# Patient Record
Sex: Female | Born: 1955
Health system: Southern US, Community
[De-identification: ages and names within clinical notes are randomized; demographics above are authoritative.]

## PROBLEM LIST (undated history)

## (undated) ENCOUNTER — Emergency Department (HOSPITAL_COMMUNITY): Admission: EM | Payer: Medicare Other

## (undated) DIAGNOSIS — E119 Type 2 diabetes mellitus without complications: Secondary | ICD-10-CM

## (undated) DIAGNOSIS — K219 Gastro-esophageal reflux disease without esophagitis: Secondary | ICD-10-CM

## (undated) DIAGNOSIS — G629 Polyneuropathy, unspecified: Secondary | ICD-10-CM

## (undated) DIAGNOSIS — I890 Lymphedema, not elsewhere classified: Secondary | ICD-10-CM

## (undated) DIAGNOSIS — R109 Unspecified abdominal pain: Secondary | ICD-10-CM

## (undated) DIAGNOSIS — E785 Hyperlipidemia, unspecified: Secondary | ICD-10-CM

## (undated) DIAGNOSIS — E041 Nontoxic single thyroid nodule: Secondary | ICD-10-CM

## (undated) DIAGNOSIS — Z9884 Bariatric surgery status: Secondary | ICD-10-CM

## (undated) DIAGNOSIS — R160 Hepatomegaly, not elsewhere classified: Secondary | ICD-10-CM

## (undated) DIAGNOSIS — Z780 Asymptomatic menopausal state: Secondary | ICD-10-CM

## (undated) DIAGNOSIS — R609 Edema, unspecified: Secondary | ICD-10-CM

## (undated) DIAGNOSIS — J019 Acute sinusitis, unspecified: Secondary | ICD-10-CM

## (undated) DIAGNOSIS — D72819 Decreased white blood cell count, unspecified: Secondary | ICD-10-CM

## (undated) DIAGNOSIS — K200 Eosinophilic esophagitis: Secondary | ICD-10-CM

## (undated) DIAGNOSIS — R51 Headache: Secondary | ICD-10-CM

## (undated) DIAGNOSIS — T7840XA Allergy, unspecified, initial encounter: Secondary | ICD-10-CM

## (undated) DIAGNOSIS — C801 Malignant (primary) neoplasm, unspecified: Secondary | ICD-10-CM

## (undated) DIAGNOSIS — Z9189 Other specified personal risk factors, not elsewhere classified: Secondary | ICD-10-CM

## (undated) DIAGNOSIS — E890 Postprocedural hypothyroidism: Secondary | ICD-10-CM

## (undated) DIAGNOSIS — G609 Hereditary and idiopathic neuropathy, unspecified: Secondary | ICD-10-CM

## (undated) DIAGNOSIS — M1990 Unspecified osteoarthritis, unspecified site: Secondary | ICD-10-CM

## (undated) DIAGNOSIS — H269 Unspecified cataract: Secondary | ICD-10-CM

## (undated) DIAGNOSIS — I10 Essential (primary) hypertension: Secondary | ICD-10-CM

## (undated) DIAGNOSIS — E669 Obesity, unspecified: Secondary | ICD-10-CM

## (undated) DIAGNOSIS — I2510 Atherosclerotic heart disease of native coronary artery without angina pectoris: Secondary | ICD-10-CM

## (undated) DIAGNOSIS — F329 Major depressive disorder, single episode, unspecified: Secondary | ICD-10-CM

## (undated) DIAGNOSIS — R0602 Shortness of breath: Secondary | ICD-10-CM

## (undated) DIAGNOSIS — N3000 Acute cystitis without hematuria: Secondary | ICD-10-CM

## (undated) DIAGNOSIS — N289 Disorder of kidney and ureter, unspecified: Secondary | ICD-10-CM

## (undated) DIAGNOSIS — R509 Fever, unspecified: Secondary | ICD-10-CM

## (undated) DIAGNOSIS — J309 Allergic rhinitis, unspecified: Secondary | ICD-10-CM

## (undated) DIAGNOSIS — K7689 Other specified diseases of liver: Secondary | ICD-10-CM

## (undated) HISTORY — DX: Other specified diseases of liver: K76.89

## (undated) HISTORY — DX: Hepatomegaly, not elsewhere classified: R16.0

## (undated) HISTORY — PX: LYMPH NODE DISSECTION: SHX5087

## (undated) HISTORY — PX: KNEE ARTHROSCOPY: SHX127

## (undated) HISTORY — DX: Unspecified cataract: H26.9

## (undated) HISTORY — DX: Postprocedural hypothyroidism: E89.0

## (undated) HISTORY — DX: Asymptomatic menopausal state: Z78.0

## (undated) HISTORY — DX: Eosinophilic esophagitis: K20.0

## (undated) HISTORY — DX: Gastro-esophageal reflux disease without esophagitis: K21.9

## (undated) HISTORY — DX: Obesity, unspecified: E66.9

## (undated) HISTORY — DX: Hereditary and idiopathic neuropathy, unspecified: G60.9

## (undated) HISTORY — DX: Unspecified abdominal pain: R10.9

## (undated) HISTORY — DX: Essential (primary) hypertension: I10

## (undated) HISTORY — PX: UPPER GASTROINTESTINAL ENDOSCOPY: SHX188

## (undated) HISTORY — DX: Acute cystitis without hematuria: N30.00

## (undated) HISTORY — DX: Headache: R51

## (undated) HISTORY — PX: BASAL CELL CARCINOMA EXCISION: SHX1214

## (undated) HISTORY — PX: OOPHORECTOMY: SHX86

## (undated) HISTORY — DX: Lymphedema, not elsewhere classified: I89.0

## (undated) HISTORY — DX: Shortness of breath: R06.02

## (undated) HISTORY — DX: Bariatric surgery status: Z98.84

## (undated) HISTORY — PX: OTHER SURGICAL HISTORY: SHX169

## (undated) HISTORY — PX: EYE SURGERY: SHX253

## (undated) HISTORY — DX: Type 2 diabetes mellitus without complications: E11.9

## (undated) HISTORY — DX: Major depressive disorder, single episode, unspecified: F32.9

## (undated) HISTORY — DX: Allergy, unspecified, initial encounter: T78.40XA

## (undated) HISTORY — DX: Unspecified osteoarthritis, unspecified site: M19.90

## (undated) HISTORY — DX: Polyneuropathy, unspecified: G62.9

## (undated) HISTORY — DX: Acute sinusitis, unspecified: J01.90

## (undated) HISTORY — DX: Nontoxic single thyroid nodule: E04.1

## (undated) HISTORY — PX: ABDOMINAL HYSTERECTOMY: SHX81

## (undated) HISTORY — PX: COLONOSCOPY: SHX174

## (undated) HISTORY — DX: Malignant (primary) neoplasm, unspecified: C80.1

## (undated) HISTORY — DX: Edema, unspecified: R60.9

## (undated) HISTORY — DX: Disorder of kidney and ureter, unspecified: N28.9

## (undated) HISTORY — DX: Hyperlipidemia, unspecified: E78.5

## (undated) HISTORY — DX: Other specified personal risk factors, not elsewhere classified: Z91.89

## (undated) HISTORY — PX: TONSILLECTOMY AND ADENOIDECTOMY: SUR1326

## (undated) HISTORY — PX: FOOT SURGERY: SHX648

## (undated) HISTORY — DX: Allergic rhinitis, unspecified: J30.9

## (undated) HISTORY — PX: THYROIDECTOMY: SHX17

## (undated) HISTORY — DX: Decreased white blood cell count, unspecified: D72.819

## (undated) HISTORY — PX: SKIN TAG REMOVAL: SHX780

## (undated) HISTORY — DX: Fever, unspecified: R50.9

## (undated) HISTORY — PX: CARDIAC CATHETERIZATION: SHX172

---

## 2001-12-11 ENCOUNTER — Encounter: Payer: Self-pay | Admitting: Gastroenterology

## 2007-03-16 ENCOUNTER — Emergency Department (HOSPITAL_COMMUNITY): Admission: EM | Admit: 2007-03-16 | Discharge: 2007-03-16 | Payer: Self-pay | Admitting: Emergency Medicine

## 2007-07-04 ENCOUNTER — Ambulatory Visit: Payer: Self-pay | Admitting: Endocrinology

## 2007-07-04 LAB — CONVERTED CEMR LAB
ALT: 112 units/L — ABNORMAL HIGH (ref 0–35)
AST: 67 units/L — ABNORMAL HIGH (ref 0–37)
Albumin: 3.6 g/dL (ref 3.5–5.2)
Alkaline Phosphatase: 124 units/L — ABNORMAL HIGH (ref 39–117)
BUN: 16 mg/dL (ref 6–23)
Basophils Absolute: 0 10*3/uL (ref 0.0–0.1)
Basophils Relative: 0.9 % (ref 0.0–1.0)
Bilirubin, Direct: 0.1 mg/dL (ref 0.0–0.3)
CO2: 28 meq/L (ref 19–32)
Calcium: 9.2 mg/dL (ref 8.4–10.5)
Chloride: 101 meq/L (ref 96–112)
Cholesterol: 265 mg/dL (ref 0–200)
Creatinine, Ser: 0.7 mg/dL (ref 0.4–1.2)
Direct LDL: 181.4 mg/dL
Eosinophils Absolute: 0.3 10*3/uL (ref 0.0–0.6)
Eosinophils Relative: 9.8 % — ABNORMAL HIGH (ref 0.0–5.0)
GFR calc Af Amer: 113 mL/min
GFR calc non Af Amer: 93 mL/min
Glucose, Bld: 264 mg/dL — ABNORMAL HIGH (ref 70–99)
HCT: 32.3 % — ABNORMAL LOW (ref 36.0–46.0)
HDL: 27.1 mg/dL — ABNORMAL LOW (ref >39.0)
Hemoglobin: 11 g/dL — ABNORMAL LOW (ref 12.0–15.0)
Hgb A1c MFr Bld: 11.8 % — ABNORMAL HIGH (ref 4.6–6.0)
Lymphocytes Relative: 24.6 % (ref 12.0–46.0)
MCHC: 34 g/dL (ref 30.0–36.0)
MCV: 82.8 fL (ref 78.0–100.0)
Monocytes Absolute: 0.3 10*3/uL (ref 0.2–0.7)
Monocytes Relative: 8.6 % (ref 3.0–11.0)
Neutro Abs: 1.7 10*3/uL (ref 1.4–7.7)
Neutrophils Relative %: 56.1 % (ref 43.0–77.0)
Platelets: 193 10*3/uL (ref 150–400)
Potassium: 3.8 meq/L (ref 3.5–5.1)
RBC: 3.91 M/uL (ref 3.87–5.11)
RDW: 14 % (ref 11.5–14.6)
Sodium: 137 meq/L (ref 135–145)
TSH: 1.2 microintl units/mL (ref 0.35–5.50)
Total Bilirubin: 0.7 mg/dL (ref 0.3–1.2)
Total CHOL/HDL Ratio: 9.8
Total Protein: 6.3 g/dL (ref 6.0–8.3)
Triglycerides: 239 mg/dL (ref 0–149)
VLDL: 48 mg/dL — ABNORMAL HIGH (ref 0–40)
WBC: 3.1 10*3/uL — ABNORMAL LOW (ref 4.5–10.5)

## 2007-07-08 ENCOUNTER — Encounter: Payer: Self-pay | Admitting: Endocrinology

## 2007-07-08 DIAGNOSIS — I10 Essential (primary) hypertension: Secondary | ICD-10-CM

## 2007-07-08 DIAGNOSIS — E785 Hyperlipidemia, unspecified: Secondary | ICD-10-CM

## 2007-07-08 DIAGNOSIS — E1140 Type 2 diabetes mellitus with diabetic neuropathy, unspecified: Secondary | ICD-10-CM | POA: Insufficient documentation

## 2007-07-08 DIAGNOSIS — E1169 Type 2 diabetes mellitus with other specified complication: Secondary | ICD-10-CM | POA: Insufficient documentation

## 2007-07-08 DIAGNOSIS — E119 Type 2 diabetes mellitus without complications: Secondary | ICD-10-CM

## 2007-07-08 HISTORY — DX: Hyperlipidemia, unspecified: E78.5

## 2007-07-08 HISTORY — DX: Essential (primary) hypertension: I10

## 2007-07-08 HISTORY — DX: Type 2 diabetes mellitus without complications: E11.9

## 2007-07-11 ENCOUNTER — Encounter: Admission: RE | Admit: 2007-07-11 | Discharge: 2007-07-11 | Payer: Self-pay | Admitting: Endocrinology

## 2007-07-25 ENCOUNTER — Encounter: Admission: RE | Admit: 2007-07-25 | Discharge: 2007-07-25 | Payer: Self-pay | Admitting: Endocrinology

## 2007-07-31 ENCOUNTER — Encounter: Payer: Self-pay | Admitting: *Deleted

## 2007-08-01 ENCOUNTER — Ambulatory Visit: Payer: Self-pay | Admitting: Endocrinology

## 2007-08-15 ENCOUNTER — Ambulatory Visit: Payer: Self-pay | Admitting: Endocrinology

## 2007-08-29 ENCOUNTER — Ambulatory Visit: Payer: Self-pay | Admitting: Endocrinology

## 2007-08-29 ENCOUNTER — Telehealth (INDEPENDENT_AMBULATORY_CARE_PROVIDER_SITE_OTHER): Payer: Self-pay | Admitting: *Deleted

## 2007-09-12 ENCOUNTER — Ambulatory Visit: Payer: Self-pay | Admitting: Gastroenterology

## 2007-10-03 ENCOUNTER — Encounter: Admission: RE | Admit: 2007-10-03 | Discharge: 2007-10-03 | Payer: Self-pay | Admitting: Orthopedic Surgery

## 2007-10-03 ENCOUNTER — Ambulatory Visit: Payer: Self-pay | Admitting: Endocrinology

## 2007-11-21 ENCOUNTER — Ambulatory Visit: Payer: Self-pay | Admitting: Internal Medicine

## 2007-11-21 DIAGNOSIS — J309 Allergic rhinitis, unspecified: Secondary | ICD-10-CM

## 2007-11-21 DIAGNOSIS — F329 Major depressive disorder, single episode, unspecified: Secondary | ICD-10-CM

## 2007-11-21 DIAGNOSIS — J019 Acute sinusitis, unspecified: Secondary | ICD-10-CM | POA: Insufficient documentation

## 2007-11-21 DIAGNOSIS — E1140 Type 2 diabetes mellitus with diabetic neuropathy, unspecified: Secondary | ICD-10-CM | POA: Insufficient documentation

## 2007-11-21 DIAGNOSIS — F3289 Other specified depressive episodes: Secondary | ICD-10-CM

## 2007-11-21 DIAGNOSIS — F325 Major depressive disorder, single episode, in full remission: Secondary | ICD-10-CM | POA: Insufficient documentation

## 2007-11-21 DIAGNOSIS — G609 Hereditary and idiopathic neuropathy, unspecified: Secondary | ICD-10-CM

## 2007-11-21 HISTORY — DX: Acute sinusitis, unspecified: J01.90

## 2007-11-21 HISTORY — DX: Other specified depressive episodes: F32.89

## 2007-11-21 HISTORY — DX: Hereditary and idiopathic neuropathy, unspecified: G60.9

## 2007-11-21 HISTORY — DX: Allergic rhinitis, unspecified: J30.9

## 2007-11-21 HISTORY — DX: Major depressive disorder, single episode, unspecified: F32.9

## 2007-11-24 ENCOUNTER — Encounter: Payer: Self-pay | Admitting: Endocrinology

## 2007-12-26 ENCOUNTER — Ambulatory Visit: Payer: Self-pay | Admitting: Endocrinology

## 2008-01-02 ENCOUNTER — Encounter: Payer: Self-pay | Admitting: Endocrinology

## 2008-01-06 DIAGNOSIS — M19041 Primary osteoarthritis, right hand: Secondary | ICD-10-CM | POA: Insufficient documentation

## 2008-01-06 DIAGNOSIS — M19042 Primary osteoarthritis, left hand: Secondary | ICD-10-CM | POA: Insufficient documentation

## 2008-01-06 DIAGNOSIS — E669 Obesity, unspecified: Secondary | ICD-10-CM | POA: Insufficient documentation

## 2008-01-06 DIAGNOSIS — R160 Hepatomegaly, not elsewhere classified: Secondary | ICD-10-CM | POA: Insufficient documentation

## 2008-01-06 DIAGNOSIS — K7689 Other specified diseases of liver: Secondary | ICD-10-CM

## 2008-01-06 DIAGNOSIS — M1990 Unspecified osteoarthritis, unspecified site: Secondary | ICD-10-CM

## 2008-01-06 DIAGNOSIS — K7581 Nonalcoholic steatohepatitis (NASH): Secondary | ICD-10-CM | POA: Insufficient documentation

## 2008-01-06 HISTORY — DX: Obesity, unspecified: E66.9

## 2008-01-06 HISTORY — DX: Other specified diseases of liver: K76.89

## 2008-01-06 HISTORY — DX: Unspecified osteoarthritis, unspecified site: M19.90

## 2008-01-06 HISTORY — DX: Hepatomegaly, not elsewhere classified: R16.0

## 2008-01-09 ENCOUNTER — Ambulatory Visit: Payer: Self-pay | Admitting: Endocrinology

## 2008-01-20 LAB — CONVERTED CEMR LAB
BUN: 20 mg/dL (ref 6–23)
CO2: 27 meq/L (ref 19–32)
Calcium: 9.4 mg/dL (ref 8.4–10.5)
Chloride: 98 meq/L (ref 96–112)
Creatinine, Ser: 0.8 mg/dL (ref 0.4–1.2)
GFR calc Af Amer: 96 mL/min
GFR calc non Af Amer: 80 mL/min
Glucose, Bld: 404 mg/dL — ABNORMAL HIGH (ref 70–99)
Potassium: 4.5 meq/L (ref 3.5–5.1)
Sodium: 134 meq/L — ABNORMAL LOW (ref 135–145)

## 2008-02-13 ENCOUNTER — Ambulatory Visit: Payer: Self-pay | Admitting: Endocrinology

## 2008-02-13 DIAGNOSIS — K200 Eosinophilic esophagitis: Secondary | ICD-10-CM

## 2008-02-13 HISTORY — DX: Eosinophilic esophagitis: K20.0

## 2008-03-15 ENCOUNTER — Encounter: Payer: Self-pay | Admitting: Endocrinology

## 2008-03-26 ENCOUNTER — Telehealth: Payer: Self-pay | Admitting: Gastroenterology

## 2008-03-26 ENCOUNTER — Ambulatory Visit: Payer: Self-pay | Admitting: Gastroenterology

## 2008-04-08 ENCOUNTER — Encounter: Payer: Self-pay | Admitting: Gastroenterology

## 2008-04-08 LAB — HM COLONOSCOPY: HM Colonoscopy: NORMAL

## 2008-04-09 ENCOUNTER — Encounter (INDEPENDENT_AMBULATORY_CARE_PROVIDER_SITE_OTHER): Payer: Self-pay | Admitting: Obstetrics and Gynecology

## 2008-04-09 ENCOUNTER — Ambulatory Visit (HOSPITAL_COMMUNITY): Admission: RE | Admit: 2008-04-09 | Discharge: 2008-04-09 | Payer: Self-pay | Admitting: Obstetrics and Gynecology

## 2008-05-19 ENCOUNTER — Emergency Department (HOSPITAL_BASED_OUTPATIENT_CLINIC_OR_DEPARTMENT_OTHER): Admission: EM | Admit: 2008-05-19 | Discharge: 2008-05-19 | Payer: Self-pay | Admitting: Emergency Medicine

## 2008-05-19 ENCOUNTER — Telehealth (INDEPENDENT_AMBULATORY_CARE_PROVIDER_SITE_OTHER): Payer: Self-pay | Admitting: *Deleted

## 2008-05-20 ENCOUNTER — Ambulatory Visit: Payer: Self-pay | Admitting: Endocrinology

## 2008-05-21 ENCOUNTER — Telehealth (INDEPENDENT_AMBULATORY_CARE_PROVIDER_SITE_OTHER): Payer: Self-pay | Admitting: *Deleted

## 2008-07-12 ENCOUNTER — Telehealth (INDEPENDENT_AMBULATORY_CARE_PROVIDER_SITE_OTHER): Payer: Self-pay | Admitting: *Deleted

## 2008-07-14 ENCOUNTER — Telehealth (INDEPENDENT_AMBULATORY_CARE_PROVIDER_SITE_OTHER): Payer: Self-pay | Admitting: *Deleted

## 2008-07-16 ENCOUNTER — Ambulatory Visit: Payer: Self-pay | Admitting: Endocrinology

## 2008-07-30 ENCOUNTER — Ambulatory Visit: Payer: Self-pay | Admitting: Endocrinology

## 2008-08-13 ENCOUNTER — Ambulatory Visit (HOSPITAL_COMMUNITY): Admission: RE | Admit: 2008-08-13 | Discharge: 2008-08-13 | Payer: Self-pay | Admitting: Obstetrics and Gynecology

## 2008-08-30 ENCOUNTER — Telehealth (INDEPENDENT_AMBULATORY_CARE_PROVIDER_SITE_OTHER): Payer: Self-pay | Admitting: *Deleted

## 2008-09-14 ENCOUNTER — Telehealth (INDEPENDENT_AMBULATORY_CARE_PROVIDER_SITE_OTHER): Payer: Self-pay | Admitting: *Deleted

## 2008-09-27 ENCOUNTER — Ambulatory Visit: Payer: Self-pay | Admitting: Endocrinology

## 2008-09-29 ENCOUNTER — Ambulatory Visit: Payer: Self-pay | Admitting: Endocrinology

## 2008-09-29 DIAGNOSIS — D72819 Decreased white blood cell count, unspecified: Secondary | ICD-10-CM

## 2008-09-29 DIAGNOSIS — Z780 Asymptomatic menopausal state: Secondary | ICD-10-CM

## 2008-09-29 HISTORY — DX: Decreased white blood cell count, unspecified: D72.819

## 2008-09-29 HISTORY — DX: Asymptomatic menopausal state: Z78.0

## 2008-09-29 LAB — CONVERTED CEMR LAB
ALT: 145 units/L — ABNORMAL HIGH (ref 0–35)
AST: 105 units/L — ABNORMAL HIGH (ref 0–37)
Albumin: 3.5 g/dL (ref 3.5–5.2)
Alkaline Phosphatase: 97 units/L (ref 39–117)
BUN: 12 mg/dL (ref 6–23)
Basophils Absolute: 0 10*3/uL (ref 0.0–0.1)
Basophils Relative: 0.4 % (ref 0.0–3.0)
Bilirubin Urine: NEGATIVE
Bilirubin, Direct: 0.1 mg/dL (ref 0.0–0.3)
CO2: 30 meq/L (ref 19–32)
Calcium: 9.2 mg/dL (ref 8.4–10.5)
Chloride: 102 meq/L (ref 96–112)
Cholesterol: 264 mg/dL (ref 0–200)
Creatinine, Ser: 0.8 mg/dL (ref 0.4–1.2)
Creatinine,U: 57.6 mg/dL
Direct LDL: 185.3 mg/dL
Eosinophils Absolute: 0.1 10*3/uL (ref 0.0–0.7)
Eosinophils Relative: 4 % (ref 0.0–5.0)
GFR calc Af Amer: 96 mL/min
GFR calc non Af Amer: 79 mL/min
Glucose, Bld: 143 mg/dL — ABNORMAL HIGH (ref 70–99)
HCT: 36.1 % (ref 36.0–46.0)
HDL: 27.2 mg/dL — ABNORMAL LOW (ref >39.0)
Hemoglobin, Urine: NEGATIVE
Hemoglobin: 12.5 g/dL (ref 12.0–15.0)
Hgb A1c MFr Bld: 8.4 % — ABNORMAL HIGH (ref 4.6–6.0)
Iron: 65 ug/dL (ref 42–145)
Ketones, ur: NEGATIVE mg/dL
Leukocytes, UA: NEGATIVE
Lymphocytes Relative: 27 % (ref 12.0–46.0)
MCHC: 34.5 g/dL (ref 30.0–36.0)
MCV: 91 fL (ref 78.0–100.0)
Microalb Creat Ratio: 6.9 mg/g (ref 0.0–30.0)
Microalb, Ur: 0.4 mg/dL (ref 0.0–1.9)
Monocytes Absolute: 0.4 10*3/uL (ref 0.1–1.0)
Monocytes Relative: 10 % (ref 3.0–12.0)
Neutro Abs: 2.1 10*3/uL (ref 1.4–7.7)
Neutrophils Relative %: 58.6 % (ref 43.0–77.0)
Nitrite: NEGATIVE
Platelets: 137 10*3/uL — ABNORMAL LOW (ref 150–400)
Potassium: 4.3 meq/L (ref 3.5–5.1)
RBC: 3.97 M/uL (ref 3.87–5.11)
RDW: 13.1 % (ref 11.5–14.6)
Saturation Ratios: 15.7 % — ABNORMAL LOW (ref 20.0–50.0)
Sodium: 139 meq/L (ref 135–145)
Specific Gravity, Urine: 1.01 (ref 1.000–1.03)
TSH: 1.51 microintl units/mL (ref 0.35–5.50)
Total Bilirubin: 0.7 mg/dL (ref 0.3–1.2)
Total CHOL/HDL Ratio: 9.7
Total Protein, Urine: NEGATIVE mg/dL
Total Protein: 6.2 g/dL (ref 6.0–8.3)
Transferrin: 296.5 mg/dL (ref >=212.0)
Triglycerides: 157 mg/dL — ABNORMAL HIGH (ref 0–149)
Urine Glucose: NEGATIVE mg/dL
Urobilinogen, UA: 0.2 (ref 0.0–1.0)
VLDL: 31 mg/dL (ref 0–40)
Vitamin B-12: >1500 pg/mL — ABNORMAL HIGH (ref 211–911)
WBC: 3.6 10*3/uL — ABNORMAL LOW (ref 4.5–10.5)
pH: 6 (ref 5.0–8.0)

## 2008-09-30 ENCOUNTER — Encounter: Payer: Self-pay | Admitting: Endocrinology

## 2008-10-18 ENCOUNTER — Inpatient Hospital Stay (HOSPITAL_COMMUNITY): Admission: RE | Admit: 2008-10-18 | Discharge: 2008-10-22 | Payer: Self-pay | Admitting: Orthopedic Surgery

## 2008-11-15 ENCOUNTER — Telehealth (INDEPENDENT_AMBULATORY_CARE_PROVIDER_SITE_OTHER): Payer: Self-pay | Admitting: *Deleted

## 2008-12-09 ENCOUNTER — Ambulatory Visit: Payer: Self-pay | Admitting: Endocrinology

## 2008-12-31 ENCOUNTER — Encounter: Payer: Self-pay | Admitting: Endocrinology

## 2009-01-07 ENCOUNTER — Ambulatory Visit: Payer: Self-pay | Admitting: Internal Medicine

## 2009-01-07 ENCOUNTER — Encounter: Payer: Self-pay | Admitting: Endocrinology

## 2009-01-14 ENCOUNTER — Telehealth: Payer: Self-pay | Admitting: Endocrinology

## 2009-02-10 ENCOUNTER — Telehealth (INDEPENDENT_AMBULATORY_CARE_PROVIDER_SITE_OTHER): Payer: Self-pay | Admitting: *Deleted

## 2009-02-18 ENCOUNTER — Ambulatory Visit: Payer: Self-pay | Admitting: Endocrinology

## 2009-05-20 ENCOUNTER — Ambulatory Visit: Payer: Self-pay | Admitting: Endocrinology

## 2009-05-20 DIAGNOSIS — R0602 Shortness of breath: Secondary | ICD-10-CM | POA: Insufficient documentation

## 2009-05-20 DIAGNOSIS — R609 Edema, unspecified: Secondary | ICD-10-CM

## 2009-05-20 DIAGNOSIS — R635 Abnormal weight gain: Secondary | ICD-10-CM | POA: Insufficient documentation

## 2009-05-20 HISTORY — DX: Edema, unspecified: R60.9

## 2009-05-20 HISTORY — DX: Shortness of breath: R06.02

## 2009-05-20 LAB — CONVERTED CEMR LAB
ALT: 66 units/L — ABNORMAL HIGH (ref 0–35)
AST: 56 units/L — ABNORMAL HIGH (ref 0–37)
Albumin: 3.9 g/dL (ref 3.5–5.2)
Alkaline Phosphatase: 103 units/L (ref 39–117)
BUN: 23 mg/dL (ref 6–23)
Bilirubin, Direct: 0.1 mg/dL (ref 0.0–0.3)
CO2: 29 meq/L (ref 19–32)
Calcium: 9.3 mg/dL (ref 8.4–10.5)
Chloride: 102 meq/L (ref 96–112)
Cholesterol: 303 mg/dL — ABNORMAL HIGH (ref 0–200)
Creatinine, Ser: 1 mg/dL (ref 0.4–1.2)
Direct LDL: 224.9 mg/dL
GFR calc non Af Amer: 61.17 mL/min (ref >60)
Glucose, Bld: 179 mg/dL — ABNORMAL HIGH (ref 70–99)
HDL: 33.1 mg/dL — ABNORMAL LOW (ref >39.00)
Hgb A1c MFr Bld: 8.5 % — ABNORMAL HIGH (ref 4.6–6.5)
Potassium: 4 meq/L (ref 3.5–5.1)
Pro B Natriuretic peptide (BNP): 12 pg/mL (ref 0.0–100.0)
Sodium: 138 meq/L (ref 135–145)
TSH: 1.55 microintl units/mL (ref 0.35–5.50)
Total Bilirubin: 0.9 mg/dL (ref 0.3–1.2)
Total CHOL/HDL Ratio: 9
Total Protein: 6.8 g/dL (ref 6.0–8.3)
Triglycerides: 215 mg/dL — ABNORMAL HIGH (ref 0.0–149.0)
VLDL: 43 mg/dL — ABNORMAL HIGH (ref 0.0–40.0)

## 2009-05-21 ENCOUNTER — Ambulatory Visit (HOSPITAL_COMMUNITY): Admission: RE | Admit: 2009-05-21 | Discharge: 2009-05-21 | Payer: Self-pay | Admitting: Endocrinology

## 2009-05-21 ENCOUNTER — Telehealth: Payer: Self-pay | Admitting: Endocrinology

## 2009-07-15 ENCOUNTER — Ambulatory Visit: Payer: Self-pay | Admitting: Endocrinology

## 2009-07-15 LAB — CONVERTED CEMR LAB: Hgb A1c MFr Bld: 9.1 % — ABNORMAL HIGH (ref 4.6–6.5)

## 2009-07-29 ENCOUNTER — Telehealth: Payer: Self-pay | Admitting: Endocrinology

## 2009-08-16 ENCOUNTER — Telehealth: Payer: Self-pay | Admitting: Endocrinology

## 2009-08-17 ENCOUNTER — Ambulatory Visit: Payer: Self-pay | Admitting: Endocrinology

## 2009-08-17 DIAGNOSIS — N3000 Acute cystitis without hematuria: Secondary | ICD-10-CM | POA: Insufficient documentation

## 2009-08-17 HISTORY — DX: Acute cystitis without hematuria: N30.00

## 2009-08-17 LAB — CONVERTED CEMR LAB
Blood in Urine, dipstick: NEGATIVE
Glucose, Urine, Semiquant: NEGATIVE
Nitrite: NEGATIVE
Specific Gravity, Urine: 1.02
Urobilinogen, UA: 0.2
pH: 5

## 2009-08-30 ENCOUNTER — Ambulatory Visit (HOSPITAL_COMMUNITY): Admission: RE | Admit: 2009-08-30 | Discharge: 2009-08-30 | Payer: Self-pay | Admitting: Obstetrics and Gynecology

## 2009-10-18 ENCOUNTER — Telehealth: Payer: Self-pay | Admitting: Endocrinology

## 2009-10-20 ENCOUNTER — Ambulatory Visit: Payer: Self-pay | Admitting: Endocrinology

## 2009-10-20 DIAGNOSIS — R509 Fever, unspecified: Secondary | ICD-10-CM | POA: Insufficient documentation

## 2009-10-20 DIAGNOSIS — R109 Unspecified abdominal pain: Secondary | ICD-10-CM

## 2009-10-20 DIAGNOSIS — R1084 Generalized abdominal pain: Secondary | ICD-10-CM | POA: Insufficient documentation

## 2009-10-20 DIAGNOSIS — R82998 Other abnormal findings in urine: Secondary | ICD-10-CM | POA: Insufficient documentation

## 2009-10-20 HISTORY — DX: Fever, unspecified: R50.9

## 2009-10-20 HISTORY — DX: Unspecified abdominal pain: R10.9

## 2009-10-21 ENCOUNTER — Encounter: Payer: Self-pay | Admitting: Endocrinology

## 2009-10-26 ENCOUNTER — Telehealth: Payer: Self-pay | Admitting: Endocrinology

## 2009-11-12 HISTORY — PX: ROUX-EN-Y GASTRIC BYPASS: SHX1104

## 2009-11-14 LAB — CONVERTED CEMR LAB
ALT: 47 units/L — ABNORMAL HIGH (ref 0–35)
AST: 40 units/L — ABNORMAL HIGH (ref 0–37)
Albumin: 4.2 g/dL (ref 3.5–5.2)
Alkaline Phosphatase: 103 units/L (ref 39–117)
Amylase: 35 units/L (ref 27–131)
BUN: 20 mg/dL (ref 6–23)
Basophils Absolute: 0 10*3/uL (ref 0.0–0.1)
Basophils Relative: 1 % (ref 0.0–3.0)
Bilirubin Urine: NEGATIVE
Bilirubin, Direct: 0.1 mg/dL (ref 0.0–0.3)
CO2: 27 meq/L (ref 19–32)
Calcium: 9.5 mg/dL (ref 8.4–10.5)
Chloride: 103 meq/L (ref 96–112)
Cholesterol: 306 mg/dL — ABNORMAL HIGH (ref 0–200)
Creatinine, Ser: 0.8 mg/dL (ref 0.4–1.2)
Creatinine,U: 106.4 mg/dL
Direct LDL: 214.3 mg/dL
Eosinophils Absolute: 0.2 10*3/uL (ref 0.0–0.7)
Eosinophils Relative: 4 % (ref 0.0–5.0)
GFR calc non Af Amer: 79.02 mL/min (ref >60)
Glucose, Bld: 147 mg/dL — ABNORMAL HIGH (ref 70–99)
HCT: 37.6 % (ref 36.0–46.0)
HDL: 37.4 mg/dL — ABNORMAL LOW (ref >39.00)
Hemoglobin, Urine: NEGATIVE
Hemoglobin: 12.8 g/dL (ref 12.0–15.0)
Hgb A1c MFr Bld: 7 % — ABNORMAL HIGH (ref 4.6–6.5)
Ketones, ur: NEGATIVE mg/dL
Lipase: 24 units/L (ref 11.0–59.0)
Lymphocytes Relative: 25.5 % (ref 12.0–46.0)
Lymphs Abs: 1 10*3/uL (ref 0.7–4.0)
MCHC: 34.1 g/dL (ref 30.0–36.0)
MCV: 94.9 fL (ref 78.0–100.0)
Microalb Creat Ratio: 4.7 mg/g (ref 0.0–30.0)
Microalb, Ur: 0.5 mg/dL (ref 0.0–1.9)
Monocytes Absolute: 0.3 10*3/uL (ref 0.1–1.0)
Monocytes Relative: 6.7 % (ref 3.0–12.0)
Neutro Abs: 2.3 10*3/uL (ref 1.4–7.7)
Neutrophils Relative %: 62.8 % (ref 43.0–77.0)
Nitrite: POSITIVE
Platelets: 200 10*3/uL (ref 150.0–400.0)
Potassium: 3.5 meq/L (ref 3.5–5.1)
RBC: 3.96 M/uL (ref 3.87–5.11)
RDW: 14.2 % (ref 11.5–14.6)
Sed Rate: 38 mm/hr — ABNORMAL HIGH (ref 0–22)
Sodium: 139 meq/L (ref 135–145)
Specific Gravity, Urine: 1.02 (ref 1.000–1.030)
TSH: 1.76 microintl units/mL (ref 0.35–5.50)
Total Bilirubin: 0.8 mg/dL (ref 0.3–1.2)
Total CHOL/HDL Ratio: 8
Total Protein, Urine: NEGATIVE mg/dL
Total Protein: 7.2 g/dL (ref 6.0–8.3)
Triglycerides: 293 mg/dL — ABNORMAL HIGH (ref 0.0–149.0)
Urine Glucose: NEGATIVE mg/dL
Urobilinogen, UA: 0.2 (ref 0.0–1.0)
VLDL: 58.6 mg/dL — ABNORMAL HIGH (ref 0.0–40.0)
WBC: 3.8 10*3/uL — ABNORMAL LOW (ref 4.5–10.5)
pH: 5.5 (ref 5.0–8.0)

## 2009-12-01 ENCOUNTER — Telehealth: Payer: Self-pay | Admitting: Endocrinology

## 2009-12-02 ENCOUNTER — Ambulatory Visit: Payer: Self-pay | Admitting: Internal Medicine

## 2009-12-02 ENCOUNTER — Encounter: Payer: Self-pay | Admitting: Endocrinology

## 2009-12-02 LAB — CONVERTED CEMR LAB
Bilirubin Urine: NEGATIVE
Blood in Urine, dipstick: NEGATIVE
Glucose, Urine, Semiquant: NEGATIVE
Ketones, urine, test strip: NEGATIVE
Nitrite: NEGATIVE
Protein, U semiquant: NEGATIVE
Specific Gravity, Urine: 1.015
Urobilinogen, UA: 0.2
WBC Urine, dipstick: NEGATIVE
pH: 5

## 2010-01-04 ENCOUNTER — Encounter: Payer: Self-pay | Admitting: Endocrinology

## 2010-01-06 ENCOUNTER — Encounter: Payer: Self-pay | Admitting: Endocrinology

## 2010-01-10 ENCOUNTER — Telehealth (INDEPENDENT_AMBULATORY_CARE_PROVIDER_SITE_OTHER): Payer: Self-pay | Admitting: *Deleted

## 2010-01-20 ENCOUNTER — Ambulatory Visit: Payer: Self-pay | Admitting: Endocrinology

## 2010-01-20 LAB — CONVERTED CEMR LAB
ALT: 53 units/L — ABNORMAL HIGH (ref 0–35)
AST: 29 units/L (ref 0–37)
Albumin: 3.7 g/dL (ref 3.5–5.2)
Alkaline Phosphatase: 92 units/L (ref 39–117)
Bilirubin, Direct: 0.2 mg/dL (ref 0.0–0.3)
Creatinine,U: 115.7 mg/dL
Hgb A1c MFr Bld: 8.1 % — ABNORMAL HIGH (ref 4.6–6.5)
Microalb Creat Ratio: 5.2 mg/g (ref 0.0–30.0)
Microalb, Ur: 0.6 mg/dL (ref 0.0–1.9)
Sed Rate: 59 mm/hr — ABNORMAL HIGH (ref 0–22)
Total Bilirubin: 0.9 mg/dL (ref 0.3–1.2)
Total Protein: 6.7 g/dL (ref 6.0–8.3)

## 2010-01-22 ENCOUNTER — Encounter: Payer: Self-pay | Admitting: Endocrinology

## 2010-02-01 ENCOUNTER — Encounter: Payer: Self-pay | Admitting: Endocrinology

## 2010-02-01 ENCOUNTER — Telehealth (INDEPENDENT_AMBULATORY_CARE_PROVIDER_SITE_OTHER): Payer: Self-pay | Admitting: *Deleted

## 2010-02-02 ENCOUNTER — Encounter: Payer: Self-pay | Admitting: Endocrinology

## 2010-02-06 ENCOUNTER — Ambulatory Visit: Payer: Self-pay | Admitting: Infectious Disease

## 2010-02-06 DIAGNOSIS — R51 Headache: Secondary | ICD-10-CM

## 2010-02-06 DIAGNOSIS — R7611 Nonspecific reaction to tuberculin skin test without active tuberculosis: Secondary | ICD-10-CM | POA: Insufficient documentation

## 2010-02-06 DIAGNOSIS — R519 Headache, unspecified: Secondary | ICD-10-CM | POA: Insufficient documentation

## 2010-02-06 HISTORY — DX: Headache: R51

## 2010-02-06 LAB — CONVERTED CEMR LAB
Crypto Ag: NEGATIVE
HIV 1 RNA Quant: <48 copies/mL (ref <48)
HIV-1 RNA Quant, Log: <1.68 (ref <1.68)

## 2010-02-07 ENCOUNTER — Encounter: Payer: Self-pay | Admitting: Infectious Disease

## 2010-02-07 LAB — CONVERTED CEMR LAB
ALT: 41 units/L — ABNORMAL HIGH (ref 0–35)
AST: 34 units/L (ref 0–37)
Albumin ELP: 59.6 % (ref 55.8–66.1)
Albumin: 4.2 g/dL (ref 3.5–5.2)
Alkaline Phosphatase: 96 units/L (ref 39–117)
Alpha-1-Globulin: 6.7 % — ABNORMAL HIGH (ref 2.9–4.9)
Alpha-2-Globulin: 12.8 % — ABNORMAL HIGH (ref 7.1–11.8)
Angiotensin 1 Converting Enzyme: 25 units/L (ref 9–67)
Anti Nuclear Antibody(ANA): NEGATIVE
BUN: 20 mg/dL (ref 6–23)
Basophils Absolute: 0 10*3/uL (ref 0.0–0.1)
Basophils Relative: 1 % (ref 0–1)
Beta Globulin: 7.1 % (ref 4.7–7.2)
CMV IgM: <8 (ref <30.0)
CO2: 22 meq/L (ref 19–32)
CRP: 0.3 mg/dL (ref <0.6)
Calcium: 9.4 mg/dL (ref 8.4–10.5)
Chloride: 103 meq/L (ref 96–112)
Creatinine, Ser: 0.92 mg/dL (ref 0.40–1.20)
Cytomegalovirus Ab-IgG: POSITIVE — AB
EBV NA IgG: 4.1 — ABNORMAL HIGH
EBV VCA IgG: 3.85 — ABNORMAL HIGH
EBV VCA IgM: 0
ENA SM Ab Ser-aCnc: <0.2 (ref <1.0)
Eosinophils Absolute: 0.1 10*3/uL (ref 0.0–0.7)
Eosinophils Relative: 5 % (ref 0–5)
Ferritin: 23 ng/mL (ref 10–291)
Gamma Globulin: 9.8 % — ABNORMAL LOW (ref 11.1–18.8)
Glucose, Bld: 171 mg/dL — ABNORMAL HIGH (ref 70–99)
HCT: 36 % (ref 36.0–46.0)
HCV Ab: NEGATIVE
Hemoglobin: 11.3 g/dL — ABNORMAL LOW (ref 12.0–15.0)
Hep A Total Ab: NEGATIVE
Hep B Core Total Ab: NEGATIVE
Hep B S Ab: POSITIVE — AB
Hepatitis B Surface Ag: NEGATIVE
IgA: 176 mg/dL (ref 68–378)
IgG (Immunoglobin G), Serum: 581 mg/dL — ABNORMAL LOW (ref 694–1618)
IgM, Serum: 75 mg/dL (ref 60–263)
LDH: 194 units/L (ref 94–250)
Lymphocytes Relative: 30 % (ref 12–46)
Lymphs Abs: 0.9 10*3/uL (ref 0.7–4.0)
MCHC: 31.4 g/dL (ref 30.0–36.0)
MCV: 95 fL (ref >=78.0)
Monocytes Absolute: 0.3 10*3/uL (ref 0.1–1.0)
Monocytes Relative: 9 % (ref 3–12)
Neutro Abs: 1.6 10*3/uL — ABNORMAL LOW (ref 1.7–7.7)
Neutrophils Relative %: 57 % (ref 43–77)
Platelets: 153 10*3/uL (ref 150–400)
Potassium: 4.4 meq/L (ref 3.5–5.3)
RBC: 3.79 M/uL — ABNORMAL LOW (ref 3.87–5.11)
RDW: 15.7 % — ABNORMAL HIGH (ref 11.5–15.5)
Rhuematoid fact SerPl-aCnc: <20 intl units/mL (ref 0–20)
Scleroderma (Scl-70) (ENA) Antibody, IgG: <0.2 (ref <1.0)
Sed Rate: 17 mm/hr (ref 0–22)
Sodium: 139 meq/L (ref 135–145)
Total Bilirubin: 0.5 mg/dL (ref 0.3–1.2)
Total CK: 55 units/L (ref 7–177)
Total Protein, Serum Electrophoresis: 6.1 g/dL (ref 6.0–8.3)
Total Protein: 6.1 g/dL (ref 6.0–8.3)
WBC: 2.9 10*3/uL — ABNORMAL LOW (ref 4.0–10.5)
ds DNA Ab: <1 (ref <5)

## 2010-02-16 ENCOUNTER — Encounter: Payer: Self-pay | Admitting: Infectious Disease

## 2010-02-17 ENCOUNTER — Ambulatory Visit: Payer: Self-pay | Admitting: Interventional Radiology

## 2010-02-17 ENCOUNTER — Ambulatory Visit (HOSPITAL_BASED_OUTPATIENT_CLINIC_OR_DEPARTMENT_OTHER): Admission: RE | Admit: 2010-02-17 | Discharge: 2010-02-17 | Payer: Self-pay | Admitting: Infectious Disease

## 2010-02-21 ENCOUNTER — Encounter: Payer: Self-pay | Admitting: Endocrinology

## 2010-02-27 ENCOUNTER — Encounter: Payer: Self-pay | Admitting: Endocrinology

## 2010-03-07 ENCOUNTER — Ambulatory Visit: Payer: Self-pay | Admitting: Endocrinology

## 2010-03-09 ENCOUNTER — Telehealth: Payer: Self-pay | Admitting: Endocrinology

## 2010-03-09 ENCOUNTER — Ambulatory Visit: Payer: Self-pay | Admitting: Infectious Disease

## 2010-03-09 DIAGNOSIS — E041 Nontoxic single thyroid nodule: Secondary | ICD-10-CM

## 2010-03-09 DIAGNOSIS — Z9189 Other specified personal risk factors, not elsewhere classified: Secondary | ICD-10-CM | POA: Insufficient documentation

## 2010-03-09 HISTORY — DX: Other specified personal risk factors, not elsewhere classified: Z91.89

## 2010-03-09 HISTORY — DX: Nontoxic single thyroid nodule: E04.1

## 2010-04-03 ENCOUNTER — Encounter: Payer: Self-pay | Admitting: Endocrinology

## 2010-04-07 ENCOUNTER — Ambulatory Visit: Payer: Self-pay | Admitting: Endocrinology

## 2010-04-07 DIAGNOSIS — L600 Ingrowing nail: Secondary | ICD-10-CM | POA: Insufficient documentation

## 2010-04-07 LAB — CONVERTED CEMR LAB
ALT: 36 units/L — ABNORMAL HIGH (ref 0–35)
AST: 34 units/L (ref 0–37)
Albumin: 4.3 g/dL (ref 3.5–5.2)
Alkaline Phosphatase: 131 units/L — ABNORMAL HIGH (ref 39–117)
Bilirubin, Direct: 0.2 mg/dL (ref 0.0–0.3)
Cholesterol: 98 mg/dL (ref 0–200)
HDL: 23.4 mg/dL — ABNORMAL LOW (ref >39.00)
Hgb A1c MFr Bld: 6.8 % — ABNORMAL HIGH (ref 4.6–6.5)
LDL Cholesterol: 48 mg/dL (ref 0–99)
Total Bilirubin: 0.8 mg/dL (ref 0.3–1.2)
Total CHOL/HDL Ratio: 4
Total Protein: 6.7 g/dL (ref 6.0–8.3)
Triglycerides: 133 mg/dL (ref 0.0–149.0)
VLDL: 26.6 mg/dL (ref 0.0–40.0)

## 2010-04-17 ENCOUNTER — Telehealth: Payer: Self-pay | Admitting: Endocrinology

## 2010-04-24 ENCOUNTER — Ambulatory Visit (HOSPITAL_COMMUNITY): Admission: RE | Admit: 2010-04-24 | Discharge: 2010-04-24 | Payer: Self-pay | Admitting: Infectious Disease

## 2010-04-24 ENCOUNTER — Encounter: Admission: RE | Admit: 2010-04-24 | Discharge: 2010-04-24 | Payer: Self-pay | Admitting: Endocrinology

## 2010-04-25 ENCOUNTER — Telehealth: Payer: Self-pay | Admitting: Infectious Disease

## 2010-05-31 ENCOUNTER — Ambulatory Visit: Payer: Self-pay | Admitting: Infectious Disease

## 2010-06-15 ENCOUNTER — Encounter: Payer: Self-pay | Admitting: Infectious Disease

## 2010-07-07 ENCOUNTER — Ambulatory Visit: Payer: Self-pay | Admitting: Endocrinology

## 2010-07-07 DIAGNOSIS — Z9884 Bariatric surgery status: Secondary | ICD-10-CM | POA: Insufficient documentation

## 2010-07-07 HISTORY — DX: Bariatric surgery status: Z98.84

## 2010-07-07 LAB — CONVERTED CEMR LAB
ALT: 23 units/L (ref 0–35)
AST: 22 units/L (ref 0–37)
Albumin: 4.1 g/dL (ref 3.5–5.2)
Alkaline Phosphatase: 121 units/L — ABNORMAL HIGH (ref 39–117)
Bilirubin, Direct: 0.2 mg/dL (ref 0.0–0.3)
Cholesterol: 161 mg/dL (ref 0–200)
HDL: 29.3 mg/dL — ABNORMAL LOW (ref >39.00)
Hgb A1c MFr Bld: 7 % — ABNORMAL HIGH (ref 4.6–6.5)
LDL Cholesterol: 96 mg/dL (ref 0–99)
Total Bilirubin: 0.6 mg/dL (ref 0.3–1.2)
Total CHOL/HDL Ratio: 5
Total Protein: 6.3 g/dL (ref 6.0–8.3)
Triglycerides: 177 mg/dL — ABNORMAL HIGH (ref 0.0–149.0)
VLDL: 35.4 mg/dL (ref 0.0–40.0)

## 2010-07-10 LAB — CONVERTED CEMR LAB
Calcium, Total (PTH): 9.3 mg/dL (ref 8.4–10.5)
PTH: 32.3 pg/mL (ref 14.0–72.0)

## 2010-07-18 ENCOUNTER — Encounter: Admission: RE | Admit: 2010-07-18 | Discharge: 2010-07-18 | Payer: Self-pay | Admitting: Endocrinology

## 2010-07-18 ENCOUNTER — Other Ambulatory Visit: Admission: RE | Admit: 2010-07-18 | Discharge: 2010-07-18 | Payer: Self-pay | Admitting: Interventional Radiology

## 2010-07-18 ENCOUNTER — Encounter: Payer: Self-pay | Admitting: Endocrinology

## 2010-07-24 ENCOUNTER — Encounter: Payer: Self-pay | Admitting: Endocrinology

## 2010-07-28 ENCOUNTER — Telehealth: Payer: Self-pay | Admitting: Endocrinology

## 2010-07-31 ENCOUNTER — Encounter: Payer: Self-pay | Admitting: Endocrinology

## 2010-08-07 ENCOUNTER — Encounter: Payer: Self-pay | Admitting: Endocrinology

## 2010-08-09 ENCOUNTER — Encounter: Payer: Self-pay | Admitting: Endocrinology

## 2010-08-21 ENCOUNTER — Encounter: Payer: Self-pay | Admitting: Endocrinology

## 2010-09-08 ENCOUNTER — Ambulatory Visit (HOSPITAL_COMMUNITY): Admission: RE | Admit: 2010-09-08 | Discharge: 2010-09-08 | Payer: Self-pay | Admitting: Obstetrics and Gynecology

## 2010-09-08 LAB — HM MAMMOGRAPHY: HM Mammogram: NEGATIVE

## 2010-09-19 ENCOUNTER — Ambulatory Visit: Payer: Self-pay | Admitting: Endocrinology

## 2010-09-19 DIAGNOSIS — E890 Postprocedural hypothyroidism: Secondary | ICD-10-CM | POA: Insufficient documentation

## 2010-09-19 HISTORY — DX: Postprocedural hypothyroidism: E89.0

## 2010-09-19 LAB — CONVERTED CEMR LAB
ALT: 16 units/L (ref 0–35)
AST: 20 units/L (ref 0–37)
Albumin: 3.7 g/dL (ref 3.5–5.2)
Alkaline Phosphatase: 110 units/L (ref 39–117)
BUN: 13 mg/dL (ref 6–23)
Bilirubin, Direct: 0.1 mg/dL (ref 0.0–0.3)
CO2: 32 meq/L (ref 19–32)
Calcium: 9 mg/dL (ref 8.4–10.5)
Chloride: 105 meq/L (ref 96–112)
Creatinine, Ser: 0.7 mg/dL (ref 0.4–1.2)
GFR calc non Af Amer: 98.33 mL/min (ref >60)
Glucose, Bld: 130 mg/dL — ABNORMAL HIGH (ref 70–99)
Hgb A1c MFr Bld: 6.7 % — ABNORMAL HIGH (ref 4.6–6.5)
Potassium: 4.2 meq/L (ref 3.5–5.1)
Sodium: 143 meq/L (ref 135–145)
TSH: 2.04 microintl units/mL (ref 0.35–5.50)
Total Bilirubin: 0.6 mg/dL (ref 0.3–1.2)
Total Protein: 5.7 g/dL — ABNORMAL LOW (ref 6.0–8.3)

## 2010-09-26 ENCOUNTER — Telehealth: Payer: Self-pay | Admitting: Endocrinology

## 2010-12-03 ENCOUNTER — Encounter: Payer: Self-pay | Admitting: Infectious Disease

## 2010-12-12 NOTE — Letter (Signed)
Summary: [facility]  [facility]   Imported By: Bubba Hales 01/19/2009 10:46:04  _____________________________________________________________________  External Attachment:    Type:   Image     Comment:   External Document

## 2010-12-12 NOTE — Progress Notes (Signed)
Summary: fax-surgical clearance  Phone Note From Other Clinic   Caller: Cordelia Pen [phone] Summary of Call: Cordelia Pen called stating that they have been trying to fax Dr. Everardo All in regards to the patient. They will fax request for sugical clearance, and need MD to write on it that it is ok for the pt to have surgery and fax back to [phone]. Initial call taken by: Lucious Groves,  February 01, 2010 12:26 PM  Follow-up for Phone Call        pt called requesting revised letter be sent back to [facility] as soon as possible, she is trying to schedule surgery for April and would need letter to start approval process with Insurance Co. Follow-up by: Margaret Pyle, CMA,  February 01, 2010 4:32 PM  Additional Follow-up for Phone Call Additional follow up Details #1::        the ecg request was forwarded to medical records per hippa rules. Additional Follow-up by: Minus Breeding MD,  February 02, 2010 8:30 AM    Additional Follow-up for Phone Call Additional follow up Details #2::    Paperwork is requesting that it be hand written above the letter that was wrote for clearance on Pulmonary and Cardiology. They just need the letter revised. Follow-up by: Josph Macho RMA,  February 02, 2010 9:16 AM  Additional Follow-up for Phone Call Additional follow up Details #3:: Details for Additional Follow-up Action Taken: i can't give clearance on behalf of other services--pt will need to see these specialists for this. Additional Follow-up by: Minus Breeding MD,  February 02, 2010 10:44 AM  I informed Cordelia Pen and tried to call [employer] I got VM, left a message asking pt to please return my call. Josph Macho RMA, February 02, 2010 10:56 AM  Pt states the letter for clearance on Pulmonary and Cardiology is to be done by a specialist if she already sees them? Pt states MD did an EKG and said everything was ok and the letter just needs to state that? Pt states that she wants to have the surgery in April and if she has to see  a specialist she will need a referral and that could take months to get into? Please advise  i called pt 02/02/10.  i wrote another letter at pt's request.   sean ellison, md  Faxed new letter. Josph Macho, RMA

## 2010-12-12 NOTE — Assessment & Plan Note (Signed)
Summary: 5 mth fu-$50-stc   Vital Signs:  Patient Profile:   55 Years Old Female Weight:      261.8 pounds Temp:     98.2 degrees F oral Pulse rate:   91 / minute BP sitting:   124 / 62  (left arm) Cuff size:   large  Vitals Entered By: Tomma Lightning (July 16, 2008 1:22 PM)                 Chief Complaint:  5 month f/u.[name]  History of Present Illness: pt states cbg's have been elevated recently (200-400).  her diet is poor.  no pattern throughout the day. she cites frequent travel and job stress as underlying causes.    Current Allergies: ! CODEINE ! * ANTIHISTAMINES ! ACE INHIBITORS  Past Medical History:    Reviewed history from 11/21/2007 and no changes required:       esophageal stricture       Diabetes mellitus, type II       Hyperlipidemia       Hypertension       asthma as child       DJD       Depression       Peripheral neuropathy       Allergic rhinitis     Review of Systems  The patient denies hypoglycemia.     Physical Exam  General:     obese.   Extremities:     1+ left pedal edema and 1+ right pedal edema.      Impression & Recommendations:  Problem # 1:  DIABETES MELLITUS, TYPE II (ICD-250.00)  Medications Added to Medication List This Visit: 1)  Lantus 100 Unit/ml Soln (Insulin glargine) .... Take 250 units subcutaneously qd 2)  Humalog Kwikpen 100 Unit/ml Soln (Insulin lispro (human)) .... 60 units once daily (before supper) 3)  Insupen Ultrafin 29g X 18m Misc (Insulin pen needle) .... Qd   Patient Instructions: 1)  increase lantus to 225/day 2)  if necessary, then increase to 250/day 3)  same humalog 4)  ret few weeks   Prescriptions: INSUPEN ULTRAFIN 29G X 12MM MISC (INSULIN PEN NEEDLE) qd  #50 x 11   Entered and Authorized by:   SDonavan FoilMD   Signed by:   SDonavan FoilMD on 07/16/2008   Method used:   Electronically to        [facility] #[phone] (retail)       [address] [place]  [address]      Ph: [phone]      Fax: [phone]  RxID:   [id number] KWIKPEN 100 UNIT/ML  SOLN (INSULIN LISPRO (HUMAN)) 60 units once daily (before supper)  #2 x 11   Entered and Authorized by:   SDonavan FoilMD   Signed by:   SDonavan FoilMD on 07/16/2008   Method used:   Electronically to        [facility] #[phone] (retail)       [address] [place]  [address]      Ph: [phone]      Fax: [phone]  RxID:   [id number] 100 UNIT/ML  SOLN (INSULIN GLARGINE) take 250 units subcutaneously qd  #9 x 11  Entered and Authorized by:   Donavan Foil MD   Signed by:   Donavan Foil MD on 07/16/2008   Method used:   Electronically to        [facility]. [phone]* (retail)       [address]       [place], [place]  [address]       Ph: [phone]       Fax: [phone]   RxID:   [id number]  ]

## 2010-12-12 NOTE — Progress Notes (Signed)
  Phone Note Call from Patient Call back at Work Phone [phone]   Caller: Patient Summary of Call: Pt called stating she has beentaking 144mg of Levothyroxine and is [facility]ill to [facility] call taken by: DCrissie Sickles CDupree  September 26, 2010 1:40 PM  Follow-up for Phone Call        i added to med list.  please send to whichever [facility] pt requests Follow-up by: SDonavan FoilMD,  September 26, 2010 1:44 PM  Additional Follow-up for Phone Call Additional follow up Details #1::        LMOVM for pt//will [facility] which she will have transferred to one closer to her..Marland KitchenMarland KitchenEllison HughsArchie CMA  September 26, 2010 2:14 PM     New/Updated Medications: LEVOTHYROXINE SODIUM 125 MCG TABS (LEVOTHYROXINE SODIUM) 1 tab once daily Prescriptions: LEVOTHYROXINE SODIUM 125 MCG TABS (LEVOTHYROXINE SODIUM) 1 tab once daily  #30 x 11   Entered by:   LEstell HarpinCMA   Authorized by:   SDonavan FoilMD   Signed by:   LEstell HarpinCMA on 09/26/2010   Method used:   [phone] [facility]       [address]       [place]  [address]      Ph: [phone]      Fax: [phone]  RxID::   [id number] SODIUM 125 MCG TABS (LEVOTHYROXINE SODIUM) 1 tab once daily  #30 x 11   Entered and Authorized by:   SDonavan FoilMD   Signed by:   SDonavan FoilMD on 09/26/2010   Method used:   Electronically to        [facility] #[phone] (retail)       [address]      [place]  [address]      Ph: [phone]      Fax: [phone]  RxID:   [id number]

## 2010-12-12 NOTE — Assessment & Plan Note (Signed)
Summary: 3 MTH FU  STC   Vital Signs:  Patient profile:   55 year old female Height:      72 inches (182.88 cm) Weight:      214 pounds (97.27 kg) BMI:     29.13 O2 Sat:      97 % on Room air Temp:     98.2 degrees F (36.78 degrees C) oral Pulse rate:   59 / minute BP sitting:   120 / 78  (left arm) Cuff size:   large  Vitals Entered By: Rebeca Alert MA (July 07, 2010 8:25 AM)  O2 Flow:  Room air CC: 3 month F/U/pt is no longer using BD Pen Needles/aj Is Patient Diabetic? Yes   Referring Sharaine Delange:  Renato Shin Primary Quianna Avery:  Donavan Foil MD  CC:  3 month F/U/pt is no longer using BD Pen Needles/aj.  History of Present Illness: dm:  no cbg record, but states cbg's are 125-140 in am, and 109 in the afternoon.  she takes zocor as rx'ed.   pt states of few mos of oily-quality discharge from the rectum, but assoc bleeding.   Current Medications (verified): 1)  Protonix 40 Mg  Tbec (Pantoprazole Sodium) .... Take 1 By Mouth Qd 2)  Lyrica 150 Mg  Caps (Pregabalin) .[name].. 1 Tab Two Times A Day 3)  Triamcinolone Acetonide 0.025 % Crea (Triamcinolone Acetonide) .... Three Times A Day As Needed Rash 4)  Docusate Sodium 250 Mg Caps (Docusate Sodium) .... Take 1 Two Times A Day 5)  Cvs Loratadine 10 Mg Tabs (Loratadine) .[name].. 1 Qd 6)  Flonase 50 Mcg/act Susp (Fluticasone Propionate) .... 2 Sprays Each Side As Needed 7)  Bd Pen Needle Short U/f 31g X 8 Mm Misc (Insulin Pen Needle) .... Qid 8)  Kombiglyze Xr 2.03-999 Mg Xr24h-Tab (Saxagliptin-Metformin) .... 2 Tabs Each Am 9)  Actigall 300 Mg Caps (Ursodiol) .[name].. 1 Tablet Two Times A Day 10)  Tums .... 2 Tabs 3x Day 11)  Multivitamin .[name].. 3 Daily 12)  Pepto Bismol .[name].. 1 T Qid 13)  Simvastatin 40 Mg Tabs (Simvastatin) .[name].. 1 Once Daily  Allergies (verified): 1)  ! * Antihistamines 2)  ! Ace Inhibitors 3)  ! Caffeine-Sodium Benzoate (Caffeine-Sodium Benzoate)  Past History:  Past Medical History: Last updated:  11/21/2007 esophageal stricture Diabetes mellitus, type II Hyperlipidemia Hypertension asthma as child DJD Depression Peripheral neuropathy Allergic rhinitis  Review of Systems       denies n/v.  weight-loss persists  Physical Exam  General:  normal appearance.   Extremities:  no edema   Impression & Recommendations:  Problem # 1:  DIABETES MELLITUS, TYPE II (ICD-250.00) Assessment Improved  Problem # 2:  HYPERLIPIDEMIA (YFV-494.4) Assessment: Improved  Problem # 3:  BARIATRIC SURGERY STATUS (ICD-V45.86) with steatorrhea  Medications Added to Medication List This Visit: 1)  Flonase 50 Mcg/act Susp (Fluticasone propionate) .... 2 sprays each side as needed 2)  Kombiglyze Xr 2.03-999 Mg Xr24h-tab (Saxagliptin-metformin) .[name].. 1 tab each am 3)  Simvastatin 40 Mg Tabs (Simvastatin) .... 1/2 tab once daily  Other Orders: T-Parathyroid Hormone, Intact w/ Calcium (49675-91638) Radiology Referral (Radiology) TLB-Lipid Panel (80061-LIPID) TLB-A1C / Hgb A1C (Glycohemoglobin) (83036-A1C) TLB-Hepatic/Liver Function Pnl (80076-HEPATIC) Est. Patient Level IV ([address]659)  Patient Instructions: 1)  blood tests and thyroid ultrasound are being ordered for you today. please call [phone] to hear your test results. 2)  Please schedule a follow-up appointment in 3 months. 3)  check ultrasound-guided biopsy of the latgest  thyroid nodule.  you will be called with a day and time for an appointment 4)  most of the time, a "lumpy thyroid" will eventually become overactive.  this is usually a slow process, happening over the span of many years. 5)  (update: i left message on phone-tree:  reduce zocor to 1/2 of 40 mg once daily. reduce kombiglyze to 1 tab each am).

## 2010-12-12 NOTE — Assessment & Plan Note (Signed)
Summary: 3 MTH FU--D/T---STC   Vital Signs:  Patient profile:   55 year old female Height:      72 inches (182.88 cm) Weight:      197.38 pounds (89.72 kg) BMI:     26.87 O2 Sat:      97 % on Room air Temp:     98.5 degrees F (36.94 degrees C) oral Pulse rate:   64 / minute BP sitting:   108 / 74  (left arm) Cuff size:   large  Vitals Entered By: Rebeca Alert CMA Deborra Medina) (September 19, 2010 8:14 AM)  O2 Flow:  Room air CC: Follow-up visit/pt is no longer taking Pepto Bismol/aj Is Patient Diabetic? Yes Comments pt is taking Levothyroxine but is unsure of dosage   Referring Provider:  Renato Shin Primary Provider:  Donavan Foil MD  CC:  Follow-up visit/pt is no longer taking Pepto Bismol/aj.  History of Present Illness: pt recently had thyroidectomy. pathology showed benign right adenoma.  pt states she feels well in general. she takes kombiglyze 1 each am.  no cbg record, but states cbg's are low-100's. she takes synthroid as rx'ed, but does not know the dosage. she has 1 month of moderate burning of the legs and feet, and assoc numbness.  Current Medications (verified): 1)  Protonix 40 Mg  Tbec (Pantoprazole Sodium) .... Take 1 By Mouth Qd 2)  Lyrica 150 Mg  Caps (Pregabalin) .[name].. 1 Tab Two Times A Day 3)  Triamcinolone Acetonide 0.025 % Crea (Triamcinolone Acetonide) .... Three Times A Day As Needed Rash 4)  Docusate Sodium 250 Mg Caps (Docusate Sodium) .... Take 1 Two Times A Day 5)  Cvs Loratadine 10 Mg Tabs (Loratadine) .[name].. 1 Qd 6)  Flonase 50 Mcg/act Susp (Fluticasone Propionate) .... 2 Sprays Each Side As Needed 7)  Bd Pen Needle Short U/f 31g X 8 Mm Misc (Insulin Pen Needle) .... Qid 8)  Kombiglyze Xr 2.03-999 Mg Xr24h-Tab (Saxagliptin-Metformin) .[name].. 1 Tab Each Am 9)  Actigall 300 Mg Caps (Ursodiol) .[name].. 1 Tablet Two Times A Day 10)  Tums .[name].. 2-3 Tablets Four Times A Day 11)  Multivitamin .[name].. 3 Daily 12)  Pepto Bismol .[name].. 1 T Qid 13)  Simvastatin 40 Mg  Tabs (Simvastatin) .... 1/2 Tab Once Daily 14)  Onetouch Ultra Blue  Strp (Glucose Blood) .... Use As Directed 250.01 15)  Flax Seed Oil 1000 Mg Caps (Flaxseed (Linseed)) .[name].. 1 By Mouth Once Daily 16)  Fish Oil 1000 Mg Caps (Omega-3 Fatty Acids) .[name].. 1 By Mouth Once Daily 17)  Vitamin D3 400 Unit Tabs (Cholecalciferol) .[name].. 1 By Mouth Once Daily 18)  Vitamin E 600 Unit Caps (Vitamin E) .[name].. 1 By Mouth Once Daily 19)  Biotin 5 Mg Caps (Biotin) .[name].. 1 By Mouth Once Daily 20)  Selenium 100 Mcg Tabs (Selenium) .[name].. 1 By Mouth Once Daily  Allergies (verified): 1)  ! * Antihistamines 2)  ! Ace Inhibitors 3)  ! Caffeine-Sodium Benzoate (Caffeine-Sodium Benzoate)  Past History:  Past Medical History: Last updated: 11/21/2007 esophageal stricture Diabetes mellitus, type II Hyperlipidemia Hypertension asthma as child DJD Depression Peripheral neuropathy Allergic rhinitis  Review of Systems       weight-loss persists.  she has hoarseness  Physical Exam  General:  normal appearance.   Pulses:  dorsalis pedis intact bilat.   Extremities:  no deformity.  no ulcer on the feet.  feet are of normal color and temp.   trace right pedal edema  and trace left pedal edema.   Neurologic:  voice is slightly hoarse sensation is intact to touch on the feet, but slightly decreased from normal Additional Exam:  outside test results are reviewed: i reviewed thyroid pathol report  FastTSH                   2.04 uIU/mL    Hemoglobin A1C       [H]  6.7 %     Impression & Recommendations:  Problem # 1:  THYROID NODULE (ICD-241.0) benign on pathol.  Problem # 2:  POSTSURGICAL HYPOTHYROIDISM (ICD-244.0) well-replaced  Problem # 3:  PERIPHERAL NEUROPATHY (ICD-356.9) with painful component needs increased rx  Problem # 4:  DIABETES MELLITUS, TYPE II (ICD-250.00) well-controlled  Medications Added to Medication List This Visit: 1)  Lyrica 150 Mg Caps (Pregabalin) .[name].. 1 tab each am, and 2 at  bedtime 2)  Tums  .[name].. 2-3 tablets four times a day 3)  Flax Seed Oil 1000 Mg Caps (Flaxseed (linseed)) .[name].. 1 by mouth once daily 4)  Fish Oil 1000 Mg Caps (Omega-3 fatty acids) .[name].. 1 by mouth once daily 5)  Vitamin D3 400 Unit Tabs (Cholecalciferol) .[name].. 1 by mouth once daily 6)  Vitamin E 600 Unit Caps (Vitamin e) .[name].. 1 by mouth once daily 7)  Biotin 5 Mg Caps (Biotin) .[name].. 1 by mouth once daily 8)  Selenium 100 Mcg Tabs (Selenium) .[name].. 1 by mouth once daily  Other Orders: Admin 1st Vaccine (70017) Flu Vaccine 27yr + ((49449 TLB-TSH (Thyroid Stimulating Hormone) (84443-TSH) TLB-A1C / Hgb A1C (Glycohemoglobin) (83036-A1C) TLB-BMP (Basic Metabolic Panel-BMET) ([id number] TLB-Hepatic/Liver Function Pnl (80076-HEPATIC) Est. Patient Level IV ((99357  Patient Instructions: 1)  please sign release of information for pathology report from [facility].   2)  blood tests are being ordered for you today.  please call [phone] hear your test results. 3)  Please schedule a follow-up appointment in 3 months. 4)  please call [place] synth[place] dosage.   5)  increase lyrica to 1 each am and 2 at bedtime 6)  (update: i left message on phone-tree:  rx as we discussed) Prescriptions: LYRICA 150 MG  CAPS (PREGABALIN) 1 tab each am, and 2 at bedtime  #90 x 5   Entered and Authorized by:   SDonavan FoilMD   Signed by:   SDonavan FoilMD on 09/19/2010   Method used:   Print then Give to Patient   RxID:   [id number]   Orders Added: 1)  Admin 1st Vaccine [90471] 2)  Flu Vaccine 347yr+ [9[33545])  TLB-TSH (Thyroid Stimulating Hormone) [84443-TSH] 4)  TLB-A1C / Hgb A1C (Glycohemoglobin) [83036-A1C] 5)  TLB-BMP (Basic Metabolic Panel-BMET) [8[62563-SLHTDSK])  TLB-Hepatic/Liver Function Pnl [80076-HEPATIC] 7)  Est. Patient Level IV [9[87681]    Flu Vaccine Consent Questions     Do you have a history of severe allergic reactions to this vaccine? no    Any prior history of  allergic reactions to egg and/or gelatin? no    Do you have a sensitivity to the preservative Thimersol? no    Do you have a past history of Guillan-Barre Syndrome? no    Do you currently have an acute febrile illness? no    Have you ever had a severe reaction to latex? no    Vaccine information given and explained to patient? yes    Are you currently pregnant? no    Lot Number:[id number]  Exp Date:05/12/2011   Site Given  Left Deltoid [email or web address]

## 2010-12-12 NOTE — Miscellaneous (Signed)
Summary: [facility]   Imported By: Bonner Puna 06/19/2010 16:51:58  _____________________________________________________________________  External Attachment:    Type:   Image     Comment:   External Document

## 2010-12-12 NOTE — Letter (Signed)
Summary: Generic Letter  [facility]  [address] [place], [place] [address]   Phone: [phone]  Fax: [phone]    02/02/2010  [facility] [address] [place], [place]  [address]  Dear Ms. Schrade,   This letter is to say that you do not have any known heart or lung disease.  Your cardiogram was normal on 10/20/2009.   Sincerely,   Romero Belling MD

## 2010-12-12 NOTE — Assessment & Plan Note (Signed)
   Vital Signs:  Patient Profile:   55 Years Old Female Weight:      248 pounds Temp:     98 degrees F Pulse rate:   82 / minute BP sitting:   125 / 81               Chief Complaint:  new visit for dm--self referred.  History of Present Illness: dm x 8 yrs.  on lantus 90/day, and metformin.  not well-controlled.  seldom checks cbg's.  no known complications.  8 yrs bilat foot pain/numbness.  htn--takes 2 meds as rx'ed.    few mos intermittent ruq pain, not with meals.      Past Medical History:    esophageal stricture    Diabetes mellitus, type II    Hyperlipidemia    Hypertension   Family History:    Family History Diabetes 1st degree relative  Social History:    works  Education officer, environmental    Single    Former Smoker   Risk Factors:  Tobacco use:  quit   Review of Systems       has chronic bilat knee pain denies n/v   Physical Exam  General:     alert, cooperative to examination, and overweight-appearing.   Mouth:     Oral mucosa and oropharynx without lesions or exudates.  Neck:     no thyroid nodules or tenderness.   Lungs:     normal respiratory effort and normal breath sounds.   Heart:     normal rate, regular rhythm, and no murmur.   Pulses:     R carotid normal and L carotid normal.   Extremities:     normal color/temp on feet 1+ left pedal edema and 1+ right pedal edema.   no foot ulcer  Neurologic:     alert & oriented X3.   sensation slightly decreased on feet     Impression & Recommendations:  Problem # 1:  DIABETES MELLITUS, TYPE II (ICD-250.00) rx limited by lack of cbg's  Problem # 2:  HYPERTENSION (ICD-401.9) well-controlled chronic bilat knee pain ruq pain   Patient Instructions: 1)  pt advised no generic for micardis 2)  abd [place] 3)  ref ortho 4)  ret <30d 5)  Check your blood sugars regularly. If your readings are usually above : or below 70 you should contact our office.

## 2010-12-12 NOTE — Progress Notes (Signed)
Summary: lants rx  Phone Note From Pharmacy Call back at [facility] Phone [phone]   Caller: [facility]  [place]. [phone] Call For: lucy  Summary of Call:  per pt call need to verfiy if her lantus has been increased  to 200 units need a rx sent to pharmacy  Lantus 100 Unit/ml Soln (Insulin glargine) .... Take 200 units subcutaneously qd  Initial call taken by: Nonah Mattes,  May 21, 2008 10:30 AM  Follow-up for Phone Call        Rx was sent yesterday electronically by Dr. Loanne Drilling to [facility]. Follow-up by: Tomma Lightning,  May 21, 2008 10:37 AM  Additional Follow-up for Phone Call Additional follow up Details #1::        Phone call completed, Pharmacist called spoke with the pharmacist made aware Additional Follow-up by: Nonah Mattes,  May 21, 2008 1:22 PM

## 2010-12-12 NOTE — Letter (Signed)
Summary: [facility]   Imported By: Sherian Rein 08/03/2010 10:30:35  _____________________________________________________________________  External Attachment:    Type:   Image     Comment:   External Document

## 2010-12-12 NOTE — Assessment & Plan Note (Signed)
Summary: PER PT 3-4 MTH FU   STC   Vital Signs:  Patient profile:   55 year old female Height:      72 inches (182.88 cm) Weight:      297.25 pounds (135.11 kg) O2 Sat:      97 % on Room air Temp:     97.4 degrees F (36.33 degrees C) oral Pulse rate:   84 / minute BP sitting:   118 / 72  (left arm) Cuff size:   large  Vitals Entered By: Sydell Axon (January 20, 2010 8:40 AM)  O2 Flow:  Room air CC: 3 month F/U pt states she is no longer taking Crestor, Ciprofloxacin, or Augmentin   Primary Provider:  Minus Breeding MD  CC:  3 month F/U pt states she is no longer taking Crestor, Ciprofloxacin, and or Augmentin.  History of Present Illness: the status of 3 chronic medical problems is addressed today: fever:  pt states she continues to have intermittent fever--was 102 few days ago.  dm:  no cbg record, but states cbg's are sometimnes low before lunch and at hs.  it is highest in am. dyslipidemia:  pt stopped crestor due tue to fear of side-effects.  she prefers vytorin.  Current Medications (verified): 1)  Metformin Hcl 500 Mg  Tabs (Metformin Hcl) .... Take 2 By Mouth Bid 2)  Protonix 40 Mg  Tbec (Pantoprazole Sodium) .... Take 1 By Mouth Qd 3)  Micardis 40 Mg  Tabs (Telmisartan) .... Take 1 By Mouth Qd 4)  Ibuprofen 800 Mg  Tabs (Ibuprofen) .... Take 1 By Mouth Two Times A Day Once Daily Prn 5)  Adult Aspirin Low Strength 81 Mg  Tbdp (Aspirin) .... Take 1 By Mouth Qd 6)  Lantus 100 Unit/ml  Soln (Insulin Glargine) .... Take 110 Units Subcutaneously Qd 7)  Humalog Kwikpen 100 Unit/ml  Soln (Insulin Lispro (Human)) .... Three Times A Day (Qac) 40-40-90 Units 8)  Lyrica 150 Mg  Caps (Pregabalin) .[name].. 1 Tab Two Times A Day 9)  Triamcinolone Acetonide 0.025 % Crea (Triamcinolone Acetonide) .... Three Times A Day As Needed Rash 10)  Onetouch Ultra Test  Strp (Glucose Blood) .... Three Times A Day, and Lancets 250.01 11)  Methocarbamol 500 Mg Tabs (Methocarbamol) .... Take 1 Q  6 Hours Prn 12)  Docusate Sodium 250 Mg Caps (Docusate Sodium) .... Take 1 Two Times A Day 13)  Actos 45 Mg Tabs (Pioglitazone Hcl) .... Qd 14)  Cvs Loratadine 10 Mg Tabs (Loratadine) .[name].. 1 Qd 15)  Flonase 50 Mcg/act Susp (Fluticasone Propionate) .... 2 Sprays Each Side Qd 16)  Hydrochlorothiazide 25 Mg Tabs (Hydrochlorothiazide) .[name].. 1 Tab Daily 17)  Bd Pen Needle Short U/f 31g X 8 Mm Misc (Insulin Pen Needle) .... Qid 18)  Crestor 40 Mg Tabs (Rosuvastatin Calcium) .[name].. 1 Qhs 19)  Ciprofloxacin Hcl 500 Mg Tabs (Ciprofloxacin Hcl) .[name].. 1 Bid 20)  Augmentin 875-125 Mg Tabs (Amoxicillin-Pot Clavulanate) .[name].. 1 By Mouth Two Times A Day X 7days  Allergies (verified): 1)  ! Codeine 2)  ! * Antihistamines 3)  ! Ace Inhibitors  Past History:  Past Medical History: Last updated: 11/21/2007 esophageal stricture Diabetes mellitus, type II Hyperlipidemia Hypertension asthma as child DJD Depression Peripheral neuropathy Allergic rhinitis  Review of Systems       she has intermitent dysuria, but feels that is due to local sxs at the vagina.  denies abdominal pain.  Physical Exam  General:  obese.  no  distress  Skin:  no rash Psych:  Alert and cooperative; normal mood and affect; normal attention span and concentration.   Additional Exam:  Hemoglobin A1C       [H]  8.1 %                       4.6-6.5 Sed Rate             [H]  59 mm/hr     Impression & Recommendations:  Problem # 1:  DIABETES MELLITUS, TYPE II (ICD-250.00) needs increased rx  Problem # 2:  FEVER UNSPECIFIED (ICD-780.60) uncertain etiology  Problem # 3:  HYPERLIPIDEMIA (ICD-272.4) needs increased rx  Medications Added to Medication List This Visit: 1)  Lantus 100 Unit/ml Soln (Insulin glargine) .... Take 120 units subcutaneously qd 2)  Humalog Kwikpen 100 Unit/ml Soln (Insulin lispro (human)) .... Three times a day (qac) 35-40-85 units 3)  Vytorin 10-80 Mg Tabs (Ezetimibe-simvastatin) .[name].. 1 at  bedtime  Other Orders: Infectious Disease Referral (ID) TLB-Microalbumin/Creat Ratio, Urine (82043-MALB) TLB-A1C / Hgb A1C (Glycohemoglobin) (83036-A1C) TLB-Sedimentation Rate (ESR) (85652-ESR) TLB-Hepatic/Liver Function Pnl (80076-HEPATIC) Est. Patient Level IV (36644)  Patient Instructions: 1)  refer infectious diseases dr.  you will be called with a day and time for an appointment.   2)  check your blood sugar 2 times a day.  vary the time of day when you check, between before the 3 meals, and at bedtime.  also check if you have symptoms of your blood sugar being too high or too low.  please keep a record of the readings and bring it to your next appointment here.  please call us sooner if you are having low blood sugar episodes. 3)  pending the test results, please decrease humalog to (just before each meal).  35-40-85 units.  increase lantus to 120 units once daily 4)  change crestor to vytorin 10/80, 1 at bedtime. 5)  bariatric surgery letter is written. Prescriptions: VYTORIN 10-80 MG TABS (EZETIMIBE-SIMVASTATIN) 1 at bedtime  #30 x 11   Entered and Authorized by:   Minus Breeding MD   Signed by:   Minus Breeding MD on 01/20/2010   Method used:   Electronically to        [facility]. [phone]* (retail)       [address]       [place], [place]  [address]       Ph: [phone]       Fax: [phone]   RxID:   [id number]

## 2010-12-12 NOTE — Letter (Signed)
Summary: [facility]  [facility]   Imported By: Lester Ault 03/08/2010 09:24:52  _____________________________________________________________________  External Attachment:    Type:   Image     Comment:   External Document

## 2010-12-12 NOTE — Progress Notes (Signed)
Summary: Medical Release completed   Medical Release completed by patient for records to be forwarded to [facility]. Request forwarded to [facility]. Dena Chavis  January 10, 2010 8:16 AM

## 2010-12-12 NOTE — Progress Notes (Signed)
Summary: bone density  Phone Note Outgoing Call   Call placed by: Tomma Lightning,  January 14, 2009 9:08 AM Call placed to: [facility] Details for Reason: BONE DENSITY Summary of Call: MD recieved results back from bone density that was done on 01/07/09. Results was normal. Left msg on phone-tree. Initial call taken by: Tomma Lightning,  January 14, 2009 9:09 AM

## 2010-12-12 NOTE — Assessment & Plan Note (Signed)
Summary: 4-6 WK FU  $50  STC   Vital Signs:  Patient profile:   55 year old female Height:      72 inches Weight:      295 pounds BMI:     40.15 O2 Sat:      96 % on Room air Temp:     97.4 degrees F oral Pulse rate:   93 / minute BP sitting:   134 / 60  (left arm) Cuff size:   large  Vitals Entered By: Charlynne Cousins CMA (July 15, 2009 9:54 AM)  O2 Flow:  Room air CC: pt here for a follow up office visit and states that she take 30-30-70 of her humalog instead of 30-30-60, pt no longer takes tramadol,metanx or metoprolol/ chart is being ordered to check dates of immunizations and preventive screenings/ ab   CC:  pt here for a follow up office visit and states that she take 30-30-70 of her humalog instead of 30-30-60, pt no longer takes tramadol, and metanx or metoprolol/ chart is being ordered to check dates of immunizations and preventive screenings/ ab.  History of Present Illness: no cbg record, but states cbg's vary from 149 to 231.  she says there is no trend throughout the day.  she says "i don't check it like i should."  Current Medications (verified): 1)  Metformin Hcl 500 Mg  Tabs (Metformin Hcl) .... Take 2 By Mouth Bid 2)  Protonix 40 Mg  Tbec (Pantoprazole Sodium) .... Take 1 By Mouth Qd 3)  Micardis 40 Mg  Tabs (Telmisartan) .... Take 1 By Mouth Qd 4)  Ibuprofen 800 Mg  Tabs (Ibuprofen) .... Take 1 By Mouth Two Times A Day Once Daily Prn 5)  Adult Aspirin Low Strength 81 Mg  Tbdp (Aspirin) .... Take 1 By Mouth Qd 6)  Lantus 100 Unit/ml  Soln (Insulin Glargine) .... Take 110 Units Subcutaneously Qd 7)  Humalog Kwikpen 100 Unit/ml  Soln (Insulin Lispro (Human)) .... Three Times A Day (Qac) 30-30-60 8)  Lyrica 150 Mg  Caps (Pregabalin) .... Two Times A Day--Dr Blenda Mounts 9)  Insupen Ultrafin 29g X 45m Misc (Insulin Pen Needle) .... Use As Directed 10)  Triamcinolone Acetonide 0.025 % Crea (Triamcinolone Acetonide) .... Three Times A Day As Needed Rash 11)  Onetouch  Ultra Test  Strp (Glucose Blood) .... Three Times A Day, and Lancets 250.01 12)  Tramadol Hcl 50 Mg Tabs (Tramadol Hcl) .... Take 1-2 Q 4-6 Hours Prn 13)  Methocarbamol 500 Mg Tabs (Methocarbamol) .... Take 1 Q 6 Hours Prn 14)  Metanx 2.8-25-2 Mg Tabs (L-Methylfolate-B6-B12) .... Take 1 Two Times A Day 15)  Docusate Sodium 250 Mg Caps (Docusate Sodium) .... Take 1 Two Times A Day 16)  Actos 45 Mg Tabs (Pioglitazone Hcl) .... Qd 17)  Metoprolol-Hydrochlorothiazide 50-25 Mg Tabs (Metoprolol-Hydrochlorothiazide) ..[name]. 1 Daily 18)  Cvs Loratadine 10 Mg Tabs (Loratadine) ..[name]. 1 Qd 19)  Flonase 50 Mcg/act Susp (Fluticasone Propionate) .... 2 Sprays Each Side Qd 20)  Ct Chest With Contrast "r/o Pe" .... Sob, Elev D-Dimer  Bun and Creat Are Normal, and Are On E-Chart 21)  Hydrochlorothiazide 25 Mg Tabs (Hydrochlorothiazide) ..[name]. 1 Tab Daily  Allergies (verified): 1)  ! Codeine 2)  ! * Antihistamines 3)  ! Ace Inhibitors  Past History:  Past Medical History: Last updated: 11/21/2007 esophageal stricture Diabetes mellitus, type II Hyperlipidemia Hypertension asthma as child DJD Depression Peripheral neuropathy Allergic rhinitis  Review of Systems  The patient complains of weight gain.         denies hypoglycemia  Physical Exam  General:  obese.  no distress  Skin:  insulin injection sites at anterior abdomen are normal  Additional Exam:  Hemoglobin A1C       [H]  9.1 %   Impression & Recommendations:  Problem # 1:  DIABETES MELLITUS, TYPE II (ICD-250.00) needs increased rx  Medications Added to Medication List This Visit: 1)  Humalog Kwikpen 100 Unit/ml Soln (Insulin lispro (human)) .... Three times a day (qac) 40-40-90 2)  Hydrochlorothiazide 25 Mg Tabs (Hydrochlorothiazide) .[name].. 1 tab daily  Other Orders: TLB-A1C / Hgb A1C (Glycohemoglobin) (83036-A1C) Est. Patient Level III (01007)  Patient Instructions: 1)  continue lantus 110 units at night. 2)  increase  humalog to (just before each meal) 40-40-90 units 3)  physical in 2-3 months. 4)  i advised pt to continue to pursue bariatric surgery. 5)  tests are being ordered for you today.  a few days after the test(s), please call [phone] to hear your test results.  this is very important to do because the results may change the instructions you see here 6)  (update: i left message on phone-tree:  rx as we discussed) Prescriptions: HUMALOG KWIKPEN 100 UNIT/ML  SOLN (INSULIN LISPRO (HUMAN)) three times a day (qac) 40-40-90  #4 boxes x 11   Entered and Authorized by:   Donavan Foil MD   Signed by:   Donavan Foil MD on 07/15/2009   Method used:   Electronically to        [facility]. [phone]* (retail)       [address]       [place], [place]  [address]       Ph: [phone]       Fax: [phone]   RxID:   [id number]

## 2010-12-12 NOTE — Letter (Signed)
Summary: Generic Letter  [facility]  [address] [place], [place] [address]   Phone: [phone]  Fax: [phone]    01/22/2010  [facility] [address] [place], [place]  [address]  Dear Ms. Rahl,  You suffer from diabetes, hypertension, nash, depression, edema, osteoarthritis, and dyslipidemia, all of which would be expected to improve after bariatric surgery.   In my opinion, you would benefit greatly from bariatric surgery (either roux-en-y or "Lap-band").  I would be happy to follow you postoperatively, draw necessary labs, and forward the results to your surgeon.   Sincerely,   Romero Belling MD   Appended Document: Generic Letter Faxed to the Bariatric Surgery [phone]

## 2010-12-12 NOTE — Assessment & Plan Note (Signed)
Summary: FU-R/S FROM BUMP ON 11/21/07-STC   Vital Signs:  Patient Profile:   55 Years Old Female Weight:      260.2 pounds Temp:     98.5 degrees F oral Pulse rate:   87 / minute BP sitting:   137 / 73  (right arm) Cuff size:   large  Vitals Entered By: Tomma Lightning (December 26, 2007 10:15 AM)                 Visit Type:  Follow-up Visit  Chief Complaint:  dm.  History of Present Illness: 1 hr late today now on lantus 150 units qam, and humalog 50 once daily, with evening meal.  cbg's elevated, often over 200, at all times of day.   takes meds as rx'ed for htn pt c/o edema recently    Current Allergies: ! CODEINE ! * ANTIHISTAMINES ! ACE INHIBITORS  Past Medical History:    Reviewed history from 11/21/2007 and no changes required:       esophageal stricture       Diabetes mellitus, type II       Hyperlipidemia       Hypertension       asthma as child       DJD       Depression       Peripheral neuropathy       Allergic rhinitis     Review of Systems       denies hypoglycemia, but pt c/o weight gain   Physical Exam  General:     obese.   Extremities:     1+ left pedal edema and 1+ right pedal edema.      Impression & Recommendations:  Problem # 1:  DIABETES MELLITUS, TYPE II (ICD-250.00) Assessment: Improved  Her updated medication list for this problem includes:    Metformin Hcl 500 Mg Tabs (Metformin hcl) .[name]... Take 1 by mouth bid    Lantus 100 Unit/ml Soln (Insulin glargine) .[name]... Take 140 units subcutaneously qd    Humalog Kwikpen 100 Unit/ml Soln (Insulin lispro (human)) .Marland KitchenMarland KitchenMarland KitchenMarland Kitchen 50 units once daily (before supper)  Orders: Est. Patient Level IV (49675)   Problem # 2:  HYPERTENSION (ICD-401.9)  The following medications were removed from the medication list:    Hydrochlorothiazide 25 Mg Tabs (Hydrochlorothiazide) .[name]... Take 1 by mouth qd  Her updated medication list for this problem includes:    Micardis 40 Mg Tabs (Telmisartan) .[name]...  Take 1 by mouth qd    Dyazide 37.5-25 Mg Caps (Triamterene-hctz) ..... Qd    Lasix 20 Mg Tabs (Furosemide) ..... Qd   Problem # 3:  edema  Medications Added to Medication List This Visit: 1)  Humalog Kwikpen 100 Unit/ml Soln (Insulin lispro (human)) .... 50 units once daily (before supper) 2)  Dyazide 37.5-25 Mg Caps (Triamterene-hctz) .... Qd 3)  Lasix 20 Mg Tabs (Furosemide) .... Qd 4)  Lyrica 150 Mg Caps (Pregabalin) .... Two times a day--dr sikora   Patient Instructions: 1)  same humalog 2)  increase lantus until cbg's at some time of day are low-100's 3)  same bp meds for now 4)   ret 14d 5)  add lasix 20/d 6)  change hctz to dyazide 1/d    Prescriptions: LASIX 20 MG  TABS (FUROSEMIDE) qd  #30 x 11   Entered and Authorized by:   Donavan Foil MD   Signed by:   Donavan Foil MD on 12/26/2007   Method used:  Electronically sent to ...       [facility]  [place]. [place], [place]  [address]       Ph: [phone]       Fax: [phone]   RxID:   [id number] HUMALOG KWIKPEN 100 UNIT/ML  SOLN (INSULIN LISPRO (HUMAN)) 50 units once daily (before supper)  #2 x 11   Entered and Authorized by:   Donavan Foil MD   Signed by:   Donavan Foil MD on 12/26/2007   Method used:   Electronically sent to ...       [facility]  [address] [place], [place]  [address]       Ph: [phone]       Fax: [phone]   RxID:   [id number] DYAZIDE 37.5-25 MG  CAPS (TRIAMTERENE-HCTZ) qd  #30 x 11   Entered and Authorized by:   Donavan Foil MD   Signed by:   Donavan Foil MD on 12/26/2007   Method used:   Electronically sent to ...       [facility]  [place]. [place], [place]  [address]       Ph: [phone]       Fax: [phone]   RxID:   [id number]  ]

## 2010-12-12 NOTE — Letter (Signed)
Summary: Surgical clearance/[facility]  Surgical clearance/[facility]   Imported By: Bubba Hales 10/04/2008 12:44:02  _____________________________________________________________________  External Attachment:    Type:   Image     Comment:   External Document

## 2010-12-12 NOTE — Assessment & Plan Note (Signed)
Summary: 3 MTH FU PER  PT $50   STC   Vital Signs:  Patient profile:   55 year old female Height:      72 inches Weight:      285 pounds BMI:     38.79 Pulse rate:   82 / minute BP sitting:   138 / 74  (left arm)  Vitals Entered By: Charlynne Cousins CMA (May 20, 2009 10:44 AM) CC: pt here for fu pt states she has changed the dose on her humalog to 30-30-60 , I am ordering her chart to check immunizations and preventive screening dates/ ab   CC:  pt here for fu pt states she has changed the dose on her humalog to 30-30-60  and I am ordering her chart to check immunizations and preventive screening dates/ ab.  History of Present Illness: see above. no cbg record, but states cbg's are not well-controlled.  she says her diet is poor.   pt c/o persistent edema and doe pt c/o chronic weight gain.  she says she first noticed herself being overweight when she was 55 years old.  Current Medications (verified): 1)  Metformin Hcl 500 Mg  Tabs (Metformin Hcl) .... Take 2 By Mouth Bid 2)  Protonix 40 Mg  Tbec (Pantoprazole Sodium) .... Take 1 By Mouth Qd 3)  Micardis 40 Mg  Tabs (Telmisartan) .... Take 1 By Mouth Qd 4)  Ibuprofen 800 Mg  Tabs (Ibuprofen) .... Take 1 By Mouth Two Times A Day Once Daily Prn 5)  Adult Aspirin Low Strength 81 Mg  Tbdp (Aspirin) .... Take 1 By Mouth Qd 6)  Lantus 100 Unit/ml  Soln (Insulin Glargine) .... Take 90 Units Subcutaneously Qd 7)  Humalog Kwikpen 100 Unit/ml  Soln (Insulin Lispro (Human)) .... Three Times A Day (Qac) 15-15-40 8)  Lyrica 150 Mg  Caps (Pregabalin) .... Two Times A Day--Dr Blenda Mounts 9)  Insupen Ultrafin 29g X 93m Misc (Insulin Pen Needle) .... Use As Directed 10)  Triamcinolone Acetonide 0.025 % Crea (Triamcinolone Acetonide) .... Three Times A Day As Needed Rash 11)  Onetouch Ultra Test  Strp (Glucose Blood) .... Three Times A Day, and Lancets 250.01 12)  Tramadol Hcl 50 Mg Tabs (Tramadol Hcl) .... Take 1-2 Q 4-6 Hours Prn 13)  Methocarbamol 500 Mg  Tabs (Methocarbamol) .... Take 1 Q 6 Hours Prn 14)  Metanx 2.8-25-2 Mg Tabs (L-Methylfolate-B6-B12) .... Take 1 Two Times A Day 15)  Docusate Sodium 250 Mg Caps (Docusate Sodium) .... Take 1 Two Times A Day 16)  Actos 45 Mg Tabs (Pioglitazone Hcl) .... Qd 17)  Metoprolol-Hydrochlorothiazide 50-25 Mg Tabs (Metoprolol-Hydrochlorothiazide) ..[name]. 1 Daily 18)  Cvs Loratadine 10 Mg Tabs (Loratadine) ..[name]. 1 Qd 19)  Flonase 50 Mcg/act Susp (Fluticasone Propionate) .... 2 Sprays Each Side Qd  Allergies (verified): 1)  ! Codeine 2)  ! * Antihistamines 3)  ! Ace Inhibitors  Past History:  Past Medical History: Last updated: 11/21/2007 esophageal stricture Diabetes mellitus, type II Hyperlipidemia Hypertension asthma as child DJD Depression Peripheral neuropathy Allergic rhinitis  Review of Systems  The patient denies hypoglycemia and chest pain.    Physical Exam  General:  obese.  no distress  Lungs:  Clear to auscultation bilaterally. Normal respiratory effort.  Extremities:  1+ right pedal edema and 1+ left pedal edema.   Additional Exam:  D-Dimer  0.56 ug/mL-FEU  B-Type Natriuetic Peptide    12.0 pg/mL  AST                  [  H]  56 U/L                      0-37  ALT                  [H]  66 U/L                      0-35   FastTSH                   1.55 uIU/mL                 0.35-5.50   Hemoglobin A1C       [H]  8.5 %            Cholesterol LDL      224.9 mg/dL   Impression & Recommendations:  Problem # 1:  DIABETES MELLITUS, TYPE II (ICD-250.00) needs increased rx  Problem # 2:  EDEMA (ICD-782.3) prob due to actos + insulin  Problem # 3:  WEIGHT GAIN (ICD-783.1)  Problem # 4:  HYPERTENSION (ICD-401.9) well-controlled  Problem # 5:  FATTY LIVER DISEASE (ICD-571.8) despite the edema, pt wants to continue the actos to help this  Medications Added to Medication List This Visit: 1)  Lantus 100 Unit/ml Soln (Insulin glargine) .... Take 110 units subcutaneously  qd 2)  Humalog Kwikpen 100 Unit/ml Soln (Insulin lispro (human)) .... Three times a day (qac) 30-30-60 3)  Docusate Sodium 250 Mg Caps (Docusate sodium) .... Take 1 two times a day 4)  Ct Chest With Contrast "r/o Pe"  .... Sob, elev d-dimer  bun and creat are normal, and are on e-chart  Other Orders: T-D-Dimer Fibrin Derivatives Quantitive [phone]) Surgical Referral (Surgery) TLB-BMP (Basic Metabolic Panel-BMET) (09326-ZTIWPYK) TLB-BNP (B-Natriuretic Peptide) (83880-BNPR) TLB-Lipid Panel (80061-LIPID) TLB-Hepatic/Liver Function Pnl (80076-HEPATIC) TLB-TSH (Thyroid Stimulating Hormone) (84443-TSH) TLB-A1C / Hgb A1C (Glycohemoglobin) (83036-A1C) Est. Patient Level IV (99833)  Patient Instructions: 1)  i again encouraged pt to pursue bariatric surgery.  refer to surgeon at [facility] at pt's request. 2)  please continue the increased dosage of insulin  3)  check your blood sugar 2 times a day.  vary the time of day when you check, between before the 3 meals, and at bedtime.  also check if you have symptoms of your blood sugar being too high or too low.  please keep a record of the readings and bring it to your next appointment here.  please call us sooner if you are having low blood sugar episodes. 4)  return 4-6 weeks 5)  (update: i left message on phone-tree: ct chest is ordered.  please consider resumption of zocor) Prescriptions: LANTUS 100 UNIT/ML  SOLN (INSULIN GLARGINE) take 110 units subcutaneously qd  #4 vials x 11   Entered and Authorized by:   Donavan Foil MD   Signed by:   Donavan Foil MD on 05/20/2009   Method used:   Electronically to        [facility]. [phone]* (retail)       [place], [place]  [address]       Ph: [phone]       Fax: [phone]   RxID:   [id number] HUMALOG KWIKPEN 100 UNIT/ML  SOLN (INSULIN LISPRO (HUMAN)) three times a day (qac) 30-30-60  #3 boxes x 11   Entered and  Authorized by:   Donavan Foil MD   Signed by:   Donavan Foil MD on 05/20/2009   Method used:   Electronically to        [facility]. [phone]* (retail)       [address]       [place], [place]  [address]       Ph: [phone]       Fax: [phone]   RxID:   [id number]

## 2010-12-12 NOTE — Assessment & Plan Note (Signed)
Summary: discharged from rehab facility needs f/u $50/cd   Vital Signs:  Patient Profile:   55 Years Old Female Weight:      250.0 pounds O2 Sat:      95 % O2 treatment:    Room Air Temp:     98.2 degrees F oral Pulse rate:   88 / minute BP sitting:   112 / 60  (left arm) Cuff size:   large  Pt. in pain?   no  Vitals Entered By: Tomma Lightning (December 09, 2008 1:13 PM)                  Chief Complaint:  follow-up.  History of Present Illness: pt says her recovery from right tkr has been slow.  she was recnetly d/c'ed from rehab facility, and is no longer on nsaid. since home, she has resumed lantus 100/d.  she had to decrease to 90 due to hypoglycemia.  she also takes qac humalog 15-15-40.  cbg's have been well-controlled (low-100's).  there is no pattern throughout the day. she is back on micardis since she has been at home.      Prior Medications Reviewed Using: Patient Recall  Updated Prior Medication List: METFORMIN HCL 500 MG  TABS (METFORMIN HCL) take 2 by mouth bid PROTONIX 40 MG  TBEC (PANTOPRAZOLE SODIUM) take 1 by mouth qd MICARDIS 40 MG  TABS (TELMISARTAN) take 1 by mouth qd IBUPROFEN 800 MG  TABS (IBUPROFEN) take 1 by mouth two times a day once daily prn ADULT ASPIRIN LOW STRENGTH 81 MG  TBDP (ASPIRIN) take 1 by mouth qd LANTUS 100 UNIT/ML  SOLN (INSULIN GLARGINE) take 190 units subcutaneously qd HUMALOG KWIKPEN 100 UNIT/ML  SOLN (INSULIN LISPRO (HUMAN)) three times a day (qac) 20-20-40 DYAZIDE 37.5-25 MG  CAPS (TRIAMTERENE-HCTZ) qd LASIX 20 MG  TABS (FUROSEMIDE) qd LYRICA 150 MG  CAPS (PREGABALIN) two times a day--dr sikora HYDROCHLOROTHIAZIDE 25 MG TABS (HYDROCHLOROTHIAZIDE) take 1 by mouth qd INSUPEN ULTRAFIN 29G X 12MM MISC (INSULIN PEN NEEDLE) use as directed TRIAMCINOLONE ACETONIDE 0.025 % CREA (TRIAMCINOLONE ACETONIDE) three times a day as needed rash ONETOUCH ULTRA TEST  STRP (GLUCOSE BLOOD) check bs three times a day TRAMADOL HCL 50 MG TABS  (TRAMADOL HCL) TAKE 1-2 Q 4-6 HOURS PRN METHOCARBAMOL 500 MG TABS (METHOCARBAMOL) TAKE 1 Q 6 HOURS PRN OXYCODONE-ACETAMINOPHEN 5-325 MG TABS (OXYCODONE-ACETAMINOPHEN) TAKE 1-2 Q 4-6 HOURS PRN METANX 2.8-25-2 MG TABS (L-METHYLFOLATE-B6-B12) TAKE 1 two times a day DOCUSATE SODIUM 250 MG CAPS (DOCUSATE SODIUM) TAKE 1 at bedtime ACTOS 45 MG TABS (PIOGLITAZONE HCL) qd  Current Allergies (reviewed today): ! CODEINE ! * ANTIHISTAMINES ! ACE INHIBITORS  Past Medical History:    Reviewed history from 11/21/2007 and no changes required:       esophageal stricture       Diabetes mellitus, type II       Hyperlipidemia       Hypertension       asthma as child       DJD       Depression       Peripheral neuropathy       Allergic rhinitis  Past Surgical History:    Hysterectomy    Oophorectomy    right tkr 2010     Review of Systems  The patient denies dyspnea on exertion.         she has lost a few lbs.   Physical Exam  General:     obese.  Lungs:     clear to auscultation.  no respiratory distress     Impression & Recommendations:  Problem # 1:  DIABETES MELLITUS, TYPE II (ICD-250.00) Assessment: Improved  Problem # 2:  HYPERTENSION (ICD-401.9) well-controlled   Problem # 3:  OSTEOARTHRITIS (ICD-715.90) Assessment: Improved  Medications Added to Medication List This Visit: 1)  Adult Aspirin Low Strength 81 Mg Tbdp (Aspirin) .... Take 1 by mouth qd 2)  Lantus 100 Unit/ml Soln (Insulin glargine) .... Take 90 units subcutaneously qd 3)  Humalog Kwikpen 100 Unit/ml Soln (Insulin lispro (human)) .... Three times a day (qac) 15-15-40 4)  Onetouch Ultra Test Strp (Glucose blood) .... Three times a day, and lancets 250.01   Patient Instructions: 1)  same insulin. 2)  resume aspirin 3)  ret 2 mos 4)  same micardis and triamterene/hctz   Prescriptions: ONETOUCH ULTRA TEST  STRP (GLUCOSE BLOOD) three times a day, and lancets 250.01  #270 x 3   Entered and  Authorized by:   Donavan Foil MD   Signed by:   Donavan Foil MD on 12/09/2008   Method used:   Print then Give to Patient   RxID:   [id number] ACTOS 45 MG TABS (PIOGLITAZONE HCL) qd  #90 x 3   Entered and Authorized by:   Donavan Foil MD   Signed by:   Donavan Foil MD on 12/09/2008   Method used:   Print then Give to Patient   RxID:   [id number] PXTGGYI ULTRAFIN 29G X 12MM MISC (INSULIN PEN NEEDLE) use as directed  #270 x 3   Entered and Authorized by:   Donavan Foil MD   Signed by:   Donavan Foil MD on 12/09/2008   Method used:   Print then Give to Patient   RxID:   [id number] LASIX 20 MG  TABS (FUROSEMIDE) qd  #90 x 3   Entered and Authorized by:   Donavan Foil MD   Signed by:   Donavan Foil MD on 12/09/2008   Method used:   Print then Give to Patient   RxID:   [id number] BOFBPZW 37.5-25 MG  CAPS (TRIAMTERENE-HCTZ) qd  #90 x 3   Entered and Authorized by:   Donavan Foil MD   Signed by:   Donavan Foil MD on 12/09/2008   Method used:   Print then Give to Patient   RxID:   [id number] HUMALOG KWIKPEN 100 UNIT/ML  SOLN (INSULIN LISPRO (HUMAN)) three times a day (qac) 15-15-40  #5 boxes x 3   Entered and Authorized by:   Donavan Foil MD   Signed by:   Donavan Foil MD on 12/09/2008   Method used:   Print then Give to Patient   RxID:   [id number] LANTUS 100 UNIT/ML  SOLN (INSULIN GLARGINE) take 90 units subcutaneously qd  #9 vials x 3   Entered and Authorized by:   Donavan Foil MD   Signed by:   Donavan Foil MD on 12/09/2008   Method used:   Print then Give to Patient   RxID:   [id number] MICARDIS 40 MG  TABS (TELMISARTAN) take 1 by mouth qd  #90 x 3   Entered and Authorized by:   Donavan Foil MD   Signed by:   Donavan Foil MD on 12/09/2008   Method used:   Print then Give to Patient   RxID:   [id number] PROTONIX 40 MG  TBEC (PANTOPRAZOLE  SODIUM) take 1 by mouth qd  #90 x 3   Entered and Authorized by:    Donavan Foil MD   Signed by:   Donavan Foil MD on 12/09/2008   Method used:   Print then Give to Patient   RxID:   [id number] METFORMIN HCL 500 MG  TABS (METFORMIN HCL) take 2 by mouth bid  #360 x 3   Entered and Authorized by:   Donavan Foil MD   Signed by:   Donavan Foil MD on 12/09/2008   Method used:   Print then Give to Patient   RxID:   [id number]

## 2010-12-12 NOTE — Letter (Signed)
Summary: Medical Clearance/[facility]  Medical Clearance/[facility]   Imported By: Sherian Rein 02/03/2010 09:37:59  _____________________________________________________________________  External Attachment:    Type:   Image     Comment:   External Document

## 2010-12-12 NOTE — Assessment & Plan Note (Signed)
Summary: 2 WK FU-STC   Vital Signs:  Patient Profile:   55 Years Old Female Weight:      253.2 pounds Temp:     96.8 degrees F oral Pulse rate:   91 / minute BP sitting:   130 / 68  (right arm) Cuff size:   large  Vitals Entered By: Tomma Lightning (January 09, 2008 8:56 AM)                 Visit Type:  Follow-up Visit  Chief Complaint:  dm.  History of Present Illness: patient states her leg swelling is much better. She has some cramps in the lower extremities and wonders if her potassium could be low because of the Lasix. He currently takes Lantus 150 units a day, and Humalog 50 units with supper.    Current Allergies: ! CODEINE ! * ANTIHISTAMINES ! ACE INHIBITORS  Past Medical History:    Reviewed history from 11/21/2007 and no changes required:       esophageal stricture       Diabetes mellitus, type II       Hyperlipidemia       Hypertension       asthma as child       DJD       Depression       Peripheral neuropathy       Allergic rhinitis      Physical Exam  General:     obese.   Extremities:     trace left pedal edema and trace right pedal edema.      Impression & Recommendations:  Problem # 1:  DIABETES MELLITUS, TYPE II (ICD-250.00)  Her updated medication list for this problem includes:    Metformin Hcl 500 Mg Tabs (Metformin hcl) .[name]... Take 1 by mouth bid    Lantus 100 Unit/ml Soln (Insulin glargine) .[name]... Take 200 units subcutaneously qd    Humalog Kwikpen 100 Unit/ml Soln (Insulin lispro (human)) .Marland KitchenMarland KitchenMarland KitchenMarland Kitchen 50 units once daily (before supper)  Orders: TLB-BMP (Basic Metabolic Panel-BMET) (35465-KCLEXNT) Est. Patient Level III (70017)   Problem # 2:  edema  Problem # 3:  cramps  Medications Added to Medication List This Visit: 1)  Lantus 100 Unit/ml Soln (Insulin glargine) .... Take 200 units subcutaneously qd   Patient Instructions: 1)  same lasix 2)  check bmet 3)  same humalog 4)  increase lantus until cbg at some time of  day is 100's 5)  ret 30d    Prescriptions: LANTUS 100 UNIT/ML  SOLN (INSULIN GLARGINE) take 200 units subcutaneously qd  #7 x 0   Entered and Authorized by:   Donavan Foil MD   Signed by:   Donavan Foil MD on 01/09/2008   Method used:   Electronically sent to ...       [facility]  [place]. [place],   [address]       Ph: [phone]       Fax: [phone]   RxID:   [id number] HUMALOG KWIKPEN 100 UNIT/ML  SOLN (INSULIN LISPRO (HUMAN)) 50 units once daily (before supper)  #2 x 11   Entered and Authorized by:   Donavan Foil MD   Signed by:   Donavan Foil MD on 01/09/2008   Method used:   Print then Give to Patient   RxID:  [id number]  ] 

## 2010-12-12 NOTE — Miscellaneous (Signed)
Summary: BONE DENSITY  Clinical Lists Changes  Orders: Added new Test order of T-Lumbar Vertebral Assessment (77082) - Signed 

## 2010-12-12 NOTE — Assessment & Plan Note (Signed)
Summary: 1 MO ROV/ $50 /NWS   Vital Signs:  Patient Profile:   55 Years Old Female Weight:      255.8 pounds Temp:     98.5 degrees F oral Pulse rate:   871 / minute BP sitting:   101 / 57  (right arm) Cuff size:   large  Vitals Entered By: Tomma Lightning (February 13, 2008 3:13 PM)                 Visit Type:  Follow-up Visit  Chief Complaint:  dm.  History of Present Illness: now on lantus 190 at bedtime, and humalog 55 with supper.  cbg's are improved--now low-100's in am.  had 1 episode mild hypoglycemia (64) in aftenoon, with exercise. at that thime, was on lantus 200/day, so she reduced to 190. neuropathy limits walking.    Current Allergies: ! CODEINE ! * ANTIHISTAMINES ! ACE INHIBITORS  Past Medical History:    Reviewed history from 11/21/2007 and no changes required:       esophageal stricture       Diabetes mellitus, type II       Hyperlipidemia       Hypertension       asthma as child       DJD       Depression       Peripheral neuropathy       Allergic rhinitis     Review of Systems  The patient denies syncope.     Physical Exam  General:     obese.   Skin:     insulin injection sites at anterior abdomen are normal except for a few ecchymoses    Impression & Recommendations:  Problem # 1:  DIABETES MELLITUS, TYPE II (ICD-250.00) Assessment: Improved  Her updated medication list for this problem includes:    Metformin Hcl 500 Mg Tabs (Metformin hcl) .[name]... Take 1 by mouth bid    Lantus 100 Unit/ml Soln (Insulin glargine) .[name]... Take 200 units subcutaneously qd    Humalog Kwikpen 100 Unit/ml Soln (Insulin lispro (human)) .Marland KitchenMarland KitchenMarland KitchenMarland Kitchen 50 units once daily (before supper)  Orders: Est. Patient Level III (02334)    Patient Instructions: 1)  continue lantus 190/d 2)  continue humalog 55 with supper 3)  a1c 2 mos 250.02 4)  ret 5 mos 5)  i did handicapped parking form    ]

## 2010-12-12 NOTE — Progress Notes (Signed)
Summary: marcidis  Phone Note Call from Patient Call back at Home Phone [phone]   Caller: [phone] Call For: dr ellsion Summary of Call: per Savannah [name] call need a refill  for MICARDIS 40 MG  TABS (TELMISARTAN) take 1 by mouth once daily.[name]  [phone] [address] [place], [place]  [address] Ph: [phone] Fax: [phone] Initial call taken by: Nonah Mattes,  August 30, 2008 10:38 AM  Follow-up for Phone Call        sent renewal into pharm Follow-up by: Tomma Lightning,  August 30, 2008 10:55 AM  Additional Follow-up for Phone Call Additional follow up Details #1::        Phone Call Completed, Rx faxed  In pt informed Additional Follow-up by: Nonah Mattes,  August 30, 2008 11:01 AM      Prescriptions: MICARDIS 40 MG  TABS (TELMISARTAN) take 1 by mouth qd  #30 x 5   Entered by:   Tomma Lightning   Authorized by:   Donavan Foil MD   Signed by:   Tomma Lightning on 08/30/2008   Method used:   Electronically to        [facility]. [phone]* (retail)       [address]       [place], [place]  [address]       Ph: [phone]       Fax: [phone]   RxID:   [id number]

## 2010-12-12 NOTE — Assessment & Plan Note (Signed)
Summary: 2 WEEK FU/$50/JSS   Vital Signs:  Patient Profile:   55 Years Old Female Weight:      256.8 pounds Temp:     96.7 degrees F oral Pulse rate:   94 / minute BP sitting:   118 / 66  (left arm) Cuff size:   large  Pt. in pain?   no  Vitals Entered By: Tomma Lightning (July 30, 2008 10:40 AM)                     Chief Complaint:  2 week follow-up/ pt want flu shot.  History of Present Illness: cbg this am was 80, despite an hs-snack last night for a cbg of 110 then.  she takes lantus 210 units at bedtime.  she says her diet is improved, and this si the reason for the lower cbg's.  she seldom checks cbg at other times of day. pt says she has occasional palpitations at hs x 1 minute.  no associated sob pt states recent itchy rash on forearms--she is unable to cite precip factor.    Current Allergies: ! CODEINE ! * ANTIHISTAMINES ! ACE INHIBITORS  Past Medical History:    Reviewed history from 11/21/2007 and no changes required:       esophageal stricture       Diabetes mellitus, type II       Hyperlipidemia       Hypertension       asthma as child       DJD       Depression       Peripheral neuropathy       Allergic rhinitis     Review of Systems  The patient denies hypoglycemia, fever, and chest pain.     Physical Exam  General:     well developed, well nourished, in no acute distress Skin:     forearms: dry skin and mild red rash Additional Exam:     ecg normal today    Impression & Recommendations:  Problem # 1:  DIABETES MELLITUS, TYPE II (ICD-250.00)  Problem # 2:  palpitations  Problem # 3:  rash  Medications Added to Medication List This Visit: 1)  Lantus 100 Unit/ml Soln (Insulin glargine) .... Take 200 units subcutaneously qd 2)  Triamcinolone Acetonide 0.025 % Crea (Triamcinolone acetonide) .... Three times a day as needed rash  Other Orders: Flu Vaccine 53yr + ((95284 Admin of Therapeutic Inj (IM or Cass)  ((13244   Patient Instructions: 1)  reduce lantus to 200 units at night 2)  continue humalog 60 units before the evening meal. 3)  continue your renewed diet efforts. 4)  tac cream three times a day as needed 5)  risk of the surgery is low and acceptable, and she is therefore cleared 6)  ret 2 mos for cpx, with a1c and microalbumin 250.00 7)  and fe panel and b-12 285.9   Prescriptions: TRIAMCINOLONE ACETONIDE 0.025 % CREA (TRIAMCINOLONE ACETONIDE) three times a day as needed rash  #1 x 1   Entered and Authorized by:   SDonavan FoilMD   Signed by:   SDonavan FoilMD on 07/30/2008   Method used:   Electronically to        [facility] #[phone] (retail)       [address]      [place]  [address]  Ph: [phone]       Fax: [phone]   RxID:   [id number]  ]    Flu Vaccine Consent Questions     Do you have a history of severe allergic reactions to this vaccine? no    Any prior history of allergic reactions to egg and/or gelatin? no    Do you have a sensitivity to the preservative Thimersol? no    Do you have a past history of Guillan-Barre Syndrome? no    Do you currently have an acute febrile illness? no    Have you ever had a severe reaction to latex? no    Vaccine information given and explained to patient? yes    Are you currently pregnant? no    Lot Number:[id number]   Site Given  Left Deltoid IM

## 2010-12-12 NOTE — Assessment & Plan Note (Signed)
Summary: ?BLADDER INF/#/CD   Vital Signs:  Patient profile:   55 year old female Height:      72 inches (182.88 cm) Weight:      280.25 pounds (127.39 kg) O2 Sat:      98 % on Room air Temp:     97.4 degrees F (36.33 degrees C) oral Pulse rate:   82 / minute BP sitting:   110 / 52  (left arm) Cuff size:   large  Vitals Entered By: Gardenia Phlegm CMA (August 17, 2009 9:21 AM)  O2 Flow:  Room air CC: ? Bladder Infection X5days/ pt wants flu shot/needs prescription for BD pen needs/ Pt states she is not taking Metanx or Metoprolol/CF Is Patient Diabetic? Yes   CC:  ? Bladder Infection X5days/ pt wants flu shot/needs prescription for BD pen needs/ Pt states she is not taking Metanx or Metoprolol/CF.  History of Present Illness: 6 days of dysuria.  last uti was > 1 year ago. she has lost 15 lbs, due to her efforts. since #2, her insulin requirement has significantly decreased.  Current Medications (verified): 1)  Metformin Hcl 500 Mg  Tabs (Metformin Hcl) .... Take 2 By Mouth Bid 2)  Protonix 40 Mg  Tbec (Pantoprazole Sodium) .... Take 1 By Mouth Qd 3)  Micardis 40 Mg  Tabs (Telmisartan) .... Take 1 By Mouth Qd 4)  Ibuprofen 800 Mg  Tabs (Ibuprofen) .... Take 1 By Mouth Two Times A Day Once Daily Prn 5)  Adult Aspirin Low Strength 81 Mg  Tbdp (Aspirin) .... Take 1 By Mouth Qd 6)  Lantus 100 Unit/ml  Soln (Insulin Glargine) .... Take 50 Units Subcutaneously Qd 7)  Humalog Kwikpen 100 Unit/ml  Soln (Insulin Lispro (Human)) .... Three Times A Day (Qac) 40-40-90 8)  Lyrica 150 Mg  Caps (Pregabalin) .... Two Times A Day--Dr Blenda Mounts 9)  Insupen Ultrafin 29g X 24m Misc (Insulin Pen Needle) .... Use As Directed 10)  Triamcinolone Acetonide 0.025 % Crea (Triamcinolone Acetonide) .... Three Times A Day As Needed Rash 11)  Onetouch Ultra Test  Strp (Glucose Blood) .... Three Times A Day, and Lancets 250.01 12)  Tramadol Hcl 50 Mg Tabs (Tramadol Hcl) .... Take 1-2 Q 4-6 Hours Prn 13)   Methocarbamol 500 Mg Tabs (Methocarbamol) .... Take 1 Q 6 Hours Prn 14)  Metanx 2.8-25-2 Mg Tabs (L-Methylfolate-B6-B12) .... Take 1 Two Times A Day 15)  Docusate Sodium 250 Mg Caps (Docusate Sodium) .... Take 1 Two Times A Day 16)  Actos 45 Mg Tabs (Pioglitazone Hcl) .... Qd 17)  Metoprolol-Hydrochlorothiazide 50-25 Mg Tabs (Metoprolol-Hydrochlorothiazide) ..[name]. 1 Daily 18)  [facility] Loratadine 10 Mg Tabs (Loratadine) ..[name]. 1 Qd 19)  Flonase 50 Mcg/act Susp (Fluticasone Propionate) .... 2 Sprays Each Side Qd 20)  Ct Chest With Contrast "r/o Pe" .... Sob, Elev D-Dimer  Bun and Creat Are Normal, and Are On E-Chart 21)  Hydrochlorothiazide 25 Mg Tabs (Hydrochlorothiazide) ..[name]. 1 Tab Daily  Allergies (verified): 1)  ! Codeine 2)  ! * Antihistamines 3)  ! Ace Inhibitors  Past History:  Past Medical History: Last updated: 11/21/2007 esophageal stricture Diabetes mellitus, type II Hyperlipidemia Hypertension asthma as child DJD Depression Peripheral neuropathy Allergic rhinitis  Review of Systems  The patient denies fever and syncope.    Physical Exam  General:  obese.  no distress  Abdomen:  no suprapubic tenderness   Impression & Recommendations:  Problem # 1:  weight loss due to her efforts  Problem #  2:  DIABETES MELLITUS, TYPE II (ICD-250.00) overcontrolled due to #1  Problem # 3:  ACUTE CYSTITIS (ICD-595.0) Assessment: New  Medications Added to Medication List This Visit: 1)  Humalog Kwikpen 100 Unit/ml Soln (Insulin lispro (human)) .... Three times a day (qac) 15-15-30 units 2)  Ciprofloxacin Hcl 500 Mg Tabs (Ciprofloxacin hcl) .[name].. 1 qd 3)  Bd Pen Needle Short U/f 31g X 8 Mm Misc (Insulin pen needle) .... Qid  Other Orders: Admin 1st Vaccine (91638) Flu Vaccine 62yr + [phone] T-Urine Culture (Spectrum Order) ([phone] Est. Patient Level IV ((90300 Flu Vaccine Consent Questions     Do you have a history of severe allergic reactions to this vaccine? no     Any prior history of allergic reactions to egg and/or gelatin? no    Do you have a sensitivity to the preservative Thimersol? no    Do you have a past history of Guillan-Barre Syndrome? no    Do you currently have an acute febrile illness? no    Have you ever had a severe reaction to latex? no    Vaccine information given and explained to patient? yes    Are you currently pregnant? no    Lot Number:[id number]   Exp Date:05/11/2010   Site Given  Left Deltoid IM Flu Vaccine 364yr+ ([phone] Patient Instructions: 1)  continue lantus 50 units once daily 2)  reduce humalog to (just before each meal) 15-15-30. 3)  cipro 500 mg two times a day 4)  reduction of bp meds is declined. 5)  urine culture 6)  Please schedule a follow-up appointment in 1 month. Prescriptions: BD PEN NEEDLE SHORT U/F 31G X 8 MM MISC (INSULIN PEN NEEDLE) qid  #120 x 11   Entered and Authorized by:   SeDonavan FoilD   Signed by:   SeDonavan FoilD on 08/17/2009   Method used:   Electronically to        [facility][phone](retail)       [place]  [id number]     Ph: [phone]     Fax: [phone] RxID:   [id number] HCL 500 MG TABS (CIPROFLOXACIN HCL) 1 qd  #14 x 0   Entered and Authorized by:   SeDonavan FoilD   Signed by:   SeDonavan FoilD on 08/17/2009   Method used:   Electronically to        [facility][id number](retail)       [place]  [address]     Ph: [phone]     Fax: [phone] RxID:   [id number]  Laboratory Results   Urine Tests  Date/Time Received: 08/17/2009 9:30A.M. Date/Time Reported: 08/17/2009 9:39 A.M.  Routine Urinalysis   Color: yellow Appearance: Clear Glucose: negative   (Normal Range: Negative) Bilirubin: small   (Normal Range: Negative) Ketone: small (15)   (Normal Range: Negative) Spec. Gravity:  1.020   (Normal Range: 1.003-1.035) Blood: negative   (Normal Range: Negative) pH: 5.0   (Normal Range: 5.0-8.0) Protein: trace   (Normal Range: Negative) Urobilinogen: 0.2   (Normal Range: 0-1) Nitrite: negative   (Normal Range: Negative) Leukocyte Esterace: trace   (Normal  Range: Negative)

## 2010-12-12 NOTE — Assessment & Plan Note (Signed)
Summary: 6WK F/U/VS   Visit Type:  Follow-up Referring Provider:  Romero Belling Primary Provider:  Minus Breeding MD  CC:  6 week f/u.  History of Present Illness: 55 year old with history diabetes, uterine cancer, osteoarthritis, latent TB (never treated) with history of of having body aches, chills, headaches, and pain in neck subjective fevers once or twice a year for several years (maybe greater than 5). She had thought this was the flu. She states that she will go in hot bath and afterwards may have a temperature of 100 but at times has been as high 102.8. She has been taking chronic ibuprofen 600mg  tid. She was seen in 2009 in ER for protracted symptoms and given rx of antibiotics.  She was dehydrated and given fluids, but no underlying cause was found. Was rx with antibiotics. She did have a CTAngiogram in July 2010 which showed bilateral ground glass opacities and findings c/w granulomata, but normal lymph nodes.  Workup so far has not revealed an obvious cause for her symptoms. She has in the inteirm not had much in way of symptoms for many months. She underwent successful bariatric surgery at [facility].  Since I saw her last she has had one episode of heaches posteriorly with neck pain and fever to 102 that then resolved within a few hours. She called in to try and arrange LP but then cancelled it when she found out how much time would be involved. I spent greater than 30 minutes of face to face time counselling the patient re her fevers, possible causes and futher routes of workup. She has had a slightly low wbc in the 2-3k rane with neutrophils as low as 1.6k which raises possibility of her having infections related to neutropenia esp if it were cyclic and worsened beynd numbers we have seen.   Current Allergies (reviewed today): ! * ANTIHISTAMINES ! ACE INHIBITORS ! CAFFEINE-SODIUM BENZOATE (CAFFEINE-SODIUM BENZOATE) Past History:  Past Medical History: Last updated:  11/21/2007 esophageal stricture Diabetes mellitus, type II Hyperlipidemia Hypertension asthma as child DJD Depression Peripheral neuropathy Allergic rhinitis  Past Surgical History: Last updated: 12/09/2008 Hysterectomy Oophorectomy right tkr 2010  Family History: Last updated: 04/07/2010 Family History Diabetes 1st degree relative - both parents 2 aunts with 'liver cancer" No autoimmune disease sister has a thyroid "lump"  Social History: Last updated: 02/06/2010 works  Heritage manager Single, not sexually active, no hx of stds Former Smoker Alcohol use-yes No IVDU  Risk Factors: Alcohol Use: rarely (02/06/2010) Caffeine Use: 0 (02/06/2010) Exercise: no (02/06/2010)  Risk Factors: Smoking Status: quit (02/06/2010)  Family History: Reviewed history from 04/07/2010 and no changes required. Family History Diabetes 1st degree relative - both parents 2 aunts with 'liver cancer" No autoimmune disease sister has a thyroid "lump"  Social History: Reviewed history from 02/06/2010 and no changes required. 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, no hx of stds Former Smoker Alcohol use-yes No IVDU  Problems Prior to Update: 1)  Ingrown Toenail  (ICD-703.0) 2)  Thyroid Nodule  (ICD-241.0) 3)  Fever, Hx of  (ICD-V15.9) 4)  Headache  (ICD-784.0) 5)  Tb Skin Test, Positive  (ICD-795.5) 6)  Pyuria  (ICD-791.9) 7)  Abdominal Pain, Chronic  (ICD-789.00) 8)  Encounter For Long-term Use of Other Medications  (ICD-V58.69) 9)  Fever Unspecified  (ICD-780.60) 10)  Acute Cystitis  (ICD-595.0) 11)  Fatty Liver Disease  (ICD-571.8) 12)  Edema  (ICD-782.3) 13)  Dyspnea  (ICD-786.05) 14)  Weight Gain  (ICD-783.1) 15)  Routine General Medical Exam@health  Care Facl  (ICD-V70.0) 16)  Asymptomatic Postmenopausal Status  (ICD-V49.81) 17)  Leukopenia, Mild  (ICD-288.50) 18)  Eosinophilic Esophagitis  (ICD-530.13) 19)  Other Chronic  Nonalcoholic Liver Disease  (ICD-571.8) 20)  Osteoarthritis  (ICD-715.90) 21)  Hepatomegaly  (ICD-789.1) 22)  Obesity  (ICD-278.00) 23)  Allergic Rhinitis  (ICD-477.9) 24)  Peripheral Neuropathy  (ICD-356.9) 25)  Depression  (ICD-311) 26)  Sinusitis- Acute-nos  (ICD-461.9) 27)  Family History Diabetes 1st Degree Relative  (ICD-V18.0) 28)  Hypertension  (ICD-401.9) 29)  Hyperlipidemia  (ICD-272.4) 30)  Diabetes Mellitus, Type II  (ICD-250.00)  Medications Prior to Update: 1)  Protonix 40 Mg  Tbec (Pantoprazole Sodium) .... Take 1 By Mouth Qd 2)  Lyrica 150 Mg  Caps (Pregabalin) .[name].. 1 Tab Two Times A Day 3)  Triamcinolone Acetonide 0.025 % Crea (Triamcinolone Acetonide) .... Three Times A Day As Needed Rash 4)  Docusate Sodium 250 Mg Caps (Docusate Sodium) .... Take 1 Two Times A Day 5)  Cvs Loratadine 10 Mg Tabs (Loratadine) .[name].. 1 Qd 6)  Flonase 50 Mcg/act Susp (Fluticasone Propionate) .... 2 Sprays Each Side Qd 7)  Bd Pen Needle Short U/f 31g X 8 Mm Misc (Insulin Pen Needle) .... Qid 8)  Kombiglyze Xr 2.03-999 Mg Xr24h-Tab (Saxagliptin-Metformin) .... 2 Tabs Each Am 9)  Actigall 300 Mg Caps (Ursodiol) .[name].. 1 Tablet Two Times A Day 10)  Tums .... 2 Tabs 3x Day 11)  Multivitamin .[name].. 3 Daily 12)  Pepto Bismol .[name].. 1 T Qid 13)  Simvastatin 40 Mg Tabs (Simvastatin) .[name].. 1 Once Daily  Current Medications (verified): 1)  Protonix 40 Mg  Tbec (Pantoprazole Sodium) .... Take 1 By Mouth Qd 2)  Lyrica 150 Mg  Caps (Pregabalin) .[name].. 1 Tab Two Times A Day 3)  Triamcinolone Acetonide 0.025 % Crea (Triamcinolone Acetonide) .... Three Times A Day As Needed Rash 4)  Docusate Sodium 250 Mg Caps (Docusate Sodium) .... Take 1 Two Times A Day 5)  Cvs Loratadine 10 Mg Tabs (Loratadine) .[name].. 1 Qd 6)  Flonase 50 Mcg/act Susp (Fluticasone Propionate) .... 2 Sprays Each Side Qd 7)  Bd Pen Needle Short U/f 31g X 8 Mm Misc (Insulin Pen Needle) .... Qid 8)  Kombiglyze Xr 2.03-999 Mg Xr24h-Tab  (Saxagliptin-Metformin) .... 2 Tabs Each Am 9)  Actigall 300 Mg Caps (Ursodiol) .[name].. 1 Tablet Two Times A Day 10)  Tums .... 2 Tabs 3x Day 11)  Multivitamin .[name].. 3 Daily 12)  Pepto Bismol .[name].. 1 T Qid 13)  Simvastatin 40 Mg Tabs (Simvastatin) .[name].. 1 Once Daily  Allergies: 1)  ! * Antihistamines 2)  ! Ace Inhibitors 3)  ! Caffeine-Sodium Benzoate (Caffeine-Sodium Benzoate)   Review of Systems       The patient complains of fever.  The patient denies anorexia, weight loss, weight gain, vision loss, decreased hearing, hoarseness, chest pain, syncope, dyspnea on exertion, peripheral edema, prolonged cough, headaches, hemoptysis, abdominal pain, melena, hematochezia, severe indigestion/heartburn, hematuria, incontinence, genital sores, muscle weakness, suspicious skin lesions, transient blindness, difficulty walking, depression, unusual weight change, abnormal bleeding, enlarged lymph nodes, and angioedema.    Vital Signs:  Patient profile:   55 year old female Height:      72 inches (182.88 cm) Weight:      226.8 pounds (103.09 kg) BMI:     30.87 Temp:     97.8 degrees F (36.56 degrees C) oral Pulse rate:   66 / minute BP  sitting:   120 / 78  (left arm)  Vitals Entered By: Starleen Arms CMA (May 31, 2010 10:12 AM) CC: 6 week f/u Is Patient Diabetic? Yes Did you bring your meter with you today? No Pain Assessment Patient in pain? no      Nutritional Status BMI of > 30 = obese Nutritional Status Detail nl had gastric bypass  Does patient need assistance? Functional Status Self care Ambulation Normal   Physical Exam  General:  alert, well-nourished, well-hydrated, and overweight-appearing.   Head:  normocephalic and atraumatic.  thick neck Eyes:  vision grossly intact, pupils equal, and pupils round.   Ears:  no external deformities and ear piercing(s) noted.   Nose:  no external deformity and no nasal discharge.   Mouth:  pharynx pink and moist, no erythema, and no  exudates.  Lungs:  Clear to auscultation bilaterally. Normal respiratory effort.  Heart:  Regular rate and rhythm without murmurs or gallops noted. Normal S1,S2.  regular rhythm, no murmur, and no gallop.   Abdomen:  soft, non-tender, normal bowel sounds, and no distention.   Msk:  normal ROM.   Extremities:  2+ left pedal edema and 2+ right pedal edema.   Neurologic:  alert & oriented X3, strength normal in all extremities, and gait normal.     Impression & Recommendations:  Problem # 1:  FEVER, HX OF (ICD-V15.9)  I do not have clear cause for these but they have abated recently and are occuring less frequently. I do wonder about possible cyclic worsening of her leukopenia and neutropenia (which is mild at baseline) that might be giving her cyclic neutropenic fevers. My next step in workup--if we wer to pursue it at this time wuld be to get a bone marrow biopsy with pathology results and cultures for bacteria, afb and fungi and depedning on results referral to oncology and then consideration for GCSF use. However at this pt I am going to hold off on this. In the interim I am going to have her seen in health dept for treatment of LTB as my suspicion for active TB infection is quite low. I dont think LP is going to be high yield outside of acute setting of her symptoms and I dont htink she has chronic meningitis, though she could have recurrent mollarets (HSV2) meningitis though I doubt this as well.  Orders: Est. Patient Level V (16109)  Problem # 2:  THYROID NODULE (ICD-241.0)  Needs to have this biopsied per Dr. Floria Raveling recs  Orders: Est. Patient Level V (60454)  Problem # 3:  TB SKIN TEST, POSITIVE (ICD-795.5) Assessment: Comment Only  referring to health dept for treatment for this  Orders: Est. Patient Level V (09811)  Patient Instructions: 1)  I can refer you to the health department for treatment for latent TB 2)  We will bring you back in 5 months with Dr. Daiva Eves

## 2010-12-12 NOTE — Assessment & Plan Note (Signed)
Summary: sinus infection dr Loanne Drilling pt /dd   Vital Signs:  Patient Profile:   55 Years Old Female Weight:      253 pounds Temp:     97 degrees F oral Pulse rate:   80 / minute BP sitting:   128 / 78  (right arm) Cuff size:   large  Pt. in pain?   no  Vitals Entered By: Elveria Royals (November 21, 2007 9:12 AM)                  Chief Complaint:  congestion.  History of Present Illness: here with acute onset sinus pain, pressure, fever and greenish d/c for several days  Current Allergies (reviewed today): ! CODEINE ! * ANTIHISTAMINES ! ACE INHIBITORS Updated/Current Medications (including changes made in today's visit):  METFORMIN HCL 500 MG  TABS (METFORMIN HCL) take 1 by mouth bid PROTONIX 40 MG  TBEC (PANTOPRAZOLE SODIUM) take 1 by mouth qd HYDROCHLOROTHIAZIDE 25 MG  TABS (HYDROCHLOROTHIAZIDE) take 1 by mouth qd MICARDIS 40 MG  TABS (TELMISARTAN) take 1 by mouth qd IBUPROFEN 800 MG  TABS (IBUPROFEN) take 1 by mouth two times a day once daily prn ADULT ASPIRIN LOW STRENGTH 81 MG  TBDP (ASPIRIN) take 1 by mouth qd LANTUS 100 UNIT/ML  SOLN (INSULIN GLARGINE) take 140 units subcutaneously qd HUMALOG KWIKPEN 100 UNIT/ML  SOLN (INSULIN LISPRO (HUMAN)) 40 units once daily (before supper) AZITHROMYCIN 250 MG  TABS (AZITHROMYCIN) 2po qd for 1 day, then 1po qd for 4days, then stop   Past Medical History:    Reviewed history from 07/04/2007 and no changes required:       esophageal stricture       Diabetes mellitus, type II       Hyperlipidemia       Hypertension       asthma as child       DJD       Depression       Peripheral neuropathy       Allergic rhinitis  Past Surgical History:    Reviewed history and no changes required:       Hysterectomy       Oophorectomy   Family History:    Reviewed history from 07/04/2007 and no changes required:       Family History Diabetes 1st degree relative - both parents       2 aunts with 'liver cancer"  Social  History:    Reviewed history from 07/04/2007 and no changes required:       works  Education officer, environmental       Single       Former Smoker       Alcohol use-yes   Risk Factors:  Alcohol use:  yes    Physical Exam  General:     mild ill Head:     Normocephalic and atraumatic without obvious abnormalities. No apparent alopecia or balding. Eyes:     No corneal or conjunctival inflammation noted. EOMI. Perrla.  Ears:     External ear exam shows no significant lesions or deformities.  Otoscopic examination reveals clear canals, tympanic membranes are intact bilaterally without bulging, retraction, inflammation or discharge. Hearing is grossly normal bilaterally. Nose:     External nasal examination shows no deformity or inflammation. Nasal mucosa are pink and moist without lesions or exudates. Mouth:     Oral mucosa and oropharynx without lesions or exudates.  Teeth in good repair. Neck:  cervical lymphadenopathy.   Lungs:     Normal respiratory effort, chest expands symmetrically. Lungs are clear to auscultation, no crackles or wheezes. Heart:     Normal rate and regular rhythm. S1 and S2 normal without gallop, murmur, click, rub or other extra sounds. Extremities:     no edema    Impression & Recommendations:  Problem # 1:  SINUSITIS- ACUTE-NOS (ICD-461.9) tx as below, as well as mucinex otc Her updated medication list for this problem includes:    Azithromycin 250 Mg Tabs (Azithromycin) .[name]... 2po qd for 1 day, then 1po qd for 4days, then stop   Problem # 2:  HYPERTENSION (ICD-401.9)  Her updated medication list for this problem includes:    Hydrochlorothiazide 25 Mg Tabs (Hydrochlorothiazide) .[name]... Take 1 by mouth qd    Micardis 40 Mg Tabs (Telmisartan) .[name]... Take 1 by mouth qd o/w stable, cont meds as is  Problem # 3:  DIABETES MELLITUS, TYPE II (ICD-250.00)  Her updated medication list for this problem includes:    Metformin Hcl 500 Mg Tabs (Metformin  hcl) .[name]... Take 1 by mouth bid    Micardis 40 Mg Tabs (Telmisartan) .[name]... Take 1 by mouth qd    Adult Aspirin Low Strength 81 Mg Tbdp (Aspirin) .[name]... Take 1 by mouth qd    Lantus 100 Unit/ml Soln (Insulin glargine) .[name]... Take 140 units subcutaneously qd    Humalog Kwikpen 100 Unit/ml Soln (Insulin lispro (human)) .Marland KitchenMarland KitchenMarland KitchenMarland Kitchen 40 units once daily (before supper) to cont meds; she admits to not checking any sugars for the last 2 wks - urged compliance and f/u with dr Loanne Drilling for best control  Complete Medication List: 1)  Metformin Hcl 500 Mg Tabs (Metformin hcl) .... Take 1 by mouth bid 2)  Protonix 40 Mg Tbec (Pantoprazole sodium) .... Take 1 by mouth qd 3)  Hydrochlorothiazide 25 Mg Tabs (Hydrochlorothiazide) .... Take 1 by mouth qd 4)  Micardis 40 Mg Tabs (Telmisartan) .... Take 1 by mouth qd 5)  Ibuprofen 800 Mg Tabs (Ibuprofen) .... Take 1 by mouth two times a day once daily prn 6)  Adult Aspirin Low Strength 81 Mg Tbdp (Aspirin) .... Take 1 by mouth qd 7)  Lantus 100 Unit/ml Soln (Insulin glargine) .... Take 140 units subcutaneously qd 8)  Humalog Kwikpen 100 Unit/ml Soln (Insulin lispro (human)) .... 40 units once daily (before supper) 9)  Azithromycin 250 Mg Tabs (Azithromycin) .... 2po qd for 1 day, then 1po qd for 4days, then stop   Patient Instructions: 1)  Take antibiotic as prescribed 2)  you can use the plain mucinex as needed for congestion 3)  continue to monitor your blood sugars as before 4)  Please schedule a follow-up appointment as needed wit Dr Loanne Drilling    Prescriptions: AZITHROMYCIN 250 MG  TABS (AZITHROMYCIN) 2po qd for 1 day, then 1po qd for 4days, then stop  #6 x 1   Entered and Authorized by:   Biagio Borg MD   Signed by:   Biagio Borg MD on 11/21/2007   Method used:   Print then Give to Patient   RxID:   [id number]  ]

## 2010-12-12 NOTE — Progress Notes (Signed)
Summary: appt  Phone Note Call from Patient Call back at Home Phone [phone]   Caller: Patient Summary of Call: Patient called earlier c/o fever, chills,bodyache and was advised to go to urgent care.She was seen by urgent care and was told UTI/ [place] spotted fever and to follow up with pcp. Patient can not return to work untill she is seen. She is requesting to be seen this week but no openings. Are there any appt this week, Savannah [name] off friday Initial call taken by: Estell Harpin CMA,  May 19, 2008 4:48 PM  Follow-up for Phone Call        ov today Follow-up by: Donavan Foil MD,  May 20, 2008 7:54 AM  Additional Follow-up for Phone Call Additional follow up Details #1::        May 20, 2008 8:21 AM called pt to inform that dr would like to see her for appt today left msg on machine  to call   Additional Follow-up by: Nonah Mattes,  May 20, 2008 8:22 AM         Appended Document: appt pt called back scheduled appt to day at 3:45 pm dd

## 2010-12-12 NOTE — Procedures (Signed)
[facility]   Imported By: Christian Mate 01/06/2008 16:05:14  _____________________________________________________________________  External Attachment:    Type:   Image     Comment:   External Document

## 2010-12-12 NOTE — Consult Note (Signed)
[facility]   Imported By: Sherian Rein 01/06/2010 07:54:51  _____________________________________________________________________  External Attachment:    Type:   Image     Comment:   External Document

## 2010-12-12 NOTE — Progress Notes (Signed)
  Phone Note Call from Patient Call back at Home Phone [phone]   Caller: --Patient/ Call For: Donavan Foil MD Summary of Call: per [facility] call want to go back on the MICARDIS 40 MG  TABS (TELMISARTAN) take 1 by mouth once daily,pt on coumadin will go off of that tomorrow , having low bp ,was on benicar Initial call taken by: Nonah Mattes,  November 15, 2008 3:22 PM  Follow-up for Phone Call        i can't speculate about bp by phone.  please advise ov next few days. Follow-up by: Donavan Foil MD,  November 15, 2008 3:32 PM  Additional Follow-up for Phone Call Additional follow up Details #1::        called pt to inform pt have appt on -jan 28 Additional Follow-up by: Nonah Mattes,  November 15, 2008 3:41 PM

## 2010-12-12 NOTE — Progress Notes (Signed)
Summary: one touch  Phone Note Call from Patient   Caller: Patient Call For: Minus Breeding MD Summary of Call: Patient called her  pharmacy and her prescription for her One Touch Ultra Touch Strips was denied. She stated she is almost out. Her call back number is [phone] or [phone]. She called and left msg. on triage at 12:01 pm. Initial call taken by: Robin Ewing CMA Duncan Dull),  July 28, 2010 12:14 PM  Follow-up for Phone Call        please refill prn Follow-up by: Minus Breeding MD,  July 28, 2010 12:53 PM    New/Updated Medications: ONETOUCH ULTRA BLUE  STRP (GLUCOSE BLOOD) use as directed 250.01 Prescriptions: ONETOUCH ULTRA BLUE  STRP (GLUCOSE BLOOD) use as directed 250.01  #1 month x 2   Entered by:   Brenton Grills MA   Authorized by:   Minus Breeding MD   Signed by:   Brenton Grills MA on 07/28/2010   Method used:   Electronically to        [facility]. [phone]* (retail)       [address]       [place], [place]  [address]       Ph: [phone]       Fax: [phone]   RxID:   [id number]

## 2010-12-12 NOTE — Miscellaneous (Signed)
Summary: Orders Update   Clinical Lists Changes  Orders: Added new Referral order of Surgical Referral (Surgery) - Signed

## 2010-12-12 NOTE — Op Note (Signed)
[facility]   Imported By: Bubba Hales 09/22/2010 09:35:57  _____________________________________________________________________  External Attachment:    Type:   Image     Comment:   External Document

## 2010-12-12 NOTE — Procedures (Signed)
Summary: colon & note/[facility]  colon & note/[facility]   Imported By: Bubba Hales 04/21/2008 11:06:04  _____________________________________________________________________  External Attachment:    Type:   Image     Comment:   External Document

## 2010-12-12 NOTE — Progress Notes (Signed)
Summary: UTI?  Phone Note Call from Patient Call back at Home Phone [phone]   Caller: Patient Summary of Call: pt called stating that she has been having chills, body pain, and low-grade temp x 2 days. pt also says she has been having burning and frequent urination. pt advised of office policy and told she would need an OV to get Rx for ABX Initial call taken by: Margaret Pyle, CMA,  December 01, 2009 8:57 AM

## 2010-12-12 NOTE — Consult Note (Signed)
Summary: [facility]  [facility]   Imported By: Maryln Gottron 03/22/2008 15:36:41  _____________________________________________________________________  External Attachment:    Type:   Image     Comment:   External Document

## 2010-12-12 NOTE — Assessment & Plan Note (Signed)
Summary: 3 mth fu per pt d/t-- stc   Vital Signs:  Patient profile:   55 year old female Height:      72 inches (182.88 cm) Weight:      253.50 pounds (115.23 kg) O2 Sat:      96 % on Room air Temp:     98.5 degrees F (36.94 degrees C) oral Pulse rate:   80 / minute BP sitting:   108 / 72  (left arm) Cuff size:   large  Vitals Entered By: Josph Macho RMA (Apr 07, 2010 8:45 AM)  O2 Flow:  Room air CC: 1 month follow up / pt no longer uses the BD pen needle/ CF Is Patient Diabetic? Yes   Referring Provider:  Romero Belling Primary Provider:  Minus Breeding MD  CC:  1 month follow up / pt no longer uses the BD pen needle/ CF.  History of Present Illness: the status of at least 3 ongoing medical problems is addressed today: she had bariatric surgery a few mos ago.  weight loss continues.   dm:  she feels much better recently.  no cbg record, but states cbg's are improved to the low-100's.  she is on the kombiglyze. ingrown toenails:  have recurred. thyromegaly:  was noted on ct dyslipidemia:  she takes vytorin as rx'ed.  Current Medications (verified): 1)  Protonix 40 Mg  Tbec (Pantoprazole Sodium) .... Take 1 By Mouth Qd 2)  Lyrica 150 Mg  Caps (Pregabalin) .[name].. 1 Tab Two Times A Day 3)  Triamcinolone Acetonide 0.025 % Crea (Triamcinolone Acetonide) .... Three Times A Day As Needed Rash 4)  Docusate Sodium 250 Mg Caps (Docusate Sodium) .... Take 1 Two Times A Day 5)  Cvs Loratadine 10 Mg Tabs (Loratadine) .[name].. 1 Qd 6)  Flonase 50 Mcg/act Susp (Fluticasone Propionate) .... 2 Sprays Each Side Qd 7)  Bd Pen Needle Short U/f 31g X 8 Mm Misc (Insulin Pen Needle) .... Qid 8)  Vytorin 10-80 Mg Tabs (Ezetimibe-Simvastatin) .[name].. 1 At Bedtime 9)  Kombiglyze Xr 2.03-999 Mg Xr24h-Tab (Saxagliptin-Metformin) .... 2 Tabs Each Am 10)  Actigall 300 Mg Caps (Ursodiol) .[name].. 1 Tablet Two Times A Day 11)  Tums .... 2 Tabs 3x Day 12)  Multivitamin .[name].. 3 Daily 13)  Pepto Bismol .[name].. 1 T  Qid  Allergies (verified): 1)  ! * Antihistamines 2)  ! Ace Inhibitors 3)  ! Caffeine-Sodium Benzoate (Caffeine-Sodium Benzoate)  Past History:  Past Medical History: Last updated: 11/21/2007 esophageal stricture Diabetes mellitus, type II Hyperlipidemia Hypertension asthma as child DJD Depression Peripheral neuropathy Allergic rhinitis  Family History: Family History Diabetes 1st degree relative - both parents 2 aunts with 'liver cancer" No autoimmune disease sister has a thyroid "lump"  Review of Systems  The patient denies hypoglycemia.         no infection a the ingrown toenails  Physical Exam  General:  obese.  no distress  Neck:  i can't tell for sure if i can feel the goiter. Extremities:  ingrown toenails of both great toes Additional Exam:  ALT                  [H]  36 U/L  Hemoglobin A1C       [H]  6.8 %  LDL Cholesterol           48 mg/dL   Impression & Recommendations:  Problem # 1:  OBESITY (ICD-278.00) Assessment Improved  Problem # 2:  DIABETES MELLITUS,  TYPE II (ICD-250.00) much better.  Problem # 3:  INGROWN TOENAIL (ICD-703.0) recurrent  Problem # 4:  HYPERLIPIDEMIA (ICD-272.4) Assessment: Improved  Problem # 5:  THYROID NODULE (ICD-241.0) uncertain etiology  Problem # 6:  FATTY LIVER DISEASE (ICD-571.8) Assessment: Improved  Medications Added to Medication List This Visit: 1)  Tums  .... 2 tabs 3x day 2)  Multivitamin  .[name].. 3 daily 3)  Pepto Bismol  .[name].. 1 t qid 4)  Simvastatin 40 Mg Tabs (Simvastatin) .[name].. 1 once daily  Other Orders: Radiology Referral (Radiology) Podiatry Referral (Podiatry) TLB-Hepatic/Liver Function Pnl (80076-HEPATIC) TLB-A1C / Hgb A1C (Glycohemoglobin) (83036-A1C) TLB-Lipid Panel (80061-LIPID) Est. Patient Level IV (62130)  Patient Instructions: 1)  blood tests and thyroid ultrasound are being ordered for you today.  please call [phone] to hear your test results. 2)  most of the time, a "lumpy  thyroid" will eventually become overactive.  this is usually a slow process, happening over the span of many years. 3)  Please schedule a follow-up appointment in 3 months. 4)  refer podiatry.  you will be called with a day and time for an appointment 5)  (update: i left message on phone-tree:  change vytorin to zocor 40/d) Prescriptions: SIMVASTATIN 40 MG TABS (SIMVASTATIN) 1 once daily  #30 x 11   Entered and Authorized by:   Minus Breeding MD   Signed by:   Minus Breeding MD on 04/07/2010   Method used:   Electronically to        [facility]. [phone]* (retail)       [address]       [place], [place]  [address]       Ph: [phone]       Fax: [phone]   RxID:   [id number]

## 2010-12-12 NOTE — Progress Notes (Signed)
  Phone Note Call from Patient Call back at Home Phone [phone]   Caller: Patient Reason for Call: Refill Medication Summary of Call: pt at the pharmacy want to know if she can have the insuline pens instead of what dr Loanne Drilling prescribe  for her today. she stated  that she works well with the pens. can dr ellision send an rx in Initial call taken by: Nonah Mattes,  August 29, 2007 1:07 PM  Follow-up for Phone Call        [place] 20UNITS [email or web address] . WANT TO KNOW  WHAT IS SHE SUPPOSE TO GET IS IT [place] PENS OR VALVES [phone] /PT RECEIVED A FREE TRAIL VOUCHER FOR IT    Additional Follow-up for Phone Call Additional follow up Details #2::    PER DR ELLISON EITHER IS OK/TOLD THE PHARMASIT  SPOKE WITH THEM  Follow-up by: Nonah Mattes,  August 29, 2007 2:55 PM

## 2010-12-12 NOTE — Progress Notes (Signed)
Summary: lyrica  Phone Note From Pharmacy   Caller: [facility]  [place]. #[phone]* Summary of Call: Recieved fax stating Lyrica which is a CIII drug. They are only ok for 6 months so the other refill more than 6 month will expire. Req another new script for the remaining refills. Check to see when was last refill. MD gave rx on 10/20/09 for # 60 with 11 refill. Pharmacy sent electronic request on 04/12/10 for refills. His CMA (christy) sent back denying rx stating was given 11 refills on 10/20/09. Will send remaining refills updated EMR Initial call taken by: Orlan Leavens,  April 17, 2010 11:46 AM  Follow-up for Phone Call        Faxed back paper req with new script for Lyrica. Updated EMR Follow-up by: Orlan Leavens,  April 17, 2010 11:52 AM    Prescriptions: LYRICA 150 MG  CAPS (PREGABALIN) 1 tab two times a day  #60 x 5   Entered by:   Orlan Leavens   Authorized by:   Minus Breeding MD   Signed by:   Orlan Leavens on 04/17/2010   Method used:   Telephoned to ...       [facility]  [place]. [phone]* (retail)       [address]       [place], [place]  [address]       Ph: [phone]       Fax: [phone]   RxID:   [id number]

## 2010-12-12 NOTE — Progress Notes (Signed)
Summary: Labs?  Phone Note Call from Patient Call back at Home Phone [phone]   Caller: Patient Summary of Call: pt called stating that she has CPX scheduled 12/08 and would like to know if prior labs a needed. if so, can she get labs done at another location because pt lives far away or should she wait to do labs at [facility]? Initial call taken by: Crissie Sickles, [place],  October 18, 2009 9:32 AM  Follow-up for Phone Call        better to get here, at appt.  don't worry about being fasting. Follow-up by: Donavan Foil MD,  October 18, 2009 9:37 AM  Additional Follow-up for Phone Call Additional follow up Details #1::        pt informed Additional Follow-up by: Crissie Sickles, CMA,  October 18, 2009 9:38 AM

## 2010-12-12 NOTE — Progress Notes (Signed)
Summary: Colonoscopy [facility]   Phone Note Call from Patient Call back at [facility] Phone [phone]   Caller: Patient Call For: PATTERSON Reason for Call: Talk to Nurse Summary of Call: PT NEED TO COME SEE DR Sharlett Iles FOR CONSULT B4 COL B/C SHE HAS RECTAL BLEEDING, WAS [place] FOR A COL BUT HAD TO CX B/C SHE IS A INSULIN DEP DIAB .Marland KitchenMarland KitchenBUT SHE WANTS TO BE SEEN SOONER THAN HIS FIRST AVAIL FRIDAY ON 6-26 .Marland KitchenPLZ CALL AND ADVISE CHART REQUESTED Initial call taken by: Ronalee Red,  Mar 26, 2008 4:15 PM  Follow-up for Phone Call        advised pt I would discuss with Dr. Sharlett Iles on 03-29-08. please advise. Follow-up by: Bernita Buffy CMA,  Mar 26, 2008 4:56 PM  Additional Follow-up for Phone Call Additional follow up Details #1::        after looking over the chart , Dr. Sharlett Iles has decided that the pt should go back to [facility] and have colonoscpoy done. Per verbal order I have advised the pt and she will call [facility] and have that scheduled with one of the Dr. there. Additional Follow-up by: Bernita Buffy CMA,  Mar 29, 2008 1:33 PM

## 2010-12-12 NOTE — Progress Notes (Signed)
  Phone Note Refill Request Message from:  Fax from Pharmacy on March 09, 2010 10:17 AM  Refills Requested: Medication #1:  PROTONIX 40 MG  TBEC take 1 by mouth qd   Dosage confirmed as above?Dosage Confirmed Initial call taken by: Josph Macho RMA,  March 09, 2010 10:17 AM    Prescriptions: PROTONIX 40 MG  TBEC (PANTOPRAZOLE SODIUM) take 1 by mouth qd  #30 Tablet x 4   Entered by:   Josph Macho RMA   Authorized by:   Minus Breeding MD   Signed by:   Josph Macho RMA on 03/09/2010   Method used:   Electronically to        [facility]. [phone]* (retail)       [address]       [place], [place]  [address]       Ph: [phone]       Fax: [phone]   RxID:   [id number]

## 2010-12-12 NOTE — Progress Notes (Signed)
Summary: orders needed for LP/TY  Phone Note Call from Patient   Caller: Patient Call For: [facility] Summary of Call: Radiology called and they need labs desired for the LP that will be perfomed this week. Initial call taken by: Jarrett Ables CMA,  April 25, 2010 11:50 AM  Follow-up for Phone Call        [name] I assume this pt whom I saw in April 28th is stil having fevers and we still have a need to rule out infection of her CNS? She is going to need a LARGE VOLUME LP if we are going down that road. Perhaps I should hand write the orders when Im in clinic this afternoon? I will order some through EMR if that will help Follow-up by: Alcide Evener MD,  April 25, 2010 11:57 AM

## 2010-12-12 NOTE — Assessment & Plan Note (Signed)
Summary: 2-3 MTH PHYSICAL $50--STC-RS BUMP/PHONE-STC--rs'd/cd   Vital Signs:  Patient profile:   55 year old female Height:      72 inches (182.88 cm) Weight:      283.38 pounds (128.81 kg) O2 Sat:      96 % on Room air Temp:     97.6 degrees F (36.44 degrees C) oral Pulse rate:   83 / minute BP sitting:   150 / 74  (left arm) Cuff size:   large  Vitals Entered By: Gardenia Phlegm CMA (October 20, 2009 9:19 AM)  O2 Flow:  Room air CC: 2-3 month folow up/ pt needs a refill on Lyrica/ CF Is Patient Diabetic? Yes   CC:  2-3 month folow up/ pt needs a refill on Lyrica/ CF.  History of Present Illness: here for regular wellness examination.  she's feeling pretty well in general, and does not smoke.  alcohol is rare.   Current Medications (verified): 1)  Metformin Hcl 500 Mg  Tabs (Metformin Hcl) .... Take 2 By Mouth Bid 2)  Protonix 40 Mg  Tbec (Pantoprazole Sodium) .... Take 1 By Mouth Qd 3)  Micardis 40 Mg  Tabs (Telmisartan) .... Take 1 By Mouth Qd 4)  Ibuprofen 800 Mg  Tabs (Ibuprofen) .... Take 1 By Mouth Two Times A Day Once Daily Prn 5)  Adult Aspirin Low Strength 81 Mg  Tbdp (Aspirin) .... Take 1 By Mouth Qd 6)  Lantus 100 Unit/ml  Soln (Insulin Glargine) .... Take 50 Units Subcutaneously Qd 7)  Humalog Kwikpen 100 Unit/ml  Soln (Insulin Lispro (Human)) .... Three Times A Day (Qac) 15-15-30 Units 8)  Lyrica 150 Mg  Caps (Pregabalin) .... Two Times A Day--Dr Blenda Mounts 9)  Triamcinolone Acetonide 0.025 % Crea (Triamcinolone Acetonide) .... Three Times A Day As Needed Rash 10)  Onetouch Ultra Test  Strp (Glucose Blood) .... Three Times A Day, and Lancets 250.01 11)  Methocarbamol 500 Mg Tabs (Methocarbamol) .... Take 1 Q 6 Hours Prn 12)  Docusate Sodium 250 Mg Caps (Docusate Sodium) .... Take 1 Two Times A Day 13)  Actos 45 Mg Tabs (Pioglitazone Hcl) .... Qd 14)  Cvs Loratadine 10 Mg Tabs (Loratadine) .[name].. 1 Qd 15)  Flonase 50 Mcg/act Susp (Fluticasone Propionate) .... 2  Sprays Each Side Qd 16)  Hydrochlorothiazide 25 Mg Tabs (Hydrochlorothiazide) .[name].. 1 Tab Daily 17)  Ciprofloxacin Hcl 500 Mg Tabs (Ciprofloxacin Hcl) .[name].. 1 Qd 18)  Bd Pen Needle Short U/f 31g X 8 Mm Misc (Insulin Pen Needle) .... Qid  Allergies (verified): 1)  ! Codeine 2)  ! * Antihistamines 3)  ! Ace Inhibitors  Family History: Reviewed history from 11/21/2007 and no changes required. Family History Diabetes 1st degree relative - both parents 2 aunts with 'liver cancer"  Social History: Reviewed history from 11/21/2007 and no changes required. works  Education officer, environmental Single Former Smoker Alcohol use-yes  Review of Systems       The patient complains of depression.  The patient denies fever, weight loss, weight gain, vision loss, decreased hearing, syncope, dyspnea on exertion, prolonged cough, headaches, melena, hematochezia, severe indigestion/heartburn, and hematuria.    Physical Exam  General:  obese.  no distress  Head:  head: no deformity eyes: no periorbital swelling, no proptosis external nose and ears are normal mouth: no lesion seen Neck:  Supple without thyroid enlargement or tenderness. No cervical lymphadenopathy. Breasts:  sees gyn  Lungs:  Clear to auscultation bilaterally. Normal respiratory effort.  Heart:  Regular rate and rhythm without murmurs or gallops noted. Normal S1,S2.   Rectal:  sees gyn  Genitalia:  sees gyn  Msk:  muscle bulk and strength are grossly normal.  no obvious joint swelling.  gait is normal and steady  Neurologic:  cn 2-12 grossly intact.   readily moves all 4's.   sensation is intact to touch on the feet  Skin:  normal texture and temp.  no rash.  not diaphoretic  Cervical Nodes:  No significant adenopathy.  Psych:  Alert and cooperative; normal mood and affect; normal attention span and concentration.   Additional Exam:  SEPARATE EVALUATION FOLLOWS--EACH PROBLEM HERE IS NEW, NOT RESPONDING TO TREATMENT, OR  POSES SIGNIFICANT RISK TO THE PATIENT'S HEALTH: HISTORY OF THE PRESENT ILLNESS: pt says she has had 3 episodes of fever to 102, over the past few weeks.  each episode lasts 12 hrs.   uti is noted today no improvement in chronic ruq pain PAST MEDICAL HISTORY reviewed and up to date today REVIEW OF SYSTEMS: denies dysuria and cough.  she seldom has hypoglycemia. PHYSICAL EXAMINATION: abdomen is soft, nontender.  no hepatosplenomegaly.   not distended.  no hernia no deformity.  no ulcer on the feet.  feet are of normal color and temp.  no edema dorsalis pedis intact bilat.  no carotid bruit LAB/XRAY RESULTS: a1c=7.0 ua is pos for uti IMPRESSION: dyslipidemia, needs increased rx fever, uncertain etiology uti, ? related to fever abdominal pain, ? related to uti PLAN: crestor 40/d cipro 500 mg bid   Impression & Recommendations:  Problem # 1:  ROUTINE GENERAL MEDICAL EXAM@HEALTH  CARE FACL (ICD-V70.0)  Medications Added to Medication List This Visit: 1)  Lantus 100 Unit/ml Soln (Insulin glargine) .... Take 110 units subcutaneously qd 2)  Humalog Kwikpen 100 Unit/ml Soln (Insulin lispro (human)) .... Three times a day (qac) 40-40-90 units 3)  Lyrica 150 Mg Caps (Pregabalin) .[name].. 1 tab two times a day 4)  Crestor 40 Mg Tabs (Rosuvastatin calcium) .[name].. 1 qhs  Other Orders: T-Culture, Blood Routine ([id number]) T-Urine Culture (Spectrum Order) [id number]) TLB-Lipid Panel (80061-LIPID) TLB-BMP (Basic Metabolic Panel-BMET) (78938-BOFBPZW) TLB-CBC Platelet - w/Differential (85025-CBCD) TLB-Hepatic/Liver Function Pnl (80076-HEPATIC) TLB-TSH (Thyroid Stimulating Hormone) (84443-TSH) TLB-A1C / Hgb A1C (Glycohemoglobin) (83036-A1C) TLB-Microalbumin/Creat Ratio, Urine (82043-MALB) TLB-Udip w/ Micro (81001-URINE) TLB-Sedimentation Rate (ESR) (85652-ESR) TLB-Amylase (82150-AMYL) TLB-Lipase (83690-LIPASE) Est. Patient Level IV (25852) Est. Patient 40-64 years (77824)  Patient  Instructions: 1)  we discussed the recommendations of the preventive services task force 2)  skin tags are removed from left axilla Prescriptions: CRESTOR 40 MG TABS (ROSUVASTATIN CALCIUM) 1 qhs  #30 x 11   Entered and Authorized by:   Donavan Foil MD   Signed by:   Donavan Foil MD on 10/20/2009   Method used:   Electronically to        [facility]. [phone]* (retail)       [place], [place]  [address]       Ph: [phone]       Fax: [phone]   RxID:   [id number] HUMALOG KWIKPEN 100 UNIT/ML  SOLN (INSULIN LISPRO (HUMAN)) three times a day (qac) 40-40-90 units  #8 vials x 11   Entered and Authorized by:   Donavan Foil MD   Signed by:   Donavan Foil MD on 10/20/2009   Method used:   Electronically to  [facility]  [place]. [phone]* (retail)       [place], [place]  [address]       Ph: [phone]       Fax: [phone]   RxID:   [id number] LANTUS 100 UNIT/ML  SOLN (INSULIN GLARGINE) take 110 units subcutaneously qd  #4 vials x 11   Entered and Authorized by:   Donavan Foil MD   Signed by:   Donavan Foil MD on 10/20/2009   Method used:   Electronically to        [facility]. [phone]* (retail)       [place], [place]  [address]       Ph: [phone]       Fax: [phone]   RxID:   [id number] LYRICA 150 MG  CAPS (PREGABALIN) 1 tab two times a day  #60 x 11   Entered and Authorized by:   Donavan Foil MD   Signed by:   Donavan Foil MD on 10/20/2009   Method used:   Print then Give to Patient   RxID:   [id number]    Preventive Care Screening     gyn is dr Melba Coon

## 2010-12-12 NOTE — Consult Note (Signed)
Summary: [place] By: Jerrye Noble D'jimraou 12/02/2007 12:59:47  _____________________________________________________________________  External Attachment:    Type:   Image     Comment:   External Document

## 2010-12-12 NOTE — Assessment & Plan Note (Signed)
Summary: 1 MO ROV/NWS   Vital Signs:  Patient Profile:   55 Years Old Female Weight:      250.25 pounds Temp:     97.2 degrees F oral Pulse rate:   80 / minute Pulse rhythm:   regular BP sitting:   146 / 90  (left arm)  Vitals Entered By: Tomma Lightning (October 03, 2007 9:43 AM)                 Visit Type:  f/u  Chief Complaint:  dm.  History of Present Illness: pt somewhat elusive about how her glucose is running.  no cbg record.  she has occ tremor before lunch or before supper.  she does note frequent fasting cbg's >200--she feels this is due to probable elev glucose at hs.  Current Allergies: ! CODEINE ! * ANTIHISTAMINES ! ACE INHIBITORS  Past Medical History:    Reviewed history from 07/04/2007 and no changes required:       esophageal stricture       Diabetes mellitus, type II       Hyperlipidemia       Hypertension     Review of Systems       he suspects she may have hypoglycemia during the day, but she hasn't had a recorded glucose below 70   Physical Exam  General:     obese.   Psych:     alert and cooperative; normal mood and affect; normal attention span and concentration    Impression & Recommendations:  Problem # 1:  DIABETES MELLITUS, TYPE II (ICD-250.00)  Her updated medication list for this problem includes:    Metformin Hcl 500 Mg Tabs (Metformin hcl) .[name]... Take 2 by mouth two times a day qd    Lantus 100 Unit/ml Soln (Insulin glargine) ..... See office notes    Humalog Kwikpen 100 Unit/ml Soln (Insulin lispro (human)) .Marland KitchenMarland KitchenMarland KitchenMarland Kitchen 40 units once daily (before supper)  Orders: Est. Patient Level III (52841)   Medications Added to Medication List This Visit: 1)  Humalog Kwikpen 100 Unit/ml Soln (Insulin lispro (human)) .... 40 units once daily (before supper)   Patient Instructions: 1)  check cbg two times a day/vary time of day. 2)  increase Humalog to 40 units with supper 3)  decrease Lantus to 140 units with supper 4)  Please  schedule a follow-up appointment in 1 month. 5)  pt advised to take bp meds as rx'ed    ]

## 2010-12-12 NOTE — Assessment & Plan Note (Signed)
Summary: UTI / NWS   Vital Signs:  Patient profile:   55 year old female Height:      72 inches (182.88 cm) Weight:      290.2 pounds (131.91 kg) O2 Sat:      94 % on Room air Temp:     98.6 degrees F (37.00 degrees C) oral Pulse rate:   90 / minute BP sitting:   110 / 62  (left arm) Cuff size:   large  Vitals Entered By: Orlan Leavens (December 02, 2009 8:57 AM)  O2 Flow:  Room air CC: ? UTI/ also having nasal drainage Is Patient Diabetic? Yes Did you bring your meter with you today? No Pain Assessment Patient in pain? no        Primary Care Provider:  Minus Breeding MD  CC:  ? UTI/ also having nasal drainage.  History of Present Illness: c/o feeling feverish x 2 days symptoms similar to UTI last month (+e coli Ucx) with dysuria onset symptoms 2 days ago, progressivly worse each day since start of symptoms  symptoms a/w nasal congestion and discharge not a/w HA or CP or ST +new sinus drainage in past 2 weeks but no pressure or pain in head/face only mild dysuria - no flank pain or hematuria -    Current Medications (verified): 1)  Metformin Hcl 500 Mg  Tabs (Metformin Hcl) .... Take 2 By Mouth Bid 2)  Protonix 40 Mg  Tbec (Pantoprazole Sodium) .... Take 1 By Mouth Qd 3)  Micardis 40 Mg  Tabs (Telmisartan) .... Take 1 By Mouth Qd 4)  Ibuprofen 800 Mg  Tabs (Ibuprofen) .... Take 1 By Mouth Two Times A Day Once Daily Prn 5)  Adult Aspirin Low Strength 81 Mg  Tbdp (Aspirin) .... Take 1 By Mouth Qd 6)  Lantus 100 Unit/ml  Soln (Insulin Glargine) .... Take 110 Units Subcutaneously Qd 7)  Humalog Kwikpen 100 Unit/ml  Soln (Insulin Lispro (Human)) .... Three Times A Day (Qac) 40-40-90 Units 8)  Lyrica 150 Mg  Caps (Pregabalin) .[name].. 1 Tab Two Times A Day 9)  Triamcinolone Acetonide 0.025 % Crea (Triamcinolone Acetonide) .... Three Times A Day As Needed Rash 10)  Onetouch Ultra Test  Strp (Glucose Blood) .... Three Times A Day, and Lancets 250.01 11)  Methocarbamol 500 Mg Tabs  (Methocarbamol) .... Take 1 Q 6 Hours Prn 12)  Docusate Sodium 250 Mg Caps (Docusate Sodium) .... Take 1 Two Times A Day 13)  Actos 45 Mg Tabs (Pioglitazone Hcl) .... Qd 14)  Cvs Loratadine 10 Mg Tabs (Loratadine) .[name].. 1 Qd 15)  Flonase 50 Mcg/act Susp (Fluticasone Propionate) .... 2 Sprays Each Side Qd 16)  Hydrochlorothiazide 25 Mg Tabs (Hydrochlorothiazide) .[name].. 1 Tab Daily 17)  Bd Pen Needle Short U/f 31g X 8 Mm Misc (Insulin Pen Needle) .... Qid 18)  Crestor 40 Mg Tabs (Rosuvastatin Calcium) .[name].. 1 Qhs 19)  Ciprofloxacin Hcl 500 Mg Tabs (Ciprofloxacin Hcl) .[name].. 1 Bid  Allergies (verified): 1)  ! Codeine 2)  ! * Antihistamines 3)  ! Ace Inhibitors  Past History:  Past Medical History: Last updated: 11/21/2007 esophageal stricture Diabetes mellitus, type II Hyperlipidemia Hypertension asthma as child DJD Depression Peripheral neuropathy Allergic rhinitis  Review of Systems  The patient denies anorexia, hoarseness, chest pain, dyspnea on exertion, prolonged cough, headaches, and abdominal pain.    Physical Exam  General:  obese.  no distress  Eyes:  vision grossly intact; pupils equal, round and reactive to  light.  conjunctiva and lids normal.    Ears:  normal pinnae bilaterally, without erythema, swelling, or tenderness to palpation. TMs clear, without effusion, or cerumen impaction. Hearing grossly normal bilaterally  Mouth:  teeth and gums in good repair; mucous membranes moist, without lesions or ulcers. oropharynx clear without exudate, min erythema. +PND, yellow Lungs:  Clear to auscultation bilaterally. Normal respiratory effort.  Heart:  Regular rate and rhythm without murmurs or gallops noted. Normal S1,S2.   Abdomen:  soft, non-tender, normal bowel sounds, no distention; no masses and no appreciable hepatomegaly or splenomegaly.     Impression & Recommendations:  Problem # 1:  FEVER UNSPECIFIED (ICD-780.60)  ?recurrent UTI though Udip not overwhelmingly  impressive -  will send for Ucx again given recent UTI and recurrent symptoms  also ?sinus symptoms c/w infx -  tx with augmentin to cover both -  d/w pt - recent labs and Bld cx last mo by PCP to eval same largely unremarkable -  to f/u with PCP for same if recurrent - ?uro eval or ENT eval  Orders: Prescription Created Electronically [id number]) T-Culture, Urine ([id number])  Complete Medication List: 1)  Metformin Hcl 500 Mg Tabs (Metformin hcl) .... Take 2 by mouth bid 2)  Protonix 40 Mg Tbec (Pantoprazole sodium) .... Take 1 by mouth qd 3)  Micardis 40 Mg Tabs (Telmisartan) .... Take 1 by mouth qd 4)  Ibuprofen 800 Mg Tabs (Ibuprofen) .... Take 1 by mouth two times a day once daily prn 5)  Adult Aspirin Low Strength 81 Mg Tbdp (Aspirin) .... Take 1 by mouth qd 6)  Lantus 100 Unit/ml Soln (Insulin glargine) .... Take 110 units subcutaneously qd 7)  Humalog Kwikpen 100 Unit/ml Soln (Insulin lispro (human)) .... Three times a day (qac) 40-40-90 units 8)  Lyrica 150 Mg Caps (Pregabalin) .[name].. 1 tab two times a day 9)  Triamcinolone Acetonide 0.025 % Crea (Triamcinolone acetonide) .... Three times a day as needed rash 10)  Onetouch Ultra Test Strp (Glucose blood) .... Three times a day, and lancets 250.01 11)  Methocarbamol 500 Mg Tabs (Methocarbamol) .... Take 1 q 6 hours prn 12)  Docusate Sodium 250 Mg Caps (Docusate sodium) .... Take 1 two times a day 13)  Actos 45 Mg Tabs (Pioglitazone hcl) .... Qd 14)  Cvs Loratadine 10 Mg Tabs (Loratadine) .[name].. 1 qd 15)  Flonase 50 Mcg/act Susp (Fluticasone propionate) .... 2 sprays each side qd 16)  Hydrochlorothiazide 25 Mg Tabs (Hydrochlorothiazide) .[name].. 1 tab daily 17)  Bd Pen Needle Short U/f 31g X 8 Mm Misc (Insulin pen needle) .... Qid 18)  Crestor 40 Mg Tabs (Rosuvastatin calcium) .[name].. 1 qhs 19)  Ciprofloxacin Hcl 500 Mg Tabs (Ciprofloxacin hcl) .[name].. 1 bid 20)  Augmentin 875-125 Mg Tabs (Amoxicillin-pot clavulanate) .[name].. 1 by mouth two  times a day x 7days  Other Orders: UA Dipstick w/o Micro (manual) ([id number])  Patient Instructions: 1)  augmentin for your infection - will send urine for culture again - 2)  Please schedule a follow-up appointment with dr. Everardo All as discussed , sooner if problems.  Prescriptions: AUGMENTIN 875-125 MG TABS (AMOXICILLIN-POT CLAVULANATE) 1 by mouth two times a day x 7days  #14 x 0   Entered and Authorized by:   Newt Lukes MD   Signed by:   Newt Lukes MD on 12/02/2009   Method used:   Electronically to        [facility]. [phone]* (retail)  [address]       [place], [place]  [address]       Ph: [phone]       Fax: [phone]   RxID:   [id number]   Laboratory Results   Urine Tests    Routine Urinalysis   Color: yellow Appearance: Clear Glucose: negative   (Normal Range: Negative) Bilirubin: negative   (Normal Range: Negative) Ketone: negative   (Normal Range: Negative) Spec. Gravity: 1.015   (Normal Range: 1.003-1.035) Blood: negative   (Normal Range: Negative) pH: 5.0   (Normal Range: 5.0-8.0) Protein: negative   (Normal Range: Negative) Urobilinogen: 0.2   (Normal Range: 0-1) Nitrite: negative   (Normal Range: Negative) Leukocyte Esterace: negative   (Normal Range: Negative)

## 2010-12-12 NOTE — Letter (Signed)
[facility]   Imported By: Sherian Rein 03/09/2010 10:22:39  _____________________________________________________________________  External Attachment:    Type:   Image     Comment:   External Document

## 2010-12-12 NOTE — Assessment & Plan Note (Signed)
Summary: 1 month f/u   Visit Type:  Follow-up Referring Provider:  Renato Shin Primary Provider:  Donavan Foil MD  CC:  f/u.  History of Present Illness: 55 year old with history diabetes, uterine cancer, osteoarthritis, latent TB (never tre with history of of having body aches, chills, headaches, and pain in neck subjective fevers once or twice a year for several years (maybe greater than 5). She had thought this was the flu. She states that she will go in hot bath and afterwards may have a temperature of 100 but at times has been as high 102.8. She has been taking chronic ibuprofen 648m tid. She was seen in 2009 in ER for protracted symptoms and given rx of antibiotics.  She was dehydrated and given fluids, but no underlying cause was found. Was rx with antibiotics. Since then  she has been having this nearly every monvShe did have a CTAngiogram in July 2010 which showed bilateral ground glass opacities and findings c/w granulomata, but normal lymph nodes. She states that she has a chronic cough despite not being a smoker.bPt has been gaining weight ratherthan losing it .  [name] as a cOptometristwith health care IT[place]  She has had troubles with explosive diarrhea at times, generally non bloody, once every few months. She also suffers from constipation . My workup so far including a repeat CT chest along with CT abdomen and pelvis revealed  once again calcified granuloma in the lungs and hilar lymph nodes as well as within the spleen, along with thyroid nodules. Serological workup so far only disclosed prior CMV infection. [facility]and was given levaquin preoperatively. Since then she has actually had no further fevers. Upon reviewing her case she tells me that she DOES have headaches and pain in her neck with her fevers. She  also relays that she has had a history of tooth pain and has in the past had dental abscesses.   Problems Prior to Update: 1)  Headache  (ICD-784.0) 2)  Tb Skin Test, Positive  (ICD-795.5) 3)  Pyuria  (ICD-791.9) 4)  Abdominal Pain, Chronic  (ICD-789.00) 5)  Encounter For Long-term Use of Other Medications  (ICD-V58.69) 6)  Fever Unspecified  (ICD-780.60) 7)  Acute Cystitis  (ICD-595.0) 8)  Fatty Liver Disease  (ICD-571.8) 9)  Edema  (ICD-782.3) 10)  Dyspnea  (ICD-786.05) 11)  Weight Gain  (ICD-783.1) 12)  Routine General Medical Exam@health  Care Facl  (ICD-V70.0) 13)  Asymptomatic Postmenopausal Status  (ICD-V49.81) 14)  Leukopenia, Mild  (IRDE-081.44 15)  Eosinophilic Esophagitis  (IYJE-563.14 16)  Other Chronic Nonalcoholic Liver Disease  (IHFW-263.7 17)  Osteoarthritis  (ICD-715.90) 18)  Hepatomegaly  (ICD-789.1) 19)  Obesity  (ICD-278.00) 20)  Allergic Rhinitis  (ICD-477.9) 21)  Peripheral Neuropathy  (ICD-356.9) 22)  Depression  (ICD-311) 23)  Sinusitis- Acute-nos  (ICD-461.9) 24)  Family History Diabetes 1st Degree Relative  (ICD-V18.0) 25)  Hypertension  (ICD-401.9) 26)  Hyperlipidemia  (ICD-272.4) 27)  Diabetes Mellitus, Type II  (ICD-250.00)  Medications Prior to Update: 1)  Protonix 40 Mg  Tbec (Pantoprazole Sodium) .... Take 1 By Mouth Qd 2)  Lyrica 150 Mg  Caps (Pregabalin) ..[name]. 1 Tab Two Times A Day 3)  Triamcinolone Acetonide 0.025 % Crea (Triamcinolone Acetonide) .... Three Times A Day As Needed Rash 4)  Onetouch Ultra Test  Strp (Glucose  Blood) .... Three Times A Day, and Lancets 250.01 5)  Docusate Sodium 250 Mg Caps (Docusate Sodium) .... Take 1 Two Times A Day 6)  Cvs Loratadine 10 Mg Tabs (Loratadine) .[name].. 1 Qd 7)  Flonase 50 Mcg/act Susp (Fluticasone Propionate) .... 2 Sprays Each Side Qd 8)  Bd Pen Needle Short U/f 31g X 8 Mm Misc (Insulin Pen Needle) .... Qid 9)  Vytorin 10-80 Mg Tabs (Ezetimibe-Simvastatin) .[name].. 1 At Bedtime 10)  Kombiglyze Xr  2.03-999 Mg Xr24h-Tab (Saxagliptin-Metformin) .... 2 Tabs Each Am  Current Medications (verified): 1)  Protonix 40 Mg  Tbec (Pantoprazole Sodium) .... Take 1 By Mouth Qd 2)  Lyrica 150 Mg  Caps (Pregabalin) .[name].. 1 Tab Two Times A Day 3)  Triamcinolone Acetonide 0.025 % Crea (Triamcinolone Acetonide) .... Three Times A Day As Needed Rash 4)  Docusate Sodium 250 Mg Caps (Docusate Sodium) .... Take 1 Two Times A Day 5)  Cvs Loratadine 10 Mg Tabs (Loratadine) .[name].. 1 Qd 6)  Flonase 50 Mcg/act Susp (Fluticasone Propionate) .... 2 Sprays Each Side Qd 7)  Bd Pen Needle Short U/f 31g X 8 Mm Misc (Insulin Pen Needle) .... Qid 8)  Vytorin 10-80 Mg Tabs (Ezetimibe-Simvastatin) .[name].. 1 At Bedtime 9)  Kombiglyze Xr 2.03-999 Mg Xr24h-Tab (Saxagliptin-Metformin) .... 2 Tabs Each Am 10)  Actigall 300 Mg Caps (Ursodiol) .[name].. 1 Tablet Two Times A Day  Allergies: 1)  ! * Antihistamines 2)  ! Ace Inhibitors 3)  ! Caffeine-Sodium Benzoate (Caffeine-Sodium Benzoate)    Current Allergies (reviewed today): ! * ANTIHISTAMINES ! ACE INHIBITORS ! CAFFEINE-SODIUM BENZOATE (CAFFEINE-SODIUM BENZOATE) Past History:  Past Medical History: Last updated: 11/21/2007 esophageal stricture Diabetes mellitus, type II Hyperlipidemia Hypertension asthma as child DJD Depression Peripheral neuropathy Allergic rhinitis  Past Surgical History: Last updated: 12/09/2008 Hysterectomy Oophorectomy right tkr 2010  Family History: Last updated: 02/06/2010 Family History Diabetes 1st degree relative - both parents 2 aunts with 'liver cancer" No autoimmune disease  Social History: Last updated: 02/06/2010 works  Education officer, environmental Single, not sexually active, no hx of stds Former Smoker Alcohol use-yes No IVDU  Risk Factors: Alcohol Use: rarely (02/06/2010) Caffeine Use: 0 (02/06/2010) Exercise: no (02/06/2010)  Risk Factors: Smoking Status: quit (02/06/2010)  Review of Systems  The patient  denies anorexia, fever, weight loss, weight gain, vision loss, decreased hearing, hoarseness, chest pain, syncope, dyspnea on exertion, peripheral edema, prolonged cough, headaches, hemoptysis, abdominal pain, melena, hematochezia, severe indigestion/heartburn, hematuria, incontinence, genital sores, muscle weakness, suspicious skin lesions, transient blindness, difficulty walking, depression, unusual weight change, and abnormal bleeding.    Vital Signs:  Patient profile:   55 year old female Height:      72 inches (182.88 cm) Weight:      269 pounds (122.27 kg) BMI:     36.61 Temp:     98.1 degrees F (36.72 degrees C) oral Pulse rate:   79 / minute BP sitting:   129 / 82  (left arm)  Vitals Entered By: Jarrett Ables CMA (March 09, 2010 9:03 AM) CC: f/u Is Patient Diabetic? Yes Nutritional Status BMI of > 30 = obese Nutritional Status Detail nl  Have you ever been in a relationship where you felt threatened, hurt or afraid?No   Does patient need assistance? Functional Status Self care Ambulation Normal   Physical Exam  General:  alert, well-nourished, well-hydrated, and overweight-appearing.   Head:  normocephalic and atraumatic.  thick neck Eyes:  vision grossly intact, pupils equal, and pupils round.  Ears:  no external deformities and ear piercing(s) noted.   Nose:  no external deformity and no nasal discharge.   Mouth:  pharynx pink and moist, no erythema, and no exudates.  Lungs:  Clear to auscultation bilaterally. Normal respiratory effort.  Heart:  Regular rate and rhythm without murmurs or gallops noted. Normal S1,S2.   Abdomen:  soft, non-tender, normal bowel sounds Msk:  muscle bulk and strength are grossly normal.  no obvious joint swelling.  Neurologic:  alert & oriented X3, strength normal in all extremities, and gait normal.     Impression & Recommendations:  Problem # 1:  FEVER, HX OF (ICD-V15.9) I can find no clear cute  cause for her fevers so fa.  Certainly her hx of PPD that was positive is intrigin. her calcified granulomata in the lungs and spleen however ARE NOT indicative of active TB or fungal infection but rather suggest past infection. I think we should get CT of her MFacial to evalute for dental infection. I also think that she deserves a large volume LP with cell count diff, protein glucose, culture and dedicated 8-10cc spun down for AFB, fungal cultures, as well as serologies, pCRs for chronic meningitis IF her CSF is abnormal. COuld also pursue bone marrow biopsy with culture for AFB, fungal, bacteria and pathology. I am also willing to put all or parts of such a workup on hold for now to see if her fevers recur.  Orders: CT with Contrast (CT w/ contrast) Diagnostic X-Ray/Fluoroscopy (Diagnostic X-Ray/Flu)  Problem # 2:  HEADACHE (ICD-784.0)  see above  Orders: Est. Patient Level IV (30092)  Problem # 3:  TB SKIN TEST, POSITIVE (ICD-795.5)  see above discusssion  Orders: Est. Patient Level IV (33007)  Problem # 4:  THYROID NODULE (ICD-241.0) Assessment: Unchanged  I willl alert Dr. Loanne Drilling to their being noticed on CT scan  Orders: Est. Patient Level IV (62263)  Problem # 5:  FATTY LIVER DISEASE (ICD-571.8) get bx results . Would ahve been good if they had also sent for afb and fugnal cultures Orders: Est. Patient Level IV (33545)  Medications Added to Medication List This Visit: 1)  Actigall 300 Mg Caps (Ursodiol) .[name].. 1 tablet two times a day  Patient Instructions: 1)  I would like to get the records from your liver biopsy and stay at [facility] 2)  We will arrange for CT of your head and sinuses as well as a lumbar puncture by IR 3)  Make in RTC in 4-6 wks

## 2010-12-12 NOTE — Letter (Signed)
[facility]   Imported By: Lester Kennett 04/11/2010 09:21:40  _____________________________________________________________________  External Attachment:    Type:   Image     Comment:   External Document

## 2010-12-12 NOTE — Progress Notes (Signed)
  Phone Note Call from Patient Call back at Home Phone [phone]   Caller: Patient Call For: Donavan Foil MD Summary of Call: per [facility] call need  her bone density  report and want a verbal on her lab report that was done in 09-27-08 need numbers , filling out a form  Initial call taken by: Nonah Mattes,  February 10, 2009 3:27 PM  Follow-up for Phone Call        See previous phone note, pt was told bone density was normal. Please print labs for pt and mail or have her pick them up Follow-up by: Charlsie Quest,  February 10, 2009 3:33 PM  Additional Follow-up for Phone Call Additional follow up Details #1::        i informed pt of the labs can be mailed or pick up , she said no  she want a call from a nurse to give her the numbers havent given her the bone density report results yet...she want a nurse to give those results to her over the phone Additional Follow-up by: Nonah Mattes,  February 10, 2009 3:55 PM    Additional Follow-up for Phone Call Additional follow up Details #2::    please refer the request to med records Follow-up by: Donavan Foil MD,  February 15, 2009 12:54 PM  Additional Follow-up for Phone Call Additional follow up Details #3:: Details for Additional Follow-up Action Taken: called pt to inform pt was informed and made aware Additional Follow-up by: Nonah Mattes,  February 15, 2009 3:13 PM

## 2010-12-12 NOTE — Letter (Signed)
Summary: [facility]  [facility]   Imported By: Sherian Rein 01/10/2010 07:51:27  _____________________________________________________________________  External Attachment:    Type:   Image     Comment:   External Document

## 2010-12-12 NOTE — Op Note (Signed)
Summary: [facility]  [facility]   Imported By: Lester [place] 03/08/2010 09:28:01  _____________________________________________________________________  External Attachment:    Type:   Image     Comment:   External Document

## 2010-12-12 NOTE — Assessment & Plan Note (Signed)
Summary: c/o fever, chills,bodyache /dd   Vital Signs:  Patient Profile:   55 Years Old Female Weight:      254.8 pounds Temp:     101.0 degrees F oral Pulse rate:   88 / minute Pulse rhythm:   regular BP sitting:   98 / 64  (left arm) Cuff size:   large  Vitals Entered By: Estell Harpin CMA (May 20, 2008 4:40 PM)                 Chief Complaint:  fever, chills, and bodyache.  History of Present Illness: 3d myalgias, fever, fatigue, chills, headache.  a ua was pos for uti at urgent care yesterday, and na+ was low.  also, cxr was clear yesterday.  was given cipro iv, and by mouth doxycycline.  feels much better today bp was low there cbg's have been 60-300.  has increased lantus to 220/d. doesn't know any trend throughout the day.     Current Allergies: ! CODEINE ! * ANTIHISTAMINES ! ACE INHIBITORS  Past Medical History:    Reviewed history from 11/21/2007 and no changes required:       esophageal stricture       Diabetes mellitus, type II       Hyperlipidemia       Hypertension       asthma as child       DJD       Depression       Peripheral neuropathy       Allergic rhinitis     Review of Systems       denies n/v/earache/diarrhea.  denies sore throat.   Physical Exam  General:     no distress Head:     head is normocephalic eyes: no scleral icterus no periorbital swelling perrl external ears are normal nose normal externally mouth has no lesion, including normal tongue  Neck:     no masses, thyromegaly, or abnormal cervical nodes Lungs:     clear to auscultation.  no respiratory distress  Heart:     regular rate and rhythm, S1, S2 without murmurs, rubs, gallops, or clicks Msk:     no deformity or scoliosis noted with normal posture and gait Skin:     normal texture and temp.  no rash.  not diaphoretic  Psych:     alert and cooperative; normal mood and affect; normal attention span and concentration    Impression &  Recommendations:  Problem # 1:  uti  Problem # 2:  HYPERTENSION (ICD-401.9) now overcontrolled due to # 1, or to viral illness  Problem # 3:  DIABETES MELLITUS, TYPE II (ICD-250.00) also slightly overcontrolled due to same reasons as # 2   Patient Instructions: 1)  stay-off micardis, dyazide, lasix for now 2)  reduce lantus back to 200/d 3)  i offered hospitalization--declined--go to er if you change your mind 4)  tylenol/fluids   Prescriptions: LANTUS 100 UNIT/ML  SOLN (INSULIN GLARGINE) take 200 units subcutaneously qd  #70 Millilite x 6   Entered and Authorized by:   Donavan Foil MD   Signed by:   Donavan Foil MD on 05/20/2008   Method used:   Electronically sent to ...       [facility]  [address]       [place], [place]  [address]  Ph: [phone]       Fax: [phone]   RxID:   [email or web address]  ]

## 2010-12-12 NOTE — Progress Notes (Signed)
Summary: Pt ?  Phone Note Call from Patient Call back at [facility] Phone [phone]   Caller: Patient Summary of Call: Pt called stating that her blood sugars have been low. She has decreasaed her insulin herself but this has not helped. pt also states that she thinks she has a bladder infection. I tried to return pt's called to get levels but no answer. pt also stated that she thinks she has a bladder infection. please advise Initial call taken by: Margaret Pyle, CMA,  August 16, 2009 2:16 PM  Follow-up for Phone Call        reduce lantus to 80 units once daily please offer ov for bladder infection Follow-up by: Minus Breeding MD,  August 16, 2009 2:38 PM  Additional Follow-up for Phone Call Additional follow up Details #1::        pt has been taking 80u of lantus once daily and her blood sugars have at the lowest een in the 50's. pt will make an appt for bladder infection and she will further discuss insulin with MD at OV Additional Follow-up by: Margaret Pyle, CMA,  August 16, 2009 2:57 PM    Additional Follow-up for Phone Call Additional follow up Details #2::    reduce lantus to 50 units qd Follow-up by: Minus Breeding MD,  August 16, 2009 3:45 PM  Additional Follow-up for Phone Call Additional follow up Details #3:: Details for Additional Follow-up Action Taken: pt informed, OV tomorrow @9  Additional Follow-up by: Margaret Pyle, CMA,  August 16, 2009 3:59 PM  New/Updated Medications: LANTUS 100 UNIT/ML  SOLN (INSULIN GLARGINE) take 50 units subcutaneously qd

## 2010-12-12 NOTE — Progress Notes (Signed)
Summary: tb screening  Phone Note Call from Patient   Caller: Patient Reason for Call: Talk to Doctor Summary of Call: Need proof of TB screening. Known for to be positive. Pt states she can't take the TB shot but she has had chest- ct done in the past.  Could she use that report. Last chest cxr was done back in 08 dx chest pain. does she have to have cxr repeated. Initial call taken by: Tomma Lightning,  July 29, 2009 2:12 PM  Follow-up for Phone Call        it depends on what type of form is needed. i would be happy to look at it. Follow-up by: Donavan Foil MD,  July 29, 2009 2:14 PM  Additional Follow-up for Phone Call Additional follow up Details #1::        Pt states she don't have a form will get cxr done threw her job Additional Follow-up by: Tomma Lightning,  July 29, 2009 3:02 PM

## 2010-12-12 NOTE — Procedures (Signed)
Summary: Upper [facility] & note/[facility]  Upper GI & note/[facility]   Imported By: Lester Blum 04/21/2008 11:03:32  _____________________________________________________________________  External Attachment:    Type:   Image     Comment:   External Document

## 2010-12-12 NOTE — Assessment & Plan Note (Signed)
Summary: cpx / $50 / cd   Vital Signs:  Patient Profile:   55 Years Old Female Weight:      256.0 pounds Pulse rate:   88 / minute BP sitting:   118 / 68  (right arm) Cuff size:   large  Pt. in pain?   no  Vitals Entered By: Tomma Lightning (September 29, 2008 8:57 AM)                  Chief Complaint:  CPX/ REQ SAMPLES OF MICARDIS.    Prior Medications Reviewed Using: Medication Bottles  Updated Prior Medication List: METFORMIN HCL 500 MG  TABS (METFORMIN HCL) take 2 by mouth bid PROTONIX 40 MG  TBEC (PANTOPRAZOLE SODIUM) take 1 by mouth qd MICARDIS 40 MG  TABS (TELMISARTAN) take 1 by mouth qd IBUPROFEN 800 MG  TABS (IBUPROFEN) take 1 by mouth two times a day once daily prn ADULT ASPIRIN LOW STRENGTH 81 MG  TBDP (ASPIRIN) take 1 by mouth qd LANTUS 100 UNIT/ML  SOLN (INSULIN GLARGINE) take 200 units subcutaneously qd HUMALOG KWIKPEN 100 UNIT/ML  SOLN (INSULIN LISPRO (HUMAN)) 60 units once daily (before supper) DYAZIDE 37.5-25 MG  CAPS (TRIAMTERENE-HCTZ) qd LASIX 20 MG  TABS (FUROSEMIDE) qd LYRICA 150 MG  CAPS (PREGABALIN) two times a day--dr sikora HYDROCHLOROTHIAZIDE 25 MG TABS (HYDROCHLOROTHIAZIDE) take 1 by mouth qd INSUPEN ULTRAFIN 29G X 12MM MISC (INSULIN PEN NEEDLE) use as directed TRIAMCINOLONE ACETONIDE 0.025 % CREA (TRIAMCINOLONE ACETONIDE) three times a day as needed rash ONETOUCH ULTRA TEST  STRP (GLUCOSE BLOOD) check bs three times a day TRAMADOL HCL 50 MG TABS (TRAMADOL HCL) TAKE 1-2 Q 4-6 HOURS PRN METHOCARBAMOL 500 MG TABS (METHOCARBAMOL) TAKE 1 Q 6 HOURS PRN OXYCODONE-ACETAMINOPHEN 5-325 MG TABS (OXYCODONE-ACETAMINOPHEN) TAKE 1-2 Q 4-6 HOURS PRN METANX 2.8-25-2 MG TABS (L-METHYLFOLATE-B6-B12) TAKE 1 two times a day DOCUSATE SODIUM 250 MG CAPS (DOCUSATE SODIUM) TAKE 1 at bedtime  Current Allergies (reviewed today): ! CODEINE ! * ANTIHISTAMINES ! ACE INHIBITORS   Social History:    Reviewed history from 11/21/2007 and no changes required:       works   Education officer, environmental       Single       Former Smoker       Alcohol use-yes   Risk Factors:  Colonoscopy History:     Date of Last Colonoscopy:  04/08/2008    Results:  normal    Review of Systems  The patient denies fever, weight gain, decreased hearing, chest pain, syncope, dyspnea on exertion, prolonged cough, headaches, melena, hematochezia, severe indigestion/heartburn, hematuria, and suspicious skin lesions.         has blurry vision   Physical Exam  General:     obese.   Head:     head is normocephalic eyes: no scleral icterus no periorbital swelling perrl external ears are normal nose normal externally mouth has no lesion, including normal tongue  Neck:     no masses, thyromegaly, or abnormal cervical nodes Breasts:     sees gyn  Lungs:     clear to auscultation.  no respiratory distress  Heart:     regular rate and rhythm, S1, S2 without murmurs, rubs, gallops, or clicks Abdomen:     abdomen is soft, nontender.  no hepatosplenomegaly.   not distended.  no hernia  Rectal:     sees gyn  Genitalia:     sees gyn  Msk:     no deformity or scoliosis  noted with normal posture and gait Neurologic:     cn 2-12 grossly intact.   readily moves all 4's.   sensation is intact to touch on the feet  Skin:     normal texture and temp.  no rash.  not diaphoretic  Cervical Nodes:     no significant adenopathy Psych:     alert and cooperative; normal mood and affect; normal attention span and concentration Additional Exam:     SEPARATE EVALUATION FOLLOWS--EACH PROBLEM HERE IS NEW, NOT RESPONDING TO TREATMENT, OR POSES SIGNIFICANT RISK TO THE PATIENT'S HEALTH: HISTORY OF THE PRESENT ILLNESS: pt says she has hyperglycemia at most times of the  elev lft again noted edema persists dyslipidemia is again noted PAST MEDICAL HISTORY reviewed and up to date today REVIEW OF SYSTEMS: has occasional hypoglycemia in the early hours of the am denies weight  loss PHYSICAL EXAMINATION: dorsalis pedis intact bilat.  no carotid bruit no deformity.  no ulcer on the feet.  feet are of normal color and temp.  there is 1+ bilat edema LAB/XRAY RESULTS: copy given to pt IMPRESSION: dyslipidemia pt needs matching of her insulin to her requirements edema nash--we need to weigh benefit against risk of actos PLAN: reduce humalog to 40/d, with evening meal reduce lantus to 190/d add humalog 20 units with breakfast ret 3 mos with cbc prior weight loss advised actos 45/d we'll have to address lipids when liver is better trial off lyrica      Impression & Recommendations:  Problem # 1:  ROUTINE GENERAL MEDICAL EXAM@HEALTH  CARE FACL (ICD-V70.0)  Medications Added to Medication List This Visit: 1)  Lantus 100 Unit/ml Soln (Insulin glargine) .... Take 190 units subcutaneously qd 2)  Humalog Kwikpen 100 Unit/ml Soln (Insulin lispro (human)) .... Three times a day (qac) 20-20-40 3)  Tramadol Hcl 50 Mg Tabs (Tramadol hcl) .... Take 1-2 q 4-6 hours prn 4)  Methocarbamol 500 Mg Tabs (Methocarbamol) .... Take 1 q 6 hours prn 5)  Oxycodone-acetaminophen 5-325 Mg Tabs (Oxycodone-acetaminophen) .... Take 1-2 q 4-6 hours prn 6)  Metanx 2.8-25-2 Mg Tabs (L-methylfolate-b6-b12) .... Take 1 two times a day 7)  Docusate Sodium 250 Mg Caps (Docusate sodium) .... Take 1 at bedtime 8)  Actos 45 Mg Tabs (Pioglitazone hcl) .... Qd  Other Orders: T-Bone Densitometry (47654) Est. Patient Level IV (65035)   Patient Instructions: 1)  we discussed the recommendations of the preventive services task force   Prescriptions: HUMALOG KWIKPEN 100 UNIT/ML  SOLN (INSULIN LISPRO (HUMAN)) three times a day (qac) 20-20-40  #2 boxes x 11   Entered and Authorized by:   Donavan Foil MD   Signed by:   Donavan Foil MD on 09/29/2008   Method used:   Print then Give to Patient   RxID:   [id number] LANTUS 100 UNIT/ML  SOLN (INSULIN GLARGINE) take 190 units  subcutaneously qd  #7 vials x 11   Entered and Authorized by:   Donavan Foil MD   Signed by:   Donavan Foil MD on 09/29/2008   Method used:   Print then Give to Patient   RxID:   [id number] HUMALOG KWIKPEN 100 UNIT/ML  SOLN (INSULIN LISPRO (HUMAN)) three times a day (qac) 20-20-40  #2 boxes x 11   Entered and Authorized by:   Donavan Foil MD   Signed by:   Donavan Foil MD on 09/29/2008   Method used:   Electronically to  [facility]  [place]. [phone]* (retail)       [place], [place]  [address]       Ph: [phone]       Fax: [phone]   RxID:   [id number] ACTOS 45 MG TABS (PIOGLITAZONE HCL) qd  #30 x 11   Entered and Authorized by:   Donavan Foil MD   Signed by:   Donavan Foil MD on 09/29/2008   Method used:   Electronically to        [facility]. [phone]* (retail)       [address]       [place], [place]  [address]       Ph: [phone]       Fax: [phone]   RxID:   [id number]  ]  Preventive Care Screening  Colonoscopy:    Date:  04/08/2008    Results:  normal      gyn is dr Melba Coon

## 2010-12-12 NOTE — Assessment & Plan Note (Signed)
Summary: PER AMY/DR Ruthann Cancer [facility] FU--[facility]C   Vital Signs:  Patient profile:   55 year old female Height:      72 inches (182.88 cm) Weight:      272.13 pounds (123.70 kg) O2 Sat:      96 % on Room air Temp:     98.4 degrees F (36.89 degrees C) oral Pulse rate:   79 / minute BP sitting:   118 / 60  (left arm) Cuff size:   large  Vitals Entered By: Josph Macho RMA (March 07, 2010 11:13 AM)  O2 Flow:  Room air CC: 2 week follow up/ BS this morning after fasting was 225/ pt states she is no longer taking Micardis, Ibuprofen, Metformin, Methocarbamol, Actos, Aspirin, [facility] Loratadine, hydrochlorothiazide, Lyrica, Humalog, Lantus, or using the test strips or needles/ CF Is Patient Diabetic? Yes   Referring Provider:  Romero Belling Primary Provider:  Minus Breeding MD  CC:  2 week follow up/ BS this morning after fasting was 225/ pt states she is no longer taking Micardis, Ibuprofen, Metformin, Methocarbamol, Actos, Aspirin, [facility] Loratadine, hydrochlorothiazide, Lyrica, Humalog, Lantus, and or using the test strips or needles/ CF.  History of Present Illness: the status of at least 3 ongoing medical problems is addressed today: she has lost 27 lbs since her recent bariatric surgery. htn:  she is off meds. dm:  she is off insulin.  no cbg record, but states cbg's are approx 200.  she has polyuria edema: persists.  Current Medications (verified): 1)  Metformin Hcl 500 Mg  Tabs (Metformin Hcl) .... Take 2 By Mouth Bid 2)  Protonix 40 Mg  Tbec (Pantoprazole Sodium) .... Take 1 By Mouth Qd 3)  Micardis 40 Mg  Tabs (Telmisartan) .... Take 1 By Mouth Qd 4)  Ibuprofen 800 Mg  Tabs (Ibuprofen) .... Take 1 By Mouth Two Times A Day Once Daily Prn 5)  Adult Aspirin Low Strength 81 Mg  Tbdp (Aspirin) .... Take 1 By Mouth Qd 6)  Lantus 100 Unit/ml  Soln (Insulin Glargine) .... Take 120 Units Subcutaneously Qd 7)  Humalog Kwikpen 100 Unit/ml  Soln (Insulin Lispro (Human)) .... Three Times A Day  (Qac) 35-40-85 Units 8)  Lyrica 150 Mg  Caps (Pregabalin) .[name].. 1 Tab Two Times A Day 9)  Triamcinolone Acetonide 0.025 % Crea (Triamcinolone Acetonide) .... Three Times A Day As Needed Rash 10)  Onetouch Ultra Test  Strp (Glucose Blood) .... Three Times A Day, and Lancets 250.01 11)  Methocarbamol 500 Mg Tabs (Methocarbamol) .... Take 1 Q 6 Hours Prn 12)  Docusate Sodium 250 Mg Caps (Docusate Sodium) .... Take 1 Two Times A Day 13)  Actos 45 Mg Tabs (Pioglitazone Hcl) .... Qd 14)  [facility] Loratadine 10 Mg Tabs (Loratadine) .[name].. 1 Qd 15)  Flonase 50 Mcg/act Susp (Fluticasone Propionate) .... 2 Sprays Each Side Qd 16)  Hydrochlorothiazide 25 Mg Tabs (Hydrochlorothiazide) .[name].. 1 Tab Daily 17)  Bd Pen Needle Short U/f 31g X 8 Mm Misc (Insulin Pen Needle) .... Qid 18)  Vytorin 10-80 Mg Tabs (Ezetimibe-Simvastatin) .[name].. 1 At Bedtime  Allergies (verified): 1)  ! * Antihistamines 2)  ! Ace Inhibitors 3)  ! Caffeine-Sodium Benzoate (Caffeine-Sodium Benzoate)  Past History:  Past Medical History: Last updated: 11/21/2007 esophageal stricture Diabetes mellitus, type II Hyperlipidemia Hypertension asthma as child DJD Depression Peripheral neuropathy Allergic rhinitis  Review of Systems  The patient denies dyspnea on exertion.         denies n/v  Physical Exam  General:  obese.   Extremities:  1+ right pedal edema and 1+ left pedal edema.     Impression & Recommendations:  Problem # 1:  HYPERTENSION (ICD-401.9) well-controlled on no meds  Problem # 2:  DIABETES MELLITUS, TYPE II (ICD-250.00) needs increased rx, but she may be manageable on oral agents  Problem # 3:  EDEMA (ICD-782.3) prob due to actos  Medications Added to Medication List This Visit: 1)  Kombiglyze Xr 2.03-999 Mg Xr24h-tab (Saxagliptin-metformin) .... 2 tabs each am  Other Orders: Est. Patient Level IV (51761)  Patient Instructions: 1)  stay-off blood pressure meds (hctz and micardis). 2)  stop  actos. 3)  change metformin to kombiglyze 2.03/999, 2 tabs each am. 4)  Please schedule a follow-up appointment in 1 month. Prescriptions: KOMBIGLYZE XR 2.03-999 MG XR24H-TAB (SAXAGLIPTIN-METFORMIN) 2 tabs each am  #60 x 11   Entered and Authorized by:   Minus Breeding MD   Signed by:   Minus Breeding MD on 03/07/2010   Method used:   Electronically to        [facility]. [phone]* (retail)       [address]       [place], [place]  [address]       Ph: [phone]       Fax: [phone]   RxID:   [id number]

## 2010-12-12 NOTE — Letter (Signed)
[facility]: [facility]   Imported By: Sherian Rein 08/28/2010 11:08:41  _____________________________________________________________________  External Attachment:    Type:   Image     Comment:   External Document

## 2010-12-12 NOTE — Progress Notes (Signed)
Summary: labs  Phone Note Call from Patient Call back at Home Phone [phone]   Caller: [phone] Call For: dr ellsion Summary of Call: per [facility] call  can  she have liver enzymes included with her cxp labs . if not included want it to be added per pt .. appt on Nov 18 labs are on nov 6 Initial call taken by: Nonah Mattes,  September 14, 2008 10:37 AM  Follow-up for Phone Call        yes, liver enzymes are included with physical labs Follow-up by: Donavan Foil MD,  September 14, 2008 10:40 AM  Additional Follow-up for Phone Call Additional follow up Details #1::        called pt to inform pt was notified Additional Follow-up by: Nonah Mattes,  September 14, 2008 10:50 AM

## 2010-12-12 NOTE — Assessment & Plan Note (Signed)
Summary: new pt fuo   Visit Type:  Consult Referring Provider:  Romero Belling Primary Provider:  Minus Breeding MD  CC:  new patient, unexplained chills, body aches, and and fever.  History of Present Illness: 55 year old with history diabetes, uterine cancer, osteoarthritis, latent TB (never treated) of  of having body aches, chills, headaches, and pain in neck subjective fevers once or twice a year for several years (maybe greater than 5). She had thought this was the flu. She states that she will go in hot bath and afterwards may have a temperature of 100 but at times has been as high 102.8. She has been taking chronic ibuprofen 600mg  tid. She was seen in 2009 in ER for protracted symptoms and given rx of antibiotics.  She was dehydrated and given fluids, but no underlying cause was found. Was rx with antibiotics. Since then  she has been having this nearly every month. Dr. Everardo All she says had checked urine and blood cultures and she has grown >100 e. coli in December and then 35K prior to that. She typically DOES NOT have pain with urination or lower abdominal pain with symptoms so not clear that she has been having uti, although on a few occasions she has had dysuria. She did have a CTAngiogram in July 2010 which showed bilateral ground glass opacities and findings c/w granulomata, but normal lymph nodes. She states that she has a chronic cough despite not being a smoker.  Pt has been gaining weight rather than losing it and is scheduled for bariatric surgery.  [name] born in [place], lived in [place] and then  lived in [place], [place]. 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 [place], [place], [place], [place],  She has had troubles with explosive diarrhea at times, generally non bloody, once every few months. She also suffers from constipation. She inquired about "chronic Lyme disease" and I spent considerable time explaining the nature of LYme its epidemiology areas of endemicity and  the fact that there is no chronic infection with this organism (there is post Lyme arthritis whose course is not effected by antibiotics). She says she has an area under her tonge that bothers her and becomes slighlty swollen from time to timeslighfrom time to time and which she has had since childhood. I spent greater than 60 minutes of face to face time with this pt reviewing her hx explaining the nature of [name], differential and reason for lab tests ordered.  Preventive Screening-Counseling & Management  Alcohol-Tobacco     Alcohol drinks/day: rarely     Smoking Status: quit     Year Started: 1990s  Caffeine-Diet-Exercise     Caffeine use/day: 0     Does Patient Exercise: no  Comments: no missed doses of meds per patient   Current Allergies (reviewed today): ! * ANTIHISTAMINES ! ACE INHIBITORS ! CAFFEINE-SODIUM BENZOATE (CAFFEINE-SODIUM BENZOATE) Problems Prior to Update: 1)  Pyuria  (ICD-791.9) 2)  Abdominal Pain, Chronic  (ICD-789.00) 3)  Encounter For Long-term Use of Other Medications  (ICD-V58.69) 4)  Fever Unspecified  (ICD-780.60) 5)  Acute Cystitis  (ICD-595.0) 6)  Fatty Liver Disease  (ICD-571.8) 7)  Edema  (ICD-782.3) 8)  Dyspnea  (ICD-786.05) 9)  Weight Gain  (ICD-783.1) 10)  Routine General Medical Exam@health  Care Fac[facility]  (ICD-V70.0) 11)  Asymptomatic Postmenopausal Status  (ICD-V49.81) 12)  Leukopenia, Mild  (ICD-288.50) 13)  Eosinophilic Esophagitis  (ICD-530.13) 14)  Other Chronic Nonalcoholic Liver Disease  (ICD-571.8) 15)  Osteoarthritis  (  ICD-715.90) 16)  Hepatomegaly  (ICD-789.1) 17)  Obesity  (ICD-278.00) 18)  Allergic Rhinitis  (ICD-477.9) 19)  Peripheral Neuropathy  (ICD-356.9) 20)  Depression  (ICD-311) 21)  Sinusitis- Acute-nos  (ICD-461.9) 22)  Family History Diabetes 1st Degree Relative  (ICD-V18.0) 23)  Hypertension  (ICD-401.9) 24)  Hyperlipidemia  (ICD-272.4) 25)  Diabetes Mellitus, Type II  (ICD-250.00)  Medications Prior to Update: 1)   Metformin Hcl 500 Mg  Tabs (Metformin Hcl) .... Take 2 By Mouth Bid 2)  Protonix 40 Mg  Tbec (Pantoprazole Sodium) .... Take 1 By Mouth Qd 3)  Micardis 40 Mg  Tabs (Telmisartan) .... Take 1 By Mouth Qd 4)  Ibuprofen 800 Mg  Tabs (Ibuprofen) .... Take 1 By Mouth Two Times A Day Once Daily Prn 5)  Adult Aspirin Low Strength 81 Mg  Tbdp (Aspirin) .... Take 1 By Mouth Qd 6)  Lantus 100 Unit/ml  Soln (Insulin Glargine) .... Take 120 Units Subcutaneously Qd 7)  Humalog Kwikpen 100 Unit/ml  Soln (Insulin Lispro (Human)) .... Three Times A Day (Qac) 35-40-85 Units 8)  Lyrica 150 Mg  Caps (Pregabalin) .[name].. 1 Tab Two Times A Day 9)  Triamcinolone Acetonide 0.025 % Crea (Triamcinolone Acetonide) .... Three Times A Day As Needed Rash 10)  Onetouch Ultra Test  Strp (Glucose Blood) .... Three Times A Day, and Lancets 250.01 11)  Methocarbamol 500 Mg Tabs (Methocarbamol) .... Take 1 Q 6 Hours Prn 12)  Docusate Sodium 250 Mg Caps (Docusate Sodium) .... Take 1 Two Times A Day 13)  Actos 45 Mg Tabs (Pioglitazone Hcl) .... Qd 14)  Cvs Loratadine 10 Mg Tabs (Loratadine) .[name].. 1 Qd 15)  Flonase 50 Mcg/act Susp (Fluticasone Propionate) .... 2 Sprays Each Side Qd 16)  Hydrochlorothiazide 25 Mg Tabs (Hydrochlorothiazide) .[name].. 1 Tab Daily 17)  Bd Pen Needle Short U/f 31g X 8 Mm Misc (Insulin Pen Needle) .... Qid 18)  Vytorin 10-80 Mg Tabs (Ezetimibe-Simvastatin) .[name].. 1 At Bedtime  Current Medications (verified): 1)  Metformin Hcl 500 Mg  Tabs (Metformin Hcl) .... Take 2 By Mouth Bid 2)  Protonix 40 Mg  Tbec (Pantoprazole Sodium) .... Take 1 By Mouth Qd 3)  Micardis 40 Mg  Tabs (Telmisartan) .... Take 1 By Mouth Qd 4)  Ibuprofen 800 Mg  Tabs (Ibuprofen) .... Take 1 By Mouth Two Times A Day Once Daily Prn 5)  Adult Aspirin Low Strength 81 Mg  Tbdp (Aspirin) .... Take 1 By Mouth Qd 6)  Lantus 100 Unit/ml  Soln (Insulin Glargine) .... Take 120 Units Subcutaneously Qd 7)  Humalog Kwikpen 100 Unit/ml  Soln (Insulin Lispro  (Human)) .... Three Times A Day (Qac) 35-40-85 Units 8)  Lyrica 150 Mg  Caps (Pregabalin) .[name].. 1 Tab Two Times A Day 9)  Triamcinolone Acetonide 0.025 % Crea (Triamcinolone Acetonide) .... Three Times A Day As Needed Rash 10)  Onetouch Ultra Test  Strp (Glucose Blood) .... Three Times A Day, and Lancets 250.01 11)  Methocarbamol 500 Mg Tabs (Methocarbamol) .... Take 1 Q 6 Hours Prn 12)  Docusate Sodium 250 Mg Caps (Docusate Sodium) .... Take 1 Two Times A Day 13)  Actos 45 Mg Tabs (Pioglitazone Hcl) .... Qd 14)  Cvs Loratadine 10 Mg Tabs (Loratadine) .[name].. 1 Qd 15)  Flonase 50 Mcg/act Susp (Fluticasone Propionate) .... 2 Sprays Each Side Qd 16)  Hydrochlorothiazide 25 Mg Tabs (Hydrochlorothiazide) .[name].. 1 Tab Daily 17)  Bd Pen Needle Short U/f 31g X 8 Mm Misc (Insulin Pen Needle) .... Qid  18)  Vytorin 10-80 Mg Tabs (Ezetimibe-Simvastatin) .[name].. 1 At Bedtime  Allergies: 1)  ! * Antihistamines 2)  ! Ace Inhibitors 3)  ! Caffeine-Sodium Benzoate (Caffeine-Sodium Benzoate)   Family History: Reviewed history from 11/21/2007 and no changes required. Family History Diabetes 1st degree relative - both parents 2 aunts with 'liver cancer" No autoimmune disease  Social History: Reviewed history from 11/21/2007 and no changes required. 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 Yes Did you bring your meter with you today? No Pain Assessment Patient in pain? no      Nutritional Status BMI of > 30 =  obese Nutritional Status Detail normal  Have you ever been in a relationship where you felt threatened, hurt or afraid?No   Does patient need assistance? Functional Status Self care Ambulation Normal   Physical Exam  General:  alert, well-nourished, well-hydrated, and overweight-appearing.   Head:  normocephalic and atraumatic.  thick neck Eyes:  vision grossly intact, pupils equal, and pupils round.   Ears:  no external deformities and ear piercing(s) noted.   Nose:  no external deformity and no nasal discharge.   Mouth:  pharynx pink and moist, no erythema, and no exudates.  i could not appreciate the sublingual area that bothers her from time to time Lungs:  Clear to auscultation bilaterally. Normal respiratory effort.  Heart:  Regular rate and rhythm without murmurs or gallops noted. Normal S1,S2.   Abdomen:  soft, non-tender, normal bowel sounds, no distention; no masses  Msk:  muscle bulk and strength are grossly normal.  no obvious joint swelling.  Extremities:  2+ left pedal edema and 2+ right pedal edema.   Neurologic:  alert & oriented X3, strength normal in all extremities, and gait normal.   Skin:  few small scars onher legs otherwise no rashes, surgical scar on lower abdomen wel healed Cervical Nodes:  no anterior cervical adenopathy and no posterior cervical adenopathy.   Axillary Nodes:  no R axillary adenopathy and no L axillary adenopathy.   Psych:  Oriented X3, memory intact for recent and remote, normally interactive, and good eye contact.     Impression & Recommendations:  Problem # 1:  FEVER UNSPECIFIED (ICD-780.60) Assessment New [name] differential is broad. Her hx of granulmota on CT chest raises possibility of a dimorphic fungus such as histo, blasto,cocci, crypto. She never was rx for LTB though I dont think she has pulmonary TB this is a clue to a potential infectious disease. Will recheck ct chest and check ct abd, pelvis, esr, crp serologies for fungi, hiv  ab, rna, hepatitis serologies, cryoglobulins, ck, ldh, ferritin, ana, rf, anca, asca (given her gi ssx), ACE level. IF unrevealing may consider imaging head and neck. PET scans are used in [place] to more quickly narrow further diagnostic tests. I have asked her to stopthe ibuprofen which could mask fevers and which can cause aseptic  meningitis and to keep a fever diary Orders: T-Comprehensive Metabolic Panel [phone]) T-CBC w/Diff [phone]) T-C-Reactive Protein [id number]) T-Antinuclear Antib (ANA) [phone]) T-CK Total 252 496 9594) T-CMV IgG Antibody ([id number]) T-CMV IgM  Antibody ([id number]) T-dsDNA Assay [phone]) T-ENA Panel ([id number]) T-Epstein-Barr virus, early antigen ([id number]) T-Epstein-Barr virus, nuclear antigen [phone]) T-Epstein-Barr virus, viral capsid ([id number]) T-Ferritin ([id number]) T-Hepatitis A Antibody (06269-48546) T-Hepatitis B Core Antibody ([id number]) T-Hepatitis B Surface Antibody (18299-37169) T-Hepatitis B Surface Antigen (67893-81017) T-Hepatitis C Antibody (51025-85277) T-HIV Antibody  (Reflex) ([id number]) T-HIV Viral Load ([id number]) T-Lactate Dehydrogenase (LDH) (61950-93267) T-Prot. Elect. (Serum) [phone]) T-Rheumatoid Factor 734 310 7017) T-Sed Rate (Automated) [phone]) T- * Misc. Laboratory test [phone]) T- * Misc. Laboratory test [phone]) T- * Misc. Laboratory test 707 164 8009) T- * Misc. Laboratory test [id number]) T- * Misc. Laboratory test [phone]) T-Pan-ANCA [phone]) T- * Misc. Laboratory test [phone]) CT with & without Contrast (CT w&w/o Contrast) T- * Misc. Laboratory test [phone]) Consultation Level V [phone])  Problem # 2:  TB SKIN TEST, POSITIVE (ICD-795.5)  she is goinog to need treatment at some pt for LTB, but we need to workup her fuo first. Rx is free at [facility] though wouldnt rx at this pt  Orders: Consultation Level V (99245)  Problem # 3:  ABDOMINAL PAIN, CHRONIC  (ICD-789.00)  this along with her GI ssx, prompting to look for markers for IBD with ASCA and ANCA  Orders: Consultation Level V (56314)  Problem # 4:  PYURIA (ICD-791.9)  she may have had some true UTIs but they dont explain her constellation of ssx very well. That being said recurrent utis could certainly be a cause for recurrent fevers wihtout systemic weight loss eetc one would expect with something like tb, malignancy. AGain she does not related ssx of dysuria though with most of her fevers.   Orders: Consultation Level V (97026)  Problem # 5:  DYSPNEA (ICD-786.05)  may be clue with grnd glass on ct  Orders: Consultation Level V (37858)  Problem # 6:  HYPERTENSION (ICD-401.9) bp up today, likely sec to anxiety Her updated medication list for this problem includes:    Micardis 40 Mg Tabs (Telmisartan) .[name]... Take 1 by mouth qd    Hydrochlorothiazide 25 Mg Tabs (Hydrochlorothiazide) .[name]... 1 tab daily  BP today: 149/81 Prior BP: 118/72 (01/20/2010)  Labs Reviewed: K+: 3.5 (10/20/2009) Creat: : 0.8 (10/20/2009)   Chol: 306 (10/20/2009)   HDL: 37.40 (10/20/2009)   LDL: DEL (09/27/2008)   TG: 293.0 (10/20/2009)  Problem # 7:  LEUKOPENIA, MILD (IFO-277.41) mild certainly cyclic neutropenia can cause recurrent fevers. May consider checking her cbc twice monthly Orders: Consultation Level V [phone])  Patient Instructions: 1)  we will check multiple blood tests and 2)  ct chest abdomen and pelvis 3)  keep fever diary off of your ibuprofen 4)  good luck with surgery 5)  Rtc to see Dr. Daiva Eves in one month Process Orders Check Orders Results:     [facility]: ABN not required for this insurance Tests Sent for requisitioning (February 06, 2010 12:21 PM):     02/06/2010: [facility] -- T-Comprehensive Metabolic Panel [[id number]] (signed)     02/06/2010: [facility] -- T-CBC w/Diff [[id number]] (signed)     02/06/2010: [facility] -- T-C-Reactive Protein [phone] (signed)     02/06/2010: [facility] -- T-Antinuclear Antib (ANA) [[id number]] (signed)     02/06/2010: [facility] -- T-CK Total [82550-23250] (signed)     02/06/2010: [facility] -- T-CMV  IgG Antibody [[id number]] (signed)     02/06/2010: [facility] -- T-CMV IgM  Antibody [[id number]] (signed)     02/06/2010: [facility] -- T-dsDNA Assay [[id number]] (signed)     02/06/2010: [facility] -- T-ENA Panel [[id number]] (signed)     02/06/2010: [facility] -- T-Epstein-Barr virus, early antigen [phone] (signed)     02/06/2010: [facility] -- T-Epstein-Barr virus, nuclear antigen [phone] (signed)     02/06/2010: [facility] -- T-Epstein-Barr virus, viral capsid [phone] (signed)     02/06/2010: [facility] -- T-Ferritin [[id number]] (signed)     02/06/2010: [facility] -- T-Hepatitis A Antibody [56387-56433] (signed)     02/06/2010: [facility] -- T-Hepatitis B Core Antibody [[id number]] (signed)     02/06/2010: [facility] -- T-Hepatitis B Surface Antibody [[id number]] (signed)     02/06/2010: [facility] -- T-Hepatitis B Surface Antigen [[id number]] (signed)     02/06/2010: [facility] -- T-Hepatitis C Antibody [[id number]] (signed)     02/06/2010: [facility] -- T-HIV Antibody  (Reflex) [[id number]] (signed)     02/06/2010: [facility] -- T-HIV Viral Load [phone] (signed)     02/06/2010: [facility] -- T-Lactate Dehydrogenase (LDH) [94854-62703] (signed)     02/06/2010: [facility] -- T-Prot. Elect. (Serum) [phone] (signed)     02/06/2010: [facility] -- T-Rheumatoid Factor  [phone] (signed)     02/06/2010: [facility] -- T-Sed Rate (Automated) [phone] (signed)     02/06/2010: [facility] -- T- * Misc. Laboratory test [phone] (signed)     02/06/2010: [facility] -- T- * Misc. Laboratory test [phone] (signed)     02/06/2010: [facility] -- T- * Misc. Laboratory test [phone] (signed)     02/06/2010: [facility] -- T- * Misc. Laboratory test [phone] (signed)     02/06/2010: [facility] -- T- * Misc. Laboratory test [99999] (signed)     02/06/2010: [facility] -- T-Pan-ANCA [3167] (signed)     02/06/2010: [facility] -- T- * Misc. Laboratory test [phone] (signed)     02/06/2010: [facility] -- T- * Misc. Laboratory test [phone] (signed)

## 2010-12-12 NOTE — Consult Note (Signed)
Summary: Diabetic Eye Exam/[facility]  Diabetic Eye Exam/[facility]   Imported By: Jerrye Noble D'jimraou 01/09/2008 14:17:18  _____________________________________________________________________  External Attachment:    Type:   Image     Comment:   External Document

## 2010-12-12 NOTE — Assessment & Plan Note (Signed)
Summary: 2 MO FU/$50/PN   Vital Signs:  Patient profile:   55 year old female Height:      72 inches Weight:      276 pounds BMI:     37.57 Temp:     97.0 degrees F Pulse rate:   88 / minute BP sitting:   134 / 80  (left arm) Cuff size:   large  Vitals Entered By: Charlynne Cousins CMA (February 18, 2009 11:01 AM) CC: follow-up visit   CC:  follow-up visit.  History of Present Illness: pt states she takes claritin otc, but still has congestion. pt says cbg's are still low-100's.  she takes lantus 100/day, and humalog (just before each meal) 25-25-50.  she had cbg of 49 in the middle of the night, on a day when she may have accidentally taken an extra dose of insulin  Current Medications (verified): 1)  Metformin Hcl 500 Mg  Tabs (Metformin Hcl) .... Take 2 By Mouth Bid 2)  Protonix 40 Mg  Tbec (Pantoprazole Sodium) .... Take 1 By Mouth Qd 3)  Micardis 40 Mg  Tabs (Telmisartan) .... Take 1 By Mouth Qd 4)  Ibuprofen 800 Mg  Tabs (Ibuprofen) .... Take 1 By Mouth Two Times A Day Once Daily Prn 5)  Adult Aspirin Low Strength 81 Mg  Tbdp (Aspirin) .... Take 1 By Mouth Qd 6)  Lantus 100 Unit/ml  Soln (Insulin Glargine) .... Take 90 Units Subcutaneously Qd 7)  Humalog Kwikpen 100 Unit/ml  Soln (Insulin Lispro (Human)) .... Three Times A Day (Qac) 15-15-40 8)  Lyrica 150 Mg  Caps (Pregabalin) .... Two Times A Day--Dr Blenda Mounts 9)  Insupen Ultrafin 29g X 30m Misc (Insulin Pen Needle) .... Use As Directed 10)  Triamcinolone Acetonide 0.025 % Crea (Triamcinolone Acetonide) .... Three Times A Day As Needed Rash 11)  Onetouch Ultra Test  Strp (Glucose Blood) .... Three Times A Day, and Lancets 250.01 12)  Tramadol Hcl 50 Mg Tabs (Tramadol Hcl) .... Take 1-2 Q 4-6 Hours Prn 13)  Methocarbamol 500 Mg Tabs (Methocarbamol) .... Take 1 Q 6 Hours Prn 14)  Metanx 2.8-25-2 Mg Tabs (L-Methylfolate-B6-B12) .... Take 1 Two Times A Day 15)  Docusate Sodium 250 Mg Caps (Docusate Sodium) .... Take 1 At Bedtime 16)   Actos 45 Mg Tabs (Pioglitazone Hcl) .... Qd 17)  Metoprolol-Hydrochlorothiazide 50-25 Mg Tabs (Metoprolol-Hydrochlorothiazide) ..[name]. 1 Daily  Allergies (verified): 1)  ! Codeine 2)  ! * Antihistamines 3)  ! Ace Inhibitors  Past History:  Past Medical History:    esophageal stricture    Diabetes mellitus, type II    Hyperlipidemia    Hypertension    asthma as child    DJD    Depression    Peripheral neuropathy    Allergic rhinitis     (11/21/2007)  Review of Systems       The patient complains of weight gain.  The patient denies fever.    Physical Exam  Head:  head: no deformity eyes: no periorbital swelling, no proptosis external nose and ears are normal mouth: no lesion seen  Ears:  right tm very red, left is slightly red Psych:  flat affect.     Impression & Recommendations:  Problem # 1:  DIABETES MELLITUS, TYPE II (ICD-250.00) apparently well-controlled  Problem # 2:  ALLERGIC RHINITIS (ICD-477.9)  Problem # 3:  aom  Medications Added to Medication List This Visit: 1)  Metoprolol-hydrochlorothiazide 50-25 Mg Tabs (Metoprolol-hydrochlorothiazide) ..[name]. 1 daily 2)  Cvs Loratadine 10 Mg Tabs (Loratadine) .[name].. 1 qd 3)  Flonase 50 Mcg/act Susp (Fluticasone propionate) .... 2 sprays each side qd 4)  Cefuroxime Axetil 250 Mg Tabs (Cefuroxime axetil) .... Bid  Other Orders: Est. Patient Level IV (37628)  Patient Instructions: 1)  nasonex 2 sprays each side once daily 2)  same loratadine 3)  cefuroxime 250 mg two times a day 4)  call if hypoglycemia recurs. 5)  redouble diet efforts 6)  i advised alli medication--otc 7)  i advised bariatric surgery Prescriptions: CEFUROXIME AXETIL 250 MG TABS (CEFUROXIME AXETIL) bid  #14 x 0   Entered and Authorized by:   Donavan Foil MD   Signed by:   Donavan Foil MD on 02/18/2009   Method used:   Electronically to        [facility]. [phone]* (retail)       [place], [place]  [address]       Ph: [phone]       Fax: [phone]   RxID:   [id number] FLONASE 50 MCG/ACT SUSP (FLUTICASONE PROPIONATE) 2 sprays each side qd  #30 days x 11   Entered and Authorized by:   Donavan Foil MD   Signed by:   Donavan Foil MD on 02/18/2009   Method used:   Electronically to        [facility]. [phone]* (retail)       [address]       [place], [place]  [address]       Ph: [phone]       Fax: [phone]   RxID:   [id number]

## 2010-12-12 NOTE — Progress Notes (Signed)
Summary: metformin  Phone Note Call from Patient Call back at Home Phone [phone]   Caller: [phone] Call For: dr ellsion Summary of Call: Per pt call need a refill on  per pt call need her medication filled for METFORMIN HCL 500 MG  TABS (METFORMIN HCL) take 2 by mouth two times a day once daily  .[name]..want to know why she cant get medication filled she has an appt on friday with dr ellsion  Initial call taken by: Nonah Mattes,  July 14, 2008 8:24 AM  Follow-up for Phone Call        [place] ON 07/12/08 WITH 5  REFILLS WILL RE-SEND Follow-up by: Tomma Lightning,  July 14, 2008 8:36 AM  Additional Follow-up for Phone Call Additional follow up Details #1::        July 14, 2008 8:42 AM called pt to inform pt made aware Additional Follow-up by: Nonah Mattes,  July 14, 2008 8:42 AM      Prescriptions: METFORMIN HCL 500 MG  TABS (METFORMIN HCL) take 2 by mouth bid  #120 x 5   Entered by:   Tomma Lightning   Authorized by:   Donavan Foil MD   Signed by:   Tomma Lightning on 07/14/2008   Method used:   Electronically to        [facility]. [phone]* (retail)       [address]       [place], [place]  [address]       Ph: [phone]       Fax: [phone]   RxID:   [id number]

## 2010-12-14 NOTE — Letter (Signed)
Summary: [facility]  [facility]   Imported By: Lennie Odor 11/20/2010 14:52:47  _____________________________________________________________________  External Attachment:    Type:   Image     Comment:   External Document

## 2010-12-22 ENCOUNTER — Ambulatory Visit (INDEPENDENT_AMBULATORY_CARE_PROVIDER_SITE_OTHER): Payer: Managed Care, Other (non HMO) | Admitting: Endocrinology

## 2010-12-22 ENCOUNTER — Encounter: Payer: Self-pay | Admitting: Endocrinology

## 2010-12-22 ENCOUNTER — Other Ambulatory Visit: Payer: Self-pay | Admitting: Endocrinology

## 2010-12-22 DIAGNOSIS — E119 Type 2 diabetes mellitus without complications: Secondary | ICD-10-CM

## 2010-12-22 DIAGNOSIS — E785 Hyperlipidemia, unspecified: Secondary | ICD-10-CM

## 2010-12-22 DIAGNOSIS — Z79899 Other long term (current) drug therapy: Secondary | ICD-10-CM

## 2010-12-22 DIAGNOSIS — E890 Postprocedural hypothyroidism: Secondary | ICD-10-CM

## 2010-12-22 LAB — HEMOGLOBIN A1C: Hgb A1c MFr Bld: 6.5 % (ref 4.6–6.5)

## 2010-12-22 LAB — HEPATIC FUNCTION PANEL
ALT: 23 U/L (ref 0–35)
AST: 21 U/L (ref 0–37)
Albumin: 3.7 g/dL (ref 3.5–5.2)
Alkaline Phosphatase: 102 U/L (ref 39–117)
Bilirubin, Direct: 0.1 mg/dL (ref 0.0–0.3)
Total Bilirubin: 0.4 mg/dL (ref 0.3–1.2)
Total Protein: 5.9 g/dL — ABNORMAL LOW (ref 6.0–8.3)

## 2010-12-22 LAB — LIPID PANEL
Cholesterol: 138 mg/dL (ref 0–200)
HDL: 35.5 mg/dL — ABNORMAL LOW (ref >39.00)
LDL Cholesterol: 84 mg/dL (ref 0–99)
Total CHOL/HDL Ratio: 4
Triglycerides: 93 mg/dL (ref 0.0–149.0)
VLDL: 18.6 mg/dL (ref 0.0–40.0)

## 2010-12-22 LAB — TSH: TSH: 2.11 u[IU]/mL (ref 0.35–5.50)

## 2010-12-27 ENCOUNTER — Telehealth (INDEPENDENT_AMBULATORY_CARE_PROVIDER_SITE_OTHER): Payer: Self-pay | Admitting: *Deleted

## 2010-12-28 NOTE — Assessment & Plan Note (Signed)
Summary: 3 MTH FU --STC   Vital Signs:  Patient profile:   55 year old female Height:      72 inches (182.88 cm) Weight:      196 pounds (89.09 kg) BMI:     26.68 O2 Sat:      97 % on Room air Temp:     97.9 degrees F (36.61 degrees C) oral Pulse rate:   74 / minute Pulse rhythm:   regular BP sitting:   104 / 66  (left arm) Cuff size:   large  Vitals Entered By: Brenton Grills CMA Duncan Dull) (December 22, 2010 8:18 AM)  O2 Flow:  Room air CC: 3 month F/U/pt is no longer using Triamcinolone cream, Flonase, or Pen Needles Is Patient Diabetic? Yes   Referring Provider:  Romero Belling Primary Provider:  Minus Breeding MD  CC:  3 month F/U/pt is no longer using Triamcinolone cream, Flonase, and or Pen Needles.  History of Present Illness: the status of at least 3 ongoing medical problems is addressed today: dm: pt says her cbg varies from 85-120. postsurgical hypothyroidism:  she feels tired. edema: has recurred. painful neuropathy: little or no help with the increase of lyrica.  it is worse at night.  she feels as though the lyrica does not help.  she took cymbalta in the past--no help.    Current Medications (verified): 1)  Protonix 40 Mg  Tbec (Pantoprazole Sodium) .... Take 1 By Mouth Qd 2)  Lyrica 150 Mg  Caps (Pregabalin) .[name].. 1 Tab Each Am, and 2 At Bedtime 3)  Triamcinolone Acetonide 0.025 % Crea (Triamcinolone Acetonide) .... Three Times A Day As Needed Rash 4)  Docusate Sodium 250 Mg Caps (Docusate Sodium) .... Take 2 Two Times A Day 5)  Cvs Loratadine 10 Mg Tabs (Loratadine) .[name].. 1 Qd 6)  Flonase 50 Mcg/act Susp (Fluticasone Propionate) .... 2 Sprays Each Side As Needed 7)  Bd Pen Needle Short U/f 31g X 8 Mm Misc (Insulin Pen Needle) .... Qid 8)  Kombiglyze Xr 2.03-999 Mg Xr24h-Tab (Saxagliptin-Metformin) .[name].. 1 Tab Each Am 9)  Actigall 300 Mg Caps (Ursodiol) .[name].. 1 Tablet Two Times A Day 10)  Tums .[name].. 2-3 Tablets Four Times A Day 11)  Multivitamin .[name].. 3 Daily 12)   Simvastatin 40 Mg Tabs (Simvastatin) .... 1/2 Tab Once Daily 13)  Onetouch Ultra Blue  Strp (Glucose Blood) .... Use As Directed 250.01 14)  Flax Seed Oil 1000 Mg Caps (Flaxseed (Linseed)) .[name].. 1 By Mouth Once Daily 15)  Fish Oil 1000 Mg Caps (Omega-3 Fatty Acids) .[name].. 1 By Mouth Once Daily 16)  Vitamin D3 400 Unit Tabs (Cholecalciferol) .[name].. 1 By Mouth Once Daily 17)  Vitamin E 600 Unit Caps (Vitamin E) .[name].. 1 By Mouth Once Daily 18)  Biotin 5 Mg Caps (Biotin) .[name].. 1 By Mouth Once Daily 19)  Selenium 100 Mcg Tabs (Selenium) .[name].. 1 By Mouth Once Daily 20)  Levothyroxine Sodium 125 Mcg Tabs (Levothyroxine Sodium) .[name].. 1 Tab Once Daily 21)  Nasonex 50 Mcg/act Susp (Mometasone Furoate) .... 2 Sprays in Each Nostril Two Times A Day As Needed 22)  Lysine 500 Mg Caps (Lysine) .[name].. 1 By Mouth Once Daily 23)  Ipratropium Bromide 0.06 % Soln (Ipratropium Bromide) .... 2 Puffs in Each Nostril Two Times A Day  Allergies (verified): 1)  ! * Antihistamines 2)  ! Ace Inhibitors 3)  ! Caffeine-Sodium Benzoate (Caffeine-Sodium Benzoate)  Past History:  Past Medical History: Last updated: 11/21/2007 esophageal  stricture Diabetes mellitus, type II Hyperlipidemia Hypertension asthma as child DJD Depression Peripheral neuropathy Allergic rhinitis  Review of Systems  The patient denies weight loss and weight gain.    Physical Exam  General:  normal appearance.   Pulses:  dorsalis pedis intact bilat.   Extremities:  no deformity.  no ulcer on the feet.  feet are of normal color and temp.    1+ right pedal edema and 1+ left pedal edema.   Neurologic:  voice is slightly hoarse sensation is intact to touch on the feet, but slightly decreased from normal Additional Exam:  FastTSH                   2.11 uIU/mL                 0.35-5.50 Hemoglobin A1C            6.5 %   LDL Cholesterol           84 mg/dL    Impression & Recommendations:  Problem # 1:  DIABETES MELLITUS, TYPE II  (ICD-250.00) well-controlled  Problem # 2:  POSTSURGICAL HYPOTHYROIDISM (ICD-244.0) well-replaced  Problem # 3:  HYPERLIPIDEMIA (ICD-272.4) well-controlled  Problem # 4:  PERIPHERAL NEUROPATHY (ICD-356.9) Assessment: Unchanged with painful component  Problem # 5:  EDEMA (ICD-782.3) Assessment: Unchanged  Medications Added to Medication List This Visit: 1)  Docusate Sodium 250 Mg Caps (Docusate sodium) .... Take 2 two times a day 2)  Nasonex 50 Mcg/act Susp (Mometasone furoate) .... 2 sprays in each nostril two times a day as needed 3)  Lysine 500 Mg Caps (Lysine) .[name].. 1 by mouth once daily 4)  Ipratropium Bromide 0.06 % Soln (Ipratropium bromide) .... 2 puffs in each nostril two times a day 5)  Fluticasone Propionate 50 Mcg/act Susp (Fluticasone propionate) .... 2 sprays each nostril once daily  Other Orders: TLB-TSH (Thyroid Stimulating Hormone) (84443-TSH) TLB-A1C / Hgb A1C (Glycohemoglobin) (83036-A1C) TLB-Lipid Panel (80061-LIPID) TLB-Hepatic/Liver Function Pnl (80076-HEPATIC) Est. Patient Level IV (09811)  Patient Instructions: 1)  blood tests are being ordered for you today.  please call [phone] to hear your test results.   2)  Please schedule a follow-up appointment in 3 months. 3)  you can stop lyrica on a trial basis.  try a cream such as capsacin or "mineral ice." 4)  (update: i left message on phone-tree:  rx as we discussed) Prescriptions: FLUTICASONE PROPIONATE 50 MCG/ACT SUSP (FLUTICASONE PROPIONATE) 2 sprays each nostril once daily  #1 bottle x 11   Entered and Authorized by:   Minus Breeding MD   Signed by:   Minus Breeding MD on 12/22/2010   Method used:   Electronically to        [facility]. [phone]* (retail)       [address]       [place], [place]  [address]       Ph: [phone]       Fax: [phone]   RxID:   [id number]    Orders Added: 1)  TLB-TSH (Thyroid Stimulating Hormone) [84443-TSH] 2)  TLB-A1C /  Hgb A1C (Glycohemoglobin) [83036-A1C] 3)  TLB-Lipid Panel [80061-LIPID] 4)  TLB-Hepatic/Liver Function Pnl [80076-HEPATIC] 5)  Est. Patient Level IV [56387]

## 2011-01-03 NOTE — Progress Notes (Signed)
  Phone Note Other Incoming   Request: Send information Summary of Call: Per patient's medical release request, 29 pages of her records were faxed to [phone] for Dr. Loman Chroman.

## 2011-01-12 ENCOUNTER — Encounter: Payer: Self-pay | Admitting: Endocrinology

## 2011-01-23 NOTE — Letter (Signed)
Summary: [name]  [facility]   Imported By: Sherian Rein 01/16/2011 12:50:40  _____________________________________________________________________  External Attachment:    Type:   Image     Comment:   External Document

## 2011-01-29 LAB — BUN: BUN: 10 mg/dL (ref 6–23)

## 2011-01-29 LAB — CREATININE, SERUM
Creatinine, Ser: 0.77 mg/dL (ref 0.4–1.2)
GFR calc Af Amer: >60 mL/min (ref >60)
GFR calc non Af Amer: >60 mL/min (ref >60)

## 2011-02-15 ENCOUNTER — Encounter: Payer: Self-pay | Admitting: *Deleted

## 2011-03-22 ENCOUNTER — Encounter: Payer: Self-pay | Admitting: Endocrinology

## 2011-03-22 DIAGNOSIS — R635 Abnormal weight gain: Secondary | ICD-10-CM

## 2011-03-23 ENCOUNTER — Ambulatory Visit (INDEPENDENT_AMBULATORY_CARE_PROVIDER_SITE_OTHER): Payer: Managed Care, Other (non HMO) | Admitting: Endocrinology

## 2011-03-23 ENCOUNTER — Other Ambulatory Visit (INDEPENDENT_AMBULATORY_CARE_PROVIDER_SITE_OTHER): Payer: Managed Care, Other (non HMO)

## 2011-03-23 ENCOUNTER — Encounter: Payer: Self-pay | Admitting: Endocrinology

## 2011-03-23 DIAGNOSIS — E119 Type 2 diabetes mellitus without complications: Secondary | ICD-10-CM

## 2011-03-23 DIAGNOSIS — E890 Postprocedural hypothyroidism: Secondary | ICD-10-CM

## 2011-03-23 LAB — HEMOGLOBIN A1C: Hgb A1c MFr Bld: 6.1 % (ref 4.6–6.5)

## 2011-03-23 LAB — TSH: TSH: 0.45 u[IU]/mL (ref 0.35–5.50)

## 2011-03-23 MED ORDER — CEFUROXIME AXETIL 250 MG PO TABS: 250.0000 mg | ORAL_TABLET | Freq: Two times a day (BID) | ORAL | Status: AC

## 2011-03-23 MED ORDER — KETOCONAZOLE 2 % EX CREA: TOPICAL_CREAM | Freq: Two times a day (BID) | CUTANEOUS | Status: DC

## 2011-03-23 NOTE — Patient Instructions (Addendum)
i have sent a prescription to your pharmacy for an antibiotic, and the skin cream for the yeast infection of the abdomen blood tests are being ordered for you today.  please call [phone] to hear your test results.  You will be prompted to enter the 9-digit "MRN" number that appears at the top left of this page, followed by #.  Then you will hear the message. pending the test results, please continue the same medications for now Please make a follow-up appointment in 3 months Treatment for the blood pressure and swelling for now, will be further weight loss.

## 2011-03-23 NOTE — Progress Notes (Signed)
Subjective:    Patient ID: Savannah [name], female    DOB: 22-Mar-1956, 55 y.o.   MRN: [id number]  HPI The state of at least three ongoing medical problems is addressed today: Edema:  Has recurred since last ov. Htn:  She has been off meds since surgery.  Dm:  no cbg record, but states cbg's are well-controlled. Hypothyroidism:  Since on the increased synthroid, she feels no different. She feels her ears are "full."  She has slight vertigo.  She has been rx'ed with septra for acute sinusitis.  She has slight nasal congstion. She reports "yeast infection" between the skin folds of the redundant skin of the abdomen. Past Medical History  Diagnosis Date  . THYROID NODULE 03/09/2010  . Postsurgical hypothyroidism 09/19/2010  . DIABETES MELLITUS, TYPE II 07/08/2007  . HYPERLIPIDEMIA 07/08/2007  . OBESITY 01/06/2008  . LEUKOPENIA, MILD 09/29/2008  . DEPRESSION 11/21/2007  . PERIPHERAL NEUROPATHY 11/21/2007  . HYPERTENSION 07/08/2007  . SINUSITIS- ACUTE-NOS 11/21/2007  . ALLERGIC RHINITIS 11/21/2007  . Eosinophilic esophagitis 02/13/2008  . Other chronic nonalcoholic liver disease 01/06/2008  . Acute cystitis 08/17/2009  . OSTEOARTHRITIS 01/06/2008  . FEVER UNSPECIFIED 10/20/2009  . Edema 05/20/2009  . Headache 02/06/2010  . DYSPNEA 05/20/2009  . ABDOMINAL PAIN, CHRONIC 10/20/2009  . Hepatomegaly 01/06/2008  . TB SKIN TEST, POSITIVE 02/06/2010  . FEVER, HX OF 03/09/2010  . Bariatric surgery status 07/07/2010  . ASYMPTOMATIC POSTMENOPAUSAL STATUS 09/29/2008  . Peripheral neuropathy     Past Surgical History  Procedure Date  . Abdominal hysterectomy   . Right total knee replacement   . Oophorectomy     History   Social History  . Marital Status: Single    Spouse Name: N/A    Number of Children: N/A  . Years of Education: N/A   Occupational History  . Not on file.   Social History Main Topics  . Smoking status: Former Games developer  . Smokeless tobacco: Not on file  . Alcohol Use: Yes  . Drug Use: No    . Sexually Active:    Other Topics Concern  . Not on file   Social History Narrative  . No narrative on file    Current Outpatient Prescriptions on File Prior to Visit  Medication Sig Dispense Refill  . Biotin 5 MG CAPS Take 1 capsule by mouth daily.        . Cholecalciferol (VITAMIN D3) 400 UNITS tablet Take 400 Units by mouth daily.        [name] docusate sodium (COLACE) 250 MG capsule Take 500 mg by mouth 2 (two) times daily.        . fish oil-omega-3 fatty acids 1000 MG capsule Take 1 g by mouth daily.        . fluticasone (FLONASE) 50 MCG/ACT nasal spray 2 sprays by Nasal route daily.        [name] ipratropium (ATROVENT) 0.06 % nasal spray 2 sprays by Nasal route 2 (two) times daily.        [name] loratadine (CLARITIN) 10 MG tablet Take 10 mg by mouth daily.        . Multiple Vitamin (MULTIVITAMIN) tablet Take 1 tablet by mouth 3 (three) times daily.        . pantoprazole (PROTONIX) 40 MG tablet Take 40 mg by mouth daily.        . Saxagliptin-Metformin (KOMBIGLYZE XR) 2.03-999 MG TB24 Take 1 tablet by mouth every morning.        . simvastatin (  ZOCOR) 40 MG tablet Take 20 mg by mouth daily.          Allergies  Allergen Reactions  . Ace Inhibitors     REACTION: Cough  . Caffeine-Sodium Benzoate     REACTION: PVCs    Family History  Problem Relation Age of Onset  . Diabetes Mother   . Diabetes Father   . Liver cancer Maternal Aunt     2 aunt    BP 142/82  Pulse 60  Temp(Src) 97.5 F (36.4 C) (Oral)  Ht 5\' 11"  (1.803 m)  Wt 195 lb 9.6 oz (88.724 kg)  BMI 27.28 kg/m2  SpO2 99%    Review of Systems Denies sob.  No signif weight change.    Objective:   Physical Exam GENERAL: no distress Ext:  Trace bilat leg edema Both eac's are normal.  Both tm's are slightly red.     Lab Results  Component Value Date   HGBA1C 6.1 03/23/2011   Lab Results  Component Value Date   TSH 0.45 03/23/2011    Assessment & Plan:  Dm, well-controlled Edema, recurrent bilat  aom Cutaneous mycosis, prob due to redundant skin. Hypothyroidism.  Well-replaced Htn, needs increased rx

## 2011-03-27 ENCOUNTER — Other Ambulatory Visit: Payer: Self-pay | Admitting: Endocrinology

## 2011-03-27 NOTE — Discharge Summary (Signed)
Savannah [name], Savannah [name]                  ACCOUNT NO.:  [id number]   MEDICAL RECORD NO.:  [id number]            PATIENT TYPE:   LOCATION:                                 FACILITY:   PHYSICIAN:  Doran Heater. Veverly Fells, M.D.      DATE OF BIRTH:   DATE OF ADMISSION:  10/18/2008  DATE OF DISCHARGE:  10/22/2008                               DISCHARGE SUMMARY   ADMISSION DIAGNOSIS:  End-stage osteoarthritis of the right knee.   DISCHARGE DIAGNOSIS:  End-stage osteoarthritis of the right knee.   BRIEF HISTORY:  The patient is a 55 year old female with worsening  osteoarthritis of the right knee.  The patient has elected to have a  total knee arthroplasty.   PROCEDURE:  The patient underwent right total knee arthroplasty by Dr.  Esmond Plants on October 18, 2008.  Assistant was [employer].  Estimated blood loss was minimal.  No complications.   HOSPITAL COURSE:  The patient admitted on October 18, 2008, for the  above-stated procedure, which she tolerated well.  After adequate time  in post anesthesia care unit, she was transferred up to 5000.  Postop  day #1, the patient complained of moderate pain in the right knee, was  able to work with the CPM, but could not work physical therapy very  well.  Postop day #2, she complained about some continued pain, was  fairly slow to go in physical therapy and mobilization.  The patient  does not have any social support at home.  Thus skilled nursing facility  versus rehab was recommended by therapy and also by the patient, which  we were in agreement with this plan.  Over the next 2 days,  the patient  was able to continue to work with physical therapy, albeit  slow.  The  patient will be discharged to  either rehab or skilled nursing facility  on October 22, 2008.  Her condition is stable.   FOLLOWUP:  The patient will follow back up with Dr. Esmond Plants in 2  weeks.  Her diet is low sodium.  The patient has allergies to caffeine  and Benadryl.   DISCHARGE MEDICATIONS:  1. Coumadin per pharmacy protocol.  2. Hydrochlorothiazide 25 mg p.o. daily.  3. Lovenox 30 mg subcu b.i.d. until INR is between 2.5 and 3.0.  4. Insulin NovoLog 1-11 units subcu t.i.d. with food.  5. Claritin 10 mg p.o. daily.  6. Glucophage 1000 mg p.o. b.i.d.  7. Multivitamin.  8. Benicar 20 mg p.o. daily.  9. Protonix 40 mg p.o. daily.  10.Actos 45 mg p.o. daily.  11.Lyrica 150 mg p.o. b.i.d.  12.Percocet 10/325 one to two tablets q.4-6 h. p.r.n. pain.  13.Robaxin 500 mg p.o. q.6 h.  14.Ultram 50 mg p.o. q.4-6 h. p.r.n.[name]      Thomas B. Doren Custard, P.A.      Doran Heater. Veverly Fells, M.D.  Electronically Signed    TBD/MEDQ  D:  10/21/2008  T:  10/22/2008  Job:  [id number]

## 2011-03-27 NOTE — H&P (Signed)
NAME:  Savannah [name], Savannah [name]                  ACCOUNT NO.:  [id number]   MEDICAL RECORD NO.:  [id number]          PATIENT TYPE:  OUT   LOCATION:  [facility]                          FACILITY:  [facility]   PHYSICIAN:  Doran Heater. Veverly Fells, M.D. DATE OF BIRTH:  [birth year]   DATE OF ADMISSION:  08/13/2008  DATE OF DISCHARGE:  08/13/2008                              HISTORY & PHYSICAL   CHIEF COMPLAINT:  My right knee hurts.   HISTORY OF PRESENT ILLNESS:  The patient is a 55 year old female who  presents with a history of worsening right knee pain secondary to  radiographically-documented knee arthritis.  The patient has bone-on-  bone arthritis in the medial compartment of her right knee.  She has had  a prior left knee arthroscopy, which has given her some relief in her  left knee.  The patient has progressive functional loss as well as rest  pain and night pain in the right knee and has primary osteoarthritis.  She has failed multiple conservative treatment options including  injection therapy, activity modifications, presents now for total knee  arthroplasty.   PAST MEDICAL HISTORY:  Diabetes requiring insulin.  She also has  hypertension, fibromyalgia, as well as anemia.  She has had history of  uterine cancer.   PAST SURGICAL HISTORY:  Hysterectomy and bilateral ovary removal.  The  other surgical history is for her knee arthroscopy in her left knee,  this past 2009.   FAMILY MEDICAL HISTORY:  Positive for diabetes.   CURRENT MEDICATIONS:  1. Lantus 200 units q.a.m.  2. Humalog 50 to 60 units before dinner.  3. Metformin 500 mg p.o. b.i.d.  4. Protonix 40 mg p.o. daily.  5. Clarinex 1 p.o. daily.  6. Meraux 1 p.o. b.i.d.  7. Micardis 40 mg p.o. q.a.m.  8. Tramadol 50 mg p.o. q.6 h. p.r.n.  9. Hydrochlorothiazide 20 mg p.o. daily.  10.Aspirin 81 mg p.o. q.a.m.  11.Multivitamin 1 p.o. daily.  12.Lyrica 150 mg p.o. b.i.d.   DRUG ALLERGIES:  None.   SOCIAL HISTORY:  The patient is a former  smoker, having quit 15 years  ago, and she drinks very rarely.  She is single and lives in a single  level townhome with one small step to get into the townhouse.   PHYSICAL EXAMINATION:  VITAL SIGNS:  Temperature was not recorded, pulse  78, respiratory rate 16, blood pressure 140/90.  GENERAL:  Healthy appearing female who is in minimal to no distress.  EXTREMITIES:  She moves fairly well about the examination room but with  an obvious antalgic gait favoring the right knee.  She is tender to  palpation over the joint line of her knee both medially and laterally.  She has pain with varus stress, no pain with valgus stress.  Stable knee  as mentioned.  She has no popliteal masses.  Her hip and ankle range of  motion are normal.  Distal pulses are intact.  She does have some  obvious diffuse swelling in both legs consistent with peripheral edema.   Her radiograph demonstrated end-stage arthritis of right knee  with bone-  on-bone.   IMPRESSION:  Right knee end-stage osteoarthritis.   PROCEDURE PERFORMED:  Right total knee replacement by Dr. Veverly Fells.  Surgery date is October 18, 2008.      Doran Heater. Veverly Fells, M.D.  Electronically Signed     SRN/MEDQ  D:  09/28/2008  T:  09/29/2008  Job:  [id number]

## 2011-03-27 NOTE — H&P (Signed)
NAME:  Savannah [name], Savannah [name]                  ACCOUNT NO.:  [id number]   MEDICAL RECORD NO.:  [id number]          PATIENT TYPE:  AMB   LOCATION:  [place]                           FACILITY:  [facility]   PHYSICIAN:  Thornell Sartorius, MD        DATE OF BIRTH:  Apr 09, 1956   DATE OF ADMISSION:  DATE OF DISCHARGE:                              HISTORY & PHYSICAL   DATE OF PROCEDURE:  Apr 09, 2008.   REASON FOR ADMISSION:  She is being admitted for an excision of vulvar  mass--likely sebaceous cyst.   HISTORY OF PRESENT ILLNESS:  A 55 year old Caucasian P0 who presents for  an annual exam as well as a complaint of a lump in her labia.  It is on  the left-hand side and it is approximately 1 x 3 cm.  It is soft and  likely lymph node versus sebaceous cyst.  The patient requests removal,  she states its been there for multiple months and is uncomfortable.   PAST MEDICAL HISTORY:  1. Significant for uterine cancer which was found in the early stage.      She has previously been seen by an oncologist for lymph node      dissection and bilateral salpingo-oophorectomy.  2. She also has diabetes.  3. Hypertension.  4. She has a history of depression.  5. Questionable diagnosis of fibromyalgia.   PAST SURGICAL HISTORY:  1. Significant for tonsillectomy and adenoidectomy.  2. Fracture reduction in 1966 and 1967.  3. Hysterectomy in 1997 as well as BSO and lymph node dissection in      1997 shortly after her hysterectomy secondary to endometrial      cancer.   ALLERGIES:  CAFFEINE and ANTIHISTAMINES which cause PVCs.   MEDICATIONS:  1. Lantus.  2. Humalog.  3. Metformin.  4. Protonix.  5. Lyrica.  6. Lasix.  7. Diazide.   SOCIAL HISTORY:  The patient states she has rare alcohol use.  Denies  tobacco use.  She quit 5 years ago.  No other drug use.  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   PAST OBSTETRICAL/GYNECOLOGICAL HISTORY:  She has never had an abnormal  Pap smear.  Her last  was approximately 2 years ago and was performed in  the office at presentation.  No history of any sexually transmitted  diseases.  Menarche was age 61.  Her cycles are described as irregular  and heavy.  She states her hysterectomy was for menometrorrhagia.  She  has never been pregnant.  She had a recent mammogram.   FAMILY HISTORY:  Significant for diabetes in mother, father, maternal  grandmother, paternal grandmother as well as aunts and uncles.  Congestive heart failure in her father.  Hypertension in her father.  Myelodysplastic syndrome in her father as well.  Breast cancer in a  maternal aunt.  Colon cancer in an uncle.  No history of ovarian cancer  in the family.   PHYSICAL EXAMINATION:  VITAL SIGNS:  She is 6 feet tall, weighs 260  pounds,  her pulse is 80, blood pressure 126/64.  GENERAL:  She is in no apparent distress.  HEENT:  Mucous membranes are moist.  NECK:  No lymphadenopathy.  Thyroid is within normal limits.  HEART:  Regular rate and rhythm.  LUNGS:  Clear to auscultation bilaterally.  ABDOMEN:  Soft, nontender and nondistended with good bowel sounds.  BACK:  Without costovertebral angle tenderness.  EXTREMITIES:  Symmetric and nontender.  PELVIC:  Revealed normal external female genitalia.  Normal Bartholin's,  urethral and Skene's glands.  Good support and a well-healed cuff.  Bimanual exam is difficult secondary to her habitus.  RECTAL/VAGINAL:  Exam confirms and is hemoccult positive secondary to  hemorrhoids.   DISCUSSION:  The lump in her labia was further discussed.  On discussion  with the patient, it was decided to proceed with excision in the  operating room which will take place on Apr 09, 2008.  We discussed  risks, options and alternatives and she wishes to proceed.      Thornell Sartorius, MD  Electronically Signed     JB/MEDQ  D:  04/08/2008  T:  04/08/2008  Job:  [id number]

## 2011-03-27 NOTE — Op Note (Signed)
NAME:  [name], Savannah                  ACCOUNT NO.:  [id number]   MEDICAL RECORD NO.:  [id number]          PATIENT TYPE:  AMB   LOCATION:  [place]                           FACILITY:  [facility]   PHYSICIAN:  Thornell Sartorius, MD        DATE OF BIRTH:  May 04, 1956   DATE OF PROCEDURE:  04/09/2008  DATE OF DISCHARGE:                               OPERATIVE REPORT   PREOPERATIVE DIAGNOSIS:  Vulvar mass.   POSTOPERATIVE DIAGNOSIS:  Vulvar mass, likely lipoma.   PROCEDURE:  Excision of vulvar/labial mass.   SURGEON:  Thornell Sartorius, MD   ANESTHESIA:  MAC with 10 mL of 0.25% lidocaine as well as 5 mL of 0.5%  Marcaine.   FINDINGS:  1 cm x 3 cm labial mass, likely lipoma.   SPECIMENS:  To pathology.   COMPLICATIONS:  None.   ESTIMATED BLOOD LOSS:  Minimal.   IV FLUIDS:  600 mL.   URINE OUTPUT:  The patient voided directly before OR.   PROCEDURE IN DETAIL:  After informed consent was reviewed with the  patient including risks, benefits, and alternatives of the surgical  procedure, she was transported to the operating room, placed on the  table in supine position, and sedation was initiated and her feet were  placed in [place].  Her bottom was prepped and draped in the  normal fashion.  Using a 15 blade knife, an approximately 2-3 cm  incision was made in the superior aspect of the mass which had been  outlined with a marking pen.  The skin was incised and the incision was  extended through the subcuticular tissue.  The mass was palpated during  this and expressed through the incision.  There was minimal bleeding  from this site.  Several deep sutures of 3-0 Vicryl were placed and the  skin was closed in a subcuticular fashion with 3-0 vicryl.  The mass  will be sent to pathology.  The patient tolerated the procedure well.  Sponge, lap, and needle counts were correct x2 at the end of the  procedure.      Thornell Sartorius, MD  Electronically Signed    JB/MEDQ  D:  04/09/2008  T:   04/10/2008  Job:  [id number]

## 2011-03-27 NOTE — Consult Note (Signed)
[facility]                          ENDOCRINOLOGY CONSULTATION   NAME:Savannah [name], Savannah [name]                           MRN:          [id number]  DATE:07/04/2007                            DOB:          1956/01/10    Self referred.   REASON FOR REFERRAL:  Diabetes.   HISTORY OF PRESENT ILLNESS:  A 55 year old woman who reports an 8-year  history of diabetes.  She has been on insulin for several years and  currently takes Lantus 90 units a day.  She states her glucose is not  well controlled and seldom checks her glucose at night.  Symptomatically, she has 8 years of foot pain and numbness.   Regarding her hypertension, she takes her Micardis and HCTZ and prefers  to take a generic.   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  This was  last dilated about 1-2 years ago.  And, she states Protonix helps but  she wants to take the brand name product on this.   Several months of intermittent right upper quadrant pain not related to  eating.   PAST MEDICAL HISTORY:  Otherwise, healthy.   MEDICATIONS:  Lantus, Motrin, Micardis, HCTZ, Protonix, and Metformin.   SOCIAL HISTORY:  She is single.  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   FAMILY HISTORY:  Positive for diabetes in both parents.   REVIEW OF SYSTEMS:  She has chronic bilateral knee pain.  She denies  nausea and vomiting.   PHYSICAL EXAMINATION:  VITAL SIGNS:  Blood pressure 125/81, heart rate  is 82, temperature is 98, the weight is 248.  GENERAL:  Obese.  SKIN:  No rash.  No diaphoretic.  HEENT:  No proptosis.  No periorbital swelling.  Pharynx normal.  NECK:  No goiter.  CHEST:  Clear to auscultation.  No respiratory distress.  CARDIOVASCULAR:  There is 1 plus bilateral pretibial edema.  Regular  rate and rhythm.  No murmur.  Pedal pulses are intact and there is no  bruit at the  carotid arteries.  EXTREMITIES:  Feet, normal color and temperature.  There is no ulcer  present on the feet.  NEUROLOGIC:  Alert and oriented, does not appear anxious nor depressed.  Sensation is slightly decreased to touch in the feet.   IMPRESSION:  1. Insulin-requiring type 2 diabetes, probably poorly controlled.  2. Therapy limited by lack of home glucoses.  3. Well controlled hypertension.  4. Right upper quadrant pain.  5. History of esophageal stricture.   PLAN:  1. Check glucose at home.  2. Increase insulin p.r.n. elevated glucose.  3. Check laboratory studies.  4. I have told the patient there is no generic equivalent to Micardis.      I offered to change to an ACE inhibitor but she states she had a      cough when she took this.  5. Return in a few weeks.     Sean A. Loanne Drilling, MD  Electronically Signed  SAE/MedQ  DD: 07/06/2007  DT: 07/06/2007  Job #: [id number]

## 2011-03-27 NOTE — Op Note (Signed)
NAME:  Savannah [name], Savannah [name]                  ACCOUNT NO.:  [id number]   MEDICAL RECORD NO.:  [id number]          PATIENT TYPE:  INP   LOCATION:  70                         FACILITY:  [facility]   PHYSICIAN:  Doran Heater. Veverly Fells, M.D. DATE OF BIRTH:  26-Jan-1956   DATE OF PROCEDURE:  DATE OF DISCHARGE:                               OPERATIVE REPORT   PREOPERATIVE DIAGNOSIS:  End-stage arthritis, right knee.   POSTOPERATIVE DIAGNOSIS:  End-stage arthritis, right knee.   PROCEDURE PERFORMED:  Right total knee replacement using DePuy Sigma  rotating platform prosthesis.   ATTENDING SURGEON:  Doran Heater. Veverly Fells, MD   ASSISTANT:  Abbott Pao. Dixon, PA   ANESTHESIA:  General anesthesia was used plus thermal block.   ESTIMATED BLOOD LOSS:  Minimal.   FLUID REPLACEMENT:  1800 mL crystalloid.   URINE OUTPUT:  300 mL.   INDICATIONS:  The patient is a 55 year old female with a history of  worsening right knee pain secondary to arthritis.  The patient has  failed conservative management to this point, presents for operative  treatment to restore function and eliminate pain.  Informed consent was  obtained.   DESCRIPTION OF PROCEDURE:  After an adequate level of anesthesia was  achieved, the patient was positioned in the supine on the operating  table.  Right leg was correctly identified and nonsterile tourniquet was  placed proximally on the thigh.  After sterilely prepped and draped the  right knee, we elevated the knee and exsanguinated the knee using an  Esmarch bandage and elevated the tourniquet to 350 mmHg.  Longitudinal  midline incision was created with knee flexion.  Medial parapatellar  arthrotomy was created.  The patella was everted.  Distal femur was  opened using a step-cut drill.  We then placed the distal femoral  resection guide, which was intramedullary, set on 11 mm resection and 5  degrees right.  After performing a distal cut removing marginal  osteophyte, performed a sizing of the  femur which was the size 4 with  posterior, anterior down referencing.  We then placed a 4-in-1 cutting  block removing anteroposterior bone as well as the chamfer cuts.  The  placement of the block was done with reference to the epicondylar axis  as well as the posterior condyles.  Once we had resected the bone on the  femur, we removed ACL, PCL, meniscal tissue, subluxed tibia anteriorly,  and performed a 90-degree cut on the tibia.  We went ahead at this point  and incised the tibia to a size 4.  We did the marginal keel punching  drill.  Once we done that, we went ahead and placed the box cut guide on  the femur to remove the notch bone for placement of the posterior  cruciate-substituting prosthesis.  We then placed a trial prosthesis,  released it first to size 10, then to 12.5 polyethylene insert, noting  excellent balance and soft tissue tension with the 12.5 insert.  We then  resurfaced the patella, this had deep wear, and was only 22 mm thick.  We resected down to a  15-mm thickness with the patellar cutting guide.  At this point, we went ahead and placed our 38 patellar button in place,  took the knee through full range of motion, excellent patellar stability  with no hand technique was identified.  We removed all trial components,  used available bone to plug the end of the distal femur and then  cemented the components in place with DePuy high-viscosity cement.  Once  the cement was allowed to harden with the knee in extension using a 12.5  insert, we took the knee to a full range of motion.  Excellent stability  and balance was noted with normal patellar tracking using the 12.5  insert.  We selected the real 12.5 insert, removed the trial component,  removed all access cement using a quarter inch curved osteotome.  It  should be mentioned that we did prior to placing the components, removed  the excess posterior bone off the posterior condyle using a 3/4-inch  curved osteotome  and a laminal spur.  A final inspection was made to  make sure that there are no loose pieces of bone or cement that was  performed.  We thoroughly irrigated the knee and then closed the knee  using a #1 Vicryl for the medial parapatellar arthrotomy with figure-of-  eight sutures interrupted followed by 2-0 Vicryl subcutaneous closure  and 4-0 running Monocryl for skin.  Sterile compressive bandage was  applied followed by a knee immobilizer.  The patient tolerated the  surgery well.      Doran Heater. Veverly Fells, M.D.  Electronically Signed     SRN/MEDQ  D:  10/18/2008  T:  10/19/2008  Job:  [id number]

## 2011-03-27 NOTE — Assessment & Plan Note (Signed)
[place] OFFICE NOTE   NAME:[name], Savannah                           MRN:          [id number]  DATE:09/12/2007                            DOB:          09/07/1956    REFERRING PHYSICIAN:  Sean A. Loanne Drilling, MD   Savannah [name] is a 55 year old white female who recently moved to [place]  from [place] where she has lived for 10 years.  She is referred  through the courtesy of Dr. Loanne Drilling for evaluation of dysphagia.   HISTORY OF PRESENT ILLNESS:  Going back to childhood, Savannah [name] has had  intermittent solid food dysphagia.  She has had multiple dilatations.  I  have rather extensive records from [place] that show that she has had  multiple balloon dilatations for strictures in her esophagus in the  proximal, middle, and distal esophagus.  It is of note that one of the  biopsies obtained in January of 2003 that showed mild eosinophilic  infiltrate.  With her last dilation she had a tear and had chest pain  and a postoperative barium swallow that did not show any abnormalities  and did not show hiatal hernia.  She has empirically been maintained on  chronic Protonix 40 mg a day.  She does have a lot of atopic allergy  type of complaints and takes daily Claritin.  She is not on allergy  shots.   She continues to complain of intermittent solid food dysphagia but is  not having any other symptoms such as chest pain, regurgitation, she  denies hepatobiliary complaints.  Her appetite is good and her weight is  stable.  Her only other gastrointestinal problem is one of chronic  recurrent constipation with intermittent rectal bleeding.  She did have  a colonoscopy several years ago in [place] which was unremarkable and  she has known internal hemorrhoids.  She denies significant abdominal  gas, bloating, melena or significant hematochezia.  The patient did  break some ribs back in May of this year and has been on  some narcotic  therapy that has worsened her constipation.  She denies any specific  food intolerances.  She has had no anorexia or weight loss.   PAST MEDICAL HISTORY:  Remarkable for multiple medical problems  including:  1. Hypertensive cardiovascular disease.  2. History of asthma as a child.  3. Hypercholesterolemia.  4. Degenerative arthritis.  5. Chronic depression.  6. Previous hysterectomy for uterine carcinoma.  7. She has also had oophorectomy.  8. Suffers from adult onset diabetes.  9. Peripheral neuropathy.  10.Essential hypertension.   MEDICATIONS:  1. Protonix 40 mg a day.  2. HCTZ 25 mg a day.  3. Micardis 40 mg a day.  4. Ibuprofen 800 mg twice a day.  5. Aspirin 81 mg a day.  6. Humalog 23 units at night and Lantus insulin 150 units in the      morning.  7. Tramadol 50 mg as needed.  8. Multivitamins with Iron daily.  9. Uses p.r.n. Claritin.   She gives a history of problems in the past with  ACE inhibitors, and  Cafergot.   FAMILY HISTORY:  1. She allegedly has 2 aunts that had liver cancer.  2. She also has 2 aunts with breast cancer.  3. Diabetes runs in several family members including both parents.   SOCIAL HISTORY:  1. She is single and lives alone.  2. 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  3. She does not smoke.  4. Uses ethanol socially.   REVIEW OF SYSTEMS:  She has multiple complaints including diffuse  arthralgias and myalgias, nonspecific edema of her lower extremities,  chronic insomnia, excessive thirst, night sweats, chronic back pain,  excessive urination, severe fatigue, and various myalgias.  She denies  any specific cardiovascular, pulmonary, or other neurological  complaints.  She is followed by Dr. Loanne Drilling in our office and recently  had blood work which was normal except for a white count of 3100 and an  elevated blood glucose of 264.  Liver function tests have been abnormal  with alkaline  phosphatase of 124, SGOT 67, and SGPT 112 with an albumin  of 3.6 grams percent.  Hemoglobin A1c on July 04, 2007, was 11.8 and  thyroid function tests were normal.  Patient had ultrasound of the upper  abdomen on July 11, 2007, which showed hepatomegaly, rather marked  fatty infiltration of her liver.   EXAMINATION:  She is a healthy appearing white female in no distress,  appearing her stated age.  She is 6 feet tall, weight is 247 pounds.  Blood pressure 120/80, pulse was 88 and regular.  I could not appreciate  stigmata of chronic liver disease or definite thyromegaly.  CHEST:  Clear without wheezing or rhonchi and she was in a regular  rhythm without murmurs, gallops or rubs.  ABDOMEN:  Did show enlarged liver in the right upper quadrant measuring  approximately 13 cm in size.  The edge was slightly firm and slightly  tender.  I could not appreciate splenomegaly, other abdominal masses or  tenderness.  Bowel sounds were normal.  PERIPHERAL EXTREMITIES:  Shows no edema, phlebitis, or swollen joints.   ASSESSMENT:  1. Probable eosinophilic esophagitis with multiple esophageal      strictures.  As for my above note, she has had increased      eosinophils on previous biopsies but I do not think these are      quantitated.  2. Adult onset diabetes with severe obesity and associated metabolic      syndrome with hepatomegaly and abnormal liver function test and      NASH syndrome  3. Chronic osteoarthritis.  She does take aspirin and nonsteroidal      anti-inflammatories.  4. Status post total abdominal hysterectomy and bilateral oophorectomy      for uterine cancer.  5. History of chronic depression.  6  History of essential hypertension.  1. History of hemorrhoidal rectal bleeding with previous negative      colonoscopy.  She does have associated constipation at this time      related to narcotic use.   RECOMMENDATIONS:  1. The patient relates that a high fiber diet makes her  constipation      worse, so we will try Amitiza 24 mcg twice a day with meals.      Patient says she does not want to use MiraLax because it gives her      diarrhea.  2. Continue reflux regimen with empiric Protonix.  I have scheduled  her to see Dr. Abran Richard, at [facility], for examination,      endoscopy, and dilations.  If she does indeed have eosinophilic      esophagitis this would explain most of her problems with multiple      congenital strictures.  As I mentioned above, the patient does      have atopic features and takes chronic Claritin.  3. Continue all other medications as per Dr. Loanne Drilling.  4. We have recommended to Dr. Loanne Drilling that she have other laboratory      parameters to exclude a metabolic cause of her liver disease.  I      have recommended Hepatitis B Antigen, Hepatitis C antibody, ANA,      AMA, alpha-1 antitrypsin level, ceruloplasmin, iron studies, and      prothrombin time.  She may benefit from p.o. Actos since this has      recently, in Gastroenterology in October of this year, shown this      has an beneficial effect on hepatic inflammation, fibrosis with      NASH syndrome.     Loralee Pacas. Sharlett Iles, MD, [facility], [place]  Electronically Signed    DRP/MedQ  DD: 09/12/2007  DT: 09/12/2007  Job #: [id number]   cc:   Hilliard Clark A. Loanne Drilling, MD  Abran Richard, Dr.

## 2011-04-05 ENCOUNTER — Other Ambulatory Visit: Payer: Self-pay | Admitting: Endocrinology

## 2011-04-05 NOTE — Telephone Encounter (Signed)
OK to refill? Med removed from med list 02/0/2012 (see in EMR)

## 2011-04-05 NOTE — Telephone Encounter (Signed)
Rx faxed to pharmacy  

## 2011-04-13 LAB — HM PAP SMEAR

## 2011-05-14 ENCOUNTER — Other Ambulatory Visit: Payer: Self-pay | Admitting: Endocrinology

## 2011-05-27 ENCOUNTER — Other Ambulatory Visit: Payer: Self-pay | Admitting: Endocrinology

## 2011-06-22 ENCOUNTER — Encounter: Payer: Self-pay | Admitting: Endocrinology

## 2011-06-22 ENCOUNTER — Ambulatory Visit (INDEPENDENT_AMBULATORY_CARE_PROVIDER_SITE_OTHER): Payer: Managed Care, Other (non HMO) | Admitting: Endocrinology

## 2011-06-22 VITALS — BP 102/68 | HR 73 | Temp 98.1°F | Ht 71.0 in | Wt 193.8 lb

## 2011-06-22 DIAGNOSIS — E119 Type 2 diabetes mellitus without complications: Secondary | ICD-10-CM

## 2011-06-22 DIAGNOSIS — Z9884 Bariatric surgery status: Secondary | ICD-10-CM

## 2011-06-22 NOTE — Progress Notes (Signed)
Subjective:    Patient ID: Savannah [name], female    DOB: Aug 27, 1956, 55 y.o.   MRN: [id number]  HPI The state of at least three ongoing medical problems is addressed today: Pt is 16 mos s/p gastric bypass surgery.  Weight-loss has leveled off.   She reduced lyrica, with no change in leg pain.  She saw a neurologist, who rx'ed elavil.  She has not decided if she should take or not.   no cbg record, but states cbg's have increased to the mid-100's in am.   She does not take her crestor. Past Medical History  Diagnosis Date  . THYROID NODULE 03/09/2010  . Postsurgical hypothyroidism 09/19/2010  . DIABETES MELLITUS, TYPE II 07/08/2007  . HYPERLIPIDEMIA 07/08/2007  . OBESITY 01/06/2008  . LEUKOPENIA, MILD 09/29/2008  . DEPRESSION 11/21/2007  . PERIPHERAL NEUROPATHY 11/21/2007  . HYPERTENSION 07/08/2007  . SINUSITIS- ACUTE-NOS 11/21/2007  . ALLERGIC RHINITIS 11/21/2007  . Eosinophilic esophagitis 02/13/2008  . Other chronic nonalcoholic liver disease 01/06/2008  . Acute cystitis 08/17/2009  . OSTEOARTHRITIS 01/06/2008  . FEVER UNSPECIFIED 10/20/2009  . Edema 05/20/2009  . Headache 02/06/2010  . DYSPNEA 05/20/2009  . ABDOMINAL PAIN, CHRONIC 10/20/2009  . Hepatomegaly 01/06/2008  . TB SKIN TEST, POSITIVE 02/06/2010  . FEVER, HX OF 03/09/2010  . Bariatric surgery status 07/07/2010  . ASYMPTOMATIC POSTMENOPAUSAL STATUS 09/29/2008  . Peripheral neuropathy     Past Surgical History  Procedure Date  . Abdominal hysterectomy   . Right total knee replacement   . Oophorectomy     History   Social History  . Marital Status: Single    Spouse Name: N/A    Number of Children: N/A  . Years of Education: N/A   Occupational History  . Not on file.   Social History Main Topics  . Smoking status: Former Games developer  . Smokeless tobacco: Not on file  . Alcohol Use: Yes  . Drug Use: No  . Sexually Active:    Other Topics Concern  . Not on file   Social History Narrative  . No narrative on file    Current  Outpatient Prescriptions on File Prior to Visit  Medication Sig Dispense Refill  . Biotin 5 MG CAPS Take 1 capsule by mouth daily.        [name] CALCIUM CITRATE PO Take 1 tablet by mouth 3 (three) times daily. 600mg        . Cholecalciferol (VITAMIN D3) 400 UNITS tablet Take 400 Units by mouth daily.        [name] docusate sodium (COLACE) 250 MG capsule Take 500 mg by mouth 2 (two) times daily.        . fluticasone (FLONASE) 50 MCG/ACT nasal spray Place 2 sprays into the nose as needed.       [name] ipratropium (ATROVENT) 0.06 % nasal spray Place 2 sprays into the nose 2 (two) times daily as needed.       [name] levothyroxine (SYNTHROID, LEVOTHROID) 150 MCG tablet Take 150 mcg by mouth daily.        [name] loratadine (CLARITIN) 10 MG tablet Take 10 mg by mouth daily.        . pantoprazole (PROTONIX) 40 MG tablet TAKE 1 TABLET BY MOUTH ONCE DAILY  30 tablet  5  . Saxagliptin-Metformin (KOMBIGLYZE XR) 2.03-999 MG TB24 1 tablet every morning  30 tablet  5  . simvastatin (ZOCOR) 40 MG tablet Take 1/2 tablet by mouth once daily  30 tablet  3  Allergies  Allergen Reactions  . Ace Inhibitors     REACTION: Cough  . Caffeine-Sodium Benzoate     REACTION: PVCs    Family History  Problem Relation Age of Onset  . Diabetes Mother   . Diabetes Father   . Liver cancer Maternal Aunt     2 aunt    BP 102/68  Pulse 73  Temp(Src) 98.1 F (36.7 C) (Oral)  Ht 5\' 11"  (1.803 m)  Wt 193 lb 12.8 oz (87.907 kg)  BMI 27.03 kg/m2  SpO2 98%   Review of Systems Denies n/v.  She has intermittent diarrhea.    Objective:   Physical Exam GENERAL: no distress Pulses: dorsalis pedis intact bilat.   Feet: no deformity.  no ulcer on the feet.  feet are of normal color and temp.  Trace left leg edema Neuro: sensation is intact to touch on the feet   Lab Results  Component Value Date   WBC 2.9* 02/07/2010   HGB 11.3* 02/07/2010   HCT 36.0 02/07/2010   PLT 153 02/07/2010   CHOL 138 12/22/2010   TRIG 93.0 12/22/2010   HDL  35.50* 12/22/2010   LDLDIRECT 214.3 10/20/2009   ALT 23 12/22/2010   AST 21 12/22/2010   NA 143 09/19/2010   K 4.2 09/19/2010   CL 105 09/19/2010   CREATININE 0.7 09/19/2010   BUN 13 09/19/2010   CO2 32 09/19/2010   TSH 0.45 03/23/2011   HGBA1C 6.1 03/23/2011   MICROALBUR 0.6 01/20/2010      Assessment & Plan:  bariatric surg status, with good weight loss, but it has leveled off. Dm, well-controlled Dyslipidemia, needs increased rx Htn.  well-controlled off meds. Neuropathic pain, needs increased rx Leukopenia, chronic. No change.

## 2011-06-22 NOTE — Patient Instructions (Addendum)
blood tests are being ordered for you today.  please call [phone] to hear your test results.  You will be prompted to enter the 9-digit "MRN" number that appears at the top left of this page, followed by #.  Then you will hear the message. Based on the results, i may advise increasing the kombiglyze to 2x a day. Please make a regular physical appointment in 3 months. Some specialists believe taking at lot of folic acid (5000 mg/day) helps neuropathy.   You don't need blood pressure meds for now. (update: i left message on phone-tree:  You should resume crestor).

## 2011-06-29 ENCOUNTER — Other Ambulatory Visit (INDEPENDENT_AMBULATORY_CARE_PROVIDER_SITE_OTHER): Payer: Managed Care, Other (non HMO)

## 2011-06-29 DIAGNOSIS — Z9884 Bariatric surgery status: Secondary | ICD-10-CM

## 2011-06-29 DIAGNOSIS — E119 Type 2 diabetes mellitus without complications: Secondary | ICD-10-CM

## 2011-06-29 LAB — HEMOGLOBIN A1C: Hgb A1c MFr Bld: 7.5 % — ABNORMAL HIGH (ref 4.6–6.5)

## 2011-07-02 LAB — PTH, INTACT AND CALCIUM
Calcium, Total (PTH): 9.2 mg/dL (ref 8.4–10.5)
PTH: 18.3 pg/mL (ref 14.0–72.0)

## 2011-08-08 LAB — BASIC METABOLIC PANEL
BUN: 10
CO2: 26
Calcium: 10
Chloride: 102
Creatinine, Ser: 0.82
GFR calc Af Amer: >60
GFR calc non Af Amer: >60
Glucose, Bld: 277 — ABNORMAL HIGH
Potassium: 4.4
Sodium: 136

## 2011-08-08 LAB — CBC
HCT: 38.2
Hemoglobin: 13.4
MCHC: 35.1
MCV: 88.8
Platelets: 172
RBC: 4.3
RDW: 14.6
WBC: 2.7 — ABNORMAL LOW

## 2011-08-09 LAB — DIFFERENTIAL
Basophils Absolute: 0
Basophils Relative: 1
Eosinophils Absolute: 0
Eosinophils Relative: 0
Lymphocytes Relative: 13
Lymphs Abs: 0.8
Monocytes Absolute: 0.7
Monocytes Relative: 10
Neutro Abs: 5
Neutrophils Relative %: 76

## 2011-08-09 LAB — CBC
HCT: 34.8 — ABNORMAL LOW
Hemoglobin: 12.1
MCHC: 34.8
MCV: 89.7
Platelets: 115 — ABNORMAL LOW
RBC: 3.88
RDW: 13.5
WBC: 6.5

## 2011-08-09 LAB — URINALYSIS, ROUTINE W REFLEX MICROSCOPIC
Bilirubin Urine: NEGATIVE
Glucose, UA: 250 — AB
Hgb urine dipstick: NEGATIVE
Ketones, ur: NEGATIVE
Nitrite: NEGATIVE
Protein, ur: NEGATIVE
Specific Gravity, Urine: 1.017
Urobilinogen, UA: 1
pH: 6

## 2011-08-09 LAB — ROCKY MTN SPOTTED FVR AB, IGM-BLOOD: RMSF IgM: 0.08 IV

## 2011-08-09 LAB — URINE CULTURE: Colony Count: >=100000

## 2011-08-09 LAB — BASIC METABOLIC PANEL
BUN: 26 — ABNORMAL HIGH
CO2: 25
Calcium: 9.5
Chloride: 99
Creatinine, Ser: 1.1
GFR calc Af Amer: >60
GFR calc non Af Amer: 52 — ABNORMAL LOW
Glucose, Bld: 292 — ABNORMAL HIGH
Potassium: 4.4
Sodium: 134 — ABNORMAL LOW

## 2011-08-09 LAB — URINE MICROSCOPIC-ADD ON

## 2011-08-09 LAB — ROCKY MTN SPOTTED FVR AB, IGG-BLOOD: RMSF IgG: 0.15 IV

## 2011-08-17 LAB — BASIC METABOLIC PANEL
BUN: 11 mg/dL (ref 6–23)
BUN: 13 mg/dL (ref 6–23)
BUN: 15 mg/dL (ref 6–23)
BUN: 16 mg/dL (ref 6–23)
CO2: 26 mEq/L (ref 19–32)
CO2: 26 mEq/L (ref 19–32)
CO2: 27 mEq/L (ref 19–32)
CO2: 31 mEq/L (ref 19–32)
Calcium: 8.6 mg/dL (ref 8.4–10.5)
Calcium: 8.8 mg/dL (ref 8.4–10.5)
Calcium: 9.1 mg/dL (ref 8.4–10.5)
Calcium: 9.6 mg/dL (ref 8.4–10.5)
Chloride: 100 mEq/L (ref 96–112)
Chloride: 101 mEq/L (ref 96–112)
Chloride: 102 mEq/L (ref 96–112)
Chloride: 104 mEq/L (ref 96–112)
Creatinine, Ser: 0.72 mg/dL (ref 0.4–1.2)
Creatinine, Ser: 0.78 mg/dL (ref 0.4–1.2)
Creatinine, Ser: 0.82 mg/dL (ref 0.4–1.2)
Creatinine, Ser: 0.95 mg/dL (ref 0.4–1.2)
GFR calc Af Amer: >60 mL/min (ref >60)
GFR calc Af Amer: >60 mL/min (ref >60)
GFR calc Af Amer: >60 mL/min (ref >60)
GFR calc Af Amer: >60 mL/min (ref >60)
GFR calc non Af Amer: >60 mL/min (ref >60)
GFR calc non Af Amer: >60 mL/min (ref >60)
GFR calc non Af Amer: >60 mL/min (ref >60)
GFR calc non Af Amer: >60 mL/min (ref >60)
Glucose, Bld: 146 mg/dL — ABNORMAL HIGH (ref 70–99)
Glucose, Bld: 181 mg/dL — ABNORMAL HIGH (ref 70–99)
Glucose, Bld: 203 mg/dL — ABNORMAL HIGH (ref 70–99)
Glucose, Bld: 228 mg/dL — ABNORMAL HIGH (ref 70–99)
Potassium: 4 mEq/L (ref 3.5–5.1)
Potassium: 4.3 mEq/L (ref 3.5–5.1)
Potassium: 4.3 mEq/L (ref 3.5–5.1)
Potassium: 4.4 mEq/L (ref 3.5–5.1)
Sodium: 133 mEq/L — ABNORMAL LOW (ref 135–145)
Sodium: 136 mEq/L (ref 135–145)
Sodium: 136 mEq/L (ref 135–145)
Sodium: 138 mEq/L (ref 135–145)

## 2011-08-17 LAB — PROTIME-INR
INR: 1 (ref 0.00–1.49)
INR: 1 (ref 0.00–1.49)
INR: 1.3 (ref 0.00–1.49)
INR: 1.6 — ABNORMAL HIGH (ref 0.00–1.49)
INR: 1.6 — ABNORMAL HIGH (ref 0.00–1.49)
Prothrombin Time: 12.9 seconds (ref 11.6–15.2)
Prothrombin Time: 13.4 seconds (ref 11.6–15.2)
Prothrombin Time: 16.5 seconds — ABNORMAL HIGH (ref 11.6–15.2)
Prothrombin Time: 19.5 seconds — ABNORMAL HIGH (ref 11.6–15.2)
Prothrombin Time: 19.8 seconds — ABNORMAL HIGH (ref 11.6–15.2)

## 2011-08-17 LAB — URINALYSIS, ROUTINE W REFLEX MICROSCOPIC
Bilirubin Urine: NEGATIVE
Glucose, UA: NEGATIVE mg/dL
Hgb urine dipstick: NEGATIVE
Ketones, ur: NEGATIVE mg/dL
Nitrite: NEGATIVE
Protein, ur: NEGATIVE mg/dL
Specific Gravity, Urine: 1.016 (ref 1.005–1.030)
Urobilinogen, UA: 0.2 mg/dL (ref 0.0–1.0)
pH: 5.5 (ref 5.0–8.0)

## 2011-08-17 LAB — GLUCOSE, CAPILLARY
Glucose-Capillary: 128 mg/dL — ABNORMAL HIGH (ref 70–99)
Glucose-Capillary: 149 mg/dL — ABNORMAL HIGH (ref 70–99)
Glucose-Capillary: 150 mg/dL — ABNORMAL HIGH (ref 70–99)
Glucose-Capillary: 163 mg/dL — ABNORMAL HIGH (ref 70–99)
Glucose-Capillary: 165 mg/dL — ABNORMAL HIGH (ref 70–99)
Glucose-Capillary: 178 mg/dL — ABNORMAL HIGH (ref 70–99)
Glucose-Capillary: 188 mg/dL — ABNORMAL HIGH (ref 70–99)
Glucose-Capillary: 205 mg/dL — ABNORMAL HIGH (ref 70–99)
Glucose-Capillary: 212 mg/dL — ABNORMAL HIGH (ref 70–99)
Glucose-Capillary: 214 mg/dL — ABNORMAL HIGH (ref 70–99)
Glucose-Capillary: 216 mg/dL — ABNORMAL HIGH (ref 70–99)
Glucose-Capillary: 221 mg/dL — ABNORMAL HIGH (ref 70–99)
Glucose-Capillary: 226 mg/dL — ABNORMAL HIGH (ref 70–99)
Glucose-Capillary: 227 mg/dL — ABNORMAL HIGH (ref 70–99)
Glucose-Capillary: 238 mg/dL — ABNORMAL HIGH (ref 70–99)
Glucose-Capillary: 238 mg/dL — ABNORMAL HIGH (ref 70–99)
Glucose-Capillary: 246 mg/dL — ABNORMAL HIGH (ref 70–99)
Glucose-Capillary: 252 mg/dL — ABNORMAL HIGH (ref 70–99)

## 2011-08-17 LAB — DIFFERENTIAL
Basophils Absolute: 0 10*3/uL (ref 0.0–0.1)
Basophils Relative: 0 % (ref 0–1)
Eosinophils Absolute: 0.1 10*3/uL (ref 0.0–0.7)
Eosinophils Relative: 4 % (ref 0–5)
Lymphocytes Relative: 28 % (ref 12–46)
Lymphs Abs: 1 10*3/uL (ref 0.7–4.0)
Monocytes Absolute: 0.3 10*3/uL (ref 0.1–1.0)
Monocytes Relative: 8 % (ref 3–12)
Neutro Abs: 2.1 10*3/uL (ref 1.7–7.7)
Neutrophils Relative %: 60 % (ref 43–77)

## 2011-08-17 LAB — CBC
HCT: 26.9 % — ABNORMAL LOW (ref 36.0–46.0)
HCT: 30 % — ABNORMAL LOW (ref 36.0–46.0)
HCT: 32.2 % — ABNORMAL LOW (ref 36.0–46.0)
HCT: 36.7 % (ref 36.0–46.0)
Hemoglobin: 10.3 g/dL — ABNORMAL LOW (ref 12.0–15.0)
Hemoglobin: 11.1 g/dL — ABNORMAL LOW (ref 12.0–15.0)
Hemoglobin: 12.4 g/dL (ref 12.0–15.0)
Hemoglobin: 9.1 g/dL — ABNORMAL LOW (ref 12.0–15.0)
MCHC: 33.8 g/dL (ref 30.0–36.0)
MCHC: 33.9 g/dL (ref 30.0–36.0)
MCHC: 34.3 g/dL (ref 30.0–36.0)
MCHC: 34.3 g/dL (ref 30.0–36.0)
MCV: 89.9 fL (ref 78.0–100.0)
MCV: 91 fL (ref 78.0–100.0)
MCV: 91.3 fL (ref 78.0–100.0)
MCV: 91.5 fL (ref 78.0–100.0)
Platelets: 135 10*3/uL — ABNORMAL LOW (ref 150–400)
Platelets: 141 10*3/uL — ABNORMAL LOW (ref 150–400)
Platelets: 155 10*3/uL (ref 150–400)
Platelets: 161 10*3/uL (ref 150–400)
RBC: 2.96 MIL/uL — ABNORMAL LOW (ref 3.87–5.11)
RBC: 3.29 MIL/uL — ABNORMAL LOW (ref 3.87–5.11)
RBC: 3.52 MIL/uL — ABNORMAL LOW (ref 3.87–5.11)
RBC: 4.08 MIL/uL (ref 3.87–5.11)
RDW: 14.6 % (ref 11.5–15.5)
RDW: 14.8 % (ref 11.5–15.5)
RDW: 14.9 % (ref 11.5–15.5)
RDW: 15 % (ref 11.5–15.5)
WBC: 3.5 10*3/uL — ABNORMAL LOW (ref 4.0–10.5)
WBC: 4.9 10*3/uL (ref 4.0–10.5)
WBC: 5.3 10*3/uL (ref 4.0–10.5)
WBC: 7.8 10*3/uL (ref 4.0–10.5)

## 2011-08-17 LAB — TYPE AND SCREEN
ABO/RH(D): A POS
Antibody Screen: NEGATIVE

## 2011-08-17 LAB — ABO/RH: ABO/RH(D): A POS

## 2011-08-17 LAB — APTT: aPTT: 34 seconds (ref 24–37)

## 2011-09-21 ENCOUNTER — Ambulatory Visit: Payer: Managed Care, Other (non HMO) | Admitting: Endocrinology

## 2011-09-25 ENCOUNTER — Other Ambulatory Visit: Payer: Self-pay | Admitting: Endocrinology

## 2011-10-12 ENCOUNTER — Ambulatory Visit (INDEPENDENT_AMBULATORY_CARE_PROVIDER_SITE_OTHER): Payer: Managed Care, Other (non HMO) | Admitting: Endocrinology

## 2011-10-12 ENCOUNTER — Other Ambulatory Visit (INDEPENDENT_AMBULATORY_CARE_PROVIDER_SITE_OTHER): Payer: Managed Care, Other (non HMO)

## 2011-10-12 ENCOUNTER — Other Ambulatory Visit: Payer: Self-pay | Admitting: Endocrinology

## 2011-10-12 ENCOUNTER — Encounter: Payer: Self-pay | Admitting: Endocrinology

## 2011-10-12 VITALS — BP 128/60 | HR 84 | Temp 98.1°F | Ht 71.0 in | Wt 204.0 lb

## 2011-10-12 DIAGNOSIS — E890 Postprocedural hypothyroidism: Secondary | ICD-10-CM

## 2011-10-12 DIAGNOSIS — N3000 Acute cystitis without hematuria: Secondary | ICD-10-CM

## 2011-10-12 DIAGNOSIS — E785 Hyperlipidemia, unspecified: Secondary | ICD-10-CM

## 2011-10-12 DIAGNOSIS — E119 Type 2 diabetes mellitus without complications: Secondary | ICD-10-CM

## 2011-10-12 LAB — URINALYSIS, ROUTINE W REFLEX MICROSCOPIC
Bilirubin Urine: NEGATIVE
Hgb urine dipstick: NEGATIVE
Ketones, ur: NEGATIVE
Leukocytes, UA: NEGATIVE
Nitrite: NEGATIVE
Specific Gravity, Urine: <=1.005 (ref 1.000–1.030)
Total Protein, Urine: NEGATIVE
Urine Glucose: 500
Urobilinogen, UA: 0.2 (ref 0.0–1.0)
pH: 6 (ref 5.0–8.0)

## 2011-10-12 LAB — TSH: TSH: 0.64 u[IU]/mL (ref 0.35–5.50)

## 2011-10-12 LAB — LIPID PANEL
Cholesterol: 158 mg/dL (ref 0–200)
HDL: 36.2 mg/dL — ABNORMAL LOW (ref >39.00)
Total CHOL/HDL Ratio: 4
Triglycerides: 218 mg/dL — ABNORMAL HIGH (ref 0.0–149.0)
VLDL: 43.6 mg/dL — ABNORMAL HIGH (ref 0.0–40.0)

## 2011-10-12 LAB — HEMOGLOBIN A1C: Hgb A1c MFr Bld: 9.6 % — ABNORMAL HIGH (ref 4.6–6.5)

## 2011-10-12 LAB — LDL CHOLESTEROL, DIRECT: Direct LDL: 99 mg/dL

## 2011-10-12 MED ORDER — GLIMEPIRIDE 4 MG PO TABS: 4.0000 mg | ORAL_TABLET | Freq: Every day | ORAL | Status: DC

## 2011-10-12 MED ORDER — SAXAGLIPTIN-METFORMIN ER 2.5-1000 MG PO TB24: 2.0000 | ORAL_TABLET | ORAL | Status: DC

## 2011-10-12 NOTE — Patient Instructions (Addendum)
blood and urine tests are being ordered for you today.  please call [phone] to hear your test results.  You will be prompted to enter the 9-digit "MRN" number that appears at the top left of this page, followed by #.  Then you will hear the message. Based on the results, i may advise change the simvastatin back to crestor.  Here is a discount card.  Please make a regular physical appointment in 3 months.  (update: i left message on phone-tree:  Add amaryl 4 mg bid)

## 2011-10-12 NOTE — Progress Notes (Signed)
Subjective:    Patient ID: Savannah [name], female    DOB: 1956-09-25, 55 y.o.   MRN: [id number]  HPI Pt states a few weeks of pain at the urethra, in the context of urination, or even just sitting.  She has doubled the kombliglyze. Past Medical History  Diagnosis Date  . THYROID NODULE 03/09/2010  . Postsurgical hypothyroidism 09/19/2010  . DIABETES MELLITUS, TYPE II 07/08/2007  . HYPERLIPIDEMIA 07/08/2007  . OBESITY 01/06/2008  . LEUKOPENIA, MILD 09/29/2008  . DEPRESSION 11/21/2007  . PERIPHERAL NEUROPATHY 11/21/2007  . HYPERTENSION 07/08/2007  . SINUSITIS- ACUTE-NOS 11/21/2007  . ALLERGIC RHINITIS 11/21/2007  . Eosinophilic esophagitis 02/13/2008  . Other chronic nonalcoholic liver disease 01/06/2008  . Acute cystitis 08/17/2009  . OSTEOARTHRITIS 01/06/2008  . FEVER UNSPECIFIED 10/20/2009  . Edema 05/20/2009  . Headache 02/06/2010  . DYSPNEA 05/20/2009  . ABDOMINAL PAIN, CHRONIC 10/20/2009  . Hepatomegaly 01/06/2008  . TB SKIN TEST, POSITIVE 02/06/2010  . FEVER, HX OF 03/09/2010  . Bariatric surgery status 07/07/2010  . ASYMPTOMATIC POSTMENOPAUSAL STATUS 09/29/2008  . Peripheral neuropathy     Past Surgical History  Procedure Date  . Abdominal hysterectomy   . Right total knee replacement   . Oophorectomy     History   Social History  . Marital Status: Single    Spouse Name: N/A    Number of Children: N/A  . Years of Education: N/A   Occupational History  . Not on file.   Social History Main Topics  . Smoking status: Former Games developer  . Smokeless tobacco: Not on file  . Alcohol Use: Yes  . Drug Use: No  . Sexually Active:    Other Topics Concern  . Not on file   Social History Narrative  . No narrative on file    Current Outpatient Prescriptions on File Prior to Visit  Medication Sig Dispense Refill  . Cholecalciferol (VITAMIN D3) 400 UNITS tablet Take 400 Units by mouth daily.        [name] docusate sodium (COLACE) 250 MG capsule Take 500 mg by mouth 2 (two) times daily.        .  Flaxseed, Linseed, (FLAX SEED OIL PO) Take 1 capsule by mouth daily.        [name] levothyroxine (SYNTHROID, LEVOTHROID) 150 MCG tablet Take 150 mcg by mouth daily.        [name] loratadine (CLARITIN) 10 MG tablet Take 10 mg by mouth daily.        [name] LYRICA 150 MG capsule TAKE 1 CAPSULE BY MOUTH EVERY MORNING AND 2 CAPSULES AT BEDTIME  90 capsule  5  . pantoprazole (PROTONIX) 40 MG tablet TAKE 1 TABLET BY MOUTH ONCE DAILY  30 tablet  5  . Saxagliptin-Metformin 2.03-999 MG TB24 Take 2 tablets by mouth every morning.  60 tablet  11  . simvastatin (ZOCOR) 40 MG tablet Take 1/2 tablet by mouth once daily  30 tablet  3    Allergies  Allergen Reactions  . Ace Inhibitors     REACTION: Cough  . Caffeine-Sodium Benzoate     REACTION: PVCs    Family History  Problem Relation Age of Onset  . Diabetes Mother   . Diabetes Father   . Liver cancer Maternal Aunt     2 aunt    BP 128/60  Pulse 84  Temp(Src) 98.1 F (36.7 C) (Oral)  Ht 5\' 11"  (1.803 m)  Wt 204 lb (92.534 kg)  BMI 28.45 kg/m2  SpO2 98%  Review of Systems She has regained a few lbs.  Denies fever    Objective:   Physical Exam VITAL SIGNS:  See vs page. GENERAL: no distress. Pulses: dorsalis pedis intact bilat.   Feet: no deformity.  no ulcer on the feet.  feet are of normal color and temp.  no edema.  There are several heavy calluses.  Neuro: sensation is hypersensitive to touch on the feet.   Lab Results  Component Value Date   HGBA1C 9.6* 10/12/2011   Lab Results  Component Value Date   CHOL 158 10/12/2011   HDL 36.20* 10/12/2011   LDLCALC 84 12/22/2010   LDLDIRECT 99.0 10/12/2011   TRIG 218.0* 10/12/2011   CHOLHDL 4 10/12/2011   (i reviewed ua results)    Assessment & Plan:  Urinary sxs, uncertain etiology DM, needs increased rx Dyslipidemia, needs increased rx.  However, redoubled dietary efforts and continuation of zocor will help

## 2011-10-19 ENCOUNTER — Telehealth: Payer: Self-pay | Admitting: *Deleted

## 2011-10-19 MED ORDER — LEVOTHYROXINE SODIUM 150 MCG PO TABS: 150.0000 ug | ORAL_TABLET | Freq: Every day | ORAL | Status: DC

## 2011-10-19 NOTE — Telephone Encounter (Signed)
Based on the result, it is a borderline call between continuing the zocor, and taking crestor.  i'll go with your choice

## 2011-10-19 NOTE — Telephone Encounter (Signed)
Left message for pt to callback office.  

## 2011-10-19 NOTE — Telephone Encounter (Signed)
Pt is requesting results of last lab test and wants to know if she needs a refill of Crestor or not based on lab results. Pt is also requesting refill of Levothyroxine (rx sent to Pharmacy.)

## 2011-10-22 NOTE — Telephone Encounter (Signed)
Left message for pt to callback office.  

## 2011-10-23 NOTE — Telephone Encounter (Signed)
12% is fine.

## 2011-10-23 NOTE — Telephone Encounter (Signed)
Pt states that she will continue with the Zocor, she wants to know if she should increase her dosage to a whole tablet instead of a 1/2 tablet.

## 2011-10-24 NOTE — Telephone Encounter (Signed)
Pt informed of MD's advisement. 

## 2011-11-07 ENCOUNTER — Other Ambulatory Visit: Payer: Self-pay | Admitting: Endocrinology

## 2011-11-14 ENCOUNTER — Other Ambulatory Visit: Payer: Self-pay | Admitting: Endocrinology

## 2011-11-28 ENCOUNTER — Other Ambulatory Visit: Payer: Self-pay | Admitting: *Deleted

## 2011-11-28 MED ORDER — PREGABALIN 150 MG PO CAPS: ORAL_CAPSULE | ORAL | Status: DC

## 2011-11-28 NOTE — Telephone Encounter (Signed)
R'cd fax from [facility] for a new rx for Lyrica. Pt transferred medication to another pharmacy during a business trip, and it cannot be transferred back to [facility] since it is a controlled substance. Please advise.

## 2011-11-28 NOTE — Telephone Encounter (Signed)
i printed 

## 2011-11-29 NOTE — Telephone Encounter (Signed)
Rx faxed to [facility]

## 2012-01-04 ENCOUNTER — Other Ambulatory Visit (INDEPENDENT_AMBULATORY_CARE_PROVIDER_SITE_OTHER): Payer: Managed Care, Other (non HMO)

## 2012-01-04 ENCOUNTER — Encounter: Payer: Self-pay | Admitting: Endocrinology

## 2012-01-04 ENCOUNTER — Ambulatory Visit (INDEPENDENT_AMBULATORY_CARE_PROVIDER_SITE_OTHER): Payer: Managed Care, Other (non HMO) | Admitting: Endocrinology

## 2012-01-04 VITALS — BP 124/72 | HR 71 | Temp 97.8°F | Ht 71.0 in | Wt 194.0 lb

## 2012-01-04 DIAGNOSIS — Z79899 Other long term (current) drug therapy: Secondary | ICD-10-CM | POA: Insufficient documentation

## 2012-01-04 DIAGNOSIS — Z9884 Bariatric surgery status: Secondary | ICD-10-CM

## 2012-01-04 DIAGNOSIS — E890 Postprocedural hypothyroidism: Secondary | ICD-10-CM

## 2012-01-04 DIAGNOSIS — E119 Type 2 diabetes mellitus without complications: Secondary | ICD-10-CM

## 2012-01-04 DIAGNOSIS — C55 Malignant neoplasm of uterus, part unspecified: Secondary | ICD-10-CM

## 2012-01-04 LAB — CBC WITH DIFFERENTIAL/PLATELET
Basophils Absolute: 0 10*3/uL (ref 0.0–0.1)
Basophils Relative: 0.5 % (ref 0.0–3.0)
Eosinophils Absolute: 0.1 10*3/uL (ref 0.0–0.7)
Eosinophils Relative: 2.3 % (ref 0.0–5.0)
HCT: 36.9 % (ref 36.0–46.0)
Hemoglobin: 12.5 g/dL (ref 12.0–15.0)
Lymphocytes Relative: 33.2 % (ref 12.0–46.0)
Lymphs Abs: 1.2 10*3/uL (ref 0.7–4.0)
MCHC: 33.8 g/dL (ref 30.0–36.0)
MCV: 91.3 fl (ref 78.0–100.0)
Monocytes Absolute: 0.3 10*3/uL (ref 0.1–1.0)
Monocytes Relative: 7.2 % (ref 3.0–12.0)
Neutro Abs: 2.1 10*3/uL (ref 1.4–7.7)
Neutrophils Relative %: 56.8 % (ref 43.0–77.0)
Platelets: 111 10*3/uL — ABNORMAL LOW (ref 150.0–400.0)
RBC: 4.04 Mil/uL (ref 3.87–5.11)
RDW: 14.4 % (ref 11.5–14.6)
WBC: 3.7 10*3/uL — ABNORMAL LOW (ref 4.5–10.5)

## 2012-01-04 LAB — BASIC METABOLIC PANEL
BUN: 15 mg/dL (ref 6–23)
CO2: 26 mEq/L (ref 19–32)
Calcium: 9 mg/dL (ref 8.4–10.5)
Chloride: 109 mEq/L (ref 96–112)
Creatinine, Ser: 0.8 mg/dL (ref 0.4–1.2)
GFR: 83.18 mL/min (ref >60.00)
Glucose, Bld: 158 mg/dL — ABNORMAL HIGH (ref 70–99)
Potassium: 3.9 mEq/L (ref 3.5–5.1)
Sodium: 140 mEq/L (ref 135–145)

## 2012-01-04 LAB — URINALYSIS, ROUTINE W REFLEX MICROSCOPIC
Bilirubin Urine: NEGATIVE
Hgb urine dipstick: NEGATIVE
Ketones, ur: NEGATIVE
Leukocytes, UA: NEGATIVE
Nitrite: NEGATIVE
Specific Gravity, Urine: 1.025 (ref 1.000–1.030)
Total Protein, Urine: NEGATIVE
Urine Glucose: NEGATIVE
Urobilinogen, UA: 0.2 (ref 0.0–1.0)
pH: 5.5 (ref 5.0–8.0)

## 2012-01-04 LAB — HEPATIC FUNCTION PANEL
ALT: 32 U/L (ref 0–35)
AST: 21 U/L (ref 0–37)
Albumin: 3.9 g/dL (ref 3.5–5.2)
Alkaline Phosphatase: 104 U/L (ref 39–117)
Bilirubin, Direct: 0.1 mg/dL (ref 0.0–0.3)
Total Bilirubin: 0.4 mg/dL (ref 0.3–1.2)
Total Protein: 6.3 g/dL (ref 6.0–8.3)

## 2012-01-04 LAB — TSH: TSH: 0.06 u[IU]/mL — ABNORMAL LOW (ref 0.35–5.50)

## 2012-01-04 LAB — MICROALBUMIN / CREATININE URINE RATIO
Creatinine,U: 135.3 mg/dL
Microalb Creat Ratio: 1.7 mg/g (ref 0.0–30.0)
Microalb, Ur: 2.3 mg/dL — ABNORMAL HIGH (ref 0.0–1.9)

## 2012-01-04 LAB — HEMOGLOBIN A1C: Hgb A1c MFr Bld: 7.4 % — ABNORMAL HIGH (ref 4.6–6.5)

## 2012-01-04 MED ORDER — LEVOTHYROXINE SODIUM 100 MCG PO TABS: 100.0000 ug | ORAL_TABLET | Freq: Every day | ORAL | Status: DC

## 2012-01-04 NOTE — Patient Instructions (Addendum)
blood tests are being requested for you today.  please call [phone] to hear your test results.  You will be prompted to enter the 9-digit "MRN" number that appears at the top left of this page, followed by #.  Then you will hear the message. please consider these measures for your health:  minimize alcohol.  do not use tobacco products.  have a colonoscopy at least every 10 years from age 58.  Women should have an annual mammogram from age 36.  keep firearms safely stored.  always use seat belts.  have working smoke alarms in your home.  see an eye doctor and dentist regularly.  never drive under the influence of alcohol or drugs (including prescription drugs).  those with fair skin should take precautions against the sun. Please come back for a follow-up appointment in 3 months. (update: i left message on phone-tree:  Reduce synthroid to 100/d)

## 2012-01-04 NOTE — Progress Notes (Signed)
Subjective:    Patient ID: Savannah [name], female    DOB: 1956-06-16, 56 y.o.   MRN: [id number]  HPI here for regular wellness examination.  she's feeling pretty well in general (except for fatigu fatigue, and says chronic med probs are stable, except as noted below.   She sees neurologist.  She is considering sleep study.  Gyn is dr Melba Coon. Past Medical History  Diagnosis Date  . THYROID NODULE 03/09/2010  . Postsurgical hypothyroidism 09/19/2010  . DIABETES MELLITUS, TYPE II 07/08/2007  . HYPERLIPIDEMIA 07/08/2007  . OBESITY 01/06/2008  . LEUKOPENIA, MILD 09/29/2008  . DEPRESSION 11/21/2007  . PERIPHERAL NEUROPATHY 11/21/2007  . HYPERTENSION 07/08/2007  . SINUSITIS- ACUTE-NOS 11/21/2007  . ALLERGIC RHINITIS 11/21/2007  . Eosinophilic esophagitis 12/21/7679  . Other chronic nonalcoholic liver disease 1/57/2620  . Acute cystitis 08/17/2009  . OSTEOARTHRITIS 01/06/2008  . FEVER UNSPECIFIED 10/20/2009  . Edema 05/20/2009  . Headache 02/06/2010  . DYSPNEA 05/20/2009  . ABDOMINAL PAIN, CHRONIC 10/20/2009  . Hepatomegaly 01/06/2008  . TB SKIN TEST, POSITIVE 02/06/2010  . FEVER, HX OF 03/09/2010  . Bariatric surgery status 07/07/2010  . ASYMPTOMATIC POSTMENOPAUSAL STATUS 09/29/2008  . Peripheral neuropathy     Past Surgical History  Procedure Date  . Abdominal hysterectomy   . Right total knee replacement   . Oophorectomy     History   Social History  . Marital Status: Single    Spouse Name: N/A    Number of Children: N/A  . Years of Education: N/A   Occupational History  . Not on file.   Social History Main Topics  . Smoking status: Former Research scientist (life sciences)  . Smokeless tobacco: Not on file  . Alcohol Use: Yes  . Drug Use: No  . Sexually Active:    Other Topics Concern  . Not on file   Social History Narrative  . No narrative on file    Current Outpatient Prescriptions on File Prior to Visit  Medication Sig Dispense Refill  . Cholecalciferol (VITAMIN D3) 400 UNITS tablet Take 400 Units by  mouth daily.        [name] docusate sodium (COLACE) 250 MG capsule Take 500 mg by mouth 2 (two) times daily.        . Flaxseed, Linseed, (FLAX SEED OIL PO) Take 1 capsule by mouth daily.        [name] glimepiride (AMARYL) 4 MG tablet Take 1 tablet (4 mg total) by mouth daily before breakfast.  60 tablet  11  . levothyroxine (SYNTHROID, LEVOTHROID) 150 MCG tablet Take 1 tablet (150 mcg total) by mouth daily.  30 tablet  11  . loratadine (CLARITIN) 10 MG tablet Take 10 mg by mouth daily.        . Multiple Vitamin (MULTIVITAMIN) tablet Take 1 tablet by mouth daily.        . ONE TOUCH ULTRA TEST test strip USE AS DIRECTED  100 each  2  . pantoprazole (PROTONIX) 40 MG tablet TAKE 1 TABLET BY MOUTH ONCE DAILY  30 tablet  5  . pregabalin (LYRICA) 150 MG capsule 1 tab each morning, and 2 at bedtime  90 capsule  5  . PROBIOTIC CAPS Take 1 capsule by mouth daily.        . Saxagliptin-Metformin 2.03-999 MG TB24 Take 2 tablets by mouth every morning.  60 tablet  11  . simvastatin (ZOCOR) 40 MG tablet Take 1/2 tablet by mouth once daily  30 tablet  3  . topiramate (TOPAMAX) 25  MG tablet Take 50 mg by mouth 2 (two) times daily.          Allergies  Allergen Reactions  . Ace Inhibitors     REACTION: Cough  . Caffeine-Sodium Benzoate     REACTION: PVCs    Family History  Problem Relation Age of Onset  . Diabetes Mother   . Diabetes Father   . Liver cancer Maternal Aunt     2 aunt    BP 124/72  Pulse 71  Temp(Src) 97.8 F (36.6 C) (Oral)  Ht 5' 11"  (1.803 m)  Wt 194 lb (87.998 kg)  BMI 27.06 kg/m2  SpO2 97%     Review of Systems  Constitutional: Negative for fever and unexpected weight change.  HENT: Negative for hearing loss.   Eyes: Negative for visual disturbance.  Respiratory: Negative for shortness of breath.   Cardiovascular: Negative for chest pain.  Gastrointestinal: Negative for anal bleeding.  Genitourinary: Negative for hematuria.  Musculoskeletal: Negative for gait problem.    Skin: Negative for rash.  Neurological: Negative for syncope and headaches.  Hematological: Does not bruise/bleed easily.  Psychiatric/Behavioral:       Intermittent depression  she has daytime somnolence, but she describes poor sleep habits.       Objective:   Physical Exam VS: see vs page GEN: no distress HEAD: head: no deformity eyes: no periorbital swelling, no proptosis external nose and ears are normal mouth: no lesion seen Neck: a healed scar is present.  i do not appreciate a nodule in the thyroid or elsewhere in the neck CHEST WALL: no deformity LUNGS:  Clear to auscultation BREASTS:  No mass.  No d/c CV: reg rate and rhythm, no murmur ABD: abdomen is soft, nontender.  no hepatosplenomegaly.  not distended.  no hernia.  Healed surgical scar GENITALIA:  Normal external female.  Normal bimanual exam RECTAL: normal external and internal exam.  heme neg MUSCULOSKELETAL: muscle bulk and strength are grossly normal.  no obvious joint swelling.  gait is normal and steady EXTEMITIES: no deformity.  no ulcer on the feet.  feet are of normal color and temp.  1+ bilat leg edema PULSES: dorsalis pedis intact bilat.  no carotid bruit NEURO:  cn 2-12 grossly intact.   readily moves all 4's.  sensation is intact to touch on the feet, but decreased from normal SKIN:  Normal texture and temperature.  No rash or suspicious lesion is visible.   NODES:  None palpable at the neck PSYCH: alert, oriented x3.  Does not appear anxious nor depressed.      Lab Results  Component Value Date   TSH 0.06* 01/04/2012      Assessment & Plan:  Wellness visit today, with problems stable, except as noted. Hypothyroidism, slightly overreplaced

## 2012-02-06 ENCOUNTER — Other Ambulatory Visit: Payer: Self-pay | Admitting: Endocrinology

## 2012-04-04 ENCOUNTER — Encounter: Payer: Self-pay | Admitting: Endocrinology

## 2012-04-04 ENCOUNTER — Ambulatory Visit (INDEPENDENT_AMBULATORY_CARE_PROVIDER_SITE_OTHER): Payer: Managed Care, Other (non HMO) | Admitting: Endocrinology

## 2012-04-04 ENCOUNTER — Other Ambulatory Visit (INDEPENDENT_AMBULATORY_CARE_PROVIDER_SITE_OTHER): Payer: Managed Care, Other (non HMO)

## 2012-04-04 VITALS — BP 130/90 | HR 72 | Temp 97.4°F | Ht 71.0 in | Wt 196.0 lb

## 2012-04-04 DIAGNOSIS — Z9884 Bariatric surgery status: Secondary | ICD-10-CM

## 2012-04-04 DIAGNOSIS — E119 Type 2 diabetes mellitus without complications: Secondary | ICD-10-CM

## 2012-04-04 DIAGNOSIS — E039 Hypothyroidism, unspecified: Secondary | ICD-10-CM

## 2012-04-04 LAB — HEMOGLOBIN A1C: Hgb A1c MFr Bld: 7.8 % — ABNORMAL HIGH (ref 4.6–6.5)

## 2012-04-04 LAB — VITAMIN B12: Vitamin B-12: 574 pg/mL (ref 211–911)

## 2012-04-04 LAB — TSH: TSH: 7.19 u[IU]/mL — ABNORMAL HIGH (ref 0.35–5.50)

## 2012-04-04 MED ORDER — LEVOTHYROXINE SODIUM 112 MCG PO TABS: 112.0000 ug | ORAL_TABLET | Freq: Every day | ORAL | Status: DC

## 2012-04-04 MED ORDER — BROMOCRIPTINE MESYLATE 2.5 MG PO TABS: 2.5000 mg | ORAL_TABLET | Freq: Every day | ORAL | Status: DC

## 2012-04-04 MED ORDER — TOPIRAMATE 25 MG PO TABS: 50.0000 mg | ORAL_TABLET | Freq: Two times a day (BID) | ORAL | Status: DC

## 2012-04-04 NOTE — Patient Instructions (Addendum)
blood tests are being requested for you today.  You will receive a letter with results.  Please come back for a follow-up appointment in 3 months.

## 2012-04-04 NOTE — Progress Notes (Signed)
**Note De-Identified Savannah Obfuscation** Subjective:    Patient ID: Savannah [name], female    DOB: 1955-11-28, 56 y.o.   MRN: [id number]  HPI The state of at least three ongoing medical problems is addressed today: Pt returns for f/u of type 2 DM (dx'ed 2001).  She stopped insulin after bariatric surgery.  no cbg record, but states cbg's are approx 200.  pt states she feels well in general. She had bariatric surgery 2 years ago.  She takes a multivitamin, but is still concerned about deficiencies. Hypothyroidism: synthroid was reduce at last ov.  pt states she feels well in general. Past Medical History  Diagnosis Date  . THYROID NODULE 03/09/2010  . Postsurgical hypothyroidism 09/19/2010  . DIABETES MELLITUS, TYPE II 07/08/2007  . HYPERLIPIDEMIA 07/08/2007  . OBESITY 01/06/2008  . LEUKOPENIA, MILD 09/29/2008  . DEPRESSION 11/21/2007  . PERIPHERAL NEUROPATHY 11/21/2007  . HYPERTENSION 07/08/2007  . SINUSITIS- ACUTE-NOS 11/21/2007  . ALLERGIC RHINITIS 11/21/2007  . Eosinophilic esophagitis 04/20/6788  . Other chronic nonalcoholic liver disease 3/81/0175  . Acute cystitis 08/17/2009  . OSTEOARTHRITIS 01/06/2008  . FEVER UNSPECIFIED 10/20/2009  . Edema 05/20/2009  . Headache 02/06/2010  . DYSPNEA 05/20/2009  . ABDOMINAL PAIN, CHRONIC 10/20/2009  . Hepatomegaly 01/06/2008  . TB SKIN TEST, POSITIVE 02/06/2010  . FEVER, HX OF 03/09/2010  . Bariatric surgery status 07/07/2010  . ASYMPTOMATIC POSTMENOPAUSAL STATUS 09/29/2008  . Peripheral neuropathy     Past Surgical History  Procedure Date  . Abdominal hysterectomy   . Right total knee replacement   . Oophorectomy     History   Social History  . Marital Status: Single    Spouse Name: N/A    Number of Children: N/A  . Years of Education: N/A   Occupational History  . Not on file.   Social History Main Topics  . Smoking status: Former Research scientist (life sciences)  . Smokeless tobacco: Not on file  . Alcohol Use: Yes  . Drug Use: No  . Sexually Active:    Other Topics Concern  . Not on file   Social  History Narrative  . No narrative on file    Current Outpatient Prescriptions on File Prior to Visit  Medication Sig Dispense Refill  . Cholecalciferol (VITAMIN D3) 400 UNITS tablet Take 400 Units by mouth daily.        [name] docusate sodium (COLACE) 250 MG capsule Take 500 mg by mouth 2 (two) times daily.        . Flaxseed, Linseed, (FLAX SEED OIL PO) Take 1 capsule by mouth daily.        [name] loratadine (CLARITIN) 10 MG tablet Take 10 mg by mouth daily.        . Multiple Vitamin (MULTIVITAMIN) tablet Take 1 tablet by mouth 2 (two) times daily.       . ONE TOUCH ULTRA TEST test strip USE AS DIRECTED  100 each  2  . pantoprazole (PROTONIX) 40 MG tablet TAKE 1 TABLET BY MOUTH ONCE DAILY  30 tablet  5  . pregabalin (LYRICA) 150 MG capsule 1 tab each morning, and 2 at bedtime  90 capsule  5  . Saxagliptin-Metformin 2.03-999 MG TB24 Take 2 tablets by mouth every morning.  60 tablet  11  . simvastatin (ZOCOR) 40 MG tablet take 1/2 tablet by mouth once daily  30 tablet  5  . topiramate (TOPAMAX) 25 MG tablet Take 2 tablets (50 mg total) by mouth 2 (two) times daily.  60 tablet  11  . bromocriptine (  PARLODEL) 2.5 MG tablet Take 1 tablet (2.5 mg total) by mouth at bedtime.  90 tablet  3    Allergies  Allergen Reactions  . Ace Inhibitors     REACTION: Cough  . Caffeine-Sodium Benzoate     REACTION: PVCs    Family History  Problem Relation Age of Onset  . Diabetes Mother   . Diabetes Father   . Liver cancer Maternal Aunt     2 aunt    BP 130/90  Pulse 72  Temp(Src) 97.4 F (36.3 C) (Oral)  Ht 5' 11"  (1.803 m)  Wt 196 lb (88.905 kg)  BMI 27.34 kg/m2  SpO2 97%  Review of Systems denies hypoglycemia and recent weight change    Objective:   Physical Exam VITAL SIGNS:  See vs page GENERAL: no distress Ext: no edema  Lab Results  Component Value Date   HGBA1C 7.8* 04/04/2012   Lab Results  Component Value Date   TSH 7.19* 04/04/2012      Assessment & Plan:  DM.  Needs increased  rx, if it can be done with a regimen that avoids or minimizes hypoglycemia. Bariatric surgery status.  She is at risk for deficiency of vit b-12 or -D Hypothyroidism.  she needs increased rx.

## 2012-04-05 LAB — VITAMIN D 25 HYDROXY (VIT D DEFICIENCY, FRACTURES): Vit D, 25-Hydroxy: 41 ng/mL (ref 30–89)

## 2012-04-08 LAB — PTH, INTACT AND CALCIUM
Calcium, Total (PTH): 9.1 mg/dL (ref 8.4–10.5)
PTH: 49.4 pg/mL (ref 14.0–72.0)

## 2012-05-05 LAB — HM DIABETES EYE EXAM

## 2012-05-14 ENCOUNTER — Other Ambulatory Visit: Payer: Self-pay | Admitting: Endocrinology

## 2012-05-14 NOTE — Telephone Encounter (Signed)
Rx faxed to [facility]

## 2012-05-14 NOTE — Telephone Encounter (Signed)
Please advise-last written 11/28/2011 #90 with 5 refills.

## 2012-05-28 ENCOUNTER — Other Ambulatory Visit (HOSPITAL_COMMUNITY): Payer: Self-pay | Admitting: Obstetrics and Gynecology

## 2012-05-28 DIAGNOSIS — D179 Benign lipomatous neoplasm, unspecified: Secondary | ICD-10-CM

## 2012-05-30 ENCOUNTER — Ambulatory Visit (HOSPITAL_COMMUNITY)
Admission: RE | Admit: 2012-05-30 | Discharge: 2012-05-30 | Disposition: A | Discharge status: Home / Self Care | Payer: Managed Care, Other (non HMO) | Source: Ambulatory Visit | Attending: Obstetrics and Gynecology | Admitting: Obstetrics and Gynecology

## 2012-05-30 ENCOUNTER — Other Ambulatory Visit: Payer: Self-pay | Admitting: Endocrinology

## 2012-05-30 DIAGNOSIS — Z8542 Personal history of malignant neoplasm of other parts of uterus: Secondary | ICD-10-CM | POA: Insufficient documentation

## 2012-05-30 DIAGNOSIS — D179 Benign lipomatous neoplasm, unspecified: Secondary | ICD-10-CM

## 2012-05-30 LAB — CREATININE, SERUM
Creatinine, Ser: 0.83 mg/dL (ref 0.50–1.10)
GFR calc Af Amer: 88 mL/min — ABNORMAL LOW (ref >90)
GFR calc non Af Amer: 76 mL/min — ABNORMAL LOW (ref >90)

## 2012-05-30 MED ORDER — GADOBENATE DIMEGLUMINE 529 MG/ML IV SOLN
20.0000 mL | Freq: Once | INTRAVENOUS | Status: AC | PRN
  Administered 2012-05-30: 20 mL via INTRAVENOUS

## 2012-07-04 ENCOUNTER — Ambulatory Visit (INDEPENDENT_AMBULATORY_CARE_PROVIDER_SITE_OTHER): Payer: Managed Care, Other (non HMO) | Admitting: Endocrinology

## 2012-07-04 ENCOUNTER — Other Ambulatory Visit (INDEPENDENT_AMBULATORY_CARE_PROVIDER_SITE_OTHER): Payer: Managed Care, Other (non HMO)

## 2012-07-04 ENCOUNTER — Encounter: Payer: Self-pay | Admitting: Endocrinology

## 2012-07-04 VITALS — BP 138/82 | HR 62 | Temp 97.7°F | Ht 68.0 in | Wt 205.0 lb

## 2012-07-04 DIAGNOSIS — E039 Hypothyroidism, unspecified: Secondary | ICD-10-CM

## 2012-07-04 DIAGNOSIS — R609 Edema, unspecified: Secondary | ICD-10-CM

## 2012-07-04 DIAGNOSIS — E119 Type 2 diabetes mellitus without complications: Secondary | ICD-10-CM

## 2012-07-04 DIAGNOSIS — E890 Postprocedural hypothyroidism: Secondary | ICD-10-CM

## 2012-07-04 LAB — BRAIN NATRIURETIC PEPTIDE: Pro B Natriuretic peptide (BNP): 51 pg/mL (ref 0.0–100.0)

## 2012-07-04 LAB — TSH: TSH: 5.77 u[IU]/mL — ABNORMAL HIGH (ref 0.35–5.50)

## 2012-07-04 LAB — HEMOGLOBIN A1C: Hgb A1c MFr Bld: 7.2 % — ABNORMAL HIGH (ref 4.6–6.5)

## 2012-07-04 NOTE — Patient Instructions (Addendum)
blood tests are being requested for you today.  You will receive a letter with results.  Please come back for a follow-up appointment in 3 months. 

## 2012-07-04 NOTE — Progress Notes (Signed)
Subjective:    Patient ID: Savannah [name], female    DOB: September 06, 1956, 56 y.o.   MRN: [id number]  HPI The state of at least three ongoing medical problems is addressed today: Pt returns for f/u of type 2 DM (dx'ed 2001; complicated by peripheral sensory neuropathy; she has been off insulin since bariatric surgery in 2011).  She did not start the parlodel. She seldom checks cbg's.  She says the care of her dm is compromised by frequent business travel).   Postsurgical hypothyroidism: fatigue is slightly better on increased synthyroid Edema: has recurred. Past Medical History  Diagnosis Date  . THYROID NODULE 03/09/2010  . Postsurgical hypothyroidism 09/19/2010  . DIABETES MELLITUS, TYPE II 07/08/2007  . HYPERLIPIDEMIA 07/08/2007  . OBESITY 01/06/2008  . LEUKOPENIA, MILD 09/29/2008  . DEPRESSION 11/21/2007  . PERIPHERAL NEUROPATHY 11/21/2007  . HYPERTENSION 07/08/2007  . SINUSITIS- ACUTE-NOS 11/21/2007  . ALLERGIC RHINITIS 11/21/2007  . Eosinophilic esophagitis 02/13/2008  . Other chronic nonalcoholic liver disease 01/06/2008  . Acute cystitis 08/17/2009  . OSTEOARTHRITIS 01/06/2008  . FEVER UNSPECIFIED 10/20/2009  . Edema 05/20/2009  . Headache 02/06/2010  . DYSPNEA 05/20/2009  . ABDOMINAL PAIN, CHRONIC 10/20/2009  . Hepatomegaly 01/06/2008  . TB SKIN TEST, POSITIVE 02/06/2010  . FEVER, HX OF 03/09/2010  . Bariatric surgery status 07/07/2010  . ASYMPTOMATIC POSTMENOPAUSAL STATUS 09/29/2008  . Peripheral neuropathy     Past Surgical History  Procedure Date  . Abdominal hysterectomy   . Right total knee replacement   . Oophorectomy     History   Social History  . Marital Status: Single    Spouse Name: N/A    Number of Children: N/A  . Years of Education: N/A   Occupational History  . Not on file.   Social History Main Topics  . Smoking status: Former Games developer  . Smokeless tobacco: Not on file  . Alcohol Use: Yes  . Drug Use: No  . Sexually Active:    Other Topics Concern  . Not on file    Social History Narrative  . No narrative on file    Current Outpatient Prescriptions on File Prior to Visit  Medication Sig Dispense Refill  . Cholecalciferol (VITAMIN D3) 400 UNITS tablet Take 400 Units by mouth daily.        [name] docusate sodium (COLACE) 250 MG capsule Take 500 mg by mouth 2 (two) times daily.        . Flaxseed, Linseed, (FLAX SEED OIL PO) Take 1 capsule by mouth daily.        [name] levothyroxine (SYNTHROID, LEVOTHROID) 112 MCG tablet Take 1 tablet (112 mcg total) by mouth daily.  90 tablet  3  . loratadine (CLARITIN) 10 MG tablet Take 10 mg by mouth daily.        [name] LYRICA 150 MG capsule take 1 capsule by mouth every morning and 2 capsules by mouth at bedtime  90 capsule  5  . Multiple Vitamin (MULTIVITAMIN) tablet Take 1 tablet by mouth 2 (two) times daily.       . ONE TOUCH ULTRA TEST test strip USE AS DIRECTED  100 each  2  . pantoprazole (PROTONIX) 40 MG tablet TAKE 1 TABLET BY MOUTH ONCE DAILY  30 tablet  5  . Saxagliptin-Metformin 2.03-999 MG TB24 Take 2 tablets by mouth every morning.  60 tablet  11  . simvastatin (ZOCOR) 40 MG tablet take 1/2 tablet by mouth once daily  30 tablet  5  . topiramate (TOPAMAX)  25 MG tablet Take 2 tablets (50 mg total) by mouth 2 (two) times daily.  60 tablet  11  . bromocriptine (PARLODEL) 2.5 MG tablet Take 1 tablet (2.5 mg total) by mouth at bedtime.  90 tablet  3    Allergies  Allergen Reactions  . Ace Inhibitors     REACTION: Cough  . Caffeine-Sodium Benzoate     REACTION: PVCs    Family History  Problem Relation Age of Onset  . Diabetes Mother   . Diabetes Father   . Liver cancer Maternal Aunt     2 aunt    BP 138/82  Pulse 62  Temp 97.7 F (36.5 C) (Oral)  Ht 5\' 8"  (1.727 m)  Wt 205 lb (92.987 kg)  BMI 31.17 kg/m2  SpO2 98%  Review of Systems Denies weight change and sob    Objective:   Physical Exam VITAL SIGNS:  See vs page GENERAL: no distress Pulses: dorsalis pedis intact bilat.   Feet: no deformity.   no ulcer on the feet.  feet are of normal color and temp.  1+ bilat leg edema Neuro: sensation is intact to touch on the feet  BNP=normal Lab Results  Component Value Date   HGBA1C 7.2* 07/04/2012      Assessment & Plan:  Edema, persistent: he declines lasix DM: Needs increased rx, if it can be done with a regimen that avoids or minimizes hypoglycemia. postsurgical hypothyroidism, with improved replacement

## 2012-07-07 ENCOUNTER — Telehealth: Payer: Self-pay | Admitting: *Deleted

## 2012-07-07 NOTE — Telephone Encounter (Signed)
Called pt to inform of lab results, pt informed via VM and to callback office with any questions/concerns (letter also mailed to pt).  

## 2012-08-31 ENCOUNTER — Other Ambulatory Visit: Payer: Self-pay | Admitting: Endocrinology

## 2012-09-26 ENCOUNTER — Other Ambulatory Visit: Payer: Self-pay | Admitting: Endocrinology

## 2012-09-29 ENCOUNTER — Other Ambulatory Visit: Payer: Self-pay | Admitting: Endocrinology

## 2012-09-29 MED ORDER — LEVOTHYROXINE SODIUM 112 MCG PO TABS: 112.0000 ug | ORAL_TABLET | Freq: Every day | ORAL | Status: DC

## 2012-10-03 ENCOUNTER — Ambulatory Visit: Payer: Managed Care, Other (non HMO) | Admitting: Endocrinology

## 2012-10-24 ENCOUNTER — Ambulatory Visit: Payer: Managed Care, Other (non HMO) | Admitting: Endocrinology

## 2012-10-29 ENCOUNTER — Other Ambulatory Visit: Payer: Self-pay | Admitting: Endocrinology

## 2012-12-08 ENCOUNTER — Other Ambulatory Visit: Payer: Self-pay | Admitting: Endocrinology

## 2013-01-29 ENCOUNTER — Other Ambulatory Visit: Payer: Self-pay

## 2013-01-29 MED ORDER — TOPIRAMATE 25 MG PO TABS: 50.0000 mg | ORAL_TABLET | Freq: Two times a day (BID) | ORAL | Status: DC

## 2013-01-30 ENCOUNTER — Encounter: Payer: Self-pay | Admitting: Endocrinology

## 2013-01-30 ENCOUNTER — Ambulatory Visit (INDEPENDENT_AMBULATORY_CARE_PROVIDER_SITE_OTHER): Payer: Managed Care, Other (non HMO) | Admitting: Endocrinology

## 2013-01-30 VITALS — BP 126/78 | HR 76 | Wt 207.0 lb

## 2013-01-30 DIAGNOSIS — Q309 Congenital malformation of nose, unspecified: Secondary | ICD-10-CM

## 2013-01-30 DIAGNOSIS — K7689 Other specified diseases of liver: Secondary | ICD-10-CM

## 2013-01-30 DIAGNOSIS — IMO0001 Reserved for inherently not codable concepts without codable children: Secondary | ICD-10-CM

## 2013-01-30 DIAGNOSIS — E785 Hyperlipidemia, unspecified: Secondary | ICD-10-CM

## 2013-01-30 DIAGNOSIS — E890 Postprocedural hypothyroidism: Secondary | ICD-10-CM

## 2013-01-30 DIAGNOSIS — Z79899 Other long term (current) drug therapy: Secondary | ICD-10-CM

## 2013-01-30 DIAGNOSIS — E119 Type 2 diabetes mellitus without complications: Secondary | ICD-10-CM

## 2013-01-30 DIAGNOSIS — Q308 Other congenital malformations of nose: Secondary | ICD-10-CM

## 2013-01-30 DIAGNOSIS — I10 Essential (primary) hypertension: Secondary | ICD-10-CM

## 2013-01-30 DIAGNOSIS — Z9884 Bariatric surgery status: Secondary | ICD-10-CM

## 2013-01-30 LAB — MICROALBUMIN / CREATININE URINE RATIO
Creatinine,U: 99.8 mg/dL
Microalb Creat Ratio: 1.4 mg/g (ref 0.0–30.0)
Microalb, Ur: 1.4 mg/dL (ref 0.0–1.9)

## 2013-01-30 LAB — HEPATIC FUNCTION PANEL
ALT: 39 U/L — ABNORMAL HIGH (ref 0–35)
AST: 34 U/L (ref 0–37)
Albumin: 3.9 g/dL (ref 3.5–5.2)
Alkaline Phosphatase: 125 U/L — ABNORMAL HIGH (ref 39–117)
Bilirubin, Direct: 0 mg/dL (ref 0.0–0.3)
Total Bilirubin: 0.7 mg/dL (ref 0.3–1.2)
Total Protein: 6.8 g/dL (ref 6.0–8.3)

## 2013-01-30 LAB — CBC WITH DIFFERENTIAL/PLATELET
Basophils Absolute: 0 10*3/uL (ref 0.0–0.1)
Basophils Relative: 0.6 % (ref 0.0–3.0)
Eosinophils Absolute: 0.1 10*3/uL (ref 0.0–0.7)
Eosinophils Relative: 2.2 % (ref 0.0–5.0)
HCT: 35.8 % — ABNORMAL LOW (ref 36.0–46.0)
Hemoglobin: 12.3 g/dL (ref 12.0–15.0)
Lymphocytes Relative: 29.8 % (ref 12.0–46.0)
Lymphs Abs: 0.9 10*3/uL (ref 0.7–4.0)
MCHC: 34.3 g/dL (ref 30.0–36.0)
MCV: 90.2 fl (ref 78.0–100.0)
Monocytes Absolute: 0.3 10*3/uL (ref 0.1–1.0)
Monocytes Relative: 9 % (ref 3.0–12.0)
Neutro Abs: 1.7 10*3/uL (ref 1.4–7.7)
Neutrophils Relative %: 58.4 % (ref 43.0–77.0)
Platelets: 118 10*3/uL — ABNORMAL LOW (ref 150.0–400.0)
RBC: 3.97 Mil/uL (ref 3.87–5.11)
RDW: 14.3 % (ref 11.5–14.6)
WBC: 2.9 10*3/uL — ABNORMAL LOW (ref 4.5–10.5)

## 2013-01-30 LAB — URINALYSIS, ROUTINE W REFLEX MICROSCOPIC
Bilirubin Urine: NEGATIVE
Hgb urine dipstick: NEGATIVE
Ketones, ur: NEGATIVE
Leukocytes, UA: NEGATIVE
Nitrite: NEGATIVE
Specific Gravity, Urine: 1.02 (ref 1.000–1.030)
Total Protein, Urine: NEGATIVE
Urine Glucose: NEGATIVE
Urobilinogen, UA: 0.2 (ref 0.0–1.0)
pH: 6 (ref 5.0–8.0)

## 2013-01-30 LAB — LIPID PANEL
Cholesterol: 157 mg/dL (ref 0–200)
HDL: 34 mg/dL — ABNORMAL LOW (ref >39.00)
LDL Cholesterol: 99 mg/dL (ref 0–99)
Total CHOL/HDL Ratio: 5
Triglycerides: 122 mg/dL (ref 0.0–149.0)
VLDL: 24.4 mg/dL (ref 0.0–40.0)

## 2013-01-30 LAB — HEMOGLOBIN A1C: Hgb A1c MFr Bld: 8.4 % — ABNORMAL HIGH (ref 4.6–6.5)

## 2013-01-30 LAB — IBC PANEL
Iron: 56 ug/dL (ref 42–145)
Saturation Ratios: 15.9 % — ABNORMAL LOW (ref 20.0–50.0)
Transferrin: 251.5 mg/dL (ref 212.0–360.0)

## 2013-01-30 LAB — BASIC METABOLIC PANEL
BUN: 18 mg/dL (ref 6–23)
CO2: 25 mEq/L (ref 19–32)
Calcium: 8.8 mg/dL (ref 8.4–10.5)
Chloride: 107 mEq/L (ref 96–112)
Creatinine, Ser: 0.8 mg/dL (ref 0.4–1.2)
GFR: 75.91 mL/min (ref >60.00)
Glucose, Bld: 198 mg/dL — ABNORMAL HIGH (ref 70–99)
Potassium: 3.7 mEq/L (ref 3.5–5.1)
Sodium: 140 mEq/L (ref 135–145)

## 2013-01-30 LAB — TSH: TSH: 2.91 u[IU]/mL (ref 0.35–5.50)

## 2013-01-30 LAB — SEDIMENTATION RATE: Sed Rate: 40 mm/hr — ABNORMAL HIGH (ref 0–22)

## 2013-01-30 NOTE — Patient Instructions (Addendum)
Refer to an ENT specialist.  you will receive a phone call, about a day and time for an appointment.   blood tests are being requested for you today.  We'll contact you with results.  If your blood sugar is high, we'll add "nateglinide." Please come back for a regular physical appointment in 3 months.

## 2013-01-30 NOTE — Progress Notes (Signed)
Subjective:    Patient ID: Savannah [name], female    DOB: 1956-09-08, 57 y.o.   MRN: [id number]  HPI Pt states few mos of slight recurrent scab formation in both nostrils, and assoc pain.   Pt returns for f/u of type 2 DM (dx'ed 2001; complicated by peripheral sensory neuropathy; she has been off insulin since bariatric surgery in 2011; she seldom checks cbg's; she says the care of her dm is compromised by frequent business travel; she declines actos).  She says cbg's have increased recently. Past Medical History  Diagnosis Date  . THYROID NODULE 03/09/2010  . Postsurgical hypothyroidism 09/19/2010  . DIABETES MELLITUS, TYPE II 07/08/2007  . HYPERLIPIDEMIA 07/08/2007  . OBESITY 01/06/2008  . LEUKOPENIA, MILD 09/29/2008  . DEPRESSION 11/21/2007  . PERIPHERAL NEUROPATHY 11/21/2007  . HYPERTENSION 07/08/2007  . SINUSITIS- ACUTE-NOS 11/21/2007  . ALLERGIC RHINITIS 11/21/2007  . Eosinophilic esophagitis 02/13/2008  . Other chronic nonalcoholic liver disease 01/06/2008  . Acute cystitis 08/17/2009  . OSTEOARTHRITIS 01/06/2008  . FEVER UNSPECIFIED 10/20/2009  . Edema 05/20/2009  . Headache 02/06/2010  . DYSPNEA 05/20/2009  . ABDOMINAL PAIN, CHRONIC 10/20/2009  . Hepatomegaly 01/06/2008  . TB SKIN TEST, POSITIVE 02/06/2010  . FEVER, HX OF 03/09/2010  . Bariatric surgery status 07/07/2010  . ASYMPTOMATIC POSTMENOPAUSAL STATUS 09/29/2008  . Peripheral neuropathy     Past Surgical History  Procedure Laterality Date  . Abdominal hysterectomy    . Right total knee replacement    . Oophorectomy      History   Social History  . Marital Status: Single    Spouse Name: N/A    Number of Children: N/A  . Years of Education: N/A   Occupational History  . Not on file.   Social History Main Topics  . Smoking status: Former Games developer  . Smokeless tobacco: Not on file  . Alcohol Use: Yes  . Drug Use: No  . Sexually Active:    Other Topics Concern  . Not on file   Social History Narrative  . No narrative on  file    Current Outpatient Prescriptions on File Prior to Visit  Medication Sig Dispense Refill  . bromocriptine (PARLODEL) 2.5 MG tablet TAKE 1 TABLET BY MOUTH ONCE AT BEDTIME  30 tablet  1  . Cholecalciferol (VITAMIN D3) 400 UNITS tablet Take 400 Units by mouth daily.        [name] docusate sodium (COLACE) 250 MG capsule Take 500 mg by mouth 2 (two) times daily.        . Flaxseed, Linseed, (FLAX SEED OIL PO) Take 1 capsule by mouth daily.        [name] KOMBIGLYZE XR 2.03-999 MG TB24 TAKE 2 TABLETS BY MOUTH EVERY MORNING  60 tablet  2  . levothyroxine (SYNTHROID, LEVOTHROID) 112 MCG tablet Take 1 tablet (112 mcg total) by mouth daily.  90 tablet  3  . loratadine (CLARITIN) 10 MG tablet Take 10 mg by mouth daily.        [name] LYRICA 150 MG capsule take 1 capsule by mouth every morning and 2 capsules by mouth at bedtime  90 each  5  . Multiple Vitamin (MULTIVITAMIN) tablet Take 1 tablet by mouth 2 (two) times daily.       . ONE TOUCH ULTRA TEST test strip USE AS DIRECTED  100 each  2  . pantoprazole (PROTONIX) 40 MG tablet take 1 tablet by mouth once daily  30 tablet  2  . simvastatin (ZOCOR) 40  MG tablet take 1/2 tablet by mouth once daily  30 tablet  5  . topiramate (TOPAMAX) 25 MG tablet Take 2 tablets (50 mg total) by mouth 2 (two) times daily.  120 tablet  11   No current facility-administered medications on file prior to visit.    Allergies  Allergen Reactions  . Ace Inhibitors     REACTION: Cough  . Caffeine-Sodium Benzoate     REACTION: PVCs    Family History  Problem Relation Age of Onset  . Diabetes Mother   . Diabetes Father   . Liver cancer Maternal Aunt     2 aunt    BP 126/78  Pulse 76  Wt 207 lb (93.895 kg)  BMI 31.48 kg/m2  SpO2 98%   Review of Systems Denies fever.  She has fatigue and myalgias.    Objective:   Physical Exam VITAL SIGNS:  See vs page GENERAL: no distress Nares: slight pustules bilaterally  Lab Results  Component Value Date   WBC 2.9*  01/30/2013   HGB 12.3 01/30/2013   HCT 35.8* 01/30/2013   PLT 118.0* 01/30/2013   GLUCOSE 198* 01/30/2013   CHOL 157 01/30/2013   TRIG 122.0 01/30/2013   HDL 34.00* 01/30/2013   LDLDIRECT 99.0 10/12/2011   LDLCALC 99 01/30/2013   ALT 39* 01/30/2013   AST 34 01/30/2013   NA 140 01/30/2013   K 3.7 01/30/2013   CL 107 01/30/2013   CREATININE 0.8 01/30/2013   BUN 18 01/30/2013   CO2 25 01/30/2013   TSH 2.91 01/30/2013   INR 1.6* 10/22/2008   HGBA1C 8.4* 01/30/2013   MICROALBUR 1.4 01/30/2013      Assessment & Plan:  DM: needs increased rx Nasal sxs, new, uncertain etiology [place], recurrent Leukopenia, persistent Thrombocytopenia, chronic

## 2013-01-31 MED ORDER — NATEGLINIDE 120 MG PO TABS: 120.0000 mg | ORAL_TABLET | Freq: Three times a day (TID) | ORAL | Status: DC

## 2013-02-02 LAB — PTH, INTACT AND CALCIUM
Calcium, Total (PTH): 8.9 mg/dL (ref 8.4–10.5)
PTH: 33.1 pg/mL (ref 14.0–72.0)

## 2013-02-10 ENCOUNTER — Other Ambulatory Visit: Payer: Self-pay | Admitting: Endocrinology

## 2013-02-11 ENCOUNTER — Other Ambulatory Visit: Payer: Self-pay | Admitting: *Deleted

## 2013-02-11 ENCOUNTER — Other Ambulatory Visit: Payer: Self-pay | Admitting: Endocrinology

## 2013-02-11 MED ORDER — PANTOPRAZOLE SODIUM 40 MG PO TBEC
40.0000 mg | DELAYED_RELEASE_TABLET | Freq: Every day | ORAL | Status: DC
Start: 2013-02-11 — End: 2013-04-30

## 2013-02-11 MED ORDER — BROMOCRIPTINE MESYLATE 2.5 MG PO TABS: ORAL_TABLET | ORAL | Status: DC

## 2013-02-12 ENCOUNTER — Other Ambulatory Visit: Payer: Self-pay | Admitting: *Deleted

## 2013-02-12 MED ORDER — SAXAGLIPTIN-METFORMIN ER 2.5-1000 MG PO TB24: ORAL_TABLET | ORAL | Status: DC

## 2013-02-13 ENCOUNTER — Other Ambulatory Visit: Payer: Self-pay | Admitting: Endocrinology

## 2013-02-16 ENCOUNTER — Other Ambulatory Visit: Payer: Self-pay | Admitting: *Deleted

## 2013-02-16 MED ORDER — SIMVASTATIN 40 MG PO TABS: 40.0000 mg | ORAL_TABLET | Freq: Every day | ORAL | Status: DC

## 2013-03-08 ENCOUNTER — Other Ambulatory Visit: Payer: Self-pay | Admitting: Endocrinology

## 2013-03-10 ENCOUNTER — Other Ambulatory Visit: Payer: Self-pay

## 2013-03-27 ENCOUNTER — Telehealth: Payer: Self-pay

## 2013-03-27 MED ORDER — GABAPENTIN 600 MG PO TABS: 600.0000 mg | ORAL_TABLET | Freq: Three times a day (TID) | ORAL | Status: DC

## 2013-03-27 NOTE — Telephone Encounter (Signed)
i sent rx Referral was done 01/30/13.  Please send inquiry to [facility]

## 2013-03-27 NOTE — Telephone Encounter (Signed)
Pt advised will forward message to [facility] at [facility]

## 2013-03-27 NOTE — Telephone Encounter (Signed)
Pt would like to know status of referral for fibromyalgia, pt states ins. Co would like her to change lyrica to generic Neurontin

## 2013-04-30 ENCOUNTER — Other Ambulatory Visit: Payer: Self-pay | Admitting: *Deleted

## 2013-04-30 MED ORDER — SAXAGLIPTIN-METFORMIN ER 2.5-1000 MG PO TB24: ORAL_TABLET | ORAL | Status: DC

## 2013-04-30 MED ORDER — PANTOPRAZOLE SODIUM 40 MG PO TBEC: 40.0000 mg | DELAYED_RELEASE_TABLET | Freq: Every day | ORAL | Status: DC

## 2013-05-04 ENCOUNTER — Other Ambulatory Visit: Payer: Self-pay | Admitting: *Deleted

## 2013-05-04 MED ORDER — SAXAGLIPTIN-METFORMIN ER 2.5-1000 MG PO TB24: ORAL_TABLET | ORAL | Status: DC

## 2013-05-04 MED ORDER — BROMOCRIPTINE MESYLATE 2.5 MG PO TABS: ORAL_TABLET | ORAL | Status: DC

## 2013-05-04 NOTE — Telephone Encounter (Signed)
Rx did not go through on 04/30/13. Resending via escript.

## 2013-06-05 ENCOUNTER — Telehealth: Payer: Self-pay

## 2013-06-05 ENCOUNTER — Other Ambulatory Visit: Payer: Self-pay | Admitting: Endocrinology

## 2013-06-05 MED ORDER — METFORMIN HCL ER 500 MG PO TB24: 1000.0000 mg | ORAL_TABLET | Freq: Two times a day (BID) | ORAL | Status: DC

## 2013-06-05 NOTE — Telephone Encounter (Signed)
Pt states she can not afford rx's would like to get rx for Kombiglyze changed to Metformin, also pt states she has not been taking Bromicriptine, and would like to know if there is a susbtitiute for Lyrica, if not pt states she will pay for the Lyrica.

## 2013-06-05 NOTE — Telephone Encounter (Signed)
Ok, i changed. The alternative to lyrica is gabapentin. cpx is due

## 2013-06-05 NOTE — Telephone Encounter (Signed)
Pt called back would like you to change rx from Kombiglyze to Metformin

## 2013-06-05 NOTE — Telephone Encounter (Signed)
ok 

## 2013-06-05 NOTE — Telephone Encounter (Signed)
Pt states she does not have ins until September, pt states gabapentin upsets her stomach, she prefers lyrica

## 2013-06-05 NOTE — Telephone Encounter (Signed)
i already sent rx

## 2013-06-26 ENCOUNTER — Telehealth: Payer: Self-pay | Admitting: Endocrinology

## 2013-06-26 MED ORDER — METFORMIN HCL ER 500 MG PO TB24: 1000.0000 mg | ORAL_TABLET | Freq: Two times a day (BID) | ORAL | Status: DC

## 2013-06-26 NOTE — Telephone Encounter (Signed)
Please refill the metformin x 3 months

## 2013-08-10 ENCOUNTER — Other Ambulatory Visit: Payer: Self-pay

## 2013-08-10 MED ORDER — PANTOPRAZOLE SODIUM 40 MG PO TBEC: 40.0000 mg | DELAYED_RELEASE_TABLET | Freq: Every day | ORAL | Status: DC

## 2013-09-17 ENCOUNTER — Other Ambulatory Visit: Payer: Self-pay

## 2013-10-07 ENCOUNTER — Other Ambulatory Visit: Payer: Self-pay | Admitting: *Deleted

## 2013-10-07 ENCOUNTER — Other Ambulatory Visit: Payer: Self-pay | Admitting: Endocrinology

## 2013-10-07 MED ORDER — LEVOTHYROXINE SODIUM 112 MCG PO TABS: 112.0000 ug | ORAL_TABLET | Freq: Every day | ORAL | Status: DC

## 2013-10-07 NOTE — Telephone Encounter (Signed)
Please refill x 1 Ov is due  

## 2013-10-16 ENCOUNTER — Ambulatory Visit (INDEPENDENT_AMBULATORY_CARE_PROVIDER_SITE_OTHER): Payer: BC Managed Care – PPO | Admitting: Endocrinology

## 2013-10-16 ENCOUNTER — Encounter: Payer: Self-pay | Admitting: Endocrinology

## 2013-10-16 ENCOUNTER — Ambulatory Visit: Payer: 59 | Admitting: Endocrinology

## 2013-10-16 VITALS — BP 112/62 | HR 71 | Temp 98.0°F | Ht 68.0 in | Wt 200.0 lb

## 2013-10-16 DIAGNOSIS — E890 Postprocedural hypothyroidism: Secondary | ICD-10-CM

## 2013-10-16 DIAGNOSIS — E119 Type 2 diabetes mellitus without complications: Secondary | ICD-10-CM

## 2013-10-16 LAB — HEMOGLOBIN A1C: Hgb A1c MFr Bld: 10.3 % — ABNORMAL HIGH (ref 4.6–6.5)

## 2013-10-16 MED ORDER — PREGABALIN 150 MG PO CAPS: ORAL_CAPSULE | ORAL | Status: DC

## 2013-10-16 MED ORDER — OXYCODONE-ACETAMINOPHEN 10-325 MG PO TABS
1.0000 | ORAL_TABLET | ORAL | Status: DC | PRN
Start: 2013-10-16 — End: 2014-03-16

## 2013-10-16 MED ORDER — TRAMADOL HCL 50 MG PO TABS: 50.0000 mg | ORAL_TABLET | Freq: Four times a day (QID) | ORAL | Status: DC | PRN

## 2013-10-16 NOTE — Progress Notes (Signed)
Subjective:    Patient ID: Savannah [name], female    DOB: [birth year], 57 y.o.   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 ).  no cbg record, but states cbg's are in the 200's.    Past Medical History  Diagnosis Date  . THYROID NODULE 03/09/2010  . Postsurgical hypothyroidism 09/19/2010  . DIABETES MELLITUS, TYPE II 07/08/2007  . HYPERLIPIDEMIA 07/08/2007  . OBESITY 01/06/2008  . LEUKOPENIA, MILD 09/29/2008  . DEPRESSION 11/21/2007  . PERIPHERAL NEUROPATHY 11/21/2007  . HYPERTENSION 07/08/2007  . SINUSITIS- ACUTE-NOS 11/21/2007  . ALLERGIC RHINITIS 11/21/2007  . Eosinophilic esophagitis 02/13/2008  . Other chronic nonalcoholic liver disease 01/06/2008  . Acute cystitis 08/17/2009  . OSTEOARTHRITIS 01/06/2008  . FEVER UNSPECIFIED 10/20/2009  . Edema 05/20/2009  . Headache(784.0) 02/06/2010  . DYSPNEA 05/20/2009  . ABDOMINAL PAIN, CHRONIC 10/20/2009  . Hepatomegaly 01/06/2008  . TB SKIN TEST, POSITIVE 02/06/2010  . FEVER, HX OF 03/09/2010  . Bariatric surgery status 07/07/2010  . ASYMPTOMATIC POSTMENOPAUSAL STATUS 09/29/2008  . Peripheral neuropathy     Past Surgical History  Procedure Laterality Date  . Abdominal hysterectomy    . Right total knee replacement    . Oophorectomy      History   Social History  . Marital Status: Single    Spouse Name: N/A    Number of Children: N/A  . Years of Education: N/A   Occupational History  . Not on file.   Social History Main Topics  . Smoking status: Former Games developer  . Smokeless tobacco: Not on file  . Alcohol Use: Yes  . Drug Use: No  . Sexual Activity:    Other Topics Concern  . Not on file   Social History Narrative    . No narrative on file    Current Outpatient Prescriptions on File Prior to Visit  Medication Sig Dispense Refill  . Cholecalciferol (VITAMIN D3) 400 UNITS tablet Take 400 Units by mouth daily.        [name] docusate sodium (COLACE) 250 MG capsule Take 500 mg by mouth 2 (two) times daily.        . Flaxseed, Linseed, (FLAX SEED OIL PO) Take 1 capsule by mouth daily.        [name] levothyroxine (SYNTHROID, LEVOTHROID) 112 MCG tablet Take 1 tablet (112 mcg total) by mouth daily.  90 tablet  3  . loratadine (CLARITIN) 10 MG tablet Take 10 mg by mouth daily.        . metFORMIN (GLUCOPHAGE-XR) 500 MG 24 hr tablet Take 2 tablets (1,000 mg total) by mouth 2 (two) times daily.  120 tablet  11  . Multiple Vitamin (MULTIVITAMIN) tablet Take 1 tablet by mouth 2 (two) times daily.       . nateglinide (STARLIX) 120 MG tablet Take 1 tablet (120 mg total) by mouth 3 (three) times daily before meals.  90 tablet  11  . ONE TOUCH ULTRA TEST test strip USE AS DIRECTED  100 each  2  . pantoprazole (PROTONIX) 40 MG tablet Take 1 tablet (40 mg total) by mouth daily.  30 tablet  2  . simvastatin (ZOCOR) 40 MG  tablet Take 1 tablet (40 mg total) by mouth at bedtime.  30 tablet  5  . topiramate (TOPAMAX) 25 MG tablet Take 2 tablets (50 mg total) by mouth 2 (two) times daily.  120 tablet  11   No current facility-administered medications on file prior to visit.    Allergies  Allergen Reactions  . Ace Inhibitors     REACTION: Cough  . Caffeine-Sodium Benzoate     REACTION: PVCs    Family History  Problem Relation Age of Onset  . Diabetes Mother   . Diabetes Father   . Liver cancer Maternal Aunt     2 aunt    BP 112/62  Pulse 71  Temp(Src) 98 F (36.7 C) (Oral)  Ht 5\' 8"  (1.727 m)  Wt 200 lb (90.719 kg)  BMI 30.42 kg/m2  SpO2 97%  Review of Systems She has lost a few lbs.  Painful foot neuropathy is worse recently.      Objective:   Physical Exam VITAL SIGNS:  See vs page GENERAL: no distress  Lab  Results  Component Value Date   HGBA1C 10.3* 10/16/2013      Assessment & Plan:  DM: she needs increased rx Neuropathic pain: not well-controlled Bariatric surgery state: she has again lost some weight. Occupational status: this complicates the rx of DM

## 2013-10-16 NOTE — Patient Instructions (Addendum)
blood tests are being requested for you today.  We'll contact you with results.  If your blood sugar is high, we'll add kombiglyze and invokana, on a 58-month prescription.   Please come back for a regular physical appointment in 3 months.  Here are 2 prescriptions: ultram (mild pain), and percocet (severe pain).

## 2013-10-26 ENCOUNTER — Other Ambulatory Visit: Payer: Self-pay | Admitting: *Deleted

## 2013-10-26 MED ORDER — PANTOPRAZOLE SODIUM 40 MG PO TBEC: 40.0000 mg | DELAYED_RELEASE_TABLET | Freq: Every day | ORAL | Status: DC

## 2013-10-29 ENCOUNTER — Telehealth: Payer: Self-pay | Admitting: Endocrinology

## 2013-10-29 MED ORDER — METFORMIN HCL ER 500 MG PO TB24: ORAL_TABLET | ORAL | Status: DC

## 2013-10-29 MED ORDER — SAXAGLIPTIN-METFORMIN ER 2.5-1000 MG PO TB24: 2.0000 | ORAL_TABLET | ORAL | Status: DC

## 2013-10-29 MED ORDER — SAXAGLIPTIN HCL 5 MG PO TABS: 5.0000 mg | ORAL_TABLET | Freq: Every day | ORAL | Status: DC

## 2013-10-29 NOTE — Telephone Encounter (Signed)
Ok, i'll do the PA for onglyza.

## 2013-10-29 NOTE — Telephone Encounter (Signed)
please call patient: Per insurance, i have changed to komblglyze-XR.  i have sent a prescription to your pharmacy.

## 2013-10-29 NOTE — Telephone Encounter (Signed)
Form placed on desk.

## 2013-10-29 NOTE — Telephone Encounter (Signed)
Received PA for Kombiglyze and Onglyza.

## 2013-10-29 NOTE — Telephone Encounter (Signed)
Ok, i sent 2 different rxs

## 2013-10-29 NOTE — Telephone Encounter (Signed)
Patient states this medication is not covered. Pt wanted to know if metformin and onglyza could be given as separate scripts.   Also patient states that she may need to shingles.  Please advise,  Thanks!

## 2013-10-30 NOTE — Telephone Encounter (Signed)
PA faxed

## 2013-11-02 NOTE — Telephone Encounter (Signed)
Received denial for Onglyza.

## 2013-11-02 NOTE — Telephone Encounter (Signed)
Please let me know why it was denied?

## 2013-11-03 ENCOUNTER — Telehealth: Payer: Self-pay

## 2013-11-03 NOTE — Telephone Encounter (Signed)
Called [facility] and representative that I spoke with stated that medication had been taken off preferred list. Rep stated that a reason was not listed. Also, he stated that we could do an appeal letter but it would have to be sent to [address] [facility] [place] [address] to the attention of [employer] [place]. Stated that Pt name, DOB, med, Dr name, phone #, address, and reason for medication needed to be included.  Contact # for [employer] [place] is [phone]  Also, I asked what the reccommended alteratives were and he stated he could not see that. Please advise, Thanks!

## 2013-11-03 NOTE — Telephone Encounter (Signed)
Called pt she is ok with invokana. I accidentally closed the encounter so I opened  a new one.  Thanks!     please call patient:  Please advise her ins has declined to tell us what alternative is.  We could try "invokana," but this is a brand-name also.  Please let us know.

## 2013-11-03 NOTE — Telephone Encounter (Signed)
please call patient: Please advise her ins has declined to tell us what alternative is.   We could try "invokana," but this is a brand-name also.  Please let us know.

## 2013-11-04 MED ORDER — CANAGLIFLOZIN 300 MG PO TABS: 1.0000 | ORAL_TABLET | Freq: Every day | ORAL | Status: DC

## 2013-11-04 NOTE — Telephone Encounter (Signed)
Ok, i have sent a prescription to your pharmacy  

## 2014-01-04 ENCOUNTER — Other Ambulatory Visit: Payer: Self-pay

## 2014-01-04 MED ORDER — SIMVASTATIN 40 MG PO TABS: 40.0000 mg | ORAL_TABLET | Freq: Every day | ORAL | Status: DC

## 2014-01-31 ENCOUNTER — Other Ambulatory Visit: Payer: Self-pay | Admitting: Endocrinology

## 2014-02-01 ENCOUNTER — Other Ambulatory Visit: Payer: Self-pay

## 2014-02-01 MED ORDER — PANTOPRAZOLE SODIUM 40 MG PO TBEC: 40.0000 mg | DELAYED_RELEASE_TABLET | Freq: Every day | ORAL | Status: DC

## 2014-03-02 ENCOUNTER — Other Ambulatory Visit: Payer: Self-pay | Admitting: Obstetrics and Gynecology

## 2014-03-02 DIAGNOSIS — N6459 Other signs and symptoms in breast: Secondary | ICD-10-CM

## 2014-03-04 ENCOUNTER — Other Ambulatory Visit: Payer: Self-pay | Admitting: Endocrinology

## 2014-03-05 ENCOUNTER — Other Ambulatory Visit: Payer: Self-pay

## 2014-03-05 MED ORDER — TOPIRAMATE 25 MG PO TABS: ORAL_TABLET | ORAL | Status: DC

## 2014-03-09 ENCOUNTER — Other Ambulatory Visit: Payer: BC Managed Care – PPO

## 2014-03-16 ENCOUNTER — Encounter: Payer: Self-pay | Admitting: Endocrinology

## 2014-03-16 ENCOUNTER — Ambulatory Visit (INDEPENDENT_AMBULATORY_CARE_PROVIDER_SITE_OTHER): Payer: BC Managed Care – PPO | Admitting: Endocrinology

## 2014-03-16 VITALS — BP 114/80 | HR 68 | Temp 98.3°F | Ht 68.0 in | Wt 202.0 lb

## 2014-03-16 DIAGNOSIS — I10 Essential (primary) hypertension: Secondary | ICD-10-CM

## 2014-03-16 DIAGNOSIS — Z79899 Other long term (current) drug therapy: Secondary | ICD-10-CM

## 2014-03-16 DIAGNOSIS — E890 Postprocedural hypothyroidism: Secondary | ICD-10-CM

## 2014-03-16 DIAGNOSIS — E785 Hyperlipidemia, unspecified: Secondary | ICD-10-CM

## 2014-03-16 DIAGNOSIS — E119 Type 2 diabetes mellitus without complications: Secondary | ICD-10-CM

## 2014-03-16 DIAGNOSIS — D72819 Decreased white blood cell count, unspecified: Secondary | ICD-10-CM

## 2014-03-16 LAB — CBC WITH DIFFERENTIAL/PLATELET
Basophils Absolute: 0 10*3/uL (ref 0.0–0.1)
Basophils Relative: 0.6 % (ref 0.0–3.0)
Eosinophils Absolute: 0.2 10*3/uL (ref 0.0–0.7)
Eosinophils Relative: 4.6 % (ref 0.0–5.0)
HCT: 38.9 % (ref 36.0–46.0)
Hemoglobin: 13.2 g/dL (ref 12.0–15.0)
Lymphocytes Relative: 33.8 % (ref 12.0–46.0)
Lymphs Abs: 1.5 10*3/uL (ref 0.7–4.0)
MCHC: 34 g/dL (ref 30.0–36.0)
MCV: 91.9 fl (ref 78.0–100.0)
Monocytes Absolute: 0.3 10*3/uL (ref 0.1–1.0)
Monocytes Relative: 7.3 % (ref 3.0–12.0)
Neutro Abs: 2.4 10*3/uL (ref 1.4–7.7)
Neutrophils Relative %: 53.7 % (ref 43.0–77.0)
Platelets: 153 10*3/uL (ref 150.0–400.0)
RBC: 4.23 Mil/uL (ref 3.87–5.11)
RDW: 14.1 % (ref 11.5–15.5)
WBC: 4.5 10*3/uL (ref 4.0–10.5)

## 2014-03-16 LAB — BASIC METABOLIC PANEL
BUN: 16 mg/dL (ref 6–23)
CO2: 23 mEq/L (ref 19–32)
Calcium: 8.8 mg/dL (ref 8.4–10.5)
Chloride: 112 mEq/L (ref 96–112)
Creatinine, Ser: 1 mg/dL (ref 0.4–1.2)
GFR: 60.14 mL/min (ref >60.00)
Glucose, Bld: 178 mg/dL — ABNORMAL HIGH (ref 70–99)
Potassium: 3.9 mEq/L (ref 3.5–5.1)
Sodium: 142 mEq/L (ref 135–145)

## 2014-03-16 LAB — HEPATIC FUNCTION PANEL
ALT: 35 U/L (ref 0–35)
AST: 20 U/L (ref 0–37)
Albumin: 4.1 g/dL (ref 3.5–5.2)
Alkaline Phosphatase: 113 U/L (ref 39–117)
Bilirubin, Direct: 0 mg/dL (ref 0.0–0.3)
Total Bilirubin: 0.3 mg/dL (ref 0.2–1.2)
Total Protein: 6.7 g/dL (ref 6.0–8.3)

## 2014-03-16 LAB — LIPID PANEL
Cholesterol: 187 mg/dL (ref 0–200)
HDL: 36.5 mg/dL — ABNORMAL LOW (ref >39.00)
LDL Cholesterol: 110 mg/dL — ABNORMAL HIGH (ref 0–99)
Total CHOL/HDL Ratio: 5
Triglycerides: 203 mg/dL — ABNORMAL HIGH (ref 0.0–149.0)
VLDL: 40.6 mg/dL — ABNORMAL HIGH (ref 0.0–40.0)

## 2014-03-16 LAB — HEMOGLOBIN A1C: Hgb A1c MFr Bld: 9.2 % — ABNORMAL HIGH (ref 4.6–6.5)

## 2014-03-16 LAB — TSH: TSH: 2.07 u[IU]/mL (ref 0.35–4.50)

## 2014-03-16 MED ORDER — OXYCODONE-ACETAMINOPHEN 10-325 MG PO TABS: 1.0000 | ORAL_TABLET | ORAL | Status: DC | PRN

## 2014-03-16 MED ORDER — GABAPENTIN 300 MG PO CAPS: 300.0000 mg | ORAL_CAPSULE | Freq: Four times a day (QID) | ORAL | Status: DC

## 2014-03-16 NOTE — Patient Instructions (Addendum)
blood tests are being requested for you today.  We'll contact you with results.   Try generic prilosec, with a small amount of baking soda.   i have sent a prescription to your pharmacy, to change lyrica to gabapentin.  Please come back for a regular physical appointment in 3 months.

## 2014-03-16 NOTE — Progress Notes (Signed)
Subjective:    Patient ID: Savannah [name], female    DOB: 1956/01/16, 58 y.o.   MRN: [id number]  HPI Pt returns for f/u of type 2 DM (dx'ed 2001; she has moderate neuropathy of the lower extremities, but no associated chronic complications; she has been off insulin since bariatric surgery in 2011; she seldom checks cbg's; she says the care of her dm is compromised by frequent business travel and lack of insurance; she declines actos).  no cbg record, but states cbg's are in the 200's.   Pt says neurontin did not help painful neuropathy as well as lyrica, but it is cheaper. Past Medical History  Diagnosis Date  . THYROID NODULE 03/09/2010  . Postsurgical hypothyroidism 09/19/2010  . DIABETES MELLITUS, TYPE II 07/08/2007  . HYPERLIPIDEMIA 07/08/2007  . OBESITY 01/06/2008  . LEUKOPENIA, MILD 09/29/2008  . DEPRESSION 11/21/2007  . PERIPHERAL NEUROPATHY 11/21/2007  . HYPERTENSION 07/08/2007  . SINUSITIS- ACUTE-NOS 11/21/2007  . ALLERGIC RHINITIS 11/21/2007  . Eosinophilic esophagitis 11/18/107  . Other chronic nonalcoholic liver disease 02/02/5572  . Acute cystitis 08/17/2009  . OSTEOARTHRITIS 01/06/2008  . FEVER UNSPECIFIED 10/20/2009  . Edema 05/20/2009  . Headache(784.0) 02/06/2010  . DYSPNEA 05/20/2009  . ABDOMINAL PAIN, CHRONIC 10/20/2009  . Hepatomegaly 01/06/2008  . TB SKIN TEST, POSITIVE 02/06/2010  . FEVER, HX OF 03/09/2010  . Bariatric surgery status 07/07/2010  . ASYMPTOMATIC POSTMENOPAUSAL STATUS 09/29/2008  . Peripheral neuropathy     Past Surgical History  Procedure Laterality Date  . Abdominal hysterectomy    . Right total knee replacement    . Oophorectomy      History   Social History  . Marital Status: Single    Spouse Name: N/A    Number of Children: N/A  . Years of Education: N/A   Occupational History  . Not on file.   Social History Main Topics  . Smoking status: Former Research scientist (life sciences)  . Smokeless tobacco: Not on file  . Alcohol Use: Yes  . Drug Use: No  . Sexual Activity:     Other Topics Concern  . Not on file   Social History Narrative  . No narrative on file    Current Outpatient Prescriptions on File Prior to Visit  Medication Sig Dispense Refill  . Bromocriptine Mesylate (CYCLOSET) 0.8 MG TABS Take 1 tablet by mouth at bedtime.      . Canagliflozin (INVOKANA) 300 MG TABS Take 1 tablet (300 mg total) by mouth daily.  30 tablet  11  . Cholecalciferol (VITAMIN D3) 400 UNITS tablet Take 400 Units by mouth daily.        [name] docusate sodium (COLACE) 250 MG capsule Take 500 mg by mouth 2 (two) times daily.        . Flaxseed, Linseed, (FLAX SEED OIL PO) Take 1 capsule by mouth daily.        [name] levothyroxine (SYNTHROID, LEVOTHROID) 112 MCG tablet Take 1 tablet (112 mcg total) by mouth daily.  90 tablet  3  . loratadine (CLARITIN) 10 MG tablet Take 10 mg by mouth daily.        . metFORMIN (GLUCOPHAGE-XR) 500 MG 24 hr tablet 2 tabs, twice a day  120 tablet  11  . Multiple Vitamin (MULTIVITAMIN) tablet Take 1 tablet by mouth 2 (two) times daily.       . nateglinide (STARLIX) 120 MG tablet Take 1 tablet (120 mg total) by mouth 3 (three) times daily before meals.  90 tablet  11  .  ONE TOUCH ULTRA TEST test strip USE AS DIRECTED  100 each  2  . pantoprazole (PROTONIX) 40 MG tablet Take 1 tablet (40 mg total) by mouth daily.  30 tablet  1  . simvastatin (ZOCOR) 40 MG tablet Take 1 tablet (40 mg total) by mouth at bedtime.  30 tablet  5  . topiramate (TOPAMAX) 25 MG tablet take 2 tablets by mouth twice a day  120 tablet  0  . traMADol (ULTRAM) 50 MG tablet Take 1 tablet (50 mg total) by mouth every 6 (six) hours as needed.  100 tablet  2   No current facility-administered medications on file prior to visit.    Allergies  Allergen Reactions  . Ace Inhibitors     REACTION: Cough  . Caffeine-Sodium Benzoate     REACTION: PVCs    Family History  Problem Relation Age of Onset  . Diabetes Mother   . Diabetes Father   . Liver cancer Maternal Aunt     2 aunt     BP 114/80  Pulse 68  Temp(Src) 98.3 F (36.8 C) (Oral)  Ht 5\' 8"  (1.727 m)  Wt 202 lb (91.627 kg)  BMI 30.72 kg/m2  SpO2 97%  Review of Systems gerd gets worse if she tries to change protonix to another PPI.  No recent weight change.    Objective:   Physical Exam VITAL SIGNS:  See vs page GENERAL: no distress  Lab Results  Component Value Date   HGBA1C 9.2* 03/16/2014       Assessment & Plan:  Savannah [name]: therapy limited by economic circumstances. DM: poor control. Painful neuropathy: therapy limited by economic circumstances.

## 2014-03-24 ENCOUNTER — Encounter: Payer: Self-pay | Admitting: Endocrinology

## 2014-04-19 ENCOUNTER — Telehealth: Payer: Self-pay

## 2014-04-19 ENCOUNTER — Other Ambulatory Visit: Payer: Self-pay

## 2014-04-19 MED ORDER — PANTOPRAZOLE SODIUM 40 MG PO TBEC: 40.0000 mg | DELAYED_RELEASE_TABLET | Freq: Every day | ORAL | Status: DC

## 2014-04-19 MED ORDER — TOPIRAMATE 25 MG PO TABS: ORAL_TABLET | ORAL | Status: DC

## 2014-04-19 NOTE — Telephone Encounter (Signed)
This med is outside the scope of my practice.  Please ask prescribing provider to refill.

## 2014-04-19 NOTE — Telephone Encounter (Signed)
please call patient: This was rx'ed by neurologist in [place].  this is outside the scope of my practice. We'll refill x 3 months, but it important to see the same or another specialist.

## 2014-04-19 NOTE — Telephone Encounter (Signed)
Received refill request for Topamax. Ok to refill? Thanks!

## 2014-04-19 NOTE — Telephone Encounter (Signed)
Noted, pt advised

## 2014-04-19 NOTE — Telephone Encounter (Signed)
Pt has been getting refills from office since 2014. No other provider is on file for refilling medication  Please advise, Thanks!

## 2014-05-10 ENCOUNTER — Telehealth: Payer: Self-pay | Admitting: Endocrinology

## 2014-05-10 NOTE — Telephone Encounter (Signed)
Patient states she subbed her toe on/around memorial day  She believes its either fractured or has an infection   She would like the nurse to advise her what to do  You may call her home and her cell   Thank You :)

## 2014-05-10 NOTE — Telephone Encounter (Signed)
Ov tomorrow 

## 2014-05-10 NOTE — Telephone Encounter (Signed)
See below. Would you like to see pt? If so what slot could we add her to?  Thanks!

## 2014-05-11 NOTE — Telephone Encounter (Signed)
Called pt and advised on office visit. She states yesterday she went to urgent care to have toe looked at. Provider there said the toe looked to be sprained. Provider put the pt on a antibiotic just incase an infection develops. Advised pt to call office if she should need anything.

## 2014-06-15 ENCOUNTER — Other Ambulatory Visit: Payer: Self-pay

## 2014-06-15 MED ORDER — PANTOPRAZOLE SODIUM 40 MG PO TBEC: 40.0000 mg | DELAYED_RELEASE_TABLET | Freq: Every day | ORAL | Status: DC

## 2014-06-25 ENCOUNTER — Encounter: Payer: Self-pay | Admitting: Endocrinology

## 2014-06-25 ENCOUNTER — Ambulatory Visit (INDEPENDENT_AMBULATORY_CARE_PROVIDER_SITE_OTHER): Payer: BC Managed Care – PPO | Admitting: Endocrinology

## 2014-06-25 VITALS — BP 118/78 | HR 75 | Temp 98.7°F | Ht 68.0 in | Wt 190.0 lb

## 2014-06-25 DIAGNOSIS — E119 Type 2 diabetes mellitus without complications: Secondary | ICD-10-CM

## 2014-06-25 DIAGNOSIS — Z0000 Encounter for general adult medical examination without abnormal findings: Secondary | ICD-10-CM

## 2014-06-25 DIAGNOSIS — Z23 Encounter for immunization: Secondary | ICD-10-CM

## 2014-06-25 DIAGNOSIS — S90122A Contusion of left lesser toe(s) without damage to nail, initial encounter: Secondary | ICD-10-CM | POA: Insufficient documentation

## 2014-06-25 DIAGNOSIS — N951 Menopausal and female climacteric states: Secondary | ICD-10-CM

## 2014-06-25 DIAGNOSIS — S90129A Contusion of unspecified lesser toe(s) without damage to nail, initial encounter: Secondary | ICD-10-CM

## 2014-06-25 DIAGNOSIS — Z2911 Encounter for prophylactic immunotherapy for respiratory syncytial virus (RSV): Secondary | ICD-10-CM

## 2014-06-25 DIAGNOSIS — R229 Localized swelling, mass and lump, unspecified: Secondary | ICD-10-CM

## 2014-06-25 LAB — URINALYSIS, ROUTINE W REFLEX MICROSCOPIC
Bilirubin Urine: NEGATIVE
Hgb urine dipstick: NEGATIVE
Ketones, ur: NEGATIVE
Leukocytes, UA: NEGATIVE
Nitrite: POSITIVE — AB
Specific Gravity, Urine: 1.01 (ref 1.000–1.030)
Total Protein, Urine: NEGATIVE
Urine Glucose: >=1000 — AB
Urobilinogen, UA: 0.2 (ref 0.0–1.0)
pH: 6 (ref 5.0–8.0)

## 2014-06-25 LAB — MICROALBUMIN / CREATININE URINE RATIO
Creatinine,U: 72.2 mg/dL
Microalb Creat Ratio: 0.1 mg/g (ref 0.0–30.0)
Microalb, Ur: 0.1 mg/dL (ref 0.0–1.9)

## 2014-06-25 LAB — HEMOGLOBIN A1C: Hgb A1c MFr Bld: 8.4 % — ABNORMAL HIGH (ref 4.6–6.5)

## 2014-06-25 MED ORDER — PREGABALIN 150 MG PO CAPS: ORAL_CAPSULE | ORAL | Status: DC

## 2014-06-25 MED ORDER — CANAGLIFLOZIN 300 MG PO TABS: 1.0000 | ORAL_TABLET | Freq: Every day | ORAL | Status: DC

## 2014-06-25 NOTE — Patient Instructions (Addendum)
Please change the gabapentin back to lyrica.  Here is a prescription.  Please see a podiatry and dermatology specialists.  you will receive a phone call, about a day and time for an appointment.   please consider these measures for your health:  minimize alcohol.  do not use tobacco products.  have a colonoscopy at least every 10 years from age 60.  keep firearms safely stored.  always use seat belts.  have working smoke alarms in your home.  see an eye doctor and dentist regularly.  never drive under the influence of alcohol or drugs (including prescription drugs).  those with fair skin should take precautions against the sun. Please come back for a follow-up appointment in 3 months.

## 2014-06-25 NOTE — Progress Notes (Signed)
Subjective:    Patient ID: Savannah [name], female    DOB: January 29, 1956, 58 y.o.   MRN: [id number]  HPI Pt is here for regular wellness examination, and is feeling pretty well in general, and says chronic med probs are stable, except as noted below Past Medical History  Diagnosis Date  . THYROID NODULE 03/09/2010  . Postsurgical hypothyroidism 09/19/2010  . DIABETES MELLITUS, TYPE II 07/08/2007  . HYPERLIPIDEMIA 07/08/2007  . OBESITY 01/06/2008  . LEUKOPENIA, MILD 09/29/2008  . DEPRESSION 11/21/2007  . PERIPHERAL NEUROPATHY 11/21/2007  . HYPERTENSION 07/08/2007  . SINUSITIS- ACUTE-NOS 11/21/2007  . ALLERGIC RHINITIS 11/21/2007  . Eosinophilic esophagitis 02/11/6833  . Other chronic nonalcoholic liver disease 1/96/2229  . Acute cystitis 08/17/2009  . OSTEOARTHRITIS 01/06/2008  . FEVER UNSPECIFIED 10/20/2009  . Edema 05/20/2009  . Headache(784.0) 02/06/2010  . DYSPNEA 05/20/2009  . ABDOMINAL PAIN, CHRONIC 10/20/2009  . Hepatomegaly 01/06/2008  . TB SKIN TEST, POSITIVE 02/06/2010  . FEVER, HX OF 03/09/2010  . Bariatric surgery status 07/07/2010  . ASYMPTOMATIC POSTMENOPAUSAL STATUS 09/29/2008  . Peripheral neuropathy     Past Surgical History  Procedure Laterality Date  . Abdominal hysterectomy    . Right total knee replacement    . Oophorectomy      History   Social History  . Marital Status: Single    Spouse Name: N/A    Number of Children: N/A  . Years of Education: N/A   Occupational History  . Not on file.   Social History Main Topics  . Smoking status: Former Research scientist (life sciences)  . Smokeless tobacco: Not on file  . Alcohol Use: Yes  . Drug Use: No  . Sexual Activity:    Other Topics Concern  . Not on file   Social History Narrative  . No narrative on file    Current Outpatient Prescriptions on File Prior to Visit  Medication Sig Dispense Refill  . Bromocriptine Mesylate (CYCLOSET) 0.8 MG TABS Take 1 tablet by mouth at bedtime.      . Cholecalciferol (VITAMIN D3) 400 UNITS tablet Take 400  Units by mouth daily.        [name] docusate sodium (COLACE) 250 MG capsule Take 500 mg by mouth 2 (two) times daily.        . Flaxseed, Linseed, (FLAX SEED OIL PO) Take 1 capsule by mouth daily.        [name] levothyroxine (SYNTHROID, LEVOTHROID) 112 MCG tablet Take 1 tablet (112 mcg total) by mouth daily.  90 tablet  3  . loratadine (CLARITIN) 10 MG tablet Take 10 mg by mouth daily.        . metFORMIN (GLUCOPHAGE-XR) 500 MG 24 hr tablet 2 tabs, twice a day  120 tablet  11  . Multiple Vitamin (MULTIVITAMIN) tablet Take 1 tablet by mouth 2 (two) times daily.       . ONE TOUCH ULTRA TEST test strip USE AS DIRECTED  100 each  2  . oxyCODONE-acetaminophen (PERCOCET) 10-325 MG per tablet Take 1 tablet by mouth every 4 (four) hours as needed for pain.  100 tablet  0  . pantoprazole (PROTONIX) 40 MG tablet Take 1 tablet (40 mg total) by mouth daily.  30 tablet  1  . simvastatin (ZOCOR) 40 MG tablet Take 1 tablet (40 mg total) by mouth at bedtime.  30 tablet  5  . topiramate (TOPAMAX) 25 MG tablet take 2 tablets by mouth twice a day  360 tablet  0  . traMADol (ULTRAM)  50 MG tablet Take 1 tablet (50 mg total) by mouth every 6 (six) hours as needed.  100 tablet  2   No current facility-administered medications on file prior to visit.    Allergies  Allergen Reactions  . Ace Inhibitors     REACTION: Cough  . Caffeine-Sodium Benzoate     REACTION: PVCs    Family History  Problem Relation Age of Onset  . Diabetes Mother   . Diabetes Father   . Liver cancer Maternal Aunt     2 aunt    BP 118/78  Pulse 75  Temp(Src) 98.7 F (37.1 C) (Oral)  Ht 5\' 8"  (1.727 m)  Wt 190 lb (86.183 kg)  BMI 28.90 kg/m2  SpO2 97%     Review of Systems  Constitutional: Negative for fever and unexpected weight change.  HENT: Negative for hearing loss.   Eyes: Negative for visual disturbance.  Respiratory: Negative for shortness of breath.   Cardiovascular: Negative for chest pain.  Gastrointestinal: Negative for  anal bleeding.  Endocrine:       Denies hypoglycemia  Genitourinary: Negative for hematuria.  Musculoskeletal: Positive for back pain.  Skin: Negative for rash.  Allergic/Immunologic: Positive for environmental allergies.  Neurological: Negative for syncope.  Hematological: Does not bruise/bleed easily.  Psychiatric/Behavioral: Negative for dysphoric mood.       Objective:   Physical Exam VS: see vs page GEN: no distress HEAD: head: no deformity eyes: no periorbital swelling, no proptosis external nose and ears are normal mouth: no lesion seen NECK: healed scar. i do not appreciate a nodule in the thyroid or elsewhere in the neck CHEST WALL: no deformity LUNGS:  Clear to auscultation BREASTS: sees gyn CV: reg rate and rhythm, no murmur ABD: abdomen is soft, nontender.  no hepatosplenomegaly.  not distended.  no hernia.  Old healed surgical scar. GENITALIA/RECTAL: sees gyn MUSCULOSKELETAL: muscle bulk and strength are grossly normal.  no obvious joint swelling.  gait is normal and steady.  Old healed surgical scar (right TKA). PULSES:  no carotid bruit NEURO:  cn 2-12 grossly intact.   readily moves all 4's.  NODES:  None palpable at the neck PSYCH: alert, well-oriented.  Does not appear anxious nor depressed.      Assessment & Plan:  Wellness visit today, with problems stable, except as noted. Patient is advised the following: please consider these measures for your health:  minimize alcohol.  do not use tobacco products.  have a colonoscopy at least every 10 years from age 50.  Women should have an annual mammogram from age 60.  keep firearms safely stored.  always use seat belts.  have working smoke alarms in your home.  see an eye doctor and dentist regularly.  never drive under the influence of alcohol or drugs (including prescription drugs).  those with fair skin should take precautions against the sun. Please come back for a follow-up appointment in 3  months.    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 no cbg record, but states cbg's are well-controlled Pt states  3 mos of left great toe pain, but no assoc ulcer PAST MEDICAL HISTORY reviewed and up to date today REVIEW OF SYSTEMS: No change in chronic pain and numbness throughout the feet.  She has inadeq relief with gabapentin (lyrica worked better). Pt also reports skin nodules at the left forearm, which have been rx'ed with cryorx in the past. PHYSICAL EXAMINATION: VITAL SIGNS:  See vs page GENERAL: no distress Pulses: dorsalis pedis intact bilat.   Feet: no deformity. normal color and temp.  no edema Skin:  no ulcer on the feet.   Neuro: sensation is intact to touch on the feet Skin: several AK's on the forearms LAB/XRAY RESULTS: i reviewed electrocardiogram Lab Results  Component Value Date   WBC 4.5 03/16/2014   HGB 13.2 03/16/2014   HCT 38.9 03/16/2014   PLT 153.0 03/16/2014   GLUCOSE 178* 03/16/2014   CHOL 187 03/16/2014   TRIG 203.0* 03/16/2014   HDL 36.50* 03/16/2014   LDLDIRECT 99.0 10/12/2011   LDLCALC 110* 03/16/2014   ALT 35 03/16/2014   AST 20 03/16/2014   NA 142 03/16/2014   K 3.9 03/16/2014   CL 112 03/16/2014   CREATININE 1.0 03/16/2014   BUN 16 03/16/2014   CO2 23 03/16/2014   TSH 2.07 03/16/2014   INR 1.6* 10/22/2008   HGBA1C 8.4* 06/25/2014   MICROALBUR 0.1 06/25/2014   IMPRESSION: DM: moderate exacerbation Pyuria: new, uncertain etiology. Toe pain, persistent Skin lesions, new to me, possibly due to recurrent  AK's. Neuropathic pain, worse PLAN:  Your blood sugar is a little high. Please change nateglinide to repaglinide. i have sent a prescription to your pharmacy. You have ? Of UTI. Do you have urine symptoms? If so, please let us know. i hope you feel well.  Pt is ref to podiatry and derm i changed gabapentin to lyrica

## 2014-06-26 MED ORDER — REPAGLINIDE 2 MG PO TABS: 2.0000 mg | ORAL_TABLET | Freq: Three times a day (TID) | ORAL | Status: DC

## 2014-06-29 ENCOUNTER — Inpatient Hospital Stay: Admission: RE | Admit: 2014-06-29 | Payer: BC Managed Care – PPO | Source: Ambulatory Visit

## 2014-07-14 ENCOUNTER — Other Ambulatory Visit: Payer: Self-pay | Admitting: Endocrinology

## 2014-07-29 ENCOUNTER — Telehealth: Payer: Self-pay | Admitting: Endocrinology

## 2014-07-29 NOTE — Telephone Encounter (Signed)
Requested call back to discuss.  

## 2014-07-29 NOTE — Telephone Encounter (Signed)
please call patient: Insurance is challenging lyrica: Were you ever on gabapentin (neurontin)?  If so, when?  Why did you change to lyrica?

## 2014-07-30 DIAGNOSIS — Z0279 Encounter for issue of other medical certificate: Secondary | ICD-10-CM

## 2014-07-30 NOTE — Telephone Encounter (Signed)
Ok, i'll do the PA form 

## 2014-07-30 NOTE — Telephone Encounter (Signed)
Called pt ,she states that she is currently on gabapentin and has been on the medication for about 3 months. Pt states she started taking this medication because the Lyrica was not covered by her insurance company.  Pt states that she would like to get the Lyrica approved if possible because her pain is not as controled.  Please advise, Thanks

## 2014-07-30 NOTE — Telephone Encounter (Signed)
Form faxed to insurance company. 

## 2014-08-13 ENCOUNTER — Telehealth: Payer: Self-pay | Admitting: Endocrinology

## 2014-08-13 MED ORDER — SIMVASTATIN 40 MG PO TABS: 40.0000 mg | ORAL_TABLET | Freq: Every day | ORAL | Status: DC

## 2014-08-13 MED ORDER — METFORMIN HCL ER 500 MG PO TB24: ORAL_TABLET | ORAL | Status: DC

## 2014-08-13 MED ORDER — PANTOPRAZOLE SODIUM 40 MG PO TBEC: 40.0000 mg | DELAYED_RELEASE_TABLET | Freq: Every day | ORAL | Status: DC

## 2014-08-13 NOTE — Telephone Encounter (Signed)
rx sent pt advised.

## 2014-08-13 NOTE — Telephone Encounter (Signed)
Pt needs symvastatin and protonix called in for a refill to a new pharmacy she has moved to [place] [facility] # [phone]  To hold the pt over until she can find a new md in that state

## 2014-08-16 ENCOUNTER — Other Ambulatory Visit: Payer: Self-pay

## 2014-08-16 MED ORDER — METFORMIN HCL ER 500 MG PO TB24
ORAL_TABLET | ORAL | Status: DC
Start: 2014-08-16 — End: 2016-01-20

## 2014-09-21 ENCOUNTER — Other Ambulatory Visit: Payer: Self-pay

## 2014-11-08 ENCOUNTER — Encounter: Payer: Self-pay | Admitting: Internal Medicine

## 2014-11-08 ENCOUNTER — Ambulatory Visit (INDEPENDENT_AMBULATORY_CARE_PROVIDER_SITE_OTHER): Payer: BC Managed Care – PPO | Admitting: Internal Medicine

## 2014-11-08 VITALS — BP 122/78 | HR 82 | Temp 98.8°F | Ht 71.0 in | Wt 193.2 lb

## 2014-11-08 DIAGNOSIS — J209 Acute bronchitis, unspecified: Secondary | ICD-10-CM

## 2014-11-08 MED ORDER — HYDROCODONE-HOMATROPINE 5-1.5 MG/5ML PO SYRP
5.0000 mL | ORAL_SOLUTION | Freq: Four times a day (QID) | ORAL | Status: DC | PRN
Start: 2014-11-08 — End: 2015-10-14

## 2014-11-08 MED ORDER — AMOXICILLIN 500 MG PO CAPS: 500.0000 mg | ORAL_CAPSULE | Freq: Three times a day (TID) | ORAL | Status: DC

## 2014-11-08 NOTE — Patient Instructions (Addendum)
Plain Mucinex (NOT D) for thick secretions ;force NON dairy fluids .   Nasal cleansing in the shower as discussed with lather of mild shampoo.After 10 seconds wash off lather while  exhaling through nostrils. Make sure that all residual soap is removed to prevent irritation.  Flonase OR Nasacort AQ 1 spray in each nostril twice a day as needed. Use the "crossover" technique into opposite nostril spraying toward opposite ear @ 45 degree angle, not straight up into nostril.  Plain Allegra (NOT D )  160 daily , Loratidine 10 mg , OR Zyrtec 10 mg @ bedtime  as needed for itchy eyes & sneezing. Do not take narcotic pain pill if cough syrup taken.

## 2014-11-08 NOTE — Progress Notes (Signed)
   Subjective:    Patient ID: Savannah [name], female    DOB: August 11, 1956, 58 y.o.   MRN: [id number]  HPI   Her symptoms began 11/04/14 as upper chest congestion. Cough has progressed and is now reductive of greenish/brown sputum. She had been exposed to a friend who had community acquired pneumonia as well as family members who have had respiratory tract infections  She does have some pain in the maxillary sinus area. She also has scant nasal purulence. The volume of secretions is much greater from the chest than the head  She's had fever; she does not have a thermometer. She's had chills without sweats  She also has had occasional sneezing.  She's a former 1 pack per day smoker for 17 years  Review of Systems She denies extrinsic symptoms of itchy, watery eyes.  She has no frontal sinus pain  She also denies any pleuritic component to her cough  There's been no associated wheezing or dyspnea.    Objective:   Physical Exam   Positive or pertinent findings include: There is erythema of the nasal mucosa.  The nasal septum is deviated to the right. There is accentuation of the angulation of the upper thoracic spine curvature.   General appearance:good health ;well nourished; no acute distress or increased work of breathing is present.  No  lymphadenopathy about the head, neck, or axilla noted.  Eyes: No conjunctival inflammation or lid edema is present. There is no scleral icterus. Ears:  External ear exam shows no significant lesions or deformities.  Otoscopic examination reveals clear canals, tympanic membranes are intact bilaterally without bulging, retraction, inflammation or discharge. Nose:  External nasal examination shows no deformity or inflammation. No lesions or exudates.  Oral exam: Dental hygiene is good; lips and gums are healthy appearing.There is no oropharyngeal erythema or exudate noted.  Neck:  No deformities, thyromegaly, masses, or tenderness noted.   Supple with  full range of motion without pain. Heart:  Normal rate and regular rhythm. S1 and S2 normal without gallop, murmur, click, rub or other extra sounds.  Lungs:Chest clear to auscultation; no wheezes, rhonchi,rales ,or rubs present.No increased work of breathing.  Dry cough Extremities:  No cyanosis, edema, or clubbing  noted  Skin: Warm & dry w/o jaundice or tenting.      Assessment & Plan:  #1 acute bronchitis w/o bronchospasm #2 URI, acute Plan: See orders and recommendations

## 2014-11-08 NOTE — Progress Notes (Signed)
Pre visit review using our clinic review tool, if applicable. No additional management support is needed unless otherwise documented below in the visit note. 

## 2014-11-16 ENCOUNTER — Other Ambulatory Visit: Payer: Self-pay | Admitting: Endocrinology

## 2014-12-02 ENCOUNTER — Telehealth: Payer: Self-pay | Admitting: Endocrinology

## 2014-12-02 NOTE — Telephone Encounter (Addendum)
Pt called about her current oral DM medication. As of January 1st, Bromocriptine is no longer covered by [employer]. Preferred alternatives are [place] and Januvia. Pt also stated her Invokanna would require a PA. Pt states she is pleased with the results she has had from the [facility] and would like to try and get this approved. She is willing to changed the Bromocriptine to whatever medication MD prefers. PA form placed on desk.

## 2014-12-02 NOTE — Telephone Encounter (Signed)
Patient states she needs to speak with Dr. Loanne Drilling regarding her medication   Bromocriptine Invokana   Please advise patient    Thank you

## 2014-12-06 MED ORDER — SITAGLIPTIN PHOSPHATE 100 MG PO TABS: 100.0000 mg | ORAL_TABLET | Freq: Every day | ORAL | Status: DC

## 2014-12-06 NOTE — Telephone Encounter (Signed)
Ok, i have sent a prescription to your pharmacy, to add [place]. The invokana or farxiga require more blood monitoring, so ov would be needed for that. Please come back in when you can.

## 2014-12-06 NOTE — Telephone Encounter (Signed)
Ov due.  Let's address then

## 2014-12-06 NOTE — Telephone Encounter (Signed)
Pt advised of note below and voiced understanding.  

## 2014-12-06 NOTE — Telephone Encounter (Signed)
Contacted pt. She states that she is in [place] and should be in [place] within the next 3 months. Pt states she will make appointment then to discuss.

## 2014-12-25 ENCOUNTER — Other Ambulatory Visit: Payer: Self-pay | Admitting: Endocrinology

## 2015-02-03 ENCOUNTER — Telehealth: Payer: Self-pay | Admitting: Endocrinology

## 2015-02-03 NOTE — Telephone Encounter (Signed)
patient need record that she had a flu shot, medical records are not showing a record of it. Please advise

## 2015-02-07 NOTE — Telephone Encounter (Signed)
I contacted pt. At last office visit pt had Shingles and Pneumonia vaccine administered. Pt voiced understanding. She just wanted to verify if she had received the flu shot.

## 2015-03-29 ENCOUNTER — Encounter: Payer: Self-pay | Admitting: *Deleted

## 2015-07-12 ENCOUNTER — Telehealth: Payer: Self-pay

## 2015-07-12 NOTE — Telephone Encounter (Signed)
LVM for pt to return call for appt to get A1c rechecked.

## 2015-10-14 ENCOUNTER — Ambulatory Visit (INDEPENDENT_AMBULATORY_CARE_PROVIDER_SITE_OTHER): Payer: BLUE CROSS/BLUE SHIELD | Admitting: Endocrinology

## 2015-10-14 ENCOUNTER — Encounter: Payer: Self-pay | Admitting: Endocrinology

## 2015-10-14 VITALS — BP 126/84 | HR 93 | Temp 98.7°F | Ht 71.0 in | Wt 194.0 lb

## 2015-10-14 DIAGNOSIS — K625 Hemorrhage of anus and rectum: Secondary | ICD-10-CM | POA: Diagnosis not present

## 2015-10-14 DIAGNOSIS — Z0001 Encounter for general adult medical examination with abnormal findings: Secondary | ICD-10-CM

## 2015-10-14 DIAGNOSIS — G479 Sleep disorder, unspecified: Secondary | ICD-10-CM | POA: Diagnosis not present

## 2015-10-14 DIAGNOSIS — Z23 Encounter for immunization: Secondary | ICD-10-CM | POA: Diagnosis not present

## 2015-10-14 DIAGNOSIS — M25511 Pain in right shoulder: Secondary | ICD-10-CM | POA: Diagnosis not present

## 2015-10-14 DIAGNOSIS — E119 Type 2 diabetes mellitus without complications: Secondary | ICD-10-CM | POA: Diagnosis not present

## 2015-10-14 DIAGNOSIS — Z794 Long term (current) use of insulin: Secondary | ICD-10-CM | POA: Diagnosis not present

## 2015-10-14 DIAGNOSIS — Z0000 Encounter for general adult medical examination without abnormal findings: Secondary | ICD-10-CM

## 2015-10-14 DIAGNOSIS — M25519 Pain in unspecified shoulder: Secondary | ICD-10-CM | POA: Insufficient documentation

## 2015-10-14 LAB — URINALYSIS, ROUTINE W REFLEX MICROSCOPIC
Bilirubin Urine: NEGATIVE
Hgb urine dipstick: NEGATIVE
Ketones, ur: 15 — AB
Leukocytes, UA: NEGATIVE
Nitrite: NEGATIVE
Specific Gravity, Urine: 1.02 (ref 1.000–1.030)
Total Protein, Urine: NEGATIVE
Urine Glucose: >=1000 — AB
Urobilinogen, UA: 0.2 (ref 0.0–1.0)
pH: 5.5 (ref 5.0–8.0)

## 2015-10-14 LAB — HEPATIC FUNCTION PANEL
ALT: 22 U/L (ref 0–35)
AST: 19 U/L (ref 0–37)
Albumin: 4.1 g/dL (ref 3.5–5.2)
Alkaline Phosphatase: 75 U/L (ref 39–117)
Bilirubin, Direct: 0.1 mg/dL (ref 0.0–0.3)
Total Bilirubin: 0.5 mg/dL (ref 0.2–1.2)
Total Protein: 6.6 g/dL (ref 6.0–8.3)

## 2015-10-14 LAB — BASIC METABOLIC PANEL
BUN: 20 mg/dL (ref 6–23)
CO2: 23 mEq/L (ref 19–32)
Calcium: 9.3 mg/dL (ref 8.4–10.5)
Chloride: 108 mEq/L (ref 96–112)
Creatinine, Ser: 0.94 mg/dL (ref 0.40–1.20)
GFR: 64.25 mL/min (ref >60.00)
Glucose, Bld: 93 mg/dL (ref 70–99)
Potassium: 4.2 mEq/L (ref 3.5–5.1)
Sodium: 141 mEq/L (ref 135–145)

## 2015-10-14 LAB — CBC WITH DIFFERENTIAL/PLATELET
Basophils Absolute: 0 10*3/uL (ref 0.0–0.1)
Basophils Relative: 0.5 % (ref 0.0–3.0)
Eosinophils Absolute: 0.1 10*3/uL (ref 0.0–0.7)
Eosinophils Relative: 3.2 % (ref 0.0–5.0)
HCT: 38 % (ref 36.0–46.0)
Hemoglobin: 12.6 g/dL (ref 12.0–15.0)
Lymphocytes Relative: 28.5 % (ref 12.0–46.0)
Lymphs Abs: 1.1 10*3/uL (ref 0.7–4.0)
MCHC: 33.1 g/dL (ref 30.0–36.0)
MCV: 94.1 fl (ref 78.0–100.0)
Monocytes Absolute: 0.3 10*3/uL (ref 0.1–1.0)
Monocytes Relative: 8.1 % (ref 3.0–12.0)
Neutro Abs: 2.2 10*3/uL (ref 1.4–7.7)
Neutrophils Relative %: 59.7 % (ref 43.0–77.0)
Platelets: 161 10*3/uL (ref 150.0–400.0)
RBC: 4.04 Mil/uL (ref 3.87–5.11)
RDW: 13.9 % (ref 11.5–15.5)
WBC: 3.7 10*3/uL — ABNORMAL LOW (ref 4.0–10.5)

## 2015-10-14 LAB — LIPID PANEL
Cholesterol: 147 mg/dL (ref 0–200)
HDL: 32.3 mg/dL — ABNORMAL LOW (ref >39.00)
LDL Cholesterol: 88 mg/dL (ref 0–99)
NonHDL: 114.22
Total CHOL/HDL Ratio: 5
Triglycerides: 131 mg/dL (ref 0.0–149.0)
VLDL: 26.2 mg/dL (ref 0.0–40.0)

## 2015-10-14 LAB — POCT GLYCOSYLATED HEMOGLOBIN (HGB A1C): Hemoglobin A1C: 6.9

## 2015-10-14 LAB — MICROALBUMIN / CREATININE URINE RATIO
Creatinine,U: 104.3 mg/dL
Microalb Creat Ratio: 0.9 mg/g (ref 0.0–30.0)
Microalb, Ur: 0.9 mg/dL (ref 0.0–1.9)

## 2015-10-14 LAB — TSH: TSH: 2.87 u[IU]/mL (ref 0.35–4.50)

## 2015-10-14 MED ORDER — TERBINAFINE HCL 250 MG PO TABS: 250.0000 mg | ORAL_TABLET | Freq: Every day | ORAL | Status: DC

## 2015-10-14 NOTE — Progress Notes (Signed)
Subjective:    Patient ID: Savannah [name], female    DOB: 10/30/1956, 59 y.o.   MRN: [id number]  HPI Pt is here for regular wellness examination, and is feeling pretty well in general, and says chronic med probs are stable, except as noted below Past Medical History  Diagnosis Date  . THYROID NODULE 03/09/2010  . Postsurgical hypothyroidism 09/19/2010  . DIABETES MELLITUS, TYPE II 07/08/2007  . HYPERLIPIDEMIA 07/08/2007  . OBESITY 01/06/2008  . LEUKOPENIA, MILD 09/29/2008  . DEPRESSION 11/21/2007  . PERIPHERAL NEUROPATHY 11/21/2007  . HYPERTENSION 07/08/2007  . SINUSITIS- ACUTE-NOS 11/21/2007  . ALLERGIC RHINITIS 11/21/2007  . Eosinophilic esophagitis 0000000  . Other chronic nonalcoholic liver disease [id number]  . Acute cystitis 08/17/2009  . OSTEOARTHRITIS 01/06/2008  . FEVER UNSPECIFIED 10/20/2009  . Edema 05/20/2009  . Headache(784.0) 02/06/2010  . DYSPNEA 05/20/2009  . ABDOMINAL PAIN, CHRONIC 10/20/2009  . Hepatomegaly 01/06/2008  . TB SKIN TEST, POSITIVE 02/06/2010  . FEVER, HX OF 03/09/2010  . Bariatric surgery status 07/07/2010  . ASYMPTOMATIC POSTMENOPAUSAL STATUS 09/29/2008  . Peripheral neuropathy [facility])     Past Surgical History  Procedure Laterality Date  . Abdominal hysterectomy    . Right total knee replacement    . Oophorectomy    . Skin tag removal      Social History   Social History  . Marital Status: Single    Spouse Name: N/A  . Number of Children: N/A  . Years of Education: N/A   Occupational History  . Not on file.   Social History Main Topics  . Smoking status: Former Research scientist (life sciences)  . Smokeless tobacco: Not on file  . Alcohol Use: Yes  . Drug Use: No  . Sexual Activity: Not on file   Other Topics Concern  . Not on file   Social History Narrative    Current Outpatient Prescriptions on File Prior to Visit  Medication Sig Dispense Refill  . Canagliflozin (INVOKANA) 300 MG TABS Take 1 tablet (300 mg total) by mouth daily. 30 tablet 11  . docusate sodium  (COLACE) 250 MG capsule Take 500 mg by mouth 2 (two) times daily.      . Flaxseed, Linseed, (FLAX SEED OIL PO) Take 1 capsule by mouth daily.      [name] levothyroxine (SYNTHROID, LEVOTHROID) 112 MCG tablet TAKE 1 TABLET BY MOUTH EVERY DAY 90 tablet 0  . loratadine (CLARITIN) 10 MG tablet Take 10 mg by mouth daily.      . metFORMIN (GLUCOPHAGE-XR) 500 MG 24 hr tablet 2 tabs, twice a day 360 tablet 1  . Multiple Vitamin (MULTIVITAMIN) tablet Take 1 tablet by mouth 2 (two) times daily.     . ONE TOUCH ULTRA TEST test strip USE AS DIRECTED 100 each 2  . oxyCODONE-acetaminophen (PERCOCET) 10-325 MG per tablet Take 1 tablet by mouth every 4 (four) hours as needed for pain. 100 tablet 0  . pantoprazole (PROTONIX) 40 MG tablet Take 1 tablet (40 mg total) by mouth daily. 30 tablet 2  . repaglinide (PRANDIN) 2 MG tablet Take 1 tablet (2 mg total) by mouth 3 (three) times daily before meals. 270 tablet 3  . simvastatin (ZOCOR) 40 MG tablet TAKE 1 TABLET BY MOUTH AT BEDTIME 30 tablet 0  . topiramate (TOPAMAX) 25 MG tablet take 2 tablets by mouth twice a day 360 tablet 0  . traMADol (ULTRAM) 50 MG tablet Take 1 tablet (50 mg total) by mouth every 6 (six) hours as needed. 100 tablet  2  . Bromocriptine Mesylate (CYCLOSET) 0.8 MG TABS Take 1 tablet by mouth at bedtime.    . Cholecalciferol (VITAMIN D3) 400 UNITS tablet Take 400 Units by mouth daily.       No current facility-administered medications on file prior to visit.    Allergies  Allergen Reactions  . Ace Inhibitors     REACTION: Cough  . Caffeine-Sodium Benzoate     REACTION: PVCs    Family History  Problem Relation Age of Onset  . Diabetes Mother   . Diabetes Father   . Liver cancer Maternal Aunt     2 aunt    BP 126/84 mmHg  Pulse 93  Temp(Src) 98.7 F (37.1 C) (Oral)  Ht 5\' 11"  (1.803 m)  Wt 194 lb (87.998 kg)  BMI 27.07 kg/m2  SpO2 97%  Review of Systems  Constitutional: Negative for fever.  HENT: Negative for hearing loss.     Eyes: Negative for visual disturbance.  Respiratory: Negative for shortness of breath.   Cardiovascular: Negative for chest pain.  Gastrointestinal: Negative for diarrhea.  Endocrine: Positive for cold intolerance.  Genitourinary: Negative for hematuria.  Musculoskeletal: Positive for back pain.  Skin: Negative for rash.  Allergic/Immunologic: Positive for environmental allergies.  Neurological: Negative for syncope and headaches.  Hematological: Bruises/bleeds easily.  Psychiatric/Behavioral: Negative for dysphoric mood.       Objective:   Physical Exam VS: see vs page GEN: no distress HEAD: head: no deformity eyes: no periorbital swelling, no proptosis external nose and ears are normal mouth: no lesion seen NECK: a healed scar is present.  i do not appreciate a nodule in the thyroid or elsewhere in the neck CHEST WALL: no deformity LUNGS:  Clear to auscultation BREASTS: sees gyn CV: reg rate and rhythm, no murmur ABD: abdomen is soft, nontender.  no hepatosplenomegaly.  not distended.  no hernia.  Several old healed surgical scars. GENITALIA/RECTAL: sees gyn MUSCULOSKELETAL: muscle bulk and strength are grossly normal.  no obvious joint swelling.  gait is normal and steady PULSES: no carotid bruit NEURO:  cn 2-12 grossly intact.   readily moves all 4's.  SKIN:  Normal texture and temperature.  No rash or suspicious lesion is visible.   NODES:  None palpable at the neck PSYCH: alert, well-oriented.  Does not appear anxious nor depressed.     Assessment & Plan:  Wellness visit today, with problems stable, except as noted.   SEPARATE EVALUATION FOLLOWS--EACH PROBLEM HERE IS NEW, NOT RESPONDING TO TREATMENT, OR POSES SIGNIFICANT RISK TO THE PATIENT'S HEALTH: HISTORY OF THE PRESENT ILLNESS: Pt returns for f/u of diabetes mellitus: DM type: 2 Dx'ed: [id number] Complications: polyneuropathy Therapy: 4 oral meds GDM: never DKA: never Severe hypoglycemia: never Pancreatitis:  never Other: she took insulin from 2004-2010, when she had gastric bypass surgery.   Interval history:    She has 6 mos of pain at the right shoulder, but no assoc numbness.   PAST MEDICAL HISTORY reviewed and up to date today REVIEW OF SYSTEMS: She has intermittent BPBPR, and slight rectal pain. PHYSICAL EXAMINATION: VITAL SIGNS:  See vs page GENERAL: no distress Pulses: dorsalis pedis intact bilat.   MSK: no deformity of the feet CV: 1+ left leg edema (trace on the right) Skin:  no ulcer on the feet.  normal color and temp on the feet. Neuro: sensation is intact to touch on the feet Ext: There is bilateral onychomycosis of the fingernails LAB/XRAY RESULTS: A1c=6.9% i personally reviewed electrocardiogram tracing (today):  Indication: DM Impression: normal  Pt brings records of perirectal bx. IMPRESSION: DM: well-controlled Onychomycosis, new Shoulder pain, new Dyslipidemia: there is a drug interaction between zocor and lamisil Sleep disturbance, new BRBPR, new PLAN:  Please see sleep, gastroenterology, and orthopedic specialists.  you will receive a phone call, about days and times for appointments.  i have sent a prescription to your pharmacy, for the fingernail fungus.  While on this, take just 1/2 of the simvastatin pill per day.

## 2015-10-14 NOTE — Patient Instructions (Addendum)
Please see sleep, gastroenterology, and orthopedic specialists.  you will receive a phone call, about days and times for appointments.  i have sent a prescription to your pharmacy, for the fingernail fungus.  While on this, take just 1/2 of the simvastatin pill per day.   please consider these measures for your health:  minimize alcohol.  do not use tobacco products.  have a colonoscopy at least every 10 years from age 61.  Women should have an annual mammogram from age 34.  keep firearms safely stored.  always use seat belts.  have working smoke alarms in your home.  see an eye doctor and dentist regularly.  never drive under the influence of alcohol or drugs (including prescription drugs).  those with fair skin should take precautions against the sun. Please come back for a follow-up appointment in 6 months.

## 2015-10-26 ENCOUNTER — Telehealth: Payer: Self-pay | Admitting: Endocrinology

## 2015-10-26 ENCOUNTER — Other Ambulatory Visit: Payer: Self-pay

## 2015-10-26 MED ORDER — PANTOPRAZOLE SODIUM 40 MG PO TBEC: 40.0000 mg | DELAYED_RELEASE_TABLET | Freq: Every day | ORAL | Status: DC

## 2015-10-26 MED ORDER — CANAGLIFLOZIN 300 MG PO TABS: 300.0000 mg | ORAL_TABLET | Freq: Every day | ORAL | Status: DC

## 2015-10-26 MED ORDER — REPAGLINIDE 2 MG PO TABS: 2.0000 mg | ORAL_TABLET | Freq: Three times a day (TID) | ORAL | Status: DC

## 2015-10-26 NOTE — Telephone Encounter (Signed)
Rx submitted per pt's request.

## 2015-10-26 NOTE — Telephone Encounter (Signed)
Patient called stating she would like medications sent to her pharmacy  These medications were prescribed in [place]   Rx: Invokana 300 mg 1x daily  Repaglinide 46m 3x daily  Pantoprazole 40 mg 1x daily   Please fill for 90 day supply   Pharmacy: [facility]   Please advise   Thank you

## 2015-10-28 ENCOUNTER — Encounter: Payer: Self-pay | Admitting: Endocrinology

## 2015-11-19 ENCOUNTER — Other Ambulatory Visit: Payer: Self-pay | Admitting: Endocrinology

## 2016-01-11 ENCOUNTER — Other Ambulatory Visit: Payer: Self-pay | Admitting: Endocrinology

## 2016-01-20 ENCOUNTER — Telehealth: Payer: Self-pay | Admitting: Endocrinology

## 2016-01-20 ENCOUNTER — Other Ambulatory Visit: Payer: Self-pay | Admitting: Endocrinology

## 2016-01-20 MED ORDER — SIMVASTATIN 40 MG PO TABS: 40.0000 mg | ORAL_TABLET | Freq: Every day | ORAL | Status: DC

## 2016-01-20 MED ORDER — GLUCOSE BLOOD VI STRP
ORAL_STRIP | Status: DC
Start: 2016-01-20 — End: 2016-10-19

## 2016-01-20 MED ORDER — REPAGLINIDE 2 MG PO TABS: 2.0000 mg | ORAL_TABLET | Freq: Three times a day (TID) | ORAL | Status: DC

## 2016-01-20 MED ORDER — CANAGLIFLOZIN 300 MG PO TABS: 300.0000 mg | ORAL_TABLET | Freq: Every day | ORAL | Status: DC

## 2016-01-20 MED ORDER — PANTOPRAZOLE SODIUM 40 MG PO TBEC: 40.0000 mg | DELAYED_RELEASE_TABLET | Freq: Every day | ORAL | Status: DC

## 2016-01-20 MED ORDER — TOPIRAMATE 25 MG PO TABS: 50.0000 mg | ORAL_TABLET | Freq: Two times a day (BID) | ORAL | Status: DC

## 2016-01-20 MED ORDER — METFORMIN HCL ER 500 MG PO TB24: ORAL_TABLET | ORAL | Status: DC

## 2016-01-20 MED ORDER — GABAPENTIN 300 MG PO CAPS: ORAL_CAPSULE | ORAL | Status: DC

## 2016-01-20 MED ORDER — LEVOTHYROXINE SODIUM 112 MCG PO TABS: 112.0000 ug | ORAL_TABLET | Freq: Every day | ORAL | Status: DC

## 2016-01-20 NOTE — Telephone Encounter (Signed)
Pt takes Topamax for spasms in her lower back

## 2016-01-20 NOTE — Telephone Encounter (Signed)
Pt stated she needs all of her medications called in with Dr. Cordelia Pen name instead of her old doctor's name Levothyroxine Pantoprozil Metformin Simvastatin Topiramate Invokana Gabapentin Repaglinide One Touch Ultra Strips  [facility]

## 2016-01-20 NOTE — Telephone Encounter (Signed)
Please refill all except the topirimate What is the topiramate for?

## 2016-01-20 NOTE — Telephone Encounter (Signed)
I contacted the pt and requested a call back to verify the use of the topamax.

## 2016-01-20 NOTE — Telephone Encounter (Signed)
See note below. The gabapentin is listed under a historical provider. Can we refill this med? I sent the rest of the medication refills in for the pt.

## 2016-02-03 ENCOUNTER — Ambulatory Visit (INDEPENDENT_AMBULATORY_CARE_PROVIDER_SITE_OTHER): Payer: 59 | Admitting: Internal Medicine

## 2016-02-03 ENCOUNTER — Encounter: Payer: Self-pay | Admitting: Internal Medicine

## 2016-02-03 VITALS — BP 122/74 | HR 92 | Ht 71.0 in | Wt 185.6 lb

## 2016-02-03 DIAGNOSIS — R7611 Nonspecific reaction to tuberculin skin test without active tuberculosis: Secondary | ICD-10-CM

## 2016-02-03 DIAGNOSIS — G479 Sleep disorder, unspecified: Secondary | ICD-10-CM

## 2016-02-03 DIAGNOSIS — G4733 Obstructive sleep apnea (adult) (pediatric): Secondary | ICD-10-CM | POA: Diagnosis not present

## 2016-02-03 NOTE — Patient Instructions (Signed)
Order- schedule split protocol NPSG    Dx OSA, hypersomnia

## 2016-02-03 NOTE — Progress Notes (Signed)
02/03/2016-60 year old female former smoker seen for complaint of hypersomnia, complicated by depression, DM2, history positive PPD untreated, hypothyroid FOLLOW FOR:  Referred by Dr. Loanne Drilling; falls asleep frequently during the day, always tired.  Epworth Score: 21 TSH wnl 10/14/15. Lost 125 pounds after bariatric surgery. 46 or 7 years reports aware of excessive daytime sleepiness. Always tired. Her FitBit tells her sleep is restless and short. She sleeps alone usually but has been told she snores. Wakes alert but one hour later "exhausted". Falling asleep in meetings and driving. Concerned about impact on work. Physically uncomfortable and sleep because of bilateral shoulder pain. Sleeps with TV on and bedroom. Admits depression because not working regularly. Not aware of dreams. History of PVCs-avoids caffeine and reluctant to take meds which might aggravate this. Works in Emergency planning/management officer. When working she frequently is traveling out of town. ENT surgery-tonsils and adenoids. Denies sleep paralysis and cataplexy as described. Additional problem-recognized positive PPD since age 61, never treated. Unknown exposure. Previous chest CT showed bilateral calcified granulomas and calcification and mediastinal nodes consistent with old granulomatous infection. She was evaluated by Infectious Disease a few years ago for fever and chills which resolved with antibiotic. Workup included chest CT. Prophylactic TB therapy was recommended at that time but she did not except. Now she is pondering whether to go ahead.  Prior to Admission medications   Medication Sig Start Date End Date Taking? Authorizing Provider  canagliflozin (INVOKANA) 300 MG TABS tablet Take 1 tablet (300 mg total) by mouth daily. 01/20/16  Yes Renato Shin, MD  Cholecalciferol (VITAMIN D3) 400 UNITS tablet Take 400 Units by mouth daily.     Yes Historical Provider, MD  docusate sodium (COLACE) 250 MG capsule Take 500 mg by mouth 2 (two) times  daily.     Yes Historical Provider, MD  Flaxseed, Linseed, (FLAX SEED OIL PO) Take 1 capsule by mouth daily.     Yes Historical Provider, MD  fluticasone (FLONASE) 50 MCG/ACT nasal spray Place 2 sprays into both nostrils daily.   Yes Historical Provider, MD  gabapentin (NEURONTIN) 300 MG capsule 2 tabs 2 times per day 01/20/16  Yes Renato Shin, MD  glucose blood (ONE TOUCH ULTRA TEST) test strip Use to check blood sugar 1 time per day. 01/20/16  Yes Renato Shin, MD  levothyroxine (SYNTHROID, LEVOTHROID) 112 MCG tablet Take 1 tablet (112 mcg total) by mouth daily. 09/29/12 01/19/17 Yes Renato Shin, MD  loratadine (CLARITIN) 10 MG tablet Take 10 mg by mouth daily.     Yes Historical Provider, MD  metFORMIN (GLUCOPHAGE-XR) 500 MG 24 hr tablet 2 tabs, twice a day 01/20/16  Yes Renato Shin, MD  Multiple Vitamin (MULTIVITAMIN) tablet Take 1 tablet by mouth 2 (two) times daily.    Yes Historical Provider, MD  oxyCODONE-acetaminophen (PERCOCET) 10-325 MG per tablet Take 1 tablet by mouth every 4 (four) hours as needed for pain. 03/16/14  Yes Renato Shin, MD  pantoprazole (PROTONIX) 40 MG tablet Take 1 tablet (40 mg total) by mouth daily. 01/20/16  Yes Renato Shin, MD  repaglinide (PRANDIN) 2 MG tablet Take 1 tablet (2 mg total) by mouth 3 (three) times daily before meals. 01/20/16  Yes Renato Shin, MD  simvastatin (ZOCOR) 40 MG tablet Take 1 tablet (40 mg total) by mouth at bedtime. 01/20/16  Yes Renato Shin, MD  terbinafine (LAMISIL) 250 MG tablet TAKE 1 TABLET EVERY DAY 01/11/16  Yes Renato Shin, MD  topiramate (TOPAMAX) 25 MG tablet Take 2 tablets (50 mg  total) by mouth 2 (two) times daily. 01/20/16  Yes Renato Shin, MD  traMADol (ULTRAM) 50 MG tablet Take 1 tablet (50 mg total) by mouth every 6 (six) hours as needed. 10/16/13  Yes Renato Shin, MD   Past Medical History  Diagnosis Date  . THYROID NODULE 03/09/2010  . Postsurgical hypothyroidism 09/19/2010  . DIABETES MELLITUS, TYPE II 07/08/2007  .  HYPERLIPIDEMIA 07/08/2007  . OBESITY 01/06/2008  . LEUKOPENIA, MILD 09/29/2008  . DEPRESSION 11/21/2007  . PERIPHERAL NEUROPATHY 11/21/2007  . HYPERTENSION 07/08/2007  . SINUSITIS- ACUTE-NOS 11/21/2007  . ALLERGIC RHINITIS 11/21/2007  . Eosinophilic esophagitis 11/17/1094  . Other chronic nonalcoholic liver disease 0/45/4098  . Acute cystitis 08/17/2009  . OSTEOARTHRITIS 01/06/2008  . FEVER UNSPECIFIED 10/20/2009  . Edema 05/20/2009  . Headache(784.0) 02/06/2010  . DYSPNEA 05/20/2009  . ABDOMINAL PAIN, CHRONIC 10/20/2009  . Hepatomegaly 01/06/2008  . TB SKIN TEST, POSITIVE 02/06/2010  . FEVER, HX OF 03/09/2010  . Bariatric surgery status 07/07/2010  . ASYMPTOMATIC POSTMENOPAUSAL STATUS 09/29/2008  . Peripheral neuropathy [facility])    Past Surgical History  Procedure Laterality Date  . Abdominal hysterectomy    . Right total knee replacement    . Oophorectomy    . Skin tag removal     Family History  Problem Relation Age of Onset  . Diabetes Mother   . Diabetes Father   . Liver cancer Maternal Aunt     2 aunt   Social History   Social History  . Marital Status: Single    Spouse Name: N/A  . Number of Children: N/A  . Years of Education: N/A   Occupational History  . Not on file.   Social History Main Topics  . Smoking status: Former Smoker -- 1.50 packs/day for 15 years    Types: Cigarettes  . Smokeless tobacco: Former Systems developer    Quit date: 02/03/1991  . Alcohol Use: 0.0 oz/week    0 Standard drinks or equivalent per week     Comment: rarely  . Drug Use: No  . Sexual Activity: Not on file   Other Topics Concern  . Not on file   Social History Narrative   ROS-see HPI   Negative unless "+" Constitutional:    weight loss, night sweats, fevers, chills, fatigue, lassitude. HEENT:    headaches, difficulty swallowing, tooth/dental problems, sore throat,       sneezing, itching, ear ache, nasal congestion, post nasal drip, snoring CV:    chest pain, orthopnea, PND, swelling in lower  extremities, anasarca,                                                    dizziness, palpitations Resp:   shortness of breath with exertion or at rest.                productive cough,   non-productive cough, coughing up of blood.              change in color of mucus.  wheezing.   Skin:    rash or lesions. GI:  No-   heartburn, indigestion, abdominal pain, nausea, vomiting, diarrhea,                 change in bowel habits, loss of appetite GU: dysuria, change in color of urine, no urgency or frequency.  flank pain. MS:   joint pain, stiffness, decreased range of motion, back pain. Neuro-     nothing unusual Psych:  change in mood or affect.  depression or anxiety.   memory loss.  OBJ- Physical Exam General- Alert, Oriented, Affect-appropriate, Distress- none acute Skin- rash-none, lesions- none, excoriation- none Lymphadenopathy- none Head- atraumatic            Eyes- Gross vision intact, PERRLA, conjunctivae and secretions clear            Ears- Hearing, canals-normal            Nose- Clear, no-Septal dev, mucus, polyps, erosion, perforation             Throat- Mallampati II , mucosa clear , drainage- none, tonsils- atrophic Neck- flexible , trachea midline, no stridor , thyroid nl, carotid no bruit Chest - symmetrical excursion , unlabored           Heart/CV- RRR , no murmur , no gallop  , no rub, nl s1 s2                           - JVD- none , edema- none, stasis changes- none, varices- none           Lung- clear to P&A, wheeze- none, cough- none , dullness-none, rub- none           Chest wall-  Abd-  Br/ Gen/ Rectal- Not done, not indicated Extrem- cyanosis- none, clubbing, none, atrophy- none, strength- nl Neuro- grossly intact to observation

## 2016-02-03 NOTE — Assessment & Plan Note (Signed)
Hypersomnia which sounds like true sleepiness but without sleep paralysis or cataplexy. Limited information suggests she may snore significantly and have more restless sleep and she recognizes. Complicating maybe her history of depression and hypothyroidism, both being treated. We will schedule sleep study for now but she may need a multiple sleep latency test in the future. Plan-schedule sleep study

## 2016-03-12 ENCOUNTER — Other Ambulatory Visit: Payer: Self-pay | Admitting: Internal Medicine

## 2016-03-12 DIAGNOSIS — G4733 Obstructive sleep apnea (adult) (pediatric): Secondary | ICD-10-CM

## 2016-04-06 ENCOUNTER — Encounter (HOSPITAL_BASED_OUTPATIENT_CLINIC_OR_DEPARTMENT_OTHER): Payer: 59

## 2016-04-13 ENCOUNTER — Ambulatory Visit (INDEPENDENT_AMBULATORY_CARE_PROVIDER_SITE_OTHER): Payer: 59 | Admitting: Endocrinology

## 2016-04-13 ENCOUNTER — Telehealth: Payer: Self-pay | Admitting: Internal Medicine

## 2016-04-13 ENCOUNTER — Encounter: Payer: Self-pay | Admitting: Endocrinology

## 2016-04-13 VITALS — BP 118/70 | HR 74 | Temp 97.5°F | Ht 71.0 in | Wt 187.0 lb

## 2016-04-13 DIAGNOSIS — Z794 Long term (current) use of insulin: Secondary | ICD-10-CM | POA: Diagnosis not present

## 2016-04-13 DIAGNOSIS — R600 Localized edema: Secondary | ICD-10-CM

## 2016-04-13 DIAGNOSIS — I10 Essential (primary) hypertension: Secondary | ICD-10-CM

## 2016-04-13 DIAGNOSIS — K7581 Nonalcoholic steatohepatitis (NASH): Secondary | ICD-10-CM | POA: Diagnosis not present

## 2016-04-13 DIAGNOSIS — E119 Type 2 diabetes mellitus without complications: Secondary | ICD-10-CM | POA: Diagnosis not present

## 2016-04-13 LAB — BASIC METABOLIC PANEL
BUN: 19 mg/dL (ref 6–23)
CO2: 25 mEq/L (ref 19–32)
Calcium: 9.2 mg/dL (ref 8.4–10.5)
Chloride: 109 mEq/L (ref 96–112)
Creatinine, Ser: 0.97 mg/dL (ref 0.40–1.20)
GFR: 61.86 mL/min (ref >60.00)
Glucose, Bld: 117 mg/dL — ABNORMAL HIGH (ref 70–99)
Potassium: 4 mEq/L (ref 3.5–5.1)
Sodium: 142 mEq/L (ref 135–145)

## 2016-04-13 LAB — HEPATIC FUNCTION PANEL
ALT: 28 U/L (ref 0–35)
AST: 22 U/L (ref 0–37)
Albumin: 4.1 g/dL (ref 3.5–5.2)
Alkaline Phosphatase: 91 U/L (ref 39–117)
Bilirubin, Direct: 0.1 mg/dL (ref 0.0–0.3)
Total Bilirubin: 0.4 mg/dL (ref 0.2–1.2)
Total Protein: 6.4 g/dL (ref 6.0–8.3)

## 2016-04-13 LAB — POCT GLYCOSYLATED HEMOGLOBIN (HGB A1C): Hemoglobin A1C: 7.2

## 2016-04-13 MED ORDER — TERBINAFINE HCL 250 MG PO TABS: 250.0000 mg | ORAL_TABLET | Freq: Every day | ORAL | Status: DC

## 2016-04-13 NOTE — Telephone Encounter (Signed)
Ok

## 2016-04-13 NOTE — Patient Instructions (Addendum)
Please continue the same medications.  i have sent a prescription to your pharmacy, to refill the lamisil.  blood tests are requested for you today.  We'll let you know about the results.  Please see a specialist for the leg swelling.  you will receive a phone call, about a day and time for an appointment.  Please come back for a regular physical appointment in 6 months (must be after 10/13/16).

## 2016-04-13 NOTE — Telephone Encounter (Signed)
An order was put in for HST for this patient on 03/12/2016 and the patient wanted to check with the insurance before setting up the appt to do the HST. She called me back today and stated that she had not met her deductible yet and didn't want to do the HST at this time. She also wanted me to cancel her appt with Dr. Annamaria Boots on 04/30/2016. I told here I would put a message in to Dr. Annamaria Boots and Joellen Jersey making them aware of this and put the order on hold for now or until the patient called back to schedule the HST

## 2016-04-13 NOTE — Progress Notes (Signed)
Subjective:    Patient ID: Savannah [name], female    DOB: 25-Mar-1956, 60 y.o.   MRN: [id number]  HPI Pt returns for f/u of diabetes mellitus:  DM type: 2 Dx'ed: [id number] Complications: polyneuropathy Therapy: 3 oral meds GDM: never DKA: never Severe hypoglycemia: never Pancreatitis: never Other: she took insulin from 2004-2011, when she had gastric bypass surgery.   Interval history:  She sometimes misses the prandin.   Fingernail fungus is improved, but she wants to continue lamisil for now.  Edema persists.  Past Medical History  Diagnosis Date  . THYROID NODULE 03/09/2010  . Postsurgical hypothyroidism 09/19/2010  . DIABETES MELLITUS, TYPE II 07/08/2007  . HYPERLIPIDEMIA 07/08/2007  . OBESITY 01/06/2008  . LEUKOPENIA, MILD 09/29/2008  . DEPRESSION 11/21/2007  . PERIPHERAL NEUROPATHY 11/21/2007  . HYPERTENSION 07/08/2007  . SINUSITIS- ACUTE-NOS 11/21/2007  . ALLERGIC RHINITIS 11/21/2007  . Eosinophilic esophagitis 0000000  . Other chronic nonalcoholic liver disease [id number]  . Acute cystitis 08/17/2009  . OSTEOARTHRITIS 01/06/2008  . FEVER UNSPECIFIED 10/20/2009  . Edema 05/20/2009  . Headache(784.0) 02/06/2010  . DYSPNEA 05/20/2009  . ABDOMINAL PAIN, CHRONIC 10/20/2009  . Hepatomegaly 01/06/2008  . TB SKIN TEST, POSITIVE 02/06/2010  . FEVER, HX OF 03/09/2010  . Bariatric surgery status 07/07/2010  . ASYMPTOMATIC POSTMENOPAUSAL STATUS 09/29/2008  . Peripheral neuropathy [facility])     Past Surgical History  Procedure Laterality Date  . Abdominal hysterectomy    . Right total knee replacement    . Oophorectomy    . Skin tag removal      Social History   Social History  . Marital Status: Single    Spouse Name: N/A  . Number of Children: N/A  . Years of Education: N/A   Occupational History  . Not on file.   Social History Main Topics  . Smoking status: Former Smoker -- 1.50 packs/day for 15 years    Types: Cigarettes  . Smokeless tobacco: Former Systems developer    Quit date: 02/03/1991  .  Alcohol Use: 0.0 oz/week    0 Standard drinks or equivalent per week     Comment: rarely  . Drug Use: No  . Sexual Activity: Not on file   Other Topics Concern  . Not on file   Social History Narrative    Current Outpatient Prescriptions on File Prior to Visit  Medication Sig Dispense Refill  . canagliflozin (INVOKANA) 300 MG TABS tablet Take 1 tablet (300 mg total) by mouth daily. 90 tablet 1  . Cholecalciferol (VITAMIN D3) 400 UNITS tablet Take 400 Units by mouth daily.      [name] docusate sodium (COLACE) 250 MG capsule Take 500 mg by mouth 2 (two) times daily.      . Flaxseed, Linseed, (FLAX SEED OIL PO) Take 1 capsule by mouth daily.      . fluticasone (FLONASE) 50 MCG/ACT nasal spray Place 2 sprays into both nostrils daily.    [name] gabapentin (NEURONTIN) 300 MG capsule 2 tabs 2 times per day 360 capsule 2  . glucose blood (ONE TOUCH ULTRA TEST) test strip Use to check blood sugar 1 time per day. 100 each 2  . levothyroxine (SYNTHROID, LEVOTHROID) 112 MCG tablet Take 1 tablet (112 mcg total) by mouth daily. 90 tablet 3  . loratadine (CLARITIN) 10 MG tablet Take 10 mg by mouth daily.      . metFORMIN (GLUCOPHAGE-XR) 500 MG 24 hr tablet 2 tabs, twice a day 360 tablet 1  . Multiple Vitamin (MULTIVITAMIN)  tablet Take 1 tablet by mouth 2 (two) times daily.     . pantoprazole (PROTONIX) 40 MG tablet Take 1 tablet (40 mg total) by mouth daily. 90 tablet 2  . repaglinide (PRANDIN) 2 MG tablet Take 1 tablet (2 mg total) by mouth 3 (three) times daily before meals. 180 tablet 2  . simvastatin (ZOCOR) 40 MG tablet Take 1 tablet (40 mg total) by mouth at bedtime. 90 tablet 2  . topiramate (TOPAMAX) 25 MG tablet Take 2 tablets (50 mg total) by mouth 2 (two) times daily. 360 tablet 2  . traMADol (ULTRAM) 50 MG tablet Take 1 tablet (50 mg total) by mouth every 6 (six) hours as needed. 100 tablet 2   No current facility-administered medications on file prior to visit.    Allergies  Allergen Reactions   . Ace Inhibitors     REACTION: Cough  . Caffeine-Sodium Benzoate     REACTION: PVCs  . Nsaids     Gastric Bypass Surgery - unable to take    Family History  Problem Relation Age of Onset  . Diabetes Mother   . Diabetes Father   . Liver cancer Maternal Aunt     2 aunt    BP 118/70 mmHg  Pulse 74  Temp(Src) 97.5 F (36.4 C) (Oral)  Ht 5\' 11"  (1.803 m)  Wt 187 lb (84.823 kg)  BMI 26.09 kg/m2  SpO2 97%  Review of Systems She has lost a few lbs.  Denies sob.      Objective:   Physical Exam VITAL SIGNS:  See vs page GENERAL: no distress Pulses: dorsalis pedis intact bilat.   MSK: no deformity of the feet CV: trace leg edema Skin:  no ulcer on the feet.  normal color and temp on the feet. Neuro: sensation is intact to touch on the feet Ext: There is bilateral onychomycosis of the fingernails    A1c=7.2%    Assessment & Plan:  Onychomycosis, persistent.  Pt wants to continue rx Type 2 DM she needs to take the prandin TID:   i'll do the best i can.  Edema, persistent.   Patient is advised the following: Patient Instructions  Please continue the same medications.  i have sent a prescription to your pharmacy, to refill the lamisil.  blood tests are requested for you today.  We'll let you know about the results.  Please see a specialist for the leg swelling.  you will receive a phone call, about a day and time for an appointment.  Please come back for a regular physical appointment in 6 months (must be after 10/13/16).     Renato Shin, MD

## 2016-04-18 ENCOUNTER — Other Ambulatory Visit: Payer: Self-pay | Admitting: *Deleted

## 2016-04-18 DIAGNOSIS — R609 Edema, unspecified: Secondary | ICD-10-CM

## 2016-04-19 ENCOUNTER — Ambulatory Visit (HOSPITAL_COMMUNITY)
Admission: RE | Admit: 2016-04-19 | Discharge: 2016-04-19 | Disposition: A | Discharge status: Home / Self Care | Payer: 59 | Source: Ambulatory Visit | Attending: Vascular Surgery | Admitting: Vascular Surgery

## 2016-04-19 ENCOUNTER — Encounter: Payer: Self-pay | Admitting: Vascular Surgery

## 2016-04-19 ENCOUNTER — Ambulatory Visit (INDEPENDENT_AMBULATORY_CARE_PROVIDER_SITE_OTHER): Payer: 59 | Admitting: Vascular Surgery

## 2016-04-19 VITALS — BP 120/78 | HR 68 | Temp 97.4°F | Resp 14 | Ht 71.0 in | Wt 187.0 lb

## 2016-04-19 DIAGNOSIS — R609 Edema, unspecified: Secondary | ICD-10-CM | POA: Insufficient documentation

## 2016-04-19 DIAGNOSIS — F329 Major depressive disorder, single episode, unspecified: Secondary | ICD-10-CM | POA: Diagnosis not present

## 2016-04-19 DIAGNOSIS — M7989 Other specified soft tissue disorders: Secondary | ICD-10-CM | POA: Diagnosis not present

## 2016-04-19 DIAGNOSIS — I10 Essential (primary) hypertension: Secondary | ICD-10-CM | POA: Insufficient documentation

## 2016-04-19 DIAGNOSIS — E1142 Type 2 diabetes mellitus with diabetic polyneuropathy: Secondary | ICD-10-CM | POA: Insufficient documentation

## 2016-04-19 DIAGNOSIS — I8392 Asymptomatic varicose veins of left lower extremity: Secondary | ICD-10-CM | POA: Diagnosis not present

## 2016-04-19 DIAGNOSIS — E785 Hyperlipidemia, unspecified: Secondary | ICD-10-CM | POA: Diagnosis not present

## 2016-04-19 NOTE — Progress Notes (Signed)
Referring Physician: Renato Shin, MD  Patient name: Savannah [name] MRN: [id number] DOB: 26-Apr-1956 Sex: female  REASON FOR CONSULT: Leg swelling  HPI: Savannah [name] is a 60 y.o. female, with chronic leg swelling left greater than right. The patient has previously had hysterectomy with lymphadenectomy for uterine cancer in the past. She denies any prior history of DVT. She has worn some compression stockings in the past but not in several years. She also complains of neuropathy symptoms in both feet with burning stinging and pain. This is reasonably controlled but not perfectly controlled with Neurontin. 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  She denies any ulcerations or lengthy time to heal wounds on her feet. However, she states it does take longer to heal than it used  Other medical problems include diabetes hyperlipidemia depression peripheral neuropathy hypertension all of which are stable except as mentioned above.  Past Medical History  Diagnosis Date  . THYROID NODULE 03/09/2010  . Postsurgical hypothyroidism 09/19/2010  . DIABETES MELLITUS, TYPE II 07/08/2007  . HYPERLIPIDEMIA 07/08/2007  . OBESITY 01/06/2008  . LEUKOPENIA, MILD 09/29/2008  . DEPRESSION 11/21/2007  . PERIPHERAL NEUROPATHY 11/21/2007  . HYPERTENSION 07/08/2007  . SINUSITIS- ACUTE-NOS 11/21/2007  . ALLERGIC RHINITIS 11/21/2007  . Eosinophilic esophagitis 03/15/85  . Other chronic nonalcoholic liver disease 7/61/9509  . Acute cystitis 08/17/2009  . OSTEOARTHRITIS 01/06/2008  . FEVER UNSPECIFIED 10/20/2009  . Edema 05/20/2009  . Headache(784.0) 02/06/2010  . DYSPNEA 05/20/2009  . ABDOMINAL PAIN, CHRONIC 10/20/2009  . Hepatomegaly 01/06/2008  . TB SKIN TEST, POSITIVE 02/06/2010  . FEVER, HX OF 03/09/2010  . Bariatric surgery status 07/07/2010  . ASYMPTOMATIC POSTMENOPAUSAL STATUS 09/29/2008  . Peripheral neuropathy [facility])    Past Surgical History    Procedure Laterality Date  . Abdominal hysterectomy    . Right total knee replacement    . Oophorectomy    . Skin tag removal      Family History  Problem Relation Age of Onset  . Diabetes Mother   . Diabetes Father   . Liver cancer Maternal Aunt     2 aunt    SOCIAL HISTORY: Social History   Social History  . Marital Status: Single    Spouse Name: N/A  . Number of Children: N/A  . Years of Education: N/A   Occupational History  . Not on file.   Social History Main Topics  . Smoking status: Former Smoker -- 1.50 packs/day for 15 years    Types: Cigarettes  . Smokeless tobacco: Former Systems developer    Quit date: 02/03/1991  . Alcohol Use: 0.0 oz/week    0 Standard drinks or equivalent per week     Comment: rarely  . Drug Use: No  . Sexual Activity: Not on file   Other Topics Concern  . Not on file   Social History Narrative    Allergies  Allergen Reactions  . Ace Inhibitors     REACTION: Cough  . Caffeine-Sodium Benzoate     REACTION: PVCs  . Nsaids     Gastric Bypass Surgery - unable to take    Current Outpatient Prescriptions  Medication Sig Dispense Refill  . canagliflozin (INVOKANA) 300 MG TABS tablet Take 1 tablet (300 mg total) by mouth daily. 90 tablet 1  . Cholecalciferol (VITAMIN D3) 400 UNITS tablet Take 400 Units by mouth daily.      [name] docusate  sodium (COLACE) 250 MG capsule Take 500 mg by mouth 2 (two) times daily.      . Flaxseed, Linseed, (FLAX SEED OIL PO) Take 1 capsule by mouth daily.      . fluticasone (FLONASE) 50 MCG/ACT nasal spray Place 2 sprays into both nostrils daily.    [name] gabapentin (NEURONTIN) 300 MG capsule 2 tabs 2 times per day 360 capsule 2  . glucose blood (ONE TOUCH ULTRA TEST) test strip Use to check blood sugar 1 time per day. 100 each 2  . HYDROcodone-acetaminophen (NORCO/VICODIN) 5-325 MG tablet Take 1 tablet by mouth every 6 (six) hours as needed for moderate pain.    [name] levothyroxine (SYNTHROID, LEVOTHROID) 112 MCG tablet  Take 1 tablet (112 mcg total) by mouth daily. 90 tablet 3  . loratadine (CLARITIN) 10 MG tablet Take 10 mg by mouth daily.      . metFORMIN (GLUCOPHAGE-XR) 500 MG 24 hr tablet 2 tabs, twice a day 360 tablet 1  . Multiple Vitamin (MULTIVITAMIN) tablet Take 1 tablet by mouth 2 (two) times daily.     . pantoprazole (PROTONIX) 40 MG tablet Take 1 tablet (40 mg total) by mouth daily. 90 tablet 2  . repaglinide (PRANDIN) 2 MG tablet Take 1 tablet (2 mg total) by mouth 3 (three) times daily before meals. 180 tablet 2  . simvastatin (ZOCOR) 40 MG tablet Take 1 tablet (40 mg total) by mouth at bedtime. 90 tablet 2  . terbinafine (LAMISIL) 250 MG tablet Take 1 tablet (250 mg total) by mouth daily. 90 tablet 0  . topiramate (TOPAMAX) 25 MG tablet Take 2 tablets (50 mg total) by mouth 2 (two) times daily. 360 tablet 2  . traMADol (ULTRAM) 50 MG tablet Take 1 tablet (50 mg total) by mouth every 6 (six) hours as needed. 100 tablet 2   No current facility-administered medications for this visit.    ROS:   General:  No weight loss, Fever, chills  HEENT: No recent headaches, no nasal bleeding, no visual changes, no sore throat  Neurologic: No dizziness, blackouts, seizures. No recent symptoms of stroke or mini- stroke. No recent episodes of slurred speech, or temporary blindness.  Cardiac: No recent episodes of chest pain/pressure, no shortness of breath at rest.  No shortness of breath with exertion.  Denies history of atrial fibrillation or irregular heartbeat  Vascular: No history of rest pain in feet.  No history of claudication.  No history of non-healing ulcer, No history of DVT   Pulmonary: No home oxygen, no productive cough, no hemoptysis,  No asthma or wheezing  Musculoskeletal:  [ ]  Arthritis, [ ]  Low back pain,  [x ] Joint pain  Hematologic:No history of hypercoagulable state.  No history of easy bleeding.  No history of anemia  Gastrointestinal: No hematochezia or melena,  No  gastroesophageal reflux, no trouble swallowing  Urinary: [ ]  chronic Kidney disease, [ ]  on HD - [ ]  MWF or [ ]  TTHS, [ ]  Burning with urination, [ ]  Frequent urination, [ ]  Difficulty urinating;   Skin: No rashes  Psychological: No history of anxiety,  + history of depression   Physical Examination  Filed Vitals:   04/19/16 1454  BP: 120/78  Pulse: 68  Temp: 97.4 F (36.3 C)  TempSrc: Oral  Resp: 14  Height: 5' 11"  (1.803 m)  Weight: 187 lb (84.823 kg)  SpO2: 100%    Body mass index is 26.09 kg/(m^2).  General:  Alert and oriented, no acute  distress HEENT: Normal Neck: No bruit or JVD Pulmonary: Clear to auscultation bilaterally Cardiac: Regular Rate and Rhythm without murmur Abdomen: Soft, non-tender, non-distended, no mass Skin: No rash, no ulcer Extremity Pulses:  2+ radial, brachial, femoral, Absent dorsalis pedis, posterior tibial pulses bilaterally Musculoskeletal: No deformity trace edema left greater than right nonpitting some squaring of the toes to the left foot and slight buffalo hump  Neurologic: Upper and lower extremity motor 5/5 and symmetric  DATA:  Patient had a venous reflux ultrasound today. This showed reflux in the deep venous system  ASSESSMENT: Chronic leg swelling multifactorial some component of lymphedema from her previous lymphadenectomy in the pelvis as well as reflux in her deep venous system. No vascular surgical intervention will benefit this sort of leg swelling.   PLAN: Bilateral thigh-high 20-30 mm compression stockings to wear during the day as much as possible. Discussed with the patient wearing his compression stockings to prevent skin breakdown over time. Also discussed with her protecting her feet especially since she has diabetes. She will follow-up with [place] on as-needed basis.   Ruta Hinds, MD [facility] Office: [phone] Pager: [phone]

## 2016-04-20 ENCOUNTER — Ambulatory Visit: Payer: 59 | Admitting: Internal Medicine

## 2016-04-26 ENCOUNTER — Ambulatory Visit (INDEPENDENT_AMBULATORY_CARE_PROVIDER_SITE_OTHER): Payer: 59 | Admitting: Endocrinology

## 2016-04-26 ENCOUNTER — Encounter: Payer: Self-pay | Admitting: Endocrinology

## 2016-04-26 VITALS — BP 98/60 | HR 84 | Temp 98.1°F | Ht 71.0 in | Wt 189.4 lb

## 2016-04-26 DIAGNOSIS — R21 Rash and other nonspecific skin eruption: Secondary | ICD-10-CM | POA: Insufficient documentation

## 2016-04-26 LAB — CBC WITH DIFFERENTIAL/PLATELET
Basophils Absolute: 0 10*3/uL (ref 0.0–0.1)
Basophils Relative: 0.7 % (ref 0.0–3.0)
Eosinophils Absolute: 0.1 10*3/uL (ref 0.0–0.7)
Eosinophils Relative: 3.7 % (ref 0.0–5.0)
HCT: 35.7 % — ABNORMAL LOW (ref 36.0–46.0)
Hemoglobin: 12.1 g/dL (ref 12.0–15.0)
Lymphocytes Relative: 40.4 % (ref 12.0–46.0)
Lymphs Abs: 1.2 10*3/uL (ref 0.7–4.0)
MCHC: 34 g/dL (ref 30.0–36.0)
MCV: 93.4 fl (ref 78.0–100.0)
Monocytes Absolute: 0.3 10*3/uL (ref 0.1–1.0)
Monocytes Relative: 9.3 % (ref 3.0–12.0)
Neutro Abs: 1.4 10*3/uL (ref 1.4–7.7)
Neutrophils Relative %: 45.9 % (ref 43.0–77.0)
Platelets: 142 10*3/uL — ABNORMAL LOW (ref 150.0–400.0)
RBC: 3.82 Mil/uL — ABNORMAL LOW (ref 3.87–5.11)
RDW: 14 % (ref 11.5–15.5)
WBC: 3 10*3/uL — ABNORMAL LOW (ref 4.0–10.5)

## 2016-04-26 LAB — SEDIMENTATION RATE: Sed Rate: 14 mm/hr (ref 0–30)

## 2016-04-26 NOTE — Progress Notes (Signed)
Subjective:    Patient ID: Savannah [name], female    DOB: [birth year], 60 y.o.   MRN: [id number]  HPI Pt states 1 day of moderate rash on the feet and lower legs, but no assoc itching.  She is unable to cite precip factor.  She says she has been wearing shorts, so the pattern does not necessarily follow the pattern of recent sun exposure.    Past Medical History  Diagnosis Date  . THYROID NODULE 03/09/2010  . Postsurgical hypothyroidism 09/19/2010  . DIABETES MELLITUS, TYPE II 07/08/2007  . HYPERLIPIDEMIA 07/08/2007  . OBESITY 01/06/2008  . LEUKOPENIA, MILD 09/29/2008  . DEPRESSION 11/21/2007  . PERIPHERAL NEUROPATHY 11/21/2007  . HYPERTENSION 07/08/2007  . SINUSITIS- ACUTE-NOS 11/21/2007  . ALLERGIC RHINITIS 11/21/2007  . Eosinophilic esophagitis 0000000  . Other chronic nonalcoholic liver disease [id number]  . Acute cystitis 08/17/2009  . OSTEOARTHRITIS 01/06/2008  . FEVER UNSPECIFIED 10/20/2009  . Edema 05/20/2009  . Headache(784.0) 02/06/2010  . DYSPNEA 05/20/2009  . ABDOMINAL PAIN, CHRONIC 10/20/2009  . Hepatomegaly 01/06/2008  . TB SKIN TEST, POSITIVE 02/06/2010  . FEVER, HX OF 03/09/2010  . Bariatric surgery status 07/07/2010  . ASYMPTOMATIC POSTMENOPAUSAL STATUS 09/29/2008  . Peripheral neuropathy [facility])     Past Surgical History  Procedure Laterality Date  . Abdominal hysterectomy    . Right total knee replacement    . Oophorectomy    . Skin tag removal      Social History   Social History  . Marital Status: Single    Spouse Name: N/A  . Number of Children: N/A  . Years of Education: N/A   Occupational History  . Not on file.   Social History Main Topics  . Smoking status: Former Smoker -- 1.50 packs/day for 15 years    Types: Cigarettes  . Smokeless tobacco: Former Systems developer    Quit date: 02/03/1991  . Alcohol Use: 0.0 oz/week    0 Standard drinks or equivalent per week     Comment: rarely  . Drug Use: No  . Sexual Activity: Not on file   Other Topics Concern  . Not on file    Social History Narrative    Current Outpatient Prescriptions on File Prior to Visit  Medication Sig Dispense Refill  . canagliflozin (INVOKANA) 300 MG TABS tablet Take 1 tablet (300 mg total) by mouth daily. 90 tablet 1  . Cholecalciferol (VITAMIN D3) 400 UNITS tablet Take 400 Units by mouth daily.      [name] docusate sodium (COLACE) 250 MG capsule Take 500 mg by mouth 2 (two) times daily.      . Flaxseed, Linseed, (FLAX SEED OIL PO) Take 1 capsule by mouth daily.      . fluticasone (FLONASE) 50 MCG/ACT nasal spray Place 2 sprays into both nostrils daily.    [name] gabapentin (NEURONTIN) 300 MG capsule 2 tabs 2 times per day 360 capsule 2  . glucose blood (ONE TOUCH ULTRA TEST) test strip Use to check blood sugar 1 time per day. 100 each 2  . HYDROcodone-acetaminophen (NORCO/VICODIN) 5-325 MG tablet Take 1 tablet by mouth every 6 (six) hours as needed for moderate pain.    [name] levothyroxine (SYNTHROID, LEVOTHROID) 112 MCG tablet Take 1 tablet (112 mcg total) by mouth daily. 90 tablet 3  . loratadine (CLARITIN) 10 MG tablet Take 10 mg by mouth daily.      . metFORMIN (GLUCOPHAGE-XR) 500 MG 24 hr tablet 2 tabs, twice a day 360 tablet 1  .  Multiple Vitamin (MULTIVITAMIN) tablet Take 1 tablet by mouth 2 (two) times daily.     . pantoprazole (PROTONIX) 40 MG tablet Take 1 tablet (40 mg total) by mouth daily. 90 tablet 2  . repaglinide (PRANDIN) 2 MG tablet Take 1 tablet (2 mg total) by mouth 3 (three) times daily before meals. 180 tablet 2  . simvastatin (ZOCOR) 40 MG tablet Take 1 tablet (40 mg total) by mouth at bedtime. 90 tablet 2  . terbinafine (LAMISIL) 250 MG tablet Take 1 tablet (250 mg total) by mouth daily. 90 tablet 0  . topiramate (TOPAMAX) 25 MG tablet Take 2 tablets (50 mg total) by mouth 2 (two) times daily. 360 tablet 2  . traMADol (ULTRAM) 50 MG tablet Take 1 tablet (50 mg total) by mouth every 6 (six) hours as needed. 100 tablet 2   No current facility-administered medications on file  prior to visit.    Allergies  Allergen Reactions  . Ace Inhibitors     REACTION: Cough  . Caffeine-Sodium Benzoate     REACTION: PVCs  . Nsaids     Gastric Bypass Surgery - unable to take    Family History  Problem Relation Age of Onset  . Diabetes Mother   . Diabetes Father   . Liver cancer Maternal Aunt     2 aunt    BP 98/60 mmHg  Pulse 84  Temp(Src) 98.1 F (36.7 C)  Ht 5\' 11"  (1.803 m)  Wt 189 lb 6.4 oz (85.911 kg)  BMI 26.43 kg/m2  SpO2 95%  Review of Systems No fever or change in leg swelling    Objective:   Physical Exam VITAL SIGNS:  See vs page GENERAL: no distress.  Skin: feet and distal legs: many rust colored macules, each only approx 1 mm diameter.  Does not involve the soles of the feet, or the area under straps of sandals.        Assessment & Plan:  Rash, new, uncertain etiology  Patient is advised the following: Patient Instructions  blood tests are requested for you today.  We'll let you know about the results.   Please see a dermatology specialist.  you will receive a phone call, about a day and time for an appointment.   Renato Shin, MD

## 2016-04-26 NOTE — Patient Instructions (Addendum)
blood tests are requested for you today.  We'll let you know about the results.   Please see a dermatology specialist.  you will receive a phone call, about a day and time for an appointment.

## 2016-04-30 ENCOUNTER — Ambulatory Visit: Payer: 59 | Admitting: Internal Medicine

## 2016-05-10 LAB — HM PAP SMEAR: HM Pap smear: NORMAL

## 2016-05-13 ENCOUNTER — Other Ambulatory Visit: Payer: Self-pay | Admitting: Endocrinology

## 2016-05-18 ENCOUNTER — Encounter: Payer: Self-pay | Admitting: Obstetrics and Gynecology

## 2016-06-06 ENCOUNTER — Other Ambulatory Visit: Payer: Self-pay | Admitting: Endocrinology

## 2016-06-30 ENCOUNTER — Other Ambulatory Visit: Payer: Self-pay | Admitting: Endocrinology

## 2016-08-24 ENCOUNTER — Other Ambulatory Visit: Payer: Self-pay | Admitting: Endocrinology

## 2016-08-30 ENCOUNTER — Telehealth: Payer: Self-pay | Admitting: Endocrinology

## 2016-08-30 NOTE — Telephone Encounter (Signed)
I don't have form.  I need 1, please

## 2016-08-30 NOTE — Telephone Encounter (Signed)
Patient need prior approval for surgery on her right shoulder, Dr Esmond Plants is waiting for ellison to send over fax. Please advise

## 2016-08-31 ENCOUNTER — Telehealth: Payer: Self-pay

## 2016-08-31 NOTE — Telephone Encounter (Signed)
[place] to get the form to clear patient for surgery, to place on Dr.Ellison's desk for him to sign.

## 2016-10-12 ENCOUNTER — Ambulatory Visit (INDEPENDENT_AMBULATORY_CARE_PROVIDER_SITE_OTHER): Payer: 59 | Admitting: Endocrinology

## 2016-10-12 VITALS — BP 124/74 | HR 74 | Wt 184.0 lb

## 2016-10-12 DIAGNOSIS — E118 Type 2 diabetes mellitus with unspecified complications: Secondary | ICD-10-CM | POA: Diagnosis not present

## 2016-10-12 DIAGNOSIS — R319 Hematuria, unspecified: Secondary | ICD-10-CM | POA: Diagnosis not present

## 2016-10-12 LAB — URINALYSIS, ROUTINE W REFLEX MICROSCOPIC
Bilirubin Urine: NEGATIVE
Hgb urine dipstick: NEGATIVE
Ketones, ur: NEGATIVE
Leukocytes, UA: NEGATIVE
Nitrite: POSITIVE — AB
Specific Gravity, Urine: 1.01 (ref 1.000–1.030)
Total Protein, Urine: NEGATIVE
Urine Glucose: >=1000 — AB
Urobilinogen, UA: 0.2 (ref 0.0–1.0)
WBC, UA: NONE SEEN (ref 0–?)
pH: 6 (ref 5.0–8.0)

## 2016-10-12 LAB — POCT GLYCOSYLATED HEMOGLOBIN (HGB A1C): Hemoglobin A1C: 7.1

## 2016-10-12 MED ORDER — HYDROCODONE-ACETAMINOPHEN 5-325 MG PO TABS: 1.0000 | ORAL_TABLET | Freq: Four times a day (QID) | ORAL | 0 refills | Status: DC | PRN

## 2016-10-12 MED ORDER — CEFUROXIME AXETIL 500 MG PO TABS: 500.0000 mg | ORAL_TABLET | Freq: Two times a day (BID) | ORAL | 0 refills | Status: DC

## 2016-10-12 MED ORDER — LINAGLIPTIN 5 MG PO TABS: 5.0000 mg | ORAL_TABLET | Freq: Every day | ORAL | 11 refills | Status: DC

## 2016-10-12 MED ORDER — TRAMADOL HCL 50 MG PO TABS: 50.0000 mg | ORAL_TABLET | Freq: Four times a day (QID) | ORAL | 2 refills | Status: DC | PRN

## 2016-10-12 NOTE — Progress Notes (Signed)
Subjective:    Patient ID: Savannah [name], female    DOB: [birth year], 60 y.o.   MRN: [id number]  HPI Pt returns for f/u of diabetes mellitus:  DM type: 2 Dx'ed: [id number] Complications: painful polyneuropathy Therapy: 3 oral meds GDM: never DKA: never Severe hypoglycemia: never Pancreatitis: never.  Other: she took insulin from 2004-2011, when she had gastric bypass surgery.   Interval history: she has few mos of intermittent pain at the left lumbar area.  No injury.   Past Medical History:  Diagnosis Date  . ABDOMINAL PAIN, CHRONIC 10/20/2009  . Acute cystitis 08/17/2009  . ALLERGIC RHINITIS 11/21/2007  . ASYMPTOMATIC POSTMENOPAUSAL STATUS 09/29/2008  . Bariatric surgery status 07/07/2010  . DEPRESSION 11/21/2007  . DIABETES MELLITUS, TYPE II 07/08/2007  . DYSPNEA 05/20/2009  . Edema 05/20/2009  . Eosinophilic esophagitis 0000000  . FEVER UNSPECIFIED 10/20/2009  . FEVER, HX OF 03/09/2010  . Headache(784.0) 02/06/2010  . Hepatomegaly 01/06/2008  . HYPERLIPIDEMIA 07/08/2007  . HYPERTENSION 07/08/2007  . LEUKOPENIA, MILD 09/29/2008  . OBESITY 01/06/2008  . OSTEOARTHRITIS 01/06/2008  . Other chronic nonalcoholic liver disease [id number]  . PERIPHERAL NEUROPATHY 11/21/2007  . Peripheral neuropathy ([place])   . Postsurgical hypothyroidism 09/19/2010  . SINUSITIS- ACUTE-NOS 11/21/2007  . TB SKIN TEST, POSITIVE 02/06/2010  . THYROID NODULE 03/09/2010    Past Surgical History:  Procedure Laterality Date  . ABDOMINAL HYSTERECTOMY    . OOPHORECTOMY    . Right total knee replacement    . SKIN TAG REMOVAL      Social History   Social History  . Marital status: Single    Spouse name: N/A  . Number of children: N/A  . Years of education: N/A   Occupational History  . Not on file.   Social History Main Topics  . Smoking status: Former Smoker    Packs/day: 1.50    Years: 15.00    Types: Cigarettes  . Smokeless tobacco: Former Systems developer    Quit date: 02/03/1991  . Alcohol use 0.0 oz/week     Comment:  rarely  . Drug use: No  . Sexual activity: Not on file   Other Topics Concern  . Not on file   Social History Narrative  . No narrative on file    Current Outpatient Prescriptions on File Prior to Visit  Medication Sig Dispense Refill  . Cholecalciferol (VITAMIN D3) 400 UNITS tablet Take 400 Units by mouth daily.      [name] docusate sodium (COLACE) 250 MG capsule Take 500 mg by mouth 2 (two) times daily.      . Flaxseed, Linseed, (FLAX SEED OIL PO) Take 1 capsule by mouth daily.      . fluticasone (FLONASE) 50 MCG/ACT nasal spray Place 2 sprays into both nostrils daily.    [name] gabapentin (NEURONTIN) 300 MG capsule 2 tabs 2 times per day 360 capsule 2  . glucose blood (ONE TOUCH ULTRA TEST) test strip Use to check blood sugar 1 time per day. 100 each 2  . INVOKANA 300 MG TABS tablet TAKE 1 TABLET DAILY BEFORE BREAKFAST 30 tablet 0  . levothyroxine (SYNTHROID, LEVOTHROID) 112 MCG tablet TAKE 1 TABLET BY MOUTH EVERY DAY 90 tablet 0  . loratadine (CLARITIN) 10 MG tablet Take 10 mg by mouth daily.      . metFORMIN (GLUCOPHAGE-XR) 500 MG 24 hr tablet TAKE 2 TABLETS TWICE A DAY 360 tablet 0  . Multiple Vitamin (MULTIVITAMIN) tablet Take 1 tablet by mouth 2 (two) times daily.     [name]  pantoprazole (PROTONIX) 40 MG tablet Take 1 tablet (40 mg total) by mouth daily. 90 tablet 2  . repaglinide (PRANDIN) 2 MG tablet Take 1 tablet (2 mg total) by mouth 3 (three) times daily before meals. 180 tablet 2  . simvastatin (ZOCOR) 40 MG tablet Take 1 tablet (40 mg total) by mouth at bedtime. 90 tablet 2  . terbinafine (LAMISIL) 250 MG tablet Take 1 tablet (250 mg total) by mouth daily. 90 tablet 0  . topiramate (TOPAMAX) 25 MG tablet Take 2 tablets (50 mg total) by mouth 2 (two) times daily. 360 tablet 2   No current facility-administered medications on file prior to visit.     Allergies  Allergen Reactions  . Ace Inhibitors     REACTION: Cough  . Caffeine-Sodium Benzoate     REACTION: PVCs  . Nsaids      Gastric Bypass Surgery - unable to take    Family History  Problem Relation Age of Onset  . Diabetes Mother   . Diabetes Father   . Liver cancer Maternal Aunt     2 aunt    BP 124/74   Pulse 74   Wt 184 lb (83.5 kg)   SpO2 97%   BMI 25.66 kg/m    Review of Systems Denies dysuria, but she has malodorous urine.  Chronic neuropathy pain is well-controlled.    Objective:   Physical Exam VITAL SIGNS:  See vs page GENERAL: no distress Pulses: dorsalis pedis intact bilat.   MSK: no deformity of the feet CV: trace right leg edema (1+ on the right). Skin:  no ulcer on the feet.  normal color and temp on the feet. Neuro: sensation is intact to touch on the feet   Lab Results  Component Value Date   HGBA1C 7.1 10/12/2016      Assessment & Plan:  Flank pain/bacteuria, new. I have sent a prescription to your pharmacy, for an antibiotic pill.  Type 2 DM, with polyneuropathy: she needs increased rx.   Chronic pain: well-controlled.  Patient is advised the following: Patient Instructions  A urine test is requested for you today.  We'll let you know about the results. I have sent a prescription to your pharmacy, to add "tradjenta." Please continue the same pain medications.   Please come back for a regular physical appointment in 3 months.

## 2016-10-12 NOTE — Patient Instructions (Addendum)
A urine test is requested for you today.  We'll let you know about the results. I have sent a prescription to your pharmacy, to add "tradjenta." Please continue the same pain medications.   Please come back for a regular physical appointment in 3 months.

## 2016-10-18 ENCOUNTER — Other Ambulatory Visit: Payer: Self-pay | Admitting: Endocrinology

## 2016-10-19 ENCOUNTER — Other Ambulatory Visit: Payer: Self-pay | Admitting: Endocrinology

## 2016-11-12 LAB — HM COLONOSCOPY

## 2016-11-16 ENCOUNTER — Other Ambulatory Visit: Payer: Self-pay | Admitting: Endocrinology

## 2016-11-16 ENCOUNTER — Ambulatory Visit: Payer: 59 | Admitting: Endocrinology

## 2016-11-16 ENCOUNTER — Ambulatory Visit (INDEPENDENT_AMBULATORY_CARE_PROVIDER_SITE_OTHER): Payer: 59 | Admitting: Endocrinology

## 2016-11-16 VITALS — BP 110/70 | HR 88 | Ht 71.0 in | Wt 177.0 lb

## 2016-11-16 DIAGNOSIS — E119 Type 2 diabetes mellitus without complications: Secondary | ICD-10-CM | POA: Diagnosis not present

## 2016-11-16 DIAGNOSIS — F32A Depression, unspecified: Secondary | ICD-10-CM

## 2016-11-16 DIAGNOSIS — K219 Gastro-esophageal reflux disease without esophagitis: Secondary | ICD-10-CM | POA: Insufficient documentation

## 2016-11-16 DIAGNOSIS — Z9884 Bariatric surgery status: Secondary | ICD-10-CM | POA: Insufficient documentation

## 2016-11-16 DIAGNOSIS — R634 Abnormal weight loss: Secondary | ICD-10-CM

## 2016-11-16 DIAGNOSIS — F329 Major depressive disorder, single episode, unspecified: Secondary | ICD-10-CM | POA: Diagnosis not present

## 2016-11-16 DIAGNOSIS — E1142 Type 2 diabetes mellitus with diabetic polyneuropathy: Secondary | ICD-10-CM | POA: Diagnosis not present

## 2016-11-16 DIAGNOSIS — L900 Lichen sclerosus et atrophicus: Secondary | ICD-10-CM | POA: Insufficient documentation

## 2016-11-16 DIAGNOSIS — E785 Hyperlipidemia, unspecified: Secondary | ICD-10-CM | POA: Insufficient documentation

## 2016-11-16 DIAGNOSIS — F339 Major depressive disorder, recurrent, unspecified: Secondary | ICD-10-CM | POA: Insufficient documentation

## 2016-11-16 DIAGNOSIS — R1032 Left lower quadrant pain: Secondary | ICD-10-CM | POA: Insufficient documentation

## 2016-11-16 MED ORDER — AMOXICILLIN-POT CLAVULANATE 875-125 MG PO TABS: 1.0000 | ORAL_TABLET | Freq: Two times a day (BID) | ORAL | 0 refills | Status: AC

## 2016-11-16 NOTE — Progress Notes (Signed)
Subjective:    Patient ID: Savannah [name], female    DOB: [birth year], 61 y.o.   MRN: [id number]  HPI Pt returns for f/u of diabetes mellitus:  DM type: 2 Dx'ed: [id number] Complications: painful polyneuropathy Therapy: 3 oral meds GDM: never DKA: never Severe hypoglycemia: never.   Pancreatitis: never.  Other: she took insulin from 2004-2011, when she had gastric bypass surgery.   Interval history: she has 2 weeks of moderate cough in the chest, and assoc nasal congestion.  She finished zithromax 3 days ago.  She feels slightly better today.   Past Medical History:  Diagnosis Date  . ABDOMINAL PAIN, CHRONIC 10/20/2009  . Acute cystitis 08/17/2009  . ALLERGIC RHINITIS 11/21/2007  . ASYMPTOMATIC POSTMENOPAUSAL STATUS 09/29/2008  . Bariatric surgery status 07/07/2010  . DEPRESSION 11/21/2007  . DIABETES MELLITUS, TYPE II 07/08/2007  . DYSPNEA 05/20/2009  . Edema 05/20/2009  . Eosinophilic esophagitis 0000000  . FEVER UNSPECIFIED 10/20/2009  . FEVER, HX OF 03/09/2010  . Headache(784.0) 02/06/2010  . Hepatomegaly 01/06/2008  . HYPERLIPIDEMIA 07/08/2007  . HYPERTENSION 07/08/2007  . LEUKOPENIA, MILD 09/29/2008  . OBESITY 01/06/2008  . OSTEOARTHRITIS 01/06/2008  . Other chronic nonalcoholic liver disease [id number]  . PERIPHERAL NEUROPATHY 11/21/2007  . Peripheral neuropathy ([place])   . Postsurgical hypothyroidism 09/19/2010  . SINUSITIS- ACUTE-NOS 11/21/2007  . TB SKIN TEST, POSITIVE 02/06/2010  . THYROID NODULE 03/09/2010    Past Surgical History:  Procedure Laterality Date  . ABDOMINAL HYSTERECTOMY    . OOPHORECTOMY    . Right total knee replacement    . SKIN TAG REMOVAL      Social History   Social History  . Marital status: Single    Spouse name: N/A  . Number of children: N/A  . Years of education: N/A   Occupational History  . Not on file.   Social History Main Topics  . Smoking status: Former Smoker    Packs/day: 1.50    Years: 15.00    Types: Cigarettes  . Smokeless tobacco:  Former Systems developer    Quit date: 02/03/1991  . Alcohol use 0.0 oz/week     Comment: rarely  . Drug use: No  . Sexual activity: Not on file   Other Topics Concern  . Not on file   Social History Narrative  . No narrative on file    Current Outpatient Prescriptions on File Prior to Visit  Medication Sig Dispense Refill  . Cholecalciferol (VITAMIN D3) 400 UNITS tablet Take 400 Units by mouth daily.      [name] docusate sodium (COLACE) 250 MG capsule Take 500 mg by mouth 2 (two) times daily.      . Flaxseed, Linseed, (FLAX SEED OIL PO) Take 1 capsule by mouth daily.      . fluticasone (FLONASE) 50 MCG/ACT nasal spray Place 2 sprays into both nostrils daily.    [name] gabapentin (NEURONTIN) 300 MG capsule TAKE 2 CAPSULES BY MOUTH TWICE A DAY 360 capsule 2  . HYDROcodone-acetaminophen (NORCO/VICODIN) 5-325 MG tablet Take 1 tablet by mouth every 6 (six) hours as needed for moderate pain. 50 tablet 0  . INVOKANA 300 MG TABS tablet TAKE 1 TABLET DAILY BEFORE BREAKFAST 30 tablet 0  . levothyroxine (SYNTHROID, LEVOTHROID) 112 MCG tablet TAKE 1 TABLET BY MOUTH EVERY DAY 90 tablet 0  . linagliptin (TRADJENTA) 5 MG TABS tablet Take 1 tablet (5 mg total) by mouth daily. 30 tablet 11  . metFORMIN (GLUCOPHAGE-XR) 500 MG 24 hr tablet TAKE 2 TABLETS  TWICE A DAY 360 tablet 0  . Multiple Vitamin (MULTIVITAMIN) tablet Take 1 tablet by mouth 2 (two) times daily.     . ONE TOUCH ULTRA TEST test strip USE TO CHECK BLOOD SUGAR 1 TIME PER DAY. 100 each 3  . pantoprazole (PROTONIX) 40 MG tablet Take 1 tablet (40 mg total) by mouth daily. 90 tablet 2  . repaglinide (PRANDIN) 2 MG tablet Take 1 tablet (2 mg total) by mouth 3 (three) times daily before meals. 180 tablet 2  . simvastatin (ZOCOR) 40 MG tablet Take 1 tablet (40 mg total) by mouth at bedtime. 90 tablet 2  . terbinafine (LAMISIL) 250 MG tablet Take 1 tablet (250 mg total) by mouth daily. 90 tablet 0  . topiramate (TOPAMAX) 25 MG tablet Take 2 tablets (50 mg total) by  mouth 2 (two) times daily. 360 tablet 2  . traMADol (ULTRAM) 50 MG tablet Take 1 tablet (50 mg total) by mouth every 6 (six) hours as needed. 100 tablet 2   No current facility-administered medications on file prior to visit.     Allergies  Allergen Reactions  . Ace Inhibitors     REACTION: Cough  . Caffeine-Sodium Benzoate     REACTION: PVCs  . Ciprofloxacin Itching  . Nsaids     Gastric Bypass Surgery - unable to take    Family History  Problem Relation Age of Onset  . Diabetes Mother   . Diabetes Father   . Liver cancer Maternal Aunt     2 aunt    BP 110/70   Pulse 88   Ht 5\' 11"  (1.803 m)   Wt 177 lb (80.3 kg)   SpO2 97%   BMI 24.69 kg/m    Review of Systems She has lost 7 lbs since last ov, despite not trying to lose.  She has persistent depression, but no SI (lexapro, cymbalta, and wellbutrin did not help in the past).  topamax helps chronic low back pain.      Objective:   Physical Exam VITAL SIGNS:  See vs page GENERAL: no distress head: no deformity  eyes: no periorbital swelling, no proptosis.  external nose and ears are normal  mouth: no lesion seen Both both tm's are red.   LUNGS:  Clear to auscultation.    (pt says cxr 1 week ago at [facility] was normal)     Assessment & Plan:  Acute bronchitis, persistent AOM, bilat, new. Weight loss: does not seem completely explained by gastric bypass. Depression, slightly worse.  Patient is advised the following: Patient Instructions  I have sent a prescription to your pharmacy, for a different antibiotic pill.   Loratadine-d (non-prescription) will help your congestion.   Please see GI and psychology specialists.  you will receive a phone call, about days and times for appointments.

## 2016-11-16 NOTE — Patient Instructions (Addendum)
I have sent a prescription to your pharmacy, for a different antibiotic pill.   Loratadine-d (non-prescription) will help your congestion.   Please see GI and psychology specialists.  you will receive a phone call, about days and times for appointments.

## 2016-11-30 DIAGNOSIS — E1142 Type 2 diabetes mellitus with diabetic polyneuropathy: Secondary | ICD-10-CM | POA: Diagnosis not present

## 2016-11-30 DIAGNOSIS — Z713 Dietary counseling and surveillance: Secondary | ICD-10-CM | POA: Diagnosis not present

## 2016-11-30 DIAGNOSIS — E119 Type 2 diabetes mellitus without complications: Secondary | ICD-10-CM | POA: Diagnosis not present

## 2016-12-04 ENCOUNTER — Encounter: Payer: Self-pay | Admitting: Physician Assistant

## 2016-12-07 DIAGNOSIS — B372 Candidiasis of skin and nail: Secondary | ICD-10-CM | POA: Diagnosis not present

## 2016-12-07 DIAGNOSIS — K912 Postsurgical malabsorption, not elsewhere classified: Secondary | ICD-10-CM | POA: Diagnosis not present

## 2016-12-07 DIAGNOSIS — E119 Type 2 diabetes mellitus without complications: Secondary | ICD-10-CM | POA: Diagnosis not present

## 2016-12-07 DIAGNOSIS — R1032 Left lower quadrant pain: Secondary | ICD-10-CM | POA: Diagnosis not present

## 2016-12-07 DIAGNOSIS — L900 Lichen sclerosus et atrophicus: Secondary | ICD-10-CM | POA: Diagnosis not present

## 2016-12-14 ENCOUNTER — Ambulatory Visit (INDEPENDENT_AMBULATORY_CARE_PROVIDER_SITE_OTHER): Payer: 59 | Admitting: Physician Assistant

## 2016-12-14 ENCOUNTER — Encounter: Payer: Self-pay | Admitting: Physician Assistant

## 2016-12-14 VITALS — BP 120/60 | HR 82 | Ht 71.0 in | Wt 175.0 lb

## 2016-12-14 DIAGNOSIS — R634 Abnormal weight loss: Secondary | ICD-10-CM | POA: Diagnosis not present

## 2016-12-14 DIAGNOSIS — R109 Unspecified abdominal pain: Secondary | ICD-10-CM | POA: Diagnosis not present

## 2016-12-14 DIAGNOSIS — F5000 Anorexia nervosa, unspecified: Secondary | ICD-10-CM | POA: Diagnosis not present

## 2016-12-14 DIAGNOSIS — R194 Change in bowel habit: Secondary | ICD-10-CM | POA: Diagnosis not present

## 2016-12-14 MED ORDER — NA SULFATE-K SULFATE-MG SULF 17.5-3.13-1.6 GM/177ML PO SOLN: 1.0000 | Freq: Once | ORAL | 0 refills | Status: AC

## 2016-12-14 NOTE — Progress Notes (Signed)
Subjective:    Patient ID: Savannah [name], female    DOB: 01/07/1956, 61 y.o.   MRN: [id number]  HPI Savannah [name] is a 61 year old white female, referred today by Dr. Loanne Drilling. She had seen Dr. Sharlett Iles remotely. She is referred today for weight loss and left side/flank discomfort. Patient has history of adult-onset diabetes mellitus, Karlene [name], sleep apnea, uterine cancer for which she is status post hysterectomy. She had undergone EGD and colonoscopy in 2009 at [facility] per a Dr. Aleene Davidson evaluation of iron deficiency anemia. Colonoscopy was normal and EGD showed findings consistent with eosinophilic esophagitis. She had a ringed esophagus with longitudinal furrows in the upper and middle esophagus and multiple small sessile polyps in the gastric fundus. She says she was treated with fluticasone with good relief of symptoms. She underwent gastric bypass surgery per a Dr. Norris Cross in [place] in 2011. This was a Roux-en-Y. She says she lost from 303 pounds 285 pounds within the first year. She gained a little bit of weight after that reading in the low 200s. Over the past year or so she had lost down to 184 pounds. She weighs 175 pounds today. She has had a significant decrease in her appetite over the past couple of months and says that food just doesn't taste good. She feels she may be eating less than usual but is not intentionally trying to lose any weight. She is focusing on increasing protein etc. at this point. She has not had any nausea or vomiting, no dysphagia she has noticed some left flank discomfort off and on over the past few months as well. No melena or hematochezia, stools tend to be constipated and she uses stool softeners with good success. She also has noticed some recent intermittent lower chest discomfort which reminds her of her previous symptoms with esophagitis. He is on multiple medications which potentially may affect her appetite but says none of these are new. She has been  on Topamax for a couple of years, has been on Invokana now over the past year or so and test recently started to tragenta. She was seen by her bypass surgeon Dr. Volanda Napoleon is now with no vomiting current or cell within the past couple of weeks for the same issues and is set up for CT scan of the abdomen and pelvis on 12/21/2016. She was  told to follow up with GI.  Review of Systems Pertinent positive and negative review of systems were noted in the above HPI section.  All other review of systems was otherwise negative.  Outpatient Encounter Prescriptions as of 12/14/2016  Medication Sig  . Cholecalciferol (VITAMIN D3) 400 UNITS tablet Take 400 Units by mouth daily.    [name] docusate sodium (COLACE) 250 MG capsule Take 500 mg by mouth 2 (two) times daily.    . Flaxseed, Linseed, (FLAX SEED OIL PO) Take 1 capsule by mouth daily.    . fluticasone (FLONASE) 50 MCG/ACT nasal spray Place 2 sprays into both nostrils daily.  [name] gabapentin (NEURONTIN) 300 MG capsule TAKE 2 CAPSULES BY MOUTH TWICE A DAY  . HYDROcodone-acetaminophen (NORCO/VICODIN) 5-325 MG tablet Take 1 tablet by mouth every 6 (six) hours as needed for moderate pain.  . hydrOXYzine (ATARAX/VISTARIL) 25 MG tablet Take 25 mg by mouth 3 (three) times daily as needed.  . INVOKANA 300 MG TABS tablet TAKE 1 TABLET DAILY BEFORE BREAKFAST  . levocetirizine (XYZAL) 5 MG tablet Take 5 mg by mouth every evening.  [name] levothyroxine (SYNTHROID,  LEVOTHROID) 112 MCG tablet TAKE 1 TABLET BY MOUTH EVERY DAY  . linagliptin (TRADJENTA) 5 MG TABS tablet Take 1 tablet (5 mg total) by mouth daily.  . metFORMIN (GLUCOPHAGE-XR) 500 MG 24 hr tablet TAKE 2 TABLETS TWICE A DAY  . Multiple Vitamin (MULTIVITAMIN) tablet Take 1 tablet by mouth 2 (two) times daily.   . ONE TOUCH ULTRA TEST test strip USE TO CHECK BLOOD SUGAR 1 TIME PER DAY.  . pantoprazole (PROTONIX) 40 MG tablet Take 1 tablet (40 mg total) by mouth daily.  . repaglinide (PRANDIN) 2 MG tablet Take 1 tablet (2 mg  total) by mouth 3 (three) times daily before meals.  . simvastatin (ZOCOR) 40 MG tablet Take 1 tablet (40 mg total) by mouth at bedtime.  . topiramate (TOPAMAX) 25 MG tablet Take 2 tablets (50 mg total) by mouth 2 (two) times daily.  . traMADol (ULTRAM) 50 MG tablet Take 1 tablet (50 mg total) by mouth every 6 (six) hours as needed.  . Na Sulfate-K Sulfate-Mg Sulf 17.5-3.13-1.6 GM/180ML SOLN Take 1 kit by mouth once.  . [DISCONTINUED] terbinafine (LAMISIL) 250 MG tablet Take 1 tablet (250 mg total) by mouth daily.   No facility-administered encounter medications on file as of 12/14/2016.    Allergies  Allergen Reactions  . Ace Inhibitors     REACTION: Cough  . Caffeine-Sodium Benzoate     REACTION: PVCs  . Ciprofloxacin Itching  . Nsaids     Gastric Bypass Surgery - unable to take   Patient Active Problem List   Diagnosis Date Noted  . Loss of weight 11/16/2016  . Hematuria 10/12/2016  . Rash and nonspecific skin eruption 04/26/2016  . Wellness examination 10/14/2015  . Rectal bleeding 10/14/2015  . Pain in joint, shoulder region 10/14/2015  . Sleep disorder 10/14/2015  . Contusion of toe of left foot 06/25/2014  . Multiple skin nodules 06/25/2014  . Menopausal state 06/25/2014  . Nose abnormality 01/30/2013  . Myalgia and myositis 01/30/2013  . Encounter for long-term (current) use of other medications 01/04/2012  . Uterine cancer ([place]) 01/04/2012  . POSTSURGICAL HYPOTHYROIDISM 09/19/2010  . BARIATRIC SURGERY STATUS 07/07/2010  . INGROWN TOENAIL 04/07/2010  . THYROID NODULE 03/09/2010  . FEVER, HX OF 03/09/2010  . HEADACHE 02/06/2010  . PPD positive 02/06/2010  . FEVER UNSPECIFIED 10/20/2009  . ABDOMINAL PAIN, CHRONIC 10/20/2009  . PYURIA 10/20/2009  . ACUTE CYSTITIS 08/17/2009  . EDEMA 05/20/2009  . WEIGHT GAIN 05/20/2009  . DYSPNEA 05/20/2009  . LEUKOPENIA, MILD 09/29/2008  . ASYMPTOMATIC POSTMENOPAUSAL STATUS 09/29/2008  . EOSINOPHILIC ESOPHAGITIS 62/70/3500  .  OBESITY 01/06/2008  . NASH (nonalcoholic steatohepatitis) 01/06/2008  . OSTEOARTHRITIS 01/06/2008  . HEPATOMEGALY 01/06/2008  . Depression 11/21/2007  . PERIPHERAL NEUROPATHY 11/21/2007  . SINUSITIS- ACUTE-NOS 11/21/2007  . ALLERGIC RHINITIS 11/21/2007  . Diabetes ([place]) 07/08/2007  . HYPERLIPIDEMIA 07/08/2007  . Essential hypertension 07/08/2007   Social History   Social History  . Marital status: Single    Spouse name: N/A  . Number of children: 0  . Years of education: N/A   Occupational History  . consultant    Social History Main Topics  . Smoking status: Former Smoker    Packs/day: 1.50    Years: 15.00    Types: Cigarettes  . Smokeless tobacco: Never Used  . Alcohol use 0.0 oz/week     Comment: rarely  . Drug use: No  . Sexual activity: Not on file   Other Topics Concern  . Not  on file   Social History Narrative  . No narrative on file    Ms. Falletta's family history includes Colon cancer in her maternal uncle; Diabetes in her father and mother; Liver cancer in her maternal aunt.      Objective:    Vitals:   12/14/16 1404  BP: 120/60  Pulse: 82    Physical Exam  well-developed older white female in no acute distress, blood pressure 120/60 pulse 82 height 511, weight 175 BMI 24.4. HEENT; nontraumatic normocephalic EOMI PERRLA sclera anicteric, Cardiovascular ;regular rate and rhythm with S1-S2 no murmur or gallop, Pulmonary; clear bilaterally, Abdomen; soft, nondistended bowel sounds are present no palpable mass or hepatosplenomegaly, Rectal ;exam not done, Ext; no clubbing cyanosis or edema skin warm dry, Neuropsych ;mood and affect appropriate       Assessment & Plan:   #69 61 year old white female status post Roux-en-Y gastric bypass 2011 with persistent weight loss over the past year to year and a half, unintentional. She is down about 25 pounds. Etiology of weight loss is unclear, she is also had a significant decrease in appetite. Rule out  underlying malignancy, Patient is also diabetic and on several medications, weight loss may be in part secondary to diabetic meds. She is also on Topamax which is associated with weight loss but says she has been on this for the past couple of years Weight loss may be multifactorial as outlined above #2 history of eosinophilic esophagitis-patient with recent mild recurrent reflux type symptoms #3 adult-onset diabetes mellitus #4 Karlene [name] #5 sleep apnea #6 history of uterine CA status post hysterectomy  Plan; Patient is already scheduled for CT of the abdomen and pelvis on 12/21/2016 was ordered by Dr. Norris Cross , [facility] Will schedule for colonoscopy and EGD with Dr. Havery Moros. Both procedures discussed in detail with the patient including risks and benefits and she is agreeable to proceed.     Amy S Esterwood PA-C 12/14/2016   Cc: Renato Shin, MD

## 2016-12-14 NOTE — Patient Instructions (Addendum)
You have been scheduled for an endoscopy and colonoscopy. Please follow the written instructions given to you at your visit today. Please pick up your prep supplies at the pharmacy within the next 1-3 days. [facility] [address] If you use inhalers (even only as needed), please bring them with you on the day of your procedure. Your physician has requested that you go to www.[email or web address].com and enter the access code given to you at your visit today. This web site gives a general overview about your procedure. However, you should still follow specific instructions given to you by our office regarding your preparation for the procedure.

## 2016-12-17 NOTE — Progress Notes (Signed)
Agree with assessment and plan as outlined.  

## 2016-12-21 DIAGNOSIS — R911 Solitary pulmonary nodule: Secondary | ICD-10-CM | POA: Diagnosis not present

## 2016-12-21 DIAGNOSIS — N281 Cyst of kidney, acquired: Secondary | ICD-10-CM | POA: Diagnosis not present

## 2016-12-21 DIAGNOSIS — K5900 Constipation, unspecified: Secondary | ICD-10-CM | POA: Diagnosis not present

## 2016-12-21 DIAGNOSIS — R932 Abnormal findings on diagnostic imaging of liver and biliary tract: Secondary | ICD-10-CM | POA: Diagnosis not present

## 2017-01-09 ENCOUNTER — Other Ambulatory Visit: Payer: Self-pay | Admitting: Endocrinology

## 2017-01-11 ENCOUNTER — Encounter: Payer: Self-pay | Admitting: Endocrinology

## 2017-01-11 ENCOUNTER — Ambulatory Visit (INDEPENDENT_AMBULATORY_CARE_PROVIDER_SITE_OTHER): Payer: 59 | Admitting: Endocrinology

## 2017-01-11 VITALS — BP 112/70 | HR 89 | Ht 71.0 in | Wt 175.0 lb

## 2017-01-11 DIAGNOSIS — E119 Type 2 diabetes mellitus without complications: Secondary | ICD-10-CM

## 2017-01-11 DIAGNOSIS — Z794 Long term (current) use of insulin: Secondary | ICD-10-CM

## 2017-01-11 DIAGNOSIS — E1142 Type 2 diabetes mellitus with diabetic polyneuropathy: Secondary | ICD-10-CM | POA: Diagnosis not present

## 2017-01-11 DIAGNOSIS — K912 Postsurgical malabsorption, not elsewhere classified: Secondary | ICD-10-CM | POA: Diagnosis not present

## 2017-01-11 DIAGNOSIS — R1032 Left lower quadrant pain: Secondary | ICD-10-CM | POA: Diagnosis not present

## 2017-01-11 DIAGNOSIS — Z0000 Encounter for general adult medical examination without abnormal findings: Secondary | ICD-10-CM

## 2017-01-11 LAB — URINALYSIS, ROUTINE W REFLEX MICROSCOPIC
Bilirubin Urine: NEGATIVE
Hgb urine dipstick: NEGATIVE
Ketones, ur: NEGATIVE
Leukocytes, UA: NEGATIVE
Nitrite: NEGATIVE
RBC / HPF: NONE SEEN (ref 0–?)
Specific Gravity, Urine: 1.01 (ref 1.000–1.030)
Total Protein, Urine: NEGATIVE
Urine Glucose: >=1000 — AB
Urobilinogen, UA: 0.2 (ref 0.0–1.0)
WBC, UA: NONE SEEN (ref 0–?)
pH: 6 (ref 5.0–8.0)

## 2017-01-11 LAB — LIPID PANEL
Cholesterol: 169 mg/dL (ref 0–200)
HDL: 46.8 mg/dL (ref >39.00)
LDL Cholesterol: 99 mg/dL (ref 0–99)
NonHDL: 122.17
Total CHOL/HDL Ratio: 4
Triglycerides: 114 mg/dL (ref 0.0–149.0)
VLDL: 22.8 mg/dL (ref 0.0–40.0)

## 2017-01-11 LAB — MICROALBUMIN / CREATININE URINE RATIO
Creatinine,U: 71.2 mg/dL
Microalb Creat Ratio: 1 mg/g (ref 0.0–30.0)
Microalb, Ur: <0.7 mg/dL (ref 0.0–1.9)

## 2017-01-11 LAB — HEPATIC FUNCTION PANEL
ALT: 25 U/L (ref 0–35)
AST: 20 U/L (ref 0–37)
Albumin: 3.9 g/dL (ref 3.5–5.2)
Alkaline Phosphatase: 79 U/L (ref 39–117)
Bilirubin, Direct: 0.1 mg/dL (ref 0.0–0.3)
Total Bilirubin: 0.5 mg/dL (ref 0.2–1.2)
Total Protein: 6.4 g/dL (ref 6.0–8.3)

## 2017-01-11 LAB — TSH: TSH: 5.38 u[IU]/mL — ABNORMAL HIGH (ref 0.35–4.50)

## 2017-01-11 LAB — POCT GLYCOSYLATED HEMOGLOBIN (HGB A1C): Hemoglobin A1C: 6.6

## 2017-01-11 MED ORDER — LEVOTHYROXINE SODIUM 125 MCG PO TABS: 125.0000 ug | ORAL_TABLET | Freq: Every day | ORAL | 3 refills | Status: DC

## 2017-01-11 NOTE — Progress Notes (Signed)
Subjective:    Patient ID: Savannah [name], female    DOB: 11-26-1955, 61 y.o.   MRN: [id number]  HPI Pt is here for regular wellness examination, and is feeling pretty well in general, and says chronic med probs are stable, except as noted below Past Medical History:  Diagnosis Date  . ABDOMINAL PAIN, CHRONIC 10/20/2009  . Acute cystitis 08/17/2009  . ALLERGIC RHINITIS 11/21/2007  . ASYMPTOMATIC POSTMENOPAUSAL STATUS 09/29/2008  . Bariatric surgery status 07/07/2010  . DEPRESSION 11/21/2007  . DIABETES MELLITUS, TYPE II 07/08/2007  . DYSPNEA 05/20/2009  . Edema 05/20/2009  . Eosinophilic esophagitis 0000000  . FEVER UNSPECIFIED 10/20/2009  . FEVER, HX OF 03/09/2010  . Headache(784.0) 02/06/2010  . Hepatomegaly 01/06/2008  . HYPERLIPIDEMIA 07/08/2007  . HYPERTENSION 07/08/2007  . LEUKOPENIA, MILD 09/29/2008  . OBESITY 01/06/2008  . OSTEOARTHRITIS 01/06/2008  . Other chronic nonalcoholic liver disease [id number]  . PERIPHERAL NEUROPATHY 11/21/2007  . Peripheral neuropathy ([place])   . Postsurgical hypothyroidism 09/19/2010  . SINUSITIS- ACUTE-NOS 11/21/2007  . TB SKIN TEST, POSITIVE 02/06/2010  . THYROID NODULE 03/09/2010    Past Surgical History:  Procedure Laterality Date  . ABDOMINAL HYSTERECTOMY    . KNEE ARTHROSCOPY Left   . OOPHORECTOMY    . Right total knee replacement    . ROUX-EN-Y GASTRIC BYPASS  2011  . SKIN TAG REMOVAL    . THYROIDECTOMY      Social History   Social History  . Marital status: Single    Spouse name: N/A  . Number of children: 0  . Years of education: N/A   Occupational History  . consultant    Social History Main Topics  . Smoking status: Former Smoker    Packs/day: 1.50    Years: 15.00    Types: Cigarettes  . Smokeless tobacco: Never Used  . Alcohol use 0.0 oz/week     Comment: rarely  . Drug use: No  . Sexual activity: Not on file   Other Topics Concern  . Not on file   Social History Narrative  . No narrative on file    Current Outpatient  Prescriptions on File Prior to Visit  Medication Sig Dispense Refill  . Cholecalciferol (VITAMIN D3) 400 UNITS tablet Take 400 Units by mouth daily.      [name] docusate sodium (COLACE) 250 MG capsule Take 500 mg by mouth 2 (two) times daily.      . Flaxseed, Linseed, (FLAX SEED OIL PO) Take 1 capsule by mouth daily.      . fluticasone (FLONASE) 50 MCG/ACT nasal spray Place 2 sprays into both nostrils daily.    [name] gabapentin (NEURONTIN) 300 MG capsule TAKE 2 CAPSULES BY MOUTH TWICE A DAY 360 capsule 2  . HYDROcodone-acetaminophen (NORCO/VICODIN) 5-325 MG tablet Take 1 tablet by mouth every 6 (six) hours as needed for moderate pain. 50 tablet 0  . hydrOXYzine (ATARAX/VISTARIL) 25 MG tablet Take 25 mg by mouth 3 (three) times daily as needed.    . INVOKANA 300 MG TABS tablet TAKE 1 TABLET DAILY BEFORE BREAKFAST 30 tablet 1  . levocetirizine (XYZAL) 5 MG tablet Take 5 mg by mouth every evening.    . linagliptin (TRADJENTA) 5 MG TABS tablet Take 1 tablet (5 mg total) by mouth daily. 30 tablet 11  . metFORMIN (GLUCOPHAGE-XR) 500 MG 24 hr tablet TAKE 2 TABLETS TWICE A DAY 360 tablet 0  . Multiple Vitamin (MULTIVITAMIN) tablet Take 1 tablet by mouth 2 (two) times daily.     [name]  ONE TOUCH ULTRA TEST test strip USE TO CHECK BLOOD SUGAR 1 TIME PER DAY. 100 each 3  . pantoprazole (PROTONIX) 40 MG tablet Take 1 tablet (40 mg total) by mouth daily. 90 tablet 2  . repaglinide (PRANDIN) 2 MG tablet Take 1 tablet (2 mg total) by mouth 3 (three) times daily before meals. 180 tablet 2  . simvastatin (ZOCOR) 40 MG tablet Take 1 tablet (40 mg total) by mouth at bedtime. 90 tablet 2  . topiramate (TOPAMAX) 25 MG tablet Take 2 tablets (50 mg total) by mouth 2 (two) times daily. 360 tablet 2  . traMADol (ULTRAM) 50 MG tablet Take 1 tablet (50 mg total) by mouth every 6 (six) hours as needed. 100 tablet 2   No current facility-administered medications on file prior to visit.     Allergies  Allergen Reactions  . Ace  Inhibitors     REACTION: Cough  . Caffeine-Sodium Benzoate     REACTION: PVCs  . Ciprofloxacin Itching  . Nsaids     Gastric Bypass Surgery - unable to take    Family History  Problem Relation Age of Onset  . Diabetes Mother   . Diabetes Father   . Liver cancer Maternal Aunt     2 aunt  . Colon cancer Maternal Uncle     BP 112/70   Pulse 89   Ht 5\' 11"  (1.803 m)   Wt 175 lb (79.4 kg)   SpO2 97%   BMI 24.41 kg/m    Review of Systems Constitutional: Negative for fever.  HENT: Negative for hearing loss.   Eyes: Negative for visual disturbance.  Respiratory: Negative for shortness of breath.   Cardiovascular: Negative for chest pain.  Gastrointestinal: no change in chronic intermittent BRBPR.  Endocrine: Positive for cold intolerance.  Genitourinary: Negative for hematuria.  Musculoskeletal: Positive for chronic back pain.  Skin: Negative for rash.  Allergic/Immunologic: Positive for environmental allergies.  Neurological: Negative for syncope and headaches.  Hematological: Bruises/bleeds easily.  Psychiatric/Behavioral: Negative for dysphoric mood.     Objective:   Physical Exam VS: see vs page GEN: no distress HEAD: head: no deformity eyes: no periorbital swelling, no proptosis external nose and ears are normal mouth: no lesion seen NECK: a healed scar is present.  i do not appreciate a nodule in the thyroid or elsewhere in the neck CHEST WALL: no deformity LUNGS:  Clear to auscultation BREASTS: sees gyn CV: reg rate and rhythm, no murmur.  ABD: abdomen is soft, nontender.  no hepatosplenomegaly.  not distended.  no hernia.  Several old healed surgical scars. GENITALIA/RECTAL: sees gyn.  MUSCULOSKELETAL: muscle bulk and strength are grossly normal.  no obvious joint swelling.  gait is normal and steady PULSES: no carotid bruit NEURO:  cn 2-12 grossly intact.   readily moves all 4's.  SKIN:  Normal texture and temperature.  No rash or suspicious lesion is  visible.   NODES:  None palpable at the neck PSYCH: alert, well-oriented.  Does not appear anxious nor depressed.  I personally reviewed electrocardiogram tracing (today): Indication: DM Impression: NSR.  No MI.  No hypertrophy. Compared to 2016: no change      Assessment & Plan:  Wellness visit today, with problems stable, except as noted. Patient is advised the following: Patient Instructions  Please consider these measures for your health:  minimize alcohol.  Do not use tobacco products.  Have a colonoscopy at least every 10 years from age 31.  Women should have an  annual mammogram from age 6.  Keep firearms safely stored.  Always use seat belts.  have working smoke alarms in your home.  See an eye doctor and dentist regularly.  Never drive under the influence of alcohol or drugs (including prescription drugs).  Those with fair skin should take precautions against the sun, and should carefully examine their skin once per month, for any new or changed moles. blood tests are requested for you today.  We'll let you know about the results.  Please come back for a follow-up appointment in 4-6 months.

## 2017-01-11 NOTE — Patient Instructions (Signed)
Please consider these measures for your health:  minimize alcohol.  Do not use tobacco products.  Have a colonoscopy at least every 10 years from age 63.  Women should have an annual mammogram from age 75.  Keep firearms safely stored.  Always use seat belts.  have working smoke alarms in your home.  See an eye doctor and dentist regularly.  Never drive under the influence of alcohol or drugs (including prescription drugs).  Those with fair skin should take precautions against the sun, and should carefully examine their skin once per month, for any new or changed moles. blood tests are requested for you today.  We'll let you know about the results.  Please come back for a follow-up appointment in 4-6 months.

## 2017-01-18 ENCOUNTER — Encounter: Payer: Self-pay | Admitting: Gastroenterology

## 2017-01-31 ENCOUNTER — Ambulatory Visit (AMBULATORY_SURGERY_CENTER): Payer: 59 | Admitting: Gastroenterology

## 2017-01-31 ENCOUNTER — Encounter: Payer: Self-pay | Admitting: Gastroenterology

## 2017-01-31 VITALS — BP 130/66 | HR 66 | Temp 98.0°F | Resp 10 | Ht 71.0 in | Wt 175.0 lb

## 2017-01-31 DIAGNOSIS — K317 Polyp of stomach and duodenum: Secondary | ICD-10-CM | POA: Diagnosis not present

## 2017-01-31 DIAGNOSIS — R634 Abnormal weight loss: Secondary | ICD-10-CM | POA: Diagnosis not present

## 2017-01-31 DIAGNOSIS — D125 Benign neoplasm of sigmoid colon: Secondary | ICD-10-CM | POA: Diagnosis not present

## 2017-01-31 DIAGNOSIS — K921 Melena: Secondary | ICD-10-CM

## 2017-01-31 DIAGNOSIS — K2950 Unspecified chronic gastritis without bleeding: Secondary | ICD-10-CM | POA: Diagnosis not present

## 2017-01-31 LAB — HM COLONOSCOPY

## 2017-01-31 MED ORDER — SUCRALFATE 1 GM/10ML PO SUSP: 1.0000 g | Freq: Four times a day (QID) | ORAL | 1 refills | Status: DC

## 2017-01-31 MED ORDER — SODIUM CHLORIDE 0.9 % IV SOLN: 500.0000 mL | INTRAVENOUS | Status: DC

## 2017-01-31 NOTE — Progress Notes (Signed)
Called to room to assist during endoscopic procedure.  Patient ID and intended procedure confirmed with present staff. Received instructions for my participation in the procedure from the performing physician.  

## 2017-01-31 NOTE — Progress Notes (Signed)
Report given to PACU, vss 

## 2017-01-31 NOTE — Op Note (Signed)
[place] Patient Name: Savannah [name] Procedure Date: 01/31/2017 2:50 PM MRN: [id number] Endoscopist: Remo Lipps P. Armbruster MD, MD Age: 61 Referring MD:  Date of Birth: 1956-11-01 Gender: Female Account #: [id number] Procedure:                Upper GI endoscopy Indications:              Weight loss, reported history of eosinophilic                            esophagitis on protonix, s/p gastric bypass Medicines:                Monitored Anesthesia Care Procedure:                Pre-Anesthesia Assessment:                           - Prior to the procedure, a History and Physical                            was performed, and patient medications and                            allergies were reviewed. The patient's tolerance of                            previous anesthesia was also reviewed. The risks                            and benefits of the procedure and the sedation                            options and risks were discussed with the patient.                            All questions were answered, and informed consent                            was obtained. Prior Anticoagulants: The patient has                            taken no previous anticoagulant or antiplatelet                            agents. ASA Grade Assessment: II - A patient with                            mild systemic disease. After reviewing the risks                            and benefits, the patient was deemed in                            satisfactory condition to undergo the procedure.  After obtaining informed consent, the endoscope was                            passed under direct vision. Throughout the                            procedure, the patient's blood pressure, pulse, and                            oxygen saturations were monitored continuously. The                            Endoscope was introduced through the mouth, and                            advanced to  the second part of duodenum. The upper                            GI endoscopy was accomplished without difficulty.                            The patient tolerated the procedure well. Scope In: Scope Out: Findings:                 Esophagogastric landmarks were identified: the                            Z-line was found at 40 cm, the gastroesophageal                            junction was found at 40 cm and the upper extent of                            the gastric folds was found at 40 cm from the                            incisors.                           The exam of the esophagus was otherwise normal. No                            inflammatory changes or stenosis appreciated.                           Biopsies were taken with a cold forceps in the                            proximal esophagus, in the mid esophagus and in the                            distal esophagus for histology to rule out EoE.  Evidence of a Roux-en-Y gastrojejunostomy was                            found. The gastrojejunal anastomosis was                            characterized by healthy appearing mucosa.                           A few small benign appearing sessile polyps were                            found in the gastric body. These polyps were                            removed with a cold biopsy forceps. Resection and                            retrieval were complete.                           The gastric pouch was larger than expected given                            history of gastric bypass. The exam of the stomach                            was otherwise normal.                           The examined small bowel limb and blind limb was                            normal. Complications:            No immediate complications. Estimated blood loss:                            Minimal. Estimated Blood Loss:     Estimated blood loss was minimal. Impression:               -  Esophagogastric landmarks identified.                           - Normal esophagus, no evidence of EoE - biopsies                            taken to rule out EoE                           - Roux-en-Y gastrojejunostomy with gastrojejunal                            anastomosis characterized by healthy appearing  mucosa.                           - A few benign appearing gastric polyps. Resected                            and retrieved.                           - Larger than expected gastric pouch                           - Normal examined small bowel limb.                           No pathology for symptoms of weight loss. Recommendation:           - Patient has a contact number available for                            emergencies. The signs and symptoms of potential                            delayed complications were discussed with the                            patient. Return to normal activities tomorrow.                            Written discharge instructions were provided to the                            patient.                           - Resume previous diet.                           - Continue present medications.                           - Await pathology results. Remo Lipps P. Armbruster MD, MD 01/31/2017 3:58:28 PM This report has been signed electronically.

## 2017-01-31 NOTE — Progress Notes (Signed)
Patient stating she has lasting effects following endoscopies. Requests a prescription for Carafate post procedure.

## 2017-01-31 NOTE — Patient Instructions (Addendum)
YOU HAD AN ENDOSCOPIC PROCEDURE TODAY AT [facility]:   Refer to the procedure report that was given to you for any specific questions about what was found during the examination.  If the procedure report does not answer your questions, please call your gastroenterologist to clarify.  If you requested that your care partner not be given the details of your procedure findings, then the procedure report has been included in a sealed envelope for you to review at your convenience later.  YOU SHOULD EXPECT: Some feelings of bloating in the abdomen. Passage of more gas than usual.  Walking can help get rid of the air that was put into your GI tract during the procedure and reduce the bloating. If you had a lower endoscopy (such as a colonoscopy or flexible sigmoidoscopy) you may notice spotting of blood in your stool or on the toilet paper. If you underwent a bowel prep for your procedure, you may not have a normal bowel movement for a few days.  Please Note:  You might notice some irritation and congestion in your nose or some drainage.  This is from the oxygen used during your procedure.  There is no need for concern and it should clear up in a day or so.  SYMPTOMS TO REPORT IMMEDIATELY:   Following lower endoscopy (colonoscopy or flexible sigmoidoscopy):  Excessive amounts of blood in the stool  Significant tenderness or worsening of abdominal pains  Swelling of the abdomen that is new, acute  Fever of 100F or higher   Following upper endoscopy (EGD)  Vomiting of blood or coffee ground material  New chest pain or pain under the shoulder blades  Painful or persistently difficult swallowing  New shortness of breath  Fever of 100F or higher  Black, tarry-looking stools  For urgent or emergent issues, a gastroenterologist can be reached at any hour by calling [phone].   DIET:  We do recommend a small meal at first, but then you may proceed to your regular diet.  Drink  plenty of fluids but you should avoid alcoholic beverages for 24 hours.  ACTIVITY:  You should plan to take it easy for the rest of today and you should NOT DRIVE or use heavy machinery until tomorrow (because of the sedation medicines used during the test).    FOLLOW UP: Our staff will call the number listed on your records the next business day following your procedure to check on you and address any questions or concerns that you may have regarding the information given to you following your procedure. If we do not reach you, we will leave a message.  However, if you are feeling well and you are not experiencing any problems, there is no need to return our call.  We will assume that you have returned to your regular daily activities without incident.  If any biopsies were taken you will be contacted by phone or by letter within the next 1-3 weeks.  Please call us at [phone] if you have not heard about the biopsies in 3 weeks.    SIGNATURES/CONFIDENTIALITY: You and/or your care partner have signed paperwork which will be entered into your electronic medical record.  These signatures attest to the fact that that the information above on your After Visit Summary has been reviewed and is understood.  Full responsibility of the confidentiality of this discharge information lies with you and/or your care-partner.  The doctor did biopsy your esophagus to rule eosinophilic esophagitis. We  will write you a letter with the results of all of the biopsies within two .  Your carafate is at your pharmacy as we speak per Dr. Havery Moros.

## 2017-01-31 NOTE — Op Note (Signed)
[facility] Patient Name: Savannah [name] Procedure Date: 01/31/2017 2:50 PM MRN: [id number] Endoscopist: Remo Lipps P. Lan Mcneill MD, MD Age: 61 Referring MD:  Date of Birth: 04-14-1956 Gender: Female Account #: [id number] Procedure:                Colonoscopy Indications:              Hematochezia, Weight loss Medicines:                Monitored Anesthesia Care Procedure:                Pre-Anesthesia Assessment:                           - Prior to the procedure, a History and Physical                            was performed, and patient medications and                            allergies were reviewed. The patient's tolerance of                            previous anesthesia was also reviewed. The risks                            and benefits of the procedure and the sedation                            options and risks were discussed with the patient.                            All questions were answered, and informed consent                            was obtained. Prior Anticoagulants: The patient has                            taken no previous anticoagulant or antiplatelet                            agents. ASA Grade Assessment: II - A patient with                            mild systemic disease. After reviewing the risks                            and benefits, the patient was deemed in                            satisfactory condition to undergo the procedure.                           After obtaining informed consent, the colonoscope  was passed under direct vision. Throughout the                            procedure, the patient's blood pressure, pulse, and                            oxygen saturations were monitored continuously. The                            Colonoscope was introduced through the anus and                            advanced to the the terminal ileum, with                            identification of the appendiceal  orifice and IC                            valve. The colonoscopy was technically difficult                            and complex due to significant looping. The patient                            tolerated the procedure well. The quality of the                            bowel preparation was adequate. The terminal ileum,                            ileocecal valve, appendiceal orifice, and rectum                            were photographed. Scope In: 3:10:55 PM Scope Out: 3:36:40 PM Scope Withdrawal Time: 0 hours 19 minutes 13 seconds  Total Procedure Duration: 0 hours 25 minutes 45 seconds  Findings:                 The perianal and digital rectal examinations were                            normal.                           A 5 mm polyp was found in the sigmoid colon. The                            polyp was sessile. The polyp was removed with a                            cold snare. Resection and retrieval were complete.                           The terminal ileum appeared normal.  Internal hemorrhoids were found during retroflexion.                           The colon revealed excessive looping in the left                            colon.                           The exam was otherwise without abnormality. Complications:            No immediate complications. Estimated blood loss:                            Minimal. Estimated Blood Loss:     Estimated blood loss was minimal. Impression:               - One 5 mm polyp in the sigmoid colon, removed with                            a cold snare. Resected and retrieved.                           - The examined portion of the ileum was normal.                           - Internal hemorrhoids.                           - There was significant looping of the colon.                           - The examination was otherwise normal.                           Suspect hemorrhoids are the cause of the patient's                             hematochezia, no cause for weight loss noted on                            this exam. Recommendation:           - Patient has a contact number available for                            emergencies. The signs and symptoms of potential                            delayed complications were discussed with the                            patient. Return to normal activities tomorrow.                            Written discharge instructions were provided to the  patient.                           - Resume previous diet.                           - Continue present medications.                           - No ibuprofen, naproxen, or other non-steroidal                            anti-inflammatory drugs for 2 weeks after polyp                            removal.                           - Await pathology results.                           - Repeat colonoscopy is recommended for                            surveillance. The colonoscopy date will be                            determined after pathology results from today's                            exam become available for review. Remo Lipps P. Clifford Benninger MD, MD 01/31/2017 3:49:36 PM This report has been signed electronically.

## 2017-02-01 ENCOUNTER — Telehealth: Payer: Self-pay | Admitting: *Deleted

## 2017-02-01 NOTE — Telephone Encounter (Signed)
  Follow up Call-  Call back number 01/31/2017  Post procedure Call Back phone  # [phone]  Permission to leave phone message Yes  Some recent data might be hidden     Patient questions:  Do you have a fever, pain , or abdominal swelling? No. Pain Score  0 *  Have you tolerated food without any problems? Yes.    Have you been able to return to your normal activities? Yes.    Do you have any questions about your discharge instructions: Diet   No. Medications  No. Follow up visit  No.  Do you have questions or concerns about your Care? No.  Actions: * If pain score is 4 or above: No action needed, pain <4.

## 2017-02-06 ENCOUNTER — Encounter: Payer: Self-pay | Admitting: Physician Assistant

## 2017-02-07 ENCOUNTER — Encounter: Payer: Self-pay | Admitting: Gastroenterology

## 2017-02-08 ENCOUNTER — Other Ambulatory Visit: Payer: Self-pay | Admitting: Endocrinology

## 2017-02-09 ENCOUNTER — Other Ambulatory Visit: Payer: Self-pay | Admitting: Endocrinology

## 2017-02-11 DIAGNOSIS — J018 Other acute sinusitis: Secondary | ICD-10-CM | POA: Diagnosis not present

## 2017-02-12 ENCOUNTER — Other Ambulatory Visit: Payer: Self-pay | Admitting: Endocrinology

## 2017-02-12 MED ORDER — METFORMIN HCL ER 500 MG PO TB24: 1000.0000 mg | ORAL_TABLET | Freq: Two times a day (BID) | ORAL | 0 refills | Status: DC

## 2017-02-28 ENCOUNTER — Other Ambulatory Visit: Payer: Self-pay | Admitting: Endocrinology

## 2017-03-08 ENCOUNTER — Other Ambulatory Visit: Payer: Self-pay | Admitting: Endocrinology

## 2017-03-08 DIAGNOSIS — L900 Lichen sclerosus et atrophicus: Secondary | ICD-10-CM | POA: Diagnosis not present

## 2017-04-08 DIAGNOSIS — R0602 Shortness of breath: Secondary | ICD-10-CM | POA: Diagnosis not present

## 2017-04-08 DIAGNOSIS — R938 Abnormal findings on diagnostic imaging of other specified body structures: Secondary | ICD-10-CM | POA: Diagnosis not present

## 2017-04-08 DIAGNOSIS — J22 Unspecified acute lower respiratory infection: Secondary | ICD-10-CM | POA: Diagnosis not present

## 2017-04-09 ENCOUNTER — Telehealth: Payer: Self-pay | Admitting: Endocrinology

## 2017-04-09 DIAGNOSIS — R938 Abnormal findings on diagnostic imaging of other specified body structures: Secondary | ICD-10-CM | POA: Diagnosis not present

## 2017-04-09 NOTE — Telephone Encounter (Signed)
Yes, I see appt is sched for tomorrow.  Please fax [place] the radiol report.

## 2017-04-09 NOTE — Telephone Encounter (Signed)
Patient called to advise that [facility] medical found a mass in her chest that was unresolved. Patient is scheduled to have a scan done today at [facility] and made an appointment to see Loanne Drilling tomorrow morning at 11am. Please call patient today and advise.

## 2017-04-09 NOTE — Telephone Encounter (Signed)
Patient notified of message and she voiced understanding. She will request the records to be transferred.

## 2017-04-10 ENCOUNTER — Ambulatory Visit (INDEPENDENT_AMBULATORY_CARE_PROVIDER_SITE_OTHER): Payer: 59 | Admitting: Endocrinology

## 2017-04-10 ENCOUNTER — Encounter: Payer: Self-pay | Admitting: Endocrinology

## 2017-04-10 VITALS — BP 126/80 | HR 71 | Ht 71.0 in | Wt 167.0 lb

## 2017-04-10 DIAGNOSIS — R9389 Abnormal findings on diagnostic imaging of other specified body structures: Secondary | ICD-10-CM | POA: Insufficient documentation

## 2017-04-10 DIAGNOSIS — R938 Abnormal findings on diagnostic imaging of other specified body structures: Secondary | ICD-10-CM

## 2017-04-10 DIAGNOSIS — E119 Type 2 diabetes mellitus without complications: Secondary | ICD-10-CM | POA: Diagnosis not present

## 2017-04-10 DIAGNOSIS — Z794 Long term (current) use of insulin: Secondary | ICD-10-CM | POA: Diagnosis not present

## 2017-04-10 LAB — POCT GLYCOSYLATED HEMOGLOBIN (HGB A1C): Hemoglobin A1C: 6.6

## 2017-04-10 MED ORDER — FLUCONAZOLE 150 MG PO TABS: 150.0000 mg | ORAL_TABLET | Freq: Every day | ORAL | 1 refills | Status: DC

## 2017-04-10 MED ORDER — FLUTICASONE-SALMETEROL 100-50 MCG/DOSE IN AEPB: 1.0000 | INHALATION_SPRAY | Freq: Two times a day (BID) | RESPIRATORY_TRACT | 2 refills | Status: DC

## 2017-04-10 NOTE — Patient Instructions (Addendum)
Please finish the azithromycin, and:  I have sent 2 prescriptions to your pharmacy, for an inhaler and fluconazole Due to an interaction, skip the simvastatin on days you take the fluconazole.  Please see a lung specialist.  you will receive a phone call, about a day and time for an appointment.  I hope you feel better soon.  If you don't feel better by next week, please call back.  Please call sooner if you get worse. Please continue the same medications for diabetes.

## 2017-04-10 NOTE — Progress Notes (Signed)
Subjective:    Patient ID: Savannah [name], female    DOB: 24-Jun-1956, 61 y.o.   MRN: [id number]  HPI Pt states few weeks of moderate prod-quality cough in the chest, and assoc nasal congestion.  She has been ill 3 times over the past 6 months.  She was seen at [facility] 2 days ago, and was rx'ed zithromax.  Due to abnormal CXR, she had CT.  She says she has had poss PPD since age 36.   Past Medical History:  Diagnosis Date  . ABDOMINAL PAIN, CHRONIC 10/20/2009  . Acute cystitis 08/17/2009  . ALLERGIC RHINITIS 11/21/2007  . Allergy   . ASYMPTOMATIC POSTMENOPAUSAL STATUS 09/29/2008  . Bariatric surgery status 07/07/2010  . Cancer ([place])    uterine  . Cataract   . DEPRESSION 11/21/2007  . DIABETES MELLITUS, TYPE II 07/08/2007  . DYSPNEA 05/20/2009  . Edema 05/20/2009  . Eosinophilic esophagitis 02/14/8184  . FEVER UNSPECIFIED 10/20/2009  . FEVER, HX OF 03/09/2010  . GERD (gastroesophageal reflux disease)   . Headache(784.0) 02/06/2010  . Hepatomegaly 01/06/2008  . HYPERLIPIDEMIA 07/08/2007  . HYPERTENSION 07/08/2007  . LEUKOPENIA, MILD 09/29/2008  . OBESITY 01/06/2008  . OSTEOARTHRITIS 01/06/2008  . Other chronic nonalcoholic liver disease 6/31/4970  . PERIPHERAL NEUROPATHY 11/21/2007  . Peripheral neuropathy   . Postsurgical hypothyroidism 09/19/2010  . SINUSITIS- ACUTE-NOS 11/21/2007  . TB SKIN TEST, POSITIVE 02/06/2010  . THYROID NODULE 03/09/2010    Past Surgical History:  Procedure Laterality Date  . ABDOMINAL HYSTERECTOMY    . COLONOSCOPY    . KNEE ARTHROSCOPY Left   . OOPHORECTOMY    . Right total knee replacement    . ROUX-EN-Y GASTRIC BYPASS  2011  . SKIN TAG REMOVAL    . THYROIDECTOMY    . TONSILLECTOMY AND ADENOIDECTOMY    . UPPER GASTROINTESTINAL ENDOSCOPY      Social History   Social History  . Marital status: Single    Spouse name: N/A  . Number of children: 0  . Years of education: N/A   Occupational History  . consultant    Social History Main Topics  . Smoking status:  Former Smoker    Packs/day: 1.50    Years: 15.00    Types: Cigarettes  . Smokeless tobacco: Never Used  . Alcohol use 0.0 oz/week     Comment: rarely  . Drug use: No  . Sexual activity: Not on file   Other Topics Concern  . Not on file   Social History Narrative  . No narrative on file    Current Outpatient Prescriptions on File Prior to Visit  Medication Sig Dispense Refill  . Cholecalciferol (VITAMIN D3) 400 UNITS tablet Take 400 Units by mouth daily.      [name] docusate sodium (COLACE) 250 MG capsule Take 500 mg by mouth 2 (two) times daily.      . Flaxseed, Linseed, (FLAX SEED OIL PO) Take 1 capsule by mouth daily.      . fluticasone (FLONASE) 50 MCG/ACT nasal spray Place 2 sprays into both nostrils daily.    [name] gabapentin (NEURONTIN) 300 MG capsule TAKE 2 CAPSULES BY MOUTH TWICE A DAY 360 capsule 2  . halobetasol (ULTRAVATE) 0.05 % cream APPLY TO AFFECTED AREA TWICE A DAY AS NEEDED  2  . HYDROcodone-acetaminophen (NORCO/VICODIN) 5-325 MG tablet Take 1 tablet by mouth every 6 (six) hours as needed for moderate pain. 50 tablet 0  . HYDROcodone-homatropine (HYCODAN) 5-1.5 MG/5ML syrup TAKE 1 TO 2 TEASPOONSFUL  BY MOUTH 4 TIMES A DAY AS NEEDED  0  . hydrOXYzine (ATARAX/VISTARIL) 25 MG tablet Take 25 mg by mouth 3 (three) times daily as needed.    . INVOKANA 300 MG TABS tablet TAKE 1 TABLET DAILY BEFORE BREAKFAST 30 tablet 1  . levocetirizine (XYZAL) 5 MG tablet Take 5 mg by mouth every evening.    [name] levothyroxine (SYNTHROID, LEVOTHROID) 125 MCG tablet Take 1 tablet (125 mcg total) by mouth daily before breakfast. 90 tablet 3  . linagliptin (TRADJENTA) 5 MG TABS tablet Take 1 tablet (5 mg total) by mouth daily. 30 tablet 11  . metFORMIN (GLUCOPHAGE-XR) 500 MG 24 hr tablet Take 2 tablets (1,000 mg total) by mouth 2 (two) times daily. 360 tablet 0  . Multiple Vitamin (MULTIVITAMIN) tablet Take 1 tablet by mouth 2 (two) times daily.     . ONE TOUCH ULTRA TEST test strip USE TO CHECK BLOOD  SUGAR 1 TIME PER DAY. 100 each 3  . pantoprazole (PROTONIX) 40 MG tablet TAKE 1 TABLET (40 MG TOTAL) BY MOUTH DAILY. 90 tablet 2  . PENNSAID 2 % SOLN apply two pumps TO affected area(s) three TIMES dailly  2  . repaglinide (PRANDIN) 2 MG tablet TAKE 1 TABLET BY MOUTH 3 TIMES A DAY BEFORE MEALS 180 tablet 2  . simvastatin (ZOCOR) 40 MG tablet TAKE 1 TABLET BY MOUTH EVERY DAY AT BEDTIME 90 tablet 1  . sucralfate (CARAFATE) 1 GM/10ML suspension Take 10 mLs (1 g total) by mouth 4 (four) times daily. 420 mL 1  . topiramate (TOPAMAX) 25 MG tablet Take 2 tablets (50 mg total) by mouth 2 (two) times daily. 360 tablet 2  . traMADol (ULTRAM) 50 MG tablet Take 1 tablet (50 mg total) by mouth every 6 (six) hours as needed. 100 tablet 2   Current Facility-Administered Medications on File Prior to Visit  Medication Dose Route Frequency Provider Last Rate Last Dose  . 0.9 %  sodium chloride infusion  500 mL Intravenous Continuous Armbruster, Renelda Loma, MD        Allergies  Allergen Reactions  . Ace Inhibitors     REACTION: Cough  . Caffeine-Sodium Benzoate     REACTION: PVCs  . Ciprofloxacin Itching  . Nsaids     Gastric Bypass Surgery - unable to take    Family History  Problem Relation Age of Onset  . Diabetes Mother   . Diabetes Father   . Liver cancer Maternal Aunt        2 aunt  . Colon cancer Maternal Uncle     BP 126/80   Pulse 71   Ht 5\' 11"  (1.803 m)   Wt 167 lb (75.8 kg)   SpO2 95%   BMI 23.29 kg/m   Review of Systems Fever and night sweats are improved.     Objective:   Physical Exam VITAL SIGNS:  See vs page GENERAL: no distress LUNGS:  Clear to auscultation, except fpr a few rales at the bases.    A1c=6.5%  outside test results are reviewed: Chest CT: multiple infiltrates    Assessment & Plan:  Pneumonia, new to me.  Abnormal chest CT, which has findings not fully explained by pneumonia.  Vaginitis, recurrent. Dyslipidemia: there is an interaction between  zocor and diflucan.   Patient Instructions  Please finish the azithromycin, and:  I have sent 2 prescriptions to your pharmacy, for an inhaler and fluconazole Due to an interaction, skip the simvastatin on days you take the  fluconazole.  Please see a lung specialist.  you will receive a phone call, about a day and time for an appointment.  I hope you feel better soon.  If you don't feel better by next week, please call back.  Please call sooner if you get worse. Please continue the same medications for diabetes.

## 2017-04-11 DIAGNOSIS — S92504A Nondisplaced unspecified fracture of right lesser toe(s), initial encounter for closed fracture: Secondary | ICD-10-CM | POA: Diagnosis not present

## 2017-04-19 ENCOUNTER — Ambulatory Visit (INDEPENDENT_AMBULATORY_CARE_PROVIDER_SITE_OTHER)
Admission: RE | Admit: 2017-04-19 | Discharge: 2017-04-19 | Disposition: A | Discharge status: Home / Self Care | Payer: 59 | Source: Ambulatory Visit | Attending: Pulmonary Disease | Admitting: Pulmonary Disease

## 2017-04-19 ENCOUNTER — Ambulatory Visit (INDEPENDENT_AMBULATORY_CARE_PROVIDER_SITE_OTHER): Payer: 59 | Admitting: Pulmonary Disease

## 2017-04-19 ENCOUNTER — Encounter: Payer: Self-pay | Admitting: Pulmonary Disease

## 2017-04-19 DIAGNOSIS — J189 Pneumonia, unspecified organism: Secondary | ICD-10-CM | POA: Diagnosis not present

## 2017-04-19 DIAGNOSIS — R0689 Other abnormalities of breathing: Secondary | ICD-10-CM

## 2017-04-19 DIAGNOSIS — R0600 Dyspnea, unspecified: Secondary | ICD-10-CM | POA: Diagnosis not present

## 2017-04-19 DIAGNOSIS — R918 Other nonspecific abnormal finding of lung field: Secondary | ICD-10-CM

## 2017-04-19 NOTE — Patient Instructions (Addendum)
We'll get a chest x-ray for evaluation of your lung infiltrates If still persistent we may need to consider a bronchoscope Please try to get the images of your CT done at [facility] for our review  Return to clinic in 1-2 months

## 2017-04-19 NOTE — Progress Notes (Signed)
Savannah [name]    [id number]    1955-12-20  Primary Care Physician:Ellison, Savannah Clark, MD  Referring Physician: Renato Shin, MD [address] [place], [place] [address]  Chief complaint:  Consult for evaluation of abnormal CT scan  HPI: Mrs. Savannah [name] is a 61 year old with past history history of hyperlipidemia, hypertension, positive PPD. She 3 episodes of bronchitis over the past 6 months. She was last seen at [facility] and treated with Z-Pak. She had a chest x-ray and CT scan which showed infiltrates. She has been referred to pulmonary clinic for further evaluation. I do not have the images of these studies to review.  She still has productive cough, denies any fevers, chills. She is weak substernal chest pain, irregular heartbeat. She had old CT scans that she is currently metastatic disease and mediastinal calcifications. There was a CT of the abdomen done at [facility] in February that showed a new 0.8 cm groundglass opacity in the right lower lobe. She has a history of positive PPD from the age of 36 with no known exposures to TB.   Pets: Cat Occupation: Optometrist for healthcare IT Exposures: No known exposures. Lived in the [place], [place], [place], [place]. Smoking history: 17-pack-year smoking history. Quit in 1998  Outpatient Encounter Prescriptions as of 04/19/2017  Medication Sig  . Cholecalciferol (VITAMIN D3) 400 UNITS tablet Take 400 Units by mouth daily.    [name] docusate sodium (COLACE) 250 MG capsule Take 500 mg by mouth 2 (two) times daily.    . Flaxseed, Linseed, (FLAX SEED OIL PO) Take 1 capsule by mouth daily.    . fluconazole (DIFLUCAN) 150 MG tablet Take 1 tablet (150 mg total) by mouth daily.  . fluticasone (FLONASE) 50 MCG/ACT nasal spray Place 2 sprays into both nostrils daily.  [name] gabapentin (NEURONTIN) 300 MG capsule TAKE 2 CAPSULES BY MOUTH TWICE A DAY  . halobetasol (ULTRAVATE) 0.05 % cream APPLY TO AFFECTED AREA TWICE A DAY AS NEEDED  .  HYDROcodone-acetaminophen (NORCO/VICODIN) 5-325 MG tablet Take 1 tablet by mouth every 6 (six) hours as needed for moderate pain.  [name] HYDROcodone-homatropine (HYCODAN) 5-1.5 MG/5ML syrup TAKE 1 TO 2 TEASPOONSFUL BY MOUTH 4 TIMES A DAY AS NEEDED  . hydrOXYzine (ATARAX/VISTARIL) 25 MG tablet Take 25 mg by mouth 3 (three) times daily as needed.  . INVOKANA 300 MG TABS tablet TAKE 1 TABLET DAILY BEFORE BREAKFAST  . levocetirizine (XYZAL) 5 MG tablet Take 5 mg by mouth every evening.  [name] levothyroxine (SYNTHROID, LEVOTHROID) 125 MCG tablet Take 1 tablet (125 mcg total) by mouth daily before breakfast.  . linagliptin (TRADJENTA) 5 MG TABS tablet Take 1 tablet (5 mg total) by mouth daily.  . metFORMIN (GLUCOPHAGE-XR) 500 MG 24 hr tablet Take 2 tablets (1,000 mg total) by mouth 2 (two) times daily.  . Multiple Vitamin (MULTIVITAMIN) tablet Take 1 tablet by mouth 2 (two) times daily.   . ONE TOUCH ULTRA TEST test strip USE TO CHECK BLOOD SUGAR 1 TIME PER DAY.  . pantoprazole (PROTONIX) 40 MG tablet TAKE 1 TABLET (40 MG TOTAL) BY MOUTH DAILY.  [name] PENNSAID 2 % SOLN apply two pumps TO affected area(s) three TIMES dailly  . repaglinide (PRANDIN) 2 MG tablet TAKE 1 TABLET BY MOUTH 3 TIMES A DAY BEFORE MEALS  . simvastatin (ZOCOR) 40 MG tablet TAKE 1 TABLET BY MOUTH EVERY DAY AT BEDTIME  . sucralfate (CARAFATE) 1 GM/10ML suspension Take 10 mLs (1 g total)  by mouth 4 (four) times daily.  [name] topiramate (TOPAMAX) 25 MG tablet Take 2 tablets (50 mg total) by mouth 2 (two) times daily.  . traMADol (ULTRAM) 50 MG tablet Take 1 tablet (50 mg total) by mouth every 6 (six) hours as needed.  . Fluticasone-Salmeterol (ADVAIR DISKUS) 100-50 MCG/DOSE AEPB Inhale 1 puff into the lungs 2 (two) times daily. (Patient not taking: Reported on 04/19/2017)   Facility-Administered Encounter Medications as of 04/19/2017  Medication  . 0.9 %  sodium chloride infusion    Allergies as of 04/19/2017 - Review Complete 04/19/2017  Allergen  Reaction Noted  . Ace inhibitors    . Caffeine-sodium benzoate  02/06/2010  . Ciprofloxacin Itching 11/16/2016  . Nsaids  02/03/2016    Past Medical History:  Diagnosis Date  . ABDOMINAL PAIN, CHRONIC 10/20/2009  . Acute cystitis 08/17/2009  . ALLERGIC RHINITIS 11/21/2007  . Allergy   . ASYMPTOMATIC POSTMENOPAUSAL STATUS 09/29/2008  . Bariatric surgery status 07/07/2010  . Cancer ([place])    uterine  . Cataract   . DEPRESSION 11/21/2007  . DIABETES MELLITUS, TYPE II 07/08/2007  . DYSPNEA 05/20/2009  . Edema 05/20/2009  . Eosinophilic esophagitis 04/14/1496  . FEVER UNSPECIFIED 10/20/2009  . FEVER, HX OF 03/09/2010  . GERD (gastroesophageal reflux disease)   . Headache(784.0) 02/06/2010  . Hepatomegaly 01/06/2008  . HYPERLIPIDEMIA 07/08/2007  . HYPERTENSION 07/08/2007  . LEUKOPENIA, MILD 09/29/2008  . OBESITY 01/06/2008  . OSTEOARTHRITIS 01/06/2008  . Other chronic nonalcoholic liver disease 0/26/3785  . PERIPHERAL NEUROPATHY 11/21/2007  . Peripheral neuropathy   . Postsurgical hypothyroidism 09/19/2010  . SINUSITIS- ACUTE-NOS 11/21/2007  . TB SKIN TEST, POSITIVE 02/06/2010  . THYROID NODULE 03/09/2010    Past Surgical History:  Procedure Laterality Date  . ABDOMINAL HYSTERECTOMY    . COLONOSCOPY    . KNEE ARTHROSCOPY Left   . OOPHORECTOMY    . Right total knee replacement    . ROUX-EN-Y GASTRIC BYPASS  2011  . SKIN TAG REMOVAL    . THYROIDECTOMY    . TONSILLECTOMY AND ADENOIDECTOMY    . UPPER GASTROINTESTINAL ENDOSCOPY      Family History  Problem Relation Age of Onset  . Diabetes Mother   . Diabetes Father   . Liver cancer Maternal Aunt        2 aunt  . Colon cancer Maternal Uncle     Social History   Social History  . Marital status: Single    Spouse name: N/A  . Number of children: 0  . Years of education: N/A   Occupational History  . consultant    Social History Main Topics  . Smoking status: Former Smoker    Packs/day: 1.50    Years: 15.00    Types: Cigarettes    . Smokeless tobacco: Never Used  . Alcohol use 0.0 oz/week     Comment: rarely  . Drug use: No  . Sexual activity: Not on file   Other Topics Concern  . Not on file   Social History Narrative  . No narrative on file    Review of systems: Review of Systems  Constitutional: Negative for fever and chills.  HENT: Negative.   Eyes: Negative for blurred vision.  Respiratory: as per HPI  Cardiovascular: Negative for chest pain and palpitations.  Gastrointestinal: Negative for vomiting, diarrhea, blood per rectum. Genitourinary: Negative for dysuria, urgency, frequency and hematuria.  Musculoskeletal: Negative for myalgias, back pain and joint pain.  Skin: Negative for itching and rash.  Neurological: Negative for dizziness, tremors, focal weakness, seizures and loss of consciousness.  Endo/Heme/Allergies: Negative for environmental allergies.  Psychiatric/Behavioral: Negative for depression, suicidal ideas and hallucinations.  All other systems reviewed and are negative.  Physical Exam: There were no vitals taken for this visit. Gen:      No acute distress HEENT:  EOMI, sclera anicteric Neck:     No masses; no thyromegaly Lungs:    Clear to auscultation bilaterally; normal respiratory effort CV:         Regular rate and rhythm; no murmurs Abd:      + bowel sounds; soft, non-tender; no palpable masses, no distension Ext:    No edema; adequate peripheral perfusion Skin:      Warm and dry; no rash Neuro: alert and oriented x 3 Psych: normal mood and affect  Data Reviewed: CT abdomen pelvis 12/21/16- 0.6 mm groundglass nodule in the right lower lobe. Evidence of prior granulomatous disease CT chest 02/17/10-bilateral calcified granulomas, calcified hilar lymph nodes CT scan 05/21/09-upper lobe groundglass opacities, bilateral calcified granuloma, calcified hilar lymph nodes. I have reviewed the images personally.  Report [facility] 04/09/17 No axillary,  mediastinal or hilar mass or adenopathy. no evidence of thoracic aortic aneurysm or dissection No filling defects in pulmonary arteries Extensive bilateral alveolar infiltrates. No mass, consolidation, effusion Bilateral granulomas, calcified hilar and mediastinal nodes Report scanned into the computer.  Assessment:  Evaluation for abnormal CT scan She has imaging dating back to 2010 with calcified granulomas, calcified hilar lymph nodes indicative of old infection Her recent is read as extensive bilateral alveolar infiltrates. I'll try to get the images of actual CT to review We will get a chest x-ray today for further evaluation. If there is persistence of infiltrates then she may need a bronchoscope for further evaluation  Plan/Recommendations: - Chest x-ray - Obtain images of recent CT scan chest  Marshell Garfinkel MD [facility] Pager [phone] 04/19/2017, 4:17 PM  CC: Savannah Shin, MD

## 2017-04-29 ENCOUNTER — Other Ambulatory Visit: Payer: Self-pay

## 2017-04-29 MED ORDER — CANAGLIFLOZIN 300 MG PO TABS: 300.0000 mg | ORAL_TABLET | Freq: Every day | ORAL | 1 refills | Status: DC

## 2017-05-24 ENCOUNTER — Institutional Professional Consult (permissible substitution): Payer: 59 | Admitting: Internal Medicine

## 2017-05-24 DIAGNOSIS — Z01419 Encounter for gynecological examination (general) (routine) without abnormal findings: Secondary | ICD-10-CM | POA: Diagnosis not present

## 2017-06-07 ENCOUNTER — Ambulatory Visit (INDEPENDENT_AMBULATORY_CARE_PROVIDER_SITE_OTHER): Payer: 59 | Admitting: Endocrinology

## 2017-06-07 ENCOUNTER — Encounter: Payer: Self-pay | Admitting: Endocrinology

## 2017-06-07 VITALS — BP 118/70 | HR 79 | Ht 71.0 in | Wt 185.0 lb

## 2017-06-07 DIAGNOSIS — R600 Localized edema: Secondary | ICD-10-CM | POA: Diagnosis not present

## 2017-06-07 DIAGNOSIS — Z9884 Bariatric surgery status: Secondary | ICD-10-CM | POA: Diagnosis not present

## 2017-06-07 DIAGNOSIS — R413 Other amnesia: Secondary | ICD-10-CM | POA: Diagnosis not present

## 2017-06-07 DIAGNOSIS — Z794 Long term (current) use of insulin: Secondary | ICD-10-CM

## 2017-06-07 DIAGNOSIS — E119 Type 2 diabetes mellitus without complications: Secondary | ICD-10-CM | POA: Diagnosis not present

## 2017-06-07 LAB — VITAMIN D 25 HYDROXY (VIT D DEFICIENCY, FRACTURES): VITD: 44 ng/mL (ref 30.00–100.00)

## 2017-06-07 LAB — CBC WITH DIFFERENTIAL/PLATELET
Basophils Absolute: 0 10*3/uL (ref 0.0–0.1)
Basophils Relative: 0.7 % (ref 0.0–3.0)
Eosinophils Absolute: 0.1 10*3/uL (ref 0.0–0.7)
Eosinophils Relative: 4.3 % (ref 0.0–5.0)
HCT: 39.1 % (ref 36.0–46.0)
Hemoglobin: 12.6 g/dL (ref 12.0–15.0)
Lymphocytes Relative: 30 % (ref 12.0–46.0)
Lymphs Abs: 1 10*3/uL (ref 0.7–4.0)
MCHC: 32.2 g/dL (ref 30.0–36.0)
MCV: 96.8 fl (ref 78.0–100.0)
Monocytes Absolute: 0.3 10*3/uL (ref 0.1–1.0)
Monocytes Relative: 9.8 % (ref 3.0–12.0)
Neutro Abs: 1.9 10*3/uL (ref 1.4–7.7)
Neutrophils Relative %: 55.2 % (ref 43.0–77.0)
Platelets: 144 10*3/uL — ABNORMAL LOW (ref 150.0–400.0)
RBC: 4.04 Mil/uL (ref 3.87–5.11)
RDW: 14.6 % (ref 11.5–15.5)
WBC: 3.5 10*3/uL — ABNORMAL LOW (ref 4.0–10.5)

## 2017-06-07 LAB — BASIC METABOLIC PANEL
BUN: 18 mg/dL (ref 6–23)
CO2: 26 mEq/L (ref 19–32)
Calcium: 8.8 mg/dL (ref 8.4–10.5)
Chloride: 109 mEq/L (ref 96–112)
Creatinine, Ser: 0.89 mg/dL (ref 0.40–1.20)
GFR: 68.07 mL/min (ref >60.00)
Glucose, Bld: 165 mg/dL — ABNORMAL HIGH (ref 70–99)
Potassium: 3.8 mEq/L (ref 3.5–5.1)
Sodium: 141 mEq/L (ref 135–145)

## 2017-06-07 LAB — IBC PANEL
Iron: 75 ug/dL (ref 42–145)
Saturation Ratios: 20.9 % (ref 20.0–50.0)
Transferrin: 256 mg/dL (ref 212.0–360.0)

## 2017-06-07 LAB — VITAMIN B12: Vitamin B-12: 487 pg/mL (ref 211–911)

## 2017-06-07 LAB — BRAIN NATRIURETIC PEPTIDE: Pro B Natriuretic peptide (BNP): 86 pg/mL (ref 0.0–100.0)

## 2017-06-07 LAB — MAGNESIUM: Magnesium: 2.1 mg/dL (ref 1.5–2.5)

## 2017-06-07 LAB — POCT GLYCOSYLATED HEMOGLOBIN (HGB A1C): Hemoglobin A1C: 6.3

## 2017-06-07 MED ORDER — TRAMADOL HCL 50 MG PO TABS: 50.0000 mg | ORAL_TABLET | Freq: Four times a day (QID) | ORAL | 2 refills | Status: DC | PRN

## 2017-06-07 MED ORDER — HYDROCODONE-ACETAMINOPHEN 5-325 MG PO TABS: 1.0000 | ORAL_TABLET | Freq: Four times a day (QID) | ORAL | 0 refills | Status: DC | PRN

## 2017-06-07 NOTE — Progress Notes (Signed)
Subjective:    Patient ID: Savannah [name], female    DOB: 1956/03/22, 61 y.o.   MRN: [id number]  HPI Pt returns for f/u of diabetes mellitus:  DM type: 2 Dx'ed: 8469 Complications: painful polyneuropathy Therapy: 4 oral meds GDM: never DKA: never Severe hypoglycemia: never.   Pancreatitis: never.  Other: she took insulin from 2004-2011, when she had gastric bypass surgery.   Interval history: she takes meds as rx'ed.  Edema is worse Chronic low-bck pain persists.  She seldom needs to take vicodin Anxiety: pt says this is well-controlled on xanax.  Past Medical History:  Diagnosis Date  . ABDOMINAL PAIN, CHRONIC 10/20/2009  . Acute cystitis 08/17/2009  . ALLERGIC RHINITIS 11/21/2007  . Allergy   . ASYMPTOMATIC POSTMENOPAUSAL STATUS 09/29/2008  . Bariatric surgery status 07/07/2010  . Cancer ([place])    uterine  . Cataract   . DEPRESSION 11/21/2007  . DIABETES MELLITUS, TYPE II 07/08/2007  . DYSPNEA 05/20/2009  . Edema 05/20/2009  . Eosinophilic esophagitis 04/14/9527  . FEVER UNSPECIFIED 10/20/2009  . FEVER, HX OF 03/09/2010  . GERD (gastroesophageal reflux disease)   . Headache(784.0) 02/06/2010  . Hepatomegaly 01/06/2008  . HYPERLIPIDEMIA 07/08/2007  . HYPERTENSION 07/08/2007  . LEUKOPENIA, MILD 09/29/2008  . OBESITY 01/06/2008  . OSTEOARTHRITIS 01/06/2008  . Other chronic nonalcoholic liver disease 02/23/2439  . PERIPHERAL NEUROPATHY 11/21/2007  . Peripheral neuropathy   . Postsurgical hypothyroidism 09/19/2010  . SINUSITIS- ACUTE-NOS 11/21/2007  . TB SKIN TEST, POSITIVE 02/06/2010  . THYROID NODULE 03/09/2010    Past Surgical History:  Procedure Laterality Date  . ABDOMINAL HYSTERECTOMY    . COLONOSCOPY    . KNEE ARTHROSCOPY Left   . OOPHORECTOMY    . Right total knee replacement    . ROUX-EN-Y GASTRIC BYPASS  2011  . SKIN TAG REMOVAL    . THYROIDECTOMY    . TONSILLECTOMY AND ADENOIDECTOMY    . UPPER GASTROINTESTINAL ENDOSCOPY      Social History   Social History  . Marital  status: Single    Spouse name: N/A  . Number of children: 0  . Years of education: N/A   Occupational History  . consultant    Social History Main Topics  . Smoking status: Former Smoker    Packs/day: 1.50    Years: 15.00    Types: Cigarettes  . Smokeless tobacco: Never Used  . Alcohol use 0.0 oz/week     Comment: rarely  . Drug use: No  . Sexual activity: Not on file   Other Topics Concern  . Not on file   Social History Narrative  . No narrative on file    Current Outpatient Prescriptions on File Prior to Visit  Medication Sig Dispense Refill  . canagliflozin (INVOKANA) 300 MG TABS tablet Take 1 tablet (300 mg total) by mouth daily before breakfast. 30 tablet 1  . Cholecalciferol (VITAMIN D3) 400 UNITS tablet Take 400 Units by mouth daily.      [name] docusate sodium (COLACE) 250 MG capsule Take 500 mg by mouth 2 (two) times daily.      . Flaxseed, Linseed, (FLAX SEED OIL PO) Take 1 capsule by mouth daily.      . fluticasone (FLONASE) 50 MCG/ACT nasal spray Place 2 sprays into both nostrils daily.    . Fluticasone-Salmeterol (ADVAIR DISKUS) 100-50 MCG/DOSE AEPB Inhale 1 puff into the lungs 2 (two) times daily. 1 each 2  . gabapentin (NEURONTIN) 300 MG capsule TAKE 2 CAPSULES BY MOUTH TWICE  A DAY 360 capsule 2  . halobetasol (ULTRAVATE) 0.05 % cream APPLY TO AFFECTED AREA TWICE A DAY AS NEEDED  2  . hydrOXYzine (ATARAX/VISTARIL) 25 MG tablet Take 25 mg by mouth 3 (three) times daily as needed.    [name] levocetirizine (XYZAL) 5 MG tablet Take 5 mg by mouth every evening.    [name] levothyroxine (SYNTHROID, LEVOTHROID) 125 MCG tablet Take 1 tablet (125 mcg total) by mouth daily before breakfast. 90 tablet 3  . linagliptin (TRADJENTA) 5 MG TABS tablet Take 1 tablet (5 mg total) by mouth daily. 30 tablet 11  . metFORMIN (GLUCOPHAGE-XR) 500 MG 24 hr tablet Take 2 tablets (1,000 mg total) by mouth 2 (two) times daily. 360 tablet 0  . Multiple Vitamin (MULTIVITAMIN) tablet Take 1 tablet by mouth  2 (two) times daily.     . ONE TOUCH ULTRA TEST test strip USE TO CHECK BLOOD SUGAR 1 TIME PER DAY. 100 each 3  . pantoprazole (PROTONIX) 40 MG tablet TAKE 1 TABLET (40 MG TOTAL) BY MOUTH DAILY. 90 tablet 2  . PENNSAID 2 % SOLN apply two pumps TO affected area(s) three TIMES dailly  2  . repaglinide (PRANDIN) 2 MG tablet TAKE 1 TABLET BY MOUTH 3 TIMES A DAY BEFORE MEALS 180 tablet 2  . simvastatin (ZOCOR) 40 MG tablet TAKE 1 TABLET BY MOUTH EVERY DAY AT BEDTIME 90 tablet 1  . sucralfate (CARAFATE) 1 GM/10ML suspension Take 10 mLs (1 g total) by mouth 4 (four) times daily. 420 mL 1  . topiramate (TOPAMAX) 25 MG tablet Take 2 tablets (50 mg total) by mouth 2 (two) times daily. 360 tablet 2   Current Facility-Administered Medications on File Prior to Visit  Medication Dose Route Frequency Provider Last Rate Last Dose  . 0.9 %  sodium chloride infusion  500 mL Intravenous Continuous Armbruster, Renelda Loma, MD        Allergies  Allergen Reactions  . Ace Inhibitors     REACTION: Cough  . Caffeine-Sodium Benzoate     REACTION: PVCs  . Ciprofloxacin Itching  . Nsaids     Gastric Bypass Surgery - unable to take    Family History  Problem Relation Age of Onset  . Diabetes Mother   . Diabetes Father   . Liver cancer Maternal Aunt        2 aunt  . Colon cancer Maternal Uncle     BP 118/70   Pulse 79   Ht 5\' 11"  (1.803 m)   Wt 185 lb (83.9 kg)   SpO2 98%   BMI 25.80 kg/m    Review of Systems Denies sob.  She has cramps of the hands.  Takes D-3, 5000 units/day.  She reports mild memory loss.  She does not mix tramadol and vicodin    Objective:   Physical Exam  VITAL SIGNS:  See vs page GENERAL: no distress Pulses: foot pulses are intact bilaterally.   MSK: no deformity of the feet or ankles.  CV: 1+ bilat edema of the legs Skin:  no ulcer on the feet or ankles.  normal color and temp on the feet and ankles Neuro: sensation is intact to touch on the feet and ankles.     A1c=6.3%  Lab Results  Component Value Date   CREATININE 0.97 04/13/2016   BUN 19 04/13/2016   NA 142 04/13/2016   K 4.0 04/13/2016   CL 109 04/13/2016   CO2 25 04/13/2016       Assessment &  Plan:  Type 2 DM: well-controlled Edema: BP is too low for lasix. Chronic low-back pain: well-controlled on 2 alternating meds.  Alternating meds are to minimize addiction risk. Please continue the same medication Memory loss, prob due to depression and meds  Patient Instructions  Please continue the same medications for diabetes.   blood tests are requested for you today.  We'll let you know about the results. Please see a psychologist for the memory. you will receive a phone call, about a day and time for an appointment Please come back for a follow-up appointment in 4 months.

## 2017-06-07 NOTE — Patient Instructions (Addendum)
Please continue the same medications for diabetes.   blood tests are requested for you today.  We'll let you know about the results. Please see a psychologist for the memory. you will receive a phone call, about a day and time for an appointment Please come back for a follow-up appointment in 4 months.

## 2017-06-14 DIAGNOSIS — H04123 Dry eye syndrome of bilateral lacrimal glands: Secondary | ICD-10-CM | POA: Diagnosis not present

## 2017-06-14 DIAGNOSIS — H25813 Combined forms of age-related cataract, bilateral: Secondary | ICD-10-CM | POA: Diagnosis not present

## 2017-06-14 DIAGNOSIS — H353131 Nonexudative age-related macular degeneration, bilateral, early dry stage: Secondary | ICD-10-CM | POA: Diagnosis not present

## 2017-06-28 ENCOUNTER — Other Ambulatory Visit: Payer: Self-pay | Admitting: Endocrinology

## 2017-06-29 ENCOUNTER — Other Ambulatory Visit: Payer: Self-pay | Admitting: Endocrinology

## 2017-07-05 DIAGNOSIS — Z1231 Encounter for screening mammogram for malignant neoplasm of breast: Secondary | ICD-10-CM | POA: Diagnosis not present

## 2017-07-12 ENCOUNTER — Ambulatory Visit (INDEPENDENT_AMBULATORY_CARE_PROVIDER_SITE_OTHER): Payer: 59 | Admitting: Pulmonary Disease

## 2017-07-12 ENCOUNTER — Encounter: Payer: Self-pay | Admitting: Pulmonary Disease

## 2017-07-12 VITALS — BP 130/80 | HR 84 | Ht 71.0 in | Wt 187.2 lb

## 2017-07-12 DIAGNOSIS — R918 Other nonspecific abnormal finding of lung field: Secondary | ICD-10-CM | POA: Diagnosis not present

## 2017-07-12 DIAGNOSIS — J849 Interstitial pulmonary disease, unspecified: Secondary | ICD-10-CM | POA: Diagnosis not present

## 2017-07-12 NOTE — Progress Notes (Addendum)
Savannah [name]    [id number]    [birth year]  Primary Care Physician:Ellison, Hilliard Clark, MD  Referring Physician: Renato Shin, MD [address] [place], [place] [address]  Chief complaint:  Follow up for abnormal CT scan  HPI: Savannah [name] is a 61 year old with past history history of hyperlipidemia, hypertension, positive PPD. She 3 episodes of bronchitis over the past 6 months. She was last seen at [facility] and treated with Z-Pak. She had a chest x-ray and CT scan which showed infiltrates. She has been referred to pulmonary clinic for further evaluation. I do not have the images of these studies to review.  She still has productive cough, denies any fevers, chills. She is weak substernal chest pain, irregular heartbeat. She had old CT scans that she is currently metastatic disease and mediastinal calcifications. There was a CT of the abdomen done at [facility] in February that showed a new 0.8 cm groundglass opacity in the right lower lobe. She has a history of positive PPD from the age of 3 with no known exposures to TB.   Pets: Cat Occupation: Optometrist for healthcare IT Exposures: No known exposures. Lived in the [place], [place], [place], [place]. Smoking history: 17-pack-year smoking history. Quit in 1998  Interim History: She has a chest x-ray at last visit which shows clearance of opacities Feels well with no new resp concerns today. No cough, sputum production, fevers, chills. She has been prescribed Advair at last visit but is not using this often. She is dealing with symptoms of his eosinophilic esophagitis with strictures with complains of intermittent spasmodic chest pain.   Outpatient Encounter Prescriptions as of 07/12/2017  Medication Sig  . Cholecalciferol (VITAMIN D3) 400 UNITS tablet Take 400 Units by mouth daily.    [name] docusate sodium (COLACE) 250 MG capsule Take 500 mg by mouth 2 (two) times daily.    . Flaxseed, Linseed, (FLAX SEED OIL PO) Take 1  capsule by mouth daily.    . fluticasone (FLONASE) 50 MCG/ACT nasal spray Place 2 sprays into both nostrils daily.  [name] gabapentin (NEURONTIN) 300 MG capsule TAKE 2 CAPSULES BY MOUTH TWICE A DAY  . halobetasol (ULTRAVATE) 0.05 % cream APPLY TO AFFECTED AREA TWICE A DAY AS NEEDED  . HYDROcodone-acetaminophen (NORCO/VICODIN) 5-325 MG tablet Take 1 tablet by mouth every 6 (six) hours as needed for moderate pain.  . hydrOXYzine (ATARAX/VISTARIL) 25 MG tablet Take 25 mg by mouth 3 (three) times daily as needed.  . INVOKANA 300 MG TABS tablet TAKE 1 TABLET (300 MG TOTAL) BY MOUTH DAILY BEFORE BREAKFAST.  [name] levocetirizine (XYZAL) 5 MG tablet Take 5 mg by mouth every evening.  [name] levothyroxine (SYNTHROID, LEVOTHROID) 125 MCG tablet Take 1 tablet (125 mcg total) by mouth daily before breakfast.  . linagliptin (TRADJENTA) 5 MG TABS tablet Take 1 tablet (5 mg total) by mouth daily.  . metFORMIN (GLUCOPHAGE-XR) 500 MG 24 hr tablet TAKE 2 TABLETS TWICE A DAY  . Multiple Vitamin (MULTIVITAMIN) tablet Take 1 tablet by mouth 2 (two) times daily.   . ONE TOUCH ULTRA TEST test strip USE TO CHECK BLOOD SUGAR 1 TIME PER DAY.  . pantoprazole (PROTONIX) 40 MG tablet TAKE 1 TABLET (40 MG TOTAL) BY MOUTH DAILY.  [name] PENNSAID 2 % SOLN apply two pumps TO affected area(s) three TIMES dailly  . repaglinide (PRANDIN) 2 MG tablet TAKE 1 TABLET BY MOUTH 3 TIMES A DAY BEFORE MEALS  . simvastatin (ZOCOR)  40 MG tablet TAKE 1 TABLET BY MOUTH EVERY DAY AT BEDTIME  . sucralfate (CARAFATE) 1 GM/10ML suspension Take 10 mLs (1 g total) by mouth 4 (four) times daily.  [name] topiramate (TOPAMAX) 50 MG tablet TAKE 1 TABLET BY MOUTH TWICE A DAY  . traMADol (ULTRAM) 50 MG tablet Take 1 tablet (50 mg total) by mouth every 6 (six) hours as needed.  . [DISCONTINUED] Fluticasone-Salmeterol (ADVAIR DISKUS) 100-50 MCG/DOSE AEPB Inhale 1 puff into the lungs 2 (two) times daily.  . [DISCONTINUED] topiramate (TOPAMAX) 25 MG tablet Take 2 tablets (50 mg  total) by mouth 2 (two) times daily.  [name] amoxicillin (AMOXIL) 500 MG capsule TAKE 4 CAPSULES 1 HOUR BEFORE DEWNTAL APPOINTMENT  . traMADol (ULTRAM) 50 MG tablet tramadol 50 mg tablet  TAKE 1 TABLET BY MOUTH EVERY 6 HOURS AS NEEDED   Facility-Administered Encounter Medications as of 07/12/2017  Medication  . 0.9 %  sodium chloride infusion    Allergies as of 07/12/2017 - Review Complete 07/12/2017  Allergen Reaction Noted  . Ace inhibitors    . Caffeine-sodium benzoate  02/06/2010  . Ciprofloxacin Itching 11/16/2016  . Nsaids  02/03/2016    Past Medical History:  Diagnosis Date  . ABDOMINAL PAIN, CHRONIC 10/20/2009  . Acute cystitis 08/17/2009  . ALLERGIC RHINITIS 11/21/2007  . Allergy   . ASYMPTOMATIC POSTMENOPAUSAL STATUS 09/29/2008  . Bariatric surgery status 07/07/2010  . Cancer ([place])    uterine  . Cataract   . DEPRESSION 11/21/2007  . DIABETES MELLITUS, TYPE II 07/08/2007  . DYSPNEA 05/20/2009  . Edema 05/20/2009  . Eosinophilic esophagitis 06/16/2777  . FEVER UNSPECIFIED 10/20/2009  . FEVER, HX OF 03/09/2010  . GERD (gastroesophageal reflux disease)   . Headache(784.0) 02/06/2010  . Hepatomegaly 01/06/2008  . HYPERLIPIDEMIA 07/08/2007  . HYPERTENSION 07/08/2007  . LEUKOPENIA, MILD 09/29/2008  . OBESITY 01/06/2008  . OSTEOARTHRITIS 01/06/2008  . Other chronic nonalcoholic liver disease 2/42/3536  . PERIPHERAL NEUROPATHY 11/21/2007  . Peripheral neuropathy   . Postsurgical hypothyroidism 09/19/2010  . SINUSITIS- ACUTE-NOS 11/21/2007  . TB SKIN TEST, POSITIVE 02/06/2010  . THYROID NODULE 03/09/2010    Past Surgical History:  Procedure Laterality Date  . ABDOMINAL HYSTERECTOMY    . COLONOSCOPY    . KNEE ARTHROSCOPY Left   . OOPHORECTOMY    . Right total knee replacement    . ROUX-EN-Y GASTRIC BYPASS  2011  . SKIN TAG REMOVAL    . THYROIDECTOMY    . TONSILLECTOMY AND ADENOIDECTOMY    . UPPER GASTROINTESTINAL ENDOSCOPY      Family History  Problem Relation Age of Onset  .  Diabetes Mother   . Diabetes Father   . Liver cancer Maternal Aunt        2 aunt  . Colon cancer Maternal Uncle     Social History   Social History  . Marital status: Single    Spouse name: N/A  . Number of children: 0  . Years of education: N/A   Occupational History  . consultant    Social History Main Topics  . Smoking status: Former Smoker    Packs/day: 1.50    Years: 15.00    Types: Cigarettes  . Smokeless tobacco: Never Used  . Alcohol use 0.0 oz/week     Comment: rarely  . Drug use: No  . Sexual activity: Not on file   Other Topics Concern  . Not on file   Social History Narrative  . No narrative on file  Review of systems: Review of Systems  Constitutional: Negative for fever and chills.  HENT: Negative.   Eyes: Negative for blurred vision.  Respiratory: as per HPI  Cardiovascular: Negative for chest pain and palpitations.  Gastrointestinal: Negative for vomiting, diarrhea, blood per rectum. Genitourinary: Negative for dysuria, urgency, frequency and hematuria.  Musculoskeletal: Negative for myalgias, back pain and joint pain.  Skin: Negative for itching and rash.  Neurological: Negative for dizziness, tremors, focal weakness, seizures and loss of consciousness.  Endo/Heme/Allergies: Negative for environmental allergies.  Psychiatric/Behavioral: Negative for depression, suicidal ideas and hallucinations.  All other systems reviewed and are negative.  Physical Exam: Blood pressure 130/80, pulse 84, height 5' 11"  (1.803 m), weight 187 lb 3.2 oz (84.9 kg), SpO2 98 %. Gen:      No acute distress HEENT:  EOMI, sclera anicteric Neck:     No masses; no thyromegaly Lungs:    Clear to auscultation bilaterally; normal respiratory effort CV:         Regular rate and rhythm; no murmurs Abd:      + bowel sounds; soft, non-tender; no palpable masses, no distension Ext:    No edema; adequate peripheral perfusion Skin:      Warm and dry; no rash Neuro: alert  and oriented x 3 Psych: normal mood and affect  Data Reviewed: Chest x-ray 04/19/17-no active cardiopulmonary disease CT abdomen pelvis 12/21/16- 0.6 mm groundglass nodule in the right lower lobe. Evidence of prior granulomatous disease CT chest 02/17/10-bilateral calcified granulomas, calcified hilar lymph nodes CT scan 05/21/09-upper lobe groundglass opacities, bilateral calcified granuloma, calcified hilar lymph nodes. I have reviewed the images personally.  Report from [facility] CT chest 04/09/17 No axillary, mediastinal or hilar mass or adenopathy. no evidence of thoracic aortic aneurysm or dissection No filling defects in pulmonary arteries Extensive bilateral alveolar infiltrates. No mass, consolidation, effusion Bilateral granulomas, calcified hilar and mediastinal nodes Report scanned into the computer.  Assessment:  Evaluation for abnormal CT scan Prior granulomatous disease She has imaging dating back to 2010 with calcified granulomas, calcified hilar lymph nodes indicative of old infection. This may be from past infection with fungal disease such as coccidio, blato or histo as she has stayed in the [place].  She had a episode of respiratory failure with bilateral infiltrates earlier this year but this has cleared up on subsequent chest x-ray. She is still trying to get the images of her CT from [facility] for my review. I'll also order a high-resolution CT for further evaluation. If there is any evidence of active ongoing inflammation, infiltrate then we will consider bronchoscopy.  Plan/Recommendations: - High res CT of chest - Obtain images of recent CT scan chest  Marshell Garfinkel MD [facility] Pager [phone] 07/12/2017, 3:37 PM  CC: Renato Shin, MD

## 2017-07-12 NOTE — Patient Instructions (Signed)
We will schedule you for a high-resolution CT of chest Please see if you can get the images of your old CT  Return to clinic in 1-2 months.

## 2017-07-22 ENCOUNTER — Other Ambulatory Visit: Payer: Self-pay | Admitting: Endocrinology

## 2017-07-22 ENCOUNTER — Ambulatory Visit (INDEPENDENT_AMBULATORY_CARE_PROVIDER_SITE_OTHER)
Admission: RE | Admit: 2017-07-22 | Discharge: 2017-07-22 | Disposition: A | Discharge status: Home / Self Care | Payer: 59 | Source: Ambulatory Visit | Attending: Pulmonary Disease | Admitting: Pulmonary Disease

## 2017-07-22 DIAGNOSIS — D1779 Benign lipomatous neoplasm of other sites: Secondary | ICD-10-CM | POA: Diagnosis not present

## 2017-07-22 DIAGNOSIS — E119 Type 2 diabetes mellitus without complications: Secondary | ICD-10-CM | POA: Diagnosis not present

## 2017-07-22 DIAGNOSIS — D179 Benign lipomatous neoplasm, unspecified: Secondary | ICD-10-CM | POA: Insufficient documentation

## 2017-07-22 DIAGNOSIS — J40 Bronchitis, not specified as acute or chronic: Secondary | ICD-10-CM | POA: Diagnosis not present

## 2017-07-22 DIAGNOSIS — J849 Interstitial pulmonary disease, unspecified: Secondary | ICD-10-CM | POA: Diagnosis not present

## 2017-07-22 DIAGNOSIS — D1730 Benign lipomatous neoplasm of skin and subcutaneous tissue of unspecified sites: Secondary | ICD-10-CM | POA: Diagnosis not present

## 2017-07-23 ENCOUNTER — Inpatient Hospital Stay: Admission: RE | Admit: 2017-07-23 | Payer: 59 | Source: Ambulatory Visit

## 2017-07-25 DIAGNOSIS — M109 Gout, unspecified: Secondary | ICD-10-CM | POA: Diagnosis not present

## 2017-07-25 DIAGNOSIS — S2341XA Sprain of ribs, initial encounter: Secondary | ICD-10-CM | POA: Diagnosis not present

## 2017-07-25 DIAGNOSIS — M2550 Pain in unspecified joint: Secondary | ICD-10-CM | POA: Diagnosis not present

## 2017-07-25 DIAGNOSIS — M223X2 Other derangements of patella, left knee: Secondary | ICD-10-CM | POA: Diagnosis not present

## 2017-08-01 ENCOUNTER — Other Ambulatory Visit: Payer: Self-pay | Admitting: Endocrinology

## 2017-08-03 DIAGNOSIS — A499 Bacterial infection, unspecified: Secondary | ICD-10-CM | POA: Diagnosis not present

## 2017-08-03 DIAGNOSIS — N390 Urinary tract infection, site not specified: Secondary | ICD-10-CM | POA: Diagnosis not present

## 2017-08-03 DIAGNOSIS — R300 Dysuria: Secondary | ICD-10-CM | POA: Diagnosis not present

## 2017-08-07 ENCOUNTER — Ambulatory Visit: Payer: 59 | Admitting: Endocrinology

## 2017-08-09 ENCOUNTER — Ambulatory Visit: Payer: 59 | Admitting: Endocrinology

## 2017-08-16 ENCOUNTER — Other Ambulatory Visit (INDEPENDENT_AMBULATORY_CARE_PROVIDER_SITE_OTHER): Payer: 59

## 2017-08-16 ENCOUNTER — Encounter: Payer: Self-pay | Admitting: Nurse Practitioner

## 2017-08-16 ENCOUNTER — Ambulatory Visit (INDEPENDENT_AMBULATORY_CARE_PROVIDER_SITE_OTHER): Payer: 59 | Admitting: Nurse Practitioner

## 2017-08-16 ENCOUNTER — Ambulatory Visit: Payer: 59 | Admitting: Pulmonary Disease

## 2017-08-16 VITALS — BP 118/70 | HR 69 | Temp 97.9°F | Ht 71.0 in | Wt 182.0 lb

## 2017-08-16 DIAGNOSIS — M2550 Pain in unspecified joint: Secondary | ICD-10-CM

## 2017-08-16 LAB — URIC ACID: Uric Acid, Serum: 4.7 mg/dL (ref 2.4–7.0)

## 2017-08-16 NOTE — Patient Instructions (Addendum)
Normal uric acid level, negative RA factor and ANA.

## 2017-08-16 NOTE — Progress Notes (Signed)
Subjective:  Patient ID: Savannah [name], female    DOB: 09/27/1956  Age: 61 y.o. MRN: [id number]  CC: Leg Pain (right ankle pain down to big toes,swelling. sore to touch---going on for 5 days. gout consult?)   Ankle Pain   The incident occurred more than 1 week ago. There was no injury mechanism. The pain is present in the left ankle. The quality of the pain is described as aching and shooting. The pain has been intermittent since onset. Associated symptoms include an inability to bear weight. Pertinent negatives include no numbness or tingling. The symptoms are aggravated by palpation and weight bearing. She has tried nothing for the symptoms.  pain is not resolved per patient.  Also complains of multiple joint pain. Will like to be tested for Rheumatoid arthritis. No FH of RA or any autoimmune disease. FH of OA.  Outpatient Medications Prior to Visit  Medication Sig Dispense Refill  . amoxicillin (AMOXIL) 500 MG capsule TAKE 4 CAPSULES 1 HOUR BEFORE DEWNTAL APPOINTMENT  1  . Cholecalciferol (VITAMIN D3) 400 UNITS tablet Take 400 Units by mouth daily.      [name] docusate sodium (COLACE) 250 MG capsule Take 500 mg by mouth 2 (two) times daily.      . Flaxseed, Linseed, (FLAX SEED OIL PO) Take 1 capsule by mouth daily.      . fluticasone (FLONASE) 50 MCG/ACT nasal spray Place 2 sprays into both nostrils daily.    [name] gabapentin (NEURONTIN) 300 MG capsule TAKE 2 CAPSULES BY MOUTH TWICE A DAY 360 capsule 2  . halobetasol (ULTRAVATE) 0.05 % cream APPLY TO AFFECTED AREA TWICE A DAY AS NEEDED  2  . HYDROcodone-acetaminophen (NORCO/VICODIN) 5-325 MG tablet Take 1 tablet by mouth every 6 (six) hours as needed for moderate pain. 50 tablet 0  . hydrOXYzine (ATARAX/VISTARIL) 25 MG tablet Take 25 mg by mouth 3 (three) times daily as needed.    . INVOKANA 300 MG TABS tablet TAKE 1 TABLET (300 MG TOTAL) BY MOUTH DAILY BEFORE BREAKFAST. 30 tablet 1  . levocetirizine (XYZAL) 5 MG tablet Take 5 mg by mouth every  evening.    [name] levothyroxine (SYNTHROID, LEVOTHROID) 125 MCG tablet Take 1 tablet (125 mcg total) by mouth daily before breakfast. 90 tablet 3  . linagliptin (TRADJENTA) 5 MG TABS tablet Take 1 tablet (5 mg total) by mouth daily. 30 tablet 11  . metFORMIN (GLUCOPHAGE-XR) 500 MG 24 hr tablet TAKE 2 TABLETS TWICE A DAY 360 tablet 0  . Multiple Vitamin (MULTIVITAMIN) tablet Take 1 tablet by mouth 2 (two) times daily.     . ONE TOUCH ULTRA TEST test strip USE TO CHECK BLOOD SUGAR 1 TIME PER DAY. 100 each 3  . pantoprazole (PROTONIX) 40 MG tablet TAKE 1 TABLET (40 MG TOTAL) BY MOUTH DAILY. 90 tablet 2  . PENNSAID 2 % SOLN apply two pumps TO affected area(s) three TIMES dailly  2  . repaglinide (PRANDIN) 2 MG tablet TAKE 1 TABLET BY MOUTH 3 TIMES A DAY BEFORE MEALS 180 tablet 2  . simvastatin (ZOCOR) 40 MG tablet TAKE 1 TABLET BY MOUTH EVERY DAY AT BEDTIME 90 tablet 1  . sucralfate (CARAFATE) 1 GM/10ML suspension Take 10 mLs (1 g total) by mouth 4 (four) times daily. 420 mL 1  . topiramate (TOPAMAX) 50 MG tablet TAKE 1 TABLET BY MOUTH TWICE A DAY 60 tablet 0  . traMADol (ULTRAM) 50 MG tablet Take 1 tablet (50 mg total) by mouth every 6 (  six) hours as needed. 100 tablet 2  . traMADol (ULTRAM) 50 MG tablet tramadol 50 mg tablet  TAKE 1 TABLET BY MOUTH EVERY 6 HOURS AS NEEDED     Facility-Administered Medications Prior to Visit  Medication Dose Route Frequency Provider Last Rate Last Dose  . 0.9 %  sodium chloride infusion  500 mL Intravenous Continuous Armbruster, Carlota Raspberry, MD        ROS See HPI  Objective:  BP 118/70   Pulse 69   Temp 97.9 F (36.6 C)   Ht 5\' 11"  (1.803 m)   Wt 182 lb (82.6 kg)   SpO2 98%   BMI 25.38 kg/m   BP Readings from Last 3 Encounters:  08/16/17 118/70  07/12/17 130/80  06/07/17 118/70    Wt Readings from Last 3 Encounters:  08/16/17 182 lb (82.6 kg)  07/12/17 187 lb 3.2 oz (84.9 kg)  06/07/17 185 lb (83.9 kg)    Physical Exam  Constitutional: She is  oriented to person, place, and time.  Cardiovascular: Normal rate.   Pulmonary/Chest: Effort normal.  Musculoskeletal: She exhibits edema. She exhibits no tenderness or deformity.       Left ankle: Normal.       Left foot: Normal.  Bilateral ankle edema (chronic per patient)  Neurological: She is alert and oriented to person, place, and time.  Skin: Skin is warm and dry. No erythema.  Vitals reviewed.   Lab Results  Component Value Date   WBC 3.5 (L) 06/07/2017   HGB 12.6 06/07/2017   HCT 39.1 06/07/2017   PLT 144.0 (L) 06/07/2017   GLUCOSE 165 (H) 06/07/2017   CHOL 169 01/11/2017   TRIG 114.0 01/11/2017   HDL 46.80 01/11/2017   LDLDIRECT 99.0 10/12/2011   LDLCALC 99 01/11/2017   ALT 25 01/11/2017   AST 20 01/11/2017   NA 141 06/07/2017   K 3.8 06/07/2017   CL 109 06/07/2017   CREATININE 0.89 06/07/2017   BUN 18 06/07/2017   CO2 26 06/07/2017   TSH 5.38 (H) 01/11/2017   INR 1.6 (H) 10/22/2008   HGBA1C 6.3 06/07/2017   MICROALBUR <0.7 01/11/2017    Ct Chest High Resolution  Result Date: 07/22/2017 CLINICAL DATA:  Recurrent bronchitis, abnormal chest radiograph. EXAM: CT CHEST WITHOUT CONTRAST TECHNIQUE: Multidetector CT imaging of the chest was performed following the standard protocol without intravenous contrast. High resolution imaging of the lungs, as well as inspiratory and expiratory imaging, was performed. COMPARISON:  02/17/2010. FINDINGS: Cardiovascular: Atherosclerotic calcification of the arterial vasculature, including three-vessel involvement of the coronary arteries. Heart size normal. No pericardial effusion. Mediastinum/Nodes: Thyroidectomy. Calcified mediastinal and hilar lymph nodes. No pathologically enlarged mediastinal or axillary lymph nodes. Hilar regions are difficult to further evaluate without IV contrast. Esophagus is grossly unremarkable. Lungs/Pleura: A few noncalcified pulmonary nodules measure 4 mm or less in size, stable and considered benign.  Scattered calcified granulomas. No pleural fluid. Airway is unremarkable. Upper Abdomen: Visualized portions of the liver, adrenal glands, left kidney, spleen and pancreas are grossly unremarkable. Gastric bypass. Musculoskeletal: Degenerative changes in the spine. IMPRESSION: 1. No acute pulmonary parenchymal findings. 2. Aortic atherosclerosis (ICD10-170.0). Three-vessel coronary artery calcification. Electronically Signed   By: Lorin Picket M.D.   On: 07/22/2017 14:48    Assessment & Plan:   Savannah [name] was seen today for leg pain.  Diagnoses and all orders for this visit:  Generalized joint pain -     Uric acid; Future -     Rheumatoid Factor;  Future -     Antinuclear Antib (ANA); Future   I am having Ms. Amiri maintain her Vitamin D3, docusate sodium, (Flaxseed, Linseed, (FLAX SEED OIL PO)), multivitamin, fluticasone, linagliptin, ONE TOUCH ULTRA TEST, levocetirizine, hydrOXYzine, levothyroxine, PENNSAID, halobetasol, sucralfate, pantoprazole, repaglinide, traMADol, HYDROcodone-acetaminophen, gabapentin, INVOKANA, metFORMIN, traMADol, amoxicillin, simvastatin, and topiramate. We will continue to administer sodium chloride.  No orders of the defined types were placed in this encounter.   Follow-up: No Follow-up on file.  Wilfred Lacy, NP

## 2017-08-19 LAB — RHEUMATOID FACTOR: Rhuematoid fact SerPl-aCnc: <14 IU/mL (ref <14)

## 2017-08-19 LAB — ANA: Anti Nuclear Antibody(ANA): NEGATIVE

## 2017-08-30 ENCOUNTER — Other Ambulatory Visit: Payer: Self-pay | Admitting: Endocrinology

## 2017-08-30 ENCOUNTER — Ambulatory Visit: Payer: 59 | Admitting: Pulmonary Disease

## 2017-08-31 ENCOUNTER — Other Ambulatory Visit: Payer: Self-pay | Admitting: Endocrinology

## 2017-09-02 ENCOUNTER — Other Ambulatory Visit: Payer: Self-pay | Admitting: Endocrinology

## 2017-09-06 ENCOUNTER — Ambulatory Visit (INDEPENDENT_AMBULATORY_CARE_PROVIDER_SITE_OTHER): Payer: 59 | Admitting: Endocrinology

## 2017-09-06 ENCOUNTER — Encounter: Payer: Self-pay | Admitting: Endocrinology

## 2017-09-06 VITALS — BP 120/68 | HR 73 | Wt 188.2 lb

## 2017-09-06 DIAGNOSIS — Z794 Long term (current) use of insulin: Secondary | ICD-10-CM

## 2017-09-06 DIAGNOSIS — E119 Type 2 diabetes mellitus without complications: Secondary | ICD-10-CM

## 2017-09-06 LAB — POCT GLYCOSYLATED HEMOGLOBIN (HGB A1C): Hemoglobin A1C: 7

## 2017-09-06 MED ORDER — DICLOFENAC SODIUM 1 % TD GEL: 4.0000 g | Freq: Four times a day (QID) | TRANSDERMAL | 11 refills | Status: AC

## 2017-09-06 MED ORDER — DULAGLUTIDE 1.5 MG/0.5ML ~~LOC~~ SOAJ: 1.5000 mg | SUBCUTANEOUS | 11 refills | Status: DC

## 2017-09-06 NOTE — Patient Instructions (Addendum)
Please continue the same medications for diabetes.   I have sent a prescription to your pharmacy, for the voltaren gel, and to change tradjecta to "trulicity."  Please come back for a follow-up appointment in 4 months.

## 2017-09-06 NOTE — Progress Notes (Signed)
Subjective:    Patient ID: Savannah [name], female    DOB: 07-04-1956, 61 y.o.   MRN: [id number]  HPI Pt returns for f/u of diabetes mellitus:  DM type: 2 Dx'ed: 8182 Complications: painful polyneuropathy Therapy: 4 oral meds GDM: never DKA: never Severe hypoglycemia: never.   Pancreatitis: never.  Other: she took insulin from 2004-2011, when she had gastric bypass surgery.   Interval history: she takes meds as rx'ed.  Edema persists  Chronic low-back pain and neuropathic leg persists (she takes topirimate; she can't take PO NSAID, due to gastric bypass).  She seldom takes vicodin.  She tried voltaren gel from a friend, and it worked well.   Past Medical History:  Diagnosis Date  . ABDOMINAL PAIN, CHRONIC 10/20/2009  . Acute cystitis 08/17/2009  . ALLERGIC RHINITIS 11/21/2007  . Allergy   . ASYMPTOMATIC POSTMENOPAUSAL STATUS 09/29/2008  . Bariatric surgery status 07/07/2010  . Cancer ([place])    uterine  . Cataract   . DEPRESSION 11/21/2007  . DIABETES MELLITUS, TYPE II 07/08/2007  . DYSPNEA 05/20/2009  . Edema 05/20/2009  . Eosinophilic esophagitis 07/22/3715  . FEVER UNSPECIFIED 10/20/2009  . FEVER, HX OF 03/09/2010  . GERD (gastroesophageal reflux disease)   . Headache(784.0) 02/06/2010  . Hepatomegaly 01/06/2008  . HYPERLIPIDEMIA 07/08/2007  . HYPERTENSION 07/08/2007  . LEUKOPENIA, MILD 09/29/2008  . OBESITY 01/06/2008  . OSTEOARTHRITIS 01/06/2008  . Other chronic nonalcoholic liver disease 9/67/8938  . PERIPHERAL NEUROPATHY 11/21/2007  . Peripheral neuropathy   . Postsurgical hypothyroidism 09/19/2010  . SINUSITIS- ACUTE-NOS 11/21/2007  . TB SKIN TEST, POSITIVE 02/06/2010  . THYROID NODULE 03/09/2010    Past Surgical History:  Procedure Laterality Date  . ABDOMINAL HYSTERECTOMY    . COLONOSCOPY    . KNEE ARTHROSCOPY Left   . OOPHORECTOMY    . Right total knee replacement    . ROUX-EN-Y GASTRIC BYPASS  2011  . SKIN TAG REMOVAL    . THYROIDECTOMY    . TONSILLECTOMY AND ADENOIDECTOMY     . UPPER GASTROINTESTINAL ENDOSCOPY      Social History   Social History  . Marital status: Single    Spouse name: N/A  . Number of children: 0  . Years of education: N/A   Occupational History  . consultant    Social History Main Topics  . Smoking status: Former Smoker    Packs/day: 1.50    Years: 15.00    Types: Cigarettes  . Smokeless tobacco: Never Used  . Alcohol use 0.0 oz/week     Comment: rarely  . Drug use: No  . Sexual activity: Not on file   Other Topics Concern  . Not on file   Social History Narrative  . No narrative on file    Current Outpatient Prescriptions on File Prior to Visit  Medication Sig Dispense Refill  . Cholecalciferol (VITAMIN D3) 400 UNITS tablet Take 400 Units by mouth daily.      [name] docusate sodium (COLACE) 250 MG capsule Take 500 mg by mouth 2 (two) times daily.      . Flaxseed, Linseed, (FLAX SEED OIL PO) Take 1 capsule by mouth daily.      . fluticasone (FLONASE) 50 MCG/ACT nasal spray Place 2 sprays into both nostrils daily.    [name] gabapentin (NEURONTIN) 300 MG capsule TAKE 2 CAPSULES BY MOUTH TWICE A DAY 360 capsule 2  . halobetasol (ULTRAVATE) 0.05 % cream APPLY TO AFFECTED AREA TWICE A DAY AS NEEDED  2  .  HYDROcodone-acetaminophen (NORCO/VICODIN) 5-325 MG tablet Take 1 tablet by mouth every 6 (six) hours as needed for moderate pain. 50 tablet 0  . hydrOXYzine (ATARAX/VISTARIL) 25 MG tablet Take 25 mg by mouth 3 (three) times daily as needed.    . INVOKANA 300 MG TABS tablet TAKE 1 TABLET (300 MG TOTAL) BY MOUTH DAILY BEFORE BREAKFAST. 30 tablet 1  . levocetirizine (XYZAL) 5 MG tablet Take 5 mg by mouth every evening.    [name] levothyroxine (SYNTHROID, LEVOTHROID) 125 MCG tablet Take 1 tablet (125 mcg total) by mouth daily before breakfast. 90 tablet 3  . metFORMIN (GLUCOPHAGE-XR) 500 MG 24 hr tablet TAKE 2 TABLETS TWICE A DAY 360 tablet 0  . Multiple Vitamin (MULTIVITAMIN) tablet Take 1 tablet by mouth 2 (two) times daily.     . ONE TOUCH  ULTRA TEST test strip USE TO CHECK BLOOD SUGAR 1 TIME PER DAY. 100 each 3  . pantoprazole (PROTONIX) 40 MG tablet TAKE 1 TABLET (40 MG TOTAL) BY MOUTH DAILY. 90 tablet 2  . repaglinide (PRANDIN) 2 MG tablet TAKE 1 TABLET BY MOUTH 3 TIMES A DAY BEFORE MEALS 180 tablet 2  . simvastatin (ZOCOR) 40 MG tablet TAKE 1 TABLET BY MOUTH EVERY DAY AT BEDTIME 90 tablet 1  . topiramate (TOPAMAX) 50 MG tablet TAKE 1 TABLET BY MOUTH TWICE A DAY 60 tablet 0  . traMADol (ULTRAM) 50 MG tablet Take 1 tablet (50 mg total) by mouth every 6 (six) hours as needed. 100 tablet 2   Current Facility-Administered Medications on File Prior to Visit  Medication Dose Route Frequency Provider Last Rate Last Dose  . 0.9 %  sodium chloride infusion  500 mL Intravenous Continuous Armbruster, Carlota Raspberry, MD        Allergies  Allergen Reactions  . Ace Inhibitors     REACTION: Cough  . Caffeine-Sodium Benzoate     REACTION: PVCs  . Ciprofloxacin Itching  . Nsaids     Gastric Bypass Surgery - unable to take    Family History  Problem Relation Age of Onset  . Diabetes Mother   . Diabetes Father   . Liver cancer Maternal Aunt        2 aunt  . Colon cancer Maternal Uncle     BP 120/68   Pulse 73   Wt 188 lb 3.2 oz (85.4 kg)   SpO2 98%   BMI 26.25 kg/m   Review of Systems She seldom has hypoglycemia, and these episodes are mild.     Objective:   Physical Exam VITAL SIGNS:  See vs page GENERAL: no distress Pulses: foot pulses are intact bilaterally.   MSK: no deformity of the feet or ankles.  CV: 1+ left leg edema (trace on the right) Skin:  no ulcer on the feet or ankles.  normal color and temp on the feet and ankles Neuro: sensation is intact to touch on the feet and ankles.   A1c=7.0%     Assessment & Plan:  Type 2 DM: worse Chronic pain (back and neuropathic), persistent  Patient Instructions  Please continue the same medications for diabetes.   I have sent a prescription to your pharmacy, for the  voltaren gel, and to change tradjecta to "trulicity."  Please come back for a follow-up appointment in 4 months.

## 2017-09-15 ENCOUNTER — Other Ambulatory Visit: Payer: Self-pay | Admitting: Endocrinology

## 2017-09-21 ENCOUNTER — Other Ambulatory Visit: Payer: Self-pay | Admitting: Endocrinology

## 2017-10-19 DIAGNOSIS — Z23 Encounter for immunization: Secondary | ICD-10-CM | POA: Diagnosis not present

## 2017-10-29 ENCOUNTER — Other Ambulatory Visit: Payer: Self-pay | Admitting: Endocrinology

## 2017-10-30 ENCOUNTER — Other Ambulatory Visit: Payer: Self-pay | Admitting: Endocrinology

## 2017-10-31 ENCOUNTER — Other Ambulatory Visit: Payer: Self-pay | Admitting: Endocrinology

## 2017-11-01 ENCOUNTER — Other Ambulatory Visit: Payer: Self-pay | Admitting: Endocrinology

## 2017-11-07 ENCOUNTER — Ambulatory Visit (INDEPENDENT_AMBULATORY_CARE_PROVIDER_SITE_OTHER)
Admission: RE | Admit: 2017-11-07 | Discharge: 2017-11-07 | Disposition: A | Discharge status: Home / Self Care | Payer: 59 | Source: Ambulatory Visit | Attending: Family Medicine | Admitting: Family Medicine

## 2017-11-07 ENCOUNTER — Ambulatory Visit: Payer: 59 | Admitting: Family Medicine

## 2017-11-07 ENCOUNTER — Encounter: Payer: Self-pay | Admitting: Family Medicine

## 2017-11-07 ENCOUNTER — Other Ambulatory Visit (INDEPENDENT_AMBULATORY_CARE_PROVIDER_SITE_OTHER): Payer: 59

## 2017-11-07 VITALS — BP 128/76 | HR 85 | Temp 98.3°F | Ht 71.0 in | Wt 176.0 lb

## 2017-11-07 DIAGNOSIS — M79642 Pain in left hand: Secondary | ICD-10-CM

## 2017-11-07 DIAGNOSIS — M12842 Other specific arthropathies, not elsewhere classified, left hand: Secondary | ICD-10-CM | POA: Diagnosis not present

## 2017-11-07 LAB — C-REACTIVE PROTEIN: CRP: 0.3 mg/dL — ABNORMAL LOW (ref 0.5–20.0)

## 2017-11-07 LAB — SEDIMENTATION RATE: Sed Rate: 32 mm/hr — ABNORMAL HIGH (ref 0–30)

## 2017-11-07 MED ORDER — METHYLPREDNISOLONE ACETATE 40 MG/ML IJ SUSP
40.0000 mg | Freq: Once | INTRAMUSCULAR | Status: AC
  Administered 2017-11-07: 40 mg via INTRAMUSCULAR

## 2017-11-07 NOTE — Progress Notes (Signed)
Savannah [name] - 61 y.o. female MRN [id number]  Date of birth: 11/21/1955  SUBJECTIVE:  Including CC & ROS.  Chief Complaint  Patient presents with  . Left hand pain    Savannah [name] is a 61 y.o. female that is  Presenting with left dorsal wrist/hand pain. Pain has been present for five days. Pain is located over the base of the 5th metacarpal and ulnar sided dorsal carpal bones of the  left hand. She did go to the Urgent Care in [place] in September with similar pain, she received a steroid injection which improved the pain. Admits to swelling. She has been wearing a brace to help keep her hand stable. She is having intense pain. The pain is localized. She was told she had a gout flare. No significant history of prior flares. Pain is worst when it is touched and when she moves her wrist/hand. No injury. Having some redness of her wrist/hand but no fever or chills.   Review of her Hgb A1c from 10/26 was 7.0.   Review of her blood work from 10/5 shows normal uric acid of 4.7, normal ANA and RA.   Review of her TSh from 3/2 shows an elevated TSH of 5.38.   Review of Systems  Constitutional: Negative for fever.  Respiratory: Negative for cough.   Cardiovascular: Negative for chest pain.  Gastrointestinal: Negative for abdominal pain.  Musculoskeletal: Positive for joint swelling.  Skin: Positive for color change.  Neurological: Negative for weakness.  Hematological: Negative for adenopathy.  Psychiatric/Behavioral: Negative for agitation.    HISTORY: Past Medical, Surgical, Social, and Family History Reviewed & Updated per EMR.   Pertinent Historical Findings include:  Past Medical History:  Diagnosis Date  . ABDOMINAL PAIN, CHRONIC 10/20/2009  . Acute cystitis 08/17/2009  . ALLERGIC RHINITIS 11/21/2007  . Allergy   . ASYMPTOMATIC POSTMENOPAUSAL STATUS 09/29/2008  . Bariatric surgery status 07/07/2010  . Cancer ([place])    uterine  . Cataract   . DEPRESSION 11/21/2007  . DIABETES MELLITUS,  TYPE II 07/08/2007  . DYSPNEA 05/20/2009  . Edema 05/20/2009  . Eosinophilic esophagitis 0/0/3491  . FEVER UNSPECIFIED 10/20/2009  . FEVER, HX OF 03/09/2010  . GERD (gastroesophageal reflux disease)   . Headache(784.0) 02/06/2010  . Hepatomegaly 01/06/2008  . HYPERLIPIDEMIA 07/08/2007  . HYPERTENSION 07/08/2007  . LEUKOPENIA, MILD 09/29/2008  . OBESITY 01/06/2008  . OSTEOARTHRITIS 01/06/2008  . Other chronic nonalcoholic liver disease 7/91/5056  . PERIPHERAL NEUROPATHY 11/21/2007  . Peripheral neuropathy   . Postsurgical hypothyroidism 09/19/2010  . SINUSITIS- ACUTE-NOS 11/21/2007  . TB SKIN TEST, POSITIVE 02/06/2010  . THYROID NODULE 03/09/2010    Past Surgical History:  Procedure Laterality Date  . ABDOMINAL HYSTERECTOMY    . COLONOSCOPY    . KNEE ARTHROSCOPY Left   . OOPHORECTOMY    . Right total knee replacement    . ROUX-EN-Y GASTRIC BYPASS  2011  . SKIN TAG REMOVAL    . THYROIDECTOMY    . TONSILLECTOMY AND ADENOIDECTOMY    . UPPER GASTROINTESTINAL ENDOSCOPY      Allergies  Allergen Reactions  . Ace Inhibitors     REACTION: Cough  . Caffeine-Sodium Benzoate     REACTION: PVCs  . Ciprofloxacin Itching  . Nsaids     Gastric Bypass Surgery - unable to take    Family History  Problem Relation Age of Onset  . Diabetes Mother   . Diabetes Father   . Liver cancer Maternal Aunt  2 aunt  . Colon cancer Maternal Uncle      Social History   Socioeconomic History  . Marital status: Single    Spouse name: Not on file  . Number of children: 0  . Years of education: Not on file  . Highest education level: Not on file  Social Needs  . Financial resource strain: Not on file  . Food insecurity - worry: Not on file  . Food insecurity - inability: Not on file  . Transportation needs - medical: Not on file  . Transportation needs - non-medical: Not on file  Occupational History  . Occupation: Optometrist  Tobacco Use  . Smoking status: Former Smoker    Packs/day: 1.50     Years: 15.00    Pack years: 22.50    Types: Cigarettes  . Smokeless tobacco: Never Used  Substance and Sexual Activity  . Alcohol use: Yes    Alcohol/week: 0.0 oz    Comment: rarely  . Drug use: No  . Sexual activity: Not on file  Other Topics Concern  . Not on file  Social History Narrative  . Not on file     PHYSICAL EXAM:  VS: BP 128/76 (BP Location: Left Arm, Patient Position: Sitting, Cuff Size: Normal)   Pulse 85   Temp 98.3 F (36.8 C) (Oral)   Ht _0  (1.803 m)   Wt 176 lb (79.8 kg)   SpO2 99%   BMI 24.55 kg/m  Physical Exam Gen: NAD, alert, cooperative with exam, well-appearing ENT: normal lips, normal nasal mucosa,  Eye: normal EOM, normal conjunctiva and lids CV:  no edema, +2 pedal pulses   Resp: no accessory muscle use, non-labored,  GI: no masses or tenderness, no hernia  Skin: no rashes, no areas of induration  Neuro: normal tone, normal sensation to touch Psych:  normal insight, alert and oriented MSK:  Left hand/wrist:  Dorsal, ulnar sided redness and swelling near the base of the 5th Metacarpal  Pain with wrist ROM  Normal finger adduction and abduction  TTP of the area with redness and swelling.  No streaking  Normal pincer grasp Normal elbow ROM  Neurovascularly intact.   Limited ultrasound: left wrist :  There is soft tissue swelling near the base of the 5th metacarpal and there appears to be suggestions of crystals in this area.  Negative for fracture  Normal appearing dorsal compartment tendons   Summary: suggestions of gout   Ultrasound and interpretation by Clearance Coots, MD         ASSESSMENT & PLAN:   Left hand pain Her response to the steroid that she had a gout flare. There are findings on [place] to suggest gout as well. Uric acid was normal.  - IM depo. Doesn't tolerate NSAIDS with a history of Roux-en-Y  - xray  - crp, esr, anti-CCP  - called and informed of results.

## 2017-11-07 NOTE — Patient Instructions (Signed)
We will call you with the results from today

## 2017-11-08 DIAGNOSIS — Z1389 Encounter for screening for other disorder: Secondary | ICD-10-CM | POA: Diagnosis not present

## 2017-11-08 DIAGNOSIS — L900 Lichen sclerosus et atrophicus: Secondary | ICD-10-CM | POA: Diagnosis not present

## 2017-11-08 DIAGNOSIS — M79642 Pain in left hand: Secondary | ICD-10-CM | POA: Insufficient documentation

## 2017-11-08 LAB — CYCLIC CITRUL PEPTIDE ANTIBODY, IGG: Cyclic Citrullin Peptide Ab: <16 UNITS

## 2017-11-08 NOTE — Assessment & Plan Note (Signed)
Her response to the steroid that she had a gout flare. There are findings on [place] to suggest gout as well. Uric acid was normal.  - IM depo. Doesn't tolerate NSAIDS with a history of Roux-en-Y  - xray  - crp, esr, anti-CCP  - called and informed of results.

## 2017-11-19 ENCOUNTER — Other Ambulatory Visit: Payer: Self-pay

## 2017-11-19 MED ORDER — TOPIRAMATE 50 MG PO TABS: 50.0000 mg | ORAL_TABLET | Freq: Two times a day (BID) | ORAL | 0 refills | Status: DC

## 2017-11-20 ENCOUNTER — Other Ambulatory Visit: Payer: Self-pay

## 2017-11-20 MED ORDER — SIMVASTATIN 40 MG PO TABS: 40.0000 mg | ORAL_TABLET | Freq: Every day | ORAL | 1 refills | Status: DC

## 2017-11-25 ENCOUNTER — Other Ambulatory Visit: Payer: Self-pay

## 2017-11-25 MED ORDER — CANAGLIFLOZIN 300 MG PO TABS: 300.0000 mg | ORAL_TABLET | Freq: Every day | ORAL | 1 refills | Status: DC

## 2017-11-28 ENCOUNTER — Other Ambulatory Visit: Payer: Self-pay

## 2017-11-28 MED ORDER — CANAGLIFLOZIN 300 MG PO TABS: 300.0000 mg | ORAL_TABLET | Freq: Every day | ORAL | 0 refills | Status: DC

## 2017-12-13 ENCOUNTER — Other Ambulatory Visit: Payer: Self-pay

## 2017-12-13 MED ORDER — METFORMIN HCL ER 500 MG PO TB24: 1000.0000 mg | ORAL_TABLET | Freq: Two times a day (BID) | ORAL | 0 refills | Status: DC

## 2017-12-27 ENCOUNTER — Encounter: Payer: Self-pay | Admitting: Endocrinology

## 2017-12-27 ENCOUNTER — Ambulatory Visit: Payer: 59 | Admitting: Endocrinology

## 2017-12-27 VITALS — BP 122/80 | HR 81 | Wt 182.4 lb

## 2017-12-27 DIAGNOSIS — M79642 Pain in left hand: Secondary | ICD-10-CM | POA: Diagnosis not present

## 2017-12-27 DIAGNOSIS — Z794 Long term (current) use of insulin: Secondary | ICD-10-CM

## 2017-12-27 DIAGNOSIS — E119 Type 2 diabetes mellitus without complications: Secondary | ICD-10-CM

## 2017-12-27 LAB — POCT GLYCOSYLATED HEMOGLOBIN (HGB A1C): Hemoglobin A1C: 6.5

## 2017-12-27 NOTE — Progress Notes (Signed)
Subjective:    Patient ID: Savannah [name], female    DOB: 04-Apr-1956, 62 y.o.   MRN: [id number]  HPI Pt returns for f/u of diabetes mellitus:  DM type: 2 Dx'ed: 6761 Complications: painful polyneuropathy Therapy: 4 oral meds GDM: never DKA: never Severe hypoglycemia: never.   Pancreatitis: never.  Other: she took insulin from 2004-2011, when she had gastric bypass surgery.   Interval history: she takes meds as rx'ed. Pt states few months of episodic pain at the left wrist, and assoc pain at the right foot (worst at the right great toe).   Past Medical History:  Diagnosis Date  . ABDOMINAL PAIN, CHRONIC 10/20/2009  . Acute cystitis 08/17/2009  . ALLERGIC RHINITIS 11/21/2007  . Allergy   . ASYMPTOMATIC POSTMENOPAUSAL STATUS 09/29/2008  . Bariatric surgery status 07/07/2010  . Cancer ([place])    uterine  . Cataract   . DEPRESSION 11/21/2007  . DIABETES MELLITUS, TYPE II 07/08/2007  . DYSPNEA 05/20/2009  . Edema 05/20/2009  . Eosinophilic esophagitis 07/18/931  . FEVER UNSPECIFIED 10/20/2009  . FEVER, HX OF 03/09/2010  . GERD (gastroesophageal reflux disease)   . Headache(784.0) 02/06/2010  . Hepatomegaly 01/06/2008  . HYPERLIPIDEMIA 07/08/2007  . HYPERTENSION 07/08/2007  . LEUKOPENIA, MILD 09/29/2008  . OBESITY 01/06/2008  . OSTEOARTHRITIS 01/06/2008  . Other chronic nonalcoholic liver disease 6/71/2458  . PERIPHERAL NEUROPATHY 11/21/2007  . Peripheral neuropathy   . Postsurgical hypothyroidism 09/19/2010  . SINUSITIS- ACUTE-NOS 11/21/2007  . TB SKIN TEST, POSITIVE 02/06/2010  . THYROID NODULE 03/09/2010    Past Surgical History:  Procedure Laterality Date  . ABDOMINAL HYSTERECTOMY    . COLONOSCOPY    . KNEE ARTHROSCOPY Left   . OOPHORECTOMY    . Right total knee replacement    . ROUX-EN-Y GASTRIC BYPASS  2011  . SKIN TAG REMOVAL    . THYROIDECTOMY    . TONSILLECTOMY AND ADENOIDECTOMY    . UPPER GASTROINTESTINAL ENDOSCOPY      Social History   Socioeconomic History  . Marital  status: Single    Spouse name: Not on file  . Number of children: 0  . Years of education: Not on file  . Highest education level: Not on file  Social Needs  . Financial resource strain: Not on file  . Food insecurity - worry: Not on file  . Food insecurity - inability: Not on file  . Transportation needs - medical: Not on file  . Transportation needs - non-medical: Not on file  Occupational History  . Occupation: Optometrist  Tobacco Use  . Smoking status: Former Smoker    Packs/day: 1.50    Years: 15.00    Pack years: 22.50    Types: Cigarettes  . Smokeless tobacco: Never Used  Substance and Sexual Activity  . Alcohol use: Yes    Alcohol/week: 0.0 oz    Comment: rarely  . Drug use: No  . Sexual activity: Not on file  Other Topics Concern  . Not on file  Social History Narrative  . Not on file    Current Outpatient Medications on File Prior to Visit  Medication Sig Dispense Refill  . canagliflozin (INVOKANA) 300 MG TABS tablet Take 1 tablet (300 mg total) by mouth daily before breakfast. 90 tablet 0  . Cholecalciferol (VITAMIN D3) 400 UNITS tablet Take 400 Units by mouth daily.      . diclofenac sodium (VOLTAREN) 1 % GEL Apply 4 g topically 4 (four) times daily. 100 g 11  .  docusate sodium (COLACE) 250 MG capsule Take 500 mg by mouth 2 (two) times daily.      . Dulaglutide (TRULICITY) 1.5 GL/8.7FI SOPN Inject 1.5 mg into the skin once a week. 12 pen 11  . Flaxseed, Linseed, (FLAX SEED OIL PO) Take 1 capsule by mouth daily.      . fluticasone (FLONASE) 50 MCG/ACT nasal spray Place 2 sprays into both nostrils daily.    [name] gabapentin (NEURONTIN) 300 MG capsule TAKE 2 CAPSULES BY MOUTH TWICE A DAY 360 capsule 2  . halobetasol (ULTRAVATE) 0.05 % cream APPLY TO AFFECTED AREA TWICE A DAY AS NEEDED  2  . HYDROcodone-acetaminophen (NORCO/VICODIN) 5-325 MG tablet Take 1 tablet by mouth every 6 (six) hours as needed for moderate pain. 50 tablet 0  . hydrOXYzine (ATARAX/VISTARIL) 25 MG  tablet Take 25 mg by mouth 3 (three) times daily as needed.    [name] levocetirizine (XYZAL) 5 MG tablet Take 5 mg by mouth every evening.    [name] levothyroxine (SYNTHROID, LEVOTHROID) 125 MCG tablet TAKE 1 TABLET (125 MCG TOTAL) BY MOUTH DAILY BEFORE BREAKFAST. 90 tablet 1  . metFORMIN (GLUCOPHAGE-XR) 500 MG 24 hr tablet Take 2 tablets (1,000 mg total) by mouth 2 (two) times daily. 360 tablet 0  . Multiple Vitamin (MULTIVITAMIN) tablet Take 1 tablet by mouth 2 (two) times daily.     . ONE TOUCH ULTRA TEST test strip USE TO CHECK BLOOD SUGAR 1 TIME PER DAY. 100 each 5  . pantoprazole (PROTONIX) 40 MG tablet TAKE 1 TABLET (40 MG TOTAL) BY MOUTH DAILY. 90 tablet 0  . repaglinide (PRANDIN) 2 MG tablet TAKE 1 TABLET BY MOUTH 3 TIMES A DAY BEFORE MEALS 180 tablet 2  . simvastatin (ZOCOR) 40 MG tablet Take 1 tablet (40 mg total) by mouth at bedtime. 90 tablet 1  . topiramate (TOPAMAX) 50 MG tablet Take 1 tablet (50 mg total) by mouth 2 (two) times daily. 180 tablet 0  . traMADol (ULTRAM) 50 MG tablet Take 1 tablet (50 mg total) by mouth every 6 (six) hours as needed. 100 tablet 2   Current Facility-Administered Medications on File Prior to Visit  Medication Dose Route Frequency Provider Last Rate Last Dose  . 0.9 %  sodium chloride infusion  500 mL Intravenous Continuous Armbruster, Carlota Raspberry, MD        Allergies  Allergen Reactions  . Ace Inhibitors     REACTION: Cough  . Caffeine-Sodium Benzoate     REACTION: PVCs  . Ciprofloxacin Itching  . Nsaids     Gastric Bypass Surgery - unable to take    Family History  Problem Relation Age of Onset  . Diabetes Mother   . Diabetes Father   . Liver cancer Maternal Aunt        2 aunt  . Colon cancer Maternal Uncle     BP 122/80 (BP Location: Left Arm, Patient Position: Sitting, Cuff Size: Normal)   Pulse 81   Wt 182 lb 6.4 oz (82.7 kg)   SpO2 98%   BMI 25.44 kg/m    Review of Systems Denies fever    Objective:   Physical Exam VITAL SIGNS:   See vs page GENERAL: no distress Left wrist is normal.  Pulses: dorsalis pedis intact bilat.   MSK: no deformity of the feet.  CV: 1+ left leg edema, and trace on the right.  Skin:  no ulcer on the feet.  normal color and temp on the feet. Neuro: sensation is  intact to touch on the feet.  Ext: no tend/erythema/warmth at the LUE and RLE.    Lab Results  Component Value Date   HGBA1C 6.5 12/27/2017       Assessment & Plan:  Wrist pain, new to me, uncertain etiology Type 2 DM: well-controlled   Patient Instructions  If you need a refill of the oxycodone, you would need a visit just for that purpose.   Please go back to see the specialist.  you will receive a phone call, about a day and time for an appointment.   Please continue the same medications for diabetes.   Please come back for a regular physical appointment in 3 months.

## 2017-12-27 NOTE — Patient Instructions (Addendum)
If you need a refill of the oxycodone, you would need a visit just for that purpose.   Please go back to see the specialist.  you will receive a phone call, about a day and time for an appointment.   Please continue the same medications for diabetes.   Please come back for a regular physical appointment in 3 months.

## 2018-01-03 ENCOUNTER — Ambulatory Visit: Payer: 59 | Admitting: Endocrinology

## 2018-01-10 ENCOUNTER — Ambulatory Visit (INDEPENDENT_AMBULATORY_CARE_PROVIDER_SITE_OTHER): Payer: 59

## 2018-01-10 ENCOUNTER — Ambulatory Visit: Payer: 59 | Admitting: Sports Medicine

## 2018-01-10 ENCOUNTER — Encounter: Payer: Self-pay | Admitting: Sports Medicine

## 2018-01-10 VITALS — BP 122/72 | HR 85 | Ht 71.0 in | Wt 182.4 lb

## 2018-01-10 DIAGNOSIS — R601 Generalized edema: Secondary | ICD-10-CM | POA: Diagnosis not present

## 2018-01-10 DIAGNOSIS — Z794 Long term (current) use of insulin: Secondary | ICD-10-CM | POA: Diagnosis not present

## 2018-01-10 DIAGNOSIS — M2550 Pain in unspecified joint: Secondary | ICD-10-CM

## 2018-01-10 DIAGNOSIS — K200 Eosinophilic esophagitis: Secondary | ICD-10-CM

## 2018-01-10 DIAGNOSIS — M25571 Pain in right ankle and joints of right foot: Secondary | ICD-10-CM

## 2018-01-10 DIAGNOSIS — M25572 Pain in left ankle and joints of left foot: Secondary | ICD-10-CM

## 2018-01-10 DIAGNOSIS — E1151 Type 2 diabetes mellitus with diabetic peripheral angiopathy without gangrene: Secondary | ICD-10-CM

## 2018-01-10 NOTE — Procedures (Signed)
X-Rays obtained at [facility] at [facility] Interpreted by myself Gerda Diss, DO) during office visit.  Results were reviewed with the patient at the time of the visit.   2 view right ankle and 2 view right foot  FINDINGS:   Diffuse demineralization of the foot and ankle    She has significant osteophytic erosions of the IP joint of the great toe   additionally prior transverse fracture of the proximal phalanx of the second toe looks to be well-healed   there are erosive changes of all of the IP joints.  Midfoot arthrosis is mild.    Significant heel spur   mild midfoot degenerative spurring.    She does have erosions of the ankle joint itself and mild subchondral cysts.    There is no acute fracture or dislocation.   Medial ankle joint with osteophytic spurring.   Significant loss of joint space of the tibiotalar joint. IMPRESSION:  1. Moderate degenerative arthritis of the ankle joint as well as midfoot with significant IP joint erosions.  Concerns for potential inflammatory versus crystal arthropathy.

## 2018-01-10 NOTE — Progress Notes (Signed)
Savannah [name]. Savannah [name], [place] at [facility] [phone]  Savannah [name] - 62 y.o. female MRN [id number]  Date of birth: [birth year]  Visit Date: 01/10/2018  PCP: Renato Shin, MD   Referred by: Renato Shin, MD   Scribe for today's visit: Josepha Pigg, CMA     SUBJECTIVE:  Savannah [name] is here for L hand pain, R foot pain  11/07/17: (Dr. Raeford Razor) Savannah [name] is a 62 y.o. female that is  Presenting with left dorsal wrist/hand pain. Pain has been present for five days. Pain is located over the base of the 5th metacarpal and ulnar sided dorsal carpal bones of the  left hand. She did go to the Urgent Care in [place] in September with similar pain, she received a steroid injection which improved the pain. Admits to swelling. She has been wearing a brace to help keep her hand stable. She is having intense pain. The pain is localized. She was told she had a gout flare. No significant history of prior flares. Pain is worst when it is touched and when she moves her wrist/hand. No injury. Having some redness of her wrist/hand but no fever or chills.   01/10/18: Compared to the last office visit, her previously described symptoms are worsening, pain comes and goes but has been very sore over the past week.  Current symptoms are mild & are described as stabbing at times She has received injection, Solu-Medrol, in the pain. She does have a wrist brace that she wears during flare-ups. 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 She is R hand dominant. She does notice occasional swelling in the wrist. Tramadol does not provide any relief. She also has Hydrocodone to take prn. She has noticed that at times her R hand will go very cold and last for about 1 hour.   Xray L hand 11/07/17.   R ankle pain is medial and radiates into the great toe. She does noticed swelling and erythema in the R ankle. She has hx of cancer and gets lymphedema. She has tried wearing a  brace for the ankle with minimal relief. She has tried topical Voltaren and Pennsaid. She cannot take NSAID d/t hx of bariatric surgery. Pt also diabetic and has neuropathy in her feet.   She has been tested for gout and RA in the past and per pt both were negative. Dr. Raeford Razor told her that he did notice some crystals on u/s when checking her hand/wrist.   No recent xray of ankle.      ROS Reports night time disturbances. Denies fevers, chills, or night sweats. Reports unexplained weight loss, 25 lbs weight loss over 9 months, thought to be related to [place]. Reports personal history of cancer, uterine. Denies changes in bowel or bladder habits, frequently changing d/t hx of bariatric surgery. . Denies recent unreported falls. Denies new or worsening dyspnea or wheezing. Reports dizziness.  Reports numbness, tingling or weakness in the extremities, mostly feet (neuropathy).  Reports lower extremity edema    HISTORY & PERTINENT PRIOR DATA:  Prior History reviewed and updated per electronic medical record.  Significant history, findings, studies and interim changes include:  reports that she has quit smoking. Her smoking use included cigarettes. She has a 22.50 pack-year smoking history. she has never used smokeless tobacco. Recent Labs    06/07/17 1115 08/16/17 1641 09/06/17 1401 12/27/17 1106  HGBA1C 6.3  --  7.0 6.5  LABURIC  --  4.7  --   --    Lab Results  Component Value Date   ESRSEDRATE 32 (H) 11/07/2017     No specialty comments available. No problems updated.  OBJECTIVE:  VS:  HT:5\' 11"  (180.3 cm)   WT:182 lb 6.4 oz (82.7 kg)  BMI:25.45    BP:122/72  HR:85bpm  TEMP: ( )  RESP:97 %   PHYSICAL EXAM: Constitutional: WDWN, Non-toxic appearing. Psychiatric: Alert & appropriately interactive.  Not depressed or anxious appearing. Respiratory: No increased work of breathing.  Trachea Midline Eyes: Pupils are equal.  EOM intact without nystagmus.  No scleral  icterus  NEUROVASCULAR exam: No clubbing or cyanosis appreciated No significant venous stasis changes Capillary Refill: normal, less than 2 seconds   Patient has had multiple joints throughout her hands as well as bilateral feet that do show significant degenerative bossing and slight angulations.  Her ankle has a moderate degree of swelling and has pain over the anterior medial and anterior lateral joint line with positive ballottement.  She has pain with ankle drawer testing but ankle is stable.  Inversion, eversion plantarflexion and dorsiflexion strength is 5 out of 5 but painful in all planes.  She does have a small amount of pain with palpation of the plantar fascia and Achilles but this is minimal and not the area of her greatest concern.  Small amount of pain across the dorsum of the midfoot but this is mild.  Good flexion and extension of the toes.  DP and PT pulses are palpable. 1+ pitting edema on the right, 2+ on the left.  ASSESSMENT & PLAN:   1. Type 2 diabetes mellitus with diabetic peripheral angiopathy without gangrene, with long-term current use of insulin ([place])   2. Arthralgia of both ankles   3. Eosinophilic esophagitis   4. Generalized edema   5. Polyarthralgia    ++++++++++++++++++++++++++++++++++++++++++++ Orders & Meds:  Orders Placed This Encounter  Procedures  . XR Ankle 2 Views Right  . XR Foot 2 Views Right  . Sedimentation rate  . C-reactive protein  . Uric acid  . Ambulatory referral to Rheumatology   No orders of the defined types were placed in this encounter.   ++++++++++++++++++++++++++++++++++++++++++++ PLAN:   Findings:  Quite a complicated case.  I am concerned for potential underlying inflammatory arthropathy versus crystal arthropathy.  Dr. Raeford Razor reportedly noted a starry night appearance of the fluid which may be suggestive of crystal arthropathy.  Given her uric acid level was checked when she is in acute flare and she is overall feeling  fairly good today I would like to recheck her uric acid levels as well as inflammatory markers.  Her sed rate has previously been elevated at 32 (elevated but likely age appropriate) but CRP, ANA, anti-CCP, rheumatoid factor have all been normal and reassuring.  Given how debilitating this has become for her even in the setting of these labs being normal I would like for her to be evaluated by rheumatology for her polyarthralgia complaints and a referral has been placed today.  Did discuss on happy to inject her ankle or any joint that acutely flares up in the interim and she will call us if she has any significant worsening.   No problem-specific Assessment & Plan notes found for this encounter.   Follow-up: Return if symptoms worsen or fail to improve.   Pertinent documentation may be included in additional procedure notes, imaging studies, problem based documentation and patient instructions. Please see these sections of the  encounter for additional information regarding this visit. CMA/ATC served as Education administrator during this visit. History, Physical, and Plan performed by medical provider. Documentation and orders reviewed and attested to.      Gerda Diss, [place] [facility] Physician

## 2018-01-11 LAB — SEDIMENTATION RATE: Sed Rate: 22 mm/h (ref 0–30)

## 2018-01-11 LAB — C-REACTIVE PROTEIN: CRP: 0.6 mg/L (ref <8.0)

## 2018-01-11 LAB — URIC ACID: Uric Acid, Serum: 3.8 mg/dL (ref 2.5–7.0)

## 2018-01-17 ENCOUNTER — Encounter: Payer: Self-pay | Admitting: Sports Medicine

## 2018-02-19 ENCOUNTER — Other Ambulatory Visit: Payer: Self-pay | Admitting: Endocrinology

## 2018-02-28 DIAGNOSIS — N390 Urinary tract infection, site not specified: Secondary | ICD-10-CM | POA: Diagnosis not present

## 2018-03-07 NOTE — Progress Notes (Signed)
Office Visit Note  Patient: Savannah [name]             Date of Birth: 09/24/1956           MRN: [id number]             PCP: Renato Shin, MD Referring: Gerda Diss, DO Visit Date: 03/21/2018 Occupation: Consultant for [employer]    Subjective:  Pain in multiple joints   History of Present Illness: Savannah [name] is a 62 y.o. female seen in consultation per request of Dr. Paulla Fore.  According to patient she has had history of osteoarthritis in her hands for many years.  She also has lot of discomfort in her CMC joints PIP and DIP joints.  She has had arthritis in her feet for several years.  She states recently she started having episodes of increased swelling.  She recalls in September 2018 she developed swelling over her left wrist which was extremely painful and lasted for 3 to 4 days after that she went to urgent care where she was given intramuscular and oral steroids.  The swelling responded to steroids.  She had another episode in December 2018 in the same joint requiring similar treatment.  At the time her uric acid and rheumatoid factor was negative.  She has had 2 more episodes since then.  She has had x-rays of her joints.  She was recently seen by Dr. Paulla Fore who evaluated her and did some lab work and referred here for further evaluation for possible crystal induced or inflammatory arthritis.  She states she has had off-and-on swelling in her right ankle for many years.  She also describes discomfort in her right first PIP joint.  She has had knee joint discomfort for many years and had right total knee replacement.  She has been told that she has osteoarthritis in her left knee joint where she had meniscal tear repair in the past.  She has been seeing Dr. Alma Friendly for her shoulder joints.  She states that her right shoulder joint require surgery.  Her right total knee replacement is still continues to hurt despite having surgery.  Activities of Daily Living:  Patient reports  morning stiffness for 5  minutes.   Patient Reports nocturnal pain.  Difficulty dressing/grooming: Denies Difficulty climbing stairs: Reports Difficulty getting out of chair: Reports Difficulty using hands for taps, buttons, cutlery, and/or writing: Reports   Review of Systems  Constitutional: Positive for fatigue. Negative for night sweats, weight gain and weight loss.  HENT: Positive for mouth dryness. Negative for mouth sores, trouble swallowing (Hx of esophageal dilation), trouble swallowing (Hx of esophageal dilation) and nose dryness.   Eyes: Positive for dryness. Negative for pain, redness and visual disturbance.  Respiratory: Negative for cough, hemoptysis, shortness of breath and difficulty breathing.   Cardiovascular: Positive for swelling in legs/feet. Negative for chest pain, palpitations, hypertension and irregular heartbeat.  Gastrointestinal: Positive for diarrhea. Negative for blood in stool and constipation.  Endocrine: Negative for increased urination.  Genitourinary: Negative for painful urination and vaginal dryness.  Musculoskeletal: Positive for arthralgias, joint pain and morning stiffness. Negative for joint swelling, myalgias, muscle weakness, muscle tenderness and myalgias.  Skin: Positive for sensitivity to sunlight. Negative for color change, pallor, rash, hair loss, nodules/bumps, skin tightness and ulcers.  Allergic/Immunologic: Negative for susceptible to infections.  Neurological: Negative for dizziness, numbness, headaches, memory loss, night sweats and weakness.  Hematological: Negative for swollen glands.  Psychiatric/Behavioral: Negative for depressed mood  and sleep disturbance. The patient is not nervous/anxious.     PMFS History:  Patient Active Problem List   Diagnosis Date Noted  . Left hand pain 11/08/2017  . Memory change 06/07/2017  . Abnormal chest CT 04/10/2017  . Loss of weight 11/16/2016  . Hematuria 10/12/2016  . Rash and nonspecific  skin eruption 04/26/2016  . Wellness examination 10/14/2015  . Rectal bleeding 10/14/2015  . Pain in joint, shoulder region 10/14/2015  . Sleep disorder 10/14/2015  . Contusion of toe of left foot 06/25/2014  . Multiple skin nodules 06/25/2014  . Menopausal state 06/25/2014  . Nose abnormality 01/30/2013  . Myalgia and myositis 01/30/2013  . Encounter for long-term (current) use of other medications 01/04/2012  . Uterine cancer ([place]) 01/04/2012  . POSTSURGICAL HYPOTHYROIDISM 09/19/2010  . BARIATRIC SURGERY STATUS 07/07/2010  . INGROWN TOENAIL 04/07/2010  . THYROID NODULE 03/09/2010  . FEVER, HX OF 03/09/2010  . HEADACHE 02/06/2010  . PPD positive 02/06/2010  . FEVER UNSPECIFIED 10/20/2009  . ABDOMINAL PAIN, CHRONIC 10/20/2009  . PYURIA 10/20/2009  . ACUTE CYSTITIS 08/17/2009  . EDEMA 05/20/2009  . WEIGHT GAIN 05/20/2009  . DYSPNEA 05/20/2009  . LEUKOPENIA, MILD 09/29/2008  . ASYMPTOMATIC POSTMENOPAUSAL STATUS 09/29/2008  . EOSINOPHILIC ESOPHAGITIS 38/08/1750  . OBESITY 01/06/2008  . NASH (nonalcoholic steatohepatitis) 01/06/2008  . OSTEOARTHRITIS 01/06/2008  . HEPATOMEGALY 01/06/2008  . Depression 11/21/2007  . PERIPHERAL NEUROPATHY 11/21/2007  . SINUSITIS- ACUTE-NOS 11/21/2007  . ALLERGIC RHINITIS 11/21/2007  . Diabetes ([place]) 07/08/2007  . HYPERLIPIDEMIA 07/08/2007  . Essential hypertension 07/08/2007    Past Medical History:  Diagnosis Date  . ABDOMINAL PAIN, CHRONIC 10/20/2009  . Acute cystitis 08/17/2009  . ALLERGIC RHINITIS 11/21/2007  . Allergy   . ASYMPTOMATIC POSTMENOPAUSAL STATUS 09/29/2008  . Bariatric surgery status 07/07/2010  . Cancer ([place])    uterine  . Cataract   . DEPRESSION 11/21/2007  . DIABETES MELLITUS, TYPE II 07/08/2007  . DYSPNEA 05/20/2009  . Edema 05/20/2009  . Eosinophilic esophagitis 0/12/5850  . FEVER UNSPECIFIED 10/20/2009  . FEVER, HX OF 03/09/2010  . GERD (gastroesophageal reflux disease)   . Headache(784.0) 02/06/2010  . Hepatomegaly  01/06/2008  . HYPERLIPIDEMIA 07/08/2007  . HYPERTENSION 07/08/2007  . LEUKOPENIA, MILD 09/29/2008  . OBESITY 01/06/2008  . OSTEOARTHRITIS 01/06/2008  . Other chronic nonalcoholic liver disease 7/78/2423  . PERIPHERAL NEUROPATHY 11/21/2007  . Peripheral neuropathy   . Postsurgical hypothyroidism 09/19/2010  . SINUSITIS- ACUTE-NOS 11/21/2007  . TB SKIN TEST, POSITIVE 02/06/2010  . THYROID NODULE 03/09/2010    Family History  Problem Relation Age of Onset  . Multiple sclerosis Sister   . Cancer Sister        breast   . Diabetes Mother   . Diabetes Father   . Liver cancer Maternal Aunt        2 aunt  . Colon cancer Maternal Uncle    Past Surgical History:  Procedure Laterality Date  . ABDOMINAL HYSTERECTOMY    . BASAL CELL CARCINOMA EXCISION    . COLONOSCOPY    . KNEE ARTHROSCOPY Left   . OOPHORECTOMY    . Right total knee replacement    . ROUX-EN-Y GASTRIC BYPASS  2011  . SKIN TAG REMOVAL    . THYROIDECTOMY    . TONSILLECTOMY AND ADENOIDECTOMY    . UPPER GASTROINTESTINAL ENDOSCOPY     Social History   Social History Narrative  . Not on file     Objective: Vital Signs: BP 109/69 (BP Location: Left  Arm, Patient Position: Sitting, Cuff Size: Normal)   Pulse 80   Resp 16   Ht 5' 11" (1.803 m)   Wt 180 lb (81.6 kg)   BMI 25.10 kg/m    Physical Exam  Constitutional: She is oriented to person, place, and time. She appears well-developed and well-nourished.  HENT:  Head: Normocephalic and atraumatic.  Eyes: Conjunctivae and EOM are normal.  Neck: Normal range of motion.  Cardiovascular: Normal rate, regular rhythm, normal heart sounds and intact distal pulses.  Pulmonary/Chest: Effort normal and breath sounds normal.  Abdominal: Soft. Bowel sounds are normal.  Lymphadenopathy:    She has no cervical adenopathy.  Neurological: She is alert and oriented to person, place, and time.  Skin: Skin is warm and dry. Capillary refill takes less than 2 seconds.  Psychiatric: She has  a normal mood and affect. Her behavior is normal.  Nursing note and vitals reviewed.   Musculoskeletal Exam: C-spine good range of motion.  She has thoracic kyphosis.  She has some discomfort range of motion of her lumbar spine.  Shoulder joints had some discomfort range of motion elbows and wrist joints were in good range of motion.  She has CMC PIP and DIP thickening bilaterally consistent with osteoarthritis no synovitis was noted.  Hip joints were in good range of motion.  Her right knee joint is replaced.  Left knee joint has some crepitus without any warmth swelling or effusion.  She had bilateral pedal edema.  No  tenderness or ankle joints was noted.  She has some DIP PIP thickening in her feet consistent with osteoarthritis.  CDAI Exam: No CDAI exam completed.    Investigation: No additional findings. Component     Latest Ref Rng & Units 01/10/2018  Sed Rate     0 - 30 mm/h 22  CRP     <8.0 mg/L 0.6  Uric Acid, Serum     2.5 - 7.0 mg/dL 3.8   Component     Latest Ref Rng & Units 08/16/2017  RA Latex Turbid.     <14 IU/mL <14  Anit Nuclear Antibody(ANA)     NEGATIVE NEGATIVE   Component     Latest Ref Rng & Units 37/08/6268  Cyclic Citrullin Peptide Ab     UNITS <16   CBC Latest Ref Rng & Units 06/07/2017 04/26/2016 10/14/2015  WBC 4.0 - 10.5 K/uL 3.5(L) 3.0(L) 3.7(L)  Hemoglobin 12.0 - 15.0 g/dL 12.6 12.1 12.6  Hematocrit 36.0 - 46.0 % 39.1 35.7(L) 38.0  Platelets 150.0 - 400.0 K/uL 144.0(L) 142.0(L) 161.0   CMP Latest Ref Rng & Units 06/07/2017 01/11/2017 04/13/2016  Glucose 70 - 99 mg/dL 165(H) - 117(H)  BUN 6 - 23 mg/dL 18 - 19  Creatinine 0.40 - 1.20 mg/dL 0.89 - 0.97  Sodium 135 - 145 mEq/L 141 - 142  Potassium 3.5 - 5.1 mEq/L 3.8 - 4.0  Chloride 96 - 112 mEq/L 109 - 109  CO2 19 - 32 mEq/L 26 - 25  Calcium 8.4 - 10.5 mg/dL 8.8 - 9.2  Total Protein 6.0 - 8.3 g/dL - 6.4 6.4  Total Bilirubin 0.2 - 1.2 mg/dL - 0.5 0.4  Alkaline Phos 39 - 117 U/L - 79 91  AST 0 - 37  U/L - 20 22  ALT 0 - 35 U/L - 25 28    Imaging: Xr Foot 2 Views Left  Result Date: 03/21/2018 Large calcaneal spur was noted.  No intertarsal or tibiotalar joint space narrowing was noted.  First MTP, all PIP and DIP narrowing was noted.  No erosive changes were noted. Impression: These findings are consistent with osteoarthritis of the foot.  Xr Hand 2 View Right  Result Date: 03/21/2018 PIP and DIP narrowing was noted.  With severe narrowing in the first DIP joint and cystic changes in the second and fifth DIP joints.  CMC narrowing was noted.  No radiocarpal or intercarpal joint space narrowing was noted. Impression: These findings are consistent with osteoarthritis of the hand.  Xr Knee 3 View Left  Result Date: 03/21/2018 Mild to moderate medial compartment narrowing was noted.  Lateral osteophytes were noted.  Intercondylar osteophytes were noted.  Moderate patellofemoral narrowing was noted.  No chondrocalcinosis was noted. Impression: These findings are consistent with moderate osteoarthritis and moderate chondromalacia patella.   Speciality Comments: No specialty comments available.    Procedures:  No procedures performed Allergies: Ace inhibitors; Caffeine-sodium benzoate; Ciprofloxacin; and Nsaids   Assessment / Plan:     Visit Diagnoses: Polyarthralgia - RF-, CCP-, uric acid 3.8, ANA -, sed rate WNL -she gives history of polyarthralgias for several years.  She states recently she has been having episodic swelling in her left wrist.  She has been also having swelling in her right ankle joint.  She had evaluation by Dr. Paulla Fore and there was possibility of crystal induced arthropathy.  I will obtain following labs today.  Plan: Magnesium, 14-3-3 eta Protein, Angiotensin converting enzyme, HLA-B27 antigen, Iron, TIBC and Ferritin Panel.  We discussed possible use of colchicine for right now.  I will place her on colchicine 0.6 mg p.o. daily and increase it to as needed twice daily  in case she has a flare.  I reviewed her all previous x-rays done by Dr. Paulla Fore.  Primary osteoarthritis of both hands -the clinical findings are consistent with osteoarthritis of her hand.  Plan: XR Hand 2 View Right.  The x-ray was consistent with osteoarthritis of the hand.  Primary osteoarthritis of left knee - Plan: XR KNEE 3 VIEW LEFT.  The x-ray showed moderate osteoarthritis and moderate chondromalacia patella.  Status post total knee replacement, right she continues to have chronic discomfort in her right knee.-  Primary osteoarthritis of both feet - Plan: XR Foot 2 Views Left.  The x-ray was consistent with osteoarthritis of the foot.  History of gastroesophageal reflux (GERD)  Eosinophilic esophagitis  Positive PPD - +ppd known since age 86-untreated. 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 considering INH prophylaxis  NASH (nonalcoholic steatohepatitis)  Status post gastric bypass for obesity  History of hyperlipidemia  Postsurgical hypothyroidism  History of uterine cancer  History of diabetes mellitus, type II  History of peripheral neuropathy  Other fatigue - Plan: CBC with Differential/Platelet, COMPLETE METABOLIC PANEL WITH GFR, TSH, Serum protein electrophoresis with reflex    Orders: Orders Placed This Encounter  Procedures  . XR Hand 2 View Right  . XR KNEE 3 VIEW LEFT  . XR Foot 2 Views Left  . CBC with Differential/Platelet  . COMPLETE METABOLIC PANEL WITH GFR  . TSH  . Magnesium  . 14-3-3 eta Protein  . Angiotensin converting enzyme  . HLA-B27 antigen  . Iron, TIBC and Ferritin Panel  . Serum protein electrophoresis with reflex   Meds ordered this encounter  Medications  . colchicine 0.6 MG tablet    Sig: Take 1 tablet (0.6 mg total) by mouth 2 (two) times daily.    Dispense:  30 tablet  Refill:  2    Face-to-face time spent with patient was 60 minutes. >50% of time was spent in  counseling and coordination of care.  Follow-Up Instructions: Return for Polyarthralgia, osteoarthritis.   Bo Merino, MD  Note - This record has been created using Editor, commissioning.  Chart creation errors have been sought, but may not always  have been located. Such creation errors do not reflect on  the standard of medical care.

## 2018-03-12 ENCOUNTER — Other Ambulatory Visit: Payer: Self-pay | Admitting: Endocrinology

## 2018-03-21 ENCOUNTER — Ambulatory Visit: Payer: 59 | Admitting: Rheumatology

## 2018-03-21 ENCOUNTER — Ambulatory Visit (INDEPENDENT_AMBULATORY_CARE_PROVIDER_SITE_OTHER): Payer: Self-pay

## 2018-03-21 ENCOUNTER — Encounter: Payer: Self-pay | Admitting: Rheumatology

## 2018-03-21 ENCOUNTER — Telehealth: Payer: Self-pay

## 2018-03-21 ENCOUNTER — Telehealth: Payer: Self-pay | Admitting: Rheumatology

## 2018-03-21 VITALS — BP 109/69 | HR 80 | Resp 16 | Ht 71.0 in | Wt 180.0 lb

## 2018-03-21 DIAGNOSIS — M2550 Pain in unspecified joint: Secondary | ICD-10-CM | POA: Diagnosis not present

## 2018-03-21 DIAGNOSIS — R7611 Nonspecific reaction to tuberculin skin test without active tuberculosis: Secondary | ICD-10-CM | POA: Diagnosis not present

## 2018-03-21 DIAGNOSIS — M19071 Primary osteoarthritis, right ankle and foot: Secondary | ICD-10-CM | POA: Diagnosis not present

## 2018-03-21 DIAGNOSIS — R5383 Other fatigue: Secondary | ICD-10-CM | POA: Diagnosis not present

## 2018-03-21 DIAGNOSIS — Z8719 Personal history of other diseases of the digestive system: Secondary | ICD-10-CM

## 2018-03-21 DIAGNOSIS — Z9884 Bariatric surgery status: Secondary | ICD-10-CM

## 2018-03-21 DIAGNOSIS — E890 Postprocedural hypothyroidism: Secondary | ICD-10-CM

## 2018-03-21 DIAGNOSIS — K200 Eosinophilic esophagitis: Secondary | ICD-10-CM

## 2018-03-21 DIAGNOSIS — M1712 Unilateral primary osteoarthritis, left knee: Secondary | ICD-10-CM | POA: Diagnosis not present

## 2018-03-21 DIAGNOSIS — K7581 Nonalcoholic steatohepatitis (NASH): Secondary | ICD-10-CM | POA: Diagnosis not present

## 2018-03-21 DIAGNOSIS — Z8542 Personal history of malignant neoplasm of other parts of uterus: Secondary | ICD-10-CM

## 2018-03-21 DIAGNOSIS — Z8669 Personal history of other diseases of the nervous system and sense organs: Secondary | ICD-10-CM

## 2018-03-21 DIAGNOSIS — M19072 Primary osteoarthritis, left ankle and foot: Secondary | ICD-10-CM

## 2018-03-21 DIAGNOSIS — Z8639 Personal history of other endocrine, nutritional and metabolic disease: Secondary | ICD-10-CM

## 2018-03-21 DIAGNOSIS — Z96651 Presence of right artificial knee joint: Secondary | ICD-10-CM

## 2018-03-21 DIAGNOSIS — M19041 Primary osteoarthritis, right hand: Secondary | ICD-10-CM | POA: Diagnosis not present

## 2018-03-21 DIAGNOSIS — M19042 Primary osteoarthritis, left hand: Secondary | ICD-10-CM

## 2018-03-21 MED ORDER — COLCHICINE 0.6 MG PO TABS: 0.6000 mg | ORAL_TABLET | Freq: Two times a day (BID) | ORAL | 2 refills | Status: DC

## 2018-03-21 NOTE — Telephone Encounter (Signed)
Spoke with pt at office visit today. She states that her Rx for colchicine will require a prior authorization. After running a test claim I discovered that her plan prefers Mitigare capsules. She can use a co-pay card to cover the cost of the co-pay.   Called pt to update. Had to leave a message.   Can we change Rx to [place]? Please send in new Rx or call pharmacy to change. Thanks!   Gerrad Welker, [place], CPhT 10:20 AM

## 2018-03-21 NOTE — Telephone Encounter (Signed)
Called pharmacy to update and left message for patient to get coupon online.   Brinkley Peet, [place], CPhT 1:58 PM

## 2018-03-21 NOTE — Telephone Encounter (Signed)
Patient called stating she was returning your call.   °

## 2018-03-21 NOTE — Telephone Encounter (Signed)
Please call pharmacy to instruct them to dispense  Mitigare 0.6 mg capsules.

## 2018-03-25 LAB — COMPLETE METABOLIC PANEL WITH GFR
AG Ratio: 2.2 (calc) (ref 1.0–2.5)
ALT: 23 U/L (ref 6–29)
AST: 19 U/L (ref 10–35)
Albumin: 4.3 g/dL (ref 3.6–5.1)
Alkaline phosphatase (APISO): 98 U/L (ref 33–130)
BUN: 19 mg/dL (ref 7–25)
CO2: 25 mmol/L (ref 20–32)
Calcium: 9.3 mg/dL (ref 8.6–10.4)
Chloride: 108 mmol/L (ref 98–110)
Creat: 0.96 mg/dL (ref 0.50–0.99)
GFR, Est African American: 73 mL/min/{1.73_m2} (ref >=60)
GFR, Est Non African American: 63 mL/min/{1.73_m2} (ref >=60)
Globulin: 2 g/dL (calc) (ref 1.9–3.7)
Glucose, Bld: 103 mg/dL — ABNORMAL HIGH (ref 65–99)
Potassium: 3.8 mmol/L (ref 3.5–5.3)
Sodium: 141 mmol/L (ref 135–146)
Total Bilirubin: 0.4 mg/dL (ref 0.2–1.2)
Total Protein: 6.3 g/dL (ref 6.1–8.1)

## 2018-03-25 LAB — CBC WITH DIFFERENTIAL/PLATELET
Basophils Absolute: 32 cells/uL (ref 0–200)
Basophils Relative: 0.8 %
Eosinophils Absolute: 152 cells/uL (ref 15–500)
Eosinophils Relative: 3.8 %
HCT: 39.2 % (ref 35.0–45.0)
Hemoglobin: 13.4 g/dL (ref 11.7–15.5)
Lymphs Abs: 1380 cells/uL (ref 850–3900)
MCH: 32.1 pg (ref 27.0–33.0)
MCHC: 34.2 g/dL (ref 32.0–36.0)
MCV: 93.8 fL (ref 80.0–100.0)
MPV: 11.5 fL (ref 7.5–12.5)
Monocytes Relative: 10.3 %
Neutro Abs: 2024 cells/uL (ref 1500–7800)
Neutrophils Relative %: 50.6 %
Platelets: 176 10*3/uL (ref 140–400)
RBC: 4.18 10*6/uL (ref 3.80–5.10)
RDW: 13.3 % (ref 11.0–15.0)
Total Lymphocyte: 34.5 %
WBC mixed population: 412 cells/uL (ref 200–950)
WBC: 4 10*3/uL (ref 3.8–10.8)

## 2018-03-25 LAB — HLA-B27 ANTIGEN: HLA-B27 Antigen: NEGATIVE

## 2018-03-25 LAB — IRON,TIBC AND FERRITIN PANEL
%SAT: 31 % (calc) (ref 11–50)
Ferritin: 15 ng/mL — ABNORMAL LOW (ref 20–288)
Iron: 118 ug/dL (ref 45–160)
TIBC: 377 mcg/dL (calc) (ref 250–450)

## 2018-03-25 LAB — ANGIOTENSIN CONVERTING ENZYME: Angiotensin-Converting Enzyme: 15 U/L (ref 9–67)

## 2018-03-25 LAB — PROTEIN ELECTROPHORESIS, SERUM, WITH REFLEX
Albumin ELP: 4.3 g/dL (ref 3.8–4.8)
Alpha 1: 0.2 g/dL (ref 0.2–0.3)
Alpha 2: 0.9 g/dL (ref 0.5–0.9)
Beta 2: 0.3 g/dL (ref 0.2–0.5)
Beta Globulin: 0.4 g/dL (ref 0.4–0.6)
Gamma Globulin: 0.5 g/dL — ABNORMAL LOW (ref 0.8–1.7)
Total Protein: 6.7 g/dL (ref 6.1–8.1)

## 2018-03-25 LAB — 14-3-3 ETA PROTEIN: 14-3-3 eta Protein: <0.2 ng/mL (ref <0.2)

## 2018-03-25 LAB — MAGNESIUM: Magnesium: 2.2 mg/dL (ref 1.5–2.5)

## 2018-03-25 LAB — TSH: TSH: 0.45 mIU/L (ref 0.40–4.50)

## 2018-03-25 LAB — IFE INTERPRETATION: Immunofix Electr Int: NOT DETECTED

## 2018-03-26 ENCOUNTER — Encounter: Payer: 59 | Admitting: Endocrinology

## 2018-04-02 ENCOUNTER — Other Ambulatory Visit: Payer: Self-pay | Admitting: Endocrinology

## 2018-04-11 ENCOUNTER — Other Ambulatory Visit: Payer: Self-pay | Admitting: Endocrinology

## 2018-04-11 DIAGNOSIS — M1712 Unilateral primary osteoarthritis, left knee: Secondary | ICD-10-CM | POA: Insufficient documentation

## 2018-04-11 DIAGNOSIS — M19071 Primary osteoarthritis, right ankle and foot: Secondary | ICD-10-CM | POA: Insufficient documentation

## 2018-04-11 DIAGNOSIS — M19072 Primary osteoarthritis, left ankle and foot: Secondary | ICD-10-CM | POA: Insufficient documentation

## 2018-04-11 NOTE — Progress Notes (Signed)
Office Visit Note  Patient: Savannah [name]             Date of Birth: [birth year]           MRN: [id number]             PCP: Renato Shin, MD Referring: Renato Shin, MD Visit Date: 04/18/2018 Occupation: _0 @    Subjective:  Pain in multiple joints.   History of Present Illness: Savannah [name] is a 62 y.o. female with history of osteoarthritis and intermittent inflammatory arthritis.  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  Patient states she has had one episode of flare in her left wrist joint which lasted only for 1 day.  She started colchicine a day before.  She has not had any major flares since then.  She continues to have discomfort in her right first PIP joint.  She has difficulty walking due to that.  She also has peripheral neuropathy and she is unable to use shoes with close toes.  Continues to have discomfort in her hands especially in her thumbs.  And discomfort in her knee joints.  The right total knee replacement is doing okay.  Activities of Daily Living:  Patient reports morning stiffness for 5 minutes.   Patient Reports nocturnal pain.  Difficulty dressing/grooming: Denies Difficulty climbing stairs: Reports Difficulty getting out of chair: Reports Difficulty using hands for taps, buttons, cutlery, and/or writing: Reports   Review of Systems  Constitutional: Positive for fatigue. Negative for night sweats, weight gain and weight loss.  HENT: Positive for mouth dryness. Negative for mouth sores, trouble swallowing, trouble swallowing and nose dryness.   Eyes: Negative for pain, redness, visual disturbance and dryness.  Respiratory: Negative for cough, shortness of breath and difficulty breathing.   Cardiovascular: Positive for palpitations. Negative for chest pain, hypertension, irregular heartbeat and swelling in legs/feet.       H/o PVCs  Gastrointestinal: Negative for blood in stool, constipation and diarrhea.    Endocrine: Negative for increased urination.  Genitourinary: Negative for vaginal dryness.  Musculoskeletal: Positive for arthralgias, joint pain and morning stiffness. Negative for joint swelling, myalgias, muscle weakness, muscle tenderness and myalgias.  Skin: Negative for color change, rash, hair loss, skin tightness, ulcers and sensitivity to sunlight.  Allergic/Immunologic: Negative for susceptible to infections.  Neurological: Negative for dizziness, memory loss, night sweats and weakness.  Hematological: Negative for swollen glands.  Psychiatric/Behavioral: Negative for depressed mood and sleep disturbance. The patient is not nervous/anxious.     PMFS History:  Patient Active Problem List   Diagnosis Date Noted  . Primary osteoarthritis of both feet 04/11/2018  . Primary osteoarthritis of left knee 04/11/2018  . Left hand pain 11/08/2017  . Memory change 06/07/2017  . Abnormal chest CT 04/10/2017  . Loss of weight 11/16/2016  . Hematuria 10/12/2016  . Rash and nonspecific skin eruption 04/26/2016  . Wellness examination 10/14/2015  . Rectal bleeding 10/14/2015  . Pain in joint, shoulder region 10/14/2015  . Sleep disorder 10/14/2015  . Contusion of toe of left foot 06/25/2014  . Multiple skin nodules 06/25/2014  . Menopausal state 06/25/2014  . Nose abnormality 01/30/2013  . Myalgia and myositis 01/30/2013  . Encounter for long-term (current) use of other medications 01/04/2012  . Uterine cancer ([place]) 01/04/2012  . POSTSURGICAL HYPOTHYROIDISM 09/19/2010  . BARIATRIC SURGERY STATUS 07/07/2010  . INGROWN TOENAIL 04/07/2010  . THYROID NODULE 03/09/2010  . FEVER, HX OF 03/09/2010  .  HEADACHE 02/06/2010  . PPD positive 02/06/2010  . FEVER UNSPECIFIED 10/20/2009  . ABDOMINAL PAIN, CHRONIC 10/20/2009  . PYURIA 10/20/2009  . ACUTE CYSTITIS 08/17/2009  . EDEMA 05/20/2009  . WEIGHT GAIN 05/20/2009  . DYSPNEA 05/20/2009  . LEUKOPENIA, MILD 09/29/2008  . ASYMPTOMATIC  POSTMENOPAUSAL STATUS 09/29/2008  . EOSINOPHILIC ESOPHAGITIS 54/07/8118  . OBESITY 01/06/2008  . NASH (nonalcoholic steatohepatitis) 01/06/2008  . Primary osteoarthritis of both hands 01/06/2008  . HEPATOMEGALY 01/06/2008  . Depression 11/21/2007  . PERIPHERAL NEUROPATHY 11/21/2007  . SINUSITIS- ACUTE-NOS 11/21/2007  . ALLERGIC RHINITIS 11/21/2007  . Diabetes ([place]) 07/08/2007  . HYPERLIPIDEMIA 07/08/2007  . Essential hypertension 07/08/2007    Past Medical History:  Diagnosis Date  . ABDOMINAL PAIN, CHRONIC 10/20/2009  . Acute cystitis 08/17/2009  . ALLERGIC RHINITIS 11/21/2007  . Allergy   . ASYMPTOMATIC POSTMENOPAUSAL STATUS 09/29/2008  . Bariatric surgery status 07/07/2010  . Cancer ([place])    uterine  . Cataract   . DEPRESSION 11/21/2007  . DIABETES MELLITUS, TYPE II 07/08/2007  . DYSPNEA 05/20/2009  . Edema 05/20/2009  . Eosinophilic esophagitis 11/16/7827  . FEVER UNSPECIFIED 10/20/2009  . FEVER, HX OF 03/09/2010  . GERD (gastroesophageal reflux disease)   . Headache(784.0) 02/06/2010  . Hepatomegaly 01/06/2008  . HYPERLIPIDEMIA 07/08/2007  . HYPERTENSION 07/08/2007  . LEUKOPENIA, MILD 09/29/2008  . OBESITY 01/06/2008  . OSTEOARTHRITIS 01/06/2008  . Other chronic nonalcoholic liver disease 5/62/1308  . PERIPHERAL NEUROPATHY 11/21/2007  . Peripheral neuropathy   . Postsurgical hypothyroidism 09/19/2010  . SINUSITIS- ACUTE-NOS 11/21/2007  . TB SKIN TEST, POSITIVE 02/06/2010  . THYROID NODULE 03/09/2010    Family History  Problem Relation Age of Onset  . Multiple sclerosis Sister   . Cancer Sister        breast   . Diabetes Mother   . Diabetes Father   . Liver cancer Maternal Aunt        2 aunt  . Colon cancer Maternal Uncle    Past Surgical History:  Procedure Laterality Date  . ABDOMINAL HYSTERECTOMY    . BASAL CELL CARCINOMA EXCISION    . COLONOSCOPY    . KNEE ARTHROSCOPY Left   . OOPHORECTOMY    . Right total knee replacement    . ROUX-EN-Y GASTRIC BYPASS  2011  . SKIN TAG  REMOVAL    . THYROIDECTOMY    . TONSILLECTOMY AND ADENOIDECTOMY    . UPPER GASTROINTESTINAL ENDOSCOPY     Social History   Social History Narrative  . Not on file     Objective: Vital Signs: BP 109/72 (BP Location: Left Arm, Patient Position: Sitting, Cuff Size: Normal)   Pulse 91   Resp 15   Ht _0  (1.803 m)   Wt 172 lb (78 kg)   BMI 23.99 kg/m    Physical Exam  Constitutional: She is oriented to person, place, and time. She appears well-developed and well-nourished.  HENT:  Head: Normocephalic and atraumatic.  Eyes: Conjunctivae and EOM are normal.  Neck: Normal range of motion.  Cardiovascular: Normal rate, regular rhythm, normal heart sounds and intact distal pulses.  Pulmonary/Chest: Effort normal and breath sounds normal.  Abdominal: Soft. Bowel sounds are normal.  Lymphadenopathy:    She has no cervical adenopathy.  Neurological: She is alert and oriented to person, place, and time.  Skin: Skin is warm and dry. Capillary refill takes less than 2 seconds.  Psychiatric: She has a normal mood and affect. Her behavior is normal.  Nursing note and  vitals reviewed.    Musculoskeletal Exam: C-spine limited range of motion.  Thoracic and lumbar spine good range of motion.  Shoulder joints elbow joints wrist joints with good range of motion.  She has DIP PIP and CMC thickening in her hands consistent with osteoarthritis.  Her right total knee replacement continues to hurt.  She has some crepitus in her left knee joint.  She has osteoarthritic changes in her feet with DIP and PIP thickening.  No synovitis was noted.  CDAI Exam: No CDAI exam completed.    Investigation: No additional findings. Mar 21, 2018 CBC normal, CMP normal, IFE negative, iron studies normal, ferritin low at 15, TSH normal, magnesium normal, _0 at a negative, ACE negative, HLA-B27 negative  Imaging: Xr Foot 2 Views Left  Result Date: 03/21/2018 Large calcaneal spur was noted.  No intertarsal  or tibiotalar joint space narrowing was noted.  First MTP, all PIP and DIP narrowing was noted.  No erosive changes were noted. Impression: These findings are consistent with osteoarthritis of the foot.  Xr Hand 2 View Right  Result Date: 03/21/2018 PIP and DIP narrowing was noted.  With severe narrowing in the first DIP joint and cystic changes in the second and fifth DIP joints.  CMC narrowing was noted.  No radiocarpal or intercarpal joint space narrowing was noted. Impression: These findings are consistent with osteoarthritis of the hand.  Xr Knee 3 View Left  Result Date: 03/21/2018 Mild to moderate medial compartment narrowing was noted.  Lateral osteophytes were noted.  Intercondylar osteophytes were noted.  Moderate patellofemoral narrowing was noted.  No chondrocalcinosis was noted. Impression: These findings are consistent with moderate osteoarthritis and moderate chondromalacia patella.   Speciality Comments: No specialty comments available.    Procedures:  No procedures performed Allergies: Ace inhibitors; Caffeine-sodium benzoate; Ciprofloxacin; and Nsaids   Assessment / Plan:     Visit Diagnoses: Primary osteoarthritis of both hands-she has osteoarthritic changes in her hands which causes discomfort.  Joint protection muscle strengthening was discussed.  A handout on hand exercises was given.  A list of natural anti-inflammatories was given.  And side effects were reviewed.  Primary osteoarthritis of left knee - Moderate with moderate chondromalacia patella.  She will benefit from muscle strengthening exercises.  Handout on knee exercises was given.  She has some generalized deconditioning.  I have encouraged her to exercise on a regular basis.  Water aerobics will be helpful.  Primary osteoarthritis of both feet-she is osteoarthritis in her feet which causes chronic discomfort.  Have advised her to use shoes with the good arch support and also has strap behind her heel.  She  may consider seeing a podiatrist.  Status post total right knee replacement-she still has chronic pain.  Inflammatory arthritis - all autoimmune work up negative. Intermittent swelling. Uric acid was normal.  She possibly has CPPD as the inflammatory arthritis last only for a few days and then resolves.  She had good response to colchicine when she had a recent flare which lasted only for 1 day.  I have given her a new prescription for colchicine with a coupon hopefully she will be able to get it now.  Side effects of colchicine were discussed at length.  I have advised her not to take colchicine with fluconazole.  We also discussed the increased risk of colchicine and statin combination and risk of myopathy.  Other medical problems are listed as follows:  History of gastroesophageal reflux (GERD)  Eosinophilic esophagitis  Positive PPD - +ppd known since age 47-untreated. 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 considering INH prophylaxis  NASH (nonalcoholic steatohepatitis)  Status post gastric bypass for obesity  History of hyperlipidemia  Postsurgical hypothyroidism  History of uterine cancer  History of diabetes mellitus, type II  History of peripheral neuropathy    Orders: No orders of the defined types were placed in this encounter.  Meds ordered this encounter  Medications  . Colchicine (MITIGARE) 0.6 MG CAPS    Sig: Take 1 capsule by mouth two times daily.    Dispense:  60 capsule    Refill:  2    Face-to-face time spent with patient was 30 minutes. >50% of time was spent in counseling and coordination of care.  Follow-Up Instructions: Return in about 6 months (around 10/18/2018) for Osteoarthritis, inflammatory arthritis, CPPD.   Bo Merino, MD  Note - This record has been created using Editor, commissioning.  Chart creation errors have been sought, but may not always  have been located. Such  creation errors do not reflect on  the standard of medical care.

## 2018-04-18 ENCOUNTER — Encounter: Payer: Self-pay | Admitting: Rheumatology

## 2018-04-18 ENCOUNTER — Ambulatory Visit: Payer: 59 | Admitting: Rheumatology

## 2018-04-18 VITALS — BP 109/72 | HR 91 | Resp 15 | Ht 71.0 in | Wt 172.0 lb

## 2018-04-18 DIAGNOSIS — M19071 Primary osteoarthritis, right ankle and foot: Secondary | ICD-10-CM | POA: Diagnosis not present

## 2018-04-18 DIAGNOSIS — M19042 Primary osteoarthritis, left hand: Secondary | ICD-10-CM

## 2018-04-18 DIAGNOSIS — M19072 Primary osteoarthritis, left ankle and foot: Secondary | ICD-10-CM | POA: Diagnosis not present

## 2018-04-18 DIAGNOSIS — Z96651 Presence of right artificial knee joint: Secondary | ICD-10-CM

## 2018-04-18 DIAGNOSIS — M1990 Unspecified osteoarthritis, unspecified site: Secondary | ICD-10-CM | POA: Diagnosis not present

## 2018-04-18 DIAGNOSIS — M1712 Unilateral primary osteoarthritis, left knee: Secondary | ICD-10-CM | POA: Diagnosis not present

## 2018-04-18 DIAGNOSIS — M19041 Primary osteoarthritis, right hand: Secondary | ICD-10-CM

## 2018-04-18 MED ORDER — COLCHICINE 0.6 MG PO CAPS: ORAL_CAPSULE | ORAL | 2 refills | Status: DC

## 2018-04-18 NOTE — Patient Instructions (Signed)
Natural anti-inflammatories  You can purchase these at [employer], [employer] or online.  . Turmeric (capsules)  . Ginger (ginger root or capsules)  . Omega 3 (Fish, flax seeds, chia seeds, walnuts, almonds)  . Tart cherry (dried or extract)   Patient should be under the care of a physician while taking these supplements. This may not be reproduced without the permission of Dr. Bo Merino.      Hand Exercises Hand exercises can be helpful to almost anyone. These exercises can strengthen the hands, improve flexibility and movement, and increase blood flow to the hands. These results can make work and daily tasks easier. Hand exercises can be especially helpful for people who have joint pain from arthritis or have nerve damage from overuse (carpal tunnel syndrome). These exercises can also help people who have injured a hand. Most of these hand exercises are fairly gentle stretching routines. You can do them often throughout the day. Still, it is a good idea to ask your health care provider which exercises would be best for you. Warming your hands before exercise may help to reduce stiffness. You can do this with gentle massage or by placing your hands in warm water for 15 minutes. Also, make sure you pay attention to your level of hand pain as you begin an exercise routine. Exercises Knuckle Bend Repeat this exercise 5-10 times with each hand. 1. Stand or sit with your arm, hand, and all five fingers pointed straight up. Make sure your wrist is straight. 2. Gently and slowly bend your fingers down and inward until the tips of your fingers are touching the tops of your palm. 3. Hold this position for a few seconds. 4. Extend your fingers out to their original position, all pointing straight up again.  Finger Fan Repeat this exercise 5-10 times with each hand. 1. Hold your arm and hand out in front of you. Keep your wrist straight. 2. Squeeze your hand into a fist. 3. Hold  this position for a few seconds. 4. Edison Simon out, or spread apart, your hand and fingers as much as possible, stretching every joint fully.  Tabletop Repeat this exercise 5-10 times with each hand. 1. Stand or sit with your arm, hand, and all five fingers pointed straight up. Make sure your wrist is straight. 2. Gently and slowly bend your fingers at the knuckles where they meet the hand until your hand is making an upside-down L shape. Your fingers should form a tabletop. 3. Hold this position for a few seconds. 4. Extend your fingers out to their original position, all pointing straight up again.  Making Os Repeat this exercise 5-10 times with each hand. 1. Stand or sit with your arm, hand, and all five fingers pointed straight up. Make sure your wrist is straight. 2. Make an O shape by touching your pointer finger to your thumb. Hold for a few seconds. Then open your hand wide. 3. Repeat this motion with each finger on your hand.  Table Spread Repeat this exercise 5-10 times with each hand. 1. Place your hand on a table with your palm facing down. Make sure your wrist is straight. 2. Spread your fingers out as much as possible. Hold this position for a few seconds. 3. Slide your fingers back together again. Hold for a few seconds.  Ball Grip  Repeat this exercise 10-15 times with each hand. 1. Hold a tennis ball or another soft ball in your hand. 2. While slowly increasing pressure, squeeze the  ball as hard as possible. 3. Squeeze as hard as you can for 3-5 seconds. 4. Relax and repeat.  Wrist Curls Repeat this exercise 10-15 times with each hand. 1. Sit in a chair that has armrests. 2. Hold a light weight in your hand, such as a dumbbell that weighs 1-3 pounds (0.5-1.4 kg). Ask your health care provider what weight would be best for you. 3. Rest your hand just over the end of the chair arm with your palm facing up. 4. Gently pivot your wrist up and down while holding the weight. Do  not twist your wrist from side to side.  Contact a health care provider if:  Your hand pain or discomfort gets much worse when you do an exercise.  Your hand pain or discomfort does not improve within 2 hours after you exercise. If you have any of these problems, stop doing these exercises right away. Do not do them again unless your health care provider says that you can. Get help right away if:  You develop sudden, severe hand pain. If this happens, stop doing these exercises right away. Do not do them again unless your health care provider says that you can. This information is not intended to replace advice given to you by your health care provider. Make sure you discuss any questions you have with your health care provider. Document Released: 10/10/2015 Document Revised: 04/05/2016 Document Reviewed: 05/09/2015 Elsevier Interactive Patient Education  2018 [employer]. Knee Exercises Ask your health care provider which exercises are safe for you. Do exercises exactly as told by your health care provider and adjust them as directed. It is normal to feel mild stretching, pulling, tightness, or discomfort as you do these exercises, but you should stop right away if you feel sudden pain or your pain gets worse.Do not begin these exercises until told by your health care provider. STRETCHING AND RANGE OF MOTION EXERCISES These exercises warm up your muscles and joints and improve the movement and flexibility of your knee. These exercises also help to relieve pain, numbness, and tingling. Exercise A: Knee Extension, Prone 1. Lie on your abdomen on a bed. 2. Place your left / right knee just beyond the edge of the surface so your knee is not on the bed. You can put a towel under your left / right thigh just above your knee for comfort. 3. Relax your leg muscles and allow gravity to straighten your knee. You should feel a stretch behind your left / right knee. 4. Hold this position for __________  seconds. 5. Scoot up so your knee is supported between repetitions. Repeat __________ times. Complete this stretch __________ times a day. Exercise B: Knee Flexion, Active  1. Lie on your back with both knees straight. If this causes back discomfort, bend your left / right knee so your foot is flat on the floor. 2. Slowly slide your left / right heel back toward your buttocks until you feel a gentle stretch in the front of your knee or thigh. 3. Hold this position for __________ seconds. 4. Slowly slide your left / right heel back to the starting position. Repeat __________ times. Complete this exercise __________ times a day. Exercise C: Quadriceps, Prone  1. Lie on your abdomen on a firm surface, such as a bed or padded floor. 2. Bend your left / right knee and hold your ankle. If you cannot reach your ankle or pant leg, loop a belt around your foot and grab the belt instead.  3. Gently pull your heel toward your buttocks. Your knee should not slide out to the side. You should feel a stretch in the front of your thigh and knee. 4. Hold this position for __________ seconds. Repeat __________ times. Complete this stretch __________ times a day. Exercise D: Hamstring, Supine 1. Lie on your back. 2. Loop a belt or towel over the ball of your left / right foot. The ball of your foot is on the walking surface, right under your toes. 3. Straighten your left / right knee and slowly pull on the belt to raise your leg until you feel a gentle stretch behind your knee. ? Do not let your left / right knee bend while you do this. ? Keep your other leg flat on the floor. 4. Hold this position for __________ seconds. Repeat __________ times. Complete this stretch __________ times a day. STRENGTHENING EXERCISES These exercises build strength and endurance in your knee. Endurance is the ability to use your muscles for a long time, even after they get tired. Exercise E: Quadriceps, Isometric  1. Lie on  your back with your left / right leg extended and your other knee bent. Put a rolled towel or small pillow under your knee if told by your health care provider. 2. Slowly tense the muscles in the front of your left / right thigh. You should see your kneecap slide up toward your hip or see increased dimpling just above the knee. This motion will push the back of the knee toward the floor. 3. For __________ seconds, keep the muscle as tight as you can without increasing your pain. 4. Relax the muscles slowly and completely. Repeat __________ times. Complete this exercise __________ times a day. Exercise F: Straight Leg Raises - Quadriceps 1. Lie on your back with your left / right leg extended and your other knee bent. 2. Tense the muscles in the front of your left / right thigh. You should see your kneecap slide up or see increased dimpling just above the knee. Your thigh may even shake a bit. 3. Keep these muscles tight as you raise your leg 4-6 inches (10-15 cm) off the floor. Do not let your knee bend. 4. Hold this position for __________ seconds. 5. Keep these muscles tense as you lower your leg. 6. Relax your muscles slowly and completely after each repetition. Repeat __________ times. Complete this exercise __________ times a day. Exercise G: Hamstring, Isometric 1. Lie on your back on a firm surface. 2. Bend your left / right knee approximately __________ degrees. 3. Dig your left / right heel into the surface as if you are trying to pull it toward your buttocks. Tighten the muscles in the back of your thighs to dig as hard as you can without increasing any pain. 4. Hold this position for __________ seconds. 5. Release the tension gradually and allow your muscles to relax completely for __________ seconds after each repetition. Repeat __________ times. Complete this exercise __________ times a day. Exercise H: Hamstring Curls  If told by your health care provider, do this exercise while  wearing ankle weights. Begin with __________ weights. Then increase the weight by 1 lb (0.5 kg) increments. Do not wear ankle weights that are more than __________. 1. Lie on your abdomen with your legs straight. 2. Bend your left / right knee as far as you can without feeling pain. Keep your hips flat against the floor. 3. Hold this position for __________ seconds. 4. Slowly lower your  leg to the starting position.  Repeat __________ times. Complete this exercise __________ times a day. Exercise I: Squats (Quadriceps) 1. Stand in front of a table, with your feet and knees pointing straight ahead. You may rest your hands on the table for balance but not for support. 2. Slowly bend your knees and lower your hips like you are going to sit in a chair. ? Keep your weight over your heels, not over your toes. ? Keep your lower legs upright so they are parallel with the table legs. ? Do not let your hips go lower than your knees. ? Do not bend lower than told by your health care provider. ? If your knee pain increases, do not bend as low. 3. Hold the squat position for __________ seconds. 4. Slowly push with your legs to return to standing. Do not use your hands to pull yourself to standing. Repeat __________ times. Complete this exercise __________ times a day. Exercise J: Wall Slides (Quadriceps)  1. Lean your back against a smooth wall or door while you walk your feet out 18-24 inches (46-61 cm) from it. 2. Place your feet hip-width apart. 3. Slowly slide down the wall or door until your knees bend __________ degrees. Keep your knees over your heels, not over your toes. Keep your knees in line with your hips. 4. Hold for __________ seconds. Repeat __________ times. Complete this exercise __________ times a day. Exercise K: Straight Leg Raises - Hip Abductors 1. Lie on your side with your left / right leg in the top position. Lie so your head, shoulder, knee, and hip line up. You may bend your  bottom knee to help you keep your balance. 2. Roll your hips slightly forward so your hips are stacked directly over each other and your left / right knee is facing forward. 3. Leading with your heel, lift your top leg 4-6 inches (10-15 cm). You should feel the muscles in your outer hip lifting. ? Do not let your foot drift forward. ? Do not let your knee roll toward the ceiling. 4. Hold this position for __________ seconds. 5. Slowly return your leg to the starting position. 6. Let your muscles relax completely after each repetition. Repeat __________ times. Complete this exercise __________ times a day. Exercise L: Straight Leg Raises - Hip Extensors 1. Lie on your abdomen on a firm surface. You can put a pillow under your hips if that is more comfortable. 2. Tense the muscles in your buttocks and lift your left / right leg about 4-6 inches (10-15 cm). Keep your knee straight as you lift your leg. 3. Hold this position for __________ seconds. 4. Slowly lower your leg to the starting position. 5. Let your leg relax completely after each repetition. Repeat __________ times. Complete this exercise __________ times a day. This information is not intended to replace advice given to you by your health care provider. Make sure you discuss any questions you have with your health care provider. Document Released: 09/12/2005 Document Revised: 07/23/2016 Document Reviewed: 09/04/2015 Elsevier Interactive Patient Education  2018 [employer].

## 2018-04-25 ENCOUNTER — Other Ambulatory Visit: Payer: Self-pay | Admitting: Endocrinology

## 2018-05-10 ENCOUNTER — Other Ambulatory Visit: Payer: Self-pay | Admitting: Endocrinology

## 2018-05-14 ENCOUNTER — Other Ambulatory Visit: Payer: Self-pay | Admitting: Internal Medicine

## 2018-05-16 ENCOUNTER — Encounter: Payer: 59 | Admitting: Endocrinology

## 2018-05-27 ENCOUNTER — Other Ambulatory Visit: Payer: Self-pay | Admitting: Endocrinology

## 2018-06-12 ENCOUNTER — Other Ambulatory Visit: Payer: Self-pay | Admitting: Endocrinology

## 2018-06-23 ENCOUNTER — Other Ambulatory Visit: Payer: Self-pay | Admitting: Endocrinology

## 2018-07-04 ENCOUNTER — Encounter: Payer: Self-pay | Admitting: Endocrinology

## 2018-07-04 ENCOUNTER — Ambulatory Visit (INDEPENDENT_AMBULATORY_CARE_PROVIDER_SITE_OTHER): Payer: 59 | Admitting: Endocrinology

## 2018-07-04 VITALS — BP 122/80 | HR 72 | Ht 71.0 in | Wt 170.2 lb

## 2018-07-04 DIAGNOSIS — Z0000 Encounter for general adult medical examination without abnormal findings: Secondary | ICD-10-CM | POA: Diagnosis not present

## 2018-07-04 DIAGNOSIS — R079 Chest pain, unspecified: Secondary | ICD-10-CM

## 2018-07-04 DIAGNOSIS — R252 Cramp and spasm: Secondary | ICD-10-CM | POA: Diagnosis not present

## 2018-07-04 DIAGNOSIS — E119 Type 2 diabetes mellitus without complications: Secondary | ICD-10-CM | POA: Diagnosis not present

## 2018-07-04 DIAGNOSIS — Z01419 Encounter for gynecological examination (general) (routine) without abnormal findings: Secondary | ICD-10-CM | POA: Diagnosis not present

## 2018-07-04 DIAGNOSIS — Z794 Long term (current) use of insulin: Secondary | ICD-10-CM | POA: Diagnosis not present

## 2018-07-04 DIAGNOSIS — G629 Polyneuropathy, unspecified: Secondary | ICD-10-CM | POA: Diagnosis not present

## 2018-07-04 DIAGNOSIS — Z6823 Body mass index (BMI) 23.0-23.9, adult: Secondary | ICD-10-CM | POA: Diagnosis not present

## 2018-07-04 DIAGNOSIS — Z130 Encounter for screening for diseases of the blood and blood-forming organs and certain disorders involving the immune mechanism: Secondary | ICD-10-CM | POA: Diagnosis not present

## 2018-07-04 DIAGNOSIS — R319 Hematuria, unspecified: Secondary | ICD-10-CM | POA: Diagnosis not present

## 2018-07-04 LAB — CBC WITH DIFFERENTIAL/PLATELET
Basophils Absolute: 0 10*3/uL (ref 0.0–0.1)
Basophils Relative: 0.6 % (ref 0.0–3.0)
Eosinophils Absolute: 0.1 10*3/uL (ref 0.0–0.7)
Eosinophils Relative: 2.2 % (ref 0.0–5.0)
HCT: 42.1 % (ref 36.0–46.0)
Hemoglobin: 14.2 g/dL (ref 12.0–15.0)
Lymphocytes Relative: 31.6 % (ref 12.0–46.0)
Lymphs Abs: 1.3 10*3/uL (ref 0.7–4.0)
MCHC: 33.7 g/dL (ref 30.0–36.0)
MCV: 94.5 fl (ref 78.0–100.0)
Monocytes Absolute: 0.3 10*3/uL (ref 0.1–1.0)
Monocytes Relative: 8.2 % (ref 3.0–12.0)
Neutro Abs: 2.4 10*3/uL (ref 1.4–7.7)
Neutrophils Relative %: 57.4 % (ref 43.0–77.0)
Platelets: 178 10*3/uL (ref 150.0–400.0)
RBC: 4.46 Mil/uL (ref 3.87–5.11)
RDW: 13.8 % (ref 11.5–15.5)
WBC: 4.2 10*3/uL (ref 4.0–10.5)

## 2018-07-04 LAB — LIPID PANEL
Cholesterol: 166 mg/dL (ref 0–200)
HDL: 45.8 mg/dL (ref >39.00)
LDL Cholesterol: 95 mg/dL (ref 0–99)
NonHDL: 119.97
Total CHOL/HDL Ratio: 4
Triglycerides: 123 mg/dL (ref 0.0–149.0)
VLDL: 24.6 mg/dL (ref 0.0–40.0)

## 2018-07-04 LAB — HM PAP SMEAR: HM Pap smear: NEGATIVE

## 2018-07-04 LAB — POCT GLYCOSYLATED HEMOGLOBIN (HGB A1C): Hemoglobin A1C: 6.3 % — AB (ref 4.0–5.6)

## 2018-07-04 LAB — BASIC METABOLIC PANEL
BUN: 19 mg/dL (ref 6–23)
CO2: 28 mEq/L (ref 19–32)
Calcium: 9.6 mg/dL (ref 8.4–10.5)
Chloride: 102 mEq/L (ref 96–112)
Creatinine, Ser: 0.97 mg/dL (ref 0.40–1.20)
GFR: 61.42 mL/min (ref >60.00)
Glucose, Bld: 107 mg/dL — ABNORMAL HIGH (ref 70–99)
Potassium: 4.1 mEq/L (ref 3.5–5.1)
Sodium: 137 mEq/L (ref 135–145)

## 2018-07-04 LAB — MICROALBUMIN / CREATININE URINE RATIO
Creatinine,U: 109 mg/dL
Microalb Creat Ratio: 4.2 mg/g (ref 0.0–30.0)
Microalb, Ur: 4.6 mg/dL — ABNORMAL HIGH (ref 0.0–1.9)

## 2018-07-04 LAB — VITAMIN B12: Vitamin B-12: 766 pg/mL (ref 211–911)

## 2018-07-04 LAB — VITAMIN D 25 HYDROXY (VIT D DEFICIENCY, FRACTURES): VITD: 62.93 ng/mL (ref 30.00–100.00)

## 2018-07-04 NOTE — Patient Instructions (Addendum)
Let's check the treadmill test.  you will receive a phone call, about a day and time for an appointment. blood tests are requested for you today.  We'll let you know about the results.   Please consider these measures for your health:  minimize alcohol.  Do not use tobacco products.  Have a colonoscopy at least every 10 years from age 64.  Women should have an annual mammogram from age 62.  Keep firearms safely stored.  Always use seat belts.  have working smoke alarms in your home.  See an eye doctor and dentist regularly.  Never drive under the influence of alcohol or drugs (including prescription drugs).  Those with fair skin should take precautions against the sun, and should carefully examine their skin once per month, for any new or changed moles. Please come back for a follow-up appointment in 6 months, for the diabetes.

## 2018-07-04 NOTE — Progress Notes (Signed)
Subjective:    Patient ID: Savannah [name], female    DOB: [birth year], 62 y.o.   MRN: [id number]  HPI Pt is here for regular wellness examination, and is feeling pretty well in general, and says chronic med probs are stable, except as noted below Past Medical History:  Diagnosis Date  . ABDOMINAL PAIN, CHRONIC 10/20/2009  . Acute cystitis 08/17/2009  . ALLERGIC RHINITIS 11/21/2007  . Allergy   . ASYMPTOMATIC POSTMENOPAUSAL STATUS 09/29/2008  . Bariatric surgery status 07/07/2010  . Cancer ([place])    uterine  . Cataract   . DEPRESSION 11/21/2007  . DIABETES MELLITUS, TYPE II 07/08/2007  . DYSPNEA 05/20/2009  . Edema 05/20/2009  . Eosinophilic esophagitis 7/0/9628  . FEVER UNSPECIFIED 10/20/2009  . FEVER, HX OF 03/09/2010  . GERD (gastroesophageal reflux disease)   . Headache(784.0) 02/06/2010  . Hepatomegaly 01/06/2008  . HYPERLIPIDEMIA 07/08/2007  . HYPERTENSION 07/08/2007  . LEUKOPENIA, MILD 09/29/2008  . OBESITY 01/06/2008  . OSTEOARTHRITIS 01/06/2008  . Other chronic nonalcoholic liver disease 3/66/2947  . PERIPHERAL NEUROPATHY 11/21/2007  . Peripheral neuropathy   . Postsurgical hypothyroidism 09/19/2010  . SINUSITIS- ACUTE-NOS 11/21/2007  . TB SKIN TEST, POSITIVE 02/06/2010  . THYROID NODULE 03/09/2010    Past Surgical History:  Procedure Laterality Date  . ABDOMINAL HYSTERECTOMY    . BASAL CELL CARCINOMA EXCISION    . COLONOSCOPY    . KNEE ARTHROSCOPY Left   . OOPHORECTOMY    . Right total knee replacement    . ROUX-EN-Y GASTRIC BYPASS  2011  . SKIN TAG REMOVAL    . THYROIDECTOMY    . TONSILLECTOMY AND ADENOIDECTOMY    . UPPER GASTROINTESTINAL ENDOSCOPY      Social History   Socioeconomic History  . Marital status: Single    Spouse name: Not on file  . Number of children: 0  . Years of education: Not on file  . Highest education level: Not on file  Occupational History  . Occupation: Optometrist  Social Needs  . Financial resource strain: Not on file  . Food insecurity:   Worry: Not on file    Inability: Not on file  . Transportation needs:    Medical: Not on file    Non-medical: Not on file  Tobacco Use  . Smoking status: Former Smoker    Packs/day: 1.50    Years: 15.00    Pack years: 22.50    Types: Cigarettes  . Smokeless tobacco: Never Used  Substance and Sexual Activity  . Alcohol use: Yes    Alcohol/week: 0.0 standard drinks    Comment: rarely  . Drug use: Never  . Sexual activity: Not on file  Lifestyle  . Physical activity:    Days per week: Not on file    Minutes per session: Not on file  . Stress: Not on file  Relationships  . Social connections:    Talks on phone: Not on file    Gets together: Not on file    Attends religious service: Not on file    Active member of club or organization: Not on file    Attends meetings of clubs or organizations: Not on file    Relationship status: Not on file  . Intimate partner violence:    Fear of current or ex partner: Not on file    Emotionally abused: Not on file    Physically abused: Not on file    Forced sexual activity: Not on file  Other Topics Concern  .  Not on file  Social History Narrative  . Not on file    Current Outpatient Medications on File Prior to Visit  Medication Sig Dispense Refill  . Cholecalciferol (VITAMIN D3) 400 UNITS tablet Take 400 Units by mouth daily.      . clobetasol cream (TEMOVATE) 0.05 % clobetasol 0.05 % topical cream  APPLY THIN COAT TO AFFECTED AREA TWICE A DAY    . Colchicine (MITIGARE) 0.6 MG CAPS Take 1 capsule by mouth two times daily. 60 capsule 2  . diclofenac sodium (VOLTAREN) 1 % GEL Apply 4 g topically 4 (four) times daily. 100 g 11  . docusate sodium (COLACE) 250 MG capsule Take 500 mg by mouth 2 (two) times daily.      . Dulaglutide (TRULICITY) 1.5 VO/5.3GU SOPN Inject 1.5 mg into the skin once a week. 12 pen 11  . Flaxseed, Linseed, (FLAX SEED OIL PO) Take 1 capsule by mouth daily.      . fluconazole (DIFLUCAN) 150 MG tablet Take 150 mg  by mouth as needed.    . fluticasone (FLONASE) 50 MCG/ACT nasal spray Place 2 sprays into both nostrils as needed.     . gabapentin (NEURONTIN) 300 MG capsule TAKE 2 CAPSULES BY MOUTH TWICE A DAY 360 capsule 2  . halobetasol (ULTRAVATE) 0.05 % cream APPLY TO AFFECTED AREA TWICE A DAY AS NEEDED  2  . HYDROcodone-acetaminophen (NORCO/VICODIN) 5-325 MG tablet Take 1 tablet by mouth every 6 (six) hours as needed for moderate pain. 50 tablet 0  . hydrOXYzine (ATARAX/VISTARIL) 25 MG tablet Take 25 mg by mouth 3 (three) times daily as needed.    . INVOKANA 300 MG TABS tablet TAKE 1 TABLET EVERY DAY BEFORE BREAKFAST 90 tablet 0  . levocetirizine (XYZAL) 5 MG tablet Take 5 mg by mouth every evening.    [name] levothyroxine (SYNTHROID, LEVOTHROID) 125 MCG tablet TAKE 1 TABLET (125 MCG TOTAL) BY MOUTH DAILY BEFORE BREAKFAST. 90 tablet 1  . metFORMIN (GLUCOPHAGE-XR) 500 MG 24 hr tablet TAKE 2 TABLETS BY MOUTH TWICE A DAY 360 tablet 0  . Multiple Vitamin (MULTIVITAMIN) tablet Take 1 tablet by mouth 2 (two) times daily.     . Multiple Vitamins-Minerals (OCUVITE PO) Take by mouth daily.    . ONE TOUCH ULTRA TEST test strip USE TO CHECK BLOOD SUGAR 1 TIME PER DAY. 100 each 5  . pantoprazole (PROTONIX) 40 MG tablet TAKE 1 TABLET (40 MG TOTAL) BY MOUTH DAILY. 90 tablet 0  . PREBIOTIC PRODUCT PO Take by mouth daily.    . Probiotic Product (PROBIOTIC PO) Take by mouth daily.    . repaglinide (PRANDIN) 2 MG tablet TAKE 1 TABLET BY MOUTH 3 TIMES A DAY BEFORE MEALS 180 tablet 1  . simvastatin (ZOCOR) 40 MG tablet TAKE 1 TABLET BY MOUTH EVERYDAY AT BEDTIME 90 tablet 1  . traMADol (ULTRAM) 50 MG tablet Take 1 tablet (50 mg total) by mouth every 6 (six) hours as needed. 100 tablet 2   Current Facility-Administered Medications on File Prior to Visit  Medication Dose Route Frequency Provider Last Rate Last Dose  . 0.9 %  sodium chloride infusion  500 mL Intravenous Continuous Armbruster, Carlota Raspberry, MD        Allergies    Allergen Reactions  . Ace Inhibitors     REACTION: Cough  . Caffeine-Sodium Benzoate     REACTION: PVCs  . Ciprofloxacin Itching  . Nsaids     Gastric Bypass Surgery - unable to take  Family History  Problem Relation Age of Onset  . Multiple sclerosis Sister   . Cancer Sister        breast   . Diabetes Mother   . Diabetes Father   . Liver cancer Maternal Aunt        2 aunt  . Colon cancer Maternal Uncle     BP 122/80 (BP Location: Right Arm, Patient Position: Sitting, Cuff Size: Normal)   Pulse 72   Ht 5\' 11"  (1.803 m)   Wt 170 lb 3.2 oz (77.2 kg)   SpO2 99%   BMI 23.74 kg/m   Review of Systems Denies fever, fatigue, visual loss, hearing loss, palpitations, sob, back pain, depression, BRBPR, hematuria, syncope, allergy sxs, and easy bruising.  She has fatigue, cold intolerance, and chronic bilat foot numbness.     Objective:   Physical Exam VS: see vs page GEN: no distress HEAD: head: no deformity eyes: no periorbital swelling, no proptosis external nose and ears are normal mouth: no lesion seen NECK: a healed scar is present.  I do not appreciate a nodule in the thyroid or elsewhere in the neck CHEST WALL: no deformity LUNGS: clear to auscultation CV: reg rate and rhythm, no murmur ABD: abdomen is soft, nontender.  no hepatosplenomegaly.  not distended.  no hernia MUSCULOSKELETAL: muscle bulk and strength are grossly normal.  no obvious joint swelling.  gait is normal and steady.   PULSES: no carotid bruit NEURO:  cn 2-12 grossly intact.   readily moves all 4's.  SKIN:  Normal texture and temperature.  No rash or suspicious lesion is visible.    NODES:  None palpable at the neck PSYCH: alert, well-oriented.  Does not appear anxious nor depressed.    I personally reviewed electrocardiogram tracing (today): Indication: wellness Impression: NSR.  No MI.  No hypertrophy. Compared to 2018: no significant change      Assessment & Plan:  Wellness visit  today, with problems stable, except as noted.   SEPARATE EVALUATION FOLLOWS--EACH PROBLEM HERE IS NEW, NOT RESPONDING TO TREATMENT, OR POSES SIGNIFICANT RISK TO THE PATIENT'S HEALTH: HISTORY OF THE PRESENT ILLNESS:  Pt returns for f/u of diabetes mellitus:  DM type: 2 Dx'ed: 2633 Complications: painful polyneuropathy, and CAD (seen on CT) Therapy: 4 oral meds GDM: never DKA: never Severe hypoglycemia: never.   Pancreatitis: never.  Other: she took insulin from 2004-2011, when she had gastric bypass surgery.   Interval history: she takes meds as rx'ed. Few mos of intemitt chest pain, lasting few seconds at a time, non-exertional.  She is unable to cite precip or relieving factor.    PAST MEDICAL HISTORY Past Medical History:  Diagnosis Date  . ABDOMINAL PAIN, CHRONIC 10/20/2009  . Acute cystitis 08/17/2009  . ALLERGIC RHINITIS 11/21/2007  . Allergy   . ASYMPTOMATIC POSTMENOPAUSAL STATUS 09/29/2008  . Bariatric surgery status 07/07/2010  . Cancer ([place])    uterine  . Cataract   . DEPRESSION 11/21/2007  . DIABETES MELLITUS, TYPE II 07/08/2007  . DYSPNEA 05/20/2009  . Edema 05/20/2009  . Eosinophilic esophagitis 01/14/4561  . FEVER UNSPECIFIED 10/20/2009  . FEVER, HX OF 03/09/2010  . GERD (gastroesophageal reflux disease)   . Headache(784.0) 02/06/2010  . Hepatomegaly 01/06/2008  . HYPERLIPIDEMIA 07/08/2007  . HYPERTENSION 07/08/2007  . LEUKOPENIA, MILD 09/29/2008  . OBESITY 01/06/2008  . OSTEOARTHRITIS 01/06/2008  . Other chronic nonalcoholic liver disease 5/63/8937  . PERIPHERAL NEUROPATHY 11/21/2007  . Peripheral neuropathy   . Postsurgical hypothyroidism 09/19/2010  .  SINUSITIS- ACUTE-NOS 11/21/2007  . TB SKIN TEST, POSITIVE 02/06/2010  . THYROID NODULE 03/09/2010    Past Surgical History:  Procedure Laterality Date  . ABDOMINAL HYSTERECTOMY    . BASAL CELL CARCINOMA EXCISION    . COLONOSCOPY    . KNEE ARTHROSCOPY Left   . OOPHORECTOMY    . Right total knee replacement    . ROUX-EN-Y  GASTRIC BYPASS  2011  . SKIN TAG REMOVAL    . THYROIDECTOMY    . TONSILLECTOMY AND ADENOIDECTOMY    . UPPER GASTROINTESTINAL ENDOSCOPY      Social History   Socioeconomic History  . Marital status: Single    Spouse name: Not on file  . Number of children: 0  . Years of education: Not on file  . Highest education level: Not on file  Occupational History  . Occupation: Optometrist  Social Needs  . Financial resource strain: Not on file  . Food insecurity:    Worry: Not on file    Inability: Not on file  . Transportation needs:    Medical: Not on file    Non-medical: Not on file  Tobacco Use  . Smoking status: Former Smoker    Packs/day: 1.50    Years: 15.00    Pack years: 22.50    Types: Cigarettes  . Smokeless tobacco: Never Used  Substance and Sexual Activity  . Alcohol use: Yes    Alcohol/week: 0.0 standard drinks    Comment: rarely  . Drug use: Never  . Sexual activity: Not on file  Lifestyle  . Physical activity:    Days per week: Not on file    Minutes per session: Not on file  . Stress: Not on file  Relationships  . Social connections:    Talks on phone: Not on file    Gets together: Not on file    Attends religious service: Not on file    Active member of club or organization: Not on file    Attends meetings of clubs or organizations: Not on file    Relationship status: Not on file  . Intimate partner violence:    Fear of current or ex partner: Not on file    Emotionally abused: Not on file    Physically abused: Not on file    Forced sexual activity: Not on file  Other Topics Concern  . Not on file  Social History Narrative  . Not on file    Current Outpatient Medications on File Prior to Visit  Medication Sig Dispense Refill  . Cholecalciferol (VITAMIN D3) 400 UNITS tablet Take 400 Units by mouth daily.      . clobetasol cream (TEMOVATE) 0.05 % clobetasol 0.05 % topical cream  APPLY THIN COAT TO AFFECTED AREA TWICE A DAY    . Colchicine  (MITIGARE) 0.6 MG CAPS Take 1 capsule by mouth two times daily. 60 capsule 2  . diclofenac sodium (VOLTAREN) 1 % GEL Apply 4 g topically 4 (four) times daily. 100 g 11  . docusate sodium (COLACE) 250 MG capsule Take 500 mg by mouth 2 (two) times daily.      . Dulaglutide (TRULICITY) 1.5 TK/3.5WS SOPN Inject 1.5 mg into the skin once a week. 12 pen 11  . Flaxseed, Linseed, (FLAX SEED OIL PO) Take 1 capsule by mouth daily.      . fluconazole (DIFLUCAN) 150 MG tablet Take 150 mg by mouth as needed.    . fluticasone (FLONASE) 50 MCG/ACT nasal spray Place 2 sprays  into both nostrils as needed.     . gabapentin (NEURONTIN) 300 MG capsule TAKE 2 CAPSULES BY MOUTH TWICE A DAY 360 capsule 2  . halobetasol (ULTRAVATE) 0.05 % cream APPLY TO AFFECTED AREA TWICE A DAY AS NEEDED  2  . HYDROcodone-acetaminophen (NORCO/VICODIN) 5-325 MG tablet Take 1 tablet by mouth every 6 (six) hours as needed for moderate pain. 50 tablet 0  . hydrOXYzine (ATARAX/VISTARIL) 25 MG tablet Take 25 mg by mouth 3 (three) times daily as needed.    . INVOKANA 300 MG TABS tablet TAKE 1 TABLET EVERY DAY BEFORE BREAKFAST 90 tablet 0  . levocetirizine (XYZAL) 5 MG tablet Take 5 mg by mouth every evening.    [name] levothyroxine (SYNTHROID, LEVOTHROID) 125 MCG tablet TAKE 1 TABLET (125 MCG TOTAL) BY MOUTH DAILY BEFORE BREAKFAST. 90 tablet 1  . metFORMIN (GLUCOPHAGE-XR) 500 MG 24 hr tablet TAKE 2 TABLETS BY MOUTH TWICE A DAY 360 tablet 0  . Multiple Vitamin (MULTIVITAMIN) tablet Take 1 tablet by mouth 2 (two) times daily.     . Multiple Vitamins-Minerals (OCUVITE PO) Take by mouth daily.    . ONE TOUCH ULTRA TEST test strip USE TO CHECK BLOOD SUGAR 1 TIME PER DAY. 100 each 5  . pantoprazole (PROTONIX) 40 MG tablet TAKE 1 TABLET (40 MG TOTAL) BY MOUTH DAILY. 90 tablet 0  . PREBIOTIC PRODUCT PO Take by mouth daily.    . Probiotic Product (PROBIOTIC PO) Take by mouth daily.    . repaglinide (PRANDIN) 2 MG tablet TAKE 1 TABLET BY MOUTH 3 TIMES A  DAY BEFORE MEALS 180 tablet 1  . simvastatin (ZOCOR) 40 MG tablet TAKE 1 TABLET BY MOUTH EVERYDAY AT BEDTIME 90 tablet 1  . traMADol (ULTRAM) 50 MG tablet Take 1 tablet (50 mg total) by mouth every 6 (six) hours as needed. 100 tablet 2   Current Facility-Administered Medications on File Prior to Visit  Medication Dose Route Frequency Provider Last Rate Last Dose  . 0.9 %  sodium chloride infusion  500 mL Intravenous Continuous Armbruster, Carlota Raspberry, MD        Allergies  Allergen Reactions  . Ace Inhibitors     REACTION: Cough  . Caffeine-Sodium Benzoate     REACTION: PVCs  . Ciprofloxacin Itching  . Nsaids     Gastric Bypass Surgery - unable to take    Family History  Problem Relation Age of Onset  . Multiple sclerosis Sister   . Cancer Sister        breast   . Diabetes Mother   . Diabetes Father   . Liver cancer Maternal Aunt        2 aunt  . Colon cancer Maternal Uncle     BP 122/80 (BP Location: Right Arm, Patient Position: Sitting, Cuff Size: Normal)   Pulse 72   Ht 5\' 11"  (1.803 m)   Wt 170 lb 3.2 oz (77.2 kg)   SpO2 99%   BMI 23.74 kg/m   REVIEW OF SYSTEMS: She has multiple bruises of the forearms PHYSICAL EXAMINATION: VITAL SIGNS:  See vs page GENERAL: no distress Multiple abrasions and ecchymoses, on the forearms. Pulses: foot pulses are intact bilaterally.   MSK: no deformity of the feet or ankles.  CV: trace bilat edema of the legs or ankles Skin:  no ulcer on the feet or ankles.  normal color and temp on the feet and ankles Neuro: sensation is intact to touch on the feet and ankles, but decreased from  normal.   LAB/XRAY RESULTS:  A1c=6.3% IMPRESSION: Chest pain, new Type 2 DM: well-controlled. ecchymoses PLAN:  Check treadmill Please continue the same medications Check cbc

## 2018-07-07 DIAGNOSIS — H25813 Combined forms of age-related cataract, bilateral: Secondary | ICD-10-CM | POA: Diagnosis not present

## 2018-07-07 DIAGNOSIS — H04123 Dry eye syndrome of bilateral lacrimal glands: Secondary | ICD-10-CM | POA: Diagnosis not present

## 2018-07-07 DIAGNOSIS — H353131 Nonexudative age-related macular degeneration, bilateral, early dry stage: Secondary | ICD-10-CM | POA: Diagnosis not present

## 2018-07-08 ENCOUNTER — Other Ambulatory Visit: Payer: Self-pay | Admitting: Endocrinology

## 2018-07-08 DIAGNOSIS — Z803 Family history of malignant neoplasm of breast: Secondary | ICD-10-CM | POA: Diagnosis not present

## 2018-07-08 DIAGNOSIS — R928 Other abnormal and inconclusive findings on diagnostic imaging of breast: Secondary | ICD-10-CM | POA: Diagnosis not present

## 2018-07-08 DIAGNOSIS — N6459 Other signs and symptoms in breast: Secondary | ICD-10-CM | POA: Diagnosis not present

## 2018-07-08 LAB — HM MAMMOGRAPHY

## 2018-07-11 DIAGNOSIS — D2272 Melanocytic nevi of left lower limb, including hip: Secondary | ICD-10-CM | POA: Diagnosis not present

## 2018-07-11 DIAGNOSIS — D225 Melanocytic nevi of trunk: Secondary | ICD-10-CM | POA: Diagnosis not present

## 2018-07-11 DIAGNOSIS — D485 Neoplasm of uncertain behavior of skin: Secondary | ICD-10-CM | POA: Diagnosis not present

## 2018-07-11 DIAGNOSIS — L900 Lichen sclerosus et atrophicus: Secondary | ICD-10-CM | POA: Diagnosis not present

## 2018-07-11 DIAGNOSIS — Z1283 Encounter for screening for malignant neoplasm of skin: Secondary | ICD-10-CM | POA: Diagnosis not present

## 2018-07-16 ENCOUNTER — Other Ambulatory Visit: Payer: Self-pay | Admitting: Rheumatology

## 2018-07-16 NOTE — Telephone Encounter (Signed)
Last visit: 04/18/2018 Next visit: 10/17/2018 Labs: 07/04/2018 elevated glucose Uric acid: 01/10/2018 3.8  Okay to refill per Dr. Estanislado Pandy.

## 2018-07-19 ENCOUNTER — Other Ambulatory Visit: Payer: Self-pay | Admitting: Endocrinology

## 2018-07-23 DIAGNOSIS — Z23 Encounter for immunization: Secondary | ICD-10-CM | POA: Diagnosis not present

## 2018-07-28 ENCOUNTER — Telehealth: Payer: Self-pay | Admitting: Endocrinology

## 2018-07-28 DIAGNOSIS — R079 Chest pain, unspecified: Secondary | ICD-10-CM

## 2018-07-28 DIAGNOSIS — H2511 Age-related nuclear cataract, right eye: Secondary | ICD-10-CM | POA: Diagnosis not present

## 2018-07-28 NOTE — Addendum Note (Signed)
Addended by: Renato Shin on: 07/28/2018 04:55 PM   Modules accepted: Orders

## 2018-07-28 NOTE — Telephone Encounter (Signed)
Pt stated she has not heard anything about the treadmill test and would like a referral to an cardiologist and a permanent handicap  plate

## 2018-07-28 NOTE — Telephone Encounter (Signed)
I ordered treadmill test.  Has this been scheduled?  Is pt requesting to see cardiol also or instead of?

## 2018-07-28 NOTE — Telephone Encounter (Signed)
Pt dropped off handicap placard/license plate forms today-placed in ellisons box at the front  Pt is requesting a referral to cardiac please

## 2018-07-28 NOTE — Telephone Encounter (Signed)
Ok, I did referral.  Please give me handicapped form--i'll sign it.

## 2018-07-29 NOTE — Telephone Encounter (Signed)
Placed on your desk. 

## 2018-07-29 NOTE — Telephone Encounter (Signed)
Left detailed message on voicemail that parking / plate applications are complete and ready to pick up at the front desk. Copy sent for scanning.

## 2018-07-29 NOTE — Telephone Encounter (Signed)
done

## 2018-08-01 ENCOUNTER — Telehealth: Payer: Self-pay | Admitting: Endocrinology

## 2018-08-01 NOTE — Telephone Encounter (Signed)
Patient is waiting on referrals for stress test/cardiology. Please call patient and advise.

## 2018-08-04 NOTE — Telephone Encounter (Signed)
In regards to referral

## 2018-08-07 DIAGNOSIS — M7732 Calcaneal spur, left foot: Secondary | ICD-10-CM | POA: Diagnosis not present

## 2018-08-07 DIAGNOSIS — M722 Plantar fascial fibromatosis: Secondary | ICD-10-CM | POA: Diagnosis not present

## 2018-08-12 DIAGNOSIS — H25811 Combined forms of age-related cataract, right eye: Secondary | ICD-10-CM | POA: Diagnosis not present

## 2018-08-12 DIAGNOSIS — H268 Other specified cataract: Secondary | ICD-10-CM | POA: Diagnosis not present

## 2018-08-12 DIAGNOSIS — H2511 Age-related nuclear cataract, right eye: Secondary | ICD-10-CM | POA: Diagnosis not present

## 2018-08-15 DIAGNOSIS — M722 Plantar fascial fibromatosis: Secondary | ICD-10-CM | POA: Diagnosis not present

## 2018-08-15 NOTE — Telephone Encounter (Signed)
Just wanted to keep you in the loop Tashia, reached out to cardiology and got pt scheduled.  Patient is aware and was informed it was just a mistake on the location of the referral it was placed in the CV research que accidentally when Dr. Loanne Drilling placed the referral.

## 2018-08-17 ENCOUNTER — Other Ambulatory Visit: Payer: Self-pay | Admitting: Endocrinology

## 2018-08-19 ENCOUNTER — Ambulatory Visit: Payer: 59 | Admitting: Cardiovascular Disease

## 2018-08-21 ENCOUNTER — Other Ambulatory Visit: Payer: Self-pay | Admitting: Endocrinology

## 2018-08-21 DIAGNOSIS — M722 Plantar fascial fibromatosis: Secondary | ICD-10-CM | POA: Diagnosis not present

## 2018-08-22 DIAGNOSIS — H2512 Age-related nuclear cataract, left eye: Secondary | ICD-10-CM | POA: Diagnosis not present

## 2018-09-02 DIAGNOSIS — H25812 Combined forms of age-related cataract, left eye: Secondary | ICD-10-CM | POA: Diagnosis not present

## 2018-09-02 DIAGNOSIS — H268 Other specified cataract: Secondary | ICD-10-CM | POA: Diagnosis not present

## 2018-09-02 DIAGNOSIS — H2512 Age-related nuclear cataract, left eye: Secondary | ICD-10-CM | POA: Diagnosis not present

## 2018-09-04 DIAGNOSIS — M722 Plantar fascial fibromatosis: Secondary | ICD-10-CM | POA: Diagnosis not present

## 2018-09-04 DIAGNOSIS — M71572 Other bursitis, not elsewhere classified, left ankle and foot: Secondary | ICD-10-CM | POA: Diagnosis not present

## 2018-09-05 ENCOUNTER — Encounter: Payer: Self-pay | Admitting: Internal Medicine

## 2018-09-05 ENCOUNTER — Ambulatory Visit: Payer: 59 | Admitting: Internal Medicine

## 2018-09-05 VITALS — BP 118/78 | HR 68

## 2018-09-05 DIAGNOSIS — I25119 Atherosclerotic heart disease of native coronary artery with unspecified angina pectoris: Secondary | ICD-10-CM

## 2018-09-05 DIAGNOSIS — R079 Chest pain, unspecified: Secondary | ICD-10-CM | POA: Diagnosis not present

## 2018-09-05 MED ORDER — ATORVASTATIN CALCIUM 40 MG PO TABS: 40.0000 mg | ORAL_TABLET | Freq: Every day | ORAL | 3 refills | Status: DC

## 2018-09-05 NOTE — Patient Instructions (Signed)
Medication Instructions:  Your physician has recommended you make the following change in your medication:  1) STOP Simvastatin 2) START Atorvastatin (Lipitor) 40mg  daily. An Rx has been sent to your pharmacy  If you need a refill on your cardiac medications before your next appointment, please call your pharmacy.   Lab work: None ordered  Testing/Procedures: Your physician has requested that you have a stress echocardiogram. For further information please visit [email or web address]. Please follow instruction sheet as given.    Follow-Up: At [facility], you and your health needs are our priority.  As part of our continuing mission to provide you with exceptional heart care, we have created designated Provider Care Teams.  These Care Teams include your primary Cardiologist (physician) and Advanced Practice Providers (APPs -  Physician Assistants and Nurse Practitioners) who all work together to provide you with the care you need, when you need it. Your physician recommends that you schedule a follow-up appointment in: 2-3 weeks after your stress echo   Any Other Special Instructions Will Be Listed Below (If Applicable).

## 2018-09-05 NOTE — Progress Notes (Signed)
Cor c

## 2018-09-08 ENCOUNTER — Telehealth (HOSPITAL_COMMUNITY): Payer: Self-pay

## 2018-09-08 NOTE — Telephone Encounter (Signed)
Pt contacted for her Stress ECHO test on Thursday 09/11/2018. Pt stated that she understood and would be here.S.Jimmye Norman EMTP

## 2018-09-10 NOTE — Consult Note (Signed)
Cardiology Office Note:    Date:  09/10/2018   ID:  Savannah [name], DOB [birth year], MRN [id number]  PCP:  Renato Shin, MD  Cardiologist:  No primary care provider on file.  Electrophysiologist:  None   Referring MD: Renato Shin, MD   Chest pain  History of Present Illness:    Savannah [name] is a 62 y.o. female with a complex past medical history presents today for pain along the left axillary line and sharp chest pain for several weeks to months.  It is largely nonexertional, however she will have discomfort in her chest and neck when she is walking very quickly through the airport.  She travels extensively for her job.  If she rests the discomfort is relieved.  She also experiences sharp chest pains in her chest as well as along her left axillary line.  Her past medical history is significant for PVCs, esophageal rings with multiple previous dilations, bariatric surgery in 2011 with significant weight loss.  She endorses chest pain, shortness of breath, palpitations.  She endorses edema, but no significant PND or orthopnea.  She denies frequent syncope or presyncope.  Past Medical History:  Diagnosis Date  . ABDOMINAL PAIN, CHRONIC 10/20/2009  . Acute cystitis 08/17/2009  . ALLERGIC RHINITIS 11/21/2007  . Allergy   . ASYMPTOMATIC POSTMENOPAUSAL STATUS 09/29/2008  . Bariatric surgery status 07/07/2010  . Cancer ([place])    uterine  . Cataract   . DEPRESSION 11/21/2007  . DIABETES MELLITUS, TYPE II 07/08/2007  . DYSPNEA 05/20/2009  . Edema 05/20/2009  . Eosinophilic esophagitis 06/13/5002  . FEVER UNSPECIFIED 10/20/2009  . FEVER, HX OF 03/09/2010  . GERD (gastroesophageal reflux disease)   . Headache(784.0) 02/06/2010  . Hepatomegaly 01/06/2008  . HYPERLIPIDEMIA 07/08/2007  . HYPERTENSION 07/08/2007  . LEUKOPENIA, MILD 09/29/2008  . OBESITY 01/06/2008  . OSTEOARTHRITIS 01/06/2008  . Other chronic nonalcoholic liver disease 05/15/8888  . PERIPHERAL NEUROPATHY 11/21/2007  . Peripheral neuropathy     . Postsurgical hypothyroidism 09/19/2010  . SINUSITIS- ACUTE-NOS 11/21/2007  . TB SKIN TEST, POSITIVE 02/06/2010  . THYROID NODULE 03/09/2010    Past Surgical History:  Procedure Laterality Date  . ABDOMINAL HYSTERECTOMY    . BASAL CELL CARCINOMA EXCISION    . COLONOSCOPY    . KNEE ARTHROSCOPY Left   . OOPHORECTOMY    . Right total knee replacement    . ROUX-EN-Y GASTRIC BYPASS  2011  . SKIN TAG REMOVAL    . THYROIDECTOMY    . TONSILLECTOMY AND ADENOIDECTOMY    . UPPER GASTROINTESTINAL ENDOSCOPY      Current Medications: Current Meds  Medication Sig  . Cholecalciferol (VITAMIN D3) 400 UNITS tablet Take 400 Units by mouth daily.    . clobetasol cream (TEMOVATE) 0.05 % clobetasol 0.05 % topical cream  APPLY THIN COAT TO AFFECTED AREA TWICE A DAY  . diclofenac sodium (VOLTAREN) 1 % GEL Apply 4 g topically 4 (four) times daily.  [name] docusate sodium (COLACE) 250 MG capsule Take 500 mg by mouth 2 (two) times daily.    . Dulaglutide (TRULICITY) 1.5 VQ/9.4HW SOPN Inject 1.5 mg into the skin once a week.  . Flaxseed, Linseed, (FLAX SEED OIL PO) Take 1 capsule by mouth daily.    . fluconazole (DIFLUCAN) 150 MG tablet Take 150 mg by mouth as needed.  . fluticasone (FLONASE) 50 MCG/ACT nasal spray Place 2 sprays into both nostrils as needed.   . gabapentin (NEURONTIN) 300 MG capsule TAKE 2 CAPSULES BY MOUTH TWICE  A DAY  . halobetasol (ULTRAVATE) 0.05 % cream APPLY TO AFFECTED AREA TWICE A DAY AS NEEDED  . HYDROcodone-acetaminophen (NORCO/VICODIN) 5-325 MG tablet Take 1 tablet by mouth every 6 (six) hours as needed for moderate pain.  . hydrOXYzine (ATARAX/VISTARIL) 25 MG tablet Take 25 mg by mouth 3 (three) times daily as needed.  . INVOKANA 300 MG TABS tablet TAKE 1 TABLET EVERY DAY BEFORE BREAKFAST  . levocetirizine (XYZAL) 5 MG tablet Take 5 mg by mouth every evening.  [name] levothyroxine (SYNTHROID, LEVOTHROID) 125 MCG tablet TAKE 1 TABLET (125 MCG TOTAL) BY MOUTH DAILY BEFORE BREAKFAST.  .  metFORMIN (GLUCOPHAGE-XR) 500 MG 24 hr tablet TAKE 2 TABLETS BY MOUTH TWICE A DAY  . MITIGARE 0.6 MG CAPS TAKE 1 CAPSULE BY MOUTH TWICE A DAY  . Multiple Vitamin (MULTIVITAMIN) tablet Take 1 tablet by mouth 2 (two) times daily.   . Multiple Vitamins-Minerals (OCUVITE PO) Take by mouth daily.  . ONE TOUCH ULTRA TEST test strip USE TO CHECK BLOOD SUGAR 1 TIME PER DAY.  . pantoprazole (PROTONIX) 40 MG tablet TAKE 1 TABLET (40 MG TOTAL) BY MOUTH DAILY.  [name] PREBIOTIC PRODUCT PO Take by mouth daily.  . Probiotic Product (PROBIOTIC PO) Take by mouth daily.  . repaglinide (PRANDIN) 2 MG tablet TAKE 1 TABLET BY MOUTH 3 TIMES A DAY BEFORE MEALS  . traMADol (ULTRAM) 50 MG tablet Take 1 tablet (50 mg total) by mouth every 6 (six) hours as needed.  . [DISCONTINUED] simvastatin (ZOCOR) 40 MG tablet TAKE 1 TABLET BY MOUTH EVERYDAY AT BEDTIME   Current Facility-Administered Medications for the 09/05/18 encounter (Office Visit) with Elouise Munroe, MD  Medication  . 0.9 %  sodium chloride infusion     Allergies:   Ace inhibitors; Caffeine-sodium benzoate; Ciprofloxacin; and Nsaids   Social History   Socioeconomic History  . Marital status: Single    Spouse name: Not on file  . Number of children: 0  . Years of education: Not on file  . Highest education level: Not on file  Occupational History  . Occupation: Optometrist  Social Needs  . Financial resource strain: Not on file  . Food insecurity:    Worry: Not on file    Inability: Not on file  . Transportation needs:    Medical: Not on file    Non-medical: Not on file  Tobacco Use  . Smoking status: Former Smoker    Packs/day: 1.50    Years: 15.00    Pack years: 22.50    Types: Cigarettes  . Smokeless tobacco: Never Used  Substance and Sexual Activity  . Alcohol use: Yes    Alcohol/week: 0.0 standard drinks    Comment: rarely  . Drug use: Never  . Sexual activity: Not on file  Lifestyle  . Physical activity:    Days per week: Not  on file    Minutes per session: Not on file  . Stress: Not on file  Relationships  . Social connections:    Talks on phone: Not on file    Gets together: Not on file    Attends religious service: Not on file    Active member of club or organization: Not on file    Attends meetings of clubs or organizations: Not on file    Relationship status: Not on file  Other Topics Concern  . Not on file  Social History Narrative  . Not on file     Family History: The patient's family history includes  Cancer in her sister; Colon cancer in her maternal uncle; Diabetes in her father and mother; Liver cancer in her maternal aunt; Multiple sclerosis in her sister.  ROS:   Please see the history of present illness.    All other systems reviewed and are negative.  EKGs/Labs/Other Studies Reviewed:    The following studies were reviewed today:   EKG:  EKG is ordered today.  The ekg ordered today demonstrates NSR, ventricular rate 68.  Recent Labs: 03/21/2018: ALT 23; Magnesium 2.2; TSH 0.45 07/04/2018: BUN 19; Creatinine, Ser 0.97; Hemoglobin 14.2; Platelets 178.0; Potassium 4.1; Sodium 137  Recent Lipid Panel    Component Value Date/Time   CHOL 166 07/04/2018 1403   TRIG 123.0 07/04/2018 1403   HDL 45.80 07/04/2018 1403   CHOLHDL 4 07/04/2018 1403   VLDL 24.6 07/04/2018 1403   LDLCALC 95 07/04/2018 1403   LDLDIRECT 99.0 10/12/2011 1219    Physical Exam:    VS:  BP 118/78 (BP Location: Right Arm, Patient Position: Sitting, Cuff Size: Normal)   Pulse 68     Wt Readings from Last 3 Encounters:  07/04/18 170 lb 3.2 oz (77.2 kg)  04/18/18 172 lb (78 kg)  03/21/18 180 lb (81.6 kg)     GEN: Well nourished, well developed in no acute distress HEENT: Normal NECK: No JVD; No carotid bruits LYMPHATICS: No lymphadenopathy CARDIAC: RRR, no murmurs, rubs, gallops RESPIRATORY:  Clear to auscultation without rales, wheezing or rhonchi  ABDOMEN: Soft, non-tender,  non-distended MUSCULOSKELETAL:  No edema; No deformity  SKIN: Warm and dry NEUROLOGIC:  Alert and oriented x 3 PSYCHIATRIC:  Normal affect   ASSESSMENT:    1. Chest pain, unspecified type   2. Coronary artery disease involving native heart with angina pectoris, unspecified vessel or lesion type ([place])    PLAN:    In order of problems listed above:  She has several episodes of chest pain that seem atypical and noncardiac.  However when she walks briskly in the airport and gets chest and throat tightness, this is more concerning for angina.  She does have coronary calcifications on CT chest performed in September 2018 for bronchitis.  With this in mind we will perform a treadmill stress echocardiogram, given that we do not have a baseline echocardiogram.  On resting echo I am particularly interested in diastolic function, and with stress regional wall motion abnormalities.  If her testing is benign, I imagine her chest pain is noncardiac.  Her Epworth Sleepiness Scale is 21, we will need to follow-up regarding sleep apnea.  I will see her back in 2 to 3 weeks after stress echo is complete for further recommendations.   Medication Adjustments/Labs and Tests Ordered: Current medicines are reviewed at length with the patient today.  Concerns regarding medicines are outlined above.  Orders Placed This Encounter  Procedures  . EKG 12-Lead  . ECHOCARDIOGRAM STRESS TEST   Meds ordered this encounter  Medications  . atorvastatin (LIPITOR) 40 MG tablet    Sig: Take 1 tablet (40 mg total) by mouth daily.    Dispense:  90 tablet    Refill:  3    Patient Instructions  Medication Instructions:  Your physician has recommended you make the following change in your medication:  1) STOP Simvastatin 2) START Atorvastatin (Lipitor) 79m daily. An Rx has been sent to your pharmacy  If you need a refill on your cardiac medications before your next appointment, please call your pharmacy.   Lab  work: None  ordered  Testing/Procedures: Your physician has requested that you have a stress echocardiogram. For further information please visit [email or web address]. Please follow instruction sheet as given.    Follow-Up: At [facility], you and your health needs are our priority.  As part of our continuing mission to provide you with exceptional heart care, we have created designated Provider Care Teams.  These Care Teams include your primary Cardiologist (physician) and Advanced Practice Providers (APPs -  Physician Assistants and Nurse Practitioners) who all work together to provide you with the care you need, when you need it. Your physician recommends that you schedule a follow-up appointment in: 2-3 weeks after your stress echo   Any Other Special Instructions Will Be Listed Below (If Applicable).       Signed, Elouise Munroe, MD  09/10/2018 6:13 PM    [facility]

## 2018-09-11 ENCOUNTER — Other Ambulatory Visit: Payer: Self-pay

## 2018-09-11 ENCOUNTER — Ambulatory Visit (HOSPITAL_COMMUNITY): Payer: 59 | Attending: Internal Medicine

## 2018-09-11 ENCOUNTER — Ambulatory Visit (HOSPITAL_BASED_OUTPATIENT_CLINIC_OR_DEPARTMENT_OTHER): Payer: 59

## 2018-09-11 DIAGNOSIS — R079 Chest pain, unspecified: Secondary | ICD-10-CM | POA: Diagnosis not present

## 2018-09-11 DIAGNOSIS — I25119 Atherosclerotic heart disease of native coronary artery with unspecified angina pectoris: Secondary | ICD-10-CM | POA: Insufficient documentation

## 2018-09-11 MED ORDER — PERFLUTREN LIPID MICROSPHERE
1.0000 mL | INTRAVENOUS | Status: AC | PRN
  Administered 2018-09-11 (×2): 2 mL via INTRAVENOUS

## 2018-09-12 ENCOUNTER — Other Ambulatory Visit: Payer: Self-pay | Admitting: Rheumatology

## 2018-09-12 ENCOUNTER — Encounter: Payer: Self-pay | Admitting: Endocrinology

## 2018-09-15 ENCOUNTER — Other Ambulatory Visit: Payer: Self-pay

## 2018-09-15 MED ORDER — METFORMIN HCL ER 500 MG PO TB24: 1000.0000 mg | ORAL_TABLET | Freq: Two times a day (BID) | ORAL | 0 refills | Status: DC

## 2018-09-16 ENCOUNTER — Telehealth: Payer: Self-pay

## 2018-09-16 DIAGNOSIS — R079 Chest pain, unspecified: Secondary | ICD-10-CM

## 2018-09-16 MED ORDER — METOPROLOL TARTRATE 100 MG PO TABS: ORAL_TABLET | ORAL | 0 refills | Status: DC

## 2018-09-16 NOTE — Telephone Encounter (Signed)
Pt aware stress echo results and Dr.Acharya's recommendation. Pt is agreeable with proceeding with the cor CTA.  Adv pt that a scheduler will call her directly to schedule, adv her it could take 1-2 weeks before she is called.  Pt asked the pre-testing instruction be my charted to her. Adv her that I will cancel her apt sch for 11/22 with Dr.A and f/u can be determined after testing is complete. Adv pt to contact the office sooner of symptoms develop or assistance is needed.

## 2018-09-18 ENCOUNTER — Other Ambulatory Visit: Payer: Self-pay | Admitting: Endocrinology

## 2018-10-03 ENCOUNTER — Ambulatory Visit: Payer: 59 | Admitting: Internal Medicine

## 2018-10-03 NOTE — Progress Notes (Signed)
Office Visit Note  Patient: Savannah [name]             Date of Birth: [birth year]           MRN: [id number]             PCP: Renato Shin, MD Referring: Renato Shin, MD Visit Date: 10/17/2018 Occupation: @GUAROCC @  Subjective:  Pain in both hands.   History of Present Illness: Savannah [name] is a 62 y.o. female with history of osteoarthritis.  She states she has been traveling and working which is causing increased joint pain and discomfort.  She has been having pain and discomfort in her right wrist joint, bilateral hands, bilateral knees and her feet.  Her right knee has been replaced.  Denies any joint swelling.  Activities of Daily Living:  Patient reports morning stiffness for 0 minute.   Patient Reports nocturnal pain.  Difficulty dressing/grooming: Denies Difficulty climbing stairs: Reports Difficulty getting out of chair: Reports Difficulty using hands for taps, buttons, cutlery, and/or writing: Reports  Review of Systems  Constitutional: Positive for fatigue. Negative for night sweats, weight gain and weight loss.  HENT: Positive for mouth dryness. Negative for mouth sores, trouble swallowing, trouble swallowing and nose dryness.   Eyes: Negative for pain, redness, visual disturbance and dryness.  Respiratory: Negative for cough, shortness of breath and difficulty breathing.   Cardiovascular: Positive for palpitations. Negative for chest pain, hypertension, irregular heartbeat and swelling in legs/feet.       Seeing cardiologist  Gastrointestinal: Negative for blood in stool, constipation and diarrhea.  Endocrine: Negative for increased urination.  Genitourinary: Negative for vaginal dryness.  Musculoskeletal: Positive for arthralgias and joint pain. Negative for joint swelling, myalgias, muscle weakness, morning stiffness, muscle tenderness and myalgias.  Skin: Negative for color change, rash, hair loss, skin tightness, ulcers and sensitivity to sunlight.    Allergic/Immunologic: Negative for susceptible to infections.  Neurological: Negative for dizziness, memory loss, night sweats and weakness.  Hematological: Negative for swollen glands.  Psychiatric/Behavioral: Negative for depressed mood and sleep disturbance. The patient is not nervous/anxious.     PMFS History:  Patient Active Problem List   Diagnosis Date Noted  . Chest pain 07/04/2018  . Cramp in limb 07/04/2018  . Primary osteoarthritis of both feet 04/11/2018  . Primary osteoarthritis of left knee 04/11/2018  . Left hand pain 11/08/2017  . Memory change 06/07/2017  . Abnormal chest CT 04/10/2017  . Loss of weight 11/16/2016  . Hematuria 10/12/2016  . Rash and nonspecific skin eruption 04/26/2016  . Wellness examination 10/14/2015  . Rectal bleeding 10/14/2015  . Pain in joint, shoulder region 10/14/2015  . Sleep disorder 10/14/2015  . Contusion of toe of left foot 06/25/2014  . Multiple skin nodules 06/25/2014  . Menopausal state 06/25/2014  . Nose abnormality 01/30/2013  . Myalgia and myositis 01/30/2013  . Encounter for long-term (current) use of other medications 01/04/2012  . Uterine cancer ([place]) 01/04/2012  . POSTSURGICAL HYPOTHYROIDISM 09/19/2010  . BARIATRIC SURGERY STATUS 07/07/2010  . INGROWN TOENAIL 04/07/2010  . THYROID NODULE 03/09/2010  . FEVER, HX OF 03/09/2010  . HEADACHE 02/06/2010  . PPD positive 02/06/2010  . FEVER UNSPECIFIED 10/20/2009  . ABDOMINAL PAIN, CHRONIC 10/20/2009  . PYURIA 10/20/2009  . ACUTE CYSTITIS 08/17/2009  . EDEMA 05/20/2009  . WEIGHT GAIN 05/20/2009  . DYSPNEA 05/20/2009  . LEUKOPENIA, MILD 09/29/2008  . ASYMPTOMATIC POSTMENOPAUSAL STATUS 09/29/2008  . EOSINOPHILIC ESOPHAGITIS 05/11/1600  . OBESITY 01/06/2008  .  NASH (nonalcoholic steatohepatitis) 01/06/2008  . Primary osteoarthritis of both hands 01/06/2008  . HEPATOMEGALY 01/06/2008  . Depression 11/21/2007  . Neuropathy 11/21/2007  . SINUSITIS- ACUTE-NOS 11/21/2007   . ALLERGIC RHINITIS 11/21/2007  . Diabetes ([place]) 07/08/2007  . HYPERLIPIDEMIA 07/08/2007  . Essential hypertension 07/08/2007    Past Medical History:  Diagnosis Date  . ABDOMINAL PAIN, CHRONIC 10/20/2009  . Acute cystitis 08/17/2009  . ALLERGIC RHINITIS 11/21/2007  . Allergy   . ASYMPTOMATIC POSTMENOPAUSAL STATUS 09/29/2008  . Bariatric surgery status 07/07/2010  . Cancer ([place])    uterine  . Cataract   . DEPRESSION 11/21/2007  . DIABETES MELLITUS, TYPE II 07/08/2007  . DYSPNEA 05/20/2009  . Edema 05/20/2009  . Eosinophilic esophagitis 04/20/4853  . FEVER UNSPECIFIED 10/20/2009  . FEVER, HX OF 03/09/2010  . GERD (gastroesophageal reflux disease)   . Headache(784.0) 02/06/2010  . Hepatomegaly 01/06/2008  . HYPERLIPIDEMIA 07/08/2007  . HYPERTENSION 07/08/2007  . LEUKOPENIA, MILD 09/29/2008  . OBESITY 01/06/2008  . OSTEOARTHRITIS 01/06/2008  . Other chronic nonalcoholic liver disease 05/08/349  . PERIPHERAL NEUROPATHY 11/21/2007  . Peripheral neuropathy   . Postsurgical hypothyroidism 09/19/2010  . SINUSITIS- ACUTE-NOS 11/21/2007  . TB SKIN TEST, POSITIVE 02/06/2010  . THYROID NODULE 03/09/2010    Family History  Problem Relation Age of Onset  . Multiple sclerosis Sister   . Cancer Sister        breast   . Diabetes Mother   . Diabetes Father   . Liver cancer Maternal Aunt        2 aunt  . Colon cancer Maternal Uncle    Past Surgical History:  Procedure Laterality Date  . ABDOMINAL HYSTERECTOMY    . BASAL CELL CARCINOMA EXCISION    . COLONOSCOPY    . EYE SURGERY Bilateral 08/2018, 09/2018  . KNEE ARTHROSCOPY Left   . OOPHORECTOMY    . Right total knee replacement    . ROUX-EN-Y GASTRIC BYPASS  2011  . SKIN TAG REMOVAL    . THYROIDECTOMY    . TONSILLECTOMY AND ADENOIDECTOMY    . UPPER GASTROINTESTINAL ENDOSCOPY     Social History   Social History Narrative  . Not on file    Objective: Vital Signs: BP 112/66 (BP Location: Left Arm, Patient Position: Sitting, Cuff Size: Normal)    Pulse 87   Resp 14   Ht 5\' 11"  (1.803 m)   Wt 176 lb 12.8 oz (80.2 kg)   BMI 24.66 kg/m    Physical Exam  Constitutional: She is oriented to person, place, and time. She appears well-developed and well-nourished.  HENT:  Head: Normocephalic and atraumatic.  Eyes: Conjunctivae and EOM are normal.  Neck: Normal range of motion.  Cardiovascular: Normal rate, regular rhythm, normal heart sounds and intact distal pulses.  Pulmonary/Chest: Effort normal and breath sounds normal.  Abdominal: Soft. Bowel sounds are normal.  Lymphadenopathy:    She has no cervical adenopathy.  Neurological: She is alert and oriented to person, place, and time.  Skin: Skin is warm and dry. Capillary refill takes less than 2 seconds.  Psychiatric: She has a normal mood and affect. Her behavior is normal.  Nursing note and vitals reviewed.    Musculoskeletal Exam: C-spine thoracic lumbar spine good range of motion.  Shoulder joints elbow joints wrist joints with good range of motion.  She has DIP and PIP thickening in her hands but no synovitis.  Hip joints are in good range of motion.  She has warmth and swelling  in her right knee joint which is replaced.  Left knee joint was in good range of motion.  She has some tenderness across MTPs and her feet consistent with osteoarthritis.  CDAI Exam: CDAI Score: Not documented Patient Global Assessment: Not documented; Provider Global Assessment: Not documented Swollen: Not documented; Tender: Not documented Joint Exam   Not documented   There is currently no information documented on the homunculus. Go to the Rheumatology activity and complete the homunculus joint exam.  Investigation: No additional findings.  Imaging: No results found.  Recent Labs: Lab Results  Component Value Date   WBC 4.2 07/04/2018   HGB 14.2 07/04/2018   PLT 178.0 07/04/2018   NA 137 07/04/2018   K 4.1 07/04/2018   CL 102 07/04/2018   CO2 28 07/04/2018   GLUCOSE 107 (H)  07/04/2018   BUN 19 07/04/2018   CREATININE 0.97 07/04/2018   BILITOT 0.4 03/21/2018   ALKPHOS 79 01/11/2017   AST 19 03/21/2018   ALT 23 03/21/2018   PROT 6.3 03/21/2018   PROT 6.7 03/21/2018   ALBUMIN 3.9 01/11/2017   CALCIUM 9.6 07/04/2018   GFRAA 73 03/21/2018    Speciality Comments: No specialty comments available.  Procedures:  No procedures performed Allergies: Ace inhibitors; Caffeine-sodium benzoate; Ciprofloxacin; and Nsaids   Assessment / Plan:     Visit Diagnoses: Inflammatory arthritis - possibly CPPD-patient takes colchicine which helps with inflammation and discomfort.  Patient states when she does not take colchicine she starts having increased pain.  Primary osteoarthritis of both hands-she has DIP and PIP thickening but no synovitis on examination today.  Joint protection muscle strengthening was discussed.  She has been also using diclofenac gel and taking natural anti-inflammatories.  Primary osteoarthritis of left knee- she has some discomfort in her knee joint.  Primary osteoarthritis of both feet-patient states that she was recently seen by podiatrist for plantar fasciitis which is improved now.  She continues to have some discomfort in her feet due to underlying osteoarthritis.  Proper fitting shoes were discussed.  Status post total right knee replacement-she has some warmth and swelling in her knee joint.  She continues to have discomfort in her replaced knee.  History of gastroesophageal reflux (GERD)-avoidance of oral NSAIDs was discussed.  Other medical problems are listed as follows:-  Eosinophilic esophagitis  NASH (nonalcoholic steatohepatitis)  Positive PPD  Status post gastric bypass for obesity  History of hyperlipidemia  Postsurgical hypothyroidism  History of uterine cancer  History of diabetes mellitus, type II  History of peripheral neuropathy   Orders: No orders of the defined types were placed in this encounter.  No  orders of the defined types were placed in this encounter.     Follow-Up Instructions: Return in about 6 months (around 04/18/2019) for Osteoarthritis.   Bo Merino, MD  Note - This record has been created using Editor, commissioning.  Chart creation errors have been sought, but may not always  have been located. Such creation errors do not reflect on  the standard of medical care.

## 2018-10-06 ENCOUNTER — Other Ambulatory Visit: Payer: Self-pay | Admitting: Endocrinology

## 2018-10-17 ENCOUNTER — Ambulatory Visit (INDEPENDENT_AMBULATORY_CARE_PROVIDER_SITE_OTHER): Payer: 59 | Admitting: Rheumatology

## 2018-10-17 ENCOUNTER — Encounter: Payer: Self-pay | Admitting: Rheumatology

## 2018-10-17 VITALS — BP 112/66 | HR 87 | Resp 14 | Ht 71.0 in | Wt 176.8 lb

## 2018-10-17 DIAGNOSIS — K200 Eosinophilic esophagitis: Secondary | ICD-10-CM

## 2018-10-17 DIAGNOSIS — Z96651 Presence of right artificial knee joint: Secondary | ICD-10-CM

## 2018-10-17 DIAGNOSIS — M19042 Primary osteoarthritis, left hand: Secondary | ICD-10-CM

## 2018-10-17 DIAGNOSIS — M19041 Primary osteoarthritis, right hand: Secondary | ICD-10-CM

## 2018-10-17 DIAGNOSIS — Z8669 Personal history of other diseases of the nervous system and sense organs: Secondary | ICD-10-CM

## 2018-10-17 DIAGNOSIS — M1712 Unilateral primary osteoarthritis, left knee: Secondary | ICD-10-CM | POA: Diagnosis not present

## 2018-10-17 DIAGNOSIS — Z8719 Personal history of other diseases of the digestive system: Secondary | ICD-10-CM

## 2018-10-17 DIAGNOSIS — M19071 Primary osteoarthritis, right ankle and foot: Secondary | ICD-10-CM

## 2018-10-17 DIAGNOSIS — K7581 Nonalcoholic steatohepatitis (NASH): Secondary | ICD-10-CM

## 2018-10-17 DIAGNOSIS — Z8542 Personal history of malignant neoplasm of other parts of uterus: Secondary | ICD-10-CM

## 2018-10-17 DIAGNOSIS — Z9884 Bariatric surgery status: Secondary | ICD-10-CM

## 2018-10-17 DIAGNOSIS — M19072 Primary osteoarthritis, left ankle and foot: Secondary | ICD-10-CM

## 2018-10-17 DIAGNOSIS — R7611 Nonspecific reaction to tuberculin skin test without active tuberculosis: Secondary | ICD-10-CM

## 2018-10-17 DIAGNOSIS — E890 Postprocedural hypothyroidism: Secondary | ICD-10-CM

## 2018-10-17 DIAGNOSIS — M1990 Unspecified osteoarthritis, unspecified site: Secondary | ICD-10-CM

## 2018-10-17 DIAGNOSIS — Z8639 Personal history of other endocrine, nutritional and metabolic disease: Secondary | ICD-10-CM

## 2018-10-28 ENCOUNTER — Other Ambulatory Visit: Payer: Self-pay | Admitting: Endocrinology

## 2018-10-28 ENCOUNTER — Telehealth (HOSPITAL_COMMUNITY): Payer: Self-pay | Admitting: Emergency Medicine

## 2018-10-28 ENCOUNTER — Other Ambulatory Visit: Payer: Self-pay

## 2018-10-28 DIAGNOSIS — R079 Chest pain, unspecified: Secondary | ICD-10-CM

## 2018-10-28 NOTE — Telephone Encounter (Signed)
error 

## 2018-10-28 NOTE — Telephone Encounter (Signed)
Reaching out to patient to offer assistance, answer question regarding upcoming cardiac imaging study; pt verbalized understanding of appt time, when to arrive, where to arrive, parking situations, metoprolol adminstration, NPO status before scan, and use of metformin post-scan; name and call back number offered for further questions Marchia Bond RN Navigator Cardiac Imaging [phone]

## 2018-10-29 LAB — BASIC METABOLIC PANEL
BUN/Creatinine Ratio: 14 (ref 12–28)
BUN: 12 mg/dL (ref 8–27)
CO2: 25 mmol/L (ref 20–29)
Calcium: 9 mg/dL (ref 8.7–10.3)
Chloride: 105 mmol/L (ref 96–106)
Creatinine, Ser: 0.85 mg/dL (ref 0.57–1.00)
GFR calc Af Amer: 84 mL/min/{1.73_m2} (ref >59)
GFR calc non Af Amer: 73 mL/min/{1.73_m2} (ref >59)
Glucose: 102 mg/dL — ABNORMAL HIGH (ref 65–99)
Potassium: 4.1 mmol/L (ref 3.5–5.2)
Sodium: 145 mmol/L — ABNORMAL HIGH (ref 134–144)

## 2018-10-30 ENCOUNTER — Ambulatory Visit (HOSPITAL_COMMUNITY)
Admission: RE | Admit: 2018-10-30 | Discharge: 2018-10-30 | Disposition: A | Discharge status: Home / Self Care | Payer: 59 | Source: Ambulatory Visit | Attending: Internal Medicine | Admitting: Internal Medicine

## 2018-10-30 ENCOUNTER — Encounter: Payer: 59 | Admitting: *Deleted

## 2018-10-30 DIAGNOSIS — R079 Chest pain, unspecified: Secondary | ICD-10-CM

## 2018-10-30 DIAGNOSIS — Z006 Encounter for examination for normal comparison and control in clinical research program: Secondary | ICD-10-CM

## 2018-10-30 MED ORDER — NITROGLYCERIN 0.4 MG SL SUBL
SUBLINGUAL_TABLET | SUBLINGUAL | Status: AC
  Administered 2018-10-30: 0.8 mg via SUBLINGUAL
  Filled 2018-10-30: qty 2

## 2018-10-30 MED ORDER — NITROGLYCERIN 0.4 MG SL SUBL
0.8000 mg | SUBLINGUAL_TABLET | Freq: Once | SUBLINGUAL | Status: AC
  Administered 2018-10-30: 0.8 mg via SUBLINGUAL
  Filled 2018-10-30: qty 25

## 2018-10-30 MED ORDER — IOPAMIDOL (ISOVUE-370) INJECTION 76%
80.0000 mL | Freq: Once | INTRAVENOUS | Status: AC | PRN
  Administered 2018-10-30: 80 mL via INTRAVENOUS

## 2018-10-31 ENCOUNTER — Telehealth: Payer: Self-pay

## 2018-10-31 NOTE — Telephone Encounter (Signed)
Pt aware of Cor CTA results. Pt sts that she is flying to [place] to spend time with family over the holiday and will return after 11/10/18. F/u appt sch with Dr.Acharya first available appt. 11/28/18 @ 8am. Pt sts that she is currently asymptomatic. Adv pt to contact the office if symptoms develop.  Adv pt that Dr.Acharya would like her to start Asa 81mg  daily. Pt sts that she has a hx of gastric bypass and was told she should not take NSAIDs. Adv pt that I will update Dr.A  Pt verbalized understanding.

## 2018-10-31 NOTE — Telephone Encounter (Signed)
-----   Message from Elouise Munroe, MD sent at 10/30/2018  5:07 PM EST ----- Please have the patient come in for my next available appt to discuss coronary angiogram.  Severe CAD seen on CT.

## 2018-11-16 ENCOUNTER — Other Ambulatory Visit: Payer: Self-pay | Admitting: Endocrinology

## 2018-11-17 NOTE — Telephone Encounter (Signed)
Please refill x 3 months Further refills would have to be considered by new PCP   

## 2018-11-18 DIAGNOSIS — H1131 Conjunctival hemorrhage, right eye: Secondary | ICD-10-CM | POA: Diagnosis not present

## 2018-11-18 DIAGNOSIS — H04123 Dry eye syndrome of bilateral lacrimal glands: Secondary | ICD-10-CM | POA: Diagnosis not present

## 2018-11-21 ENCOUNTER — Encounter: Payer: Self-pay | Admitting: Family Medicine

## 2018-11-21 ENCOUNTER — Ambulatory Visit: Payer: 59 | Admitting: Family Medicine

## 2018-11-21 VITALS — BP 110/68 | HR 71 | Temp 98.0°F | Ht 71.0 in | Wt 173.2 lb

## 2018-11-21 DIAGNOSIS — C55 Malignant neoplasm of uterus, part unspecified: Secondary | ICD-10-CM | POA: Diagnosis not present

## 2018-11-21 DIAGNOSIS — E785 Hyperlipidemia, unspecified: Secondary | ICD-10-CM

## 2018-11-21 DIAGNOSIS — J309 Allergic rhinitis, unspecified: Secondary | ICD-10-CM

## 2018-11-21 DIAGNOSIS — D692 Other nonthrombocytopenic purpura: Secondary | ICD-10-CM | POA: Diagnosis not present

## 2018-11-21 DIAGNOSIS — E1149 Type 2 diabetes mellitus with other diabetic neurological complication: Secondary | ICD-10-CM

## 2018-11-21 DIAGNOSIS — K2100 Gastro-esophageal reflux disease with esophagitis, without bleeding: Secondary | ICD-10-CM

## 2018-11-21 DIAGNOSIS — E890 Postprocedural hypothyroidism: Secondary | ICD-10-CM

## 2018-11-21 DIAGNOSIS — M1189 Other specified crystal arthropathies, multiple sites: Secondary | ICD-10-CM

## 2018-11-21 DIAGNOSIS — E1169 Type 2 diabetes mellitus with other specified complication: Secondary | ICD-10-CM

## 2018-11-21 DIAGNOSIS — E1140 Type 2 diabetes mellitus with diabetic neuropathy, unspecified: Secondary | ICD-10-CM

## 2018-11-21 DIAGNOSIS — Z794 Long term (current) use of insulin: Secondary | ICD-10-CM

## 2018-11-21 LAB — CBC
HCT: 41.7 % (ref 36.0–46.0)
Hemoglobin: 13.6 g/dL (ref 12.0–15.0)
MCHC: 32.7 g/dL (ref 30.0–36.0)
MCV: 96.1 fl (ref 78.0–100.0)
Platelets: 182 10*3/uL (ref 150.0–400.0)
RBC: 4.33 Mil/uL (ref 3.87–5.11)
RDW: 14.1 % (ref 11.5–15.5)
WBC: 3.5 10*3/uL — ABNORMAL LOW (ref 4.0–10.5)

## 2018-11-21 LAB — PROTIME-INR
INR: 0.9 ratio (ref 0.8–1.0)
Prothrombin Time: 11 s (ref 9.6–13.1)

## 2018-11-21 MED ORDER — HYDROCODONE-ACETAMINOPHEN 5-325 MG PO TABS: 1.0000 | ORAL_TABLET | Freq: Four times a day (QID) | ORAL | 0 refills | Status: DC | PRN

## 2018-11-21 MED ORDER — HYDROCODONE-ACETAMINOPHEN 5-325 MG PO TABS: 1.0000 | ORAL_TABLET | Freq: Four times a day (QID) | ORAL | 0 refills | Status: AC | PRN

## 2018-11-21 NOTE — Assessment & Plan Note (Signed)
Reassured patient.  Check INR, CBC, and platelet function test.  Advised her to follow-up with dermatology to see if they have any further recommendations to reduce appearance.

## 2018-11-21 NOTE — Progress Notes (Signed)
   Subjective:  Savannah [name] is a 63 y.o. female who presents today with a chief complaint of skin discoloration.   HPI:  Skin Discoloration, new problem Started several years ago.  Has noticed more predominantly lately.  She has several areas on her skin that become easily bruised.  She has not tried anything for this.  She is concerned about possible platelet disorder or clotting disorder.  No history of easy bleeding.  She has several other chronic, stable medical conditions outlined below  #Type 2 diabetes with diabetic neuropathy -Managed by endocrinology. -On metformin 1000 mg twice daily, Prandin 2 mg 3 times daily, Invokana 300 mg daily, and Trulicity 1.5 mg weekly -On gabapentin 600 mg twice daily.  #Dyslipidemia -On Lipitor 40 mg daily.  Tolerating well.  #GERD/esophageal strictures -Follows with GI -On Protonix 40 mg daily and tolerating well.  #Pseudogout/osteoarthritis - Follows with rheumatology -On colchicine and doing well.  Occasionally takes tramadol and very rarely takes hydrocodone for severe pain.  #Lichen sclerosis -Follows with dermatology -Has several topical steroid creams that helped modestly  #Hypothyroidism -On Synthroid 125 mcg daily and tolerating well  #Allergic rhinitis -On Claritin/Xyzal and Flonase.  #History of uterine cancer status post radical hysterectomy -Follows with gynecology  ROS: Per HPI  PMH: She reports that she has quit smoking. Her smoking use included cigarettes. She has a 22.50 pack-year smoking history. She has never used smokeless tobacco. She reports current alcohol use. She reports that she does not use drugs.  Objective:  Physical Exam: BP 110/68 (BP Location: Left Arm, Patient Position: Sitting, Cuff Size: Normal)   Pulse 71   Temp 98 F (36.7 C) (Oral)   Ht 5\' 11"  (1.803 m)   Wt 173 lb 4 oz (78.6 kg)   SpO2 99%   BMI 24.16 kg/m   Gen: NAD, resting comfortably CV: RRR with no murmurs appreciated Pulm:  NWOB, CTAB with no crackles, wheezes, or rhonchi GI: Normal bowel sounds present. Soft, Nontender, Nondistended. MSK: No edema, cyanosis, or clubbing noted Skin: Warm, dry.  Several scattered ecchymoses on upper extremities. Neuro: Grossly normal, moves all extremities Psych: Normal affect and thought content  Assessment/Plan:  Senile purpura ([place]) Reassured patient.  Check INR, CBC, and platelet function test.  Advised her to follow-up with dermatology to see if they have any further recommendations to reduce appearance.  GERD with esophagitis Stable.  Continue Protonix 40 mg daily.  Pseudogout involving multiple joints Continue colchicine per rheumatology.  Database reviewed without red flags.  Will refill hydrocodone today.  Controlled substance contract reviewed, signed, and scanned in chart.  Type 2 diabetes mellitus with diabetic neuropathy, unspecified (HCC) Continue metformin, Prandin, Invokana, and Trulicity per endocrine.  Diabetic neuropathy (HCC) Continue gabapentin 600 mg twice daily.  Uterine cancer [facility]) s/p hysterectomy 1997 Management per gynecology.  Will obtain records regarding most recent Pap and mammogram.  Dyslipidemia associated with type 2 diabetes mellitus ([place]) Stable.  Continue statin.  Allergic rhinitis Stable.  Continue over-the-counter antihistamines and intranasal steroids.  Postsurgical hypothyroidism Stable.  Continue Synthroid 125 mcg daily.  Time Spent: I spent >40 minutes face-to-face with the patient, with more than half spent on coordinating care and counseling for management plan for senile purpura, GERD, pseudogout, type 2 diabetes, diabetic neuropathy, dyslipidemia, allergic rhinitis, and hypothyroidism.   Algis Greenhouse. Jerline Pain, MD 11/21/2018 12:12 PM

## 2018-11-21 NOTE — Assessment & Plan Note (Signed)
Continue colchicine per rheumatology.  Database reviewed without red flags.  Will refill hydrocodone today.  Controlled substance contract reviewed, signed, and scanned in chart.

## 2018-11-21 NOTE — Assessment & Plan Note (Signed)
Continue gabapentin 600 mg twice daily.

## 2018-11-21 NOTE — Assessment & Plan Note (Signed)
Stable.  Continue Synthroid 125 mcg daily.

## 2018-11-21 NOTE — Assessment & Plan Note (Signed)
Stable.  Continue statin.

## 2018-11-21 NOTE — Assessment & Plan Note (Signed)
Management per gynecology.  Will obtain records regarding most recent Pap and mammogram.

## 2018-11-21 NOTE — Assessment & Plan Note (Signed)
Stable.  Continue Protonix 40 mg daily

## 2018-11-21 NOTE — Assessment & Plan Note (Signed)
Continue metformin, Prandin, Invokana, and Trulicity per endocrine.

## 2018-11-21 NOTE — Assessment & Plan Note (Signed)
Stable.  Continue over-the-counter antihistamines and intranasal steroids.

## 2018-11-21 NOTE — Patient Instructions (Signed)
It was very nice to see you today!  We will check blood work.  No other changes today.  Please come back in a year or so for your next visit, or sooner as needed.   Take care, Dr Jerline Pain

## 2018-11-22 ENCOUNTER — Other Ambulatory Visit: Payer: Self-pay | Admitting: Endocrinology

## 2018-11-22 LAB — TIQ- AMBIGUOUS ORDER

## 2018-11-22 NOTE — Telephone Encounter (Signed)
Please refer request to new PCP  

## 2018-11-24 ENCOUNTER — Other Ambulatory Visit: Payer: Self-pay | Admitting: Endocrinology

## 2018-11-24 ENCOUNTER — Other Ambulatory Visit: Payer: Self-pay | Admitting: Family Medicine

## 2018-11-24 MED ORDER — PANTOPRAZOLE SODIUM 40 MG PO TBEC: 40.0000 mg | DELAYED_RELEASE_TABLET | Freq: Every day | ORAL | 0 refills | Status: DC

## 2018-11-24 NOTE — Telephone Encounter (Signed)
See note

## 2018-11-24 NOTE — Telephone Encounter (Signed)
Copied from [place] [phone]. Topic: Quick Communication - Rx Refill/Question >> Nov 24, 2018  1:50 PM Waylan Rocher, Lumin L wrote: Medication: pantoprazole (PROTONIX) 40 MG tablet (needs to be picked up by Jerline Pain since Dr. Loanne Drilling is no longer her PCP)  Has the patient contacted their pharmacy? Yes.   (Agent: If no, request that the patient contact the pharmacy for the refill.) (Agent: If yes, when and what did the pharmacy advise?)  Preferred Pharmacy (with phone number or street name): [id number] [address] [id number]: [phone]: [phone] Agent: Please be advised that RX refills may take up to 3 business days. We ask that you follow-up with your pharmacy.

## 2018-11-25 ENCOUNTER — Other Ambulatory Visit: Payer: Self-pay

## 2018-11-25 MED ORDER — PANTOPRAZOLE SODIUM 40 MG PO TBEC: 40.0000 mg | DELAYED_RELEASE_TABLET | Freq: Every day | ORAL | 3 refills | Status: DC

## 2018-11-25 NOTE — Progress Notes (Signed)
Please inform patient of the following:  Please let patient know that her INR and platelet counts were NORMAL. The lab we sent her blood to was unable to perform her platelet function test. Would recommend hematology referral if she wishes to have this test done.  Algis Greenhouse. Jerline Pain, MD 11/25/2018 11:14 AM

## 2018-11-25 NOTE — Addendum Note (Signed)
Addended by: Kayren Eaves T on: 11/25/2018 08:53 AM   Modules accepted: Orders

## 2018-11-28 ENCOUNTER — Encounter: Payer: Self-pay | Admitting: Physical Therapy

## 2018-11-28 ENCOUNTER — Ambulatory Visit: Payer: 59 | Admitting: Internal Medicine

## 2018-11-28 ENCOUNTER — Encounter: Payer: Self-pay | Admitting: Internal Medicine

## 2018-11-28 VITALS — BP 108/62 | HR 82 | Ht 71.0 in | Wt 170.0 lb

## 2018-11-28 DIAGNOSIS — R0789 Other chest pain: Secondary | ICD-10-CM | POA: Diagnosis not present

## 2018-11-28 DIAGNOSIS — E1140 Type 2 diabetes mellitus with diabetic neuropathy, unspecified: Secondary | ICD-10-CM

## 2018-11-28 DIAGNOSIS — I2510 Atherosclerotic heart disease of native coronary artery without angina pectoris: Secondary | ICD-10-CM | POA: Diagnosis not present

## 2018-11-28 DIAGNOSIS — E1169 Type 2 diabetes mellitus with other specified complication: Secondary | ICD-10-CM

## 2018-11-28 DIAGNOSIS — E785 Hyperlipidemia, unspecified: Secondary | ICD-10-CM

## 2018-11-28 MED ORDER — NITROGLYCERIN 0.4 MG SL SUBL: 0.4000 mg | SUBLINGUAL_TABLET | SUBLINGUAL | 3 refills | Status: DC | PRN

## 2018-11-28 MED ORDER — METOPROLOL TARTRATE 25 MG PO TABS: 12.5000 mg | ORAL_TABLET | Freq: Two times a day (BID) | ORAL | 3 refills | Status: DC

## 2018-11-28 NOTE — Progress Notes (Signed)
Cardiology Office Note:    Date:  11/28/2018   ID:  Savannah [name], DOB Jun 18, 1956, MRN [id number]  PCP:  Vivi Barrack, MD  Cardiologist:  No primary care provider on file.  Electrophysiologist:  None   Referring MD: No ref. provider found   Review coronary CTA  History of Present Illness:    Savannah [name] is a 63 y.o. female with a hx of atypical chest pain with concern for an exertional component.  Her past medical history is significant for PVCs, esophageal rings with multiple previous dilations, bariatric surgery in 2011 with appropriate postoperative weight loss.  We performed an echocardiogram stress test which had suboptimal stress images.  With that we performed a CT coronary angiogram.  CT coronary angiogram showed severe coronary artery disease with hemodynamic significance by CT FFR in the RCA and OM1, as well as the distal LAD.  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  She has previously endorsed some exertional discomfort in her chest, however those symptoms have been infrequent lately, and she has not had any significant episodes of chest pain with exertion like she has had in the past.  Her chest pain is not associated with deep breathing.  We participated in shared decision making about next steps for coronary artery disease.  She is also concerned about bilateral hand stiffness for which she takes magnesium, occasional dizziness, and profuse sweating episode not related to an episode of chest discomfort or shortness of breath.  The patient denies current dyspnea at rest or with exertion, palpitations, PND, orthopnea, or leg swelling. Denies syncope or presyncope.  Denies fevers, chills, cough.  Denies nausea, vomiting. Past Medical History:  Diagnosis Date  . ABDOMINAL PAIN, CHRONIC 10/20/2009  .  Acute cystitis 08/17/2009  . ALLERGIC RHINITIS 11/21/2007  . Allergy   . ASYMPTOMATIC POSTMENOPAUSAL STATUS 09/29/2008  . Bariatric surgery status 07/07/2010  . Bariatric surgery status 07/07/2010   Qualifier: Diagnosis of  By: Loanne Drilling MD, Jacelyn Pi   . Cancer ([place])    uterine  . Cataract   . DEPRESSION 11/21/2007  . DIABETES MELLITUS, TYPE II 07/08/2007  . DYSPNEA 05/20/2009  . Edema 05/20/2009  . Eosinophilic esophagitis 03/13/7615  . FEVER UNSPECIFIED 10/20/2009  . FEVER, HX OF 03/09/2010  . GERD (gastroesophageal reflux disease)   . Headache(784.0) 02/06/2010  . Hepatomegaly 01/06/2008  . HYPERLIPIDEMIA 07/08/2007  . HYPERTENSION 07/08/2007  . LEUKOPENIA, MILD 09/29/2008  . OBESITY 01/06/2008  . OSTEOARTHRITIS 01/06/2008  . Other chronic nonalcoholic liver disease 0/73/7106  . PERIPHERAL NEUROPATHY 11/21/2007  . Peripheral neuropathy   . Postsurgical hypothyroidism 09/19/2010  . SINUSITIS- ACUTE-NOS 11/21/2007  . TB SKIN TEST, POSITIVE 02/06/2010  . THYROID NODULE 03/09/2010    Past Surgical History:  Procedure Laterality Date  . ABDOMINAL HYSTERECTOMY    . BASAL CELL CARCINOMA EXCISION    . COLONOSCOPY    . EYE SURGERY Bilateral 08/2018, 09/2018  . KNEE ARTHROSCOPY Left   . OOPHORECTOMY    . Right total knee replacement    . ROUX-EN-Y GASTRIC BYPASS  2011  . SKIN TAG REMOVAL    . THYROIDECTOMY    . TONSILLECTOMY AND ADENOIDECTOMY    . UPPER GASTROINTESTINAL ENDOSCOPY      Current Medications: Current Meds  Medication Sig  .  atorvastatin (LIPITOR) 40 MG tablet Take 1 tablet (40 mg total) by mouth daily.  . Cholecalciferol (VITAMIN D3) 400 UNITS tablet Take 400 Units by mouth daily.    . clobetasol cream (TEMOVATE) 0.05 % clobetasol 0.05 % topical cream  APPLY THIN COAT TO AFFECTED AREA TWICE A DAY  . Coenzyme Q10 (CO Q 10 PO) Take by mouth daily.  . diclofenac sodium (VOLTAREN) 1 % GEL Apply 4 g topically 4 (four) times daily.  [name] docusate sodium (COLACE) 250 MG capsule Take 500 mg by  mouth 2 (two) times daily.    . Flaxseed, Linseed, (FLAX SEED OIL PO) Take 1 capsule by mouth daily.    . fluconazole (DIFLUCAN) 150 MG tablet Take 150 mg by mouth as needed.  . gabapentin (NEURONTIN) 300 MG capsule TAKE 2 CAPSULES BY MOUTH TWICE A DAY  . halobetasol (ULTRAVATE) 0.05 % cream APPLY TO AFFECTED AREA TWICE A DAY AS NEEDED  . hydrOXYzine (ATARAX/VISTARIL) 25 MG tablet Take 25 mg by mouth at bedtime.   . INVOKANA 300 MG TABS tablet TAKE 1 TABLET EVERY DAY BEFORE BREAKFAST  . levocetirizine (XYZAL) 5 MG tablet Take 5 mg by mouth every evening.  [name] levothyroxine (SYNTHROID, LEVOTHROID) 125 MCG tablet TAKE 1 TABLET (125 MCG TOTAL) BY MOUTH DAILY BEFORE BREAKFAST.  [name] loratadine (CLARITIN) 10 MG tablet Take 10 mg by mouth daily.  [name] MAGNESIUM PO Take by mouth as needed.  . metFORMIN (GLUCOPHAGE-XR) 500 MG 24 hr tablet Take 2 tablets (1,000 mg total) by mouth 2 (two) times daily.  [name] MITIGARE 0.6 MG CAPS TAKE 1 CAPSULE BY MOUTH TWICE A DAY  . Multiple Vitamin (MULTIVITAMIN) tablet Take 1 tablet by mouth 2 (two) times daily.   . Multiple Vitamins-Minerals (OCUVITE PO) Take by mouth daily.  . ONE TOUCH ULTRA TEST test strip USE TO CHECK BLOOD SUGAR 1 TIME PER DAY.  . pantoprazole (PROTONIX) 40 MG tablet Take 1 tablet (40 mg total) by mouth daily.  [name] PREBIOTIC PRODUCT PO Take by mouth daily.  . Probiotic Product (PROBIOTIC PO) Take by mouth daily.  . repaglinide (PRANDIN) 2 MG tablet TAKE 1 TABLET BY MOUTH 3 TIMES A DAY BEFORE MEALS  . traMADol (ULTRAM) 50 MG tablet Take 1 tablet (50 mg total) by mouth every 6 (six) hours as needed.  . TRULICITY 1.5 WV/1.4CJ SOPN INJECT 1.5 MG INTO THE SKIN ONCE A WEEK.  . TURMERIC PO Take by mouth daily.     Allergies:   Ace inhibitors; Caffeine-sodium benzoate; Ciprofloxacin; and Nsaids   Social History   Socioeconomic History  . Marital status: Single    Spouse name: Not on file  . Number of children: 0  . Years of education: Not on file  .  Highest education level: Not on file  Occupational History  . Occupation: Optometrist  Social Needs  . Financial resource strain: Not on file  . Food insecurity:    Worry: Not on file    Inability: Not on file  . Transportation needs:    Medical: Not on file    Non-medical: Not on file  Tobacco Use  . Smoking status: Former Smoker    Packs/day: 1.50    Years: 15.00    Pack years: 22.50    Types: Cigarettes  . Smokeless tobacco: Never Used  Substance and Sexual Activity  . Alcohol use: Yes    Alcohol/week: 0.0 standard drinks    Comment: rarely  . Drug use: Never  . Sexual activity: Not  on file  Lifestyle  . Physical activity:    Days per week: Not on file    Minutes per session: Not on file  . Stress: Not on file  Relationships  . Social connections:    Talks on phone: Not on file    Gets together: Not on file    Attends religious service: Not on file    Active member of club or organization: Not on file    Attends meetings of clubs or organizations: Not on file    Relationship status: Not on file  Other Topics Concern  . Not on file  Social History Narrative  . Not on file     Family History: The patient's family history includes Cancer in her sister; Colon cancer in her maternal uncle; Diabetes in her father and mother; Liver cancer in her maternal aunt; Multiple sclerosis in her sister.  ROS:   Please see the history of present illness.    All other systems reviewed and are negative.  EKGs/Labs/Other Studies Reviewed:    The following studies were reviewed today:  EKG:  NSR, borderline LAE. Rate 82 bpm.  Recent Labs: 03/21/2018: ALT 23; Magnesium 2.2; TSH 0.45 10/28/2018: BUN 12; Creatinine, Ser 0.85; Potassium 4.1; Sodium 145 11/21/2018: Hemoglobin 13.6; Platelets 182.0  Recent Lipid Panel    Component Value Date/Time   CHOL 166 07/04/2018 1403   TRIG 123.0 07/04/2018 1403   HDL 45.80 07/04/2018 1403   CHOLHDL 4 07/04/2018 1403   VLDL 24.6  07/04/2018 1403   LDLCALC 95 07/04/2018 1403   LDLDIRECT 99.0 10/12/2011 1219    Physical Exam:    VS:  BP 108/62   Pulse 82   Ht 5' 11"  (1.803 m)   Wt 170 lb (77.1 kg)   BMI 23.71 kg/m     Wt Readings from Last 3 Encounters:  11/28/18 170 lb (77.1 kg)  11/21/18 173 lb 4 oz (78.6 kg)  10/17/18 176 lb 12.8 oz (80.2 kg)     Constitutional: No acute distress Eyes: pupils equally round and reactive to light, sclera non-icteric, normal conjunctiva and lids ENMT: normal dentition, moist mucous membranes Cardiovascular: regular rhythm, normal rate, no murmurs. S1 and S2 normal. Radial pulses normal bilaterally. No jugular venous distention.  Respiratory: clear to auscultation bilaterally GI : normal bowel sounds, soft and nontender. No distention.   MSK: extremities warm, well perfused. No edema.  NEURO: grossly nonfocal exam, moves all extremities. PSYCH: alert and oriented x 3, normal mood and affect.   ASSESSMENT:    1. Coronary artery disease involving native coronary artery of native heart without angina pectoris   2. Type 2 diabetes mellitus with diabetic neuropathy, unspecified whether long term insulin use ([place])   3. Dyslipidemia associated with type 2 diabetes mellitus ([place])   4. Atypical chest pain    PLAN:    We have participated in shared decision making today.  I have inquired as to whether she feels her symptoms are concerning still, and she says yes.  We will plan for coronary angiography.  However she has travel planned in the near future, and would like to wait until after she returns to arrange a coronary angiogram.  Her symptoms are not concerning for progressive or accelerating angina.  I would like to begin medical management for coronary artery disease prior to her coronary angiogram which will include metoprolol 12.5 mg twice daily.  I will also give her a prescription for nitroglycerin sublingual to take when she has  chest discomfort.  She has an extended trip  to [place] planned and feels comfortable traveling since she had a long trip to [place] recently with no difficulties.  I have discouraged her from extreme exertion or anything that provokes her chest pain, and she demonstrated understanding.  She has an intolerance to aspirin and that she has been discouraged from taking NSAIDs for a history of gastric bypass.  She is hesitant to take aspirin, and we will need to discuss this with our colleagues in interventional cardiology prior to stenting for coronary artery disease.  Her current cardiovascular medication regimen after this visit includes atorvastatin 40 mg daily and metoprolol tartrate 12.5 mg twice daily.  Sublingual nitro as needed.  She has been consented for coronary angiogram today as noted below, however we will see her in 1 month to ensure no changes in her health have occurred.  Informed consent: I have reviewed the risks, indications, and alternatives to cardiac catheterization, possible angioplasty, and stenting with the patient. Risks include but are not limited to bleeding, infection, vascular injury, stroke, myocardial infection, arrhythmia, kidney injury, radiation-related injury in the case of prolonged fluoroscopy use, emergency cardiac surgery, and death. The patient understands the risks of serious complication is 1-2 in 5852 with diagnostic cardiac cath and 1-2% or less with angioplasty/stenting.    Medication Adjustments/Labs and Tests Ordered: Current medicines are reviewed at length with the patient today.  Concerns regarding medicines are outlined above.  Orders Placed This Encounter  Procedures  . EKG 12-Lead   Meds ordered this encounter  Medications  . metoprolol tartrate (LOPRESSOR) 25 MG tablet    Sig: Take 0.5 tablets (12.5 mg total) by mouth 2 (two) times daily.    Dispense:  90 tablet    Refill:  3  . nitroGLYCERIN (NITROSTAT) 0.4 MG SL tablet    Sig: Place 1 tablet (0.4 mg total) under the tongue every 5  (five) minutes as needed.    Dispense:  25 tablet    Refill:  3    Patient Instructions  Medication Instructions:  Your physician has recommended you make the following change in your medication:   1) START Metoprolol 12.29m twice daily. An Rx has been sent to your pharmacy 2) START Nitro-Glycerin use as directed. An Rx has been sent to your pharmacy  If you need a refill on your cardiac medications before your next appointment, please call your pharmacy.   Lab work: None ordered If you have labs (blood work) drawn today and your tests are completely normal, you will receive your results only by: .Marland KitchenMyChart Message (if you have MyChart) OR . A paper copy in the mail If you have any lab test that is abnormal or we need to change your treatment, we will call you to review the results.  Testing/Procedures: None ordered  Follow-Up: [facility] and your health needs are our priority.  As part of our continuing mission to provide you with exceptional heart care, we have created designated Provider Care Teams.  These Care Teams include your primary Cardiologist (physician) and Advanced Practice Providers (APPs -  Physician Assistants and Nurse Practitioners) who all work together to provide you with the care you need, when you need it.  Your appointment with Dr.Rachana Malesky is scheduled for 12/31/18 @ 9:40am  Any Other Special Instructions Will Be Listed Below (If Applicable).  Nitroglycerin sublingual tablets What is this medicine? NITROGLYCERIN (nye troe GLI ser in) is a type of vasodilator. It relaxes  blood vessels, increasing the blood and oxygen supply to your heart. This medicine is used to relieve chest pain caused by angina. It is also used to prevent chest pain before activities like climbing stairs, going outdoors in cold weather, or sexual activity. This medicine may be used for other purposes; ask your health care provider or pharmacist if you have questions. COMMON BRAND  NAME(S): Nitroquick, Nitrostat, Nitrotab What should I tell my health care provider before I take this medicine? They need to know if you have any of these conditions: -anemia -head injury, recent stroke, or bleeding in the brain -liver disease -previous heart attack -an unusual or allergic reaction to nitroglycerin, other medicines, foods, dyes, or preservatives -pregnant or trying to get pregnant -breast-feeding How should I use this medicine? Take this medicine by mouth as needed. At the first sign of an angina attack (chest pain or tightness) place one tablet under your tongue. You can also take this medicine 5 to 10 minutes before an event likely to produce chest pain. Follow the directions on the prescription label. Let the tablet dissolve under the tongue. Do not swallow whole. Replace the dose if you accidentally swallow it. It will help if your mouth is not dry. Saliva around the tablet will help it to dissolve more quickly. Do not eat or drink, smoke or chew tobacco while a tablet is dissolving. If you are not better within 5 minutes after taking ONE dose of nitroglycerin, call 9-1-1 immediately to seek emergency medical care. Do not take more than 3 nitroglycerin tablets over 15 minutes. If you take this medicine often to relieve symptoms of angina, your doctor or health care professional may provide you with different instructions to manage your symptoms. If symptoms do not go away after following these instructions, it is important to call 9-1-1 immediately. Do not take more than 3 nitroglycerin tablets over 15 minutes. Talk to your pediatrician regarding the use of this medicine in children. Special care may be needed. Overdosage: If you think you have taken too much of this medicine contact a poison control center or emergency room at once. NOTE: This medicine is only for you. Do not share this medicine with others. What if I miss a dose? This does not apply. This medicine is only  used as needed. What may interact with this medicine? Do not take this medicine with any of the following medications: -certain migraine medicines like ergotamine and dihydroergotamine (DHE) -medicines used to treat erectile dysfunction like sildenafil, tadalafil, and vardenafil -riociguat This medicine may also interact with the following medications: -alteplase -aspirin -heparin -medicines for high blood pressure -medicines for mental depression -other medicines used to treat angina -phenothiazines like chlorpromazine, mesoridazine, prochlorperazine, thioridazine This list may not describe all possible interactions. Give your health care provider a list of all the medicines, herbs, non-prescription drugs, or dietary supplements you use. Also tell them if you smoke, drink alcohol, or use illegal drugs. Some items may interact with your medicine. What should I watch for while using this medicine? Tell your doctor or health care professional if you feel your medicine is no longer working. Keep this medicine with you at all times. Sit or lie down when you take your medicine to prevent falling if you feel dizzy or faint after using it. Try to remain calm. This will help you to feel better faster. If you feel dizzy, take several deep breaths and lie down with your feet propped up, or bend forward with your head  resting between your knees. You may get drowsy or dizzy. Do not drive, use machinery, or do anything that needs mental alertness until you know how this drug affects you. Do not stand or sit up quickly, especially if you are an older patient. This reduces the risk of dizzy or fainting spells. Alcohol can make you more drowsy and dizzy. Avoid alcoholic drinks. Do not treat yourself for coughs, colds, or pain while you are taking this medicine without asking your doctor or health care professional for advice. Some ingredients may increase your blood pressure. What side effects may I notice from  receiving this medicine? Side effects that you should report to your doctor or health care professional as soon as possible: -blurred vision -dry mouth -skin rash -sweating -the feeling of extreme pressure in the head -unusually weak or tired Side effects that usually do not require medical attention (report to your doctor or health care professional if they continue or are bothersome): -flushing of the face or neck -headache -irregular heartbeat, palpitations -nausea, vomiting This list may not describe all possible side effects. Call your doctor for medical advice about side effects. You may report side effects to FDA at 1-800-FDA-1088. Where should I keep my medicine? Keep out of the reach of children. Store at room temperature between 20 and 25 degrees C (68 and 77 degrees F). Store in [employer]. Protect from light and moisture. Keep tightly closed. Throw away any unused medicine after the expiration date. NOTE: This sheet is a summary. It may not cover all possible information. If you have questions about this medicine, talk to your doctor, pharmacist, or health care provider.  2019 Elsevier/Gold Standard (2013-08-27 17:57:36)       Signed, Elouise Munroe, MD  11/28/2018 9:15 AM    [place]

## 2018-11-28 NOTE — Patient Instructions (Signed)
Medication Instructions:  Your physician has recommended you make the following change in your medication:   1) START Metoprolol 12.74m twice daily. An Rx has been sent to your pharmacy 2) START Nitro-Glycerin use as directed. An Rx has been sent to your pharmacy  If you need a refill on your cardiac medications before your next appointment, please call your pharmacy.   Lab work: None ordered If you have labs (blood work) drawn today and your tests are completely normal, you will receive your results only by: .Marland KitchenMyChart Message (if you have MyChart) OR . A paper copy in the mail If you have any lab test that is abnormal or we need to change your treatment, we will call you to review the results.  Testing/Procedures: None ordered  Follow-Up: [facility] and your health needs are our priority.  As part of our continuing mission to provide you with exceptional heart care, we have created designated Provider Care Teams.  These Care Teams include your primary Cardiologist (physician) and Advanced Practice Providers (APPs -  Physician Assistants and Nurse Practitioners) who all work together to provide you with the care you need, when you need it.  Your appointment with Dr.Acharya is scheduled for 12/31/18 @ 9:40am  Any Other Special Instructions Will Be Listed Below (If Applicable).  Nitroglycerin sublingual tablets What is this medicine? NITROGLYCERIN (nye troe GLI ser in) is a type of vasodilator. It relaxes blood vessels, increasing the blood and oxygen supply to your heart. This medicine is used to relieve chest pain caused by angina. It is also used to prevent chest pain before activities like climbing stairs, going outdoors in cold weather, or sexual activity. This medicine may be used for other purposes; ask your health care provider or pharmacist if you have questions. COMMON BRAND NAME(S): Nitroquick, Nitrostat, Nitrotab What should I tell my health care provider before I  take this medicine? They need to know if you have any of these conditions: -anemia -head injury, recent stroke, or bleeding in the brain -liver disease -previous heart attack -an unusual or allergic reaction to nitroglycerin, other medicines, foods, dyes, or preservatives -pregnant or trying to get pregnant -breast-feeding How should I use this medicine? Take this medicine by mouth as needed. At the first sign of an angina attack (chest pain or tightness) place one tablet under your tongue. You can also take this medicine 5 to 10 minutes before an event likely to produce chest pain. Follow the directions on the prescription label. Let the tablet dissolve under the tongue. Do not swallow whole. Replace the dose if you accidentally swallow it. It will help if your mouth is not dry. Saliva around the tablet will help it to dissolve more quickly. Do not eat or drink, smoke or chew tobacco while a tablet is dissolving. If you are not better within 5 minutes after taking ONE dose of nitroglycerin, call 9-1-1 immediately to seek emergency medical care. Do not take more than 3 nitroglycerin tablets over 15 minutes. If you take this medicine often to relieve symptoms of angina, your doctor or health care professional may provide you with different instructions to manage your symptoms. If symptoms do not go away after following these instructions, it is important to call 9-1-1 immediately. Do not take more than 3 nitroglycerin tablets over 15 minutes. Talk to your pediatrician regarding the use of this medicine in children. Special care may be needed. Overdosage: If you think you have taken too much of this  medicine contact a poison control center or emergency room at once. NOTE: This medicine is only for you. Do not share this medicine with others. What if I miss a dose? This does not apply. This medicine is only used as needed. What may interact with this medicine? Do not take this medicine with any of the  following medications: -certain migraine medicines like ergotamine and dihydroergotamine (DHE) -medicines used to treat erectile dysfunction like sildenafil, tadalafil, and vardenafil -riociguat This medicine may also interact with the following medications: -alteplase -aspirin -heparin -medicines for high blood pressure -medicines for mental depression -other medicines used to treat angina -phenothiazines like chlorpromazine, mesoridazine, prochlorperazine, thioridazine This list may not describe all possible interactions. Give your health care provider a list of all the medicines, herbs, non-prescription drugs, or dietary supplements you use. Also tell them if you smoke, drink alcohol, or use illegal drugs. Some items may interact with your medicine. What should I watch for while using this medicine? Tell your doctor or health care professional if you feel your medicine is no longer working. Keep this medicine with you at all times. Sit or lie down when you take your medicine to prevent falling if you feel dizzy or faint after using it. Try to remain calm. This will help you to feel better faster. If you feel dizzy, take several deep breaths and lie down with your feet propped up, or bend forward with your head resting between your knees. You may get drowsy or dizzy. Do not drive, use machinery, or do anything that needs mental alertness until you know how this drug affects you. Do not stand or sit up quickly, especially if you are an older patient. This reduces the risk of dizzy or fainting spells. Alcohol can make you more drowsy and dizzy. Avoid alcoholic drinks. Do not treat yourself for coughs, colds, or pain while you are taking this medicine without asking your doctor or health care professional for advice. Some ingredients may increase your blood pressure. What side effects may I notice from receiving this medicine? Side effects that you should report to your doctor or health care  professional as soon as possible: -blurred vision -dry mouth -skin rash -sweating -the feeling of extreme pressure in the head -unusually weak or tired Side effects that usually do not require medical attention (report to your doctor or health care professional if they continue or are bothersome): -flushing of the face or neck -headache -irregular heartbeat, palpitations -nausea, vomiting This list may not describe all possible side effects. Call your doctor for medical advice about side effects. You may report side effects to FDA at 1-800-FDA-1088. Where should I keep my medicine? Keep out of the reach of children. Store at room temperature between 20 and 25 degrees C (68 and 77 degrees F). Store in [facility] [employer]. Protect from light and moisture. Keep tightly closed. Throw away any unused medicine after the expiration date. NOTE: This sheet is a summary. It may not cover all possible information. If you have questions about this medicine, talk to your doctor, pharmacist, or health care provider.  2019 Elsevier/Gold Standard (2013-08-27 17:57:36)

## 2018-12-02 ENCOUNTER — Other Ambulatory Visit: Payer: Self-pay | Admitting: Endocrinology

## 2018-12-02 NOTE — Telephone Encounter (Signed)
Invokana: Please refill prn  Topiramate: Please forward refill request to pt's new primary care provider.

## 2018-12-03 ENCOUNTER — Other Ambulatory Visit: Payer: Self-pay | Admitting: Family Medicine

## 2018-12-03 NOTE — Telephone Encounter (Signed)
Please advise if refill is appropriate 

## 2018-12-05 DIAGNOSIS — S42309A Unspecified fracture of shaft of humerus, unspecified arm, initial encounter for closed fracture: Secondary | ICD-10-CM | POA: Insufficient documentation

## 2018-12-05 DIAGNOSIS — M26609 Unspecified temporomandibular joint disorder, unspecified side: Secondary | ICD-10-CM | POA: Insufficient documentation

## 2018-12-05 DIAGNOSIS — N6459 Other signs and symptoms in breast: Secondary | ICD-10-CM | POA: Insufficient documentation

## 2018-12-05 DIAGNOSIS — R5383 Other fatigue: Secondary | ICD-10-CM | POA: Insufficient documentation

## 2018-12-31 ENCOUNTER — Encounter: Payer: Self-pay | Admitting: Internal Medicine

## 2018-12-31 ENCOUNTER — Ambulatory Visit (INDEPENDENT_AMBULATORY_CARE_PROVIDER_SITE_OTHER): Payer: 59 | Admitting: Internal Medicine

## 2018-12-31 VITALS — BP 97/67 | HR 75 | Ht 71.0 in | Wt 168.8 lb

## 2018-12-31 DIAGNOSIS — E1169 Type 2 diabetes mellitus with other specified complication: Secondary | ICD-10-CM

## 2018-12-31 DIAGNOSIS — E1140 Type 2 diabetes mellitus with diabetic neuropathy, unspecified: Secondary | ICD-10-CM | POA: Diagnosis not present

## 2018-12-31 DIAGNOSIS — R079 Chest pain, unspecified: Secondary | ICD-10-CM | POA: Diagnosis not present

## 2018-12-31 DIAGNOSIS — I2510 Atherosclerotic heart disease of native coronary artery without angina pectoris: Secondary | ICD-10-CM

## 2018-12-31 DIAGNOSIS — E785 Hyperlipidemia, unspecified: Secondary | ICD-10-CM

## 2018-12-31 NOTE — Patient Instructions (Signed)
Medication Instructions:  Dr.Acharya recommends that you continue on your current medications as directed. Please refer to the Current Medication list given to you today.  If you need a refill on your cardiac medications before your next appointment, please call your pharmacy.   Lab work: None ordered  Testing/Procedures: None ordered  Follow-Up: At [facility], you and your health needs are our priority.  As part of our continuing mission to provide you with exceptional heart care, we have created designated Provider Care Teams.  These Care Teams include your primary Cardiologist (physician) and Advanced Practice Providers (APPs -  Physician Assistants and Nurse Practitioners) who all work together to provide you with the care you need, when you need it.  You will need a follow up appointment in 6 weeks.    Any Other Special Instructions Will Be Listed Below (If Applicable). Please give [place] a callback to update Dr.Acharya after talking with your Gastrologist and Ophthalmologist.

## 2018-12-31 NOTE — H&P (View-Only) (Signed)
Cardiology Office Note:    Date:  12/31/2018   ID:  ELBERT POLYAKOV, DOB December 14, 1955, MRN [id number]  PCP:  Vivi Barrack, MD  Cardiologist:  No primary care provider on file.  Electrophysiologist:  None   Referring MD: Vivi Barrack, MD   Visit prior to coronary angiogram for abnormal CTA  History of Present Illness:    Savannah [name] is a 63 y.o. female with a hx of chest pain on exertion as well as atypical chest pain. Her past medical history is significant for PVCs, esophageal rings with multiple previous dilations, bariatric surgery in 2011 with appropriate postoperative weight loss.  We performed an echocardiogram stress test which had suboptimal stress images.  With that we performed a CT coronary angiogram.  CT coronary angiogram showed severe coronary artery disease with hemodynamic significance by CT FFR in the RCA and OM1, as well as the distal LAD.  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  She has previously endorsed some exertional discomfort in her chest when [place].  We participated in shared decision making about next steps for coronary artery disease. We last visited in January, and she chose to defer coronary angiography until after her trip to [place] with her brother. She is now amenable to coronary angiography for stable angina.  Past Medical History:  Diagnosis Date  . ABDOMINAL PAIN, CHRONIC 10/20/2009  . Acute cystitis 08/17/2009  . ALLERGIC RHINITIS 11/21/2007  . Allergy   . ASYMPTOMATIC POSTMENOPAUSAL STATUS 09/29/2008  . Bariatric surgery status 07/07/2010  . Bariatric surgery status 07/07/2010   Qualifier: Diagnosis of  By: Loanne Drilling MD, Jacelyn Pi   . Cancer ([place])    uterine  . Cataract   . DEPRESSION 11/21/2007  . DIABETES MELLITUS, TYPE II  07/08/2007  . DYSPNEA 05/20/2009  . Edema 05/20/2009  . Eosinophilic esophagitis 01/13/3544  . FEVER UNSPECIFIED 10/20/2009  . FEVER, HX OF 03/09/2010  . GERD (gastroesophageal reflux disease)   . Headache(784.0) 02/06/2010  . Hepatomegaly 01/06/2008  . HYPERLIPIDEMIA 07/08/2007  . HYPERTENSION 07/08/2007  . LEUKOPENIA, MILD 09/29/2008  . OBESITY 01/06/2008  . OSTEOARTHRITIS 01/06/2008  . Other chronic nonalcoholic liver disease 05/06/6388  . PERIPHERAL NEUROPATHY 11/21/2007  . Peripheral neuropathy   . Postsurgical hypothyroidism 09/19/2010  . SINUSITIS- ACUTE-NOS 11/21/2007  . TB SKIN TEST, POSITIVE 02/06/2010  . THYROID NODULE 03/09/2010    Past Surgical History:  Procedure Laterality Date  . ABDOMINAL HYSTERECTOMY    . BASAL CELL CARCINOMA EXCISION    . COLONOSCOPY    . EYE SURGERY Bilateral 08/2018, 09/2018  . KNEE ARTHROSCOPY Left   . LYMPH NODE DISSECTION    . OOPHORECTOMY    . Right total knee replacement    . ROUX-EN-Y GASTRIC BYPASS  2011  . SKIN TAG REMOVAL    . THYROIDECTOMY    . TONSILLECTOMY AND ADENOIDECTOMY    . UPPER GASTROINTESTINAL ENDOSCOPY      Current Medications: Current Meds  Medication Sig  . clobetasol cream (TEMOVATE) 3.73 % Apply 1 application topically 2 (two) times daily as needed (lichen sclerosus).   . diclofenac sodium (VOLTAREN) 1 % GEL Apply 4 g topically 4 (four) times daily. (Patient taking differently: Apply 4 g topically 4 (  four) times daily as needed (foot pain). )  . fluconazole (DIFLUCAN) 150 MG tablet Take 150 mg by mouth See admin instructions. Take 150 mg every other day for 8 days after antibiotics  . gabapentin (NEURONTIN) 300 MG capsule TAKE 2 CAPSULES BY MOUTH TWICE A DAY (Patient taking differently: Take 600 mg by mouth 2 (two) times daily. )  . halobetasol (ULTRAVATE) 0.05 % cream Apply 1 application topically 2 (two) times daily as needed (lichen sclerosus).   [name] loratadine (CLARITIN) 10 MG tablet Take 10 mg by mouth daily.  [name] MAGNESIUM PO  Take 1 tablet by mouth daily as needed (cramps).   . metFORMIN (GLUCOPHAGE-XR) 500 MG 24 hr tablet Take 2 tablets (1,000 mg total) by mouth 2 (two) times daily.  [name] MITIGARE 0.6 MG CAPS TAKE 1 CAPSULE BY MOUTH TWICE A DAY (Patient taking differently: Take 0.6 mg by mouth 2 (two) times daily. )  . Multiple Vitamin (MULTIVITAMIN) tablet Take 1 tablet by mouth 2 (two) times daily.   . Multiple Vitamins-Minerals (OCUVITE PO) Take 1 tablet by mouth daily.   . ONE TOUCH ULTRA TEST test strip USE TO CHECK BLOOD SUGAR 1 TIME PER DAY.  . pantoprazole (PROTONIX) 40 MG tablet Take 1 tablet (40 mg total) by mouth daily.  . repaglinide (PRANDIN) 2 MG tablet TAKE 1 TABLET BY MOUTH 3 TIMES A DAY BEFORE MEALS (Patient taking differently: Take 2 mg by mouth 2 (two) times daily. )  . TRULICITY 1.5 NI/6.2VO SOPN INJECT 1.5 MG INTO THE SKIN ONCE A WEEK. (Patient taking differently: Inject 1.5 mg as directed every Sunday. )  . [DISCONTINUED] Cholecalciferol (VITAMIN D3) 400 UNITS tablet Take 400 Units by mouth daily.    . [DISCONTINUED] Coenzyme Q10 (CO Q 10 PO) Take by mouth daily.  . [DISCONTINUED] docusate sodium (COLACE) 250 MG capsule Take 500 mg by mouth 2 (two) times daily.    . [DISCONTINUED] Flaxseed, Linseed, (FLAX SEED OIL PO) Take 1 capsule by mouth daily.    . [DISCONTINUED] hydrOXYzine (ATARAX/VISTARIL) 25 MG tablet Take 25 mg by mouth at bedtime. Take 2 tablets daily at bedtime.  . [DISCONTINUED] INVOKANA 300 MG TABS tablet TAKE 1 TABLET EVERY DAY BEFORE BREAKFAST  . [DISCONTINUED] levocetirizine (XYZAL) 5 MG tablet Take 5 mg by mouth every evening.  . [DISCONTINUED] levothyroxine (SYNTHROID, LEVOTHROID) 125 MCG tablet TAKE 1 TABLET (125 MCG TOTAL) BY MOUTH DAILY BEFORE BREAKFAST.  . [DISCONTINUED] metoprolol tartrate (LOPRESSOR) 25 MG tablet Take 0.5 tablets (12.5 mg total) by mouth 2 (two) times daily.  . [DISCONTINUED] PREBIOTIC PRODUCT PO Take by mouth daily.  . [DISCONTINUED] Probiotic Product  (PROBIOTIC PO) Take by mouth daily.  . [DISCONTINUED] traMADol (ULTRAM) 50 MG tablet Take 1 tablet (50 mg total) by mouth every 6 (six) hours as needed.     Allergies:   Ace inhibitors; Caffeine; Ciprofloxacin; Mucinex [guaifenesin er]; Nsaids; and Other   Social History   Socioeconomic History  . Marital status: Single    Spouse name: Not on file  . Number of children: 0  . Years of education: Not on file  . Highest education level: Not on file  Occupational History  . Occupation: Optometrist  Social Needs  . Financial resource strain: Not on file  . Food insecurity:    Worry: Not on file    Inability: Not on file  . Transportation needs:    Medical: Not on file    Non-medical: Not on file  Tobacco Use  . Smoking  status: Former Smoker    Packs/day: 1.50    Years: 15.00    Pack years: 22.50    Types: Cigarettes  . Smokeless tobacco: Never Used  Substance and Sexual Activity  . Alcohol use: Yes    Alcohol/week: 0.0 standard drinks    Comment: rarely  . Drug use: Never  . Sexual activity: Not on file  Lifestyle  . Physical activity:    Days per week: Not on file    Minutes per session: Not on file  . Stress: Not on file  Relationships  . Social connections:    Talks on phone: Not on file    Gets together: Not on file    Attends religious service: Not on file    Active member of club or organization: Not on file    Attends meetings of clubs or organizations: Not on file    Relationship status: Not on file  Other Topics Concern  . Not on file  Social History Narrative  . Not on file     Family History: The patient's family history includes Breast cancer in her maternal aunt and sister; Colon cancer in her maternal uncle; Congestive Heart Failure in her father; Diabetes in her father, maternal aunt, maternal grandmother, maternal uncle, mother, and paternal grandmother; Hypertension in her father; Liver cancer in her maternal aunt; Multiple sclerosis in her sister;  Myelodysplastic syndrome in her father; Osteoporosis in her sister.  ROS:   Please see the history of present illness.    All other systems reviewed and are negative.  EKGs/Labs/Other Studies Reviewed:    The following studies were reviewed today:  EKG:  Not performed today.  Recent Labs: 03/21/2018: ALT 23; Magnesium 2.2 01/09/2019: BUN 17; Creatinine, Ser 1.29; Hemoglobin 13.8; Platelets 180; Potassium 4.0; Sodium 142; TSH 0.241  Recent Lipid Panel    Component Value Date/Time   CHOL 166 07/04/2018 1403   TRIG 123.0 07/04/2018 1403   HDL 45.80 07/04/2018 1403   CHOLHDL 4 07/04/2018 1403   VLDL 24.6 07/04/2018 1403   LDLCALC 95 07/04/2018 1403   LDLDIRECT 99.0 10/12/2011 1219    Physical Exam:    VS:  BP 97/67   Pulse 75   Ht 5' 11"  (1.803 m)   Wt 168 lb 12.8 oz (76.6 kg)   BMI 23.54 kg/m     Wt Readings from Last 3 Encounters:  01/09/19 165 lb 6.4 oz (75 kg)  12/31/18 168 lb 12.8 oz (76.6 kg)  11/28/18 170 lb (77.1 kg)     Constitutional: No acute distress Cardiovascular: regular rhythm, normal rate, no murmurs. S1 and S2 normal. Radial pulses normal bilaterally. No jugular venous distention.  Respiratory: clear to auscultation bilaterally GI : normal bowel sounds, soft and nontender. No distention.   MSK: extremities warm, well perfused. No edema.  NEURO: grossly nonfocal exam, moves all extremities. PSYCH: alert and oriented x 3, normal mood and affect.   ASSESSMENT:    1. Coronary artery disease involving native coronary artery of native heart without angina pectoris   2. Type 2 diabetes mellitus with diabetic neuropathy, unspecified whether long term insulin use ([place])   3. Dyslipidemia associated with type 2 diabetes mellitus ([facility])   4. Chest pain, unspecified type    PLAN:    We will plan for coronary angiography for possible treatment of coronary lesions seen on CT contributing to chest pain with both exertional an atypical features.  INFORMED  CONSENT: I have reviewed the risks, indications, and alternatives  to cardiac catheterization, possible angioplasty, and stenting with the patient. Risks include but are not limited to bleeding, infection, vascular injury, stroke, myocardial infection, arrhythmia, kidney injury, radiation-related injury in the case of prolonged fluoroscopy use, emergency cardiac surgery, and death. The patient understands the risks of serious complication is 1-2 in 6440 with diagnostic cardiac cath and 1-2% or less with angioplasty/stenting.   She has clarified that she is cleared by GI to take daily baby aspirin if necessary.   Medication Adjustments/Labs and Tests Ordered: Current medicines are reviewed at length with the patient today.  Concerns regarding medicines are outlined above.  No orders of the defined types were placed in this encounter.  No orders of the defined types were placed in this encounter.   Patient Instructions  Medication Instructions:  Dr.Shailey Butterbaugh recommends that you continue on your current medications as directed. Please refer to the Current Medication list given to you today.  If you need a refill on your cardiac medications before your next appointment, please call your pharmacy.   Lab work: None ordered  Testing/Procedures: None ordered  Follow-Up: At [facility], you and your health needs are our priority.  As part of our continuing mission to provide you with exceptional heart care, we have created designated Provider Care Teams.  These Care Teams include your primary Cardiologist (physician) and Advanced Practice Providers (APPs -  Physician Assistants and Nurse Practitioners) who all work together to provide you with the care you need, when you need it.  You will need a follow up appointment in 6 weeks.    Any Other Special Instructions Will Be Listed Below (If Applicable). Please give [place] a callback to update Dr.Megahn Killings after talking with your Gastrologist and  Ophthalmologist.        Signed, Elouise Munroe, MD  12/31/2018 9:13 AM    [facility]

## 2018-12-31 NOTE — Progress Notes (Signed)
Cardiology Office Note:    Date:  12/31/2018   ID:  SHASTA CHINN, DOB 07-18-1956, MRN [id number]  PCP:  Vivi Barrack, MD  Cardiologist:  No primary care provider on file.  Electrophysiologist:  None   Referring MD: Vivi Barrack, MD   Visit prior to coronary angiogram for abnormal CTA  History of Present Illness:    OZELLE BRUBACHER is a 63 y.o. female with a hx of chest pain on exertion as well as atypical chest pain. Her past medical history is significant for PVCs, esophageal rings with multiple previous dilations, bariatric surgery in 2011 with appropriate postoperative weight loss.  We performed an echocardiogram stress test which had suboptimal stress images.  With that we performed a CT coronary angiogram.  CT coronary angiogram showed severe coronary artery disease with hemodynamic significance by CT FFR in the RCA and OM1, as well as the distal LAD.  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  She has previously endorsed some exertional discomfort in her chest when [place] work.  We participated in shared decision making about next steps for coronary artery disease. We last visited in January, and she chose to defer coronary angiography until after her trip to [place] with her brother. She is now amenable to coronary angiography for stable angina.  Past Medical History:  Diagnosis Date  . ABDOMINAL PAIN, CHRONIC 10/20/2009  . Acute cystitis 08/17/2009  . ALLERGIC RHINITIS 11/21/2007  . Allergy   . ASYMPTOMATIC POSTMENOPAUSAL STATUS 09/29/2008  . Bariatric surgery status 07/07/2010  . Bariatric surgery status 07/07/2010   Qualifier: Diagnosis of  By: Loanne Drilling MD, Jacelyn Pi   . Cancer ([place])    uterine  . Cataract   . DEPRESSION 11/21/2007  . DIABETES MELLITUS, TYPE II  07/08/2007  . DYSPNEA 05/20/2009  . Edema 05/20/2009  . Eosinophilic esophagitis 12/18/6387  . FEVER UNSPECIFIED 10/20/2009  . FEVER, HX OF 03/09/2010  . GERD (gastroesophageal reflux disease)   . Headache(784.0) 02/06/2010  . Hepatomegaly 01/06/2008  . HYPERLIPIDEMIA 07/08/2007  . HYPERTENSION 07/08/2007  . LEUKOPENIA, MILD 09/29/2008  . OBESITY 01/06/2008  . OSTEOARTHRITIS 01/06/2008  . Other chronic nonalcoholic liver disease 3/73/4287  . PERIPHERAL NEUROPATHY 11/21/2007  . Peripheral neuropathy   . Postsurgical hypothyroidism 09/19/2010  . SINUSITIS- ACUTE-NOS 11/21/2007  . TB SKIN TEST, POSITIVE 02/06/2010  . THYROID NODULE 03/09/2010    Past Surgical History:  Procedure Laterality Date  . ABDOMINAL HYSTERECTOMY    . BASAL CELL CARCINOMA EXCISION    . COLONOSCOPY    . EYE SURGERY Bilateral 08/2018, 09/2018  . KNEE ARTHROSCOPY Left   . LYMPH NODE DISSECTION    . OOPHORECTOMY    . Right total knee replacement    . ROUX-EN-Y GASTRIC BYPASS  2011  . SKIN TAG REMOVAL    . THYROIDECTOMY    . TONSILLECTOMY AND ADENOIDECTOMY    . UPPER GASTROINTESTINAL ENDOSCOPY      Current Medications: Current Meds  Medication Sig  . clobetasol cream (TEMOVATE) 6.81 % Apply 1 application topically 2 (two) times daily as needed (lichen sclerosus).   . diclofenac sodium (VOLTAREN) 1 % GEL Apply 4 g topically 4 (four) times daily. (Patient taking differently: Apply 4 g topically 4 (  four) times daily as needed (foot pain). )  . fluconazole (DIFLUCAN) 150 MG tablet Take 150 mg by mouth See admin instructions. Take 150 mg every other day for 8 days after antibiotics  . gabapentin (NEURONTIN) 300 MG capsule TAKE 2 CAPSULES BY MOUTH TWICE A DAY (Patient taking differently: Take 600 mg by mouth 2 (two) times daily. )  . halobetasol (ULTRAVATE) 0.05 % cream Apply 1 application topically 2 (two) times daily as needed (lichen sclerosus).   [name] loratadine (CLARITIN) 10 MG tablet Take 10 mg by mouth daily.  [name] MAGNESIUM PO  Take 1 tablet by mouth daily as needed (cramps).   . metFORMIN (GLUCOPHAGE-XR) 500 MG 24 hr tablet Take 2 tablets (1,000 mg total) by mouth 2 (two) times daily.  [name] MITIGARE 0.6 MG CAPS TAKE 1 CAPSULE BY MOUTH TWICE A DAY (Patient taking differently: Take 0.6 mg by mouth 2 (two) times daily. )  . Multiple Vitamin (MULTIVITAMIN) tablet Take 1 tablet by mouth 2 (two) times daily.   . Multiple Vitamins-Minerals (OCUVITE PO) Take 1 tablet by mouth daily.   . ONE TOUCH ULTRA TEST test strip USE TO CHECK BLOOD SUGAR 1 TIME PER DAY.  . pantoprazole (PROTONIX) 40 MG tablet Take 1 tablet (40 mg total) by mouth daily.  . repaglinide (PRANDIN) 2 MG tablet TAKE 1 TABLET BY MOUTH 3 TIMES A DAY BEFORE MEALS (Patient taking differently: Take 2 mg by mouth 2 (two) times daily. )  . TRULICITY 1.5 KX/3.8HW SOPN INJECT 1.5 MG INTO THE SKIN ONCE A WEEK. (Patient taking differently: Inject 1.5 mg as directed every Sunday. )  . [DISCONTINUED] Cholecalciferol (VITAMIN D3) 400 UNITS tablet Take 400 Units by mouth daily.    . [DISCONTINUED] Coenzyme Q10 (CO Q 10 PO) Take by mouth daily.  . [DISCONTINUED] docusate sodium (COLACE) 250 MG capsule Take 500 mg by mouth 2 (two) times daily.    . [DISCONTINUED] Flaxseed, Linseed, (FLAX SEED OIL PO) Take 1 capsule by mouth daily.    . [DISCONTINUED] hydrOXYzine (ATARAX/VISTARIL) 25 MG tablet Take 25 mg by mouth at bedtime. Take 2 tablets daily at bedtime.  . [DISCONTINUED] INVOKANA 300 MG TABS tablet TAKE 1 TABLET EVERY DAY BEFORE BREAKFAST  . [DISCONTINUED] levocetirizine (XYZAL) 5 MG tablet Take 5 mg by mouth every evening.  . [DISCONTINUED] levothyroxine (SYNTHROID, LEVOTHROID) 125 MCG tablet TAKE 1 TABLET (125 MCG TOTAL) BY MOUTH DAILY BEFORE BREAKFAST.  . [DISCONTINUED] metoprolol tartrate (LOPRESSOR) 25 MG tablet Take 0.5 tablets (12.5 mg total) by mouth 2 (two) times daily.  . [DISCONTINUED] PREBIOTIC PRODUCT PO Take by mouth daily.  . [DISCONTINUED] Probiotic Product  (PROBIOTIC PO) Take by mouth daily.  . [DISCONTINUED] traMADol (ULTRAM) 50 MG tablet Take 1 tablet (50 mg total) by mouth every 6 (six) hours as needed.     Allergies:   Ace inhibitors; Caffeine; Ciprofloxacin; Mucinex [guaifenesin er]; Nsaids; and Other   Social History   Socioeconomic History  . Marital status: Single    Spouse name: Not on file  . Number of children: 0  . Years of education: Not on file  . Highest education level: Not on file  Occupational History  . Occupation: Optometrist  Social Needs  . Financial resource strain: Not on file  . Food insecurity:    Worry: Not on file    Inability: Not on file  . Transportation needs:    Medical: Not on file    Non-medical: Not on file  Tobacco Use  . Smoking  status: Former Smoker    Packs/day: 1.50    Years: 15.00    Pack years: 22.50    Types: Cigarettes  . Smokeless tobacco: Never Used  Substance and Sexual Activity  . Alcohol use: Yes    Alcohol/week: 0.0 standard drinks    Comment: rarely  . Drug use: Never  . Sexual activity: Not on file  Lifestyle  . Physical activity:    Days per week: Not on file    Minutes per session: Not on file  . Stress: Not on file  Relationships  . Social connections:    Talks on phone: Not on file    Gets together: Not on file    Attends religious service: Not on file    Active member of club or organization: Not on file    Attends meetings of clubs or organizations: Not on file    Relationship status: Not on file  Other Topics Concern  . Not on file  Social History Narrative  . Not on file     Family History: The patient's family history includes Breast cancer in her maternal aunt and sister; Colon cancer in her maternal uncle; Congestive Heart Failure in her father; Diabetes in her father, maternal aunt, maternal grandmother, maternal uncle, mother, and paternal grandmother; Hypertension in her father; Liver cancer in her maternal aunt; Multiple sclerosis in her sister;  Myelodysplastic syndrome in her father; Osteoporosis in her sister.  ROS:   Please see the history of present illness.    All other systems reviewed and are negative.  EKGs/Labs/Other Studies Reviewed:    The following studies were reviewed today:  EKG:  Not performed today.  Recent Labs: 03/21/2018: ALT 23; Magnesium 2.2 01/09/2019: BUN 17; Creatinine, Ser 1.29; Hemoglobin 13.8; Platelets 180; Potassium 4.0; Sodium 142; TSH 0.241  Recent Lipid Panel    Component Value Date/Time   CHOL 166 07/04/2018 1403   TRIG 123.0 07/04/2018 1403   HDL 45.80 07/04/2018 1403   CHOLHDL 4 07/04/2018 1403   VLDL 24.6 07/04/2018 1403   LDLCALC 95 07/04/2018 1403   LDLDIRECT 99.0 10/12/2011 1219    Physical Exam:    VS:  BP 97/67   Pulse 75   Ht 5' 11"  (1.803 m)   Wt 168 lb 12.8 oz (76.6 kg)   BMI 23.54 kg/m     Wt Readings from Last 3 Encounters:  01/09/19 165 lb 6.4 oz (75 kg)  12/31/18 168 lb 12.8 oz (76.6 kg)  11/28/18 170 lb (77.1 kg)     Constitutional: No acute distress Cardiovascular: regular rhythm, normal rate, no murmurs. S1 and S2 normal. Radial pulses normal bilaterally. No jugular venous distention.  Respiratory: clear to auscultation bilaterally GI : normal bowel sounds, soft and nontender. No distention.   MSK: extremities warm, well perfused. No edema.  NEURO: grossly nonfocal exam, moves all extremities. PSYCH: alert and oriented x 3, normal mood and affect.   ASSESSMENT:    1. Coronary artery disease involving native coronary artery of native heart without angina pectoris   2. Type 2 diabetes mellitus with diabetic neuropathy, unspecified whether long term insulin use ([place])   3. Dyslipidemia associated with type 2 diabetes mellitus ([facility])   4. Chest pain, unspecified type    PLAN:    We will plan for coronary angiography for possible treatment of coronary lesions seen on CT contributing to chest pain with both exertional an atypical features.  INFORMED  CONSENT: I have reviewed the risks, indications, and alternatives  to cardiac catheterization, possible angioplasty, and stenting with the patient. Risks include but are not limited to bleeding, infection, vascular injury, stroke, myocardial infection, arrhythmia, kidney injury, radiation-related injury in the case of prolonged fluoroscopy use, emergency cardiac surgery, and death. The patient understands the risks of serious complication is 1-2 in 7680 with diagnostic cardiac cath and 1-2% or less with angioplasty/stenting.   She has clarified that she is cleared by GI to take daily baby aspirin if necessary.   Medication Adjustments/Labs and Tests Ordered: Current medicines are reviewed at length with the patient today.  Concerns regarding medicines are outlined above.  No orders of the defined types were placed in this encounter.  No orders of the defined types were placed in this encounter.   Patient Instructions  Medication Instructions:  Dr.Jakson Delpilar recommends that you continue on your current medications as directed. Please refer to the Current Medication list given to you today.  If you need a refill on your cardiac medications before your next appointment, please call your pharmacy.   Lab work: None ordered  Testing/Procedures: None ordered  Follow-Up: At [facility], you and your health needs are our priority.  As part of our continuing mission to provide you with exceptional heart care, we have created designated Provider Care Teams.  These Care Teams include your primary Cardiologist (physician) and Advanced Practice Providers (APPs -  Physician Assistants and Nurse Practitioners) who all work together to provide you with the care you need, when you need it.  You will need a follow up appointment in 6 weeks.    Any Other Special Instructions Will Be Listed Below (If Applicable). Please give [place] a callback to update Dr.Soriyah Osberg after talking with your Gastrologist and  Ophthalmologist.        Signed, Elouise Munroe, MD  12/31/2018 9:13 AM    [place]

## 2019-01-07 ENCOUNTER — Other Ambulatory Visit: Payer: Self-pay

## 2019-01-07 ENCOUNTER — Telehealth: Payer: Self-pay | Admitting: Internal Medicine

## 2019-01-07 DIAGNOSIS — Z01812 Encounter for preprocedural laboratory examination: Secondary | ICD-10-CM

## 2019-01-07 MED ORDER — ASPIRIN EC 81 MG PO TBEC: 81.0000 mg | DELAYED_RELEASE_TABLET | Freq: Every day | ORAL | Status: DC

## 2019-01-07 NOTE — Telephone Encounter (Signed)
Spoke to patient . She states she spoke her other doctors. She states she is ready to schedule Cardiac cath. Patient states shew will let nurse know if there is a day that is not feasible for her.

## 2019-01-07 NOTE — Telephone Encounter (Signed)
Update fwd to Dr.Acharya,  will await further instruction from MD.

## 2019-01-07 NOTE — Telephone Encounter (Signed)
Pt aware that her cardiac cath is scheduled on 01/19/19 @ 7:30am with Dr.Smith. She is to arrive at [facility] at 5:30am. Pt given verbal  pre-procedure instructions, copy of the instructions have been mailed. Adv pt per Dr.Acharya she is to start Aspirin 75m coated tab the day before the procedure, and she is to also take it the morning of the procedure. Patient verbalizes understanding of the instructions given, adv pt to contact the office if any question, concerns or if symptoms develop.  Message sent to pr-cert.

## 2019-01-07 NOTE — Telephone Encounter (Addendum)
Called patient to confirm whether she was able to talk with her Opthalmologist and Bariatric surgeon on whether or not it would be safe for her to start a low dose Aspirin 81mg  prior to her cardiac cath and continue DAPT after the cath if PCI is indicated. Patient sts that both physicians are ok with her being on DAPT and suggested that she proceed with her Cardiologist recommendation. Advised the pt that I will update Dr.Acharya, and work on getting the cath scheduled. Pt request that week of 01/19/19 when her sister will be available to accompany her to the procedure. Adv pt that she will need labwork prior to the procedure, she request that the lab orders be mailed to her and she will bring them with her to her appt with her Endocrinologist @ [place] next week. Lab orders placed, printed, mailed. Adv pt that I will call back to discus pre-procdure instructions, date , time, and location. Pt verbalizes understanding.

## 2019-01-07 NOTE — Telephone Encounter (Signed)
New Message:   Patient calling to report  Some results from the doctors she had to follow up appt.

## 2019-01-08 ENCOUNTER — Telehealth: Payer: Self-pay | Admitting: Internal Medicine

## 2019-01-08 NOTE — Telephone Encounter (Signed)
New Message    Patient needs to reschedule heart cath appointment

## 2019-01-09 ENCOUNTER — Telehealth: Payer: Self-pay | Admitting: Internal Medicine

## 2019-01-09 ENCOUNTER — Ambulatory Visit: Payer: 59 | Admitting: Endocrinology

## 2019-01-09 ENCOUNTER — Ambulatory Visit (INDEPENDENT_AMBULATORY_CARE_PROVIDER_SITE_OTHER): Payer: Self-pay | Admitting: Endocrinology

## 2019-01-09 ENCOUNTER — Other Ambulatory Visit: Payer: Self-pay

## 2019-01-09 ENCOUNTER — Encounter: Payer: Self-pay | Admitting: Endocrinology

## 2019-01-09 VITALS — Ht 71.0 in | Wt 165.4 lb

## 2019-01-09 DIAGNOSIS — E119 Type 2 diabetes mellitus without complications: Secondary | ICD-10-CM

## 2019-01-09 DIAGNOSIS — Z794 Long term (current) use of insulin: Secondary | ICD-10-CM

## 2019-01-09 DIAGNOSIS — Z9884 Bariatric surgery status: Secondary | ICD-10-CM | POA: Diagnosis not present

## 2019-01-09 DIAGNOSIS — Z01812 Encounter for preprocedural laboratory examination: Secondary | ICD-10-CM | POA: Diagnosis not present

## 2019-01-09 LAB — GLUCOSE, POCT (MANUAL RESULT ENTRY): POC Glucose: 173 mg/dl — AB (ref 70–99)

## 2019-01-09 LAB — POCT GLYCOSYLATED HEMOGLOBIN (HGB A1C): Hemoglobin A1C: 6.9 % — AB (ref 4.0–5.6)

## 2019-01-09 NOTE — Telephone Encounter (Signed)
Spoke with the patient. The patient's cardiac cath is scheduled for 01/19/19. The patient sts that she will need to reschedule the procedure, she is scheduled to have jury duty that day. She would like to reschedule for the next day 01/20/19 or any other day that week.  Adv pt that I will contact [facility] cath lab and call her back with the rescheduled date and time.  Pt cardiac cath rescheduled for 01/20/19 @ 7:30am with Dr.Smith. Spoke with the patient and she is aware of the rescheduled date and time of the procedure. Adv pt that the previous instruction still apply. Pt verbalizes understanding and voiced appreciation for the assistance.

## 2019-01-09 NOTE — Progress Notes (Signed)
Subjective:    Patient ID: Savannah [name], female    DOB: 02/08/1956, 63 y.o.   MRN: [id number]  HPI Pt returns for f/u of diabetes mellitus:  DM type: 2 Dx'ed: 4782 Complications: painful polyneuropathy, and CAD (seen on CT) Therapy: 4 oral meds GDM: never DKA: never Severe hypoglycemia: never.   Pancreatitis: never.  Other: she took insulin from 2004-2011, when she had gastric bypass surgery.   Interval history: she takes meds as rx'ed.  pt states she feels well in general, except for lightheadedness.  Pt calls cardiol from the exam room today.   Past Medical History:  Diagnosis Date  . ABDOMINAL PAIN, CHRONIC 10/20/2009  . Acute cystitis 08/17/2009  . ALLERGIC RHINITIS 11/21/2007  . Allergy   . ASYMPTOMATIC POSTMENOPAUSAL STATUS 09/29/2008  . Bariatric surgery status 07/07/2010  . Bariatric surgery status 07/07/2010   Qualifier: Diagnosis of  By: Loanne Drilling MD, Jacelyn Pi   . Cancer ([place])    uterine  . Cataract   . DEPRESSION 11/21/2007  . DIABETES MELLITUS, TYPE II 07/08/2007  . DYSPNEA 05/20/2009  . Edema 05/20/2009  . Eosinophilic esophagitis 07/17/6212  . FEVER UNSPECIFIED 10/20/2009  . FEVER, HX OF 03/09/2010  . GERD (gastroesophageal reflux disease)   . Headache(784.0) 02/06/2010  . Hepatomegaly 01/06/2008  . HYPERLIPIDEMIA 07/08/2007  . HYPERTENSION 07/08/2007  . LEUKOPENIA, MILD 09/29/2008  . OBESITY 01/06/2008  . OSTEOARTHRITIS 01/06/2008  . Other chronic nonalcoholic liver disease 0/86/5784  . PERIPHERAL NEUROPATHY 11/21/2007  . Peripheral neuropathy   . Postsurgical hypothyroidism 09/19/2010  . SINUSITIS- ACUTE-NOS 11/21/2007  . TB SKIN TEST, POSITIVE 02/06/2010  . THYROID NODULE 03/09/2010    Past Surgical History:  Procedure Laterality Date  . ABDOMINAL HYSTERECTOMY    . BASAL CELL CARCINOMA EXCISION    . COLONOSCOPY    . EYE SURGERY Bilateral 08/2018, 09/2018  . KNEE ARTHROSCOPY Left   . LYMPH NODE DISSECTION    . OOPHORECTOMY    . Right total knee replacement    .  ROUX-EN-Y GASTRIC BYPASS  2011  . SKIN TAG REMOVAL    . THYROIDECTOMY    . TONSILLECTOMY AND ADENOIDECTOMY    . UPPER GASTROINTESTINAL ENDOSCOPY      Social History   Socioeconomic History  . Marital status: Single    Spouse name: Not on file  . Number of children: 0  . Years of education: Not on file  . Highest education level: Not on file  Occupational History  . Occupation: Optometrist  Social Needs  . Financial resource strain: Not on file  . Food insecurity:    Worry: Not on file    Inability: Not on file  . Transportation needs:    Medical: Not on file    Non-medical: Not on file  Tobacco Use  . Smoking status: Former Smoker    Packs/day: 1.50    Years: 15.00    Pack years: 22.50    Types: Cigarettes  . Smokeless tobacco: Never Used  Substance and Sexual Activity  . Alcohol use: Yes    Alcohol/week: 0.0 standard drinks    Comment: rarely  . Drug use: Never  . Sexual activity: Not on file  Lifestyle  . Physical activity:    Days per week: Not on file    Minutes per session: Not on file  . Stress: Not on file  Relationships  . Social connections:    Talks on phone: Not on file    Gets together: Not on file  Attends religious service: Not on file    Active member of club or organization: Not on file    Attends meetings of clubs or organizations: Not on file    Relationship status: Not on file  . Intimate partner violence:    Fear of current or ex partner: Not on file    Emotionally abused: Not on file    Physically abused: Not on file    Forced sexual activity: Not on file  Other Topics Concern  . Not on file  Social History Narrative  . Not on file    Current Outpatient Medications on File Prior to Visit  Medication Sig Dispense Refill  . [START ON 01/18/2019] aspirin EC 81 MG tablet Take 1 tablet (81 mg total) by mouth daily. Starting 01/18/19 prior to cardiac cath    . Cholecalciferol (VITAMIN D3) 400 UNITS tablet Take 400 Units by mouth daily.        . clobetasol cream (TEMOVATE) 0.05 % clobetasol 0.05 % topical cream  APPLY THIN COAT TO AFFECTED AREA TWICE A DAY    . Coenzyme Q10 (CO Q 10 PO) Take by mouth daily.    . diclofenac sodium (VOLTAREN) 1 % GEL Apply 4 g topically 4 (four) times daily. 100 g 11  . docusate sodium (COLACE) 250 MG capsule Take 500 mg by mouth 2 (two) times daily.      . fluconazole (DIFLUCAN) 150 MG tablet Take 150 mg by mouth as needed.    . gabapentin (NEURONTIN) 300 MG capsule TAKE 2 CAPSULES BY MOUTH TWICE A DAY 360 capsule 2  . halobetasol (ULTRAVATE) 0.05 % cream APPLY TO AFFECTED AREA TWICE A DAY AS NEEDED  2  . hydrOXYzine (ATARAX/VISTARIL) 25 MG tablet Take 25 mg by mouth at bedtime. Take 2 tablets daily at bedtime.    [name] loratadine (CLARITIN) 10 MG tablet Take 10 mg by mouth daily.    [name] MAGNESIUM PO Take by mouth as needed.    . metFORMIN (GLUCOPHAGE-XR) 500 MG 24 hr tablet Take 2 tablets (1,000 mg total) by mouth 2 (two) times daily. 360 tablet 0  . MITIGARE 0.6 MG CAPS TAKE 1 CAPSULE BY MOUTH TWICE A DAY (Patient taking differently: 2 tablets. ) 60 capsule 2  . Multiple Vitamin (MULTIVITAMIN) tablet Take 1 tablet by mouth 2 (two) times daily.     . Multiple Vitamins-Minerals (OCUVITE PO) Take by mouth daily.    . nitroGLYCERIN (NITROSTAT) 0.4 MG SL tablet Place 1 tablet (0.4 mg total) under the tongue every 5 (five) minutes as needed. 25 tablet 3  . ONE TOUCH ULTRA TEST test strip USE TO CHECK BLOOD SUGAR 1 TIME PER DAY. 100 each 5  . pantoprazole (PROTONIX) 40 MG tablet Take 1 tablet (40 mg total) by mouth daily. 30 tablet 3  . repaglinide (PRANDIN) 2 MG tablet TAKE 1 TABLET BY MOUTH 3 TIMES A DAY BEFORE MEALS 270 tablet 1  . TRULICITY 1.5 NI/7.7OE SOPN INJECT 1.5 MG INTO THE SKIN ONCE A WEEK. 6 pen 11  . atorvastatin (LIPITOR) 40 MG tablet Take 1 tablet (40 mg total) by mouth daily. 90 tablet 3   No current facility-administered medications on file prior to visit.     Allergies  Allergen  Reactions  . Ace Inhibitors     REACTION: Cough  . Caffeine-Sodium Benzoate     REACTION: PVCs  . Ciprofloxacin Itching  . Nsaids     Gastric Bypass Surgery - unable to take  . Other  Antihistamine-alkylamine - PVCs    Family History  Problem Relation Age of Onset  . Multiple sclerosis Sister   . Breast cancer Sister        breast onset age 73  . Osteoporosis Sister   . Diabetes Mother   . Diabetes Father   . Congestive Heart Failure Father   . Hypertension Father   . Myelodysplastic syndrome Father   . Liver cancer Maternal Aunt   . Breast cancer Maternal Aunt   . Diabetes Maternal Aunt   . Colon cancer Maternal Uncle   . Diabetes Maternal Uncle   . Diabetes Maternal Grandmother   . Diabetes Paternal Grandmother     Ht 5\' 11"  (1.803 m)   Wt 165 lb 6.4 oz (75 kg)   SpO2 95%   BMI 23.07 kg/m    Review of Systems Denies chest pain and LOC.       Objective:   Physical Exam VITAL SIGNS:  See vs page GENERAL: no distress Multiple abrasions and ecchymoses, on the forearms. Pulses: foot pulses are intact bilaterally.   MSK: no deformity of the feet or ankles.  CV: trace bilat edema of the legs.   Skin:  no ulcer on the feet or ankles.  normal color and temp on the feet and ankles Neuro: sensation is intact to touch on the feet and ankles, but decreased from normal.   Lab Results  Component Value Date   HGBA1C 6.9 (A) 01/09/2019      Assessment & Plan:  Type 2 DM: well-controlled Hypotension: new, prob due to b-blocker.  cardiol tells pt to go to ER, but pt says she wants to drink fluids at home CAD: as she will have cath soon, we'll d/c invokana for now.    Patient Instructions  Please stop taking the Invokana and the metoprolol. You should drink plenty of fluids.   If you develop chest pain, you should go to the emergency room.  Blood tests are requested for you today.  We'll let you know about the results.  Please come back for a follow-up appointment  in 1 month, when we'll hope to resume the Invokana.

## 2019-01-09 NOTE — Telephone Encounter (Signed)
Spoke with DOD Dr.Crenshaw who advised that patient should go to the ER to have IV fluids pushed and to hold the Metoprolol.  Patient verbalized understanding, will go have fluids to bring up BP.

## 2019-01-09 NOTE — Telephone Encounter (Signed)
Pt is at Endocrinologist and just had Orthostatic BP Test done 3x: Seated, Standing and laying down. Test showed an issue, and was advised to speak with her Cardiologist   Lying: 112/60 Sitting: 90/60 Standing: 68/58  Appt was supposed to be this morning because she too dizzy and could not get out of bed. Rescheduled for this afternoon.

## 2019-01-09 NOTE — Patient Instructions (Addendum)
Please stop taking the Invokana and the metoprolol. You should drink plenty of fluids.   If you develop chest pain, you should go to the emergency room.  Blood tests are requested for you today.  We'll let you know about the results.  Please come back for a follow-up appointment in 1 month, when we'll hope to resume the Invokana.

## 2019-01-10 LAB — BASIC METABOLIC PANEL
BUN/Creatinine Ratio: 13 (ref 12–28)
BUN: 17 mg/dL (ref 8–27)
CO2: 20 mmol/L (ref 20–29)
Calcium: 9.4 mg/dL (ref 8.7–10.3)
Chloride: 108 mmol/L — ABNORMAL HIGH (ref 96–106)
Creatinine, Ser: 1.29 mg/dL — ABNORMAL HIGH (ref 0.57–1.00)
GFR calc Af Amer: 51 mL/min/{1.73_m2} — ABNORMAL LOW (ref >59)
GFR calc non Af Amer: 44 mL/min/{1.73_m2} — ABNORMAL LOW (ref >59)
Glucose: 148 mg/dL — ABNORMAL HIGH (ref 65–99)
Potassium: 4 mmol/L (ref 3.5–5.2)
Sodium: 142 mmol/L (ref 134–144)

## 2019-01-10 LAB — CBC WITH DIFFERENTIAL/PLATELET
Basophils Absolute: 0 10*3/uL (ref 0.0–0.2)
Basos: 1 %
EOS (ABSOLUTE): 0.1 10*3/uL (ref 0.0–0.4)
Eos: 3 %
Hematocrit: 41.4 % (ref 34.0–46.6)
Hemoglobin: 13.8 g/dL (ref 11.1–15.9)
Immature Grans (Abs): 0 10*3/uL (ref 0.0–0.1)
Immature Granulocytes: 0 %
Lymphocytes Absolute: 1.2 10*3/uL (ref 0.7–3.1)
Lymphs: 29 %
MCH: 31.3 pg (ref 26.6–33.0)
MCHC: 33.3 g/dL (ref 31.5–35.7)
MCV: 94 fL (ref 79–97)
Monocytes Absolute: 0.4 10*3/uL (ref 0.1–0.9)
Monocytes: 9 %
Neutrophils Absolute: 2.5 10*3/uL (ref 1.4–7.0)
Neutrophils: 58 %
Platelets: 180 10*3/uL (ref 150–450)
RBC: 4.41 x10E6/uL (ref 3.77–5.28)
RDW: 13.4 % (ref 11.7–15.4)
WBC: 4.3 10*3/uL (ref 3.4–10.8)

## 2019-01-10 MED ORDER — LEVOTHYROXINE SODIUM 112 MCG PO TABS: 112.0000 ug | ORAL_TABLET | Freq: Every day | ORAL | 3 refills | Status: DC

## 2019-01-14 ENCOUNTER — Telehealth: Payer: Self-pay | Admitting: Internal Medicine

## 2019-01-14 ENCOUNTER — Other Ambulatory Visit: Payer: Self-pay | Admitting: Endocrinology

## 2019-01-14 LAB — VITAMIN B6: Vitamin B6: 19.6 ug/L (ref 2.0–32.8)

## 2019-01-14 LAB — TSH: TSH: 0.241 u[IU]/mL — ABNORMAL LOW (ref 0.450–4.500)

## 2019-01-14 LAB — VITAMIN B12: Vitamin B-12: 770 pg/mL (ref 232–1245)

## 2019-01-14 LAB — T4, FREE: Free T4: 1.57 ng/dL (ref 0.82–1.77)

## 2019-01-14 MED ORDER — LEVOTHYROXINE SODIUM 100 MCG PO TABS: 100.0000 ug | ORAL_TABLET | Freq: Every day | ORAL | 3 refills | Status: DC

## 2019-01-14 NOTE — Telephone Encounter (Signed)
Returned call to patient no answer.LMTC. 

## 2019-01-14 NOTE — Telephone Encounter (Signed)
New Message   Patient states she was told to stop taking her Metoprolol 01/09/19 and wants to know if she continue not using the medication or should she start taking it again.

## 2019-01-16 NOTE — Telephone Encounter (Signed)
Returned call to pt she states that she got the answers tha she needed via [employer]. She will continue to hold metoprolol and will arrive "a little early" for her cath that is scheduled.

## 2019-01-19 ENCOUNTER — Telehealth: Payer: Self-pay | Admitting: *Deleted

## 2019-01-19 NOTE — Telephone Encounter (Signed)
Pt contacted pre-catheterization scheduled at [facility] for: Tuesday January 20, 2019 12 noon Verified arrival time and place: [place] Entrance A at: 7 AM-pre procedure hydration  No solid food after midnight prior to cath, clear liquids until 5 AM day of procedure. Contrast allergy: no  Hold: Metformin-day of procedure and 48 hours post procedure. Prandin-AM of procedure. Pt reports not currently taking Invokana and Trulicity.   Except hold medications AM meds can be  taken pre-cath with sip of water including: ASA 81 mg  Please increase your intake of fluids (water is best) to hydrate before the catheterization.  Confirmed patient has responsible person to drive home post procedure and observe 24 hours after arriving home: yes

## 2019-01-20 ENCOUNTER — Encounter (HOSPITAL_COMMUNITY): Payer: Self-pay | Admitting: Interventional Cardiology

## 2019-01-20 ENCOUNTER — Other Ambulatory Visit: Payer: Self-pay

## 2019-01-20 ENCOUNTER — Encounter (HOSPITAL_COMMUNITY)
Admission: RE | Disposition: A | Discharge status: Home / Self Care | Payer: Self-pay | Source: Home / Self Care | Attending: Interventional Cardiology

## 2019-01-20 ENCOUNTER — Ambulatory Visit (HOSPITAL_COMMUNITY)
Admission: RE | Admit: 2019-01-20 | Discharge: 2019-01-20 | Disposition: A | Discharge status: Home / Self Care | Payer: 59 | Attending: Interventional Cardiology | Admitting: Interventional Cardiology

## 2019-01-20 DIAGNOSIS — Z7984 Long term (current) use of oral hypoglycemic drugs: Secondary | ICD-10-CM | POA: Insufficient documentation

## 2019-01-20 DIAGNOSIS — Z886 Allergy status to analgesic agent status: Secondary | ICD-10-CM | POA: Diagnosis not present

## 2019-01-20 DIAGNOSIS — E785 Hyperlipidemia, unspecified: Secondary | ICD-10-CM | POA: Insufficient documentation

## 2019-01-20 DIAGNOSIS — I10 Essential (primary) hypertension: Secondary | ICD-10-CM | POA: Diagnosis not present

## 2019-01-20 DIAGNOSIS — Z888 Allergy status to other drugs, medicaments and biological substances status: Secondary | ICD-10-CM | POA: Insufficient documentation

## 2019-01-20 DIAGNOSIS — E1169 Type 2 diabetes mellitus with other specified complication: Secondary | ICD-10-CM | POA: Diagnosis present

## 2019-01-20 DIAGNOSIS — I2584 Coronary atherosclerosis due to calcified coronary lesion: Secondary | ICD-10-CM | POA: Insufficient documentation

## 2019-01-20 DIAGNOSIS — Z90710 Acquired absence of both cervix and uterus: Secondary | ICD-10-CM | POA: Insufficient documentation

## 2019-01-20 DIAGNOSIS — Z79899 Other long term (current) drug therapy: Secondary | ICD-10-CM | POA: Diagnosis not present

## 2019-01-20 DIAGNOSIS — Z8249 Family history of ischemic heart disease and other diseases of the circulatory system: Secondary | ICD-10-CM | POA: Diagnosis not present

## 2019-01-20 DIAGNOSIS — Z8262 Family history of osteoporosis: Secondary | ICD-10-CM | POA: Insufficient documentation

## 2019-01-20 DIAGNOSIS — Z881 Allergy status to other antibiotic agents status: Secondary | ICD-10-CM | POA: Diagnosis not present

## 2019-01-20 DIAGNOSIS — Z87891 Personal history of nicotine dependence: Secondary | ICD-10-CM | POA: Diagnosis not present

## 2019-01-20 DIAGNOSIS — E1140 Type 2 diabetes mellitus with diabetic neuropathy, unspecified: Secondary | ICD-10-CM | POA: Diagnosis present

## 2019-01-20 DIAGNOSIS — Z6823 Body mass index (BMI) 23.0-23.9, adult: Secondary | ICD-10-CM | POA: Diagnosis not present

## 2019-01-20 DIAGNOSIS — Z833 Family history of diabetes mellitus: Secondary | ICD-10-CM | POA: Insufficient documentation

## 2019-01-20 DIAGNOSIS — I25118 Atherosclerotic heart disease of native coronary artery with other forms of angina pectoris: Secondary | ICD-10-CM | POA: Insufficient documentation

## 2019-01-20 DIAGNOSIS — I2510 Atherosclerotic heart disease of native coronary artery without angina pectoris: Secondary | ICD-10-CM

## 2019-01-20 DIAGNOSIS — H269 Unspecified cataract: Secondary | ICD-10-CM | POA: Diagnosis not present

## 2019-01-20 DIAGNOSIS — K219 Gastro-esophageal reflux disease without esophagitis: Secondary | ICD-10-CM | POA: Insufficient documentation

## 2019-01-20 DIAGNOSIS — E669 Obesity, unspecified: Secondary | ICD-10-CM | POA: Insufficient documentation

## 2019-01-20 DIAGNOSIS — Z9884 Bariatric surgery status: Secondary | ICD-10-CM | POA: Diagnosis not present

## 2019-01-20 DIAGNOSIS — M1990 Unspecified osteoarthritis, unspecified site: Secondary | ICD-10-CM | POA: Insufficient documentation

## 2019-01-20 HISTORY — PX: LEFT HEART CATH AND CORONARY ANGIOGRAPHY: CATH118249

## 2019-01-20 LAB — GLUCOSE, CAPILLARY: Glucose-Capillary: 101 mg/dL — ABNORMAL HIGH (ref 70–99)

## 2019-01-20 SURGERY — LEFT HEART CATH AND CORONARY ANGIOGRAPHY
Anesthesia: LOCAL

## 2019-01-20 MED ORDER — LIDOCAINE HCL (PF) 1 % IJ SOLN
INTRAMUSCULAR | Status: DC | PRN
  Administered 2019-01-20: 2 mL

## 2019-01-20 MED ORDER — HEPARIN (PORCINE) IN NACL 1000-0.9 UT/500ML-% IV SOLN
INTRAVENOUS | Status: AC
  Filled 2019-01-20: qty 1000

## 2019-01-20 MED ORDER — MIDAZOLAM HCL 2 MG/2ML IJ SOLN
INTRAMUSCULAR | Status: AC
  Filled 2019-01-20: qty 2

## 2019-01-20 MED ORDER — FENTANYL CITRATE (PF) 100 MCG/2ML IJ SOLN
INTRAMUSCULAR | Status: DC | PRN
  Administered 2019-01-20: 25 ug via INTRAVENOUS

## 2019-01-20 MED ORDER — MIDAZOLAM HCL 2 MG/2ML IJ SOLN
INTRAMUSCULAR | Status: DC | PRN
  Administered 2019-01-20: 1 mg via INTRAVENOUS

## 2019-01-20 MED ORDER — SODIUM CHLORIDE 0.9% FLUSH: 3.0000 mL | INTRAVENOUS | Status: DC | PRN

## 2019-01-20 MED ORDER — SODIUM CHLORIDE 0.9% FLUSH: 3.0000 mL | Freq: Two times a day (BID) | INTRAVENOUS | Status: DC

## 2019-01-20 MED ORDER — IOHEXOL 350 MG/ML SOLN
INTRAVENOUS | Status: DC | PRN
  Administered 2019-01-20: 100 mL via INTRACARDIAC

## 2019-01-20 MED ORDER — NITROGLYCERIN 1 MG/10 ML FOR IR/CATH LAB
INTRA_ARTERIAL | Status: DC | PRN
  Administered 2019-01-20: 200 ug via INTRACORONARY

## 2019-01-20 MED ORDER — SODIUM CHLORIDE 0.9 % WEIGHT BASED INFUSION: 1.0000 mL/kg/h | INTRAVENOUS | Status: DC

## 2019-01-20 MED ORDER — HEPARIN SODIUM (PORCINE) 1000 UNIT/ML IJ SOLN
INTRAMUSCULAR | Status: AC
  Filled 2019-01-20: qty 1

## 2019-01-20 MED ORDER — OXYCODONE HCL 5 MG PO TABS: 5.0000 mg | ORAL_TABLET | ORAL | Status: DC | PRN

## 2019-01-20 MED ORDER — SODIUM CHLORIDE 0.9 % WEIGHT BASED INFUSION
3.0000 mL/kg/h | INTRAVENOUS | Status: AC
  Administered 2019-01-20: 3 mL/kg/h via INTRAVENOUS

## 2019-01-20 MED ORDER — ONDANSETRON HCL 4 MG/2ML IJ SOLN: 4.0000 mg | Freq: Four times a day (QID) | INTRAMUSCULAR | Status: DC | PRN

## 2019-01-20 MED ORDER — ACETAMINOPHEN 325 MG PO TABS: 650.0000 mg | ORAL_TABLET | ORAL | Status: DC | PRN

## 2019-01-20 MED ORDER — SODIUM CHLORIDE 0.9 % IV SOLN: 250.0000 mL | INTRAVENOUS | Status: DC | PRN

## 2019-01-20 MED ORDER — SODIUM CHLORIDE 0.9 % IV SOLN: INTRAVENOUS | Status: DC

## 2019-01-20 MED ORDER — HEPARIN SODIUM (PORCINE) 1000 UNIT/ML IJ SOLN
INTRAMUSCULAR | Status: DC | PRN
  Administered 2019-01-20: 4000 [IU] via INTRAVENOUS

## 2019-01-20 MED ORDER — VERAPAMIL HCL 2.5 MG/ML IV SOLN
INTRAVENOUS | Status: DC | PRN
  Administered 2019-01-20: 10 mL via INTRA_ARTERIAL

## 2019-01-20 MED ORDER — ASPIRIN 81 MG PO CHEW: 81.0000 mg | CHEWABLE_TABLET | ORAL | Status: DC

## 2019-01-20 MED ORDER — HEPARIN (PORCINE) IN NACL 1000-0.9 UT/500ML-% IV SOLN
INTRAVENOUS | Status: DC | PRN
  Administered 2019-01-20 (×2): 500 mL

## 2019-01-20 MED ORDER — LIDOCAINE HCL (PF) 1 % IJ SOLN
INTRAMUSCULAR | Status: AC
  Filled 2019-01-20: qty 30

## 2019-01-20 MED ORDER — VERAPAMIL HCL 2.5 MG/ML IV SOLN
INTRAVENOUS | Status: AC
  Filled 2019-01-20: qty 2

## 2019-01-20 MED ORDER — NITROGLYCERIN 1 MG/10 ML FOR IR/CATH LAB
INTRA_ARTERIAL | Status: AC
  Filled 2019-01-20: qty 10

## 2019-01-20 MED ORDER — SODIUM CHLORIDE 0.9 % IV SOLN
INTRAVENOUS | Status: AC | PRN
  Administered 2019-01-20: 500 mL via INTRAVENOUS

## 2019-01-20 MED ORDER — FENTANYL CITRATE (PF) 100 MCG/2ML IJ SOLN
INTRAMUSCULAR | Status: AC
  Filled 2019-01-20: qty 2

## 2019-01-20 SURGICAL SUPPLY — 10 items
CATH 5FR JL3.5 JR4 ANG PIG MP (CATHETERS) ×2 IMPLANT
DEVICE RAD TR BAND REGULAR (VASCULAR PRODUCTS) ×2 IMPLANT
GLIDESHEATH SLEND A-KIT 6F 22G (SHEATH) ×2 IMPLANT
GUIDEWIRE INQWIRE 1.5J.035X260 (WIRE) ×1 IMPLANT
INQWIRE 1.5J .035X260CM (WIRE) ×2
KIT HEART LEFT (KITS) ×2 IMPLANT
PACK CARDIAC CATHETERIZATION (CUSTOM PROCEDURE TRAY) ×2 IMPLANT
SHEATH PROBE COVER 6X72 (BAG) ×2 IMPLANT
TRANSDUCER W/STOPCOCK (MISCELLANEOUS) ×2 IMPLANT
TUBING CIL FLEX 10 FLL-RA (TUBING) ×2 IMPLANT

## 2019-01-20 NOTE — Addendum Note (Signed)
Addended by: Cherlynn Kaiser A on: 01/20/2019 02:04 AM   Modules accepted: Orders, SmartSet

## 2019-01-20 NOTE — Interval H&P Note (Signed)
Cath Lab Visit (complete for each Cath Lab visit)  Clinical Evaluation Leading to the Procedure:   ACS: No.  Non-ACS:    Anginal Classification: CCS [name]  Anti-ischemic medical therapy: Minimal Therapy (1 class of medications)  Non-Invasive Test Results: Intermediate-risk stress test findings: cardiac mortality 1-3%/year  Prior CABG: No previous CABG      History and Physical Interval Note:  01/20/2019 9:02 AM  Savannah [name]  has presented today for surgery, with the diagnosis of Coronary Artery Disease.  The various methods of treatment have been discussed with the patient and family. After consideration of risks, benefits and other options for treatment, the patient has consented to  Procedure(s): LEFT HEART CATH AND CORONARY ANGIOGRAPHY (N/A) as a surgical intervention.  The patient's history has been reviewed, patient examined, no change in status, stable for surgery.  I have reviewed the patient's chart and labs.  Questions were answered to the patient's satisfaction.     Savannah [name]

## 2019-01-20 NOTE — Discharge Instructions (Signed)
Radial Site Care ° °This sheet gives you information about how to care for yourself after your procedure. Your health care provider may also give you more specific instructions. If you have problems or questions, contact your health care provider. °What can I expect after the procedure? °After the procedure, it is common to have: °· Bruising and tenderness at the catheter insertion area. °Follow these instructions at home: °Medicines °· Take over-the-counter and prescription medicines only as told by your health care provider. °Insertion site care °· Follow instructions from your health care provider about how to take care of your insertion site. Make sure you: °? Wash your hands with soap and water before you change your bandage (dressing). If soap and water are not available, use hand sanitizer. °? Change your dressing as told by your health care provider. °? Leave stitches (sutures), skin glue, or adhesive strips in place. These skin closures may need to stay in place for 2 weeks or longer. If adhesive strip edges start to loosen and curl up, you may trim the loose edges. Do not remove adhesive strips completely unless your health care provider tells you to do that. °· Check your insertion site every day for signs of infection. Check for: °? Redness, swelling, or pain. °? Fluid or blood. °? Pus or a bad smell. °? Warmth. °· Do not take baths, swim, or use a hot tub until your health care provider approves. °· You may shower 24-48 hours after the procedure, or as directed by your health care provider. °? Remove the dressing and gently wash the site with plain soap and water. °? Pat the area dry with a clean towel. °? Do not rub the site. That could cause bleeding. °· Do not apply powder or lotion to the site. °Activity ° °· For 24 hours after the procedure, or as directed by your health care provider: °? Do not flex or bend the affected arm. °? Do not push or pull heavy objects with the affected arm. °? Do not  drive yourself home from the hospital or clinic. You may drive 24 hours after the procedure unless your health care provider tells you not to. °? Do not operate machinery or power tools. °· Do not lift anything that is heavier than 10 lb (4.5 kg), or the limit that you are told, until your health care provider says that it is safe. °· Ask your health care provider when it is okay to: °? Return to work or school. °? Resume usual physical activities or sports. °? Resume sexual activity. °General instructions °· If the catheter site starts to bleed, raise your arm and put firm pressure on the site. If the bleeding does not stop, get help right away. This is a medical emergency. °· If you went home on the same day as your procedure, a responsible adult should be with you for the first 24 hours after you arrive home. °· Keep all follow-up visits as told by your health care provider. This is important. °Contact a health care provider if: °· You have a fever. °· You have redness, swelling, or yellow drainage around your insertion site. °Get help right away if: °· You have unusual pain at the radial site. °· The catheter insertion area swells very fast. °· The insertion area is bleeding, and the bleeding does not stop when you hold steady pressure on the area. °· Your arm or hand becomes pale, cool, tingly, or numb. °These symptoms may represent a serious problem   that is an emergency. Do not wait to see if the symptoms will go away. Get medical help right away. Call your local emergency services (911 in the [place] Do not drive yourself to the hospital. °Summary °· After the procedure, it is common to have bruising and tenderness at the site. °· Follow instructions from your health care provider about how to take care of your radial site wound. Check the wound every day for signs of infection. °· Do not lift anything that is heavier than 10 lb (4.5 kg), or the limit that you are told, until your health care provider says  that it is safe. °This information is not intended to replace advice given to you by your health care provider. Make sure you discuss any questions you have with your health care provider. °Document Released: 12/01/2010 Document Revised: 12/04/2017 Document Reviewed: 12/04/2017 °Elsevier Interactive Patient Education © 2019 [employer] ° °

## 2019-01-23 ENCOUNTER — Telehealth: Payer: Self-pay | Admitting: Internal Medicine

## 2019-01-23 NOTE — Telephone Encounter (Signed)
New Message   1. What dental office are you calling from? [facility]   2. What is your office phone number?  [phone]  3. What is your fax number? [phone]   4. What type of procedure is the patient having performed? Dental Cleaning and Xray   5. What date is procedure scheduled or is the patient there now? 01/23/2019  (if the patient is at the dentist's office question goes to their cardiologist if he/she is in the office.  If not, question should go to the DOD).   6. What is your question (ex. Antibiotics prior to procedure, holding medication-we need to know how long dentist wants pt to hold med)?  Can the patient have the services after having a heart cath

## 2019-01-23 NOTE — Telephone Encounter (Signed)
Spoke with nurse at dentist office, as per Dr Percival Spanish pt is ok to have dental cleaning and xray

## 2019-01-30 NOTE — Research (Signed)
CADFEM Informed Consent                  Subject Name:   Savannah [name]   Subject met inclusion and exclusion criteria.  The informed consent form, study requirements and expectations were reviewed with the subject and questions and concerns were addressed prior to the signing of the consent form.  The subject verbalized understanding of the trial requirements.  The subject agreed to participate in the CADFEM trial and signed the informed consent.  The informed consent was obtained prior to performance of any protocol-specific procedures for the subject.  A copy of the signed informed consent was given to the subject and a copy was placed in the subject's medical record. This patient was consented by Star Age on 10-30-18 at 7:03 a.m.   [place] Lindsie Simar, Research Assistant 10-30-18 7:03 a.m.

## 2019-02-06 ENCOUNTER — Other Ambulatory Visit: Payer: Self-pay

## 2019-02-09 ENCOUNTER — Other Ambulatory Visit: Payer: Self-pay

## 2019-02-09 ENCOUNTER — Ambulatory Visit (INDEPENDENT_AMBULATORY_CARE_PROVIDER_SITE_OTHER): Payer: 59 | Admitting: Endocrinology

## 2019-02-09 DIAGNOSIS — E1140 Type 2 diabetes mellitus with diabetic neuropathy, unspecified: Secondary | ICD-10-CM

## 2019-02-09 MED ORDER — LEVOTHYROXINE SODIUM 100 MCG PO TABS: 112.0000 ug | ORAL_TABLET | Freq: Every day | ORAL | 3 refills | Status: DC

## 2019-02-09 NOTE — Progress Notes (Addendum)
Subjective:    Patient ID: Savannah [name], female    DOB: [birth year], 63 y.o.   MRN: [id number]  HPI  telehealth visit today via doxy video visit.  Alternatives to telehealth are presented to this patient, and the patient agrees to the telehealth visit. Pt is advised of the cost of the visit, and agrees to this, also.   Patient is at home, and I am at the office.   Pt returns for f/u of diabetes mellitus:  DM type: 2 Dx'ed: 3382 Complications: painful polyneuropathy, and CAD (seen on CT).   Therapy: 4 oral meds GDM: never DKA: never Severe hypoglycemia: never.   Pancreatitis: never.  Other: she took insulin from 2004-2011, when she had gastric bypass surgery.   Interval history: she takes meds as rx'ed, except for the invokana.  She says cbg have increased to the high-100's.  She has slight swelling of the ankles, and slight assoc sob.  She says exercise is good.  At last ov, she reduced to 112 mcg/d Past Medical History:  Diagnosis Date  . ABDOMINAL PAIN, CHRONIC 10/20/2009  . Acute cystitis 08/17/2009  . ALLERGIC RHINITIS 11/21/2007  . Allergy   . ASYMPTOMATIC POSTMENOPAUSAL STATUS 09/29/2008  . Bariatric surgery status 07/07/2010  . Bariatric surgery status 07/07/2010   Qualifier: Diagnosis of  By: Loanne Drilling MD, Jacelyn Pi   . Cancer ([place])    uterine  . Cataract   . DEPRESSION 11/21/2007  . DIABETES MELLITUS, TYPE II 07/08/2007  . DYSPNEA 05/20/2009  . Edema 05/20/2009  . Eosinophilic esophagitis 5/0/5397  . FEVER UNSPECIFIED 10/20/2009  . FEVER, HX OF 03/09/2010  . GERD (gastroesophageal reflux disease)   . Headache(784.0) 02/06/2010  . Hepatomegaly 01/06/2008  . HYPERLIPIDEMIA 07/08/2007  . HYPERTENSION 07/08/2007  . LEUKOPENIA, MILD 09/29/2008  . OBESITY 01/06/2008  . OSTEOARTHRITIS 01/06/2008  . Other chronic nonalcoholic liver disease 6/73/4193  . PERIPHERAL NEUROPATHY 11/21/2007  . Peripheral neuropathy   . Postsurgical hypothyroidism 09/19/2010  . SINUSITIS- ACUTE-NOS 11/21/2007  . TB  SKIN TEST, POSITIVE 02/06/2010  . THYROID NODULE 03/09/2010    Past Surgical History:  Procedure Laterality Date  . ABDOMINAL HYSTERECTOMY    . BASAL CELL CARCINOMA EXCISION    . COLONOSCOPY    . EYE SURGERY Bilateral 08/2018, 09/2018  . KNEE ARTHROSCOPY Left   . LEFT HEART CATH AND CORONARY ANGIOGRAPHY N/A 01/20/2019   Procedure: LEFT HEART CATH AND CORONARY ANGIOGRAPHY;  Surgeon: Belva Crome, MD;  Location: [facility];  Service: Cardiovascular;  Laterality: N/A;  . LYMPH NODE DISSECTION    . OOPHORECTOMY    . Right total knee replacement    . ROUX-EN-Y GASTRIC BYPASS  2011  . SKIN TAG REMOVAL    . THYROIDECTOMY    . TONSILLECTOMY AND ADENOIDECTOMY    . UPPER GASTROINTESTINAL ENDOSCOPY      Social History   Socioeconomic History  . Marital status: Single    Spouse name: Not on file  . Number of children: 0  . Years of education: Not on file  . Highest education level: Not on file  Occupational History  . Occupation: Optometrist  Social Needs  . Financial resource strain: Not on file  . Food insecurity:    Worry: Not on file    Inability: Not on file  . Transportation needs:    Medical: Not on file    Non-medical: Not on file  Tobacco Use  . Smoking status: Former Smoker    Packs/day:  1.50    Years: 15.00    Pack years: 22.50    Types: Cigarettes  . Smokeless tobacco: Never Used  Substance and Sexual Activity  . Alcohol use: Yes    Alcohol/week: 0.0 standard drinks    Comment: rarely  . Drug use: Never  . Sexual activity: Not on file  Lifestyle  . Physical activity:    Days per week: Not on file    Minutes per session: Not on file  . Stress: Not on file  Relationships  . Social connections:    Talks on phone: Not on file    Gets together: Not on file    Attends religious service: Not on file    Active member of club or organization: Not on file    Attends meetings of clubs or organizations: Not on file    Relationship status: Not on file  .  Intimate partner violence:    Fear of current or ex partner: Not on file    Emotionally abused: Not on file    Physically abused: Not on file    Forced sexual activity: Not on file  Other Topics Concern  . Not on file  Social History Narrative  . Not on file    Current Outpatient Medications on File Prior to Visit  Medication Sig Dispense Refill  . aspirin EC 81 MG tablet Take 1 tablet (81 mg total) by mouth daily. Starting 01/18/19 prior to cardiac cath    . atorvastatin (LIPITOR) 40 MG tablet Take 1 tablet (40 mg total) by mouth daily. (Patient taking differently: Take 40 mg by mouth at bedtime. ) 90 tablet 3  . calcium carbonate (TUMS - DOSED IN MG ELEMENTAL CALCIUM) 500 MG chewable tablet Chew 2 tablets by mouth daily as needed (cramps).    . canagliflozin (INVOKANA) 300 MG TABS tablet Take 300 mg by mouth daily.    . Cholecalciferol (VITAMIN D3) 125 MCG (5000 UT) CAPS Take 5,000 Units by mouth daily.    . clobetasol cream (TEMOVATE) 2.35 % Apply 1 application topically 2 (two) times daily as needed (lichen sclerosus).     . Coenzyme Q10 (COQ-10) 400 MG CAPS Take 400 mg by mouth daily.    . diclofenac sodium (VOLTAREN) 1 % GEL Apply 4 g topically 4 (four) times daily. (Patient taking differently: Apply 4 g topically 4 (four) times daily as needed (foot pain). ) 100 g 11  . docusate sodium (COLACE) 100 MG capsule Take 200 mg by mouth 2 (two) times daily.    . fluconazole (DIFLUCAN) 150 MG tablet Take 150 mg by mouth See admin instructions. Take 150 mg every other day for 8 days after antibiotics    . gabapentin (NEURONTIN) 300 MG capsule TAKE 2 CAPSULES BY MOUTH TWICE A DAY (Patient taking differently: Take 600 mg by mouth 2 (two) times daily. ) 360 capsule 2  . halobetasol (ULTRAVATE) 0.05 % cream Apply 1 application topically 2 (two) times daily as needed (lichen sclerosus).   2  . HYDROcodone-acetaminophen (NORCO/VICODIN) 5-325 MG tablet Take 1 tablet by mouth daily as needed for  moderate pain or severe pain.    . hydrOXYzine (VISTARIL) 25 MG capsule Take 50 mg by mouth at bedtime.    [name] loratadine (CLARITIN) 10 MG tablet Take 10 mg by mouth daily.    [name] MAGNESIUM PO Take 1 tablet by mouth daily as needed (cramps).     . metFORMIN (GLUCOPHAGE-XR) 500 MG 24 hr tablet Take 2 tablets (1,000 mg  total) by mouth 2 (two) times daily. 360 tablet 0  . metoprolol tartrate (LOPRESSOR) 25 MG tablet Take 12.5 mg by mouth 2 (two) times daily.    [name] MITIGARE 0.6 MG CAPS TAKE 1 CAPSULE BY MOUTH TWICE A DAY (Patient taking differently: Take 0.6 mg by mouth 2 (two) times daily. ) 60 capsule 2  . Multiple Vitamin (MULTIVITAMIN) tablet Take 1 tablet by mouth 2 (two) times daily.     . Multiple Vitamins-Minerals (OCUVITE PO) Take 1 tablet by mouth daily.     . nitroGLYCERIN (NITROSTAT) 0.4 MG SL tablet Place 1 tablet (0.4 mg total) under the tongue every 5 (five) minutes as needed. (Patient taking differently: Place 0.4 mg under the tongue every 5 (five) minutes as needed for chest pain. ) 25 tablet 3  . ONE TOUCH ULTRA TEST test strip USE TO CHECK BLOOD SUGAR 1 TIME PER DAY. 100 each 5  . pantoprazole (PROTONIX) 40 MG tablet Take 1 tablet (40 mg total) by mouth daily. 30 tablet 3  . phenazopyridine (PYRIDIUM) 100 MG tablet Take 100 mg by mouth 3 (three) times daily as needed for pain.    . Probiotic CAPS Take 1 capsule by mouth daily.    . repaglinide (PRANDIN) 2 MG tablet TAKE 1 TABLET BY MOUTH 3 TIMES A DAY BEFORE MEALS (Patient taking differently: Take 2 mg by mouth 2 (two) times daily. ) 270 tablet 1  . Sulfamethoxazole-Trimethoprim (BACTRIM PO) Take 1 tablet by mouth 2 (two) times daily.    [name] topiramate (TOPAMAX) 50 MG tablet Take 100 mg by mouth 2 (two) times daily.    . traMADol (ULTRAM) 50 MG tablet Take 50 mg by mouth 2 (two) times daily as needed for moderate pain.    . TRULICITY 1.5 ZJ/6.9CV SOPN INJECT 1.5 MG INTO THE SKIN ONCE A WEEK. (Patient taking differently: Inject 1.5 mg as  directed every Sunday. ) 6 pen 11  . Turmeric 500 MG CAPS Take 500 mg by mouth daily.     No current facility-administered medications on file prior to visit.     Allergies  Allergen Reactions  . Ace Inhibitors Cough  . Caffeine     PVCs  . Ciprofloxacin Itching  . Mucinex [Guaifenesin Er]     PVCs  . Nsaids     Gastric Bypass Surgery - unable to take, tolerates ec 81 aspirin   . Other     Antihistamine-alkylamine - PVCs tolerates benadryl     Family History  Problem Relation Age of Onset  . Multiple sclerosis Sister   . Breast cancer Sister        breast onset age 104  . Osteoporosis Sister   . Diabetes Mother   . Diabetes Father   . Congestive Heart Failure Father   . Hypertension Father   . Myelodysplastic syndrome Father   . Liver cancer Maternal Aunt   . Breast cancer Maternal Aunt   . Diabetes Maternal Aunt   . Colon cancer Maternal Uncle   . Diabetes Maternal Uncle   . Diabetes Maternal Grandmother   . Diabetes Paternal Grandmother       Review of Systems She denies hypoglycemia.  No weight change.     Objective:   Physical Exam  Lab Results  Component Value Date   TSH 0.241 (L) 01/09/2019    Lab Results  Component Value Date   HGBA1C 6.9 (A) 01/09/2019       Assessment & Plan:  Hypothyroidism: we discussed.  As she decreased synthroid to 112/d just 1 month ago, we'll continue for now Type 2 DM: worse off invokana.  Patient Instructions  Please resume the invokana:   Please continue the same other diabetes medications.   check your blood sugar once a day.  vary the time of day when you check, between before the 3 meals, and at bedtime.  also check if you have symptoms of your blood sugar being too high or too low.  please keep a record of the readings and bring it to your next appointment here (or you can bring the meter itself).  You can write it on any piece of paper.  please call us sooner if your blood sugar goes below 70, or if you have a  lot of readings over 200. Please come back for a follow-up appointment in 3 months.

## 2019-02-09 NOTE — Patient Instructions (Addendum)
Please resume the invokana:   Please continue the same other diabetes medications.   check your blood sugar once a day.  vary the time of day when you check, between before the 3 meals, and at bedtime.  also check if you have symptoms of your blood sugar being too high or too low.  please keep a record of the readings and bring it to your next appointment here (or you can bring the meter itself).  You can write it on any piece of paper.  please call us sooner if your blood sugar goes below 70, or if you have a lot of readings over 200. Please come back for a follow-up appointment in 3 months.

## 2019-02-11 ENCOUNTER — Other Ambulatory Visit: Payer: Self-pay | Admitting: Rheumatology

## 2019-02-11 NOTE — Telephone Encounter (Signed)
Last Visit: 10/17/2018 Next Visit: message sent to the front desk to schedule.  Labs: 01/09/2019 creat 1.29 prev 0.85, GFR 44 prev 73  Okay to refill mitigare?

## 2019-02-11 NOTE — Telephone Encounter (Signed)
I am uncertain about the etiology of low GFR.  I would recommend repeating BMP with GFR.  Based on her current GFR she can only take Mitigare1 capsule a day.  If patient wishes to have lab prior to the refill and the GFR is back to normal then we can give her twice daily dose.

## 2019-02-11 NOTE — Telephone Encounter (Signed)
Patient would like to discuss meds. She is scheduled for a telemedicine visit 02/12/2019 3:15pm

## 2019-02-12 ENCOUNTER — Telehealth (INDEPENDENT_AMBULATORY_CARE_PROVIDER_SITE_OTHER): Payer: 59 | Admitting: Rheumatology

## 2019-02-12 ENCOUNTER — Telehealth: Payer: Self-pay | Admitting: *Deleted

## 2019-02-12 ENCOUNTER — Encounter: Payer: Self-pay | Admitting: Rheumatology

## 2019-02-12 DIAGNOSIS — M19042 Primary osteoarthritis, left hand: Secondary | ICD-10-CM

## 2019-02-12 DIAGNOSIS — M1990 Unspecified osteoarthritis, unspecified site: Secondary | ICD-10-CM

## 2019-02-12 DIAGNOSIS — M19071 Primary osteoarthritis, right ankle and foot: Secondary | ICD-10-CM

## 2019-02-12 DIAGNOSIS — R7611 Nonspecific reaction to tuberculin skin test without active tuberculosis: Secondary | ICD-10-CM

## 2019-02-12 DIAGNOSIS — R5383 Other fatigue: Secondary | ICD-10-CM

## 2019-02-12 DIAGNOSIS — Z8542 Personal history of malignant neoplasm of other parts of uterus: Secondary | ICD-10-CM

## 2019-02-12 DIAGNOSIS — Z96651 Presence of right artificial knee joint: Secondary | ICD-10-CM

## 2019-02-12 DIAGNOSIS — K7581 Nonalcoholic steatohepatitis (NASH): Secondary | ICD-10-CM

## 2019-02-12 DIAGNOSIS — Z8669 Personal history of other diseases of the nervous system and sense organs: Secondary | ICD-10-CM

## 2019-02-12 DIAGNOSIS — Z9884 Bariatric surgery status: Secondary | ICD-10-CM

## 2019-02-12 DIAGNOSIS — M19041 Primary osteoarthritis, right hand: Secondary | ICD-10-CM

## 2019-02-12 DIAGNOSIS — Z8719 Personal history of other diseases of the digestive system: Secondary | ICD-10-CM

## 2019-02-12 DIAGNOSIS — K200 Eosinophilic esophagitis: Secondary | ICD-10-CM

## 2019-02-12 DIAGNOSIS — M1712 Unilateral primary osteoarthritis, left knee: Secondary | ICD-10-CM

## 2019-02-12 DIAGNOSIS — M19072 Primary osteoarthritis, left ankle and foot: Secondary | ICD-10-CM

## 2019-02-12 DIAGNOSIS — R7989 Other specified abnormal findings of blood chemistry: Secondary | ICD-10-CM

## 2019-02-12 DIAGNOSIS — E890 Postprocedural hypothyroidism: Secondary | ICD-10-CM

## 2019-02-12 DIAGNOSIS — Z8639 Personal history of other endocrine, nutritional and metabolic disease: Secondary | ICD-10-CM

## 2019-02-12 MED ORDER — COLCHICINE 0.6 MG PO CAPS: 1.0000 | ORAL_CAPSULE | Freq: Every day | ORAL | 1 refills | Status: DC

## 2019-02-12 NOTE — Progress Notes (Signed)
Virtual Visit via Video Note  I connected with Savannah [name] on 02/12/19 at  3:15 PM EDT by a video enabled telemedicine application and verified that I am speaking with the correct person using two identifiers.   I discussed the limitations of evaluation and management by telemedicine and the availability of in person appointments. The patient expressed understanding and agreed to proceed.  CC: Pain and swelling in both hands  History of Present Illness: Patient is a 63 year old female with a past medical history of inflammatory arthritis and osteoarthritis.   She has a history of lichen sclerosis, and she reports she gets a yeast infection every 3-4 months.  She states she is currently on an oral fungal that interacts with colchicine.  She states she holds her dose of colchicine while taking the diflucan.  She typically holds her dose of colchicine for 2 weeks when she is taking diflucan, and she devleops increased pain and swelling in both hands. She states when the pain is severe she has to take colchicine 0.6 mg 2 tablets po daily. She is curious if there is another medication she can take instead of colchicine.  Her kidney function has been worsening.  She is unable to take other NSAIDs.  She is planning on making an appointment with dermatologist to discuss other treatment options for lichen sclerosis/yeast infections.  She has started taking daily probiotics.  She has been walking more for exercise, which has been causing increased pain in the right knee joint that is replaced.    Review of Systems  Constitutional: Negative for fever and malaise/fatigue.  Eyes: Negative for photophobia, pain, discharge and redness.       +eye dryness  Respiratory: Negative for cough, shortness of breath and wheezing.   Cardiovascular: Positive for palpitations (Followed by cardiology). Negative for chest pain.  Gastrointestinal: Negative for blood in stool, constipation and diarrhea.  Genitourinary:  Negative for dysuria.  Musculoskeletal: Positive for joint pain. Negative for back pain, myalgias and neck pain.       +joint swelling  Skin: Negative for rash.       +mouth dryness  Neurological: Negative for dizziness and headaches.  Psychiatric/Behavioral: Negative for depression. The patient is not nervous/anxious and does not have insomnia.     Observations/Objective: Physical Exam  Constitutional: She is oriented to person, place, and time and well-developed, well-nourished, and in no distress.  HENT:  Head: Normocephalic and atraumatic.  Eyes: Conjunctivae are normal.  Pulmonary/Chest: Effort normal.  Neurological: She is alert and oriented to person, place, and time.  Psychiatric: Mood, memory, affect and judgment normal.   Patient reports morning stiffness for 30  minutes.   Patient reports nocturnal pain.  Difficulty dressing/grooming: Denies Difficulty climbing stairs: Reports Difficulty getting out of chair: Reports Difficulty using hands for taps, buttons, cutlery, and/or writing: Denies   Assessment and Plan: Inflammatory arthritis: She is having increased pain and intermittent hand swelling.  She was previously taking colchicine 0.6 mg 2 tablets po daily to prevent pseudogout flares but reduced to 1 tablet daily due to elevated creatinine-1.29 and low GFR-44 on 01/09/19.  She reports she recently had imaging that required contrast dye and she states before her lab work she was having diarrhea and was very dehydrated. She She was advised to repeat BMP in 3-4 weeks.  Future order will be placed today. She will continue on colchicine 0.6 mg 1 tablet po daily.  A refill will be sent to the pharmacy.  If her creatinine and GFR improve, we will consider increasing her dose back to colchicine 0.6 mg 2 tablets daily.  She will follow up in 6 months.  Primary osteoarthritis of both hands: She is having increased pain and intermittent joint swelling.  She takes colchicine 0.6 mg 2  tablets po daily.  She was advised to reduce colchicine to 1 tablet daily due to low GFR.  Primary osteoarthritis of left knee:  She has occasional discomfort in the left knee joint.  No joint swelling.  Status post total right knee replacement: She is having increased right knee joint pain. No joint swelling. She has been walking for exercise.  She has difficulty going up and down steps.  She is working on lower extremity muscle strengthening.   Primary osteoarthritis of both feet: She has no feet pain at this time.  Follow Up Instructions: She will follow up in 6 months. Future order for BMP with GFR was placed today. A refill of colchicine was sent to the pharmacy.    I discussed the assessment and treatment plan with the patient. The patient was provided an opportunity to ask questions and all were answered. The patient agreed with the plan and demonstrated an understanding of the instructions.   The patient was advised to call back or seek an in-person evaluation if the symptoms worsen or if the condition fails to improve as anticipated.  I provided 57minutes of non-face-to-face time during this encounter. Bo Merino, MD   Scribed by-  Ofilia Neas, PA-C

## 2019-02-12 NOTE — Telephone Encounter (Signed)
   TELEPHONE CALL NOTE  This patient has been deemed a candidate for follow-up tele-health visit to limit community exposure during the Covid-19 pandemic. I spoke with the patient via phone to discuss instructions.The patient states she is aware how webex works.  The patient will receive a phone call 2-3 days prior to their E-Visit at which time consent will be verbally confirmed. A Virtual Office Visit appointment type has been scheduled for 9:40 am with Dr Margaretann Loveless, with "VIDEO"  in the appointment notes.  Raiford Simmonds, RN 02/12/2019 9:07 AM

## 2019-02-16 ENCOUNTER — Telehealth (INDEPENDENT_AMBULATORY_CARE_PROVIDER_SITE_OTHER): Payer: 59 | Admitting: Internal Medicine

## 2019-02-16 ENCOUNTER — Encounter: Payer: Self-pay | Admitting: Internal Medicine

## 2019-02-16 VITALS — BP 103/72 | HR 78 | Ht 71.0 in | Wt 165.0 lb

## 2019-02-16 DIAGNOSIS — E1140 Type 2 diabetes mellitus with diabetic neuropathy, unspecified: Secondary | ICD-10-CM

## 2019-02-16 DIAGNOSIS — E1169 Type 2 diabetes mellitus with other specified complication: Secondary | ICD-10-CM

## 2019-02-16 DIAGNOSIS — E785 Hyperlipidemia, unspecified: Secondary | ICD-10-CM

## 2019-02-16 DIAGNOSIS — Z9884 Bariatric surgery status: Secondary | ICD-10-CM

## 2019-02-16 DIAGNOSIS — I2510 Atherosclerotic heart disease of native coronary artery without angina pectoris: Secondary | ICD-10-CM | POA: Diagnosis not present

## 2019-02-16 DIAGNOSIS — K219 Gastro-esophageal reflux disease without esophagitis: Secondary | ICD-10-CM

## 2019-02-16 DIAGNOSIS — R079 Chest pain, unspecified: Secondary | ICD-10-CM

## 2019-02-16 NOTE — Patient Instructions (Addendum)
Medication Instructions:  Continue current medications  If you need a refill on your cardiac medications before your next appointment, please call your pharmacy.  Labwork: None Ordered   Testing/Procedures: None Ordered   Follow-Up: You will need a follow up appointment in 3 months.  Please call our office 2 months in advance to schedule this appointment.  You may see Dr Margaretann Loveless or one of the following Advanced Practice Providers on your designated Care Team:   Rosaria Ferries, PA-C . Jory Sims, DNP, ANP     At [facility], you and your health needs are our priority.  As part of our continuing mission to provide you with exceptional heart care, we have created designated Provider Care Teams.  These Care Teams include your primary Cardiologist (physician) and Advanced Practice Providers (APPs -  Physician Assistants and Nurse Practitioners) who all work together to provide you with the care you need, when you need it.  Thank you for choosing [facility] at [facility].

## 2019-02-16 NOTE — Progress Notes (Signed)
Virtual Visit via Video Note   This visit type was conducted due to national recommendations for restrictions regarding the COVID-19 Pandemic (e.g. social distancing) in an effort to limit this patient's exposure and mitigate transmission in our community.  Due to her co-morbid illnesses, this patient is at least at moderate risk for complications without adequate follow up.  This format is felt to be most appropriate for this patient at this time.  All issues noted in this document were discussed and addressed.  A limited physical exam was performed with this format.  Please refer to the patient's chart for her consent to telehealth for [facility].   Evaluation Performed:  Follow-up visit  Date:  02/16/2019   ID:  Savannah [name], DOB [birth year], MRN [id number]  Patient Location: Home  Provider Location: Home  PCP:  Vivi Barrack, MD  Cardiologist:  No primary care provider on file.  Electrophysiologist:  None   Chief Complaint:  F/u chest pain and CAD  History of Present Illness:    Savannah [name] is a 63 y.o. female who presents via audio/video conferencing for a telehealth visit today.    We spoke today about medication therapy.  She experienced an episode of dizziness in the past several weeks and when she presented to her endocrinologist office per her report she was found to be significantly orthostatic.  She discontinued metoprolol and held her Invokana.  She is now back on Invokana, but does not feel that she can tolerate metoprolol.  We discussed the indications for metoprolol for coronary artery disease.  If she is unable to tolerate this we will certainly seek alternate medication therapy.  She continues to have episodes of atypical chest pain, with less episodes of her typical sounding chest pain.  She will experience paroxysms of sharp fleeting chest pain that will last for 5 minutes to 30 minutes while sitting on the couch, and also when she walks.  She is trying to  increase her exercise capacity with walking, and has done a relatively good job in her opinion.  However she will occasionally experience sharp chest discomfort.  On 1 of her walks more recently she felt extremely fatigued and had atypical discomfort in her chest.  We reviewed her coronary angiogram results that have previously been reviewed with her by Dr. Pernell Dupre.  She has some proximal RCA disease and otherwise distal and smaller target CAD, which the latter is likely the culprit in her chest pain.  It will be ideally medically managed, but we can consider intervention if no other medical therapy is controlling symptoms.  She understands this and we will try for medical therapy first.  We have discussed PCSK9 inhibitor therapy for optimizing lipids and the potential for plaque regression.  She feels she has not had her cholesterol checked since she started atorvastatin, and would like this rechecked to see what impact it has had on her LDL.  I think this is reasonable, and can be coordinated by her primary care physician on a nonemergent basis.  We have agreed that she may be a good candidate for cardiac rehab and will benefit from supervised exercise in the setting of continued stable angina.  I will make this referral.  The patient does not have symptoms concerning for COVID-19 infection (fever, chills, cough, or new shortness of breath).    Past Medical History:  Diagnosis Date  . ABDOMINAL PAIN, CHRONIC 10/20/2009  . Acute cystitis 08/17/2009  . ALLERGIC RHINITIS 11/21/2007  .  Allergy   . ASYMPTOMATIC POSTMENOPAUSAL STATUS 09/29/2008  . Bariatric surgery status 07/07/2010  . Bariatric surgery status 07/07/2010   Qualifier: Diagnosis of  By: Loanne Drilling MD, Jacelyn Pi   . Cancer ([place])    uterine  . Cataract   . DEPRESSION 11/21/2007  . DIABETES MELLITUS, TYPE II 07/08/2007  . DYSPNEA 05/20/2009  . Edema 05/20/2009  . Eosinophilic esophagitis 07/18/2228  . FEVER UNSPECIFIED 10/20/2009  . FEVER, HX OF  03/09/2010  . GERD (gastroesophageal reflux disease)   . Headache(784.0) 02/06/2010  . Hepatomegaly 01/06/2008  . HYPERLIPIDEMIA 07/08/2007  . HYPERTENSION 07/08/2007  . LEUKOPENIA, MILD 09/29/2008  . OBESITY 01/06/2008  . OSTEOARTHRITIS 01/06/2008  . Other chronic nonalcoholic liver disease 7/98/9211  . PERIPHERAL NEUROPATHY 11/21/2007  . Peripheral neuropathy   . Postsurgical hypothyroidism 09/19/2010  . SINUSITIS- ACUTE-NOS 11/21/2007  . TB SKIN TEST, POSITIVE 02/06/2010  . THYROID NODULE 03/09/2010   Past Surgical History:  Procedure Laterality Date  . ABDOMINAL HYSTERECTOMY    . BASAL CELL CARCINOMA EXCISION    . COLONOSCOPY    . EYE SURGERY Bilateral 08/2018, 09/2018  . KNEE ARTHROSCOPY Left   . LEFT HEART CATH AND CORONARY ANGIOGRAPHY N/A 01/20/2019   Procedure: LEFT HEART CATH AND CORONARY ANGIOGRAPHY;  Surgeon: Belva Crome, MD;  Location: [place] [facility];  Service: Cardiovascular;  Laterality: N/A;  . LYMPH NODE DISSECTION    . OOPHORECTOMY    . Right total knee replacement    . ROUX-EN-Y GASTRIC BYPASS  2011  . SKIN TAG REMOVAL    . THYROIDECTOMY    . TONSILLECTOMY AND ADENOIDECTOMY    . UPPER GASTROINTESTINAL ENDOSCOPY       Current Meds  Medication Sig  . aspirin EC 81 MG tablet Take 1 tablet (81 mg total) by mouth daily. Starting 01/18/19 prior to cardiac cath  . atorvastatin (LIPITOR) 40 MG tablet Take 1 tablet (40 mg total) by mouth daily. (Patient taking differently: Take 40 mg by mouth at bedtime. )  . calcium carbonate (TUMS - DOSED IN MG ELEMENTAL CALCIUM) 500 MG chewable tablet Chew 2 tablets by mouth daily as needed (cramps).  . canagliflozin (INVOKANA) 300 MG TABS tablet Take 300 mg by mouth daily.  . Cholecalciferol (VITAMIN D3) 125 MCG (5000 UT) CAPS Take 5,000 Units by mouth daily.  . Coenzyme Q10 (COQ-10) 400 MG CAPS Take 400 mg by mouth daily.  . Colchicine (MITIGARE) 0.6 MG CAPS Take 1 capsule by mouth daily.  . diclofenac sodium (VOLTAREN) 1 % GEL Apply  4 g topically 4 (four) times daily. (Patient taking differently: Apply 4 g topically 4 (four) times daily as needed (foot pain). )  . docusate sodium (COLACE) 100 MG capsule Take 200 mg by mouth 2 (two) times daily.  [name] gabapentin (NEURONTIN) 300 MG capsule TAKE 2 CAPSULES BY MOUTH TWICE A DAY (Patient taking differently: Take 600 mg by mouth 2 (two) times daily. )  . halobetasol (ULTRAVATE) 0.05 % cream Apply 1 application topically 2 (two) times daily as needed (lichen sclerosus).   [name] HYDROcodone-acetaminophen (NORCO/VICODIN) 5-325 MG tablet Take 1 tablet by mouth daily as needed for moderate pain or severe pain.  . hydrOXYzine (VISTARIL) 25 MG capsule Take 50 mg by mouth at bedtime.  [name] levocetirizine (XYZAL ALLERGY 24HR) 5 MG tablet Take 1 tablet by mouth daily.  [name] levothyroxine (SYNTHROID, LEVOTHROID) 112 MCG tablet Take 112 mcg by mouth daily before breakfast.  . MAGNESIUM PO Take 1 tablet by mouth daily  as needed (cramps).   . metFORMIN (GLUCOPHAGE-XR) 500 MG 24 hr tablet Take 2 tablets (1,000 mg total) by mouth 2 (two) times daily.  . Multiple Vitamin (MULTIVITAMIN) tablet Take 1 tablet by mouth 2 (two) times daily.   . nitroGLYCERIN (NITROSTAT) 0.4 MG SL tablet Place 1 tablet (0.4 mg total) under the tongue every 5 (five) minutes as needed. (Patient taking differently: Place 0.4 mg under the tongue every 5 (five) minutes as needed for chest pain. )  . ONE TOUCH ULTRA TEST test strip USE TO CHECK BLOOD SUGAR 1 TIME PER DAY.  . pantoprazole (PROTONIX) 40 MG tablet Take 1 tablet (40 mg total) by mouth daily.  . phenazopyridine (PYRIDIUM) 100 MG tablet Take 100 mg by mouth 3 (three) times daily as needed for pain.  . Probiotic CAPS Take 1 capsule by mouth daily.  . repaglinide (PRANDIN) 2 MG tablet TAKE 1 TABLET BY MOUTH 3 TIMES A DAY BEFORE MEALS (Patient taking differently: Take 2 mg by mouth 2 (two) times daily. )  . topiramate (TOPAMAX) 50 MG tablet Take 100 mg by mouth 2 (two) times daily.   . traMADol (ULTRAM) 50 MG tablet Take 50 mg by mouth 2 (two) times daily as needed for moderate pain.  . TRULICITY 1.5 PF/7.9KW SOPN INJECT 1.5 MG INTO THE SKIN ONCE A WEEK. (Patient taking differently: Inject 1.5 mg as directed every Sunday. )  . Turmeric 500 MG CAPS Take 500 mg by mouth daily.  . [DISCONTINUED] clobetasol cream (TEMOVATE) 4.09 % Apply 1 application topically 2 (two) times daily as needed (lichen sclerosus).   . [DISCONTINUED] levothyroxine (SYNTHROID, LEVOTHROID) 100 MCG tablet Take 1 tablet (100 mcg total) by mouth daily before breakfast.  . [DISCONTINUED] Multiple Vitamins-Minerals (OCUVITE PO) Take 1 tablet by mouth daily.      Allergies:   Ace inhibitors; Caffeine; Ciprofloxacin; Mucinex [guaifenesin er]; Nsaids; and Other   Social History   Tobacco Use  . Smoking status: Former Smoker    Packs/day: 1.50    Years: 15.00    Pack years: 22.50    Types: Cigarettes  . Smokeless tobacco: Never Used  Substance Use Topics  . Alcohol use: Yes    Alcohol/week: 0.0 standard drinks    Comment: rarely  . Drug use: Never     Family Hx: The patient's family history includes Breast cancer in her maternal aunt and sister; Colon cancer in her maternal uncle; Congestive Heart Failure in her father; Diabetes in her father, maternal aunt, maternal grandmother, maternal uncle, mother, and paternal grandmother; Hypertension in her father; Liver cancer in her maternal aunt; Multiple sclerosis in her sister; Myelodysplastic syndrome in her father; Osteoporosis in her sister.  ROS:   Please see the history of present illness.     All other systems reviewed and are negative.   Prior CV studies:   The following studies were reviewed today:  Coronary angiogram 01/20/2019   Diffuse LAD and right coronary calcification.  Widely patent left main  Eccentric 70% proximal LAD within a calcified segment.  Distal to apical LAD is calcified and contains greater than 90% diffuse  obstruction.  Circumflex coronary artery is dominant.  The second obtuse marginal which is moderate in size contains 85% stenosis followed by 80% stenosis within a tortuous mid segment.  The circumflex is otherwise widely patent.  Right coronary is nondominant and contains proximal to mid heavy calcification with 70 to 80% stenosis.  Left ventricular function is normal.  LVEDP is normal.  RECOMMENDATIONS:   The patient has significant three-vessel coronary disease with most severe lesions involving relatively small vascular territories.  The right coronary is essentially nondominant and contains an 80% proximal stenosis that could be treated with orbital atherectomy (downstream territory is small).  The second obtuse marginal contains mid vessel significant stenosis with and tortuosity with only a small territory of potential ischemia beyond the lesions and is best treated with medical therapy.  The distal LAD is heavily calcified and diffusely involved, also best treated with medical therapy.  The proximal LAD is eccentric and is borderline significant angiographically and could be treated with orbital atherectomy and stenting if symptoms become uncontrollable.  Aggressive secondary risk prevention including LDL of 55 or less, consider adding PCSK9 therapy.  Aggressive anti-ischemic therapy  Labs/Other Tests and Data Reviewed:    EKG:  No ECG reviewed.  Recent Labs: 03/21/2018: ALT 23; Magnesium 2.2 01/09/2019: BUN 17; Creatinine, Ser 1.29; Hemoglobin 13.8; Platelets 180; Potassium 4.0; Sodium 142; TSH 0.241   Recent Lipid Panel Lab Results  Component Value Date/Time   CHOL 166 07/04/2018 02:03 PM   TRIG 123.0 07/04/2018 02:03 PM   HDL 45.80 07/04/2018 02:03 PM   CHOLHDL 4 07/04/2018 02:03 PM   LDLCALC 95 07/04/2018 02:03 PM   LDLDIRECT 99.0 10/12/2011 12:19 PM    Wt Readings from Last 3 Encounters:  02/16/19 165 lb (74.8 kg)  01/20/19 162 lb (73.5 kg)  01/09/19 165 lb 6.4 oz  (75 kg)     Objective:    Vital Signs:  BP 103/72   Pulse 78   Ht 5' 11"  (1.803 m)   Wt 165 lb (74.8 kg)   BMI 23.01 kg/m    Well nourished, well developed female in no acute distress. Cardiopulmonary: No increased work of breathing, speaks in full sentences without dyspnea. Neuropsych: Alert and oriented x3, normal mood and affect, moves all extremities.  ASSESSMENT & PLAN:    1. Coronary artery disease involving native coronary artery of native heart without angina pectoris   2. Type 2 diabetes mellitus with diabetic neuropathy, unspecified whether long term insulin use ([place])   3. Dyslipidemia associated with type 2 diabetes mellitus (HCC)   4. Chest pain, unspecified type   5. H/O gastric bypass   6. Gastroesophageal reflux disease, esophagitis presence not specified    Atypical chest pain- she is going to try taking sublingual nitroglycerin while seated when she gets these paroxysms of fleeting sharp chest pain.  She will report to [place] in 7 to 10 days how this is helped.  She also has described a couple of episodes of severe indigestion, for which she has to take "handfuls of Tums", and these episodes have been concerning to her.  She will try nitroglycerin once or twice during these episodes as well to see if we are dealing with an atypical presentation of angina.  I have counseled her that this could also represent esophageal spasm and nitroglycerin may impact that positively as well, and we may have difficulty determining a diagnosis.  If she tolerates sublingual nitroglycerin well, we should consider low-dose Imdur.  She struggles with orthostasis and hypotension, and up titration of antianginal therapy may be very challenging.  She is excited about increasing her activity level, and I think she would be a good candidate for cardiac rehab.  I will make the referral and I am hopeful that she will be able to be enrolled.  CAD and hyperlipidemia- her LDL is not at goal, she  would like  to check her lipids first to see how close she is to LDL less than 70 after starting atorvastatin 40 mg daily.  This is reasonable and can be done on a nonemergent basis during the COVID-19 pandemic.  We will plan for this in 8 weeks to be coordinated by her primary care physician's office.  COVID-19 Education: The signs and symptoms of COVID-19 were discussed with the patient and how to seek care for testing (follow up with PCP or arrange E-visit).  The importance of social distancing was discussed today.  Time:   Today, I have spent 30 minutes with the patient with telehealth technology discussing the above problems.     Medication Adjustments/Labs and Tests Ordered: Current medicines are reviewed at length with the patient today.  Concerns regarding medicines are outlined above.   Tests Ordered: Orders Placed This Encounter  Procedures  . AMB referral to cardiac rehabilitation   Medication Changes: No orders of the defined types were placed in this encounter.   Disposition:  Follow up in 3 month(s)  Signed, Elouise Munroe, MD  02/16/2019 10:59 AM    [facility] Medication Instructions:  Continue current medications  If you need a refill on your cardiac medications before your next appointment, please call your pharmacy.  Labwork: None Ordered   Testing/Procedures: None Ordered   Follow-Up: You will need a follow up appointment in 3 months.  Please call our office 2 months in advance to schedule this appointment.  You may see Dr Margaretann Loveless or one of the following Advanced Practice Providers on your designated Care Team:   Rosaria Ferries, PA-C . Jory Sims, DNP, ANP     At [facility], you and your health needs are our priority.  As part of our continuing mission to provide you with exceptional heart care, we have created designated Provider Care Teams.  These Care Teams include your primary Cardiologist (physician) and Advanced Practice  Providers (APPs -  Physician Assistants and Nurse Practitioners) who all work together to provide you with the care you need, when you need it.  Thank you for choosing [facility] at [facility].

## 2019-02-19 ENCOUNTER — Telehealth (HOSPITAL_COMMUNITY): Payer: Self-pay | Admitting: *Deleted

## 2019-02-19 NOTE — Telephone Encounter (Signed)
Called and left message for pt in regards to the office referral received from Dr. Margaretann Loveless with the diagnosis of stable angina. Pt with recent cath in March which showed 3 vessel disease medically treat.  Clinical review of pt tele  Health  follow up appt on  cardiologist office note.  Pt appropriate for scheduling for cardiac rehab.  Advised pt that presently we our department is closed to patients due to the national pandemic Covid -19. Will forward to support staff for scheduling when permitted and insurance verification with pt  consent. Cherre Huger, BSN Cardiac and Training and development officer

## 2019-02-23 ENCOUNTER — Other Ambulatory Visit: Payer: Self-pay | Admitting: Endocrinology

## 2019-02-23 ENCOUNTER — Telehealth: Payer: Self-pay | Admitting: Family Medicine

## 2019-02-23 NOTE — Telephone Encounter (Signed)
See request °

## 2019-02-23 NOTE — Telephone Encounter (Signed)
Copied from [place] [id number]. Topic: Quick Communication - Rx Refill/Question >> Feb 23, 2019  4:30 PM Reyne Dumas L wrote: Medication: topiramate (TOPAMAX) 50 MG tablet  Has the patient contacted their pharmacy? Yes - this will be first time PCP refills  Pt called and left message on [place] asking for refill of this medication.

## 2019-02-24 ENCOUNTER — Other Ambulatory Visit: Payer: Self-pay

## 2019-02-24 ENCOUNTER — Other Ambulatory Visit: Payer: Self-pay | Admitting: Endocrinology

## 2019-02-24 MED ORDER — TOPIRAMATE 50 MG PO TABS: 100.0000 mg | ORAL_TABLET | Freq: Two times a day (BID) | ORAL | 3 refills | Status: DC

## 2019-02-24 NOTE — Telephone Encounter (Signed)
Copied from [place] [id number]. Topic: Appointment Scheduling - Scheduling Inquiry for Clinic >> Feb 23, 2019  4:31 PM Reyne Dumas L wrote: Reason for CRM:   Pt called and left message on [place].  Pt states that she needs OV to have lab work done.

## 2019-03-03 ENCOUNTER — Other Ambulatory Visit: Payer: Self-pay

## 2019-03-03 MED ORDER — PANTOPRAZOLE SODIUM 40 MG PO TBEC: 40.0000 mg | DELAYED_RELEASE_TABLET | Freq: Every day | ORAL | 3 refills | Status: DC

## 2019-03-10 ENCOUNTER — Encounter: Payer: Self-pay | Admitting: Endocrinology

## 2019-03-11 ENCOUNTER — Encounter: Payer: Self-pay | Admitting: Family Medicine

## 2019-03-11 NOTE — Telephone Encounter (Signed)
FYI. I told this pt to call the office to schedule a virtual visit, I hope this is what you prefer.

## 2019-03-11 NOTE — Telephone Encounter (Signed)
Please advise 

## 2019-03-27 ENCOUNTER — Telehealth (HOSPITAL_COMMUNITY): Payer: Self-pay | Admitting: *Deleted

## 2019-03-27 NOTE — Telephone Encounter (Signed)
Contacted patient in follow-up to referral received for Phase 2 Cardiac Rehab. Advised pt that the department is closed to patient's at this time due to COVID-19. Pt states she's walking 60 minutes daily. Patient was busy and requested a call back at a later time. Will follow-up.  Sol Passer, [place], ACSM CEP  [phone]

## 2019-03-30 ENCOUNTER — Telehealth (HOSPITAL_COMMUNITY): Payer: Self-pay | Admitting: *Deleted

## 2019-03-30 NOTE — Telephone Encounter (Signed)
Contacted patient regarding Cardiac Rehab referral and advised patient that department is temporarily closed due to the COVID-19 pandemic. Pt is walking 45-60 minutes and has experienced some mild SOB and chest pain when going up incline during her walk. Pt states symptoms resolve within 5 seconds of rest. Pt advised to contact Dr. Delphina Cahill office regarding symptoms that resolve with rest and calling 911 with unresolved symptoms, pt verbalizes understanding.  Pt has a history of right TKR, and left knee surgery and would like to strengthen leg muscles. Will mail packet with warm-up/cool-down stretches including resistance training exercises and an exercise band. Will continue to follow.  Savannah [name], Savannah [name], ACSM CEP [phone]

## 2019-04-11 ENCOUNTER — Other Ambulatory Visit: Payer: Self-pay | Admitting: Family Medicine

## 2019-04-13 ENCOUNTER — Telehealth: Payer: Self-pay | Admitting: Internal Medicine

## 2019-04-13 NOTE — Telephone Encounter (Signed)
New message:    Patient calling this weekend she was having chest pain and a bad headache. Patient would like to see the doctor.

## 2019-04-15 ENCOUNTER — Telehealth: Payer: Self-pay | Admitting: Internal Medicine

## 2019-04-15 NOTE — Telephone Encounter (Signed)
Encounter not needed

## 2019-04-15 NOTE — Telephone Encounter (Signed)
Spoke with pt, Friday pm while taking a bath she developed chest pain that traveled down into her abdomen and up into her chest. After about 5 hours of pain she took a NTG and the pain immediately went away. She then developed a bad headache, chills, fever, nausea and diarrhea. She admits she had eaten a lot of sugar cookies and had this same type of pain earlier in the week after eating cookies. Since then she has pretty much stayed in bed. She has had occ chest pain since then that is sharpe and does not last long. She can not tell if she has SOB or lightheadedness. Her bp has been running in the 90's since taking the NTG. Follow up scheduled with dr Margaretann Loveless Tuesday nest week, she will call back if symptoms change or worsen.

## 2019-04-15 NOTE — Telephone Encounter (Signed)
New message:   Patient is calling she is having some chest pains. Patient would like for a nurse to call her.

## 2019-04-21 ENCOUNTER — Other Ambulatory Visit: Payer: Self-pay

## 2019-04-21 ENCOUNTER — Telehealth: Payer: Self-pay | Admitting: *Deleted

## 2019-04-21 ENCOUNTER — Encounter: Payer: Self-pay | Admitting: Internal Medicine

## 2019-04-21 ENCOUNTER — Ambulatory Visit (INDEPENDENT_AMBULATORY_CARE_PROVIDER_SITE_OTHER): Payer: Medicare Other | Admitting: Internal Medicine

## 2019-04-21 ENCOUNTER — Other Ambulatory Visit: Payer: Medicare Other

## 2019-04-21 VITALS — BP 93/58 | HR 75 | Ht 71.0 in | Wt 153.2 lb

## 2019-04-21 DIAGNOSIS — R079 Chest pain, unspecified: Secondary | ICD-10-CM

## 2019-04-21 DIAGNOSIS — Z20822 Contact with and (suspected) exposure to covid-19: Secondary | ICD-10-CM

## 2019-04-21 DIAGNOSIS — E1140 Type 2 diabetes mellitus with diabetic neuropathy, unspecified: Secondary | ICD-10-CM | POA: Diagnosis not present

## 2019-04-21 DIAGNOSIS — E785 Hyperlipidemia, unspecified: Secondary | ICD-10-CM | POA: Diagnosis not present

## 2019-04-21 DIAGNOSIS — Z9884 Bariatric surgery status: Secondary | ICD-10-CM | POA: Diagnosis not present

## 2019-04-21 DIAGNOSIS — I25119 Atherosclerotic heart disease of native coronary artery with unspecified angina pectoris: Secondary | ICD-10-CM | POA: Diagnosis not present

## 2019-04-21 DIAGNOSIS — K219 Gastro-esophageal reflux disease without esophagitis: Secondary | ICD-10-CM | POA: Diagnosis not present

## 2019-04-21 DIAGNOSIS — R6889 Other general symptoms and signs: Secondary | ICD-10-CM | POA: Diagnosis not present

## 2019-04-21 DIAGNOSIS — E1169 Type 2 diabetes mellitus with other specified complication: Secondary | ICD-10-CM

## 2019-04-21 NOTE — Progress Notes (Signed)
Cardiology Office Note:    Date:  04/21/2019   ID:  Savannah [name], DOB [birth year], MRN [id number]  PCP:  Vivi Barrack, MD  Cardiologist:  No primary care provider on file.  Electrophysiologist:  None   Referring MD: Vivi Barrack, MD   Chief Complaint: chest pain.   History of Present Illness:    Savannah [name] is a 63 y.o. female well known to my clinic. Please see previous office notes and telehealth encounters for details. Hx of chest pain on exertion as well as atypical chest pain. Her past medical history is significant for PVCs, esophageal rings with multiple previous dilations, bariatric surgery in 2011 with appropriate postoperative weight loss.  For chest pain we referred for cath, performed by my colleague Dr. Tamala Julian on 01/20/2019.   Diffuse LAD and right coronary calcification.  Widely patent left main  Eccentric 70% proximal LAD within a calcified segment.  Distal to apical LAD is calcified and contains greater than 90% diffuse obstruction.  Circumflex coronary artery is dominant.  The second obtuse marginal which is moderate in size contains 85% stenosis followed by 80% stenosis within a tortuous mid segment.  The circumflex is otherwise widely patent.  Right coronary is nondominant and contains proximal to mid heavy calcification with 70 to 80% stenosis.  Left ventricular function is normal.  LVEDP is normal.  RECOMMENDATIONS:   The patient has significant three-vessel coronary disease with most severe lesions involving relatively small vascular territories.  The right coronary is essentially nondominant and contains an 80% proximal stenosis that could be treated with orbital atherectomy (downstream territory is small).  The second obtuse marginal contains mid vessel significant stenosis with and tortuosity with only a small territory of potential ischemia beyond the lesions and is best treated with medical therapy.  The distal LAD is heavily calcified and diffusely  involved, also best treated with medical therapy.  The proximal LAD is eccentric and is borderline significant angiographically and could be treated with orbital atherectomy and stenting if symptoms become uncontrollable.  Aggressive secondary risk prevention including LDL of 55 or less, consider adding PCSK9 therapy.  Aggressive anti-ischemic therapy  After her cath we had discussed medical management of coronary artery disease.  She did not tolerate metoprolol, and consistently has low blood pressures making medication titration for treatment of coronary disease extremely challenging.    She called in with chest pain on Friday, relieved by nitro on one occasion.  She describes this episode as sitting in the bath and beginning to feel chest pain after eating significant amounts of high-fat cookies.  Please recall the patient has a history of gastric bypass surgery and diabetes.  She was in the bath for several hours and when she continued to have chest pain. She got out of the bath and took 1 nitroglycerin which completely relieved her chest pain but caused a severe headache.  I have counseled her that she can use caffeine to counter this headache, however she tells me she cannot use caffeine at all due to PVCs that sometimes go into bigeminy.  She is not sure if her symptoms were more right upper quadrant pain, migrating to epigastric pain, or true chest pain.  She has had difficulty controling diet, but doesn't think it's dumping syndrome. She is concerned about gall bladder and had pale-colored stools after eating the rich cookies.   She has no symptoms of chest pain with walking but does occasionally get fatigue and dyspnea on exertion which  is mild and improving.  Denies significant palpitations, PND, orthopnea, or significant leg swelling.  Denies dizziness, lightheadedness, presyncope, syncope.  She endorses a fever of 99.9 (she recognizes this does not truly represent fever). She feels that this  was a common occurrence for her in the past and does not feel she has contacts who may have exposed her to COVID-19.  The patient denies dyspnea at rest or with exertion, palpitations, PND, orthopnea, or leg swelling. Denies syncope or presyncope. Denies dizziness or lightheadedness.  Past Medical History:  Diagnosis Date   ABDOMINAL PAIN, CHRONIC 10/20/2009   Acute cystitis 08/17/2009   ALLERGIC RHINITIS 11/21/2007   Allergy    ASYMPTOMATIC POSTMENOPAUSAL STATUS 09/29/2008   Bariatric surgery status 07/07/2010   Bariatric surgery status 07/07/2010   Qualifier: Diagnosis of  By: Loanne Drilling MD, Sean A    [facility])    uterine   Cataract    DEPRESSION 11/21/2007   DIABETES MELLITUS, TYPE II 07/08/2007   DYSPNEA 05/20/2009   Edema 06/14/1516   Eosinophilic esophagitis 04/12/6072   FEVER UNSPECIFIED 10/20/2009   FEVER, HX OF 03/09/2010   GERD (gastroesophageal reflux disease)    Headache(784.0) 02/06/2010   Hepatomegaly 01/06/2008   HYPERLIPIDEMIA 07/08/2007   HYPERTENSION 07/08/2007   LEUKOPENIA, MILD 09/29/2008   OBESITY 01/06/2008   OSTEOARTHRITIS 01/06/2008   Other chronic nonalcoholic liver disease 05/21/6268   PERIPHERAL NEUROPATHY 11/21/2007   Peripheral neuropathy    Postsurgical hypothyroidism 09/19/2010   SINUSITIS- ACUTE-NOS 11/21/2007   TB SKIN TEST, POSITIVE 02/06/2010   THYROID NODULE 03/09/2010    Past Surgical History:  Procedure Laterality Date   ABDOMINAL HYSTERECTOMY     BASAL CELL CARCINOMA EXCISION     COLONOSCOPY     EYE SURGERY Bilateral 08/2018, 09/2018   KNEE ARTHROSCOPY Left    LEFT HEART CATH AND CORONARY ANGIOGRAPHY N/A 01/20/2019   Procedure: LEFT HEART CATH AND CORONARY ANGIOGRAPHY;  Surgeon: Belva Crome, MD;  Location: [facility];  Service: Cardiovascular;  Laterality: N/A;   LYMPH NODE DISSECTION     OOPHORECTOMY     Right total knee replacement     ROUX-EN-Y GASTRIC BYPASS  2011   SKIN TAG REMOVAL      THYROIDECTOMY     TONSILLECTOMY AND ADENOIDECTOMY     UPPER GASTROINTESTINAL ENDOSCOPY      Current Medications: Current Meds  Medication Sig   aspirin EC 81 MG tablet Take 1 tablet (81 mg total) by mouth daily. Starting 01/18/19 prior to cardiac cath   atorvastatin (LIPITOR) 40 MG tablet Take 1 tablet (40 mg total) by mouth daily. (Patient taking differently: Take 40 mg by mouth at bedtime. )   Cholecalciferol (VITAMIN D3) 125 MCG (5000 UT) CAPS Take 5,000 Units by mouth daily.   Coenzyme Q10 (COQ-10) 400 MG CAPS Take 400 mg by mouth daily.   Colchicine (MITIGARE) 0.6 MG CAPS Take 1 capsule by mouth daily.   diclofenac sodium (VOLTAREN) 1 % GEL Apply 4 g topically 4 (four) times daily. (Patient taking differently: Apply 4 g topically 4 (four) times daily as needed (foot pain). )   docusate sodium (COLACE) 100 MG capsule Take 200 mg by mouth 2 (two) times daily.   gabapentin (NEURONTIN) 300 MG capsule TAKE 2 CAPSULES BY MOUTH TWICE A DAY (Patient taking differently: Take 600 mg by mouth 2 (two) times daily. )   halobetasol (ULTRAVATE) 0.05 % cream Apply 1 application topically 2 (two) times daily as needed (lichen sclerosus).  HYDROcodone-acetaminophen (NORCO/VICODIN) 5-325 MG tablet Take 1 tablet by mouth daily as needed for moderate pain or severe pain.   hydrOXYzine (VISTARIL) 25 MG capsule Take 25 mg by mouth at bedtime.    INVOKANA 300 MG TABS tablet TAKE 1 TABLET BY MOUTH EVERY DAY BEFORE BREAKFAST   levocetirizine (XYZAL ALLERGY 24HR) 5 MG tablet Take 1 tablet by mouth daily.     Allergies:   Ace inhibitors, Caffeine, Ciprofloxacin, Mucinex [guaifenesin er], Nsaids, and Other   Social History   Socioeconomic History   Marital status: Single    Spouse name: Not on file   Number of children: 0   Years of education: Not on file   Highest education level: Not on file  Occupational History   Occupation: Optometrist  Social Needs   Financial resource strain:  Not on file   Food insecurity    Worry: Not on file    Inability: Not on file   Transportation needs    Medical: Not on file    Non-medical: Not on file  Tobacco Use   Smoking status: Former Smoker    Packs/day: 1.50    Years: 15.00    Pack years: 22.50    Types: Cigarettes   Smokeless tobacco: Never Used  Substance and Sexual Activity   Alcohol use: Yes    Alcohol/week: 0.0 standard drinks    Comment: rarely   Drug use: Never   Sexual activity: Not on file  Lifestyle   Physical activity    Days per week: Not on file    Minutes per session: Not on file   Stress: Not on file  Relationships   Social connections    Talks on phone: Not on file    Gets together: Not on file    Attends religious service: Not on file    Active member of club or organization: Not on file    Attends meetings of clubs or organizations: Not on file    Relationship status: Not on file  Other Topics Concern   Not on file  Social History Narrative   Not on file     Family History: The patient's family history includes Breast cancer in her maternal aunt and sister; Colon cancer in her maternal uncle; Congestive Heart Failure in her father; Diabetes in her father, maternal aunt, maternal grandmother, maternal uncle, mother, and paternal grandmother; Hypertension in her father; Liver cancer in her maternal aunt; Multiple sclerosis in her sister; Myelodysplastic syndrome in her father; Osteoporosis in her sister.  ROS:   Please see the history of present illness.    All other systems reviewed and are negative.  EKGs/Labs/Other Studies Reviewed:    The following studies were reviewed today:  EKG: NSR, low voltage QRS.  Recent Labs: 01/09/2019: BUN 17; Creatinine, Ser 1.29; Hemoglobin 13.8; Platelets 180; Potassium 4.0; Sodium 142; TSH 0.241  Recent Lipid Panel    Component Value Date/Time   CHOL 166 07/04/2018 1403   TRIG 123.0 07/04/2018 1403   HDL 45.80 07/04/2018 1403   CHOLHDL  4 07/04/2018 1403   VLDL 24.6 07/04/2018 1403   LDLCALC 95 07/04/2018 1403   LDLDIRECT 99.0 10/12/2011 1219    Physical Exam:    VS:  BP (!) 93/58    Pulse 75    Ht 5' 11"  (1.803 m)    Wt 153 lb 3.2 oz (69.5 kg)    BMI 21.37 kg/m     Wt Readings from Last 5 Encounters:  04/21/19 153 lb 3.2  oz (69.5 kg)  02/16/19 165 lb (74.8 kg)  01/20/19 162 lb (73.5 kg)  01/09/19 165 lb 6.4 oz (75 kg)  12/31/18 168 lb 12.8 oz (76.6 kg)     Constitutional: No acute distress Eyes: sclera non-icteric, normal conjunctiva and lids ENMT: normal dentition, moist mucous membranes Cardiovascular: regular rhythm, normal rate, s1 s2 normal. No jugular venous distention.  Respiratory: clear to auscultation bilaterally GI : +bs, soft and nontender. No distention.   MSK: extremities warm, well perfused. No edema.  NEURO: grossly nonfocal exam, moves all extremities. PSYCH: alert and oriented x 3, normal mood and affect.   ASSESSMENT:    1. Chest pain, unspecified type   2. Type 2 diabetes mellitus with diabetic neuropathy, unspecified whether long term insulin use ([place])   3. Dyslipidemia associated with type 2 diabetes mellitus ([place])   4. Coronary artery disease involving native heart with angina pectoris, unspecified vessel or lesion type ([place])   5. H/O gastric bypass   6. Gastroesophageal reflux disease, esophagitis presence not specified    PLAN:    First and foremost, I am concerned about her description of fever, chills, nausea and infectious symptoms. We will test her for COVID-19  Chest pain and known CAD - ideally we would titrate antianginal therapy and optimize GDMT, however, she cannot tolerate any antianginals due to hypotension and dizziness. We have tried metoprolol. I would consider imdur low dose next, however she had a severe headache with 1 sublingual nitro. At the current time, she can continue to take sublingual nitro as needed. Fortuantely her chest pain is atypical in nature, and  was provoked by a high fat, high carbohydrate meal. She should pursue a GI workup with her PCP or GI doctor. Certainly we should consider complex coronary intervention if needed. We participated in shared decision making and will continue to observe at this time as she is not keen to pursue intervention at this time.   To continue to optimize, we will arrange an appt with lipid clinic to discuss pcsk9 inhibitor.  DM2 - management per PCP. Her high carbohydrate meal may have caused glucose aberrations, leading to her symptoms. I have asked her to review this with her endocrinologist.   Aggressive diet control to avoid an exacerbation of symptoms and pcp follow up recommended.   TIME SPENT WITH PATIENT: 25 minutes of direct patient care. More than 50% of that time was spent on coordination of care and counseling regarding diet and lifestyle modification, management of chest pain, and medication management.  Cherlynn Kaiser, MD [place]   [facility]   Medication Adjustments/Labs and Tests Ordered: Current medicines are reviewed at length with the patient today.  Concerns regarding medicines are outlined above.  Orders Placed This Encounter  Procedures   Lipid panel   EKG 12-Lead   No orders of the defined types were placed in this encounter.   Patient Instructions  Medication Instructions:  No change   If you need a refill on your cardiac medications before your next appointment, please call your pharmacy.   Lab work: covid test -- please go to [address]    LIPID FASTING - CAN COME TO OFFICE  TO HAVE LAB DONE.  If you have labs (blood work) drawn today and your tests are completely normal, you will receive your results only by:  [place] (if you have MyChart) OR  A paper copy in the mail If you have any lab test that is abnormal  or we need to change your treatment, we will call you to review the results.  Testing/Procedures: Not  needed  Follow-Up: At [facility], you and your health needs are our priority.  As part of our continuing mission to provide you with exceptional heart care, we have created designated Provider Care Teams.  These Care Teams include your primary Cardiologist (physician) and Advanced Practice Providers (APPs -  Physician Assistants and Nurse Practitioners) who all work together to provide you with the care you need, when you need it.  You will need a follow up appointment in 3 months.  Please call our office 2 months in advance to schedule this appointment.  You may see one of the following Advanced Practice Providers on your designated Care Team:    Rosaria Ferries, PA-C  Jory Sims, DNP, ANP  Any Other Special Instructions Will Be Listed Below (If Applicable).   You have been referred to  [facility] [address] -( PHARMACIST)

## 2019-04-21 NOTE — Patient Instructions (Addendum)
Medication Instructions:  No change   If you need a refill on your cardiac medications before your next appointment, please call your pharmacy.   Lab work: covid test -- please go to [address]    LIPID FASTING - CAN COME TO OFFICE  TO HAVE LAB DONE.  If you have labs (blood work) drawn today and your tests are completely normal, you will receive your results only by: [name] MyChart Message (if you have MyChart) OR . A paper copy in the mail If you have any lab test that is abnormal or we need to change your treatment, we will call you to review the results.  Testing/Procedures: Not needed  Follow-Up: At [facility], you and your health needs are our priority.  As part of our continuing mission to provide you with exceptional heart care, we have created designated Provider Care Teams.  These Care Teams include your primary Cardiologist (physician) and Advanced Practice Providers (APPs -  Physician Assistants and Nurse Practitioners) who all work together to provide you with the care you need, when you need it. . You will need a follow up appointment in 3 months.  Please call our office 2 months in advance to schedule this appointment.  You may see one of the following Advanced Practice Providers on your designated Care Team:   . Rosaria Ferries, PA-C . Jory Sims, DNP, ANP  Any Other Special Instructions Will Be Listed Below (If Applicable).   You have been referred to  [place] [address] -( PHARMACIST)

## 2019-04-21 NOTE — Telephone Encounter (Addendum)
Contacted pt to schedule COVID testing ; pt offered and accepted appointment at [facility] site 04/21/2019 at 1545; pt given address, location, and instructions that she and all occupants of her vehicle should wear masks; she verbalized understanding; orders placed per protocol ----- Message from Raiford Simmonds, RN sent at 04/21/2019  2:16 PM EDT ----- Regarding: needs covid test Needs covid test -

## 2019-04-21 NOTE — Telephone Encounter (Signed)
Spoke with patient in regards - information-  Referral to cvrr - lipid  mailed labslip as requested . Patient voiced concerned about how medication will be paid for. rn informed patient to wait until appointment to discuss her concerned . She verbalized understanding.

## 2019-04-22 ENCOUNTER — Ambulatory Visit (INDEPENDENT_AMBULATORY_CARE_PROVIDER_SITE_OTHER): Payer: Medicare Other | Admitting: Family Medicine

## 2019-04-22 DIAGNOSIS — R1013 Epigastric pain: Secondary | ICD-10-CM

## 2019-04-22 DIAGNOSIS — K219 Gastro-esophageal reflux disease without esophagitis: Secondary | ICD-10-CM

## 2019-04-22 LAB — NOVEL CORONAVIRUS, NAA: SARS-CoV-2, NAA: NOT DETECTED

## 2019-04-22 NOTE — Progress Notes (Signed)
    Chief Complaint:  Savannah [name] is a 63 y.o. female who presents today for a virtual office visit with a chief complaint of abdominal [name].   Assessment/Plan:  Abdominal [name] Differential includes biliary pathology versus esophageal spasm.  Suspect that her symptoms were likely secondary to esophageal spasm given her prior history of eosinophilic esophagitis and the fact that her symptoms resolved almost immediately with nitroglycerin.  Will check right upper quadrant ultrasound to further evaluate for gallstones or other abnormalities.  If negative will need referral back to GI for evaluation.  She will continue taking her Protonix 40 mg daily in the meantime.    Subjective:  HPI:  Abdominal [name] Had an episode about a week ago when she started having chest [name] after eating cookies.  [name] was mild at first and located in her central chest.  [name] then progressed into radiating into her epigastric area.   Symptoms persisted and worsened over a few hours. She eventually took nitroglycerin which completely resolved her symptoms. After this, she started having intermittent fevers and chills, nausea, and loose stools.  Symptoms of been relatively stable over the past week however she has noticed increased frequency of clay colored stools.  She saw her cardiologist yesterday who was concerned about possible coinfection and sent her to get testing.  Those results are still pending.  She has a history of gastric bypass and eosinophilic esophagitis.  She last had an EGD 2 years ago which was reportedly normal.  She has been taking Protonix 40 mg daily and tolerating well.  She has had some weight loss over the past few years.  Also decreased appetite.  No other obvious alleviating or aggravating factors.   ROS: Per HPI  PMH: She reports that she has quit smoking. Her smoking use included cigarettes. She has a 22.50 pack-year smoking history. She has never used smokeless tobacco. She reports current  alcohol use. She reports that she does not use drugs.      Objective/Observations  Physical Exam: Gen: NAD, resting comfortably Pulm: Normal work of breathing Neuro: Grossly normal, moves all extremities Psych: Normal affect and thought content  No results found for this or any previous visit (from the past 24 hour(s)).   Virtual Visit via Video   I connected with Savannah [name] on 04/22/19 at 11:00 AM EDT by a video enabled telemedicine application and verified that I am speaking with the correct person using two identifiers. I discussed the limitations of evaluation and management by telemedicine and the availability of in person appointments. The patient expressed understanding and agreed to proceed.   Patient location: Home Provider location: [place] participating in the virtual visit: Myself and Patient     Savannah [name]. Savannah Pain, MD 04/22/2019 11:04 AM

## 2019-04-23 ENCOUNTER — Other Ambulatory Visit: Payer: Self-pay | Admitting: Endocrinology

## 2019-04-23 ENCOUNTER — Other Ambulatory Visit: Payer: Self-pay | Admitting: Family Medicine

## 2019-04-27 ENCOUNTER — Encounter: Payer: Self-pay | Admitting: Family Medicine

## 2019-04-27 NOTE — Telephone Encounter (Signed)
Rx Request 

## 2019-04-28 ENCOUNTER — Telehealth (HOSPITAL_COMMUNITY): Payer: Self-pay

## 2019-04-28 ENCOUNTER — Encounter (HOSPITAL_COMMUNITY)
Admission: RE | Admit: 2019-04-28 | Discharge: 2019-04-28 | Disposition: A | Discharge status: Home / Self Care | Payer: Self-pay | Source: Ambulatory Visit | Attending: Internal Medicine | Admitting: Internal Medicine

## 2019-04-28 NOTE — Telephone Encounter (Signed)
Routed to Dr. Jerline Pain for review.

## 2019-04-28 NOTE — Telephone Encounter (Signed)
Please advise 

## 2019-04-28 NOTE — Telephone Encounter (Signed)

## 2019-04-28 NOTE — Telephone Encounter (Signed)
Called and spoke to pt regarding Virtual Cardiac Rehab.  Pt  was able to download the [facility] app on their smart device with no issues. Pt set up their account and received the following welcome message -"Welcome to the [facility] and Pulmonary Rehabilitation program. We hope that you will find the exercise program beneficial in your recovery process. Our staff is available to assist with any questions/concerns about your exercise routine. Best wishes". Brief orientation provided to with the advisement to watch the "Intro to Rehab" series located under the Resource tab. Pt verbalized understanding. Will continue to follow and monitor pt progress with feedback as needed.  Pt also expressed interest in speaking with cardiac rehab dietician. Set up appointment for 6.17.20 to speak with dietician.   Carma Lair MS, ACSM CEP 3:48 PM 04/28/2019

## 2019-04-29 ENCOUNTER — Telehealth: Payer: Self-pay | Admitting: Rheumatology

## 2019-04-29 DIAGNOSIS — E785 Hyperlipidemia, unspecified: Secondary | ICD-10-CM | POA: Diagnosis not present

## 2019-04-29 DIAGNOSIS — E1140 Type 2 diabetes mellitus with diabetic neuropathy, unspecified: Secondary | ICD-10-CM | POA: Diagnosis not present

## 2019-04-29 DIAGNOSIS — E1169 Type 2 diabetes mellitus with other specified complication: Secondary | ICD-10-CM | POA: Diagnosis not present

## 2019-04-29 DIAGNOSIS — M1990 Unspecified osteoarthritis, unspecified site: Secondary | ICD-10-CM

## 2019-04-29 NOTE — Telephone Encounter (Signed)
Lab order released for [facility]. I attempted to contact [facility] 7 times and could not reach an operator. I advised patient that the order has been released but I cannot get in touch with anyone at [facility]. Patient will try to contact someone there.

## 2019-04-29 NOTE — Telephone Encounter (Signed)
Patient called and states she called the [place] location (main lab) and was told to have [place] call that number. I called the [place] location and was advised to call back tomorrow to add the test on.

## 2019-04-29 NOTE — Telephone Encounter (Signed)
Patient called stating at her virtual appointment on 02/12/19 Dr. Estanislado Pandy told her she would send in labwork order for Basic Metabolic Panel and to do that bloodwork when she gets her Lipid panel.  Patient states she went to [facility] on [address] today and had her Lipid panel, but they didn't have Dr. Arlean Hopping order.  Patient states [facility] told her if Dr. Estanislado Pandy calls the [facility] labwork into the main office they can add it to her bloodwork from today.

## 2019-04-30 ENCOUNTER — Telehealth (HOSPITAL_COMMUNITY): Payer: Self-pay | Admitting: *Deleted

## 2019-04-30 ENCOUNTER — Encounter (HOSPITAL_COMMUNITY): Payer: PRIVATE HEALTH INSURANCE

## 2019-04-30 ENCOUNTER — Other Ambulatory Visit: Payer: Self-pay

## 2019-04-30 ENCOUNTER — Encounter (HOSPITAL_COMMUNITY)
Admission: RE | Admit: 2019-04-30 | Discharge: 2019-04-30 | Disposition: A | Discharge status: Home / Self Care | Payer: Self-pay | Source: Ambulatory Visit | Attending: Internal Medicine | Admitting: Internal Medicine

## 2019-04-30 ENCOUNTER — Encounter: Payer: Self-pay | Admitting: Endocrinology

## 2019-04-30 ENCOUNTER — Encounter: Payer: Self-pay | Admitting: Rheumatology

## 2019-04-30 LAB — LIPID PANEL
Chol/HDL Ratio: 2.9 ratio (ref 0.0–4.4)
Cholesterol, Total: 136 mg/dL (ref 100–199)
HDL: 47 mg/dL (ref >39)
LDL Calculated: 69 mg/dL (ref 0–99)
Triglycerides: 102 mg/dL (ref 0–149)
VLDL Cholesterol Cal: 20 mg/dL (ref 5–40)

## 2019-04-30 NOTE — Telephone Encounter (Signed)
Called and spoke to [facility] regarding her symptoms. pt complained of feeling dizzy and lightheaded on Tuesday. bp 95/60;blood sugar 85 which paitient states this is normal for her and she does not feel bad unless it drops in the 60's Pt is able to distinguish between low bp verses low blood sugar. Pt with history of dizziness associated with her diabetic medication - Ivankano. Pt was advised to hold this medication for 30 days. During the 30 days pt did not have any symptoms. Pt was instructed to add the Ivankano back since then she averages 2-3 days a week "bad" days. Pt will send Dr. Loanne Drilling a message through [facility] to let him know of her symptoms and ask if she could take a lower dosage.  Pt complained of mild jaw pain while she felt stressed.  Pt has residual disease that is not amenable to intervention and is treated medically. Pt said the jaw pain was fleeting and went away after she took a few deep breaths. Pt aware should her jaw pain change in nature, severity and not normal times to please contact her cardiologist.  Pt aware of NTG and is able to demonstrate appropriately how to take. Pt encouraged to keep rehab staff apprised of any lingering symptoms.. Will continue to follow.Maurice Small RN, BSN Cardiac and Pulmonary Rehab Nurse Navigator

## 2019-04-30 NOTE — Progress Notes (Signed)
Called pt to discuss heart healthy eating. Pt shared she is a texture person. This dictates majority of the food she consumes. Discussed how to make heart healthy eating fall within these textural preferences today. Pt has had gastric bypass, reviewed importance of taking bariatric vitamins, how to build a healthy plate, and discussed the importance of ensuring she consumes adequate protein and heart healthy fats. Recommended pt make the following heart healthy changes a permanent part of dietary pattern:  1.Eat a balanced diet with whole grains, fruits, vegetables, lean protein sources.  2.Choose heart healthy unsaturated fats. Limit saturated fats, trans fats. Eat more plant based meals using beans and soy foods for protein. 3. Eat whole unprocessed foods to limit the amount of sodium (salt) consumed. 4. Choose a consistent amunt of carbohydrates at each meal and snack.  5. Limit refined carbohydrates especially sugar, sweets, and sugar-sweetened beverages. 6. Limit alcohol consumption.  Patient was engaged in discussion today, and open to trying dietitian recommendations. Praised pt for his open mindedness and willingness to try new suggestions, encouraged pt to keep up the good work.   Spent 30 minutes discussing heart heatlhy eating with patient today   Laurina Bustle, MS, RD, LDN 04/30/2019 3:40 PM Doesn't like:

## 2019-04-30 NOTE — Telephone Encounter (Signed)
FYI

## 2019-05-01 ENCOUNTER — Telehealth: Payer: Self-pay | Admitting: Pharmacist

## 2019-05-01 ENCOUNTER — Encounter: Payer: Self-pay | Admitting: Endocrinology

## 2019-05-01 NOTE — Telephone Encounter (Signed)
Spoke with Savannah [name] at [place] has been added and results will be faxed once completed.

## 2019-05-01 NOTE — Telephone Encounter (Signed)
Pt scheduled for in office appt on 05/14/19. Advised to arrive at 3:15pm. Verbalized acceptance and understanding.

## 2019-05-01 NOTE — Telephone Encounter (Signed)
This visit type was conducted due to national recommendations for restrictions regarding the COVID-19 Pandemic (e.g. social distancing) in an effort to limit this patient's exposure and mitigate transmission in our community.  Patient was referred to CV[facility] clinic to discuss lipid therapy.  Total time on phone with patient: 30 minutes  Ashling E Santmyer 63 y.o. with PMH significant for bariatric surgery, depression, DM-II, hepatomegaly, dyslipidemia, CAD with angina pectoris, neuropathy, and GERD.   Current medications for hyperlipidemia: atorvastatin 40mg  daily  Medications tried/failed: simvastatin 40mg  daily  Diet: low fat and low carb diet  Exercise: walking every day for the last 6 months  Labs:   Date Total Cholesterol Triglycerides HDL LDL  04/29/2019 136 102 47 69  07/04/2018 166 123 45 95     Assessment/Plan: LDL at goal after 6 months of increased physical activity, low fat diet, and atorvastatin 40mg  daily. Patient tolerating therapy well and denies adverse effects.   Patient was interested on decreasing the amount if daily medication, but refused all recommendations given to discontinue or decrease use of OTC supplements.   Anavictoria Wilk Rodriguez-Guzman PharmD, BCPS, [place] [id number] 05/01/2019 12:25 PM

## 2019-05-06 LAB — BASIC METABOLIC PANEL
BUN/Creatinine Ratio: 13 (ref 12–28)
BUN: 16 mg/dL (ref 8–27)
CO2: 20 mmol/L (ref 20–29)
Calcium: 9.5 mg/dL (ref 8.7–10.3)
Chloride: 106 mmol/L (ref 96–106)
Creatinine, Ser: 1.24 mg/dL — ABNORMAL HIGH (ref 0.57–1.00)
GFR calc Af Amer: 53 mL/min/{1.73_m2} — ABNORMAL LOW (ref >59)
GFR calc non Af Amer: 46 mL/min/{1.73_m2} — ABNORMAL LOW (ref >59)
Glucose: 60 mg/dL — ABNORMAL LOW (ref 65–99)
Potassium: 4.5 mmol/L (ref 3.5–5.2)
Sodium: 143 mmol/L (ref 134–144)

## 2019-05-06 LAB — SPECIMEN STATUS REPORT

## 2019-05-12 ENCOUNTER — Encounter: Payer: Self-pay | Admitting: Rheumatology

## 2019-05-12 ENCOUNTER — Other Ambulatory Visit: Payer: Self-pay

## 2019-05-14 ENCOUNTER — Other Ambulatory Visit: Payer: Self-pay

## 2019-05-14 ENCOUNTER — Encounter: Payer: Self-pay | Admitting: Family Medicine

## 2019-05-14 ENCOUNTER — Ambulatory Visit: Payer: PRIVATE HEALTH INSURANCE | Admitting: Endocrinology

## 2019-05-14 ENCOUNTER — Encounter: Payer: Self-pay | Admitting: Endocrinology

## 2019-05-14 ENCOUNTER — Ambulatory Visit (INDEPENDENT_AMBULATORY_CARE_PROVIDER_SITE_OTHER): Payer: Medicare Other | Admitting: Endocrinology

## 2019-05-14 VITALS — BP 100/58 | HR 94 | Ht 71.0 in | Wt 156.0 lb

## 2019-05-14 DIAGNOSIS — E1140 Type 2 diabetes mellitus with diabetic neuropathy, unspecified: Secondary | ICD-10-CM | POA: Diagnosis not present

## 2019-05-14 DIAGNOSIS — E1159 Type 2 diabetes mellitus with other circulatory complications: Secondary | ICD-10-CM | POA: Diagnosis not present

## 2019-05-14 DIAGNOSIS — I959 Hypotension, unspecified: Secondary | ICD-10-CM | POA: Diagnosis not present

## 2019-05-14 LAB — POCT GLYCOSYLATED HEMOGLOBIN (HGB A1C): Hemoglobin A1C: 6 % — AB (ref 4.0–5.6)

## 2019-05-14 MED ORDER — METFORMIN HCL ER 500 MG PO TB24: 1000.0000 mg | ORAL_TABLET | Freq: Every day | ORAL | 3 refills | Status: DC

## 2019-05-14 MED ORDER — CANAGLIFLOZIN 100 MG PO TABS: 100.0000 mg | ORAL_TABLET | Freq: Every day | ORAL | 3 refills | Status: DC

## 2019-05-14 NOTE — Patient Instructions (Addendum)
I have sent prescriptions to your pharmacy, to reduce the metformin and invokana.   check your blood sugar once a day.  vary the time of day when you check, between before the 3 meals, and at bedtime.  also check if you have symptoms of your blood sugar being too high or too low.  please keep a record of the readings and bring it to your next appointment here (or you can bring the meter itself).  You can write it on any piece of paper.  please call us sooner if your blood sugar goes below 70, or if you have a lot of readings over 200. Please come back for a follow-up appointment in 2 months.   Please call sooner if the low blood pressure persists.  The next stop would be to stop the invokana.  The next step after that would be to to a "before and after" cortisone blood test here.

## 2019-05-14 NOTE — Progress Notes (Signed)
Subjective:    Patient ID: Savannah [name], female    DOB: March 01, 1956, 63 y.o.   MRN: [id number]  HPI Pt returns for f/u of diabetes mellitus:  DM type: 2 Dx'ed: 2993 Complications: painful polyneuropathy, and CAD (seen on CT).   Therapy: trulicity and 3 oral meds.   GDM: never DKA: never Severe hypoglycemia: never.   Pancreatitis: never.  Other: she took insulin from 2004-2011, when she had gastric bypass surgery.   Interval history: she takes meds as rx'ed, except for the invokana.  She says cbg's vary from 58-200's.  It is in general higher as the day goes on.  Pt says she takes the repaglinide BID, if that.   Pt says BP at home is low 65-100/ Past Medical History:  Diagnosis Date  . ABDOMINAL PAIN, CHRONIC 10/20/2009  . Acute cystitis 08/17/2009  . ALLERGIC RHINITIS 11/21/2007  . Allergy   . ASYMPTOMATIC POSTMENOPAUSAL STATUS 09/29/2008  . Bariatric surgery status 07/07/2010  . Bariatric surgery status 07/07/2010   Qualifier: Diagnosis of  By: Loanne Drilling MD, Jacelyn Pi   . Cancer ([place])    uterine  . Cataract   . DEPRESSION 11/21/2007  . DIABETES MELLITUS, TYPE II 07/08/2007  . DYSPNEA 05/20/2009  . Edema 05/20/2009  . Eosinophilic esophagitis 05/12/6966  . FEVER UNSPECIFIED 10/20/2009  . FEVER, HX OF 03/09/2010  . GERD (gastroesophageal reflux disease)   . Headache(784.0) 02/06/2010  . Hepatomegaly 01/06/2008  . HYPERLIPIDEMIA 07/08/2007  . HYPERTENSION 07/08/2007  . LEUKOPENIA, MILD 09/29/2008  . OBESITY 01/06/2008  . OSTEOARTHRITIS 01/06/2008  . Other chronic nonalcoholic liver disease 8/93/8101  . PERIPHERAL NEUROPATHY 11/21/2007  . Peripheral neuropathy   . Postsurgical hypothyroidism 09/19/2010  . SINUSITIS- ACUTE-NOS 11/21/2007  . TB SKIN TEST, POSITIVE 02/06/2010  . THYROID NODULE 03/09/2010    Past Surgical History:  Procedure Laterality Date  . ABDOMINAL HYSTERECTOMY    . BASAL CELL CARCINOMA EXCISION    . COLONOSCOPY    . EYE SURGERY Bilateral 08/2018, 09/2018  . KNEE ARTHROSCOPY  Left   . LEFT HEART CATH AND CORONARY ANGIOGRAPHY N/A 01/20/2019   Procedure: LEFT HEART CATH AND CORONARY ANGIOGRAPHY;  Surgeon: Belva Crome, MD;  Location: [facility];  Service: Cardiovascular;  Laterality: N/A;  . LYMPH NODE DISSECTION    . OOPHORECTOMY    . Right total knee replacement    . ROUX-EN-Y GASTRIC BYPASS  2011  . SKIN TAG REMOVAL    . THYROIDECTOMY    . TONSILLECTOMY AND ADENOIDECTOMY    . UPPER GASTROINTESTINAL ENDOSCOPY      Social History   Socioeconomic History  . Marital status: Single    Spouse name: Not on file  . Number of children: 0  . Years of education: Not on file  . Highest education level: Not on file  Occupational History  . Occupation: Optometrist  Social Needs  . Financial resource strain: Not on file  . Food insecurity    Worry: Not on file    Inability: Not on file  . Transportation needs    Medical: Not on file    Non-medical: Not on file  Tobacco Use  . Smoking status: Former Smoker    Packs/day: 1.50    Years: 15.00    Pack years: 22.50    Types: Cigarettes  . Smokeless tobacco: Never Used  Substance and Sexual Activity  . Alcohol use: Yes    Alcohol/week: 0.0 standard drinks    Comment: rarely  .  Drug use: Never  . Sexual activity: Not on file  Lifestyle  . Physical activity    Days per week: Not on file    Minutes per session: Not on file  . Stress: Not on file  Relationships  . Social Herbalist on phone: Not on file    Gets together: Not on file    Attends religious service: Not on file    Active member of club or organization: Not on file    Attends meetings of clubs or organizations: Not on file    Relationship status: Not on file  . Intimate partner violence    Fear of current or ex partner: Not on file    Emotionally abused: Not on file    Physically abused: Not on file    Forced sexual activity: Not on file  Other Topics Concern  . Not on file  Social History Narrative  . Not on file     Current Outpatient Medications on File Prior to Visit  Medication Sig Dispense Refill  . aspirin EC 81 MG tablet Take 1 tablet (81 mg total) by mouth daily. Starting 01/18/19 prior to cardiac cath    . Cholecalciferol (VITAMIN D3) 125 MCG (5000 UT) CAPS Take 5,000 Units by mouth daily.    . Coenzyme Q10 (COQ-10) 400 MG CAPS Take 400 mg by mouth daily.    . Colchicine (MITIGARE) 0.6 MG CAPS Take 1 capsule by mouth daily. 90 capsule 1  . diclofenac sodium (VOLTAREN) 1 % GEL Apply 4 g topically 4 (four) times daily. (Patient taking differently: Apply 4 g topically 4 (four) times daily as needed (foot pain). ) 100 g 11  . docusate sodium (COLACE) 100 MG capsule Take 200 mg by mouth 2 (two) times daily.    [name] gabapentin (NEURONTIN) 300 MG capsule Take 2 capsules (600 mg total) by mouth 2 (two) times daily. 360 capsule 2  . halobetasol (ULTRAVATE) 0.05 % cream Apply 1 application topically 2 (two) times daily as needed (lichen sclerosus).   2  . HYDROcodone-acetaminophen (NORCO/VICODIN) 5-325 MG tablet Take 1 tablet by mouth daily as needed for moderate pain or severe pain.    . hydrOXYzine (VISTARIL) 25 MG capsule Take 25 mg by mouth at bedtime.     [name] levocetirizine (XYZAL ALLERGY 24HR) 5 MG tablet Take 1 tablet by mouth daily.    [name] levothyroxine (SYNTHROID, LEVOTHROID) 112 MCG tablet Take 112 mcg by mouth daily before breakfast.    . MAGNESIUM PO Take 1 tablet by mouth daily as needed (cramps).     . Multiple Vitamin (MULTIVITAMIN) tablet Take 1 tablet by mouth daily.     . nitroGLYCERIN (NITROSTAT) 0.4 MG SL tablet Place 1 tablet (0.4 mg total) under the tongue every 5 (five) minutes as needed. (Patient taking differently: Place 0.4 mg under the tongue every 5 (five) minutes as needed for chest pain. ) 25 tablet 3  . ONE TOUCH ULTRA TEST test strip USE TO CHECK BLOOD SUGAR 1 TIME PER DAY. 100 each 5  . pantoprazole (PROTONIX) 40 MG tablet Take 1 tablet (40 mg total) by mouth daily. 30 tablet 3  .  phenazopyridine (PYRIDIUM) 100 MG tablet Take 100 mg by mouth 3 (three) times daily as needed for pain.    . Probiotic CAPS Take 1 capsule by mouth daily.    . repaglinide (PRANDIN) 2 MG tablet TAKE 1 TABLET BY MOUTH 3 TIMES A DAY BEFORE MEALS 270 tablet 1  .  topiramate (TOPAMAX) 50 MG tablet TAKE 2 TABLETS BY MOUTH TWICE A DAY 60 tablet 3  . traMADol (ULTRAM) 50 MG tablet Take 50 mg by mouth 2 (two) times daily as needed for moderate pain.    . TRULICITY 1.5 CB/4.4HQ SOPN INJECT 1.5 MG INTO THE SKIN ONCE A WEEK. (Patient taking differently: Inject 1.5 mg as directed every Sunday. ) 6 pen 11  . atorvastatin (LIPITOR) 40 MG tablet Take 1 tablet (40 mg total) by mouth daily. (Patient taking differently: Take 40 mg by mouth at bedtime. ) 90 tablet 3   No current facility-administered medications on file prior to visit.     Allergies  Allergen Reactions  . Ace Inhibitors Cough  . Caffeine     PVCs  . Ciprofloxacin Itching  . Mucinex [Guaifenesin Er]     PVCs  . Nsaids     Gastric Bypass Surgery - unable to take, tolerates ec 81 aspirin   . Other     Antihistamine-alkylamine - PVCs tolerates benadryl     Family History  Problem Relation Age of Onset  . Multiple sclerosis Sister   . Breast cancer Sister        breast onset age 54  . Osteoporosis Sister   . Diabetes Mother   . Diabetes Father   . Congestive Heart Failure Father   . Hypertension Father   . Myelodysplastic syndrome Father   . Liver cancer Maternal Aunt   . Breast cancer Maternal Aunt   . Diabetes Maternal Aunt   . Colon cancer Maternal Uncle   . Diabetes Maternal Uncle   . Diabetes Maternal Grandmother   . Diabetes Paternal Grandmother     BP (!) 100/58 (BP Location: Left Arm, Patient Position: Sitting, Cuff Size: Normal)   Pulse 94   Ht 5\' 11"  (1.803 m)   Wt 156 lb (70.8 kg)   SpO2 96%   BMI 21.76 kg/m    Review of Systems Denies LOC.  She has lost 9 lbs since last ov here.      Objective:   Physical  Exam VITAL SIGNS:  See vs page GENERAL: no distress Pulses: dorsalis pedis intact bilat.   MSK: no deformity of the feet CV: 1+ bilat leg edema Skin:  no ulcer on the feet.  normal color and temp on the feet.  Neuro: sensation is intact to touch on the feet, but decreased from normal.  Lab Results  Component Value Date   HGBA1C 6.0 (A) 05/14/2019   Lab Results  Component Value Date   CREATININE 1.24 (H) 04/29/2019   BUN 16 04/29/2019   NA 143 04/29/2019   K 4.5 04/29/2019   CL 106 04/29/2019   CO2 20 04/29/2019       Assessment & Plan:  Type 2 DM, with CAD: well-controlled Renal insuff: This limits rx options Hypotension: uncertain etiology.  If med reduction does not help, we'll do ACTH test  Patient Instructions  I have sent prescriptions to your pharmacy, to reduce the metformin and invokana.   check your blood sugar once a day.  vary the time of day when you check, between before the 3 meals, and at bedtime.  also check if you have symptoms of your blood sugar being too high or too low.  please keep a record of the readings and bring it to your next appointment here (or you can bring the meter itself).  You can write it on any piece of paper.  please call us  sooner if your blood sugar goes below 70, or if you have a lot of readings over 200. Please come back for a follow-up appointment in 2 months.   Please call sooner if the low blood pressure persists.  The next stop would be to stop the invokana.  The next step after that would be to to a "before and after" cortisone blood test here.

## 2019-05-15 ENCOUNTER — Encounter: Payer: Self-pay | Admitting: Family Medicine

## 2019-05-18 ENCOUNTER — Other Ambulatory Visit: Payer: Self-pay

## 2019-05-18 DIAGNOSIS — R1013 Epigastric pain: Secondary | ICD-10-CM

## 2019-05-18 DIAGNOSIS — R7989 Other specified abnormal findings of blood chemistry: Secondary | ICD-10-CM

## 2019-05-18 DIAGNOSIS — K219 Gastro-esophageal reflux disease without esophagitis: Secondary | ICD-10-CM

## 2019-05-19 ENCOUNTER — Other Ambulatory Visit: Payer: PRIVATE HEALTH INSURANCE

## 2019-05-20 ENCOUNTER — Other Ambulatory Visit: Payer: Self-pay | Admitting: Endocrinology

## 2019-05-21 ENCOUNTER — Other Ambulatory Visit (INDEPENDENT_AMBULATORY_CARE_PROVIDER_SITE_OTHER): Payer: Medicare Other

## 2019-05-21 ENCOUNTER — Other Ambulatory Visit: Payer: Self-pay

## 2019-05-21 ENCOUNTER — Other Ambulatory Visit: Payer: Medicare Other

## 2019-05-21 DIAGNOSIS — R7989 Other specified abnormal findings of blood chemistry: Secondary | ICD-10-CM

## 2019-05-21 LAB — BASIC METABOLIC PANEL
BUN: 17 mg/dL (ref 6–23)
CO2: 26 mEq/L (ref 19–32)
Calcium: 8.5 mg/dL (ref 8.4–10.5)
Chloride: 111 mEq/L (ref 96–112)
Creatinine, Ser: 0.87 mg/dL (ref 0.40–1.20)
GFR: 65.34 mL/min (ref >60.00)
Glucose, Bld: 81 mg/dL (ref 70–99)
Potassium: 3.5 mEq/L (ref 3.5–5.1)
Sodium: 144 mEq/L (ref 135–145)

## 2019-05-21 NOTE — Progress Notes (Signed)
Dr Marigene Ehlers interpretation of your lab work:  Good news! Your kidney function is NORMAL. You elevated results previously were probably from dehydration. Please make sure that you continue to get plenty of fluids and we will recheck next time you need blood work.    If you have any additional questions, please give [place] a call or send [place] a message through [place].  Take care, Dr Jerline Pain

## 2019-05-24 ENCOUNTER — Other Ambulatory Visit: Payer: Self-pay | Admitting: Family Medicine

## 2019-05-25 ENCOUNTER — Telehealth: Payer: Self-pay | Admitting: Endocrinology

## 2019-05-25 NOTE — Telephone Encounter (Signed)
Harlene Salts with [facility] ph# [phone] called re: will Dr. Loanne Drilling allow patient to take a statin medication? If patient is already taking a statin medication-the pharmacy has not notified the above. Please call [place] at the above ph# to advise. Reference is Dr. Cordelia Pen NPI#.

## 2019-05-25 NOTE — Telephone Encounter (Signed)
V.O. received for [facility] to follow up with PCP

## 2019-05-26 ENCOUNTER — Telehealth (HOSPITAL_COMMUNITY): Payer: Self-pay

## 2019-05-26 NOTE — Telephone Encounter (Signed)
Pt insurance is active and benefits verified through Medicare A/B. Co-pay $0.00, DED $198.00/$198.00 met, out of pocket $0.00/$0.00 met, co-insurance 20%. No pre-authorization required. Passport, 05/26/2019 @ 3:08PM, HUD#[id number]  2ndary insurance is active and benefits verified through [facility]. Co-pay $0.00, DED $0.00/$0.00 met, out of pocket $0.00/$0.00 met, co-insurance 0%. No pre-authorization required.   patient will need to complete follow up appt. Once completed, patient will be contacted for scheduling upon review by the RN Navigator.

## 2019-05-27 ENCOUNTER — Ambulatory Visit
Admission: RE | Admit: 2019-05-27 | Discharge: 2019-05-27 | Disposition: A | Discharge status: Home / Self Care | Payer: Medicare Other | Source: Ambulatory Visit | Attending: Family Medicine | Admitting: Family Medicine

## 2019-05-27 DIAGNOSIS — K219 Gastro-esophageal reflux disease without esophagitis: Secondary | ICD-10-CM

## 2019-05-27 DIAGNOSIS — R1013 Epigastric pain: Secondary | ICD-10-CM

## 2019-05-28 ENCOUNTER — Telehealth (HOSPITAL_COMMUNITY): Payer: Self-pay | Admitting: Pharmacist

## 2019-05-28 DIAGNOSIS — M722 Plantar fascial fibromatosis: Secondary | ICD-10-CM | POA: Diagnosis not present

## 2019-05-28 DIAGNOSIS — M71572 Other bursitis, not elsewhere classified, left ankle and foot: Secondary | ICD-10-CM | POA: Diagnosis not present

## 2019-05-28 NOTE — Progress Notes (Signed)
Please inform patient of the following:  Ultrasound is NORMAL. No signs of gallbladder stones. If she is still having symptoms even with the acid blocker, recommend referral to GI.  Algis Greenhouse. Jerline Pain, MD 05/28/2019 12:39 PM

## 2019-05-29 ENCOUNTER — Other Ambulatory Visit: Payer: Self-pay

## 2019-05-29 DIAGNOSIS — K2100 Gastro-esophageal reflux disease with esophagitis, without bleeding: Secondary | ICD-10-CM

## 2019-05-29 NOTE — Telephone Encounter (Signed)
Cardiac Rehab Medication Review by a Pharmacist  Does the patient  feel that his/her medications are working for him/her?  Yes except for some epigastric pain on the pantoprazole. Pt attributes this pain to possibily being from something she ate. I provided information to elevate head of bed and avoid offending foods.   Has the patient been experiencing any side effects to the medications prescribed?  yes some weight loss, possibly d/t the Trulicity and Invokana, pt reports recently gaining some back.   Does the patient measure his/her own blood pressure or blood glucose at home?  yes  BP - checks occasionally, has hx of low BP  BS - checks every morning and sometimes before bed. Average readings are around 80s.   Does the patient have any problems obtaining medications due to transportation or finances?   Yes, patient is on Medicare and is having a hard time affording her Invokana and Trulicity.   Understanding of regimen: good Understanding of indications: excellent Potential of compliance: fair  Pharmacist comments: patient expressed interest in self-tapering off her topiramate but patient was advised to contact her PCP prior to discontinuing that medication. Patient also said she has a large supply of the Invokana 300 mg tablets and that her dosage was decreased by provider to 100 mg, but is splitting the 300 mg tablets in half for a dose of 150 mg. Pt states doctor is aware of this.    Thank you,   Eddie Candle, PharmD PGY-1 Pharmacy Resident  Phone: [phone]

## 2019-05-29 NOTE — Progress Notes (Signed)
Notified patient of lab results.Patient voices understanding. Referral Placed.

## 2019-05-29 NOTE — Progress Notes (Signed)
am

## 2019-06-03 ENCOUNTER — Telehealth (HOSPITAL_COMMUNITY): Payer: Self-pay | Admitting: *Deleted

## 2019-06-04 ENCOUNTER — Other Ambulatory Visit: Payer: Self-pay

## 2019-06-04 ENCOUNTER — Encounter (HOSPITAL_COMMUNITY): Payer: Self-pay

## 2019-06-04 ENCOUNTER — Encounter (HOSPITAL_COMMUNITY)
Admission: RE | Admit: 2019-06-04 | Discharge: 2019-06-04 | Disposition: A | Discharge status: Home / Self Care | Payer: Medicare Other | Source: Ambulatory Visit | Attending: Internal Medicine | Admitting: Internal Medicine

## 2019-06-04 VITALS — BP 104/62 | HR 67 | Temp 97.9°F | Ht 71.0 in | Wt 160.9 lb

## 2019-06-04 DIAGNOSIS — Z79899 Other long term (current) drug therapy: Secondary | ICD-10-CM | POA: Insufficient documentation

## 2019-06-04 DIAGNOSIS — E785 Hyperlipidemia, unspecified: Secondary | ICD-10-CM | POA: Insufficient documentation

## 2019-06-04 DIAGNOSIS — I10 Essential (primary) hypertension: Secondary | ICD-10-CM | POA: Insufficient documentation

## 2019-06-04 DIAGNOSIS — E890 Postprocedural hypothyroidism: Secondary | ICD-10-CM | POA: Insufficient documentation

## 2019-06-04 DIAGNOSIS — M722 Plantar fascial fibromatosis: Secondary | ICD-10-CM | POA: Diagnosis not present

## 2019-06-04 DIAGNOSIS — Z7984 Long term (current) use of oral hypoglycemic drugs: Secondary | ICD-10-CM | POA: Insufficient documentation

## 2019-06-04 DIAGNOSIS — Z7982 Long term (current) use of aspirin: Secondary | ICD-10-CM | POA: Insufficient documentation

## 2019-06-04 DIAGNOSIS — M71572 Other bursitis, not elsewhere classified, left ankle and foot: Secondary | ICD-10-CM | POA: Diagnosis not present

## 2019-06-04 DIAGNOSIS — I208 Other forms of angina pectoris: Secondary | ICD-10-CM | POA: Insufficient documentation

## 2019-06-04 DIAGNOSIS — E1142 Type 2 diabetes mellitus with diabetic polyneuropathy: Secondary | ICD-10-CM | POA: Insufficient documentation

## 2019-06-04 DIAGNOSIS — Z9884 Bariatric surgery status: Secondary | ICD-10-CM | POA: Insufficient documentation

## 2019-06-04 DIAGNOSIS — Z79890 Hormone replacement therapy (postmenopausal): Secondary | ICD-10-CM | POA: Insufficient documentation

## 2019-06-04 HISTORY — DX: Atherosclerotic heart disease of native coronary artery without angina pectoris: I25.10

## 2019-06-04 NOTE — Progress Notes (Signed)
Cardiac Individual Treatment Plan  Patient Details  Name: Savannah [name] MRN: [id number] Date of Birth: April 22, 1956 Referring Provider:     CARDIAC REHAB PHASE II ORIENTATION from 06/04/2019 in [facility]  Referring Provider  Elouise Munroe MD       Initial Encounter Date:    CARDIAC REHAB PHASE II ORIENTATION from 06/04/2019 in [place]  Date  06/04/19      Visit Diagnosis: Stable angina ([place])  Patient's Home Medications on Admission:  Current Outpatient Medications:  .  aspirin EC 81 MG tablet, Take 1 tablet (81 mg total) by mouth daily. Starting 01/18/19 prior to cardiac cath, Disp: , Rfl:  .  atorvastatin (LIPITOR) 40 MG tablet, Take 1 tablet (40 mg total) by mouth daily. (Patient taking differently: Take 40 mg by mouth at bedtime. ), Disp: 90 tablet, Rfl: 3 .  Biotin-D POWD, Take 1 tablet by mouth daily., Disp: , Rfl:  .  Cholecalciferol (VITAMIN D3) 125 MCG (5000 UT) CAPS, Take 5,000 Units by mouth daily., Disp: , Rfl:  .  Coenzyme Q10 (COQ-10) 400 MG CAPS, Take 400 mg by mouth every 3 (three) days. , Disp: , Rfl:  .  Colchicine (MITIGARE) 0.6 MG CAPS, Take 1 capsule by mouth daily., Disp: 90 capsule, Rfl: 1 .  diclofenac sodium (VOLTAREN) 1 % GEL, Apply 4 g topically 4 (four) times daily., Disp: 100 g, Rfl: 11 .  docusate sodium (COLACE) 100 MG capsule, Take 200 mg by mouth 2 (two) times daily., Disp: , Rfl:  .  gabapentin (NEURONTIN) 300 MG capsule, Take 2 capsules (600 mg total) by mouth 2 (two) times daily., Disp: 360 capsule, Rfl: 2 .  halobetasol (ULTRAVATE) 0.05 % cream, Apply 1 application topically 2 (two) times daily as needed (lichen sclerosus). , Disp: , Rfl: 2 .  HYDROcodone-acetaminophen (NORCO/VICODIN) 5-325 MG tablet, Take 1 tablet by mouth daily as needed for moderate pain or severe pain., Disp: , Rfl:  .  hydrOXYzine (VISTARIL) 25 MG capsule, Take 25 mg by mouth at bedtime. Prn for itching, Disp: ,  Rfl:  .  INVOKANA 300 MG TABS tablet, TAKE 1 TABLET BY MOUTH EVERY DAY BEFORE BREAKFAST (Patient taking differently: 1/2 tablet daily), Disp: 90 tablet, Rfl: 0 .  levocetirizine (XYZAL ALLERGY 24HR) 5 MG tablet, Take 1 tablet by mouth daily., Disp: , Rfl:  .  levothyroxine (SYNTHROID, LEVOTHROID) 112 MCG tablet, Take 112 mcg by mouth daily before breakfast., Disp: , Rfl:  .  MAGNESIUM PO, Take 1 tablet by mouth daily as needed (cramps). , Disp: , Rfl:  .  metFORMIN (GLUCOPHAGE-XR) 500 MG 24 hr tablet, Take 2 tablets (1,000 mg total) by mouth daily. (Patient taking differently: Take 500 mg by mouth 2 (two) times a day. ), Disp: 180 tablet, Rfl: 3 .  Multiple Vitamin (MULTIVITAMIN) tablet, Take 1 tablet by mouth daily. , Disp: , Rfl:  .  nitroGLYCERIN (NITROSTAT) 0.4 MG SL tablet, Place 1 tablet (0.4 mg total) under the tongue every 5 (five) minutes as needed., Disp: 25 tablet, Rfl: 3 .  ONE TOUCH ULTRA TEST test strip, USE TO CHECK BLOOD SUGAR 1 TIME PER DAY., Disp: 100 each, Rfl: 5 .  pantoprazole (PROTONIX) 40 MG tablet, TAKE 1 TABLET BY MOUTH EVERY DAY, Disp: 90 tablet, Rfl: 1 .  phenazopyridine (PYRIDIUM) 100 MG tablet, Take 100 mg by mouth 3 (three) times daily as needed for pain., Disp: , Rfl:  .  Probiotic  CAPS, Take 1 capsule by mouth daily., Disp: , Rfl:  .  repaglinide (PRANDIN) 2 MG tablet, TAKE 1 TABLET BY MOUTH 3 TIMES A DAY BEFORE MEALS (Patient taking differently: 2 (two) times daily before a meal. Sometimes takes at night before bedtime), Disp: 270 tablet, Rfl: 1 .  topiramate (TOPAMAX) 50 MG tablet, TAKE 2 TABLETS BY MOUTH TWICE A DAY, Disp: 60 tablet, Rfl: 3 .  traMADol (ULTRAM) 50 MG tablet, Take 50 mg by mouth 2 (two) times daily as needed for moderate pain., Disp: , Rfl:  .  TRULICITY 1.5 KG/4.0NU SOPN, INJECT 1.5 MG INTO THE SKIN ONCE A WEEK. (Patient taking differently: Inject 1.5 mg as directed every Monday. ), Disp: 6 pen, Rfl: 11 .  TURMERIC CURCUMIN PO, Take 1 capsule by  mouth daily., Disp: , Rfl:  .  canagliflozin (INVOKANA) 100 MG TABS tablet, Take 1 tablet (100 mg total) by mouth daily before breakfast., Disp: 90 tablet, Rfl: 3  Past Medical History: Past Medical History:  Diagnosis Date  . ABDOMINAL PAIN, CHRONIC 10/20/2009  . Acute cystitis 08/17/2009  . ALLERGIC RHINITIS 11/21/2007  . Allergy   . ASYMPTOMATIC POSTMENOPAUSAL STATUS 09/29/2008  . Bariatric surgery status 07/07/2010  . Bariatric surgery status 07/07/2010   Qualifier: Diagnosis of  By: Loanne Drilling MD, Jacelyn Pi   . Cancer ([place])    uterine  . Cataract   . Coronary artery disease   . DEPRESSION 11/21/2007  . DIABETES MELLITUS, TYPE II 07/08/2007  . DYSPNEA 05/20/2009  . Edema 05/20/2009  . Eosinophilic esophagitis 12/19/2534  . FEVER UNSPECIFIED 10/20/2009  . FEVER, HX OF 03/09/2010  . GERD (gastroesophageal reflux disease)   . Headache(784.0) 02/06/2010  . Hepatomegaly 01/06/2008  . HYPERLIPIDEMIA 07/08/2007  . HYPERTENSION 07/08/2007  . LEUKOPENIA, MILD 09/29/2008  . OBESITY 01/06/2008  . OSTEOARTHRITIS 01/06/2008  . Other chronic nonalcoholic liver disease 6/44/0347  . PERIPHERAL NEUROPATHY 11/21/2007  . Peripheral neuropathy   . Postsurgical hypothyroidism 09/19/2010  . SINUSITIS- ACUTE-NOS 11/21/2007  . TB SKIN TEST, POSITIVE 02/06/2010  . THYROID NODULE 03/09/2010    Tobacco Use: Social History   Tobacco Use  Smoking Status Former Smoker  . Packs/day: 1.50  . Years: 15.00  . Pack years: 22.50  . Types: Cigarettes  Smokeless Tobacco Never Used    Labs: Recent Review Flowsheet Data    Labs for ITP Cardiac and Pulmonary Rehab Latest Ref Rng & Units 12/27/2017 07/04/2018 01/09/2019 04/29/2019 05/14/2019   Cholestrol 100 - 199 mg/dL - 166 - 136 -   LDLCALC 0 - 99 mg/dL - 95 - 69 -   LDLDIRECT mg/dL - - - - -   HDL >39 mg/dL - 45.80 - 47 -   Trlycerides 0 - 149 mg/dL - 123.0 - 102 -   Hemoglobin A1c 4.0 - 5.6 % 6.5 6.3(A) 6.9(A) - 6.0(A)      Capillary Blood Glucose: Lab Results  Component  Value Date   GLUCAP 101 (H) 01/20/2019   GLUCAP 252 (H) 10/22/2008   GLUCAP 212 (H) 10/22/2008   GLUCAP 216 (H) 10/21/2008   GLUCAP 238 (H) 10/21/2008     Exercise Target Goals: Exercise Program Goal: Individual exercise prescription set using results from initial 6 min walk test and THRR while considering  patient's activity barriers and safety.   Exercise Prescription Goal: Starting with aerobic activity 30 plus minutes a day, 3 days per week for initial exercise prescription. Provide home exercise prescription and guidelines that participant acknowledges understanding prior to  discharge.  Activity Barriers & Risk Stratification: Activity Barriers & Cardiac Risk Stratification - 06/04/19 1152      Activity Barriers & Cardiac Risk Stratification   Activity Barriers  Joint Problems;History of Falls;Balance Concerns    Cardiac Risk Stratification  High       6 Minute Walk: 6 Minute Walk    Row Name 06/04/19 1151         6 Minute Walk   Phase  Initial     Distance  1772 feet     Walk Time  6 minutes     # of Rest Breaks  0     MPH  3.4     METS  4.2     RPE  12     Perceived Dyspnea   0     VO2 Peak  14.72     Symptoms  No     Resting HR  67 bpm     Resting BP  104/62     Resting Oxygen Saturation   98 %     Exercise Oxygen Saturation  during 6 min walk  98 %     Max Ex. HR  102 bpm     Max Ex. BP  104/62     2 Minute Post BP  100/62        Oxygen Initial Assessment:   Oxygen Re-Evaluation:   Oxygen Discharge (Final Oxygen Re-Evaluation):   Initial Exercise Prescription: Initial Exercise Prescription - 06/04/19 1100      Date of Initial Exercise RX and Referring Provider   Date  06/04/19    Referring Provider  Elouise Munroe MD     Expected Discharge Date  07/17/19      NuStep   Level  1    SPM  75    Minutes  15    METs  2.5      Arm Ergometer   Level  1    Watts  25    Minutes  15    METs  2.5      Prescription Details   Frequency  (times per week)  3x    Duration  Progress to 30 minutes of continuous aerobic without signs/symptoms of physical distress      Intensity   THRR 40-80% of Max Heartrate  62-124    Ratings of Perceived Exertion  11-13    Perceived Dyspnea  0-4      Progression   Progression  Continue progressive overload as per policy without signs/symptoms or physical distress.      Resistance Training   Training Prescription  Yes    Weight  1lb    Reps  10-15       Perform Capillary Blood Glucose checks as needed.  Exercise Prescription Changes:   Exercise Comments:   Exercise Goals and Review: Exercise Goals    Row Name 06/04/19 1152             Exercise Goals   Increase Physical Activity  Yes       Intervention  Provide advice, education, support and counseling about physical activity/exercise needs.;Develop an individualized exercise prescription for aerobic and resistive training based on initial evaluation findings, risk stratification, comorbidities and participant's personal goals.       Expected Outcomes  Short Term: Attend rehab on a regular basis to increase amount of physical activity.;Long Term: Add in home exercise to make exercise part of routine and to increase amount of physical activity.;Long Term:  Exercising regularly at least 3-5 days a week.       Increase Strength and Stamina  Yes       Intervention  Provide advice, education, support and counseling about physical activity/exercise needs.;Develop an individualized exercise prescription for aerobic and resistive training based on initial evaluation findings, risk stratification, comorbidities and participant's personal goals.       Expected Outcomes  Short Term: Increase workloads from initial exercise prescription for resistance, speed, and METs.;Short Term: Perform resistance training exercises routinely during rehab and add in resistance training at home;Long Term: Improve cardiorespiratory fitness, muscular endurance  and strength as measured by increased METs and functional capacity (6MWT)       Able to understand and use rate of perceived exertion (RPE) scale  Yes       Intervention  Provide education and explanation on how to use RPE scale       Expected Outcomes  Short Term: Able to use RPE daily in rehab to express subjective intensity level;Long Term:  Able to use RPE to guide intensity level when exercising independently       Knowledge and understanding of Target Heart Rate Range (THRR)  Yes       Intervention  Provide education and explanation of THRR including how the numbers were predicted and where they are located for reference       Expected Outcomes  Short Term: Able to state/look up THRR;Long Term: Able to use THRR to govern intensity when exercising independently;Short Term: Able to use daily as guideline for intensity in rehab       Able to check pulse independently  Yes       Intervention  Provide education and demonstration on how to check pulse in carotid and radial arteries.;Review the importance of being able to check your own pulse for safety during independent exercise       Expected Outcomes  Short Term: Able to explain why pulse checking is important during independent exercise;Long Term: Able to check pulse independently and accurately       Understanding of Exercise Prescription  Yes       Intervention  Provide education, explanation, and written materials on patient's individual exercise prescription       Expected Outcomes  Long Term: Able to explain home exercise prescription to exercise independently;Short Term: Able to explain program exercise prescription          Exercise Goals Re-Evaluation :    Discharge Exercise Prescription (Final Exercise Prescription Changes):   Nutrition:  Target Goals: Understanding of nutrition guidelines, daily intake of sodium 1500mg , cholesterol 200mg , calories 30% from fat and 7% or less from saturated fats, daily to have 5 or more  servings of fruits and vegetables.  Biometrics: Pre Biometrics - 06/04/19 1153      Pre Biometrics   Height  5\' 11"  (1.803 m)    Weight  73 kg    Waist Circumference  34 inches    Hip Circumference  40 inches    Waist to Hip Ratio  0.85 %    BMI (Calculated)  22.46    Triceps Skinfold  26 mm    % Body Fat  31.4 %    Grip Strength  22 kg    Flexibility  0 in    Single Leg Stand  2.81 seconds        Nutrition Therapy Plan and Nutrition Goals:   Nutrition Assessments:   Nutrition Goals Re-Evaluation:   Nutrition Goals Discharge (  Final Nutrition Goals Re-Evaluation):   Psychosocial: Target Goals: Acknowledge presence or absence of significant depression and/or stress, maximize coping skills, provide positive support system. Participant is able to verbalize types and ability to use techniques and skills needed for reducing stress and depression.  Initial Review & Psychosocial Screening: Initial Psych Review & Screening - 06/04/19 1102      Initial Review   Current issues with  None Identified;History of Depression      Family Dynamics   Good Support System?  Yes   Mare lives alone and has her sister and friends that live nearby for support     Barriers   Psychosocial barriers to participate in program  There are no identifiable barriers or psychosocial needs.      Screening Interventions   Interventions  Encouraged to exercise       Quality of Life Scores: Quality of Life - 06/04/19 1148      Quality of Life   Select  Quality of Life      Quality of Life Scores   Health/Function Pre  22.27 %    Socioeconomic Pre  25.25 %    Psych/Spiritual Pre  22.07 %    Family Pre  28.75 %    GLOBAL Pre  23.32 %      Scores of 19 and below usually indicate a poorer quality of life in these areas.  A difference of  2-3 points is a clinically meaningful difference.  A difference of 2-3 points in the total score of the Quality of Life Index has been associated with  significant improvement in overall quality of life, self-image, physical symptoms, and general health in studies assessing change in quality of life.  PHQ-9: Recent Review Flowsheet Data    Depression screen [facility] 2/9 06/04/2019   Decreased Interest 0   Down, Depressed, Hopeless 0   PHQ - 2 Score 0     Interpretation of Total Score  Total Score Depression Severity:  1-4 = Minimal depression, 5-9 = Mild depression, 10-14 = Moderate depression, 15-19 = Moderately severe depression, 20-27 = Severe depression   Psychosocial Evaluation and Intervention:   Psychosocial Re-Evaluation:   Psychosocial Discharge (Final Psychosocial Re-Evaluation):   Vocational Rehabilitation: Provide vocational rehab assistance to qualifying candidates.   Vocational Rehab Evaluation & Intervention: Vocational Rehab - 06/04/19 1207      Initial Vocational Rehab Evaluation & Intervention   Assessment shows need for Vocational Rehabilitation  No       Education: Education Goals: Education classes will be provided on a weekly basis, covering required topics. Participant will state understanding/return demonstration of topics presented.  Learning Barriers/Preferences: Learning Barriers/Preferences - 06/04/19 1149      Learning Barriers/Preferences   Learning Barriers  None    Learning Preferences  Skilled Demonstration;Written Material;Verbal Instruction       Education Topics: Hypertension, Hypertension Reduction -Define heart disease and high blood pressure. Discus how high blood pressure affects the body and ways to reduce high blood pressure.   Exercise and Your Heart -Discuss why it is important to exercise, the FITT principles of exercise, normal and abnormal responses to exercise, and how to exercise safely.   Angina -Discuss definition of angina, causes of angina, treatment of angina, and how to decrease risk of having angina.   Cardiac Medications -Review what the following cardiac  medications are used for, how they affect the body, and side effects that may occur when taking the medications.  Medications include Aspirin, Beta blockers,  calcium channel blockers, ACE Inhibitors, angiotensin receptor blockers, diuretics, digoxin, and antihyperlipidemics.   Congestive Heart Failure -Discuss the definition of CHF, how to live with CHF, the signs and symptoms of CHF, and how keep track of weight and sodium intake.   Heart Disease and Intimacy -Discus the effect sexual activity has on the heart, how changes occur during intimacy as we age, and safety during sexual activity.   Smoking Cessation / COPD -Discuss different methods to quit smoking, the health benefits of quitting smoking, and the definition of COPD.   Nutrition I: Fats -Discuss the types of cholesterol, what cholesterol does to the heart, and how cholesterol levels can be controlled.   Nutrition II: Labels -Discuss the different components of food labels and how to read food label   Heart Parts/Heart Disease and PAD -Discuss the anatomy of the heart, the pathway of blood circulation through the heart, and these are affected by heart disease.   Stress I: Signs and Symptoms -Discuss the causes of stress, how stress may lead to anxiety and depression, and ways to limit stress.   Stress II: Relaxation -Discuss different types of relaxation techniques to limit stress.   Warning Signs of Stroke / TIA -Discuss definition of a stroke, what the signs and symptoms are of a stroke, and how to identify when someone is having stroke.   Knowledge Questionnaire Score: Knowledge Questionnaire Score - 06/04/19 1148      Knowledge Questionnaire Score   Pre Score  22/24       Core Components/Risk Factors/Patient Goals at Admission: Personal Goals and Risk Factors at Admission - 06/04/19 1207      Core Components/Risk Factors/Patient Goals on Admission    Weight Management  Weight Maintenance    Diabetes   Yes    Intervention  Provide education about signs/symptoms and action to take for hypo/hyperglycemia.;Provide education about proper nutrition, including hydration, and aerobic/resistive exercise prescription along with prescribed medications to achieve blood glucose in normal ranges: Fasting glucose 65-99 mg/dL    Expected Outcomes  Short Term: Participant verbalizes understanding of the signs/symptoms and immediate care of hyper/hypoglycemia, proper foot care and importance of medication, aerobic/resistive exercise and nutrition plan for blood glucose control.;Long Term: Attainment of HbA1C < 7%.    Hypertension  Yes    Intervention  Provide education on lifestyle modifcations including regular physical activity/exercise, weight management, moderate sodium restriction and increased consumption of fresh fruit, vegetables, and low fat dairy, alcohol moderation, and smoking cessation.;Monitor prescription use compliance.    Expected Outcomes  Short Term: Continued assessment and intervention until BP is < 140/79mm HG in hypertensive participants. < 130/52mm HG in hypertensive participants with diabetes, heart failure or chronic kidney disease.;Long Term: Maintenance of blood pressure at goal levels.    Lipids  Yes    Intervention  Provide education and support for participant on nutrition & aerobic/resistive exercise along with prescribed medications to achieve LDL 70mg , HDL >40mg .    Expected Outcomes  Short Term: Participant states understanding of desired cholesterol values and is compliant with medications prescribed. Participant is following exercise prescription and nutrition guidelines.;Long Term: Cholesterol controlled with medications as prescribed, with individualized exercise RX and with personalized nutrition plan. Value goals: LDL < 70mg , HDL > 40 mg.       Core Components/Risk Factors/Patient Goals Review:    Core Components/Risk Factors/Patient Goals at Discharge (Final Review):     ITP Comments: ITP Comments    Row Name 06/04/19 1034  ITP Comments  Dr. Fransico Him, Medical Director          Comments: Patient attended orientation on 06/04/2019 to review rules and guidelines for program.  Completed 6 minute walk test, Intitial ITP, and exercise prescription.  VSS. Telemetry-Sinus Rhythm.  Asymptomatic. Safety measures and social distancing in place per CDC guidelines.Barnet Pall, RN,BSN 06/04/2019 12:16 PM

## 2019-06-08 ENCOUNTER — Encounter (HOSPITAL_COMMUNITY)
Admission: RE | Admit: 2019-06-08 | Discharge: 2019-06-08 | Disposition: A | Discharge status: Home / Self Care | Payer: Medicare Other | Source: Ambulatory Visit | Attending: Internal Medicine | Admitting: Internal Medicine

## 2019-06-08 ENCOUNTER — Other Ambulatory Visit: Payer: Self-pay

## 2019-06-08 DIAGNOSIS — E785 Hyperlipidemia, unspecified: Secondary | ICD-10-CM | POA: Diagnosis not present

## 2019-06-08 DIAGNOSIS — E890 Postprocedural hypothyroidism: Secondary | ICD-10-CM | POA: Diagnosis not present

## 2019-06-08 DIAGNOSIS — I10 Essential (primary) hypertension: Secondary | ICD-10-CM | POA: Diagnosis not present

## 2019-06-08 DIAGNOSIS — I208 Other forms of angina pectoris: Secondary | ICD-10-CM

## 2019-06-08 DIAGNOSIS — Z9884 Bariatric surgery status: Secondary | ICD-10-CM | POA: Diagnosis not present

## 2019-06-08 DIAGNOSIS — Z7984 Long term (current) use of oral hypoglycemic drugs: Secondary | ICD-10-CM | POA: Diagnosis not present

## 2019-06-08 DIAGNOSIS — Z79890 Hormone replacement therapy (postmenopausal): Secondary | ICD-10-CM | POA: Diagnosis not present

## 2019-06-08 DIAGNOSIS — E1142 Type 2 diabetes mellitus with diabetic polyneuropathy: Secondary | ICD-10-CM | POA: Diagnosis not present

## 2019-06-08 DIAGNOSIS — Z7982 Long term (current) use of aspirin: Secondary | ICD-10-CM | POA: Diagnosis not present

## 2019-06-08 DIAGNOSIS — Z79899 Other long term (current) drug therapy: Secondary | ICD-10-CM | POA: Diagnosis not present

## 2019-06-08 LAB — GLUCOSE, CAPILLARY
Glucose-Capillary: 117 mg/dL — ABNORMAL HIGH (ref 70–99)
Glucose-Capillary: 75 mg/dL (ref 70–99)

## 2019-06-08 NOTE — Progress Notes (Signed)
Daily Session Note  Patient Details  Name: Savannah [name] MRN: [id number] Date of Birth: Jul 25, 1956 Referring Provider:     CARDIAC REHAB PHASE II ORIENTATION from 06/04/2019 in [place]  Referring Provider  Elouise Munroe MD       Encounter Date: 06/08/2019  Check In: Session Check In - 06/08/19 1404      Check-In   Supervising physician immediately available to respond to emergencies  [facility] immediately available    Physician(s)  Dr. Nevada Crane    Location  [facility]    Staff Present  Deitra Mayo, BS, ACSM CEP, Exercise Physiologist;Tara Karle Starch, RN, Mosie Epstein, MS,ACSM CEP, Exercise Physiologist;Maria Whitaker, RN, Deland Pretty, MS, ACSM CEP, Exercise Physiologist    Virtual Visit  No    Medication changes reported      No    Fall or balance concerns reported     No    Tobacco Cessation  No Change    Warm-up and Cool-down  Performed on first and last piece of equipment    Resistance Training Performed  Yes    VAD Patient?  No    PAD/SET Patient?  No      Pain Assessment   Currently in Pain?  No/denies    Multiple Pain Sites  No       Capillary Blood Glucose: Results for orders placed or performed during the hospital encounter of 06/08/19 (from the past 24 hour(s))  Glucose, capillary     Status: Abnormal   Collection Time: 06/08/19  1:53 PM  Result Value Ref Range   Glucose-Capillary 117 (H) 70 - 99 mg/dL  Glucose, capillary     Status: None   Collection Time: 06/08/19  2:44 PM  Result Value Ref Range   Glucose-Capillary 75 70 - 99 mg/dL      Social History   Tobacco Use  Smoking Status Former Smoker  . Packs/day: 1.50  . Years: 15.00  . Pack years: 22.50  . Types: Cigarettes  Smokeless Tobacco Never Used    Goals Met:  Exercise tolerated well  Goals Unmet:  Not Applicable  Comments: Pt started cardiac rehab today.  Pt tolerated light exercise without difficulty. VSS,  telemetry-Sinus Rhythm, asymptomatic.  Medication list reconciled. Pt denies barriers to medicaiton compliance.  PSYCHOSOCIAL ASSESSMENT:  PHQ-0. Pt exhibits positive coping skills, hopeful outlook with supportive family. No psychosocial needs identified at this time, no psychosocial interventions necessary.    Pt enjoys reading.   Pt oriented to exercise equipment and routine.    Understanding verbalized. Post exercise CBG 75. Patient asymptomatic. Patient was given Ginger Ale and graham crackers. Repeat CBG 118. Patient had eaten cereal around 11 AM to eat a heart healthy snack prior to coming to exercise or to eat her meal later. Patient states understanding and was given a hand out on heart healthy diabetic snacks.Barnet Pall, RN,BSN 06/08/2019 4:14 PM   Dr. Fransico Him is Medical Director for Cardiac Rehab at [facility].

## 2019-06-09 LAB — GLUCOSE, CAPILLARY: Glucose-Capillary: 118 mg/dL — ABNORMAL HIGH (ref 70–99)

## 2019-06-10 ENCOUNTER — Encounter (HOSPITAL_COMMUNITY)
Admission: RE | Admit: 2019-06-10 | Discharge: 2019-06-10 | Disposition: A | Discharge status: Home / Self Care | Payer: Medicare Other | Source: Ambulatory Visit | Attending: Internal Medicine | Admitting: Internal Medicine

## 2019-06-10 ENCOUNTER — Other Ambulatory Visit: Payer: Self-pay

## 2019-06-10 DIAGNOSIS — I10 Essential (primary) hypertension: Secondary | ICD-10-CM | POA: Diagnosis not present

## 2019-06-10 DIAGNOSIS — I208 Other forms of angina pectoris: Secondary | ICD-10-CM

## 2019-06-10 DIAGNOSIS — Z79899 Other long term (current) drug therapy: Secondary | ICD-10-CM | POA: Diagnosis not present

## 2019-06-10 DIAGNOSIS — Z79890 Hormone replacement therapy (postmenopausal): Secondary | ICD-10-CM | POA: Diagnosis not present

## 2019-06-10 DIAGNOSIS — E1140 Type 2 diabetes mellitus with diabetic neuropathy, unspecified: Secondary | ICD-10-CM

## 2019-06-10 DIAGNOSIS — Z7984 Long term (current) use of oral hypoglycemic drugs: Secondary | ICD-10-CM | POA: Diagnosis not present

## 2019-06-10 DIAGNOSIS — Z7982 Long term (current) use of aspirin: Secondary | ICD-10-CM | POA: Diagnosis not present

## 2019-06-10 LAB — GLUCOSE, CAPILLARY: Glucose-Capillary: 143 mg/dL — ABNORMAL HIGH (ref 70–99)

## 2019-06-10 MED ORDER — GLUCOSE BLOOD VI STRP: 1.0000 | ORAL_STRIP | Freq: Every day | 2 refills | Status: DC

## 2019-06-12 ENCOUNTER — Other Ambulatory Visit: Payer: Self-pay

## 2019-06-12 ENCOUNTER — Encounter (HOSPITAL_COMMUNITY)
Admission: RE | Admit: 2019-06-12 | Discharge: 2019-06-12 | Disposition: A | Discharge status: Home / Self Care | Payer: Medicare Other | Source: Ambulatory Visit | Attending: Internal Medicine | Admitting: Internal Medicine

## 2019-06-12 DIAGNOSIS — I10 Essential (primary) hypertension: Secondary | ICD-10-CM | POA: Diagnosis not present

## 2019-06-12 DIAGNOSIS — Z79890 Hormone replacement therapy (postmenopausal): Secondary | ICD-10-CM | POA: Diagnosis not present

## 2019-06-12 DIAGNOSIS — I208 Other forms of angina pectoris: Secondary | ICD-10-CM | POA: Diagnosis not present

## 2019-06-12 DIAGNOSIS — Z7982 Long term (current) use of aspirin: Secondary | ICD-10-CM | POA: Diagnosis not present

## 2019-06-12 DIAGNOSIS — Z79899 Other long term (current) drug therapy: Secondary | ICD-10-CM | POA: Diagnosis not present

## 2019-06-12 DIAGNOSIS — Z7984 Long term (current) use of oral hypoglycemic drugs: Secondary | ICD-10-CM | POA: Diagnosis not present

## 2019-06-15 ENCOUNTER — Encounter (HOSPITAL_COMMUNITY)
Admission: RE | Admit: 2019-06-15 | Discharge: 2019-06-15 | Disposition: A | Discharge status: Home / Self Care | Payer: Medicare Other | Source: Ambulatory Visit | Attending: Internal Medicine | Admitting: Internal Medicine

## 2019-06-15 ENCOUNTER — Other Ambulatory Visit: Payer: Self-pay

## 2019-06-15 DIAGNOSIS — I10 Essential (primary) hypertension: Secondary | ICD-10-CM | POA: Insufficient documentation

## 2019-06-15 DIAGNOSIS — E785 Hyperlipidemia, unspecified: Secondary | ICD-10-CM | POA: Diagnosis not present

## 2019-06-15 DIAGNOSIS — E1142 Type 2 diabetes mellitus with diabetic polyneuropathy: Secondary | ICD-10-CM | POA: Insufficient documentation

## 2019-06-15 DIAGNOSIS — Z9884 Bariatric surgery status: Secondary | ICD-10-CM | POA: Insufficient documentation

## 2019-06-15 DIAGNOSIS — Z79890 Hormone replacement therapy (postmenopausal): Secondary | ICD-10-CM | POA: Diagnosis not present

## 2019-06-15 DIAGNOSIS — E890 Postprocedural hypothyroidism: Secondary | ICD-10-CM | POA: Insufficient documentation

## 2019-06-15 DIAGNOSIS — Z79899 Other long term (current) drug therapy: Secondary | ICD-10-CM | POA: Insufficient documentation

## 2019-06-15 DIAGNOSIS — Z7982 Long term (current) use of aspirin: Secondary | ICD-10-CM | POA: Diagnosis not present

## 2019-06-15 DIAGNOSIS — I208 Other forms of angina pectoris: Secondary | ICD-10-CM | POA: Insufficient documentation

## 2019-06-15 DIAGNOSIS — Z7984 Long term (current) use of oral hypoglycemic drugs: Secondary | ICD-10-CM | POA: Insufficient documentation

## 2019-06-15 LAB — GLUCOSE, CAPILLARY: Glucose-Capillary: 155 mg/dL — ABNORMAL HIGH (ref 70–99)

## 2019-06-15 MED ORDER — TOPIRAMATE 50 MG PO TABS: 100.0000 mg | ORAL_TABLET | Freq: Two times a day (BID) | ORAL | 3 refills | Status: DC

## 2019-06-17 ENCOUNTER — Encounter (HOSPITAL_COMMUNITY)
Admission: RE | Admit: 2019-06-17 | Discharge: 2019-06-17 | Disposition: A | Discharge status: Home / Self Care | Payer: Medicare Other | Source: Ambulatory Visit | Attending: Internal Medicine | Admitting: Internal Medicine

## 2019-06-17 ENCOUNTER — Other Ambulatory Visit: Payer: Self-pay

## 2019-06-17 DIAGNOSIS — I10 Essential (primary) hypertension: Secondary | ICD-10-CM | POA: Diagnosis not present

## 2019-06-17 DIAGNOSIS — Z7982 Long term (current) use of aspirin: Secondary | ICD-10-CM | POA: Diagnosis not present

## 2019-06-17 DIAGNOSIS — I208 Other forms of angina pectoris: Secondary | ICD-10-CM

## 2019-06-17 DIAGNOSIS — Z79899 Other long term (current) drug therapy: Secondary | ICD-10-CM | POA: Diagnosis not present

## 2019-06-17 DIAGNOSIS — Z79890 Hormone replacement therapy (postmenopausal): Secondary | ICD-10-CM | POA: Diagnosis not present

## 2019-06-17 DIAGNOSIS — Z7984 Long term (current) use of oral hypoglycemic drugs: Secondary | ICD-10-CM | POA: Diagnosis not present

## 2019-06-18 ENCOUNTER — Ambulatory Visit (INDEPENDENT_AMBULATORY_CARE_PROVIDER_SITE_OTHER): Payer: Medicare Other | Admitting: Gastroenterology

## 2019-06-18 ENCOUNTER — Encounter: Payer: Self-pay | Admitting: Gastroenterology

## 2019-06-18 ENCOUNTER — Other Ambulatory Visit (INDEPENDENT_AMBULATORY_CARE_PROVIDER_SITE_OTHER): Payer: Medicare Other

## 2019-06-18 VITALS — BP 94/64 | HR 84 | Temp 97.9°F | Ht 70.5 in | Wt 157.1 lb

## 2019-06-18 DIAGNOSIS — R1084 Generalized abdominal pain: Secondary | ICD-10-CM

## 2019-06-18 DIAGNOSIS — R1013 Epigastric pain: Secondary | ICD-10-CM

## 2019-06-18 DIAGNOSIS — R198 Other specified symptoms and signs involving the digestive system and abdomen: Secondary | ICD-10-CM

## 2019-06-18 DIAGNOSIS — I208 Other forms of angina pectoris: Secondary | ICD-10-CM

## 2019-06-18 LAB — BASIC METABOLIC PANEL
BUN: 15 mg/dL (ref 6–23)
CO2: 26 mEq/L (ref 19–32)
Calcium: 9.3 mg/dL (ref 8.4–10.5)
Chloride: 111 mEq/L (ref 96–112)
Creatinine, Ser: 0.99 mg/dL (ref 0.40–1.20)
GFR: 56.27 mL/min — ABNORMAL LOW (ref >60.00)
Glucose, Bld: 86 mg/dL (ref 70–99)
Potassium: 3.9 mEq/L (ref 3.5–5.1)
Sodium: 143 mEq/L (ref 135–145)

## 2019-06-18 NOTE — Progress Notes (Signed)
06/18/2019 MACHEL VIOLANTE [id number] 1956/06/21   HISTORY OF PRESENT ILLNESS:  This is a 63 year old female who is a patient of Dr. Doyne Keel.  She presents here today with several complaints and a long story.  Reports pain on both sides/flanks like a "stitch" in her side.  Left side is more constant.  Also says that 2 weeks ago she had low chest/epigastric abdominal pain.  Ultrasound was performed and was normal so "gallbladder checked out."  Reports temp of 100.9 and chills with that as well as nausea.  Also reports constipation.  Sounds like from her description but says that when she goes a day or so without a BM then her low back aches.  She is on several medications including pantoprazole 40 mg daily and probiotics from a GI standpoint.  EGD by Dr. Havery Moros in 01/2017 showed the following: - Normal esophagus, no evidence of EoE - biopsies taken to rule out EoE. - Roux-en-Y gastrojejunostomy with gastrojejunal anastomosis characterized by healthy appearing mucosa. - A few benign appearing gastric polyps. Resected and retrieved. - Larger than expected gastric pouch. - Normal examined small bowel limb.  Colonoscopy by Dr. Havery Moros was 01/2017 at which time she was found to have one 5 mm polyp that was removed, looping of the colon, and internal hemorrhoids.   Past Medical History:  Diagnosis Date  . ABDOMINAL PAIN, CHRONIC 10/20/2009  . Acute cystitis 08/17/2009  . ALLERGIC RHINITIS 11/21/2007  . Allergy   . ASYMPTOMATIC POSTMENOPAUSAL STATUS 09/29/2008  . Bariatric surgery status 07/07/2010  . Bariatric surgery status 07/07/2010   Qualifier: Diagnosis of  By: Loanne Drilling MD, Jacelyn Pi   . Cancer ([place])    uterine  . Cataract   . Coronary artery disease   . DEPRESSION 11/21/2007  . DIABETES MELLITUS, TYPE II 07/08/2007  . DYSPNEA 05/20/2009  . Edema 05/20/2009  . Eosinophilic esophagitis 02/16/9628  . FEVER UNSPECIFIED 10/20/2009  . FEVER, HX OF 03/09/2010  . GERD (gastroesophageal reflux  disease)   . Headache(784.0) 02/06/2010  . Hepatomegaly 01/06/2008  . HYPERLIPIDEMIA 07/08/2007  . HYPERTENSION 07/08/2007  . LEUKOPENIA, MILD 09/29/2008  . OBESITY 01/06/2008  . OSTEOARTHRITIS 01/06/2008  . Other chronic nonalcoholic liver disease 04/09/4131  . PERIPHERAL NEUROPATHY 11/21/2007  . Peripheral neuropathy   . Postsurgical hypothyroidism 09/19/2010  . SINUSITIS- ACUTE-NOS 11/21/2007  . TB SKIN TEST, POSITIVE 02/06/2010  . THYROID NODULE 03/09/2010   Past Surgical History:  Procedure Laterality Date  . ABDOMINAL HYSTERECTOMY    . BASAL CELL CARCINOMA EXCISION    . CARDIAC CATHETERIZATION    . COLONOSCOPY    . EYE SURGERY Bilateral 08/2018, 09/2018  . KNEE ARTHROSCOPY Left   . LEFT HEART CATH AND CORONARY ANGIOGRAPHY N/A 01/20/2019   Procedure: LEFT HEART CATH AND CORONARY ANGIOGRAPHY;  Surgeon: Belva Crome, MD;  Location: [facility];  Service: Cardiovascular;  Laterality: N/A;  . LYMPH NODE DISSECTION    . OOPHORECTOMY    . Right total knee replacement    . ROUX-EN-Y GASTRIC BYPASS  2011  . SKIN TAG REMOVAL    . THYROIDECTOMY    . TONSILLECTOMY AND ADENOIDECTOMY    . UPPER GASTROINTESTINAL ENDOSCOPY      reports that she has quit smoking. Her smoking use included cigarettes. She has a 22.50 pack-year smoking history. She has never used smokeless tobacco. She reports current alcohol use. She reports that she does not use drugs. family history includes Breast cancer in her  maternal aunt and sister; Colon cancer in her maternal uncle; Congestive Heart Failure in her father; Diabetes in her father, maternal aunt, maternal grandmother, maternal uncle, mother, and paternal grandmother; Hypertension in her father; Liver cancer in her maternal aunt; Multiple sclerosis in her sister; Myelodysplastic syndrome in her father; Osteoporosis in her sister. Allergies  Allergen Reactions  . Ace Inhibitors Cough  . Caffeine     PVCs  . Ciprofloxacin Itching  . Morphine And Related  Itching    Pt tolerates medication if given with diphenhydramine  . Mucinex [Guaifenesin Er]     PVCs  . Nsaids     Gastric Bypass Surgery - unable to take, tolerates ec 81 aspirin   . Other     Antihistamine-alkylamine - PVCs tolerates benadryl       Outpatient Encounter Medications as of 06/18/2019  Medication Sig  . aspirin EC 81 MG tablet Take 1 tablet (81 mg total) by mouth daily. Starting 01/18/19 prior to cardiac cath  . Biotin-D POWD Take 1 tablet by mouth daily.  . Cholecalciferol (VITAMIN D3) 125 MCG (5000 UT) CAPS Take 5,000 Units by mouth daily.  . Coenzyme Q10 (COQ-10) 400 MG CAPS Take 400 mg by mouth every 3 (three) days.   . Colchicine (MITIGARE) 0.6 MG CAPS Take 1 capsule by mouth daily.  . diclofenac sodium (VOLTAREN) 1 % GEL Apply 4 g topically 4 (four) times daily.  [name] docusate sodium (COLACE) 100 MG capsule Take 200 mg by mouth 2 (two) times daily.  [name] gabapentin (NEURONTIN) 300 MG capsule Take 2 capsules (600 mg total) by mouth 2 (two) times daily.  [name] glucose blood test strip 1 each by Other route daily. Use to monitor glucose levels once per day; E11.40  . halobetasol (ULTRAVATE) 0.05 % cream Apply 1 application topically 2 (two) times daily as needed (lichen sclerosus).   [name] HYDROcodone-acetaminophen (NORCO/VICODIN) 5-325 MG tablet Take 1 tablet by mouth daily as needed for moderate pain or severe pain.  . hydrOXYzine (VISTARIL) 25 MG capsule Take 25 mg by mouth at bedtime. Prn for itching  . levocetirizine (XYZAL ALLERGY 24HR) 5 MG tablet Take 1 tablet by mouth daily.  [name] levothyroxine (SYNTHROID, LEVOTHROID) 112 MCG tablet Take 112 mcg by mouth daily before breakfast.  . MAGNESIUM PO Take 1 tablet by mouth daily as needed (cramps).   . Multiple Vitamin (MULTIVITAMIN) tablet Take 1 tablet by mouth daily.   . nitroGLYCERIN (NITROSTAT) 0.4 MG SL tablet Place 1 tablet (0.4 mg total) under the tongue every 5 (five) minutes as needed.  . pantoprazole (PROTONIX) 40 MG tablet  TAKE 1 TABLET BY MOUTH EVERY DAY  . phenazopyridine (PYRIDIUM) 100 MG tablet Take 100 mg by mouth 3 (three) times daily as needed for pain.  . Probiotic CAPS Take 1 capsule by mouth daily.  . repaglinide (PRANDIN) 2 MG tablet TAKE 1 TABLET BY MOUTH 3 TIMES A DAY BEFORE MEALS (Patient taking differently: 2 (two) times daily before a meal. Sometimes takes at night before bedtime)  . topiramate (TOPAMAX) 50 MG tablet Take 2 tablets (100 mg total) by mouth 2 (two) times daily.  . traMADol (ULTRAM) 50 MG tablet Take 50 mg by mouth 2 (two) times daily as needed for moderate pain.  . TRULICITY 1.5 KD/9.8PJ SOPN INJECT 1.5 MG INTO THE SKIN ONCE A WEEK. (Patient taking differently: Inject 1.5 mg as directed every Monday. )  . TURMERIC CURCUMIN PO Take 1 capsule by mouth daily.  [name] atorvastatin (LIPITOR) 40  MG tablet Take 1 tablet (40 mg total) by mouth daily. (Patient taking differently: Take 40 mg by mouth at bedtime. )  . canagliflozin (INVOKANA) 100 MG TABS tablet Take 1 tablet (100 mg total) by mouth daily before breakfast.  . metFORMIN (GLUCOPHAGE-XR) 500 MG 24 hr tablet Take 2 tablets (1,000 mg total) by mouth daily. (Patient taking differently: Take 500 mg by mouth 2 (two) times a day. )  . [DISCONTINUED] INVOKANA 300 MG TABS tablet TAKE 1 TABLET BY MOUTH EVERY DAY BEFORE BREAKFAST (Patient taking differently: 1/2 tablet daily)   No facility-administered encounter medications on file as of 06/18/2019.      REVIEW OF SYSTEMS  : All other systems reviewed and negative except where noted in the History of Present Illness.   PHYSICAL EXAM: BP 94/64 (BP Location: Left Arm, Patient Position: Sitting, Cuff Size: Normal)   Pulse 84   Temp 97.9 F (36.6 C)   Ht 5' 10.5" (1.791 m) Comment: height measured without shoes  Wt 157 lb 2 oz (71.3 kg)   BMI 22.23 kg/m  General: Well developed white female in no acute distress Head: Normocephalic and atraumatic Eyes:  Sclerae anicteric, conjunctiva pink.  Ears: Normal auditory acuity Lungs: Clear throughout to auscultation; no increased WOB. Heart: Regular rate and rhythm; no M/R/G. Abdomen: Soft, non-distended.  BS present.  Mild diffuse TTP, more so in the epigastrium. Musculoskeletal: Symmetrical with no gross deformities  Skin: No lesions on visible extremities Extremities: No edema  Neurological: Alert oriented x 4, grossly non-focal Psychological:  Alert and cooperative. Normal mood and affect  ASSESSMENT AND PLAN: *Generalized abdominal pain, greatest in the epigastrium but also reports persistent pain on left flank as well:  Will check CT scan of the abdomen and pelvis with contrast. *Abnormal stool pattern with what sounds like mild constipation:  Will begin Benefiber or Citrucel daily.  Drink plenty of fluids.   CC:  Vivi Barrack, MD

## 2019-06-18 NOTE — Patient Instructions (Signed)
You have been scheduled for a CT scan of the abdomen and pelvis at [facility] ([address]---this is in the same building as [facility]).   You are scheduled on 07/07/2019 at 3pm. You should arrive 15 minutes prior to your appointment time for registration. Please follow the written instructions below on the day of your exam:  WARNING: IF YOU ARE ALLERGIC TO IODINE/X-RAY DYE, PLEASE NOTIFY RADIOLOGY IMMEDIATELY AT [phone]! YOU WILL BE GIVEN A 13 HOUR PREMEDICATION PREP.  1) Do not eat or drink anything after 11am (4 hours prior to your test) 2) You have been given 2 bottles of oral contrast to drink. The solution may taste better if refrigerated, but do NOT add ice or any other liquid to this solution. Shake well before drinking.    Drink 1 bottle of contrast @ 1pm (2 hours prior to your exam)  Drink 1 bottle of contrast @ 2pm (1 hour prior to your exam)  You may take any medications as prescribed with a small amount of water, if necessary. If you take any of the following medications: METFORMIN, GLUCOPHAGE, GLUCOVANCE, AVANDAMET, RIOMET, FORTAMET, [place] MET, JANUMET, GLUMETZA or METAGLIP, you MAY be asked to HOLD this medication 48 hours AFTER the exam.  The purpose of you drinking the oral contrast is to aid in the visualization of your intestinal tract. The contrast solution may cause some diarrhea. Depending on your individual set of symptoms, you may also receive an intravenous injection of x-ray contrast/dye. Plan on being at [facility] for 30 minutes or longer, depending on the type of exam you are having performed.  This test typically takes 30-45 minutes to complete.  If you have any questions regarding your exam or if you need to reschedule, you may call the CT department at [phone] between the hours of 8:00 am and 5:00 pm, Monday-Friday.  Go to the basement for labs today   Use Benefiber or Citrucel  daily   ________________________________________________________________________

## 2019-06-19 ENCOUNTER — Encounter (HOSPITAL_COMMUNITY)
Admission: RE | Admit: 2019-06-19 | Discharge: 2019-06-19 | Disposition: A | Discharge status: Home / Self Care | Payer: Medicare Other | Source: Ambulatory Visit | Attending: Internal Medicine | Admitting: Internal Medicine

## 2019-06-19 ENCOUNTER — Other Ambulatory Visit: Payer: Self-pay

## 2019-06-19 DIAGNOSIS — I208 Other forms of angina pectoris: Secondary | ICD-10-CM

## 2019-06-19 DIAGNOSIS — Z79899 Other long term (current) drug therapy: Secondary | ICD-10-CM | POA: Diagnosis not present

## 2019-06-19 DIAGNOSIS — Z7984 Long term (current) use of oral hypoglycemic drugs: Secondary | ICD-10-CM | POA: Diagnosis not present

## 2019-06-19 DIAGNOSIS — Z79890 Hormone replacement therapy (postmenopausal): Secondary | ICD-10-CM | POA: Diagnosis not present

## 2019-06-19 DIAGNOSIS — Z7982 Long term (current) use of aspirin: Secondary | ICD-10-CM | POA: Diagnosis not present

## 2019-06-19 DIAGNOSIS — I10 Essential (primary) hypertension: Secondary | ICD-10-CM | POA: Diagnosis not present

## 2019-06-22 ENCOUNTER — Other Ambulatory Visit: Payer: Self-pay

## 2019-06-22 ENCOUNTER — Encounter (HOSPITAL_COMMUNITY)
Admission: RE | Admit: 2019-06-22 | Discharge: 2019-06-22 | Disposition: A | Discharge status: Home / Self Care | Payer: Medicare Other | Source: Ambulatory Visit | Attending: Internal Medicine | Admitting: Internal Medicine

## 2019-06-22 DIAGNOSIS — I208 Other forms of angina pectoris: Secondary | ICD-10-CM | POA: Diagnosis not present

## 2019-06-22 DIAGNOSIS — Z79890 Hormone replacement therapy (postmenopausal): Secondary | ICD-10-CM | POA: Diagnosis not present

## 2019-06-22 DIAGNOSIS — Z7984 Long term (current) use of oral hypoglycemic drugs: Secondary | ICD-10-CM | POA: Diagnosis not present

## 2019-06-22 DIAGNOSIS — Z79899 Other long term (current) drug therapy: Secondary | ICD-10-CM | POA: Diagnosis not present

## 2019-06-22 DIAGNOSIS — Z7982 Long term (current) use of aspirin: Secondary | ICD-10-CM | POA: Diagnosis not present

## 2019-06-22 DIAGNOSIS — I10 Essential (primary) hypertension: Secondary | ICD-10-CM | POA: Diagnosis not present

## 2019-06-24 ENCOUNTER — Encounter (HOSPITAL_COMMUNITY): Payer: Medicare Other

## 2019-06-24 ENCOUNTER — Other Ambulatory Visit: Payer: Self-pay

## 2019-06-24 ENCOUNTER — Encounter (HOSPITAL_COMMUNITY)
Admission: RE | Admit: 2019-06-24 | Discharge: 2019-06-24 | Disposition: A | Discharge status: Home / Self Care | Payer: Medicare Other | Source: Ambulatory Visit | Attending: Internal Medicine | Admitting: Internal Medicine

## 2019-06-24 DIAGNOSIS — Z7984 Long term (current) use of oral hypoglycemic drugs: Secondary | ICD-10-CM | POA: Diagnosis not present

## 2019-06-24 DIAGNOSIS — Z7982 Long term (current) use of aspirin: Secondary | ICD-10-CM | POA: Diagnosis not present

## 2019-06-24 DIAGNOSIS — Z79899 Other long term (current) drug therapy: Secondary | ICD-10-CM | POA: Diagnosis not present

## 2019-06-24 DIAGNOSIS — I208 Other forms of angina pectoris: Secondary | ICD-10-CM | POA: Diagnosis not present

## 2019-06-24 DIAGNOSIS — I10 Essential (primary) hypertension: Secondary | ICD-10-CM | POA: Diagnosis not present

## 2019-06-24 DIAGNOSIS — Z79890 Hormone replacement therapy (postmenopausal): Secondary | ICD-10-CM | POA: Diagnosis not present

## 2019-06-25 ENCOUNTER — Encounter: Payer: Self-pay | Admitting: Gastroenterology

## 2019-06-25 DIAGNOSIS — G603 Idiopathic progressive neuropathy: Secondary | ICD-10-CM | POA: Diagnosis not present

## 2019-06-25 DIAGNOSIS — G609 Hereditary and idiopathic neuropathy, unspecified: Secondary | ICD-10-CM | POA: Diagnosis not present

## 2019-06-25 DIAGNOSIS — M79672 Pain in left foot: Secondary | ICD-10-CM | POA: Diagnosis not present

## 2019-06-25 DIAGNOSIS — M5412 Radiculopathy, cervical region: Secondary | ICD-10-CM | POA: Diagnosis not present

## 2019-06-25 DIAGNOSIS — R634 Abnormal weight loss: Secondary | ICD-10-CM | POA: Diagnosis not present

## 2019-06-25 DIAGNOSIS — R198 Other specified symptoms and signs involving the digestive system and abdomen: Secondary | ICD-10-CM | POA: Insufficient documentation

## 2019-06-25 DIAGNOSIS — E559 Vitamin D deficiency, unspecified: Secondary | ICD-10-CM | POA: Diagnosis not present

## 2019-06-25 DIAGNOSIS — M5417 Radiculopathy, lumbosacral region: Secondary | ICD-10-CM | POA: Diagnosis not present

## 2019-06-25 DIAGNOSIS — R1013 Epigastric pain: Secondary | ICD-10-CM | POA: Insufficient documentation

## 2019-06-25 DIAGNOSIS — M79671 Pain in right foot: Secondary | ICD-10-CM | POA: Diagnosis not present

## 2019-06-25 DIAGNOSIS — G5602 Carpal tunnel syndrome, left upper limb: Secondary | ICD-10-CM | POA: Diagnosis not present

## 2019-06-25 DIAGNOSIS — R202 Paresthesia of skin: Secondary | ICD-10-CM | POA: Diagnosis not present

## 2019-06-25 DIAGNOSIS — Z79899 Other long term (current) drug therapy: Secondary | ICD-10-CM | POA: Diagnosis not present

## 2019-06-25 NOTE — Progress Notes (Signed)
Agree with assessment and plan as outlined.  

## 2019-06-26 ENCOUNTER — Other Ambulatory Visit: Payer: Self-pay

## 2019-06-26 ENCOUNTER — Telehealth: Payer: Self-pay | Admitting: Internal Medicine

## 2019-06-26 ENCOUNTER — Encounter (HOSPITAL_COMMUNITY)
Admission: RE | Admit: 2019-06-26 | Discharge: 2019-06-26 | Disposition: A | Discharge status: Home / Self Care | Payer: Medicare Other | Source: Ambulatory Visit | Attending: Internal Medicine | Admitting: Internal Medicine

## 2019-06-26 DIAGNOSIS — I208 Other forms of angina pectoris: Secondary | ICD-10-CM | POA: Diagnosis not present

## 2019-06-26 DIAGNOSIS — Z7984 Long term (current) use of oral hypoglycemic drugs: Secondary | ICD-10-CM | POA: Diagnosis not present

## 2019-06-26 DIAGNOSIS — Z79890 Hormone replacement therapy (postmenopausal): Secondary | ICD-10-CM | POA: Diagnosis not present

## 2019-06-26 DIAGNOSIS — I10 Essential (primary) hypertension: Secondary | ICD-10-CM | POA: Diagnosis not present

## 2019-06-26 DIAGNOSIS — Z79899 Other long term (current) drug therapy: Secondary | ICD-10-CM | POA: Diagnosis not present

## 2019-06-26 DIAGNOSIS — Z7982 Long term (current) use of aspirin: Secondary | ICD-10-CM | POA: Diagnosis not present

## 2019-06-26 MED ORDER — FUROSEMIDE 20 MG PO TABS: 20.0000 mg | ORAL_TABLET | Freq: Every day | ORAL | 3 refills | Status: DC

## 2019-06-26 NOTE — Progress Notes (Signed)
Pt presents to [facility] with complaints of lower extremity edema, SOB, and "saturating bandaids" after a nerve conduction study yesterday.  Upon assessment, pt has pitting edema in both lower extremities, L>R.  Weeping serous fluid noted at site for nerve conduction test.  Pt endorses SOB with talking. O2 saturation 99%.  Pt reports some mild SOB and epigastric pain during exercise.  This resolves with rest.  Findings reported to triage RN, Hilda Blades, at cardiology office.  Appointment scheduled for 07/08/2019 at 1200.  Pt instructed to monitor weight gain and edema and to report any increases to cardiology office.  Pt agreeable to plan and verbalized understanding.  Cardiologist, Dr. Margaretann Loveless, to be made aware per triage RN.

## 2019-06-26 NOTE — Telephone Encounter (Signed)
We can consider adding lasix 20 mg daily, and repeating a bmet the day she sees K. Kroeger in clinic.   If she would like to wait til her appt that's fine too.   Daily weights and blood pressures should be recorded and brought to appt.

## 2019-06-26 NOTE — Telephone Encounter (Signed)
Spoke with pt, aware of dr Delphina Cahill recommendations. She was encouraged to increase potassium intake while taking.

## 2019-06-26 NOTE — Telephone Encounter (Signed)
Spoke with cardiac rehab, she has gained 5.7 lb since last Friday. 2+ pitting edema in the left > right. She notices SOB with talking today and SOB while working on the equipment at rehab, considered mild. She had a nerve conduction test yesterday and has some weeping from the legs. After exercising  12 min she reports epigastric pain under the bra line and right wrist pain. The discomfort goes away with rest. Follow up scheduled with the pa 07/08/2019. She is scheduled for CT scan for the abdominal discomfort. Will make dr Margaretann Loveless aware.

## 2019-06-26 NOTE — Telephone Encounter (Signed)
New Message     Pt c/o swelling: STAT is pt has developed SOB within 24 hours  1) How much weight have you gained and in what time span? 2.6 kg (5.7 since Aug 7th   2) If swelling, where is the swelling located? Leg lower leg, and it is weeping  3) Are you currently taking a fluid pill? No  4) Are you currently SOB? Yes (saturation 99%)  5) Do you have a log of your daily weights (if so, list)? n/a  6) Have you gained 3 pounds in a day or 5 pounds in a week? n/a  7) Have you traveled recently? n/a    Vitals: 94/62 hr 88

## 2019-06-29 ENCOUNTER — Encounter (HOSPITAL_COMMUNITY)
Admission: RE | Admit: 2019-06-29 | Discharge: 2019-06-29 | Disposition: A | Discharge status: Home / Self Care | Payer: Medicare Other | Source: Ambulatory Visit | Attending: Internal Medicine | Admitting: Internal Medicine

## 2019-06-29 ENCOUNTER — Other Ambulatory Visit: Payer: Self-pay

## 2019-06-29 DIAGNOSIS — I10 Essential (primary) hypertension: Secondary | ICD-10-CM | POA: Diagnosis not present

## 2019-06-29 DIAGNOSIS — I208 Other forms of angina pectoris: Secondary | ICD-10-CM

## 2019-06-29 DIAGNOSIS — Z79890 Hormone replacement therapy (postmenopausal): Secondary | ICD-10-CM | POA: Diagnosis not present

## 2019-06-29 DIAGNOSIS — Z7982 Long term (current) use of aspirin: Secondary | ICD-10-CM | POA: Diagnosis not present

## 2019-06-29 DIAGNOSIS — Z79899 Other long term (current) drug therapy: Secondary | ICD-10-CM | POA: Diagnosis not present

## 2019-06-29 DIAGNOSIS — Z7984 Long term (current) use of oral hypoglycemic drugs: Secondary | ICD-10-CM | POA: Diagnosis not present

## 2019-06-30 DIAGNOSIS — Z803 Family history of malignant neoplasm of breast: Secondary | ICD-10-CM | POA: Diagnosis not present

## 2019-06-30 DIAGNOSIS — Z1231 Encounter for screening mammogram for malignant neoplasm of breast: Secondary | ICD-10-CM | POA: Diagnosis not present

## 2019-06-30 LAB — HM MAMMOGRAPHY

## 2019-07-01 ENCOUNTER — Encounter (HOSPITAL_COMMUNITY)
Admission: RE | Admit: 2019-07-01 | Discharge: 2019-07-01 | Disposition: A | Discharge status: Home / Self Care | Payer: Medicare Other | Source: Ambulatory Visit | Attending: Internal Medicine | Admitting: Internal Medicine

## 2019-07-01 ENCOUNTER — Other Ambulatory Visit: Payer: Self-pay

## 2019-07-01 DIAGNOSIS — I208 Other forms of angina pectoris: Secondary | ICD-10-CM

## 2019-07-01 DIAGNOSIS — Z7982 Long term (current) use of aspirin: Secondary | ICD-10-CM | POA: Diagnosis not present

## 2019-07-01 DIAGNOSIS — Z79890 Hormone replacement therapy (postmenopausal): Secondary | ICD-10-CM | POA: Diagnosis not present

## 2019-07-01 DIAGNOSIS — Z7984 Long term (current) use of oral hypoglycemic drugs: Secondary | ICD-10-CM | POA: Diagnosis not present

## 2019-07-01 DIAGNOSIS — Z79899 Other long term (current) drug therapy: Secondary | ICD-10-CM | POA: Diagnosis not present

## 2019-07-01 DIAGNOSIS — I10 Essential (primary) hypertension: Secondary | ICD-10-CM | POA: Diagnosis not present

## 2019-07-02 NOTE — Progress Notes (Signed)
Cardiac Individual Treatment Plan  Patient Details  Name: Savannah [name] MRN: [id number] Date of Birth: June 03, 1956 Referring Provider:     CARDIAC REHAB PHASE II ORIENTATION from 06/04/2019 in [facility]  Referring Provider  Elouise Munroe MD       Initial Encounter Date:    CARDIAC REHAB PHASE II ORIENTATION from 06/04/2019 in [place]  Date  06/04/19      Visit Diagnosis: Stable angina ([place])  Patient's Home Medications on Admission:  Current Outpatient Medications:  .  aspirin EC 81 MG tablet, Take 1 tablet (81 mg total) by mouth daily. Starting 01/18/19 prior to cardiac cath, Disp: , Rfl:  .  atorvastatin (LIPITOR) 40 MG tablet, Take 1 tablet (40 mg total) by mouth daily. (Patient taking differently: Take 40 mg by mouth at bedtime. ), Disp: 90 tablet, Rfl: 3 .  Biotin-D POWD, Take 1 tablet by mouth daily., Disp: , Rfl:  .  canagliflozin (INVOKANA) 100 MG TABS tablet, Take 1 tablet (100 mg total) by mouth daily before breakfast., Disp: 90 tablet, Rfl: 3 .  Cholecalciferol (VITAMIN D3) 125 MCG (5000 UT) CAPS, Take 5,000 Units by mouth daily., Disp: , Rfl:  .  Coenzyme Q10 (COQ-10) 400 MG CAPS, Take 400 mg by mouth every 3 (three) days. , Disp: , Rfl:  .  Colchicine (MITIGARE) 0.6 MG CAPS, Take 1 capsule by mouth daily., Disp: 90 capsule, Rfl: 1 .  diclofenac sodium (VOLTAREN) 1 % GEL, Apply 4 g topically 4 (four) times daily., Disp: 100 g, Rfl: 11 .  docusate sodium (COLACE) 100 MG capsule, Take 200 mg by mouth 2 (two) times daily., Disp: , Rfl:  .  furosemide (LASIX) 20 MG tablet, Take 1 tablet (20 mg total) by mouth daily., Disp: 90 tablet, Rfl: 3 .  gabapentin (NEURONTIN) 300 MG capsule, Take 2 capsules (600 mg total) by mouth 2 (two) times daily., Disp: 360 capsule, Rfl: 2 .  glucose blood test strip, 1 each by Other route daily. Use to monitor glucose levels once per day; E11.40, Disp: 100 each, Rfl: 2 .   halobetasol (ULTRAVATE) 0.05 % cream, Apply 1 application topically 2 (two) times daily as needed (lichen sclerosus). , Disp: , Rfl: 2 .  HYDROcodone-acetaminophen (NORCO/VICODIN) 5-325 MG tablet, Take 1 tablet by mouth daily as needed for moderate pain or severe pain., Disp: , Rfl:  .  hydrOXYzine (VISTARIL) 25 MG capsule, Take 25 mg by mouth at bedtime. Prn for itching, Disp: , Rfl:  .  levocetirizine (XYZAL ALLERGY 24HR) 5 MG tablet, Take 1 tablet by mouth daily., Disp: , Rfl:  .  levothyroxine (SYNTHROID, LEVOTHROID) 112 MCG tablet, Take 112 mcg by mouth daily before breakfast., Disp: , Rfl:  .  MAGNESIUM PO, Take 1 tablet by mouth daily as needed (cramps). , Disp: , Rfl:  .  metFORMIN (GLUCOPHAGE-XR) 500 MG 24 hr tablet, Take 2 tablets (1,000 mg total) by mouth daily. (Patient taking differently: Take 500 mg by mouth 2 (two) times a day. ), Disp: 180 tablet, Rfl: 3 .  Multiple Vitamin (MULTIVITAMIN) tablet, Take 1 tablet by mouth daily. , Disp: , Rfl:  .  nitroGLYCERIN (NITROSTAT) 0.4 MG SL tablet, Place 1 tablet (0.4 mg total) under the tongue every 5 (five) minutes as needed., Disp: 25 tablet, Rfl: 3 .  pantoprazole (PROTONIX) 40 MG tablet, TAKE 1 TABLET BY MOUTH EVERY DAY, Disp: 90 tablet, Rfl: 1 .  phenazopyridine (PYRIDIUM) 100  MG tablet, Take 100 mg by mouth 3 (three) times daily as needed for pain., Disp: , Rfl:  .  Probiotic CAPS, Take 1 capsule by mouth daily., Disp: , Rfl:  .  repaglinide (PRANDIN) 2 MG tablet, TAKE 1 TABLET BY MOUTH 3 TIMES A DAY BEFORE MEALS (Patient taking differently: 2 (two) times daily before a meal. Sometimes takes at night before bedtime), Disp: 270 tablet, Rfl: 1 .  topiramate (TOPAMAX) 50 MG tablet, Take 2 tablets (100 mg total) by mouth 2 (two) times daily., Disp: 120 tablet, Rfl: 3 .  traMADol (ULTRAM) 50 MG tablet, Take 50 mg by mouth 2 (two) times daily as needed for moderate pain., Disp: , Rfl:  .  TRULICITY 1.5 UV/2.5DG SOPN, INJECT 1.5 MG INTO THE SKIN  ONCE A WEEK. (Patient taking differently: Inject 1.5 mg as directed every Monday. ), Disp: 6 pen, Rfl: 11 .  TURMERIC CURCUMIN PO, Take 1 capsule by mouth daily., Disp: , Rfl:   Past Medical History: Past Medical History:  Diagnosis Date  . ABDOMINAL PAIN, CHRONIC 10/20/2009  . Acute cystitis 08/17/2009  . ALLERGIC RHINITIS 11/21/2007  . Allergy   . ASYMPTOMATIC POSTMENOPAUSAL STATUS 09/29/2008  . Bariatric surgery status 07/07/2010  . Bariatric surgery status 07/07/2010   Qualifier: Diagnosis of  By: Loanne Drilling MD, Jacelyn Pi   . Cancer ([place])    uterine  . Cataract   . Coronary artery disease   . DEPRESSION 11/21/2007  . DIABETES MELLITUS, TYPE II 07/08/2007  . DYSPNEA 05/20/2009  . Edema 05/20/2009  . Eosinophilic esophagitis 04/15/4033  . FEVER UNSPECIFIED 10/20/2009  . FEVER, HX OF 03/09/2010  . GERD (gastroesophageal reflux disease)   . Headache(784.0) 02/06/2010  . Hepatomegaly 01/06/2008  . HYPERLIPIDEMIA 07/08/2007  . HYPERTENSION 07/08/2007  . LEUKOPENIA, MILD 09/29/2008  . OBESITY 01/06/2008  . OSTEOARTHRITIS 01/06/2008  . Other chronic nonalcoholic liver disease 7/42/5956  . PERIPHERAL NEUROPATHY 11/21/2007  . Peripheral neuropathy   . Postsurgical hypothyroidism 09/19/2010  . SINUSITIS- ACUTE-NOS 11/21/2007  . TB SKIN TEST, POSITIVE 02/06/2010  . THYROID NODULE 03/09/2010    Tobacco Use: Social History   Tobacco Use  Smoking Status Former Smoker  . Packs/day: 1.50  . Years: 15.00  . Pack years: 22.50  . Types: Cigarettes  Smokeless Tobacco Never Used    Labs: Recent Review Flowsheet Data    Labs for ITP Cardiac and Pulmonary Rehab Latest Ref Rng & Units 12/27/2017 07/04/2018 01/09/2019 04/29/2019 05/14/2019   Cholestrol 100 - 199 mg/dL - 166 - 136 -   LDLCALC 0 - 99 mg/dL - 95 - 69 -   LDLDIRECT mg/dL - - - - -   HDL >39 mg/dL - 45.80 - 47 -   Trlycerides 0 - 149 mg/dL - 123.0 - 102 -   Hemoglobin A1c 4.0 - 5.6 % 6.5 6.3(A) 6.9(A) - 6.0(A)      Capillary Blood Glucose: Lab Results   Component Value Date   GLUCAP 143 (H) 06/10/2019   GLUCAP 155 (H) 06/10/2019   GLUCAP 118 (H) 06/08/2019   GLUCAP 75 06/08/2019   GLUCAP 117 (H) 06/08/2019     Exercise Target Goals: Exercise Program Goal: Individual exercise prescription set using results from initial 6 min walk test and THRR while considering  patient's activity barriers and safety.   Exercise Prescription Goal: Starting with aerobic activity 30 plus minutes a day, 3 days per week for initial exercise prescription. Provide home exercise prescription and guidelines that participant acknowledges understanding prior  to discharge.  Activity Barriers & Risk Stratification: Activity Barriers & Cardiac Risk Stratification - 06/04/19 1152      Activity Barriers & Cardiac Risk Stratification   Activity Barriers  Joint Problems;History of Falls;Balance Concerns    Cardiac Risk Stratification  High       6 Minute Walk: 6 Minute Walk    Row Name 06/04/19 1151         6 Minute Walk   Phase  Initial     Distance  1772 feet     Walk Time  6 minutes     # of Rest Breaks  0     MPH  3.4     METS  4.2     RPE  12     Perceived Dyspnea   0     VO2 Peak  14.72     Symptoms  No     Resting HR  67 bpm     Resting BP  104/62     Resting Oxygen Saturation   98 %     Exercise Oxygen Saturation  during 6 min walk  98 %     Max Ex. HR  102 bpm     Max Ex. BP  104/62     2 Minute Post BP  100/62        Oxygen Initial Assessment:   Oxygen Re-Evaluation:   Oxygen Discharge (Final Oxygen Re-Evaluation):   Initial Exercise Prescription: Initial Exercise Prescription - 06/04/19 1100      Date of Initial Exercise RX and Referring Provider   Date  06/04/19    Referring Provider  Elouise Munroe MD     Expected Discharge Date  07/17/19      NuStep   Level  1    SPM  75    Minutes  15    METs  2.5      Arm Ergometer   Level  1    Watts  25    Minutes  15    METs  2.5      Prescription Details    Frequency (times per week)  3x    Duration  Progress to 30 minutes of continuous aerobic without signs/symptoms of physical distress      Intensity   THRR 40-80% of Max Heartrate  62-124    Ratings of Perceived Exertion  11-13    Perceived Dyspnea  0-4      Progression   Progression  Continue progressive overload as per policy without signs/symptoms or physical distress.      Resistance Training   Training Prescription  Yes    Weight  1lb    Reps  10-15       Perform Capillary Blood Glucose checks as needed.  Exercise Prescription Changes: Exercise Prescription Changes    Row Name 06/08/19 1404 07/01/19 1445           Response to Exercise   Blood Pressure (Admit)  100/62  100/60      Blood Pressure (Exercise)  118/70  108/68      Blood Pressure (Exit)  100/70  102/60      Heart Rate (Admit)  79 bpm  88 bpm      Heart Rate (Exercise)  84 bpm  100 bpm      Heart Rate (Exit)  70 bpm  88 bpm      Rating of Perceived Exertion (Exercise)  12  13      Symptoms  none  none      Comments  Blood sugar dropped from 117 to 75 after exercise. Snack given, recheck 118  Reviewed METs and goals.       Duration  Continue with 30 min of aerobic exercise without signs/symptoms of physical distress.  Continue with 30 min of aerobic exercise without signs/symptoms of physical distress.      Intensity  THRR unchanged  THRR unchanged        Progression   Progression  Continue to progress workloads to maintain intensity without signs/symptoms of physical distress.  Continue to progress workloads to maintain intensity without signs/symptoms of physical distress.      Average METs  2  3.3        Resistance Training   Training Prescription  Yes  No      Weight  1lb  -      Reps  10-15  -      Time  10 Minutes  -        Interval Training   Interval Training  No  -        NuStep   Level  4  4      SPM  60  85      Minutes  15  15      METs  2  3.3        Arm Ergometer   Level  1  2.7       Minutes  15  15         Exercise Comments: Exercise Comments    Row Name 06/08/19 1445 07/02/19 1400         Exercise Comments  Patient tolerated low intensity exercise without c/o. Blood sugar dropped post exercised but recovered quickly with ginger ale and snack given.  Reviewed METs and goals with Pt. Pt is tolerating exercise Rx well.         Exercise Goals and Review: Exercise Goals    Row Name 06/04/19 1152             Exercise Goals   Increase Physical Activity  Yes       Intervention  Provide advice, education, support and counseling about physical activity/exercise needs.;Develop an individualized exercise prescription for aerobic and resistive training based on initial evaluation findings, risk stratification, comorbidities and participant's personal goals.       Expected Outcomes  Short Term: Attend rehab on a regular basis to increase amount of physical activity.;Long Term: Add in home exercise to make exercise part of routine and to increase amount of physical activity.;Long Term: Exercising regularly at least 3-5 days a week.       Increase Strength and Stamina  Yes       Intervention  Provide advice, education, support and counseling about physical activity/exercise needs.;Develop an individualized exercise prescription for aerobic and resistive training based on initial evaluation findings, risk stratification, comorbidities and participant's personal goals.       Expected Outcomes  Short Term: Increase workloads from initial exercise prescription for resistance, speed, and METs.;Short Term: Perform resistance training exercises routinely during rehab and add in resistance training at home;Long Term: Improve cardiorespiratory fitness, muscular endurance and strength as measured by increased METs and functional capacity (6MWT)       Able to understand and use rate of perceived exertion (RPE) scale  Yes       Intervention  Provide education and explanation on how to  use RPE scale  Expected Outcomes  Short Term: Able to use RPE daily in rehab to express subjective intensity level;Long Term:  Able to use RPE to guide intensity level when exercising independently       Knowledge and understanding of Target Heart Rate Range (THRR)  Yes       Intervention  Provide education and explanation of THRR including how the numbers were predicted and where they are located for reference       Expected Outcomes  Short Term: Able to state/look up THRR;Long Term: Able to use THRR to govern intensity when exercising independently;Short Term: Able to use daily as guideline for intensity in rehab       Able to check pulse independently  Yes       Intervention  Provide education and demonstration on how to check pulse in carotid and radial arteries.;Review the importance of being able to check your own pulse for safety during independent exercise       Expected Outcomes  Short Term: Able to explain why pulse checking is important during independent exercise;Long Term: Able to check pulse independently and accurately       Understanding of Exercise Prescription  Yes       Intervention  Provide education, explanation, and written materials on patient's individual exercise prescription       Expected Outcomes  Long Term: Able to explain home exercise prescription to exercise independently;Short Term: Able to explain program exercise prescription          Exercise Goals Re-Evaluation : Exercise Goals Re-Evaluation    [place] Name 06/08/19 1445 07/02/19 1358           Exercise Goal Re-Evaluation   Exercise Goals Review  Increase Physical Activity;Able to understand and use rate of perceived exertion (RPE) scale  Increase Physical Activity;Increase Strength and Stamina;Able to understand and use rate of perceived exertion (RPE) scale;Knowledge and understanding of Target Heart Rate Range (THRR);Able to check pulse independently;Understanding of Exercise Prescription      Comments   Patient able to understand and use RPE scale appropriately. Pt increased workload on recumbent stepper to level 4.0 without issue. Pt has less upper body strength, therefore workload on arm ergometer maintained at level 1.0.  Reviewed METs and goals with Pt. Pt is currently walking 60 minutes 5 days/week. Pt stated she has a bike she is going to start using but needs to be assembled. Pt is tolerating exercise Rx well and is increasing workloadds gradually.      Expected Outcomes  Increase workloads as tolerated to help improve strength and stamina.  Will continue ot monitor and progress Pt as tolerated.          Discharge Exercise Prescription (Final Exercise Prescription Changes): Exercise Prescription Changes - 07/01/19 1445      Response to Exercise   Blood Pressure (Admit)  100/60    Blood Pressure (Exercise)  108/68    Blood Pressure (Exit)  102/60    Heart Rate (Admit)  88 bpm    Heart Rate (Exercise)  100 bpm    Heart Rate (Exit)  88 bpm    Rating of Perceived Exertion (Exercise)  13    Symptoms  none    Comments  Reviewed METs and goals.     Duration  Continue with 30 min of aerobic exercise without signs/symptoms of physical distress.    Intensity  THRR unchanged      Progression   Progression  Continue to progress workloads to maintain intensity  without signs/symptoms of physical distress.    Average METs  3.3      Resistance Training   Training Prescription  No      NuStep   Level  4    SPM  85    Minutes  15    METs  3.3      Arm Ergometer   Level  2.7    Minutes  15       Nutrition:  Target Goals: Understanding of nutrition guidelines, daily intake of sodium 1500mg , cholesterol 200mg , calories 30% from fat and 7% or less from saturated fats, daily to have 5 or more servings of fruits and vegetables.  Biometrics: Pre Biometrics - 06/04/19 1153      Pre Biometrics   Height  5\' 11"  (1.803 m)    Weight  73 kg    Waist Circumference  34 inches    Hip  Circumference  40 inches    Waist to Hip Ratio  0.85 %    BMI (Calculated)  22.46    Triceps Skinfold  26 mm    % Body Fat  31.4 %    Grip Strength  22 kg    Flexibility  0 in    Single Leg Stand  2.81 seconds        Nutrition Therapy Plan and Nutrition Goals:   Nutrition Assessments:   Nutrition Goals Re-Evaluation:   Nutrition Goals Discharge (Final Nutrition Goals Re-Evaluation):   Psychosocial: Target Goals: Acknowledge presence or absence of significant depression and/or stress, maximize coping skills, provide positive support system. Participant is able to verbalize types and ability to use techniques and skills needed for reducing stress and depression.  Initial Review & Psychosocial Screening: Initial Psych Review & Screening - 06/04/19 1102      Initial Review   Current issues with  None Identified;History of Depression      Family Dynamics   Good Support System?  Yes   Savannah [name] lives alone and has her sister and friends that live nearby for support     Barriers   Psychosocial barriers to participate in program  There are no identifiable barriers or psychosocial needs.      Screening Interventions   Interventions  Encouraged to exercise       Quality of Life Scores: Quality of Life - 06/04/19 1148      Quality of Life   Select  Quality of Life      Quality of Life Scores   Health/Function Pre  22.27 %    Socioeconomic Pre  25.25 %    Psych/Spiritual Pre  22.07 %    Family Pre  28.75 %    GLOBAL Pre  23.32 %      Scores of 19 and below usually indicate a poorer quality of life in these areas.  A difference of  2-3 points is a clinically meaningful difference.  A difference of 2-3 points in the total score of the Quality of Life Index has been associated with significant improvement in overall quality of life, self-image, physical symptoms, and general health in studies assessing change in quality of life.  PHQ-9: Recent Review Flowsheet Data     Depression screen [facility] 2/9 06/04/2019 06/04/2019   Decreased Interest 0 0   Down, Depressed, Hopeless 0 0   PHQ - 2 Score 0 0     Interpretation of Total Score  Total Score Depression Severity:  1-4 = Minimal depression, 5-9 = Mild depression, 10-14 =  Moderate depression, 15-19 = Moderately severe depression, 20-27 = Severe depression   Psychosocial Evaluation and Intervention:   Psychosocial Re-Evaluation: Psychosocial Re-Evaluation    [place] Name 07/02/19 1612             Psychosocial Re-Evaluation   Current issues with  None Identified;History of Depression       Interventions  Encouraged to attend Cardiac Rehabilitation for the exercise       Continue Psychosocial Services   No Follow up required          Psychosocial Discharge (Final Psychosocial Re-Evaluation): Psychosocial Re-Evaluation - 07/02/19 1612      Psychosocial Re-Evaluation   Current issues with  None Identified;History of Depression    Interventions  Encouraged to attend Cardiac Rehabilitation for the exercise    Continue Psychosocial Services   No Follow up required       Vocational Rehabilitation: Provide vocational rehab assistance to qualifying candidates.   Vocational Rehab Evaluation & Intervention: Vocational Rehab - 06/04/19 1207      Initial Vocational Rehab Evaluation & Intervention   Assessment shows need for Vocational Rehabilitation  No       Education: Education Goals: Education classes will be provided on a weekly basis, covering required topics. Participant will state understanding/return demonstration of topics presented.  Learning Barriers/Preferences: Learning Barriers/Preferences - 06/04/19 1149      Learning Barriers/Preferences   Learning Barriers  None    Learning Preferences  Skilled Demonstration;Written Material;Verbal Instruction       Education Topics: Hypertension, Hypertension Reduction -Define heart disease and high blood pressure. Discus how high blood pressure  affects the body and ways to reduce high blood pressure.   Exercise and Your Heart -Discuss why it is important to exercise, the FITT principles of exercise, normal and abnormal responses to exercise, and how to exercise safely.   Angina -Discuss definition of angina, causes of angina, treatment of angina, and how to decrease risk of having angina.   Cardiac Medications -Review what the following cardiac medications are used for, how they affect the body, and side effects that may occur when taking the medications.  Medications include Aspirin, Beta blockers, calcium channel blockers, ACE Inhibitors, angiotensin receptor blockers, diuretics, digoxin, and antihyperlipidemics.   Congestive Heart Failure -Discuss the definition of CHF, how to live with CHF, the signs and symptoms of CHF, and how keep track of weight and sodium intake.   Heart Disease and Intimacy -Discus the effect sexual activity has on the heart, how changes occur during intimacy as we age, and safety during sexual activity.   Smoking Cessation / COPD -Discuss different methods to quit smoking, the health benefits of quitting smoking, and the definition of COPD.   Nutrition I: Fats -Discuss the types of cholesterol, what cholesterol does to the heart, and how cholesterol levels can be controlled.   Nutrition II: Labels -Discuss the different components of food labels and how to read food label   Heart Parts/Heart Disease and PAD -Discuss the anatomy of the heart, the pathway of blood circulation through the heart, and these are affected by heart disease.   Stress I: Signs and Symptoms -Discuss the causes of stress, how stress may lead to anxiety and depression, and ways to limit stress.   Stress II: Relaxation -Discuss different types of relaxation techniques to limit stress.   Warning Signs of Stroke / TIA -Discuss definition of a stroke, what the signs and symptoms are of a stroke, and how to identify  when someone is having stroke.   Knowledge Questionnaire Score: Knowledge Questionnaire Score - 06/04/19 1148      Knowledge Questionnaire Score   Pre Score  22/24       Core Components/Risk Factors/Patient Goals at Admission: Personal Goals and Risk Factors at Admission - 06/04/19 1207      Core Components/Risk Factors/Patient Goals on Admission    Weight Management  Weight Maintenance    Diabetes  Yes    Intervention  Provide education about signs/symptoms and action to take for hypo/hyperglycemia.;Provide education about proper nutrition, including hydration, and aerobic/resistive exercise prescription along with prescribed medications to achieve blood glucose in normal ranges: Fasting glucose 65-99 mg/dL    Expected Outcomes  Short Term: Participant verbalizes understanding of the signs/symptoms and immediate care of hyper/hypoglycemia, proper foot care and importance of medication, aerobic/resistive exercise and nutrition plan for blood glucose control.;Long Term: Attainment of HbA1C < 7%.    Hypertension  Yes    Intervention  Provide education on lifestyle modifcations including regular physical activity/exercise, weight management, moderate sodium restriction and increased consumption of fresh fruit, vegetables, and low fat dairy, alcohol moderation, and smoking cessation.;Monitor prescription use compliance.    Expected Outcomes  Short Term: Continued assessment and intervention until BP is < 140/5mm HG in hypertensive participants. < 130/66mm HG in hypertensive participants with diabetes, heart failure or chronic kidney disease.;Long Term: Maintenance of blood pressure at goal levels.    Lipids  Yes    Intervention  Provide education and support for participant on nutrition & aerobic/resistive exercise along with prescribed medications to achieve LDL 70mg , HDL >40mg .    Expected Outcomes  Short Term: Participant states understanding of desired cholesterol values and is compliant  with medications prescribed. Participant is following exercise prescription and nutrition guidelines.;Long Term: Cholesterol controlled with medications as prescribed, with individualized exercise RX and with personalized nutrition plan. Value goals: LDL < 70mg , HDL > 40 mg.       Core Components/Risk Factors/Patient Goals Review:  Goals and Risk Factor Review    Row Name 07/02/19 1613             Core Components/Risk Factors/Patient Goals Review   Personal Goals Review  Weight Management/Obesity;Lipids;Diabetes;Hypertension       Review  Savannah [name]'s vital signs and CBG's have been stable. Savannah [name] is doing well with exercise.       Expected Outcomes  Patient will continue to participte in phase 2 cardiac rehab for exercise, nutrition and lifestyle modifications.          Core Components/Risk Factors/Patient Goals at Discharge (Final Review):  Goals and Risk Factor Review - 07/02/19 1613      Core Components/Risk Factors/Patient Goals Review   Personal Goals Review  Weight Management/Obesity;Lipids;Diabetes;Hypertension    Review  Savannah [name]'s vital signs and CBG's have been stable. Savannah [name] is doing well with exercise.    Expected Outcomes  Patient will continue to participte in phase 2 cardiac rehab for exercise, nutrition and lifestyle modifications.       ITP Comments: ITP Comments    Row Name 06/04/19 1034 07/02/19 1610         ITP Comments  Dr. Fransico Him, Medical Director  30 Day ITP Review. Patient is with good participation and attendance in phase 2 cardiac rehab         Comments: 30 Day ITP Review. See ITP comments.Barnet Pall, RN,BSN 07/02/2019 4:18 PM

## 2019-07-03 ENCOUNTER — Other Ambulatory Visit: Payer: Self-pay

## 2019-07-03 ENCOUNTER — Encounter (HOSPITAL_COMMUNITY)
Admission: RE | Admit: 2019-07-03 | Discharge: 2019-07-03 | Disposition: A | Discharge status: Home / Self Care | Payer: Medicare Other | Source: Ambulatory Visit | Attending: Internal Medicine | Admitting: Internal Medicine

## 2019-07-03 DIAGNOSIS — Z7982 Long term (current) use of aspirin: Secondary | ICD-10-CM | POA: Diagnosis not present

## 2019-07-03 DIAGNOSIS — Z79899 Other long term (current) drug therapy: Secondary | ICD-10-CM | POA: Diagnosis not present

## 2019-07-03 DIAGNOSIS — I208 Other forms of angina pectoris: Secondary | ICD-10-CM | POA: Diagnosis not present

## 2019-07-03 DIAGNOSIS — Z79890 Hormone replacement therapy (postmenopausal): Secondary | ICD-10-CM | POA: Diagnosis not present

## 2019-07-03 DIAGNOSIS — Z7984 Long term (current) use of oral hypoglycemic drugs: Secondary | ICD-10-CM | POA: Diagnosis not present

## 2019-07-03 DIAGNOSIS — I10 Essential (primary) hypertension: Secondary | ICD-10-CM | POA: Diagnosis not present

## 2019-07-03 NOTE — Progress Notes (Signed)
Virtual Visit via Video Note   This visit type was conducted due to national recommendations for restrictions regarding the COVID-19 Pandemic (e.g. social distancing) in an effort to limit this patient's exposure and mitigate transmission in our community.  Due to her co-morbid illnesses, this patient is at least at moderate risk for complications without adequate follow up.  This format is felt to be most appropriate for this patient at this time.  All issues noted in this document were discussed and addressed.  A limited physical exam was performed with this format.  Please refer to the patient's chart for her consent to telehealth for [facility].   Date:  07/06/2019   ID:  Savannah [name], DOB Mar 31, 1956, MRN [id number]  Patient Location: Home Provider Location: Home  PCP:  Vivi Barrack, MD  Cardiologist:  Elouise Munroe, MD Electrophysiologist:  None   Evaluation Performed:  Follow-Up Visit  Chief Complaint:  Follow-up SOB and LE edema.  History of Present Illness:    Savannah [name] is a 63 y.o. female with PMH of CAD with chronic atypical chest pain, HLD, DM type 2, GERD, s/p gastric bypass,   Patient had a coronary CTA to further evaluate chest pain, revealing severe stenosis in the pRCA, ostial OM1, and dLAD. She underwent cardiac catheterization 01/2019 to further evaluate abnormal CTA which revealed significant 3-vessel CAD with most severe lesions involving relatively small vascular territories. She was recommended for medical management and consideration for orbital atherectomy and stenting of LAD/RCA if symptoms worsen. Stress echo 08/2018 showed EF 55-60%, severe focal basal hypertrophy, G1DD, no RWMA, no ischemia. She has been going to cardiac rehab for the past several months.   She was last seen by cardiology at an outpatient visit with Dr. Margaretann Loveless 04/21/2019 at which time she reported a recent episode of atypical chest pain but no other cardiac complaints. She was  recommended for GI work-up for possible GI etiology of chest pain (?esophageal spasms) and watchful waiting. She was referred to the lipid clinic for PSK9-I, though this was deferred as her LDL was at goal of <70 at 69 04/2019.   Patient presents today via a telemedicine visit with recent complaints of SOB, LEE, and weight gain, for which she called the office 06/26/2019 and was recommended to take lasix 20mg  daily + po K. She reported having a nerve conduction study around that time and experienced weeping from the needle site which persisted for several days. She also noted pitting edema to her knees bilaterally and SOB with activity and long conversations. Since starting lasix her symptoms have improved. She still has some SOB with activity though this is overall improved. She is almost back to her normal mile walk time of 20 min/mile (currenlty walking 21-22 min/miles). Her pitting edema has resolved and her weight is stable at 153lbs. No complaints of chest pain, orthopnea, PND, dizziness, lightheadedness, or syncope. She asks about peripheral vascular disease as she read that a difference in blood pressure reading from arms/legs can correspond with PVD, and reports her leg BP was significantly higher. She was using the same size cuff to measure both which I suspect explains the descrepancy. No complaints of claudication, rubor on dependency, cool LE's, or poorly healing wounds.   The patient does not have symptoms concerning for COVID-19 infection (fever, chills, cough, or new shortness of breath).     Past Medical History:  Diagnosis Date  . ABDOMINAL PAIN, CHRONIC 10/20/2009  . Acute cystitis 08/17/2009  .  ALLERGIC RHINITIS 11/21/2007  . Allergy   . ASYMPTOMATIC POSTMENOPAUSAL STATUS 09/29/2008  . Bariatric surgery status 07/07/2010  . Bariatric surgery status 07/07/2010   Qualifier: Diagnosis of  By: Loanne Drilling MD, Jacelyn Pi   . Cancer ([place])    uterine  . Cataract   . Coronary artery disease   .  DEPRESSION 11/21/2007  . DIABETES MELLITUS, TYPE II 07/08/2007  . DYSPNEA 05/20/2009  . Edema 05/20/2009  . Eosinophilic esophagitis 0000000  . FEVER UNSPECIFIED 10/20/2009  . FEVER, HX OF 03/09/2010  . GERD (gastroesophageal reflux disease)   . Headache(784.0) 02/06/2010  . Hepatomegaly 01/06/2008  . HYPERLIPIDEMIA 07/08/2007  . HYPERTENSION 07/08/2007  . LEUKOPENIA, MILD 09/29/2008  . OBESITY 01/06/2008  . OSTEOARTHRITIS 01/06/2008  . Other chronic nonalcoholic liver disease [id number]  . PERIPHERAL NEUROPATHY 11/21/2007  . Peripheral neuropathy   . Postsurgical hypothyroidism 09/19/2010  . SINUSITIS- ACUTE-NOS 11/21/2007  . TB SKIN TEST, POSITIVE 02/06/2010  . THYROID NODULE 03/09/2010   Past Surgical History:  Procedure Laterality Date  . ABDOMINAL HYSTERECTOMY    . BASAL CELL CARCINOMA EXCISION    . CARDIAC CATHETERIZATION    . COLONOSCOPY    . EYE SURGERY Bilateral 08/2018, 09/2018  . KNEE ARTHROSCOPY Left   . LEFT HEART CATH AND CORONARY ANGIOGRAPHY N/A 01/20/2019   Procedure: LEFT HEART CATH AND CORONARY ANGIOGRAPHY;  Surgeon: Belva Crome, MD;  Location: [facility];  Service: Cardiovascular;  Laterality: N/A;  . LYMPH NODE DISSECTION    . OOPHORECTOMY    . Right total knee replacement    . ROUX-EN-Y GASTRIC BYPASS  2011  . SKIN TAG REMOVAL    . THYROIDECTOMY    . TONSILLECTOMY AND ADENOIDECTOMY    . UPPER GASTROINTESTINAL ENDOSCOPY       Current Meds  Medication Sig  . aspirin EC 81 MG tablet Take 1 tablet (81 mg total) by mouth daily. Starting 01/18/19 prior to cardiac cath  . atorvastatin (LIPITOR) 40 MG tablet Take 1 tablet (40 mg total) by mouth daily. (Patient taking differently: Take 40 mg by mouth at bedtime. )  . Biotin-D POWD Take 1 tablet by mouth daily.  . canagliflozin (INVOKANA) 100 MG TABS tablet Take 1 tablet (100 mg total) by mouth daily before breakfast.  . Cholecalciferol (VITAMIN D3) 125 MCG (5000 UT) CAPS Take 5,000 Units by mouth daily.  . Colchicine  (MITIGARE) 0.6 MG CAPS Take 1 capsule by mouth daily.  . diclofenac sodium (VOLTAREN) 1 % GEL Apply 4 g topically 4 (four) times daily.  [name] docusate sodium (COLACE) 100 MG capsule Take 200 mg by mouth 2 (two) times daily.  . furosemide (LASIX) 20 MG tablet Take 1 tablet (20 mg total) by mouth daily.  [name] gabapentin (NEURONTIN) 300 MG capsule Take 2 capsules (600 mg total) by mouth 2 (two) times daily.  [name] glucose blood test strip 1 each by Other route daily. Use to monitor glucose levels once per day; E11.40  . halobetasol (ULTRAVATE) 0.05 % cream Apply 1 application topically 2 (two) times daily as needed (lichen sclerosus).   [name] HYDROcodone-acetaminophen (NORCO/VICODIN) 5-325 MG tablet Take 1 tablet by mouth daily as needed for moderate pain or severe pain.  . hydrOXYzine (VISTARIL) 25 MG capsule Take 25 mg by mouth at bedtime. Prn for itching  . levocetirizine (XYZAL ALLERGY 24HR) 5 MG tablet Take 1 tablet by mouth daily.  [name] levothyroxine (SYNTHROID, LEVOTHROID) 112 MCG tablet Take 112 mcg by mouth daily before breakfast.  .  MAGNESIUM PO Take 1 tablet by mouth daily as needed (cramps).   . metFORMIN (GLUCOPHAGE-XR) 500 MG 24 hr tablet Take 2 tablets (1,000 mg total) by mouth daily. (Patient taking differently: Take 500 mg by mouth 2 (two) times a day. )  . Multiple Vitamin (MULTIVITAMIN) tablet Take 1 tablet by mouth daily.   . nitroGLYCERIN (NITROSTAT) 0.4 MG SL tablet Place 1 tablet (0.4 mg total) under the tongue every 5 (five) minutes as needed.  . pantoprazole (PROTONIX) 40 MG tablet TAKE 1 TABLET BY MOUTH EVERY DAY  . phenazopyridine (PYRIDIUM) 100 MG tablet Take 100 mg by mouth 3 (three) times daily as needed for pain.  . Probiotic CAPS Take 1 capsule by mouth daily.  . repaglinide (PRANDIN) 2 MG tablet Take 2 mg by mouth 2 (two) times daily before a meal.  . topiramate (TOPAMAX) 50 MG tablet Take 2 tablets (100 mg total) by mouth 2 (two) times daily.  . traMADol (ULTRAM) 50 MG tablet Take  50 mg by mouth 2 (two) times daily as needed for moderate pain.  . TRULICITY 1.5 0000000 SOPN INJECT 1.5 MG INTO THE SKIN ONCE A WEEK. (Patient taking differently: Inject 1.5 mg as directed every Monday. )  . TURMERIC CURCUMIN PO Take 1 capsule by mouth daily.  . [DISCONTINUED] albuterol (VENTOLIN HFA) 108 (90 Base) MCG/ACT inhaler Inhale 2 puffs into the lungs every 6 (six) hours as needed for wheezing or shortness of breath.     Allergies:   Ace inhibitors, Caffeine, Ciprofloxacin, Morphine and related, Mucinex [guaifenesin er], Nsaids, and Other   Social History   Tobacco Use  . Smoking status: Former Smoker    Packs/day: 1.50    Years: 15.00    Pack years: 22.50    Types: Cigarettes  . Smokeless tobacco: Never Used  Substance Use Topics  . Alcohol use: Yes    Alcohol/week: 0.0 standard drinks    Comment: rarely  . Drug use: Never     Family Hx: The patient's family history includes Breast cancer in her maternal aunt and sister; Colon cancer in her maternal uncle; Congestive Heart Failure in her father; Diabetes in her father, maternal aunt, maternal grandmother, maternal uncle, mother, and paternal grandmother; Hypertension in her father; Liver cancer in her maternal aunt; Multiple sclerosis in her sister; Myelodysplastic syndrome in her father; Osteoporosis in her sister.  ROS:   Please see the history of present illness.     All other systems reviewed and are negative.   Prior CV studies:   The following studies were reviewed today:  Echocardiogram 08/2018: Study Conclusions  - Left ventricle: There was severe focal basal hypertrophy. Systolic function was normal. The estimated ejection fraction was in the range of 55% to 60%. Wall motion was normal; there were no regional wall motion abnormalities. There was an increased relative contribution of atrial contraction to ventricular filling. Doppler parameters are consistent with abnormal left ventricular relaxation (grade  1 diastolic dysfunction). - Aortic valve: Mildly calcified leaflets. - Mitral valve: Mild focal calcification of the anterior leaflet. There was mild regurgitation. - Stress ECG conclusions: There were no stress arrhythmias or conduction abnormalities. The stress ECG was negative for ischemia. - Peak stress: Overall LV contractility appears to increase with exercise but images are poor quality. Definity contrast was used but cannot rule out with certainty any stress induced wall motion abnormality. - Impressions: Sensitivity of this study was limited due to suboptimal image quality.  Impressions:  - Sensitivity of this  study was limited due to suboptimal image quality.  Recommendations:  Consider coronary CTA or stress myoview.  Coronary CTA 10/2018: IMPRESSION: 1. CT FFR analysis showed severe stenoses in the proximal RCA, ostial portion of OM1 and distal LAD (might be small lumen for intervention). Cardiac catheterization is recommended.  Left heart catheterization 01/2019:  Diffuse LAD and right coronary calcification.  Widely patent left main  Eccentric 70% proximal LAD within a calcified segment.  Distal to apical LAD is calcified and contains greater than 90% diffuse obstruction.  Circumflex coronary artery is dominant.  The second obtuse marginal which is moderate in size contains 85% stenosis followed by 80% stenosis within a tortuous mid segment.  The circumflex is otherwise widely patent.  Right coronary is nondominant and contains proximal to mid heavy calcification with 70 to 80% stenosis.  Left ventricular function is normal.  LVEDP is normal.  RECOMMENDATIONS:   The patient has significant three-vessel coronary disease with most severe lesions involving relatively small vascular territories.  The right coronary is essentially nondominant and contains an 80% proximal stenosis that could be treated with orbital atherectomy (downstream territory is small).  The  second obtuse marginal contains mid vessel significant stenosis with and tortuosity with only a small territory of potential ischemia beyond the lesions and is best treated with medical therapy.  The distal LAD is heavily calcified and diffusely involved, also best treated with medical therapy.  The proximal LAD is eccentric and is borderline significant angiographically and could be treated with orbital atherectomy and stenting if symptoms become uncontrollable.  Aggressive secondary risk prevention including LDL of 55 or less, consider adding PCSK9 therapy.  Aggressive anti-ischemic therapy  Labs/Other Tests and Data Reviewed:    EKG:  No ECG reviewed.  Recent Labs: 01/09/2019: Hemoglobin 13.8; Platelets 180; TSH 0.241 06/18/2019: BUN 15; Creatinine, Ser 0.99; Potassium 3.9; Sodium 143   Recent Lipid Panel Lab Results  Component Value Date/Time   CHOL 136 04/29/2019 01:36 PM   TRIG 102 04/29/2019 01:36 PM   HDL 47 04/29/2019 01:36 PM   CHOLHDL 2.9 04/29/2019 01:36 PM   CHOLHDL 4 07/04/2018 02:03 PM   LDLCALC 69 04/29/2019 01:36 PM   LDLDIRECT 99.0 10/12/2011 12:19 PM    Wt Readings from Last 3 Encounters:  07/06/19 153 lb 3.2 oz (69.5 kg)  06/18/19 157 lb 2 oz (71.3 kg)  06/04/19 160 lb 15 oz (73 kg)     Objective:    Vital Signs:  BP 128/75   Pulse 70   Wt 153 lb 3.2 oz (69.5 kg)   BMI 21.67 kg/m    VITAL SIGNS:  reviewed GEN:  no acute distress EYES:  sclerae anicteric, EOMI - Extraocular Movements Intact RESPIRATORY:  normal respiratory effort, symmetric expansion CARDIOVASCULAR:  no peripheral edema SKIN:  no rash, lesions or ulcers. MUSCULOSKELETAL:  no obvious deformities. NEURO:  alert and oriented x 3, no obvious focal deficit PSYCH:  normal affect  ASSESSMENT & PLAN:    1. Chronic diastolic CHF: recent complaints of SOB, LE edema, and weight gain sound consistent with acute on chronic diastolic CHF. Started on lasix 20mg  daily with improvement in symptoms,  near resolution with mild lingering DOE.  - Will check a BNP and BMET today to evaluate for ongoing volume overload, and monitor electrolytes/kidney function. - Favor continuing lasix 20mg  daily at this time.  - Blood pressure is up from baseline today - patient instructed to continue monitoring over the next week or so and notify  the office if SBP remains in the 110s-120s range as we may be able to restart low dose metoprolol   2. Multivessel CAD: medically managed. No recent complaints of chest pain. Continues to work with cardiac rehab - Continue aspirin and statin - Blood pressure is up from baseline today - patient instructed to continue monitoring over the next week or so and notify the office if SBP remains in the 110s-120s range as we may be able to restart low dose metoprolol   3. HLD: LDL improved to 69 on labs 04/2019 - Continue atrovastatin  4. DM type 2: Hgb A1C well controlled at 6.0 05/2019 - Continue invokana and metformin per PCP   Time:   Today, I have spent 22 minutes with the patient with telehealth technology discussing the above problems.     Medication Adjustments/Labs and Tests Ordered: Current medicines are reviewed at length with the patient today.  Concerns regarding medicines are outlined above.   Tests Ordered: Orders Placed This Encounter  Procedures  . Basic metabolic panel  . Brain natriuretic peptide    Medication Changes: Meds ordered this encounter  Medications  . DISCONTD: amLODipine (NORVASC) 5 MG tablet    Sig: Take 1 tablet (5 mg total) by mouth daily.    Dispense:  30 tablet    Refill:  6  . DISCONTD: albuterol (VENTOLIN HFA) 108 (90 Base) MCG/ACT inhaler    Sig: Inhale 2 puffs into the lungs every 6 (six) hours as needed for wheezing or shortness of breath.    Dispense:  6.7 g    Refill:  0    Follow Up:  Virtual Visit or In Person in 3 month(s)  Signed, Abigail Butts, PA-C  07/06/2019 12:48 PM    [place]

## 2019-07-06 ENCOUNTER — Telehealth (INDEPENDENT_AMBULATORY_CARE_PROVIDER_SITE_OTHER): Payer: Medicare Other | Admitting: Medical

## 2019-07-06 ENCOUNTER — Other Ambulatory Visit: Payer: Self-pay

## 2019-07-06 ENCOUNTER — Encounter (HOSPITAL_COMMUNITY)
Admission: RE | Admit: 2019-07-06 | Discharge: 2019-07-06 | Disposition: A | Discharge status: Home / Self Care | Payer: Medicare Other | Source: Ambulatory Visit | Attending: Internal Medicine | Admitting: Internal Medicine

## 2019-07-06 ENCOUNTER — Encounter: Payer: Self-pay | Admitting: Medical

## 2019-07-06 VITALS — BP 128/75 | HR 70 | Wt 153.2 lb

## 2019-07-06 DIAGNOSIS — E1169 Type 2 diabetes mellitus with other specified complication: Secondary | ICD-10-CM | POA: Diagnosis not present

## 2019-07-06 DIAGNOSIS — I10 Essential (primary) hypertension: Secondary | ICD-10-CM | POA: Diagnosis not present

## 2019-07-06 DIAGNOSIS — Z79899 Other long term (current) drug therapy: Secondary | ICD-10-CM | POA: Diagnosis not present

## 2019-07-06 DIAGNOSIS — Z7984 Long term (current) use of oral hypoglycemic drugs: Secondary | ICD-10-CM | POA: Diagnosis not present

## 2019-07-06 DIAGNOSIS — Z7982 Long term (current) use of aspirin: Secondary | ICD-10-CM | POA: Diagnosis not present

## 2019-07-06 DIAGNOSIS — E785 Hyperlipidemia, unspecified: Secondary | ICD-10-CM | POA: Diagnosis not present

## 2019-07-06 DIAGNOSIS — I5032 Chronic diastolic (congestive) heart failure: Secondary | ICD-10-CM | POA: Diagnosis not present

## 2019-07-06 DIAGNOSIS — E1140 Type 2 diabetes mellitus with diabetic neuropathy, unspecified: Secondary | ICD-10-CM

## 2019-07-06 DIAGNOSIS — Z79890 Hormone replacement therapy (postmenopausal): Secondary | ICD-10-CM | POA: Diagnosis not present

## 2019-07-06 DIAGNOSIS — I2510 Atherosclerotic heart disease of native coronary artery without angina pectoris: Secondary | ICD-10-CM | POA: Diagnosis not present

## 2019-07-06 DIAGNOSIS — I208 Other forms of angina pectoris: Secondary | ICD-10-CM

## 2019-07-06 MED ORDER — ALBUTEROL SULFATE HFA 108 (90 BASE) MCG/ACT IN AERS: 2.0000 | INHALATION_SPRAY | Freq: Four times a day (QID) | RESPIRATORY_TRACT | 0 refills | Status: DC | PRN

## 2019-07-06 MED ORDER — AMLODIPINE BESYLATE 5 MG PO TABS: 5.0000 mg | ORAL_TABLET | Freq: Every day | ORAL | 6 refills | Status: DC

## 2019-07-06 NOTE — Patient Instructions (Addendum)
Medication Instructions:  Your physician recommends that you continue on your current medications as directed. Please refer to the Current Medication list given to you today.  If you need a refill on your cardiac medications before your next appointment, please call your pharmacy.   Lab work: Your physician recommends that you return for lab work today: BMET, BNP at our [place] office. No appointment is needed.  If you have labs (blood work) drawn today and your tests are completely normal, you will receive your results only by: [name] MyChart Message (if you have MyChart) OR . A paper copy in the mail If you have any lab test that is abnormal or we need to change your treatment, we will call you to review the results.  Follow-Up: At [facility], you and your health needs are our priority.  As part of our continuing mission to provide you with exceptional heart care, we have created designated Provider Care Teams.  These Care Teams include your primary Cardiologist (physician) and Advanced Practice Providers (APPs -  Physician Assistants and Nurse Practitioners) who all work together to provide you with the care you need, when you need it. You will need a follow up appointment in 3 months.  Please call our office in advance to schedule this appointment.  You may see Elouise Munroe, MD or one of the following Advanced Practice Providers on your designated Care Team:   Rosaria Ferries, PA-C . Jory Sims, DNP, ANP  Any Other Special Instructions Will Be Listed Below (If Applicable). Your physician has requested that you continue to regularly monitor and record your blood pressure readings at home. Please use the same machine at the same time of day to check your readings and record them to bring to your follow-up visit. If your systolic blood pressure (top number) is in the 110-120 range, please call our office.

## 2019-07-06 NOTE — Progress Notes (Signed)
Reviewed home exercise guidelines with patient including endpoints, temperature precautions, target heart rate and rate of perceived exertion. Pt is walking 60 minutes daily as her mode of home exercise. Pt voices understanding of instructions given.  Sol Passer, MS, ACSM CEP

## 2019-07-07 ENCOUNTER — Other Ambulatory Visit: Payer: Medicare Other

## 2019-07-07 DIAGNOSIS — Z01419 Encounter for gynecological examination (general) (routine) without abnormal findings: Secondary | ICD-10-CM | POA: Diagnosis not present

## 2019-07-07 DIAGNOSIS — D4959 Neoplasm of unspecified behavior of other genitourinary organ: Secondary | ICD-10-CM | POA: Diagnosis not present

## 2019-07-07 DIAGNOSIS — L900 Lichen sclerosus et atrophicus: Secondary | ICD-10-CM | POA: Diagnosis not present

## 2019-07-07 LAB — BASIC METABOLIC PANEL
BUN/Creatinine Ratio: 21 (ref 12–28)
BUN: 22 mg/dL (ref 8–27)
CO2: 22 mmol/L (ref 20–29)
Calcium: 9.1 mg/dL (ref 8.7–10.3)
Chloride: 106 mmol/L (ref 96–106)
Creatinine, Ser: 1.07 mg/dL — ABNORMAL HIGH (ref 0.57–1.00)
GFR calc Af Amer: 63 mL/min/{1.73_m2} (ref >59)
GFR calc non Af Amer: 55 mL/min/{1.73_m2} — ABNORMAL LOW (ref >59)
Glucose: 90 mg/dL (ref 65–99)
Potassium: 4.2 mmol/L (ref 3.5–5.2)
Sodium: 144 mmol/L (ref 134–144)

## 2019-07-07 LAB — BRAIN NATRIURETIC PEPTIDE: BNP: 69 pg/mL (ref 0.0–100.0)

## 2019-07-08 ENCOUNTER — Other Ambulatory Visit: Payer: Self-pay

## 2019-07-08 ENCOUNTER — Encounter (HOSPITAL_COMMUNITY)
Admission: RE | Admit: 2019-07-08 | Discharge: 2019-07-08 | Disposition: A | Discharge status: Home / Self Care | Payer: Medicare Other | Source: Ambulatory Visit | Attending: Internal Medicine | Admitting: Internal Medicine

## 2019-07-08 ENCOUNTER — Ambulatory Visit: Payer: Medicare Other | Admitting: Medical

## 2019-07-08 DIAGNOSIS — I208 Other forms of angina pectoris: Secondary | ICD-10-CM

## 2019-07-09 ENCOUNTER — Ambulatory Visit (INDEPENDENT_AMBULATORY_CARE_PROVIDER_SITE_OTHER)
Admission: RE | Admit: 2019-07-09 | Discharge: 2019-07-09 | Disposition: A | Discharge status: Home / Self Care | Payer: Medicare Other | Source: Ambulatory Visit | Attending: Gastroenterology | Admitting: Gastroenterology

## 2019-07-09 DIAGNOSIS — R1013 Epigastric pain: Secondary | ICD-10-CM | POA: Diagnosis not present

## 2019-07-09 DIAGNOSIS — R1084 Generalized abdominal pain: Secondary | ICD-10-CM

## 2019-07-09 DIAGNOSIS — R197 Diarrhea, unspecified: Secondary | ICD-10-CM | POA: Diagnosis not present

## 2019-07-09 DIAGNOSIS — K5641 Fecal impaction: Secondary | ICD-10-CM | POA: Diagnosis not present

## 2019-07-09 MED ORDER — IOHEXOL 300 MG/ML  SOLN
100.0000 mL | Freq: Once | INTRAMUSCULAR | Status: AC | PRN
  Administered 2019-07-09: 100 mL via INTRAVENOUS

## 2019-07-10 ENCOUNTER — Encounter (HOSPITAL_COMMUNITY)
Admission: RE | Admit: 2019-07-10 | Discharge: 2019-07-10 | Disposition: A | Discharge status: Home / Self Care | Payer: Medicare Other | Source: Ambulatory Visit | Attending: Internal Medicine | Admitting: Internal Medicine

## 2019-07-10 ENCOUNTER — Other Ambulatory Visit: Payer: Self-pay

## 2019-07-10 DIAGNOSIS — I208 Other forms of angina pectoris: Secondary | ICD-10-CM

## 2019-07-10 DIAGNOSIS — Z7984 Long term (current) use of oral hypoglycemic drugs: Secondary | ICD-10-CM | POA: Diagnosis not present

## 2019-07-10 DIAGNOSIS — Z79890 Hormone replacement therapy (postmenopausal): Secondary | ICD-10-CM | POA: Diagnosis not present

## 2019-07-10 DIAGNOSIS — Z79899 Other long term (current) drug therapy: Secondary | ICD-10-CM | POA: Diagnosis not present

## 2019-07-10 DIAGNOSIS — I10 Essential (primary) hypertension: Secondary | ICD-10-CM | POA: Diagnosis not present

## 2019-07-10 DIAGNOSIS — Z7982 Long term (current) use of aspirin: Secondary | ICD-10-CM | POA: Diagnosis not present

## 2019-07-13 ENCOUNTER — Other Ambulatory Visit: Payer: Self-pay

## 2019-07-13 ENCOUNTER — Ambulatory Visit (INDEPENDENT_AMBULATORY_CARE_PROVIDER_SITE_OTHER): Payer: Medicare Other | Admitting: Endocrinology

## 2019-07-13 ENCOUNTER — Encounter: Payer: Self-pay | Admitting: Endocrinology

## 2019-07-13 ENCOUNTER — Encounter (HOSPITAL_COMMUNITY)
Admission: RE | Admit: 2019-07-13 | Discharge: 2019-07-13 | Disposition: A | Discharge status: Home / Self Care | Payer: Medicare Other | Source: Ambulatory Visit | Attending: Internal Medicine | Admitting: Internal Medicine

## 2019-07-13 VITALS — Ht 71.0 in | Wt 158.5 lb

## 2019-07-13 VITALS — BP 116/70 | HR 68 | Ht 70.5 in | Wt 159.4 lb

## 2019-07-13 DIAGNOSIS — E1159 Type 2 diabetes mellitus with other circulatory complications: Secondary | ICD-10-CM

## 2019-07-13 DIAGNOSIS — Z23 Encounter for immunization: Secondary | ICD-10-CM | POA: Diagnosis not present

## 2019-07-13 DIAGNOSIS — Z79899 Other long term (current) drug therapy: Secondary | ICD-10-CM | POA: Diagnosis not present

## 2019-07-13 DIAGNOSIS — I10 Essential (primary) hypertension: Secondary | ICD-10-CM | POA: Diagnosis not present

## 2019-07-13 DIAGNOSIS — I208 Other forms of angina pectoris: Secondary | ICD-10-CM | POA: Diagnosis not present

## 2019-07-13 DIAGNOSIS — E1140 Type 2 diabetes mellitus with diabetic neuropathy, unspecified: Secondary | ICD-10-CM | POA: Diagnosis not present

## 2019-07-13 DIAGNOSIS — R609 Edema, unspecified: Secondary | ICD-10-CM

## 2019-07-13 DIAGNOSIS — Z7982 Long term (current) use of aspirin: Secondary | ICD-10-CM | POA: Diagnosis not present

## 2019-07-13 DIAGNOSIS — Z79890 Hormone replacement therapy (postmenopausal): Secondary | ICD-10-CM | POA: Diagnosis not present

## 2019-07-13 DIAGNOSIS — Z7984 Long term (current) use of oral hypoglycemic drugs: Secondary | ICD-10-CM | POA: Diagnosis not present

## 2019-07-13 LAB — POCT GLYCOSYLATED HEMOGLOBIN (HGB A1C): Hemoglobin A1C: 5.9 % — AB (ref 4.0–5.6)

## 2019-07-13 NOTE — Patient Instructions (Addendum)
Please continue the same medications check your blood sugar once a day.  vary the time of day when you check, between before the 3 meals, and at bedtime.  also check if you have symptoms of your blood sugar being too high or too low.  please keep a record of the readings and bring it to your next appointment here (or you can bring the meter itself).  You can write it on any piece of paper.  please call us sooner if your blood sugar goes below 70, or if you have a lot of readings over 200. Please come back for a follow-up appointment in 6 months.   

## 2019-07-13 NOTE — Progress Notes (Signed)
Subjective:    Patient ID: Savannah [name], female    DOB: 22-Jun-1956, 63 y.o.   MRN: [id number]  HPI Pt returns for f/u of diabetes mellitus:  DM type: 2 Dx'ed: [id number] Complications: painful polyneuropathy, and CAD (seen on CT).   Therapy: trulicity and 3 oral meds.   GDM: never DKA: never Severe hypoglycemia: never.   Pancreatitis: never.  Other: she took insulin from 2004-2011, when she had gastric bypass surgery.   Interval history: She says cbg's vary from 50-200's.  It is in general lowest in the middle of the night, after she takes repaglinide at HS.  invokana dosage is limited by hypotension.   Past Medical History:  Diagnosis Date  . ABDOMINAL PAIN, CHRONIC 10/20/2009  . Acute cystitis 08/17/2009  . ALLERGIC RHINITIS 11/21/2007  . Allergy   . ASYMPTOMATIC POSTMENOPAUSAL STATUS 09/29/2008  . Bariatric surgery status 07/07/2010  . Bariatric surgery status 07/07/2010   Qualifier: Diagnosis of  By: Loanne Drilling MD, Jacelyn Pi   . Cancer ([place])    uterine  . Cataract   . Coronary artery disease   . DEPRESSION 11/21/2007  . DIABETES MELLITUS, TYPE II 07/08/2007  . DYSPNEA 05/20/2009  . Edema 05/20/2009  . Eosinophilic esophagitis 0000000  . FEVER UNSPECIFIED 10/20/2009  . FEVER, HX OF 03/09/2010  . GERD (gastroesophageal reflux disease)   . Headache(784.0) 02/06/2010  . Hepatomegaly 01/06/2008  . HYPERLIPIDEMIA 07/08/2007  . HYPERTENSION 07/08/2007  . LEUKOPENIA, MILD 09/29/2008  . OBESITY 01/06/2008  . OSTEOARTHRITIS 01/06/2008  . Other chronic nonalcoholic liver disease [id number]  . PERIPHERAL NEUROPATHY 11/21/2007  . Peripheral neuropathy   . Postsurgical hypothyroidism 09/19/2010  . SINUSITIS- ACUTE-NOS 11/21/2007  . TB SKIN TEST, POSITIVE 02/06/2010  . THYROID NODULE 03/09/2010    Past Surgical History:  Procedure Laterality Date  . ABDOMINAL HYSTERECTOMY    . BASAL CELL CARCINOMA EXCISION    . CARDIAC CATHETERIZATION    . COLONOSCOPY    . EYE SURGERY Bilateral 08/2018, 09/2018  . KNEE  ARTHROSCOPY Left   . LEFT HEART CATH AND CORONARY ANGIOGRAPHY N/A 01/20/2019   Procedure: LEFT HEART CATH AND CORONARY ANGIOGRAPHY;  Surgeon: Belva Crome, MD;  Location: [facility];  Service: Cardiovascular;  Laterality: N/A;  . LYMPH NODE DISSECTION    . OOPHORECTOMY    . Right total knee replacement    . ROUX-EN-Y GASTRIC BYPASS  2011  . SKIN TAG REMOVAL    . THYROIDECTOMY    . TONSILLECTOMY AND ADENOIDECTOMY    . UPPER GASTROINTESTINAL ENDOSCOPY      Social History   Socioeconomic History  . Marital status: Single    Spouse name: Not on file  . Number of children: 0  . Years of education: 16  . Highest education level: Bachelor's degree (e.g., BA, AB, BS)  Occupational History  . Occupation: Optometrist  Social Needs  . Financial resource strain: Not hard at all  . Food insecurity    Worry: Never true    Inability: Never true  . Transportation needs    Medical: No    Non-medical: No  Tobacco Use  . Smoking status: Former Smoker    Packs/day: 1.50    Years: 15.00    Pack years: 22.50    Types: Cigarettes  . Smokeless tobacco: Never Used  Substance and Sexual Activity  . Alcohol use: Yes    Alcohol/week: 0.0 standard drinks    Comment: rarely  . Drug use: Never  . Sexual  activity: Not on file  Lifestyle  . Physical activity    Days per week: 5 days    Minutes per session: 30 min  . Stress: Not at all  Relationships  . Social Herbalist on phone: Not on file    Gets together: Not on file    Attends religious service: Not on file    Active member of club or organization: Not on file    Attends meetings of clubs or organizations: Not on file    Relationship status: Not on file  . Intimate partner violence    Fear of current or ex partner: Not on file    Emotionally abused: Not on file    Physically abused: Not on file    Forced sexual activity: Not on file  Other Topics Concern  . Not on file  Social History Narrative  . Not on file     Current Outpatient Medications on File Prior to Visit  Medication Sig Dispense Refill  . aspirin EC 81 MG tablet Take 1 tablet (81 mg total) by mouth daily. Starting 01/18/19 prior to cardiac cath    . Biotin-D POWD Take 1 tablet by mouth daily.    . canagliflozin (INVOKANA) 100 MG TABS tablet Take 1 tablet (100 mg total) by mouth daily before breakfast. (Patient taking differently: Take 150 mg by mouth daily before breakfast. ) 90 tablet 3  . Cholecalciferol (VITAMIN D3) 125 MCG (5000 UT) CAPS Take 5,000 Units by mouth daily.    . Colchicine (MITIGARE) 0.6 MG CAPS Take 1 capsule by mouth daily. 90 capsule 1  . diclofenac sodium (VOLTAREN) 1 % GEL Apply 4 g topically 4 (four) times daily. 100 g 11  . docusate sodium (COLACE) 100 MG capsule Take 200 mg by mouth 2 (two) times daily.    . furosemide (LASIX) 20 MG tablet Take 1 tablet (20 mg total) by mouth daily. 90 tablet 3  . gabapentin (NEURONTIN) 300 MG capsule Take 2 capsules (600 mg total) by mouth 2 (two) times daily. 360 capsule 2  . glucose blood test strip 1 each by Other route daily. Use to monitor glucose levels once per day; E11.40 100 each 2  . halobetasol (ULTRAVATE) 0.05 % cream Apply 1 application topically 2 (two) times daily as needed (lichen sclerosus).   2  . HYDROcodone-acetaminophen (NORCO/VICODIN) 5-325 MG tablet Take 1 tablet by mouth daily as needed for moderate pain or severe pain.    . hydrOXYzine (VISTARIL) 25 MG capsule Take 25 mg by mouth at bedtime. Prn for itching    . levocetirizine (XYZAL ALLERGY 24HR) 5 MG tablet Take 1 tablet by mouth daily.    [name] levothyroxine (SYNTHROID, LEVOTHROID) 112 MCG tablet Take 112 mcg by mouth daily before breakfast.    . MAGNESIUM PO Take 1 tablet by mouth daily as needed (cramps).     . metFORMIN (GLUCOPHAGE-XR) 500 MG 24 hr tablet Take 2 tablets (1,000 mg total) by mouth daily. (Patient taking differently: Take 500 mg by mouth 2 (two) times a day. ) 180 tablet 3  . Multiple Vitamin  (MULTIVITAMIN) tablet Take 1 tablet by mouth daily.     . nitroGLYCERIN (NITROSTAT) 0.4 MG SL tablet Place 1 tablet (0.4 mg total) under the tongue every 5 (five) minutes as needed. 25 tablet 3  . phenazopyridine (PYRIDIUM) 100 MG tablet Take 100 mg by mouth 3 (three) times daily as needed for pain.    . Probiotic CAPS Take 1  capsule by mouth daily.    . repaglinide (PRANDIN) 2 MG tablet Take 2 mg by mouth 2 (two) times daily before a meal.    . topiramate (TOPAMAX) 50 MG tablet Take 2 tablets (100 mg total) by mouth 2 (two) times daily. 120 tablet 3  . traMADol (ULTRAM) 50 MG tablet Take 50 mg by mouth 2 (two) times daily as needed for moderate pain.    . TRULICITY 1.5 0000000 SOPN INJECT 1.5 MG INTO THE SKIN ONCE A WEEK. (Patient taking differently: Inject 1.5 mg as directed every Monday. ) 6 pen 11  . TURMERIC CURCUMIN PO Take 1 capsule by mouth daily.    [name] atorvastatin (LIPITOR) 40 MG tablet Take 1 tablet (40 mg total) by mouth daily. (Patient taking differently: Take 40 mg by mouth at bedtime. ) 90 tablet 3   No current facility-administered medications on file prior to visit.     Allergies  Allergen Reactions  . Ace Inhibitors Cough  . Caffeine     PVCs  . Ciprofloxacin Itching  . Morphine And Related Itching    Pt tolerates medication if given with diphenhydramine  . Mucinex [Guaifenesin Er]     PVCs  . Nsaids     Gastric Bypass Surgery - unable to take, tolerates ec 81 aspirin   . Other     Antihistamine-alkylamine - PVCs tolerates benadryl     Family History  Problem Relation Age of Onset  . Multiple sclerosis Sister   . Breast cancer Sister        breast onset age 52  . Osteoporosis Sister   . Diabetes Mother   . Diabetes Father   . Congestive Heart Failure Father   . Hypertension Father   . Myelodysplastic syndrome Father   . Liver cancer Maternal Aunt   . Breast cancer Maternal Aunt   . Diabetes Maternal Aunt   . Colon cancer Maternal Uncle   . Diabetes  Maternal Uncle   . Diabetes Maternal Grandmother   . Diabetes Paternal Grandmother     BP 116/70 (BP Location: Left Arm, Patient Position: Sitting, Cuff Size: Normal)   Pulse 68   Ht 5' 10.5" (1.791 m)   Wt 159 lb 6.4 oz (72.3 kg)   SpO2 96%   BMI 22.55 kg/m    Review of Systems Denies LOC.      Objective:   Physical Exam VITAL SIGNS:  See vs page GENERAL: no distress Pulses: dorsalis pedis intact bilat.   MSK: no deformity of the feet CV: trace bilat leg edema Skin:  no ulcer on the feet.  normal color and temp on the feet.  Neuro: sensation is intact to touch on the feet.     Lab Results  Component Value Date   HGBA1C 5.9 (A) 07/13/2019       Assessment & Plan:  Type 2 DM, with CAD: overontrolled  Hypoglycemia: this limits aggressiveness of glycemic control.  We discussed.  She declines to reduce repaglinde.  She says she'll start start taking just ac.   Edema: This limits rx options   Patient Instructions  Please continue the same medications.    check your blood sugar once a day.  vary the time of day when you check, between before the 3 meals, and at bedtime.  also check if you have symptoms of your blood sugar being too high or too low.  please keep a record of the readings and bring it to your next appointment here (or  you can bring the meter itself).  You can write it on any piece of paper.  please call us sooner if your blood sugar goes below 70, or if you have a lot of readings over 200. Please come back for a follow-up appointment in 6 months.

## 2019-07-14 ENCOUNTER — Other Ambulatory Visit: Payer: Self-pay

## 2019-07-14 ENCOUNTER — Encounter: Payer: Self-pay | Admitting: Family Medicine

## 2019-07-14 DIAGNOSIS — M542 Cervicalgia: Secondary | ICD-10-CM | POA: Diagnosis not present

## 2019-07-14 DIAGNOSIS — R252 Cramp and spasm: Secondary | ICD-10-CM | POA: Diagnosis not present

## 2019-07-14 DIAGNOSIS — M79671 Pain in right foot: Secondary | ICD-10-CM | POA: Diagnosis not present

## 2019-07-14 DIAGNOSIS — Z79899 Other long term (current) drug therapy: Secondary | ICD-10-CM | POA: Diagnosis not present

## 2019-07-14 DIAGNOSIS — G603 Idiopathic progressive neuropathy: Secondary | ICD-10-CM | POA: Diagnosis not present

## 2019-07-14 MED ORDER — PANTOPRAZOLE SODIUM 40 MG PO TBEC: 40.0000 mg | DELAYED_RELEASE_TABLET | Freq: Two times a day (BID) | ORAL | 3 refills | Status: DC

## 2019-07-15 ENCOUNTER — Encounter (HOSPITAL_COMMUNITY)
Admission: RE | Admit: 2019-07-15 | Discharge: 2019-07-15 | Disposition: A | Discharge status: Home / Self Care | Payer: Medicare Other | Source: Ambulatory Visit | Attending: Internal Medicine | Admitting: Internal Medicine

## 2019-07-15 ENCOUNTER — Other Ambulatory Visit: Payer: Self-pay

## 2019-07-15 DIAGNOSIS — I10 Essential (primary) hypertension: Secondary | ICD-10-CM | POA: Insufficient documentation

## 2019-07-15 DIAGNOSIS — Z7984 Long term (current) use of oral hypoglycemic drugs: Secondary | ICD-10-CM | POA: Diagnosis not present

## 2019-07-15 DIAGNOSIS — E785 Hyperlipidemia, unspecified: Secondary | ICD-10-CM | POA: Insufficient documentation

## 2019-07-15 DIAGNOSIS — E890 Postprocedural hypothyroidism: Secondary | ICD-10-CM | POA: Diagnosis not present

## 2019-07-15 DIAGNOSIS — Z7982 Long term (current) use of aspirin: Secondary | ICD-10-CM | POA: Insufficient documentation

## 2019-07-15 DIAGNOSIS — Z79899 Other long term (current) drug therapy: Secondary | ICD-10-CM | POA: Diagnosis not present

## 2019-07-15 DIAGNOSIS — E1142 Type 2 diabetes mellitus with diabetic polyneuropathy: Secondary | ICD-10-CM | POA: Diagnosis not present

## 2019-07-15 DIAGNOSIS — I208 Other forms of angina pectoris: Secondary | ICD-10-CM | POA: Diagnosis not present

## 2019-07-15 DIAGNOSIS — Z9884 Bariatric surgery status: Secondary | ICD-10-CM | POA: Insufficient documentation

## 2019-07-15 DIAGNOSIS — Z79890 Hormone replacement therapy (postmenopausal): Secondary | ICD-10-CM | POA: Insufficient documentation

## 2019-07-17 ENCOUNTER — Encounter (HOSPITAL_COMMUNITY): Payer: Medicare Other

## 2019-07-17 ENCOUNTER — Telehealth: Payer: Self-pay | Admitting: Internal Medicine

## 2019-07-17 ENCOUNTER — Other Ambulatory Visit: Payer: Self-pay

## 2019-07-17 ENCOUNTER — Encounter: Payer: Self-pay | Admitting: Family Medicine

## 2019-07-17 ENCOUNTER — Encounter (HOSPITAL_COMMUNITY)
Admission: RE | Admit: 2019-07-17 | Discharge: 2019-07-17 | Disposition: A | Discharge status: Home / Self Care | Payer: Medicare Other | Source: Ambulatory Visit | Attending: Internal Medicine | Admitting: Internal Medicine

## 2019-07-17 VITALS — BP 104/60 | HR 82 | Temp 97.5°F | Ht 71.0 in | Wt 158.1 lb

## 2019-07-17 DIAGNOSIS — I208 Other forms of angina pectoris: Secondary | ICD-10-CM | POA: Diagnosis not present

## 2019-07-17 DIAGNOSIS — Z7982 Long term (current) use of aspirin: Secondary | ICD-10-CM | POA: Diagnosis not present

## 2019-07-17 DIAGNOSIS — Z79899 Other long term (current) drug therapy: Secondary | ICD-10-CM | POA: Diagnosis not present

## 2019-07-17 DIAGNOSIS — Z79890 Hormone replacement therapy (postmenopausal): Secondary | ICD-10-CM | POA: Diagnosis not present

## 2019-07-17 DIAGNOSIS — Z7984 Long term (current) use of oral hypoglycemic drugs: Secondary | ICD-10-CM | POA: Diagnosis not present

## 2019-07-17 DIAGNOSIS — I10 Essential (primary) hypertension: Secondary | ICD-10-CM | POA: Diagnosis not present

## 2019-07-17 NOTE — Telephone Encounter (Signed)
  Patient was told at last appt to call about her swelling and BP. BP 118/77, Weight up a pound up from yesterday, She has some swelling in ankles is worse. She did have a little SOB this morning. She took a potassium yesterday and had stomach pains yesterday.

## 2019-07-17 NOTE — Progress Notes (Addendum)
Discharge Progress Report  Patient Details  Name: Savannah [name] MRN: [id number] Date of Birth: 03/17/1956 Referring Provider:     CARDIAC REHAB PHASE II ORIENTATION from 06/04/2019 in [place]  Referring Provider  Cherlynn Kaiser A MD        Number of Visits: 18  Reason for Discharge:  Patient reached a stable level of exercise. Patient independent in their exercise. Patient has met program and personal goals.  Smoking History:  Social History   Tobacco Use  Smoking Status Former Smoker  . Packs/day: 1.50  . Years: 15.00  . Pack years: 22.50  . Types: Cigarettes  Smokeless Tobacco Never Used    Diagnosis:  Stable angina ([place])  ADL [facility]SD:   Initial Exercise Prescription: Initial Exercise Prescription - 06/04/19 1100      Date of Initial Exercise RX and Referring Provider   Date  06/04/19    Referring Provider  Cherlynn Kaiser A MD     Expected Discharge Date  07/17/19      NuStep   Level  1    SPM  75    Minutes  15    METs  2.5      Arm Ergometer   Level  1    Watts  25    Minutes  15    METs  2.5      Prescription Details   Frequency (times per week)  3x    Duration  Progress to 30 minutes of continuous aerobic without signs/symptoms of physical distress      Intensity   THRR 40-80% of Max Heartrate  62-124    Ratings of Perceived Exertion  11-13    Perceived Dyspnea  0-4      Progression   Progression  Continue progressive overload as per policy without signs/symptoms or physical distress.      Resistance Training   Training Prescription  Yes    Weight  1lb    Reps  10-15       Discharge Exercise Prescription (Final Exercise Prescription Changes): Exercise Prescription Changes - 07/17/19 1600      Response to Exercise   Blood Pressure (Admit)  104/60    Blood Pressure (Exercise)  120/64    Blood Pressure (Exit)  106/66    Heart Rate (Admit)  82 bpm    Heart Rate (Exercise)  97 bpm    Heart Rate  (Exit)  82 bpm    Rating of Perceived Exertion (Exercise)  12    Symptoms  none    Duration  Continue with 30 min of aerobic exercise without signs/symptoms of physical distress.    Intensity  THRR unchanged      Progression   Progression  Continue to progress workloads to maintain intensity without signs/symptoms of physical distress.    Average METs  3.5      Resistance Training   Training Prescription  Yes    Weight  5lbs    Reps  10-15    Time  10 Minutes      Interval Training   Interval Training  No      NuStep   Level  5    SPM  85    Minutes  15    METs  4.2      Arm Ergometer   Level  4    Minutes  15    METs  2.8      Home Exercise Plan   Plans  to continue exercise at  Home (comment)   Walking   Frequency  Add 4 additional days to program exercise sessions.    Initial Home Exercises Provided  07/06/19       Functional Capacity: 6 Minute Walk    Row Name 06/04/19 1151 07/10/19 1518       6 Minute Walk   Phase  Initial  Discharge    Distance  1772 feet  1672 feet    Distance % Change  -  -5.64 %    Distance Feet Change  -  -100 ft    Walk Time  6 minutes  6 minutes    # of Rest Breaks  0  0    MPH  3.4  3.1    METS  4.2  4.1    RPE  12  11    Perceived Dyspnea   0  0    VO2 Peak  14.72  14.66    Symptoms  No  No    Resting HR  67 bpm  69 bpm    Resting BP  104/62  98/68    Resting Oxygen Saturation   98 %  -    Exercise Oxygen Saturation  during 6 min walk  98 %  -    Max Ex. HR  102 bpm  104 bpm    Max Ex. BP  104/62  120/70    2 Minute Post BP  100/62  100/64       Psychological, QOL, Others - Outcomes: PHQ 2/9: Depression screen [facility] 2/9 07/17/2019 06/04/2019 06/04/2019  Decreased Interest 0 0 0  Down, Depressed, Hopeless 0 0 0  PHQ - 2 Score 0 0 0  Some recent data might be hidden    Quality of Life: Quality of Life - 07/17/19 1532      Quality of Life   Select  Quality of Life      Quality of Life Scores   Health/Function Post   24 %    Socioeconomic Post  21.86 %    Psych/Spiritual Post  21.43 %    Family Post  12 %    GLOBAL Post  22.39 %       Personal Goals: Goals established at orientation with interventions provided to work toward goal. Personal Goals and Risk Factors at Admission - 06/04/19 1207      Core Components/Risk Factors/Patient Goals on Admission    Weight Management  Weight Maintenance    Diabetes  Yes    Intervention  Provide education about signs/symptoms and action to take for hypo/hyperglycemia.;Provide education about proper nutrition, including hydration, and aerobic/resistive exercise prescription along with prescribed medications to achieve blood glucose in normal ranges: Fasting glucose 65-99 mg/dL    Expected Outcomes  Short Term: Participant verbalizes understanding of the signs/symptoms and immediate care of hyper/hypoglycemia, proper foot care and importance of medication, aerobic/resistive exercise and nutrition plan for blood glucose control.;Long Term: Attainment of HbA1C < 7%.    Hypertension  Yes    Intervention  Provide education on lifestyle modifcations including regular physical activity/exercise, weight management, moderate sodium restriction and increased consumption of fresh fruit, vegetables, and low fat dairy, alcohol moderation, and smoking cessation.;Monitor prescription use compliance.    Expected Outcomes  Short Term: Continued assessment and intervention until BP is < 140/80m HG in hypertensive participants. < 130/833mHG in hypertensive participants with diabetes, heart failure or chronic kidney disease.;Long Term: Maintenance of blood pressure at goal levels.  Lipids  Yes    Intervention  Provide education and support for participant on nutrition & aerobic/resistive exercise along with prescribed medications to achieve LDL <48m, HDL >453m    Expected Outcomes  Short Term: Participant states understanding of desired cholesterol values and is compliant with  medications prescribed. Participant is following exercise prescription and nutrition guidelines.;Long Term: Cholesterol controlled with medications as prescribed, with individualized exercise RX and with personalized nutrition plan. Value goals: LDL < 7083mHDL > 40 mg.        Personal Goals Discharge: Goals and Risk Factor Review    Row Name 07/02/19 1613 07/17/19 1542 07/17/19 1543         Core Components/Risk Factors/Patient Goals Review   Personal Goals Review  Weight Management/Obesity;Lipids;Diabetes;Hypertension  Weight Management/Obesity;Lipids;Diabetes;Hypertension  Weight Management/Obesity;Lipids;Diabetes;Hypertension     Review  Mare's vital signs and CBG's have been stable. MarHuntley Estelle doing well with exercise.  -  Huntley Estellell graduate from [facility] [employer]. She has successfully completed 18 exercise session and feels she has benefited through improvement in her stamina and strength. She is able to climb steps with better ease. She plans to continue her exercise through daily walking and stationary bike riding.     Expected Outcomes  Patient will continue to participte in phase 2 cardiac rehab for exercise, nutrition and lifestyle modifications.  -  Mare will continue her healthy lifestyle by adhering to exercise, nutrition and lifestyle modifications.        Exercise Goals and Review: Exercise Goals    Row Name 06/04/19 1152             Exercise Goals   Increase Physical Activity  Yes       Intervention  Provide advice, education, support and counseling about physical activity/exercise needs.;Develop an individualized exercise prescription for aerobic and resistive training based on initial evaluation findings, risk stratification, comorbidities and participant's personal goals.       Expected Outcomes  Short Term: Attend rehab on a regular basis to increase amount of physical activity.;Long Term: Add in home exercise to make exercise part of routine and to increase amount of physical  activity.;Long Term: Exercising regularly at least 3-5 days a week.       Increase Strength and Stamina  Yes       Intervention  Provide advice, education, support and counseling about physical activity/exercise needs.;Develop an individualized exercise prescription for aerobic and resistive training based on initial evaluation findings, risk stratification, comorbidities and participant's personal goals.       Expected Outcomes  Short Term: Increase workloads from initial exercise prescription for resistance, speed, and METs.;Short Term: Perform resistance training exercises routinely during rehab and add in resistance training at home;Long Term: Improve cardiorespiratory fitness, muscular endurance and strength as measured by increased METs and functional capacity (6MWT)       Able to understand and use rate of perceived exertion (RPE) scale  Yes       Intervention  Provide education and explanation on how to use RPE scale       Expected Outcomes  Short Term: Able to use RPE daily in rehab to express subjective intensity level;Long Term:  Able to use RPE to guide intensity level when exercising independently       Knowledge and understanding of Target Heart Rate Range (THRR)  Yes       Intervention  Provide education and explanation of THRR including how the numbers were predicted and where they are located for reference  Expected Outcomes  Short Term: Able to state/look up THRR;Long Term: Able to use THRR to govern intensity when exercising independently;Short Term: Able to use daily as guideline for intensity in rehab       Able to check pulse independently  Yes       Intervention  Provide education and demonstration on how to check pulse in carotid and radial arteries.;Review the importance of being able to check your own pulse for safety during independent exercise       Expected Outcomes  Short Term: Able to explain why pulse checking is important during independent exercise;Long Term: Able  to check pulse independently and accurately       Understanding of Exercise Prescription  Yes       Intervention  Provide education, explanation, and written materials on patient's individual exercise prescription       Expected Outcomes  Long Term: Able to explain home exercise prescription to exercise independently;Short Term: Able to explain program exercise prescription          Exercise Goals Re-Evaluation: Exercise Goals Re-Evaluation    [place] Name 06/08/19 1445 07/02/19 1358 07/06/19 1420 07/17/19 1544       Exercise Goal Re-Evaluation   Exercise Goals Review  Increase Physical Activity;Able to understand and use rate of perceived exertion (RPE) scale  Increase Physical Activity;Increase Strength and Stamina;Able to understand and use rate of perceived exertion (RPE) scale;Knowledge and understanding of Target Heart Rate Range (THRR);Able to check pulse independently;Understanding of Exercise Prescription  Increase Physical Activity;Increase Strength and Stamina;Able to understand and use rate of perceived exertion (RPE) scale;Knowledge and understanding of Target Heart Rate Range (THRR);Able to check pulse independently;Understanding of Exercise Prescription  Increase Physical Activity;Increase Strength and Stamina;Able to understand and use rate of perceived exertion (RPE) scale;Knowledge and understanding of Target Heart Rate Range (THRR);Able to check pulse independently;Understanding of Exercise Prescription    Comments  Patient able to understand and use RPE scale appropriately. Pt increased workload on recumbent stepper to level 4.0 without issue. Pt has less upper body strength, therefore workload on arm ergometer maintained at level 1.0.  Reviewed METs and goals with Pt. Pt is currently walking 60 minutes 5 days/week. Pt stated she has a bike she is going to start using but needs to be assembled. Pt is tolerating exercise Rx well and is increasing workloadds gradually.  Reviewed home  exercise guidelines with patient including THRR, RPE scale and endpoints for exercise. Pt is able to check pulse independently. Pt is walking a littel more than 3 miles in 60 minutes daily. Pt doens't have hand weights but uses bottle for her resistance. Pt is progressing well in cardiac rehab.  Pt completed the [facility] program today. Pt progressed thorugh the program well and ended the program with an average MET elvel in the 3-4 range. Pt stated she is going to continue to exercise at home by walking and using a Nustep 30-45 minutes 5-7 days per week. Stated she would like to purchase some hand weights to add to her exercise.    Expected Outcomes  Increase workloads as tolerated to help improve strength and stamina.  Will continue ot monitor and progress Pt as tolerated.  Patient will continue daily walking routine to help achieve personal health and fitness goals.  Pt will continue to exercise at home.       Nutrition & Weight - Outcomes: Pre Biometrics - 06/04/19 1153      Pre Biometrics   Height  5'  11" (1.803 m)    Weight  160 lb 15 oz (73 kg)    Waist Circumference  34 inches    Hip Circumference  40 inches    Waist to Hip Ratio  0.85 %    BMI (Calculated)  22.46    Triceps Skinfold  26 mm    % Body Fat  31.4 %    Grip Strength  22 kg    Flexibility  0 in    Single Leg Stand  2.81 seconds      Post Biometrics - 07/17/19 1556       Post  Biometrics   Height  5' 11"  (1.803 m)    Weight  158 lb 1.1 oz (71.7 kg)    Waist Circumference  33 inches    Hip Circumference  39.5 inches    Waist to Hip Ratio  0.84 %    BMI (Calculated)  22.06    Triceps Skinfold  26 mm    % Body Fat  34.1 %    Grip Strength  26 kg    Flexibility  0 in    Single Leg Stand  5.18 seconds       Nutrition:   Nutrition Discharge:   Education Questionnaire Score: Knowledge Questionnaire Score - 07/17/19 1529      Knowledge Questionnaire Score   Post Score  23/24       Goals reviewed with patient;  copy given to patient. Pt graduated from cardiac rehab program today with completion of 18 exercise sessions in Phase II. Pt maintained good attendance and progressed nicely during his participation in rehab as evidenced by increased MET level.   Medication list reconciled. Repeat  PHQ score-0.  Pt has made significant lifestyle changes and should be commended for his success. Pt feels he has achieved his goals during cardiac rehab.

## 2019-07-17 NOTE — Telephone Encounter (Signed)
Returned call to pt she states that she does not have any potassium. She states that she went for regular walk and by the 2nd mile she was cramping in her feet and legs and was in pain. She states that her BP Wednesday was 115/78 and she "had to take her lasix" she states that she is SOB today but I cannot hear it on the phone her BP today is 118/77. She states that she will take her lasix for swelling if her weight is > 2-3 # in a day as discussed and SBP >110. She states that she seems to remember that she was sensitive when she was taking HCTZ and her potassium continued to be low and she had a lot of cramping. She is trying to eat foods higher in  K+just to make sure that it is not too low. She will CB Monday/tuesday with weights and BP

## 2019-07-21 ENCOUNTER — Other Ambulatory Visit: Payer: Self-pay | Admitting: Endocrinology

## 2019-07-22 ENCOUNTER — Ambulatory Visit: Payer: Medicare Other | Admitting: Medical

## 2019-07-24 DIAGNOSIS — M722 Plantar fascial fibromatosis: Secondary | ICD-10-CM | POA: Diagnosis not present

## 2019-07-24 DIAGNOSIS — M67372 Transient synovitis, left ankle and foot: Secondary | ICD-10-CM | POA: Diagnosis not present

## 2019-07-29 NOTE — Progress Notes (Signed)
Office Visit Note  Patient: Savannah [name]             Date of Birth: November 27, 1955           MRN: [id number]             PCP: Vivi Barrack, MD Referring: Vivi Barrack, MD Visit Date: 08/12/2019 Occupation: @GUAROCC @  Subjective:  Left ankle joint pain   History of Present Illness: Savannah [name] is a 63 y.o. female with history of inflammatory arthritis and osteoarthritis.  Patient takes colchicine 0.6 mg by mouth daily.  She states she often has interruptions in taking colchicine due to being prescribed Diflucan for recurrent yeast infections.  She reports that when she is off of colchicine she experiences increased pain and swelling in both hands.  She is also having increased pain in the right knee which is replaced.  She has chronic left foot and ankle joint pain.  She is seeing Dr. Gershon Mussel and has had 7+ cortisone injections in the past.  She states she does have a heel spur and has difficulty walking at times.  She is currently wearing a boot but she has been wearing for the past 2 weeks.  She states she is continues to try to walk 2 to 3 miles per day for exercise.  She takes tramadol very sparingly for pain relief.  She is concerned that something systemic is pain that is causing increased joint pain and swelling especially in the left ankle joint.  Activities of Daily Living:  Patient reports morning stiffness for 0  minutes.   Patient Reports nocturnal pain.  Difficulty dressing/grooming: Denies Difficulty climbing stairs: Reports Difficulty getting out of chair: Reports Difficulty using hands for taps, buttons, cutlery, and/or writing: Reports  Review of Systems  Constitutional: Positive for fatigue.  HENT: Positive for mouth dryness. Negative for mouth sores and nose dryness.   Eyes: Positive for dryness. Negative for pain and visual disturbance.  Respiratory: Negative for cough, hemoptysis, shortness of breath and difficulty breathing.   Cardiovascular: Positive for  palpitations (PVCs). Negative for chest pain, hypertension and swelling in legs/feet.  Gastrointestinal: Positive for constipation and diarrhea. Negative for blood in stool.  Endocrine: Negative for increased urination.  Genitourinary: Negative for painful urination.  Musculoskeletal: Positive for arthralgias, joint pain, joint swelling and morning stiffness. Negative for myalgias, muscle weakness, muscle tenderness and myalgias.  Skin: Negative for color change, pallor, rash, hair loss, nodules/bumps, skin tightness, ulcers and sensitivity to sunlight.  Allergic/Immunologic: Negative for susceptible to infections.  Neurological: Negative for dizziness, numbness, headaches and weakness.  Hematological: Negative for swollen glands.  Psychiatric/Behavioral: Negative for depressed mood and sleep disturbance. The patient is not nervous/anxious.     PMFS History:  Patient Active Problem List   Diagnosis Date Noted   Abdominal pain, epigastric 06/25/2019   Irregular bowel habits 06/25/2019   Coronary artery disease involving native coronary artery of native heart without angina pectoris    Temporomandibular joint disorder 12/05/2018   Fracture of upper limb 12/05/2018   Fatigue 12/05/2018   Inverted nipple 12/05/2018   Senile purpura ([place]) 11/21/2018   GERD with esophagitis 11/21/2018   Pseudogout involving multiple joints 11/21/2018   Lipoma 37/16/9678   Lichen sclerosus 93/81/0175   H/O gastric bypass 11/16/2016   GERD (gastroesophageal reflux disease) 11/16/2016   Uterine cancer [facility]) s/p hysterectomy 1997 01/04/2012   Postsurgical hypothyroidism 09/19/2010   Generalized abdominal pain 10/20/2009   Diabetic neuropathy ([place]) 11/21/2007  Allergic rhinitis 11/21/2007   Type 2 diabetes mellitus with diabetic neuropathy, unspecified ([place]) 07/08/2007   Dyslipidemia associated with type 2 diabetes mellitus ([place]) 07/08/2007    Past Medical History:  Diagnosis Date    ABDOMINAL PAIN, CHRONIC 10/20/2009   Acute cystitis 08/17/2009   ALLERGIC RHINITIS 11/21/2007   Allergy    ASYMPTOMATIC POSTMENOPAUSAL STATUS 09/29/2008   Bariatric surgery status 07/07/2010   Bariatric surgery status 07/07/2010   Qualifier: Diagnosis of  By: Loanne Drilling MD, Sean A    Cancer [facility])    uterine   Cataract    Coronary artery disease    DEPRESSION 11/21/2007   DIABETES MELLITUS, TYPE II 07/08/2007   DYSPNEA 05/20/2009   Edema 04/20/6167   Eosinophilic esophagitis 01/16/2901   FEVER UNSPECIFIED 10/20/2009   FEVER, HX OF 03/09/2010   GERD (gastroesophageal reflux disease)    Headache(784.0) 02/06/2010   Hepatomegaly 01/06/2008   HYPERLIPIDEMIA 07/08/2007   HYPERTENSION 07/08/2007   LEUKOPENIA, MILD 09/29/2008   OBESITY 01/06/2008   OSTEOARTHRITIS 01/06/2008   Other chronic nonalcoholic liver disease 11/22/5518   PERIPHERAL NEUROPATHY 11/21/2007   Peripheral neuropathy    Postsurgical hypothyroidism 09/19/2010   SINUSITIS- ACUTE-NOS 11/21/2007   TB SKIN TEST, POSITIVE 02/06/2010   THYROID NODULE 03/09/2010    Family History  Problem Relation Age of Onset   Multiple sclerosis Sister    Breast cancer Sister        breast onset age 74   Osteoporosis Sister    Diabetes Mother    Diabetes Father    Congestive Heart Failure Father    Hypertension Father    Myelodysplastic syndrome Father    Liver cancer Maternal Aunt    Breast cancer Maternal Aunt    Diabetes Maternal Aunt    Colon cancer Maternal Uncle    Diabetes Maternal Uncle    Diabetes Maternal Grandmother    Diabetes Paternal Grandmother    Past Surgical History:  Procedure Laterality Date   ABDOMINAL HYSTERECTOMY     BASAL CELL CARCINOMA EXCISION     CARDIAC CATHETERIZATION     COLONOSCOPY     EYE SURGERY Bilateral 08/2018, 09/2018   KNEE ARTHROSCOPY Left    LEFT HEART CATH AND CORONARY ANGIOGRAPHY N/A 01/20/2019   Procedure: LEFT HEART CATH AND CORONARY ANGIOGRAPHY;  Surgeon:  Belva Crome, MD;  Location: [facility];  Service: Cardiovascular;  Laterality: N/A;   LYMPH NODE DISSECTION     OOPHORECTOMY     Right total knee replacement     ROUX-EN-Y GASTRIC BYPASS  2011   SKIN TAG REMOVAL     THYROIDECTOMY     TONSILLECTOMY AND ADENOIDECTOMY     UPPER GASTROINTESTINAL ENDOSCOPY     Social History   Social History Narrative   Not on file   Immunization History  Administered Date(s) Administered   Fluad Quad(high Dose 65+) 07/13/2019   Influenza Whole 07/30/2008, 08/17/2009, 09/19/2010   Influenza,inj,Quad PF,6-35 Mos 07/14/2015   Influenza-Unspecified 10/13/2011, 08/12/2013, 07/23/2018   Pneumococcal Conjugate-13 10/14/2015   Pneumococcal Polysaccharide-23 06/25/2014   Zoster 06/25/2014     Objective: Vital Signs: BP 137/73 (BP Location: Left Arm, Patient Position: Sitting, Cuff Size: Normal)    Pulse 71    Resp 14    Ht 5' 10.5" (1.791 m)    Wt 149 lb (67.6 kg)    BMI 21.08 kg/m    Physical Exam Vitals signs and nursing note reviewed.  Constitutional:      Appearance: She is well-developed.  HENT:     Head: Normocephalic and atraumatic.  Eyes:     Conjunctiva/sclera: Conjunctivae normal.  Neck:     Musculoskeletal: Normal range of motion.  Cardiovascular:     Rate and Rhythm: Normal rate and regular rhythm.     Heart sounds: Normal heart sounds.  Pulmonary:     Effort: Pulmonary effort is normal.     Breath sounds: Normal breath sounds.  Abdominal:     General: Bowel sounds are normal.     Palpations: Abdomen is soft.  Lymphadenopathy:     Cervical: No cervical adenopathy.  Skin:    General: Skin is warm and dry.     Capillary Refill: Capillary refill takes less than 2 seconds.  Neurological:     Mental Status: She is alert and oriented to person, place, and time.  Psychiatric:        Behavior: Behavior normal.      Musculoskeletal Exam: C-spine limited range of motion with lateral rotation.  Thoracic and  lumbar spine good range of motion.  Shoulder joints elbow joints, wrist joints, MCPs and PIPs and DIPs good range of motion with no synovitis.  She has PIP and DIP synovial thickening. CMC joint synovial thickening bilaterally.  Hip joints have good range of motion.  Right knee replacement has good range of motion with warmth but no effusion.  Left knee has good range of motion with no warmth or effusion.  Left knee joint crepitus.  Left ankle tenderness and warmth noted.  CDAI Exam: CDAI Score: -- Patient Global: --; Provider Global: -- Swollen: --; Tender: -- Joint Exam   No joint exam has been documented for this visit   There is currently no information documented on the homunculus. Go to the Rheumatology activity and complete the homunculus joint exam.  Investigation: No additional findings.  Imaging: Xr Ankle Complete Left  Result Date: 08/12/2019 No tibiotalar subtalar joint space narrowing was noted.  Some cystic changes were noted at the medial aspect of tibia.  A cyst was noted in fibula as well.  Large calcaneal spur was noted. Impression: These findings are consistent with osteoarthritis.  Xr Foot 2 Views Left  Result Date: 08/12/2019 Juxta-articular osteopenia was noted.  Cystic changes were noted in the proximal fourth and fifth phalanx.  PIP and DIP narrowing with spurring was noted.  No intertarsal or tibiotalar joint space narrowing was noted.  Calcaneal spur was noted.  No radiographic progression was noted from her previous x-rays of Mar 21, 2018. Impression: These findings are consistent with inflammatory arthritis.  Differential diagnosis could be rheumatoid arthritis or crystal induced arthropathy.  Xr Hand 2 View Left  Result Date: 08/12/2019 Juxta-articular osteopenia was noted.  Narrowing of first second and third MCP joints was noted.  PIP and DIP joint narrowing was noted.  Some cystic changes were noted in the PIP and DIP joints.  No significant metacarpocarpal  intercarpal or radiocarpal joint space narrowing was noted.  Some calcification was noted in the wrist joint.  CMC narrowing was noted.  No radiographic progression was noted when compared to the x-rays obtained 1 year ago. Impression: These findings could be consistent with inflammatory arthritis like rheumatoid arthritis or crystal induced arthropathy.  Xr Hand 2 View Right  Result Date: 08/12/2019 Juxta-articular osteopenia was noted.  Narrowing of first second and third MCP joints was noted.  PIP and DIP joint narrowing was noted.  Some cystic changes were noted in the PIP and DIP joints.  No  significant metacarpocarpal intercarpal or radiocarpal joint space narrowing was noted.  Some calcification was noted in the wrist joint.  CMC narrowing and spurring was noted.  No radiographic progression was noted when compared to the x-rays obtained 1 year ago. Impression: These findings could be consistent with inflammatory arthritis like   Recent Labs: Lab Results  Component Value Date   WBC 4.3 01/09/2019   HGB 13.8 01/09/2019   PLT 180 01/09/2019   NA 144 07/06/2019   K 4.2 07/06/2019   CL 106 07/06/2019   CO2 22 07/06/2019   GLUCOSE 90 07/06/2019   BUN 22 07/06/2019   CREATININE 1.07 (H) 07/06/2019   BILITOT 0.4 03/21/2018   ALKPHOS 79 01/11/2017   AST 19 03/21/2018   ALT 23 03/21/2018   PROT 6.3 03/21/2018   PROT 6.7 03/21/2018   ALBUMIN 3.9 01/11/2017   CALCIUM 9.1 07/06/2019   GFRAA 63 07/06/2019    Speciality Comments: No specialty comments available.  Procedures:  No procedures performed Allergies: Ace inhibitors, Caffeine, Ciprofloxacin, Morphine and related, Mucinex [guaifenesin er], Nsaids, and Other   Assessment / Plan:     Visit Diagnoses: Inflammatory arthritis - RF-, anti-CCP-, 14-3-3 eta-, ANA-, HLAB27-, uric acid 3.8: She has no obvious synovitis on examination.  She has chronic pain in both hands and the left ankle joint.  She has been taking colchicine 0.6 mg 1  capsule by mouth daily.  She reports that if she misses 1 week or more of colchicine she will experience increased stiffness and joint swelling in both hands.  According to the patient positive colchicine has increase of scheduling and she is unable to afford it at this time.  She has been seeing Dr. Gershon Mussel for evaluation of left ankle pain.  She has had 7+ cortisone injections according to the patient.  She is currently wearing a walking boot.  She is concerned about an underlying systemic disorder. We reviewed autoimmune lab work which has been negative in the past.  We also reviewed previous x-rays and updated x-rays of both hands, left ankle, and left foot.  There has been no radiographic progression since her previous x-rays.  X-ray findings were consistent with inflammatory arthritis versus crystal arthropathy.  We discussed starting her on a trial of Plaquenil to see how she does, but she declined at this time. She is apprehensive of potential side effects of PLQ.  We also discussed a list of natural anti-inflammatories that she can try taking as well as tylenol as needed for pain relief. She states she is unable to take NSAIDs and plans on discontinuing colchicine.  She was advised to notify us if she develops increased joint pain or joint swelling.  She will follow up in 6 months.- Plan: XR Hand 2 View Left, XR Hand 2 View Right  Primary osteoarthritis of both hands - She has PIP and DIP synovial thickening.  CMC joint synovial thickening bilaterally.  No tenderness or synovitis noted.  She colchicine to 1 tablet daily.  She states that due to the cost of colchicine she would like to change medications.  We discussed natural anti-inflammatories today.  X-rays of both hands were obtained and did not reveal any radiographic progression when compared to previous x-rays.  She cannot tolerate taking NSAIDs and has had elevated creatinine in the past.- Plan: XR Hand 2 View Left, XR Hand 2 View Right  Primary  osteoarthritis of left knee: She has good ROM with no discomfort. Left knee crepitus.  Status post total right knee replacement: Chronic pain.  She has good ROM with some discomfort.  Warmth but no effusion noted.   Pain in left ankle and joints of left foot -She has chronic left ankle joint pain and left foot pain.  She experiences tenderness on the dorsal aspect of the left foot at times.  She has been following up with Dr. Gershon Mussel and has had 7+ cortisone injections in the past.  She is currently wearing a walking boot.  She continues to try to walk 2 to 3 miles per day for exercise.  At times she has difficulty walking.  X-rays of the left ankle joint were obtained today.  She has no tibiotalar or subtalar joint space narrowing.  Plan: XR Ankle Complete Left, XR Foot 2 Views Left  Pain in both hands - She has PIP, DIP, and CMC joint synovial thickening.  She has intermittent pain in both hands and experiences joint swelling if she misses a week or more of colchicine.  She takes colchicine 0.6 mg 1 capsule by mouth daily.  X-rays of both hands were obtained today.  No radiographic progression noted.     Plan: XR Hand 2 View Left, XR Hand 2 View Right  Primary osteoarthritis of both feet: She has PIP and DIP synovial thickening consistent with osteoarthritis of both feet.  She has chronic pain in the left ankle joint. She has been seeing Dr. Gershon Mussel, and she has had repeated cortisone injections (7+ injections according to the patient).  She is currently wearing a boot.  She has difficulty walking at times due to the discomfort she experiences.  She tries to walk 2-3 miles daily for exercise.     Other medical conditions are listed as follows:   History of gastroesophageal reflux (GERD)  Status post gastric bypass for obesity  History of diabetes mellitus, type II  Positive PPD  Eosinophilic esophagitis  Postsurgical hypothyroidism  NASH (nonalcoholic steatohepatitis)  History of  hyperlipidemia  History of uterine cancer  History of peripheral neuropathy   Orders: Orders Placed This Encounter  Procedures   XR Ankle Complete Left   XR Hand 2 View Left   XR Hand 2 View Right   XR Foot 2 Views Left   No orders of the defined types were placed in this encounter.   Face-to-face time spent with patient was 30 minutes. Greater than 50% of time was spent in counseling and coordination of care.  Follow-Up Instructions: Return in about 6 months (around 02/09/2020) for Inflammatory arthritis , Osteoarthritis.   Savannah Neas, PA-C   I examined and evaluated the patient with Savannah Sams PA.  Patient had multiple complaints today.  She states she has intermittent swelling in her hands.  No synovitis was noted in her hands.  Her discomfort is mostly in her DIP joints and CMC joints.  She also complains of ongoing discomfort in her right knee which is been replaced.  She complains of ongoing discomfort in her feet.  Her nose did not see any synovitis.  X-rays obtained today showed no radiographic progression over 1 year.  She cannot afford colchicine.  I had detailed discussion regarding using natural anti-inflammatories with Tylenol.  We also discussed possible trial of Plaquenil which I do not think at this point is necessary and she was in agreement.  The plan of care was discussed as noted above.  Bo Merino, MD  Note - This record has been created using Editor, commissioning.  Chart  creation errors have been sought, but may not always  have been located. Such creation errors do not reflect on  the standard of medical care.

## 2019-07-30 DIAGNOSIS — M722 Plantar fascial fibromatosis: Secondary | ICD-10-CM | POA: Diagnosis not present

## 2019-07-30 DIAGNOSIS — M67372 Transient synovitis, left ankle and foot: Secondary | ICD-10-CM | POA: Diagnosis not present

## 2019-07-31 DIAGNOSIS — Z23 Encounter for immunization: Secondary | ICD-10-CM | POA: Diagnosis not present

## 2019-07-31 DIAGNOSIS — L309 Dermatitis, unspecified: Secondary | ICD-10-CM | POA: Diagnosis not present

## 2019-07-31 DIAGNOSIS — L601 Onycholysis: Secondary | ICD-10-CM | POA: Diagnosis not present

## 2019-08-06 DIAGNOSIS — M67372 Transient synovitis, left ankle and foot: Secondary | ICD-10-CM | POA: Diagnosis not present

## 2019-08-06 DIAGNOSIS — M722 Plantar fascial fibromatosis: Secondary | ICD-10-CM | POA: Diagnosis not present

## 2019-08-07 ENCOUNTER — Telehealth: Payer: Self-pay | Admitting: Endocrinology

## 2019-08-07 ENCOUNTER — Other Ambulatory Visit: Payer: Self-pay | Admitting: Endocrinology

## 2019-08-07 NOTE — Telephone Encounter (Signed)
MEDICATION: glucose blood test strip - patient was testing 3 times a week when she was in cardiac rehab and has now ran out.  PHARMACY:  [id number] [place] [place] - [address]Y :   IS PATIENT OUT OF MEDICATION: Yes  IF NOT; HOW MUCH IS LEFT:   LAST APPOINTMENT DATE: @9 /25/2020  NEXT APPOINTMENT DATE:@3 /11/2019  DO WE HAVE YOUR PERMISSION TO LEAVE A DETAILED MESSAGE:  OTHER COMMENTS:    **Let patient know to contact pharmacy at the end of the day to make sure medication is ready. **  ** Please notify patient to allow 48-72 hours to process**  **Encourage patient to contact the pharmacy for refills or [facility]

## 2019-08-10 ENCOUNTER — Other Ambulatory Visit: Payer: Self-pay

## 2019-08-10 DIAGNOSIS — E1140 Type 2 diabetes mellitus with diabetic neuropathy, unspecified: Secondary | ICD-10-CM

## 2019-08-10 MED ORDER — GLUCOSE BLOOD VI STRP: 1.0000 | ORAL_STRIP | Freq: Three times a day (TID) | 2 refills | Status: DC

## 2019-08-10 NOTE — Telephone Encounter (Signed)
glucose blood test strip 100 each 2 08/10/2019    Sig - Route: 1 each by Other route 3 (three) times daily. Use to monitor glucose levels TID; E11.40 - Other   Sent to pharmacy as: glucose blood test strip   E-Prescribing Status: Receipt confirmed by pharmacy (08/10/2019 7:26 AM EDT)

## 2019-08-11 ENCOUNTER — Other Ambulatory Visit: Payer: Self-pay

## 2019-08-11 ENCOUNTER — Telehealth: Payer: Self-pay | Admitting: Endocrinology

## 2019-08-11 DIAGNOSIS — M5412 Radiculopathy, cervical region: Secondary | ICD-10-CM | POA: Diagnosis not present

## 2019-08-11 DIAGNOSIS — Z79899 Other long term (current) drug therapy: Secondary | ICD-10-CM | POA: Diagnosis not present

## 2019-08-11 DIAGNOSIS — M545 Low back pain: Secondary | ICD-10-CM | POA: Diagnosis not present

## 2019-08-11 DIAGNOSIS — E1140 Type 2 diabetes mellitus with diabetic neuropathy, unspecified: Secondary | ICD-10-CM

## 2019-08-11 MED ORDER — GLUCOSE BLOOD VI STRP: 1.0000 | ORAL_STRIP | Freq: Three times a day (TID) | 2 refills | Status: DC

## 2019-08-11 NOTE — Telephone Encounter (Signed)
Patient called re: Patient requests RX for glucose blood test strip [facility] to [facility] on [address] (due to [facility] contract with [facility]-patient is unable to get the above RX [facility])

## 2019-08-11 NOTE — Telephone Encounter (Signed)
glucose blood test strip 100 each 2 08/11/2019    Sig - Route: 1 each by Other route 3 (three) times daily. Use to monitor glucose levels TID; E11.40 - Other   Sent to pharmacy as: glucose blood test strip   E-Prescribing Status: Receipt confirmed by pharmacy (08/11/2019 1:04 PM EDT)

## 2019-08-12 ENCOUNTER — Other Ambulatory Visit: Payer: Self-pay

## 2019-08-12 ENCOUNTER — Ambulatory Visit: Payer: Self-pay

## 2019-08-12 ENCOUNTER — Ambulatory Visit (INDEPENDENT_AMBULATORY_CARE_PROVIDER_SITE_OTHER): Payer: Medicare Other | Admitting: Rheumatology

## 2019-08-12 ENCOUNTER — Encounter: Payer: Self-pay | Admitting: Rheumatology

## 2019-08-12 VITALS — BP 137/73 | HR 71 | Resp 14 | Ht 70.5 in | Wt 149.0 lb

## 2019-08-12 DIAGNOSIS — K7581 Nonalcoholic steatohepatitis (NASH): Secondary | ICD-10-CM

## 2019-08-12 DIAGNOSIS — Z9884 Bariatric surgery status: Secondary | ICD-10-CM

## 2019-08-12 DIAGNOSIS — M1712 Unilateral primary osteoarthritis, left knee: Secondary | ICD-10-CM | POA: Diagnosis not present

## 2019-08-12 DIAGNOSIS — Z8542 Personal history of malignant neoplasm of other parts of uterus: Secondary | ICD-10-CM

## 2019-08-12 DIAGNOSIS — Z8639 Personal history of other endocrine, nutritional and metabolic disease: Secondary | ICD-10-CM

## 2019-08-12 DIAGNOSIS — M79641 Pain in right hand: Secondary | ICD-10-CM

## 2019-08-12 DIAGNOSIS — M19041 Primary osteoarthritis, right hand: Secondary | ICD-10-CM

## 2019-08-12 DIAGNOSIS — M25572 Pain in left ankle and joints of left foot: Secondary | ICD-10-CM

## 2019-08-12 DIAGNOSIS — M1990 Unspecified osteoarthritis, unspecified site: Secondary | ICD-10-CM | POA: Diagnosis not present

## 2019-08-12 DIAGNOSIS — M19071 Primary osteoarthritis, right ankle and foot: Secondary | ICD-10-CM | POA: Diagnosis not present

## 2019-08-12 DIAGNOSIS — K200 Eosinophilic esophagitis: Secondary | ICD-10-CM

## 2019-08-12 DIAGNOSIS — R7611 Nonspecific reaction to tuberculin skin test without active tuberculosis: Secondary | ICD-10-CM

## 2019-08-12 DIAGNOSIS — Z8669 Personal history of other diseases of the nervous system and sense organs: Secondary | ICD-10-CM

## 2019-08-12 DIAGNOSIS — M19072 Primary osteoarthritis, left ankle and foot: Secondary | ICD-10-CM

## 2019-08-12 DIAGNOSIS — Z8719 Personal history of other diseases of the digestive system: Secondary | ICD-10-CM | POA: Diagnosis not present

## 2019-08-12 DIAGNOSIS — M19042 Primary osteoarthritis, left hand: Secondary | ICD-10-CM

## 2019-08-12 DIAGNOSIS — E890 Postprocedural hypothyroidism: Secondary | ICD-10-CM

## 2019-08-12 DIAGNOSIS — I208 Other forms of angina pectoris: Secondary | ICD-10-CM

## 2019-08-12 DIAGNOSIS — Z96651 Presence of right artificial knee joint: Secondary | ICD-10-CM | POA: Diagnosis not present

## 2019-08-12 DIAGNOSIS — M79642 Pain in left hand: Secondary | ICD-10-CM

## 2019-08-13 ENCOUNTER — Ambulatory Visit: Payer: Self-pay | Admitting: Rheumatology

## 2019-08-14 DIAGNOSIS — Z85828 Personal history of other malignant neoplasm of skin: Secondary | ICD-10-CM | POA: Diagnosis not present

## 2019-08-14 DIAGNOSIS — L601 Onycholysis: Secondary | ICD-10-CM | POA: Diagnosis not present

## 2019-08-14 DIAGNOSIS — L309 Dermatitis, unspecified: Secondary | ICD-10-CM | POA: Diagnosis not present

## 2019-08-14 DIAGNOSIS — M722 Plantar fascial fibromatosis: Secondary | ICD-10-CM | POA: Diagnosis not present

## 2019-08-14 DIAGNOSIS — S51811A Laceration without foreign body of right forearm, initial encounter: Secondary | ICD-10-CM | POA: Diagnosis not present

## 2019-08-14 DIAGNOSIS — Z23 Encounter for immunization: Secondary | ICD-10-CM | POA: Diagnosis not present

## 2019-08-14 DIAGNOSIS — S51011A Laceration without foreign body of right elbow, initial encounter: Secondary | ICD-10-CM | POA: Diagnosis not present

## 2019-08-14 DIAGNOSIS — M67372 Transient synovitis, left ankle and foot: Secondary | ICD-10-CM | POA: Diagnosis not present

## 2019-08-17 ENCOUNTER — Telehealth: Payer: Self-pay

## 2019-08-17 NOTE — Telephone Encounter (Signed)
Company: [facility]  Document: DWO testing strips Other records requested: None requested  All above requested information has been faxed successfully to [employer] listed above. Documents and fax confirmation have been placed in the faxed file for future reference.

## 2019-08-18 ENCOUNTER — Encounter: Payer: Self-pay | Admitting: Gastroenterology

## 2019-08-18 ENCOUNTER — Ambulatory Visit (INDEPENDENT_AMBULATORY_CARE_PROVIDER_SITE_OTHER): Payer: Medicare Other | Admitting: Gastroenterology

## 2019-08-18 VITALS — BP 120/70 | HR 80 | Temp 97.7°F | Ht 71.0 in | Wt 153.0 lb

## 2019-08-18 DIAGNOSIS — K5909 Other constipation: Secondary | ICD-10-CM

## 2019-08-18 DIAGNOSIS — R1010 Upper abdominal pain, unspecified: Secondary | ICD-10-CM

## 2019-08-18 DIAGNOSIS — I208 Other forms of angina pectoris: Secondary | ICD-10-CM | POA: Diagnosis not present

## 2019-08-18 NOTE — Progress Notes (Signed)
HPI :   63 year old female who is a patient of Dr. Doyne Keel.  She presents here today with several complaints and a long story.  Reports pain on both sides/flanks like a "stitch" in her side.  Left side is more constant.  Also says that 2 weeks ago she had low chest/epigastric abdominal pain.  Ultrasound was performed and was normal so "gallbladder checked out."  Reports temp of 100.9 and chills with that as well as nausea.  Also reports constipation.  Sounds like from her description but says that when she goes a day or so without a BM then her low back aches.  She is on several medications including pantoprazole 40 mg daily and probiotics from a GI standpoint.  63 year old female with a history of Roux-en-Y gastric bypass, here for a follow-up visit.  She states back in August she had a severe episode of upper abdominal pain associate with a fever.  She stayed home and did not seek medical attention at the time, pain lasted for several hours and then went away.  She has not had severe pain since that time, however endorses episodes of milder pain in the past.  She had a CT scan done on August 27 to evaluate this.  Results as below, there was some mild wall thickening of her remnant stomach in the interim.  There were some gallstones/sludge in her gallbladder.  Otherwise a large amount of stool burden was known in her colon.  She states she does not have any abdominal pain in her upper abdomen routinely.  She does have a history of dumping syndrome from her gastric bypass which is pretty rare.  She denies any postprandial pain in her upper abdomen or right upper quadrant.  She does endorse subjectively feeling constipated which is an ongoing issue.  She has a bowel movement every day to every 3 days.  If she does not have a bowel movement every day she feels bad, with bloating and discomfort in her abdomen.  She denies any blood in her stools.  She has been using Citrucel for a few weeks and  that has helped reduce some of the bloating and distention, however she still does not have a bowel movement every day and sometimes feels incompletely evacuated.  Sometimes she has to use manual maneuvers to help get stool out from her rectum.  She has not tried MiraLAX or other laxatives.  Her last colonoscopy was in 2018 and showed one 5 mm adenoma, otherwise hemorrhoids and no other significant pathology.  Prep was adequate.  She is otherwise using Protonix 40 mg a few times a day.  She has not had any reflux symptoms or chest discomfort since being on this regimen and states she thinks she feels bit better on it.  She takes a baby aspirin for coronary artery disease, otherwise no other blood thinners.  She states she denies any other chest pains or shortness of breath is been bothering her.   EGD 01/2017: - Normal esophagus, no evidence of EoE - biopsies taken to rule out EoE. - Roux-en-Y gastrojejunostomy with gastrojejunal anastomosis characterized by healthy appearing mucosa. - A few benign appearing gastric polyps. Resected and retrieved. - Larger than expected gastric pouch. - Normal examined small bowel limb. Biopsies negative for H pylori and normal esophageal biopsies  Colonoscopy 01/2017 - one 5 mm polyp that was removed - adenoma, looping of the colon, and internal hemorrhoids.   CT scan 07/09/19 - IMPRESSION: 1. Mild  wall thickening and mucosal enhancement involving the antrum of the excluded stomach, suspicious for gastritis. 2. No evidence of recurrent or metastatic carcinoma within the abdomen or pelvis. 3. Large stool burden throughout colon; recommend clinical correlation for possible constipation. 4. Cholelithiasis or gallbladder sludge. No radiographic evidence of cholecystitis.  RUQ [place] 05/27/19 - normal     Past Medical History:  Diagnosis Date   ABDOMINAL PAIN, CHRONIC 10/20/2009   Acute cystitis 08/17/2009   ALLERGIC RHINITIS 11/21/2007   Allergy     ASYMPTOMATIC POSTMENOPAUSAL STATUS 09/29/2008   Bariatric surgery status 07/07/2010   Bariatric surgery status 07/07/2010   Qualifier: Diagnosis of  By: Loanne Drilling MD, Sean A    Cancer [facility])    uterine   Cataract    Coronary artery disease    DEPRESSION 11/21/2007   DIABETES MELLITUS, TYPE II 07/08/2007   DYSPNEA 05/20/2009   Edema 12/22/5619   Eosinophilic esophagitis 3/0/8657   FEVER UNSPECIFIED 10/20/2009   FEVER, HX OF 03/09/2010   GERD (gastroesophageal reflux disease)    Headache(784.0) 02/06/2010   Hepatomegaly 01/06/2008   HYPERLIPIDEMIA 07/08/2007   HYPERTENSION 07/08/2007   LEUKOPENIA, MILD 09/29/2008   OBESITY 01/06/2008   OSTEOARTHRITIS 01/06/2008   Other chronic nonalcoholic liver disease 8/46/9629   PERIPHERAL NEUROPATHY 11/21/2007   Peripheral neuropathy    Postsurgical hypothyroidism 09/19/2010   SINUSITIS- ACUTE-NOS 11/21/2007   TB SKIN TEST, POSITIVE 02/06/2010   THYROID NODULE 03/09/2010     Past Surgical History:  Procedure Laterality Date   ABDOMINAL HYSTERECTOMY     BASAL CELL CARCINOMA EXCISION     CARDIAC CATHETERIZATION     COLONOSCOPY     EYE SURGERY Bilateral 08/2018, 09/2018   KNEE ARTHROSCOPY Left    LEFT HEART CATH AND CORONARY ANGIOGRAPHY N/A 01/20/2019   Procedure: LEFT HEART CATH AND CORONARY ANGIOGRAPHY;  Surgeon: Belva Crome, MD;  Location: [place] [facility];  Service: Cardiovascular;  Laterality: N/A;   LYMPH NODE DISSECTION     OOPHORECTOMY     Right total knee replacement     ROUX-EN-Y GASTRIC BYPASS  2011   SKIN TAG REMOVAL     THYROIDECTOMY     TONSILLECTOMY AND ADENOIDECTOMY     UPPER GASTROINTESTINAL ENDOSCOPY     Family History  Problem Relation Age of Onset   Multiple sclerosis Sister    Breast cancer Sister        breast onset age 92   Osteoporosis Sister    Diabetes Mother    Diabetes Father    Congestive Heart Failure Father    Hypertension Father    Myelodysplastic syndrome Father      Liver cancer Maternal Aunt    Breast cancer Maternal Aunt    Diabetes Maternal Aunt    Colon cancer Maternal Uncle    Diabetes Maternal Uncle    Diabetes Maternal Grandmother    Diabetes Paternal Grandmother    Social History   Tobacco Use   Smoking status: Former Smoker    Packs/day: 1.50    Years: 15.00    Pack years: 22.50    Types: Cigarettes   Smokeless tobacco: Never Used  Substance Use Topics   Alcohol use: Yes    Alcohol/week: 0.0 standard drinks    Comment: rarely   Drug use: Never   Current Outpatient Medications  Medication Sig Dispense Refill   aspirin EC 81 MG tablet Take 1 tablet (81 mg total) by mouth daily. Starting 01/18/19 prior to cardiac cath  Biotin-D POWD Take 1 tablet by mouth daily.     calcium citrate-vitamin D (CITRACAL+D) 315-200 MG-UNIT tablet Take 1 tablet by mouth daily.     canagliflozin (INVOKANA) 100 MG TABS tablet Take 1 tablet (100 mg total) by mouth daily before breakfast. (Patient taking differently: Take 150 mg by mouth daily before breakfast. ) 90 tablet 3   Cholecalciferol (VITAMIN D3) 125 MCG (5000 UT) CAPS Take 5,000 Units by mouth daily.     Colchicine (MITIGARE) 0.6 MG CAPS Take 1 capsule by mouth daily. 90 capsule 1   diclofenac sodium (VOLTAREN) 1 % GEL Apply 4 g topically 4 (four) times daily. 100 g 11   docusate sodium (COLACE) 100 MG capsule Take 200 mg by mouth 2 (two) times daily.     furosemide (LASIX) 20 MG tablet Take 1 tablet (20 mg total) by mouth daily. (Patient taking differently: Take 20 mg by mouth as needed. ) 90 tablet 3   gabapentin (NEURONTIN) 300 MG capsule Take 2 capsules (600 mg total) by mouth 2 (two) times daily. (Patient taking differently: Take 900 mg by mouth 3 (three) times daily. ) 360 capsule 2   glucose blood test strip 1 each by Other route 3 (three) times daily. Use to monitor glucose levels TID; E11.40 100 each 2   halobetasol (ULTRAVATE) 0.05 % cream Apply 1 application  topically 2 (two) times daily as needed (lichen sclerosus).   2   HYDROcodone-acetaminophen (NORCO/VICODIN) 5-325 MG tablet Take 1 tablet by mouth daily as needed for moderate pain or severe pain.     levocetirizine (XYZAL ALLERGY 24HR) 5 MG tablet Take 1 tablet by mouth daily.     levothyroxine (SYNTHROID, LEVOTHROID) 112 MCG tablet Take 112 mcg by mouth daily before breakfast.     MAGNESIUM PO Take 1 tablet by mouth daily as needed (cramps).      metFORMIN (GLUCOPHAGE-XR) 500 MG 24 hr tablet Take 1 tablet (500 mg total) by mouth 2 (two) times daily. 180 tablet 3   Multiple Vitamin (MULTIVITAMIN) tablet Take 1 tablet by mouth daily.      nitroGLYCERIN (NITROSTAT) 0.4 MG SL tablet Place 1 tablet (0.4 mg total) under the tongue every 5 (five) minutes as needed. 25 tablet 3   phenazopyridine (PYRIDIUM) 100 MG tablet Take 100 mg by mouth 3 (three) times daily as needed for pain.     Probiotic CAPS Take 1 capsule by mouth daily.     repaglinide (PRANDIN) 2 MG tablet TAKE 1 TABLET BY MOUTH 3 TIMES A DAY BEFORE MEALS (Patient taking differently: 2 (two) times daily. ) 270 tablet 1   topiramate (TOPAMAX) 50 MG tablet Take 2 tablets (100 mg total) by mouth 2 (two) times daily. 120 tablet 3   traMADol (ULTRAM) 50 MG tablet Take 50 mg by mouth 2 (two) times daily as needed for moderate pain.     TRULICITY 1.5 MH/6.8GS SOPN INJECT 1.5 MG INTO THE SKIN ONCE A WEEK. (Patient taking differently: Inject 1.5 mg as directed every Monday. ) 6 pen 11   TURMERIC CURCUMIN PO Take 1 capsule by mouth daily.     vitamin B-12 (CYANOCOBALAMIN) 1000 MCG tablet Take 3,000-4,000 mcg by mouth daily.     atorvastatin (LIPITOR) 40 MG tablet Take 1 tablet (40 mg total) by mouth daily. (Patient taking differently: Take 40 mg by mouth at bedtime. ) 90 tablet 3   pantoprazole (PROTONIX) 40 MG tablet Take 1 tablet (40 mg total) by mouth 2 (two) times daily.  60 tablet 3   No current facility-administered medications  for this visit.    Allergies  Allergen Reactions   Ace Inhibitors Cough   Caffeine     PVCs   Ciprofloxacin Itching   Morphine And Related Itching    Pt tolerates medication if given with diphenhydramine   Mucinex [Guaifenesin Er]     PVCs   Nsaids     Gastric Bypass Surgery - unable to take, tolerates ec 81 aspirin    Other     Antihistamine-alkylamine - PVCs tolerates benadryl      Review of Systems: All systems reviewed and negative except where noted in HPI.    Lab Results  Component Value Date   WBC 4.3 01/09/2019   HGB 13.8 01/09/2019   HCT 41.4 01/09/2019   MCV 94 01/09/2019   PLT 180 01/09/2019    Lab Results  Component Value Date   CREATININE 1.07 (H) 07/06/2019   BUN 22 07/06/2019   NA 144 07/06/2019   K 4.2 07/06/2019   CL 106 07/06/2019   CO2 22 07/06/2019    Lab Results  Component Value Date   ALT 23 03/21/2018   AST 19 03/21/2018   ALKPHOS 79 01/11/2017   BILITOT 0.4 03/21/2018     Physical Exam: BP 120/70    Pulse 80    Temp 97.7 F (36.5 C)    Ht 5' 11"  (1.803 m)    Wt 153 lb (69.4 kg)    BMI 21.34 kg/m  Constitutional: Pleasant,well-developed, female in no acute distress. HEENT: Normocephalic and atraumatic. Conjunctivae are normal. No scleral icterus. Neck supple.  Cardiovascular: Normal rate, regular rhythm.  Pulmonary/chest: Effort normal and breath sounds normal. No wheezing, rales or rhonchi. Abdominal: Soft, nondistended, nontender.  There are no masses palpable. No hepatomegaly. DRE - June McMurray [place] standby - slightly low tone, normal squeeze, ? abnormal decent  Extremities: no edema Lymphadenopathy: No cervical adenopathy noted. Neurological: Alert and oriented to person place and time. Skin: Skin is warm and dry. No rashes noted. Psychiatric: Normal mood and affect. Behavior is normal.   ASSESSMENT AND PLAN: 63 year old female here for reassessment of the following:  Chronic constipation - using Citrucel has  provided some benefit however still not having regular bowel movements and feeling completely evacuated.  Based on her DRE it is possible she has a component of pelvic floor dysfunction, that being said she has not had a good trial of regularly dosed laxatives.  Recommend we stop the Citrucel now and transition to MiraLAX once to twice every day with goal for a bowel movement every day and see how she does.  If she does not have regular bowel movements or has continued straining spite adequate MiraLAX dosing, we may refer her to pelvic floor PT for a trial of biofeedback and see if that helps.  I reassured her that the recent colonoscopy looked okay without any concerning lesions.  She agreed with the plan, she will follow-up as needed for this issue.  Asked her to touch base with me in a few weeks if she is not feeling better.  Abdominal pain - prior severe episode of pain in August for which she did not seek evaluation at the time.  Previously had intermittent upper abdominal pains however does not seem postprandial when she gets it.  She is not had pain for a few months now on otherwise doing okay.  Her CT scan shows some mild thickening of her remnant antrum however she  is on PPI and reflux seems controlled.  She does have some gallstones/sludge in her gallbladder, again does not have any baseline postprandial pain.  If she has recurrence of severe pain I asked her to contact me and will need labs and evaluation at that time to help sort out what is going on.  I would hold off on referring her to a surgeon for biliary colic given she is been doing better lately.  If there is a question regarding her gallbladder in relation to her symptoms over time would recommend a HIDA scan.  [place] Cellar, MD [facility]

## 2019-08-18 NOTE — Patient Instructions (Signed)
Switch from Citrucel to Miralax daily, titrate as needed.   Consider pelvic floor therapy.   Call back when you are having pain and we will send send you to have lab work.

## 2019-08-24 DIAGNOSIS — M722 Plantar fascial fibromatosis: Secondary | ICD-10-CM | POA: Diagnosis not present

## 2019-08-28 DIAGNOSIS — M67372 Transient synovitis, left ankle and foot: Secondary | ICD-10-CM | POA: Diagnosis not present

## 2019-08-28 DIAGNOSIS — M722 Plantar fascial fibromatosis: Secondary | ICD-10-CM | POA: Diagnosis not present

## 2019-08-29 ENCOUNTER — Other Ambulatory Visit: Payer: Self-pay | Admitting: Physician Assistant

## 2019-08-29 DIAGNOSIS — Z79899 Other long term (current) drug therapy: Secondary | ICD-10-CM

## 2019-08-31 NOTE — Telephone Encounter (Addendum)
Last Visit: 08/12/19 Next visit: 02/10/20 Labs: 05/21/19 BMP WNL  Patient to update labs tomorrow  Okay to refill 30 day supply per Dr. Estanislado Pandy

## 2019-09-02 ENCOUNTER — Ambulatory Visit: Payer: Medicare Other

## 2019-09-02 ENCOUNTER — Encounter: Payer: Self-pay | Admitting: Podiatry

## 2019-09-02 ENCOUNTER — Other Ambulatory Visit: Payer: Self-pay

## 2019-09-02 ENCOUNTER — Ambulatory Visit (INDEPENDENT_AMBULATORY_CARE_PROVIDER_SITE_OTHER): Payer: Medicare Other | Admitting: Podiatry

## 2019-09-02 VITALS — BP 134/85 | HR 60 | Resp 16

## 2019-09-02 DIAGNOSIS — M722 Plantar fascial fibromatosis: Secondary | ICD-10-CM

## 2019-09-02 DIAGNOSIS — G629 Polyneuropathy, unspecified: Secondary | ICD-10-CM | POA: Insufficient documentation

## 2019-09-02 DIAGNOSIS — M7732 Calcaneal spur, left foot: Secondary | ICD-10-CM | POA: Diagnosis not present

## 2019-09-02 DIAGNOSIS — N9089 Other specified noninflammatory disorders of vulva and perineum: Secondary | ICD-10-CM | POA: Insufficient documentation

## 2019-09-02 DIAGNOSIS — N920 Excessive and frequent menstruation with regular cycle: Secondary | ICD-10-CM | POA: Insufficient documentation

## 2019-09-02 DIAGNOSIS — M7661 Achilles tendinitis, right leg: Secondary | ICD-10-CM

## 2019-09-02 DIAGNOSIS — N63 Unspecified lump in unspecified breast: Secondary | ICD-10-CM | POA: Insufficient documentation

## 2019-09-02 NOTE — Patient Instructions (Signed)
Pre-Operative Instructions  Congratulations, you have decided to take an important step towards improving your quality of life.  You can be assured that the doctors and staff at [facility] will be with you every step of the way.  Here are some important things you should know:  1. Plan to be at the surgery center/hospital at least 1 (one) hour prior to your scheduled time, unless otherwise directed by the surgical center/hospital staff.  You must have a responsible adult accompany you, remain during the surgery and drive you home.  Make sure you have directions to the surgical center/hospital to ensure you arrive on time. 2. If you are having surgery at [facility] or [facility] hospitals, you will need a copy of your medical history and physical form from your family physician within one month prior to the date of surgery. We will give you a form for your primary physician to complete.  3. We make every effort to accommodate the date you request for surgery.  However, there are times where surgery dates or times have to be moved.  We will contact you as soon as possible if a change in schedule is required.   4. No aspirin/ibuprofen for one week before surgery.  If you are on aspirin, any non-steroidal anti-inflammatory medications (Mobic, Aleve, Ibuprofen) should not be taken seven (7) days prior to your surgery.  You make take Tylenol for pain prior to surgery.  5. Medications - If you are taking daily heart and blood pressure medications, seizure, reflux, allergy, asthma, anxiety, pain or diabetes medications, make sure you notify the surgery center/hospital before the day of surgery so they can tell you which medications you should take or avoid the day of surgery. 6. No food or drink after midnight the night before surgery unless directed otherwise by surgical center/hospital staff. 7. No alcoholic beverages 24-hours prior to surgery.  No smoking 24-hours prior or 24-hours after  surgery. 8. Wear loose pants or shorts. They should be loose enough to fit over bandages, boots, and casts. 9. Don't wear slip-on shoes. Sneakers are preferred. 10. Bring your boot with you to the surgery center/hospital.  Also bring crutches or a walker if your physician has prescribed it for you.  If you do not have this equipment, it will be provided for you after surgery. 11. If you have not been contacted by the surgery center/hospital by the day before your surgery, call to confirm the date and time of your surgery. 12. Leave-time from work may vary depending on the type of surgery you have.  Appropriate arrangements should be made prior to surgery with your employer. 13. Prescriptions will be provided immediately following surgery by your doctor.  Fill these as soon as possible after surgery and take the medication as directed. Pain medications will not be refilled on weekends and must be approved by the doctor. 14. Remove nail polish on the operative foot and avoid getting pedicures prior to surgery. 15. Wash the night before surgery.  The night before surgery wash the foot and leg well with water and the antibacterial soap provided. Be sure to pay special attention to beneath the toenails and in between the toes.  Wash for at least three (3) minutes. Rinse thoroughly with water and dry well with a towel.  Perform this wash unless told not to do so by your physician.  Enclosed: 1 Ice pack (please put in freezer the night before surgery)   1 Hibiclens skin cleaner     Pre-op instructions  If you have any questions regarding the instructions, please do not hesitate to call our office.  [place]: [address], [place], [place] [address] -- [phone]  [place]: [address] [place], [place] [address] -- [phone]  [place]: [address]  [place],  [address] -- [phone]   Website: [email or web address] 

## 2019-09-04 ENCOUNTER — Other Ambulatory Visit: Payer: Self-pay | Admitting: Internal Medicine

## 2019-09-06 NOTE — Progress Notes (Signed)
   Subjective: 63 y.o. female with PMHx of T2DM presenting today as a new patient, referred by Dr. Gershon Mussel, with a chief complaint of intermittent aching pain to the lateral left foot and plantar heel that began about one year ago. She reports associated swelling of the foot. Walking excessively makes the pain worse. She has been getting injections for treatment and is currently using a CAM boot for the past two months. Patient is here for further evaluation and treatment.    Past Medical History:  Diagnosis Date  . ABDOMINAL PAIN, CHRONIC 10/20/2009  . Acute cystitis 08/17/2009  . ALLERGIC RHINITIS 11/21/2007  . Allergy   . ASYMPTOMATIC POSTMENOPAUSAL STATUS 09/29/2008  . Bariatric surgery status 07/07/2010  . Bariatric surgery status 07/07/2010   Qualifier: Diagnosis of  By: Loanne Drilling MD, Jacelyn Pi   . Cancer ([place])    uterine  . Cataract   . Coronary artery disease   . DEPRESSION 11/21/2007  . DIABETES MELLITUS, TYPE II 07/08/2007  . DYSPNEA 05/20/2009  . Edema 05/20/2009  . Eosinophilic esophagitis 12/21/9240  . FEVER UNSPECIFIED 10/20/2009  . FEVER, HX OF 03/09/2010  . GERD (gastroesophageal reflux disease)   . Headache(784.0) 02/06/2010  . Hepatomegaly 01/06/2008  . HYPERLIPIDEMIA 07/08/2007  . HYPERTENSION 07/08/2007  . LEUKOPENIA, MILD 09/29/2008  . OBESITY 01/06/2008  . OSTEOARTHRITIS 01/06/2008  . Other chronic nonalcoholic liver disease 6/83/4196  . PERIPHERAL NEUROPATHY 11/21/2007  . Peripheral neuropathy   . Postsurgical hypothyroidism 09/19/2010  . SINUSITIS- ACUTE-NOS 11/21/2007  . TB SKIN TEST, POSITIVE 02/06/2010  . THYROID NODULE 03/09/2010     Objective: Physical Exam General: The patient is alert and oriented x3 in no acute distress.  Dermatology: Skin is warm, dry and supple bilateral lower extremities. Negative for open lesions or macerations bilateral.   Vascular: Dorsalis Pedis and Posterior Tibial pulses palpable bilateral.  Capillary fill time is immediate to all digits.   Neurological: Epicritic and protective threshold intact bilateral.   Musculoskeletal: Tenderness to palpation to the plantar aspect of the left heel along the plantar fascia. All other joints range of motion within normal limits bilateral. Strength 5/5 in all groups bilateral.    Assessment: 1. Chronic plantar fasciitis left foot 2. Plantar heel spur left   Plan of Care:  1. Patient evaluated. Xrays in Epic from 08/12/2019 reviewed.   2. Injection of 0.5cc Celestone soluspan injected into the left plantar fascia.  2. Today we discussed the conservative versus surgical management of the presenting pathology. The patient opts for surgical management. All possible complications and details of the procedure were explained. All patient questions were answered. No guarantees were expressed or implied. 3. Authorization for surgery was initiated today. Surgery will consist of plantar heel spur resection left; open plantar fasciotomy left.  4. Will need cardiac clearance prior to surgery.  5. Return to clinic one week post op.      Edrick Kins, DPM [facility]  Dr. Edrick Kins, DPM    [address], [place] [address]                Office [phone]  Fax [phone]

## 2019-09-09 ENCOUNTER — Other Ambulatory Visit: Payer: Self-pay | Admitting: Internal Medicine

## 2019-09-09 MED ORDER — ATORVASTATIN CALCIUM 40 MG PO TABS: 40.0000 mg | ORAL_TABLET | Freq: Every day | ORAL | 0 refills | Status: DC

## 2019-09-16 ENCOUNTER — Other Ambulatory Visit: Payer: Self-pay | Admitting: Family Medicine

## 2019-09-17 DIAGNOSIS — Z79899 Other long term (current) drug therapy: Secondary | ICD-10-CM | POA: Diagnosis not present

## 2019-09-17 DIAGNOSIS — M545 Low back pain: Secondary | ICD-10-CM | POA: Diagnosis not present

## 2019-09-17 DIAGNOSIS — M79672 Pain in left foot: Secondary | ICD-10-CM | POA: Diagnosis not present

## 2019-09-17 DIAGNOSIS — R202 Paresthesia of skin: Secondary | ICD-10-CM | POA: Diagnosis not present

## 2019-09-17 DIAGNOSIS — M79671 Pain in right foot: Secondary | ICD-10-CM | POA: Diagnosis not present

## 2019-09-17 DIAGNOSIS — R5383 Other fatigue: Secondary | ICD-10-CM | POA: Diagnosis not present

## 2019-09-17 DIAGNOSIS — R270 Ataxia, unspecified: Secondary | ICD-10-CM | POA: Diagnosis not present

## 2019-09-29 ENCOUNTER — Encounter: Payer: Self-pay | Admitting: *Deleted

## 2019-09-29 NOTE — Progress Notes (Signed)
Per Dr. Amalia Hailey, I sent a surgical medical clearance request letter to Dr. Margaretann Loveless.  Savannah [name] is scheduled for surgery on 10/22/2019.

## 2019-09-30 ENCOUNTER — Telehealth: Payer: Self-pay | Admitting: *Deleted

## 2019-09-30 NOTE — Telephone Encounter (Signed)
-----   Message from Elouise Munroe, MD sent at 09/30/2019 12:17 PM EST ----- Regarding: FW: Other Can you double book me at 1:20 pm for Ms. Bangerter on Dec 7? She needs surgical clearance. If she is unable to come that day, please set her up in pre-op clearance clinic with one of the APPs any day before her surgical date 10/22/2019.   Thanks, GA ----- Message ----- From: Lolita Rieger Sent: 09/29/2019   7:05 PM EST To: Elouise Munroe, MD Subject: Other

## 2019-09-30 NOTE — Telephone Encounter (Signed)
Called patient - offered appointment for dec 7 , 2020 Patient prefer another day  Not so close to surgical date.patient prefer with Dr Margaretann Loveless an not aa  APP.   patient aware will contact tomorrow to see  Finalize date

## 2019-09-30 NOTE — Progress Notes (Signed)
Will forward to Preop pool per Dr Oval Linsey

## 2019-10-03 NOTE — Telephone Encounter (Signed)
10/19/19 gives [place] plenty of time before her surgery, and I am comfortable with that date to get a letter to her doctor. However, if she would much prefer, 11/25 will be fine, but will need to before 10 am since it's a quarter day/echo day.  Thanks, GA

## 2019-10-05 ENCOUNTER — Other Ambulatory Visit: Payer: Self-pay

## 2019-10-05 DIAGNOSIS — Z20828 Contact with and (suspected) exposure to other viral communicable diseases: Secondary | ICD-10-CM | POA: Diagnosis not present

## 2019-10-05 DIAGNOSIS — Z20822 Contact with and (suspected) exposure to covid-19: Secondary | ICD-10-CM

## 2019-10-05 NOTE — Telephone Encounter (Signed)
Patient is aware an appointment schedule for 10/07/19

## 2019-10-07 ENCOUNTER — Ambulatory Visit (INDEPENDENT_AMBULATORY_CARE_PROVIDER_SITE_OTHER): Payer: Medicare Other | Admitting: Internal Medicine

## 2019-10-07 ENCOUNTER — Encounter: Payer: Self-pay | Admitting: Internal Medicine

## 2019-10-07 ENCOUNTER — Other Ambulatory Visit: Payer: Self-pay

## 2019-10-07 VITALS — BP 124/78 | HR 65 | Temp 97.9°F | Ht 70.5 in | Wt 165.0 lb

## 2019-10-07 DIAGNOSIS — R29898 Other symptoms and signs involving the musculoskeletal system: Secondary | ICD-10-CM

## 2019-10-07 DIAGNOSIS — Z01818 Encounter for other preprocedural examination: Secondary | ICD-10-CM | POA: Diagnosis not present

## 2019-10-07 DIAGNOSIS — I2510 Atherosclerotic heart disease of native coronary artery without angina pectoris: Secondary | ICD-10-CM

## 2019-10-07 LAB — NOVEL CORONAVIRUS, NAA: SARS-CoV-2, NAA: NOT DETECTED

## 2019-10-07 NOTE — Patient Instructions (Addendum)
Medication Instructions:  NO CHANGES    *If you need a refill on your cardiac medications before your next appointment, please call your pharmacy*  Lab Work: NOT NEEDED   Testing/Procedures: WILL BE SCHEDULE AT [place] [address] Your physician has requested that you have a lower  extremity arterial duplex. This test is an ultrasound of the arteries in the legs. It looks at arterial blood flow in the legs . Allow one hour for Lower  Arterial scans. There are no restrictions or special instructions  Follow-Up: At [facility], you and your health needs are our priority.  As part of our continuing mission to provide you with exceptional heart care, we have created designated Provider Care Teams.  These Care Teams include your primary Cardiologist (physician) and Advanced Practice Providers (APPs -  Physician Assistants and Nurse Practitioners) who all work together to provide you with the care you need, when you need it.  Your next appointment:   3 month(s)  The format for your next appointment:   In Person  Provider:   Cherlynn Kaiser, MD  Other Instructions  [place]

## 2019-10-08 NOTE — Progress Notes (Signed)
Cardiology Office Note:    Date:  10/07/2019   ID:  Savannah [name], DOB 03-11-1956, MRN [id number]  PCP:  Vivi Barrack, MD  Cardiologist:  Elouise Munroe, MD  Electrophysiologist:  None   Referring MD: Vivi Barrack, MD   Chief Complaint: preoperative cardiovascular evaluation  History of Present Illness:    Savannah [name] is a 63 y.o. female with a hx of chest pain on exertion as well as atypical chest pain. Her past medical history is significant for PVCs, esophageal rings with multiple previous dilations, bariatric surgery in 2011 with appropriate postoperative weight loss. She presents today for cardiovascular risk optimization prior to Endoscopic Plantar Fasciotomy and a Heel Spur Resection on the left foot.  She overall feels well from a cardiopulmonary standpoint. The patient denies chest pain, chest pressure, dyspnea at rest or with exertion, palpitations, PND, orthopnea. Denies syncope or presyncope. Denies dizziness or lightheadedness.   She endorses bilateral leg weakness and wonders if this is related to statin use. She has had leg weakness for many years, and it did not change when her statin was intensified upon recognition of coronary artery disease during our initial encounters last year. She describes this as calf weakness with exercise. She denies a cramping sensation. She feels that her legs are weak despite strength training in previous years, and lately has been on a walking program where she feels she cannot perform to her maximum due to leg fatigue. She wonders if this is peripheral arterial disease.   We discussed that it is unlikely that her symptoms are due to statin intolerance however we can trial cessation after surgery. We discussed that I am not keen to stop her statin just prior to general anesthesia. She also inquires about starting a beta blocker in the peri-operative period and we discussed the relevant literature regarding this discussion.    Preoperative cardiovascular evaluation Pertinent past cardiac history: Severe three vessel CAD Prior cardiac workup: stress echo and LHC History of valve disease: no significant  History of CAD/PAD/CVA/TIA: CAD History of heart failure: none in last 6 months, no significant history.  History of arrhythmia: no On anticoagulation: antiplatelet- ASA 81 mg  Additional history (hypertension, diabetes/on insulin, CKD/current creatinine, OSA, anesthesia complications): Current symptoms: none Functional capacity: >4 METS without chest pain, SOB or fatigue.    Past Medical History:  Diagnosis Date   ABDOMINAL PAIN, CHRONIC 10/20/2009   Acute cystitis 08/17/2009   ALLERGIC RHINITIS 11/21/2007   Allergy    ASYMPTOMATIC POSTMENOPAUSAL STATUS 09/29/2008   Bariatric surgery status 07/07/2010   Bariatric surgery status 07/07/2010   Qualifier: Diagnosis of  By: Loanne Drilling MD, Sean A    Cancer [facility])    uterine   Cataract    Coronary artery disease    DEPRESSION 11/21/2007   DIABETES MELLITUS, TYPE II 07/08/2007   DYSPNEA 05/20/2009   Edema 02/18/7025   Eosinophilic esophagitis 01/17/8587   FEVER UNSPECIFIED 10/20/2009   FEVER, HX OF 03/09/2010   GERD (gastroesophageal reflux disease)    Headache(784.0) 02/06/2010   Hepatomegaly 01/06/2008   HYPERLIPIDEMIA 07/08/2007   HYPERTENSION 07/08/2007   LEUKOPENIA, MILD 09/29/2008   OBESITY 01/06/2008   OSTEOARTHRITIS 01/06/2008   Other chronic nonalcoholic liver disease 03/13/7740   PERIPHERAL NEUROPATHY 11/21/2007   Peripheral neuropathy    Postsurgical hypothyroidism 09/19/2010   SINUSITIS- ACUTE-NOS 11/21/2007   TB SKIN TEST, POSITIVE 02/06/2010   THYROID NODULE 03/09/2010    Past Surgical History:  Procedure Laterality Date  ABDOMINAL HYSTERECTOMY     BASAL CELL CARCINOMA EXCISION     CARDIAC CATHETERIZATION     COLONOSCOPY     EYE SURGERY Bilateral 08/2018, 09/2018   KNEE ARTHROSCOPY Left    LEFT HEART CATH AND  CORONARY ANGIOGRAPHY N/A 01/20/2019   Procedure: LEFT HEART CATH AND CORONARY ANGIOGRAPHY;  Surgeon: Belva Crome, MD;  Location: [facility];  Service: Cardiovascular;  Laterality: N/A;   LYMPH NODE DISSECTION     OOPHORECTOMY     Right total knee replacement     ROUX-EN-Y GASTRIC BYPASS  2011   SKIN TAG REMOVAL     THYROIDECTOMY     TONSILLECTOMY AND ADENOIDECTOMY     UPPER GASTROINTESTINAL ENDOSCOPY      Current Medications: Current Meds  Medication Sig   aspirin EC 81 MG tablet Take 1 tablet (81 mg total) by mouth daily. Starting 01/18/19 prior to cardiac cath   atorvastatin (LIPITOR) 40 MG tablet Take 1 tablet (40 mg total) by mouth daily. *NEEDS OFFICE VISIT FOR FURTHER REFILLS*   Biotin-D POWD Take 1 tablet by mouth daily.   canagliflozin (INVOKANA) 100 MG TABS tablet Take 1 tablet (100 mg total) by mouth daily before breakfast. (Patient taking differently: Take 150 mg by mouth daily before breakfast. )   Cholecalciferol (VITAMIN D3) 125 MCG (5000 UT) CAPS Take 5,000 Units by mouth daily.   ciclopirox (PENLAC) 8 % solution APPLY TO FINGERNAILS DAILY AS DIRECTED   Colchicine 0.6 MG CAPS TAKE 1 CAPSULE BY MOUTH EVERY DAY   diclofenac sodium (VOLTAREN) 1 % GEL Apply 4 g topically 4 (four) times daily. (Patient taking differently: Apply 4 g topically as needed. )   docusate sodium (COLACE) 100 MG capsule Take 200 mg by mouth 2 (two) times daily.   gabapentin (NEURONTIN) 300 MG capsule Take 2 capsules (600 mg total) by mouth 2 (two) times daily. (Patient taking differently: Take 900 mg by mouth 3 (three) times daily. )   glucose blood test strip 1 each by Other route 3 (three) times daily. Use to monitor glucose levels TID; E11.40   halobetasol (ULTRAVATE) 0.05 % cream Apply 1 application topically 2 (two) times daily as needed (lichen sclerosus).    HYDROcodone-acetaminophen (NORCO/VICODIN) 5-325 MG tablet Take 1 tablet by mouth daily as needed for moderate  pain or severe pain.   levocetirizine (XYZAL ALLERGY 24HR) 5 MG tablet Take 1 tablet by mouth daily.   levothyroxine (SYNTHROID, LEVOTHROID) 112 MCG tablet Take 112 mcg by mouth daily before breakfast.   MAGNESIUM PO Take 1 tablet by mouth daily as needed (cramps).    metFORMIN (GLUCOPHAGE-XR) 500 MG 24 hr tablet Take 1 tablet (500 mg total) by mouth 2 (two) times daily.   Multiple Vitamin (MULTIVITAMIN) tablet Take 1 tablet by mouth daily.    nitroGLYCERIN (NITROSTAT) 0.4 MG SL tablet Place 1 tablet (0.4 mg total) under the tongue every 5 (five) minutes as needed.   pantoprazole (PROTONIX) 40 MG tablet Take 1 tablet (40 mg total) by mouth 2 (two) times daily.   phenazopyridine (PYRIDIUM) 100 MG tablet Take 100 mg by mouth 3 (three) times daily as needed for pain.   Probiotic CAPS Take 1 capsule by mouth daily.   repaglinide (PRANDIN) 2 MG tablet TAKE 1 TABLET BY MOUTH 3 TIMES A DAY BEFORE MEALS (Patient taking differently: 2 (two) times daily. )   topiramate (TOPAMAX) 50 MG tablet TAKE 2 TABLETS BY MOUTH TWICE A DAY   traMADol (ULTRAM) 50  MG tablet Take 50 mg by mouth 2 (two) times daily as needed for moderate pain.   TRULICITY 1.5 XT/0.6YI SOPN INJECT 1.5 MG INTO THE SKIN ONCE A WEEK. (Patient taking differently: Inject 1.5 mg as directed every Monday. )   TURMERIC CURCUMIN PO Take 1 capsule by mouth daily.   vitamin B-12 (CYANOCOBALAMIN) 1000 MCG tablet Take 3,000-4,000 mcg by mouth daily.   [DISCONTINUED] nystatin ointment (MYCOSTATIN) APPLY ONCE A DAY UNDER FINGERNAILS AS DIRECTED     Allergies:   Ace inhibitors, Caffeine, Ciprofloxacin, Morphine and related, Mucinex [guaifenesin er], Nsaids, and Other   Social History   Socioeconomic History   Marital status: Single    Spouse name: Not on file   Number of children: 0   Years of education: 16   Highest education level: Bachelor's degree (e.g., BA, AB, BS)  Occupational History   Occupation: Health visitor strain: Not hard at all   Food insecurity    Worry: Never true    Inability: Never true   Transportation needs    Medical: No    Non-medical: No  Tobacco Use   Smoking status: Former Smoker    Packs/day: 1.50    Years: 15.00    Pack years: 22.50    Types: Cigarettes   Smokeless tobacco: Never Used  Substance and Sexual Activity   Alcohol use: Yes    Alcohol/week: 0.0 standard drinks    Comment: rarely   Drug use: Never   Sexual activity: Not on file  Lifestyle   Physical activity    Days per week: 5 days    Minutes per session: 30 min   Stress: Not at all  Relationships   Social connections    Talks on phone: Not on file    Gets together: Not on file    Attends religious service: Not on file    Active member of club or organization: Not on file    Attends meetings of clubs or organizations: Not on file    Relationship status: Not on file  Other Topics Concern   Not on file  Social History Narrative   Not on file     Family History: The patient's family history includes Breast cancer in her maternal aunt and sister; Colon cancer in her maternal uncle; Congestive Heart Failure in her father; Diabetes in her father, maternal aunt, maternal grandmother, maternal uncle, mother, and paternal grandmother; Hypertension in her father; Liver cancer in her maternal aunt; Multiple sclerosis in her sister; Myelodysplastic syndrome in her father; Osteoporosis in her sister.  ROS:   Please see the history of present illness.    All other systems reviewed and are negative.  EKGs/Labs/Other Studies Reviewed:    The following studies were reviewed today: LHC 21-Jan-2019:  Diffuse LAD and right coronary calcification.  Widely patent left main  Eccentric 70% proximal LAD within a calcified segment.  Distal to apical LAD is calcified and contains greater than 90% diffuse obstruction.  Circumflex coronary artery is dominant.  The second obtuse  marginal which is moderate in size contains 85% stenosis followed by 80% stenosis within a tortuous mid segment.  The circumflex is otherwise widely patent.  Right coronary is nondominant and contains proximal to mid heavy calcification with 70 to 80% stenosis.  Left ventricular function is normal.  LVEDP is normal.  RECOMMENDATIONS:   The patient has significant three-vessel coronary disease with most severe lesions involving relatively small vascular territories.  The  right coronary is essentially nondominant and contains an 80% proximal stenosis that could be treated with orbital atherectomy (downstream territory is small).  The second obtuse marginal contains mid vessel significant stenosis with and tortuosity with only a small territory of potential ischemia beyond the lesions and is best treated with medical therapy.  The distal LAD is heavily calcified and diffusely involved, also best treated with medical therapy.  The proximal LAD is eccentric and is borderline significant angiographically and could be treated with orbital atherectomy and stenting if symptoms become uncontrollable.  Aggressive secondary risk prevention including LDL of 55 or less, consider adding PCSK9 therapy.  Aggressive anti-ischemic therapy  EKG:  Sinus rhythm, 65 bpm  Recent Labs: 01/09/2019: Hemoglobin 13.8; Platelets 180; TSH 0.241 07/06/2019: BNP 69.0; BUN 22; Creatinine, Ser 1.07; Potassium 4.2; Sodium 144  Recent Lipid Panel    Component Value Date/Time   CHOL 136 04/29/2019 1336   TRIG 102 04/29/2019 1336   HDL 47 04/29/2019 1336   CHOLHDL 2.9 04/29/2019 1336   CHOLHDL 4 07/04/2018 1403   VLDL 24.6 07/04/2018 1403   LDLCALC 69 04/29/2019 1336   LDLDIRECT 99.0 10/12/2011 1219    Physical Exam:    VS:  BP 124/78 (BP Location: Left Arm, Patient Position: Sitting, Cuff Size: Normal)    Pulse 65    Temp 97.9 F (36.6 C)    Ht 5' 10.5" (1.791 m)    Wt 165 lb (74.8 kg)    BMI 23.34 kg/m     Wt  Readings from Last 5 Encounters:  10/07/19 165 lb (74.8 kg)  08/18/19 153 lb (69.4 kg)  08/12/19 149 lb (67.6 kg)  07/17/19 158 lb 1.1 oz (71.7 kg)  07/13/19 158 lb 8.2 oz (71.9 kg)     Constitutional: No acute distress Eyes: sclera non-icteric, normal conjunctiva and lids ENMT: normal dentition, moist mucous membranes Cardiovascular: regular rhythm, normal rate, no murmurs. S1 and S2 normal. Radial pulses normal bilaterally. No jugular venous distention.  Respiratory: clear to auscultation bilaterally GI : normal bowel sounds, soft and nontender. No distention.   MSK: extremities warm, well perfused. No edema.  NEURO: grossly nonfocal exam, moves all extremities. PSYCH: alert and oriented x 3, normal mood and affect.   ASSESSMENT:    1. Pre-op evaluation   2. Coronary artery disease involving native coronary artery of native heart without angina pectoris   3. Weakness of both lower extremities    PLAN:    Pre-operative evaluation - Ms. Mackel is planning to have orthopedic/podiatric surgery on her foot. This will require general anesthesia. She has known, severe CAD without intervention as the distal territories are felt to be small. We have attempted to pursue medical management however this has been difficult due to difficulty tolerating medical therapy.   Lyndel Safe risk score is 0.6% risk of MACE and RCRI is 3.9% risk of MACE.   I feel she is at least intermediate risk for general anesthesia during a low risk procedure. I have discussed the risk with the patient. If these level of risk is deemed to be acceptable by the surgical team and the patient, she should be considered optimized from a cardiovascular standpoint. No further testing is required prior to general anesthesia. Fortunately she is currently symptom free with >4 METS of activity.   Bilateral leg weakness - we will evaluate for PAD with arterial Dopplers.  This does not need to be completed preoperatively.  With known  coronary artery disease she may in fact  have peripheral arterial disease, however her symptoms are atypical for PAD.  TIME SPENT WITH PATIENT: 35 minutes of direct patient care. More than 50% of that time was spent on coordination of care and counseling regarding preoperative evaluation and assessment for PAD.  Cherlynn Kaiser, MD [place]   [facility]   Medication Adjustments/Labs and Tests Ordered: Current medicines are reviewed at length with the patient today.  Concerns regarding medicines are outlined above.  Orders Placed This Encounter  Procedures   EKG 12-Lead   LEA---- VAS [place] LOWER EXTREMITY ARTERIAL DUPLEX   No orders of the defined types were placed in this encounter.   Patient Instructions  Medication Instructions:  NO CHANGES    *If you need a refill on your cardiac medications before your next appointment, please call your pharmacy*  Lab Work: NOT NEEDED   Testing/Procedures: WILL BE SCHEDULE AT [place] [facility] Your physician has requested that you have a lower  extremity arterial duplex. This test is an ultrasound of the arteries in the legs. It looks at arterial blood flow in the legs . Allow one hour for Lower  Arterial scans. There are no restrictions or special instructions  Follow-Up: At [facility], you and your health needs are our priority.  As part of our continuing mission to provide you with exceptional heart care, we have created designated Provider Care Teams.  These Care Teams include your primary Cardiologist (physician) and Advanced Practice Providers (APPs -  Physician Assistants and Nurse Practitioners) who all work together to provide you with the care you need, when you need it.  Your next appointment:   3 month(s)  The format for your next appointment:   In Person  Provider:   Cherlynn Kaiser, MD  Other Instructions  [place]

## 2019-10-09 ENCOUNTER — Other Ambulatory Visit: Payer: Self-pay | Admitting: Rheumatology

## 2019-10-12 NOTE — Telephone Encounter (Signed)
Ok to refill 30-day supply

## 2019-10-12 NOTE — Telephone Encounter (Signed)
Last RF 09/01/2019 Last appt 08/12/2019 Next appt 02/10/2019  Labs ordered by Dr. Estanislado Pandy not drawn

## 2019-10-15 DIAGNOSIS — G5602 Carpal tunnel syndrome, left upper limb: Secondary | ICD-10-CM | POA: Diagnosis not present

## 2019-10-15 DIAGNOSIS — M79672 Pain in left foot: Secondary | ICD-10-CM | POA: Diagnosis not present

## 2019-10-15 DIAGNOSIS — M545 Low back pain: Secondary | ICD-10-CM | POA: Diagnosis not present

## 2019-10-15 DIAGNOSIS — Z79899 Other long term (current) drug therapy: Secondary | ICD-10-CM | POA: Diagnosis not present

## 2019-10-15 DIAGNOSIS — G603 Idiopathic progressive neuropathy: Secondary | ICD-10-CM | POA: Diagnosis not present

## 2019-10-19 ENCOUNTER — Ambulatory Visit: Payer: Medicare Other | Admitting: Internal Medicine

## 2019-10-22 ENCOUNTER — Other Ambulatory Visit: Payer: Self-pay | Admitting: Podiatry

## 2019-10-22 DIAGNOSIS — M7732 Calcaneal spur, left foot: Secondary | ICD-10-CM | POA: Diagnosis not present

## 2019-10-22 DIAGNOSIS — M722 Plantar fascial fibromatosis: Secondary | ICD-10-CM | POA: Diagnosis not present

## 2019-10-22 MED ORDER — OXYCODONE-ACETAMINOPHEN 5-325 MG PO TABS: 1.0000 | ORAL_TABLET | Freq: Four times a day (QID) | ORAL | 0 refills | Status: DC | PRN

## 2019-10-22 NOTE — Progress Notes (Signed)
PRN postop 

## 2019-10-23 ENCOUNTER — Telehealth: Payer: Self-pay | Admitting: *Deleted

## 2019-10-23 NOTE — Telephone Encounter (Signed)
Patient called and left a message and I called the patient back and patient stated that the bandage was soaked with blood and was about 4 inches long and 2 inches wide and was soaking the boot as well and I stated that we could see the patient tomorrow morning with Dr Amalia Hailey. Lattie Haw

## 2019-10-24 ENCOUNTER — Other Ambulatory Visit: Payer: Self-pay

## 2019-10-24 ENCOUNTER — Ambulatory Visit (INDEPENDENT_AMBULATORY_CARE_PROVIDER_SITE_OTHER): Payer: Medicare Other | Admitting: Podiatry

## 2019-10-24 ENCOUNTER — Encounter: Payer: Self-pay | Admitting: Podiatry

## 2019-10-24 VITALS — BP 124/73 | HR 81 | Temp 97.5°F | Resp 16

## 2019-10-24 DIAGNOSIS — M7732 Calcaneal spur, left foot: Secondary | ICD-10-CM

## 2019-10-24 DIAGNOSIS — Z9889 Other specified postprocedural states: Secondary | ICD-10-CM

## 2019-10-24 DIAGNOSIS — M722 Plantar fascial fibromatosis: Secondary | ICD-10-CM

## 2019-10-26 ENCOUNTER — Ambulatory Visit (INDEPENDENT_AMBULATORY_CARE_PROVIDER_SITE_OTHER): Payer: Self-pay | Admitting: Podiatry

## 2019-10-26 ENCOUNTER — Ambulatory Visit (INDEPENDENT_AMBULATORY_CARE_PROVIDER_SITE_OTHER): Payer: Medicare Other

## 2019-10-26 ENCOUNTER — Encounter: Payer: Medicare Other | Admitting: Podiatry

## 2019-10-26 ENCOUNTER — Telehealth: Payer: Self-pay | Admitting: *Deleted

## 2019-10-26 ENCOUNTER — Other Ambulatory Visit: Payer: Self-pay | Admitting: Podiatry

## 2019-10-26 ENCOUNTER — Other Ambulatory Visit: Payer: Self-pay

## 2019-10-26 DIAGNOSIS — M79672 Pain in left foot: Secondary | ICD-10-CM

## 2019-10-26 DIAGNOSIS — Z9889 Other specified postprocedural states: Secondary | ICD-10-CM

## 2019-10-26 DIAGNOSIS — S92002A Unspecified fracture of left calcaneus, initial encounter for closed fracture: Secondary | ICD-10-CM

## 2019-10-26 DIAGNOSIS — M7732 Calcaneal spur, left foot: Secondary | ICD-10-CM

## 2019-10-26 DIAGNOSIS — M722 Plantar fascial fibromatosis: Secondary | ICD-10-CM

## 2019-10-26 NOTE — Telephone Encounter (Deleted)
-----   Message from Edrick Kins, DPM sent at 10/26/2019 12:02 PM EST ----- Regarding: Knee scooter Patient needs a knee scooter.   Dx: calcaneal fracture left  Non-weight bearing x 8 weeks.   Thanks, Dr. Amalia Hailey

## 2019-10-27 NOTE — Telephone Encounter (Signed)
-----   Message from Edrick Kins, DPM sent at 10/26/2019 12:02 PM EST ----- Regarding: Knee scooter Patient needs a knee scooter.   Dx: calcaneal fracture left  Non-weight bearing x 8 weeks.   Thanks, Dr. Amalia Hailey

## 2019-10-27 NOTE — Telephone Encounter (Signed)
Faxed orders, clinical and demographics to [employer].

## 2019-10-27 NOTE — Progress Notes (Signed)
HPI: 63 y.o. female presenting today status post plantar fasciectomy with excision of plantar heel spur left foot.  Patient states that over the weekend she stepped down on her foot without the boot and applied pressure to her heel.  This was while she was changing her clothes.  When this occurred she heard a loud crack and significant pain to the surgical foot.  She did not notice increased bleeding to the foot or the dressings.  She presents today wearing her cam boot and using a walker for ambulation.  Past Medical History:  Diagnosis Date  . ABDOMINAL PAIN, CHRONIC 10/20/2009  . Acute cystitis 08/17/2009  . ALLERGIC RHINITIS 11/21/2007  . Allergy   . ASYMPTOMATIC POSTMENOPAUSAL STATUS 09/29/2008  . Bariatric surgery status 07/07/2010  . Bariatric surgery status 07/07/2010   Qualifier: Diagnosis of  By: Loanne Drilling MD, Jacelyn Pi   . Cancer ([place])    uterine  . Cataract   . Coronary artery disease   . DEPRESSION 11/21/2007  . DIABETES MELLITUS, TYPE II 07/08/2007  . DYSPNEA 05/20/2009  . Edema 05/20/2009  . Eosinophilic esophagitis 03/15/6502  . FEVER UNSPECIFIED 10/20/2009  . FEVER, HX OF 03/09/2010  . GERD (gastroesophageal reflux disease)   . Headache(784.0) 02/06/2010  . Hepatomegaly 01/06/2008  . HYPERLIPIDEMIA 07/08/2007  . HYPERTENSION 07/08/2007  . LEUKOPENIA, MILD 09/29/2008  . OBESITY 01/06/2008  . OSTEOARTHRITIS 01/06/2008  . Other chronic nonalcoholic liver disease 5/46/5681  . PERIPHERAL NEUROPATHY 11/21/2007  . Peripheral neuropathy   . Postsurgical hypothyroidism 09/19/2010  . SINUSITIS- ACUTE-NOS 11/21/2007  . TB SKIN TEST, POSITIVE 02/06/2010  . THYROID NODULE 03/09/2010     Physical Exam: General: The patient is alert and oriented x3 in no acute distress.  Dermatology: Skin incision to the lateral aspect of the heel is intact with staples intact.  There is some mild maceration noted around the incision site.  Vascular: Palpable pedal pulses bilaterally.  Moderate edema noted to the  heel with some slight ecchymosis. Capillary refill within normal limits.  Neurological: Epicritic and protective threshold grossly intact bilaterally.   Musculoskeletal Exam: Status post plantar heel spur exostectomy with partial plantar fasciectomy.  Radiographic Exam:  X-rays today demonstrate an intra-articular fracture of the body of the calcaneus to the surgical foot.  Fracture extends into the subtalar joint, however at the joint it appears nondisplaced.  There is displacement of the plantar portion of the calcaneal tubercle.  Assessment: 1.  Status post plantar heel exostectomy with partial plantar fasciectomy left 2.  Calcaneal fracture left; see above description and radiographic exam   Plan of Care:  1. Patient evaluated. X-Rays reviewed.  2.  Today the patient needs to be strictly nonweightbearing to the surgical extremity.  We will continue conservative nonsurgical management of the calcaneal fracture.  3.  Continue strict nonweightbearing in the cam boot using the walker 4.  Order placed today for knee scooter.  Strict nonweightbearing 6-8 weeks 5.  Return to clinic in 1 week      Edrick Kins, DPM [facility]  Dr. Edrick Kins, DPM    [address], [place] [address]                Office [phone]  Fax [phone]

## 2019-10-27 NOTE — Telephone Encounter (Signed)
Note dictated. - Dr.Canyon Willow

## 2019-10-28 ENCOUNTER — Telehealth: Payer: Self-pay | Admitting: *Deleted

## 2019-10-28 NOTE — Telephone Encounter (Signed)
Pt called for the status of the knee scooter, is using a walker but can not be completely off the foot.

## 2019-10-28 NOTE — Progress Notes (Signed)
   Subjective:  Patient presents today status post plantar heel spur resection left. DOS: 10/22/2019. She states she is doing well. She is concerned because she bled through her bandage. She denies any significant pain or modifying factors. She has been using the CAM boot as directed but states she has also bled onto that as well. Patient is here for further evaluation and treatment.    Past Medical History:  Diagnosis Date  . ABDOMINAL PAIN, CHRONIC 10/20/2009  . Acute cystitis 08/17/2009  . ALLERGIC RHINITIS 11/21/2007  . Allergy   . ASYMPTOMATIC POSTMENOPAUSAL STATUS 09/29/2008  . Bariatric surgery status 07/07/2010  . Bariatric surgery status 07/07/2010   Qualifier: Diagnosis of  By: Loanne Drilling MD, Jacelyn Pi   . Cancer ([place])    uterine  . Cataract   . Coronary artery disease   . DEPRESSION 11/21/2007  . DIABETES MELLITUS, TYPE II 07/08/2007  . DYSPNEA 05/20/2009  . Edema 05/20/2009  . Eosinophilic esophagitis 05/18/1164  . FEVER UNSPECIFIED 10/20/2009  . FEVER, HX OF 03/09/2010  . GERD (gastroesophageal reflux disease)   . Headache(784.0) 02/06/2010  . Hepatomegaly 01/06/2008  . HYPERLIPIDEMIA 07/08/2007  . HYPERTENSION 07/08/2007  . LEUKOPENIA, MILD 09/29/2008  . OBESITY 01/06/2008  . OSTEOARTHRITIS 01/06/2008  . Other chronic nonalcoholic liver disease 7/90/3833  . PERIPHERAL NEUROPATHY 11/21/2007  . Peripheral neuropathy   . Postsurgical hypothyroidism 09/19/2010  . SINUSITIS- ACUTE-NOS 11/21/2007  . TB SKIN TEST, POSITIVE 02/06/2010  . THYROID NODULE 03/09/2010      Objective/Physical Exam Neurovascular status intact.  Skin incisions appear to be well coapted with sutures and staples intact. No sign of infectious process noted. No dehiscence. No active bleeding noted. Moderate edema noted to the surgical extremity.   Assessment: 1. s/p plantar heel spur resection left. DOS: 10/22/2019   Plan of Care:  1. Patient was evaluated.  2. Dressing changed.  3. New CAM boot dispensed. Weightbearing  as tolerated.  4. Return to clinic in one week.    Edrick Kins, DPM [facility]  Dr. Edrick Kins, [place]                                        [place], [place] [address]                Office [phone]  Fax [phone]

## 2019-10-29 NOTE — Telephone Encounter (Signed)
I don't have any other advice to give. Thanks, Dr. Amalia Hailey

## 2019-10-29 NOTE — Telephone Encounter (Signed)
Pt is following up on knee scooter and has an migraine aura continuous since surgery with decreased vision and nausea, this happened with her last surgery at this surgical center. Community Message to A. [place] - [employer] concerning knee scooter that Dr. Amalia Hailey signed orders.

## 2019-10-29 NOTE — Telephone Encounter (Signed)
I called [place] states [facility] [phone]. I called pt and informed [employer] would call with the knee scooter information. Pt states [employer] called and it is $89.00/month and she can buy one on [address] and have in-home tomorrow. I gave pt [facility] [phone].

## 2019-11-02 ENCOUNTER — Ambulatory Visit (INDEPENDENT_AMBULATORY_CARE_PROVIDER_SITE_OTHER): Payer: Medicare Other | Admitting: Podiatry

## 2019-11-02 ENCOUNTER — Encounter: Payer: Self-pay | Admitting: Podiatry

## 2019-11-02 ENCOUNTER — Other Ambulatory Visit: Payer: Self-pay

## 2019-11-02 DIAGNOSIS — M79672 Pain in left foot: Secondary | ICD-10-CM

## 2019-11-02 DIAGNOSIS — Z9889 Other specified postprocedural states: Secondary | ICD-10-CM

## 2019-11-02 DIAGNOSIS — S92002A Unspecified fracture of left calcaneus, initial encounter for closed fracture: Secondary | ICD-10-CM | POA: Diagnosis not present

## 2019-11-03 ENCOUNTER — Other Ambulatory Visit: Payer: Self-pay | Admitting: Gastroenterology

## 2019-11-03 ENCOUNTER — Ambulatory Visit (INDEPENDENT_AMBULATORY_CARE_PROVIDER_SITE_OTHER): Payer: Medicare Other | Admitting: Family Medicine

## 2019-11-03 DIAGNOSIS — H539 Unspecified visual disturbance: Secondary | ICD-10-CM | POA: Diagnosis not present

## 2019-11-03 MED ORDER — PROCHLORPERAZINE MALEATE 10 MG PO TABS: 10.0000 mg | ORAL_TABLET | Freq: Four times a day (QID) | ORAL | 0 refills | Status: DC | PRN

## 2019-11-03 MED ORDER — SUMATRIPTAN SUCCINATE 25 MG PO TABS: 25.0000 mg | ORAL_TABLET | ORAL | 0 refills | Status: DC | PRN

## 2019-11-03 NOTE — Progress Notes (Signed)
    Chief Complaint:  Savannah [name] is a 63 y.o. female who presents today for a virtual office visit with a chief complaint of migraine.   Assessment/Plan:  Visual disturbance Consistent with prior migraine auras.  Discussed limitations of virtual evaluation and inability to perform neurologic exam.  It is reassuring that symptoms have been going on for 10 days and have not significantly changed -doubt this represents medical emergency.  Symptoms are bilateral-doubt retinal or ocular pathology.  We will treat with migraine cocktail of Benadryl 25 mg, Compazine 10 mg, and Imitrex 25 mg.  If symptoms do not improve will need brain imaging and/or follow-up with neurology.  Discussed reasons to return to care and seek emergent care.    Subjective:  HPI:  Migraine  Had surgery 12 days ago on her foot. Was put under anesthesia. 2 days after her surgery she felt a loud pop in her foot. She was found to have a fracture in her heel. Migraine auras started on about the same day.  She is having 2 separate patterns of aura.  She is having arcing lights in her vision.  This is consistent with prior migraines.  She is also having bright flashing lights in the left field of her vision.  This is occurring in both eyes.  She has very minimal headache.  Symptoms have been persistent for the past 10 days.  She called optometrist who recommended she come in for a dilated eye exam.  She also called her neurologist who thought her symptoms initially were due to anesthesia.  She has tried taking tramadol however symptoms have persisted.  No other obvious aggravating relieving factors.  Some mild nausea.  No vomiting.  No weakness or numbness.  ROS: Per HPI  PMH: She reports that she has quit smoking. Her smoking use included cigarettes. She has a 22.50 pack-year smoking history. She has never used smokeless tobacco. She reports current alcohol use. She reports that she does not use drugs.      Objective/Observations   Physical Exam: Gen: NAD, resting comfortably Pulm: Normal work of breathing Neuro: Grossly normal, moves all extremities Psych: Normal affect and thought content  Virtual Visit via Video   I connected with Savannah [name] on 11/03/19 at 11:40 AM EST by a video enabled telemedicine application and verified that I am speaking with the correct person using two identifiers. The limitations of evaluation and management by telemedicine and the availability of in person appointments were discussed. The patient expressed understanding and agreed to proceed.   Patient location: Home Provider location: [place] participating in the virtual visit: Myself and Patient     Algis Greenhouse. Jerline Pain, MD 11/03/2019 11:19 AM

## 2019-11-04 ENCOUNTER — Encounter: Payer: Medicare Other | Admitting: Podiatry

## 2019-11-07 NOTE — Progress Notes (Signed)
   HPI: 63 y.o. female presenting today status post plantar fasciectomy with excision of plantar heel spur left foot. DOS: 10/22/2019. She reports sharp, shooting pain in the foot. She reports associated swelling of the foot. She has been using the CAM boot as directed. There are no modifying factors noted. Patient is here for further evaluation and treatment.   Past Medical History:  Diagnosis Date  . ABDOMINAL PAIN, CHRONIC 10/20/2009  . Acute cystitis 08/17/2009  . ALLERGIC RHINITIS 11/21/2007  . Allergy   . ASYMPTOMATIC POSTMENOPAUSAL STATUS 09/29/2008  . Bariatric surgery status 07/07/2010  . Bariatric surgery status 07/07/2010   Qualifier: Diagnosis of  By: Loanne Drilling MD, Jacelyn Pi   . Cancer ([place])    uterine  . Cataract   . Coronary artery disease   . DEPRESSION 11/21/2007  . DIABETES MELLITUS, TYPE II 07/08/2007  . DYSPNEA 05/20/2009  . Edema 05/20/2009  . Eosinophilic esophagitis 12/18/3333  . FEVER UNSPECIFIED 10/20/2009  . FEVER, HX OF 03/09/2010  . GERD (gastroesophageal reflux disease)   . Headache(784.0) 02/06/2010  . Hepatomegaly 01/06/2008  . HYPERLIPIDEMIA 07/08/2007  . HYPERTENSION 07/08/2007  . LEUKOPENIA, MILD 09/29/2008  . OBESITY 01/06/2008  . OSTEOARTHRITIS 01/06/2008  . Other chronic nonalcoholic liver disease 4/56/2563  . PERIPHERAL NEUROPATHY 11/21/2007  . Peripheral neuropathy   . Postsurgical hypothyroidism 09/19/2010  . SINUSITIS- ACUTE-NOS 11/21/2007  . TB SKIN TEST, POSITIVE 02/06/2010  . THYROID NODULE 03/09/2010     Physical Exam: General: The patient is alert and oriented x3 in no acute distress.  Dermatology: Skin incision to the lateral aspect of the heel is intact with staples intact.  There is some mild maceration noted around the incision site.  Vascular: Palpable pedal pulses bilaterally.  Moderate edema noted to the heel with some slight ecchymosis. Capillary refill within normal limits.  Neurological: Epicritic and protective threshold grossly intact  bilaterally.   Musculoskeletal Exam: Status post plantar heel spur exostectomy with partial plantar fasciectomy.  Radiographic Exam:  X-rays today demonstrate an intra-articular fracture of the body of the calcaneus to the surgical foot.  Fracture extends into the subtalar joint, however at the joint it appears nondisplaced.  There is displacement of the plantar portion of the calcaneal tubercle.  Assessment: 1. Status post plantar heel exostectomy with partial plantar fasciectomy left DOS: 10/22/2019 2. Calcaneal fracture left; see above description and radiographic exam   Plan of Care:  1. Patient evaluated.  2. Unna boot applied.  3. Continue nonweightbearing in CAM boot with knee scooter.  4. Return to clinic in 2 weeks.       Edrick Kins, DPM [facility]  Dr. Edrick Kins, DPM    [address], [place] [address]                Office [phone]  Fax [phone]

## 2019-11-18 ENCOUNTER — Ambulatory Visit (INDEPENDENT_AMBULATORY_CARE_PROVIDER_SITE_OTHER): Payer: Medicare Other | Admitting: Podiatry

## 2019-11-18 ENCOUNTER — Other Ambulatory Visit: Payer: Self-pay

## 2019-11-18 DIAGNOSIS — Z9889 Other specified postprocedural states: Secondary | ICD-10-CM

## 2019-11-18 DIAGNOSIS — M79672 Pain in left foot: Secondary | ICD-10-CM

## 2019-11-18 DIAGNOSIS — M7732 Calcaneal spur, left foot: Secondary | ICD-10-CM

## 2019-11-18 DIAGNOSIS — S92002A Unspecified fracture of left calcaneus, initial encounter for closed fracture: Secondary | ICD-10-CM

## 2019-11-20 ENCOUNTER — Other Ambulatory Visit: Payer: Self-pay | Admitting: Physician Assistant

## 2019-11-20 NOTE — Telephone Encounter (Signed)
Ok to refill 

## 2019-11-20 NOTE — Telephone Encounter (Signed)
Last Visit: 08/12/2019 Next Visit: 02/10/2020 Labs: 07/06/2019 BMP. Creat 1.07, GFR 55  Okay to refill colchicine?

## 2019-11-23 NOTE — Progress Notes (Signed)
   HPI: 64 y.o. female presenting today status post plantar fasciectomy with excision of plantar heel spur left foot. DOS: 10/22/2019. She reports some pain that began last night. She has been using the CAM boot nonweightbearing as directed. She denies any modifying factors. Patient is here for further evaluation and treatment.   Past Medical History:  Diagnosis Date  . ABDOMINAL PAIN, CHRONIC 10/20/2009  . Acute cystitis 08/17/2009  . ALLERGIC RHINITIS 11/21/2007  . Allergy   . ASYMPTOMATIC POSTMENOPAUSAL STATUS 09/29/2008  . Bariatric surgery status 07/07/2010  . Bariatric surgery status 07/07/2010   Qualifier: Diagnosis of  By: Loanne Drilling MD, Jacelyn Pi   . Cancer ([place])    uterine  . Cataract   . Coronary artery disease   . DEPRESSION 11/21/2007  . DIABETES MELLITUS, TYPE II 07/08/2007  . DYSPNEA 05/20/2009  . Edema 05/20/2009  . Eosinophilic esophagitis 5/0/3546  . FEVER UNSPECIFIED 10/20/2009  . FEVER, HX OF 03/09/2010  . GERD (gastroesophageal reflux disease)   . Headache(784.0) 02/06/2010  . Hepatomegaly 01/06/2008  . HYPERLIPIDEMIA 07/08/2007  . HYPERTENSION 07/08/2007  . LEUKOPENIA, MILD 09/29/2008  . OBESITY 01/06/2008  . OSTEOARTHRITIS 01/06/2008  . Other chronic nonalcoholic liver disease 5/68/1275  . PERIPHERAL NEUROPATHY 11/21/2007  . Peripheral neuropathy   . Postsurgical hypothyroidism 09/19/2010  . SINUSITIS- ACUTE-NOS 11/21/2007  . TB SKIN TEST, POSITIVE 02/06/2010  . THYROID NODULE 03/09/2010     Physical Exam: General: The patient is alert and oriented x3 in no acute distress.  Dermatology: Skin incision to the lateral aspect of the heel is intact with staples intact.  There is some mild maceration noted around the incision site.  Vascular: Palpable pedal pulses bilaterally.  Moderate edema noted to the heel with some slight ecchymosis. Capillary refill within normal limits.  Neurological: Epicritic and protective threshold grossly intact bilaterally.   Musculoskeletal Exam:  Status post plantar heel spur exostectomy with partial plantar fasciectomy.   Assessment: 1. Status post plantar heel exostectomy with partial plantar fasciectomy left DOS: 10/22/2019 2. Calcaneal fracture left; see above description and radiographic exam   Plan of Care:  1. Patient evaluated.  2. Staples removed.  3. Continue nonweightbearing in CAM boot with knee scooter.  4. Ace wraps dispensed.  5. Return to clinic in 2 weeks for follow up X-Ray.       Edrick Kins, DPM [facility]  Dr. Edrick Kins, DPM    [address], [place] [address]                Office [phone]  Fax [phone]

## 2019-11-26 ENCOUNTER — Other Ambulatory Visit: Payer: Self-pay | Admitting: Internal Medicine

## 2019-11-26 ENCOUNTER — Other Ambulatory Visit: Payer: Self-pay | Admitting: Endocrinology

## 2019-11-30 ENCOUNTER — Encounter (HOSPITAL_COMMUNITY): Payer: Medicare Other

## 2019-11-30 ENCOUNTER — Ambulatory Visit (INDEPENDENT_AMBULATORY_CARE_PROVIDER_SITE_OTHER): Payer: Medicare Other | Admitting: Podiatry

## 2019-11-30 ENCOUNTER — Other Ambulatory Visit: Payer: Self-pay

## 2019-11-30 ENCOUNTER — Ambulatory Visit (INDEPENDENT_AMBULATORY_CARE_PROVIDER_SITE_OTHER): Payer: Medicare Other

## 2019-11-30 DIAGNOSIS — S92002A Unspecified fracture of left calcaneus, initial encounter for closed fracture: Secondary | ICD-10-CM

## 2019-11-30 DIAGNOSIS — Z9889 Other specified postprocedural states: Secondary | ICD-10-CM

## 2019-12-01 NOTE — Progress Notes (Signed)
   HPI: 64 y.o. female presenting today status post plantar fasciectomy with excision of plantar heel spur left foot. DOS: 10/22/2019. She subsequently had a fracture of the calcaneus after a 'hard step' while wearing her CAM boot the day after her surgery. DOI 10/23/2019.  Patient states that she does not have much pain at the moment.  She is doing very well and she has been nonweightbearing in her cam boot using a knee scooter.  No new complaints at this time  Past Medical History:  Diagnosis Date  . ABDOMINAL PAIN, CHRONIC 10/20/2009  . Acute cystitis 08/17/2009  . ALLERGIC RHINITIS 11/21/2007  . Allergy   . ASYMPTOMATIC POSTMENOPAUSAL STATUS 09/29/2008  . Bariatric surgery status 07/07/2010  . Bariatric surgery status 07/07/2010   Qualifier: Diagnosis of  By: Loanne Drilling MD, Jacelyn Pi   . Cancer ([place])    uterine  . Cataract   . Coronary artery disease   . DEPRESSION 11/21/2007  . DIABETES MELLITUS, TYPE II 07/08/2007  . DYSPNEA 05/20/2009  . Edema 05/20/2009  . Eosinophilic esophagitis 0000000  . FEVER UNSPECIFIED 10/20/2009  . FEVER, HX OF 03/09/2010  . GERD (gastroesophageal reflux disease)   . Headache(784.0) 02/06/2010  . Hepatomegaly 01/06/2008  . HYPERLIPIDEMIA 07/08/2007  . HYPERTENSION 07/08/2007  . LEUKOPENIA, MILD 09/29/2008  . OBESITY 01/06/2008  . OSTEOARTHRITIS 01/06/2008  . Other chronic nonalcoholic liver disease [id number]  . PERIPHERAL NEUROPATHY 11/21/2007  . Peripheral neuropathy   . Postsurgical hypothyroidism 09/19/2010  . SINUSITIS- ACUTE-NOS 11/21/2007  . TB SKIN TEST, POSITIVE 02/06/2010  . THYROID NODULE 03/09/2010     Physical Exam: General: The patient is alert and oriented x3 in no acute distress.  Dermatology: Skin incision to the lateral aspect of the heel is intact with staples intact.  There is some mild maceration noted around the incision site.  Vascular: Palpable pedal pulses bilaterally.  Moderate edema noted to the heel with some slight ecchymosis. Capillary refill  within normal limits.  Neurological: Epicritic and protective threshold grossly intact bilaterally.   Musculoskeletal Exam: Status post plantar heel spur exostectomy with partial plantar fasciectomy.  Radiographic exam: There does not appear to be any change based on prior x-rays.  There is large gapping of the body of the calcaneus, however at the level of the subtalar joint intra-articularly it appears intact with minimal disruption.  Assessment: 1. Status post plantar heel exostectomy with partial plantar fasciectomy left DOS: 10/22/2019 2. Calcaneal fracture left, stable with routine healing   Plan of Care:  1. Patient evaluated.  2. Continue nonweightbearing in CAM boot with knee scooter.  4. Ace wraps dispensed.  Continue compression daily 5. Return to clinic in 6 weeks for follow up X-Ray.       Edrick Kins, DPM [facility]  Dr. Edrick Kins, DPM    [address], [place] [address]                Office [phone]  Fax [phone]

## 2019-12-03 ENCOUNTER — Telehealth: Payer: Self-pay | Admitting: *Deleted

## 2019-12-03 ENCOUNTER — Encounter: Payer: Self-pay | Admitting: *Deleted

## 2019-12-03 ENCOUNTER — Other Ambulatory Visit: Payer: Self-pay | Admitting: Internal Medicine

## 2019-12-03 DIAGNOSIS — R29898 Other symptoms and signs involving the musculoskeletal system: Secondary | ICD-10-CM

## 2019-12-03 MED ORDER — ATORVASTATIN CALCIUM 40 MG PO TABS: 40.0000 mg | ORAL_TABLET | Freq: Every day | ORAL | 1 refills | Status: DC

## 2019-12-03 NOTE — Telephone Encounter (Signed)
Okay to use atorvastatin and colchicine together.  "cochicine may increase risk of muscle pain with statins"  May decrease amlodipine dose to 39m daily if needed

## 2019-12-03 NOTE — Telephone Encounter (Signed)
Called and cancelled refill of Atorvastatin. Interaction of Atorvastatin and Colchicine came up, will forward to Pharm D for review

## 2019-12-03 NOTE — Telephone Encounter (Signed)
Refill sent to [facility].  

## 2019-12-03 NOTE — Telephone Encounter (Signed)
This encounter was created in error - please disregard.

## 2019-12-07 ENCOUNTER — Telehealth: Payer: Self-pay | Admitting: *Deleted

## 2019-12-07 MED ORDER — MITIGARE 0.6 MG PO CAPS: 0.6000 mg | ORAL_CAPSULE | Freq: Every day | ORAL | 2 refills | Status: DC

## 2019-12-07 NOTE — Telephone Encounter (Signed)
Received a fax from [facility] stating the preferred medication is Mitigare over Colchicine. Per Savannah Sams, PA-C okay to send in [place] prescription.

## 2019-12-11 ENCOUNTER — Other Ambulatory Visit: Payer: Self-pay | Admitting: Endocrinology

## 2019-12-11 ENCOUNTER — Telehealth: Payer: Self-pay | Admitting: Rheumatology

## 2019-12-11 NOTE — Telephone Encounter (Signed)
Patient received a letter from her insurance company stating Colchicine is not a covered medication. Patient has a replacement listed of Colchicine Medigare tablets. Please call to discuss.

## 2019-12-11 NOTE — Telephone Encounter (Signed)
Patient advised we received the letter from her insurance and have already sent prescriptions in for [place]. Patient requested it to be a 90 day supply. Prescription sent originally for a 30 day supply with 2 additional refill. Called pharmacy and changed prescription to a 90 day supply.

## 2019-12-18 ENCOUNTER — Inpatient Hospital Stay (HOSPITAL_COMMUNITY): Admission: RE | Admit: 2019-12-18 | Payer: Medicare Other | Source: Ambulatory Visit

## 2019-12-21 ENCOUNTER — Ambulatory Visit: Payer: Medicare Other

## 2019-12-21 ENCOUNTER — Ambulatory Visit: Payer: Medicare Other | Admitting: Internal Medicine

## 2019-12-21 ENCOUNTER — Ambulatory Visit: Payer: Medicare Other | Attending: Internal Medicine

## 2019-12-21 DIAGNOSIS — Z23 Encounter for immunization: Secondary | ICD-10-CM | POA: Insufficient documentation

## 2019-12-21 NOTE — Progress Notes (Signed)
   Covid-19 Vaccination Clinic  Name:  Savannah [name]    MRN: [id number] DOB: 1956/04/15  12/21/2019  Ms. Ran was observed post Covid-19 immunization for 15 minutes without incidence. She was provided with Vaccine Information Sheet and instruction to access the V-Safe system.   [name] was instructed to call 911 with any severe reactions post vaccine: [name] Difficulty breathing  . Swelling of your face and throat  . A fast heartbeat  . A bad rash all over your body  . Dizziness and weakness    Immunizations Administered    Name Date Dose VIS Date Route   Pfizer COVID-19 Vaccine 12/21/2019 10:26 AM 0.3 mL 10/23/2019 Intramuscular   Manufacturer: [place]   Lot: [id number]   [place]: [id number]

## 2020-01-04 ENCOUNTER — Ambulatory Visit (INDEPENDENT_AMBULATORY_CARE_PROVIDER_SITE_OTHER): Payer: Medicare Other | Admitting: Podiatry

## 2020-01-04 ENCOUNTER — Ambulatory Visit (INDEPENDENT_AMBULATORY_CARE_PROVIDER_SITE_OTHER): Payer: Medicare Other

## 2020-01-04 ENCOUNTER — Other Ambulatory Visit: Payer: Self-pay

## 2020-01-04 DIAGNOSIS — S92002A Unspecified fracture of left calcaneus, initial encounter for closed fracture: Secondary | ICD-10-CM | POA: Diagnosis not present

## 2020-01-04 DIAGNOSIS — S92002S Unspecified fracture of left calcaneus, sequela: Secondary | ICD-10-CM

## 2020-01-04 DIAGNOSIS — Z9889 Other specified postprocedural states: Secondary | ICD-10-CM

## 2020-01-05 ENCOUNTER — Other Ambulatory Visit: Payer: Self-pay | Admitting: Podiatry

## 2020-01-05 DIAGNOSIS — S92002S Unspecified fracture of left calcaneus, sequela: Secondary | ICD-10-CM

## 2020-01-07 ENCOUNTER — Other Ambulatory Visit: Payer: Self-pay

## 2020-01-07 DIAGNOSIS — G603 Idiopathic progressive neuropathy: Secondary | ICD-10-CM | POA: Diagnosis not present

## 2020-01-07 DIAGNOSIS — M542 Cervicalgia: Secondary | ICD-10-CM | POA: Diagnosis not present

## 2020-01-07 DIAGNOSIS — G5602 Carpal tunnel syndrome, left upper limb: Secondary | ICD-10-CM | POA: Diagnosis not present

## 2020-01-07 DIAGNOSIS — Z79899 Other long term (current) drug therapy: Secondary | ICD-10-CM | POA: Diagnosis not present

## 2020-01-07 DIAGNOSIS — M545 Low back pain: Secondary | ICD-10-CM | POA: Diagnosis not present

## 2020-01-08 ENCOUNTER — Ambulatory Visit: Payer: Medicare Other

## 2020-01-11 ENCOUNTER — Ambulatory Visit: Payer: Medicare Other | Admitting: Endocrinology

## 2020-01-12 NOTE — Progress Notes (Signed)
   HPI: 64 y.o. female presenting today status post plantar fasciectomy with excision of plantar heel spur left foot. DOS: 10/22/2019. She subsequently had a fracture of the calcaneus after a 'hard step' while wearing her CAM boot the day after her surgery. DOI 10/23/2019.  Patient has been nonweightbearing in the cam boot using a knee scooter.  She denies any significant pain.  She notices some modest improvement.  She continues to be nonweightbearing to the surgical foot.  Past Medical History:  Diagnosis Date  . ABDOMINAL PAIN, CHRONIC 10/20/2009  . Acute cystitis 08/17/2009  . ALLERGIC RHINITIS 11/21/2007  . Allergy   . ASYMPTOMATIC POSTMENOPAUSAL STATUS 09/29/2008  . Bariatric surgery status 07/07/2010  . Bariatric surgery status 07/07/2010   Qualifier: Diagnosis of  By: Loanne Drilling MD, Jacelyn Pi   . Cancer ([place])    uterine  . Cataract   . Coronary artery disease   . DEPRESSION 11/21/2007  . DIABETES MELLITUS, TYPE II 07/08/2007  . DYSPNEA 05/20/2009  . Edema 05/20/2009  . Eosinophilic esophagitis 05/14/4192  . FEVER UNSPECIFIED 10/20/2009  . FEVER, HX OF 03/09/2010  . GERD (gastroesophageal reflux disease)   . Headache(784.0) 02/06/2010  . Hepatomegaly 01/06/2008  . HYPERLIPIDEMIA 07/08/2007  . HYPERTENSION 07/08/2007  . LEUKOPENIA, MILD 09/29/2008  . OBESITY 01/06/2008  . OSTEOARTHRITIS 01/06/2008  . Other chronic nonalcoholic liver disease 7/90/2409  . PERIPHERAL NEUROPATHY 11/21/2007  . Peripheral neuropathy   . Postsurgical hypothyroidism 09/19/2010  . SINUSITIS- ACUTE-NOS 11/21/2007  . TB SKIN TEST, POSITIVE 02/06/2010  . THYROID NODULE 03/09/2010     Physical Exam: General: The patient is alert and oriented x3 in no acute distress.  Dermatology: Skin incision to the lateral aspect of the heel is intact with staples intact.  There is some mild maceration noted around the incision site.  Vascular: Palpable pedal pulses bilaterally.  Minimal edema noted to the heel with improved ecchymosis.  Capillary refill within normal limits.  Neurological: Epicritic and protective threshold grossly intact bilaterally.   Musculoskeletal Exam: Status post plantar heel spur exostectomy with partial plantar fasciectomy.  Radiographic exam: Fracture noted through the body of the calcaneus with evidence of callus formation and healing.  Assessment: 1. Status post plantar heel exostectomy with partial plantar fasciectomy left DOS: 10/22/2019 2. Calcaneal fracture left, stable with routine healing   Plan of Care:  1. Patient evaluated.  2.  Patient may begin light, minimal weightbearing in the cam boot.  Continue using the knee scooter for assistance 3.  Return to clinic in 4 weeks for follow-up x-ray and to initiate possible physical therapy     Edrick Kins, DPM [facility]  Dr. Edrick Kins, DPM    [address], [place] [address]                Office [phone]  Fax [phone]

## 2020-01-14 ENCOUNTER — Ambulatory Visit: Payer: Medicare Other | Attending: Internal Medicine

## 2020-01-14 ENCOUNTER — Other Ambulatory Visit: Payer: Self-pay

## 2020-01-14 ENCOUNTER — Encounter: Payer: Self-pay | Admitting: Endocrinology

## 2020-01-14 ENCOUNTER — Ambulatory Visit (INDEPENDENT_AMBULATORY_CARE_PROVIDER_SITE_OTHER): Payer: Medicare Other | Admitting: Endocrinology

## 2020-01-14 VITALS — BP 110/70 | HR 75 | Ht 70.5 in | Wt 166.0 lb

## 2020-01-14 DIAGNOSIS — Z23 Encounter for immunization: Secondary | ICD-10-CM | POA: Insufficient documentation

## 2020-01-14 DIAGNOSIS — E1140 Type 2 diabetes mellitus with diabetic neuropathy, unspecified: Secondary | ICD-10-CM | POA: Diagnosis not present

## 2020-01-14 LAB — BASIC METABOLIC PANEL
BUN: 23 mg/dL (ref 6–23)
CO2: 28 mEq/L (ref 19–32)
Calcium: 9.2 mg/dL (ref 8.4–10.5)
Chloride: 106 mEq/L (ref 96–112)
Creatinine, Ser: 1.06 mg/dL (ref 0.40–1.20)
GFR: 51.91 mL/min — ABNORMAL LOW (ref >60.00)
Glucose, Bld: 137 mg/dL — ABNORMAL HIGH (ref 70–99)
Potassium: 4.7 mEq/L (ref 3.5–5.1)
Sodium: 141 mEq/L (ref 135–145)

## 2020-01-14 LAB — POCT GLYCOSYLATED HEMOGLOBIN (HGB A1C): Hemoglobin A1C: 7.1 % — AB (ref 4.0–5.6)

## 2020-01-14 LAB — TSH: TSH: 0.92 u[IU]/mL (ref 0.35–4.50)

## 2020-01-14 LAB — MICROALBUMIN / CREATININE URINE RATIO
Creatinine,U: 66.9 mg/dL
Microalb Creat Ratio: 2.5 mg/g (ref 0.0–30.0)
Microalb, Ur: 1.7 mg/dL (ref 0.0–1.9)

## 2020-01-14 MED ORDER — TRULICITY 3 MG/0.5ML ~~LOC~~ SOAJ: 3.0000 mg | SUBCUTANEOUS | 3 refills | Status: DC

## 2020-01-14 MED ORDER — CANAGLIFLOZIN 300 MG PO TABS: 300.0000 mg | ORAL_TABLET | Freq: Every day | ORAL | 3 refills | Status: DC

## 2020-01-14 MED ORDER — REPAGLINIDE 2 MG PO TABS: 2.0000 mg | ORAL_TABLET | Freq: Two times a day (BID) | ORAL | 3 refills | Status: DC

## 2020-01-14 NOTE — Progress Notes (Signed)
Subjective:    Patient ID: Savannah [name], female    DOB: 03-25-1956, 64 y.o.   MRN: [id number]  HPI Pt returns for f/u of diabetes mellitus:  DM type: 2 Dx'ed: 1638 Complications: painful polyneuropathy, and CAD (seen on CT).   Therapy: trulicity and 3 oral meds.   GDM: never DKA: never Severe hypoglycemia: never.   Pancreatitis: never.  Other: she took insulin from 2004-2011, when she had gastric bypass surgery.  She is retired Interval history: She says cbg's are in the low-100's.  It is in general lowest after she takes repaglinide, but does not eat. She recently fx left foot.  She seldom has hypoglycemia, and these episodes are mild.   Past Medical History:  Diagnosis Date  . ABDOMINAL PAIN, CHRONIC 10/20/2009  . Acute cystitis 08/17/2009  . ALLERGIC RHINITIS 11/21/2007  . Allergy   . ASYMPTOMATIC POSTMENOPAUSAL STATUS 09/29/2008  . Bariatric surgery status 07/07/2010  . Bariatric surgery status 07/07/2010   Qualifier: Diagnosis of  By: Loanne Drilling MD, Jacelyn Pi   . Cancer ([place])    uterine  . Cataract   . Coronary artery disease   . DEPRESSION 11/21/2007  . DIABETES MELLITUS, TYPE II 07/08/2007  . DYSPNEA 05/20/2009  . Edema 05/20/2009  . Eosinophilic esophagitis 02/15/6598  . FEVER UNSPECIFIED 10/20/2009  . FEVER, HX OF 03/09/2010  . GERD (gastroesophageal reflux disease)   . Headache(784.0) 02/06/2010  . Hepatomegaly 01/06/2008  . HYPERLIPIDEMIA 07/08/2007  . HYPERTENSION 07/08/2007  . LEUKOPENIA, MILD 09/29/2008  . OBESITY 01/06/2008  . OSTEOARTHRITIS 01/06/2008  . Other chronic nonalcoholic liver disease 3/57/0177  . PERIPHERAL NEUROPATHY 11/21/2007  . Peripheral neuropathy   . Postsurgical hypothyroidism 09/19/2010  . SINUSITIS- ACUTE-NOS 11/21/2007  . TB SKIN TEST, POSITIVE 02/06/2010  . THYROID NODULE 03/09/2010    Past Surgical History:  Procedure Laterality Date  . ABDOMINAL HYSTERECTOMY    . BASAL CELL CARCINOMA EXCISION    . CARDIAC CATHETERIZATION    . COLONOSCOPY    . EYE  SURGERY Bilateral 08/2018, 09/2018  . KNEE ARTHROSCOPY Left   . LEFT HEART CATH AND CORONARY ANGIOGRAPHY N/A 01/20/2019   Procedure: LEFT HEART CATH AND CORONARY ANGIOGRAPHY;  Surgeon: Belva Crome, MD;  Location: [facility];  Service: Cardiovascular;  Laterality: N/A;  . LYMPH NODE DISSECTION    . OOPHORECTOMY    . Right total knee replacement    . ROUX-EN-Y GASTRIC BYPASS  2011  . SKIN TAG REMOVAL    . THYROIDECTOMY    . TONSILLECTOMY AND ADENOIDECTOMY    . UPPER GASTROINTESTINAL ENDOSCOPY      Social History   Socioeconomic History  . Marital status: Single    Spouse name: Not on file  . Number of children: 0  . Years of education: 16  . Highest education level: Bachelor's degree (e.g., BA, AB, BS)  Occupational History  . Occupation: Optometrist  Tobacco Use  . Smoking status: Former Smoker    Packs/day: 1.50    Years: 15.00    Pack years: 22.50    Types: Cigarettes  . Smokeless tobacco: Never Used  Substance and Sexual Activity  . Alcohol use: Yes    Alcohol/week: 0.0 standard drinks    Comment: rarely  . Drug use: Never  . Sexual activity: Not on file  Other Topics Concern  . Not on file  Social History Narrative  . Not on file   Social Determinants of Health   Financial Resource Strain: Low Risk   .  Difficulty of Paying Living Expenses: Not hard at all  Food Insecurity: No Food Insecurity  . Worried About Charity fundraiser in the Last Year: Never true  . Ran Out of Food in the Last Year: Never true  Transportation Needs: No Transportation Needs  . Lack of Transportation (Medical): No  . Lack of Transportation (Non-Medical): No  Physical Activity: Sufficiently Active  . Days of Exercise per Week: 5 days  . Minutes of Exercise per Session: 30 min  Stress: No Stress Concern Present  . Feeling of Stress : Not at all  Social Connections:   . Frequency of Communication with Friends and Family: Not on file  . Frequency of Social Gatherings with  Friends and Family: Not on file  . Attends Religious Services: Not on file  . Active Member of Clubs or Organizations: Not on file  . Attends Archivist Meetings: Not on file  . Marital Status: Not on file  Intimate Partner Violence:   . Fear of Current or Ex-Partner: Not on file  . Emotionally Abused: Not on file  . Physically Abused: Not on file  . Sexually Abused: Not on file    Current Outpatient Medications on File Prior to Visit  Medication Sig Dispense Refill  . aspirin EC 81 MG tablet Take 1 tablet (81 mg total) by mouth daily. Starting 01/18/19 prior to cardiac cath    . atorvastatin (LIPITOR) 40 MG tablet Take 1 tablet (40 mg total) by mouth daily. 90 tablet 1  . Biotin-D POWD Take 1 tablet by mouth daily.    . Cholecalciferol (VITAMIN D3) 125 MCG (5000 UT) CAPS Take 5,000 Units by mouth daily.    . diclofenac sodium (VOLTAREN) 1 % GEL Apply 4 g topically 4 (four) times daily. (Patient taking differently: Apply 4 g topically as needed. ) 100 g 11  . docusate sodium (COLACE) 100 MG capsule Take 200 mg by mouth 2 (two) times daily.    [name] glucose blood test strip 1 each by Other route 3 (three) times daily. Use to monitor glucose levels TID; E11.40 100 each 2  . halobetasol (ULTRAVATE) 0.05 % cream Apply 1 application topically 2 (two) times daily as needed (lichen sclerosus).   2  . HYDROcodone-acetaminophen (NORCO/VICODIN) 5-325 MG tablet Take 1 tablet by mouth daily as needed for moderate pain or severe pain.    [name] levocetirizine (XYZAL ALLERGY 24HR) 5 MG tablet Take 1 tablet by mouth daily.    [name] levothyroxine (SYNTHROID) 112 MCG tablet TAKE 1 TABLET BY MOUTH EVERY DAY 90 tablet 3  . MAGNESIUM PO Take 1 tablet by mouth daily as needed (cramps).     . metFORMIN (GLUCOPHAGE-XR) 500 MG 24 hr tablet Take 1 tablet (500 mg total) by mouth 2 (two) times daily. (Patient taking differently: Take 1,000 mg by mouth 2 (two) times daily. ) 180 tablet 3  . MITIGARE 0.6 MG CAPS Take 0.6  mg by mouth daily. 30 capsule 2  . Multiple Vitamin (MULTIVITAMIN) tablet Take 1 tablet by mouth daily.     . nitroGLYCERIN (NITROSTAT) 0.4 MG SL tablet Place 1 tablet (0.4 mg total) under the tongue every 5 (five) minutes as needed. 25 tablet 3  . oxyCODONE-acetaminophen (PERCOCET) 5-325 MG tablet Take 1 tablet by mouth every 6 (six) hours as needed for severe pain. 30 tablet 0  . pantoprazole (PROTONIX) 40 MG tablet TAKE 1 TABLET BY MOUTH TWICE A DAY 180 tablet 1  . phenazopyridine (PYRIDIUM) 100 MG  tablet Take 100 mg by mouth 3 (three) times daily as needed for pain.    . Probiotic CAPS Take 1 capsule by mouth daily.    . prochlorperazine (COMPAZINE) 10 MG tablet Take 1 tablet (10 mg total) by mouth every 6 (six) hours as needed (migraine). Take with imitrex and benadryl 30 tablet 0  . SUMAtriptan (IMITREX) 25 MG tablet Take 1 tablet (25 mg total) by mouth every 2 (two) hours as needed for migraine. Take with benadryl and compazine 10 tablet 0  . traMADol (ULTRAM) 50 MG tablet Take 50 mg by mouth 2 (two) times daily as needed for moderate pain.    . TURMERIC CURCUMIN PO Take 1 capsule by mouth daily.    . vitamin B-12 (CYANOCOBALAMIN) 1000 MCG tablet Take 3,000-4,000 mcg by mouth daily.    . furosemide (LASIX) 20 MG tablet Take 1 tablet (20 mg total) by mouth daily. (Patient taking differently: Take 20 mg by mouth as needed. ) 90 tablet 3   No current facility-administered medications on file prior to visit.    Allergies  Allergen Reactions  . Ace Inhibitors Cough  . Caffeine     PVCs  . Ciprofloxacin Itching  . Morphine And Related Itching    Pt tolerates medication if given with diphenhydramine  . Mucinex [Guaifenesin Er]     PVCs  . Nsaids     Gastric Bypass Surgery - unable to take, tolerates ec 81 aspirin   . Other     Antihistamine-alkylamine - PVCs tolerates benadryl     Family History  Problem Relation Age of Onset  . Multiple sclerosis Sister   . Breast cancer Sister          breast onset age 36  . Osteoporosis Sister   . Diabetes Mother   . Diabetes Father   . Congestive Heart Failure Father   . Hypertension Father   . Myelodysplastic syndrome Father   . Liver cancer Maternal Aunt   . Breast cancer Maternal Aunt   . Diabetes Maternal Aunt   . Colon cancer Maternal Uncle   . Diabetes Maternal Uncle   . Diabetes Maternal Grandmother   . Diabetes Paternal Grandmother     BP 110/70 (BP Location: Left Arm, Patient Position: Sitting, Cuff Size: Normal)   Pulse 75   Ht 5' 10.5" (1.791 m)   Wt 166 lb (75.3 kg)   SpO2 97%   BMI 23.48 kg/m    Review of Systems Denies LOC    Objective:   Physical Exam VITAL SIGNS:  See vs page GENERAL: no distress Pulses: dorsalis pedis intact bilat.   MSK: no deformity of the feet CV: 1+ bilat leg leg edema Skin:  no ulcer on the feet.  normal color and temp on the feet.  healed surgical scar on the left foot.  Neuro: sensation is intact to touch on the feet.     Lab Results  Component Value Date   HGBA1C 7.1 (A) 01/14/2020      Assessment & Plan:  Type 2 DM, with CAD: worse Edema: This limits rx options  Patient Instructions  I have sent a prescription to your pharmacy, to increase the trulicity again, and: Please continue the same other diabetes medications. check your blood sugar once a day.  vary the time of day when you check, between before the 3 meals, and at bedtime.  also check if you have symptoms of your blood sugar being too high or too low.  please  keep a record of the readings and bring it to your next appointment here (or you can bring the meter itself).  You can write it on any piece of paper.  please call us sooner if your blood sugar goes below 70, or if you have a lot of readings over 200. Please come back for a follow-up appointment in 3-4 months.

## 2020-01-14 NOTE — Patient Instructions (Signed)
I have sent a prescription to your pharmacy, to increase the trulicity again, and: Please continue the same other diabetes medications. check your blood sugar once a day.  vary the time of day when you check, between before the 3 meals, and at bedtime.  also check if you have symptoms of your blood sugar being too high or too low.  please keep a record of the readings and bring it to your next appointment here (or you can bring the meter itself).  You can write it on any piece of paper.  please call us sooner if your blood sugar goes below 70, or if you have a lot of readings over 200. Please come back for a follow-up appointment in 3-4 months.

## 2020-01-14 NOTE — Progress Notes (Signed)
   Covid-19 Vaccination Clinic  Name:  Savannah [name]    MRN: [id number] DOB: [birth year]  01/14/2020  [name] was observed post Covid-19 immunization for 15 minutes without incident. She was provided with Vaccine Information Sheet and instruction to access the V-Safe system.   [name] was instructed to call 911 with any severe reactions post vaccine: [name] Difficulty breathing  . Swelling of face and throat  . A fast heartbeat  . A bad rash all over body  . Dizziness and weakness   Immunizations Administered    Name Date Dose VIS Date Route   Pfizer COVID-19 Vaccine 01/14/2020  5:22 PM 0.3 mL 10/23/2019 Intramuscular   Manufacturer: [place]   Lot: [id number]   [place]: [id number]

## 2020-01-20 ENCOUNTER — Encounter: Payer: Self-pay | Admitting: Podiatry

## 2020-01-27 DIAGNOSIS — H04123 Dry eye syndrome of bilateral lacrimal glands: Secondary | ICD-10-CM | POA: Diagnosis not present

## 2020-01-27 DIAGNOSIS — E119 Type 2 diabetes mellitus without complications: Secondary | ICD-10-CM | POA: Diagnosis not present

## 2020-01-27 DIAGNOSIS — H353131 Nonexudative age-related macular degeneration, bilateral, early dry stage: Secondary | ICD-10-CM | POA: Diagnosis not present

## 2020-01-27 DIAGNOSIS — D3132 Benign neoplasm of left choroid: Secondary | ICD-10-CM | POA: Diagnosis not present

## 2020-02-01 ENCOUNTER — Telehealth: Payer: Self-pay | Admitting: Endocrinology

## 2020-02-01 ENCOUNTER — Other Ambulatory Visit: Payer: Self-pay | Admitting: Endocrinology

## 2020-02-01 ENCOUNTER — Other Ambulatory Visit: Payer: Self-pay

## 2020-02-01 ENCOUNTER — Ambulatory Visit (INDEPENDENT_AMBULATORY_CARE_PROVIDER_SITE_OTHER): Payer: Medicare Other

## 2020-02-01 ENCOUNTER — Ambulatory Visit (INDEPENDENT_AMBULATORY_CARE_PROVIDER_SITE_OTHER): Payer: Medicare Other | Admitting: Podiatry

## 2020-02-01 DIAGNOSIS — S92002A Unspecified fracture of left calcaneus, initial encounter for closed fracture: Secondary | ICD-10-CM

## 2020-02-01 MED ORDER — TRULICITY 1.5 MG/0.5ML ~~LOC~~ SOAJ: 1.5000 mg | SUBCUTANEOUS | 3 refills | Status: DC

## 2020-02-01 NOTE — Telephone Encounter (Signed)
I have sent a prescription to your pharmacy, to reduce

## 2020-02-01 NOTE — Telephone Encounter (Signed)
Patient called stating the new dosage 3.0 for trulicity is too expensive for her and requests a new RX for the old dosage she was on for 1.5 to be sent to her pharmacy so she can go back to taking that.  [id number] [place], [place] - [address]:  [phone] Fax:  [phone]    Ph# [phone]

## 2020-02-01 NOTE — Telephone Encounter (Signed)
Please advise 

## 2020-02-03 NOTE — Progress Notes (Signed)
   HPI: 64 y.o. female presenting today status post plantar fasciectomy with excision of plantar heel spur left foot. DOS: 10/22/2019. She subsequently had a fracture of the calcaneus after a 'hard step' while wearing her CAM boot the day after her surgery. DOI 10/23/2019. She states she is doing well and improving. She has been using the CAM boot and states walking helps alleviate any pain she may have. She denies worsening factors at this time. Patient is here for further evaluation and treatment.   Past Medical History:  Diagnosis Date  . ABDOMINAL PAIN, CHRONIC 10/20/2009  . Acute cystitis 08/17/2009  . ALLERGIC RHINITIS 11/21/2007  . Allergy   . ASYMPTOMATIC POSTMENOPAUSAL STATUS 09/29/2008  . Bariatric surgery status 07/07/2010  . Bariatric surgery status 07/07/2010   Qualifier: Diagnosis of  By: Loanne Drilling MD, Jacelyn Pi   . Cancer ([place])    uterine  . Cataract   . Coronary artery disease   . DEPRESSION 11/21/2007  . DIABETES MELLITUS, TYPE II 07/08/2007  . DYSPNEA 05/20/2009  . Edema 05/20/2009  . Eosinophilic esophagitis 07/14/6383  . FEVER UNSPECIFIED 10/20/2009  . FEVER, HX OF 03/09/2010  . GERD (gastroesophageal reflux disease)   . Headache(784.0) 02/06/2010  . Hepatomegaly 01/06/2008  . HYPERLIPIDEMIA 07/08/2007  . HYPERTENSION 07/08/2007  . LEUKOPENIA, MILD 09/29/2008  . OBESITY 01/06/2008  . OSTEOARTHRITIS 01/06/2008  . Other chronic nonalcoholic liver disease 6/65/9935  . PERIPHERAL NEUROPATHY 11/21/2007  . Peripheral neuropathy   . Postsurgical hypothyroidism 09/19/2010  . SINUSITIS- ACUTE-NOS 11/21/2007  . TB SKIN TEST, POSITIVE 02/06/2010  . THYROID NODULE 03/09/2010     Physical Exam: General: The patient is alert and oriented x3 in no acute distress.  Dermatology: Skin incision to the lateral aspect of the heel is intact with staples intact.  There is some mild maceration noted around the incision site.  Vascular: Palpable pedal pulses bilaterally.  Minimal edema noted to the heel with  improved ecchymosis. Capillary refill within normal limits.  Neurological: Epicritic and protective threshold grossly intact bilaterally.   Musculoskeletal Exam: Status post plantar heel spur exostectomy with partial plantar fasciectomy.  Radiographic exam: Fracture noted through the body of the calcaneus with evidence of callus formation and healing.  Assessment: 1. Status post plantar heel exostectomy with partial plantar fasciectomy left DOS: 10/22/2019 2. Calcaneal fracture left, stable with routine healing   Plan of Care:  1. Patient evaluated. X-Rays reviewed.  2. Discontinue using CAM boot.  3. Recommended good sneakers. 4. Return to clinic in 6 weeks for final follow up X-Ray.     Edrick Kins, DPM [facility]  Dr. Edrick Kins, DPM    [address], [place] [address]                Office [phone]  Fax [phone]

## 2020-02-03 NOTE — Progress Notes (Signed)
Office Visit Note  Patient: Savannah [name]             Date of Birth: 1956-09-09           MRN: [id number]             PCP: Vivi Barrack, MD Referring: Vivi Barrack, MD Visit Date: 02/10/2020 Occupation: @GUAROCC @  Subjective:  Pain in joints   History of Present Illness: Savannah [name] is a 64 y.o. female with history of inflammatory arthritis and osteoarthritis overlap.  She states she has been doing quite well on colchicine.  If she does not take colchicine she notices swelling on her left wrist.  She has been having some discomfort in the left forearm.  She continues to have discomfort in her bilateral CMC joints and her PIP and DIP joints.  She states she does have a [employer] brace but she does not like to wear it on a regular basis.  Her right total knee replacement is doing well.  She continues to have some discomfort in her right trochanteric area.  She is going to physical therapy.  Activities of Daily Living:  Patient reports morning stiffness for 0 minutes.   Patient Reports nocturnal pain.  Difficulty dressing/grooming: Denies Difficulty climbing stairs: Reports Difficulty getting out of chair: Reports Difficulty using hands for taps, buttons, cutlery, and/or writing: Reports  Review of Systems  Constitutional: Negative for fatigue.  HENT: Positive for mouth dryness. Negative for mouth sores and nose dryness.   Eyes: Positive for dryness. Negative for pain and itching.  Respiratory: Positive for shortness of breath. Negative for difficulty breathing.   Cardiovascular: Negative for chest pain and palpitations.  Gastrointestinal: Negative for blood in stool, constipation and diarrhea.  Endocrine: Negative for increased urination.  Genitourinary: Negative for difficulty urinating and painful urination.  Musculoskeletal: Positive for joint swelling and muscle weakness. Negative for arthralgias, joint pain and morning stiffness.  Skin: Negative for rash and redness.    Allergic/Immunologic: Negative for susceptible to infections.  Neurological: Positive for numbness. Negative for dizziness, headaches, memory loss and weakness.  Hematological: Positive for bruising/bleeding tendency.  Psychiatric/Behavioral: Negative for confusion.    PMFS History:  Patient Active Problem List   Diagnosis Date Noted  . Breast lump 09/02/2019  . Lesion of vulva 09/02/2019  . Menorrhagia 09/02/2019  . Neuropathy 09/02/2019  . Abdominal pain, epigastric 06/25/2019  . Irregular bowel habits 06/25/2019  . Coronary artery disease involving native coronary artery of native heart without angina pectoris   . Temporomandibular joint disorder 12/05/2018  . Fracture of upper limb 12/05/2018  . Fatigue 12/05/2018  . Inverted nipple 12/05/2018  . Senile purpura ([place]) 11/21/2018  . GERD with esophagitis 11/21/2018  . Pseudogout involving multiple joints 11/21/2018  . Lipoma 07/22/2017  . Lichen sclerosus 38/93/7342  . H/O gastric bypass 11/16/2016  . GERD (gastroesophageal reflux disease) 11/16/2016  . Depression 11/16/2016  . Diabetic polyneuropathy associated with type 2 diabetes mellitus ([place]) 11/16/2016  . Hyperlipidemia 11/16/2016  . Left lower quadrant pain 11/16/2016  . Uterine cancer [facility]) s/p hysterectomy 1997 01/04/2012  . Postsurgical hypothyroidism 09/19/2010  . Generalized abdominal pain 10/20/2009  . Diabetic neuropathy ([place]) 11/21/2007  . Allergic rhinitis 11/21/2007  . Type 2 diabetes mellitus with diabetic neuropathy, unspecified ([place]) 07/08/2007  . Dyslipidemia associated with type 2 diabetes mellitus ([place]) 07/08/2007    Past Medical History:  Diagnosis Date  . ABDOMINAL PAIN, CHRONIC 10/20/2009  . Acute cystitis 08/17/2009  .  ALLERGIC RHINITIS 11/21/2007  . Allergy   . ASYMPTOMATIC POSTMENOPAUSAL STATUS 09/29/2008  . Bariatric surgery status 07/07/2010  . Bariatric surgery status 07/07/2010   Qualifier: Diagnosis of  By: Loanne Drilling MD, Jacelyn Pi   . Cancer  ([place])    uterine  . Cataract   . Coronary artery disease   . DEPRESSION 11/21/2007  . DIABETES MELLITUS, TYPE II 07/08/2007  . DYSPNEA 05/20/2009  . Edema 05/20/2009  . Eosinophilic esophagitis 03/13/8412  . FEVER UNSPECIFIED 10/20/2009  . FEVER, HX OF 03/09/2010  . GERD (gastroesophageal reflux disease)   . Headache(784.0) 02/06/2010  . Hepatomegaly 01/06/2008  . HYPERLIPIDEMIA 07/08/2007  . HYPERTENSION 07/08/2007  . LEUKOPENIA, MILD 09/29/2008  . OBESITY 01/06/2008  . OSTEOARTHRITIS 01/06/2008  . Other chronic nonalcoholic liver disease 2/44/0102  . PERIPHERAL NEUROPATHY 11/21/2007  . Peripheral neuropathy   . Postsurgical hypothyroidism 09/19/2010  . SINUSITIS- ACUTE-NOS 11/21/2007  . TB SKIN TEST, POSITIVE 02/06/2010  . THYROID NODULE 03/09/2010    Family History  Problem Relation Age of Onset  . Multiple sclerosis Sister   . Breast cancer Sister        breast onset age 45  . Osteoporosis Sister   . Diabetes Mother   . Diabetes Father   . Congestive Heart Failure Father   . Hypertension Father   . Myelodysplastic syndrome Father   . Liver cancer Maternal Aunt   . Breast cancer Maternal Aunt   . Diabetes Maternal Aunt   . Colon cancer Maternal Uncle   . Diabetes Maternal Uncle   . Diabetes Maternal Grandmother   . Diabetes Paternal Grandmother    Past Surgical History:  Procedure Laterality Date  . ABDOMINAL HYSTERECTOMY    . BASAL CELL CARCINOMA EXCISION    . CARDIAC CATHETERIZATION    . COLONOSCOPY    . EYE SURGERY Bilateral 08/2018, 09/2018  . FOOT SURGERY Left    10/2019  . KNEE ARTHROSCOPY Left   . LEFT HEART CATH AND CORONARY ANGIOGRAPHY N/A 01/20/2019   Procedure: LEFT HEART CATH AND CORONARY ANGIOGRAPHY;  Surgeon: Belva Crome, MD;  Location: [facility];  Service: Cardiovascular;  Laterality: N/A;  . LYMPH NODE DISSECTION    . OOPHORECTOMY    . Right total knee replacement    . ROUX-EN-Y GASTRIC BYPASS  2011  . SKIN TAG REMOVAL    . THYROIDECTOMY    .  TONSILLECTOMY AND ADENOIDECTOMY    . UPPER GASTROINTESTINAL ENDOSCOPY     Social History   Social History Narrative  . Not on file   Immunization History  Administered Date(s) Administered  . Fluad Quad(high Dose 65+) 07/13/2019  . Influenza Inj Mdck Quad Pf 10/19/2017, 07/23/2018  . Influenza Whole 07/30/2008, 08/17/2009, 09/19/2010  . Influenza,inj,Quad PF,6-35 Mos 07/14/2015  . Influenza-Unspecified 10/13/2011, 08/12/2013, 07/13/2017, 07/23/2018  . PFIZER SARS-COV-2 Vaccination 12/21/2019, 01/14/2020  . Pneumococcal Conjugate-13 10/14/2015  . Pneumococcal Polysaccharide-23 06/25/2014  . Zoster 06/25/2014     Objective: Vital Signs: BP 95/63 (BP Location: Left Arm, Patient Position: Sitting, Cuff Size: Normal)   Pulse 72   Resp 16   Ht 5' 11"  (1.803 m)   Wt 163 lb (73.9 kg)   BMI 22.73 kg/m    Physical Exam Vitals and nursing note reviewed.  Constitutional:      Appearance: She is well-developed.  HENT:     Head: Normocephalic and atraumatic.  Eyes:     Conjunctiva/sclera: Conjunctivae normal.  Cardiovascular:     Rate and Rhythm: Normal  rate and regular rhythm.     Heart sounds: Normal heart sounds.  Pulmonary:     Effort: Pulmonary effort is normal.     Breath sounds: Normal breath sounds.  Abdominal:     General: Bowel sounds are normal.     Palpations: Abdomen is soft.  Musculoskeletal:     Cervical back: Normal range of motion.  Lymphadenopathy:     Cervical: No cervical adenopathy.  Skin:    General: Skin is warm and dry.     Capillary Refill: Capillary refill takes less than 2 seconds.  Neurological:     Mental Status: She is alert and oriented to person, place, and time.  Psychiatric:        Behavior: Behavior normal.      Musculoskeletal Exam: C-spine thoracic and lumbar spine were in good range of motion.  Shoulder joints and elbow joints in good range of motion.  She has bilateral PIP DIP and CMC thickening with no synovitis.  She had right  knee joint replaced with some warmth.  Left knee joint was in good range of motion.  She continues to have some discomfort in her feet.  No synovitis was noted.  CDAI Exam: CDAI Score: -- Patient Global: --; Provider Global: -- Swollen: --; Tender: -- Joint Exam 02/10/2020   No joint exam has been documented for this visit   There is currently no information documented on the homunculus. Go to the Rheumatology activity and complete the homunculus joint exam.  Investigation: No additional findings.  Imaging: DG Foot Complete Left  Result Date: 02/01/2020 Please see detailed radiograph report in office note.   Recent Labs: Lab Results  Component Value Date   WBC 4.3 01/09/2019   HGB 13.8 01/09/2019   PLT 180 01/09/2019   NA 141 01/14/2020   K 4.7 01/14/2020   CL 106 01/14/2020   CO2 28 01/14/2020   GLUCOSE 137 (H) 01/14/2020   BUN 23 01/14/2020   CREATININE 1.06 01/14/2020   BILITOT 0.4 03/21/2018   ALKPHOS 79 01/11/2017   AST 19 03/21/2018   ALT 23 03/21/2018   PROT 6.3 03/21/2018   PROT 6.7 03/21/2018   ALBUMIN 3.9 01/11/2017   CALCIUM 9.2 01/14/2020   GFRAA 63 07/06/2019    Speciality Comments: No specialty comments available.  Procedures:  No procedures performed Allergies: Ace inhibitors, Caffeine, Ciprofloxacin, Morphine and related, Mucinex [guaifenesin er], Nsaids, and Other   Assessment / Plan:     Visit Diagnoses: Inflammatory arthritis - RF-, anti-CCP-, 14-3-3 eta-, ANA-, HLAB27-, uric acid 3.8.  Patient may have pseudogout.  She had no synovitis on examination today.  She has been doing well on mitigare 0.76m p.o. daily.  Primary osteoarthritis of both hands-she has osteoarthritis in her hands which causes discomfort.  Joint protection muscle strengthening was discussed.  Use of CMC brace was discussed.  Primary osteoarthritis of left knee-chronic pain.  Status post total right knee replacement-she has some warmth on palpation but not much  discomfort.  Primary osteoarthritis of both feet - She has been seeing Dr. TGershon Mussel and she has had repeated cortisone injections (7+ injections according to the patient).    Other medical problems are listed as follows:  History of gastroesophageal reflux (GERD)  Status post gastric bypass for obesity  Eosinophilic esophagitis  History of diabetes mellitus, type II  Positive PPD  NASH (nonalcoholic steatohepatitis)  History of uterine cancer  Postsurgical hypothyroidism  History of hyperlipidemia  History of peripheral neuropathy  Orders: Orders  Placed This Encounter  Procedures  . CBC with Differential/Platelet  . COMPLETE METABOLIC PANEL WITH GFR   No orders of the defined types were placed in this encounter.   .  Follow-Up Instructions: Return in about 6 months (around 08/11/2020) for Inflammatory arthritis, osteoarthritis.   Bo Merino, MD  Note - This record has been created using Editor, commissioning.  Chart creation errors have been sought, but may not always  have been located. Such creation errors do not reflect on  the standard of medical care.

## 2020-02-05 ENCOUNTER — Telehealth: Payer: Self-pay | Admitting: *Deleted

## 2020-02-05 DIAGNOSIS — S92002A Unspecified fracture of left calcaneus, initial encounter for closed fracture: Secondary | ICD-10-CM

## 2020-02-05 DIAGNOSIS — Z9889 Other specified postprocedural states: Secondary | ICD-10-CM

## 2020-02-05 NOTE — Telephone Encounter (Signed)
Pt states being treated for heel fracture, she has been out of the boot since 02/01/2020 and the left leg is shorter than the other, causing the right hip to hurt, what doctor should she see or should she have PT.

## 2020-02-05 NOTE — Telephone Encounter (Signed)
Please order physical therapy 2x/week x 6 weeks. She may just need some physical therapy and rehab. She has been in a boot for several months. Thanks, Dr. Amalia Hailey

## 2020-02-08 NOTE — Telephone Encounter (Signed)
I informed pt of Dr. Amalia Hailey recommendations for PT. Pt requested to begin PT at home due to holidays and the discomfort. Delivered PT orders to [facility].

## 2020-02-10 ENCOUNTER — Other Ambulatory Visit: Payer: Self-pay

## 2020-02-10 ENCOUNTER — Encounter: Payer: Self-pay | Admitting: Rheumatology

## 2020-02-10 ENCOUNTER — Ambulatory Visit (INDEPENDENT_AMBULATORY_CARE_PROVIDER_SITE_OTHER): Payer: Medicare Other | Admitting: Rheumatology

## 2020-02-10 VITALS — BP 95/63 | HR 72 | Resp 16 | Ht 71.0 in | Wt 163.0 lb

## 2020-02-10 DIAGNOSIS — K200 Eosinophilic esophagitis: Secondary | ICD-10-CM

## 2020-02-10 DIAGNOSIS — M25672 Stiffness of left ankle, not elsewhere classified: Secondary | ICD-10-CM | POA: Diagnosis not present

## 2020-02-10 DIAGNOSIS — M19071 Primary osteoarthritis, right ankle and foot: Secondary | ICD-10-CM | POA: Diagnosis not present

## 2020-02-10 DIAGNOSIS — R269 Unspecified abnormalities of gait and mobility: Secondary | ICD-10-CM | POA: Diagnosis not present

## 2020-02-10 DIAGNOSIS — K7581 Nonalcoholic steatohepatitis (NASH): Secondary | ICD-10-CM

## 2020-02-10 DIAGNOSIS — M1990 Unspecified osteoarthritis, unspecified site: Secondary | ICD-10-CM | POA: Diagnosis not present

## 2020-02-10 DIAGNOSIS — Z8669 Personal history of other diseases of the nervous system and sense organs: Secondary | ICD-10-CM

## 2020-02-10 DIAGNOSIS — M6259 Muscle wasting and atrophy, not elsewhere classified, multiple sites: Secondary | ICD-10-CM | POA: Diagnosis not present

## 2020-02-10 DIAGNOSIS — R7611 Nonspecific reaction to tuberculin skin test without active tuberculosis: Secondary | ICD-10-CM | POA: Diagnosis not present

## 2020-02-10 DIAGNOSIS — Z8719 Personal history of other diseases of the digestive system: Secondary | ICD-10-CM

## 2020-02-10 DIAGNOSIS — M1712 Unilateral primary osteoarthritis, left knee: Secondary | ICD-10-CM

## 2020-02-10 DIAGNOSIS — Z5181 Encounter for therapeutic drug level monitoring: Secondary | ICD-10-CM | POA: Diagnosis not present

## 2020-02-10 DIAGNOSIS — E890 Postprocedural hypothyroidism: Secondary | ICD-10-CM

## 2020-02-10 DIAGNOSIS — Z8542 Personal history of malignant neoplasm of other parts of uterus: Secondary | ICD-10-CM

## 2020-02-10 DIAGNOSIS — Z9884 Bariatric surgery status: Secondary | ICD-10-CM | POA: Diagnosis not present

## 2020-02-10 DIAGNOSIS — M25572 Pain in left ankle and joints of left foot: Secondary | ICD-10-CM | POA: Diagnosis not present

## 2020-02-10 DIAGNOSIS — Z96651 Presence of right artificial knee joint: Secondary | ICD-10-CM | POA: Diagnosis not present

## 2020-02-10 DIAGNOSIS — M25551 Pain in right hip: Secondary | ICD-10-CM | POA: Diagnosis not present

## 2020-02-10 DIAGNOSIS — M19042 Primary osteoarthritis, left hand: Secondary | ICD-10-CM

## 2020-02-10 DIAGNOSIS — E118 Type 2 diabetes mellitus with unspecified complications: Secondary | ICD-10-CM | POA: Diagnosis not present

## 2020-02-10 DIAGNOSIS — Z8639 Personal history of other endocrine, nutritional and metabolic disease: Secondary | ICD-10-CM | POA: Diagnosis not present

## 2020-02-10 DIAGNOSIS — M19041 Primary osteoarthritis, right hand: Secondary | ICD-10-CM | POA: Diagnosis not present

## 2020-02-10 DIAGNOSIS — M19072 Primary osteoarthritis, left ankle and foot: Secondary | ICD-10-CM

## 2020-02-10 DIAGNOSIS — M25472 Effusion, left ankle: Secondary | ICD-10-CM | POA: Diagnosis not present

## 2020-02-10 LAB — COMPLETE METABOLIC PANEL WITH GFR
AG Ratio: 1.9 (calc) (ref 1.0–2.5)
ALT: 27 U/L (ref 6–29)
AST: 25 U/L (ref 10–35)
Albumin: 3.7 g/dL (ref 3.6–5.1)
Alkaline phosphatase (APISO): 96 U/L (ref 37–153)
BUN/Creatinine Ratio: 19 (calc) (ref 6–22)
BUN: 20 mg/dL (ref 7–25)
CO2: 26 mmol/L (ref 20–32)
Calcium: 8.9 mg/dL (ref 8.6–10.4)
Chloride: 108 mmol/L (ref 98–110)
Creat: 1.06 mg/dL — ABNORMAL HIGH (ref 0.50–0.99)
GFR, Est African American: 64 mL/min/{1.73_m2} (ref >=60)
GFR, Est Non African American: 55 mL/min/{1.73_m2} — ABNORMAL LOW (ref >=60)
Globulin: 1.9 g/dL (calc) (ref 1.9–3.7)
Glucose, Bld: 157 mg/dL — ABNORMAL HIGH (ref 65–99)
Potassium: 4.6 mmol/L (ref 3.5–5.3)
Sodium: 140 mmol/L (ref 135–146)
Total Bilirubin: 0.4 mg/dL (ref 0.2–1.2)
Total Protein: 5.6 g/dL — ABNORMAL LOW (ref 6.1–8.1)

## 2020-02-10 LAB — CBC WITH DIFFERENTIAL/PLATELET
Absolute Monocytes: 228 cells/uL (ref 200–950)
Basophils Absolute: 21 cells/uL (ref 0–200)
Basophils Relative: 0.9 %
Eosinophils Absolute: 90 cells/uL (ref 15–500)
Eosinophils Relative: 3.9 %
HCT: 36.6 % (ref 35.0–45.0)
Hemoglobin: 11.6 g/dL — ABNORMAL LOW (ref 11.7–15.5)
Lymphs Abs: 1007 cells/uL (ref 850–3900)
MCH: 30.9 pg (ref 27.0–33.0)
MCHC: 31.7 g/dL — ABNORMAL LOW (ref 32.0–36.0)
MCV: 97.3 fL (ref 80.0–100.0)
MPV: 11.8 fL (ref 7.5–12.5)
Monocytes Relative: 9.9 %
Neutro Abs: 955 cells/uL — ABNORMAL LOW (ref 1500–7800)
Neutrophils Relative %: 41.5 %
Platelets: 175 10*3/uL (ref 140–400)
RBC: 3.76 10*6/uL — ABNORMAL LOW (ref 3.80–5.10)
RDW: 14.5 % (ref 11.0–15.0)
Total Lymphocyte: 43.8 %
WBC: 2.3 10*3/uL — ABNORMAL LOW (ref 3.8–10.8)

## 2020-02-11 ENCOUNTER — Telehealth: Payer: Self-pay | Admitting: *Deleted

## 2020-02-11 ENCOUNTER — Ambulatory Visit (HOSPITAL_COMMUNITY)
Admission: RE | Admit: 2020-02-11 | Discharge: 2020-02-11 | Disposition: A | Discharge status: Home / Self Care | Payer: Medicare Other | Source: Ambulatory Visit | Attending: Cardiology | Admitting: Cardiology

## 2020-02-11 ENCOUNTER — Other Ambulatory Visit: Payer: Self-pay

## 2020-02-11 DIAGNOSIS — R29898 Other symptoms and signs involving the musculoskeletal system: Secondary | ICD-10-CM | POA: Diagnosis not present

## 2020-02-11 DIAGNOSIS — D708 Other neutropenia: Secondary | ICD-10-CM

## 2020-02-11 NOTE — Progress Notes (Signed)
I called patient to discuss lab results.  She has had neutropenia in the past but now the white cell count is low.  It is unusual to have neutropenia with colchicine although I discussed stopping colchicine and rechecking WBC count in 2 weeks.  She does not want to stop colchicine.  She states her joints to start swelling when she is off colchicine.  She would prefer a hematology referral.  Please refer her to hematology for neutropenia.

## 2020-02-11 NOTE — Telephone Encounter (Signed)
-----   Message from Bo Merino, MD sent at 02/11/2020 12:34 PM EDT ----- I called patient to discuss lab results.  She has had neutropenia in the past but now the white cell count is low.  It is unusual to have neutropenia with colchicine although I discussed stopping colchicine and rechecking WBC count in 2 weeks.  She d oes not want to stop colchicine.  She states her joints to start swelling when she is off colchicine.  She would prefer a hematology referral.  Please refer her to hematology for neutropenia.

## 2020-02-12 ENCOUNTER — Telehealth: Payer: Self-pay | Admitting: Physician Assistant

## 2020-02-12 NOTE — Telephone Encounter (Signed)
Received a new hem referral from Dr. Estanislado Pandy for neutropenia. Ms. Savannah [name] has been cld and schedule to see [name] on 4/19 at 1:30pm w/labs at 1pm. Pt aware to arrive 15 minutes early.

## 2020-02-22 ENCOUNTER — Ambulatory Visit: Payer: Medicare Other | Admitting: Internal Medicine

## 2020-02-25 DIAGNOSIS — M6259 Muscle wasting and atrophy, not elsewhere classified, multiple sites: Secondary | ICD-10-CM | POA: Diagnosis not present

## 2020-02-25 DIAGNOSIS — M25551 Pain in right hip: Secondary | ICD-10-CM | POA: Diagnosis not present

## 2020-02-25 DIAGNOSIS — M25672 Stiffness of left ankle, not elsewhere classified: Secondary | ICD-10-CM | POA: Diagnosis not present

## 2020-02-25 DIAGNOSIS — M25472 Effusion, left ankle: Secondary | ICD-10-CM | POA: Diagnosis not present

## 2020-02-25 DIAGNOSIS — M25572 Pain in left ankle and joints of left foot: Secondary | ICD-10-CM | POA: Diagnosis not present

## 2020-02-25 DIAGNOSIS — R269 Unspecified abnormalities of gait and mobility: Secondary | ICD-10-CM | POA: Diagnosis not present

## 2020-02-25 DIAGNOSIS — E118 Type 2 diabetes mellitus with unspecified complications: Secondary | ICD-10-CM | POA: Diagnosis not present

## 2020-02-26 ENCOUNTER — Encounter (HOSPITAL_COMMUNITY): Payer: Self-pay | Admitting: *Deleted

## 2020-02-26 ENCOUNTER — Emergency Department (HOSPITAL_COMMUNITY): Payer: Medicare Other

## 2020-02-26 ENCOUNTER — Emergency Department (HOSPITAL_COMMUNITY)
Admission: EM | Admit: 2020-02-26 | Discharge: 2020-02-27 | Disposition: A | Discharge status: Home / Self Care | Payer: Medicare Other | Attending: Emergency Medicine | Admitting: Emergency Medicine

## 2020-02-26 ENCOUNTER — Other Ambulatory Visit: Payer: Self-pay

## 2020-02-26 DIAGNOSIS — R2243 Localized swelling, mass and lump, lower limb, bilateral: Secondary | ICD-10-CM | POA: Diagnosis not present

## 2020-02-26 DIAGNOSIS — R600 Localized edema: Secondary | ICD-10-CM

## 2020-02-26 DIAGNOSIS — Z7984 Long term (current) use of oral hypoglycemic drugs: Secondary | ICD-10-CM | POA: Diagnosis not present

## 2020-02-26 DIAGNOSIS — R0602 Shortness of breath: Secondary | ICD-10-CM | POA: Insufficient documentation

## 2020-02-26 DIAGNOSIS — E1142 Type 2 diabetes mellitus with diabetic polyneuropathy: Secondary | ICD-10-CM | POA: Insufficient documentation

## 2020-02-26 DIAGNOSIS — M79605 Pain in left leg: Secondary | ICD-10-CM | POA: Insufficient documentation

## 2020-02-26 DIAGNOSIS — R0789 Other chest pain: Secondary | ICD-10-CM | POA: Insufficient documentation

## 2020-02-26 DIAGNOSIS — I119 Hypertensive heart disease without heart failure: Secondary | ICD-10-CM | POA: Insufficient documentation

## 2020-02-26 DIAGNOSIS — Z79899 Other long term (current) drug therapy: Secondary | ICD-10-CM | POA: Diagnosis not present

## 2020-02-26 DIAGNOSIS — R072 Precordial pain: Secondary | ICD-10-CM

## 2020-02-26 DIAGNOSIS — M79662 Pain in left lower leg: Secondary | ICD-10-CM | POA: Diagnosis not present

## 2020-02-26 DIAGNOSIS — I2510 Atherosclerotic heart disease of native coronary artery without angina pectoris: Secondary | ICD-10-CM | POA: Insufficient documentation

## 2020-02-26 NOTE — ED Provider Notes (Signed)
[facility] DEPT Provider Note   CSN: [id number] Arrival date & time: 02/26/20  2030     History Chief Complaint  Patient presents with  . Leg Pain    Savannah [name] is a 64 y.o. female.  The history is provided by the patient.  Leg Pain Location:  Leg Injury: no   Leg location:  L lower leg Pain details:    Quality:  Burning   Radiates to:  Does not radiate   Severity:  Severe   Onset quality:  Sudden   Timing:  Constant   Progression:  Improving Chronicity:  New Dislocation: no   Relieved by:  Nothing Worsened by:  Flexion Associated symptoms: no fever   Patient with history of CAD, depression, diabetes presents with sudden onset of left leg pain that started several hours ago.  Patient reports she was standing up when she had sudden onset of pain that was burning in nature in her left calf with bruising and swelling.  No trauma.  She does report recent increased physical activity with treadmill use.  She also reports recent long distance travel when she drove to [place] recently. No history of VTE  Patient reports while she was driving to the ER she began having left lateral chest pain.  She reports mild shortness of breath.  No fevers or cough.  No pleuritic pain.  She reports it feels similar to previous chest pain from angina.  She has nitroglycerin at home which she did not use     Past Medical History:  Diagnosis Date  . ABDOMINAL PAIN, CHRONIC 10/20/2009  . Acute cystitis 08/17/2009  . ALLERGIC RHINITIS 11/21/2007  . Allergy   . ASYMPTOMATIC POSTMENOPAUSAL STATUS 09/29/2008  . Bariatric surgery status 07/07/2010  . Bariatric surgery status 07/07/2010   Qualifier: Diagnosis of  By: Loanne Drilling MD, Jacelyn Pi   . Cancer ([place])    uterine  . Cataract   . Coronary artery disease   . DEPRESSION 11/21/2007  . DIABETES MELLITUS, TYPE II 07/08/2007  . DYSPNEA 05/20/2009  . Edema 05/20/2009  . Eosinophilic esophagitis 04/15/4033  . FEVER UNSPECIFIED  10/20/2009  . FEVER, HX OF 03/09/2010  . GERD (gastroesophageal reflux disease)   . Headache(784.0) 02/06/2010  . Hepatomegaly 01/06/2008  . HYPERLIPIDEMIA 07/08/2007  . HYPERTENSION 07/08/2007  . LEUKOPENIA, MILD 09/29/2008  . OBESITY 01/06/2008  . OSTEOARTHRITIS 01/06/2008  . Other chronic nonalcoholic liver disease 7/42/5956  . PERIPHERAL NEUROPATHY 11/21/2007  . Peripheral neuropathy   . Postsurgical hypothyroidism 09/19/2010  . SINUSITIS- ACUTE-NOS 11/21/2007  . TB SKIN TEST, POSITIVE 02/06/2010  . THYROID NODULE 03/09/2010    Patient Active Problem List   Diagnosis Date Noted  . Breast lump 09/02/2019  . Lesion of vulva 09/02/2019  . Menorrhagia 09/02/2019  . Neuropathy 09/02/2019  . Abdominal pain, epigastric 06/25/2019  . Irregular bowel habits 06/25/2019  . Coronary artery disease involving native coronary artery of native heart without angina pectoris   . Temporomandibular joint disorder 12/05/2018  . Fracture of upper limb 12/05/2018  . Fatigue 12/05/2018  . Inverted nipple 12/05/2018  . Senile purpura ([place]) 11/21/2018  . GERD with esophagitis 11/21/2018  . Pseudogout involving multiple joints 11/21/2018  . Lipoma 07/22/2017  . Lichen sclerosus 38/75/6433  . H/O gastric bypass 11/16/2016  . GERD (gastroesophageal reflux disease) 11/16/2016  . Depression 11/16/2016  . Diabetic polyneuropathy associated with type 2 diabetes mellitus ([place]) 11/16/2016  . Hyperlipidemia 11/16/2016  . Left lower quadrant pain 11/16/2016  .  Uterine cancer [facility]) s/p hysterectomy 1997 01/04/2012  . Postsurgical hypothyroidism 09/19/2010  . Generalized abdominal pain 10/20/2009  . Diabetic neuropathy ([place]) 11/21/2007  . Allergic rhinitis 11/21/2007  . Type 2 diabetes mellitus with diabetic neuropathy, unspecified ([place]) 07/08/2007  . Dyslipidemia associated with type 2 diabetes mellitus ([place]) 07/08/2007    Past Surgical History:  Procedure Laterality Date  . ABDOMINAL HYSTERECTOMY    . BASAL  CELL CARCINOMA EXCISION    . CARDIAC CATHETERIZATION    . COLONOSCOPY    . EYE SURGERY Bilateral 08/2018, 09/2018  . FOOT SURGERY Left    10/2019  . KNEE ARTHROSCOPY Left   . LEFT HEART CATH AND CORONARY ANGIOGRAPHY N/A 01/20/2019   Procedure: LEFT HEART CATH AND CORONARY ANGIOGRAPHY;  Surgeon: Belva Crome, MD;  Location: [facility];  Service: Cardiovascular;  Laterality: N/A;  . LYMPH NODE DISSECTION    . OOPHORECTOMY    . Right total knee replacement    . ROUX-EN-Y GASTRIC BYPASS  2011  . SKIN TAG REMOVAL    . THYROIDECTOMY    . TONSILLECTOMY AND ADENOIDECTOMY    . UPPER GASTROINTESTINAL ENDOSCOPY       OB History   No obstetric history on file.     Family History  Problem Relation Age of Onset  . Multiple sclerosis Sister   . Breast cancer Sister        breast onset age 74  . Osteoporosis Sister   . Diabetes Mother   . Diabetes Father   . Congestive Heart Failure Father   . Hypertension Father   . Myelodysplastic syndrome Father   . Liver cancer Maternal Aunt   . Breast cancer Maternal Aunt   . Diabetes Maternal Aunt   . Colon cancer Maternal Uncle   . Diabetes Maternal Uncle   . Diabetes Maternal Grandmother   . Diabetes Paternal Grandmother     Social History   Tobacco Use  . Smoking status: Former Smoker    Packs/day: 1.50    Years: 15.00    Pack years: 22.50    Types: Cigarettes  . Smokeless tobacco: Never Used  Substance Use Topics  . Alcohol use: Yes    Alcohol/week: 0.0 standard drinks    Comment: rarely  . Drug use: Never    Home Medications Prior to Admission medications   Medication Sig Start Date End Date Taking? Authorizing Provider  aspirin EC 81 MG tablet Take 1 tablet (81 mg total) by mouth daily. Starting 01/18/19 prior to cardiac cath 01/18/19  Yes Elouise Munroe, MD  atorvastatin (LIPITOR) 40 MG tablet Take 1 tablet (40 mg total) by mouth daily. 12/03/19 03/02/20 Yes Elouise Munroe, MD  Biotin-D POWD Take 1 tablet by  mouth daily.   Yes [provider]  canagliflozin (INVOKANA) 300 MG TABS tablet Take 1 tablet (300 mg total) by mouth daily before breakfast. 01/14/20  Yes Renato Shin, MD  Cholecalciferol (VITAMIN D3) 125 MCG (5000 UT) CAPS Take 5,000 Units by mouth daily.   Yes [provider]  desloratadine (CLARINEX) 5 MG tablet Take 5 mg by mouth daily.   Yes [provider]  diclofenac sodium (VOLTAREN) 1 % GEL Apply 4 g topically 4 (four) times daily. Patient taking differently: Apply 4 g topically as needed (pain).  09/06/17  Yes Renato Shin, MD  docusate sodium (COLACE) 100 MG capsule Take 200 mg by mouth 2 (two) times daily.   Yes [provider]  Dulaglutide (TRULICITY) 1.5  MG/0.5ML SOPN Inject 1.5 mg into the skin once a week. 02/01/20  Yes Renato Shin, MD  furosemide (LASIX) 20 MG tablet Take 1 tablet (20 mg total) by mouth daily. Patient taking differently: Take 20 mg by mouth daily as needed for fluid.  06/26/19 02/26/20 Yes Elouise Munroe, MD  halobetasol (ULTRAVATE) 0.05 % cream Apply 1 application topically 2 (two) times daily as needed (lichen sclerosus).  01/26/17  Yes [provider]  HYDROcodone-acetaminophen (NORCO/VICODIN) 5-325 MG tablet Take 1 tablet by mouth daily as needed for moderate pain or severe pain.   Yes [provider]  levothyroxine (SYNTHROID) 112 MCG tablet TAKE 1 TABLET BY MOUTH EVERY DAY Patient taking differently: Take 112 mcg by mouth daily before breakfast.  11/26/19  Yes Renato Shin, MD  MAGNESIUM PO Take 1 tablet by mouth daily as needed (cramps).    Yes [provider]  metFORMIN (GLUCOPHAGE-XR) 500 MG 24 hr tablet Take 1 tablet (500 mg total) by mouth 2 (two) times daily. Patient taking differently: Take 1,000 mg by mouth 2 (two) times daily.  07/21/19  Yes Renato Shin, MD  MITIGARE 0.6 MG CAPS Take 0.6 mg by mouth daily. 12/07/19  Yes Deveshwar, Abel Presto, MD  Multiple Vitamin (MULTIVITAMIN) tablet Take  1 tablet by mouth daily.    Yes [provider]  nitroGLYCERIN (NITROSTAT) 0.4 MG SL tablet Place 1 tablet (0.4 mg total) under the tongue every 5 (five) minutes as needed. 11/28/18  Yes Elouise Munroe, MD  pantoprazole (PROTONIX) 40 MG tablet TAKE 1 TABLET BY MOUTH TWICE A DAY Patient taking differently: Take 40 mg by mouth 2 (two) times daily.  11/03/19  Yes Zehr, Laban Emperor, PA-C  phenazopyridine (PYRIDIUM) 100 MG tablet Take 100 mg by mouth 3 (three) times daily as needed for pain.   Yes [provider]  Probiotic CAPS Take 1 capsule by mouth daily.   Yes [provider]  repaglinide (PRANDIN) 2 MG tablet Take 1 tablet (2 mg total) by mouth 2 (two) times daily before a meal. 01/14/20  Yes Renato Shin, MD  traMADol (ULTRAM) 50 MG tablet Take 50 mg by mouth 2 (two) times daily as needed for moderate pain.   Yes [provider]  TURMERIC CURCUMIN PO Take 1 capsule by mouth daily.   Yes [provider]  vitamin B-12 (CYANOCOBALAMIN) 1000 MCG tablet Take 3,000-4,000 mcg by mouth daily.   Yes [provider]  glucose blood test strip 1 each by Other route 3 (three) times daily. Use to monitor glucose levels TID; E11.40 08/11/19   Renato Shin, MD  oxyCODONE-acetaminophen (PERCOCET) 5-325 MG tablet Take 1 tablet by mouth every 6 (six) hours as needed for severe pain. Patient not taking: Reported on 02/10/2020 10/22/19   Edrick Kins, DPM  prochlorperazine (COMPAZINE) 10 MG tablet Take 1 tablet (10 mg total) by mouth every 6 (six) hours as needed (migraine). Take with imitrex and benadryl Patient not taking: Reported on 02/10/2020 11/03/19   Vivi Barrack, MD  SUMAtriptan (IMITREX) 25 MG tablet Take 1 tablet (25 mg total) by mouth every 2 (two) hours as needed for migraine. Take with benadryl and compazine Patient not taking: Reported on 02/10/2020 11/03/19   Vivi Barrack, MD    Allergies    Ace inhibitors, Caffeine, Ciprofloxacin, Morphine  and related, Mucinex [guaifenesin er], Nsaids, and Other  Review of Systems   Review of Systems  Constitutional: Negative for fever.  Respiratory: Positive for shortness of  breath.   Cardiovascular: Positive for chest pain and leg swelling.  Gastrointestinal: Negative for vomiting.  Hematological: Bruises/bleeds easily.  All other systems reviewed and are negative.   Physical Exam Updated Vital Signs BP 122/80 (BP Location: Left Arm)   Pulse 69   Temp 97.7 F (36.5 C) (Oral)   Resp 18   Ht 1.803 m (5' 11" )   Wt 72.6 kg   SpO2 100%   BMI 22.32 kg/m   Physical Exam CONSTITUTIONAL: Well developed/well nourished HEAD: Normocephalic/atraumatic EYES: EOMI ENMT: Mask in place NECK: supple no meningeal signs SPINE/BACK:entire spine nontender CV: S1/S2 noted, no murmurs/rubs/gallops noted LUNGS: Lungs are clear to auscultation bilaterally, no apparent distress ABDOMEN: soft, nontender, no rebound or guarding, bowel sounds noted throughout abdomen GU:no cva tenderness NEURO: Pt is awake/alert/appropriate, moves all extremitiesx4.  No facial droop.  EXTREMITIES: pulses normal/equal, full ROM, area of bruising, mild erythema, tenderness to left calf.  No crepitus.  Distal pulses equal and intact There is pitting edema to left lower extremity greater than right.  Patient reports this is chronic SKIN: warm, color normal, bruising to upper extremities (chronic) PSYCH: no abnormalities of mood noted, alert and oriented to situation  ED Results / Procedures / Treatments   Labs (all labs ordered are listed, but only abnormal results are displayed) Labs Reviewed  BASIC METABOLIC PANEL - Abnormal; Notable for the following components:      Result Value   Glucose, Bld 119 (*)    Calcium 8.6 (*)    All other components within normal limits  CBC WITH DIFFERENTIAL/PLATELET - Abnormal; Notable for the following components:   WBC 3.4 (*)    RBC 3.54 (*)    Hemoglobin 10.8 (*)    HCT 35.9  (*)    MCV 101.4 (*)    Neutro Abs 1.6 (*)    All other components within normal limits  TROPONIN I (HIGH SENSITIVITY)  TROPONIN I (HIGH SENSITIVITY)    EKG EKG Interpretation  Date/Time:  Friday February 26 2020 20:57:47 EDT Ventricular Rate:  73 PR Interval:    QRS Duration: 86 QT Interval:  389 QTC Calculation: 429 R Axis:   55 Text Interpretation: Sinus rhythm No significant change since last tracing Confirmed by Ripley Fraise [phone]) on 02/26/2020 11:06:05 PM   Radiology DG Chest 2 View  Result Date: 02/26/2020 CLINICAL DATA:  Shortness of breath. EXAM: CHEST - 2 VIEW COMPARISON:  Radiograph 618 FINDINGS: The cardiomediastinal contours are normal. The lungs are clear. Pulmonary vasculature is normal. No consolidation, pleural effusion, or pneumothorax. No acute osseous abnormalities are seen. Surgical clips at the thoracic inlet. IMPRESSION: No acute chest findings. Electronically Signed   By: Keith Rake M.D.   On: 02/26/2020 21:34    Procedures Procedures  Medications Ordered in ED Medications - No data to display  ED Course  I have reviewed the triage vital signs and the nursing notes.  Pertinent labs & imaging results that were available during my care of the patient were reviewed by me and considered in my medical decision making (see chart for details).    MDM Rules/Calculators/A&P                      11:44 PM Patient presents with left leg pain and chest pain. For left leg pain, patient require DVT ultrasound.  This not available till later tomorrow morning.  Patient does not want anticoagulation preprocedure. For her chest pain, this came on while she was  driving to ER.  She reports it feels similar to angina.  No acute EKG changes.  Labs been ordered Pt declines pain meds and declines NTG  2:48 AM Patient stable.  No new chest pain.  She is resting comfortably reading a magazine. Repeat troponin is unremarkable.  Low suspicion for ACS/PE at this  time She will return later this morning for an outpatient DVT study.  We discussed return precautions   This patient presents to the ED for concern of leg pain and chest pain, this involves an extensive number of treatment options, and is a complaint that carries with it a high risk of complications and morbidity.  The differential diagnosis includes PE, ACS, Dissection   Lab Tests:   I Ordered, reviewed, and interpreted labs, which included troponin, metabolic panel, complete blood count  Imaging Studies ordered:   I ordered imaging studies which included chest x-ray   I independently visualized and interpreted imaging which showed no acute findings  Additional history obtained:    Previous records obtained and reviewed     After the interventions stated above, I reevaluated the patient and found stable, no acute complaint    Final Clinical Impression(s) / ED Diagnoses Final diagnoses:  Precordial pain  Left leg pain  Lower extremity edema    Rx / DC Orders ED Discharge Orders    None       Ripley Fraise, MD 02/27/20 [phone]

## 2020-02-26 NOTE — ED Triage Notes (Signed)
Pt noticed a bruise on inner left lower leg, Swelling noted . (-) Homans sign. Pt went drove 13 hours on Tuesday and another 13 on Wed. She states she worked out today and did exercise her leg.  States she is having SHOB which may be anxiety, she is a heart patient.

## 2020-02-27 ENCOUNTER — Ambulatory Visit (HOSPITAL_BASED_OUTPATIENT_CLINIC_OR_DEPARTMENT_OTHER)
Admission: RE | Admit: 2020-02-27 | Discharge: 2020-02-27 | Disposition: A | Discharge status: Home / Self Care | Payer: Medicare Other | Source: Ambulatory Visit | Attending: Emergency Medicine | Admitting: Emergency Medicine

## 2020-02-27 DIAGNOSIS — R600 Localized edema: Secondary | ICD-10-CM | POA: Diagnosis not present

## 2020-02-27 DIAGNOSIS — M79605 Pain in left leg: Secondary | ICD-10-CM

## 2020-02-27 DIAGNOSIS — R0789 Other chest pain: Secondary | ICD-10-CM | POA: Diagnosis not present

## 2020-02-27 LAB — BASIC METABOLIC PANEL
Anion gap: 6 (ref 5–15)
BUN: 15 mg/dL (ref 8–23)
CO2: 24 mmol/L (ref 22–32)
Calcium: 8.6 mg/dL — ABNORMAL LOW (ref 8.9–10.3)
Chloride: 110 mmol/L (ref 98–111)
Creatinine, Ser: 0.89 mg/dL (ref 0.44–1.00)
GFR calc Af Amer: >60 mL/min (ref >60)
GFR calc non Af Amer: >60 mL/min (ref >60)
Glucose, Bld: 119 mg/dL — ABNORMAL HIGH (ref 70–99)
Potassium: 4.3 mmol/L (ref 3.5–5.1)
Sodium: 140 mmol/L (ref 135–145)

## 2020-02-27 LAB — TROPONIN I (HIGH SENSITIVITY)
Troponin I (High Sensitivity): 4 ng/L (ref <18)
Troponin I (High Sensitivity): 5 ng/L (ref <18)

## 2020-02-27 LAB — CBC WITH DIFFERENTIAL/PLATELET
Abs Immature Granulocytes: 0 10*3/uL (ref 0.00–0.07)
Basophils Absolute: 0 10*3/uL (ref 0.0–0.1)
Basophils Relative: 1 %
Eosinophils Absolute: 0.2 10*3/uL (ref 0.0–0.5)
Eosinophils Relative: 5 %
HCT: 35.9 % — ABNORMAL LOW (ref 36.0–46.0)
Hemoglobin: 10.8 g/dL — ABNORMAL LOW (ref 12.0–15.0)
Immature Granulocytes: 0 %
Lymphocytes Relative: 37 %
Lymphs Abs: 1.3 10*3/uL (ref 0.7–4.0)
MCH: 30.5 pg (ref 26.0–34.0)
MCHC: 30.1 g/dL (ref 30.0–36.0)
MCV: 101.4 fL — ABNORMAL HIGH (ref 80.0–100.0)
Monocytes Absolute: 0.4 10*3/uL (ref 0.1–1.0)
Monocytes Relative: 12 %
Neutro Abs: 1.6 10*3/uL — ABNORMAL LOW (ref 1.7–7.7)
Neutrophils Relative %: 45 %
Platelets: 164 10*3/uL (ref 150–400)
RBC: 3.54 MIL/uL — ABNORMAL LOW (ref 3.87–5.11)
RDW: 15.1 % (ref 11.5–15.5)
WBC: 3.4 10*3/uL — ABNORMAL LOW (ref 4.0–10.5)
nRBC: 0 % (ref 0.0–0.2)

## 2020-02-27 NOTE — ED Notes (Signed)
Pt lying in bed awake and talking. Denies any needs. Will continue to monitor.

## 2020-02-27 NOTE — Discharge Instructions (Addendum)

## 2020-02-27 NOTE — Progress Notes (Signed)
VASCULAR LAB PRELIMINARY  PRELIMINARY  PRELIMINARY  PRELIMINARY  Left lower extremity venous duplex completed.    Preliminary report:  See CV proc for preliminary results.   Jenna Routzahn, RVT 02/27/2020, 11:43 AM

## 2020-02-27 NOTE — ED Notes (Signed)
Pt given a warm blanket 

## 2020-02-27 NOTE — ED Notes (Signed)
Pt sitting up in bed. NAD noted. Pt ready for d/c

## 2020-02-29 ENCOUNTER — Encounter: Payer: Self-pay | Admitting: Physician Assistant

## 2020-02-29 ENCOUNTER — Inpatient Hospital Stay: Payer: Medicare Other

## 2020-02-29 ENCOUNTER — Other Ambulatory Visit: Payer: Self-pay

## 2020-02-29 ENCOUNTER — Other Ambulatory Visit: Payer: Self-pay | Admitting: Physician Assistant

## 2020-02-29 ENCOUNTER — Inpatient Hospital Stay: Payer: Medicare Other | Attending: Physician Assistant | Admitting: Physician Assistant

## 2020-02-29 VITALS — BP 126/61 | HR 90 | Temp 98.2°F | Resp 20 | Ht 71.0 in | Wt 170.8 lb

## 2020-02-29 DIAGNOSIS — Z79899 Other long term (current) drug therapy: Secondary | ICD-10-CM | POA: Diagnosis not present

## 2020-02-29 DIAGNOSIS — D759 Disease of blood and blood-forming organs, unspecified: Secondary | ICD-10-CM

## 2020-02-29 DIAGNOSIS — R1084 Generalized abdominal pain: Secondary | ICD-10-CM

## 2020-02-29 DIAGNOSIS — D539 Nutritional anemia, unspecified: Secondary | ICD-10-CM | POA: Insufficient documentation

## 2020-02-29 DIAGNOSIS — Z85828 Personal history of other malignant neoplasm of skin: Secondary | ICD-10-CM | POA: Insufficient documentation

## 2020-02-29 DIAGNOSIS — Z8542 Personal history of malignant neoplasm of other parts of uterus: Secondary | ICD-10-CM | POA: Insufficient documentation

## 2020-02-29 DIAGNOSIS — Z90721 Acquired absence of ovaries, unilateral: Secondary | ICD-10-CM | POA: Insufficient documentation

## 2020-02-29 DIAGNOSIS — D649 Anemia, unspecified: Secondary | ICD-10-CM | POA: Insufficient documentation

## 2020-02-29 DIAGNOSIS — I2510 Atherosclerotic heart disease of native coronary artery without angina pectoris: Secondary | ICD-10-CM | POA: Insufficient documentation

## 2020-02-29 DIAGNOSIS — Z7984 Long term (current) use of oral hypoglycemic drugs: Secondary | ICD-10-CM | POA: Diagnosis not present

## 2020-02-29 DIAGNOSIS — E119 Type 2 diabetes mellitus without complications: Secondary | ICD-10-CM | POA: Insufficient documentation

## 2020-02-29 DIAGNOSIS — E785 Hyperlipidemia, unspecified: Secondary | ICD-10-CM | POA: Insufficient documentation

## 2020-02-29 DIAGNOSIS — Z833 Family history of diabetes mellitus: Secondary | ICD-10-CM | POA: Insufficient documentation

## 2020-02-29 DIAGNOSIS — D709 Neutropenia, unspecified: Secondary | ICD-10-CM

## 2020-02-29 DIAGNOSIS — Z87891 Personal history of nicotine dependence: Secondary | ICD-10-CM | POA: Insufficient documentation

## 2020-02-29 DIAGNOSIS — Z96651 Presence of right artificial knee joint: Secondary | ICD-10-CM | POA: Diagnosis not present

## 2020-02-29 DIAGNOSIS — Z8249 Family history of ischemic heart disease and other diseases of the circulatory system: Secondary | ICD-10-CM | POA: Insufficient documentation

## 2020-02-29 DIAGNOSIS — R160 Hepatomegaly, not elsewhere classified: Secondary | ICD-10-CM | POA: Insufficient documentation

## 2020-02-29 DIAGNOSIS — Z791 Long term (current) use of non-steroidal anti-inflammatories (NSAID): Secondary | ICD-10-CM | POA: Diagnosis not present

## 2020-02-29 DIAGNOSIS — Z114 Encounter for screening for human immunodeficiency virus [HIV]: Secondary | ICD-10-CM

## 2020-02-29 DIAGNOSIS — Z90710 Acquired absence of both cervix and uterus: Secondary | ICD-10-CM | POA: Insufficient documentation

## 2020-02-29 DIAGNOSIS — Z803 Family history of malignant neoplasm of breast: Secondary | ICD-10-CM | POA: Diagnosis not present

## 2020-02-29 DIAGNOSIS — K2100 Gastro-esophageal reflux disease with esophagitis, without bleeding: Secondary | ICD-10-CM | POA: Diagnosis not present

## 2020-02-29 DIAGNOSIS — D72819 Decreased white blood cell count, unspecified: Secondary | ICD-10-CM | POA: Insufficient documentation

## 2020-02-29 DIAGNOSIS — Z800 Family history of malignant neoplasm of digestive organs: Secondary | ICD-10-CM | POA: Insufficient documentation

## 2020-02-29 DIAGNOSIS — Z9884 Bariatric surgery status: Secondary | ICD-10-CM | POA: Diagnosis not present

## 2020-02-29 DIAGNOSIS — Z7982 Long term (current) use of aspirin: Secondary | ICD-10-CM | POA: Insufficient documentation

## 2020-02-29 DIAGNOSIS — Z1159 Encounter for screening for other viral diseases: Secondary | ICD-10-CM

## 2020-02-29 LAB — CMP (CANCER CENTER ONLY)
ALT: 37 U/L (ref 0–44)
AST: 25 U/L (ref 15–41)
Albumin: 3.3 g/dL — ABNORMAL LOW (ref 3.5–5.0)
Alkaline Phosphatase: 125 U/L (ref 38–126)
Anion gap: 7 (ref 5–15)
BUN: 12 mg/dL (ref 8–23)
CO2: 21 mmol/L — ABNORMAL LOW (ref 22–32)
Calcium: 8.3 mg/dL — ABNORMAL LOW (ref 8.9–10.3)
Chloride: 111 mmol/L (ref 98–111)
Creatinine: 1.06 mg/dL — ABNORMAL HIGH (ref 0.44–1.00)
GFR, Est AFR Am: >60 mL/min (ref >60)
GFR, Estimated: 55 mL/min — ABNORMAL LOW (ref >60)
Glucose, Bld: 300 mg/dL — ABNORMAL HIGH (ref 70–99)
Potassium: 4.1 mmol/L (ref 3.5–5.1)
Sodium: 139 mmol/L (ref 135–145)
Total Bilirubin: 0.4 mg/dL (ref 0.3–1.2)
Total Protein: 5.9 g/dL — ABNORMAL LOW (ref 6.5–8.1)

## 2020-02-29 LAB — CBC WITH DIFFERENTIAL (CANCER CENTER ONLY)
Abs Immature Granulocytes: 0 10*3/uL (ref 0.00–0.07)
Basophils Absolute: 0 10*3/uL (ref 0.0–0.1)
Basophils Relative: 1 %
Eosinophils Absolute: 0.2 10*3/uL (ref 0.0–0.5)
Eosinophils Relative: 6 %
HCT: 37.5 % (ref 36.0–46.0)
Hemoglobin: 11.5 g/dL — ABNORMAL LOW (ref 12.0–15.0)
Immature Granulocytes: 0 %
Lymphocytes Relative: 25 %
Lymphs Abs: 0.7 10*3/uL (ref 0.7–4.0)
MCH: 30.7 pg (ref 26.0–34.0)
MCHC: 30.7 g/dL (ref 30.0–36.0)
MCV: 100.3 fL — ABNORMAL HIGH (ref 80.0–100.0)
Monocytes Absolute: 0.2 10*3/uL (ref 0.1–1.0)
Monocytes Relative: 8 %
Neutro Abs: 1.7 10*3/uL (ref 1.7–7.7)
Neutrophils Relative %: 60 %
Platelet Count: 182 10*3/uL (ref 150–400)
RBC: 3.74 MIL/uL — ABNORMAL LOW (ref 3.87–5.11)
RDW: 15.4 % (ref 11.5–15.5)
WBC Count: 2.8 10*3/uL — ABNORMAL LOW (ref 4.0–10.5)
nRBC: 0 % (ref 0.0–0.2)

## 2020-02-29 LAB — HEPATITIS PANEL, ACUTE
HCV Ab: NONREACTIVE
Hep A IgM: NONREACTIVE
Hep B C IgM: NONREACTIVE
Hepatitis B Surface Ag: NONREACTIVE

## 2020-02-29 LAB — LACTATE DEHYDROGENASE: LDH: 175 U/L (ref 98–192)

## 2020-02-29 LAB — HIV ANTIBODY (ROUTINE TESTING W REFLEX): HIV Screen 4th Generation wRfx: NONREACTIVE

## 2020-02-29 LAB — FOLATE: Folate: 29.7 ng/mL (ref >5.9)

## 2020-02-29 LAB — VITAMIN B12: Vitamin B-12: 3179 pg/mL — ABNORMAL HIGH (ref 180–914)

## 2020-02-29 NOTE — Progress Notes (Signed)
[place] [phone]   [phone]  CONSULT NOTE  REFERRING PHYSICIAN: Dr. Estanislado Pandy   REASON FOR CONSULTATION:  Leukocytopenia  HPI Savannah [name] is a 64 y.o. female with a past medical history significant for gastric bypass surgery, hysterectomy, pseudogout, lichen sclerosis, TMJ, uterine cancer, GERD with esophagitis, diabetes mellitus, arthritis, and thyroidectomy is referred to clinic for evaluation of Leukocytopenia/neutropenia.  The patient states that she has had leukocytopenia for her entire life.  She states that she cannot recall an instance in which her total WBCs were greater than 3.5.  Per chart review, it appears that the patient's WBCs have been low for at least 10 years with occasional periods of mild thrombocytopenia and anemia.  She states she had a history of anemia prior to a hysterectomy for uterine cancer.  The patient was referred to the clinic after she saw her rheumatologist on February 10, 2020 who she follows for inflammatory arthritis, pseudogout, and osteoarthritis.  She had a routine CBC performed that day which showed a low total white blood cell count of 2.3, a low ANC at 955, mild anemia with a hemoglobin of 11.6.  She was subsequently referred to our clinic today for further evaluation and recommendations regarding her current condition and treatment options.  Overall, the patient is feeling fairly well today except for some easy bruising on her upper extremities and occasionally on her upper legs.  She notices since her late 69s she has bruises easier.  She denies any epistaxis, hemoptysis, hematemesis, melena, or hematochezia.  She notes some gingival bleeding occasionally when she brushes her teeth.  She also states that her urinalysis usually notes the presence of blood.  She denies any recent infections or history of viral infections such as hepatitis or HIV.  She denies any immunosuppressive drug use.  She denies any recent  fever or chills.  Her last episode of chills occurred approximately 1 year ago.  She denies any unexpected weight loss or lymphadenopathy.  She takes p.o. vitamin B12 supplements.  She denies any new medications.  She denies any herbal supplements.  She is up-to-date on her colonoscopy and endoscopy which was performed in 2018.  Her family history significant for a father who had myelodysplastic syndrome.  She states that her father had this for approximately 10 years prior to his passing.  Her father died at the age of 75.  Her father also had hypertension, questionable CHF, and diabetes.  The patient's paternal aunt had leukemia.  The patient's mother had diabetes.  Patient has 3 sisters and a brother without any known hematologic abnormalities.  The patient is a Web designer by training.  She is single and does not have any children.  She denies any history of drug use.  She denies any significant alcohol use.  She smoked for approximately 17 years 1.5 packs/day.  HPI  Past Medical History:  Diagnosis Date  . ABDOMINAL PAIN, CHRONIC 10/20/2009  . Acute cystitis 08/17/2009  . ALLERGIC RHINITIS 11/21/2007  . Allergy   . ASYMPTOMATIC POSTMENOPAUSAL STATUS 09/29/2008  . Bariatric surgery status 07/07/2010  . Bariatric surgery status 07/07/2010   Qualifier: Diagnosis of  By: Loanne Drilling MD, Jacelyn Pi   . Cancer ([place])    uterine  . Cataract   . Coronary artery disease   . DEPRESSION 11/21/2007  . DIABETES MELLITUS, TYPE II 07/08/2007  . DYSPNEA 05/20/2009  . Edema 05/20/2009  . Eosinophilic esophagitis 12/16/5359  . FEVER UNSPECIFIED 10/20/2009  .  FEVER, HX OF 03/09/2010  . GERD (gastroesophageal reflux disease)   . Headache(784.0) 02/06/2010  . Hepatomegaly 01/06/2008  . HYPERLIPIDEMIA 07/08/2007  . HYPERTENSION 07/08/2007  . LEUKOPENIA, MILD 09/29/2008  . OBESITY 01/06/2008  . OSTEOARTHRITIS 01/06/2008  . Other chronic nonalcoholic liver disease 0/35/5974  . PERIPHERAL NEUROPATHY 11/21/2007  . Peripheral  neuropathy   . Postsurgical hypothyroidism 09/19/2010  . SINUSITIS- ACUTE-NOS 11/21/2007  . TB SKIN TEST, POSITIVE 02/06/2010  . THYROID NODULE 03/09/2010    Past Surgical History:  Procedure Laterality Date  . ABDOMINAL HYSTERECTOMY    . BASAL CELL CARCINOMA EXCISION    . CARDIAC CATHETERIZATION    . COLONOSCOPY    . EYE SURGERY Bilateral 08/2018, 09/2018  . FOOT SURGERY Left    10/2019  . KNEE ARTHROSCOPY Left   . LEFT HEART CATH AND CORONARY ANGIOGRAPHY N/A 01/20/2019   Procedure: LEFT HEART CATH AND CORONARY ANGIOGRAPHY;  Surgeon: Belva Crome, MD;  Location: [facility];  Service: Cardiovascular;  Laterality: N/A;  . LYMPH NODE DISSECTION    . OOPHORECTOMY    . Right total knee replacement    . ROUX-EN-Y GASTRIC BYPASS  2011  . SKIN TAG REMOVAL    . THYROIDECTOMY    . TONSILLECTOMY AND ADENOIDECTOMY    . UPPER GASTROINTESTINAL ENDOSCOPY      Family History  Problem Relation Age of Onset  . Multiple sclerosis Sister   . Breast cancer Sister        breast onset age 4  . Osteoporosis Sister   . Diabetes Mother   . Diabetes Father   . Congestive Heart Failure Father   . Hypertension Father   . Myelodysplastic syndrome Father   . Liver cancer Maternal Aunt   . Breast cancer Maternal Aunt   . Diabetes Maternal Aunt   . Colon cancer Maternal Uncle   . Diabetes Maternal Uncle   . Diabetes Maternal Grandmother   . Diabetes Paternal Grandmother     Social History Social History   Tobacco Use  . Smoking status: Former Smoker    Packs/day: 1.50    Years: 15.00    Pack years: 22.50    Types: Cigarettes  . Smokeless tobacco: Never Used  Substance Use Topics  . Alcohol use: Yes    Alcohol/week: 0.0 standard drinks    Comment: rarely  . Drug use: Never    Allergies  Allergen Reactions  . Ace Inhibitors Cough  . Caffeine     PVCs  . Ciprofloxacin Itching  . Morphine And Related Itching    Pt tolerates medication if given with diphenhydramine  .  Mucinex [Guaifenesin Er]     PVCs  . Nsaids     Gastric Bypass Surgery - unable to take, tolerates ec 81 aspirin   . Other     Antihistamine-alkylamine - PVCs tolerates benadryl     Current Outpatient Medications  Medication Sig Dispense Refill  . aspirin EC 81 MG tablet Take 1 tablet (81 mg total) by mouth daily. Starting 01/18/19 prior to cardiac cath    . atorvastatin (LIPITOR) 40 MG tablet Take 1 tablet (40 mg total) by mouth daily. 90 tablet 1  . Biotin-D POWD Take 1 tablet by mouth daily.    . canagliflozin (INVOKANA) 300 MG TABS tablet Take 1 tablet (300 mg total) by mouth daily before breakfast. 90 tablet 3  . Cholecalciferol (VITAMIN D3) 125 MCG (5000 UT) CAPS Take 5,000 Units by mouth daily.    [name]  desloratadine (CLARINEX) 5 MG tablet Take 5 mg by mouth daily.    . diclofenac sodium (VOLTAREN) 1 % GEL Apply 4 g topically 4 (four) times daily. (Patient taking differently: Apply 4 g topically as needed (pain). ) 100 g 11  . docusate sodium (COLACE) 100 MG capsule Take 200 mg by mouth 2 (two) times daily.    . Dulaglutide (TRULICITY) 1.5 TD/3.2KG SOPN Inject 1.5 mg into the skin once a week. 12 pen 3  . glucose blood test strip 1 each by Other route 3 (three) times daily. Use to monitor glucose levels TID; E11.40 100 each 2  . halobetasol (ULTRAVATE) 0.05 % cream Apply 1 application topically 2 (two) times daily as needed (lichen sclerosus).   2  . HYDROcodone-acetaminophen (NORCO/VICODIN) 5-325 MG tablet Take 1 tablet by mouth daily as needed for moderate pain or severe pain.    [name] levothyroxine (SYNTHROID) 112 MCG tablet TAKE 1 TABLET BY MOUTH EVERY DAY (Patient taking differently: Take 112 mcg by mouth daily before breakfast. ) 90 tablet 3  . MAGNESIUM PO Take 1 tablet by mouth daily as needed (cramps).     . metFORMIN (GLUCOPHAGE-XR) 500 MG 24 hr tablet Take 1 tablet (500 mg total) by mouth 2 (two) times daily. (Patient taking differently: Take 1,000 mg by mouth 2 (two) times daily. )  180 tablet 3  . MITIGARE 0.6 MG CAPS Take 0.6 mg by mouth daily. 30 capsule 2  . Multiple Vitamin (MULTIVITAMIN) tablet Take 1 tablet by mouth daily.     . nitroGLYCERIN (NITROSTAT) 0.4 MG SL tablet Place 1 tablet (0.4 mg total) under the tongue every 5 (five) minutes as needed. 25 tablet 3  . oxyCODONE-acetaminophen (PERCOCET) 5-325 MG tablet Take 1 tablet by mouth every 6 (six) hours as needed for severe pain. 30 tablet 0  . pantoprazole (PROTONIX) 40 MG tablet TAKE 1 TABLET BY MOUTH TWICE A DAY (Patient taking differently: Take 40 mg by mouth 2 (two) times daily. ) 180 tablet 1  . phenazopyridine (PYRIDIUM) 100 MG tablet Take 100 mg by mouth 3 (three) times daily as needed for pain.    . Probiotic CAPS Take 1 capsule by mouth daily.    . prochlorperazine (COMPAZINE) 10 MG tablet Take 1 tablet (10 mg total) by mouth every 6 (six) hours as needed (migraine). Take with imitrex and benadryl 30 tablet 0  . repaglinide (PRANDIN) 2 MG tablet Take 1 tablet (2 mg total) by mouth 2 (two) times daily before a meal. 180 tablet 3  . SUMAtriptan (IMITREX) 25 MG tablet Take 1 tablet (25 mg total) by mouth every 2 (two) hours as needed for migraine. Take with benadryl and compazine 10 tablet 0  . traMADol (ULTRAM) 50 MG tablet Take 50 mg by mouth 2 (two) times daily as needed for moderate pain.    . TURMERIC CURCUMIN PO Take 1 capsule by mouth daily.    . vitamin B-12 (CYANOCOBALAMIN) 1000 MCG tablet Take 3,000-4,000 mcg by mouth daily.    . furosemide (LASIX) 20 MG tablet Take 1 tablet (20 mg total) by mouth daily. (Patient taking differently: Take 20 mg by mouth daily as needed for fluid. ) 90 tablet 3   No current facility-administered medications for this visit.    Review of Systems   REVIEW OF SYSTEMS:   Review of Systems  Constitutional: Negative for appetite change, chills, fatigue, fever and unexpected weight change.  HENT: Negative for mouth sores, nosebleeds, sore throat and trouble swallowing.  Eyes: Negative for eye problems and icterus.  Respiratory: Negative for cough, hemoptysis, shortness of breath and wheezing.  Cardiovascular: Negative for chest pain and leg swelling.  Gastrointestinal: Negative for abdominal pain, constipation, diarrhea, nausea and vomiting.  Genitourinary: Negative for bladder incontinence, difficulty urinating, dysuria, frequency and hematuria.   Musculoskeletal: Negative for back pain, gait problem, neck pain and neck stiffness.  Skin: Negative for itching and rash.  Neurological: Negative for dizziness, extremity weakness, gait problem, headaches, light-headedness and seizures.  Hematological: Positive for bruising on upper arms bilaterally. Negative for adenopathy.  Psychiatric/Behavioral: Negative for confusion, depression and sleep disturbance. The patient is not nervous/anxious.    PHYSICAL EXAMINATION:  Blood pressure 126/61, pulse 90, temperature 98.2 F (36.8 C), temperature source Temporal, resp. rate 20, height 5' 11"  (1.803 m), weight 170 lb 12.8 oz (77.5 kg), SpO2 100 %.  ECOG PERFORMANCE STATUS: 1  Physical Exam  Constitutional: Oriented to person, place, and time and well-developed, well-nourished, and in no distress.  HENT:  Head: Normocephalic and atraumatic.  Mouth/Throat: Oropharynx is clear and moist. No oropharyngeal exudate.  Eyes: Conjunctivae are normal. Right eye exhibits no discharge. Left eye exhibits no discharge. No scleral icterus.  Neck: Normal range of motion. Neck supple.  Cardiovascular: Normal rate, regular rhythm, normal heart sounds and intact distal pulses.   Pulmonary/Chest: Effort normal and breath sounds normal. No respiratory distress. No wheezes. No rales.  Abdominal: Soft. Bowel sounds are normal. Exhibits no distension and no mass. There is no tenderness.  Musculoskeletal: Positive for bilaterally lower extremity swelling L>R (stable per patient report). Normal range of motion.  Lymphadenopathy:    No  cervical adenopathy.  Neurological: Alert and oriented to person, place, and time. Exhibits normal muscle tone. Gait normal. Coordination normal.  Skin: Positive for bruising on upper extremities bilaterally. Skin is warm and dry. No rash noted. Not diaphoretic. No erythema. No pallor.  Psychiatric: Mood, memory and judgment normal.  Vitals reviewed.    LABORATORY DATA: Lab Results  Component Value Date   WBC 2.8 (L) 02/29/2020   HGB 11.5 (L) 02/29/2020   HCT 37.5 02/29/2020   MCV 100.3 (H) 02/29/2020   PLT 182 02/29/2020      Chemistry      Component Value Date/Time   NA 139 02/29/2020 1303   NA 144 07/06/2019 1524   K 4.1 02/29/2020 1303   CL 111 02/29/2020 1303   CO2 21 (L) 02/29/2020 1303   BUN 12 02/29/2020 1303   BUN 22 07/06/2019 1524   CREATININE 1.06 (H) 02/29/2020 1303   CREATININE 1.06 (H) 02/10/2020 1157      Component Value Date/Time   CALCIUM 8.3 (L) 02/29/2020 1303   CALCIUM 8.9 01/30/2013 1017   ALKPHOS 125 02/29/2020 1303   AST 25 02/29/2020 1303   ALT 37 02/29/2020 1303   BILITOT 0.4 02/29/2020 1303       RADIOGRAPHIC STUDIES: DG Chest 2 View  Result Date: 02/26/2020 CLINICAL DATA:  Shortness of breath. EXAM: CHEST - 2 VIEW COMPARISON:  Radiograph 618 FINDINGS: The cardiomediastinal contours are normal. The lungs are clear. Pulmonary vasculature is normal. No consolidation, pleural effusion, or pneumothorax. No acute osseous abnormalities are seen. Surgical clips at the thoracic inlet. IMPRESSION: No acute chest findings. Electronically Signed   By: Keith Rake M.D.   On: 02/26/2020 21:34   DG Foot Complete Left  Result Date: 02/01/2020 Please see detailed radiograph report in office note.  VAS [place] LE ART SEG MULTI (Segm&LE Reynauds)  Result Date: 02/11/2020 LOWER EXTREMITY DOPPLER STUDY Indications: Leg pain at night in lower legs and feet, she believes it is from              her neuropathy. She denies any hip, buttock and leg pain while               walking; denies any claudication symptoms. High Risk Factors: Hyperlipidemia, Diabetes, past history of smoking, coronary                    artery disease. Other Factors: Patient states she has neuropathy and left leg swelling.  Performing Technologist: Wilkie Aye RVT  Examination Guidelines: A complete evaluation includes at minimum, Doppler waveform signals and systolic blood pressure reading at the level of bilateral brachial, anterior tibial, and posterior tibial arteries, when vessel segments are accessible. Bilateral testing is considered an integral part of a complete examination. Photoelectric Plethysmograph (PPG) waveforms and toe systolic pressure readings are included as required and additional duplex testing as needed. Limited examinations for reoccurring indications may be performed as noted.  ABI Findings: +---------+------------------+-----+---------+--------+ Right    Rt Pressure (mmHg)IndexWaveform Comment  +---------+------------------+-----+---------+--------+ Brachial 115                                      +---------+------------------+-----+---------+--------+ CFA                             triphasic         +---------+------------------+-----+---------+--------+ Popliteal                       triphasic         +---------+------------------+-----+---------+--------+ ATA      144               1.23 triphasic         +---------+------------------+-----+---------+--------+ PTA      131               1.12 triphasic         +---------+------------------+-----+---------+--------+ PERO     148               1.26 triphasic         +---------+------------------+-----+---------+--------+ Kizzie Ide               1.32 Normal            +---------+------------------+-----+---------+--------+ +---------+------------------+-----+---------+-------+ Left     Lt Pressure (mmHg)IndexWaveform Comment  +---------+------------------+-----+---------+-------+ Brachial 117                                     +---------+------------------+-----+---------+-------+ CFA                             triphasic        +---------+------------------+-----+---------+-------+ Popliteal                       triphasic        +---------+------------------+-----+---------+-------+ ATA      151               1.29 triphasic        +---------+------------------+-----+---------+-------+ PTA      144  1.23 triphasic        +---------+------------------+-----+---------+-------+ PERO     150               1.28 triphasic        +---------+------------------+-----+---------+-------+ Great Toe91                0.78 Normal           +---------+------------------+-----+---------+-------+  Summary: Right: Resting right ankle-brachial index is within normal range. No evidence of significant right lower extremity arterial disease. The right toe-brachial index is normal. Left: Resting left ankle-brachial index is within normal range. No evidence of significant left lower extremity arterial disease. The left toe-brachial index is normal.  *See table(s) above for measurements and observations.  Electronically signed by Jenkins Rouge MD on 02/11/2020 at 12:37:01 PM.    Final    LE VENOUS  Result Date: 02/27/2020  Lower Venous DVTStudy Indications: Pain, and bruise mid calf. Patient felt a burning sensation in calf and bruise appeared just after.  Comparison Study: No prior study on file Performing Technologist: Sharion Dove RVS  Examination Guidelines: A complete evaluation includes B-mode imaging, spectral Doppler, color Doppler, and power Doppler as needed of all accessible portions of each vessel. Bilateral testing is considered an integral part of a complete examination. Limited examinations for reoccurring indications may be performed as noted. The reflux portion of the exam is performed  with the patient in reverse Trendelenburg.  +-----+---------------+---------+-----------+----------+--------------+ RIGHTCompressibilityPhasicitySpontaneityPropertiesThrombus Aging +-----+---------------+---------+-----------+----------+--------------+ CFV  Full           Yes      Yes                                 +-----+---------------+---------+-----------+----------+--------------+   +---------+---------------+---------+-----------+----------+--------------+ LEFT     CompressibilityPhasicitySpontaneityPropertiesThrombus Aging +---------+---------------+---------+-----------+----------+--------------+ CFV      Full           Yes      Yes                                 +---------+---------------+---------+-----------+----------+--------------+ SFJ      Full                                                        +---------+---------------+---------+-----------+----------+--------------+ FV Prox  Full                                                        +---------+---------------+---------+-----------+----------+--------------+ FV Mid   Full                                                        +---------+---------------+---------+-----------+----------+--------------+ FV DistalFull                                                        +---------+---------------+---------+-----------+----------+--------------+  PFV      Full                                                        +---------+---------------+---------+-----------+----------+--------------+ POP      Full           Yes      Yes                                 +---------+---------------+---------+-----------+----------+--------------+ PTV      Full                                                        +---------+---------------+---------+-----------+----------+--------------+ PERO     Full                                                         +---------+---------------+---------+-----------+----------+--------------+     Summary: RIGHT: - No evidence of common femoral vein obstruction.  LEFT: - anechoic area noted at site of bruise, possibly consistent with muscle tear.  *See table(s) above for measurements and observations. Electronically signed by Monica Martinez MD on 02/27/2020 at 3:13:45 PM.    Final     ASSESSMENT: This is a very pleasant 64 year old Caucasian female referred to the clinic for neutropenia and anemia.    PLAN: The patient was seen with Dr. Julien Nordmann today. She had several lab studies performed to further evaluate her condition including CBC, CMP, LDH, vitamin B12, folate, hepatitis panel, HIV, ANA with reflex, and RF.   Her CBC from today showed neutropenia with WBC 2.8. and anemia with 11.5. Normal platelet count. Other labs pending.   Dr. Julien Nordmann recommends that the patient undergo a bone marrow biopsy and aspirate to rule out a underlying bone marrow pathology contributing to her condition.   We will see her back for a follow up visit after her bone marrow biopsy to review the results.  The patient voices understanding of current disease status and treatment options and is in agreement with the current care plan.  All questions were answered. The patient knows to call the clinic with any problems, questions or concerns. We can certainly see the patient much sooner if necessary.  Thank you so much for allowing me to participate in the care of Savannah [name]. I will continue to follow up the patient with you and assist in her care.  The total time spent in the appointment was 60 minutes.  Disclaimer: This note was dictated with voice recognition software. Similar sounding words can inadvertently be transcribed and may not be corrected upon review.   Rush Salce L Tedford Berg February 29, 2020, 2:23 PM  ADDENDUM: Hematology/Oncology Attending: I had a face-to-face encounter with the patient today.  I  recommended her care plan.  This is a very pleasant 64 years old white female presented for evaluation of persistent leukocytopenia and anemia.  The patient mentions that she had  leukocytopenia for several years but has been getting worse recently.  She had a lot of extensive work in the past by her rheumatologist and primary care physicians that were not revealing. She has a family history of MDS in her father and paternal aunt had leukemia. I had a lengthy discussion with the patient today about her condition and further investigation to confirm her diagnosis.  We order several studies today including repeat CBC, comprehensive metabolic panel, LDH, HIV, hepatitis panel, ANA, rheumatoid factor, serum folate and vitamin B12 level. I also discussed with the patient proceeding with a bone marrow biopsy and aspirate to rule area with any bone marrow abnormality leading to her bicytopenia. We will arrange for the patient to come back for follow-up visit 1 week after her bone marrow biopsy and aspirate for discussion of the results and recommendation regarding her condition. The patient was advised to call immediately if she has any other concerning symptoms in the interval.  Disclaimer: This note was dictated with voice recognition software. Similar sounding words can inadvertently be transcribed and may be missed upon review. Eilleen Kempf, MD 02/29/20

## 2020-03-01 LAB — RHEUMATOID FACTOR: Rheumatoid fact SerPl-aCnc: <10 IU/mL (ref 0.0–13.9)

## 2020-03-01 LAB — ANTINUCLEAR ANTIBODIES, IFA: ANA Ab, IFA: NEGATIVE

## 2020-03-02 ENCOUNTER — Telehealth: Payer: Self-pay | Admitting: Physician Assistant

## 2020-03-02 ENCOUNTER — Telehealth: Payer: Self-pay

## 2020-03-02 NOTE — Telephone Encounter (Signed)
Called the patient to let her know that her other blood work was unremarkable, except her B12 was a little bit high. The plan is to continue with the bone marrow biopsy next week as scheduled and we will see her back for a follow up visit a few days later. She expressed understanding.

## 2020-03-02 NOTE — Telephone Encounter (Signed)
Called to verify that she is aware of bone marrow appt on 4/29 at 0730, arrive at 0720 that morning. She is aware of appt.  Called flow cytometry they are aware of bone marrow appt.

## 2020-03-03 ENCOUNTER — Encounter: Payer: Self-pay | Admitting: Internal Medicine

## 2020-03-03 ENCOUNTER — Other Ambulatory Visit: Payer: Self-pay

## 2020-03-03 ENCOUNTER — Ambulatory Visit (INDEPENDENT_AMBULATORY_CARE_PROVIDER_SITE_OTHER): Payer: Medicare Other | Admitting: Internal Medicine

## 2020-03-03 VITALS — BP 110/76 | HR 76 | Ht 71.0 in | Wt 173.0 lb

## 2020-03-03 DIAGNOSIS — I2510 Atherosclerotic heart disease of native coronary artery without angina pectoris: Secondary | ICD-10-CM | POA: Diagnosis not present

## 2020-03-03 DIAGNOSIS — R079 Chest pain, unspecified: Secondary | ICD-10-CM

## 2020-03-03 DIAGNOSIS — M545 Low back pain: Secondary | ICD-10-CM | POA: Diagnosis not present

## 2020-03-03 DIAGNOSIS — E1140 Type 2 diabetes mellitus with diabetic neuropathy, unspecified: Secondary | ICD-10-CM

## 2020-03-03 DIAGNOSIS — M542 Cervicalgia: Secondary | ICD-10-CM | POA: Diagnosis not present

## 2020-03-03 DIAGNOSIS — Z79899 Other long term (current) drug therapy: Secondary | ICD-10-CM | POA: Diagnosis not present

## 2020-03-03 DIAGNOSIS — M79671 Pain in right foot: Secondary | ICD-10-CM | POA: Diagnosis not present

## 2020-03-03 DIAGNOSIS — E785 Hyperlipidemia, unspecified: Secondary | ICD-10-CM | POA: Diagnosis not present

## 2020-03-03 DIAGNOSIS — I5032 Chronic diastolic (congestive) heart failure: Secondary | ICD-10-CM

## 2020-03-03 DIAGNOSIS — Z9884 Bariatric surgery status: Secondary | ICD-10-CM | POA: Diagnosis not present

## 2020-03-03 DIAGNOSIS — R519 Headache, unspecified: Secondary | ICD-10-CM | POA: Diagnosis not present

## 2020-03-03 DIAGNOSIS — E1169 Type 2 diabetes mellitus with other specified complication: Secondary | ICD-10-CM

## 2020-03-03 DIAGNOSIS — M79672 Pain in left foot: Secondary | ICD-10-CM | POA: Diagnosis not present

## 2020-03-03 NOTE — Patient Instructions (Signed)
Medication Instructions:  The current medical regimen is effective;  continue present plan and medications as directed. Please refer to the Current Medication list given to you today.  *If you need a refill on your cardiac medications before your next appointment, please call your pharmacy*  Follow-Up: Your next appointment:  6 month(s) Please call our office 2 months in advance to schedule this appointment In Person with Cherlynn Kaiser, MD  At [facility], you and your health needs are our priority.  As part of our continuing mission to provide you with exceptional heart care, we have created designated Provider Care Teams.  These Care Teams include your primary Cardiologist (physician) and Advanced Practice Providers (APPs -  Physician Assistants and Nurse Practitioners) who all work together to provide you with the care you need, when you need it.

## 2020-03-03 NOTE — Progress Notes (Signed)
Cardiology Office Note:    Date:  03/03/2020   ID:  Savannah [name], DOB 1956/11/10, MRN [id number]  PCP:  Savannah Barrack, MD  Cardiologist:  Elouise Munroe, MD  Electrophysiologist:  None   Referring MD: Savannah Barrack, MD   Chief Complaint: cardiovascular follow up  History of Present Illness:    Savannah [name] is a 64 y.o. female with a hx of chest painon exertion as well as atypical chest pain.Her past medical history is significant for PVCs, esophageal rings with multiple previous dilations, bariatric surgery in 2011 with appropriate postoperative weight loss.   She is currently undergoing an evaluation with Dr. Earlie [name] in the cancer center for an abnormal CBC with leukopenia. She has a family history of myelodysplastic syndrome with her father having that condition. She is very concerned and is visibly worried on exam today. She plans to have a bone marrow biopsy next Thursday. I have wished her well and we hope for normal results.  She is stable from a cardiovascular standpoint and is not experiencing significant chest pain, shortness of breath. She has leg swelling but recently had a lower extremity DVT ultrasound when presenting to the ER for leg discomfort that was unremarkable. She denies recent syncope, denies significant lightheadedness or dizziness. She suggest she has gained some weight recently and is hopeful to resume her best diet practices including reducing her carbohydrate intake. She follows with Dr. Loanne [name] for her diabetes. She has recently restarted Invokana.  Past Medical History:  Diagnosis Date  . ABDOMINAL PAIN, CHRONIC 10/20/2009  . Acute cystitis 08/17/2009  . ALLERGIC RHINITIS 11/21/2007  . Allergy   . ASYMPTOMATIC POSTMENOPAUSAL STATUS 09/29/2008  . Bariatric surgery status 07/07/2010  . Bariatric surgery status 07/07/2010   Qualifier: Diagnosis of  By: Savannah Drilling MD, Savannah [name]   . Cancer ([place])    uterine  . Cataract   . Coronary artery disease   .  DEPRESSION 11/21/2007  . DIABETES MELLITUS, TYPE II 07/08/2007  . DYSPNEA 05/20/2009  . Edema 05/20/2009  . Eosinophilic esophagitis 05/20/2955  . FEVER UNSPECIFIED 10/20/2009  . FEVER, HX OF 03/09/2010  . GERD (gastroesophageal reflux disease)   . Headache(784.0) 02/06/2010  . Hepatomegaly 01/06/2008  . HYPERLIPIDEMIA 07/08/2007  . HYPERTENSION 07/08/2007  . LEUKOPENIA, MILD 09/29/2008  . OBESITY 01/06/2008  . OSTEOARTHRITIS 01/06/2008  . Other chronic nonalcoholic liver disease 12/26/863  . PERIPHERAL NEUROPATHY 11/21/2007  . Peripheral neuropathy   . Postsurgical hypothyroidism 09/19/2010  . SINUSITIS- ACUTE-NOS 11/21/2007  . TB SKIN TEST, POSITIVE 02/06/2010  . THYROID NODULE 03/09/2010    Past Surgical History:  Procedure Laterality Date  . ABDOMINAL HYSTERECTOMY    . BASAL CELL CARCINOMA EXCISION    . CARDIAC CATHETERIZATION    . COLONOSCOPY    . EYE SURGERY Bilateral 08/2018, 09/2018  . FOOT SURGERY Left    10/2019  . KNEE ARTHROSCOPY Left   . LEFT HEART CATH AND CORONARY ANGIOGRAPHY N/A 01/20/2019   Procedure: LEFT HEART CATH AND CORONARY ANGIOGRAPHY;  Surgeon: Savannah Crome, MD;  Location: [facility];  Service: Cardiovascular;  Laterality: N/A;  . LYMPH NODE DISSECTION    . OOPHORECTOMY    . Right total knee replacement    . ROUX-EN-Y GASTRIC BYPASS  2011  . SKIN TAG REMOVAL    . THYROIDECTOMY    . TONSILLECTOMY AND ADENOIDECTOMY    . UPPER GASTROINTESTINAL ENDOSCOPY      Current Medications: Current Meds  Medication Sig  .  aspirin EC 81 MG tablet Take 1 tablet (81 mg total) by mouth daily. Starting 01/18/19 prior to cardiac cath  . atorvastatin (LIPITOR) 40 MG tablet Take 1 tablet (40 mg total) by mouth daily.  Marnette Burgess POWD Take 1 tablet by mouth daily.  . canagliflozin (INVOKANA) 300 MG TABS tablet Take 1 tablet (300 mg total) by mouth daily before breakfast.  . Cholecalciferol (VITAMIN D3) 125 MCG (5000 UT) CAPS Take 5,000 Units by mouth daily.  Savannah [name] desloratadine  (CLARINEX) 5 MG tablet Take 5 mg by mouth daily.  . diclofenac sodium (VOLTAREN) 1 % GEL Apply 4 g topically 4 (four) times daily. (Patient taking differently: Apply 4 g topically as needed (pain). )  . docusate sodium (COLACE) 100 MG capsule Take 200 mg by mouth 2 (two) times daily.  . Dulaglutide (TRULICITY) 1.5 LY/6.5KP SOPN Inject 1.5 mg into the skin once a week.  . furosemide (LASIX) 20 MG tablet Take 1 tablet (20 mg total) by mouth daily. (Patient taking differently: Take 20 mg by mouth daily as needed for fluid. )  . glucose blood test strip 1 each by Other route 3 (three) times daily. Use to monitor glucose levels TID; E11.40  . halobetasol (ULTRAVATE) 0.05 % cream Apply 1 application topically 2 (two) times daily as needed (lichen sclerosus).   Savannah [name] HYDROcodone-acetaminophen (NORCO/VICODIN) 5-325 MG tablet Take 1 tablet by mouth daily as needed for moderate pain or severe pain.  Savannah [name] levothyroxine (SYNTHROID) 112 MCG tablet TAKE 1 TABLET BY MOUTH EVERY DAY (Patient taking differently: Take 112 mcg by mouth daily before breakfast. )  . MAGNESIUM PO Take 1 tablet by mouth daily as needed (cramps).   . metFORMIN (GLUCOPHAGE-XR) 500 MG 24 hr tablet Take 1 tablet (500 mg total) by mouth 2 (two) times daily. (Patient taking differently: Take 1,000 mg by mouth 2 (two) times daily. )  . MITIGARE 0.6 MG CAPS Take 0.6 mg by mouth daily.  . Multiple Vitamin (MULTIVITAMIN) tablet Take 1 tablet by mouth daily.   . nitroGLYCERIN (NITROSTAT) 0.4 MG SL tablet Place 1 tablet (0.4 mg total) under the tongue every 5 (five) minutes as needed.  Savannah [name] oxyCODONE-acetaminophen (PERCOCET) 5-325 MG tablet Take 1 tablet by mouth every 6 (six) hours as needed for severe pain.  . pantoprazole (PROTONIX) 40 MG tablet TAKE 1 TABLET BY MOUTH TWICE A DAY (Patient taking differently: Take 40 mg by mouth 2 (two) times daily. )  . phenazopyridine (PYRIDIUM) 100 MG tablet Take 100 mg by mouth 3 (three) times daily as needed for pain.    . Probiotic CAPS Take 1 capsule by mouth daily.  . prochlorperazine (COMPAZINE) 10 MG tablet Take 1 tablet (10 mg total) by mouth every 6 (six) hours as needed (migraine). Take with imitrex and benadryl  . repaglinide (PRANDIN) 2 MG tablet Take 1 tablet (2 mg total) by mouth 2 (two) times daily before a meal.  . SUMAtriptan (IMITREX) 25 MG tablet Take 1 tablet (25 mg total) by mouth every 2 (two) hours as needed for migraine. Take with benadryl and compazine  . traMADol (ULTRAM) 50 MG tablet Take 50 mg by mouth 2 (two) times daily as needed for moderate pain.  . TURMERIC CURCUMIN PO Take 1 capsule by mouth daily.     Allergies:   Ace inhibitors, Caffeine, Ciprofloxacin, Morphine and related, Mucinex [guaifenesin er], Nsaids, and Other   Social History   Socioeconomic History  . Marital status: Single    Spouse name: Not on  file  . Number of children: 0  . Years of education: 16  . Highest education level: Bachelor's degree (e.g., BA, AB, BS)  Occupational History  . Occupation: Optometrist  Tobacco Use  . Smoking status: Former Smoker    Packs/day: 1.50    Years: 15.00    Pack years: 22.50    Types: Cigarettes  . Smokeless tobacco: Never Used  Substance and Sexual Activity  . Alcohol use: Yes    Alcohol/week: 0.0 standard drinks    Comment: rarely  . Drug use: Never  . Sexual activity: Not on file  Other Topics Concern  . Not on file  Social History Narrative  . Not on file   Social Determinants of Health   Financial Resource Strain: Low Risk   . Difficulty of Paying Living Expenses: Not hard at all  Food Insecurity: No Food Insecurity  . Worried About Charity fundraiser in the Last Year: Never true  . Ran Out of Food in the Last Year: Never true  Transportation Needs: No Transportation Needs  . Lack of Transportation (Medical): No  . Lack of Transportation (Non-Medical): No  Physical Activity: Sufficiently Active  . Days of Exercise per Week: 5 days  . Minutes of  Exercise per Session: 30 min  Stress: No Stress Concern Present  . Feeling of Stress : Not at all  Social Connections:   . Frequency of Communication with Friends and Family:   . Frequency of Social Gatherings with Friends and Family:   . Attends Religious Services:   . Active Member of Clubs or Organizations:   . Attends Archivist Meetings:   Savannah [name] Marital Status:      Family History: The patient's family history includes Breast cancer in her maternal aunt and sister; Colon cancer in her maternal uncle; Congestive Heart Failure in her father; Diabetes in her father, maternal aunt, maternal grandmother, maternal uncle, mother, and paternal grandmother; Hypertension in her father; Liver cancer in her maternal aunt; Multiple sclerosis in her sister; Myelodysplastic syndrome in her father; Osteoporosis in her sister.  ROS:   Please see the history of present illness.    All other systems reviewed and are negative.  EKGs/Labs/Other Studies Reviewed:    The following studies were reviewed today:  EKG: Not performed today  Recent Labs: 07/06/2019: BNP 69.0 01/14/2020: TSH 0.92 02/29/2020: ALT 37; BUN 12; Creatinine 1.06; Hemoglobin 11.5; Platelet Count 182; Potassium 4.1; Sodium 139  Recent Lipid Panel    Component Value Date/Time   CHOL 136 04/29/2019 1336   TRIG 102 04/29/2019 1336   HDL 47 04/29/2019 1336   CHOLHDL 2.9 04/29/2019 1336   CHOLHDL 4 07/04/2018 1403   VLDL 24.6 07/04/2018 1403   LDLCALC 69 04/29/2019 1336   LDLDIRECT 99.0 10/12/2011 1219    Physical Exam:    VS:  BP 110/76   Pulse 76   Ht 5' 11"  (1.803 m)   Wt 173 lb (78.5 kg)   SpO2 99%   BMI 24.13 kg/m     Wt Readings from Last 5 Encounters:  03/03/20 173 lb (78.5 kg)  02/29/20 170 lb 12.8 oz (77.5 kg)  02/26/20 160 lb (72.6 kg)  02/10/20 163 lb (73.9 kg)  01/14/20 166 lb (75.3 kg)     Constitutional: No acute distress Eyes: sclera non-icteric, normal conjunctiva and lids ENMT: normal  dentition, moist mucous membranes Cardiovascular: regular rhythm, normal rate, no murmurs. S1 and S2 normal. Radial pulses normal bilaterally. No jugular venous  distention.  Respiratory: clear to auscultation bilaterally GI : normal bowel sounds, soft and nontender. No distention.   MSK: extremities warm, well perfused. Bilateral lower extremity swelling, trivial in the right lower extremity, mild on the left lower extremity. Edema.  NEURO: grossly nonfocal exam, moves all extremities. PSYCH: alert and oriented x 3, normal mood and affect.   ASSESSMENT:    1. Coronary artery disease involving native coronary artery of native heart without angina pectoris   2. Dyslipidemia associated with type 2 diabetes mellitus ([place])   3. Type 2 diabetes mellitus with diabetic neuropathy, unspecified whether long term insulin use ([place])   4. Chronic diastolic CHF (congestive heart failure) (HCC)   5. Chest pain, unspecified type   6. H/O gastric bypass    PLAN:    Coronary artery disease involving native coronary artery of native heart without angina pectoris -currently stable without concerning chest pain or shortness of breath. Continue to observe. Continue atorvastatin.  Dyslipidemia associated with type 2 diabetes mellitus (HCC)-continue atorvastatin, lipids optimized on last check 1 year ago. Can follow labs with endocrinology or primary care.  Type 2 diabetes mellitus with diabetic neuropathy-currently on Invokana, tolerating well. Per PCP/endocrine.  Chronic diastolic CHF (congestive heart failure) (HCC)-stable, mild lower extremity edema without heart failure decompensation, likely chronic lower extremity swelling. Continues on Lasix.  Chest pain, unspecified type-no recent episodes of chest pain.  Total time of encounter: 30 minutes total time of encounter, including 17 minutes spent in face-to-face patient care on the date of this encounter. This time includes coordination of care and  counseling regarding above mentioned problem list. Remainder of non-face-to-face time involved reviewing chart documents/testing relevant to the patient encounter and documentation in the medical record. I have independently reviewed documentation from referring provider.   Cherlynn Kaiser, MD [place]  [facility]    Medication Adjustments/Labs and Tests Ordered: Current medicines are reviewed at length with the patient today.  Concerns regarding medicines are outlined above.  No orders of the defined types were placed in this encounter.  No orders of the defined types were placed in this encounter.   Patient Instructions  Medication Instructions:  The current medical regimen is effective;  continue present plan and medications as directed. Please refer to the Current Medication list given to you today.  *If you need a refill on your cardiac medications before your next appointment, please call your pharmacy*  Follow-Up: Your next appointment:  6 month(s) Please call our office 2 months in advance to schedule this appointment In Person with Cherlynn Kaiser, MD  At [facility], you and your health needs are our priority.  As part of our continuing mission to provide you with exceptional heart care, we have created designated Provider Care Teams.  These Care Teams include your primary Cardiologist (physician) and Advanced Practice Providers (APPs -  Physician Assistants and Nurse Practitioners) who all work together to provide you with the care you need, when you need it.

## 2020-03-05 ENCOUNTER — Other Ambulatory Visit: Payer: Self-pay | Admitting: Endocrinology

## 2020-03-05 DIAGNOSIS — E1140 Type 2 diabetes mellitus with diabetic neuropathy, unspecified: Secondary | ICD-10-CM

## 2020-03-06 ENCOUNTER — Other Ambulatory Visit: Payer: Self-pay | Admitting: Rheumatology

## 2020-03-06 ENCOUNTER — Other Ambulatory Visit: Payer: Self-pay | Admitting: Family Medicine

## 2020-03-07 NOTE — Telephone Encounter (Signed)
Last Visit: 02/10/2020  Next Visit: message sent to the front desk to schedule, due 07/2020. Labs: 02/10/2020 She has had neutropenia in the past but now the white cell count is low. It is unusual to have neutropenia with colchicine although I discussed stopping colchicine and rechecking WBC count in 2 weeks. She does not want to stop colchicine.  02/29/2020 WBC 2.8, RBC 3.74, hemoglobin 11.5, MCV 100.3, CO2 21, glucose 300, creat 1.06, calcium 8.3, total protein 5.9, albumin 3.3, GFR 55.  Okay to refill colchicine?

## 2020-03-07 NOTE — Telephone Encounter (Signed)
Ok to refill [employer]

## 2020-03-09 ENCOUNTER — Telehealth: Payer: Self-pay | Admitting: Medical Oncology

## 2020-03-09 NOTE — Telephone Encounter (Signed)
Per Dr Jerrel Ivory okay to proceed with tooth extraction.

## 2020-03-10 ENCOUNTER — Other Ambulatory Visit: Payer: Self-pay

## 2020-03-10 ENCOUNTER — Inpatient Hospital Stay (HOSPITAL_BASED_OUTPATIENT_CLINIC_OR_DEPARTMENT_OTHER): Payer: Medicare Other | Admitting: Adult Health

## 2020-03-10 ENCOUNTER — Inpatient Hospital Stay: Payer: Medicare Other

## 2020-03-10 VITALS — BP 110/52 | HR 66 | Temp 98.3°F | Resp 17

## 2020-03-10 DIAGNOSIS — E119 Type 2 diabetes mellitus without complications: Secondary | ICD-10-CM | POA: Diagnosis not present

## 2020-03-10 DIAGNOSIS — E785 Hyperlipidemia, unspecified: Secondary | ICD-10-CM | POA: Diagnosis not present

## 2020-03-10 DIAGNOSIS — D72819 Decreased white blood cell count, unspecified: Secondary | ICD-10-CM | POA: Diagnosis not present

## 2020-03-10 DIAGNOSIS — K2100 Gastro-esophageal reflux disease with esophagitis, without bleeding: Secondary | ICD-10-CM | POA: Diagnosis not present

## 2020-03-10 DIAGNOSIS — D709 Neutropenia, unspecified: Secondary | ICD-10-CM

## 2020-03-10 DIAGNOSIS — Z9884 Bariatric surgery status: Secondary | ICD-10-CM | POA: Diagnosis not present

## 2020-03-10 DIAGNOSIS — D649 Anemia, unspecified: Secondary | ICD-10-CM | POA: Diagnosis not present

## 2020-03-10 DIAGNOSIS — D6489 Other specified anemias: Secondary | ICD-10-CM | POA: Diagnosis not present

## 2020-03-10 LAB — CBC WITH DIFFERENTIAL (CANCER CENTER ONLY)
Abs Immature Granulocytes: 0.01 10*3/uL (ref 0.00–0.07)
Basophils Absolute: 0 10*3/uL (ref 0.0–0.1)
Basophils Relative: 0 %
Eosinophils Absolute: 0.1 10*3/uL (ref 0.0–0.5)
Eosinophils Relative: 2 %
HCT: 34.4 % — ABNORMAL LOW (ref 36.0–46.0)
Hemoglobin: 10.8 g/dL — ABNORMAL LOW (ref 12.0–15.0)
Immature Granulocytes: 0 %
Lymphocytes Relative: 29 %
Lymphs Abs: 1.4 10*3/uL (ref 0.7–4.0)
MCH: 30.7 pg (ref 26.0–34.0)
MCHC: 31.4 g/dL (ref 30.0–36.0)
MCV: 97.7 fL (ref 80.0–100.0)
Monocytes Absolute: 0.6 10*3/uL (ref 0.1–1.0)
Monocytes Relative: 12 %
Neutro Abs: 2.7 10*3/uL (ref 1.7–7.7)
Neutrophils Relative %: 57 %
Platelet Count: 191 10*3/uL (ref 150–400)
RBC: 3.52 MIL/uL — ABNORMAL LOW (ref 3.87–5.11)
RDW: 15.3 % (ref 11.5–15.5)
WBC Count: 4.8 10*3/uL (ref 4.0–10.5)
nRBC: 0 % (ref 0.0–0.2)

## 2020-03-10 MED ORDER — LIDOCAINE HCL 2 % IJ SOLN
INTRAMUSCULAR | Status: AC
  Filled 2020-03-10: qty 20

## 2020-03-10 NOTE — Progress Notes (Signed)
Dressing dry & intact, no bleeding noted.  DC instructions reviewed with pt., verbalizes understanding.

## 2020-03-10 NOTE — Patient Instructions (Signed)
Bone Marrow Aspiration and Bone Marrow Biopsy, Adult, Care After This sheet gives you information about how to care for yourself after your procedure. Your health care provider may also give you more specific instructions. If you have problems or questions, contact your health care provider. What can I expect after the procedure? After the procedure, it is common to have:  Mild pain and tenderness.  Swelling.  Bruising. Follow these instructions at home: Puncture site care   Follow instructions from your health care provider about how to take care of the puncture site. Make sure you: ? Wash your hands with soap and water before and after you change your bandage (dressing). If soap and water are not available, use hand sanitizer. ? Change your dressing as told by your health care provider.  Check your puncture site every day for signs of infection. Check for: ? More redness, swelling, or pain. ? Fluid or blood. ? Warmth. ? Pus or a bad smell. Activity  Return to your normal activities as told by your health care provider. Ask your health care provider what activities are safe for you.  Do not lift anything that is heavier than 10 lb (4.5 kg), or the limit that you are told, until your health care provider says that it is safe.  Do not drive for 24 hours if you were given a sedative during your procedure. General instructions   Take over-the-counter and prescription medicines only as told by your health care provider.  Do not take baths, swim, or use a hot tub until your health care provider approves. Ask your health care provider if you may take showers. You may only be allowed to take sponge baths.  If directed, put ice on the affected area. To do this: ? Put ice in a plastic bag. ? Place a towel between your skin and the bag. ? Leave the ice on for 20 minutes, 2-3 times a day.  Keep all follow-up visits as told by your health care provider. This is important. Contact a  health care provider if:  Your pain is not controlled with medicine.  You have a fever.  You have more redness, swelling, or pain around the puncture site.  You have fluid or blood coming from the puncture site.  Your puncture site feels warm to the touch.  You have pus or a bad smell coming from the puncture site. Summary  After the procedure, it is common to have mild pain, tenderness, swelling, and bruising.  Follow instructions from your health care provider about how to take care of the puncture site and what activities are safe for you.  Take over-the-counter and prescription medicines only as told by your health care provider.  Contact a health care provider if you have any signs of infection, such as fluid or blood coming from the puncture site. This information is not intended to replace advice given to you by your health care provider. Make sure you discuss any questions you have with your health care provider. Document Revised: 03/17/2019 Document Reviewed: 03/17/2019 Elsevier Patient Education  2020 [employer]  

## 2020-03-10 NOTE — Progress Notes (Signed)
INDICATION: neutropenia, refractory anemia    Bone Marrow Biopsy and Aspiration Procedure Note   Informed consent was obtained and potential risks including bleeding, infection and pain were reviewed with the patient.  The patient's name, date of birth, identification, consent and allergies were verified prior to the start of procedure and time out was performed.  The right posterior iliac crest was chosen as the site of biopsy.  The skin was prepped with ChloraPrep.   8 cc of 2% lidocaine was used to provide local anaesthesia.   10 cc of bone marrow aspirate was obtained followed by 1cm biopsy.  Pressure was applied to the biopsy site and bandage was placed over the biopsy site. Patient was made to lie on the back for 30 mins prior to discharge.  The procedure was tolerated well. COMPLICATIONS: None BLOOD LOSS: none The patient was discharged home in stable condition with a 1 week follow up to review results.  Patient was provided with post bone marrow biopsy instructions and instructed to call if there was any bleeding or worsening pain.  Specimens sent for flow cytometry, cytogenetics and additional studies.  Signed Scot Dock, NP

## 2020-03-11 ENCOUNTER — Telehealth: Payer: Self-pay | Admitting: Adult Health

## 2020-03-11 LAB — SURGICAL PATHOLOGY

## 2020-03-11 NOTE — Progress Notes (Signed)
[place] OFFICE PROGRESS NOTE  Vivi Barrack, MD [place] [id number]  DIAGNOSIS: Anemia and leukocytopenia and occasional thrombocytopenia.  PRIOR THERAPY: None  CURRENT THERAPY: None  INTERVAL HISTORY: Savannah [name] 64 y.o. female returns to the clinic today for a follow-up visit regarding her neutropenia, anemia, and intermittent thrombocytopenia.  The patient is feeling fairly well today without any concerning complaints except for easy bruising on her upper extremities as well as her upper legs, which is troubling for her. She states this has been going on since around the age of 69. She notes associated thin skin and prolonged bleeding. She is on aspirin for her CAD but has only been taking that for 1 year and the easy bruising pre-dates her aspirin use.  She denies any other bleeding or bruising including epistaxis, hemoptysis, hematemesis, melena, nor hematochezia except she notes some mild gingival bleeding occasionally when she brushes her teeth.  She also notes that her urinalyses typically show the presence of blood. She has had several surgeries in the past including a hysterectomy and gastric bypass surgery without any major bleeding complications. She has a history of iron deficiency for which she takes a multivitamin BID with iron in it. She is unsure what the mg dosage is of iron. She is up to date on her endoscopy and colonoscopy which was performed in 2018. Her colonoscopy noted some internal hemorrhoids and a few polyps. Her endoscopy noted a few benign appearing gastric polyps. She had multiple lab studies performed including a vitamin B12, hepatitis, folate, HIV, which was unremarkable for an etiology of her lab abnormalities. The patient's father had myelodysplastic syndrome.  The patient recently underwent a bone marrow biopsy and aspirate and she is here today to review the results and have a more detailed discussion about her current condition  and treatment options    MEDICAL HISTORY: Past Medical History:  Diagnosis Date  . ABDOMINAL PAIN, CHRONIC 10/20/2009  . Acute cystitis 08/17/2009  . ALLERGIC RHINITIS 11/21/2007  . Allergy   . ASYMPTOMATIC POSTMENOPAUSAL STATUS 09/29/2008  . Bariatric surgery status 07/07/2010  . Bariatric surgery status 07/07/2010   Qualifier: Diagnosis of  By: Loanne Drilling MD, Jacelyn Pi   . Cancer ([place])    uterine  . Cataract   . Coronary artery disease   . DEPRESSION 11/21/2007  . DIABETES MELLITUS, TYPE II 07/08/2007  . DYSPNEA 05/20/2009  . Edema 05/20/2009  . Eosinophilic esophagitis 11/20/3788  . FEVER UNSPECIFIED 10/20/2009  . FEVER, HX OF 03/09/2010  . GERD (gastroesophageal reflux disease)   . Headache(784.0) 02/06/2010  . Hepatomegaly 01/06/2008  . HYPERLIPIDEMIA 07/08/2007  . HYPERTENSION 07/08/2007  . LEUKOPENIA, MILD 09/29/2008  . OBESITY 01/06/2008  . OSTEOARTHRITIS 01/06/2008  . Other chronic nonalcoholic liver disease 2/40/9735  . PERIPHERAL NEUROPATHY 11/21/2007  . Peripheral neuropathy   . Postsurgical hypothyroidism 09/19/2010  . SINUSITIS- ACUTE-NOS 11/21/2007  . TB SKIN TEST, POSITIVE 02/06/2010  . THYROID NODULE 03/09/2010    ALLERGIES:  is allergic to ace inhibitors; caffeine; ciprofloxacin; morphine and related; mucinex [guaifenesin er]; nsaids; and other.  MEDICATIONS:  Current Outpatient Medications  Medication Sig Dispense Refill  . aspirin EC 81 MG tablet Take 1 tablet (81 mg total) by mouth daily. Starting 01/18/19 prior to cardiac cath    . Biotin-D POWD Take 300 mg by mouth daily.     . canagliflozin (INVOKANA) 300 MG TABS tablet Take 1 tablet (300 mg total) by mouth daily before breakfast. 90  tablet 3  . Cholecalciferol (VITAMIN D3) 125 MCG (5000 UT) CAPS Take 5,000 Units by mouth daily.    [name] desloratadine (CLARINEX) 5 MG tablet Take 5 mg by mouth daily.    . diclofenac sodium (VOLTAREN) 1 % GEL Apply 4 g topically 4 (four) times daily. (Patient taking differently: Apply 4 g topically as  needed (pain). ) 100 g 11  . docusate sodium (COLACE) 100 MG capsule Take 200 mg by mouth 2 (two) times daily.    . Dulaglutide (TRULICITY) 1.5 ZG/0.1VC SOPN Inject 1.5 mg into the skin once a week. 12 pen 3  . halobetasol (ULTRAVATE) 0.05 % cream Apply 1 application topically 2 (two) times daily as needed (lichen sclerosus).   2  . HYDROcodone-acetaminophen (NORCO/VICODIN) 5-325 MG tablet Take 1 tablet by mouth daily as needed for moderate pain or severe pain.    [name] levothyroxine (SYNTHROID) 112 MCG tablet TAKE 1 TABLET BY MOUTH EVERY DAY (Patient taking differently: Take 112 mcg by mouth daily before breakfast. ) 90 tablet 3  . MAGNESIUM PO Take 1 tablet by mouth daily as needed (cramps).     . metFORMIN (GLUCOPHAGE) 1000 MG tablet Take 1,000 mg by mouth 2 (two) times daily with a meal.    . MITIGARE 0.6 MG CAPS TAKE 1 CAPSULE BY MOUTH EVERY DAY 90 capsule 0  . Multiple Vitamin (MULTIVITAMIN) tablet Take 2 tablets by mouth daily.     . nitroGLYCERIN (NITROSTAT) 0.4 MG SL tablet Place 1 tablet (0.4 mg total) under the tongue every 5 (five) minutes as needed. 25 tablet 3  . ONETOUCH ULTRA test strip USE TO MONITOR GLUCOSE LEVELS ONCE PER DAY E11.40 100 strip 2  . oxyCODONE-acetaminophen (PERCOCET) 5-325 MG tablet Take 1 tablet by mouth every 6 (six) hours as needed for severe pain. 30 tablet 0  . pantoprazole (PROTONIX) 40 MG tablet TAKE 1 TABLET BY MOUTH TWICE A DAY (Patient taking differently: Take 40 mg by mouth 2 (two) times daily. ) 180 tablet 1  . phenazopyridine (PYRIDIUM) 100 MG tablet Take 100 mg by mouth 3 (three) times daily as needed for pain.    . Probiotic CAPS Take 1 capsule by mouth daily.    . prochlorperazine (COMPAZINE) 10 MG tablet Take 1 tablet (10 mg total) by mouth every 6 (six) hours as needed (migraine). Take with imitrex and benadryl 30 tablet 0  . repaglinide (PRANDIN) 2 MG tablet Take 1 tablet (2 mg total) by mouth 2 (two) times daily before a meal. 180 tablet 3  .  traMADol (ULTRAM) 50 MG tablet Take 50 mg by mouth 2 (two) times daily as needed for moderate pain.    . TURMERIC CURCUMIN PO Take 1 capsule by mouth daily.    [name] atorvastatin (LIPITOR) 40 MG tablet Take 1 tablet (40 mg total) by mouth daily. 90 tablet 1  . furosemide (LASIX) 20 MG tablet Take 1 tablet (20 mg total) by mouth daily. (Patient taking differently: Take 20 mg by mouth daily as needed for fluid. ) 90 tablet 3  . SUMAtriptan (IMITREX) 25 MG tablet TAKE 1 TABLET BY MOUTH EVERY 2 HOURS AS NEEDED FOR MIGRAINE.TAKE WITH BENADRYL AND COMPAZINE 10 tablet 0   No current facility-administered medications for this visit.    SURGICAL HISTORY:  Past Surgical History:  Procedure Laterality Date  . ABDOMINAL HYSTERECTOMY    . BASAL CELL CARCINOMA EXCISION    . CARDIAC CATHETERIZATION    . COLONOSCOPY    . EYE SURGERY  Bilateral 08/2018, 09/2018  . FOOT SURGERY Left    10/2019  . KNEE ARTHROSCOPY Left   . LEFT HEART CATH AND CORONARY ANGIOGRAPHY N/A 01/20/2019   Procedure: LEFT HEART CATH AND CORONARY ANGIOGRAPHY;  Surgeon: Belva Crome, MD;  Location: [facility];  Service: Cardiovascular;  Laterality: N/A;  . LYMPH NODE DISSECTION    . OOPHORECTOMY    . Right total knee replacement    . ROUX-EN-Y GASTRIC BYPASS  2011  . SKIN TAG REMOVAL    . THYROIDECTOMY    . TONSILLECTOMY AND ADENOIDECTOMY    . UPPER GASTROINTESTINAL ENDOSCOPY      REVIEW OF SYSTEMS:   Review of Systems  Constitutional: Negative for appetite change, chills, fatigue, fever and unexpected weight change.  HENT: Positive for occasional gingival bleeding.  Negative for mouth sores, nosebleeds, sore throat and trouble swallowing.   Eyes: Negative for eye problems and icterus.  Respiratory: Negative for cough, hemoptysis, shortness of breath and wheezing.  Cardiovascular: Negative for chest pain and leg swelling.  Gastrointestinal: Negative for abdominal pain, constipation, diarrhea, nausea and vomiting.   Genitourinary: Negative for bladder incontinence, difficulty urinating, dysuria, frequency and hematuria.   Musculoskeletal: Negative for back pain, gait problem, neck pain and neck stiffness.  Skin: Negative for itching and rash.  Neurological: Negative for dizziness, extremity weakness, gait problem, headaches, light-headedness and seizures.  Hematological: Positive for bruising on both upper extremities bilaterally and around the neck. Psychiatric/Behavioral: Negative for confusion, depression and sleep disturbance. The patient is not nervous/anxious.     PHYSICAL EXAMINATION:  Blood pressure 103/63, pulse 72, temperature 98.2 F (36.8 C), temperature source Temporal, resp. rate 18, height 5' 11"  (1.803 m), weight 163 lb (73.9 kg), SpO2 100 %.  ECOG PERFORMANCE STATUS: 1 - Symptomatic but completely ambulatory  Physical Exam  Constitutional: Oriented to person, place, and time and well-developed, well-nourished, and in no distress.  HENT:  Head: Normocephalic and atraumatic.  Mouth/Throat: Oropharynx is clear and moist. No oropharyngeal exudate.  Eyes: Conjunctivae are normal. Right eye exhibits no discharge. Left eye exhibits no discharge. No scleral icterus.  Neck: Normal range of motion. Neck supple.  Cardiovascular: Normal rate, regular rhythm, normal heart sounds and intact distal pulses.   Pulmonary/Chest: Effort normal and breath sounds normal. No respiratory distress. No wheezes. No rales.  Abdominal: Soft. Bowel sounds are normal. Exhibits no distension and no mass. There is no tenderness.  Musculoskeletal: Positive for bilaterally lower extremity swelling L>R (stable per patient report). Normal range of motion.  Lymphadenopathy:    No cervical adenopathy.  Neurological: Alert and oriented to person, place, and time. Exhibits normal muscle tone. Gait normal. Coordination normal.  Skin: Positive for bruising on upper extremities bilaterally. Skin is warm and dry. No rash  noted. Not diaphoretic. No erythema. No pallor.  Psychiatric: Mood, memory and judgment normal.  Vitals reviewed.  LABORATORY DATA: Lab Results  Component Value Date   WBC 4.8 03/10/2020   HGB 10.8 (L) 03/10/2020   HCT 34.4 (L) 03/10/2020   MCV 97.7 03/10/2020   PLT 191 03/10/2020      Chemistry      Component Value Date/Time   NA 139 02/29/2020 1303   NA 144 07/06/2019 1524   K 4.1 02/29/2020 1303   CL 111 02/29/2020 1303   CO2 21 (L) 02/29/2020 1303   BUN 12 02/29/2020 1303   BUN 22 07/06/2019 1524   CREATININE 1.06 (H) 02/29/2020 1303   CREATININE 1.06 (H) 02/10/2020  1157      Component Value Date/Time   CALCIUM 8.3 (L) 02/29/2020 1303   CALCIUM 8.9 01/30/2013 1017   ALKPHOS 125 02/29/2020 1303   AST 25 02/29/2020 1303   ALT 37 02/29/2020 1303   BILITOT 0.4 02/29/2020 1303       RADIOGRAPHIC STUDIES:  DG Chest 2 View  Result Date: 02/26/2020 CLINICAL DATA:  Shortness of breath. EXAM: CHEST - 2 VIEW COMPARISON:  Radiograph 618 FINDINGS: The cardiomediastinal contours are normal. The lungs are clear. Pulmonary vasculature is normal. No consolidation, pleural effusion, or pneumothorax. No acute osseous abnormalities are seen. Surgical clips at the thoracic inlet. IMPRESSION: No acute chest findings. Electronically Signed   By: Keith Rake M.D.   On: 02/26/2020 21:34   LE VENOUS  Result Date: 02/27/2020  Lower Venous DVTStudy Indications: Pain, and bruise mid calf. Patient felt a burning sensation in calf and bruise appeared just after.  Comparison Study: No prior study on file Performing Technologist: Sharion Dove RVS  Examination Guidelines: A complete evaluation includes B-mode imaging, spectral Doppler, color Doppler, and power Doppler as needed of all accessible portions of each vessel. Bilateral testing is considered an integral part of a complete examination. Limited examinations for reoccurring indications may be performed as noted. The reflux portion of  the exam is performed with the patient in reverse Trendelenburg.  +-----+---------------+---------+-----------+----------+--------------+ RIGHTCompressibilityPhasicitySpontaneityPropertiesThrombus Aging +-----+---------------+---------+-----------+----------+--------------+ CFV  Full           Yes      Yes                                 +-----+---------------+---------+-----------+----------+--------------+   +---------+---------------+---------+-----------+----------+--------------+ LEFT     CompressibilityPhasicitySpontaneityPropertiesThrombus Aging +---------+---------------+---------+-----------+----------+--------------+ CFV      Full           Yes      Yes                                 +---------+---------------+---------+-----------+----------+--------------+ SFJ      Full                                                        +---------+---------------+---------+-----------+----------+--------------+ FV Prox  Full                                                        +---------+---------------+---------+-----------+----------+--------------+ FV Mid   Full                                                        +---------+---------------+---------+-----------+----------+--------------+ FV DistalFull                                                        +---------+---------------+---------+-----------+----------+--------------+  PFV      Full                                                        +---------+---------------+---------+-----------+----------+--------------+ POP      Full           Yes      Yes                                 +---------+---------------+---------+-----------+----------+--------------+ PTV      Full                                                        +---------+---------------+---------+-----------+----------+--------------+ PERO     Full                                                         +---------+---------------+---------+-----------+----------+--------------+     Summary: RIGHT: - No evidence of common femoral vein obstruction.  LEFT: - anechoic area noted at site of bruise, possibly consistent with muscle tear.  *See table(s) above for measurements and observations. Electronically signed by Monica Martinez MD on 02/27/2020 at 3:13:45 PM.    Final      ASSESSMENT/PLAN:  This is a very pleasant 64 year old Caucasian female referred to the clinic for evaluation of intermittent neutropenia, anemia, and/or thrombocytopenia.  The patient was recently seen with Dr. Julien Nordmann.  The patient recently had a bone marrow biopsy and aspirate performed to further evaluate her condition.  The biopsy showed hypocellular bone marrow for age with trilineage hematopoiesis and decreased iron stores. Cytogenetics is pending at this time. We will arrange for ferritin and iron studies to be performed at this time.   If the patient's iron studies are significantly low, we will likely recommend IV iron infusion. Given her history of gastric bypass, she may have malabsorption of iron. If her iron is slightly low, we may recommend p.o. iron with integra plus. I will personally call the patient depending on the lab studies to relay the instructions.   The patient is concerned about her easy bruising on her extremities. She is concerned about a coagulopathy. She is interested in seeing a specialist. Dr. Julien Nordmann recommended Dr. Gareth Morgan. I will place the referral.   We will see her back for a follow up visit in 3 months for evaluation and repeat CBC, iron studies, and ferritin.   The patient was advised to call immediately if she has any concerning symptoms in the interval. The patient voices understanding of current disease status and treatment options and is in agreement with the current care plan. All questions were answered. The patient knows to call the clinic with any problems, questions or concerns.  We can certainly see the patient much sooner if necessary    Orders Placed This Encounter  Procedures  . CBC with Differential (Cancer Center Only)    Standing Status:   Future  Standing Expiration Date:   03/14/2021  . Ferritin    Standing Status:   Future    Standing Expiration Date:   03/14/2021  . Iron and TIBC    Standing Status:   Future    Standing Expiration Date:   03/14/2021  . Ambulatory referral to Hematology / Oncology    Referral Priority:   Routine    Referral Type:   Consultation    Referral Reason:   Specialty Services Required    Requested Specialty:   Hematology    Number of Visits Requested:   [place], PA-C 03/14/20   ADDENDUM: Hematology/Oncology Attending: I had a face-to-face encounter with the patient today.  I recommended her care plan.  This is a 64 years old white female presented for evaluation of intermittent pancytopenia.  The patient had a bone marrow biopsy and aspirate performed recently.  I discussed the bone marrow results with the patient today did not show any concerning findings for myeloproliferative/myelodysplastic syndrome.  There was no other abnormalities seen on the bone marrow. She had iron study performed earlier today that showed evidence for iron deficiency anemia.  She has been using over-the-counter iron tablets with no significant improvement.  We will give the patient a prescription for Integra +1 capsule p.o. daily. She also mention easy bruising but she has no history of bleeding issues in the past during her surgery or any mucous membrane hemorrhage.  I recommended for the patient to see Dr. Gareth Morgan at [facility] for her coagulopathy and easy bruising issues. We will see the patient back for follow-up visit in 3 months for evaluation with repeat CBC, iron study and ferritin. She was advised to call if she has any other concerning symptoms in the interval.  Disclaimer: This note was dictated with  voice recognition software. Similar sounding words can inadvertently be transcribed and may be missed upon review. Eilleen Kempf, MD 03/14/20

## 2020-03-11 NOTE — Telephone Encounter (Signed)
No 4/29 LOS. No changes made to pt's schedule.

## 2020-03-12 ENCOUNTER — Other Ambulatory Visit: Payer: Self-pay | Admitting: Family Medicine

## 2020-03-13 ENCOUNTER — Other Ambulatory Visit: Payer: Self-pay | Admitting: Physician Assistant

## 2020-03-13 DIAGNOSIS — D539 Nutritional anemia, unspecified: Secondary | ICD-10-CM

## 2020-03-14 ENCOUNTER — Inpatient Hospital Stay: Payer: Medicare Other | Attending: Physician Assistant | Admitting: Physician Assistant

## 2020-03-14 ENCOUNTER — Telehealth: Payer: Self-pay | Admitting: Physician Assistant

## 2020-03-14 ENCOUNTER — Inpatient Hospital Stay: Payer: Medicare Other

## 2020-03-14 ENCOUNTER — Encounter: Payer: Self-pay | Admitting: Physician Assistant

## 2020-03-14 ENCOUNTER — Other Ambulatory Visit: Payer: Self-pay

## 2020-03-14 ENCOUNTER — Other Ambulatory Visit: Payer: Self-pay | Admitting: Physician Assistant

## 2020-03-14 ENCOUNTER — Ambulatory Visit (INDEPENDENT_AMBULATORY_CARE_PROVIDER_SITE_OTHER): Payer: Medicare Other | Admitting: Podiatry

## 2020-03-14 ENCOUNTER — Ambulatory Visit (INDEPENDENT_AMBULATORY_CARE_PROVIDER_SITE_OTHER): Payer: Medicare Other

## 2020-03-14 VITALS — BP 103/63 | HR 72 | Temp 98.2°F | Resp 18 | Ht 71.0 in | Wt 163.0 lb

## 2020-03-14 DIAGNOSIS — K219 Gastro-esophageal reflux disease without esophagitis: Secondary | ICD-10-CM | POA: Diagnosis not present

## 2020-03-14 DIAGNOSIS — I2510 Atherosclerotic heart disease of native coronary artery without angina pectoris: Secondary | ICD-10-CM | POA: Diagnosis not present

## 2020-03-14 DIAGNOSIS — Z7984 Long term (current) use of oral hypoglycemic drugs: Secondary | ICD-10-CM | POA: Diagnosis not present

## 2020-03-14 DIAGNOSIS — Z9884 Bariatric surgery status: Secondary | ICD-10-CM | POA: Insufficient documentation

## 2020-03-14 DIAGNOSIS — Z96651 Presence of right artificial knee joint: Secondary | ICD-10-CM | POA: Diagnosis not present

## 2020-03-14 DIAGNOSIS — I10 Essential (primary) hypertension: Secondary | ICD-10-CM | POA: Insufficient documentation

## 2020-03-14 DIAGNOSIS — Z85828 Personal history of other malignant neoplasm of skin: Secondary | ICD-10-CM | POA: Diagnosis not present

## 2020-03-14 DIAGNOSIS — D509 Iron deficiency anemia, unspecified: Secondary | ICD-10-CM | POA: Diagnosis not present

## 2020-03-14 DIAGNOSIS — D539 Nutritional anemia, unspecified: Secondary | ICD-10-CM | POA: Diagnosis not present

## 2020-03-14 DIAGNOSIS — D72819 Decreased white blood cell count, unspecified: Secondary | ICD-10-CM | POA: Diagnosis present

## 2020-03-14 DIAGNOSIS — S92002A Unspecified fracture of left calcaneus, initial encounter for closed fracture: Secondary | ICD-10-CM | POA: Diagnosis not present

## 2020-03-14 DIAGNOSIS — D692 Other nonthrombocytopenic purpura: Secondary | ICD-10-CM | POA: Diagnosis not present

## 2020-03-14 DIAGNOSIS — D508 Other iron deficiency anemias: Secondary | ICD-10-CM

## 2020-03-14 DIAGNOSIS — Z79899 Other long term (current) drug therapy: Secondary | ICD-10-CM | POA: Insufficient documentation

## 2020-03-14 DIAGNOSIS — Z90710 Acquired absence of both cervix and uterus: Secondary | ICD-10-CM | POA: Diagnosis not present

## 2020-03-14 DIAGNOSIS — E119 Type 2 diabetes mellitus without complications: Secondary | ICD-10-CM | POA: Insufficient documentation

## 2020-03-14 DIAGNOSIS — Z90721 Acquired absence of ovaries, unilateral: Secondary | ICD-10-CM | POA: Diagnosis not present

## 2020-03-14 DIAGNOSIS — Z7982 Long term (current) use of aspirin: Secondary | ICD-10-CM | POA: Insufficient documentation

## 2020-03-14 LAB — IRON AND TIBC
Iron: 45 ug/dL (ref 41–142)
Saturation Ratios: 11 % — ABNORMAL LOW (ref 21–57)
TIBC: 413 ug/dL (ref 236–444)
UIBC: 368 ug/dL (ref 120–384)

## 2020-03-14 LAB — FERRITIN: Ferritin: 8 ng/mL — ABNORMAL LOW (ref 11–307)

## 2020-03-14 NOTE — Telephone Encounter (Signed)
Called the patient to let her know her ferritin is low at 8. She has a history of gastric bypass and malabsorption, I will arrange for her to have iron infusions starting ~5/7. She expressed understanding.

## 2020-03-15 ENCOUNTER — Telehealth: Payer: Self-pay | Admitting: Physician Assistant

## 2020-03-15 ENCOUNTER — Other Ambulatory Visit: Payer: Self-pay | Admitting: Physician Assistant

## 2020-03-15 NOTE — Telephone Encounter (Signed)
Scheduled appt per 5/3 sch message - unable to reach pt . Left message with appt date and time

## 2020-03-15 NOTE — Telephone Encounter (Signed)
Scheduled per los. Called and left msg. Mailed printout  °

## 2020-03-16 NOTE — Progress Notes (Signed)
   HPI: 64 y.o. female presenting today status post plantar fasciectomy with excision of plantar heel spur left foot. DOS: 10/22/2019. She subsequently had a fracture of the calcaneus after a 'hard step' while wearing her CAM boot the day after her surgery. DOI 10/23/2019. She states her condition is relatively unchanged. She reports associated swelling. She has been in physical therapy which she reports is helping. There are no worsening factors noted. Patient is here for further evaluation and treatment.   Past Medical History:  Diagnosis Date  . ABDOMINAL PAIN, CHRONIC 10/20/2009  . Acute cystitis 08/17/2009  . ALLERGIC RHINITIS 11/21/2007  . Allergy   . ASYMPTOMATIC POSTMENOPAUSAL STATUS 09/29/2008  . Bariatric surgery status 07/07/2010  . Bariatric surgery status 07/07/2010   Qualifier: Diagnosis of  By: Loanne Drilling MD, Jacelyn Pi   . Cancer ([place])    uterine  . Cataract   . Coronary artery disease   . DEPRESSION 11/21/2007  . DIABETES MELLITUS, TYPE II 07/08/2007  . DYSPNEA 05/20/2009  . Edema 05/20/2009  . Eosinophilic esophagitis 04/15/8755  . FEVER UNSPECIFIED 10/20/2009  . FEVER, HX OF 03/09/2010  . GERD (gastroesophageal reflux disease)   . Headache(784.0) 02/06/2010  . Hepatomegaly 01/06/2008  . HYPERLIPIDEMIA 07/08/2007  . HYPERTENSION 07/08/2007  . LEUKOPENIA, MILD 09/29/2008  . OBESITY 01/06/2008  . OSTEOARTHRITIS 01/06/2008  . Other chronic nonalcoholic liver disease 4/33/2951  . PERIPHERAL NEUROPATHY 11/21/2007  . Peripheral neuropathy   . Postsurgical hypothyroidism 09/19/2010  . SINUSITIS- ACUTE-NOS 11/21/2007  . TB SKIN TEST, POSITIVE 02/06/2010  . THYROID NODULE 03/09/2010     Physical Exam: General: The patient is alert and oriented x3 in no acute distress.  Dermatology: Skin incision to the lateral aspect of the heel is intact with staples intact.  There is some mild maceration noted around the incision site.  Vascular: Palpable pedal pulses bilaterally.  Minimal edema noted to the  heel with improved ecchymosis. Capillary refill within normal limits.  Neurological: Epicritic and protective threshold grossly intact bilaterally.   Musculoskeletal Exam: Status post plantar heel spur exostectomy with partial plantar fasciectomy.  Radiographic exam: Fracture noted through the body of the calcaneus with evidence of callus formation and healing.  Assessment: 1. Status post plantar heel exostectomy with partial plantar fasciectomy left DOS: 10/22/2019 2. Calcaneal fracture left, stable with routine healing   Plan of Care:  1. Patient evaluated. X-Rays reviewed.  2. May resume full activity with no restrictions.  3. Recommended shoes from [employer].  4. Return to clinic as needed.      Edrick Kins, DPM [facility]  Dr. Edrick Kins, DPM    [address], [place] [address]                Office [phone]  Fax [phone]

## 2020-03-18 ENCOUNTER — Telehealth: Payer: Self-pay | Admitting: Family Medicine

## 2020-03-18 ENCOUNTER — Inpatient Hospital Stay: Payer: Medicare Other

## 2020-03-18 ENCOUNTER — Other Ambulatory Visit: Payer: Self-pay

## 2020-03-18 VITALS — BP 112/75 | HR 63 | Temp 97.9°F | Resp 18

## 2020-03-18 DIAGNOSIS — M25472 Effusion, left ankle: Secondary | ICD-10-CM | POA: Diagnosis not present

## 2020-03-18 DIAGNOSIS — E119 Type 2 diabetes mellitus without complications: Secondary | ICD-10-CM | POA: Diagnosis not present

## 2020-03-18 DIAGNOSIS — K219 Gastro-esophageal reflux disease without esophagitis: Secondary | ICD-10-CM | POA: Diagnosis not present

## 2020-03-18 DIAGNOSIS — I10 Essential (primary) hypertension: Secondary | ICD-10-CM | POA: Diagnosis not present

## 2020-03-18 DIAGNOSIS — D508 Other iron deficiency anemias: Secondary | ICD-10-CM

## 2020-03-18 DIAGNOSIS — Z7982 Long term (current) use of aspirin: Secondary | ICD-10-CM | POA: Diagnosis not present

## 2020-03-18 DIAGNOSIS — D509 Iron deficiency anemia, unspecified: Secondary | ICD-10-CM | POA: Diagnosis not present

## 2020-03-18 DIAGNOSIS — Z85828 Personal history of other malignant neoplasm of skin: Secondary | ICD-10-CM | POA: Diagnosis not present

## 2020-03-18 MED ORDER — ACETAMINOPHEN 325 MG PO TABS
ORAL_TABLET | ORAL | Status: AC
  Filled 2020-03-18: qty 2

## 2020-03-18 MED ORDER — DIPHENHYDRAMINE HCL 25 MG PO CAPS
50.0000 mg | ORAL_CAPSULE | Freq: Once | ORAL | Status: AC
  Administered 2020-03-18: 50 mg via ORAL

## 2020-03-18 MED ORDER — ACETAMINOPHEN 325 MG PO TABS
650.0000 mg | ORAL_TABLET | Freq: Once | ORAL | Status: AC
  Administered 2020-03-18: 650 mg via ORAL

## 2020-03-18 MED ORDER — DIPHENHYDRAMINE HCL 25 MG PO CAPS
ORAL_CAPSULE | ORAL | Status: AC
  Filled 2020-03-18: qty 2

## 2020-03-18 MED ORDER — SODIUM CHLORIDE 0.9 % IV SOLN
Freq: Once | INTRAVENOUS | Status: AC
  Filled 2020-03-18: qty 250

## 2020-03-18 MED ORDER — SODIUM CHLORIDE 0.9 % IV SOLN
200.0000 mg | Freq: Once | INTRAVENOUS | Status: AC
  Administered 2020-03-18: 200 mg via INTRAVENOUS
  Filled 2020-03-18: qty 200

## 2020-03-18 NOTE — Patient Instructions (Signed)

## 2020-03-18 NOTE — Telephone Encounter (Signed)
Nurse Assessment Nurse: Toy Cookey, RN, Stanton Kidney Date/Time Eilene Ghazi Time): 03/18/2020 3:11:44 PM Confirm and document reason for call. If symptomatic, describe symptoms. ---Caller states her toes are yellow on the left foot. Started last night. Foot is swelling and leg up to knee. Top of foot looks bruised. Has the patient had close contact with a person known or suspected to have the novel coronavirus illness OR traveled / lives in area with major community spread (including international travel) in the last 14 days from the onset of symptoms? * If Asymptomatic, screen for exposure and travel within the last 14 days. ---No Does the patient have any new or worsening symptoms? ---Yes Will a triage be completed? ---Yes Related visit to physician within the last 2 weeks? ---Yes Does the PT have any chronic conditions? (i.e. diabetes, asthma, this includes High risk factors for pregnancy, etc.) ---Yes List chronic conditions. ---Diabetes, CARD, Is this a behavioral health or substance abuse call? ---No Guidelines Guideline Title Affirmed Question Affirmed Notes Nurse Date/Time Eilene Ghazi Time) Leg Swelling and Edema [1] Thigh, calf, or ankle swelling AND [2] only 1 side KAYLAN, YATES 03/18/2020 3:14:23 PM Disp. Time (Eastern Time) Disposition Final UserPLEASE NOTE: All timestamps contained within this report are represented as [place] Standard Time. CONFIDENTIALTY NOTICE: This fax transmission is intended only for the addressee. It contains information that is legally privileged, confidential or otherwise protected from use or disclosure. If you are not the intended recipient, you are strictly prohibited from reviewing, disclosing, copying using or disseminating any of this information or taking any action in reliance on or regarding this information. If you have received this fax in error, please notify us immediately by telephone so that we can arrange for its return to [place]. Phone: [phone],  Toll-Free: [phone], Fax: [phone] Page: 2 of 2 Call Id: [id number] 03/18/2020 3:19:28 PM See HCP within 4 Hours (or PCP triage) Yes Toy Cookey, RN, Nemiah Commander Disagree/Comply Comply Caller Understands Yes PreDisposition InappropriateToAsk Care Advice Given Per Guideline SEE HCP WITHIN 4 HOURS (OR PCP TRIAGE): * IF OFFICE WILL BE OPEN: You need to be seen within the next 3 or 4 hours. Call your doctor (or NP/PA) now or as soon as the office opens. CALL EMS IF: * Chest pain or shortness of breath occurs. CARE ADVICE given per Leg Swelling and Edema (Adult) guideline.

## 2020-03-18 NOTE — Telephone Encounter (Signed)
Pt stated doing iron infusion now and will go to Urgent care after the infusion as advise

## 2020-03-21 ENCOUNTER — Encounter (HOSPITAL_COMMUNITY): Payer: Self-pay | Admitting: Physician Assistant

## 2020-03-21 NOTE — Telephone Encounter (Signed)
Noted. Agree with plan.  Savannah [name]. Jerline Pain, MD 03/21/2020 8:12 AM

## 2020-03-22 ENCOUNTER — Other Ambulatory Visit: Payer: Self-pay | Admitting: Family Medicine

## 2020-03-22 DIAGNOSIS — M25572 Pain in left ankle and joints of left foot: Secondary | ICD-10-CM | POA: Diagnosis not present

## 2020-03-22 DIAGNOSIS — R269 Unspecified abnormalities of gait and mobility: Secondary | ICD-10-CM | POA: Diagnosis not present

## 2020-03-22 DIAGNOSIS — M25551 Pain in right hip: Secondary | ICD-10-CM | POA: Diagnosis not present

## 2020-03-22 DIAGNOSIS — M25672 Stiffness of left ankle, not elsewhere classified: Secondary | ICD-10-CM | POA: Diagnosis not present

## 2020-03-22 DIAGNOSIS — E118 Type 2 diabetes mellitus with unspecified complications: Secondary | ICD-10-CM | POA: Diagnosis not present

## 2020-03-22 DIAGNOSIS — M6259 Muscle wasting and atrophy, not elsewhere classified, multiple sites: Secondary | ICD-10-CM | POA: Diagnosis not present

## 2020-03-22 DIAGNOSIS — M25472 Effusion, left ankle: Secondary | ICD-10-CM | POA: Diagnosis not present

## 2020-03-23 ENCOUNTER — Other Ambulatory Visit: Payer: Self-pay | Admitting: Family Medicine

## 2020-03-23 ENCOUNTER — Telehealth: Payer: Self-pay | Admitting: *Deleted

## 2020-03-23 NOTE — Telephone Encounter (Signed)
LAST APPOINTMENT DATE: 11/03/2019  NEXT APPOINTMENT DATE: 05/04/2020   RX Gabapentin LAST REFILL: Rx was discontinue.   QTY:

## 2020-03-23 NOTE — Telephone Encounter (Signed)
Referral called to Dr Joan Flores at [facility] for coagulopathy.   Office notes, labs and demographics faxed to [phone]

## 2020-03-25 ENCOUNTER — Other Ambulatory Visit: Payer: Self-pay

## 2020-03-25 ENCOUNTER — Inpatient Hospital Stay: Payer: Medicare Other

## 2020-03-25 VITALS — BP 97/53 | HR 68 | Temp 98.5°F | Resp 17

## 2020-03-25 DIAGNOSIS — E118 Type 2 diabetes mellitus with unspecified complications: Secondary | ICD-10-CM | POA: Diagnosis not present

## 2020-03-25 DIAGNOSIS — M25551 Pain in right hip: Secondary | ICD-10-CM | POA: Diagnosis not present

## 2020-03-25 DIAGNOSIS — M25572 Pain in left ankle and joints of left foot: Secondary | ICD-10-CM | POA: Diagnosis not present

## 2020-03-25 DIAGNOSIS — D509 Iron deficiency anemia, unspecified: Secondary | ICD-10-CM | POA: Diagnosis not present

## 2020-03-25 DIAGNOSIS — K219 Gastro-esophageal reflux disease without esophagitis: Secondary | ICD-10-CM | POA: Diagnosis not present

## 2020-03-25 DIAGNOSIS — M25672 Stiffness of left ankle, not elsewhere classified: Secondary | ICD-10-CM | POA: Diagnosis not present

## 2020-03-25 DIAGNOSIS — D508 Other iron deficiency anemias: Secondary | ICD-10-CM

## 2020-03-25 DIAGNOSIS — I10 Essential (primary) hypertension: Secondary | ICD-10-CM | POA: Diagnosis not present

## 2020-03-25 DIAGNOSIS — Z7982 Long term (current) use of aspirin: Secondary | ICD-10-CM | POA: Diagnosis not present

## 2020-03-25 DIAGNOSIS — E119 Type 2 diabetes mellitus without complications: Secondary | ICD-10-CM | POA: Diagnosis not present

## 2020-03-25 DIAGNOSIS — M6259 Muscle wasting and atrophy, not elsewhere classified, multiple sites: Secondary | ICD-10-CM | POA: Diagnosis not present

## 2020-03-25 DIAGNOSIS — M25472 Effusion, left ankle: Secondary | ICD-10-CM | POA: Diagnosis not present

## 2020-03-25 DIAGNOSIS — Z85828 Personal history of other malignant neoplasm of skin: Secondary | ICD-10-CM | POA: Diagnosis not present

## 2020-03-25 DIAGNOSIS — R269 Unspecified abnormalities of gait and mobility: Secondary | ICD-10-CM | POA: Diagnosis not present

## 2020-03-25 MED ORDER — SODIUM CHLORIDE 0.9 % IV SOLN
200.0000 mg | Freq: Once | INTRAVENOUS | Status: AC
  Administered 2020-03-25: 200 mg via INTRAVENOUS
  Filled 2020-03-25: qty 10

## 2020-03-25 MED ORDER — DIPHENHYDRAMINE HCL 25 MG PO CAPS
50.0000 mg | ORAL_CAPSULE | Freq: Once | ORAL | Status: AC
  Administered 2020-03-25: 50 mg via ORAL

## 2020-03-25 MED ORDER — ACETAMINOPHEN 325 MG PO TABS
ORAL_TABLET | ORAL | Status: AC
  Filled 2020-03-25: qty 2

## 2020-03-25 MED ORDER — SODIUM CHLORIDE 0.9 % IV SOLN
Freq: Once | INTRAVENOUS | Status: AC
  Filled 2020-03-25: qty 250

## 2020-03-25 MED ORDER — ACETAMINOPHEN 325 MG PO TABS
650.0000 mg | ORAL_TABLET | Freq: Once | ORAL | Status: AC
  Administered 2020-03-25: 650 mg via ORAL

## 2020-03-25 MED ORDER — DIPHENHYDRAMINE HCL 25 MG PO CAPS
ORAL_CAPSULE | ORAL | Status: AC
  Filled 2020-03-25: qty 2

## 2020-03-29 DIAGNOSIS — M6259 Muscle wasting and atrophy, not elsewhere classified, multiple sites: Secondary | ICD-10-CM | POA: Diagnosis not present

## 2020-03-29 DIAGNOSIS — M25472 Effusion, left ankle: Secondary | ICD-10-CM | POA: Diagnosis not present

## 2020-03-29 DIAGNOSIS — M25572 Pain in left ankle and joints of left foot: Secondary | ICD-10-CM | POA: Diagnosis not present

## 2020-03-29 DIAGNOSIS — R269 Unspecified abnormalities of gait and mobility: Secondary | ICD-10-CM | POA: Diagnosis not present

## 2020-03-29 DIAGNOSIS — E118 Type 2 diabetes mellitus with unspecified complications: Secondary | ICD-10-CM | POA: Diagnosis not present

## 2020-03-29 DIAGNOSIS — M25672 Stiffness of left ankle, not elsewhere classified: Secondary | ICD-10-CM | POA: Diagnosis not present

## 2020-03-29 DIAGNOSIS — M25551 Pain in right hip: Secondary | ICD-10-CM | POA: Diagnosis not present

## 2020-04-01 ENCOUNTER — Inpatient Hospital Stay: Payer: Medicare Other

## 2020-04-01 ENCOUNTER — Other Ambulatory Visit: Payer: Self-pay

## 2020-04-01 VITALS — BP 113/62 | HR 71 | Temp 98.3°F | Resp 18

## 2020-04-01 DIAGNOSIS — D509 Iron deficiency anemia, unspecified: Secondary | ICD-10-CM | POA: Diagnosis not present

## 2020-04-01 DIAGNOSIS — Z7982 Long term (current) use of aspirin: Secondary | ICD-10-CM | POA: Diagnosis not present

## 2020-04-01 DIAGNOSIS — M6259 Muscle wasting and atrophy, not elsewhere classified, multiple sites: Secondary | ICD-10-CM | POA: Diagnosis not present

## 2020-04-01 DIAGNOSIS — R269 Unspecified abnormalities of gait and mobility: Secondary | ICD-10-CM | POA: Diagnosis not present

## 2020-04-01 DIAGNOSIS — E119 Type 2 diabetes mellitus without complications: Secondary | ICD-10-CM | POA: Diagnosis not present

## 2020-04-01 DIAGNOSIS — M25472 Effusion, left ankle: Secondary | ICD-10-CM | POA: Diagnosis not present

## 2020-04-01 DIAGNOSIS — M25551 Pain in right hip: Secondary | ICD-10-CM | POA: Diagnosis not present

## 2020-04-01 DIAGNOSIS — E118 Type 2 diabetes mellitus with unspecified complications: Secondary | ICD-10-CM | POA: Diagnosis not present

## 2020-04-01 DIAGNOSIS — Z85828 Personal history of other malignant neoplasm of skin: Secondary | ICD-10-CM | POA: Diagnosis not present

## 2020-04-01 DIAGNOSIS — I10 Essential (primary) hypertension: Secondary | ICD-10-CM | POA: Diagnosis not present

## 2020-04-01 DIAGNOSIS — M25672 Stiffness of left ankle, not elsewhere classified: Secondary | ICD-10-CM | POA: Diagnosis not present

## 2020-04-01 DIAGNOSIS — D508 Other iron deficiency anemias: Secondary | ICD-10-CM

## 2020-04-01 DIAGNOSIS — M25572 Pain in left ankle and joints of left foot: Secondary | ICD-10-CM | POA: Diagnosis not present

## 2020-04-01 DIAGNOSIS — K219 Gastro-esophageal reflux disease without esophagitis: Secondary | ICD-10-CM | POA: Diagnosis not present

## 2020-04-01 MED ORDER — DIPHENHYDRAMINE HCL 25 MG PO CAPS
50.0000 mg | ORAL_CAPSULE | Freq: Once | ORAL | Status: AC
  Administered 2020-04-01: 50 mg via ORAL

## 2020-04-01 MED ORDER — SODIUM CHLORIDE 0.9 % IV SOLN
200.0000 mg | Freq: Once | INTRAVENOUS | Status: AC
  Administered 2020-04-01: 200 mg via INTRAVENOUS
  Filled 2020-04-01: qty 200

## 2020-04-01 MED ORDER — DIPHENHYDRAMINE HCL 25 MG PO CAPS
ORAL_CAPSULE | ORAL | Status: AC
  Filled 2020-04-01: qty 2

## 2020-04-01 MED ORDER — SODIUM CHLORIDE 0.9 % IV SOLN
Freq: Once | INTRAVENOUS | Status: AC
  Filled 2020-04-01: qty 250

## 2020-04-01 MED ORDER — ACETAMINOPHEN 325 MG PO TABS
650.0000 mg | ORAL_TABLET | Freq: Once | ORAL | Status: AC
  Administered 2020-04-01: 650 mg via ORAL

## 2020-04-01 MED ORDER — ACETAMINOPHEN 325 MG PO TABS
ORAL_TABLET | ORAL | Status: AC
  Filled 2020-04-01: qty 2

## 2020-04-01 NOTE — Patient Instructions (Signed)

## 2020-04-05 DIAGNOSIS — R269 Unspecified abnormalities of gait and mobility: Secondary | ICD-10-CM | POA: Diagnosis not present

## 2020-04-05 DIAGNOSIS — E118 Type 2 diabetes mellitus with unspecified complications: Secondary | ICD-10-CM | POA: Diagnosis not present

## 2020-04-05 DIAGNOSIS — M6259 Muscle wasting and atrophy, not elsewhere classified, multiple sites: Secondary | ICD-10-CM | POA: Diagnosis not present

## 2020-04-05 DIAGNOSIS — M25551 Pain in right hip: Secondary | ICD-10-CM | POA: Diagnosis not present

## 2020-04-05 DIAGNOSIS — M25472 Effusion, left ankle: Secondary | ICD-10-CM | POA: Diagnosis not present

## 2020-04-05 DIAGNOSIS — M25672 Stiffness of left ankle, not elsewhere classified: Secondary | ICD-10-CM | POA: Diagnosis not present

## 2020-04-05 DIAGNOSIS — M25572 Pain in left ankle and joints of left foot: Secondary | ICD-10-CM | POA: Diagnosis not present

## 2020-04-07 DIAGNOSIS — M25672 Stiffness of left ankle, not elsewhere classified: Secondary | ICD-10-CM | POA: Diagnosis not present

## 2020-04-07 DIAGNOSIS — M25472 Effusion, left ankle: Secondary | ICD-10-CM | POA: Diagnosis not present

## 2020-04-07 DIAGNOSIS — M6259 Muscle wasting and atrophy, not elsewhere classified, multiple sites: Secondary | ICD-10-CM | POA: Diagnosis not present

## 2020-04-07 DIAGNOSIS — M25551 Pain in right hip: Secondary | ICD-10-CM | POA: Diagnosis not present

## 2020-04-07 DIAGNOSIS — M25572 Pain in left ankle and joints of left foot: Secondary | ICD-10-CM | POA: Diagnosis not present

## 2020-04-07 DIAGNOSIS — R269 Unspecified abnormalities of gait and mobility: Secondary | ICD-10-CM | POA: Diagnosis not present

## 2020-04-07 DIAGNOSIS — E118 Type 2 diabetes mellitus with unspecified complications: Secondary | ICD-10-CM | POA: Diagnosis not present

## 2020-04-08 ENCOUNTER — Other Ambulatory Visit: Payer: Self-pay

## 2020-04-08 ENCOUNTER — Inpatient Hospital Stay: Payer: Medicare Other

## 2020-04-08 VITALS — BP 96/56 | HR 64 | Temp 97.6°F | Resp 16

## 2020-04-08 DIAGNOSIS — Z7982 Long term (current) use of aspirin: Secondary | ICD-10-CM | POA: Diagnosis not present

## 2020-04-08 DIAGNOSIS — D509 Iron deficiency anemia, unspecified: Secondary | ICD-10-CM | POA: Diagnosis not present

## 2020-04-08 DIAGNOSIS — K219 Gastro-esophageal reflux disease without esophagitis: Secondary | ICD-10-CM | POA: Diagnosis not present

## 2020-04-08 DIAGNOSIS — Z85828 Personal history of other malignant neoplasm of skin: Secondary | ICD-10-CM | POA: Diagnosis not present

## 2020-04-08 DIAGNOSIS — D508 Other iron deficiency anemias: Secondary | ICD-10-CM

## 2020-04-08 DIAGNOSIS — E119 Type 2 diabetes mellitus without complications: Secondary | ICD-10-CM | POA: Diagnosis not present

## 2020-04-08 DIAGNOSIS — I10 Essential (primary) hypertension: Secondary | ICD-10-CM | POA: Diagnosis not present

## 2020-04-08 MED ORDER — DIPHENHYDRAMINE HCL 25 MG PO CAPS
ORAL_CAPSULE | ORAL | Status: AC
  Filled 2020-04-08: qty 2

## 2020-04-08 MED ORDER — ACETAMINOPHEN 325 MG PO TABS
ORAL_TABLET | ORAL | Status: AC
  Filled 2020-04-08: qty 2

## 2020-04-08 MED ORDER — ACETAMINOPHEN 325 MG PO TABS
650.0000 mg | ORAL_TABLET | Freq: Once | ORAL | Status: AC
  Administered 2020-04-08: 650 mg via ORAL

## 2020-04-08 MED ORDER — SODIUM CHLORIDE 0.9 % IV SOLN
200.0000 mg | Freq: Once | INTRAVENOUS | Status: AC
  Administered 2020-04-08: 200 mg via INTRAVENOUS
  Filled 2020-04-08: qty 200

## 2020-04-08 MED ORDER — DIPHENHYDRAMINE HCL 25 MG PO CAPS
50.0000 mg | ORAL_CAPSULE | Freq: Once | ORAL | Status: AC
  Administered 2020-04-08: 50 mg via ORAL

## 2020-04-08 MED ORDER — SODIUM CHLORIDE 0.9 % IV SOLN
Freq: Once | INTRAVENOUS | Status: AC
  Filled 2020-04-08: qty 250

## 2020-04-08 NOTE — Patient Instructions (Signed)

## 2020-04-13 DIAGNOSIS — M25672 Stiffness of left ankle, not elsewhere classified: Secondary | ICD-10-CM | POA: Diagnosis not present

## 2020-04-13 DIAGNOSIS — M25551 Pain in right hip: Secondary | ICD-10-CM | POA: Diagnosis not present

## 2020-04-13 DIAGNOSIS — M25472 Effusion, left ankle: Secondary | ICD-10-CM | POA: Diagnosis not present

## 2020-04-13 DIAGNOSIS — M6259 Muscle wasting and atrophy, not elsewhere classified, multiple sites: Secondary | ICD-10-CM | POA: Diagnosis not present

## 2020-04-13 DIAGNOSIS — M25572 Pain in left ankle and joints of left foot: Secondary | ICD-10-CM | POA: Diagnosis not present

## 2020-04-13 DIAGNOSIS — E118 Type 2 diabetes mellitus with unspecified complications: Secondary | ICD-10-CM | POA: Diagnosis not present

## 2020-04-13 DIAGNOSIS — R269 Unspecified abnormalities of gait and mobility: Secondary | ICD-10-CM | POA: Diagnosis not present

## 2020-04-15 DIAGNOSIS — D72819 Decreased white blood cell count, unspecified: Secondary | ICD-10-CM | POA: Diagnosis not present

## 2020-04-15 DIAGNOSIS — D696 Thrombocytopenia, unspecified: Secondary | ICD-10-CM | POA: Diagnosis not present

## 2020-04-15 DIAGNOSIS — D649 Anemia, unspecified: Secondary | ICD-10-CM | POA: Diagnosis not present

## 2020-04-20 DIAGNOSIS — M25572 Pain in left ankle and joints of left foot: Secondary | ICD-10-CM | POA: Diagnosis not present

## 2020-04-20 DIAGNOSIS — E118 Type 2 diabetes mellitus with unspecified complications: Secondary | ICD-10-CM | POA: Diagnosis not present

## 2020-04-20 DIAGNOSIS — M6259 Muscle wasting and atrophy, not elsewhere classified, multiple sites: Secondary | ICD-10-CM | POA: Diagnosis not present

## 2020-04-20 DIAGNOSIS — M25551 Pain in right hip: Secondary | ICD-10-CM | POA: Diagnosis not present

## 2020-04-20 DIAGNOSIS — R269 Unspecified abnormalities of gait and mobility: Secondary | ICD-10-CM | POA: Diagnosis not present

## 2020-04-20 DIAGNOSIS — M25672 Stiffness of left ankle, not elsewhere classified: Secondary | ICD-10-CM | POA: Diagnosis not present

## 2020-04-20 DIAGNOSIS — M25472 Effusion, left ankle: Secondary | ICD-10-CM | POA: Diagnosis not present

## 2020-04-22 DIAGNOSIS — M6259 Muscle wasting and atrophy, not elsewhere classified, multiple sites: Secondary | ICD-10-CM | POA: Diagnosis not present

## 2020-04-22 DIAGNOSIS — M25672 Stiffness of left ankle, not elsewhere classified: Secondary | ICD-10-CM | POA: Diagnosis not present

## 2020-04-22 DIAGNOSIS — M25572 Pain in left ankle and joints of left foot: Secondary | ICD-10-CM | POA: Diagnosis not present

## 2020-04-22 DIAGNOSIS — R269 Unspecified abnormalities of gait and mobility: Secondary | ICD-10-CM | POA: Diagnosis not present

## 2020-04-22 DIAGNOSIS — M25551 Pain in right hip: Secondary | ICD-10-CM | POA: Diagnosis not present

## 2020-04-22 DIAGNOSIS — E118 Type 2 diabetes mellitus with unspecified complications: Secondary | ICD-10-CM | POA: Diagnosis not present

## 2020-04-22 DIAGNOSIS — M25472 Effusion, left ankle: Secondary | ICD-10-CM | POA: Diagnosis not present

## 2020-05-02 ENCOUNTER — Telehealth: Payer: Self-pay | Admitting: Medical Oncology

## 2020-05-02 ENCOUNTER — Other Ambulatory Visit: Payer: Self-pay | Admitting: Physician Assistant

## 2020-05-02 ENCOUNTER — Telehealth: Payer: Self-pay | Admitting: Internal Medicine

## 2020-05-02 DIAGNOSIS — D508 Other iron deficiency anemias: Secondary | ICD-10-CM

## 2020-05-02 NOTE — Telephone Encounter (Signed)
Scheduled f/u appt per sch msg. Called and spoke with patient. Confirmed appt

## 2020-05-02 NOTE — Telephone Encounter (Signed)
Pt called for iron infusion. She said she needs 5 more iron treatments Dr Gaylyn Cheers told her she needed a total of 1750g of iron. She completed 4 tx ( 200 g each). She said Dr Gaylyn Cheers  needs  5 more iron tx.  Per Julien Nordmann she needs iron studies repeated.

## 2020-05-04 ENCOUNTER — Ambulatory Visit (INDEPENDENT_AMBULATORY_CARE_PROVIDER_SITE_OTHER): Payer: Medicare Other

## 2020-05-04 ENCOUNTER — Encounter: Payer: Self-pay | Admitting: Orthopaedic Surgery

## 2020-05-04 ENCOUNTER — Ambulatory Visit (INDEPENDENT_AMBULATORY_CARE_PROVIDER_SITE_OTHER): Payer: Medicare Other | Admitting: Orthopaedic Surgery

## 2020-05-04 ENCOUNTER — Ambulatory Visit: Payer: Self-pay

## 2020-05-04 ENCOUNTER — Other Ambulatory Visit: Payer: Self-pay

## 2020-05-04 ENCOUNTER — Ambulatory Visit (INDEPENDENT_AMBULATORY_CARE_PROVIDER_SITE_OTHER): Payer: Medicare Other | Admitting: Endocrinology

## 2020-05-04 ENCOUNTER — Encounter: Payer: Self-pay | Admitting: Endocrinology

## 2020-05-04 VITALS — BP 120/74 | HR 80 | Ht 71.0 in | Wt 170.0 lb

## 2020-05-04 DIAGNOSIS — I2510 Atherosclerotic heart disease of native coronary artery without angina pectoris: Secondary | ICD-10-CM

## 2020-05-04 DIAGNOSIS — E1140 Type 2 diabetes mellitus with diabetic neuropathy, unspecified: Secondary | ICD-10-CM

## 2020-05-04 DIAGNOSIS — M25551 Pain in right hip: Secondary | ICD-10-CM

## 2020-05-04 LAB — POCT GLYCOSYLATED HEMOGLOBIN (HGB A1C): Hemoglobin A1C: 6.7 % — AB (ref 4.0–5.6)

## 2020-05-04 MED ORDER — METFORMIN HCL ER 500 MG PO TB24
2000.0000 mg | ORAL_TABLET | Freq: Every day | ORAL | 3 refills | Status: DC
Start: 2020-05-04 — End: 2021-03-02

## 2020-05-04 MED ORDER — OZEMPIC (1 MG/DOSE) 2 MG/1.5ML ~~LOC~~ SOPN: 1.0000 mg | PEN_INJECTOR | SUBCUTANEOUS | 3 refills | Status: DC

## 2020-05-04 NOTE — Progress Notes (Signed)
Subjective:    Patient ID: Savannah [name], female    DOB: September 17, 1956, 64 y.o.   MRN: [id number]  HPI Pt returns for f/u of diabetes mellitus:  DM type: 2 Dx'ed: 6948 Complications: painful PN, and CAD (seen on CT).   Therapy: trulicity and 3 oral meds.   GDM: never DKA: never Severe hypoglycemia: never.   Pancreatitis: never.  Other: she took insulin from 2004-2011, when she had gastric bypass surgery.  She is retired.   Interval history: She says cbg's are well-controlled.  She seldom has hypoglycemia, and these episodes are mild.  This happens when she takes repaglinide at HS.  Ins now prefers Ozempic.  Past Medical History:  Diagnosis Date  . ABDOMINAL PAIN, CHRONIC 10/20/2009  . Acute cystitis 08/17/2009  . ALLERGIC RHINITIS 11/21/2007  . Allergy   . ASYMPTOMATIC POSTMENOPAUSAL STATUS 09/29/2008  . Bariatric surgery status 07/07/2010  . Bariatric surgery status 07/07/2010   Qualifier: Diagnosis of  By: Loanne Drilling MD, Jacelyn Pi   . Cancer ([place])    uterine  . Cataract   . Coronary artery disease   . DEPRESSION 11/21/2007  . DIABETES MELLITUS, TYPE II 07/08/2007  . DYSPNEA 05/20/2009  . Edema 05/20/2009  . Eosinophilic esophagitis 03/15/6269  . FEVER UNSPECIFIED 10/20/2009  . FEVER, HX OF 03/09/2010  . GERD (gastroesophageal reflux disease)   . Headache(784.0) 02/06/2010  . Hepatomegaly 01/06/2008  . HYPERLIPIDEMIA 07/08/2007  . HYPERTENSION 07/08/2007  . LEUKOPENIA, MILD 09/29/2008  . OBESITY 01/06/2008  . OSTEOARTHRITIS 01/06/2008  . Other chronic nonalcoholic liver disease 3/50/0938  . PERIPHERAL NEUROPATHY 11/21/2007  . Peripheral neuropathy   . Postsurgical hypothyroidism 09/19/2010  . SINUSITIS- ACUTE-NOS 11/21/2007  . TB SKIN TEST, POSITIVE 02/06/2010  . THYROID NODULE 03/09/2010    Past Surgical History:  Procedure Laterality Date  . ABDOMINAL HYSTERECTOMY    . BASAL CELL CARCINOMA EXCISION    . CARDIAC CATHETERIZATION    . COLONOSCOPY    . EYE SURGERY Bilateral 08/2018, 09/2018  .  FOOT SURGERY Left    10/2019  . KNEE ARTHROSCOPY Left   . LEFT HEART CATH AND CORONARY ANGIOGRAPHY N/A 01/20/2019   Procedure: LEFT HEART CATH AND CORONARY ANGIOGRAPHY;  Surgeon: Belva Crome, MD;  Location: [facility];  Service: Cardiovascular;  Laterality: N/A;  . LYMPH NODE DISSECTION    . OOPHORECTOMY    . Right total knee replacement    . ROUX-EN-Y GASTRIC BYPASS  2011  . SKIN TAG REMOVAL    . THYROIDECTOMY    . TONSILLECTOMY AND ADENOIDECTOMY    . UPPER GASTROINTESTINAL ENDOSCOPY      Social History   Socioeconomic History  . Marital status: Single    Spouse name: Not on file  . Number of children: 0  . Years of education: 16  . Highest education level: Bachelor's degree (e.g., BA, AB, BS)  Occupational History  . Occupation: Optometrist  Tobacco Use  . Smoking status: Former Smoker    Packs/day: 1.50    Years: 15.00    Pack years: 22.50    Types: Cigarettes  . Smokeless tobacco: Never Used  Vaping Use  . Vaping Use: Never used  Substance and Sexual Activity  . Alcohol use: Yes    Alcohol/week: 0.0 standard drinks    Comment: rarely  . Drug use: Never  . Sexual activity: Not on file  Other Topics Concern  . Not on file  Social History Narrative  . Not on file  Social Determinants of Health   Financial Resource Strain: Low Risk   . Difficulty of Paying Living Expenses: Not hard at all  Food Insecurity: No Food Insecurity  . Worried About Charity fundraiser in the Last Year: Never true  . Ran Out of Food in the Last Year: Never true  Transportation Needs: No Transportation Needs  . Lack of Transportation (Medical): No  . Lack of Transportation (Non-Medical): No  Physical Activity: Sufficiently Active  . Days of Exercise per Week: 5 days  . Minutes of Exercise per Session: 30 min  Stress: No Stress Concern Present  . Feeling of Stress : Not at all  Social Connections:   . Frequency of Communication with Friends and Family:   . Frequency of  Social Gatherings with Friends and Family:   . Attends Religious Services:   . Active Member of Clubs or Organizations:   . Attends Archivist Meetings:   [name] Marital Status:   Intimate Partner Violence:   . Fear of Current or Ex-Partner:   . Emotionally Abused:   [name] Physically Abused:   . Sexually Abused:     Current Outpatient Medications on File Prior to Visit  Medication Sig Dispense Refill  . amoxicillin (AMOXIL) 500 MG capsule TAKE 4 CAPSULES BY MOUTH ONE HOUR BEFORE DENTAL APPT.    [name] aspirin EC 81 MG tablet Take 1 tablet (81 mg total) by mouth daily. Starting 01/18/19 prior to cardiac cath    . Biotin-D POWD Take 300 mg by mouth daily.     . canagliflozin (INVOKANA) 300 MG TABS tablet Take 1 tablet (300 mg total) by mouth daily before breakfast. 90 tablet 3  . Cholecalciferol (VITAMIN D3) 125 MCG (5000 UT) CAPS Take 5,000 Units by mouth daily.    [name] desloratadine (CLARINEX) 5 MG tablet Take 5 mg by mouth daily.    . diclofenac sodium (VOLTAREN) 1 % GEL Apply 4 g topically 4 (four) times daily. (Patient taking differently: Apply 4 g topically as needed (pain). ) 100 g 11  . docusate sodium (COLACE) 100 MG capsule Take 200 mg by mouth 2 (two) times daily.    [name] gabapentin (NEURONTIN) 300 MG capsule Take 3 capsules (900 mg total) by mouth 3 (three) times daily. 360 capsule 2  . halobetasol (ULTRAVATE) 0.05 % cream Apply 1 application topically 2 (two) times daily as needed (lichen sclerosus).   2  . HYDROcodone-acetaminophen (NORCO/VICODIN) 5-325 MG tablet Take 1 tablet by mouth daily as needed for moderate pain or severe pain.    [name] levothyroxine (SYNTHROID) 112 MCG tablet TAKE 1 TABLET BY MOUTH EVERY DAY (Patient taking differently: Take 112 mcg by mouth daily before breakfast. ) 90 tablet 3  . MAGNESIUM PO Take 1 tablet by mouth daily as needed (cramps).     [name] MITIGARE 0.6 MG CAPS TAKE 1 CAPSULE BY MOUTH EVERY DAY 90 capsule 0  . Multiple Vitamin (MULTIVITAMIN) tablet Take 2  tablets by mouth daily.     . nitroGLYCERIN (NITROSTAT) 0.4 MG SL tablet Place 1 tablet (0.4 mg total) under the tongue every 5 (five) minutes as needed. 25 tablet 3  . ONETOUCH ULTRA test strip USE TO MONITOR GLUCOSE LEVELS ONCE PER DAY E11.40 100 strip 2  . oxyCODONE-acetaminophen (PERCOCET) 5-325 MG tablet Take 1 tablet by mouth every 6 (six) hours as needed for severe pain. 30 tablet 0  . phenazopyridine (PYRIDIUM) 100 MG tablet Take 100 mg by mouth 3 (three) times daily as  needed for pain.    . Probiotic CAPS Take 1 capsule by mouth daily.    . prochlorperazine (COMPAZINE) 10 MG tablet Take 1 tablet (10 mg total) by mouth every 6 (six) hours as needed (migraine). Take with imitrex and benadryl 30 tablet 0  . repaglinide (PRANDIN) 2 MG tablet Take 1 tablet (2 mg total) by mouth 2 (two) times daily before a meal. 180 tablet 3  . SUMAtriptan (IMITREX) 25 MG tablet TAKE 1 TABLET BY MOUTH EVERY 2 HOURS AS NEEDED FOR MIGRAINE.TAKE WITH BENADRYL AND COMPAZINE 90 tablet 0  . traMADol (ULTRAM) 50 MG tablet Take 50 mg by mouth 2 (two) times daily as needed for moderate pain.    . TURMERIC CURCUMIN PO Take 1 capsule by mouth daily.    [name] atorvastatin (LIPITOR) 40 MG tablet Take 1 tablet (40 mg total) by mouth daily. 90 tablet 1  . furosemide (LASIX) 20 MG tablet Take 1 tablet (20 mg total) by mouth daily. (Patient taking differently: Take 20 mg by mouth daily as needed for fluid. ) 90 tablet 3   No current facility-administered medications on file prior to visit.    Allergies  Allergen Reactions  . Ace Inhibitors Cough  . Caffeine     PVCs  . Ciprofloxacin Itching  . Morphine And Related Itching    Pt tolerates medication if given with diphenhydramine  . Mucinex [Guaifenesin Er]     PVCs  . Nsaids     Gastric Bypass Surgery - unable to take, tolerates ec 81 aspirin   . Other     Antihistamine-alkylamine - PVCs tolerates benadryl     Family History  Problem Relation Age of Onset  .  Multiple sclerosis Sister   . Breast cancer Sister        breast onset age 62  . Osteoporosis Sister   . Diabetes Mother   . Diabetes Father   . Congestive Heart Failure Father   . Hypertension Father   . Myelodysplastic syndrome Father   . Liver cancer Maternal Aunt   . Breast cancer Maternal Aunt   . Diabetes Maternal Aunt   . Colon cancer Maternal Uncle   . Diabetes Maternal Uncle   . Diabetes Maternal Grandmother   . Diabetes Paternal Grandmother     BP 120/74   Pulse 80   Ht 5' 11"  (1.803 m)   Wt 170 lb (77.1 kg)   SpO2 98%   BMI 23.71 kg/m    Review of Systems Denies n/v/LOC.     Objective:   Physical Exam VITAL SIGNS:  See vs page GENERAL: no distress Pulses: dorsalis pedis intact bilat.   MSK: no deformity of the feet.  CV: 2+ left and 1+ right leg edema.  Skin:  no ulcer on the feet.  normal color and temp on the feet.  Neuro: sensation is intact to touch on the feet, but decreased from normal.    Lab Results  Component Value Date   HGBA1C 6.7 (A) 05/04/2020   Lab Results  Component Value Date   TSH 0.92 01/14/2020   Lab Results  Component Value Date   CREATININE 1.06 (H) 02/29/2020   BUN 12 02/29/2020   NA 139 02/29/2020   K 4.1 02/29/2020   CL 111 02/29/2020   CO2 21 (L) 02/29/2020        Assessment & Plan:  Type 2 DM, with CAD: She declines to reduce repaglinide. She says she'll take at meals only Hypoglycemia, due to  repaglinide: this limits aggressiveness of glycemic control  Patient Instructions  I have sent a prescription to your pharmacy, to change the Trulicity to Ozempic, and: Please continue the same other diabetes medications.  check your blood sugar once a day.  vary the time of day when you check, between before the 3 meals, and at bedtime.  also check if you have symptoms of your blood sugar being too high or too low.  please keep a record of the readings and bring it to your next appointment here (or you can bring the meter  itself).  You can write it on any piece of paper.  please call us sooner if your blood sugar goes below 70, or if you have a lot of readings over 200. Please come back for a follow-up appointment in 4-6 months.

## 2020-05-04 NOTE — Progress Notes (Signed)
Subjective: Patient is here for ultrasound-guided intra-articular right hip injection.   Pain in the anterior lateral hip for the past several months.  She has a leg length discrepancy with the right leg longer than the left.  She has a really degenerative changes in her hip.  Objective: Pain with passive internal rotation, also tender over the greater trochanter.  Procedure: Ultrasound-guided right hip injection: After sterile prep with Betadine, injected 8 cc 1% lidocaine without epinephrine and 40 mg methylprednisolone using a 22-gauge spinal needle, passing the needle through the iliofemoral ligament into the femoral head/neck junction.  Injectate seen filling the joint capsule.  She had good immediate relief.  If this does not eliminate her pain, we will try a greater trochanter injection.

## 2020-05-04 NOTE — Patient Instructions (Addendum)
I have sent a prescription to your pharmacy, to change the Trulicity to Ozempic, and: Please continue the same other diabetes medications.  check your blood sugar once a day.  vary the time of day when you check, between before the 3 meals, and at bedtime.  also check if you have symptoms of your blood sugar being too high or too low.  please keep a record of the readings and bring it to your next appointment here (or you can bring the meter itself).  You can write it on any piece of paper.  please call us sooner if your blood sugar goes below 70, or if you have a lot of readings over 200. Please come back for a follow-up appointment in 4-6 months.

## 2020-05-04 NOTE — Progress Notes (Signed)
Office Visit Note   Patient: Savannah [name]           Date of Birth: 07/22/1956           MRN: [id number] Visit Date: 05/04/2020              Requested by: Vivi Barrack, MD [address] [place],  [place] [address] PCP: Vivi Barrack, MD   Assessment & Plan: Visit Diagnoses:  1. Right hip pain     Plan: Impression is moderate right hip DJD.  Certainly the leg length discrepancy exacerbates her underlying degenerative disease.  She was receptive to cortisone injection today as well as evaluation by biotech for heel versus shoe lift.  I think the leg length discrepancy has also been exacerbated by the fact that her plantar fasciectomy surgery was complicated by calcaneal fracture which led to loss of her normal calcaneal height and anatomy.  Follow-Up Instructions: Return if symptoms worsen or fail to improve.   Orders:  Orders Placed This Encounter  Procedures   XR HIP UNILAT W OR W/O PELVIS 2-3 VIEWS RIGHT   US Guided Needle Placement - No Linked Charges   No orders of the defined types were placed in this encounter.     Procedures: No procedures performed   Clinical Data: No additional findings.   Subjective: Chief Complaint  Patient presents with   Right Hip - Pain    Savannah [name] is a 64 year old female who is a referral for evaluation of chronic right hip and groin pain.  She has slight leg length discrepancy with the right leg is longer.  She has pain that is worse with ambulation and she has a slight limp.  The leg length discrepancy makes the pain worse as well.   Review of Systems  Constitutional: Negative.   HENT: Negative.   Eyes: Negative.   Respiratory: Negative.   Cardiovascular: Negative.   Endocrine: Negative.   Musculoskeletal: Negative.   Neurological: Negative.   Hematological: Negative.   Psychiatric/Behavioral: Negative.   All other systems reviewed and are negative.    Objective: Vital Signs: There were no vitals taken for this  visit.  Physical Exam Vitals and nursing note reviewed.  Constitutional:      Appearance: She is well-developed.  HENT:     Head: Normocephalic and atraumatic.  Pulmonary:     Effort: Pulmonary effort is normal.  Abdominal:     Palpations: Abdomen is soft.  Musculoskeletal:     Cervical back: Neck supple.  Skin:    General: Skin is warm.     Capillary Refill: Capillary refill takes less than 2 seconds.  Neurological:     Mental Status: She is alert and oriented to person, place, and time.  Psychiatric:        Behavior: Behavior normal.        Thought Content: Thought content normal.        Judgment: Judgment normal.     Ortho Exam Right lower extremity shows a 1inch leg length discrepancy.  She has pain with logroll and internal rotation.  Positive Stinchfield sign.  She has 2+ pitting edema. Specialty Comments:  No specialty comments available.  Imaging: XR HIP UNILAT W OR W/O PELVIS 2-3 VIEWS RIGHT  Result Date: 05/04/2020 Moderate right hip DJD with subchondral cysts on both the femoral head and acetabulum.    PMFS History: Patient Active Problem List   Diagnosis Date Noted   Iron deficiency anemia 03/14/2020   Neutropenia (  [place]) 02/29/2020   Deficiency anemia 02/29/2020   Breast lump 09/02/2019   Lesion of vulva 09/02/2019   Menorrhagia 09/02/2019   Neuropathy 09/02/2019   Abdominal pain, epigastric 06/25/2019   Irregular bowel habits 06/25/2019   Coronary artery disease involving native coronary artery of native heart without angina pectoris    Temporomandibular joint disorder 12/05/2018   Fracture of upper limb 12/05/2018   Fatigue 12/05/2018   Inverted nipple 12/05/2018   Senile purpura ([place]) 11/21/2018   GERD with esophagitis 11/21/2018   Pseudogout involving multiple joints 11/21/2018   Lipoma 17/51/0258   Lichen sclerosus 52/77/8242   H/O gastric bypass 11/16/2016   GERD (gastroesophageal reflux disease) 11/16/2016    Depression 11/16/2016   Diabetic polyneuropathy associated with type 2 diabetes mellitus ([place]) 11/16/2016   Hyperlipidemia 11/16/2016   Left lower quadrant pain 11/16/2016   Uterine cancer ([place]) s/p hysterectomy 1997 01/04/2012   Postsurgical hypothyroidism 09/19/2010   Generalized abdominal pain 10/20/2009   Diabetic neuropathy ([place]) 11/21/2007   Allergic rhinitis 11/21/2007   Type 2 diabetes mellitus with diabetic neuropathy, unspecified ([place]) 07/08/2007   Dyslipidemia associated with type 2 diabetes mellitus ([place]) 07/08/2007   Past Medical History:  Diagnosis Date   ABDOMINAL PAIN, CHRONIC 10/20/2009   Acute cystitis 08/17/2009   ALLERGIC RHINITIS 11/21/2007   Allergy    ASYMPTOMATIC POSTMENOPAUSAL STATUS 09/29/2008   Bariatric surgery status 07/07/2010   Bariatric surgery status 07/07/2010   Qualifier: Diagnosis of  By: Loanne Drilling MD, Sean A    Cancer [facility])    uterine   Cataract    Coronary artery disease    DEPRESSION 11/21/2007   DIABETES MELLITUS, TYPE II 07/08/2007   DYSPNEA 05/20/2009   Edema 01/14/3613   Eosinophilic esophagitis 02/13/1539   FEVER UNSPECIFIED 10/20/2009   FEVER, HX OF 03/09/2010   GERD (gastroesophageal reflux disease)    Headache(784.0) 02/06/2010   Hepatomegaly 01/06/2008   HYPERLIPIDEMIA 07/08/2007   HYPERTENSION 07/08/2007   LEUKOPENIA, MILD 09/29/2008   OBESITY 01/06/2008   OSTEOARTHRITIS 01/06/2008   Other chronic nonalcoholic liver disease 0/86/7619   PERIPHERAL NEUROPATHY 11/21/2007   Peripheral neuropathy    Postsurgical hypothyroidism 09/19/2010   SINUSITIS- ACUTE-NOS 11/21/2007   TB SKIN TEST, POSITIVE 02/06/2010   THYROID NODULE 03/09/2010    Family History  Problem Relation Age of Onset   Multiple sclerosis Sister    Breast cancer Sister        breast onset age 30   Osteoporosis Sister    Diabetes Mother    Diabetes Father    Congestive Heart Failure Father    Hypertension Father    Myelodysplastic  syndrome Father    Liver cancer Maternal Aunt    Breast cancer Maternal Aunt    Diabetes Maternal Aunt    Colon cancer Maternal Uncle    Diabetes Maternal Uncle    Diabetes Maternal Grandmother    Diabetes Paternal Grandmother     Past Surgical History:  Procedure Laterality Date   ABDOMINAL HYSTERECTOMY     BASAL CELL CARCINOMA EXCISION     CARDIAC CATHETERIZATION     COLONOSCOPY     EYE SURGERY Bilateral 08/2018, 09/2018   FOOT SURGERY Left    10/2019   KNEE ARTHROSCOPY Left    LEFT HEART CATH AND CORONARY ANGIOGRAPHY N/A 01/20/2019   Procedure: LEFT HEART CATH AND CORONARY ANGIOGRAPHY;  Surgeon: Belva Crome, MD;  Location: [facility];  Service: Cardiovascular;  Laterality: N/A;   LYMPH NODE DISSECTION  OOPHORECTOMY     Right total knee replacement     ROUX-EN-Y GASTRIC BYPASS  2011   SKIN TAG REMOVAL     THYROIDECTOMY     TONSILLECTOMY AND ADENOIDECTOMY     UPPER GASTROINTESTINAL ENDOSCOPY     Social History   Occupational History   Occupation: Optometrist  Tobacco Use   Smoking status: Former Smoker    Packs/day: 1.50    Years: 15.00    Pack years: 22.50    Types: Cigarettes   Smokeless tobacco: Never Used  Scientific laboratory technician Use: Never used  Substance and Sexual Activity   Alcohol use: Yes    Alcohol/week: 0.0 standard drinks    Comment: rarely   Drug use: Never   Sexual activity: Not on file

## 2020-05-05 ENCOUNTER — Other Ambulatory Visit: Payer: Self-pay | Admitting: Gastroenterology

## 2020-05-10 ENCOUNTER — Inpatient Hospital Stay: Payer: Medicare Other | Attending: Physician Assistant | Admitting: Internal Medicine

## 2020-05-10 ENCOUNTER — Other Ambulatory Visit: Payer: Self-pay

## 2020-05-10 ENCOUNTER — Encounter: Payer: Self-pay | Admitting: Internal Medicine

## 2020-05-10 ENCOUNTER — Inpatient Hospital Stay: Payer: Medicare Other

## 2020-05-10 VITALS — BP 107/57 | HR 73 | Temp 97.7°F | Resp 18 | Ht 71.0 in | Wt 168.7 lb

## 2020-05-10 DIAGNOSIS — I2510 Atherosclerotic heart disease of native coronary artery without angina pectoris: Secondary | ICD-10-CM | POA: Insufficient documentation

## 2020-05-10 DIAGNOSIS — Z7984 Long term (current) use of oral hypoglycemic drugs: Secondary | ICD-10-CM | POA: Insufficient documentation

## 2020-05-10 DIAGNOSIS — Z791 Long term (current) use of non-steroidal anti-inflammatories (NSAID): Secondary | ICD-10-CM | POA: Insufficient documentation

## 2020-05-10 DIAGNOSIS — K219 Gastro-esophageal reflux disease without esophagitis: Secondary | ICD-10-CM | POA: Insufficient documentation

## 2020-05-10 DIAGNOSIS — E119 Type 2 diabetes mellitus without complications: Secondary | ICD-10-CM | POA: Diagnosis not present

## 2020-05-10 DIAGNOSIS — E785 Hyperlipidemia, unspecified: Secondary | ICD-10-CM | POA: Insufficient documentation

## 2020-05-10 DIAGNOSIS — Z85828 Personal history of other malignant neoplasm of skin: Secondary | ICD-10-CM | POA: Insufficient documentation

## 2020-05-10 DIAGNOSIS — Z7982 Long term (current) use of aspirin: Secondary | ICD-10-CM | POA: Diagnosis not present

## 2020-05-10 DIAGNOSIS — N920 Excessive and frequent menstruation with regular cycle: Secondary | ICD-10-CM | POA: Insufficient documentation

## 2020-05-10 DIAGNOSIS — I10 Essential (primary) hypertension: Secondary | ICD-10-CM | POA: Insufficient documentation

## 2020-05-10 DIAGNOSIS — Z79899 Other long term (current) drug therapy: Secondary | ICD-10-CM | POA: Diagnosis not present

## 2020-05-10 DIAGNOSIS — D539 Nutritional anemia, unspecified: Secondary | ICD-10-CM | POA: Diagnosis not present

## 2020-05-10 DIAGNOSIS — Z90721 Acquired absence of ovaries, unilateral: Secondary | ICD-10-CM | POA: Diagnosis not present

## 2020-05-10 DIAGNOSIS — Z9884 Bariatric surgery status: Secondary | ICD-10-CM | POA: Diagnosis not present

## 2020-05-10 DIAGNOSIS — Z90710 Acquired absence of both cervix and uterus: Secondary | ICD-10-CM | POA: Diagnosis not present

## 2020-05-10 DIAGNOSIS — E669 Obesity, unspecified: Secondary | ICD-10-CM | POA: Insufficient documentation

## 2020-05-10 DIAGNOSIS — D508 Other iron deficiency anemias: Secondary | ICD-10-CM

## 2020-05-10 DIAGNOSIS — D500 Iron deficiency anemia secondary to blood loss (chronic): Secondary | ICD-10-CM | POA: Insufficient documentation

## 2020-05-10 LAB — CBC WITH DIFFERENTIAL (CANCER CENTER ONLY)
Abs Immature Granulocytes: 0.01 10*3/uL (ref 0.00–0.07)
Basophils Absolute: 0 10*3/uL (ref 0.0–0.1)
Basophils Relative: 1 %
Eosinophils Absolute: 0.2 10*3/uL (ref 0.0–0.5)
Eosinophils Relative: 5 %
HCT: 38.9 % (ref 36.0–46.0)
Hemoglobin: 12.3 g/dL (ref 12.0–15.0)
Immature Granulocytes: 0 %
Lymphocytes Relative: 37 %
Lymphs Abs: 1.4 10*3/uL (ref 0.7–4.0)
MCH: 32.5 pg (ref 26.0–34.0)
MCHC: 31.6 g/dL (ref 30.0–36.0)
MCV: 102.9 fL — ABNORMAL HIGH (ref 80.0–100.0)
Monocytes Absolute: 0.3 10*3/uL (ref 0.1–1.0)
Monocytes Relative: 8 %
Neutro Abs: 1.8 10*3/uL (ref 1.7–7.7)
Neutrophils Relative %: 49 %
Platelet Count: 190 10*3/uL (ref 150–400)
RBC: 3.78 MIL/uL — ABNORMAL LOW (ref 3.87–5.11)
RDW: 14.7 % (ref 11.5–15.5)
WBC Count: 3.7 10*3/uL — ABNORMAL LOW (ref 4.0–10.5)
nRBC: 0 % (ref 0.0–0.2)

## 2020-05-10 LAB — IRON AND TIBC
Iron: 99 ug/dL (ref 41–142)
Saturation Ratios: 33 % (ref 21–57)
TIBC: 298 ug/dL (ref 236–444)
UIBC: 199 ug/dL (ref 120–384)

## 2020-05-10 LAB — FERRITIN: Ferritin: 63 ng/mL (ref 11–307)

## 2020-05-10 NOTE — Progress Notes (Signed)
[facility] [phone]   [phone]  OFFICE PROGRESS NOTE  [place], [place] [address]  DIAGNOSIS: Anemia and leukocytopenia and occasional thrombocytopenia.  PRIOR THERAPY: None  CURRENT THERAPY:  Venofer infusion weekly status post four doses.  INTERVAL HISTORY: Savannah [name] 64 y.o. female returns to the clinic today for follow-up visit.  The patient is feeling fine today except for persistent fatigue.  She tolerated the previous 4 weeks of Venofer fairly well.  She was seen at [facility] by Dr. Danton Clap De-Ling for evaluation of the upper extremity bruises and this was diagnosed as senile purpura.  She was given advised about avoiding sun exposure as well as applying topical cream to these areas.  The patient denied having any current chest pain, shortness of breath, cough or hemoptysis.  She denied having any fever or chills.  She has no nausea, vomiting, diarrhea or constipation.  She is very active and exercises at regular basis.  She is here today for evaluation with repeat CBC, iron study and ferritin.  MEDICAL HISTORY: Past Medical History:  Diagnosis Date  . ABDOMINAL PAIN, CHRONIC 10/20/2009  . Acute cystitis 08/17/2009  . ALLERGIC RHINITIS 11/21/2007  . Allergy   . ASYMPTOMATIC POSTMENOPAUSAL STATUS 09/29/2008  . Bariatric surgery status 07/07/2010  . Bariatric surgery status 07/07/2010   Qualifier: Diagnosis of  By: Loanne Drilling MD, Jacelyn Pi   . Cancer ([place])    uterine  . Cataract   . Coronary artery disease   . DEPRESSION 11/21/2007  . DIABETES MELLITUS, TYPE II 07/08/2007  . DYSPNEA 05/20/2009  . Edema 05/20/2009  . Eosinophilic esophagitis 0/11/7492  . FEVER UNSPECIFIED 10/20/2009  . FEVER, HX OF 03/09/2010  . GERD (gastroesophageal reflux disease)   . Headache(784.0) 02/06/2010  . Hepatomegaly 01/06/2008  . HYPERLIPIDEMIA 07/08/2007  . HYPERTENSION 07/08/2007  . LEUKOPENIA, MILD 09/29/2008  . OBESITY  01/06/2008  . OSTEOARTHRITIS 01/06/2008  . Other chronic nonalcoholic liver disease 4/96/7591  . PERIPHERAL NEUROPATHY 11/21/2007  . Peripheral neuropathy   . Postsurgical hypothyroidism 09/19/2010  . SINUSITIS- ACUTE-NOS 11/21/2007  . TB SKIN TEST, POSITIVE 02/06/2010  . THYROID NODULE 03/09/2010    ALLERGIES:  is allergic to ace inhibitors, caffeine, ciprofloxacin, morphine and related, mucinex [guaifenesin er], nsaids, and other.  MEDICATIONS:  Current Outpatient Medications  Medication Sig Dispense Refill  . amoxicillin (AMOXIL) 500 MG capsule TAKE 4 CAPSULES BY MOUTH ONE HOUR BEFORE DENTAL APPT.    [name] aspirin EC 81 MG tablet Take 1 tablet (81 mg total) by mouth daily. Starting 01/18/19 prior to cardiac cath    . canagliflozin (INVOKANA) 300 MG TABS tablet Take 1 tablet (300 mg total) by mouth daily before breakfast. 90 tablet 3  . Cholecalciferol (VITAMIN D3) 125 MCG (5000 UT) CAPS Take 5,000 Units by mouth daily.    [name] desloratadine (CLARINEX) 5 MG tablet Take 5 mg by mouth daily.    . diclofenac sodium (VOLTAREN) 1 % GEL Apply 4 g topically 4 (four) times daily. (Patient taking differently: Apply 4 g topically as needed (pain). ) 100 g 11  . docusate sodium (COLACE) 100 MG capsule Take 200 mg by mouth 2 (two) times daily.    [name] gabapentin (NEURONTIN) 300 MG capsule Take 3 capsules (900 mg total) by mouth 3 (three) times daily. 360 capsule 2  . halobetasol (ULTRAVATE) 0.05 % cream Apply 1 application topically 2 (two) times daily as needed (lichen sclerosus).  2  . HYDROcodone-acetaminophen (NORCO/VICODIN) 5-325 MG tablet Take 1 tablet by mouth daily as needed for moderate pain or severe pain.    [name] levothyroxine (SYNTHROID) 112 MCG tablet TAKE 1 TABLET BY MOUTH EVERY DAY (Patient taking differently: Take 112 mcg by mouth daily before breakfast. ) 90 tablet 3  . MAGNESIUM PO Take 1 tablet by mouth daily as needed (cramps).     . metFORMIN (GLUCOPHAGE-XR) 500 MG 24 hr tablet Take 4 tablets (2,000  mg total) by mouth daily. 360 tablet 3  . MITIGARE 0.6 MG CAPS TAKE 1 CAPSULE BY MOUTH EVERY DAY 90 capsule 0  . Multiple Vitamin (MULTIVITAMIN) tablet Take 2 tablets by mouth daily.     . nitroGLYCERIN (NITROSTAT) 0.4 MG SL tablet Place 1 tablet (0.4 mg total) under the tongue every 5 (five) minutes as needed. 25 tablet 3  . ONETOUCH ULTRA test strip USE TO MONITOR GLUCOSE LEVELS ONCE PER DAY E11.40 100 strip 2  . oxyCODONE-acetaminophen (PERCOCET) 5-325 MG tablet Take 1 tablet by mouth every 6 (six) hours as needed for severe pain. 30 tablet 0  . pantoprazole (PROTONIX) 40 MG tablet TAKE 1 TABLET BY MOUTH TWICE A DAY 180 tablet 1  . phenazopyridine (PYRIDIUM) 100 MG tablet Take 100 mg by mouth 3 (three) times daily as needed for pain.    . Probiotic CAPS Take 1 capsule by mouth daily.    . prochlorperazine (COMPAZINE) 10 MG tablet Take 1 tablet (10 mg total) by mouth every 6 (six) hours as needed (migraine). Take with imitrex and benadryl 30 tablet 0  . repaglinide (PRANDIN) 2 MG tablet Take 1 tablet (2 mg total) by mouth 2 (two) times daily before a meal. 180 tablet 3  . Semaglutide, 1 MG/DOSE, (OZEMPIC, 1 MG/DOSE,) 2 MG/1.5ML SOPN Inject 0.75 mLs (1 mg total) into the skin once a week. 3 pen 3  . traMADol (ULTRAM) 50 MG tablet Take 50 mg by mouth 2 (two) times daily as needed for moderate pain.    [name] atorvastatin (LIPITOR) 40 MG tablet Take 1 tablet (40 mg total) by mouth daily. 90 tablet 1  . Biotin-D POWD Take 300 mg by mouth daily.  (Patient not taking: Reported on 05/10/2020)    . furosemide (LASIX) 20 MG tablet Take 1 tablet (20 mg total) by mouth daily. (Patient taking differently: Take 20 mg by mouth daily as needed for fluid. ) 90 tablet 3  . SUMAtriptan (IMITREX) 25 MG tablet TAKE 1 TABLET BY MOUTH EVERY 2 HOURS AS NEEDED FOR MIGRAINE.TAKE WITH BENADRYL AND COMPAZINE (Patient not taking: Reported on 05/10/2020) 90 tablet 0  . TURMERIC CURCUMIN PO Take 1 capsule by mouth daily. (Patient not  taking: Reported on 05/10/2020)     No current facility-administered medications for this visit.    SURGICAL HISTORY:  Past Surgical History:  Procedure Laterality Date  . ABDOMINAL HYSTERECTOMY    . BASAL CELL CARCINOMA EXCISION    . CARDIAC CATHETERIZATION    . COLONOSCOPY    . EYE SURGERY Bilateral 08/2018, 09/2018  . FOOT SURGERY Left    10/2019  . KNEE ARTHROSCOPY Left   . LEFT HEART CATH AND CORONARY ANGIOGRAPHY N/A 01/20/2019   Procedure: LEFT HEART CATH AND CORONARY ANGIOGRAPHY;  Surgeon: Belva Crome, MD;  Location: [facility];  Service: Cardiovascular;  Laterality: N/A;  . LYMPH NODE DISSECTION    . OOPHORECTOMY    . Right total knee replacement    . ROUX-EN-Y GASTRIC BYPASS  2011  . SKIN TAG REMOVAL    . THYROIDECTOMY    . TONSILLECTOMY AND ADENOIDECTOMY    . UPPER GASTROINTESTINAL ENDOSCOPY      REVIEW OF SYSTEMS:  A comprehensive review of systems was negative except for: Constitutional: positive for fatigue   PHYSICAL EXAMINATION: General appearance: alert, cooperative, fatigued and no distress Head: Normocephalic, without obvious abnormality, atraumatic Neck: no adenopathy, no JVD, supple, symmetrical, trachea midline and thyroid not enlarged, symmetric, no tenderness/mass/nodules Lymph nodes: Cervical, supraclavicular, and axillary nodes normal. Resp: clear to auscultation bilaterally Back: symmetric, no curvature. ROM normal. No CVA tenderness. Cardio: regular rate and rhythm, S1, S2 normal, no murmur, click, rub or gallop GI: soft, non-tender; bowel sounds normal; no masses,  no organomegaly Extremities: extremities normal, atraumatic, no cyanosis or edema  ECOG PERFORMANCE STATUS: 1 - Symptomatic but completely ambulatory  Blood pressure (!) 107/57, pulse 73, temperature 97.7 F (36.5 C), temperature source Temporal, resp. rate 18, height 5' 11"  (1.803 m), weight 168 lb 11.2 oz (76.5 kg), SpO2 100 %.  LABORATORY DATA: Lab Results  Component  Value Date   WBC 3.7 (L) 05/10/2020   HGB 12.3 05/10/2020   HCT 38.9 05/10/2020   MCV 102.9 (H) 05/10/2020   PLT 190 05/10/2020      Chemistry      Component Value Date/Time   NA 139 02/29/2020 1303   NA 144 07/06/2019 1524   K 4.1 02/29/2020 1303   CL 111 02/29/2020 1303   CO2 21 (L) 02/29/2020 1303   BUN 12 02/29/2020 1303   BUN 22 07/06/2019 1524   CREATININE 1.06 (H) 02/29/2020 1303   CREATININE 1.06 (H) 02/10/2020 1157      Component Value Date/Time   CALCIUM 8.3 (L) 02/29/2020 1303   CALCIUM 8.9 01/30/2013 1017   ALKPHOS 125 02/29/2020 1303   AST 25 02/29/2020 1303   ALT 37 02/29/2020 1303   BILITOT 0.4 02/29/2020 1303       RADIOGRAPHIC STUDIES: US Guided Needle Placement - No Linked Charges  Result Date: 05/04/2020 Ultrasound-guided right hip injection: After sterile prep with Betadine, injected 8 cc 1% lidocaine without epinephrine and 40 mg methylprednisolone using a 22-gauge spinal needle, passing the needle through the iliofemoral ligament into the femoral head/neck junction.  Injectate seen filling the joint capsule.  She had good immediate relief.  If this does not eliminate her pain, we will try a greater trochanter injection.  XR HIP UNILAT W OR W/O PELVIS 2-3 VIEWS RIGHT  Result Date: 05/04/2020 Moderate right hip DJD with subchondral cysts on both the femoral head and acetabulum.   ASSESSMENT AND PLAN: This is a 64 years old white female with history of iron deficiency anemia secondary to menorrhagia.  The patient started treatment with iron infusion with Venofer for four doses. She continues to have persistent fatigue but repeat CBC and iron study as well as ferritin today showed significant improvement in her condition with normalization of her hemoglobin and hematocrit as well as the serum iron and ferritin. It was recommended for the patient at [facility] to receive five more doses of Venofer for a total dose of 1750.  She already received 800 mg  of iron in the last 4 infusion. I will arrange for the patient to receive five more weekly doses of Venofer. I will see her back for follow-up visit in 3 months for evaluation with repeat CBC, iron study and ferritin. The patient was advised to call immediately if she has  any concerning symptoms in the interval. The patient voices understanding of current disease status and treatment options and is in agreement with the current care plan.  All questions were answered. The patient knows to call the clinic with any problems, questions or concerns. We can certainly see the patient much sooner if necessary.  Disclaimer: This note was dictated with voice recognition software. Similar sounding words can inadvertently be transcribed and may not be corrected upon review.

## 2020-05-11 ENCOUNTER — Telehealth: Payer: Self-pay | Admitting: Internal Medicine

## 2020-05-11 ENCOUNTER — Ambulatory Visit (INDEPENDENT_AMBULATORY_CARE_PROVIDER_SITE_OTHER): Payer: Medicare Other | Admitting: Podiatry

## 2020-05-11 VITALS — Temp 97.3°F

## 2020-05-11 DIAGNOSIS — S92002A Unspecified fracture of left calcaneus, initial encounter for closed fracture: Secondary | ICD-10-CM | POA: Diagnosis not present

## 2020-05-11 DIAGNOSIS — M659 Synovitis and tenosynovitis, unspecified: Secondary | ICD-10-CM | POA: Diagnosis not present

## 2020-05-11 DIAGNOSIS — M25472 Effusion, left ankle: Secondary | ICD-10-CM

## 2020-05-11 DIAGNOSIS — M65972 Unspecified synovitis and tenosynovitis, left ankle and foot: Secondary | ICD-10-CM

## 2020-05-11 DIAGNOSIS — Z9889 Other specified postprocedural states: Secondary | ICD-10-CM

## 2020-05-11 NOTE — Telephone Encounter (Signed)
Called and left msg about added appts

## 2020-05-11 NOTE — Progress Notes (Signed)
HPI: 64 y.o. female presenting today for follow-up evaluation regarding left foot and ankle swelling with stiffness.  Patient has history of plantar fasciectomy with excision of plantar heel spur left foot.  DOS: 10/22/2019.  She subsequently had a fracture of the calcaneus immediately postsurgically on 10/23/2019.  Patient went on to heal without complication and routine healing of the fracture.  Currently she has been walking approximately 3 miles per day and has minimal symptoms or pain.  She states that she only has very mild discomfort on occasion.  She wears good supportive tennis shoes and she had been going to physical therapy but she does not believe physical therapy is helping.  Her main complaint today is chronic swelling to the left foot and ankle.  She states that she does have a history of left ankle swelling even prior to surgery.  She states that it feels stiff on occasion.  She presents for further treatment evaluation  Past Medical History:  Diagnosis Date  . ABDOMINAL PAIN, CHRONIC 10/20/2009  . Acute cystitis 08/17/2009  . ALLERGIC RHINITIS 11/21/2007  . Allergy   . ASYMPTOMATIC POSTMENOPAUSAL STATUS 09/29/2008  . Bariatric surgery status 07/07/2010  . Bariatric surgery status 07/07/2010   Qualifier: Diagnosis of  By: Loanne Drilling MD, Jacelyn Pi   . Cancer ([place])    uterine  . Cataract   . Coronary artery disease   . DEPRESSION 11/21/2007  . DIABETES MELLITUS, TYPE II 07/08/2007  . DYSPNEA 05/20/2009  . Edema 05/20/2009  . Eosinophilic esophagitis 0/07/6282  . FEVER UNSPECIFIED 10/20/2009  . FEVER, HX OF 03/09/2010  . GERD (gastroesophageal reflux disease)   . Headache(784.0) 02/06/2010  . Hepatomegaly 01/06/2008  . HYPERLIPIDEMIA 07/08/2007  . HYPERTENSION 07/08/2007  . LEUKOPENIA, MILD 09/29/2008  . OBESITY 01/06/2008  . OSTEOARTHRITIS 01/06/2008  . Other chronic nonalcoholic liver disease 6/62/9476  . PERIPHERAL NEUROPATHY 11/21/2007  . Peripheral neuropathy   . Postsurgical hypothyroidism  09/19/2010  . SINUSITIS- ACUTE-NOS 11/21/2007  . TB SKIN TEST, POSITIVE 02/06/2010  . THYROID NODULE 03/09/2010     Physical Exam: General: The patient is alert and oriented x3 in no acute distress.  Dermatology: Skin incision to the lateral aspect of the heel is intact with staples intact.  There is some mild maceration noted around the incision site.  Vascular: Palpable pedal pulses bilaterally.  Minimal edema noted to the heel with improved ecchymosis. Capillary refill within normal limits.  Neurological: Epicritic and protective threshold grossly intact bilaterally.   Musculoskeletal Exam: Status post plantar heel spur exostectomy with partial plantar fasciectomy.  Radiographic exam: Fracture noted through the body of the calcaneus with evidence of callus formation and healing.  Assessment: 1. Status post plantar heel exostectomy with partial plantar fasciectomy left DOS: 10/22/2019 2. Calcaneal fracture left, stable-healed 3.  Chronic left lower extremity edema   Plan of Care:  1. Patient evaluated.  2.  Continue full activity with no restrictions in good supportive sneakers from [employer].  3.  Injection of 0.5 cc [place] Soluspan injection of the left ankle joint to see if this helps decrease some of the stiffness and edema that the patient is feeling 4.  Compression anklet dispensed.  Wear daily. 5.  Return to clinic as needed   Edrick Kins, DPM [facility]  Dr. Edrick Kins, DPM    [address]  [place], [place] [address]                Office [phone]  Fax [phone]

## 2020-05-13 ENCOUNTER — Other Ambulatory Visit: Payer: Self-pay | Admitting: Medical Oncology

## 2020-05-13 ENCOUNTER — Inpatient Hospital Stay: Payer: Medicare Other | Attending: Physician Assistant

## 2020-05-13 ENCOUNTER — Other Ambulatory Visit: Payer: Self-pay

## 2020-05-13 VITALS — BP 118/67 | HR 70 | Resp 16

## 2020-05-13 DIAGNOSIS — D500 Iron deficiency anemia secondary to blood loss (chronic): Secondary | ICD-10-CM | POA: Diagnosis not present

## 2020-05-13 DIAGNOSIS — D508 Other iron deficiency anemias: Secondary | ICD-10-CM

## 2020-05-13 DIAGNOSIS — D539 Nutritional anemia, unspecified: Secondary | ICD-10-CM

## 2020-05-13 DIAGNOSIS — N920 Excessive and frequent menstruation with regular cycle: Secondary | ICD-10-CM | POA: Diagnosis not present

## 2020-05-13 MED ORDER — SODIUM CHLORIDE 0.9 % IV SOLN
Freq: Once | INTRAVENOUS | Status: AC
  Filled 2020-05-13: qty 250

## 2020-05-13 MED ORDER — SODIUM CHLORIDE 0.9 % IV SOLN
200.0000 mg | Freq: Once | INTRAVENOUS | Status: AC
  Administered 2020-05-13: 200 mg via INTRAVENOUS
  Filled 2020-05-13: qty 200

## 2020-05-13 MED ORDER — DIPHENHYDRAMINE HCL 25 MG PO TABS
50.0000 mg | ORAL_TABLET | Freq: Once | ORAL | Status: AC
  Administered 2020-05-13: 50 mg via ORAL
  Filled 2020-05-13: qty 2

## 2020-05-13 MED ORDER — ACETAMINOPHEN 325 MG PO TABS
ORAL_TABLET | ORAL | Status: AC
  Filled 2020-05-13: qty 2

## 2020-05-13 MED ORDER — DIPHENHYDRAMINE HCL 25 MG PO CAPS
ORAL_CAPSULE | ORAL | Status: AC
  Filled 2020-05-13: qty 2

## 2020-05-13 MED ORDER — ACETAMINOPHEN 325 MG PO TABS
650.0000 mg | ORAL_TABLET | Freq: Once | ORAL | Status: AC
  Administered 2020-05-13: 650 mg via ORAL

## 2020-05-13 NOTE — Patient Instructions (Signed)

## 2020-05-19 ENCOUNTER — Other Ambulatory Visit: Payer: Self-pay | Admitting: *Deleted

## 2020-05-19 ENCOUNTER — Ambulatory Visit (INDEPENDENT_AMBULATORY_CARE_PROVIDER_SITE_OTHER): Payer: Medicare Other

## 2020-05-19 DIAGNOSIS — Z780 Asymptomatic menopausal state: Secondary | ICD-10-CM

## 2020-05-19 DIAGNOSIS — Z0000 Encounter for general adult medical examination without abnormal findings: Secondary | ICD-10-CM

## 2020-05-19 DIAGNOSIS — C55 Malignant neoplasm of uterus, part unspecified: Secondary | ICD-10-CM

## 2020-05-19 NOTE — Progress Notes (Signed)
bone density scan order

## 2020-05-19 NOTE — Patient Instructions (Addendum)
Savannah [name] , Thank you for taking time to come for your Medicare Wellness Visit. I appreciate your ongoing commitment to your health goals. Please review the following plan we discussed and let me know if I can assist you in the future.   Screening recommendations/referrals: Colonoscopy: Done 01/31/17 Mammogram: Done 06/29/20 Bone Density: requested order Recommended yearly ophthalmology/optometry visit for glaucoma screening and checkup Recommended yearly dental visit for hygiene and checkup  Vaccinations: Influenza vaccine: Up to date Pneumococcal vaccine: Up to date Tdap vaccine: Due information given Shingles vaccine: Shingrix discussed. Please contact your pharmacy for coverage information.     Covid-19:Completed 2/8 & 01/14/20  Advanced directives:Please bring a copy of your health care power of attorney and living will to the office at your convenience.  Conditions/risks identified: Continue with exercise and stregthening  Next appointment: Follow up in one year for your annual wellness visit    Preventive Care 65 Years and Older, Female Preventive care refers to lifestyle choices and visits with your health care provider that can promote health and wellness. What does preventive care include?  A yearly physical exam. This is also called an annual well check.  Dental exams once or twice a year.  Routine eye exams. Ask your health care provider how often you should have your eyes checked.  Personal lifestyle choices, including:  Daily care of your teeth and gums.  Regular physical activity.  Eating a healthy diet.  Avoiding tobacco and drug use.  Limiting alcohol use.  Practicing safe sex.  Taking low-dose aspirin every day.  Taking vitamin and mineral supplements as recommended by your health care provider. What happens during an annual well check? The services and screenings done by your health care provider during your annual well check will depend on your  age, overall health, lifestyle risk factors, and family history of disease. Counseling  Your health care provider may ask you questions about your:  Alcohol use.  Tobacco use.  Drug use.  Emotional well-being.  Home and relationship well-being.  Sexual activity.  Eating habits.  History of falls.  Memory and ability to understand (cognition).  Work and work Statistician.  Reproductive health. Screening  You may have the following tests or measurements:  Height, weight, and BMI.  Blood pressure.  Lipid and cholesterol levels. These may be checked every 5 years, or more frequently if you are over 49 years old.  Skin check.  Lung cancer screening. You may have this screening every year starting at age 83 if you have a 30-pack-year history of smoking and currently smoke or have quit within the past 15 years.  Fecal occult blood test (FOBT) of the stool. You may have this test every year starting at age 40.  Flexible sigmoidoscopy or colonoscopy. You may have a sigmoidoscopy every 5 years or a colonoscopy every 10 years starting at age 44.  Hepatitis C blood test.  Hepatitis B blood test.  Sexually transmitted disease (STD) testing.  Diabetes screening. This is done by checking your blood sugar (glucose) after you have not eaten for a while (fasting). You may have this done every 1-3 years.  Bone density scan. This is done to screen for osteoporosis. You may have this done starting at age 23.  Mammogram. This may be done every 1-2 years. Talk to your health care provider about how often you should have regular mammograms. Talk with your health care provider about your test results, treatment options, and if necessary, the need for more tests.  Vaccines  Your health care provider may recommend certain vaccines, such as:  Influenza vaccine. This is recommended every year.  Tetanus, diphtheria, and acellular pertussis (Tdap, Td) vaccine. You may need a Td booster  every 10 years.  Zoster vaccine. You may need this after age 45.  Pneumococcal 13-valent conjugate (PCV13) vaccine. One dose is recommended after age 84.  Pneumococcal polysaccharide (PPSV23) vaccine. One dose is recommended after age 36. Talk to your health care provider about which screenings and vaccines you need and how often you need them. This information is not intended to replace advice given to you by your health care provider. Make sure you discuss any questions you have with your health care provider. Document Released: 11/25/2015 Document Revised: 07/18/2016 Document Reviewed: 08/30/2015 Elsevier Interactive Patient Education  2017 [employer] [employer] Prevention in the Home Falls can cause injuries. They can happen to people of all ages. There are many things you can do to make your home safe and to help prevent falls. What can I do on the outside of my home?  Regularly fix the edges of walkways and driveways and fix any cracks.  Remove anything that might make you trip as you walk through a door, such as a raised step or threshold.  Trim any bushes or trees on the path to your home.  Use bright outdoor lighting.  Clear any walking paths of anything that might make someone trip, such as rocks or tools.  Regularly check to see if handrails are loose or broken. Make sure that both sides of any steps have handrails.  Any raised decks and porches should have guardrails on the edges.  Have any leaves, snow, or ice cleared regularly.  Use sand or salt on walking paths during winter.  Clean up any spills in your garage right away. This includes oil or grease spills. What can I do in the bathroom?  Use night lights.  Install grab bars by the toilet and in the tub and shower. Do not use towel bars as grab bars.  Use non-skid mats or decals in the tub or shower.  If you need to sit down in the shower, use a plastic, non-slip stool.  Keep the floor dry. Clean up any  water that spills on the floor as soon as it happens.  Remove soap buildup in the tub or shower regularly.  Attach bath mats securely with double-sided non-slip rug tape.  Do not have throw rugs and other things on the floor that can make you trip. What can I do in the bedroom?  Use night lights.  Make sure that you have a light by your bed that is easy to reach.  Do not use any sheets or blankets that are too big for your bed. They should not hang down onto the floor.  Have a firm chair that has side arms. You can use this for support while you get dressed.  Do not have throw rugs and other things on the floor that can make you trip. What can I do in the kitchen?  Clean up any spills right away.  Avoid walking on wet floors.  Keep items that you use a lot in easy-to-reach places.  If you need to reach something above you, use a strong step stool that has a grab bar.  Keep electrical cords out of the way.  Do not use floor polish or wax that makes floors slippery. If you must use wax, use non-skid floor wax.  Do not have throw rugs and other things on the floor that can make you trip. What can I do with my stairs?  Do not leave any items on the stairs.  Make sure that there are handrails on both sides of the stairs and use them. Fix handrails that are broken or loose. Make sure that handrails are as long as the stairways.  Check any carpeting to make sure that it is firmly attached to the stairs. Fix any carpet that is loose or worn.  Avoid having throw rugs at the top or bottom of the stairs. If you do have throw rugs, attach them to the floor with carpet tape.  Make sure that you have a light switch at the top of the stairs and the bottom of the stairs. If you do not have them, ask someone to add them for you. What else can I do to help prevent falls?  Wear shoes that:  Do not have high heels.  Have rubber bottoms.  Are comfortable and fit you well.  Are closed  at the toe. Do not wear sandals.  If you use a stepladder:  Make sure that it is fully opened. Do not climb a closed stepladder.  Make sure that both sides of the stepladder are locked into place.  Ask someone to hold it for you, if possible.  Clearly mark and make sure that you can see:  Any grab bars or handrails.  First and last steps.  Where the edge of each step is.  Use tools that help you move around (mobility aids) if they are needed. These include:  Canes.  Walkers.  Scooters.  Crutches.  Turn on the lights when you go into a dark area. Replace any light bulbs as soon as they burn out.  Set up your furniture so you have a clear path. Avoid moving your furniture around.  If any of your floors are uneven, fix them.  If there are any pets around you, be aware of where they are.  Review your medicines with your doctor. Some medicines can make you feel dizzy. This can increase your chance of falling. Ask your doctor what other things that you can do to help prevent falls. This information is not intended to replace advice given to you by your health care provider. Make sure you discuss any questions you have with your health care provider. Document Released: 08/25/2009 Document Revised: 04/05/2016 Document Reviewed: 12/03/2014 Elsevier Interactive Patient Education  2017 [employer].

## 2020-05-19 NOTE — Progress Notes (Signed)
Subjective:   Savannah [name] is a 64 y.o. female who presents for an Initial Medicare Annual Wellness Visit. Virtual Visit via Telephone Note  I connected with  [name] on 05/19/20 at 12:00 PM EDT by telephone and verified that I am speaking with the correct person using two identifiers.  Medicare Annual Wellness visit completed telephonically due to Covid-19 pandemic.   Persons participating in this call: This Health Coach and this patient.   Location: Patient: Home Provider: Office   I discussed the limitations, risks, security and privacy concerns of performing an evaluation and management service by telephone and the availability of in person appointments. The patient expressed understanding and agreed to proceed.  Unable to perform video visit due to video visit attempted and failed and/or patient does not have video capability.   Some vital signs may be absent or patient reported.   Willette Brace, LPN      Review of Systems     Cardiac Risk Factors include: advanced age (>58mn, >>19women);dyslipidemia;diabetes mellitus     Objective:    Today's Vitals   05/19/20 1157  PainSc: 3    There is no height or weight on file to calculate BMI.  Advanced Directives 05/19/2020 05/13/2020 05/13/2020 03/14/2020 02/29/2020 02/26/2020 01/20/2019  Does Patient Have a Medical Advance Directive? Yes Yes No No No Yes Yes  Type of AParamed[place]of [place] will [place] will - - - [place]portLiving will [place] ShoresLiving will  Does patient want to make changes to medical advance directive? - No - Patient declined - - - - -  Copy of [place] Chart? No - copy requested No - copy requested - - - - -  Would patient like information on creating a medical advance directive? - No - Patient declined No - Patient declined No - Patient declined No - Patient declined - -    Current  Medications (verified) Outpatient Encounter Medications as of 05/19/2020  Medication Sig  . amoxicillin (AMOXIL) 500 MG capsule TAKE 4 CAPSULES BY MOUTH ONE HOUR BEFORE DENTAL APPT.  .Marland Kitchenaspirin EC 81 MG tablet Take 1 tablet (81 mg total) by mouth daily. Starting 01/18/19 prior to cardiac cath  . canagliflozin (INVOKANA) 300 MG TABS tablet Take 1 tablet (300 mg total) by mouth daily before breakfast.  . Cholecalciferol (VITAMIN D3) 125 MCG (5000 UT) CAPS Take 5,000 Units by mouth daily.  .Marland Kitchendesloratadine (CLARINEX) 5 MG tablet Take 5 mg by mouth daily.  . diclofenac sodium (VOLTAREN) 1 % GEL Apply 4 g topically 4 (four) times daily. (Patient taking differently: Apply 4 g topically as needed (pain). )  . docusate sodium (COLACE) 100 MG capsule Take 200 mg by mouth 2 (two) times daily.  .Marland Kitchengabapentin (NEURONTIN) 300 MG capsule Take 3 capsules (900 mg total) by mouth 3 (three) times daily.  . halobetasol (ULTRAVATE) 0.05 % cream Apply 1 application topically 2 (two) times daily as needed (lichen sclerosus).   .Marland KitchenHYDROcodone-acetaminophen (NORCO/VICODIN) 5-325 MG tablet Take 1 tablet by mouth daily as needed for moderate pain or severe pain.  .Marland Kitchenlevothyroxine (SYNTHROID) 112 MCG tablet TAKE 1 TABLET BY MOUTH EVERY DAY (Patient taking differently: Take 112 mcg by mouth daily before breakfast. )  . MAGNESIUM PO Take 1 tablet by mouth daily as needed (cramps).   . metFORMIN (GLUCOPHAGE-XR) 500 MG 24 hr tablet Take 4 tablets (2,000 mg total) by  mouth daily.  [name] MITIGARE 0.6 MG CAPS TAKE 1 CAPSULE BY MOUTH EVERY DAY  . Multiple Vitamin (MULTIVITAMIN) tablet Take 2 tablets by mouth daily.   . nitroGLYCERIN (NITROSTAT) 0.4 MG SL tablet Place 1 tablet (0.4 mg total) under the tongue every 5 (five) minutes as needed.  Glory Rosebush ULTRA test strip USE TO MONITOR GLUCOSE LEVELS ONCE PER DAY E11.40  . oxyCODONE-acetaminophen (PERCOCET) 5-325 MG tablet Take 1 tablet by mouth every 6 (six) hours as needed for severe pain.  .  pantoprazole (PROTONIX) 40 MG tablet TAKE 1 TABLET BY MOUTH TWICE A DAY  . phenazopyridine (PYRIDIUM) 100 MG tablet Take 100 mg by mouth 3 (three) times daily as needed for pain.  . Probiotic CAPS Take 1 capsule by mouth daily.  . repaglinide (PRANDIN) 2 MG tablet Take 1 tablet (2 mg total) by mouth 2 (two) times daily before a meal.  . traMADol (ULTRAM) 50 MG tablet Take 50 mg by mouth 2 (two) times daily as needed for moderate pain.  [name] atorvastatin (LIPITOR) 40 MG tablet Take 1 tablet (40 mg total) by mouth daily.  . furosemide (LASIX) 20 MG tablet Take 1 tablet (20 mg total) by mouth daily. (Patient taking differently: Take 20 mg by mouth daily as needed for fluid. )  . Semaglutide, 1 MG/DOSE, (OZEMPIC, 1 MG/DOSE,) 2 MG/1.5ML SOPN Inject 0.75 mLs (1 mg total) into the skin once a week. (Patient not taking: Reported on 05/19/2020)  . [DISCONTINUED] Biotin-D POWD Take 300 mg by mouth daily.  (Patient not taking: Reported on 05/19/2020)  . [DISCONTINUED] prochlorperazine (COMPAZINE) 10 MG tablet Take 1 tablet (10 mg total) by mouth every 6 (six) hours as needed (migraine). Take with imitrex and benadryl (Patient not taking: Reported on 05/19/2020)  . [DISCONTINUED] SUMAtriptan (IMITREX) 25 MG tablet TAKE 1 TABLET BY MOUTH EVERY 2 HOURS AS NEEDED FOR MIGRAINE.TAKE WITH BENADRYL AND COMPAZINE (Patient not taking: Reported on 05/19/2020)  . [DISCONTINUED] TURMERIC CURCUMIN PO Take 1 capsule by mouth daily.    No facility-administered encounter medications on file as of 05/19/2020.    Allergies (verified) Ace inhibitors, Caffeine, Ciprofloxacin, Morphine and related, Mucinex [guaifenesin er], Nsaids, and Other   History: Past Medical History:  Diagnosis Date  . ABDOMINAL PAIN, CHRONIC 10/20/2009  . Acute cystitis 08/17/2009  . ALLERGIC RHINITIS 11/21/2007  . Allergy   . ASYMPTOMATIC POSTMENOPAUSAL STATUS 09/29/2008  . Bariatric surgery status 07/07/2010  . Bariatric surgery status 07/07/2010   Qualifier:  Diagnosis of  By: Loanne Drilling MD, Jacelyn Pi   . Cancer ([place])    uterine  . Cataract   . Coronary artery disease   . DEPRESSION 11/21/2007  . DIABETES MELLITUS, TYPE II 07/08/2007  . DYSPNEA 05/20/2009  . Edema 05/20/2009  . Eosinophilic esophagitis 0/0/8676  . FEVER UNSPECIFIED 10/20/2009  . FEVER, HX OF 03/09/2010  . GERD (gastroesophageal reflux disease)   . Headache(784.0) 02/06/2010  . Hepatomegaly 01/06/2008  . HYPERLIPIDEMIA 07/08/2007  . HYPERTENSION 07/08/2007  . LEUKOPENIA, MILD 09/29/2008  . OBESITY 01/06/2008  . OSTEOARTHRITIS 01/06/2008  . Other chronic nonalcoholic liver disease 1/95/0932  . PERIPHERAL NEUROPATHY 11/21/2007  . Peripheral neuropathy   . Postsurgical hypothyroidism 09/19/2010  . SINUSITIS- ACUTE-NOS 11/21/2007  . TB SKIN TEST, POSITIVE 02/06/2010  . THYROID NODULE 03/09/2010   Past Surgical History:  Procedure Laterality Date  . ABDOMINAL HYSTERECTOMY    . BASAL CELL CARCINOMA EXCISION    . CARDIAC CATHETERIZATION    . COLONOSCOPY    .  EYE SURGERY Bilateral 08/2018, 09/2018  . FOOT SURGERY Left    10/2019  . KNEE ARTHROSCOPY Left   . LEFT HEART CATH AND CORONARY ANGIOGRAPHY N/A 01/20/2019   Procedure: LEFT HEART CATH AND CORONARY ANGIOGRAPHY;  Surgeon: Belva Crome, MD;  Location: [facility];  Service: Cardiovascular;  Laterality: N/A;  . LYMPH NODE DISSECTION    . OOPHORECTOMY    . Right total knee replacement    . ROUX-EN-Y GASTRIC BYPASS  2011  . SKIN TAG REMOVAL    . THYROIDECTOMY    . TONSILLECTOMY AND ADENOIDECTOMY    . UPPER GASTROINTESTINAL ENDOSCOPY     Family History  Problem Relation Age of Onset  . Multiple sclerosis Sister   . Breast cancer Sister        breast onset age 52  . Osteoporosis Sister   . Diabetes Mother   . Diabetes Father   . Congestive Heart Failure Father   . Hypertension Father   . Myelodysplastic syndrome Father   . Liver cancer Maternal Aunt   . Breast cancer Maternal Aunt   . Diabetes Maternal Aunt   . Colon  cancer Maternal Uncle   . Diabetes Maternal Uncle   . Diabetes Maternal Grandmother   . Diabetes Paternal Grandmother    Social History   Socioeconomic History  . Marital status: Single    Spouse name: Not on file  . Number of children: 0  . Years of education: 16  . Highest education level: Bachelor's degree (e.g., BA, AB, BS)  Occupational History  . Occupation: Optometrist  . Occupation: Retired  Tobacco Use  . Smoking status: Former Smoker    Packs/day: 1.50    Years: 15.00    Pack years: 22.50    Types: Cigarettes  . Smokeless tobacco: Never Used  Vaping Use  . Vaping Use: Never used  Substance and Sexual Activity  . Alcohol use: Yes    Alcohol/week: 1.0 standard drink    Types: 1 Glasses of wine per week    Comment: rarely  . Drug use: Never  . Sexual activity: Not Currently  Other Topics Concern  . Not on file  Social History Narrative  . Not on file   Social Determinants of Health   Financial Resource Strain: Low Risk   . Difficulty of Paying Living Expenses: Not hard at all  Food Insecurity: No Food Insecurity  . Worried About Charity fundraiser in the Last Year: Never true  . Ran Out of Food in the Last Year: Never true  Transportation Needs: No Transportation Needs  . Lack of Transportation (Medical): No  . Lack of Transportation (Non-Medical): No  Physical Activity: Sufficiently Active  . Days of Exercise per Week: 5 days  . Minutes of Exercise per Session: 60 min  Stress: No Stress Concern Present  . Feeling of Stress : Not at all  Social Connections: Moderately Integrated  . Frequency of Communication with Friends and Family: More than three times a week  . Frequency of Social Gatherings with Friends and Family: More than three times a week  . Attends Religious Services: 1 to 4 times per year  . Active Member of Clubs or Organizations: Yes  . Attends Archivist Meetings: 1 to 4 times per year  . Marital Status: Never married     Tobacco Counseling Counseling given: Not Answered   Clinical Intake:  Pre-visit preparation completed: Yes  Pain : 0-10 Pain Score: 3  Pain  Type: Chronic pain Pain Location: Other (Comment) (joint pain) Pain Descriptors / Indicators: Aching Pain Onset: More than a month ago Pain Frequency: Intermittent     BMI - recorded: 23.54 Nutritional Status: BMI of 19-24  Normal Diabetes: Yes CBG done?: Yes (103) CBG resulted in Enter/ Edit results?: Yes Did pt. bring in CBG monitor from home?: No  How often do you need to have someone help you when you read instructions, pamphlets, or other written materials from your doctor or pharmacy?: 1 - Never  Diabetic?Yes  Interpreter Needed?: No  Information entered by :: Charlott Rakes, LPN   Activities of Daily Living In your present state of health, do you have any difficulty performing the following activities: 05/19/2020  Hearing? Y  Comment mild hearing loss  Vision? N  Difficulty concentrating or making decisions? Y  Comment difficulty concentrating  Walking or climbing stairs? Y  Comment balance and legs are weak at times  Dressing or bathing? N  Doing errands, shopping? N  Preparing Food and eating ? N  Using the Toilet? N  In the past six months, have you accidently leaked urine? N  Do you have problems with loss of bowel control? N  Managing your Medications? N  Managing your Finances? N  Housekeeping or managing your Housekeeping? N  Some recent data might be hidden    Patient Care Team: Vivi Barrack, MD as PCP - General (Family Medicine) Elouise Munroe, MD as PCP - Cardiology (Cardiology) Janyth Contes, MD as Attending Physician (Obstetrics and Gynecology) Deneise Lever, MD as Consulting Physician (Pulmonary Disease)  Indicate any recent Medical Services you may have received from other than [facility] providers in the past year (date may be approximate).     Assessment:   This is a routine  wellness examination for [facility].  Hearing/Vision screen  Hearing Screening   125Hz  250Hz  500Hz  1000Hz  2000Hz  3000Hz  4000Hz  6000Hz  8000Hz   Right ear:           Left ear:           Comments: Pt states mild hearing loss declines hearing aid at this time related to silicone allergy  Vision Screening Comments: Pt will follow up with [facility]  Dietary issues and exercise activities discussed: Current Exercise Habits: Home exercise routine, Type of exercise: walking, Time (Minutes): 60, Frequency (Times/Week): 5, Weekly Exercise (Minutes/Week): 300, Intensity: Moderate, Exercise limited by: orthopedic condition(s) (balance)  Goals    . patient stated     Lose weight and get more active      Depression Screen PHQ 2/9 Scores 05/19/2020 07/17/2019 06/04/2019 06/04/2019  PHQ - 2 Score 0 0 0 0    Fall Risk Fall Risk  05/19/2020  Falls in the past year? 1  Number falls in past yr: 1  Injury with Fall? 0  Risk for fall due to : Impaired balance/gait  Follow up Falls prevention discussed    Any stairs in or around the home? Yes  If so, are there any without handrails? Yes  Home free of loose throw rugs in walkways, pet beds, electrical cords, etc? Yes  Adequate lighting in your home to reduce risk of falls? Yes   ASSISTIVE DEVICES UTILIZED TO PREVENT FALLS:  Life alert? No  Use of a cane, walker or w/c? No  Grab bars in the bathroom? No  Shower chair or bench in shower? Yes  Elevated toilet seat or a handicapped toilet? Yes   TIMED UP AND GO:  Was the test performed? No .    Cognitive Function:        Immunizations Immunization History  Administered Date(s) Administered  . Fluad Quad(high Dose 65+) 07/13/2019  . Influenza Inj Mdck Quad Pf 10/19/2017, 07/23/2018  . Influenza Whole 07/30/2008, 08/17/2009, 09/19/2010  . Influenza,inj,Quad PF,6-35 Mos 07/14/2015  . Influenza-Unspecified 10/13/2011, 08/12/2013, 07/13/2017, 07/23/2018  . PFIZER SARS-COV-2 Vaccination 12/21/2019,  01/14/2020  . Pneumococcal Conjugate-13 10/14/2015  . Pneumococcal Polysaccharide-23 06/25/2014  . Zoster 06/25/2014    TDAP status: Due, Education has been provided regarding the importance of this vaccine. Advised may receive this vaccine at local pharmacy or Health Dept. Aware to provide a copy of the vaccination record if obtained from local pharmacy or Health Dept. Verbalized acceptance and understanding. Flu Vaccine status: Up to date Pneumococcal vaccine status: Up to date Covid-19 vaccine status: Completed vaccines  Qualifies for Shingles Vaccine? Yes   Zostavax completed Yes   Shingrix Completed?: No.    Education has been provided regarding the importance of this vaccine. Patient has been advised to call insurance company to determine out of pocket expense if they have not yet received this vaccine. Advised may also receive vaccine at local pharmacy or Health Dept. Verbalized acceptance and understanding.  Screening Tests Health Maintenance  Topic Date Due  . TETANUS/TDAP  Never done  . OPHTHALMOLOGY EXAM  05/05/2013  . DEXA SCAN  Never done  . PNA vac Low Risk Adult (2 of 2 - PPSV23) 06/26/2019  . INFLUENZA VACCINE  06/12/2020  . HEMOGLOBIN A1C  11/03/2020  . URINE MICROALBUMIN  01/13/2021  . FOOT EXAM  05/04/2021  . MAMMOGRAM  06/29/2021  . COLONOSCOPY  01/31/2022  . COVID-19 Vaccine  Completed  . Hepatitis C Screening  Completed    Health Maintenance  Health Maintenance Due  Topic Date Due  . TETANUS/TDAP  Never done  . OPHTHALMOLOGY EXAM  05/05/2013  . DEXA SCAN  Never done  . PNA vac Low Risk Adult (2 of 2 - PPSV23) 06/26/2019    Colorectal cancer screening: Completed 01/31/17. Repeat every 5 years Mammogram status: Completed 06/30/19. Repeat every year Bone Density status: Ordered 05/19/20. Pt provided with contact info and advised to call to schedule appt.   Hepatitis C Screening: does qualify  Vision Screening: Recommended annual ophthalmology exams for  early detection of glaucoma and other disorders of the eye. Is the patient up to date with their annual eye exam?  No  Who is the provider or what is the name of the office in which the patient attends annual eye exams? Pt will follow up with [facility] Dr Monica Martinez or Dr. Amalia Hailey.   Dental Screening: Recommended annual dental exams for proper oral hygiene  Community Resource Referral / Chronic Care Management: CRR required this visit?  No   CCM required this visit?  Yes      Plan:     I have personally reviewed and noted the following in the patient's chart:   . Medical and social history . Use of alcohol, tobacco or illicit drugs  . Current medications and supplements . Functional ability and status . Nutritional status . Physical activity . Advanced directives . List of other physicians . Hospitalizations, surgeries, and ER visits in previous 12 months . Vitals . Screenings to include cognitive, depression, and falls . Referrals and appointments  In addition, I have reviewed and discussed with patient certain preventive protocols, quality metrics, and best practice recommendations. A written personalized care plan for  preventive services as well as general preventive health recommendations were provided to patient.     Willette Brace, LPN   04/17/4402   Nurse Notes: Pt was given information to contact Pharmacy regarding Tetanus for an injury a few days ago. Pt is also requesting an order for a Bone density if she is eligible at this time.Pt is requesting financial assistance with some of her Diabetic medications related to the out of pocket expense she has incurred.

## 2020-05-20 ENCOUNTER — Inpatient Hospital Stay: Payer: Medicare Other

## 2020-05-21 ENCOUNTER — Inpatient Hospital Stay: Payer: Medicare Other

## 2020-05-21 ENCOUNTER — Other Ambulatory Visit: Payer: Self-pay

## 2020-05-21 VITALS — BP 113/62 | HR 70 | Temp 98.1°F | Resp 18

## 2020-05-21 DIAGNOSIS — D500 Iron deficiency anemia secondary to blood loss (chronic): Secondary | ICD-10-CM | POA: Diagnosis not present

## 2020-05-21 DIAGNOSIS — D508 Other iron deficiency anemias: Secondary | ICD-10-CM

## 2020-05-21 DIAGNOSIS — N920 Excessive and frequent menstruation with regular cycle: Secondary | ICD-10-CM | POA: Diagnosis not present

## 2020-05-21 MED ORDER — SODIUM CHLORIDE 0.9 % IV SOLN
Freq: Once | INTRAVENOUS | Status: AC
  Filled 2020-05-21: qty 250

## 2020-05-21 MED ORDER — DIPHENHYDRAMINE HCL 25 MG PO CAPS
ORAL_CAPSULE | ORAL | Status: AC
  Filled 2020-05-21: qty 2

## 2020-05-21 MED ORDER — SODIUM CHLORIDE 0.9 % IV SOLN
200.0000 mg | Freq: Once | INTRAVENOUS | Status: AC
  Administered 2020-05-21: 200 mg via INTRAVENOUS
  Filled 2020-05-21: qty 200

## 2020-05-21 MED ORDER — ACETAMINOPHEN 325 MG PO TABS
ORAL_TABLET | ORAL | Status: AC
  Filled 2020-05-21: qty 2

## 2020-05-21 MED ORDER — DIPHENHYDRAMINE HCL 25 MG PO CAPS
50.0000 mg | ORAL_CAPSULE | Freq: Once | ORAL | Status: AC
  Administered 2020-05-21: 50 mg via ORAL

## 2020-05-21 MED ORDER — ACETAMINOPHEN 325 MG PO TABS
650.0000 mg | ORAL_TABLET | Freq: Once | ORAL | Status: AC
  Administered 2020-05-21: 650 mg via ORAL

## 2020-05-23 ENCOUNTER — Other Ambulatory Visit: Payer: Self-pay | Admitting: Internal Medicine

## 2020-05-26 ENCOUNTER — Inpatient Hospital Stay: Payer: Medicare Other

## 2020-05-26 ENCOUNTER — Other Ambulatory Visit: Payer: Self-pay

## 2020-05-26 VITALS — BP 118/55 | HR 69 | Temp 98.0°F | Resp 18

## 2020-05-26 DIAGNOSIS — D508 Other iron deficiency anemias: Secondary | ICD-10-CM

## 2020-05-26 DIAGNOSIS — N920 Excessive and frequent menstruation with regular cycle: Secondary | ICD-10-CM | POA: Diagnosis not present

## 2020-05-26 DIAGNOSIS — D500 Iron deficiency anemia secondary to blood loss (chronic): Secondary | ICD-10-CM | POA: Diagnosis not present

## 2020-05-26 MED ORDER — DIPHENHYDRAMINE HCL 25 MG PO CAPS
50.0000 mg | ORAL_CAPSULE | Freq: Once | ORAL | Status: AC
  Administered 2020-05-26: 50 mg via ORAL

## 2020-05-26 MED ORDER — SODIUM CHLORIDE 0.9 % IV SOLN
200.0000 mg | Freq: Once | INTRAVENOUS | Status: AC
  Administered 2020-05-26: 200 mg via INTRAVENOUS
  Filled 2020-05-26: qty 200

## 2020-05-26 MED ORDER — DIPHENHYDRAMINE HCL 25 MG PO CAPS
ORAL_CAPSULE | ORAL | Status: AC
  Filled 2020-05-26: qty 2

## 2020-05-26 MED ORDER — SODIUM CHLORIDE 0.9 % IV SOLN
Freq: Once | INTRAVENOUS | Status: AC
  Filled 2020-05-26: qty 250

## 2020-05-26 MED ORDER — ACETAMINOPHEN 325 MG PO TABS
650.0000 mg | ORAL_TABLET | Freq: Once | ORAL | Status: AC
  Administered 2020-05-26: 650 mg via ORAL

## 2020-05-26 MED ORDER — ACETAMINOPHEN 325 MG PO TABS
ORAL_TABLET | ORAL | Status: AC
  Filled 2020-05-26: qty 2

## 2020-05-26 NOTE — Patient Instructions (Signed)

## 2020-05-27 ENCOUNTER — Telehealth: Payer: Self-pay | Admitting: Internal Medicine

## 2020-05-27 NOTE — Telephone Encounter (Signed)
Scheduled appt per 7/16 sch msg - pt aware of appt date and time

## 2020-05-30 ENCOUNTER — Other Ambulatory Visit: Payer: Self-pay | Admitting: Internal Medicine

## 2020-06-02 ENCOUNTER — Other Ambulatory Visit: Payer: Self-pay | Admitting: *Deleted

## 2020-06-02 ENCOUNTER — Inpatient Hospital Stay: Payer: Medicare Other

## 2020-06-02 ENCOUNTER — Other Ambulatory Visit: Payer: Self-pay | Admitting: Physician Assistant

## 2020-06-02 MED ORDER — ATORVASTATIN CALCIUM 40 MG PO TABS: 40.0000 mg | ORAL_TABLET | Freq: Every day | ORAL | 3 refills | Status: DC

## 2020-06-02 NOTE — Telephone Encounter (Signed)
Last Visit: 02/10/2020  Next Visit:08/16/2020 Labs: 02/10/2020 She has had neutropenia in the past but now the white cell count is low. It is unusual to have neutropenia with colchicine although I discussed stopping colchicine and rechecking WBC count in 2 weeks. She does not want to stop colchicine.  CBC 05/10/2020 WBC 3.7, RBC 3.78, MVC 102.9  Okay to refill colchicine?

## 2020-06-09 ENCOUNTER — Inpatient Hospital Stay: Payer: Medicare Other

## 2020-06-09 ENCOUNTER — Other Ambulatory Visit: Payer: Self-pay

## 2020-06-09 VITALS — BP 103/63 | HR 66 | Temp 98.2°F | Resp 18

## 2020-06-09 DIAGNOSIS — M545 Low back pain: Secondary | ICD-10-CM | POA: Diagnosis not present

## 2020-06-09 DIAGNOSIS — N920 Excessive and frequent menstruation with regular cycle: Secondary | ICD-10-CM | POA: Diagnosis not present

## 2020-06-09 DIAGNOSIS — M79672 Pain in left foot: Secondary | ICD-10-CM | POA: Diagnosis not present

## 2020-06-09 DIAGNOSIS — M79671 Pain in right foot: Secondary | ICD-10-CM | POA: Diagnosis not present

## 2020-06-09 DIAGNOSIS — G603 Idiopathic progressive neuropathy: Secondary | ICD-10-CM | POA: Diagnosis not present

## 2020-06-09 DIAGNOSIS — R202 Paresthesia of skin: Secondary | ICD-10-CM | POA: Diagnosis not present

## 2020-06-09 DIAGNOSIS — D508 Other iron deficiency anemias: Secondary | ICD-10-CM

## 2020-06-09 DIAGNOSIS — M542 Cervicalgia: Secondary | ICD-10-CM | POA: Diagnosis not present

## 2020-06-09 DIAGNOSIS — D500 Iron deficiency anemia secondary to blood loss (chronic): Secondary | ICD-10-CM | POA: Diagnosis not present

## 2020-06-09 MED ORDER — ACETAMINOPHEN 325 MG PO TABS
ORAL_TABLET | ORAL | Status: AC
  Filled 2020-06-09: qty 2

## 2020-06-09 MED ORDER — SODIUM CHLORIDE 0.9 % IV SOLN
Freq: Once | INTRAVENOUS | Status: AC
  Filled 2020-06-09: qty 250

## 2020-06-09 MED ORDER — DIPHENHYDRAMINE HCL 25 MG PO CAPS
50.0000 mg | ORAL_CAPSULE | Freq: Once | ORAL | Status: AC
  Administered 2020-06-09: 50 mg via ORAL

## 2020-06-09 MED ORDER — DIPHENHYDRAMINE HCL 25 MG PO CAPS
ORAL_CAPSULE | ORAL | Status: AC
  Filled 2020-06-09: qty 2

## 2020-06-09 MED ORDER — ACETAMINOPHEN 325 MG PO TABS
650.0000 mg | ORAL_TABLET | Freq: Once | ORAL | Status: AC
  Administered 2020-06-09: 650 mg via ORAL

## 2020-06-09 MED ORDER — SODIUM CHLORIDE 0.9 % IV SOLN
200.0000 mg | Freq: Once | INTRAVENOUS | Status: AC
  Administered 2020-06-09: 200 mg via INTRAVENOUS
  Filled 2020-06-09: qty 200

## 2020-06-09 NOTE — Patient Instructions (Signed)

## 2020-06-14 ENCOUNTER — Ambulatory Visit: Payer: Medicare Other | Admitting: Internal Medicine

## 2020-06-14 ENCOUNTER — Other Ambulatory Visit: Payer: Medicare Other

## 2020-06-16 ENCOUNTER — Inpatient Hospital Stay: Payer: Medicare Other | Attending: Physician Assistant

## 2020-06-16 ENCOUNTER — Other Ambulatory Visit: Payer: Self-pay

## 2020-06-16 VITALS — BP 106/54 | HR 74 | Temp 98.1°F | Resp 18

## 2020-06-16 DIAGNOSIS — D508 Other iron deficiency anemias: Secondary | ICD-10-CM

## 2020-06-16 DIAGNOSIS — N920 Excessive and frequent menstruation with regular cycle: Secondary | ICD-10-CM | POA: Insufficient documentation

## 2020-06-16 DIAGNOSIS — D500 Iron deficiency anemia secondary to blood loss (chronic): Secondary | ICD-10-CM | POA: Insufficient documentation

## 2020-06-16 MED ORDER — SODIUM CHLORIDE 0.9 % IV SOLN
INTRAVENOUS | Status: DC
  Filled 2020-06-16 (×2): qty 250

## 2020-06-16 MED ORDER — DIPHENHYDRAMINE HCL 25 MG PO CAPS
ORAL_CAPSULE | ORAL | Status: AC
  Filled 2020-06-16: qty 2

## 2020-06-16 MED ORDER — SODIUM CHLORIDE 0.9 % IV SOLN
200.0000 mg | Freq: Once | INTRAVENOUS | Status: AC
  Administered 2020-06-16: 200 mg via INTRAVENOUS
  Filled 2020-06-16: qty 200

## 2020-06-16 MED ORDER — ACETAMINOPHEN 325 MG PO TABS
650.0000 mg | ORAL_TABLET | Freq: Once | ORAL | Status: AC
  Administered 2020-06-16: 650 mg via ORAL

## 2020-06-16 MED ORDER — ACETAMINOPHEN 325 MG PO TABS
ORAL_TABLET | ORAL | Status: AC
  Filled 2020-06-16: qty 2

## 2020-06-16 MED ORDER — DIPHENHYDRAMINE HCL 25 MG PO CAPS
50.0000 mg | ORAL_CAPSULE | Freq: Once | ORAL | Status: AC
  Administered 2020-06-16: 50 mg via ORAL

## 2020-06-16 MED ORDER — ACETAMINOPHEN 325 MG PO TABS
ORAL_TABLET | ORAL | Status: AC
  Filled 2020-06-16: qty 1

## 2020-06-16 NOTE — Patient Instructions (Signed)

## 2020-06-20 DIAGNOSIS — L900 Lichen sclerosus et atrophicus: Secondary | ICD-10-CM | POA: Diagnosis not present

## 2020-06-20 DIAGNOSIS — L258 Unspecified contact dermatitis due to other agents: Secondary | ICD-10-CM | POA: Diagnosis not present

## 2020-06-20 DIAGNOSIS — T148XXA Other injury of unspecified body region, initial encounter: Secondary | ICD-10-CM | POA: Diagnosis not present

## 2020-07-12 DIAGNOSIS — Z6822 Body mass index (BMI) 22.0-22.9, adult: Secondary | ICD-10-CM | POA: Diagnosis not present

## 2020-07-12 DIAGNOSIS — L900 Lichen sclerosus et atrophicus: Secondary | ICD-10-CM | POA: Diagnosis not present

## 2020-07-12 DIAGNOSIS — D4959 Neoplasm of unspecified behavior of other genitourinary organ: Secondary | ICD-10-CM | POA: Diagnosis not present

## 2020-07-12 DIAGNOSIS — Z01419 Encounter for gynecological examination (general) (routine) without abnormal findings: Secondary | ICD-10-CM | POA: Diagnosis not present

## 2020-07-12 DIAGNOSIS — Z1231 Encounter for screening mammogram for malignant neoplasm of breast: Secondary | ICD-10-CM | POA: Diagnosis not present

## 2020-07-21 DIAGNOSIS — M5412 Radiculopathy, cervical region: Secondary | ICD-10-CM | POA: Diagnosis not present

## 2020-07-21 DIAGNOSIS — M5417 Radiculopathy, lumbosacral region: Secondary | ICD-10-CM | POA: Diagnosis not present

## 2020-07-21 DIAGNOSIS — R270 Ataxia, unspecified: Secondary | ICD-10-CM | POA: Diagnosis not present

## 2020-07-21 DIAGNOSIS — G5603 Carpal tunnel syndrome, bilateral upper limbs: Secondary | ICD-10-CM | POA: Diagnosis not present

## 2020-07-21 DIAGNOSIS — Z79899 Other long term (current) drug therapy: Secondary | ICD-10-CM | POA: Diagnosis not present

## 2020-07-21 DIAGNOSIS — R202 Paresthesia of skin: Secondary | ICD-10-CM | POA: Diagnosis not present

## 2020-07-21 DIAGNOSIS — G603 Idiopathic progressive neuropathy: Secondary | ICD-10-CM | POA: Diagnosis not present

## 2020-08-02 NOTE — Progress Notes (Signed)
Office Visit Note  Patient: Savannah [name]             Date of Birth: 01/31/1956           MRN: [id number]             PCP: Vivi Barrack, MD Referring: Vivi Barrack, MD Visit Date: 08/16/2020 Occupation: @GUAROCC @  Subjective:  Right hip pain   History of Present Illness: Savannah [name] is a 64 y.o. female with history of osteoarthritis, and pseudogout.  She states she has not had a major flare of pseudogout.  She usually takes Medicare for flares.  She states most of flares, in her left wrist.  She continues to have some discomfort in her joints due to underlying osteoarthritis.  She states she had a fracture of her left ankle joint last year.  She has some leg length shortening and has difficulty walking.  She states she tried physical therapy earlier this year which did not work very well for her.  She has been having pain and discomfort in her right trochanteric bursa.  She also has poor balance and weaker muscles in her lower extremities.  She would like to go for physical therapy.  Right total knee replacement is doing well.  Activities of Daily Living:  Patient reports morning stiffness for 3 minutes.   Patient Reports nocturnal pain.  Difficulty dressing/grooming: Denies Difficulty climbing stairs: Reports Difficulty getting out of chair: Reports Difficulty using hands for taps, buttons, cutlery, and/or writing: Reports  Review of Systems  Constitutional: Positive for fatigue.  HENT: Positive for mouth dryness.   Eyes: Positive for dryness.  Respiratory: Negative for shortness of breath.   Cardiovascular: Positive for swelling in legs/feet.  Gastrointestinal: Positive for constipation and diarrhea.  Endocrine: Positive for heat intolerance.  Genitourinary: Negative for difficulty urinating.  Musculoskeletal: Positive for arthralgias, gait problem, joint pain, muscle weakness and morning stiffness. Negative for joint swelling.  Skin: Positive for rash.    Allergic/Immunologic: Negative for susceptible to infections.  Neurological: Positive for numbness and weakness.  Hematological: Positive for bruising/bleeding tendency.  Psychiatric/Behavioral: Negative for sleep disturbance.    PMFS History:  Patient Active Problem List   Diagnosis Date Noted  . Iron deficiency anemia 03/14/2020  . Neutropenia ([place]) 02/29/2020  . Deficiency anemia 02/29/2020  . Breast lump 09/02/2019  . Lesion of vulva 09/02/2019  . Menorrhagia 09/02/2019  . Neuropathy 09/02/2019  . Abdominal pain, epigastric 06/25/2019  . Irregular bowel habits 06/25/2019  . Coronary artery disease involving native coronary artery of native heart without angina pectoris   . Temporomandibular joint disorder 12/05/2018  . Fracture of upper limb 12/05/2018  . Fatigue 12/05/2018  . Inverted nipple 12/05/2018  . Senile purpura ([place]) 11/21/2018  . GERD with esophagitis 11/21/2018  . Pseudogout involving multiple joints 11/21/2018  . Lipoma 07/22/2017  . Lichen sclerosus 63/87/5643  . H/O gastric bypass 11/16/2016  . GERD (gastroesophageal reflux disease) 11/16/2016  . Depression 11/16/2016  . Diabetic polyneuropathy associated with type 2 diabetes mellitus ([place]) 11/16/2016  . Hyperlipidemia 11/16/2016  . Left lower quadrant pain 11/16/2016  . Uterine cancer [facility]) s/p hysterectomy 1997 01/04/2012  . Postsurgical hypothyroidism 09/19/2010  . Generalized abdominal pain 10/20/2009  . Diabetic neuropathy ([place]) 11/21/2007  . Allergic rhinitis 11/21/2007  . Type 2 diabetes mellitus with diabetic neuropathy, unspecified ([place]) 07/08/2007  . Dyslipidemia associated with type 2 diabetes mellitus ([place]) 07/08/2007    Past Medical History:  Diagnosis Date  .  ABDOMINAL PAIN, CHRONIC 10/20/2009  . Acute cystitis 08/17/2009  . ALLERGIC RHINITIS 11/21/2007  . Allergy   . ASYMPTOMATIC POSTMENOPAUSAL STATUS 09/29/2008  . Bariatric surgery status 07/07/2010  . Bariatric surgery status  07/07/2010   Qualifier: Diagnosis of  By: Loanne Drilling MD, Jacelyn Pi   . Cancer ([place])    uterine  . Cataract   . Coronary artery disease   . DEPRESSION 11/21/2007  . DIABETES MELLITUS, TYPE II 07/08/2007  . DYSPNEA 05/20/2009  . Edema 05/20/2009  . Eosinophilic esophagitis 11/14/2438  . FEVER UNSPECIFIED 10/20/2009  . FEVER, HX OF 03/09/2010  . GERD (gastroesophageal reflux disease)   . Headache(784.0) 02/06/2010  . Hepatomegaly 01/06/2008  . HYPERLIPIDEMIA 07/08/2007  . HYPERTENSION 07/08/2007  . LEUKOPENIA, MILD 09/29/2008  . OBESITY 01/06/2008  . OSTEOARTHRITIS 01/06/2008  . Other chronic nonalcoholic liver disease 11/14/7251  . PERIPHERAL NEUROPATHY 11/21/2007  . Peripheral neuropathy   . Postsurgical hypothyroidism 09/19/2010  . SINUSITIS- ACUTE-NOS 11/21/2007  . TB SKIN TEST, POSITIVE 02/06/2010  . THYROID NODULE 03/09/2010    Family History  Problem Relation Age of Onset  . Multiple sclerosis Sister   . Breast cancer Sister        breast onset age 84  . Osteoporosis Sister   . Diabetes Mother   . Diabetes Father   . Congestive Heart Failure Father   . Hypertension Father   . Myelodysplastic syndrome Father   . Liver cancer Maternal Aunt   . Breast cancer Maternal Aunt   . Diabetes Maternal Aunt   . Colon cancer Maternal Uncle   . Diabetes Maternal Uncle   . Diabetes Maternal Grandmother   . Diabetes Paternal Grandmother    Past Surgical History:  Procedure Laterality Date  . ABDOMINAL HYSTERECTOMY    . BASAL CELL CARCINOMA EXCISION    . CARDIAC CATHETERIZATION    . COLONOSCOPY    . EYE SURGERY Bilateral 08/2018, 09/2018  . FOOT SURGERY Left    10/2019  . KNEE ARTHROSCOPY Left   . LEFT HEART CATH AND CORONARY ANGIOGRAPHY N/A 01/20/2019   Procedure: LEFT HEART CATH AND CORONARY ANGIOGRAPHY;  Surgeon: Belva Crome, MD;  Location: [place] [facility];  Service: Cardiovascular;  Laterality: N/A;  . LYMPH NODE DISSECTION    . OOPHORECTOMY    . Right total knee replacement    . ROUX-EN-Y  GASTRIC BYPASS  2011  . SKIN TAG REMOVAL    . THYROIDECTOMY    . TONSILLECTOMY AND ADENOIDECTOMY    . UPPER GASTROINTESTINAL ENDOSCOPY     Social History   Social History Narrative  . Not on file   Immunization History  Administered Date(s) Administered  . Fluad Quad(high Dose 65+) 07/13/2019  . Influenza Inj Mdck Quad Pf 10/19/2017, 07/23/2018  . Influenza Whole 07/30/2008, 08/17/2009, 09/19/2010  . Influenza,inj,Quad PF,6-35 Mos 07/14/2015  . Influenza-Unspecified 10/13/2011, 08/12/2013, 07/13/2017, 07/23/2018  . PFIZER SARS-COV-2 Vaccination 12/21/2019, 01/14/2020  . Pneumococcal Conjugate-13 10/14/2015  . Pneumococcal Polysaccharide-23 06/25/2014  . Zoster 06/25/2014     Objective: Vital Signs: BP 105/62 (BP Location: Left Arm, Patient Position: Sitting, Cuff Size: Normal)   Pulse 72   Resp 14   Ht 5' 11"  (1.803 m)   Wt 170 lb 3.2 oz (77.2 kg)   BMI 23.74 kg/m    Physical Exam Vitals and nursing note reviewed.  Constitutional:      Appearance: She is well-developed.  HENT:     Head: Normocephalic and atraumatic.  Eyes:  Conjunctiva/sclera: Conjunctivae normal.  Cardiovascular:     Rate and Rhythm: Normal rate and regular rhythm.     Heart sounds: Normal heart sounds.  Pulmonary:     Effort: Pulmonary effort is normal.     Breath sounds: Normal breath sounds.  Abdominal:     General: Bowel sounds are normal.     Palpations: Abdomen is soft.  Musculoskeletal:     Cervical back: Normal range of motion.     Right lower leg: Edema present.     Left lower leg: Edema present.  Lymphadenopathy:     Cervical: No cervical adenopathy.  Skin:    General: Skin is warm and dry.     Capillary Refill: Capillary refill takes less than 2 seconds.  Neurological:     Mental Status: She is alert and oriented to person, place, and time.  Psychiatric:        Behavior: Behavior normal.      Musculoskeletal Exam: C-spine was in good range of motion.  Shoulder joints,  elbow joints with good range of motion.  She has bilateral PIP and DIP thickening with no synovitis.  She has some limitation range of motion of her right hip joint.  She had tenderness over right trochanteric bursa.  Right knee joint is replaced.  She had good range of motion of her left knee joint without any warmth swelling or effusion.  She had bilateral pedal edema.  No tenderness was noted over her joints.  CDAI Exam: CDAI Score: -- Patient Global: --; Provider Global: -- Swollen: --; Tender: -- Joint Exam 08/16/2020   No joint exam has been documented for this visit   There is currently no information documented on the homunculus. Go to the Rheumatology activity and complete the homunculus joint exam.  Investigation: No additional findings.  Imaging: No results found.  Recent Labs: Lab Results  Component Value Date   WBC 4.0 08/10/2020   HGB 13.0 08/10/2020   PLT 189 08/10/2020   NA 139 02/29/2020   K 4.1 02/29/2020   CL 111 02/29/2020   CO2 21 (L) 02/29/2020   GLUCOSE 300 (H) 02/29/2020   BUN 12 02/29/2020   CREATININE 1.06 (H) 02/29/2020   BILITOT 0.4 02/29/2020   ALKPHOS 125 02/29/2020   AST 25 02/29/2020   ALT 37 02/29/2020   PROT 5.9 (L) 02/29/2020   ALBUMIN 3.3 (L) 02/29/2020   CALCIUM 8.3 (L) 02/29/2020   GFRAA >60 02/29/2020    Speciality Comments: No specialty comments available.  Procedures:  No procedures performed Allergies: Ace inhibitors, Caffeine, Ciprofloxacin, Morphine and related, Mucinex [guaifenesin er], Nsaids, Other, and Silicone   Assessment / Plan:     Visit Diagnoses: Inflammatory arthritis - RF-, anti-CCP-, 14-3-3 eta-, ANA-, HLAB27-, uric acid 3.8.  Patient may have pseudogout.  She states she gets intermittent left wrist joint swelling with response to Medicare.  Medication monitoring encounter - Plan: COMPLETE METABOLIC PANEL WITH GFR.  Her last labs showed elevated creatinine.  We will check labs again today.  Primary  osteoarthritis of both hands-joint protection muscle strengthening was discussed.  Trochanteric bursitis of both hips -she has been having discomfort in her right trochanteric bursa.  She believes it is due to leg length discrepancy.  She had physical therapy earlier this year which was not very effective.  Will refer to PT again.  I have given her IT band exercises.  Plan: Ambulatory referral to Physical Therapy  Primary osteoarthritis of left knee -she has been having  some discomfort in her left knee joint.  She states her balance is not very good.  Plan: Ambulatory referral to Physical Therapy  Status post total right knee replacement-doing well.  Primary osteoarthritis of both feet - She has been seeing Dr. Gershon Mussel, and she has had repeated cortisone injections (7+ injections according to the patient).   Poor balance -she has poor balance palpation.  Plan: Ambulatory referral to Physical Therapy  Status post gastric bypass for obesity  History of gastroesophageal reflux (GERD)  History of diabetes mellitus, type II  Eosinophilic esophagitis  Positive PPD  History of peripheral neuropathy  History of uterine cancer  Postsurgical hypothyroidism  NASH (nonalcoholic steatohepatitis)  History of hyperlipidemia  Osteoporosis screening-patient states she fractured her left ankle last year.  I see that she has orders placed in the computer by her PCP for DEXA scan.  Have advised her to find out where she can go for bone density.  Educated about COVID-19 virus infection-she is fully vaccinated against COVID-19.  I have advised her to get a booster as she is over 65.  Use of mask, social distancing and hand hygiene was discussed.  Orders: Orders Placed This Encounter  Procedures  . COMPLETE METABOLIC PANEL WITH GFR  . Ambulatory referral to Physical Therapy   No orders of the defined types were placed in this encounter.    Follow-Up Instructions: Return in about 6 months (around  02/14/2021) for CPPD, OA.   Bo Merino, MD  Note - This record has been created using Editor, commissioning.  Chart creation errors have been sought, but may not always  have been located. Such creation errors do not reflect on  the standard of medical care.

## 2020-08-09 ENCOUNTER — Telehealth: Payer: Self-pay | Admitting: Internal Medicine

## 2020-08-09 ENCOUNTER — Inpatient Hospital Stay: Payer: Medicare Other | Admitting: Internal Medicine

## 2020-08-09 ENCOUNTER — Inpatient Hospital Stay: Payer: Medicare Other

## 2020-08-09 NOTE — Telephone Encounter (Signed)
R/s todays appt due to provider being on call. Called and spoke with patient. Confirmed new appt

## 2020-08-10 ENCOUNTER — Other Ambulatory Visit: Payer: Self-pay

## 2020-08-10 ENCOUNTER — Inpatient Hospital Stay: Payer: Medicare Other | Attending: Physician Assistant

## 2020-08-10 ENCOUNTER — Encounter: Payer: Self-pay | Admitting: Internal Medicine

## 2020-08-10 ENCOUNTER — Inpatient Hospital Stay (HOSPITAL_BASED_OUTPATIENT_CLINIC_OR_DEPARTMENT_OTHER): Payer: Medicare Other | Admitting: Internal Medicine

## 2020-08-10 VITALS — BP 104/64 | HR 88 | Temp 96.6°F | Resp 18 | Ht 71.0 in | Wt 167.4 lb

## 2020-08-10 DIAGNOSIS — N920 Excessive and frequent menstruation with regular cycle: Secondary | ICD-10-CM | POA: Diagnosis not present

## 2020-08-10 DIAGNOSIS — D500 Iron deficiency anemia secondary to blood loss (chronic): Secondary | ICD-10-CM | POA: Diagnosis not present

## 2020-08-10 DIAGNOSIS — Z7984 Long term (current) use of oral hypoglycemic drugs: Secondary | ICD-10-CM | POA: Diagnosis not present

## 2020-08-10 DIAGNOSIS — D72819 Decreased white blood cell count, unspecified: Secondary | ICD-10-CM | POA: Diagnosis not present

## 2020-08-10 DIAGNOSIS — Z79899 Other long term (current) drug therapy: Secondary | ICD-10-CM | POA: Insufficient documentation

## 2020-08-10 DIAGNOSIS — I2510 Atherosclerotic heart disease of native coronary artery without angina pectoris: Secondary | ICD-10-CM

## 2020-08-10 DIAGNOSIS — D539 Nutritional anemia, unspecified: Secondary | ICD-10-CM | POA: Diagnosis not present

## 2020-08-10 DIAGNOSIS — Z791 Long term (current) use of non-steroidal anti-inflammatories (NSAID): Secondary | ICD-10-CM | POA: Insufficient documentation

## 2020-08-10 DIAGNOSIS — D649 Anemia, unspecified: Secondary | ICD-10-CM | POA: Insufficient documentation

## 2020-08-10 DIAGNOSIS — D509 Iron deficiency anemia, unspecified: Secondary | ICD-10-CM | POA: Diagnosis not present

## 2020-08-10 DIAGNOSIS — D508 Other iron deficiency anemias: Secondary | ICD-10-CM

## 2020-08-10 DIAGNOSIS — D696 Thrombocytopenia, unspecified: Secondary | ICD-10-CM | POA: Insufficient documentation

## 2020-08-10 LAB — CBC WITH DIFFERENTIAL (CANCER CENTER ONLY)
Abs Immature Granulocytes: 0.01 10*3/uL (ref 0.00–0.07)
Basophils Absolute: 0 10*3/uL (ref 0.0–0.1)
Basophils Relative: 1 %
Eosinophils Absolute: 0.2 10*3/uL (ref 0.0–0.5)
Eosinophils Relative: 4 %
HCT: 40.6 % (ref 36.0–46.0)
Hemoglobin: 13 g/dL (ref 12.0–15.0)
Immature Granulocytes: 0 %
Lymphocytes Relative: 27 %
Lymphs Abs: 1.1 10*3/uL (ref 0.7–4.0)
MCH: 32.5 pg (ref 26.0–34.0)
MCHC: 32 g/dL (ref 30.0–36.0)
MCV: 101.5 fL — ABNORMAL HIGH (ref 80.0–100.0)
Monocytes Absolute: 0.3 10*3/uL (ref 0.1–1.0)
Monocytes Relative: 8 %
Neutro Abs: 2.4 10*3/uL (ref 1.7–7.7)
Neutrophils Relative %: 60 %
Platelet Count: 189 10*3/uL (ref 150–400)
RBC: 4 MIL/uL (ref 3.87–5.11)
RDW: 13.3 % (ref 11.5–15.5)
WBC Count: 4 10*3/uL (ref 4.0–10.5)
nRBC: 0 % (ref 0.0–0.2)

## 2020-08-10 LAB — IRON AND TIBC
Iron: 102 ug/dL (ref 41–142)
Saturation Ratios: 34 % (ref 21–57)
TIBC: 298 ug/dL (ref 236–444)
UIBC: 195 ug/dL (ref 120–384)

## 2020-08-10 LAB — FERRITIN: Ferritin: 336 ng/mL — ABNORMAL HIGH (ref 11–307)

## 2020-08-10 NOTE — Progress Notes (Signed)
[facility] [phone]   [phone]  OFFICE PROGRESS NOTE  [address], [place] [address]  DIAGNOSIS: Anemia and leukocytopenia and occasional thrombocytopenia.  PRIOR THERAPY: None  CURRENT THERAPY:  Venofer infusion weekly status post 9 doses.  INTERVAL HISTORY: Savannah [name] 64 y.o. female returns to the clinic today for follow-up visit.  The patient is feeling fine today with no concerning complaints except for mild fatigue.  She denied having any chest pain, shortness of breath, cough or hemoptysis.  She denied having any fever or chills.  She has no nausea, vomiting, diarrhea or constipation.  She continues to have few areas of ecchymosis in her upper extremities.  She completed 9 doses of Venofer infusion.  She felt a little bit better after the iron infusion.  She is here today for evaluation with repeat CBC, iron study and ferritin as well as B12 level and basic metabolic panel.  MEDICAL HISTORY: Past Medical History:  Diagnosis Date  . ABDOMINAL PAIN, CHRONIC 10/20/2009  . Acute cystitis 08/17/2009  . ALLERGIC RHINITIS 11/21/2007  . Allergy   . ASYMPTOMATIC POSTMENOPAUSAL STATUS 09/29/2008  . Bariatric surgery status 07/07/2010  . Bariatric surgery status 07/07/2010   Qualifier: Diagnosis of  By: Loanne Drilling MD, Jacelyn Pi   . Cancer ([place])    uterine  . Cataract   . Coronary artery disease   . DEPRESSION 11/21/2007  . DIABETES MELLITUS, TYPE II 07/08/2007  . DYSPNEA 05/20/2009  . Edema 05/20/2009  . Eosinophilic esophagitis 06/12/2750  . FEVER UNSPECIFIED 10/20/2009  . FEVER, HX OF 03/09/2010  . GERD (gastroesophageal reflux disease)   . Headache(784.0) 02/06/2010  . Hepatomegaly 01/06/2008  . HYPERLIPIDEMIA 07/08/2007  . HYPERTENSION 07/08/2007  . LEUKOPENIA, MILD 09/29/2008  . OBESITY 01/06/2008  . OSTEOARTHRITIS 01/06/2008  . Other chronic nonalcoholic liver disease 7/00/1749  . PERIPHERAL NEUROPATHY 11/21/2007  .  Peripheral neuropathy   . Postsurgical hypothyroidism 09/19/2010  . SINUSITIS- ACUTE-NOS 11/21/2007  . TB SKIN TEST, POSITIVE 02/06/2010  . THYROID NODULE 03/09/2010    ALLERGIES:  is allergic to ace inhibitors, caffeine, ciprofloxacin, morphine and related, mucinex [guaifenesin er], nsaids, other, and silicone.  MEDICATIONS:  Current Outpatient Medications  Medication Sig Dispense Refill  . amoxicillin (AMOXIL) 500 MG capsule TAKE 4 CAPSULES BY MOUTH ONE HOUR BEFORE DENTAL APPT.    [name] aspirin EC 81 MG tablet Take 1 tablet (81 mg total) by mouth daily. Starting 01/18/19 prior to cardiac cath    . atorvastatin (LIPITOR) 40 MG tablet Take 1 tablet (40 mg total) by mouth daily. 90 tablet 3  . canagliflozin (INVOKANA) 300 MG TABS tablet Take 1 tablet (300 mg total) by mouth daily before breakfast. 90 tablet 3  . Cholecalciferol (VITAMIN D3) 125 MCG (5000 UT) CAPS Take 5,000 Units by mouth daily.    . cyclobenzaprine (FLEXERIL) 10 MG tablet Take 10 mg by mouth at bedtime.    [name] desloratadine (CLARINEX) 5 MG tablet Take 5 mg by mouth daily.    . diclofenac sodium (VOLTAREN) 1 % GEL Apply 4 g topically 4 (four) times daily. (Patient taking differently: Apply 4 g topically as needed (pain). ) 100 g 11  . docusate sodium (COLACE) 100 MG capsule Take 200 mg by mouth 2 (two) times daily.    [name] gabapentin (NEURONTIN) 300 MG capsule Take 3 capsules (900 mg total) by mouth 3 (three) times daily. 360 capsule 2  . halobetasol (ULTRAVATE) 0.05 %  cream Apply 1 application topically 2 (two) times daily as needed (lichen sclerosus).   2  . HYDROcodone-acetaminophen (NORCO/VICODIN) 5-325 MG tablet Take 1 tablet by mouth daily as needed for moderate pain or severe pain.    [name] levothyroxine (SYNTHROID) 112 MCG tablet TAKE 1 TABLET BY MOUTH EVERY DAY (Patient taking differently: Take 112 mcg by mouth daily before breakfast. ) 90 tablet 3  . MAGNESIUM PO Take 1 tablet by mouth daily as needed (cramps).     . metFORMIN  (GLUCOPHAGE-XR) 500 MG 24 hr tablet Take 4 tablets (2,000 mg total) by mouth daily. 360 tablet 3  . MITIGARE 0.6 MG CAPS TAKE 1 CAPSULE BY MOUTH EVERY DAY 90 capsule 0  . Multiple Vitamin (MULTIVITAMIN) tablet Take 2 tablets by mouth daily.     . nitroGLYCERIN (NITROSTAT) 0.4 MG SL tablet Place 1 tablet (0.4 mg total) under the tongue every 5 (five) minutes as needed. 25 tablet 3  . ONETOUCH ULTRA test strip USE TO MONITOR GLUCOSE LEVELS ONCE PER DAY E11.40 100 strip 2  . oxyCODONE-acetaminophen (PERCOCET) 5-325 MG tablet Take 1 tablet by mouth every 6 (six) hours as needed for severe pain. 30 tablet 0  . oxyCODONE-acetaminophen (PERCOCET) 7.5-325 MG tablet Take 1 tablet by mouth daily as needed.    . pantoprazole (PROTONIX) 40 MG tablet TAKE 1 TABLET BY MOUTH TWICE A DAY 180 tablet 1  . phenazopyridine (PYRIDIUM) 100 MG tablet Take 100 mg by mouth 3 (three) times daily as needed for pain.    . Probiotic CAPS Take 1 capsule by mouth daily.    . repaglinide (PRANDIN) 2 MG tablet Take 1 tablet (2 mg total) by mouth 2 (two) times daily before a meal. 180 tablet 3  . traMADol (ULTRAM) 50 MG tablet Take 50 mg by mouth 2 (two) times daily as needed for moderate pain.    . TRULICITY 1.5 ZO/1.0RU SOPN Inject 1.5 mg into the skin once a week.    . furosemide (LASIX) 20 MG tablet Take 1 tablet (20 mg total) by mouth daily. (Patient taking differently: Take 20 mg by mouth daily as needed for fluid. ) 90 tablet 3   No current facility-administered medications for this visit.    SURGICAL HISTORY:  Past Surgical History:  Procedure Laterality Date  . ABDOMINAL HYSTERECTOMY    . BASAL CELL CARCINOMA EXCISION    . CARDIAC CATHETERIZATION    . COLONOSCOPY    . EYE SURGERY Bilateral 08/2018, 09/2018  . FOOT SURGERY Left    10/2019  . KNEE ARTHROSCOPY Left   . LEFT HEART CATH AND CORONARY ANGIOGRAPHY N/A 01/20/2019   Procedure: LEFT HEART CATH AND CORONARY ANGIOGRAPHY;  Surgeon: Belva Crome, MD;   Location: [facility];  Service: Cardiovascular;  Laterality: N/A;  . LYMPH NODE DISSECTION    . OOPHORECTOMY    . Right total knee replacement    . ROUX-EN-Y GASTRIC BYPASS  2011  . SKIN TAG REMOVAL    . THYROIDECTOMY    . TONSILLECTOMY AND ADENOIDECTOMY    . UPPER GASTROINTESTINAL ENDOSCOPY      REVIEW OF SYSTEMS:  A comprehensive review of systems was negative except for: Constitutional: positive for fatigue   PHYSICAL EXAMINATION: General appearance: alert, cooperative and no distress Head: Normocephalic, without obvious abnormality, atraumatic Neck: no adenopathy, no JVD, supple, symmetrical, trachea midline and thyroid not enlarged, symmetric, no tenderness/mass/nodules Lymph nodes: Cervical, supraclavicular, and axillary nodes normal. Resp: clear to auscultation bilaterally Back: symmetric,  no curvature. ROM normal. No CVA tenderness. Cardio: regular rate and rhythm, S1, S2 normal, no murmur, click, rub or gallop GI: soft, non-tender; bowel sounds normal; no masses,  no organomegaly Extremities: extremities normal, atraumatic, no cyanosis or edema  ECOG PERFORMANCE STATUS: 1 - Symptomatic but completely ambulatory  Blood pressure 104/64, pulse 88, temperature (!) 96.6 F (35.9 C), temperature source Tympanic, resp. rate 18, height 5' 11"  (1.803 m), weight 167 lb 7 oz (75.9 kg), SpO2 100 %.  LABORATORY DATA: Lab Results  Component Value Date   WBC 4.0 08/10/2020   HGB 13.0 08/10/2020   HCT 40.6 08/10/2020   MCV 101.5 (H) 08/10/2020   PLT 189 08/10/2020      Chemistry      Component Value Date/Time   NA 139 02/29/2020 1303   NA 144 07/06/2019 1524   K 4.1 02/29/2020 1303   CL 111 02/29/2020 1303   CO2 21 (L) 02/29/2020 1303   BUN 12 02/29/2020 1303   BUN 22 07/06/2019 1524   CREATININE 1.06 (H) 02/29/2020 1303   CREATININE 1.06 (H) 02/10/2020 1157      Component Value Date/Time   CALCIUM 8.3 (L) 02/29/2020 1303   CALCIUM 8.9 01/30/2013 1017   ALKPHOS  125 02/29/2020 1303   AST 25 02/29/2020 1303   ALT 37 02/29/2020 1303   BILITOT 0.4 02/29/2020 1303       RADIOGRAPHIC STUDIES: No results found.  ASSESSMENT AND PLAN: This is a 64 years old white female with history of iron deficiency anemia secondary to menorrhagia.  The patient started treatment with iron infusion with Venofer for four doses. She continues to have persistent fatigue but repeat CBC and iron study as well as ferritin today showed significant improvement in her condition with normalization of her hemoglobin and hematocrit as well as the serum iron and ferritin. She was treated with Venofer infusion for 9 doses.  The patient is feeling a little bit better than before. Repeat CBC showed improvement in her hemoglobin and hematocrit.  The iron study and ferritin as well as B12 level and basic metabolic panels are still pending. I recommended for the patient to continue on observation with repeat CBC, iron study and ferritin in 3 months.  We will call the patient for any additional recommendation if she has persistent iron deficiency on the pending blood work. She was advised to call immediately if she has any worsening symptoms in the interval. The patient voices understanding of current disease status and treatment options and is in agreement with the current care plan.  All questions were answered. The patient knows to call the clinic with any problems, questions or concerns. We can certainly see the patient much sooner if necessary.  Disclaimer: This note was dictated with voice recognition software. Similar sounding words can inadvertently be transcribed and may not be corrected upon review.

## 2020-08-11 ENCOUNTER — Encounter: Payer: Self-pay | Admitting: Internal Medicine

## 2020-08-11 DIAGNOSIS — R922 Inconclusive mammogram: Secondary | ICD-10-CM | POA: Diagnosis not present

## 2020-08-12 ENCOUNTER — Other Ambulatory Visit: Payer: Self-pay | Admitting: Medical Oncology

## 2020-08-12 DIAGNOSIS — D539 Nutritional anemia, unspecified: Secondary | ICD-10-CM

## 2020-08-16 ENCOUNTER — Other Ambulatory Visit: Payer: Self-pay

## 2020-08-16 ENCOUNTER — Encounter: Payer: Self-pay | Admitting: Rheumatology

## 2020-08-16 ENCOUNTER — Ambulatory Visit (INDEPENDENT_AMBULATORY_CARE_PROVIDER_SITE_OTHER): Payer: Medicare Other | Admitting: Rheumatology

## 2020-08-16 VITALS — BP 105/62 | HR 72 | Resp 14 | Ht 71.0 in | Wt 170.2 lb

## 2020-08-16 DIAGNOSIS — I2510 Atherosclerotic heart disease of native coronary artery without angina pectoris: Secondary | ICD-10-CM

## 2020-08-16 DIAGNOSIS — Z9884 Bariatric surgery status: Secondary | ICD-10-CM | POA: Diagnosis not present

## 2020-08-16 DIAGNOSIS — Z13820 Encounter for screening for osteoporosis: Secondary | ICD-10-CM

## 2020-08-16 DIAGNOSIS — Z96651 Presence of right artificial knee joint: Secondary | ICD-10-CM | POA: Diagnosis not present

## 2020-08-16 DIAGNOSIS — M19071 Primary osteoarthritis, right ankle and foot: Secondary | ICD-10-CM

## 2020-08-16 DIAGNOSIS — M1712 Unilateral primary osteoarthritis, left knee: Secondary | ICD-10-CM

## 2020-08-16 DIAGNOSIS — M19042 Primary osteoarthritis, left hand: Secondary | ICD-10-CM

## 2020-08-16 DIAGNOSIS — M19041 Primary osteoarthritis, right hand: Secondary | ICD-10-CM | POA: Diagnosis not present

## 2020-08-16 DIAGNOSIS — Z8719 Personal history of other diseases of the digestive system: Secondary | ICD-10-CM | POA: Diagnosis not present

## 2020-08-16 DIAGNOSIS — R7611 Nonspecific reaction to tuberculin skin test without active tuberculosis: Secondary | ICD-10-CM

## 2020-08-16 DIAGNOSIS — R2689 Other abnormalities of gait and mobility: Secondary | ICD-10-CM | POA: Diagnosis not present

## 2020-08-16 DIAGNOSIS — M1990 Unspecified osteoarthritis, unspecified site: Secondary | ICD-10-CM | POA: Diagnosis not present

## 2020-08-16 DIAGNOSIS — Z7189 Other specified counseling: Secondary | ICD-10-CM

## 2020-08-16 DIAGNOSIS — M1380 Other specified arthritis, unspecified site: Secondary | ICD-10-CM

## 2020-08-16 DIAGNOSIS — Z5181 Encounter for therapeutic drug level monitoring: Secondary | ICD-10-CM

## 2020-08-16 DIAGNOSIS — Z8639 Personal history of other endocrine, nutritional and metabolic disease: Secondary | ICD-10-CM | POA: Diagnosis not present

## 2020-08-16 DIAGNOSIS — K7581 Nonalcoholic steatohepatitis (NASH): Secondary | ICD-10-CM

## 2020-08-16 DIAGNOSIS — K200 Eosinophilic esophagitis: Secondary | ICD-10-CM | POA: Diagnosis not present

## 2020-08-16 DIAGNOSIS — M19072 Primary osteoarthritis, left ankle and foot: Secondary | ICD-10-CM

## 2020-08-16 DIAGNOSIS — M7061 Trochanteric bursitis, right hip: Secondary | ICD-10-CM

## 2020-08-16 DIAGNOSIS — M7062 Trochanteric bursitis, left hip: Secondary | ICD-10-CM

## 2020-08-16 DIAGNOSIS — E890 Postprocedural hypothyroidism: Secondary | ICD-10-CM

## 2020-08-16 DIAGNOSIS — Z8669 Personal history of other diseases of the nervous system and sense organs: Secondary | ICD-10-CM

## 2020-08-16 DIAGNOSIS — Z8542 Personal history of malignant neoplasm of other parts of uterus: Secondary | ICD-10-CM

## 2020-08-16 NOTE — Patient Instructions (Signed)
Iliotibial Band Syndrome Rehab Ask your health care provider which exercises are safe for you. Do exercises exactly as told by your health care provider and adjust them as directed. It is normal to feel mild stretching, pulling, tightness, or discomfort as you do these exercises. Stop right away if you feel sudden pain or your pain gets significantly worse. Do not begin these exercises until told by your health care provider. Stretching and range-of-motion exercises These exercises warm up your muscles and joints and improve the movement and flexibility of your hip and pelvis. Quadriceps stretch, prone  1. Lie on your abdomen on a firm surface, such as a bed or padded floor (prone position). 2. Bend your left / right knee and reach back to hold your ankle or pant leg. If you cannot reach your ankle or pant leg, loop a belt around your foot and grab the belt instead. 3. Gently pull your heel toward your buttocks. Your knee should not slide out to the side. You should feel a stretch in the front of your thigh and knee (quadriceps). 4. Hold this position for __________ seconds. Repeat __________ times. Complete this exercise __________ times a day. Iliotibial band stretch An iliotibial band is a strong band of muscle tissue that runs from the outer side of your hip to the outer side of your thigh and knee. 1. Lie on your side with your left / right leg in the top position. 2. Bend both of your knees and grab your left / right ankle. Stretch out your bottom arm to help you balance. 3. Slowly bring your top knee back so your thigh goes behind your trunk. 4. Slowly lower your top leg toward the floor until you feel a gentle stretch on the outside of your left / right hip and thigh. If you do not feel a stretch and your knee will not fall farther, place the heel of your other foot on top of your knee and pull your knee down toward the floor with your foot. 5. Hold this position for __________  seconds. Repeat __________ times. Complete this exercise __________ times a day. Strengthening exercises These exercises build strength and endurance in your hip and pelvis. Endurance is the ability to use your muscles for a long time, even after they get tired. Straight leg raises, side-lying This exercise strengthens the muscles that rotate the leg at the hip and move it away from your body (hip abductors). 1. Lie on your side with your left / right leg in the top position. Lie so your head, shoulder, hip, and knee line up. You may bend your bottom knee to help you balance. 2. Roll your hips slightly forward so your hips are stacked directly over each other and your left / right knee is facing forward. 3. Tense the muscles in your outer thigh and lift your top leg 4-6 inches (10-15 cm). 4. Hold this position for __________ seconds. 5. Slowly return to the starting position. Let your muscles relax completely before doing another repetition. Repeat __________ times. Complete this exercise __________ times a day. Leg raises, prone This exercise strengthens the muscles that move the hips (hip extensors). 1. Lie on your abdomen on your bed or a firm surface. You can put a pillow under your hips if that is more comfortable for your lower back. 2. Bend your left / right knee so your foot is straight up in the air. 3. Squeeze your buttocks muscles and lift your left / right thigh  off the bed. Do not let your back arch. 4. Tense your thigh muscle as hard as you can without increasing any knee pain. 5. Hold this position for __________ seconds. 6. Slowly lower your leg to the starting position and allow it to relax completely. Repeat __________ times. Complete this exercise __________ times a day. Hip hike 1. Stand sideways on a bottom step. Stand on your left / right leg with your other foot unsupported next to the step. You can hold on to the railing or wall for balance if needed. 2. Keep your knees  straight and your torso square. Then lift your left / right hip up toward the ceiling. 3. Slowly let your left / right hip lower toward the floor, past the starting position. Your foot should get closer to the floor. Do not lean or bend your knees. Repeat __________ times. Complete this exercise __________ times a day. This information is not intended to replace advice given to you by your health care provider. Make sure you discuss any questions you have with your health care provider. Document Revised: 02/19/2019 Document Reviewed: 08/20/2018 Elsevier Patient Education  [place].

## 2020-08-17 LAB — COMPLETE METABOLIC PANEL WITH GFR
AG Ratio: 2.2 (calc) (ref 1.0–2.5)
ALT: 42 U/L — ABNORMAL HIGH (ref 6–29)
AST: 27 U/L (ref 10–35)
Albumin: 3.8 g/dL (ref 3.6–5.1)
Alkaline phosphatase (APISO): 96 U/L (ref 37–153)
BUN: 24 mg/dL (ref 7–25)
CO2: 26 mmol/L (ref 20–32)
Calcium: 8.8 mg/dL (ref 8.6–10.4)
Chloride: 108 mmol/L (ref 98–110)
Creat: 0.94 mg/dL (ref 0.50–0.99)
GFR, Est African American: 73 mL/min/{1.73_m2} (ref >=60)
GFR, Est Non African American: 63 mL/min/{1.73_m2} (ref >=60)
Globulin: 1.7 g/dL (calc) — ABNORMAL LOW (ref 1.9–3.7)
Glucose, Bld: 105 mg/dL — ABNORMAL HIGH (ref 65–99)
Potassium: 4.5 mmol/L (ref 3.5–5.3)
Sodium: 142 mmol/L (ref 135–146)
Total Bilirubin: 0.4 mg/dL (ref 0.2–1.2)
Total Protein: 5.5 g/dL — ABNORMAL LOW (ref 6.1–8.1)

## 2020-08-17 NOTE — Progress Notes (Signed)
Kidney functions have improved.  Liver functions are mildly elevated.  Most likely due to statin use.

## 2020-08-18 DIAGNOSIS — Z760 Encounter for issue of repeat prescription: Secondary | ICD-10-CM | POA: Diagnosis not present

## 2020-08-18 DIAGNOSIS — G5603 Carpal tunnel syndrome, bilateral upper limbs: Secondary | ICD-10-CM | POA: Diagnosis not present

## 2020-08-18 DIAGNOSIS — M545 Low back pain, unspecified: Secondary | ICD-10-CM | POA: Diagnosis not present

## 2020-08-18 DIAGNOSIS — M542 Cervicalgia: Secondary | ICD-10-CM | POA: Diagnosis not present

## 2020-08-18 DIAGNOSIS — Z23 Encounter for immunization: Secondary | ICD-10-CM | POA: Diagnosis not present

## 2020-09-01 DIAGNOSIS — R58 Hemorrhage, not elsewhere classified: Secondary | ICD-10-CM | POA: Diagnosis not present

## 2020-09-01 DIAGNOSIS — L821 Other seborrheic keratosis: Secondary | ICD-10-CM | POA: Diagnosis not present

## 2020-09-01 DIAGNOSIS — L578 Other skin changes due to chronic exposure to nonionizing radiation: Secondary | ICD-10-CM | POA: Diagnosis not present

## 2020-09-01 DIAGNOSIS — D485 Neoplasm of uncertain behavior of skin: Secondary | ICD-10-CM | POA: Diagnosis not present

## 2020-09-01 DIAGNOSIS — D2271 Melanocytic nevi of right lower limb, including hip: Secondary | ICD-10-CM | POA: Diagnosis not present

## 2020-09-01 DIAGNOSIS — L900 Lichen sclerosus et atrophicus: Secondary | ICD-10-CM | POA: Diagnosis not present

## 2020-09-01 DIAGNOSIS — Z85828 Personal history of other malignant neoplasm of skin: Secondary | ICD-10-CM | POA: Diagnosis not present

## 2020-09-05 ENCOUNTER — Other Ambulatory Visit: Payer: Self-pay

## 2020-09-05 ENCOUNTER — Ambulatory Visit (INDEPENDENT_AMBULATORY_CARE_PROVIDER_SITE_OTHER): Payer: Medicare Other | Admitting: Endocrinology

## 2020-09-05 ENCOUNTER — Encounter: Payer: Self-pay | Admitting: Endocrinology

## 2020-09-05 VITALS — BP 116/72 | HR 78 | Ht 71.0 in | Wt 162.0 lb

## 2020-09-05 DIAGNOSIS — I2510 Atherosclerotic heart disease of native coronary artery without angina pectoris: Secondary | ICD-10-CM

## 2020-09-05 DIAGNOSIS — E1140 Type 2 diabetes mellitus with diabetic neuropathy, unspecified: Secondary | ICD-10-CM

## 2020-09-05 LAB — POCT GLYCOSYLATED HEMOGLOBIN (HGB A1C): Hemoglobin A1C: 7.6 % — AB (ref 4.0–5.6)

## 2020-09-05 MED ORDER — REPAGLINIDE 2 MG PO TABS
2.0000 mg | ORAL_TABLET | Freq: Two times a day (BID) | ORAL | 3 refills | Status: DC
Start: 2020-09-05 — End: 2021-08-13

## 2020-09-05 MED ORDER — TRULICITY 3 MG/0.5ML ~~LOC~~ SOAJ: 3.0000 mg | SUBCUTANEOUS | 3 refills | Status: DC

## 2020-09-05 NOTE — Progress Notes (Signed)
Subjective:    Patient ID: Savannah [name], female    DOB: May 14, 1956, 64 y.o.   MRN: [id number]  HPI Pt returns for f/u of diabetes mellitus:  DM type: 2 Dx'ed: 6222 Complications: painful PN, and CAD (seen on CT).   Therapy: Trulicity and 3 oral meds.   GDM: never DKA: never Severe hypoglycemia: never.   Pancreatitis: never.  Other: she took insulin from 2004-2011, when she had gastric bypass surgery.  She is retired.   Interval history: She says cbg's are well-controlled.  She denies hypoglycemia.  She takes meds as rx'ed.   Past Medical History:  Diagnosis Date  . ABDOMINAL PAIN, CHRONIC 10/20/2009  . Acute cystitis 08/17/2009  . ALLERGIC RHINITIS 11/21/2007  . Allergy   . ASYMPTOMATIC POSTMENOPAUSAL STATUS 09/29/2008  . Bariatric surgery status 07/07/2010  . Bariatric surgery status 07/07/2010   Qualifier: Diagnosis of  By: Loanne Drilling MD, Jacelyn Pi   . Cancer ([place])    uterine  . Cataract   . Coronary artery disease   . DEPRESSION 11/21/2007  . DIABETES MELLITUS, TYPE II 07/08/2007  . DYSPNEA 05/20/2009  . Edema 05/20/2009  . Eosinophilic esophagitis 07/20/9891  . FEVER UNSPECIFIED 10/20/2009  . FEVER, HX OF 03/09/2010  . GERD (gastroesophageal reflux disease)   . Headache(784.0) 02/06/2010  . Hepatomegaly 01/06/2008  . HYPERLIPIDEMIA 07/08/2007  . HYPERTENSION 07/08/2007  . LEUKOPENIA, MILD 09/29/2008  . OBESITY 01/06/2008  . OSTEOARTHRITIS 01/06/2008  . Other chronic nonalcoholic liver disease 11/30/4172  . PERIPHERAL NEUROPATHY 11/21/2007  . Peripheral neuropathy   . Postsurgical hypothyroidism 09/19/2010  . SINUSITIS- ACUTE-NOS 11/21/2007  . TB SKIN TEST, POSITIVE 02/06/2010  . THYROID NODULE 03/09/2010    Past Surgical History:  Procedure Laterality Date  . ABDOMINAL HYSTERECTOMY    . BASAL CELL CARCINOMA EXCISION    . CARDIAC CATHETERIZATION    . COLONOSCOPY    . EYE SURGERY Bilateral 08/2018, 09/2018  . FOOT SURGERY Left    10/2019  . KNEE ARTHROSCOPY Left   . LEFT HEART CATH AND  CORONARY ANGIOGRAPHY N/A 01/20/2019   Procedure: LEFT HEART CATH AND CORONARY ANGIOGRAPHY;  Surgeon: Belva Crome, MD;  Location: [facility];  Service: Cardiovascular;  Laterality: N/A;  . LYMPH NODE DISSECTION    . OOPHORECTOMY    . Right total knee replacement    . ROUX-EN-Y GASTRIC BYPASS  2011  . SKIN TAG REMOVAL    . THYROIDECTOMY    . TONSILLECTOMY AND ADENOIDECTOMY    . UPPER GASTROINTESTINAL ENDOSCOPY      Social History   Socioeconomic History  . Marital status: Single    Spouse name: Not on file  . Number of children: 0  . Years of education: 16  . Highest education level: Bachelor's degree (e.g., BA, AB, BS)  Occupational History  . Occupation: Optometrist  . Occupation: Retired  Tobacco Use  . Smoking status: Former Smoker    Packs/day: 1.50    Years: 15.00    Pack years: 22.50    Types: Cigarettes    Quit date: 1992    Years since quitting: 29.8  . Smokeless tobacco: Never Used  Vaping Use  . Vaping Use: Never used  Substance and Sexual Activity  . Alcohol use: Yes    Comment: rarely  . Drug use: Never  . Sexual activity: Not Currently  Other Topics Concern  . Not on file  Social History Narrative  . Not on file   Social Determinants of  Health   Financial Resource Strain: Low Risk   . Difficulty of Paying Living Expenses: Not hard at all  Food Insecurity: No Food Insecurity  . Worried About Charity fundraiser in the Last Year: Never true  . Ran Out of Food in the Last Year: Never true  Transportation Needs: Unknown  . Lack of Transportation (Medical): Not on file  . Lack of Transportation (Non-Medical): No  Physical Activity: Sufficiently Active  . Days of Exercise per Week: 5 days  . Minutes of Exercise per Session: 60 min  Stress: No Stress Concern Present  . Feeling of Stress : Not at all  Social Connections: Moderately Integrated  . Frequency of Communication with Friends and Family: More than three times a week  . Frequency of  Social Gatherings with Friends and Family: More than three times a week  . Attends Religious Services: 1 to 4 times per year  . Active Member of Clubs or Organizations: Yes  . Attends Archivist Meetings: 1 to 4 times per year  . Marital Status: Never married  Intimate Partner Violence: Not At Risk  . Fear of Current or Ex-Partner: No  . Emotionally Abused: No  . Physically Abused: No  . Sexually Abused: No    Current Outpatient Medications on File Prior to Visit  Medication Sig Dispense Refill  . amoxicillin (AMOXIL) 500 MG capsule TAKE 4 CAPSULES BY MOUTH ONE HOUR BEFORE DENTAL APPT.    [name] aspirin EC 81 MG tablet Take 1 tablet (81 mg total) by mouth daily. Starting 01/18/19 prior to cardiac cath    . BIOTIN PO Take by mouth.    . canagliflozin (INVOKANA) 300 MG TABS tablet Take 1 tablet (300 mg total) by mouth daily before breakfast. 90 tablet 3  . Cholecalciferol (VITAMIN D3) 125 MCG (5000 UT) CAPS Take 5,000 Units by mouth daily.    . cyclobenzaprine (FLEXERIL) 10 MG tablet Take 10 mg by mouth at bedtime as needed.     . desloratadine (CLARINEX) 5 MG tablet Take 5 mg by mouth daily.    . diclofenac sodium (VOLTAREN) 1 % GEL Apply 4 g topically 4 (four) times daily. (Patient taking differently: Apply 4 g topically as needed (pain). ) 100 g 11  . docusate sodium (COLACE) 100 MG capsule Take 200 mg by mouth 2 (two) times daily.    . Fluconazole (DIFLUCAN PO) Take by mouth as needed.    . gabapentin (NEURONTIN) 300 MG capsule Take 3 capsules (900 mg total) by mouth 3 (three) times daily. 360 capsule 2  . halobetasol (ULTRAVATE) 0.05 % cream Apply 1 application topically 2 (two) times daily as needed (lichen sclerosus).   2  . HYDROcodone-acetaminophen (NORCO/VICODIN) 5-325 MG tablet Take 1 tablet by mouth daily as needed for moderate pain or severe pain.    [name] levothyroxine (SYNTHROID) 112 MCG tablet TAKE 1 TABLET BY MOUTH EVERY DAY (Patient taking differently: Take 112 mcg by mouth  daily before breakfast. ) 90 tablet 3  . MAGNESIUM PO Take 1 tablet by mouth daily as needed (cramps).     . metFORMIN (GLUCOPHAGE-XR) 500 MG 24 hr tablet Take 4 tablets (2,000 mg total) by mouth daily. 360 tablet 3  . MITIGARE 0.6 MG CAPS TAKE 1 CAPSULE BY MOUTH EVERY DAY 90 capsule 0  . Multiple Vitamin (MULTIVITAMIN) tablet Take 2 tablets by mouth daily.     . nitroGLYCERIN (NITROSTAT) 0.4 MG SL tablet Place 1 tablet (0.4 mg total) under the  tongue every 5 (five) minutes as needed. 25 tablet 3  . ONETOUCH ULTRA test strip USE TO MONITOR GLUCOSE LEVELS ONCE PER DAY E11.40 100 strip 2  . oxyCODONE-acetaminophen (PERCOCET) 5-325 MG tablet Take 1 tablet by mouth every 6 (six) hours as needed for severe pain. 30 tablet 0  . pantoprazole (PROTONIX) 40 MG tablet TAKE 1 TABLET BY MOUTH TWICE A DAY 180 tablet 1  . phenazopyridine (PYRIDIUM) 100 MG tablet Take 100 mg by mouth 3 (three) times daily as needed for pain.    . Probiotic CAPS Take 1 capsule by mouth daily.    . traMADol (ULTRAM) 50 MG tablet Take 50 mg by mouth 2 (two) times daily as needed for moderate pain.    [name] atorvastatin (LIPITOR) 40 MG tablet Take 1 tablet (40 mg total) by mouth daily. 90 tablet 3  . furosemide (LASIX) 20 MG tablet Take 1 tablet (20 mg total) by mouth daily. (Patient taking differently: Take 20 mg by mouth daily as needed for fluid. ) 90 tablet 3   No current facility-administered medications on file prior to visit.    Allergies  Allergen Reactions  . Ace Inhibitors Cough  . Caffeine     PVCs  . Ciprofloxacin Itching  . Morphine And Related Itching    Pt tolerates medication if given with diphenhydramine  . Mucinex [Guaifenesin Er]     PVCs  . Nsaids     Gastric Bypass Surgery - unable to take, tolerates ec 81 aspirin   . Other     Antihistamine-alkylamine - PVCs tolerates benadryl   . Silicone Hives    Family History  Problem Relation Age of Onset  . Multiple sclerosis Sister   . Breast cancer Sister         breast onset age 12  . Osteoporosis Sister   . Diabetes Mother   . Diabetes Father   . Congestive Heart Failure Father   . Hypertension Father   . Myelodysplastic syndrome Father   . Liver cancer Maternal Aunt   . Breast cancer Maternal Aunt   . Diabetes Maternal Aunt   . Colon cancer Maternal Uncle   . Diabetes Maternal Uncle   . Diabetes Maternal Grandmother   . Diabetes Paternal Grandmother     BP 116/72   Pulse 78   Ht 5' 11"  (1.803 m)   Wt 162 lb (73.5 kg)   SpO2 98%   BMI 22.59 kg/m    Review of Systems Denies nausea    Objective:   Physical Exam VITAL SIGNS:  See vs page GENERAL: no distress Pulses: dorsalis pedis intact bilat.   MSK: no deformity of the feet CV: 2+ left, and 1+ right leg edema Skin:  no ulcer on the feet.  normal color and temp on the feet. Neuro: sensation is intact to touch on the feet, but decreased from normal.    A1c=7.6%     Assessment & Plan:  Type 2 DM, with CAD: uncontrolled.     Patient Instructions  I have sent a prescription to your pharmacy, to increase the Trulicity, and:  Please continue the same other diabetes medications.  check your blood sugar once a day.  vary the time of day when you check, between before the 3 meals, and at bedtime.  also check if you have symptoms of your blood sugar being too high or too low.  please keep a record of the readings and bring it to your next appointment here (or you  can bring the meter itself).  You can write it on any piece of paper.  please call us sooner if your blood sugar goes below 70, or if you have a lot of readings over 200. Please come back for a follow-up appointment in 3 months.

## 2020-09-05 NOTE — Patient Instructions (Addendum)
I have sent a prescription to your pharmacy, to increase the Trulicity, and:  Please continue the same other diabetes medications.  check your blood sugar once a day.  vary the time of day when you check, between before the 3 meals, and at bedtime.  also check if you have symptoms of your blood sugar being too high or too low.  please keep a record of the readings and bring it to your next appointment here (or you can bring the meter itself).  You can write it on any piece of paper.  please call us sooner if your blood sugar goes below 70, or if you have a lot of readings over 200. Please come back for a follow-up appointment in 3 months.

## 2020-09-06 ENCOUNTER — Ambulatory Visit (INDEPENDENT_AMBULATORY_CARE_PROVIDER_SITE_OTHER): Payer: Medicare Other | Admitting: Internal Medicine

## 2020-09-06 ENCOUNTER — Ambulatory Visit: Payer: Medicare Other | Admitting: Rehabilitative and Restorative Service Providers"

## 2020-09-06 VITALS — BP 106/64 | HR 64 | Ht 71.0 in | Wt 162.0 lb

## 2020-09-06 DIAGNOSIS — E1140 Type 2 diabetes mellitus with diabetic neuropathy, unspecified: Secondary | ICD-10-CM | POA: Diagnosis not present

## 2020-09-06 DIAGNOSIS — Z79899 Other long term (current) drug therapy: Secondary | ICD-10-CM

## 2020-09-06 DIAGNOSIS — I5032 Chronic diastolic (congestive) heart failure: Secondary | ICD-10-CM

## 2020-09-06 DIAGNOSIS — E1169 Type 2 diabetes mellitus with other specified complication: Secondary | ICD-10-CM

## 2020-09-06 DIAGNOSIS — I2510 Atherosclerotic heart disease of native coronary artery without angina pectoris: Secondary | ICD-10-CM | POA: Diagnosis not present

## 2020-09-06 DIAGNOSIS — E785 Hyperlipidemia, unspecified: Secondary | ICD-10-CM

## 2020-09-06 DIAGNOSIS — I25119 Atherosclerotic heart disease of native coronary artery with unspecified angina pectoris: Secondary | ICD-10-CM

## 2020-09-06 MED ORDER — NITROGLYCERIN 0.4 MG SL SUBL: 0.4000 mg | SUBLINGUAL_TABLET | SUBLINGUAL | 3 refills | Status: DC | PRN

## 2020-09-06 NOTE — Patient Instructions (Signed)
Medication Instructions:  No Changes In Medications at this time.  *If you need a refill on your cardiac medications before your next appointment, please call your pharmacy*  Lab Work: None Ordered At This Time.  If you have labs (blood work) drawn today and your tests are completely normal, you will receive your results only by: [name] MyChart Message (if you have MyChart) OR . A paper copy in the mail If you have any lab test that is abnormal or we need to change your treatment, we will call you to review the results.  Testing/Procedures: None Ordered At This Time.   Follow-Up: At [facility], you and your health needs are our priority.  As part of our continuing mission to provide you with exceptional heart care, we have created designated Provider Care Teams.  These Care Teams include your primary Cardiologist (physician) and Advanced Practice Providers (APPs -  Physician Assistants and Nurse Practitioners) who all work together to provide you with the care you need, when you need it.  Your next appointment:   6 month(s)  The format for your next appointment:   In Person  Provider:   Cherlynn Kaiser, MD

## 2020-09-06 NOTE — Progress Notes (Signed)
Cardiology Office Note:    Date:  09/06/2020   ID:  Savannah [name], DOB 09/29/1956, MRN [id number]  PCP:  Parker, Caleb M, MD  Cardiologist:  Gayatri A Acharya, MD  Electrophysiologist:  None   Referring MD: Parker, Caleb M, MD   Chief Complaint/Reason for Referral: CAD  History of Present Illness:    Savannah [name] is a 64 y.o. female with a history of significant three-vessel coronary artery disease with no severe lesions involving distal small vascular territories.  She has had associated chest pain which we are medically managing.  History also includes PVCs, esophageal rings with multiple previous dilations, bariatric surgery in 2011 with appropriate postoperative weight loss.  She denies recent chest discomfort or shortness of breath.  She describes mild positional dizziness.  She has been quite active with water aerobics 3 times a week exercising 30 minutes on the treadmill for approximately 3 miles.  She is also participating in tai chi.  No exertional chest pain or shortness of breath.  She describes epigastric pain, with no particular pattern.  No definite associated foods or exacerbating factors.  She has had this in the past related to increased carbohydrate intake, she notes areas of improvement for her diet.  Past Medical History:  Diagnosis Date  . ABDOMINAL PAIN, CHRONIC 10/20/2009  . Acute cystitis 08/17/2009  . ALLERGIC RHINITIS 11/21/2007  . Allergy   . ASYMPTOMATIC POSTMENOPAUSAL STATUS 09/29/2008  . Bariatric surgery status 07/07/2010  . Bariatric surgery status 07/07/2010   Qualifier: Diagnosis of  By: Ellison MD, Sean A   . Cancer (HCC)    uterine  . Cataract   . Coronary artery disease   . DEPRESSION 11/21/2007  . DIABETES MELLITUS, TYPE II 07/08/2007  . DYSPNEA 05/20/2009  . Edema 05/20/2009  . Eosinophilic esophagitis 02/13/2008  . FEVER UNSPECIFIED 10/20/2009  . FEVER, HX OF 03/09/2010  . GERD (gastroesophageal reflux disease)   . Headache(784.0) 02/06/2010  .  Hepatomegaly 01/06/2008  . HYPERLIPIDEMIA 07/08/2007  . HYPERTENSION 07/08/2007  . LEUKOPENIA, MILD 09/29/2008  . OBESITY 01/06/2008  . OSTEOARTHRITIS 01/06/2008  . Other chronic nonalcoholic liver disease 01/06/2008  . PERIPHERAL NEUROPATHY 11/21/2007  . Peripheral neuropathy   . Postsurgical hypothyroidism 09/19/2010  . SINUSITIS- ACUTE-NOS 11/21/2007  . TB SKIN TEST, POSITIVE 02/06/2010  . THYROID NODULE 03/09/2010    Past Surgical History:  Procedure Laterality Date  . ABDOMINAL HYSTERECTOMY    . BASAL CELL CARCINOMA EXCISION    . CARDIAC CATHETERIZATION    . COLONOSCOPY    . EYE SURGERY Bilateral 08/2018, 09/2018  . FOOT SURGERY Left    10/2019  . KNEE ARTHROSCOPY Left   . LEFT HEART CATH AND CORONARY ANGIOGRAPHY N/A 01/20/2019   Procedure: LEFT HEART CATH AND CORONARY ANGIOGRAPHY;  Surgeon: Smith, Henry W, MD;  Location: [facility];  Service: Cardiovascular;  Laterality: N/A;  . LYMPH NODE DISSECTION    . OOPHORECTOMY    . Right total knee replacement    . ROUX-EN-Y GASTRIC BYPASS  2011  . SKIN TAG REMOVAL    . THYROIDECTOMY    . TONSILLECTOMY AND ADENOIDECTOMY    . UPPER GASTROINTESTINAL ENDOSCOPY      Current Medications: Current Meds  Medication Sig  . amoxicillin (AMOXIL) 500 MG capsule TAKE 4 CAPSULES BY MOUTH ONE HOUR BEFORE DENTAL APPT.  . aspirin EC 81 MG tablet Take 1 tablet (81 mg total) by mouth daily. Starting 01/18/19 prior to cardiac cath  . atorvastatin (LIPITOR)   40 MG tablet Take 1 tablet (40 mg total) by mouth daily.  . BIOTIN PO Take by mouth.  . canagliflozin (INVOKANA) 300 MG TABS tablet Take 1 tablet (300 mg total) by mouth daily before breakfast.  . Cholecalciferol (VITAMIN D3) 125 MCG (5000 UT) CAPS Take 5,000 Units by mouth daily.  . cyclobenzaprine (FLEXERIL) 10 MG tablet Take 10 mg by mouth at bedtime as needed.   . desloratadine (CLARINEX) 5 MG tablet Take 5 mg by mouth daily.  . diclofenac sodium (VOLTAREN) 1 % GEL Apply 4 g topically 4 (four)  times daily. (Patient taking differently: Apply 4 g topically as needed (pain). )  . docusate sodium (COLACE) 100 MG capsule Take 200 mg by mouth 2 (two) times daily.  . Dulaglutide (TRULICITY) 3 MG/0.5ML SOPN Inject 3 mg as directed once a week.  . Fluconazole (DIFLUCAN PO) Take by mouth as needed.  . gabapentin (NEURONTIN) 300 MG capsule Take 3 capsules (900 mg total) by mouth 3 (three) times daily.  . halobetasol (ULTRAVATE) 0.05 % cream Apply 1 application topically 2 (two) times daily as needed (lichen sclerosus).   . HYDROcodone-acetaminophen (NORCO/VICODIN) 5-325 MG tablet Take 1 tablet by mouth daily as needed for moderate pain or severe pain.  . levothyroxine (SYNTHROID) 112 MCG tablet TAKE 1 TABLET BY MOUTH EVERY DAY (Patient taking differently: Take 112 mcg by mouth daily before breakfast. )  . MAGNESIUM PO Take 1 tablet by mouth daily as needed (cramps).   . metFORMIN (GLUCOPHAGE-XR) 500 MG 24 hr tablet Take 4 tablets (2,000 mg total) by mouth daily.  . MITIGARE 0.6 MG CAPS TAKE 1 CAPSULE BY MOUTH EVERY DAY  . Multiple Vitamin (MULTIVITAMIN) tablet Take 2 tablets by mouth daily.   . nitroGLYCERIN (NITROSTAT) 0.4 MG SL tablet Place 1 tablet (0.4 mg total) under the tongue every 5 (five) minutes as needed.  . ONETOUCH ULTRA test strip USE TO MONITOR GLUCOSE LEVELS ONCE PER DAY E11.40  . oxyCODONE-acetaminophen (PERCOCET) 5-325 MG tablet Take 1 tablet by mouth every 6 (six) hours as needed for severe pain.  . pantoprazole (PROTONIX) 40 MG tablet TAKE 1 TABLET BY MOUTH TWICE A DAY  . phenazopyridine (PYRIDIUM) 100 MG tablet Take 100 mg by mouth 3 (three) times daily as needed for pain.  . Probiotic CAPS Take 1 capsule by mouth daily.  . repaglinide (PRANDIN) 2 MG tablet Take 1 tablet (2 mg total) by mouth 2 (two) times daily before a meal.  . traMADol (ULTRAM) 50 MG tablet Take 50 mg by mouth 2 (two) times daily as needed for moderate pain.     Allergies:   Ace inhibitors, Caffeine,  Ciprofloxacin, Morphine and related, Mucinex [guaifenesin er], Nsaids, Other, and Silicone   Social History   Tobacco Use  . Smoking status: Former Smoker    Packs/day: 1.50    Years: 15.00    Pack years: 22.50    Types: Cigarettes    Quit date: 1992    Years since quitting: 29.8  . Smokeless tobacco: Never Used  Vaping Use  . Vaping Use: Never used  Substance Use Topics  . Alcohol use: Yes    Comment: rarely  . Drug use: Never     Family History: The patient's family history includes Breast cancer in her maternal aunt and sister; Colon cancer in her maternal uncle; Congestive Heart Failure in her father; Diabetes in her father, maternal aunt, maternal grandmother, maternal uncle, mother, and paternal grandmother; Hypertension in her father; Liver cancer   in her maternal aunt; Multiple sclerosis in her sister; Myelodysplastic syndrome in her father; Osteoporosis in her sister.  ROS:   Please see the history of present illness.    All other systems reviewed and are negative.  EKGs/Labs/Other Studies Reviewed:    The following studies were reviewed today:  EKG:  NSR rate 64 bpm  Recent Labs: 01/14/2020: TSH 0.92 08/10/2020: Hemoglobin 13.0; Platelet Count 189 08/16/2020: ALT 42; BUN 24; Creat 0.94; Potassium 4.5; Sodium 142  Recent Lipid Panel    Component Value Date/Time   CHOL 136 04/29/2019 1336   TRIG 102 04/29/2019 1336   HDL 47 04/29/2019 1336   CHOLHDL 2.9 04/29/2019 1336   CHOLHDL 4 07/04/2018 1403   VLDL 24.6 07/04/2018 1403   LDLCALC 69 04/29/2019 1336   LDLDIRECT 99.0 10/12/2011 1219    Physical Exam:    VS:  BP 106/64 (BP Location: Right Arm, Patient Position: Sitting, Cuff Size: Normal)   Pulse 64   Ht 5' 11" (1.803 m)   Wt 162 lb (73.5 kg)   BMI 22.59 kg/m     Wt Readings from Last 5 Encounters:  09/06/20 162 lb (73.5 kg)  09/05/20 162 lb (73.5 kg)  08/16/20 170 lb 3.2 oz (77.2 kg)  08/10/20 167 lb 7 oz (75.9 kg)  05/10/20 168 lb 11.2 oz (76.5  kg)    Constitutional: No acute distress Eyes: sclera non-icteric, normal conjunctiva and lids ENMT: normal dentition, moist mucous membranes Cardiovascular: regular rhythm, normal rate, no murmurs. S1 and S2 normal. Radial pulses normal bilaterally. No jugular venous distention.  Respiratory: clear to auscultation bilaterally GI : normal bowel sounds, soft and nontender. No distention.   MSK: extremities warm, well perfused. No edema.  NEURO: grossly nonfocal exam, moves all extremities. PSYCH: alert and oriented x 3, normal mood and affect.   ASSESSMENT:    1. Coronary artery disease involving native coronary artery of native heart without angina pectoris   2. Chronic diastolic CHF (congestive heart failure) ([place])   3. Dyslipidemia associated with type 2 diabetes mellitus ([place])   4. Type 2 diabetes mellitus with diabetic neuropathy, unspecified whether long term insulin use ([place])   5. Medication management    PLAN:    Coronary artery disease -No recent angina.  Patient requests new prescription for nitroglycerin to have on hand, she has not had to use this in the recent past. -Medical management of CAD includes aspirin 81 mg daily, atorvastatin 40 mg daily. -Previously on metoprolol, currently no beta-blocker due to prior significant orthostasis.  Chronic diastolic heart failure -Euvolemic and well compensated on exam today.  Continue Lasix 20 mg as needed.  No recent heart failure decompensation.  Overall NYHA class I symptoms.  Dyslipidemia -Continue atorvastatin 40 mg daily.  Total time of encounter: 30 minutes total time of encounter, including 20 minutes spent in face-to-face patient care on the date of this encounter. This time includes coordination of care and counseling regarding above mentioned problem list. Remainder of non-face-to-face time involved reviewing chart documents/testing relevant to the patient encounter and documentation in the medical record. I have  independently reviewed documentation from referring provider.   Cherlynn Kaiser, MD [place]  [facility]    Medication Adjustments/Labs and Tests Ordered: Current medicines are reviewed at length with the patient today.  Concerns regarding medicines are outlined above.   Orders Placed This Encounter  Procedures  . EKG 12-Lead    Meds ordered this encounter  Medications  . nitroGLYCERIN (NITROSTAT)  0.4 MG SL tablet    Sig: Place 1 tablet (0.4 mg total) under the tongue every 5 (five) minutes as needed.    Dispense:  25 tablet    Refill:  3    Patient Instructions  Medication Instructions:  No Changes In Medications at this time.  *If you need a refill on your cardiac medications before your next appointment, please call your pharmacy*  Lab Work: None Ordered At This Time.  If you have labs (blood work) drawn today and your tests are completely normal, you will receive your results only by: [name] MyChart Message (if you have MyChart) OR . A paper copy in the mail If you have any lab test that is abnormal or we need to change your treatment, we will call you to review the results.  Testing/Procedures: None Ordered At This Time.   Follow-Up: At [facility], you and your health needs are our priority.  As part of our continuing mission to provide you with exceptional heart care, we have created designated Provider Care Teams.  These Care Teams include your primary Cardiologist (physician) and Advanced Practice Providers (APPs -  Physician Assistants and Nurse Practitioners) who all work together to provide you with the care you need, when you need it.  Your next appointment:   6 month(s)  The format for your next appointment:   In Person  Provider:   Cherlynn Kaiser, MD

## 2020-09-12 ENCOUNTER — Other Ambulatory Visit: Payer: Self-pay | Admitting: Physician Assistant

## 2020-09-12 ENCOUNTER — Other Ambulatory Visit: Payer: Self-pay | Admitting: Family Medicine

## 2020-09-12 NOTE — Telephone Encounter (Signed)
Last Visit: 08/16/2020 Next Visit: 02/13/2021 Labs: 08/10/2020 CBC: MCV 101.5, Glucose 105, Total Protein 5.5, Globulin 1.7, ALT 42  Okay to refill Mitigare?

## 2020-09-16 ENCOUNTER — Encounter: Payer: Self-pay | Admitting: Rehabilitative and Restorative Service Providers"

## 2020-09-16 ENCOUNTER — Ambulatory Visit (INDEPENDENT_AMBULATORY_CARE_PROVIDER_SITE_OTHER): Payer: Medicare Other | Admitting: Rehabilitative and Restorative Service Providers"

## 2020-09-16 ENCOUNTER — Other Ambulatory Visit: Payer: Self-pay

## 2020-09-16 DIAGNOSIS — R2681 Unsteadiness on feet: Secondary | ICD-10-CM

## 2020-09-16 DIAGNOSIS — M25551 Pain in right hip: Secondary | ICD-10-CM | POA: Diagnosis not present

## 2020-09-16 DIAGNOSIS — M25552 Pain in left hip: Secondary | ICD-10-CM

## 2020-09-16 DIAGNOSIS — R262 Difficulty in walking, not elsewhere classified: Secondary | ICD-10-CM | POA: Diagnosis not present

## 2020-09-16 DIAGNOSIS — M6281 Muscle weakness (generalized): Secondary | ICD-10-CM | POA: Diagnosis not present

## 2020-09-16 DIAGNOSIS — M25651 Stiffness of right hip, not elsewhere classified: Secondary | ICD-10-CM

## 2020-09-16 DIAGNOSIS — M25652 Stiffness of left hip, not elsewhere classified: Secondary | ICD-10-CM | POA: Diagnosis not present

## 2020-09-16 NOTE — Therapy (Signed)
[facility] [address] [place], [place], [address] Phone: [phone]   Fax:  [phone]  Physical Therapy Evaluation  Patient Details  Name: Savannah [name] MRN: [id number] Date of Birth: July 18, 1956 Referring Provider (PT): Vivi Barrack MD   Encounter Date: 09/16/2020   PT End of Session - 09/16/20 1649    Visit Number 1    Number of Visits 16    PT Start Time 1107    PT Stop Time 1062    PT Time Calculation (min) 38 min    Activity Tolerance No increased pain;Patient tolerated treatment well    Behavior During Therapy [facility] for tasks assessed/performed           Past Medical History:  Diagnosis Date  . ABDOMINAL PAIN, CHRONIC 10/20/2009  . Acute cystitis 08/17/2009  . ALLERGIC RHINITIS 11/21/2007  . Allergy   . ASYMPTOMATIC POSTMENOPAUSAL STATUS 09/29/2008  . Bariatric surgery status 07/07/2010  . Bariatric surgery status 07/07/2010   Qualifier: Diagnosis of  By: Loanne Drilling MD, Jacelyn Pi   . Cancer ([place])    uterine  . Cataract   . Coronary artery disease   . DEPRESSION 11/21/2007  . DIABETES MELLITUS, TYPE II 07/08/2007  . DYSPNEA 05/20/2009  . Edema 05/20/2009  . Eosinophilic esophagitis 04/20/4853  . FEVER UNSPECIFIED 10/20/2009  . FEVER, HX OF 03/09/2010  . GERD (gastroesophageal reflux disease)   . Headache(784.0) 02/06/2010  . Hepatomegaly 01/06/2008  . HYPERLIPIDEMIA 07/08/2007  . HYPERTENSION 07/08/2007  . LEUKOPENIA, MILD 09/29/2008  . OBESITY 01/06/2008  . OSTEOARTHRITIS 01/06/2008  . Other chronic nonalcoholic liver disease 05/08/349  . PERIPHERAL NEUROPATHY 11/21/2007  . Peripheral neuropathy   . Postsurgical hypothyroidism 09/19/2010  . SINUSITIS- ACUTE-NOS 11/21/2007  . TB SKIN TEST, POSITIVE 02/06/2010  . THYROID NODULE 03/09/2010    Past Surgical History:  Procedure Laterality Date  . ABDOMINAL HYSTERECTOMY    . BASAL CELL CARCINOMA EXCISION    . CARDIAC CATHETERIZATION    . COLONOSCOPY    . EYE SURGERY Bilateral 08/2018, 09/2018   . FOOT SURGERY Left    10/2019  . KNEE ARTHROSCOPY Left   . LEFT HEART CATH AND CORONARY ANGIOGRAPHY N/A 01/20/2019   Procedure: LEFT HEART CATH AND CORONARY ANGIOGRAPHY;  Surgeon: Belva Crome, MD;  Location: [facility];  Service: Cardiovascular;  Laterality: N/A;  . LYMPH NODE DISSECTION    . OOPHORECTOMY    . Right total knee replacement    . ROUX-EN-Y GASTRIC BYPASS  2011  . SKIN TAG REMOVAL    . THYROIDECTOMY    . TONSILLECTOMY AND ADENOIDECTOMY    . UPPER GASTROINTESTINAL ENDOSCOPY      There were no vitals filed for this visit.    Subjective Assessment - 09/16/20 1643    Subjective Savannah [name] reports B hip pain that has been increasing over the past > month.  She is also concerned about her balance.  She did not have current concerns about her L knee.    Pertinent History Previous L foot surgery, L knee scope, R knee TKA and gastric bypass with 150+ pound weight loss.    Patient Stated Goals Be able to sit and sleep without B hip pain and improve confidence with balance.    Currently in Pain? Yes    Pain Score 4     Pain Location Hip    Pain Orientation Left;Right;Lateral    Pain Descriptors / Indicators Sore;Tightness    Pain Type Chronic pain  Pain Onset More than a month ago    Pain Frequency Constant    Aggravating Factors  Sitting and sleeping    Pain Relieving Factors Change of position    Effect of Pain on Daily Activities Sleep is severely affected              [facility] PT Assessment - 09/16/20 0001      Assessment   Medical Diagnosis B hip trochanteric bursitis, balance impairments, L knee OA    Referring Provider (PT) Vivi Barrack MD    Onset Date/Surgical Date --   Chronic     Balance Screen   Has the patient fallen in the past 6 months No    Has the patient had a decrease in activity level because of a fear of falling?  No    Is the patient reluctant to leave their home because of a fear of falling?  No      Prior Function   Level of  Independence Independent      Cognition   Overall Cognitive Status Within Functional Limits for tasks assessed      Observation/Other Assessments   Focus on Therapeutic Outcomes (FOTO)  58 (65 Goal)      ROM / Strength   AROM / PROM / Strength AROM;Strength      AROM   Overall AROM  Deficits    AROM Assessment Site Hip    Right/Left Hip Left;Right    Right Hip Flexion 90    Right Hip External Rotation  50    Right Hip Internal Rotation  -2    Left Hip Flexion 90    Left Hip External Rotation  47    Left Hip Internal Rotation  -7      Strength   Overall Strength Deficits    Strength Assessment Site Hip    Right/Left Hip Left;Right    Right Hip Flexion 3-/5    Right Hip ABduction 3-/5    Left Hip Flexion 3-/5    Left Hip ABduction 3-/5      Flexibility   Soft Tissue Assessment /Muscle Length yes    Hamstrings 45 degrees/45 degrees                      Objective measurements completed on examination: See above findings.       [place] Adult PT Treatment/Exercise - 09/16/20 0001      Neuro Re-ed    Neuro Re-ed Details  Heel to toe balance 4X 20 seconds (wide)      Exercises   Exercises Knee/Hip      Knee/Hip Exercises: Stretches   Hip Flexor Stretch Both;4 reps;20 seconds    Other Knee/Hip Stretches Knee to opposite shoulder stretch (Gluteal) 4X 20 seconds                  PT Education - 09/16/20 1645    Education Details Discussed hip anatomy and goals/limitations of PT with hip OA.  Prescribed and reviewed an appropriate HEP.    Person(s) Educated Patient    Methods Explanation;Demonstration;Verbal cues;Handout    Comprehension Verbalized understanding;Returned demonstration;Need further instruction;Verbal cues required               PT Long Term Goals - 09/16/20 1646      PT LONG TERM GOAL #1   Title Improve FOTO score to 65.    Baseline 58    Time 8    Period Weeks  Status New    Target Date 11/11/20      PT LONG TERM  GOAL #2   Title Savannah [name] will report B hip pain as consistently 0-3/10 on the Numeric Pain Rating Scale.    Baseline Can be 5+/10    Time 8    Period Weeks    Status New    Target Date 11/11/20      PT LONG TERM GOAL #3   Title Improve B hip AROM for flexion to 100 degrees; hamstrings to 45 degrees; IR to 10 degrees and ER to 45 degrees.    Baseline See objective.    Time 8    Period Weeks    Status New    Target Date 11/11/20      PT LONG TERM GOAL #4   Title Improve B hip strength and balance as assessed by self-report and functional scores.    Time 8    Period Weeks    Status New    Target Date 11/11/20      PT LONG TERM GOAL #5   Title Savannah [name] will be independent with her long-term HEP at [place].    Time 8    Period Weeks    Status New    Target Date 11/11/20                  Plan - 09/16/20 1142    Clinical Impression Statement Savannah [name] has B hip trochanteric bursitis, general weakness, balance impairments and nutritional issues (based on our conversation).  We discussed some ideas for diet and I encouraged her to consider following up with a nutritionist given her lack of strength gains with previous PT attempts (and her associated diet).  Savannah [name]'s prognosis with the bursitis and balance is good.  Hip OA will leave her with some pain and general strength gains will be limited by her nutrition.    Personal Factors and Comorbidities Comorbidity 1    Comorbidities R TKA, L knee scope, nutritional deficits    Examination-Activity Limitations Sit;Sleep;Locomotion Level    Examination-Participation Restrictions Community Activity    Stability/Clinical Decision Making Evolving/Moderate complexity    Clinical Decision Making Moderate    Rehab Potential Good    PT Frequency 2x / week    PT Duration 8 weeks    PT Treatment/Interventions ADLs/Self Care Home Management;Moist Heat;Cryotherapy;Therapeutic activities;Stair training;Gait training;Therapeutic exercise;Balance  training;Neuromuscular re-education;Patient/family education;Manual techniques;Dry needling    PT Next Visit Plan Balance and strength progressions to complement hip stretching    PT Home Exercise Plan Access Code: [email or web address]    Recommended Other Services Nutrition/Dietician follow-up    Consulted and Agree with Plan of Care Patient           Patient will benefit from skilled therapeutic intervention in order to improve the following deficits and impairments:  Abnormal gait, Decreased activity tolerance, Decreased balance, Decreased endurance, Decreased range of motion, Decreased strength, Difficulty walking, Hypomobility, Impaired flexibility, Pain  Visit Diagnosis: Difficulty walking  Muscle weakness (generalized)  Unsteady gait  Stiffness of left hip, not elsewhere classified  Stiffness of right hip, not elsewhere classified  Pain in left hip  Pain in right hip     Problem List Patient Active Problem List   Diagnosis Date Noted  . Iron deficiency anemia 03/14/2020  . Neutropenia ([place]) 02/29/2020  . Deficiency anemia 02/29/2020  . Breast lump 09/02/2019  . Lesion of vulva 09/02/2019  . Menorrhagia 09/02/2019  . Neuropathy 09/02/2019  . Abdominal pain, epigastric 06/25/2019  .  Irregular bowel habits 06/25/2019  . Coronary artery disease involving native coronary artery of native heart without angina pectoris   . Temporomandibular joint disorder 12/05/2018  . Fracture of upper limb 12/05/2018  . Fatigue 12/05/2018  . Inverted nipple 12/05/2018  . Senile purpura ([place]) 11/21/2018  . GERD with esophagitis 11/21/2018  . Pseudogout involving multiple joints 11/21/2018  . Lipoma 07/22/2017  . Lichen sclerosus 42/39/5320  . H/O gastric bypass 11/16/2016  . GERD (gastroesophageal reflux disease) 11/16/2016  . Depression 11/16/2016  . Diabetic polyneuropathy associated with type 2 diabetes mellitus ([place]) 11/16/2016  . Hyperlipidemia 11/16/2016  . Left lower quadrant pain  11/16/2016  . Uterine cancer [facility]) s/p hysterectomy 1997 01/04/2012  . Postsurgical hypothyroidism 09/19/2010  . Generalized abdominal pain 10/20/2009  . Diabetic neuropathy ([place]) 11/21/2007  . Allergic rhinitis 11/21/2007  . Type 2 diabetes mellitus with diabetic neuropathy, unspecified ([place]) 07/08/2007  . Dyslipidemia associated with type 2 diabetes mellitus ([place]) 07/08/2007    Farley Ly PT, MPT 09/16/2020, 4:58 PM  [facility] [address] [place], [place], [address] Phone: [phone]   Fax:  [phone]  Name: Savannah [name] MRN: [id number] Date of Birth: 09-08-1956

## 2020-09-16 NOTE — Patient Instructions (Signed)
Access Code: [email or web address] URL: [email or web address] Date: 09/16/2020 Prepared by: Vista Mink  Exercises Single Knee to Chest Stretch - 2-3 x daily - 7 x weekly - 1 sets - 5 reps - 20 seconds hold Supine Gluteus Stretch - 2-3 x daily - 7 x weekly - 1 sets - 5 reps - 20 seconds hold Tandem Stance - 2-3 x daily - 7 x weekly - 1 sets - 5 reps - 20 second hold

## 2020-09-19 DIAGNOSIS — L239 Allergic contact dermatitis, unspecified cause: Secondary | ICD-10-CM | POA: Diagnosis not present

## 2020-09-21 ENCOUNTER — Ambulatory Visit (INDEPENDENT_AMBULATORY_CARE_PROVIDER_SITE_OTHER): Payer: Medicare Other | Admitting: Physical Therapy

## 2020-09-21 ENCOUNTER — Other Ambulatory Visit: Payer: Self-pay

## 2020-09-21 DIAGNOSIS — M25552 Pain in left hip: Secondary | ICD-10-CM

## 2020-09-21 DIAGNOSIS — L239 Allergic contact dermatitis, unspecified cause: Secondary | ICD-10-CM | POA: Diagnosis not present

## 2020-09-21 DIAGNOSIS — M25651 Stiffness of right hip, not elsewhere classified: Secondary | ICD-10-CM | POA: Diagnosis not present

## 2020-09-21 DIAGNOSIS — R2681 Unsteadiness on feet: Secondary | ICD-10-CM | POA: Diagnosis not present

## 2020-09-21 DIAGNOSIS — M6281 Muscle weakness (generalized): Secondary | ICD-10-CM | POA: Diagnosis not present

## 2020-09-21 DIAGNOSIS — M25652 Stiffness of left hip, not elsewhere classified: Secondary | ICD-10-CM

## 2020-09-21 DIAGNOSIS — M25551 Pain in right hip: Secondary | ICD-10-CM

## 2020-09-21 DIAGNOSIS — R262 Difficulty in walking, not elsewhere classified: Secondary | ICD-10-CM

## 2020-09-21 NOTE — Therapy (Signed)
[facility] [address] [place], [place], [address] Phone: [phone]   Fax:  [phone]  Physical Therapy Treatment  Patient Details  Name: Savannah [name] MRN: [id number] Date of Birth: [birth year] Referring Provider (PT): Vivi Barrack MD   Encounter Date: 09/21/2020   PT End of Session - 09/21/20 1309    Visit Number 2    Number of Visits 16    PT Start Time 9449    PT Stop Time 6759    PT Time Calculation (min) 53 min    Activity Tolerance No increased pain;Patient tolerated treatment well    Behavior During Therapy [facility] for tasks assessed/performed           Past Medical History:  Diagnosis Date  . ABDOMINAL PAIN, CHRONIC 10/20/2009  . Acute cystitis 08/17/2009  . ALLERGIC RHINITIS 11/21/2007  . Allergy   . ASYMPTOMATIC POSTMENOPAUSAL STATUS 09/29/2008  . Bariatric surgery status 07/07/2010  . Bariatric surgery status 07/07/2010   Qualifier: Diagnosis of  By: Loanne Drilling MD, Jacelyn Pi   . Cancer ([place])    uterine  . Cataract   . Coronary artery disease   . DEPRESSION 11/21/2007  . DIABETES MELLITUS, TYPE II 07/08/2007  . DYSPNEA 05/20/2009  . Edema 05/20/2009  . Eosinophilic esophagitis 11/17/3844  . FEVER UNSPECIFIED 10/20/2009  . FEVER, HX OF 03/09/2010  . GERD (gastroesophageal reflux disease)   . Headache(784.0) 02/06/2010  . Hepatomegaly 01/06/2008  . HYPERLIPIDEMIA 07/08/2007  . HYPERTENSION 07/08/2007  . LEUKOPENIA, MILD 09/29/2008  . OBESITY 01/06/2008  . OSTEOARTHRITIS 01/06/2008  . Other chronic nonalcoholic liver disease 6/59/9357  . PERIPHERAL NEUROPATHY 11/21/2007  . Peripheral neuropathy   . Postsurgical hypothyroidism 09/19/2010  . SINUSITIS- ACUTE-NOS 11/21/2007  . TB SKIN TEST, POSITIVE 02/06/2010  . THYROID NODULE 03/09/2010    Past Surgical History:  Procedure Laterality Date  . ABDOMINAL HYSTERECTOMY    . BASAL CELL CARCINOMA EXCISION    . CARDIAC CATHETERIZATION    . COLONOSCOPY    . EYE SURGERY Bilateral 08/2018, 09/2018   . FOOT SURGERY Left    10/2019  . KNEE ARTHROSCOPY Left   . LEFT HEART CATH AND CORONARY ANGIOGRAPHY N/A 01/20/2019   Procedure: LEFT HEART CATH AND CORONARY ANGIOGRAPHY;  Surgeon: Belva Crome, MD;  Location: [facility];  Service: Cardiovascular;  Laterality: N/A;  . LYMPH NODE DISSECTION    . OOPHORECTOMY    . Right total knee replacement    . ROUX-EN-Y GASTRIC BYPASS  2011  . SKIN TAG REMOVAL    . THYROIDECTOMY    . TONSILLECTOMY AND ADENOIDECTOMY    . UPPER GASTROINTESTINAL ENDOSCOPY      There were no vitals filed for this visit.   Subjective Assessment - 09/21/20 1307    Subjective relays the hips are about 2/10 pain, everything else is 5/10 pain.    Pertinent History Previous L foot surgery, L knee scope, R knee TKA and gastric bypass with 150+ pound weight loss.    Patient Stated Goals Be able to sit and sleep without B hip pain and improve confidence with balance.    Pain Onset More than a month ago            [facility] Adult PT Treatment/Exercise - 09/21/20 0001      Neuro Re-ed    Neuro Re-ed Details  mod tandem balance 30 sec  X3 bilat, balance with feet together and eyes closed 30 sec  x3, balance on foam feet  together then progressed to head turns      Knee/Hip Exercises: Stretches   ITB Stretch Both;2 reps;30 seconds    Piriformis Stretch Right;Left;3 reps;30 seconds    Other Knee/Hip Stretches SKTC stretch 30 sec X 2 bilat      Knee/Hip Exercises: Aerobic   Nustep L3 X 6 min UE/LE seat 13      Knee/Hip Exercises: Seated   Sit to Sand without UE support   2X10 reps, 26 inch     Knee/Hip Exercises: Supine   Bridges 15 reps    Bridges Limitations hold 5 sec    Straight Leg Raises Both;2 sets;10 reps      Knee/Hip Exercises: Sidelying   Clams 2 sets of 15 bilat      Modalities   Modalities Moist Heat      Moist Heat Therapy   Number Minutes Moist Heat 10 Minutes    Moist Heat Location Lumbar Spine             PT Long Term Goals -  09/16/20 1646      PT LONG TERM GOAL #1   Title Improve FOTO score to 65.    Baseline 58    Time 8    Period Weeks    Status New    Target Date 11/11/20      PT LONG TERM GOAL #2   Title Milicent will report B hip pain as consistently 0-3/10 on the Numeric Pain Rating Scale.    Baseline Can be 5+/10    Time 8    Period Weeks    Status New    Target Date 11/11/20      PT LONG TERM GOAL #3   Title Improve B hip AROM for flexion to 100 degrees; hamstrings to 45 degrees; IR to 10 degrees and ER to 45 degrees.    Baseline See objective.    Time 8    Period Weeks    Status New    Target Date 11/11/20      PT LONG TERM GOAL #4   Title Improve B hip strength and balance as assessed by self-report and functional scores.    Time 8    Period Weeks    Status New    Target Date 11/11/20      PT LONG TERM GOAL #5   Title Jordanne will be independent with her long-term HEP at [place].    Time 8    Period Weeks    Status New    Target Date 11/11/20                 Plan - 09/21/20 1352    Clinical Impression Statement Session focused on stretching and strengthening for hips/lumbar to her tolerance. Then worked on balance with close supervision to min A overall support. Continue POC    Personal Factors and Comorbidities Comorbidity 1    Comorbidities R TKA, L knee scope, nutritional deficits    Examination-Activity Limitations Sit;Sleep;Locomotion Level    Examination-Participation Restrictions Community Activity    Stability/Clinical Decision Making Evolving/Moderate complexity    Rehab Potential Good    PT Frequency 2x / week    PT Duration 8 weeks    PT Treatment/Interventions ADLs/Self Care Home Management;Moist Heat;Cryotherapy;Therapeutic activities;Stair training;Gait training;Therapeutic exercise;Balance training;Neuromuscular re-education;Patient/family education;Manual techniques;Dry needling    PT Next Visit Plan Balance and strength progressions to complement hip stretching     PT Home Exercise Plan Access Code: [email or web address]    Consulted and Agree  with Plan of Care Patient           Patient will benefit from skilled therapeutic intervention in order to improve the following deficits and impairments:  Abnormal gait, Decreased activity tolerance, Decreased balance, Decreased endurance, Decreased range of motion, Decreased strength, Difficulty walking, Hypomobility, Impaired flexibility, Pain  Visit Diagnosis: Difficulty walking  Muscle weakness (generalized)  Unsteady gait  Stiffness of left hip, not elsewhere classified  Stiffness of right hip, not elsewhere classified  Pain in left hip  Pain in right hip     Problem List Patient Active Problem List   Diagnosis Date Noted  . Iron deficiency anemia 03/14/2020  . Neutropenia ([place]) 02/29/2020  . Deficiency anemia 02/29/2020  . Breast lump 09/02/2019  . Lesion of vulva 09/02/2019  . Menorrhagia 09/02/2019  . Neuropathy 09/02/2019  . Abdominal pain, epigastric 06/25/2019  . Irregular bowel habits 06/25/2019  . Coronary artery disease involving native coronary artery of native heart without angina pectoris   . Temporomandibular joint disorder 12/05/2018  . Fracture of upper limb 12/05/2018  . Fatigue 12/05/2018  . Inverted nipple 12/05/2018  . Senile purpura ([place]) 11/21/2018  . GERD with esophagitis 11/21/2018  . Pseudogout involving multiple joints 11/21/2018  . Lipoma 07/22/2017  . Lichen sclerosus 97/67/3419  . H/O gastric bypass 11/16/2016  . GERD (gastroesophageal reflux disease) 11/16/2016  . Depression 11/16/2016  . Diabetic polyneuropathy associated with type 2 diabetes mellitus ([place]) 11/16/2016  . Hyperlipidemia 11/16/2016  . Left lower quadrant pain 11/16/2016  . Uterine cancer [facility]) s/p hysterectomy 1997 01/04/2012  . Postsurgical hypothyroidism 09/19/2010  . Generalized abdominal pain 10/20/2009  . Diabetic neuropathy ([place]) 11/21/2007  . Allergic rhinitis 11/21/2007  . Type 2  diabetes mellitus with diabetic neuropathy, unspecified ([place]) 07/08/2007  . Dyslipidemia associated with type 2 diabetes mellitus ([place]) 07/08/2007    Silvestre Mesi 09/21/2020, 1:53 PM  [facility] Therapy [address] [place], [place], [address] Phone: [phone]   Fax:  [phone]  Name: DANIELYS MADRY MRN: [id number] Date of Birth: December 24, 1955

## 2020-09-23 DIAGNOSIS — L249 Irritant contact dermatitis, unspecified cause: Secondary | ICD-10-CM | POA: Diagnosis not present

## 2020-09-23 DIAGNOSIS — L259 Unspecified contact dermatitis, unspecified cause: Secondary | ICD-10-CM | POA: Diagnosis not present

## 2020-09-29 ENCOUNTER — Other Ambulatory Visit: Payer: Self-pay

## 2020-09-29 ENCOUNTER — Encounter: Payer: Self-pay | Admitting: Family Medicine

## 2020-09-29 ENCOUNTER — Ambulatory Visit (INDEPENDENT_AMBULATORY_CARE_PROVIDER_SITE_OTHER): Payer: Medicare Other | Admitting: Family Medicine

## 2020-09-29 VITALS — BP 93/63 | HR 86 | Temp 97.9°F | Ht 71.0 in | Wt 165.2 lb

## 2020-09-29 DIAGNOSIS — D509 Iron deficiency anemia, unspecified: Secondary | ICD-10-CM

## 2020-09-29 DIAGNOSIS — R7989 Other specified abnormal findings of blood chemistry: Secondary | ICD-10-CM | POA: Diagnosis not present

## 2020-09-29 DIAGNOSIS — E538 Deficiency of other specified B group vitamins: Secondary | ICD-10-CM | POA: Diagnosis not present

## 2020-09-29 DIAGNOSIS — D692 Other nonthrombocytopenic purpura: Secondary | ICD-10-CM

## 2020-09-29 DIAGNOSIS — M79662 Pain in left lower leg: Secondary | ICD-10-CM | POA: Diagnosis not present

## 2020-09-29 DIAGNOSIS — E1149 Type 2 diabetes mellitus with other diabetic neurological complication: Secondary | ICD-10-CM

## 2020-09-29 NOTE — Assessment & Plan Note (Signed)
Managed by oncology.  Check CBC today.

## 2020-09-29 NOTE — Progress Notes (Signed)
   Savannah [name] is a 64 y.o. female who presents today for an office visit.  Assessment/Plan:  New/Acute Problems: Left Calf Pain / Bruising / Leg Length Discrepancy / Foot Pain No red flags.  Patient predisposed to easy bruising secondary to aspirin use but will check CBC, CMET, and INR to evaluate for any other potential causes of easy bruising.  She did have concern for possible calf tear on ultrasound 6 months ago.  Given that she has had persistent symptoms since then it is concerning this tear may have not fully resolved.  Will place referral to sports medicine for further evaluation and management.  She also has chronic left foot pain related to recent heel fracture and leg length discrepancy which also likely contributing.  Chronic Problems Addressed Today: Iron deficiency anemia Managed by oncology.  Check CBC today.  Senile purpura (HCC) Check CBC, CBC.  Cardiologist wants her to stay on aspirin.  Diabetic neuropathy (HCC) Continue gabapentin 900 mg 3 times daily.  Check B12 level today.     Subjective:  HPI:  Patient here with recurrent bruising.  Has had issues with bruising on upper extremities for quite a while.  About 6 months ago had sudden onset bruising to left lower extremity after standing.  There was concern about blood clot.  Had ultrasound which was negative for blood clot but showed possible muscle tear.  She has had persistent bruising since then.  Some pain.  Bruising in left lower extremity predominantly located to medial aspect.  Symptoms can be exacerbated by walking.  She has quite a bit of foot pain related to heel fracture.  She also has leg length discrepancy which is making things more difficult.  She has tried inserts in the past but did not find them particularly effective.       Objective:  Physical Exam: BP 93/63   Pulse 86   Temp 97.9 F (36.6 C) (Temporal)   Ht 5' 11"  (1.803 m)   Wt 165 lb 3.2 oz (74.9 kg)   SpO2 98%   BMI 23.04 kg/m     Gen: No acute distress, resting comfortably MSK: Scattered ecchymosis to medial aspect of left posterior calf.  Normal plantarflexion distally.  Neurovascular intact distally. Neuro: Grossly normal, moves all extremities Psych: Normal affect and thought content      Undine Nealis M. Jerline Pain, MD 09/29/2020 1:27 PM

## 2020-09-29 NOTE — Patient Instructions (Signed)
It was very nice to see you today!  I am concerned that she may have a small tear in your calf totaling recurrent bleeding.  We will check blood work today.  I will place a referral for you to see the sports medicine doctor.  I will see you back soon for your annual physical.  Please come back to see me sooner if needed.  Take care, Dr Jerline Pain  Please try these tips to maintain a healthy lifestyle:   Eat at least 3 REAL meals and 1-2 snacks per day.  Aim for no more than 5 hours between eating.  If you eat breakfast, please do so within one hour of getting up.    Each meal should contain half fruits/vegetables, one quarter protein, and one quarter carbs (no bigger than a computer mouse)   Cut down on sweet beverages. This includes juice, soda, and sweet tea.     Drink at least 1 glass of water with each meal and aim for at least 8 glasses per day   Exercise at least 150 minutes every week.

## 2020-09-29 NOTE — Assessment & Plan Note (Signed)
Check CBC, CBC.  Cardiologist wants her to stay on aspirin.

## 2020-09-29 NOTE — Assessment & Plan Note (Signed)
Continue gabapentin 900 mg 3 times daily.  Check B12 level today.

## 2020-09-30 ENCOUNTER — Encounter: Payer: Medicare Other | Admitting: Physical Therapy

## 2020-09-30 ENCOUNTER — Encounter: Payer: Self-pay | Admitting: Physical Therapy

## 2020-09-30 ENCOUNTER — Ambulatory Visit (INDEPENDENT_AMBULATORY_CARE_PROVIDER_SITE_OTHER): Payer: Medicare Other | Admitting: Physical Therapy

## 2020-09-30 DIAGNOSIS — M25651 Stiffness of right hip, not elsewhere classified: Secondary | ICD-10-CM | POA: Diagnosis not present

## 2020-09-30 DIAGNOSIS — M25551 Pain in right hip: Secondary | ICD-10-CM | POA: Diagnosis not present

## 2020-09-30 DIAGNOSIS — M6281 Muscle weakness (generalized): Secondary | ICD-10-CM | POA: Diagnosis not present

## 2020-09-30 DIAGNOSIS — R262 Difficulty in walking, not elsewhere classified: Secondary | ICD-10-CM | POA: Diagnosis not present

## 2020-09-30 DIAGNOSIS — M25552 Pain in left hip: Secondary | ICD-10-CM | POA: Diagnosis not present

## 2020-09-30 DIAGNOSIS — M25652 Stiffness of left hip, not elsewhere classified: Secondary | ICD-10-CM

## 2020-09-30 DIAGNOSIS — R2681 Unsteadiness on feet: Secondary | ICD-10-CM

## 2020-09-30 LAB — CBC
HCT: 37.8 % (ref 35.0–45.0)
Hemoglobin: 12.6 g/dL (ref 11.7–15.5)
MCH: 33.4 pg — ABNORMAL HIGH (ref 27.0–33.0)
MCHC: 33.3 g/dL (ref 32.0–36.0)
MCV: 100.3 fL — ABNORMAL HIGH (ref 80.0–100.0)
MPV: 11.4 fL (ref 7.5–12.5)
Platelets: 180 10*3/uL (ref 140–400)
RBC: 3.77 10*6/uL — ABNORMAL LOW (ref 3.80–5.10)
RDW: 12.2 % (ref 11.0–15.0)
WBC: 3.7 10*3/uL — ABNORMAL LOW (ref 3.8–10.8)

## 2020-09-30 LAB — COMPREHENSIVE METABOLIC PANEL
AG Ratio: 1.9 (calc) (ref 1.0–2.5)
ALT: 66 U/L — ABNORMAL HIGH (ref 6–29)
AST: 42 U/L — ABNORMAL HIGH (ref 10–35)
Albumin: 3.5 g/dL — ABNORMAL LOW (ref 3.6–5.1)
Alkaline phosphatase (APISO): 85 U/L (ref 37–153)
BUN/Creatinine Ratio: 12 (calc) (ref 6–22)
BUN: 14 mg/dL (ref 7–25)
CO2: 24 mmol/L (ref 20–32)
Calcium: 8.6 mg/dL (ref 8.6–10.4)
Chloride: 106 mmol/L (ref 98–110)
Creat: 1.18 mg/dL — ABNORMAL HIGH (ref 0.50–0.99)
Globulin: 1.8 g/dL (calc) — ABNORMAL LOW (ref 1.9–3.7)
Glucose, Bld: 237 mg/dL — ABNORMAL HIGH (ref 65–99)
Potassium: 3.9 mmol/L (ref 3.5–5.3)
Sodium: 140 mmol/L (ref 135–146)
Total Bilirubin: 0.4 mg/dL (ref 0.2–1.2)
Total Protein: 5.3 g/dL — ABNORMAL LOW (ref 6.1–8.1)

## 2020-09-30 LAB — VITAMIN B12: Vitamin B-12: 946 pg/mL (ref 200–1100)

## 2020-09-30 LAB — PROTIME-INR
INR: 1
Prothrombin Time: 10.3 s (ref 9.0–11.5)

## 2020-09-30 NOTE — Therapy (Signed)
[facility] [place], [place], [address] Phone: [phone]   Fax:  [phone]  Physical Therapy Treatment  Patient Details  Name: Savannah [name] MRN: [id number] Date of Birth: [birth year] Referring Provider (PT): Savannah Barrack MD   Encounter Date: 09/30/2020   PT End of Session - 09/30/20 1041    Visit Number 3    Number of Visits 16    PT Start Time 1022    PT Stop Time 1100    PT Time Calculation (min) 38 min    Activity Tolerance No increased pain;Patient tolerated treatment well    Behavior During Therapy [facility] for tasks assessed/performed           Past Medical History:  Diagnosis Date  . ABDOMINAL PAIN, CHRONIC 10/20/2009  . Acute cystitis 08/17/2009  . ALLERGIC RHINITIS 11/21/2007  . Allergy   . ASYMPTOMATIC POSTMENOPAUSAL STATUS 09/29/2008  . Bariatric surgery status 07/07/2010  . Bariatric surgery status 07/07/2010   Qualifier: Diagnosis of  By: Savannah Drilling MD, Savannah [name]   . Cancer ([place])    uterine  . Cataract   . Coronary artery disease   . DEPRESSION 11/21/2007  . DIABETES MELLITUS, TYPE II 07/08/2007  . DYSPNEA 05/20/2009  . Edema 05/20/2009  . Eosinophilic esophagitis 07/18/2228  . FEVER UNSPECIFIED 10/20/2009  . FEVER, HX OF 03/09/2010  . GERD (gastroesophageal reflux disease)   . Headache(784.0) 02/06/2010  . Hepatomegaly 01/06/2008  . HYPERLIPIDEMIA 07/08/2007  . HYPERTENSION 07/08/2007  . LEUKOPENIA, MILD 09/29/2008  . OBESITY 01/06/2008  . OSTEOARTHRITIS 01/06/2008  . Other chronic nonalcoholic liver disease 7/98/9211  . PERIPHERAL NEUROPATHY 11/21/2007  . Peripheral neuropathy   . Postsurgical hypothyroidism 09/19/2010  . SINUSITIS- ACUTE-NOS 11/21/2007  . TB SKIN TEST, POSITIVE 02/06/2010  . THYROID NODULE 03/09/2010    Past Surgical History:  Procedure Laterality Date  . ABDOMINAL HYSTERECTOMY    . BASAL CELL CARCINOMA EXCISION    . CARDIAC CATHETERIZATION    . COLONOSCOPY    . EYE SURGERY Bilateral 08/2018, 09/2018   . FOOT SURGERY Left    10/2019  . KNEE ARTHROSCOPY Left   . LEFT HEART CATH AND CORONARY ANGIOGRAPHY N/A 01/20/2019   Procedure: LEFT HEART CATH AND CORONARY ANGIOGRAPHY;  Surgeon: Savannah Crome, MD;  Location: [facility];  Service: Cardiovascular;  Laterality: N/A;  . LYMPH NODE DISSECTION    . OOPHORECTOMY    . Right total knee replacement    . ROUX-EN-Y GASTRIC BYPASS  2011  . SKIN TAG REMOVAL    . THYROIDECTOMY    . TONSILLECTOMY AND ADENOIDECTOMY    . UPPER GASTROINTESTINAL ENDOSCOPY      There were no vitals filed for this visit.   Subjective Assessment - 09/30/20 1029    Subjective relays not a good day for pain, relays 5/10 pain everywhere today.    Pertinent History Previous L foot surgery, L knee scope, R knee TKA and gastric bypass with 150+ pound weight loss.    Patient Stated Goals Be able to sit and sleep without B hip pain and improve confidence with balance.    Pain Onset More than a month ago            [facility] Adult PT Treatment/Exercise - 09/30/20 0001      Neuro Re-ed    Neuro Re-ed Details  balance on foam beam for sidestepping and tandem walk in bars no UE support      Exercises   Exercises  Lumbar      Lumbar Exercises: Stretches   ITB Stretch --    Piriformis Stretch Right;Left;3 reps;30 seconds      Lumbar Exercises: Standing   Other Standing Lumbar Exercises rows and extensions red X 20, anti rotation X 15 reps bilat with red      Knee/Hip Exercises: Stretches   Press photographer Both;3 reps;30 seconds    Gastroc Stretch Limitations slantboard    Other Knee/Hip Stretches --      Knee/Hip Exercises: Aerobic   Nustep L3 X 6 min UE/LE seat 13      Knee/Hip Exercises: Machines for Strengthening   Total Gym Leg Press 62 lbs 3X10 reps      Knee/Hip Exercises: Standing   Forward Step Up Both;10 reps;Hand Hold: 1;Step Height: 6"      Knee/Hip Exercises: Seated   Sit to Sand without UE support   2X10 reps, 26 inch     Knee/Hip Exercises:  Supine   Bridges 2 sets;10 reps    Bridges Limitations hold 5 sec    Straight Leg Raises --      Knee/Hip Exercises: Sidelying   Clams 2 sets of 10 bilat      Modalities   Modalities Moist Heat      Moist Heat Therapy   Number Minutes Moist Heat 6 Minutes   while on Nu Step   Moist Heat Location Lumbar Spine                       PT Long Term Goals - 09/16/20 1646      PT LONG TERM GOAL #1   Title Improve FOTO score to 65.    Baseline 58    Time 8    Period Weeks    Status New    Target Date 11/11/20      PT LONG TERM GOAL #2   Title Savannah [name] will report B hip pain as consistently 0-3/10 on the Numeric Pain Rating Scale.    Baseline Can be 5+/10    Time 8    Period Weeks    Status New    Target Date 11/11/20      PT LONG TERM GOAL #3   Title Improve B hip AROM for flexion to 100 degrees; hamstrings to 45 degrees; IR to 10 degrees and ER to 45 degrees.    Baseline See objective.    Time 8    Period Weeks    Status New    Target Date 11/11/20      PT LONG TERM GOAL #4   Title Improve B hip strength and balance as assessed by self-report and functional scores.    Time 8    Period Weeks    Status New    Target Date 11/11/20      PT LONG TERM GOAL #5   Title Savannah [name] will be independent with her long-term HEP at [place].    Time 8    Period Weeks    Status New    Target Date 11/11/20                 Plan - 09/30/20 1105    Clinical Impression Statement Fair overall exercise tolerance today, balance was some better today. Continue POC    Personal Factors and Comorbidities Comorbidity 1    Comorbidities R TKA, L knee scope, nutritional deficits    Examination-Activity Limitations Sit;Sleep;Locomotion Level    Examination-Participation Restrictions Community Activity  Stability/Clinical Decision Making Evolving/Moderate complexity    Rehab Potential Good    PT Frequency 2x / week    PT Duration 8 weeks    PT Treatment/Interventions ADLs/Self Care  Home Management;Moist Heat;Cryotherapy;Therapeutic activities;Stair training;Gait training;Therapeutic exercise;Balance training;Neuromuscular re-education;Patient/family education;Manual techniques;Dry needling    PT Next Visit Plan Balance and strength progressions to complement hip stretching    PT Home Exercise Plan Access Code: [email or web address]    Consulted and Agree with Plan of Care Patient           Patient will benefit from skilled therapeutic intervention in order to improve the following deficits and impairments:  Abnormal gait, Decreased activity tolerance, Decreased balance, Decreased endurance, Decreased range of motion, Decreased strength, Difficulty walking, Hypomobility, Impaired flexibility, Pain  Visit Diagnosis: Difficulty walking  Muscle weakness (generalized)  Unsteady gait  Stiffness of left hip, not elsewhere classified  Stiffness of right hip, not elsewhere classified  Pain in right hip  Pain in left hip     Problem List Patient Active Problem List   Diagnosis Date Noted  . Iron deficiency anemia 03/14/2020  . Neutropenia ([place]) 02/29/2020  . Deficiency anemia 02/29/2020  . Lesion of vulva 09/02/2019  . Menorrhagia 09/02/2019  . Neuropathy 09/02/2019  . Abdominal pain, epigastric 06/25/2019  . Irregular bowel habits 06/25/2019  . Coronary artery disease involving native coronary artery of native heart without angina pectoris   . Temporomandibular joint disorder 12/05/2018  . Fatigue 12/05/2018  . Inverted nipple 12/05/2018  . Senile purpura ([place]) 11/21/2018  . GERD with esophagitis 11/21/2018  . Pseudogout involving multiple joints 11/21/2018  . Lipoma 07/22/2017  . Lichen sclerosus 38/18/2993  . H/O gastric bypass 11/16/2016  . GERD (gastroesophageal reflux disease) 11/16/2016  . Depression 11/16/2016  . Diabetic polyneuropathy associated with type 2 diabetes mellitus ([place]) 11/16/2016  . Hyperlipidemia 11/16/2016  . Left lower quadrant pain  11/16/2016  . Uterine cancer [facility]) s/p hysterectomy 1997 01/04/2012  . Postsurgical hypothyroidism 09/19/2010  . Generalized abdominal pain 10/20/2009  . Diabetic neuropathy ([place]) 11/21/2007  . Allergic rhinitis 11/21/2007  . Type 2 diabetes mellitus with diabetic neuropathy, unspecified ([place]) 07/08/2007  . Dyslipidemia associated with type 2 diabetes mellitus ([employer]) 07/08/2007    Silvestre Mesi 09/30/2020, 11:06 AM  [facility] Physical Therapy [address] [place], [place], [address] Phone: [phone]   Fax:  [phone]  Name: Savannah [name] MRN: [id number] Date of Birth: 05-31-1956

## 2020-09-30 NOTE — Progress Notes (Signed)
I, Savannah [name], LAT, ATC acting as a scribe for Savannah Leader, MD.  Subjective:    I'm seeing this patient as a consultation for Dr. Dimas [name]. Note will be routed back to referring provider/PCP.  CC: Left calf pain  HPI: Pt is a 64 yo female that c/o chronic L calf pn. Pt developed a spot on medial proximal calf about 4 months ago, it was discolored and turned from dark echimosis to brown-ish.  2 weeks ago, pt then developed  a 2-inch bruise appeared below the spot. Pt notes both areas are slightly tender. On 09/27/20, pt went for a walk, about 5 miles, when she returned home she had discolorations along medial aspect of foot/heel. Pt is active and does walk regularlly (~3 miles). Pt has a hx of chronic L foot pain related to recent heel fracture and leg length discrepancy.  Pt has neropathy, so numbness/tingling is difficult to identify.  L calf swelling: always swelling in lower leg, could be related to lympthodema Treatments tried: Rectocare cream  Dx imaging: 05/04/20 Hip/pelvis XR          03/14/20 L foot XR  Past medical history, Surgical history, Family history, Social history, Allergies, and medications have been entered into the medical record, reviewed. History radical hysterectomy due to endometrial cancer with resulting mild leg lymphedema. Additionally history of calcaneus fracture last year after surgical plantar fascial surgery Diabetes and neuropathy.  Review of Systems: No new headache, visual changes, nausea, vomiting, diarrhea, constipation, dizziness, abdominal pain, skin rash, fevers, chills, night sweats, weight loss, swollen lymph nodes, body aches, joint swelling, muscle aches, chest pain, shortness of breath, mood changes, visual or auditory hallucinations.   Objective:    Vitals:   10/03/20 1520  BP: 94/68  Pulse: 73  SpO2: 98%   General: Well Developed, well nourished, and in no acute distress.  Neuro/Psych: Alert and oriented x3, extra-ocular muscles  intact, able to move all 4 extremities, sensation grossly intact. Skin: Warm and dry, no rashes noted.  Respiratory: Not using accessory muscles, speaking in full sentences, trachea midline.  Cardiovascular: Pulses palpable, no extremity edema. Abdomen: Does not appear distended. MSK: Left leg 1+ edema.  Some bruising along medial lower leg extending to the medial ankle where more bruising is present. Mildly tender palpation medial calf. Left ankle and foot abnormal shape of calcaneus more flattened than would be typical. Significant pronation present with standing. Bruising present along inferior to medial malleolus. Tender palpation in inferior to medial malleolus mildly. Normal ankle motion. Intact strength.  Lab and Radiology Results  X-ray images left foot obtained Mar 14 2020 personally independently interpreted today Healing calcaneus fracture.  Abnormal shape more flattened than would be typical.  Diagnostic Limited MSK Ultrasound of: Left leg and medial ankle and foot Area of bruising at medial calf visualized.  Normal appearance of medial musculature with no visible tear or bone change. Edema present subcutaneous tissue lower leg. Medial ankle intact normal-appearing posterior tibialis tendon.  Hypoechoic fluid tracks within tendon sheath.  Tendon intact from medial malleolus to insertion site at medial midfoot Impression: Lower extremity edema and posterior tibialis tendinopathy without obvious tear    Impression and Recommendations:    Assessment and Plan: 64 y.o. female with left ankle pain and bruising.  Patient likely had a injury or partial tear of the muscle portion of the posterior tibialis at the medial calf and possibly injured the posterior tibialis tendon or the bruising simply tracked far enough distally  to be visible recently.  This all occurs in the setting of a calcaneus fracture earlier this year and some mild lymphedema from abdominal surgery years  ago.  Discussed options.  Plan for compressive ankle sleeve, and referral to physical therapy to address the posterior tibialis dysfunction.  Also she would benefit from custom orthotics due to have an abnormal foot shape, leg length discrepancy, and significant pronation.  [facility]  Recheck back in 1 to 2 months.  Return sooner if needed.[name]  PDMP not reviewed this encounter. Orders Placed This Encounter  Procedures  . [place] LIMITED JOINT SPACE STRUCTURES LOW LEFT(NO LINKED CHARGES)    Standing Status:   Future    Number of Occurrences:   1    Standing Expiration Date:   04/02/2021    Order Specific Question:   Reason for Exam (SYMPTOM  OR DIAGNOSIS REQUIRED)    Answer:   Chronic lower leg pain    Order Specific Question:   Preferred imaging location?    Answer:   [place]  . Ambulatory referral to Physical Therapy    Referral Priority:   Routine    Referral Type:   Physical Medicine    Referral Reason:   Specialty Services Required    Requested Specialty:   Physical Therapy   Meds ordered this encounter  Medications  . AMBULATORY NON FORMULARY MEDICATION    Sig: Administrator at [employer]  Correct leg length and improve pronation BL    Dispense:  1 each    Refill:  0    Discussed warning signs or symptoms. Please see discharge instructions. Patient expresses understanding.   The above documentation has been reviewed and is accurate and complete Savannah [name], M.D.

## 2020-10-03 ENCOUNTER — Ambulatory Visit (INDEPENDENT_AMBULATORY_CARE_PROVIDER_SITE_OTHER): Payer: Medicare Other | Admitting: Physical Therapy

## 2020-10-03 ENCOUNTER — Ambulatory Visit: Payer: Self-pay

## 2020-10-03 ENCOUNTER — Ambulatory Visit (INDEPENDENT_AMBULATORY_CARE_PROVIDER_SITE_OTHER): Payer: Medicare Other | Admitting: Family Medicine

## 2020-10-03 ENCOUNTER — Encounter: Payer: Self-pay | Admitting: Family Medicine

## 2020-10-03 ENCOUNTER — Other Ambulatory Visit: Payer: Self-pay

## 2020-10-03 ENCOUNTER — Encounter: Payer: Self-pay | Admitting: Physical Therapy

## 2020-10-03 VITALS — BP 94/68 | HR 73 | Ht 71.0 in | Wt 166.6 lb

## 2020-10-03 DIAGNOSIS — M79662 Pain in left lower leg: Secondary | ICD-10-CM | POA: Diagnosis not present

## 2020-10-03 DIAGNOSIS — M25551 Pain in right hip: Secondary | ICD-10-CM

## 2020-10-03 DIAGNOSIS — R262 Difficulty in walking, not elsewhere classified: Secondary | ICD-10-CM | POA: Diagnosis not present

## 2020-10-03 DIAGNOSIS — S92002S Unspecified fracture of left calcaneus, sequela: Secondary | ICD-10-CM

## 2020-10-03 DIAGNOSIS — M25552 Pain in left hip: Secondary | ICD-10-CM

## 2020-10-03 DIAGNOSIS — I890 Lymphedema, not elsewhere classified: Secondary | ICD-10-CM

## 2020-10-03 DIAGNOSIS — M25651 Stiffness of right hip, not elsewhere classified: Secondary | ICD-10-CM

## 2020-10-03 DIAGNOSIS — M25652 Stiffness of left hip, not elsewhere classified: Secondary | ICD-10-CM | POA: Diagnosis not present

## 2020-10-03 DIAGNOSIS — R2681 Unsteadiness on feet: Secondary | ICD-10-CM

## 2020-10-03 DIAGNOSIS — I2510 Atherosclerotic heart disease of native coronary artery without angina pectoris: Secondary | ICD-10-CM | POA: Diagnosis not present

## 2020-10-03 DIAGNOSIS — M76822 Posterior tibial tendinitis, left leg: Secondary | ICD-10-CM | POA: Diagnosis not present

## 2020-10-03 DIAGNOSIS — M6281 Muscle weakness (generalized): Secondary | ICD-10-CM | POA: Diagnosis not present

## 2020-10-03 MED ORDER — AMBULATORY NON FORMULARY MEDICATION: 0 refills | Status: DC

## 2020-10-03 NOTE — Patient Instructions (Addendum)
Thank you for coming in today.  I recommend you obtained a compression sleeve to help with your joint problems. There are many options on the market however I recommend obtaining a full ankle Body Helix compression sleeve.  You can find information (including how to appropriate measure yourself for sizing) can be found at www.Body http://www.lambert.com/.  Many of these products are health savings account (HSA) eligible.   You can use the compression sleeve at any time throughout the day but is most important to use while being active as well as for 2 hours post-activity.   It is appropriate to ice following activity with the compression sleeve in place.  Try the scaphoid pads (large from [employer]).   Please use voltaren gel up to 4x daily for pain as needed.   Recheck in 4-6 weeks especially if not better.   Let me know if you want referral to [facility] specilist or custom orthotics.   [facility] [place]  [phone]

## 2020-10-03 NOTE — Progress Notes (Signed)
Please inform patient of the following:  Liver numbers are up but all of her other labs are within expected ranges. Do not have any obvious explanation for her easy bruising. I would like for her to follow up with sports medicine to look at her potential calf tear as we discussed. I would also like for her to come back in a week or so to recheck liver numbers. Please place future order for [facility].

## 2020-10-03 NOTE — Therapy (Signed)
[facility] [address] [place], [place], [address] Phone: [phone]   Fax:  [phone]  Physical Therapy Treatment  Patient Details  Name: Savannah [name] MRN: [id number] Date of Birth: 1956/01/30 Referring Provider (PT): Vivi Barrack MD   Encounter Date: 10/03/2020   PT End of Session - 10/03/20 1348    Visit Number 4    Number of Visits 16    PT Start Time 1300    PT Stop Time 3149    PT Time Calculation (min) 48 min    Activity Tolerance No increased pain;Patient tolerated treatment well    Behavior During Therapy [facility] for tasks assessed/performed           Past Medical History:  Diagnosis Date  . ABDOMINAL PAIN, CHRONIC 10/20/2009  . Acute cystitis 08/17/2009  . ALLERGIC RHINITIS 11/21/2007  . Allergy   . ASYMPTOMATIC POSTMENOPAUSAL STATUS 09/29/2008  . Bariatric surgery status 07/07/2010  . Bariatric surgery status 07/07/2010   Qualifier: Diagnosis of  By: Loanne Drilling MD, Jacelyn Pi   . Cancer ([place])    uterine  . Cataract   . Coronary artery disease   . DEPRESSION 11/21/2007  . DIABETES MELLITUS, TYPE II 07/08/2007  . DYSPNEA 05/20/2009  . Edema 05/20/2009  . Eosinophilic esophagitis 7/0/2637  . FEVER UNSPECIFIED 10/20/2009  . FEVER, HX OF 03/09/2010  . GERD (gastroesophageal reflux disease)   . Headache(784.0) 02/06/2010  . Hepatomegaly 01/06/2008  . HYPERLIPIDEMIA 07/08/2007  . HYPERTENSION 07/08/2007  . LEUKOPENIA, MILD 09/29/2008  . OBESITY 01/06/2008  . OSTEOARTHRITIS 01/06/2008  . Other chronic nonalcoholic liver disease 8/58/8502  . PERIPHERAL NEUROPATHY 11/21/2007  . Peripheral neuropathy   . Postsurgical hypothyroidism 09/19/2010  . SINUSITIS- ACUTE-NOS 11/21/2007  . TB SKIN TEST, POSITIVE 02/06/2010  . THYROID NODULE 03/09/2010    Past Surgical History:  Procedure Laterality Date  . ABDOMINAL HYSTERECTOMY    . BASAL CELL CARCINOMA EXCISION    . CARDIAC CATHETERIZATION    . COLONOSCOPY    . EYE SURGERY Bilateral 08/2018, 09/2018   . FOOT SURGERY Left    10/2019  . KNEE ARTHROSCOPY Left   . LEFT HEART CATH AND CORONARY ANGIOGRAPHY N/A 01/20/2019   Procedure: LEFT HEART CATH AND CORONARY ANGIOGRAPHY;  Surgeon: Belva Crome, MD;  Location: [facility];  Service: Cardiovascular;  Laterality: N/A;  . LYMPH NODE DISSECTION    . OOPHORECTOMY    . Right total knee replacement    . ROUX-EN-Y GASTRIC BYPASS  2011  . SKIN TAG REMOVAL    . THYROIDECTOMY    . TONSILLECTOMY AND ADENOIDECTOMY    . UPPER GASTROINTESTINAL ENDOSCOPY      There were no vitals filed for this visit.   Subjective Assessment - 10/03/20 1309    Subjective relays pain is so so today. She does feel like she can step up onto a curb better now    Pertinent History Previous L foot surgery, L knee scope, R knee TKA and gastric bypass with 150+ pound weight loss.    Patient Stated Goals Be able to sit and sleep without B hip pain and improve confidence with balance.    Pain Onset More than a month ago              [facility] PT Assessment - 10/03/20 0001      Strength   Right Hip Flexion 3+/5    Right Hip ABduction 3+/5    Left Hip Flexion 3+/5  Left Hip ABduction 3+/5            [facility] Adult PT Treatment/Exercise - 10/03/20 0001      Neuro Re-ed    Neuro Re-ed Details  balance on airex pad for tandem balance and then feet together with eyes closed      Lumbar Exercises: Standing   Other Standing Lumbar Exercises rows and extensions red X 20, anti rotation X 20 reps bilat with red, standing alt chest press  X15 bilat red    Other Standing Lumbar Exercises sidestepping with green band around knees at counter top up/down X 5 reps      Knee/Hip Exercises: Stretches   Piriformis Stretch Right;Left;2 reps;30 seconds    Gastroc Stretch Both;3 reps;30 seconds    Gastroc Stretch Limitations slantboard      Knee/Hip Exercises: Aerobic   Nustep L3 X 6 min UE/LE seat 13      Knee/Hip Exercises: Machines for Strengthening   Cybex Knee  Extension 5 lbs bilat push 2 sets of 10, then set of 5    Total Gym Leg Press 62 lbs 3X10 reps      Knee/Hip Exercises: Standing   Heel Raises Limitations heel and toe raises X 15 reps ea with UE support    Forward Step Up Both;15 reps;Step Height: 6";Hand Hold: 0      Knee/Hip Exercises: Seated   Sit to Sand without UE support;3 sets;5 reps   chair with airex pad     Moist Heat Therapy   Number Minutes Moist Heat 7 Minutes    Moist Heat Location Lumbar Spine                       PT Long Term Goals - 09/16/20 1646      PT LONG TERM GOAL #1   Title Improve FOTO score to 65.    Baseline 58    Time 8    Period Weeks    Status New    Target Date 11/11/20      PT LONG TERM GOAL #2   Title Savannah [name] will report B hip pain as consistently 0-3/10 on the Numeric Pain Rating Scale.    Baseline Can be 5+/10    Time 8    Period Weeks    Status New    Target Date 11/11/20      PT LONG TERM GOAL #3   Title Improve B hip AROM for flexion to 100 degrees; hamstrings to 45 degrees; IR to 10 degrees and ER to 45 degrees.    Baseline See objective.    Time 8    Period Weeks    Status New    Target Date 11/11/20      PT LONG TERM GOAL #4   Title Improve B hip strength and balance as assessed by self-report and functional scores.    Time 8    Period Weeks    Status New    Target Date 11/11/20      PT LONG TERM GOAL #5   Title Savannah [name] will be independent with her long-term HEP at [facility].    Time 8    Period Weeks    Status New    Target Date 11/11/20                 Plan - 10/03/20 1348    Clinical Impression Statement strength improving some with updated measurements but still with deficits and will continue to benefit from  PT with focus on strength.    Personal Factors and Comorbidities Comorbidity 1    Comorbidities R TKA, L knee scope, nutritional deficits    Examination-Activity Limitations Sit;Sleep;Locomotion Level    Examination-Participation Restrictions  Community Activity    Stability/Clinical Decision Making Evolving/Moderate complexity    Rehab Potential Good    PT Frequency 2x / week    PT Duration 8 weeks    PT Treatment/Interventions ADLs/Self Care Home Management;Moist Heat;Cryotherapy;Therapeutic activities;Stair training;Gait training;Therapeutic exercise;Balance training;Neuromuscular re-education;Patient/family education;Manual techniques;Dry needling    PT Next Visit Plan Balance and strength progressions to complement hip stretching    PT Home Exercise Plan Access Code: [email or web address]    Consulted and Agree with Plan of Care Patient           Patient will benefit from skilled therapeutic intervention in order to improve the following deficits and impairments:  Abnormal gait, Decreased activity tolerance, Decreased balance, Decreased endurance, Decreased range of motion, Decreased strength, Difficulty walking, Hypomobility, Impaired flexibility, Pain  Visit Diagnosis: Difficulty walking  Muscle weakness (generalized)  Unsteady gait  Stiffness of left hip, not elsewhere classified  Stiffness of right hip, not elsewhere classified  Pain in right hip  Pain in left hip     Problem List Patient Active Problem List   Diagnosis Date Noted  . Iron deficiency anemia 03/14/2020  . Neutropenia ([place]) 02/29/2020  . Deficiency anemia 02/29/2020  . Lesion of vulva 09/02/2019  . Menorrhagia 09/02/2019  . Neuropathy 09/02/2019  . Abdominal pain, epigastric 06/25/2019  . Irregular bowel habits 06/25/2019  . Coronary artery disease involving native coronary artery of native heart without angina pectoris   . Temporomandibular joint disorder 12/05/2018  . Fatigue 12/05/2018  . Inverted nipple 12/05/2018  . Senile purpura ([place]) 11/21/2018  . GERD with esophagitis 11/21/2018  . Pseudogout involving multiple joints 11/21/2018  . Lipoma 07/22/2017  . Lichen sclerosus 36/46/8032  . H/O gastric bypass 11/16/2016  . GERD  (gastroesophageal reflux disease) 11/16/2016  . Depression 11/16/2016  . Diabetic polyneuropathy associated with type 2 diabetes mellitus ([place]) 11/16/2016  . Hyperlipidemia 11/16/2016  . Left lower quadrant pain 11/16/2016  . Uterine cancer [facility]) s/p hysterectomy 1997 01/04/2012  . Postsurgical hypothyroidism 09/19/2010  . Generalized abdominal pain 10/20/2009  . Diabetic neuropathy ([place]) 11/21/2007  . Allergic rhinitis 11/21/2007  . Type 2 diabetes mellitus with diabetic neuropathy, unspecified ([place]) 07/08/2007  . Dyslipidemia associated with type 2 diabetes mellitus ([place]) 07/08/2007    Savannah [name] 10/03/2020, 1:49 PM  [facility] [address] [place], [place], [address] Phone: [phone]   Fax:  [phone]  Name: Savannah [name] MRN: [id number] Date of Birth: [birth year]

## 2020-10-04 ENCOUNTER — Other Ambulatory Visit: Payer: Self-pay

## 2020-10-04 DIAGNOSIS — R7989 Other specified abnormal findings of blood chemistry: Secondary | ICD-10-CM

## 2020-10-05 ENCOUNTER — Ambulatory Visit (INDEPENDENT_AMBULATORY_CARE_PROVIDER_SITE_OTHER): Payer: Medicare Other | Admitting: Physical Therapy

## 2020-10-05 ENCOUNTER — Other Ambulatory Visit: Payer: Self-pay

## 2020-10-05 DIAGNOSIS — M25551 Pain in right hip: Secondary | ICD-10-CM | POA: Diagnosis not present

## 2020-10-05 DIAGNOSIS — R262 Difficulty in walking, not elsewhere classified: Secondary | ICD-10-CM

## 2020-10-05 DIAGNOSIS — R2681 Unsteadiness on feet: Secondary | ICD-10-CM

## 2020-10-05 DIAGNOSIS — M79662 Pain in left lower leg: Secondary | ICD-10-CM | POA: Diagnosis not present

## 2020-10-05 DIAGNOSIS — M25651 Stiffness of right hip, not elsewhere classified: Secondary | ICD-10-CM | POA: Diagnosis not present

## 2020-10-05 DIAGNOSIS — M25552 Pain in left hip: Secondary | ICD-10-CM

## 2020-10-05 DIAGNOSIS — M6281 Muscle weakness (generalized): Secondary | ICD-10-CM | POA: Diagnosis not present

## 2020-10-05 DIAGNOSIS — M25652 Stiffness of left hip, not elsewhere classified: Secondary | ICD-10-CM

## 2020-10-05 NOTE — Therapy (Signed)
[facility] [address] [place], [place], [address] Phone: [phone]   Fax:  [phone]  Physical Therapy Treatment  Patient Details  Name: Savannah [name] MRN: [id number] Date of Birth: [birth year] Referring Provider (PT): Savannah Barrack MD PCP, Savannah [name] ortho   Encounter Date: 10/05/2020   PT End of Session - 10/05/20 1200    Visit Number 5    Number of Visits 16    PT Start Time 3716    PT Stop Time 9678    PT Time Calculation (min) 38 min    Activity Tolerance No increased pain;Patient tolerated treatment well    Behavior During Therapy [facility] for tasks assessed/performed           Past Medical History:  Diagnosis Date  . ABDOMINAL PAIN, CHRONIC 10/20/2009  . Acute cystitis 08/17/2009  . ALLERGIC RHINITIS 11/21/2007  . Allergy   . ASYMPTOMATIC POSTMENOPAUSAL STATUS 09/29/2008  . Bariatric surgery status 07/07/2010  . Bariatric surgery status 07/07/2010   Qualifier: Diagnosis of  By: Savannah Drilling MD, Savannah [name]   . Cancer ([place])    uterine  . Cataract   . Coronary artery disease   . DEPRESSION 11/21/2007  . DIABETES MELLITUS, TYPE II 07/08/2007  . DYSPNEA 05/20/2009  . Edema 05/20/2009  . Eosinophilic esophagitis 07/15/8100  . FEVER UNSPECIFIED 10/20/2009  . FEVER, HX OF 03/09/2010  . GERD (gastroesophageal reflux disease)   . Headache(784.0) 02/06/2010  . Hepatomegaly 01/06/2008  . HYPERLIPIDEMIA 07/08/2007  . HYPERTENSION 07/08/2007  . LEUKOPENIA, MILD 09/29/2008  . OBESITY 01/06/2008  . OSTEOARTHRITIS 01/06/2008  . Other chronic nonalcoholic liver disease 7/51/0258  . PERIPHERAL NEUROPATHY 11/21/2007  . Peripheral neuropathy   . Postsurgical hypothyroidism 09/19/2010  . SINUSITIS- ACUTE-NOS 11/21/2007  . TB SKIN TEST, POSITIVE 02/06/2010  . THYROID NODULE 03/09/2010    Past Surgical History:  Procedure Laterality Date  . ABDOMINAL HYSTERECTOMY    . BASAL CELL CARCINOMA EXCISION    . CARDIAC CATHETERIZATION    . COLONOSCOPY    . EYE SURGERY Bilateral  08/2018, 09/2018  . FOOT SURGERY Left    10/2019  . KNEE ARTHROSCOPY Left   . LEFT HEART CATH AND CORONARY ANGIOGRAPHY N/A 01/20/2019   Procedure: LEFT HEART CATH AND CORONARY ANGIOGRAPHY;  Surgeon: Savannah Crome, MD;  Location: [facility];  Service: Cardiovascular;  Laterality: N/A;  . LYMPH NODE DISSECTION    . OOPHORECTOMY    . Right total knee replacement    . ROUX-EN-Y GASTRIC BYPASS  2011  . SKIN TAG REMOVAL    . THYROIDECTOMY    . TONSILLECTOMY AND ADENOIDECTOMY    . UPPER GASTROINTESTINAL ENDOSCOPY      There were no vitals filed for this visit.   Subjective Assessment - 10/05/20 1114    Subjective relays the pain is more so in the back today, everything else is doing okay.              [facility] PT Assessment - 10/05/20 0001      Assessment   Medical Diagnosis Lt posterior tibialis tendonopathy, B hip trochanteric bursitis, balance impairments, L knee OA    Referring Provider (PT) Savannah Barrack MD PCP, Savannah [name] ortho      AROM   AROM Assessment Site Ankle    Right/Left Hip Left    Right/Left Ankle Left    Left Ankle Dorsiflexion 5    Left Ankle Plantar Flexion --   WNL   Left Ankle Inversion --  WNL   Left Ankle Eversion --   17     Strength   Strength Assessment Site Ankle    Right/Left Hip --    Right/Left Ankle Left    Left Ankle Dorsiflexion 4+/5    Left Ankle Plantar Flexion 4+/5    Left Ankle Inversion 4-/5    Left Ankle Eversion 4-/5                         [facility] Adult PT Treatment/Exercise - 10/05/20 0001      Neuro Re-ed    Neuro Re-ed Details  rocker board balance X 10 reps A-P and 10 reps latreal, tandem balance, balance on BOSU ball      Lumbar Exercises: Standing   Other Standing Lumbar Exercises rows and extensions green X 20, anti rotation X 15 reps bilat green X 20    Other Standing Lumbar Exercises sidestepping with green band around knees at counter top up/down X 5 reps      Knee/Hip Exercises: Stretches    Piriformis Stretch Right;Left;2 reps;30 seconds    Gastroc Stretch Both;3 reps;30 seconds    Gastroc Stretch Limitations slantboard      Knee/Hip Exercises: Aerobic   Nustep L3 X 6 min UE/LE seat 13      Knee/Hip Exercises: Machines for Strengthening   Cybex Knee Extension --    Total Gym Leg Press --      Knee/Hip Exercises: Standing   Heel Raises Limitations heel and toe raises X 15 reps ea with UE support    Forward Step Up Both;15 reps;Step Height: 6";Hand Hold: 0      Knee/Hip Exercises: Seated   Sit to Sand without UE support;3 sets;5 reps      Knee/Hip Exercises: Supine   Bridges 2 sets;10 reps    Bridges Limitations hold 5 sec    Other Supine Knee/Hip Exercises clamshells green 2 sets of 15                       PT Long Term Goals - 09/16/20 1646      PT LONG TERM GOAL #1   Title Improve FOTO score to 65.    Baseline 58    Time 8    Period Weeks    Status New    Target Date 11/11/20      PT LONG TERM GOAL #2   Title Savannah [name] will report B hip pain as consistently 0-3/10 on the Numeric Pain Rating Scale.    Baseline Can be 5+/10    Time 8    Period Weeks    Status New    Target Date 11/11/20      PT LONG TERM GOAL #3   Title Improve B hip AROM for flexion to 100 degrees; hamstrings to 45 degrees; IR to 10 degrees and ER to 45 degrees.    Baseline See objective.    Time 8    Period Weeks    Status New    Target Date 11/11/20      PT LONG TERM GOAL #4   Title Improve B hip strength and balance as assessed by self-report and functional scores.    Time 8    Period Weeks    Status New    Target Date 11/11/20      PT LONG TERM GOAL #5   Title Savannah [name] will be independent with her long-term HEP at DC.    Time  8    Period Weeks    Status New    Target Date 11/11/20                 Plan - 10/05/20 1201    Clinical Impression Statement She has new referral for PT for Lt posterior tib tendonopathy. Her Lt ankle was assessed and will be added  into POC today. She does have overall decreased strength and ROM in left ankle with increased edema and will benefit from skilled PT for this.    Personal Factors and Comorbidities Comorbidity 1    Comorbidities R TKA, L knee scope, nutritional deficits    Examination-Activity Limitations Sit;Sleep;Locomotion Level    Examination-Participation Restrictions Community Activity    Stability/Clinical Decision Making Evolving/Moderate complexity    Rehab Potential Good    PT Frequency 2x / week    PT Duration 8 weeks    PT Treatment/Interventions ADLs/Self Care Home Management;Moist Heat;Cryotherapy;Therapeutic activities;Stair training;Gait training;Therapeutic exercise;Balance training;Neuromuscular re-education;Patient/family education;Manual techniques;Dry needling    PT Next Visit Plan Balance and strength progressions to complement hip stretching    PT Home Exercise Plan Access Code: [email or web address]    Consulted and Agree with Plan of Care Patient           Patient will benefit from skilled therapeutic intervention in order to improve the following deficits and impairments:  Abnormal gait, Decreased activity tolerance, Decreased balance, Decreased endurance, Decreased range of motion, Decreased strength, Difficulty walking, Hypomobility, Impaired flexibility, Pain  Visit Diagnosis: Difficulty walking  Pain in left lower leg  Muscle weakness (generalized)  Unsteady gait  Stiffness of left hip, not elsewhere classified  Stiffness of right hip, not elsewhere classified  Pain in right hip  Pain in left hip     Problem List Patient Active Problem List   Diagnosis Date Noted  . Iron deficiency anemia 03/14/2020  . Neutropenia ([place]) 02/29/2020  . Deficiency anemia 02/29/2020  . Lesion of vulva 09/02/2019  . Menorrhagia 09/02/2019  . Neuropathy 09/02/2019  . Abdominal pain, epigastric 06/25/2019  . Irregular bowel habits 06/25/2019  . Coronary artery disease involving native  coronary artery of native heart without angina pectoris   . Temporomandibular joint disorder 12/05/2018  . Fatigue 12/05/2018  . Inverted nipple 12/05/2018  . Senile purpura ([place]) 11/21/2018  . GERD with esophagitis 11/21/2018  . Pseudogout involving multiple joints 11/21/2018  . Lipoma 07/22/2017  . Lichen sclerosus 62/83/1517  . H/O gastric bypass 11/16/2016  . GERD (gastroesophageal reflux disease) 11/16/2016  . Depression 11/16/2016  . Diabetic polyneuropathy associated with type 2 diabetes mellitus ([place]) 11/16/2016  . Hyperlipidemia 11/16/2016  . Left lower quadrant pain 11/16/2016  . Uterine cancer [facility]) s/p hysterectomy 1997 01/04/2012  . Postsurgical hypothyroidism 09/19/2010  . Generalized abdominal pain 10/20/2009  . Diabetic neuropathy ([place]) 11/21/2007  . Allergic rhinitis 11/21/2007  . Type 2 diabetes mellitus with diabetic neuropathy, unspecified ([place]) 07/08/2007  . Dyslipidemia associated with type 2 diabetes mellitus ([place]) 07/08/2007    Debbe Odea ,PT,DPT 10/05/2020, 12:02 PM  [facility] [address] [place], [place], [address] Phone: [phone]   Fax:  [phone]  Name: Savannah [name] MRN: [id number] Date of Birth: February 01, 1956

## 2020-10-10 ENCOUNTER — Other Ambulatory Visit: Payer: Medicare Other

## 2020-10-11 ENCOUNTER — Other Ambulatory Visit: Payer: Self-pay

## 2020-10-11 ENCOUNTER — Other Ambulatory Visit: Payer: Medicare Other

## 2020-10-11 DIAGNOSIS — R7989 Other specified abnormal findings of blood chemistry: Secondary | ICD-10-CM

## 2020-10-12 ENCOUNTER — Ambulatory Visit (INDEPENDENT_AMBULATORY_CARE_PROVIDER_SITE_OTHER): Payer: Medicare Other | Admitting: Rehabilitative and Restorative Service Providers"

## 2020-10-12 ENCOUNTER — Other Ambulatory Visit: Payer: Self-pay

## 2020-10-12 ENCOUNTER — Encounter: Payer: Self-pay | Admitting: Rehabilitative and Restorative Service Providers"

## 2020-10-12 ENCOUNTER — Other Ambulatory Visit: Payer: Self-pay | Admitting: *Deleted

## 2020-10-12 DIAGNOSIS — R899 Unspecified abnormal finding in specimens from other organs, systems and tissues: Secondary | ICD-10-CM

## 2020-10-12 DIAGNOSIS — M79662 Pain in left lower leg: Secondary | ICD-10-CM

## 2020-10-12 DIAGNOSIS — M6281 Muscle weakness (generalized): Secondary | ICD-10-CM | POA: Diagnosis not present

## 2020-10-12 DIAGNOSIS — M25651 Stiffness of right hip, not elsewhere classified: Secondary | ICD-10-CM | POA: Diagnosis not present

## 2020-10-12 DIAGNOSIS — M25552 Pain in left hip: Secondary | ICD-10-CM

## 2020-10-12 DIAGNOSIS — R262 Difficulty in walking, not elsewhere classified: Secondary | ICD-10-CM

## 2020-10-12 DIAGNOSIS — M25551 Pain in right hip: Secondary | ICD-10-CM | POA: Diagnosis not present

## 2020-10-12 DIAGNOSIS — M25652 Stiffness of left hip, not elsewhere classified: Secondary | ICD-10-CM | POA: Diagnosis not present

## 2020-10-12 LAB — COMPREHENSIVE METABOLIC PANEL
AG Ratio: 2.1 (calc) (ref 1.0–2.5)
ALT: 66 U/L — ABNORMAL HIGH (ref 6–29)
AST: 49 U/L — ABNORMAL HIGH (ref 10–35)
Albumin: 3.8 g/dL (ref 3.6–5.1)
Alkaline phosphatase (APISO): 99 U/L (ref 37–153)
BUN: 15 mg/dL (ref 7–25)
CO2: 25 mmol/L (ref 20–32)
Calcium: 8.8 mg/dL (ref 8.6–10.4)
Chloride: 107 mmol/L (ref 98–110)
Creat: 0.94 mg/dL (ref 0.50–0.99)
Globulin: 1.8 g/dL (calc) — ABNORMAL LOW (ref 1.9–3.7)
Glucose, Bld: 119 mg/dL — ABNORMAL HIGH (ref 65–99)
Potassium: 4.2 mmol/L (ref 3.5–5.3)
Sodium: 142 mmol/L (ref 135–146)
Total Bilirubin: 0.4 mg/dL (ref 0.2–1.2)
Total Protein: 5.6 g/dL — ABNORMAL LOW (ref 6.1–8.1)

## 2020-10-12 NOTE — Progress Notes (Signed)
Please inform patient of the following:  Still has elevated liver numbers but everything else is stable.  Recommend checking liver ultrasound.  Please place order for right upper quadrant ultrasound.

## 2020-10-12 NOTE — Therapy (Signed)
[facility] [address] [place], [place], [address] Phone: [phone]   Fax:  [phone]  Physical Therapy Treatment  Patient Details  Name: Savannah [name] MRN: [id number] Date of Birth: 02-04-1956 Referring Provider (PT): Vivi Barrack MD PCP, Marylin Crosby   Encounter Date: 10/12/2020   PT End of Session - 10/12/20 1712    Visit Number 6    Number of Visits 16    PT Start Time 1526    PT Stop Time 5027    PT Time Calculation (min) 41 min    Activity Tolerance No increased pain;Patient tolerated treatment well    Behavior During Therapy [facility] for tasks assessed/performed           Past Medical History:  Diagnosis Date  . ABDOMINAL PAIN, CHRONIC 10/20/2009  . Acute cystitis 08/17/2009  . ALLERGIC RHINITIS 11/21/2007  . Allergy   . ASYMPTOMATIC POSTMENOPAUSAL STATUS 09/29/2008  . Bariatric surgery status 07/07/2010  . Bariatric surgery status 07/07/2010   Qualifier: Diagnosis of  By: Loanne Drilling MD, Jacelyn Pi   . Cancer ([place])    uterine  . Cataract   . Coronary artery disease   . DEPRESSION 11/21/2007  . DIABETES MELLITUS, TYPE II 07/08/2007  . DYSPNEA 05/20/2009  . Edema 05/20/2009  . Eosinophilic esophagitis 05/15/1286  . FEVER UNSPECIFIED 10/20/2009  . FEVER, HX OF 03/09/2010  . GERD (gastroesophageal reflux disease)   . Headache(784.0) 02/06/2010  . Hepatomegaly 01/06/2008  . HYPERLIPIDEMIA 07/08/2007  . HYPERTENSION 07/08/2007  . LEUKOPENIA, MILD 09/29/2008  . OBESITY 01/06/2008  . OSTEOARTHRITIS 01/06/2008  . Other chronic nonalcoholic liver disease 8/67/6720  . PERIPHERAL NEUROPATHY 11/21/2007  . Peripheral neuropathy   . Postsurgical hypothyroidism 09/19/2010  . SINUSITIS- ACUTE-NOS 11/21/2007  . TB SKIN TEST, POSITIVE 02/06/2010  . THYROID NODULE 03/09/2010    Past Surgical History:  Procedure Laterality Date  . ABDOMINAL HYSTERECTOMY    . BASAL CELL CARCINOMA EXCISION    . CARDIAC CATHETERIZATION    . COLONOSCOPY    . EYE SURGERY Bilateral  08/2018, 09/2018  . FOOT SURGERY Left    10/2019  . KNEE ARTHROSCOPY Left   . LEFT HEART CATH AND CORONARY ANGIOGRAPHY N/A 01/20/2019   Procedure: LEFT HEART CATH AND CORONARY ANGIOGRAPHY;  Surgeon: Belva Crome, MD;  Location: [facility];  Service: Cardiovascular;  Laterality: N/A;  . LYMPH NODE DISSECTION    . OOPHORECTOMY    . Right total knee replacement    . ROUX-EN-Y GASTRIC BYPASS  2011  . SKIN TAG REMOVAL    . THYROIDECTOMY    . TONSILLECTOMY AND ADENOIDECTOMY    . UPPER GASTROINTESTINAL ENDOSCOPY      There were no vitals filed for this visit.   Subjective Assessment - 10/12/20 1709    Subjective Takiya reports feeling stronger and as if she has better balance than before starting PT.  She still has trouble getting in and out of a car.    Patient Stated Goals Be able to sit and sleep without B hip pain and improve confidence with balance.    Currently in Pain? Yes    Pain Score 4     Pain Location Hip    Pain Orientation Left;Right    Pain Descriptors / Indicators Tightness;Sore    Pain Type Chronic pain    Pain Onset More than a month ago    Pain Frequency Constant    Aggravating Factors  Sitting and sleeping    Pain  Relieving Factors Change of position    Effect of Pain on Daily Activities Sleep is disturbed              [facility] PT Assessment - 10/12/20 0001      AROM   Right Hip Flexion 95    Right Hip External Rotation  42    Right Hip Internal Rotation  -4    Left Hip Flexion 100    Left Hip External Rotation  47    Left Hip Internal Rotation  -8                         [facility] Adult PT Treatment/Exercise - 10/12/20 0001      Knee/Hip Exercises: Stretches   Hip Flexor Stretch Left;Right;4 reps;20 seconds    Piriformis Stretch Left;Right;4 reps;20 seconds   DC next visit, normal range   Other Knee/Hip Stretches Gluteal stretch 4X each 20 seconds      Knee/Hip Exercises: Standing   Heel Raises Limitations Heel to toe raises (do  not rock and slow eccentrics) 2 sets of 10X 3 seconds    Other Standing Knee Exercises Gastroc stretch with slight toe in 5X 20 seconds      Knee/Hip Exercises: Seated   Long Arc Quad Strengthening;Both;3 sets   3 reps seated straight leg raises (30/Day recommended)                 PT Education - 10/12/20 1710    Education Details Reviewed HEP and emphasized areas that are still objectively faulty (hip AROM and overall strength).  Balance also a priority.    Person(s) Educated Patient    Methods Explanation;Demonstration;Verbal cues;Handout    Comprehension Returned demonstration;Verbalized understanding;Need further instruction;Verbal cues required               PT Long Term Goals - 10/12/20 1711      PT LONG TERM GOAL #1   Title Improve FOTO score to 65.    Baseline 58    Time 8    Period Weeks    Status On-going      PT LONG TERM GOAL #2   Title Geraline will report B hip pain as consistently 0-3/10 on the Numeric Pain Rating Scale.    Baseline Can be 5+/10    Time 8    Period Weeks    Status On-going      PT LONG TERM GOAL #3   Title Improve B hip AROM for flexion to 100 degrees; hamstrings to 45 degrees; IR to 10 degrees and ER to 45 degrees.    Baseline See objective.    Time 8    Period Weeks    Status On-going      PT LONG TERM GOAL #4   Title Improve B hip strength and balance as assessed by self-report and functional scores.    Time 8    Period Weeks    Status On-going      PT LONG TERM GOAL #5   Title Tulsi will be independent with her long-term HEP at DC.    Time 8    Period Weeks    Status On-going                 Plan - 10/12/20 1712    Clinical Impression Statement Annisa reports "about 1X/day" HEP compliance.  I reinforced the importance of 2-3X/day HEP compliance to meet LTGs.  Objective progress is noted with her hip  AROM and Ranesha notes progress with her balance and strength.  Continue supervised PT with likely RA next visit.     Personal Factors and Comorbidities Comorbidity 1    Comorbidities R TKA, L knee scope, nutritional deficits    Examination-Activity Limitations Sit;Sleep;Locomotion Level    Examination-Participation Restrictions Community Activity    Stability/Clinical Decision Making Evolving/Moderate complexity    Rehab Potential Good    PT Frequency 2x / week    PT Duration 8 weeks    PT Treatment/Interventions ADLs/Self Care Home Management;Moist Heat;Cryotherapy;Therapeutic activities;Stair training;Gait training;Therapeutic exercise;Balance training;Neuromuscular re-education;Patient/family education;Manual techniques;Dry needling    PT Next Visit Plan Balance and strength progressions to complement hip stretching    PT Home Exercise Plan Access Code: [email or web address]    Consulted and Agree with Plan of Care Patient           Patient will benefit from skilled therapeutic intervention in order to improve the following deficits and impairments:  Abnormal gait, Decreased activity tolerance, Decreased balance, Decreased endurance, Decreased range of motion, Decreased strength, Difficulty walking, Hypomobility, Impaired flexibility, Pain  Visit Diagnosis: Difficulty walking  Muscle weakness (generalized)  Stiffness of left hip, not elsewhere classified  Stiffness of right hip, not elsewhere classified  Pain in right hip  Pain in left hip  Pain in left lower leg     Problem List Patient Active Problem List   Diagnosis Date Noted  . Iron deficiency anemia 03/14/2020  . Neutropenia ([place]) 02/29/2020  . Deficiency anemia 02/29/2020  . Lesion of vulva 09/02/2019  . Menorrhagia 09/02/2019  . Neuropathy 09/02/2019  . Abdominal pain, epigastric 06/25/2019  . Irregular bowel habits 06/25/2019  . Coronary artery disease involving native coronary artery of native heart without angina pectoris   . Temporomandibular joint disorder 12/05/2018  . Fatigue 12/05/2018  . Inverted nipple 12/05/2018  .  Senile purpura ([place]) 11/21/2018  . GERD with esophagitis 11/21/2018  . Pseudogout involving multiple joints 11/21/2018  . Lipoma 07/22/2017  . Lichen sclerosus 40/76/8088  . H/O gastric bypass 11/16/2016  . GERD (gastroesophageal reflux disease) 11/16/2016  . Depression 11/16/2016  . Diabetic polyneuropathy associated with type 2 diabetes mellitus ([place]) 11/16/2016  . Hyperlipidemia 11/16/2016  . Left lower quadrant pain 11/16/2016  . Uterine cancer [facility]) s/p hysterectomy 1997 01/04/2012  . Postsurgical hypothyroidism 09/19/2010  . Generalized abdominal pain 10/20/2009  . Diabetic neuropathy ([place]) 11/21/2007  . Allergic rhinitis 11/21/2007  . Type 2 diabetes mellitus with diabetic neuropathy, unspecified ([place]) 07/08/2007  . Dyslipidemia associated with type 2 diabetes mellitus ([place]) 07/08/2007    Farley Ly PT, MPT 10/12/2020, 5:15 PM  [facility] Physical Therapy [address] [place], [place], [address] Phone: [phone]   Fax:  [phone]  Name: GAGANDEEP KOSSMAN MRN: [id number] Date of Birth: 1955-12-24

## 2020-10-14 ENCOUNTER — Telehealth: Payer: Self-pay | Admitting: Rehabilitative and Restorative Service Providers"

## 2020-10-14 ENCOUNTER — Encounter: Payer: Medicare Other | Admitting: Rehabilitative and Restorative Service Providers"

## 2020-10-14 NOTE — Telephone Encounter (Signed)
Savannah [name] to remind her of her appointments 12/8 and 12/10 at 1 PM.  She did not realize she had an appointment today.

## 2020-10-17 DIAGNOSIS — D485 Neoplasm of uncertain behavior of skin: Secondary | ICD-10-CM | POA: Diagnosis not present

## 2020-10-17 DIAGNOSIS — L570 Actinic keratosis: Secondary | ICD-10-CM | POA: Diagnosis not present

## 2020-10-17 DIAGNOSIS — C44612 Basal cell carcinoma of skin of right upper limb, including shoulder: Secondary | ICD-10-CM | POA: Diagnosis not present

## 2020-10-19 ENCOUNTER — Ambulatory Visit (INDEPENDENT_AMBULATORY_CARE_PROVIDER_SITE_OTHER): Payer: Medicare Other | Admitting: Rehabilitative and Restorative Service Providers"

## 2020-10-19 ENCOUNTER — Encounter: Payer: Self-pay | Admitting: Rehabilitative and Restorative Service Providers"

## 2020-10-19 ENCOUNTER — Other Ambulatory Visit: Payer: Self-pay

## 2020-10-19 DIAGNOSIS — R262 Difficulty in walking, not elsewhere classified: Secondary | ICD-10-CM

## 2020-10-19 DIAGNOSIS — M25651 Stiffness of right hip, not elsewhere classified: Secondary | ICD-10-CM | POA: Diagnosis not present

## 2020-10-19 DIAGNOSIS — M25652 Stiffness of left hip, not elsewhere classified: Secondary | ICD-10-CM | POA: Diagnosis not present

## 2020-10-19 DIAGNOSIS — M6281 Muscle weakness (generalized): Secondary | ICD-10-CM | POA: Diagnosis not present

## 2020-10-19 NOTE — Therapy (Signed)
[facility] Physical Therapy [address] [place], [place], [address] Phone: [phone]   Fax:  [phone]  Physical Therapy Treatment  Patient Details  Name: Savannah [name] MRN: [id number] Date of Birth: [birth year] Referring Provider (PT): Vivi Barrack MD PCP, Marylin Crosby   Encounter Date: 10/19/2020   PT End of Session - 10/19/20 1725    Visit Number 7    Number of Visits 16    PT Start Time 8299    PT Stop Time 1346    PT Time Calculation (min) 38 min    Activity Tolerance No increased pain;Patient tolerated treatment well    Behavior During Therapy [facility] for tasks assessed/performed           Past Medical History:  Diagnosis Date  . ABDOMINAL PAIN, CHRONIC 10/20/2009  . Acute cystitis 08/17/2009  . ALLERGIC RHINITIS 11/21/2007  . Allergy   . ASYMPTOMATIC POSTMENOPAUSAL STATUS 09/29/2008  . Bariatric surgery status 07/07/2010  . Bariatric surgery status 07/07/2010   Qualifier: Diagnosis of  By: Loanne Drilling MD, Jacelyn Pi   . Cancer ([place])    uterine  . Cataract   . Coronary artery disease   . DEPRESSION 11/21/2007  . DIABETES MELLITUS, TYPE II 07/08/2007  . DYSPNEA 05/20/2009  . Edema 05/20/2009  . Eosinophilic esophagitis 01/17/1695  . FEVER UNSPECIFIED 10/20/2009  . FEVER, HX OF 03/09/2010  . GERD (gastroesophageal reflux disease)   . Headache(784.0) 02/06/2010  . Hepatomegaly 01/06/2008  . HYPERLIPIDEMIA 07/08/2007  . HYPERTENSION 07/08/2007  . LEUKOPENIA, MILD 09/29/2008  . OBESITY 01/06/2008  . OSTEOARTHRITIS 01/06/2008  . Other chronic nonalcoholic liver disease 7/89/3810  . PERIPHERAL NEUROPATHY 11/21/2007  . Peripheral neuropathy   . Postsurgical hypothyroidism 09/19/2010  . SINUSITIS- ACUTE-NOS 11/21/2007  . TB SKIN TEST, POSITIVE 02/06/2010  . THYROID NODULE 03/09/2010    Past Surgical History:  Procedure Laterality Date  . ABDOMINAL HYSTERECTOMY    . BASAL CELL CARCINOMA EXCISION    . CARDIAC CATHETERIZATION    . COLONOSCOPY    . EYE SURGERY Bilateral  08/2018, 09/2018  . FOOT SURGERY Left    10/2019  . KNEE ARTHROSCOPY Left   . LEFT HEART CATH AND CORONARY ANGIOGRAPHY N/A 01/20/2019   Procedure: LEFT HEART CATH AND CORONARY ANGIOGRAPHY;  Surgeon: Belva Crome, MD;  Location: [place] [facility];  Service: Cardiovascular;  Laterality: N/A;  . LYMPH NODE DISSECTION    . OOPHORECTOMY    . Right total knee replacement    . ROUX-EN-Y GASTRIC BYPASS  2011  . SKIN TAG REMOVAL    . THYROIDECTOMY    . TONSILLECTOMY AND ADENOIDECTOMY    . UPPER GASTROINTESTINAL ENDOSCOPY      There were no vitals filed for this visit.   Subjective Assessment - 10/19/20 1324    Subjective Savannah [name] reports feeling stronger and as if she has better balance than before starting PT.  She still has trouble getting in and out of a car.    Patient Stated Goals Be able to sit and sleep without B hip pain and improve confidence with balance.    Currently in Pain? No/denies    Pain Score 0-No pain    Pain Onset More than a month ago    Pain Frequency Intermittent    Aggravating Factors  Prolonged standing    Pain Relieving Factors Change of position and walking    Effect of Pain on Daily Activities Prolonged standing and walking > 2 miles  [place] Adult PT Treatment/Exercise - 10/19/20 0001      Neuro Re-ed    Neuro Re-ed Details  Heel to toe balance eyes open/closed/eyes open head moving side to side 3X each  20 seconds      Knee/Hip Exercises: Stretches   Hip Flexor Stretch Left;Right;3 reps;20 seconds    Piriformis Stretch Left;Right;3 reps;20 seconds   DC next visit, normal range   Other Knee/Hip Stretches Gluteal stretch 3X each 20 seconds      Knee/Hip Exercises: Standing   Heel Raises Limitations Heel to toe raises (do not rock and slow eccentrics) 2 sets of 10X 3 seconds    Other Standing Knee Exercises Gastroc stretch with slight toe in 4X 20 seconds      Knee/Hip Exercises: Seated   Long Arc Quad  Strengthening;Both;3 sets   5 reps seated straight leg raises (30/Day recommended)                 PT Education - 10/19/20 1724    Education Details Reviewed HEP.  Discussed RA and possible DC next visit.    Person(s) Educated Patient    Methods Explanation;Demonstration;Verbal cues    Comprehension Verbalized understanding;Returned demonstration;Need further instruction;Verbal cues required               PT Long Term Goals - 10/19/20 1725      PT LONG TERM GOAL #1   Title Improve FOTO score to 65.    Baseline 58    Time 8    Period Weeks    Status On-going      PT LONG TERM GOAL #2   Title Savannah [name] will report B hip pain as consistently 0-3/10 on the Numeric Pain Rating Scale.    Baseline 5+/10 at evaluation, currently 0-2.5/10    Time 8    Period Weeks    Status Achieved      PT LONG TERM GOAL #3   Title Improve B hip AROM for flexion to 100 degrees; hamstrings to 45 degrees; IR to 10 degrees and ER to 45 degrees.    Baseline See objective.    Time 8    Period Weeks    Status On-going      PT LONG TERM GOAL #4   Title Improve B hip strength and balance as assessed by self-report and functional scores.    Time 8    Period Weeks    Status On-going      PT LONG TERM GOAL #5   Title Savannah [name] will be independent with her long-term HEP at DC.    Time 8    Period Weeks    Status On-going                 Plan - 10/19/20 1726    Clinical Impression Statement Savannah [name] reports minimal hip, ankle or foot pain over the past week.  She has been walking and doing her exercises 1-2X/day.  We discussed doing a RA on her next visit for possible DC and update of her HEP.    Personal Factors and Comorbidities Comorbidity 1    Comorbidities R TKA, L knee scope, nutritional deficits    Examination-Activity Limitations Sit;Sleep;Locomotion Level    Examination-Participation Restrictions Community Activity    Stability/Clinical Decision Making Evolving/Moderate complexity     Rehab Potential Good    PT Frequency 2x / week    PT Duration 8 weeks    PT Treatment/Interventions ADLs/Self Care Home Management;Moist Heat;Cryotherapy;Therapeutic activities;Stair training;Gait training;Therapeutic exercise;Balance  training;Neuromuscular re-education;Patient/family education;Manual techniques;Dry needling    PT Next Visit Plan RA, possible DC    PT Home Exercise Plan Access Code: [email or web address]    Consulted and Agree with Plan of Care Patient           Patient will benefit from skilled therapeutic intervention in order to improve the following deficits and impairments:  Abnormal gait, Decreased activity tolerance, Decreased balance, Decreased endurance, Decreased range of motion, Decreased strength, Difficulty walking, Hypomobility, Impaired flexibility, Pain  Visit Diagnosis: Difficulty walking  Muscle weakness (generalized)  Stiffness of left hip, not elsewhere classified  Stiffness of right hip, not elsewhere classified     Problem List Patient Active Problem List   Diagnosis Date Noted  . Iron deficiency anemia 03/14/2020  . Neutropenia ([place]) 02/29/2020  . Deficiency anemia 02/29/2020  . Lesion of vulva 09/02/2019  . Menorrhagia 09/02/2019  . Neuropathy 09/02/2019  . Abdominal pain, epigastric 06/25/2019  . Irregular bowel habits 06/25/2019  . Coronary artery disease involving native coronary artery of native heart without angina pectoris   . Temporomandibular joint disorder 12/05/2018  . Fatigue 12/05/2018  . Inverted nipple 12/05/2018  . Senile purpura ([place]) 11/21/2018  . GERD with esophagitis 11/21/2018  . Pseudogout involving multiple joints 11/21/2018  . Lipoma 07/22/2017  . Lichen sclerosus 83/25/4982  . H/O gastric bypass 11/16/2016  . GERD (gastroesophageal reflux disease) 11/16/2016  . Depression 11/16/2016  . Diabetic polyneuropathy associated with type 2 diabetes mellitus ([place]) 11/16/2016  . Hyperlipidemia 11/16/2016  . Left lower  quadrant pain 11/16/2016  . Uterine cancer [facility]) s/p hysterectomy 1997 01/04/2012  . Postsurgical hypothyroidism 09/19/2010  . Generalized abdominal pain 10/20/2009  . Diabetic neuropathy ([place]) 11/21/2007  . Allergic rhinitis 11/21/2007  . Type 2 diabetes mellitus with diabetic neuropathy, unspecified ([place]) 07/08/2007  . Dyslipidemia associated with type 2 diabetes mellitus ([place]) 07/08/2007    Farley Ly PT, MPT 10/19/2020, 5:28 PM  [facility] Physical Therapy [address] [place], [place], [address] Phone: [phone]   Fax:  [phone]  Name: Savannah [name] MRN: [id number] Date of Birth: [birth year]

## 2020-10-21 ENCOUNTER — Ambulatory Visit (INDEPENDENT_AMBULATORY_CARE_PROVIDER_SITE_OTHER): Payer: Medicare Other | Admitting: Rehabilitative and Restorative Service Providers"

## 2020-10-21 ENCOUNTER — Other Ambulatory Visit: Payer: Self-pay

## 2020-10-21 ENCOUNTER — Encounter: Payer: Self-pay | Admitting: Rehabilitative and Restorative Service Providers"

## 2020-10-21 ENCOUNTER — Encounter: Payer: Medicare Other | Admitting: Rehabilitative and Restorative Service Providers"

## 2020-10-21 ENCOUNTER — Ambulatory Visit (INDEPENDENT_AMBULATORY_CARE_PROVIDER_SITE_OTHER): Payer: Medicare Other | Admitting: Family Medicine

## 2020-10-21 ENCOUNTER — Encounter: Payer: Self-pay | Admitting: Family Medicine

## 2020-10-21 ENCOUNTER — Other Ambulatory Visit: Payer: Self-pay | Admitting: Gastroenterology

## 2020-10-21 VITALS — BP 120/76 | HR 66 | Temp 97.9°F | Ht 71.0 in | Wt 166.0 lb

## 2020-10-21 DIAGNOSIS — K2100 Gastro-esophageal reflux disease with esophagitis, without bleeding: Secondary | ICD-10-CM | POA: Diagnosis not present

## 2020-10-21 DIAGNOSIS — J849 Interstitial pulmonary disease, unspecified: Secondary | ICD-10-CM | POA: Diagnosis not present

## 2020-10-21 DIAGNOSIS — C55 Malignant neoplasm of uterus, part unspecified: Secondary | ICD-10-CM | POA: Diagnosis not present

## 2020-10-21 DIAGNOSIS — Z794 Long term (current) use of insulin: Secondary | ICD-10-CM | POA: Diagnosis not present

## 2020-10-21 DIAGNOSIS — E1151 Type 2 diabetes mellitus with diabetic peripheral angiopathy without gangrene: Secondary | ICD-10-CM | POA: Insufficient documentation

## 2020-10-21 DIAGNOSIS — M6281 Muscle weakness (generalized): Secondary | ICD-10-CM

## 2020-10-21 DIAGNOSIS — M25651 Stiffness of right hip, not elsewhere classified: Secondary | ICD-10-CM | POA: Diagnosis not present

## 2020-10-21 DIAGNOSIS — M25652 Stiffness of left hip, not elsewhere classified: Secondary | ICD-10-CM

## 2020-10-21 DIAGNOSIS — E1149 Type 2 diabetes mellitus with other diabetic neurological complication: Secondary | ICD-10-CM | POA: Diagnosis not present

## 2020-10-21 DIAGNOSIS — Z23 Encounter for immunization: Secondary | ICD-10-CM

## 2020-10-21 DIAGNOSIS — R262 Difficulty in walking, not elsewhere classified: Secondary | ICD-10-CM | POA: Diagnosis not present

## 2020-10-21 MED ORDER — PANTOPRAZOLE SODIUM 40 MG PO TBEC
40.0000 mg | DELAYED_RELEASE_TABLET | Freq: Two times a day (BID) | ORAL | 1 refills | Status: DC
Start: 2020-10-21 — End: 2021-02-13

## 2020-10-21 NOTE — Therapy (Signed)
[facility] Physical Therapy [address] [place], [place], [address] Phone: [phone]   Fax:  [phone]  Physical Therapy Treatment/Reassessment/DC  Patient Details  Name: Savannah [name] MRN: [id number] Date of Birth: 05-Oct-1956 Referring Provider (PT): Vivi Barrack MD PCP, [name] ortho   Encounter Date: 10/21/2020   PT End of Session - 10/21/20 1332    Visit Number 8    Number of Visits 16    PT Start Time 6073    PT Stop Time 1150    PT Time Calculation (min) 38 min    Activity Tolerance No increased pain;Patient tolerated treatment well    Behavior During Therapy [facility] for tasks assessed/performed           Past Medical History:  Diagnosis Date  . ABDOMINAL PAIN, CHRONIC 10/20/2009  . Acute cystitis 08/17/2009  . ALLERGIC RHINITIS 11/21/2007  . Allergy   . ASYMPTOMATIC POSTMENOPAUSAL STATUS 09/29/2008  . Bariatric surgery status 07/07/2010  . Bariatric surgery status 07/07/2010   Qualifier: Diagnosis of  By: Loanne Drilling MD, Jacelyn Pi   . Cancer ([place])    uterine  . Cataract   . Coronary artery disease   . DEPRESSION 11/21/2007  . DIABETES MELLITUS, TYPE II 07/08/2007  . DYSPNEA 05/20/2009  . Edema 05/20/2009  . Eosinophilic esophagitis 05/12/625  . FEVER UNSPECIFIED 10/20/2009  . FEVER, HX OF 03/09/2010  . GERD (gastroesophageal reflux disease)   . Headache(784.0) 02/06/2010  . Hepatomegaly 01/06/2008  . HYPERLIPIDEMIA 07/08/2007  . HYPERTENSION 07/08/2007  . LEUKOPENIA, MILD 09/29/2008  . OBESITY 01/06/2008  . OSTEOARTHRITIS 01/06/2008  . Other chronic nonalcoholic liver disease 9/48/5462  . PERIPHERAL NEUROPATHY 11/21/2007  . Peripheral neuropathy   . Postsurgical hypothyroidism 09/19/2010  . SINUSITIS- ACUTE-NOS 11/21/2007  . TB SKIN TEST, POSITIVE 02/06/2010  . THYROID NODULE 03/09/2010    Past Surgical History:  Procedure Laterality Date  . ABDOMINAL HYSTERECTOMY    . BASAL CELL CARCINOMA EXCISION    . CARDIAC CATHETERIZATION    . COLONOSCOPY    . EYE  SURGERY Bilateral 08/2018, 09/2018  . FOOT SURGERY Left    10/2019  . KNEE ARTHROSCOPY Left   . LEFT HEART CATH AND CORONARY ANGIOGRAPHY N/A 01/20/2019   Procedure: LEFT HEART CATH AND CORONARY ANGIOGRAPHY;  Surgeon: Belva Crome, MD;  Location: [place] [facility];  Service: Cardiovascular;  Laterality: N/A;  . LYMPH NODE DISSECTION    . OOPHORECTOMY    . Right total knee replacement    . ROUX-EN-Y GASTRIC BYPASS  2011  . SKIN TAG REMOVAL    . THYROIDECTOMY    . TONSILLECTOMY AND ADENOIDECTOMY    . UPPER GASTROINTESTINAL ENDOSCOPY      There were no vitals filed for this visit.   Subjective Assessment - 10/21/20 1327    Subjective Savannah [name] reports minimal to no pain this week.  She wants to transition into independent rehabilitation.    Patient Stated Goals Be able to sit and sleep without B hip pain and improve confidence with balance.    Currently in Pain? No/denies    Pain Onset More than a month ago              [facility] PT Assessment - 10/21/20 0001      AROM   Overall AROM  Deficits    AROM Assessment Site Ankle;Hip    Right/Left Hip Left;Right    Right Hip Flexion 100    Right Hip External Rotation  42    Right  Hip Internal Rotation  -2    Left Hip Flexion 100    Left Hip External Rotation  50    Left Hip Internal Rotation  -3    Right/Left Ankle Left;Right    Right Ankle Dorsiflexion 6    Left Ankle Dorsiflexion 6                         [facility] Adult PT Treatment/Exercise - 10/21/20 0001      Knee/Hip Exercises: Stretches   Other Knee/Hip Stretches Gluteal stretch 3X each 20 seconds      Knee/Hip Exercises: Standing   Heel Raises Limitations Heel to toe raises (do not rock and slow eccentrics) 2 sets of 10X 3 seconds      Knee/Hip Exercises: Seated   Long Arc Quad Strengthening;Both;3 sets   5 reps seated straight leg raises (30/Day recommended)   Other Seated Knee/Hip Exercises Seated marching 2 sets of 10 3 seconds                   PT Education - 10/21/20 1330    Education Details Reviewed exam findings and recommended DC HEP.    Person(s) Educated Patient    Methods Explanation;Demonstration;Verbal cues    Comprehension Verbalized understanding;Returned demonstration;Verbal cues required               PT Long Term Goals - 10/21/20 1330      PT LONG TERM GOAL #1   Title Improve FOTO score to 65.    Baseline 58 baseline 68 at DC    Time 8    Period Weeks    Status Achieved      PT LONG TERM GOAL #2   Title Savannah [name] will report B hip pain as consistently 0-3/10 on the Numeric Pain Rating Scale.    Baseline 5+/10 at evaluation, currently 0-2/10    Time 8    Period Weeks    Status Achieved      PT LONG TERM GOAL #3   Title Improve B hip AROM for flexion to 100 degrees; hamstrings to 45 degrees; IR to 10 degrees and ER to 45 degrees.    Baseline See objective.    Time 8    Period Weeks    Status Partially Met      PT LONG TERM GOAL #4   Title Improve B hip strength and balance as assessed by self-report and functional scores.    Time 8    Period Weeks    Status Partially Met      PT LONG TERM GOAL #5   Title Savannah [name] will be independent with her long-term HEP at DC.    Time 8    Period Weeks    Status Achieved                 Plan - 10/21/20 1333    Clinical Impression Statement Savannah [name] has made excellent progress with her supervised PT.  FOTO score is 68 (58 at eval, 65 goal).  Pain has been minimal for > a week.  Remaining impairments are being addressed with her updated HEP and she reports being ready for transfer into independent rehabilitation.    Personal Factors and Comorbidities Comorbidity 1    Comorbidities R TKA, L knee scope, nutritional deficits    Examination-Activity Limitations Sit;Sleep;Locomotion Level    Examination-Participation Restrictions Community Activity    Stability/Clinical Decision Making Evolving/Moderate complexity    Rehab Potential Good  PT Frequency 2x / week     PT Duration 8 weeks    PT Treatment/Interventions ADLs/Self Care Home Management;Moist Heat;Cryotherapy;Therapeutic activities;Stair training;Gait training;Therapeutic exercise;Balance training;Neuromuscular re-education;Patient/family education;Manual techniques;Dry needling    PT Next Visit Plan RA, possible DC    PT Home Exercise Plan Access Code: [email or web address]    Consulted and Agree with Plan of Care Patient           Patient will benefit from skilled therapeutic intervention in order to improve the following deficits and impairments:  Abnormal gait,Decreased activity tolerance,Decreased balance,Decreased endurance,Decreased range of motion,Decreased strength,Difficulty walking,Hypomobility,Impaired flexibility,Pain  Visit Diagnosis: Difficulty walking  Muscle weakness (generalized)  Stiffness of left hip, not elsewhere classified  Stiffness of right hip, not elsewhere classified     Problem List Patient Active Problem List   Diagnosis Date Noted  . Iron deficiency anemia 03/14/2020  . Neutropenia ([place]) 02/29/2020  . Deficiency anemia 02/29/2020  . Lesion of vulva 09/02/2019  . Menorrhagia 09/02/2019  . Neuropathy 09/02/2019  . Abdominal pain, epigastric 06/25/2019  . Irregular bowel habits 06/25/2019  . Coronary artery disease involving native coronary artery of native heart without angina pectoris   . Temporomandibular joint disorder 12/05/2018  . Fatigue 12/05/2018  . Inverted nipple 12/05/2018  . Senile purpura ([place]) 11/21/2018  . GERD with esophagitis 11/21/2018  . Pseudogout involving multiple joints 11/21/2018  . Lipoma 07/22/2017  . Lichen sclerosus 62/26/3335  . H/O gastric bypass 11/16/2016  . GERD (gastroesophageal reflux disease) 11/16/2016  . Depression 11/16/2016  . Diabetic polyneuropathy associated with type 2 diabetes mellitus ([place]) 11/16/2016  . Hyperlipidemia 11/16/2016  . Left lower quadrant pain 11/16/2016  . Uterine cancer [facility]) s/p  hysterectomy 1997 01/04/2012  . Postsurgical hypothyroidism 09/19/2010  . Generalized abdominal pain 10/20/2009  . Diabetic neuropathy ([place]) 11/21/2007  . Allergic rhinitis 11/21/2007  . Type 2 diabetes mellitus with diabetic neuropathy, unspecified ([place]) 07/08/2007  . Dyslipidemia associated with type 2 diabetes mellitus ([place]) 07/08/2007   PHYSICAL THERAPY DISCHARGE SUMMARY  Visits from Start of Care: 8  Current functional level related to goals / functional outcomes: See note   Remaining deficits: See note   Education / Equipment: Updated HEP Plan: Patient agrees to discharge.  Patient goals were met. Patient is being discharged due to the physician's request.  ?????     Farley Ly PT, MPT 10/21/2020, 1:35 PM  [facility] Physical Therapy [address] [place], [place], [address] Phone: [phone]   Fax:  [phone]  Name: Savannah [name] MRN: [id number] Date of Birth: [birth year]

## 2020-10-21 NOTE — Addendum Note (Signed)
Addended by: Betti Cruz on: 10/21/2020 03:14 PM   Modules accepted: Orders

## 2020-10-21 NOTE — Progress Notes (Signed)
   BRIGGITTE BOLINE is a 64 y.o. female who presents today for an office visit.  Assessment/Plan:  Chronic Problems Addressed Today: Uterine cancer [facility]) s/p hysterectomy 1997 Managed per gynecology.  Had this within the last few months.    Type 2 diabetes mellitus with diabetic peripheral angiopathy without gangrene, with long-term current use of insulin ([place]) Managed by endocrinology.  On Trulicity, Invokana, Metformin 2000 mg daily, and Prandin 2 mg twice daily.  GERD with esophagitis Will refill protonix. She is tolerating well.   Diabetic neuropathy (HCC) Stable on gabapentin 952m tid though is having some fatigue. This is manageable.   Elevated LFTS RUQ [place] pending.   Preventative Healthcare Pneumonia vaccine given today.  Due for bone density scan-this order was placed but she has not done yet. UTD on other vaccines and screenings.     Subjective:  HPI:  See A/P for status of chronic conditions.        Objective:  Physical Exam: BP 120/76   Pulse 66   Temp 97.9 F (36.6 C) (Temporal)   Ht 5' 11"  (1.803 m)   Wt 166 lb (75.3 kg)   BMI 23.15 kg/m   Gen: No acute distress, resting comfortably CV: Regular rate and rhythm with no murmurs appreciated Pulm: Normal work of breathing, clear to auscultation bilaterally with no crackles, wheezes, or rhonchi Neuro: Grossly normal, moves all extremities Psych: Normal affect and thought content      Elisandro Jarrett M. PJerline Pain MD 10/21/2020 3:09 PM

## 2020-10-21 NOTE — Assessment & Plan Note (Signed)
Managed per gynecology.  Had this within the last few months.

## 2020-10-21 NOTE — Assessment & Plan Note (Signed)
Will refill protonix. She is tolerating well.

## 2020-10-21 NOTE — Assessment & Plan Note (Signed)
Managed by endocrinology.  On Trulicity, Invokana, Metformin 2000 mg daily, and Prandin 2 mg twice daily.

## 2020-10-21 NOTE — Assessment & Plan Note (Signed)
Stable on gabapentin 944m tid though is having some fatigue. This is manageable.

## 2020-10-21 NOTE — Patient Instructions (Addendum)
It was very nice to see you today!  We will refill your medications and give you the pneumonia vaccine today.  We need to get a bone density scan and your liver ultrasound.  I will see back in a year.  Please come back to see me sooner if needed.  Take care, Dr Jerline Pain  Please try these tips to maintain a healthy lifestyle:   Eat at least 3 REAL meals and 1-2 snacks per day.  Aim for no more than 5 hours between eating.  If you eat breakfast, please do so within one hour of getting up.    Each meal should contain half fruits/vegetables, one quarter protein, and one quarter carbs (no bigger than a computer mouse)   Cut down on sweet beverages. This includes juice, soda, and sweet tea.     Drink at least 1 glass of water with each meal and aim for at least 8 glasses per day   Exercise at least 150 minutes every week.

## 2020-11-08 ENCOUNTER — Other Ambulatory Visit: Payer: Self-pay

## 2020-11-08 ENCOUNTER — Inpatient Hospital Stay: Payer: Medicare Other | Attending: Physician Assistant

## 2020-11-08 ENCOUNTER — Inpatient Hospital Stay (HOSPITAL_BASED_OUTPATIENT_CLINIC_OR_DEPARTMENT_OTHER): Payer: Medicare Other | Admitting: Internal Medicine

## 2020-11-08 ENCOUNTER — Telehealth: Payer: Self-pay | Admitting: Internal Medicine

## 2020-11-08 VITALS — BP 99/65 | HR 82 | Temp 98.3°F | Resp 18 | Ht 71.0 in | Wt 162.0 lb

## 2020-11-08 DIAGNOSIS — D539 Nutritional anemia, unspecified: Secondary | ICD-10-CM | POA: Diagnosis not present

## 2020-11-08 DIAGNOSIS — E119 Type 2 diabetes mellitus without complications: Secondary | ICD-10-CM | POA: Diagnosis not present

## 2020-11-08 DIAGNOSIS — Z7984 Long term (current) use of oral hypoglycemic drugs: Secondary | ICD-10-CM | POA: Insufficient documentation

## 2020-11-08 DIAGNOSIS — D500 Iron deficiency anemia secondary to blood loss (chronic): Secondary | ICD-10-CM | POA: Diagnosis not present

## 2020-11-08 DIAGNOSIS — R5383 Other fatigue: Secondary | ICD-10-CM | POA: Diagnosis not present

## 2020-11-08 DIAGNOSIS — Z862 Personal history of diseases of the blood and blood-forming organs and certain disorders involving the immune mechanism: Secondary | ICD-10-CM | POA: Insufficient documentation

## 2020-11-08 DIAGNOSIS — Z9884 Bariatric surgery status: Secondary | ICD-10-CM | POA: Insufficient documentation

## 2020-11-08 DIAGNOSIS — Z79899 Other long term (current) drug therapy: Secondary | ICD-10-CM | POA: Diagnosis not present

## 2020-11-08 DIAGNOSIS — I2510 Atherosclerotic heart disease of native coronary artery without angina pectoris: Secondary | ICD-10-CM

## 2020-11-08 DIAGNOSIS — I10 Essential (primary) hypertension: Secondary | ICD-10-CM | POA: Diagnosis not present

## 2020-11-08 LAB — CBC WITH DIFFERENTIAL (CANCER CENTER ONLY)
Abs Immature Granulocytes: 0.01 10*3/uL (ref 0.00–0.07)
Basophils Absolute: 0 10*3/uL (ref 0.0–0.1)
Basophils Relative: 1 %
Eosinophils Absolute: 0.2 10*3/uL (ref 0.0–0.5)
Eosinophils Relative: 5 %
HCT: 40.2 % (ref 36.0–46.0)
Hemoglobin: 12.8 g/dL (ref 12.0–15.0)
Immature Granulocytes: 0 %
Lymphocytes Relative: 33 %
Lymphs Abs: 1.2 10*3/uL (ref 0.7–4.0)
MCH: 32.5 pg (ref 26.0–34.0)
MCHC: 31.8 g/dL (ref 30.0–36.0)
MCV: 102 fL — ABNORMAL HIGH (ref 80.0–100.0)
Monocytes Absolute: 0.4 10*3/uL (ref 0.1–1.0)
Monocytes Relative: 11 %
Neutro Abs: 1.8 10*3/uL (ref 1.7–7.7)
Neutrophils Relative %: 50 %
Platelet Count: 174 10*3/uL (ref 150–400)
RBC: 3.94 MIL/uL (ref 3.87–5.11)
RDW: 12.7 % (ref 11.5–15.5)
WBC Count: 3.5 10*3/uL — ABNORMAL LOW (ref 4.0–10.5)
nRBC: 0 % (ref 0.0–0.2)

## 2020-11-08 NOTE — Progress Notes (Signed)
[facility] [place] [phone]   [phone]  OFFICE PROGRESS NOTE  [place], [place] [address]  DIAGNOSIS: Anemia and leukocytopenia and occasional thrombocytopenia.  PRIOR THERAPY: None  CURRENT THERAPY:  Venofer infusion weekly status post 9 doses.  INTERVAL HISTORY: Savannah [name] 64 y.o. female returns to the clinic today for follow-up visit.  The patient is feeling fine today with no concerning complaints except for mild fatigue.  She denied having any current chest pain, shortness of breath, cough or hemoptysis.  She denied having any fever or chills.  She has no nausea, vomiting, diarrhea or constipation.  She denied having any bleeding issues.  She had repeat CBC, iron study and ferritin as well as vitamin B12 level today and she is here for evaluation and discussion of her lab results and treatment options.  MEDICAL HISTORY: Past Medical History:  Diagnosis Date  . ABDOMINAL PAIN, CHRONIC 10/20/2009  . Acute cystitis 08/17/2009  . ALLERGIC RHINITIS 11/21/2007  . Allergy   . ASYMPTOMATIC POSTMENOPAUSAL STATUS 09/29/2008  . Bariatric surgery status 07/07/2010  . Bariatric surgery status 07/07/2010   Qualifier: Diagnosis of  By: Loanne Drilling MD, Jacelyn Pi   . Cancer ([place])    uterine  . Cataract   . Coronary artery disease   . DEPRESSION 11/21/2007  . DIABETES MELLITUS, TYPE II 07/08/2007  . DYSPNEA 05/20/2009  . Edema 05/20/2009  . Eosinophilic esophagitis 05/19/2422  . FEVER UNSPECIFIED 10/20/2009  . FEVER, HX OF 03/09/2010  . GERD (gastroesophageal reflux disease)   . Headache(784.0) 02/06/2010  . Hepatomegaly 01/06/2008  . HYPERLIPIDEMIA 07/08/2007  . HYPERTENSION 07/08/2007  . LEUKOPENIA, MILD 09/29/2008  . OBESITY 01/06/2008  . OSTEOARTHRITIS 01/06/2008  . Other chronic nonalcoholic liver disease 5/36/1443  . PERIPHERAL NEUROPATHY 11/21/2007  . Peripheral neuropathy   . Postsurgical hypothyroidism 09/19/2010  . SINUSITIS-  ACUTE-NOS 11/21/2007  . TB SKIN TEST, POSITIVE 02/06/2010  . THYROID NODULE 03/09/2010    ALLERGIES:  is allergic to ace inhibitors, caffeine, ciprofloxacin, morphine and related, mucinex [guaifenesin er], nsaids, other, and silicone.  MEDICATIONS:  Current Outpatient Medications  Medication Sig Dispense Refill  . 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  Correct leg length and improve pronation BL 1 each 0  . amoxicillin (AMOXIL) 500 MG capsule TAKE 4 CAPSULES BY MOUTH ONE HOUR BEFORE DENTAL APPT.    [name] aspirin EC 81 MG tablet Take 1 tablet (81 mg total) by mouth daily. Starting 01/18/19 prior to cardiac cath    . atorvastatin (LIPITOR) 40 MG tablet Take 1 tablet (40 mg total) by mouth daily. 90 tablet 3  . BIOTIN PO Take by mouth.    . canagliflozin (INVOKANA) 300 MG TABS tablet Take 1 tablet (300 mg total) by mouth daily before breakfast. 90 tablet 3  . Cholecalciferol (VITAMIN D3) 125 MCG (5000 UT) CAPS Take 5,000 Units by mouth daily.    . cyclobenzaprine (FLEXERIL) 10 MG tablet Take 10 mg by mouth at bedtime as needed.     . desloratadine (CLARINEX) 5 MG tablet Take 5 mg by mouth daily.    . diclofenac sodium (VOLTAREN) 1 % GEL Apply 4 g topically 4 (four) times daily. (Patient taking differently: Apply 4 g topically as needed (pain).) 100 g 11  . docusate sodium (COLACE) 100 MG capsule Take 200 mg by mouth 2 (two) times daily.    . Dulaglutide (TRULICITY) 3 XV/4.0GQ SOPN Inject 3 mg  as directed once a week. 6 mL 3  . Fluconazole (DIFLUCAN PO) Take by mouth as needed.    . furosemide (LASIX) 20 MG tablet Take 1 tablet (20 mg total) by mouth daily. (Patient taking differently: Take 20 mg by mouth daily as needed for fluid. ) 90 tablet 3  . gabapentin (NEURONTIN) 300 MG capsule TAKE 3 CAPSULES (900 MG TOTAL) BY MOUTH 3 (THREE) TIMES DAILY. 360 capsule 2  . halobetasol (ULTRAVATE) 0.05 % cream Apply 1 application topically 2 (two) times daily as needed (lichen  sclerosus).   2  . HYDROcodone-acetaminophen (NORCO/VICODIN) 5-325 MG tablet Take 1 tablet by mouth daily as needed for moderate pain or severe pain.    [name] levothyroxine (SYNTHROID) 112 MCG tablet TAKE 1 TABLET BY MOUTH EVERY DAY (Patient taking differently: Take 112 mcg by mouth daily before breakfast.) 90 tablet 3  . MAGNESIUM PO Take 1 tablet by mouth daily as needed (cramps).     . metFORMIN (GLUCOPHAGE-XR) 500 MG 24 hr tablet Take 4 tablets (2,000 mg total) by mouth daily. 360 tablet 3  . MITIGARE 0.6 MG CAPS TAKE 1 CAPSULE BY MOUTH EVERY DAY 90 capsule 0  . Multiple Vitamin (MULTIVITAMIN) tablet Take 2 tablets by mouth daily.    . nitroGLYCERIN (NITROSTAT) 0.4 MG SL tablet Place 1 tablet (0.4 mg total) under the tongue every 5 (five) minutes as needed. 25 tablet 3  . ONETOUCH ULTRA test strip USE TO MONITOR GLUCOSE LEVELS ONCE PER DAY E11.40 100 strip 2  . oxyCODONE-acetaminophen (PERCOCET) 5-325 MG tablet Take 1 tablet by mouth every 6 (six) hours as needed for severe pain. 30 tablet 0  . pantoprazole (PROTONIX) 40 MG tablet TAKE 1 TABLET BY MOUTH TWICE A DAY 180 tablet 1  . pantoprazole (PROTONIX) 40 MG tablet Take 1 tablet (40 mg total) by mouth 2 (two) times daily. 180 tablet 1  . phenazopyridine (PYRIDIUM) 100 MG tablet Take 100 mg by mouth 3 (three) times daily as needed for pain.    . Probiotic CAPS Take 1 capsule by mouth daily.    . repaglinide (PRANDIN) 2 MG tablet Take 1 tablet (2 mg total) by mouth 2 (two) times daily before a meal. 180 tablet 3  . traMADol (ULTRAM) 50 MG tablet Take 50 mg by mouth 2 (two) times daily as needed for moderate pain.     No current facility-administered medications for this visit.    SURGICAL HISTORY:  Past Surgical History:  Procedure Laterality Date  . ABDOMINAL HYSTERECTOMY    . BASAL CELL CARCINOMA EXCISION    . CARDIAC CATHETERIZATION    . COLONOSCOPY    . EYE SURGERY Bilateral 08/2018, 09/2018  . FOOT SURGERY Left    10/2019  . KNEE  ARTHROSCOPY Left   . LEFT HEART CATH AND CORONARY ANGIOGRAPHY N/A 01/20/2019   Procedure: LEFT HEART CATH AND CORONARY ANGIOGRAPHY;  Surgeon: Belva Crome, MD;  Location: [facility];  Service: Cardiovascular;  Laterality: N/A;  . LYMPH NODE DISSECTION    . OOPHORECTOMY    . Right total knee replacement    . ROUX-EN-Y GASTRIC BYPASS  2011  . SKIN TAG REMOVAL    . THYROIDECTOMY    . TONSILLECTOMY AND ADENOIDECTOMY    . UPPER GASTROINTESTINAL ENDOSCOPY      REVIEW OF SYSTEMS:  A comprehensive review of systems was negative except for: Constitutional: positive for fatigue   PHYSICAL EXAMINATION: General appearance: alert, cooperative, fatigued and no distress Head: Normocephalic,  without obvious abnormality, atraumatic Neck: no adenopathy, no JVD, supple, symmetrical, trachea midline and thyroid not enlarged, symmetric, no tenderness/mass/nodules Lymph nodes: Cervical, supraclavicular, and axillary nodes normal. Resp: clear to auscultation bilaterally Back: symmetric, no curvature. ROM normal. No CVA tenderness. Cardio: regular rate and rhythm, S1, S2 normal, no murmur, click, rub or gallop GI: soft, non-tender; bowel sounds normal; no masses,  no organomegaly Extremities: extremities normal, atraumatic, no cyanosis or edema  ECOG PERFORMANCE STATUS: 1 - Symptomatic but completely ambulatory  Blood pressure 99/65, pulse 82, temperature 98.3 F (36.8 C), temperature source Tympanic, resp. rate 18, height 5' 11"  (1.803 m), weight 162 lb (73.5 kg), SpO2 100 %.  LABORATORY DATA: Lab Results  Component Value Date   WBC 3.5 (L) 11/08/2020   HGB 12.8 11/08/2020   HCT 40.2 11/08/2020   MCV 102.0 (H) 11/08/2020   PLT 174 11/08/2020      Chemistry      Component Value Date/Time   NA 142 10/11/2020 1156   NA 144 07/06/2019 1524   K 4.2 10/11/2020 1156   CL 107 10/11/2020 1156   CO2 25 10/11/2020 1156   BUN 15 10/11/2020 1156   BUN 22 07/06/2019 1524   CREATININE 0.94  10/11/2020 1156      Component Value Date/Time   CALCIUM 8.8 10/11/2020 1156   CALCIUM 8.9 01/30/2013 1017   ALKPHOS 125 02/29/2020 1303   AST 49 (H) 10/11/2020 1156   AST 25 02/29/2020 1303   ALT 66 (H) 10/11/2020 1156   ALT 37 02/29/2020 1303   BILITOT 0.4 10/11/2020 1156   BILITOT 0.4 02/29/2020 1303       RADIOGRAPHIC STUDIES: No results found.  ASSESSMENT AND PLAN: This is a 64 years old white female with history of iron deficiency anemia secondary to menorrhagia.  The patient started treatment with iron infusion with Venofer for four doses. She continues to have persistent fatigue but repeat CBC and iron study as well as ferritin today showed significant improvement in her condition with normalization of her hemoglobin and hematocrit as well as the serum iron and ferritin. She was treated with Venofer infusion for 9 doses.  The patient is currently on observation and she is feeling fine except for mild fatigue. CBC today showed normal hemoglobin and hematocrit.  Iron study and ferritin are still pending. I recommended for the patient to continue on observation for now. I will see her back for follow-up visit in 3 months for evaluation with repeat CBC, iron study and ferritin as well as vitamin B12 level.  I will consider the patient for iron infusion if the pending iron study showed significant iron deficiency. She was advised to call immediately if she has any other concerning symptoms in the interval. The patient voices understanding of current disease status and treatment options and is in agreement with the current care plan.  All questions were answered. The patient knows to call the clinic with any problems, questions or concerns. We can certainly see the patient much sooner if necessary.  Disclaimer: This note was dictated with voice recognition software. Similar sounding words can inadvertently be transcribed and may not be corrected upon review.

## 2020-11-08 NOTE — Telephone Encounter (Signed)
Scheduled appointments per 12/28 los. Spoke to patient who is aware of appointments date and times.

## 2020-11-09 ENCOUNTER — Encounter: Payer: Self-pay | Admitting: Family Medicine

## 2020-11-09 ENCOUNTER — Ambulatory Visit: Payer: Self-pay

## 2020-11-09 ENCOUNTER — Ambulatory Visit (INDEPENDENT_AMBULATORY_CARE_PROVIDER_SITE_OTHER): Payer: Medicare Other | Admitting: Family Medicine

## 2020-11-09 ENCOUNTER — Ambulatory Visit (INDEPENDENT_AMBULATORY_CARE_PROVIDER_SITE_OTHER): Payer: Medicare Other

## 2020-11-09 VITALS — BP 98/60 | Ht 71.0 in | Wt 164.6 lb

## 2020-11-09 DIAGNOSIS — M76822 Posterior tibial tendinitis, left leg: Secondary | ICD-10-CM | POA: Diagnosis not present

## 2020-11-09 DIAGNOSIS — M79672 Pain in left foot: Secondary | ICD-10-CM | POA: Diagnosis not present

## 2020-11-09 DIAGNOSIS — I2510 Atherosclerotic heart disease of native coronary artery without angina pectoris: Secondary | ICD-10-CM | POA: Diagnosis not present

## 2020-11-09 DIAGNOSIS — M25872 Other specified joint disorders, left ankle and foot: Secondary | ICD-10-CM

## 2020-11-09 DIAGNOSIS — M19072 Primary osteoarthritis, left ankle and foot: Secondary | ICD-10-CM | POA: Diagnosis not present

## 2020-11-09 LAB — IRON AND TIBC
Iron: 100 ug/dL (ref 41–142)
Saturation Ratios: 36 % (ref 21–57)
TIBC: 281 ug/dL (ref 236–444)
UIBC: 181 ug/dL (ref 120–384)

## 2020-11-09 LAB — FERRITIN: Ferritin: 265 ng/mL (ref 11–307)

## 2020-11-09 NOTE — Progress Notes (Signed)
I, Savannah [name], LAT, ATC, am serving as scribe for Dr. Lynne [name].  Savannah [name] is a 64 y.o. female who presents to [place] at [facility] today for f/u of L medial calf pain / post tib tendinopathy.  She was last seen by Dr. [name] on 10/03/20 and was referred to outpatient PT of which she's completed 8 visits.  She was also advised to look into purchasing custom orthotics.  Since her last visit, pt reports that her L medial lower leg/ankle/foot feels about the same.  She has completed PT.  She has been having more pain along her L medial longitudinal arch and has also been having some pain at the plantar aspect of her 1st MTP joint.  She con't to have swelling in her L ankle and lower leg but this is unchanged from previously.   Pertinent review of systems: No fevers or chills  Relevant historical information: History of left foot calcaneus fracture   Exam:  BP 98/60 (BP Location: Right Arm, Patient Position: Sitting, Cuff Size: Normal)   Ht 5' 11"  (1.803 m)   Wt 164 lb 9.6 oz (74.7 kg)   BMI 22.96 kg/m  General: Well Developed, well nourished, and in no acute distress.   MSK: Left foot and ankle significant pes planus with ankle pronation. Ankle is swollen.  Tender palpation at medial ankle and medial plantar midfoot Decreased foot and ankle motion. Left foot tender palpation at medial to plantar first MTP area. Pulses cap refill and sensation are intact distally.    Lab and Radiology Results  X-ray images left foot obtained today personally and independently interpreted. Significant abnormal appearance of calcaneus following fracture.  The calcaneus is elongated with lack of typical curvature.  No fracture line is visible within the calcaneus however. Some calcifications are present at plantar foot and midfoot seen on lateral x-ray views slight new compared to prior x-ray foot obtained in May 2021 Await formal radiology review  Diagnostic Limited MSK  Ultrasound of: Left foot and ankle Posterior tibialis tendon visualized.  Hypoechoic fluid tracking within tendon sheath as it crosses the midfoot near the insertion site onto the navicular prominence.  This is an area of tenderness corresponds to a hypoechoic change on ultrasound.  Area of tenderness at the great toe corresponds to the medial sesamoid bone.  The bone cortex is normal-appearing with normal-appearing flexor tendon.   First MTP mild effusion  Impression: Posterior tibialis tenosynovitis at midfoot and medial sesamoiditis     Assessment and Plan: 64 y.o. female with left foot pain.  Multifactorial.  Patient has an abnormal foot shape following her calcaneus fracture.  This has significantly changed the orientation and dynamic motion of her foot with standing and walking.  She has persistent pain and discomfort at the medial aspect of her foot and ankle due to the posterior tibialis tendon which is not in a situation where it has mechanical advantage because of the significant pronation.  At the last visit orthotics were ordered which are in the process of being made.  I do think slight correction of her pronation deformity will help her foot pain a bit.  In the meantime recommend continue conservative management.  Happy to proceed with steroid injection in the future if needed.  Patient would like to avoid steroid injection if possible.  Forefoot pain at first MTP.  Patient's foot is so rolled inwards that she is effectively doing a lot of weightbearing on the medial sesamoid bone  which has become tender and painful.  Recommend offloading with float and orthotics as already ordered.  If not improved also could consider an steroid injection.  Ultimately patient may benefit from surgical second opinion with a foot and ankle surgeon.  She will let me know she like to proceed with that.   PDMP not reviewed this encounter. Orders Placed This Encounter  Procedures  . [place] LIMITED JOINT  SPACE STRUCTURES LOW LEFT(NO LINKED CHARGES)    Order Specific Question:   Reason for Exam (SYMPTOM  OR DIAGNOSIS REQUIRED)    Answer:   L foot pain    Order Specific Question:   Preferred imaging location?    Answer:   [facility]  . DG Foot Complete Left    Standing Status:   Future    Number of Occurrences:   1    Standing Expiration Date:   11/09/2021    Order Specific Question:   Reason for Exam (SYMPTOM  OR DIAGNOSIS REQUIRED)    Answer:   eval foot pain. include sesemoid view    Order Specific Question:   Preferred imaging location?    Answer:   [facility]   No orders of the defined types were placed in this encounter.    Discussed warning signs or symptoms. Please see discharge instructions. Patient expresses understanding.   The above documentation has been reviewed and is accurate and complete Savannah [name], M.D.

## 2020-11-09 NOTE — Patient Instructions (Addendum)
Thank you for coming in today.  Get xray.   If not improved with orthotics I can do an injection or evern move to MRI.   Try that body helix full ankle sleeve.   A second opponens with a foot and ankle orthopedic surgeon is ok as well. Dr Lucia Gaskins at [facility] or Dr Doran Durand at [facility] or Dr Sharol Given at [facility].   Let me know.

## 2020-11-10 NOTE — Telephone Encounter (Signed)
Savannah [name], can you look into this. Orders are in.

## 2020-11-10 NOTE — Progress Notes (Signed)
X-ray left foot shows the calcaneus fracture has healed.  Swelling is present in the ankle.  No acute fractures or severe new deformity.

## 2020-11-12 LAB — VITAMIN B1: Vitamin B1 (Thiamine): 164.5 nmol/L (ref 66.5–200.0)

## 2020-11-30 ENCOUNTER — Other Ambulatory Visit: Payer: Self-pay | Admitting: Endocrinology

## 2020-12-02 ENCOUNTER — Other Ambulatory Visit: Payer: Self-pay

## 2020-12-06 ENCOUNTER — Other Ambulatory Visit: Payer: Self-pay

## 2020-12-06 ENCOUNTER — Ambulatory Visit (INDEPENDENT_AMBULATORY_CARE_PROVIDER_SITE_OTHER): Payer: Medicare Other | Admitting: Endocrinology

## 2020-12-06 VITALS — BP 114/64 | HR 76 | Ht 71.0 in | Wt 172.6 lb

## 2020-12-06 DIAGNOSIS — E1140 Type 2 diabetes mellitus with diabetic neuropathy, unspecified: Secondary | ICD-10-CM

## 2020-12-06 LAB — POCT GLYCOSYLATED HEMOGLOBIN (HGB A1C): Hemoglobin A1C: 6.9 % — AB (ref 4.0–5.6)

## 2020-12-06 NOTE — Progress Notes (Signed)
Subjective:    Patient ID: Savannah [name], female    DOB: [birth year], 65 y.o.   MRN: [id number]  HPI Pt returns for f/u of diabetes mellitus:  DM type: 2 Dx'ed: 3875 Complications: painful PN, and CAD (seen on CT).   Therapy: Trulicity and 3 oral meds.   GDM: never DKA: never Severe hypoglycemia: never.   Pancreatitis: never.  Other: she took insulin from 2004-2011, when she had gastric bypass surgery.  She is retired.   Interval history: She says cbg's are well-controlled.  She has occ lightheadedness--eating or drinking helps.  She takes meds as rx'ed.  Past Medical History:  Diagnosis Date  . ABDOMINAL PAIN, CHRONIC 10/20/2009  . Acute cystitis 08/17/2009  . ALLERGIC RHINITIS 11/21/2007  . Allergy   . ASYMPTOMATIC POSTMENOPAUSAL STATUS 09/29/2008  . Bariatric surgery status 07/07/2010  . Bariatric surgery status 07/07/2010   Qualifier: Diagnosis of  By: Loanne Drilling MD, Jacelyn Pi   . Cancer ([place])    uterine  . Cataract   . Coronary artery disease   . DEPRESSION 11/21/2007  . DIABETES MELLITUS, TYPE II 07/08/2007  . DYSPNEA 05/20/2009  . Edema 05/20/2009  . Eosinophilic esophagitis 04/15/3328  . FEVER UNSPECIFIED 10/20/2009  . FEVER, HX OF 03/09/2010  . GERD (gastroesophageal reflux disease)   . Headache(784.0) 02/06/2010  . Hepatomegaly 01/06/2008  . HYPERLIPIDEMIA 07/08/2007  . HYPERTENSION 07/08/2007  . LEUKOPENIA, MILD 09/29/2008  . OBESITY 01/06/2008  . OSTEOARTHRITIS 01/06/2008  . Other chronic nonalcoholic liver disease 03/30/8415  . PERIPHERAL NEUROPATHY 11/21/2007  . Peripheral neuropathy   . Postsurgical hypothyroidism 09/19/2010  . SINUSITIS- ACUTE-NOS 11/21/2007  . TB SKIN TEST, POSITIVE 02/06/2010  . THYROID NODULE 03/09/2010    Past Surgical History:  Procedure Laterality Date  . ABDOMINAL HYSTERECTOMY    . BASAL CELL CARCINOMA EXCISION    . CARDIAC CATHETERIZATION    . COLONOSCOPY    . EYE SURGERY Bilateral 08/2018, 09/2018  . FOOT SURGERY Left    10/2019  . KNEE ARTHROSCOPY  Left   . LEFT HEART CATH AND CORONARY ANGIOGRAPHY N/A 01/20/2019   Procedure: LEFT HEART CATH AND CORONARY ANGIOGRAPHY;  Surgeon: Belva Crome, MD;  Location: [facility];  Service: Cardiovascular;  Laterality: N/A;  . LYMPH NODE DISSECTION    . OOPHORECTOMY    . Right total knee replacement    . ROUX-EN-Y GASTRIC BYPASS  2011  . SKIN TAG REMOVAL    . THYROIDECTOMY    . TONSILLECTOMY AND ADENOIDECTOMY    . UPPER GASTROINTESTINAL ENDOSCOPY      Social History   Socioeconomic History  . Marital status: Single    Spouse name: Not on file  . Number of children: 0  . Years of education: 16  . Highest education level: Bachelor's degree (e.g., BA, AB, BS)  Occupational History  . Occupation: Optometrist  . Occupation: Retired  Tobacco Use  . Smoking status: Former Smoker    Packs/day: 1.50    Years: 15.00    Pack years: 22.50    Types: Cigarettes    Quit date: 1992    Years since quitting: 30.0  . Smokeless tobacco: Never Used  Vaping Use  . Vaping Use: Never used  Substance and Sexual Activity  . Alcohol use: Yes    Comment: rarely  . Drug use: Never  . Sexual activity: Not Currently  Other Topics Concern  . Not on file  Social History Narrative  . Not on file  Social Determinants of Health   Financial Resource Strain: Low Risk   . Difficulty of Paying Living Expenses: Not hard at all  Food Insecurity: No Food Insecurity  . Worried About Charity fundraiser in the Last Year: Never true  . Ran Out of Food in the Last Year: Never true  Transportation Needs: Unknown  . Lack of Transportation (Medical): Not on file  . Lack of Transportation (Non-Medical): No  Physical Activity: Sufficiently Active  . Days of Exercise per Week: 5 days  . Minutes of Exercise per Session: 60 min  Stress: No Stress Concern Present  . Feeling of Stress : Not at all  Social Connections: Moderately Integrated  . Frequency of Communication with Friends and Family: More than three  times a week  . Frequency of Social Gatherings with Friends and Family: More than three times a week  . Attends Religious Services: 1 to 4 times per year  . Active Member of Clubs or Organizations: Yes  . Attends Archivist Meetings: 1 to 4 times per year  . Marital Status: Never married  Intimate Partner Violence: Not At Risk  . Fear of Current or Ex-Partner: No  . Emotionally Abused: No  . Physically Abused: No  . Sexually Abused: No    Current Outpatient Medications on File Prior to Visit  Medication Sig Dispense Refill  . 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  Correct leg length and improve pronation BL 1 each 0  . amoxicillin (AMOXIL) 500 MG capsule TAKE 4 CAPSULES BY MOUTH ONE HOUR BEFORE DENTAL APPT.    [name] aspirin EC 81 MG tablet Take 1 tablet (81 mg total) by mouth daily. Starting 01/18/19 prior to cardiac cath    . BIOTIN PO Take by mouth.    . canagliflozin (INVOKANA) 300 MG TABS tablet Take 1 tablet (300 mg total) by mouth daily before breakfast. 90 tablet 3  . Cholecalciferol (VITAMIN D3) 125 MCG (5000 UT) CAPS Take 5,000 Units by mouth daily.    . cyclobenzaprine (FLEXERIL) 10 MG tablet Take 10 mg by mouth at bedtime as needed.     . desloratadine (CLARINEX) 5 MG tablet Take 5 mg by mouth daily.    . diclofenac sodium (VOLTAREN) 1 % GEL Apply 4 g topically 4 (four) times daily. (Patient taking differently: Apply 4 g topically as needed (pain).) 100 g 11  . docusate sodium (COLACE) 100 MG capsule Take 200 mg by mouth 2 (two) times daily.    . Dulaglutide (TRULICITY) 3 0000000 SOPN Inject 3 mg as directed once a week. 6 mL 3  . Fluconazole (DIFLUCAN PO) Take by mouth as needed.    . gabapentin (NEURONTIN) 300 MG capsule TAKE 3 CAPSULES (900 MG TOTAL) BY MOUTH 3 (THREE) TIMES DAILY. 360 capsule 2  . halobetasol (ULTRAVATE) 0.05 % cream Apply 1 application topically 2 (two) times daily as needed (lichen sclerosus).   2  .  HYDROcodone-acetaminophen (NORCO/VICODIN) 5-325 MG tablet Take 1 tablet by mouth daily as needed for moderate pain or severe pain.    [name] levothyroxine (SYNTHROID) 112 MCG tablet TAKE 1 TABLET BY MOUTH EVERY DAY 90 tablet 0  . MAGNESIUM PO Take 1 tablet by mouth daily as needed (cramps).     . metFORMIN (GLUCOPHAGE-XR) 500 MG 24 hr tablet Take 4 tablets (2,000 mg total) by mouth daily. 360 tablet 3  . MITIGARE 0.6 MG CAPS TAKE 1 CAPSULE BY MOUTH EVERY DAY 90 capsule 0  .  Multiple Vitamin (MULTIVITAMIN) tablet Take 2 tablets by mouth daily.    . nitroGLYCERIN (NITROSTAT) 0.4 MG SL tablet Place 1 tablet (0.4 mg total) under the tongue every 5 (five) minutes as needed. 25 tablet 3  . ONETOUCH ULTRA test strip USE TO MONITOR GLUCOSE LEVELS ONCE PER DAY E11.40 100 strip 2  . oxyCODONE-acetaminophen (PERCOCET) 5-325 MG tablet Take 1 tablet by mouth every 6 (six) hours as needed for severe pain. 30 tablet 0  . pantoprazole (PROTONIX) 40 MG tablet TAKE 1 TABLET BY MOUTH TWICE A DAY 180 tablet 1  . pantoprazole (PROTONIX) 40 MG tablet Take 1 tablet (40 mg total) by mouth 2 (two) times daily. 180 tablet 1  . phenazopyridine (PYRIDIUM) 100 MG tablet Take 100 mg by mouth 3 (three) times daily as needed for pain.    . Probiotic CAPS Take 1 capsule by mouth daily.    . repaglinide (PRANDIN) 2 MG tablet Take 1 tablet (2 mg total) by mouth 2 (two) times daily before a meal. 180 tablet 3  . traMADol (ULTRAM) 50 MG tablet Take 50 mg by mouth 2 (two) times daily as needed for moderate pain.    [name] atorvastatin (LIPITOR) 40 MG tablet Take 1 tablet (40 mg total) by mouth daily. 90 tablet 3  . furosemide (LASIX) 20 MG tablet Take 1 tablet (20 mg total) by mouth daily. (Patient taking differently: Take 20 mg by mouth daily as needed for fluid. ) 90 tablet 3   No current facility-administered medications on file prior to visit.    Allergies  Allergen Reactions  . Ace Inhibitors Cough  . Caffeine     PVCs  .  Ciprofloxacin Itching  . Morphine And Related Itching    Pt tolerates medication if given with diphenhydramine  . Mucinex [Guaifenesin Er]     PVCs  . Nsaids     Gastric Bypass Surgery - unable to take, tolerates ec 81 aspirin   . Other     Antihistamine-alkylamine - PVCs tolerates benadryl   . Silicone Hives    Family History  Problem Relation Age of Onset  . Multiple sclerosis Sister   . Breast cancer Sister        breast onset age 16  . Osteoporosis Sister   . Diabetes Mother   . Diabetes Father   . Congestive Heart Failure Father   . Hypertension Father   . Myelodysplastic syndrome Father   . Liver cancer Maternal Aunt   . Breast cancer Maternal Aunt   . Diabetes Maternal Aunt   . Colon cancer Maternal Uncle   . Diabetes Maternal Uncle   . Diabetes Maternal Grandmother   . Diabetes Paternal Grandmother     BP 114/64 (BP Location: Right Arm, Patient Position: Sitting, Cuff Size: Normal)   Pulse 76   Ht 5\' 11"  (1.803 m)   Wt 172 lb 9.6 oz (78.3 kg)   SpO2 96%   BMI 24.07 kg/m   Review of Systems Denies n/v    Objective:   Physical Exam VITAL SIGNS:  See vs page GENERAL: no distress Pulses: dorsalis pedis intact bilat.   MSK: no deformity of the feet CV: 2+ left, and 1+ right leg edema Skin:  no ulcer on the feet.  normal color and temp on the feet. Neuro: sensation is intact to touch on the feet, but decreased from normal.    Lab Results  Component Value Date   CREATININE 0.94 10/11/2020   BUN 15 10/11/2020  NA 142 10/11/2020   K 4.2 10/11/2020   CL 107 10/11/2020   CO2 25 10/11/2020   Lab Results  Component Value Date   TSH 0.92 01/14/2020    Lab Results  Component Value Date   HGBA1C 6.9 (A) 12/06/2020       Assessment & Plan:  Type 2 DM, with CAD: well-controlled Lightheadness, due to repaglinide.  I advised her to decrease this, and increase Trulicity.  She declines.    Patient Instructions  Please continue the same 4 diabetes  medications.  check your blood sugar once a day.  vary the time of day when you check, between before the 3 meals, and at bedtime.  also check if you have symptoms of your blood sugar being too high or too low.  please keep a record of the readings and bring it to your next appointment here (or you can bring the meter itself).  You can write it on any piece of paper.  please call us sooner if your blood sugar goes below 70, or if you have a lot of readings over 200. Please come back for a follow-up appointment in 3-4 months.

## 2020-12-06 NOTE — Patient Instructions (Addendum)
Please continue the same 4 diabetes medications.  check your blood sugar once a day.  vary the time of day when you check, between before the 3 meals, and at bedtime.  also check if you have symptoms of your blood sugar being too high or too low.  please keep a record of the readings and bring it to your next appointment here (or you can bring the meter itself).  You can write it on any piece of paper.  please call us sooner if your blood sugar goes below 70, or if you have a lot of readings over 200. Please come back for a follow-up appointment in 3-4 months.

## 2020-12-08 ENCOUNTER — Ambulatory Visit: Payer: Self-pay

## 2020-12-08 ENCOUNTER — Ambulatory Visit (INDEPENDENT_AMBULATORY_CARE_PROVIDER_SITE_OTHER): Payer: Medicare Other

## 2020-12-08 ENCOUNTER — Encounter: Payer: Self-pay | Admitting: Orthopaedic Surgery

## 2020-12-08 ENCOUNTER — Ambulatory Visit (INDEPENDENT_AMBULATORY_CARE_PROVIDER_SITE_OTHER): Payer: Medicare Other | Admitting: Orthopaedic Surgery

## 2020-12-08 ENCOUNTER — Other Ambulatory Visit: Payer: Self-pay

## 2020-12-08 DIAGNOSIS — M79672 Pain in left foot: Secondary | ICD-10-CM

## 2020-12-08 DIAGNOSIS — G8929 Other chronic pain: Secondary | ICD-10-CM

## 2020-12-08 DIAGNOSIS — M25551 Pain in right hip: Secondary | ICD-10-CM

## 2020-12-08 DIAGNOSIS — M25561 Pain in right knee: Secondary | ICD-10-CM | POA: Diagnosis not present

## 2020-12-08 NOTE — Progress Notes (Signed)
Subjective: Patient is here for ultrasound-guided intra-articular right hip injection in June helped for several months.  She has been walking for exercise and her pain has come back.    Objective: She has tenderness over the greater trochanter and pain with internal rotation.  Procedure: Ultrasound guided injection is preferred based studies that show increased duration, increased effect, greater accuracy, decreased procedural pain, increased response rate, and decreased cost with ultrasound guided versus blind injection.   Verbal informed consent obtained.  Time-out conducted.  Noted no overlying erythema, induration, or other signs of local infection. Ultrasound-guided right hip injection: After sterile prep with Betadine, injected 4 cc 0.25% bupivacaine without epinephrine and 6 mg betamethasone using a 22-gauge spinal needle, passing the needle through the iliofemoral ligament into the femoral head/neck junction.  Injectate seen filling the joint capsule.  Good immediate relief.

## 2020-12-08 NOTE — Progress Notes (Signed)
Office Visit Note   Patient: Savannah [name]           Date of Birth: Jan 13, 1956           MRN: [id number] Visit Date: 12/08/2020              Requested by: Vivi Barrack, MD [address] [place],  [place] [address] PCP: Vivi Barrack, MD   Assessment & Plan: Visit Diagnoses:  1. Chronic pain of right knee   2. Right hip pain   3. Left foot pain     Plan: Impression is right knee pain and painful left great toe callus.  For the right knee I recommend strengthening exercises of her quad to help with steps and getting up from chairs.  Reassurance was provided that the total knee replacement looks good on x-ray and on examination.  For the left foot she does have an orthotic that is she is going to pick up that will help with the flatfoot deformity.  We provide her with some doughnuts to try to offload this painful callus.  She was sent upstairs to see Dr. Junius [name] for hip injection.  Follow-Up Instructions: Return if symptoms worsen or fail to improve.   Orders:  Orders Placed This Encounter  Procedures  . XR KNEE 3 VIEW RIGHT  . US Guided Needle Placement - No Linked Charges   No orders of the defined types were placed in this encounter.     Procedures: No procedures performed   Clinical Data: No additional findings.   Subjective: Chief Complaint  Patient presents with  . Right Knee - Pain  . Left Foot - Pain    Ms. Savannah [name] comes in today for evaluation of right knee pain and left foot pain.  For the right knee she had a total knee replacement by Dr. Alma [name] in 1999 and she states that she has periodic flareups in which she has pain above the knee has difficulty getting off of the toilet.  She denies any injuries.  Denies any significant swelling.  She has been dealing with recent iatrogenic left calcaneal fracture which is fortunately healed but has caused deformity to her left foot.  This is caused pain at the base of the first metatarsal head and MTP joint.  She is also  complaining of some right hip pain would like to repeat injection by Dr. Junius [name].   Review of Systems  Constitutional: Negative.   HENT: Negative.   Eyes: Negative.   Respiratory: Negative.   Cardiovascular: Negative.   Endocrine: Negative.   Musculoskeletal: Negative.   Neurological: Negative.   Hematological: Negative.   Psychiatric/Behavioral: Negative.   All other systems reviewed and are negative.    Objective: Vital Signs: There were no vitals taken for this visit.  Physical Exam Vitals and nursing note reviewed.  Constitutional:      Appearance: She is well-developed and well-nourished.  Pulmonary:     Effort: Pulmonary effort is normal.  Skin:    General: Skin is warm.     Capillary Refill: Capillary refill takes less than 2 seconds.  Neurological:     Mental Status: She is alert and oriented to person, place, and time.  Psychiatric:        Mood and Affect: Mood and affect normal.        Behavior: Behavior normal.        Thought Content: Thought content normal.        Judgment: Judgment normal.  Ortho Exam Right knee shows no joint effusion.  Stable to varus valgus.  Fully healed surgical scar.  Painless range of motion of the knee.  No bony tenderness.  Left foot shows a hard callus on the plantar surface of the great toe MTP joint.  She has flatfoot deformity. Specialty Comments:  No specialty comments available.  Imaging: US Guided Needle Placement - No Linked Charges  Result Date: 12/08/2020 Ultrasound guided injection is preferred based studies that show increased duration, increased effect, greater accuracy, decreased procedural pain, increased response rate, and decreased cost with ultrasound guided versus blind injection.   Verbal informed consent obtained.  Time-out conducted.  Noted no overlying erythema, induration, or other signs of local infection. Ultrasound-guided right hip injection: After sterile prep with Betadine, injected 4 cc 0.25%  bupivacaine without epinephrine and 6 mg betamethasone using a 22-gauge spinal needle, passing the needle through the iliofemoral ligament into the femoral head/neck junction.  Injectate seen filling the joint capsule.  Good immediate relief.   XR KNEE 3 VIEW RIGHT  Result Date: 12/08/2020 Stable total knee replacement without complication.    PMFS History: Patient Active Problem List   Diagnosis Date Noted  . Sesamoiditis of left foot 11/09/2020  . Posterior tibial tendinitis of left lower extremity 11/09/2020  . Type 2 diabetes mellitus with diabetic peripheral angiopathy without gangrene, with long-term current use of insulin ([place]) 10/21/2020  . Iron deficiency anemia 03/14/2020  . Deficiency anemia 02/29/2020  . Lesion of vulva 09/02/2019  . Menorrhagia 09/02/2019  . Neuropathy 09/02/2019  . Abdominal pain, epigastric 06/25/2019  . Irregular bowel habits 06/25/2019  . Coronary artery disease involving native coronary artery of native heart without angina pectoris   . Temporomandibular joint disorder 12/05/2018  . Fatigue 12/05/2018  . Inverted nipple 12/05/2018  . Senile purpura ([place]) 11/21/2018  . GERD with esophagitis 11/21/2018  . Pseudogout involving multiple joints 11/21/2018  . Lipoma 07/22/2017  . Lichen sclerosus 68/10/7516  . H/O gastric bypass 11/16/2016  . GERD (gastroesophageal reflux disease) 11/16/2016  . Depression 11/16/2016  . Diabetic polyneuropathy associated with type 2 diabetes mellitus ([place]) 11/16/2016  . Hyperlipidemia 11/16/2016  . Left lower quadrant pain 11/16/2016  . Uterine cancer [facility]) s/p hysterectomy 1997 01/04/2012  . Postsurgical hypothyroidism 09/19/2010  . Generalized abdominal pain 10/20/2009  . Diabetic neuropathy ([place]) 11/21/2007  . Allergic rhinitis 11/21/2007  . Type 2 diabetes mellitus with diabetic neuropathy, unspecified ([place]) 07/08/2007  . Dyslipidemia associated with type 2 diabetes mellitus ([place]) 07/08/2007   Past Medical  History:  Diagnosis Date  . ABDOMINAL PAIN, CHRONIC 10/20/2009  . Acute cystitis 08/17/2009  . ALLERGIC RHINITIS 11/21/2007  . Allergy   . ASYMPTOMATIC POSTMENOPAUSAL STATUS 09/29/2008  . Bariatric surgery status 07/07/2010  . Bariatric surgery status 07/07/2010   Qualifier: Diagnosis of  By: Loanne Drilling MD, Jacelyn Pi   . Cancer ([place])    uterine  . Cataract   . Coronary artery disease   . DEPRESSION 11/21/2007  . DIABETES MELLITUS, TYPE II 07/08/2007  . DYSPNEA 05/20/2009  . Edema 05/20/2009  . Eosinophilic esophagitis 0/0/1749  . FEVER UNSPECIFIED 10/20/2009  . FEVER, HX OF 03/09/2010  . GERD (gastroesophageal reflux disease)   . Headache(784.0) 02/06/2010  . Hepatomegaly 01/06/2008  . HYPERLIPIDEMIA 07/08/2007  . HYPERTENSION 07/08/2007  . LEUKOPENIA, MILD 09/29/2008  . OBESITY 01/06/2008  . OSTEOARTHRITIS 01/06/2008  . Other chronic nonalcoholic liver disease 4/49/6759  . PERIPHERAL NEUROPATHY 11/21/2007  . Peripheral neuropathy   . Postsurgical  hypothyroidism 09/19/2010  . SINUSITIS- ACUTE-NOS 11/21/2007  . TB SKIN TEST, POSITIVE 02/06/2010  . THYROID NODULE 03/09/2010    Family History  Problem Relation Age of Onset  . Multiple sclerosis Sister   . Breast cancer Sister        breast onset age 50  . Osteoporosis Sister   . Diabetes Mother   . Diabetes Father   . Congestive Heart Failure Father   . Hypertension Father   . Myelodysplastic syndrome Father   . Liver cancer Maternal Aunt   . Breast cancer Maternal Aunt   . Diabetes Maternal Aunt   . Colon cancer Maternal Uncle   . Diabetes Maternal Uncle   . Diabetes Maternal Grandmother   . Diabetes Paternal Grandmother     Past Surgical History:  Procedure Laterality Date  . ABDOMINAL HYSTERECTOMY    . BASAL CELL CARCINOMA EXCISION    . CARDIAC CATHETERIZATION    . COLONOSCOPY    . EYE SURGERY Bilateral 08/2018, 09/2018  . FOOT SURGERY Left    10/2019  . KNEE ARTHROSCOPY Left   . LEFT HEART CATH AND CORONARY ANGIOGRAPHY N/A 01/20/2019    Procedure: LEFT HEART CATH AND CORONARY ANGIOGRAPHY;  Surgeon: Belva Crome, MD;  Location: [facility];  Service: Cardiovascular;  Laterality: N/A;  . LYMPH NODE DISSECTION    . OOPHORECTOMY    . Right total knee replacement    . ROUX-EN-Y GASTRIC BYPASS  2011  . SKIN TAG REMOVAL    . THYROIDECTOMY    . TONSILLECTOMY AND ADENOIDECTOMY    . UPPER GASTROINTESTINAL ENDOSCOPY     Social History   Occupational History  . Occupation: Optometrist  . Occupation: Retired  Tobacco Use  . Smoking status: Former Smoker    Packs/day: 1.50    Years: 15.00    Pack years: 22.50    Types: Cigarettes    Quit date: 1992    Years since quitting: 30.0  . Smokeless tobacco: Never Used  Vaping Use  . Vaping Use: Never used  Substance and Sexual Activity  . Alcohol use: Yes    Comment: rarely  . Drug use: Never  . Sexual activity: Not Currently

## 2020-12-11 ENCOUNTER — Other Ambulatory Visit: Payer: Self-pay | Admitting: Physician Assistant

## 2020-12-11 NOTE — Telephone Encounter (Signed)
Last Visit: 08/16/2020 Next Visit: 02/13/2021 Labs: 11/08/2020, CBC w/diff, WBC 3.5, MCV 102.0, 10/11/2020 CMP, Glucose 119, Total Protein 5.6, Globulin 1.8, AST 49, ALT 66  Current Dose per office note 08/16/2020, not mentioned at visit DX: Inflammatory arthritis   Okay to refill Mitig[employer]?

## 2020-12-16 ENCOUNTER — Encounter (HOSPITAL_COMMUNITY): Payer: Self-pay

## 2020-12-16 ENCOUNTER — Emergency Department (HOSPITAL_COMMUNITY): Payer: Medicare Other

## 2020-12-16 ENCOUNTER — Other Ambulatory Visit: Payer: Self-pay

## 2020-12-16 ENCOUNTER — Emergency Department (HOSPITAL_COMMUNITY)
Admission: EM | Admit: 2020-12-16 | Discharge: 2020-12-16 | Disposition: A | Discharge status: Home / Self Care | Payer: Medicare Other | Attending: Emergency Medicine | Admitting: Emergency Medicine

## 2020-12-16 DIAGNOSIS — I6782 Cerebral ischemia: Secondary | ICD-10-CM | POA: Diagnosis not present

## 2020-12-16 DIAGNOSIS — Z87891 Personal history of nicotine dependence: Secondary | ICD-10-CM | POA: Diagnosis not present

## 2020-12-16 DIAGNOSIS — E1142 Type 2 diabetes mellitus with diabetic polyneuropathy: Secondary | ICD-10-CM | POA: Insufficient documentation

## 2020-12-16 DIAGNOSIS — I2510 Atherosclerotic heart disease of native coronary artery without angina pectoris: Secondary | ICD-10-CM | POA: Diagnosis not present

## 2020-12-16 DIAGNOSIS — S3991XA Unspecified injury of abdomen, initial encounter: Secondary | ICD-10-CM | POA: Diagnosis not present

## 2020-12-16 DIAGNOSIS — S2241XA Multiple fractures of ribs, right side, initial encounter for closed fracture: Secondary | ICD-10-CM | POA: Diagnosis not present

## 2020-12-16 DIAGNOSIS — S2231XA Fracture of one rib, right side, initial encounter for closed fracture: Secondary | ICD-10-CM | POA: Insufficient documentation

## 2020-12-16 DIAGNOSIS — Z7984 Long term (current) use of oral hypoglycemic drugs: Secondary | ICD-10-CM | POA: Insufficient documentation

## 2020-12-16 DIAGNOSIS — W01198A Fall on same level from slipping, tripping and stumbling with subsequent striking against other object, initial encounter: Secondary | ICD-10-CM | POA: Insufficient documentation

## 2020-12-16 DIAGNOSIS — I10 Essential (primary) hypertension: Secondary | ICD-10-CM | POA: Diagnosis not present

## 2020-12-16 DIAGNOSIS — Z7982 Long term (current) use of aspirin: Secondary | ICD-10-CM | POA: Diagnosis not present

## 2020-12-16 DIAGNOSIS — Z8542 Personal history of malignant neoplasm of other parts of uterus: Secondary | ICD-10-CM | POA: Insufficient documentation

## 2020-12-16 DIAGNOSIS — R109 Unspecified abdominal pain: Secondary | ICD-10-CM | POA: Diagnosis not present

## 2020-12-16 DIAGNOSIS — R55 Syncope and collapse: Secondary | ICD-10-CM | POA: Insufficient documentation

## 2020-12-16 DIAGNOSIS — Y92002 Bathroom of unspecified non-institutional (private) residence single-family (private) house as the place of occurrence of the external cause: Secondary | ICD-10-CM | POA: Insufficient documentation

## 2020-12-16 DIAGNOSIS — R42 Dizziness and giddiness: Secondary | ICD-10-CM | POA: Diagnosis not present

## 2020-12-16 DIAGNOSIS — S3993XA Unspecified injury of pelvis, initial encounter: Secondary | ICD-10-CM | POA: Diagnosis not present

## 2020-12-16 DIAGNOSIS — S299XXA Unspecified injury of thorax, initial encounter: Secondary | ICD-10-CM | POA: Diagnosis present

## 2020-12-16 DIAGNOSIS — S0990XA Unspecified injury of head, initial encounter: Secondary | ICD-10-CM | POA: Diagnosis not present

## 2020-12-16 DIAGNOSIS — W19XXXA Unspecified fall, initial encounter: Secondary | ICD-10-CM

## 2020-12-16 LAB — COMPREHENSIVE METABOLIC PANEL
ALT: 51 U/L — ABNORMAL HIGH (ref 0–44)
AST: 32 U/L (ref 15–41)
Albumin: 4.7 g/dL (ref 3.5–5.0)
Alkaline Phosphatase: 98 U/L (ref 38–126)
Anion gap: 14 (ref 5–15)
BUN: 25 mg/dL — ABNORMAL HIGH (ref 8–23)
CO2: 22 mmol/L (ref 22–32)
Calcium: 9.3 mg/dL (ref 8.9–10.3)
Chloride: 104 mmol/L (ref 98–111)
Creatinine, Ser: 0.97 mg/dL (ref 0.44–1.00)
GFR, Estimated: >60 mL/min (ref >60)
Glucose, Bld: 137 mg/dL — ABNORMAL HIGH (ref 70–99)
Potassium: 4.1 mmol/L (ref 3.5–5.1)
Sodium: 140 mmol/L (ref 135–145)
Total Bilirubin: 0.8 mg/dL (ref 0.3–1.2)
Total Protein: 7.6 g/dL (ref 6.5–8.1)

## 2020-12-16 LAB — CBC WITH DIFFERENTIAL/PLATELET
Abs Immature Granulocytes: 0.07 10*3/uL (ref 0.00–0.07)
Basophils Absolute: 0 10*3/uL (ref 0.0–0.1)
Basophils Relative: 0 %
Eosinophils Absolute: 0.2 10*3/uL (ref 0.0–0.5)
Eosinophils Relative: 2 %
HCT: 48.2 % — ABNORMAL HIGH (ref 36.0–46.0)
Hemoglobin: 15.1 g/dL — ABNORMAL HIGH (ref 12.0–15.0)
Immature Granulocytes: 1 %
Lymphocytes Relative: 18 %
Lymphs Abs: 1.5 10*3/uL (ref 0.7–4.0)
MCH: 32.8 pg (ref 26.0–34.0)
MCHC: 31.3 g/dL (ref 30.0–36.0)
MCV: 104.6 fL — ABNORMAL HIGH (ref 80.0–100.0)
Monocytes Absolute: 0.5 10*3/uL (ref 0.1–1.0)
Monocytes Relative: 7 %
Neutro Abs: 6 10*3/uL (ref 1.7–7.7)
Neutrophils Relative %: 72 %
Platelets: 146 10*3/uL — ABNORMAL LOW (ref 150–400)
RBC: 4.61 MIL/uL (ref 3.87–5.11)
RDW: 13.3 % (ref 11.5–15.5)
WBC: 8.3 10*3/uL (ref 4.0–10.5)
nRBC: 0 % (ref 0.0–0.2)

## 2020-12-16 LAB — CBG MONITORING, ED: Glucose-Capillary: 111 mg/dL — ABNORMAL HIGH (ref 70–99)

## 2020-12-16 LAB — URINALYSIS, ROUTINE W REFLEX MICROSCOPIC
Bacteria, UA: NONE SEEN
Bilirubin Urine: NEGATIVE
Glucose, UA: >=500 mg/dL — AB
Hgb urine dipstick: NEGATIVE
Ketones, ur: NEGATIVE mg/dL
Leukocytes,Ua: NEGATIVE
Nitrite: NEGATIVE
Protein, ur: NEGATIVE mg/dL
Specific Gravity, Urine: 1.027 (ref 1.005–1.030)
pH: 5 (ref 5.0–8.0)

## 2020-12-16 LAB — TROPONIN I (HIGH SENSITIVITY)
Troponin I (High Sensitivity): 27 ng/L — ABNORMAL HIGH (ref <18)
Troponin I (High Sensitivity): 29 ng/L — ABNORMAL HIGH (ref <18)

## 2020-12-16 MED ORDER — HYDROMORPHONE HCL 1 MG/ML IJ SOLN
1.0000 mg | Freq: Once | INTRAMUSCULAR | Status: AC
  Administered 2020-12-16: 1 mg via INTRAVENOUS
  Filled 2020-12-16: qty 1

## 2020-12-16 MED ORDER — HYDROCODONE-ACETAMINOPHEN 10-325 MG PO TABS: 1.0000 | ORAL_TABLET | Freq: Four times a day (QID) | ORAL | 0 refills | Status: DC | PRN

## 2020-12-16 MED ORDER — HYDROCODONE-ACETAMINOPHEN 5-325 MG PO TABS
2.0000 | ORAL_TABLET | Freq: Once | ORAL | Status: AC
  Administered 2020-12-16: 2 via ORAL
  Filled 2020-12-16: qty 2

## 2020-12-16 MED ORDER — IOHEXOL 300 MG/ML  SOLN
100.0000 mL | Freq: Once | INTRAMUSCULAR | Status: AC | PRN
  Administered 2020-12-16: 100 mL via INTRAVENOUS

## 2020-12-16 NOTE — ED Triage Notes (Signed)
Pt presents with c/o fall that occurred earlier today. Pt reports she went to the bathroom and became dizzy and fell onto her back. Pt also reports she was diabetic but her CBG was 135 after the dizzy spell. Pt reports she fell onto her back and her back and abdomen are now sore. Pt ambulatory to triage, no complaints of dizziness at this time.

## 2020-12-16 NOTE — ED Notes (Signed)
Lab contacted and repeat troponin sent down.

## 2020-12-16 NOTE — ED Provider Notes (Addendum)
[facility] DEPT Provider Note   CSN: [id number] Arrival date & time: 12/16/20  1308     History Chief Complaint  Patient presents with  . Savannah [name] is a 65 y.o. female.  HPI 65 year old female who presents to the ER with complaints of dizziness, and syncopal episode.  She states that she has diabetes, started to feel slightly dizzy and thought that this may be due to low blood sugar.  She was in the process of checking her blood sugar in the bathroom, when she thinks she lost consciousness, fell backwards, and hit her back on the toilet.  States that the toilet broke.  She does not know how long she was unconscious.  She states that when she was able to get back up, she worked in her blood sugar was at 135.  She complains of severe pain "all over", however when further provided she endorses in her chest/abdomen which goes into her back.  Denies any numbness or tingling her extremities, but does have a lot of pain with movement.  Denies any shortness of breath but does have some pain on inspiration.  Has a mild headache, no vision changes, nausea, vomiting.  No neck pain.  She reports a history of hypotension and states that she did take some Lasix yesterday because her legs were more swollen.    Past Medical History:  Diagnosis Date  . ABDOMINAL PAIN, CHRONIC 10/20/2009  . Acute cystitis 08/17/2009  . ALLERGIC RHINITIS 11/21/2007  . Allergy   . ASYMPTOMATIC POSTMENOPAUSAL STATUS 09/29/2008  . Bariatric surgery status 07/07/2010  . Bariatric surgery status 07/07/2010   Qualifier: Diagnosis of  By: Loanne Drilling MD, Jacelyn Pi   . Cancer ([place])    uterine  . Cataract   . Coronary artery disease   . DEPRESSION 11/21/2007  . DIABETES MELLITUS, TYPE II 07/08/2007  . DYSPNEA 05/20/2009  . Edema 05/20/2009  . Eosinophilic esophagitis 4/0/9811  . FEVER UNSPECIFIED 10/20/2009  . FEVER, HX OF 03/09/2010  . GERD (gastroesophageal reflux disease)   . Headache(784.0)  02/06/2010  . Hepatomegaly 01/06/2008  . HYPERLIPIDEMIA 07/08/2007  . HYPERTENSION 07/08/2007  . LEUKOPENIA, MILD 09/29/2008  . OBESITY 01/06/2008  . OSTEOARTHRITIS 01/06/2008  . Other chronic nonalcoholic liver disease 07/26/7828  . PERIPHERAL NEUROPATHY 11/21/2007  . Peripheral neuropathy   . Postsurgical hypothyroidism 09/19/2010  . SINUSITIS- ACUTE-NOS 11/21/2007  . TB SKIN TEST, POSITIVE 02/06/2010  . THYROID NODULE 03/09/2010    Patient Active Problem List   Diagnosis Date Noted  . Sesamoiditis of left foot 11/09/2020  . Posterior tibial tendinitis of left lower extremity 11/09/2020  . Type 2 diabetes mellitus with diabetic peripheral angiopathy without gangrene, with long-term current use of insulin ([place]) 10/21/2020  . Iron deficiency anemia 03/14/2020  . Deficiency anemia 02/29/2020  . Lesion of vulva 09/02/2019  . Menorrhagia 09/02/2019  . Neuropathy 09/02/2019  . Abdominal pain, epigastric 06/25/2019  . Irregular bowel habits 06/25/2019  . Coronary artery disease involving native coronary artery of native heart without angina pectoris   . Temporomandibular joint disorder 12/05/2018  . Fatigue 12/05/2018  . Inverted nipple 12/05/2018  . Senile purpura ([place]) 11/21/2018  . GERD with esophagitis 11/21/2018  . Pseudogout involving multiple joints 11/21/2018  . Lipoma 07/22/2017  . Lichen sclerosus 56/21/3086  . H/O gastric bypass 11/16/2016  . GERD (gastroesophageal reflux disease) 11/16/2016  . Depression 11/16/2016  . Diabetic polyneuropathy associated with type 2 diabetes mellitus ([place]) 11/16/2016  .  Hyperlipidemia 11/16/2016  . Left lower quadrant pain 11/16/2016  . Uterine cancer [facility]) s/p hysterectomy 1997 01/04/2012  . Postsurgical hypothyroidism 09/19/2010  . Generalized abdominal pain 10/20/2009  . Diabetic neuropathy ([place]) 11/21/2007  . Allergic rhinitis 11/21/2007  . Type 2 diabetes mellitus with diabetic neuropathy, unspecified ([place]) 07/08/2007  . Dyslipidemia  associated with type 2 diabetes mellitus ([place]) 07/08/2007    Past Surgical History:  Procedure Laterality Date  . ABDOMINAL HYSTERECTOMY    . BASAL CELL CARCINOMA EXCISION    . CARDIAC CATHETERIZATION    . COLONOSCOPY    . EYE SURGERY Bilateral 08/2018, 09/2018  . FOOT SURGERY Left    10/2019  . KNEE ARTHROSCOPY Left   . LEFT HEART CATH AND CORONARY ANGIOGRAPHY N/A 01/20/2019   Procedure: LEFT HEART CATH AND CORONARY ANGIOGRAPHY;  Surgeon: Belva Crome, MD;  Location: [facility];  Service: Cardiovascular;  Laterality: N/A;  . LYMPH NODE DISSECTION    . OOPHORECTOMY    . Right total knee replacement    . ROUX-EN-Y GASTRIC BYPASS  2011  . SKIN TAG REMOVAL    . THYROIDECTOMY    . TONSILLECTOMY AND ADENOIDECTOMY    . UPPER GASTROINTESTINAL ENDOSCOPY       OB History   No obstetric history on file.     Family History  Problem Relation Age of Onset  . Multiple sclerosis Sister   . Breast cancer Sister        breast onset age 60  . Osteoporosis Sister   . Diabetes Mother   . Diabetes Father   . Congestive Heart Failure Father   . Hypertension Father   . Myelodysplastic syndrome Father   . Liver cancer Maternal Aunt   . Breast cancer Maternal Aunt   . Diabetes Maternal Aunt   . Colon cancer Maternal Uncle   . Diabetes Maternal Uncle   . Diabetes Maternal Grandmother   . Diabetes Paternal Grandmother     Social History   Tobacco Use  . Smoking status: Former Smoker    Packs/day: 1.50    Years: 15.00    Pack years: 22.50    Types: Cigarettes    Quit date: 1992    Years since quitting: 30.1  . Smokeless tobacco: Never Used  Vaping Use  . Vaping Use: Never used  Substance Use Topics  . Alcohol use: Yes    Comment: rarely  . Drug use: Never    Home Medications Prior to Admission medications   Medication Sig Start Date End Date Taking? Authorizing Provider  aspirin EC 81 MG tablet Take 1 tablet (81 mg total) by mouth daily. Starting 01/18/19 prior to  cardiac cath 01/18/19  Yes Elouise Munroe, MD  atorvastatin (LIPITOR) 40 MG tablet Take 40 mg by mouth every evening.   Yes [provider]  canagliflozin (INVOKANA) 300 MG TABS tablet Take 1 tablet (300 mg total) by mouth daily before breakfast. 01/14/20  Yes Renato Shin, MD  Cholecalciferol (VITAMIN D) 50 MCG (2000 UT) tablet Take 4,000 Units by mouth daily.   Yes [provider]  cyclobenzaprine (FLEXERIL) 10 MG tablet Take 10 mg by mouth at bedtime as needed for muscle spasms. 07/06/20  Yes [provider]  desloratadine (CLARINEX) 5 MG tablet Take 5 mg by mouth daily.   Yes [provider]  docusate sodium (COLACE) 100 MG capsule Take 200 mg by mouth 2 (two) times daily.   Yes [provider]  Dulaglutide (TRULICITY) 3  MG/0.5ML SOPN Inject 3 mg as directed once a week. 09/05/20  Yes Renato Shin, MD  furosemide (LASIX) 20 MG tablet Take 20 mg by mouth daily as needed for fluid (swelling).   Yes [provider]  gabapentin (NEURONTIN) 300 MG capsule TAKE 3 CAPSULES (900 MG TOTAL) BY MOUTH 3 (THREE) TIMES DAILY. 09/12/20  Yes Vivi Barrack, MD  halobetasol (ULTRAVATE) 0.05 % cream Apply 1 application topically 2 (two) times daily as needed (lichen sclerosus).  01/26/17  Yes [provider]  HYDROcodone-acetaminophen (NORCO) 10-325 MG tablet Take 1 tablet by mouth every 6 (six) hours as needed for up to 3 days. 12/16/20 12/19/20 Yes Garald Balding, PA-C  levothyroxine (SYNTHROID) 112 MCG tablet TAKE 1 TABLET BY MOUTH EVERY DAY 11/30/20  Yes Renato Shin, MD  MAGNESIUM PO Take 1 tablet by mouth daily as needed (cramps).    Yes [provider]  metFORMIN (GLUCOPHAGE-XR) 500 MG 24 hr tablet Take 4 tablets (2,000 mg total) by mouth daily. Patient taking differently: Take 1,000 mg by mouth in the morning and at bedtime. 05/04/20  Yes Renato Shin, MD  MITIGARE 0.6 MG CAPS TAKE 1 CAPSULE BY MOUTH EVERY DAY 12/12/20  Yes Ofilia Neas,  PA-C  Multiple Vitamin (MULTIVITAMIN) tablet Take 1 tablet by mouth in the morning and at bedtime.   Yes [provider]  pantoprazole (PROTONIX) 40 MG tablet TAKE 1 TABLET BY MOUTH TWICE A DAY 10/21/20  Yes Armbruster, Carlota Raspberry, MD  Probiotic CAPS Take 1 capsule by mouth daily.   Yes [provider]  repaglinide (PRANDIN) 2 MG tablet Take 1 tablet (2 mg total) by mouth 2 (two) times daily before a meal. 09/05/20  Yes Renato Shin, MD  traMADol (ULTRAM) 50 MG tablet Take 50 mg by mouth 2 (two) times daily as needed for moderate pain.   Yes [provider]  AMBULATORY NON [place] at [employer]  Correct leg length and improve pronation BL 10/03/20   Gregor Hams, MD  amoxicillin (AMOXIL) 500 MG capsule TAKE 4 CAPSULES BY MOUTH ONE HOUR BEFORE DENTAL APPT. 03/06/20   [provider]  atorvastatin (LIPITOR) 40 MG tablet Take 1 tablet (40 mg total) by mouth daily. 06/02/20 09/06/20  Elouise Munroe, MD  diclofenac sodium (VOLTAREN) 1 % GEL Apply 4 g topically 4 (four) times daily. Patient taking differently: Apply 4 g topically as needed (pain). 09/06/17   Renato Shin, MD  Fluconazole (DIFLUCAN PO) Take by mouth as needed.    [provider]  furosemide (LASIX) 20 MG tablet Take 1 tablet (20 mg total) by mouth daily. Patient taking differently: Take 20 mg by mouth daily as needed for fluid. 06/26/19 08/16/20  Elouise Munroe, MD  nitroGLYCERIN (NITROSTAT) 0.4 MG SL tablet Place 1 tablet (0.4 mg total) under the tongue every 5 (five) minutes as needed. 09/06/20   Elouise Munroe, MD  [facility] ULTRA test strip USE TO MONITOR GLUCOSE LEVELS ONCE PER DAY E11.40 03/05/20   Renato Shin, MD  pantoprazole (PROTONIX) 40 MG tablet Take 1 tablet (40 mg total) by mouth 2 (two) times daily. 10/21/20   Vivi Barrack, MD  phenazopyridine (PYRIDIUM) 100 MG tablet Take 100 mg by mouth 3 (three) times daily as needed for pain.     [provider]    Allergies    Ace inhibitors, Caffeine, Ciprofloxacin, Morphine and related, Mucinex [guaifenesin er], Nsaids, Other, and Silicone  Review of Systems  Review of Systems  Constitutional: Negative for chills and fever.  HENT: Negative for ear pain and sore throat.   Eyes: Negative for pain and visual disturbance.  Respiratory: Negative for cough and shortness of breath.   Cardiovascular: Positive for chest pain. Negative for palpitations.  Gastrointestinal: Positive for abdominal pain. Negative for vomiting.  Genitourinary: Negative for dysuria and hematuria.  Musculoskeletal: Positive for back pain. Negative for arthralgias.  Skin: Negative for color change and rash.  Neurological: Negative for seizures and syncope.  All other systems reviewed and are negative.   Physical Exam Updated Vital Signs BP 121/63   Pulse 69   Temp 97.9 F (36.6 C) (Oral)   Resp 19   SpO2 98%   Physical Exam Vitals and nursing note reviewed.  Constitutional:      General: She is not in acute distress.    Appearance: She is well-developed and well-nourished. She is not ill-appearing or diaphoretic.  HENT:     Head: Normocephalic and atraumatic.  Eyes:     Conjunctiva/sclera: Conjunctivae normal.  Cardiovascular:     Rate and Rhythm: Normal rate and regular rhythm.     Pulses: Normal pulses.     Heart sounds: No murmur heard.   Pulmonary:     Effort: Pulmonary effort is normal. No respiratory distress.     Breath sounds: Normal breath sounds.  Abdominal:     Palpations: Abdomen is soft.     Tenderness: There is no abdominal tenderness.  Musculoskeletal:        General: No edema.     Cervical back: Normal range of motion and neck supple.     Comments: Paraspinal muscle tenderness of thoracic spine.  No C, T, L-spine tenderness.  Moving all 4 extremities without difficulty.  Neurovascularly intact.  Full range of motion of neck.  No visible step-offs, crepitus,  deformities or bruising.  No raccoon eyes, battle sign.  No step-offs, deformities to the skull.  No visible bruising to the chest wall, or abdomen.  She has some tenderness to palpation to the right lateral chest wall and sternum.  Skin:    General: Skin is warm and dry.  Neurological:     General: No focal deficit present.     Mental Status: She is alert and oriented to person, place, and time.     Sensory: No sensory deficit.     Motor: No weakness.     Comments: Mental Status:  Alert, thought content appropriate, able to give a coherent history. Speech fluent without evidence of aphasia. Able to follow 2 step commands without difficulty.  Cranial Nerves:  II: Peripheral visual fields grossly normal, pupils equal, round, reactive to light III,IV, VI: ptosis not present, extra-ocular motions intact bilaterally  V,VII: smile symmetric, facial light touch sensation equal VIII: hearing grossly normal to voice  X: uvula elevates symmetrically  XI: bilateral shoulder shrug symmetric and strong XII: midline tongue extension without fassiculations Motor:  Normal tone. 5/5 strength of BUE and BLE major muscle groups including strong and equal grip strength and dorsiflexion/plantar flexion Sensory: light touch normal in all extremities. Cerebellar: normal finger-to-nose with bilateral upper extremities, Romberg sign absent Gait:Limited ambulation secondary to pain   Psychiatric:        Mood and Affect: Mood and affect and mood normal.        Behavior: Behavior normal.     ED Results / Procedures / Treatments   Labs (all labs ordered are listed, but only abnormal results  are displayed) Labs Reviewed  CBC WITH DIFFERENTIAL/PLATELET - Abnormal; Notable for the following components:      Result Value   Hemoglobin 15.1 (*)    HCT 48.2 (*)    MCV 104.6 (*)    Platelets 146 (*)    All other components within normal limits  COMPREHENSIVE METABOLIC PANEL - Abnormal; Notable for the following  components:   Glucose, Bld 137 (*)    BUN 25 (*)    ALT 51 (*)    All other components within normal limits  URINALYSIS, ROUTINE W REFLEX MICROSCOPIC - Abnormal; Notable for the following components:   Glucose, UA >=500 (*)    All other components within normal limits  CBG MONITORING, ED - Abnormal; Notable for the following components:   Glucose-Capillary 111 (*)    All other components within normal limits  TROPONIN I (HIGH SENSITIVITY) - Abnormal; Notable for the following components:   Troponin I (High Sensitivity) 27 (*)    All other components within normal limits  TROPONIN I (HIGH SENSITIVITY) - Abnormal; Notable for the following components:   Troponin I (High Sensitivity) 29 (*)    All other components within normal limits  CBC WITH DIFFERENTIAL/PLATELET    EKG EKG Interpretation  Date/Time:  Friday December 16 2020 15:50:16 EST Ventricular Rate:  71 PR Interval:    QRS Duration: 126 QT Interval:  438 QTC Calculation: 476 R Axis:   1 Text Interpretation: Sinus rhythm Nonspecific intraventricular conduction delay Confirmed by Davonna Belling [phone]) on 12/16/2020 3:56:02 PM   Radiology CT Head Wo Contrast  Result Date: 12/16/2020 CLINICAL DATA:  65 year old female with head trauma. EXAM: CT HEAD WITHOUT CONTRAST TECHNIQUE: Contiguous axial images were obtained from the base of the skull through the vertex without intravenous contrast. COMPARISON:  Head CT dated 04/24/2010. FINDINGS: Brain: The ventricles and sulci appropriate size for patient's age. Mild periventricular and deep white matter chronic microvascular ischemic changes noted. There is no acute intracranial hemorrhage. No mass effect midline shift. No extra-axial fluid collection. Vascular: No hyperdense vessel or unexpected calcification. Skull: Normal. Negative for fracture or focal lesion. Sinuses/Orbits: No acute finding. Other: None IMPRESSION: 1. No acute intracranial pathology. 2. Mild chronic microvascular  ischemic changes. Electronically Signed   By: Anner Crete M.D.   On: 12/16/2020 17:31   CT CHEST ABDOMEN PELVIS W CONTRAST  Addendum Date: 12/16/2020   ADDENDUM REPORT: 12/16/2020 21:27 ADDENDUM: Please note there are also fractures of the lateral left fourth-seventh ribs. Electronically Signed   By: Anner Crete M.D.   On: 12/16/2020 21:27   Result Date: 12/16/2020 CLINICAL DATA:  65 year old female with blunt trauma. EXAM: CT CHEST, ABDOMEN, AND PELVIS WITH CONTRAST TECHNIQUE: Multidetector CT imaging of the chest, abdomen and pelvis was performed following the standard protocol during bolus administration of intravenous contrast. CONTRAST:  112mL OMNIPAQUE IOHEXOL 300 MG/ML  SOLN COMPARISON:  CT abdomen pelvis dated 07/09/2019. FINDINGS: CT CHEST FINDINGS Cardiovascular: There is no cardiomegaly or pericardial effusion. There is a 3 vessel coronary vascular calcification. Mild atherosclerotic calcification of the thoracic aorta. No aneurysmal dilatation or dissection. The origins of the great vessels of the aortic arch appear patent as visualized. The central pulmonary arteries are patent. Mediastinum/Nodes: Calcified hilar and mediastinal granuloma. The esophagus is grossly unremarkable. Thyroidectomy. No mediastinal fluid collection. Lungs/Pleura: Minimal bibasilar atelectasis/scarring. Small scattered calcified granuloma. No focal consolidation, pleural effusion, pneumothorax. A 2 cm right apical pneumatocele. The central airways are patent. Musculoskeletal: There is a displaced fracture  of the posterior right seventh rib. CT ABDOMEN PELVIS FINDINGS No intra-abdominal free air or free fluid. Hepatobiliary: The liver is unremarkable. No intrahepatic biliary dilatation. The gallbladder is distended. Layering small stones noted within the gallbladder. No pericholecystic fluid or evidence of acute cholecystitis by CT. Pancreas: Unremarkable. No pancreatic ductal dilatation or surrounding inflammatory  changes. Spleen: Small calcified granuloma. Adrenals/Urinary Tract: The adrenal glands unremarkable. Mild bilateral renal parenchyma atrophy, left greater right. There is no hydronephrosis on either side. 1 cm right renal cyst. The visualized ureters and urinary bladder appear unremarkable. Stomach/Bowel: There is large amount of stool throughout the colon. There is no bowel obstruction or active inflammation. Postsurgical changes of gastric bypass. The appendix is not visualized with certainty. No inflammatory changes identified in the right lower quadrant. Vascular/Lymphatic: Mild aortoiliac atherosclerotic disease. The IVC is unremarkable. No portal venous gas. There is no adenopathy. Reproductive: Hysterectomy.  No adnexal masses. Other: None Musculoskeletal: Osteopenia with degenerative changes of the spine. No acute osseous pathology. IMPRESSION: 1. Displaced fracture of the posterior right seventh rib. No pneumothorax. 2. No acute/traumatic intra-abdominal or pelvic pathology. 3. Cholelithiasis. 4. Aortic Atherosclerosis (ICD10-I70.0). Electronically Signed: By: Anner Crete M.D. On: 12/16/2020 17:41   DG Chest Portable 1 View  Result Date: 12/16/2020 CLINICAL DATA:  [place] earlier today, dizziness EXAM: PORTABLE CHEST 1 VIEW COMPARISON:  02/26/2020 FINDINGS: The heart size and mediastinal contours are within normal limits. Both lungs are clear. The visualized skeletal structures are unremarkable. IMPRESSION: No active disease. Electronically Signed   By: Randa Ngo M.D.   On: 12/16/2020 15:51    Procedures Procedures   Medications Ordered in ED Medications  HYDROmorphone (DILAUDID) injection 1 mg (1 mg Intravenous Given 12/16/20 1538)  iohexol (OMNIPAQUE) 300 MG/ML solution 100 mL (100 mLs Intravenous Contrast Given 12/16/20 1710)  HYDROmorphone (DILAUDID) injection 1 mg (1 mg Intravenous Given 12/16/20 1753)  HYDROcodone-acetaminophen (NORCO/VICODIN) 5-325 MG per tablet 2 tablet (2 tablets  Oral Given 12/16/20 2118)    ED Course  I have reviewed the triage vital signs and the nursing notes.  Pertinent labs & imaging results that were available during my care of the patient were reviewed by me and considered in my medical decision making (see chart for details).  Clinical Course as of 12/16/20 2145  Fri Dec 16, 2020  1736 CT Head Wo Contrast [MB]    Clinical Course User Index [MB] Lyndel Safe   MDM Rules/Calculators/A&P                          65 year old female presents to the ER after syncopal episode and fall onto a toilet.  Complaining of "pain all over", pain to her chest, abdomen, radiating into the back.  She is neurovascularly and intact, no gross neuro deficits on exam.  No visible bruising or deformities noted.  She has no tenderness to palpation to the C, T, L-spine.  Moving all 4 extremities so gingerly, but with no weakness.  Vitals on arrival overall reassuring.  Afebrile, not tachycardic, hypotensive or hypoxic.  DDx includes hypoglycemia, hypotension, dissection, PE, vasovagal syncope  Labs and imaging ordered, reviewed and interpreted by me -CBC without leukocytosis, normal hemoglobin -CMP without any significant electrode abnormalities, glucose of 137, will renal and liver function tests -UA with more than 500 glucose, no evidence of UTI or blood -POC CBG 111 -Delta troponin elevated, however unchanged at 27 and 29 respectively  Chest x-ray without any acute  abnormalities.  We did order a CT chest abdomen pelvis with contrast, which shows a right seventh rib fracture, however no other acute abnormalities noted.  CT of the head also with chronic microvascular changes but no evidence of acute stroke or bleeding.  She has no neck pain, low suspicion for cervical fracture  Pain was treated here in the ER with Dilaudid.  Patient notes improvement in pain.  I discussed the reassuring work-up with the patient.  Patient was given an incentive  spirometer and educated on its use.  Will send home with short course of Norco for pain control.  I did stressed follow-up with her PCP.  Low suspicion for PE, dissection, ACS, stroke at this time.  Suspect possible hypotension given recent use of Lasix last night.  No evidence of severe dehydration here.       Will send home with Norco for pain management as per discussion with the patient.  PDMP reviewed, appropriate.    CT scan read right rib fracture.  Patient however continued to complain of left-sided rib pain.  I touch base again with radiology who reviewed the scans, actually found fourth through seventh rib fractures.  Addendum was added.  I discussed these findings with the patient.  We did discuss that given she has more fractures it would be reasonable to have her admitted for observation.  However she expressed that her pain was well under control, and she would rather get some rest at home.  She states that she had multiple rib fractures in the past and has manage them at home before.  We discussed very strict return precautions, stressed follow-up with her PCP within the week.  She voiced understanding and is agreeable.  At this stage in the ED course, the patient is medically screened and stable for discharge  This was a shared visit with my supervising physician Dr. Alvino Chapel who independently saw and evaluated the patient & provided guidance in evaluation/management/disposition ,in agreement with care  Final Clinical Impression(s) / ED Diagnoses Final diagnoses:  Abdominal pain  Fall, initial encounter  Closed fracture of one rib of right side, initial encounter    Rx / DC Orders ED Discharge Orders         Ordered    HYDROcodone-acetaminophen (NORCO) 10-325 MG tablet  Every 6 hours PRN        12/16/20 2043               Lyndel Safe 12/16/20 2148    Davonna Belling, MD 12/17/20 [phone]

## 2020-12-16 NOTE — ED Notes (Signed)
An After Visit Summary was printed and given to the patient. Discharge instructions given and no further questions at this time.  

## 2020-12-16 NOTE — Discharge Instructions (Addendum)
Your work-up today here was overall reassuring.  Your CT scan did show a right rib fracture.  Please use the incentive spirometer several times a day.  I will also send in a short course of pain medicine.  Please take this as needed for breakthrough pain.  Please make sure to follow-up with your primary care doctor.  Return to the ER for any new or worsening symptoms

## 2020-12-16 NOTE — ED Notes (Signed)
Per day shift RN, lab unable to find repeat troponin.

## 2020-12-18 ENCOUNTER — Other Ambulatory Visit: Payer: Self-pay | Admitting: Physician Assistant

## 2020-12-19 ENCOUNTER — Encounter: Payer: Self-pay | Admitting: Family Medicine

## 2020-12-19 ENCOUNTER — Other Ambulatory Visit: Payer: Self-pay

## 2020-12-19 ENCOUNTER — Ambulatory Visit (INDEPENDENT_AMBULATORY_CARE_PROVIDER_SITE_OTHER): Payer: Medicare Other | Admitting: Family Medicine

## 2020-12-19 VITALS — BP 118/79 | HR 85 | Temp 97.5°F | Ht 71.0 in | Wt 164.6 lb

## 2020-12-19 DIAGNOSIS — S2239XA Fracture of one rib, unspecified side, initial encounter for closed fracture: Secondary | ICD-10-CM

## 2020-12-19 DIAGNOSIS — W1830XA Fall on same level, unspecified, initial encounter: Secondary | ICD-10-CM | POA: Diagnosis not present

## 2020-12-19 DIAGNOSIS — E1151 Type 2 diabetes mellitus with diabetic peripheral angiopathy without gangrene: Secondary | ICD-10-CM

## 2020-12-19 DIAGNOSIS — Z794 Long term (current) use of insulin: Secondary | ICD-10-CM | POA: Diagnosis not present

## 2020-12-19 DIAGNOSIS — R55 Syncope and collapse: Secondary | ICD-10-CM

## 2020-12-19 DIAGNOSIS — R0781 Pleurodynia: Secondary | ICD-10-CM

## 2020-12-19 MED ORDER — RIB BELT/WOMENS MEDIUM MISC: 0 refills | Status: DC

## 2020-12-19 MED ORDER — OXYCODONE-ACETAMINOPHEN 10-325 MG PO TABS: 1.0000 | ORAL_TABLET | Freq: Three times a day (TID) | ORAL | 0 refills | Status: AC | PRN

## 2020-12-19 MED ORDER — LIDOCAINE 5 % EX PTCH: 1.0000 | MEDICATED_PATCH | CUTANEOUS | 0 refills | Status: DC

## 2020-12-19 NOTE — Assessment & Plan Note (Signed)
Follows with endocrinology.  Has not had any lows that would explain her symptoms.

## 2020-12-19 NOTE — Patient Instructions (Signed)
It was very nice to see you today!  I will send in Percocet and lidocaine patches for your rib pain.  Please also use the elastic bandage as tolerated.  Follow-up with your cardiologist soon.  Let me know if symptoms are worsening or not improving over the next several days.  Take care, Dr Jerline Pain  Please try these tips to maintain a healthy lifestyle:   Eat at least 3 REAL meals and 1-2 snacks per day.  Aim for no more than 5 hours between eating.  If you eat breakfast, please do so within one hour of getting up.    Each meal should contain half fruits/vegetables, one quarter protein, and one quarter carbs (no bigger than a computer mouse)   Cut down on sweet beverages. This includes juice, soda, and sweet tea.     Drink at least 1 glass of water with each meal and aim for at least 8 glasses per day   Exercise at least 150 minutes every week.

## 2020-12-19 NOTE — Telephone Encounter (Signed)
Please advise if generic Colchicine can be sent in?

## 2020-12-19 NOTE — Progress Notes (Signed)
   Savannah [name] is a 65 y.o. female who presents today for an office visit.  Assessment/Plan:  New/Acute Problems: Rib Fracture No red flags.  Norco was not adequately controlling her pain.  We will send in small supply of Percocet.  Also will prescribe lidocaine patches and rib belt per patient request.  She can continue using incentive spirometer.  Discussed reasons to return to care.   Syncope Unclear etiology.  She did have some preceding dizziness.  May have had a vasovagal episode though potentially also hypotensive episode given that she just use Lasix prior to the fall.  She will follow-up with cardiology soon.  She has a history of PVCs.  Has not had any recurrence since her fall.   Chronic Problems Addressed Today: Type 2 diabetes mellitus with diabetic peripheral angiopathy without gangrene, with long-term current use of insulin ([place]) Follows with endocrinology.  Has not had any lows that would explain her symptoms.     Subjective:  HPI:  Patient here for ED follow up.  Went to the ED 3 days after suffering a fall after a syncopal episode.  Patient states that she felt dizzy and thought that she was having low blood sugar.  She went to the bathroom and checked her blood sugar.  In the process of this, she lost consciousness fell back and hit her head on the toilet.  Ended up going to the ED.  Patient notes that her blood sugar at the time of the fall was 135.  In the ED, her chest x-ray was normal.  Ordered CT chest abdomen pelvis which showed right seventh rib fracture.  She was given Dilaudid in the ED and sent home with incentive spirometer and pain control with norco.   Since being home, her pain has persisted.  No shortness of breath.  No fevers or chills.  Norco helps but feels like she could use something stronger.  Sometimes wears off early as well.  Pain is better when she presses on the area.        Objective:  Physical Exam: BP 118/79   Pulse 85   Temp (!) 97.5 F  (36.4 C) (Temporal)   Ht 5\' 11"  (1.803 m)   Wt 164 lb 9.6 oz (74.7 kg)   SpO2 95%   BMI 22.96 kg/m   Gen: No acute distress, resting comfortably CV: Regular rate and rhythm with no murmurs appreciated Pulm: Normal work of breathing, clear to auscultation bilaterally with no crackles, wheezes, or rhonchi Neuro: Grossly normal, moves all extremities Psych: Normal affect and thought content  Time Spent: 45 minutes of total time was spent on the date of the encounter performing the following actions: chart review prior to seeing the patient including recent ED visit, obtaining history, performing a medically necessary exam, personally reviewing her imaging in the ED, counseling on the treatment plan, placing orders, and documenting in our EHR.        Algis Greenhouse. Jerline Pain, MD 12/19/2020 3:47 PM

## 2020-12-21 ENCOUNTER — Encounter: Payer: Self-pay | Admitting: Family Medicine

## 2020-12-21 ENCOUNTER — Telehealth: Payer: Self-pay

## 2020-12-21 NOTE — Telephone Encounter (Signed)
See below, pt was seen on 12/19/20.

## 2020-12-21 NOTE — Telephone Encounter (Signed)
Pt called stating she is still experiencing rib pain and is now severely constipated. Pt states she has not had a bowel movement since 2/4/. Pt is asking what she should do. Please advise.

## 2020-12-21 NOTE — Telephone Encounter (Signed)
Please advise 

## 2020-12-22 NOTE — Telephone Encounter (Signed)
Called and spoke with pt and she states she tried miralax once this morning and it helped her so she will continue to take it.

## 2020-12-22 NOTE — Telephone Encounter (Signed)
She should try taking OTC miralax. IF she is already doing that then we can send in rx constipation medication.  Algis Greenhouse. Jerline Pain, MD 12/22/2020 8:32 AM

## 2020-12-22 NOTE — Telephone Encounter (Signed)
Called and spoke with pt and pt aware.

## 2020-12-23 ENCOUNTER — Ambulatory Visit (INDEPENDENT_AMBULATORY_CARE_PROVIDER_SITE_OTHER): Payer: Medicare Other | Admitting: Internal Medicine

## 2020-12-23 ENCOUNTER — Other Ambulatory Visit: Payer: Self-pay

## 2020-12-23 ENCOUNTER — Encounter: Payer: Self-pay | Admitting: Internal Medicine

## 2020-12-23 VITALS — BP 94/63 | HR 85 | Ht 71.0 in | Wt 162.8 lb

## 2020-12-23 DIAGNOSIS — I2510 Atherosclerotic heart disease of native coronary artery without angina pectoris: Secondary | ICD-10-CM

## 2020-12-23 DIAGNOSIS — E1140 Type 2 diabetes mellitus with diabetic neuropathy, unspecified: Secondary | ICD-10-CM

## 2020-12-23 DIAGNOSIS — I5032 Chronic diastolic (congestive) heart failure: Secondary | ICD-10-CM | POA: Diagnosis not present

## 2020-12-23 DIAGNOSIS — R55 Syncope and collapse: Secondary | ICD-10-CM

## 2020-12-23 NOTE — Patient Instructions (Signed)
Medication Instructions:  No Changes In Medications at this time.  *If you need a refill on your cardiac medications before your next appointment, please call your pharmacy*  Follow-Up: At [facility], you and your health needs are our priority.  As part of our continuing mission to provide you with exceptional heart care, we have created designated Provider Care Teams.  These Care Teams include your primary Cardiologist (physician) and Advanced Practice Providers (APPs -  Physician Assistants and Nurse Practitioners) who all work together to provide you with the care you need, when you need it.  Your next appointment:   2 month(s)  The format for your next appointment:   In Person  Provider:   Cherlynn Kaiser, MD  Other Instructions COMPRESSION STOCKINGS- PLEASE SEE INFORMATION [place].

## 2020-12-23 NOTE — Progress Notes (Signed)
Cardiology Office Note:    Date:  12/23/2020   ID:  Savannah [name], DOB 01/02/1956, MRN [id number]  PCP:  Vivi Barrack, MD  Cardiologist:  Elouise Munroe, MD  Electrophysiologist:  None   Referring MD: Vivi Barrack, MD   Chief Complaint/Reason for Referral: CAD  History of Present Illness:    Savannah [name] is a 65 y.o. female with a history of significant three-vessel coronary artery disease with no severe lesions involving distal small vascular territories.  She has had associated chest pain which we are medically managing.  History also includes PVCs, esophageal rings with multiple previous dilations, bariatric surgery in 2011 with appropriate postoperative weight loss.   [place] last Friday and broke 4 ribs. [place] and does not remember - likely orthostatic syncope. Weird dizzy. Not like she feels with low blood sugar. No preceding CP or pressure.  She had a syncopal episode.  Blood sugar was 135 at that time.  Evaluated in the ER and recommended for cardiology follow-up.  Evaluation in the emergency department was unremarkable for a cause for syncope.  Her blood pressure today is 94/63 and we have discussed likely orthostatic hypotension as the cause of her syncope.  She is no longer on any blood pressure lowering agents.  She did take 20 mg of Lasix the night before her ED presentation due to swollen ankles.  She did not hydrate after and I suspect she was volume deplete as a source of her syncope.  She also has severe basal septal hypertrophy on her echocardiogram from 2019.  We have not had a reason to repeat her echo since then, and we discussed repeating an echo when her rib fractures heal.   Past Medical History:  Diagnosis Date  . ABDOMINAL PAIN, CHRONIC 10/20/2009  . Acute cystitis 08/17/2009  . ALLERGIC RHINITIS 11/21/2007  . Allergy   . ASYMPTOMATIC POSTMENOPAUSAL STATUS 09/29/2008  . Bariatric surgery status 07/07/2010  . Bariatric surgery status 07/07/2010   Qualifier:  Diagnosis of  By: Loanne Drilling MD, Jacelyn Pi   . Cancer ([place])    uterine  . Cataract   . Coronary artery disease   . DEPRESSION 11/21/2007  . DIABETES MELLITUS, TYPE II 07/08/2007  . DYSPNEA 05/20/2009  . Edema 05/20/2009  . Eosinophilic esophagitis 07/15/4267  . FEVER UNSPECIFIED 10/20/2009  . FEVER, HX OF 03/09/2010  . GERD (gastroesophageal reflux disease)   . Headache(784.0) 02/06/2010  . Hepatomegaly 01/06/2008  . HYPERLIPIDEMIA 07/08/2007  . HYPERTENSION 07/08/2007  . LEUKOPENIA, MILD 09/29/2008  . OBESITY 01/06/2008  . OSTEOARTHRITIS 01/06/2008  . Other chronic nonalcoholic liver disease 3/41/9622  . PERIPHERAL NEUROPATHY 11/21/2007  . Peripheral neuropathy   . Postsurgical hypothyroidism 09/19/2010  . SINUSITIS- ACUTE-NOS 11/21/2007  . TB SKIN TEST, POSITIVE 02/06/2010  . THYROID NODULE 03/09/2010    Past Surgical History:  Procedure Laterality Date  . ABDOMINAL HYSTERECTOMY    . BASAL CELL CARCINOMA EXCISION    . CARDIAC CATHETERIZATION    . COLONOSCOPY    . EYE SURGERY Bilateral 08/2018, 09/2018  . FOOT SURGERY Left    10/2019  . KNEE ARTHROSCOPY Left   . LEFT HEART CATH AND CORONARY ANGIOGRAPHY N/A 01/20/2019   Procedure: LEFT HEART CATH AND CORONARY ANGIOGRAPHY;  Surgeon: Belva Crome, MD;  Location: [facility];  Service: Cardiovascular;  Laterality: N/A;  . LYMPH NODE DISSECTION    . OOPHORECTOMY    . Right total knee replacement    . ROUX-EN-Y GASTRIC  BYPASS  2011  . SKIN TAG REMOVAL    . THYROIDECTOMY    . TONSILLECTOMY AND ADENOIDECTOMY    . UPPER GASTROINTESTINAL ENDOSCOPY      Current Medications: Current Meds  Medication Sig  . 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  Correct leg length and improve pronation BL  . amoxicillin (AMOXIL) 500 MG capsule   . aspirin EC 81 MG tablet Take 1 tablet (81 mg total) by mouth daily. Starting 01/18/19 prior to cardiac cath  . atorvastatin (LIPITOR) 40 MG tablet Take 40 mg by mouth every  evening.  . canagliflozin (INVOKANA) 300 MG TABS tablet Take 1 tablet (300 mg total) by mouth daily before breakfast.  . Cholecalciferol (VITAMIN D) 50 MCG (2000 UT) tablet Take 4,000 Units by mouth daily.  . Colchicine 0.6 MG CAPS TAKE 1 CAPSULE BY MOUTH EVERY DAY  . cyclobenzaprine (FLEXERIL) 10 MG tablet Take 10 mg by mouth at bedtime as needed for muscle spasms.  [name] desloratadine (CLARINEX) 5 MG tablet Take 5 mg by mouth daily.  . diclofenac sodium (VOLTAREN) 1 % GEL Apply 4 g topically 4 (four) times daily. (Patient taking differently: Apply 4 g topically as needed (pain).)  . docusate sodium (COLACE) 100 MG capsule Take 200 mg by mouth 2 (two) times daily.  . Dulaglutide (TRULICITY) 3 WN/0.2VO SOPN Inject 3 mg as directed once a week.  . Elastic Bandages & Supports (RIB BELT/WOMENS MEDIUM) MISC Use as needed for rib pain  . Fluconazole (DIFLUCAN PO) Take by mouth as needed.  . furosemide (LASIX) 20 MG tablet Take 20 mg by mouth daily as needed for fluid (swelling).  . gabapentin (NEURONTIN) 300 MG capsule TAKE 3 CAPSULES (900 MG TOTAL) BY MOUTH 3 (THREE) TIMES DAILY.  . halobetasol (ULTRAVATE) 0.05 % cream Apply 1 application topically 2 (two) times daily as needed (lichen sclerosus).   [name] levothyroxine (SYNTHROID) 112 MCG tablet TAKE 1 TABLET BY MOUTH EVERY DAY  . lidocaine (LIDODERM) 5 % Place 1 patch onto the skin daily. Remove & Discard patch within 12 hours or as directed by MD  . MAGNESIUM PO Take 1 tablet by mouth daily as needed (cramps).   . metFORMIN (GLUCOPHAGE-XR) 500 MG 24 hr tablet Take 4 tablets (2,000 mg total) by mouth daily. (Patient taking differently: Take 1,000 mg by mouth in the morning and at bedtime.)  . Multiple Vitamin (MULTIVITAMIN) tablet Take 1 tablet by mouth in the morning and at bedtime.  . nitroGLYCERIN (NITROSTAT) 0.4 MG SL tablet Place 1 tablet (0.4 mg total) under the tongue every 5 (five) minutes as needed.  Glory Rosebush ULTRA test strip USE TO MONITOR  GLUCOSE LEVELS ONCE PER DAY E11.40  . oxyCODONE-acetaminophen (PERCOCET) 10-325 MG tablet Take 1 tablet by mouth every 8 (eight) hours as needed for up to 5 days for pain.  . pantoprazole (PROTONIX) 40 MG tablet TAKE 1 TABLET BY MOUTH TWICE A DAY  . pantoprazole (PROTONIX) 40 MG tablet Take 1 tablet (40 mg total) by mouth 2 (two) times daily.  . phenazopyridine (PYRIDIUM) 100 MG tablet Take 100 mg by mouth 3 (three) times daily as needed for pain.  . Probiotic CAPS Take 1 capsule by mouth daily.  . repaglinide (PRANDIN) 2 MG tablet Take 1 tablet (2 mg total) by mouth 2 (two) times daily before a meal.  . traMADol (ULTRAM) 50 MG tablet Take 50 mg by mouth 2 (two) times daily as needed for moderate pain.  Allergies:   Ace inhibitors, Caffeine, Ciprofloxacin, Morphine and related, Mucinex [guaifenesin er], Nsaids, Other, and Silicone   Social History   Tobacco Use  . Smoking status: Former Smoker    Packs/day: 1.50    Years: 15.00    Pack years: 22.50    Types: Cigarettes    Quit date: 1992    Years since quitting: 30.1  . Smokeless tobacco: Never Used  Vaping Use  . Vaping Use: Never used  Substance Use Topics  . Alcohol use: Yes    Comment: rarely  . Drug use: Never     Family History: The patient's family history includes Breast cancer in her maternal aunt and sister; Colon cancer in her maternal uncle; Congestive Heart Failure in her father; Diabetes in her father, maternal aunt, maternal grandmother, maternal uncle, mother, and paternal grandmother; Hypertension in her father; Liver cancer in her maternal aunt; Multiple sclerosis in her sister; Myelodysplastic syndrome in her father; Osteoporosis in her sister.  ROS:   Please see the history of present illness.    All other systems reviewed and are negative.  EKGs/Labs/Other Studies Reviewed:    The following studies were reviewed today:  EKG: Normal sinus rhythm  I have independently reviewed the images from chest  x-ray 12/16/2020.  Recent Labs: 01/14/2020: TSH 0.92 12/16/2020: ALT 51; BUN 25; Creatinine, Ser 0.97; Hemoglobin 15.1; Platelets 146; Potassium 4.1; Sodium 140  Recent Lipid Panel    Component Value Date/Time   CHOL 136 04/29/2019 1336   TRIG 102 04/29/2019 1336   HDL 47 04/29/2019 1336   CHOLHDL 2.9 04/29/2019 1336   CHOLHDL 4 07/04/2018 1403   VLDL 24.6 07/04/2018 1403   LDLCALC 69 04/29/2019 1336   LDLDIRECT 99.0 10/12/2011 1219    Physical Exam:    VS:  BP 94/63   Pulse 85   Ht 5' 11"  (1.803 m)   Wt 162 lb 12.8 oz (73.8 kg)   SpO2 98%   BMI 22.71 kg/m     Wt Readings from Last 5 Encounters:  12/23/20 162 lb 12.8 oz (73.8 kg)  12/19/20 164 lb 9.6 oz (74.7 kg)  12/06/20 172 lb 9.6 oz (78.3 kg)  11/09/20 164 lb 9.6 oz (74.7 kg)  11/08/20 162 lb (73.5 kg)    Constitutional: No acute distress Eyes: sclera non-icteric, normal conjunctiva and lids ENMT: normal dentition, moist mucous membranes Cardiovascular: regular rhythm, normal rate, no murmurs. S1 and S2 normal. Radial pulses normal bilaterally. No jugular venous distention.  Respiratory: clear to auscultation bilaterally GI : normal bowel sounds, soft and nontender. No distention.   MSK: extremities warm, well perfused. No edema.  NEURO: grossly nonfocal exam, moves all extremities. PSYCH: alert and oriented x 3, normal mood and affect.   ASSESSMENT:    1. Syncope and collapse   2. Coronary artery disease involving native coronary artery of native heart without angina pectoris   3. Chronic diastolic CHF (congestive heart failure) ([place])   4. Type 2 diabetes mellitus with diabetic neuropathy, unspecified whether long term insulin use ([facility])    PLAN:    She experienced a syncopal episode likely secondary to hypovolemia and orthostatic hypotension.  She took Lasix the night before to get rid of leg swelling.  She does not have shortness of breath with her lower extremity swelling.  I suspect this is likely due to venous  insufficiency.  I think to better manage her lower extremity swelling without the use of pharmacologic therapy compression stockings are a must.  I have written her prescription for fitted compression stockings today and recommend she start using them immediately.  If she takes Lasix she is to get up slowly afterward and ensure she can maintain her balance.  I would like to repeat an echocardiogram due to severe LVH on echo from 2019.  I would like to rule out dynamic LVOT obstruction, cardiomyopathies, and evaluate for hyperdynamic ventricle which all may be contributing.  Unfortunately she has rib fractures on the left which will prevent [place] from doing an echo at this time.  We will consider doing an echo in 2 to 3 months when she comes back for follow-up and her rib fractures have healed.     Total time of encounter: 30 minutes total time of encounter, including 25 minutes spent in face-to-face patient care on the date of this encounter. This time includes coordination of care and counseling regarding above mentioned problem list. Remainder of non-face-to-face time involved reviewing chart documents/testing relevant to the patient encounter and documentation in the medical record. I have independently reviewed documentation from referring provider.   Cherlynn Kaiser, MD   [facility]    Medication Adjustments/Labs and Tests Ordered: Current medicines are reviewed at length with the patient today.  Concerns regarding medicines are outlined above.   Orders Placed This Encounter  Procedures  . Compression stockings  . EKG 12-Lead    No orders of the defined types were placed in this encounter.   Patient Instructions  Medication Instructions:  No Changes In Medications at this time.  *If you need a refill on your cardiac medications before your next appointment, please call your pharmacy*  Follow-Up: At [facility], you and your health needs are our priority.  As part of  our continuing mission to provide you with exceptional heart care, we have created designated Provider Care Teams.  These Care Teams include your primary Cardiologist (physician) and Advanced Practice Providers (APPs -  Physician Assistants and Nurse Practitioners) who all work together to provide you with the care you need, when you need it.  Your next appointment:   2 month(s)  The format for your next appointment:   In Person  Provider:   Cherlynn Kaiser, MD  Other Instructions COMPRESSION STOCKINGS- PLEASE SEE INFORMATION [place].

## 2020-12-28 ENCOUNTER — Other Ambulatory Visit: Payer: Self-pay | Admitting: *Deleted

## 2020-12-28 DIAGNOSIS — R899 Unspecified abnormal finding in specimens from other organs, systems and tissues: Secondary | ICD-10-CM

## 2020-12-28 NOTE — Telephone Encounter (Signed)
Please schedule [place] and bone density test

## 2020-12-28 NOTE — Telephone Encounter (Signed)
Referral placed.

## 2020-12-28 NOTE — Telephone Encounter (Signed)
Sent referral to [facility]

## 2020-12-30 NOTE — Telephone Encounter (Signed)
Referral placed.

## 2021-01-02 ENCOUNTER — Telehealth (INDEPENDENT_AMBULATORY_CARE_PROVIDER_SITE_OTHER): Payer: Medicare Other | Admitting: Family Medicine

## 2021-01-02 VITALS — Ht 71.0 in | Wt 153.0 lb

## 2021-01-02 DIAGNOSIS — R0781 Pleurodynia: Secondary | ICD-10-CM | POA: Diagnosis not present

## 2021-01-02 DIAGNOSIS — K8020 Calculus of gallbladder without cholecystitis without obstruction: Secondary | ICD-10-CM

## 2021-01-02 MED ORDER — OXYCODONE-ACETAMINOPHEN 10-325 MG PO TABS: 0.5000 | ORAL_TABLET | Freq: Three times a day (TID) | ORAL | 0 refills | Status: AC | PRN

## 2021-01-02 NOTE — Assessment & Plan Note (Signed)
Incidentally found on CT scan earlier this month.  We will continue with watchful waiting.  She is currently asymptomatic.  Discussed reasons to return to care.

## 2021-01-02 NOTE — Progress Notes (Signed)
   Savannah [name] is a 65 y.o. female who presents today for a virtual office visit.  Assessment/Plan:  New/Acute Problems: Rib [name] Still within expected recovery when due.  Will refill her oxycodone today.  Database without any red flags.  Encourage good deep inspirations.  Chronic Problems Addressed Today: Cholelithiasis Incidentally found on CT scan earlier this month.  We will continue with watchful waiting.  She is currently asymptomatic.  Discussed reasons to return to care.     Subjective:  HPI:  Patient here for rib [name] follow-up.  She is doing well though still has some issues with [name].  Oxycodone works well.  She would like refill.  She has tried not using medication however [name] becomes unbearable.  Overall symptoms seem to be improving.       Objective/Observations  Physical Exam: Gen: NAD, resting comfortably Pulm: Normal work of breathing Neuro: Grossly normal, moves all extremities Psych: Normal affect and thought content  Virtual Visit via Video   I connected with Savannah [name] on 01/02/21 at  3:40 PM EST by a video enabled telemedicine application and verified that I am speaking with the correct person using two identifiers. The limitations of evaluation and management by telemedicine and the availability of in person appointments were discussed. The patient expressed understanding and agreed to proceed.   Patient location: Home Provider location: [place] participating in the virtual visit: Myself and Patient     Savannah [name]. Savannah Pain, MD 01/02/2021 3:38 PM

## 2021-01-11 ENCOUNTER — Other Ambulatory Visit: Payer: Self-pay

## 2021-01-11 ENCOUNTER — Ambulatory Visit (INDEPENDENT_AMBULATORY_CARE_PROVIDER_SITE_OTHER)
Admission: RE | Admit: 2021-01-11 | Discharge: 2021-01-11 | Disposition: A | Discharge status: Home / Self Care | Payer: Medicare Other | Source: Ambulatory Visit | Attending: Family Medicine | Admitting: Family Medicine

## 2021-01-11 DIAGNOSIS — Z780 Asymptomatic menopausal state: Secondary | ICD-10-CM | POA: Diagnosis not present

## 2021-01-11 DIAGNOSIS — C55 Malignant neoplasm of uterus, part unspecified: Secondary | ICD-10-CM

## 2021-01-14 ENCOUNTER — Encounter: Payer: Self-pay | Admitting: Family Medicine

## 2021-01-14 DIAGNOSIS — M8580 Other specified disorders of bone density and structure, unspecified site: Secondary | ICD-10-CM | POA: Insufficient documentation

## 2021-01-14 NOTE — Progress Notes (Signed)
Please inform patient of the following:  She has osteopenia but is very close to being osteoporotic.  Do not need to start medications at this point however she should make sure that she is getting plenty of calcium and vitamin D and start supplementing if necessary.  We should repeat bone density scan in 2 years.

## 2021-01-16 ENCOUNTER — Ambulatory Visit
Admission: RE | Admit: 2021-01-16 | Discharge: 2021-01-16 | Disposition: A | Discharge status: Home / Self Care | Payer: Medicare Other | Source: Ambulatory Visit | Attending: Family Medicine | Admitting: Family Medicine

## 2021-01-16 DIAGNOSIS — K8020 Calculus of gallbladder without cholecystitis without obstruction: Secondary | ICD-10-CM | POA: Diagnosis not present

## 2021-01-16 DIAGNOSIS — R899 Unspecified abnormal finding in specimens from other organs, systems and tissues: Secondary | ICD-10-CM

## 2021-01-16 DIAGNOSIS — R945 Abnormal results of liver function studies: Secondary | ICD-10-CM | POA: Diagnosis not present

## 2021-01-17 ENCOUNTER — Encounter: Payer: Self-pay | Admitting: Family Medicine

## 2021-01-17 NOTE — Progress Notes (Signed)
Please inform patient of the following:  She has gallstones. But otherwise her ultrasound was normal.  We were concerned about possible gall stones a couple of years ago but her ultrasound was negative then. We can place a referral for her to see a surgeon to discuss removal or she can also follow up with her gastroenterologist to discuss further if she wishes.  Algis Greenhouse. Jerline Pain, MD 01/17/2021 2:07 PM

## 2021-01-18 ENCOUNTER — Other Ambulatory Visit: Payer: Self-pay | Admitting: *Deleted

## 2021-01-18 MED ORDER — GABAPENTIN 300 MG PO CAPS: 900.0000 mg | ORAL_CAPSULE | Freq: Three times a day (TID) | ORAL | 2 refills | Status: DC

## 2021-01-25 ENCOUNTER — Encounter: Payer: Self-pay | Admitting: Family Medicine

## 2021-01-26 ENCOUNTER — Other Ambulatory Visit: Payer: Self-pay | Admitting: *Deleted

## 2021-01-26 MED ORDER — GABAPENTIN 600 MG PO TABS: 1200.0000 mg | ORAL_TABLET | Freq: Three times a day (TID) | ORAL | 1 refills | Status: DC

## 2021-01-26 NOTE — Telephone Encounter (Signed)
See note

## 2021-01-30 DIAGNOSIS — H04123 Dry eye syndrome of bilateral lacrimal glands: Secondary | ICD-10-CM | POA: Diagnosis not present

## 2021-01-30 DIAGNOSIS — Z961 Presence of intraocular lens: Secondary | ICD-10-CM | POA: Diagnosis not present

## 2021-01-30 DIAGNOSIS — D3132 Benign neoplasm of left choroid: Secondary | ICD-10-CM | POA: Diagnosis not present

## 2021-01-30 DIAGNOSIS — E119 Type 2 diabetes mellitus without complications: Secondary | ICD-10-CM | POA: Diagnosis not present

## 2021-01-30 DIAGNOSIS — H353131 Nonexudative age-related macular degeneration, bilateral, early dry stage: Secondary | ICD-10-CM | POA: Diagnosis not present

## 2021-01-30 NOTE — Progress Notes (Signed)
Office Visit Note  Patient: Savannah [name]             Date of Birth: 05-May-1956           MRN: [id number]             PCP: Savannah Barrack, MD Referring: Savannah Barrack, MD Visit Date: 02/13/2021 Occupation: @GUAROCC @  Subjective:  Pain in multiple joints   History of Present Illness: Savannah [name] is a 65 y.o. female with history of inflammatory arthritis and osteoarthritis. She is taking colchicine 0.6 mg 1 capsule by mouth daily. She continues to tolerate colchicine without any side effects.  She continues to have pain and stiffness in multiple joints including both hands, wrist joints, ankles, and feet.  She has chronic pain in the right knee which was replaced by Dr. Veverly [name].  She denies any instability or buckling of the right knee replacement.  According to the patient she had a x-ray of the right knee ordered by Dr. Erlinda [name] on 12/08/20 which was unremarkable.  She uses voltaren gel topically as needed for pain relief.  She has been taking tramadol very sparingly.  She does not like having to take narcotics and typically experiences worsening constipation as a side effects.  She continues to have chronic neck and lower back pain.  She was receiving injections by her neurologist in [place] but she requested a referral to a new neurologist today.  She remains on gabapentin 1200 mg daily for management of neuropathy.  She had a recent NCV with EMG revealing CTS bilaterally, R>L.   She had a DEXA performed on 01/11/21 and plans on further discussing the results with Dr. Loanne [name] at her upcoming appointment.        Activities of Daily Living:  Patient reports morning stiffness for all day. Patient Denies nocturnal pain.  Difficulty dressing/grooming: Reports Difficulty climbing stairs: Reports Difficulty getting out of chair: Reports Difficulty using hands for taps, buttons, cutlery, and/or writing: Reports  Review of Systems  Constitutional: Positive for fatigue.  HENT: Positive for  mouth dryness. Negative for mouth sores and nose dryness.   Eyes: Positive for dryness. Negative for pain and itching.  Respiratory: Negative for shortness of breath and difficulty breathing.   Cardiovascular: Negative for chest pain and palpitations.  Gastrointestinal: Negative for blood in stool, constipation and diarrhea.  Endocrine: Negative for increased urination.  Genitourinary: Negative for difficulty urinating.  Musculoskeletal: Positive for arthralgias, joint pain, joint swelling and morning stiffness. Negative for myalgias, muscle tenderness and myalgias.  Skin: Positive for color change and rash. Negative for redness.  Allergic/Immunologic: Negative for susceptible to infections.  Neurological: Positive for dizziness and numbness. Negative for headaches, memory loss and weakness.  Hematological: Positive for bruising/bleeding tendency.  Psychiatric/Behavioral: Negative for confusion.    PMFS History:  Patient Active Problem List   Diagnosis Date Noted  . Osteopenia 01/14/2021  . Cholelithiasis 01/02/2021  . Sesamoiditis of left foot 11/09/2020  . Posterior tibial tendinitis of left lower extremity 11/09/2020  . Type 2 diabetes mellitus with diabetic peripheral angiopathy without gangrene, with long-term current use of insulin ([place]) 10/21/2020  . Iron deficiency anemia 03/14/2020  . Deficiency anemia 02/29/2020  . Lesion of vulva 09/02/2019  . Menorrhagia 09/02/2019  . Neuropathy 09/02/2019  . Abdominal pain, epigastric 06/25/2019  . Irregular bowel habits 06/25/2019  . Coronary artery disease involving native coronary artery of native heart without angina pectoris   . Temporomandibular joint disorder 12/05/2018  .  Fatigue 12/05/2018  . Inverted nipple 12/05/2018  . Senile purpura ([place]) 11/21/2018  . GERD with esophagitis 11/21/2018  . Pseudogout involving multiple joints 11/21/2018  . Lipoma 07/22/2017  . Lichen sclerosus 73/53/2992  . H/O gastric bypass 11/16/2016   . GERD (gastroesophageal reflux disease) 11/16/2016  . Depression 11/16/2016  . Diabetic polyneuropathy associated with type 2 diabetes mellitus ([place]) 11/16/2016  . Hyperlipidemia 11/16/2016  . Left lower quadrant pain 11/16/2016  . Uterine cancer [facility]) s/p hysterectomy 1997 01/04/2012  . Postsurgical hypothyroidism 09/19/2010  . Generalized abdominal pain 10/20/2009  . Diabetic neuropathy ([place]) 11/21/2007  . Allergic rhinitis 11/21/2007  . Type 2 diabetes mellitus with diabetic neuropathy, unspecified ([place]) 07/08/2007  . Dyslipidemia associated with type 2 diabetes mellitus ([place]) 07/08/2007    Past Medical History:  Diagnosis Date  . ABDOMINAL PAIN, CHRONIC 10/20/2009  . Acute cystitis 08/17/2009  . ALLERGIC RHINITIS 11/21/2007  . Allergy   . ASYMPTOMATIC POSTMENOPAUSAL STATUS 09/29/2008  . Bariatric surgery status 07/07/2010  . Bariatric surgery status 07/07/2010   Qualifier: Diagnosis of  By: Savannah Drilling MD, Savannah [name]   . Cancer ([place])    uterine  . Cataract   . Coronary artery disease   . DEPRESSION 11/21/2007  . DIABETES MELLITUS, TYPE II 07/08/2007  . DYSPNEA 05/20/2009  . Edema 05/20/2009  . Eosinophilic esophagitis 02/11/6833  . FEVER UNSPECIFIED 10/20/2009  . FEVER, HX OF 03/09/2010  . GERD (gastroesophageal reflux disease)   . Headache(784.0) 02/06/2010  . Hepatomegaly 01/06/2008  . HYPERLIPIDEMIA 07/08/2007  . HYPERTENSION 07/08/2007  . LEUKOPENIA, MILD 09/29/2008  . OBESITY 01/06/2008  . OSTEOARTHRITIS 01/06/2008  . Other chronic nonalcoholic liver disease 1/96/2229  . PERIPHERAL NEUROPATHY 11/21/2007  . Peripheral neuropathy   . Postsurgical hypothyroidism 09/19/2010  . SINUSITIS- ACUTE-NOS 11/21/2007  . TB SKIN TEST, POSITIVE 02/06/2010  . THYROID NODULE 03/09/2010    Family History  Problem Relation Age of Onset  . Multiple sclerosis Sister   . Breast cancer Sister        breast onset age 25  . Osteoporosis Sister   . Diabetes Mother   . Diabetes Father   . Congestive Heart  Failure Father   . Hypertension Father   . Myelodysplastic syndrome Father   . Liver cancer Maternal Aunt   . Breast cancer Maternal Aunt   . Diabetes Maternal Aunt   . Colon cancer Maternal Uncle   . Diabetes Maternal Uncle   . Diabetes Maternal Grandmother   . Diabetes Paternal Grandmother    Past Surgical History:  Procedure Laterality Date  . ABDOMINAL HYSTERECTOMY    . BASAL CELL CARCINOMA EXCISION    . CARDIAC CATHETERIZATION    . COLONOSCOPY    . EYE SURGERY Bilateral 08/2018, 09/2018  . FOOT SURGERY Left    10/2019  . KNEE ARTHROSCOPY Left   . LEFT HEART CATH AND CORONARY ANGIOGRAPHY N/A 01/20/2019   Procedure: LEFT HEART CATH AND CORONARY ANGIOGRAPHY;  Surgeon: Belva Crome, MD;  Location: [facility];  Service: Cardiovascular;  Laterality: N/A;  . LYMPH NODE DISSECTION    . OOPHORECTOMY    . Right total knee replacement    . ROUX-EN-Y GASTRIC BYPASS  2011  . SKIN TAG REMOVAL    . THYROIDECTOMY    . TONSILLECTOMY AND ADENOIDECTOMY    . UPPER GASTROINTESTINAL ENDOSCOPY     Social History   Social History Narrative  . Not on file   Immunization History  Administered Date(s) Administered  .  Fluad Quad(high Dose 65+) 07/13/2019  . Influenza Inj Mdck Quad Pf 10/19/2017, 07/23/2018  . Influenza Whole 07/30/2008, 08/17/2009, 09/19/2010  . Influenza,inj,Quad PF,6-35 Mos 07/14/2015  . Influenza-Unspecified 10/13/2011, 08/12/2013, 07/13/2017, 07/23/2018  . PFIZER(Purple Top)SARS-COV-2 Vaccination 12/21/2019, 01/14/2020, 08/18/2020  . Pneumococcal Conjugate-13 10/14/2015  . Pneumococcal Polysaccharide-23 06/25/2014, 10/21/2020  . Tdap 05/20/2020  . Zoster 06/25/2014  . Zoster Recombinat (Shingrix) 05/20/2020     Objective: Vital Signs: BP 103/71 (BP Location: Left Arm, Patient Position: Sitting, Cuff Size: Normal)   Pulse 72   Resp 14   Ht 5' 11"  (1.803 m)   Wt 167 lb 12.8 oz (76.1 kg)   BMI 23.40 kg/m    Physical Exam Vitals and nursing note  reviewed.  Constitutional:      Appearance: She is well-developed.  HENT:     Head: Normocephalic and atraumatic.  Eyes:     Conjunctiva/sclera: Conjunctivae normal.  Pulmonary:     Effort: Pulmonary effort is normal.  Abdominal:     Palpations: Abdomen is soft.  Musculoskeletal:     Cervical back: Normal range of motion.  Skin:    General: Skin is warm and dry.     Capillary Refill: Capillary refill takes less than 2 seconds.  Neurological:     Mental Status: She is alert and oriented to person, place, and time.  Psychiatric:        Behavior: Behavior normal.      Musculoskeletal Exam: C-spine limited ROM especially with lateral rotation to the left.  Thoracic and lumbar spine good ROM.  No midline spinal tenderness or SI joint tenderness to palpation.  Shoulder joints and elbow joints good ROM.  Wrist joints have limited ROM with tenderness bilaterally . PIP and DIP thickening consistent with osteoarthritis of both hands.  Hip joints good ROM with discomfort in both hips.  Right knee replacement has slightly limited extension and warmth.  Left knee has good ROM with no warmth or effusion.  Tenderness over both ankle joints.  Pedal edema noted bilaterally.   CDAI Exam: CDAI Score: -- Patient Global: --; Provider Global: -- Swollen: --; Tender: -- Joint Exam 02/13/2021   No joint exam has been documented for this visit   There is currently no information documented on the homunculus. Go to the Rheumatology activity and complete the homunculus joint exam.  Investigation: No additional findings.  Imaging: US ABDOMEN RUQ W/ELASTOGRAPHY  Result Date: 01/16/2021 CLINICAL DATA:  Elevated liver function tests. EXAM: US ABDOMEN LIMITED - RIGHT UPPER QUADRANT ULTRASOUND HEPATIC ELASTOGRAPHY TECHNIQUE: Sonography of the right upper quadrant was performed. In addition, ultrasound elastography evaluation of the liver was performed. A region of interest was placed within the right lobe of  the liver. Following application of a compressive sonographic pulse, tissue compressibility was assessed. Multiple assessments were performed at the selected site. Median tissue compressibility was determined. Previously, hepatic stiffness was assessed by shear wave velocity. Based on recently published Society of Radiologists in Ultrasound consensus article, reporting is now recommended to be performed in the SI units of pressure (kiloPascals) representing hepatic stiffness/elasticity. The obtained result is compared to the published reference standards. (cACLD = compensated Advanced Chronic Liver Disease) COMPARISON:  07/09/2019 CT abdomen/pelvis FINDINGS: ULTRASOUND ABDOMEN LIMITED RIGHT UPPER QUADRANT Gallbladder: Layering mobile sludge and clustered subcentimeter shadowing gallstones in the gallbladder. No definite gallbladder wall thickening. No sonographic Murphy's sign. No pericholecystic fluid. Common bile duct: Diameter: 7 mm, unchanged from 05/27/2019 sonogram using similar measurement technique. No choledocholithiasis demonstrated, noting nonvisualization  of the distal CBD due to overlying bowel gas. Liver: No focal lesion identified. Within normal limits in parenchymal echogenicity. Portal vein is patent on color Doppler imaging with normal direction of blood flow towards the liver. ULTRASOUND HEPATIC ELASTOGRAPHY Device: Siemens Helix VTQ Patient position: Oblique Transducer 6C1 Number of measurements: 10 Hepatic segment:  8 Median kPa: 4.8 IQR: 1.2 IQR/Median kPa ratio: 0.3 Data quality: IQR/Median kPa ratio of 0.3 or greater indicates reduced accuracy Diagnostic category:  < or = 5 kPa: high probability of being normal The use of hepatic elastography is applicable to patients with viral hepatitis and non-alcoholic fatty liver disease. At this time, there is insufficient data for the referenced cut-off values and use in other causes of liver disease, including alcoholic liver disease. Patients,  however, may be assessed by elastography and serve as their own reference standard/baseline. In patients with non-alcoholic liver disease, the values suggesting compensated advanced chronic liver disease (cACLD) may be lower, and patients may need additional testing with elasticity results of 7-9 kPa. Please note that abnormal hepatic elasticity and shear wave velocities may also be identified in clinical settings other than with hepatic fibrosis, such as: acute hepatitis, elevated right heart and central venous pressures including use of beta blockers, veno-occlusive disease (Budd-Chiari), infiltrative processes such as mastocytosis/amyloidosis/infiltrative tumor/lymphoma, extrahepatic cholestasis, with hyperemia in the post-prandial state, and with liver transplantation. Correlation with patient history, laboratory data, and clinical condition recommended. Diagnostic Categories: < or =5 kPa: high probability of being normal < or =9 kPa: in the absence of other known clinical signs, rules out cACLD >9 kPa and ?13 kPa: suggestive of cACLD, but needs further testing >13 kPa: highly suggestive of cACLD > or =17 kPa: highly suggestive of cACLD with an increased probability of clinically significant portal hypertension IMPRESSION: ULTRASOUND RUQ: 1. Cholelithiasis.  No evidence of acute cholecystitis. 2. Chronic mild common bile duct dilation (7 mm diameter), unchanged since 05/27/2019 sonogram. If there is clinical concern for choledocholithiasis, MRI abdomen with MRCP without and with IV contrast may be obtained for further evaluation. 3. Normal liver. ULTRASOUND HEPATIC ELASTOGRAPHY: Median kPa:  4.8 Diagnostic category:  < or = 5 kPa: high probability of being normal Data quality: IQR/Median kPa ratio of 0.3 indicates reduced accuracy Electronically Signed   By: Ilona Sorrel M.D.   On: 01/16/2021 12:54    Recent Labs: Lab Results  Component Value Date   WBC 2.8 (L) 01/31/2021   HGB 12.2 01/31/2021   PLT 168  01/31/2021   NA 140 12/16/2020   K 4.1 12/16/2020   CL 104 12/16/2020   CO2 22 12/16/2020   GLUCOSE 137 (H) 12/16/2020   BUN 25 (H) 12/16/2020   CREATININE 0.97 12/16/2020   BILITOT 0.8 12/16/2020   ALKPHOS 98 12/16/2020   AST 32 12/16/2020   ALT 51 (H) 12/16/2020   PROT 7.6 12/16/2020   ALBUMIN 4.7 12/16/2020   CALCIUM 9.3 12/16/2020   GFRAA 73 08/16/2020    Speciality Comments: No specialty comments available.  Procedures:  No procedures performed Allergies: Ace inhibitors, Caffeine, Ciprofloxacin, Morphine and related, Mucinex [guaifenesin er], Nsaids, Other, and Silicone   Assessment / Plan:     Visit Diagnoses: Inflammatory arthritis - RF-, anti-CCP-, 14-3-3 eta-, ANA-, HLAB27-, uric acid 3.8. Responsive to colchicine-likely she has CPPD: She has tenderness and warmth on the dorsal aspect of both wrist joints.  She has PIP and DIP thickening consistent with severe osteoarthritis of both hands.  She is currently taking colchicine 0.6  mg 1 capsule by mouth daily.  She is tolerating colchicine without any side effects.  She continues to have chronic pain in both wrists, both hands, both ankle joints, and both feet.  We discussed the use of natural anti-inflammatories.  She is not a good candidate for NSAIDs due to history of gastric bypass, intermittently elevated creatinine, and elevated LFTs.  She takes tramadol very sparingly for pain relief but does not like to take narcotics due to the potential for dependence as well as increased constipation.  Discussed the importance of joint protection and muscle strengthening.  She will remain on colchicine as prescribed. She does not need a refill at this time.  She will follow up in 6 months.   Medication monitoring encounter: CBC updated on 01/31/21.  CMP updated on 12/16/20.  Results were reviewed with the patient today in the office.   Primary osteoarthritis of both hands: She has PIP and DIP thickening consistent with osteoarthritis of  both hands.  Discussed the use of arthritis compression gloves and natural antiinflammatories.  She was given a jar gripper for assistance as well.  Discussed a referral to pain management but she declined.  Discussed the importance of joint protection and muscle strengthening.   Trochanteric bursitis of both hips: She has tenderness to palpation over the right trochanteric bursa. She has slightly limited ROM of the right hip with some discomfort.   Primary osteoarthritis of left knee: She experiences intermittent pain in the left knee joint. She can continue to use voltaren gel topically as needed.  She has been using lioderm patches as needed. Refill for lidoderm patches was sent to the pharmacy today.   Status post total right knee replacement: Performed by Dr. Veverly [name]. Chronic pain.  She has limited extension on exam today with discomfort.  She has not noticed any instability, but she continues to have pain in the right knee replacement on a daily basis. She has updated x-rays of the right knee on 12/08/20 which were stable without complications. She does not want to schedule a follow up visit with Dr. Veverly [name], but she plans on seeking a second opinion from another orthopedist.   Primary osteoarthritis of both feet: She has chronic pain in both feet.  Tenderness to palpation over both ankle joints noted. Discussed the importance of wearing proper fitting shoes. She wears compression socks for pedal edema in both LE.  History of peripheral neuropathy: She is taking gabapentin 300 mg 4 capsules by mouth daily for management of neuropathy.  Referral to neurology was placed today as requested.   Status post gastric bypass for obesity: She is not a good candidate for NSAID use.   History of gastroesophageal reflux (GERD): She is taking protonix 40 mg BID. Discussed that protonix can increase the risk for osteoporosis.  Recommend discussing DEXA and treatment options with Dr. Loanne [name] at her upcoming  appointment.   Age-related osteoporosis without current pathological fracture: DEXA updated on 01/11/21: Right femoral neck- T-score -2.5 and a -25/9% change in BMD from previous DEXA. Reviewed DEXA results today and discussed that treatment is indicated at this time.  She has a history of a calcaneal fracture and rib fracture. Risk factors for the development of osteoporosis include the use of gabapentin, protonix, hx of gastric bypass, and several cortisone injections in the past. She would like to further discuss the DEXA results and treatment options with Dr. Loanne [name] at her upcoming appt on 03/07/21.  She is currently taking vitamin D 4000 units  daily.   Neck pain: Chronic.  She was previously having injections by her neurologist in [place].  She requested a referral to neurology in [facility] for further evaluation.   Chronic midline low back pain without sciatica: She has chronic lower back pain and stiffness.  She uses lidoderm patches as needed for pain relief.  She requested a referral to neurology.  Declined referral for pain management.   Other medical conditions are listed as follows:   History of diabetes mellitus, type II  Eosinophilic esophagitis  Positive PPD  History of uterine cancer  Postsurgical hypothyroidism  NASH (nonalcoholic steatohepatitis)  History of hyperlipidemia  Orders: Orders Placed This Encounter  Procedures  . Ambulatory referral to Neurology   Meds ordered this encounter  Medications  . lidocaine (LIDODERM) 5 %    Sig: Place 1 patch onto the skin daily. Remove & Discard patch within 12 hours or as directed by MD    Dispense:  30 patch    Refill:  0    Follow-Up Instructions: Return in about 6 months (around 08/15/2021) for Inflammatory arthritis , Osteoarthritis.   Ofilia Neas, PA-C  Note - This record has been created using Dragon software.  Chart creation errors have been sought, but may not always  have been located. Such creation  errors do not reflect on  the standard of medical care.

## 2021-01-30 NOTE — Telephone Encounter (Signed)
See note

## 2021-01-31 ENCOUNTER — Other Ambulatory Visit: Payer: Self-pay

## 2021-01-31 ENCOUNTER — Other Ambulatory Visit: Payer: Self-pay | Admitting: *Deleted

## 2021-01-31 ENCOUNTER — Inpatient Hospital Stay: Payer: Medicare Other | Attending: Physician Assistant

## 2021-01-31 ENCOUNTER — Inpatient Hospital Stay (HOSPITAL_BASED_OUTPATIENT_CLINIC_OR_DEPARTMENT_OTHER): Payer: Medicare Other | Admitting: Internal Medicine

## 2021-01-31 VITALS — BP 114/66 | HR 69 | Temp 97.9°F | Resp 17 | Ht 71.0 in | Wt 167.5 lb

## 2021-01-31 DIAGNOSIS — I2510 Atherosclerotic heart disease of native coronary artery without angina pectoris: Secondary | ICD-10-CM

## 2021-01-31 DIAGNOSIS — Z7982 Long term (current) use of aspirin: Secondary | ICD-10-CM | POA: Insufficient documentation

## 2021-01-31 DIAGNOSIS — I10 Essential (primary) hypertension: Secondary | ICD-10-CM | POA: Diagnosis not present

## 2021-01-31 DIAGNOSIS — D500 Iron deficiency anemia secondary to blood loss (chronic): Secondary | ICD-10-CM

## 2021-01-31 DIAGNOSIS — Z79899 Other long term (current) drug therapy: Secondary | ICD-10-CM | POA: Insufficient documentation

## 2021-01-31 DIAGNOSIS — D72819 Decreased white blood cell count, unspecified: Secondary | ICD-10-CM | POA: Diagnosis not present

## 2021-01-31 DIAGNOSIS — N920 Excessive and frequent menstruation with regular cycle: Secondary | ICD-10-CM | POA: Diagnosis not present

## 2021-01-31 DIAGNOSIS — Z9884 Bariatric surgery status: Secondary | ICD-10-CM | POA: Diagnosis not present

## 2021-01-31 DIAGNOSIS — Z7984 Long term (current) use of oral hypoglycemic drugs: Secondary | ICD-10-CM | POA: Insufficient documentation

## 2021-01-31 DIAGNOSIS — D539 Nutritional anemia, unspecified: Secondary | ICD-10-CM

## 2021-01-31 LAB — IRON AND TIBC
Iron: 77 ug/dL (ref 41–142)
Saturation Ratios: 28 % (ref 21–57)
TIBC: 275 ug/dL (ref 236–444)
UIBC: 198 ug/dL (ref 120–384)

## 2021-01-31 LAB — CBC WITH DIFFERENTIAL (CANCER CENTER ONLY)
Abs Immature Granulocytes: 0 10*3/uL (ref 0.00–0.07)
Basophils Absolute: 0 10*3/uL (ref 0.0–0.1)
Basophils Relative: 1 %
Eosinophils Absolute: 0.2 10*3/uL (ref 0.0–0.5)
Eosinophils Relative: 6 %
HCT: 38.2 % (ref 36.0–46.0)
Hemoglobin: 12.2 g/dL (ref 12.0–15.0)
Immature Granulocytes: 0 %
Lymphocytes Relative: 41 %
Lymphs Abs: 1.2 10*3/uL (ref 0.7–4.0)
MCH: 31.9 pg (ref 26.0–34.0)
MCHC: 31.9 g/dL (ref 30.0–36.0)
MCV: 99.7 fL (ref 80.0–100.0)
Monocytes Absolute: 0.3 10*3/uL (ref 0.1–1.0)
Monocytes Relative: 10 %
Neutro Abs: 1.2 10*3/uL — ABNORMAL LOW (ref 1.7–7.7)
Neutrophils Relative %: 42 %
Platelet Count: 168 10*3/uL (ref 150–400)
RBC: 3.83 MIL/uL — ABNORMAL LOW (ref 3.87–5.11)
RDW: 13.4 % (ref 11.5–15.5)
WBC Count: 2.8 10*3/uL — ABNORMAL LOW (ref 4.0–10.5)
nRBC: 0 % (ref 0.0–0.2)

## 2021-01-31 LAB — VITAMIN B12: Vitamin B-12: 579 pg/mL (ref 180–914)

## 2021-01-31 LAB — FERRITIN: Ferritin: 166 ng/mL (ref 11–307)

## 2021-01-31 MED ORDER — GABAPENTIN 300 MG PO CAPS: 1200.0000 mg | ORAL_CAPSULE | Freq: Three times a day (TID) | ORAL | 0 refills | Status: DC

## 2021-01-31 NOTE — Telephone Encounter (Signed)
Rx send to pharmacy  

## 2021-01-31 NOTE — Progress Notes (Signed)
[place] [phone]   [phone]  OFFICE PROGRESS NOTE  [place] [address]  DIAGNOSIS: Anemia and leukocytopenia and occasional thrombocytopenia.  PRIOR THERAPY: None  CURRENT THERAPY:  Venofer infusion weekly status post 9 doses.  INTERVAL HISTORY: Savannah [name] 65 y.o. female returns to the clinic today for follow-up visit.  The patient is feeling fine today with no concerning complaints.  She denied having any current fatigue or weakness.  She denied having any nausea, vomiting, diarrhea or constipation.  She has no headache or visual changes.  She started treatment with Neurontin recently for peripheral neuropathy.  She denied having any chest pain, shortness of breath, cough or hemoptysis.  The patient is here today for evaluation with repeat CBC, iron study and ferritin as well as vitamin B12 level.   MEDICAL HISTORY: Past Medical History:  Diagnosis Date  . ABDOMINAL PAIN, CHRONIC 10/20/2009  . Acute cystitis 08/17/2009  . ALLERGIC RHINITIS 11/21/2007  . Allergy   . ASYMPTOMATIC POSTMENOPAUSAL STATUS 09/29/2008  . Bariatric surgery status 07/07/2010  . Bariatric surgery status 07/07/2010   Qualifier: Diagnosis of  By: Loanne Drilling MD, Jacelyn Pi   . Cancer ([facility])    uterine  . Cataract   . Coronary artery disease   . DEPRESSION 11/21/2007  . DIABETES MELLITUS, TYPE II 07/08/2007  . DYSPNEA 05/20/2009  . Edema 05/20/2009  . Eosinophilic esophagitis 05/14/2201  . FEVER UNSPECIFIED 10/20/2009  . FEVER, HX OF 03/09/2010  . GERD (gastroesophageal reflux disease)   . Headache(784.0) 02/06/2010  . Hepatomegaly 01/06/2008  . HYPERLIPIDEMIA 07/08/2007  . HYPERTENSION 07/08/2007  . LEUKOPENIA, MILD 09/29/2008  . OBESITY 01/06/2008  . OSTEOARTHRITIS 01/06/2008  . Other chronic nonalcoholic liver disease 5/42/7062  . PERIPHERAL NEUROPATHY 11/21/2007  . Peripheral neuropathy   . Postsurgical hypothyroidism 09/19/2010  .  SINUSITIS- ACUTE-NOS 11/21/2007  . TB SKIN TEST, POSITIVE 02/06/2010  . THYROID NODULE 03/09/2010    ALLERGIES:  is allergic to ace inhibitors, caffeine, ciprofloxacin, morphine and related, mucinex [guaifenesin er], nsaids, other, and silicone.  MEDICATIONS:  Current Outpatient Medications  Medication Sig Dispense Refill  . 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  Correct leg length and improve pronation BL 1 each 0  . amoxicillin (AMOXIL) 500 MG capsule     . aspirin EC 81 MG tablet Take 1 tablet (81 mg total) by mouth daily. Starting 01/18/19 prior to cardiac cath    . atorvastatin (LIPITOR) 40 MG tablet Take 40 mg by mouth every evening.    . canagliflozin (INVOKANA) 300 MG TABS tablet Take 1 tablet (300 mg total) by mouth daily before breakfast. 90 tablet 3  . Cholecalciferol (VITAMIN D) 50 MCG (2000 UT) tablet Take 4,000 Units by mouth daily.    . Colchicine 0.6 MG CAPS TAKE 1 CAPSULE BY MOUTH EVERY DAY 90 capsule 0  . cyclobenzaprine (FLEXERIL) 10 MG tablet Take 10 mg by mouth at bedtime as needed for muscle spasms.    [name] desloratadine (CLARINEX) 5 MG tablet Take 5 mg by mouth daily.    . diclofenac sodium (VOLTAREN) 1 % GEL Apply 4 g topically 4 (four) times daily. (Patient taking differently: Apply 4 g topically as needed (pain).) 100 g 11  . docusate sodium (COLACE) 100 MG capsule Take 200 mg by mouth 2 (two) times daily.    . Dulaglutide (TRULICITY) 3 BJ/6.2GB SOPN Inject 3 mg as directed once a  week. 6 mL 3  . Elastic Bandages & Supports (RIB BELT/WOMENS MEDIUM) MISC Use as needed for rib pain 1 each 0  . Fluconazole (DIFLUCAN PO) Take by mouth as needed.    . furosemide (LASIX) 20 MG tablet Take 20 mg by mouth daily as needed for fluid (swelling).    . gabapentin (NEURONTIN) 300 MG capsule Take 3 capsules (900 mg total) by mouth 3 (three) times daily. 360 capsule 2  . gabapentin (NEURONTIN) 600 MG tablet Take 2 tablets (1,200 mg total) by mouth 3  (three) times daily. 180 tablet 1  . halobetasol (ULTRAVATE) 0.05 % cream Apply 1 application topically 2 (two) times daily as needed (lichen sclerosus).   2  . levothyroxine (SYNTHROID) 112 MCG tablet TAKE 1 TABLET BY MOUTH EVERY DAY 90 tablet 0  . lidocaine (LIDODERM) 5 % Place 1 patch onto the skin daily. Remove & Discard patch within 12 hours or as directed by MD 30 patch 0  . MAGNESIUM PO Take 1 tablet by mouth daily as needed (cramps).     . metFORMIN (GLUCOPHAGE-XR) 500 MG 24 hr tablet Take 4 tablets (2,000 mg total) by mouth daily. (Patient taking differently: Take 1,000 mg by mouth in the morning and at bedtime.) 360 tablet 3  . Multiple Vitamin (MULTIVITAMIN) tablet Take 1 tablet by mouth in the morning and at bedtime.    . nitroGLYCERIN (NITROSTAT) 0.4 MG SL tablet Place 1 tablet (0.4 mg total) under the tongue every 5 (five) minutes as needed. 25 tablet 3  . ONETOUCH ULTRA test strip USE TO MONITOR GLUCOSE LEVELS ONCE PER DAY E11.40 100 strip 2  . pantoprazole (PROTONIX) 40 MG tablet TAKE 1 TABLET BY MOUTH TWICE A DAY 180 tablet 1  . pantoprazole (PROTONIX) 40 MG tablet Take 1 tablet (40 mg total) by mouth 2 (two) times daily. 180 tablet 1  . phenazopyridine (PYRIDIUM) 100 MG tablet Take 100 mg by mouth 3 (three) times daily as needed for pain.    . Probiotic CAPS Take 1 capsule by mouth daily.    . repaglinide (PRANDIN) 2 MG tablet Take 1 tablet (2 mg total) by mouth 2 (two) times daily before a meal. 180 tablet 3  . traMADol (ULTRAM) 50 MG tablet Take 50 mg by mouth 2 (two) times daily as needed for moderate pain.     No current facility-administered medications for this visit.    SURGICAL HISTORY:  Past Surgical History:  Procedure Laterality Date  . ABDOMINAL HYSTERECTOMY    . BASAL CELL CARCINOMA EXCISION    . CARDIAC CATHETERIZATION    . COLONOSCOPY    . EYE SURGERY Bilateral 08/2018, 09/2018  . FOOT SURGERY Left    10/2019  . KNEE ARTHROSCOPY Left   . LEFT HEART CATH  AND CORONARY ANGIOGRAPHY N/A 01/20/2019   Procedure: LEFT HEART CATH AND CORONARY ANGIOGRAPHY;  Surgeon: Belva Crome, MD;  Location: [facility];  Service: Cardiovascular;  Laterality: N/A;  . LYMPH NODE DISSECTION    . OOPHORECTOMY    . Right total knee replacement    . ROUX-EN-Y GASTRIC BYPASS  2011  . SKIN TAG REMOVAL    . THYROIDECTOMY    . TONSILLECTOMY AND ADENOIDECTOMY    . UPPER GASTROINTESTINAL ENDOSCOPY      REVIEW OF SYSTEMS:  A comprehensive review of systems was negative.   PHYSICAL EXAMINATION: General appearance: alert, cooperative and no distress Head: Normocephalic, without obvious abnormality, atraumatic Neck: no adenopathy, no JVD, supple,  symmetrical, trachea midline and thyroid not enlarged, symmetric, no tenderness/mass/nodules Lymph nodes: Cervical, supraclavicular, and axillary nodes normal. Resp: clear to auscultation bilaterally Back: symmetric, no curvature. ROM normal. No CVA tenderness. Cardio: regular rate and rhythm, S1, S2 normal, no murmur, click, rub or gallop GI: soft, non-tender; bowel sounds normal; no masses,  no organomegaly Extremities: extremities normal, atraumatic, no cyanosis or edema  ECOG PERFORMANCE STATUS: 1 - Symptomatic but completely ambulatory  Blood pressure 114/66, pulse 69, temperature 97.9 F (36.6 C), temperature source Tympanic, resp. rate 17, height 5' 11"  (1.803 m), weight 167 lb 8 oz (76 kg), SpO2 100 %.  LABORATORY DATA: Lab Results  Component Value Date   WBC 2.8 (L) 01/31/2021   HGB 12.2 01/31/2021   HCT 38.2 01/31/2021   MCV 99.7 01/31/2021   PLT 168 01/31/2021      Chemistry      Component Value Date/Time   NA 140 12/16/2020 1540   NA 144 07/06/2019 1524   K 4.1 12/16/2020 1540   CL 104 12/16/2020 1540   CO2 22 12/16/2020 1540   BUN 25 (H) 12/16/2020 1540   BUN 22 07/06/2019 1524   CREATININE 0.97 12/16/2020 1540   CREATININE 0.94 10/11/2020 1156      Component Value Date/Time   CALCIUM 9.3  12/16/2020 1540   CALCIUM 8.9 01/30/2013 1017   ALKPHOS 98 12/16/2020 1540   AST 32 12/16/2020 1540   AST 25 02/29/2020 1303   ALT 51 (H) 12/16/2020 1540   ALT 37 02/29/2020 1303   BILITOT 0.8 12/16/2020 1540   BILITOT 0.4 02/29/2020 1303       RADIOGRAPHIC STUDIES: DG Bone Density  Result Date: 01/12/2021 Date of study: 01/11/2021 Exam: DUAL X-RAY ABSORPTIOMETRY (DXA) FOR BONE MINERAL DENSITY (BMD) Instrument: [employer] Requesting Provider: PCP Indication: screening for osteoporosis Comparison: 01/07/2009 Clinical data: Pt is a 65 y.o. female with history of calcaneal fracture.  On Calcium and vitamin D. Results:  Lumbar spine L1-L4 Femoral neck (FN) 33% distal radius T-score -2.0 RFN: -2.5 LFN: -2.0 n/a Change in BMD from previous DXA test (%) -16.4%* -25.9%* n/a (*) statistically significant Assessment: Patient has OSTEOPOROSIS according to the [facility] classification for osteoporosis (see below). Fracture risk: high Comments: the technical quality of the study is good Evaluation for secondary causes should be considered if clinically indicated. Recommend optimizing calcium (1200 mg/day) and vitamin D (800 IU/day). Treatment is indicated. Followup: Repeat BMD is appropriate after 2 years or after 1-2 years if starting treatment. WHO criteria for diagnosis of osteoporosis in postmenopausal women and in men 69 y/o or older: - normal: T-score -1.0 to + 1.0 - osteopenia/low bone density: T-score between -2.5 and -1.0 - osteoporosis: T-score below -2.5 - severe osteoporosis: T-score below -2.5 with history of fragility fracture Note: although not part of the WHO classification, the presence of a fragility fracture, regardless of the T-score, should be considered diagnostic of osteoporosis, provided other causes for the fracture have been excluded. Treatment: The National Osteoporosis Foundation recommends that treatment be considered in postmenopausal women and men age 23 or older with: 1. Hip or  vertebral (clinical or morphometric) fracture 2. T-score of - 2.5 or lower at the spine or hip 3. 10-year fracture probability by FRAX of at least 20% for a major osteoporotic fracture and 3% for a hip fracture Philemon Kingdom, MD [facility]   US ABDOMEN RUQ W/ELASTOGRAPHY  Result Date: 01/16/2021 CLINICAL DATA:  Elevated liver function tests. EXAM: US ABDOMEN LIMITED -  RIGHT UPPER QUADRANT ULTRASOUND HEPATIC ELASTOGRAPHY TECHNIQUE: Sonography of the right upper quadrant was performed. In addition, ultrasound elastography evaluation of the liver was performed. A region of interest was placed within the right lobe of the liver. Following application of a compressive sonographic pulse, tissue compressibility was assessed. Multiple assessments were performed at the selected site. Median tissue compressibility was determined. Previously, hepatic stiffness was assessed by shear wave velocity. Based on recently published Society of Radiologists in Ultrasound consensus article, reporting is now recommended to be performed in the SI units of pressure (kiloPascals) representing hepatic stiffness/elasticity. The obtained result is compared to the published reference standards. (cACLD = compensated Advanced Chronic Liver Disease) COMPARISON:  07/09/2019 CT abdomen/pelvis FINDINGS: ULTRASOUND ABDOMEN LIMITED RIGHT UPPER QUADRANT Gallbladder: Layering mobile sludge and clustered subcentimeter shadowing gallstones in the gallbladder. No definite gallbladder wall thickening. No sonographic Murphy's sign. No pericholecystic fluid. Common bile duct: Diameter: 7 mm, unchanged from 05/27/2019 sonogram using similar measurement technique. No choledocholithiasis demonstrated, noting nonvisualization of the distal CBD due to overlying bowel gas. Liver: No focal lesion identified. Within normal limits in parenchymal echogenicity. Portal vein is patent on color Doppler imaging with normal direction of blood flow towards the  liver. ULTRASOUND HEPATIC ELASTOGRAPHY Device: Siemens Helix VTQ Patient position: Oblique Transducer 6C1 Number of measurements: 10 Hepatic segment:  8 Median kPa: 4.8 IQR: 1.2 IQR/Median kPa ratio: 0.3 Data quality: IQR/Median kPa ratio of 0.3 or greater indicates reduced accuracy Diagnostic category:  < or = 5 kPa: high probability of being normal The use of hepatic elastography is applicable to patients with viral hepatitis and non-alcoholic fatty liver disease. At this time, there is insufficient data for the referenced cut-off values and use in other causes of liver disease, including alcoholic liver disease. Patients, however, may be assessed by elastography and serve as their own reference standard/baseline. In patients with non-alcoholic liver disease, the values suggesting compensated advanced chronic liver disease (cACLD) may be lower, and patients may need additional testing with elasticity results of 7-9 kPa. Please note that abnormal hepatic elasticity and shear wave velocities may also be identified in clinical settings other than with hepatic fibrosis, such as: acute hepatitis, elevated right heart and central venous pressures including use of beta blockers, veno-occlusive disease (Budd-Chiari), infiltrative processes such as mastocytosis/amyloidosis/infiltrative tumor/lymphoma, extrahepatic cholestasis, with hyperemia in the post-prandial state, and with liver transplantation. Correlation with patient history, laboratory data, and clinical condition recommended. Diagnostic Categories: < or =5 kPa: high probability of being normal < or =9 kPa: in the absence of other known clinical signs, rules out cACLD >9 kPa and ?13 kPa: suggestive of cACLD, but needs further testing >13 kPa: highly suggestive of cACLD > or =17 kPa: highly suggestive of cACLD with an increased probability of clinically significant portal hypertension IMPRESSION: ULTRASOUND RUQ: 1. Cholelithiasis.  No evidence of acute  cholecystitis. 2. Chronic mild common bile duct dilation (7 mm diameter), unchanged since 05/27/2019 sonogram. If there is clinical concern for choledocholithiasis, MRI abdomen with MRCP without and with IV contrast may be obtained for further evaluation. 3. Normal liver. ULTRASOUND HEPATIC ELASTOGRAPHY: Median kPa:  4.8 Diagnostic category:  < or = 5 kPa: high probability of being normal Data quality: IQR/Median kPa ratio of 0.3 indicates reduced accuracy Electronically Signed   By: Ilona Sorrel M.D.   On: 01/16/2021 12:54    ASSESSMENT AND PLAN: This is a 65 years old white female with history of iron deficiency anemia secondary to menorrhagia.  The patient started  treatment with iron infusion with Venofer for four doses. She continues to have persistent fatigue but repeat CBC and iron study as well as ferritin today showed significant improvement in her condition with normalization of her hemoglobin and hematocrit as well as the serum iron and ferritin. She was treated with Venofer infusion for 9 doses.  The patient is currently on observation and she is feeling fine with no concerning complaints. Repeat CBC today showed mild leukocytopenia and neutropenia.  This is likely drug-induced especially with the recent addition of gabapentin as well as her cholesterol medication. She will need to be monitored closely by her primary care physician with repeat CBC at regular basis to ensure resolution of this problem. Her iron study and ferritin are still pending.  If they are in the normal range the patient would like to come on as-needed basis in the future. I will not give her a follow-up appointment at this time but I will be happy to see her in the future for any other abnormality or need for iron infusion. She was advised to call if she has any concerning symptoms. The patient voices understanding of current disease status and treatment options and is in agreement with the current care plan.  All  questions were answered. The patient knows to call the clinic with any problems, questions or concerns. We can certainly see the patient much sooner if necessary.  Disclaimer: This note was dictated with voice recognition software. Similar sounding words can inadvertently be transcribed and may not be corrected upon review.

## 2021-02-12 ENCOUNTER — Other Ambulatory Visit: Payer: Self-pay | Admitting: Endocrinology

## 2021-02-13 ENCOUNTER — Encounter: Payer: Self-pay | Admitting: Physician Assistant

## 2021-02-13 ENCOUNTER — Ambulatory Visit (INDEPENDENT_AMBULATORY_CARE_PROVIDER_SITE_OTHER): Payer: Medicare Other | Admitting: Physician Assistant

## 2021-02-13 ENCOUNTER — Other Ambulatory Visit: Payer: Self-pay

## 2021-02-13 VITALS — BP 103/71 | HR 72 | Resp 14 | Ht 71.0 in | Wt 167.8 lb

## 2021-02-13 DIAGNOSIS — M19041 Primary osteoarthritis, right hand: Secondary | ICD-10-CM

## 2021-02-13 DIAGNOSIS — Z9884 Bariatric surgery status: Secondary | ICD-10-CM | POA: Diagnosis not present

## 2021-02-13 DIAGNOSIS — G5603 Carpal tunnel syndrome, bilateral upper limbs: Secondary | ICD-10-CM

## 2021-02-13 DIAGNOSIS — M545 Low back pain, unspecified: Secondary | ICD-10-CM

## 2021-02-13 DIAGNOSIS — Z8669 Personal history of other diseases of the nervous system and sense organs: Secondary | ICD-10-CM

## 2021-02-13 DIAGNOSIS — Z8639 Personal history of other endocrine, nutritional and metabolic disease: Secondary | ICD-10-CM

## 2021-02-13 DIAGNOSIS — M19072 Primary osteoarthritis, left ankle and foot: Secondary | ICD-10-CM

## 2021-02-13 DIAGNOSIS — K7581 Nonalcoholic steatohepatitis (NASH): Secondary | ICD-10-CM

## 2021-02-13 DIAGNOSIS — M7062 Trochanteric bursitis, left hip: Secondary | ICD-10-CM

## 2021-02-13 DIAGNOSIS — M1712 Unilateral primary osteoarthritis, left knee: Secondary | ICD-10-CM

## 2021-02-13 DIAGNOSIS — M19071 Primary osteoarthritis, right ankle and foot: Secondary | ICD-10-CM | POA: Diagnosis not present

## 2021-02-13 DIAGNOSIS — G8929 Other chronic pain: Secondary | ICD-10-CM

## 2021-02-13 DIAGNOSIS — K200 Eosinophilic esophagitis: Secondary | ICD-10-CM | POA: Diagnosis not present

## 2021-02-13 DIAGNOSIS — R7611 Nonspecific reaction to tuberculin skin test without active tuberculosis: Secondary | ICD-10-CM

## 2021-02-13 DIAGNOSIS — M1380 Other specified arthritis, unspecified site: Secondary | ICD-10-CM

## 2021-02-13 DIAGNOSIS — E890 Postprocedural hypothyroidism: Secondary | ICD-10-CM

## 2021-02-13 DIAGNOSIS — Z8719 Personal history of other diseases of the digestive system: Secondary | ICD-10-CM

## 2021-02-13 DIAGNOSIS — M810 Age-related osteoporosis without current pathological fracture: Secondary | ICD-10-CM

## 2021-02-13 DIAGNOSIS — M542 Cervicalgia: Secondary | ICD-10-CM

## 2021-02-13 DIAGNOSIS — Z5181 Encounter for therapeutic drug level monitoring: Secondary | ICD-10-CM

## 2021-02-13 DIAGNOSIS — M7061 Trochanteric bursitis, right hip: Secondary | ICD-10-CM

## 2021-02-13 DIAGNOSIS — Z96651 Presence of right artificial knee joint: Secondary | ICD-10-CM | POA: Diagnosis not present

## 2021-02-13 DIAGNOSIS — Z8542 Personal history of malignant neoplasm of other parts of uterus: Secondary | ICD-10-CM

## 2021-02-13 DIAGNOSIS — M19042 Primary osteoarthritis, left hand: Secondary | ICD-10-CM

## 2021-02-13 DIAGNOSIS — M1990 Unspecified osteoarthritis, unspecified site: Secondary | ICD-10-CM | POA: Diagnosis not present

## 2021-02-13 MED ORDER — LIDOCAINE 5 % EX PTCH: 1.0000 | MEDICATED_PATCH | CUTANEOUS | 0 refills | Status: DC

## 2021-02-14 ENCOUNTER — Telehealth: Payer: Self-pay | Admitting: *Deleted

## 2021-02-14 NOTE — Telephone Encounter (Addendum)
Received notification from [facility] regarding a prior authorization for Lidocaine 5% patches. Authorization has been APPROVED from 01/15/2021 to 02/14/2022

## 2021-02-15 ENCOUNTER — Telehealth: Payer: Self-pay

## 2021-02-15 NOTE — Telephone Encounter (Signed)
PA has been approved for Trulicity 5QG/9.2EF pen from 01/15/2021-02/14/2022.

## 2021-03-02 ENCOUNTER — Other Ambulatory Visit: Payer: Self-pay | Admitting: Family Medicine

## 2021-03-02 ENCOUNTER — Telehealth: Payer: Self-pay | Admitting: Internal Medicine

## 2021-03-02 ENCOUNTER — Other Ambulatory Visit: Payer: Self-pay | Admitting: Endocrinology

## 2021-03-02 DIAGNOSIS — I517 Cardiomegaly: Secondary | ICD-10-CM

## 2021-03-02 NOTE — Telephone Encounter (Signed)
Attempted to call patient, phone answered but unable to hear anything from other line. Will attempt to call again at later time.

## 2021-03-02 NOTE — Telephone Encounter (Signed)
Patient is requesting an order for an echocardiogram.

## 2021-03-03 NOTE — Telephone Encounter (Signed)
Spoke with patient who states that she was calling in regard to scheduling her Echocardiogram now that her rib injury has healed.   Advised patient that I would forward message to Dr. Margaretann Loveless to get order for Echo.   Advised patient that someone would call her back to get this scheduled for her if ordered.   Patient verbalized understanding.

## 2021-03-06 NOTE — Telephone Encounter (Signed)
Per Dr. Margaretann Loveless- okay to order Echo, order has been placed. Message sent to scheduling.

## 2021-03-07 ENCOUNTER — Ambulatory Visit: Payer: Medicare Other | Admitting: Endocrinology

## 2021-03-07 ENCOUNTER — Ambulatory Visit: Payer: Medicare Other | Admitting: Internal Medicine

## 2021-03-21 ENCOUNTER — Telehealth: Payer: Self-pay | Admitting: Family Medicine

## 2021-03-21 ENCOUNTER — Ambulatory Visit (HOSPITAL_COMMUNITY): Payer: Medicare Other

## 2021-03-21 NOTE — Chronic Care Management (AMB) (Signed)
  Chronic Care Management   Note  03/21/2021 Name: Savannah [name] MRN: [id number] DOB: 01-Mar-1956  Savannah [name] is a 65 y.o. year old female who is a primary care patient of Jerline Pain Algis Greenhouse, MD. I reached out to Leeann Must by phone today in response to a referral sent by Savannah [name] PCP, Vivi Barrack, MD.   Ms. Blalock was given information about Chronic Care Management services today including:  1. CCM service includes personalized support from designated clinical staff supervised by her physician, including individualized plan of care and coordination with other care providers 2. 24/7 contact phone numbers for assistance for urgent and routine care needs. 3. Service will only be billed when office clinical staff spend 20 minutes or more in a month to coordinate care. 4. Only one practitioner may furnish and bill the service in a calendar month. 5. The patient may stop CCM services at any time (effective at the end of the month) by phone call to the office staff.   Patient did not agree to enrollment in care management services and does not wish to consider at this time.  Follow up plan:   Lauretta Grill Upstream Scheduler

## 2021-03-22 ENCOUNTER — Other Ambulatory Visit: Payer: Self-pay | Admitting: Endocrinology

## 2021-03-23 ENCOUNTER — Ambulatory Visit: Payer: Medicare Other | Attending: Internal Medicine

## 2021-03-23 ENCOUNTER — Telehealth (INDEPENDENT_AMBULATORY_CARE_PROVIDER_SITE_OTHER): Payer: Medicare Other | Admitting: Family Medicine

## 2021-03-23 DIAGNOSIS — Z20822 Contact with and (suspected) exposure to covid-19: Secondary | ICD-10-CM

## 2021-03-23 DIAGNOSIS — I2510 Atherosclerotic heart disease of native coronary artery without angina pectoris: Secondary | ICD-10-CM

## 2021-03-23 DIAGNOSIS — R0981 Nasal congestion: Secondary | ICD-10-CM | POA: Diagnosis not present

## 2021-03-23 NOTE — Progress Notes (Signed)
Virtual Visit via Video Note  I connected with Savannah [name]  on 03/23/21 at  3:00 PM EDT by a video enabled telemedicine application and verified that I am speaking with the correct person using two identifiers.  Location patient: home, [place] Location provider:work or home office Persons participating in the virtual visit: patient, provider  I discussed the limitations of evaluation and management by telemedicine and the availability of in person appointments. The patient expressed understanding and agreed to proceed.   HPI:  Acute telemedicine visit for nasal congestion: -Onset: about 7 days or so -Symptoms include: nasal congestion, runny nose, chills for a few days initially, HA initially, has felt more tired than usual, loss of taste -feeling much better today -Denies:CP, SOB, Cough, fever, NVD, inability to eat/drink/get out of bed -Pertinent past medical history:see below -Pertinent medication allergies: Allergies  Allergen Reactions  . Ace Inhibitors Cough  . Caffeine     PVCs  . Ciprofloxacin Itching  . Morphine And Related Itching    Pt tolerates medication if given with diphenhydramine  . Mucinex [Guaifenesin Er]     PVCs  . Nsaids     Gastric Bypass Surgery - unable to take, tolerates ec 81 aspirin   . Other     Antihistamine-alkylamine - PVCs tolerates benadryl   . Silicone Hives    -[id number] vaccine status: fully vaccinated and boosted (x1)  ROS: See pertinent positives and negatives per HPI.  Past Medical History:  Diagnosis Date  . ABDOMINAL PAIN, CHRONIC 10/20/2009  . Acute cystitis 08/17/2009  . ALLERGIC RHINITIS 11/21/2007  . Allergy   . ASYMPTOMATIC POSTMENOPAUSAL STATUS 09/29/2008  . Bariatric surgery status 07/07/2010  . Bariatric surgery status 07/07/2010   Qualifier: Diagnosis of  By: Loanne Drilling MD, Jacelyn Pi   . Cancer ([place])    uterine  . Cataract   . Coronary artery disease   . DEPRESSION 11/21/2007  . DIABETES MELLITUS, TYPE II 07/08/2007  . DYSPNEA 05/20/2009   . Edema 05/20/2009  . Eosinophilic esophagitis 01/14/5973  . FEVER UNSPECIFIED 10/20/2009  . FEVER, HX OF 03/09/2010  . GERD (gastroesophageal reflux disease)   . Headache(784.0) 02/06/2010  . Hepatomegaly 01/06/2008  . HYPERLIPIDEMIA 07/08/2007  . HYPERTENSION 07/08/2007  . LEUKOPENIA, MILD 09/29/2008  . OBESITY 01/06/2008  . OSTEOARTHRITIS 01/06/2008  . Other chronic nonalcoholic liver disease 1/63/8453  . PERIPHERAL NEUROPATHY 11/21/2007  . Peripheral neuropathy   . Postsurgical hypothyroidism 09/19/2010  . SINUSITIS- ACUTE-NOS 11/21/2007  . TB SKIN TEST, POSITIVE 02/06/2010  . THYROID NODULE 03/09/2010    Past Surgical History:  Procedure Laterality Date  . ABDOMINAL HYSTERECTOMY    . BASAL CELL CARCINOMA EXCISION    . CARDIAC CATHETERIZATION    . COLONOSCOPY    . EYE SURGERY Bilateral 08/2018, 09/2018  . FOOT SURGERY Left    10/2019  . KNEE ARTHROSCOPY Left   . LEFT HEART CATH AND CORONARY ANGIOGRAPHY N/A 01/20/2019   Procedure: LEFT HEART CATH AND CORONARY ANGIOGRAPHY;  Surgeon: Belva Crome, MD;  Location: [facility];  Service: Cardiovascular;  Laterality: N/A;  . LYMPH NODE DISSECTION    . OOPHORECTOMY    . Right total knee replacement    . ROUX-EN-Y GASTRIC BYPASS  2011  . SKIN TAG REMOVAL    . THYROIDECTOMY    . TONSILLECTOMY AND ADENOIDECTOMY    . UPPER GASTROINTESTINAL ENDOSCOPY       Current Outpatient Medications:  .  AMBULATORY NON FORMULARY MEDICATION, Administrator at [employer]  Correct leg length and improve pronation BL, Disp: 1 each, Rfl: 0 .  amoxicillin (AMOXIL) 500 MG capsule, as needed., Disp: , Rfl:  .  aspirin EC 81 MG tablet, Take 1 tablet (81 mg total) by mouth daily. Starting 01/18/19 prior to cardiac cath, Disp: , Rfl:  .  atorvastatin (LIPITOR) 40 MG tablet, Take 40 mg by mouth every evening., Disp: , Rfl:  .  Cholecalciferol (VITAMIN D) 50 MCG (2000 UT) tablet, Take 4,000 Units by mouth daily., Disp: , Rfl:  .  Colchicine 0.6 MG  CAPS, TAKE 1 CAPSULE BY MOUTH EVERY DAY, Disp: 90 capsule, Rfl: 0 .  cyclobenzaprine (FLEXERIL) 10 MG tablet, Take 10 mg by mouth at bedtime as needed for muscle spasms., Disp: , Rfl:  .  desloratadine (CLARINEX) 5 MG tablet, Take 5 mg by mouth daily., Disp: , Rfl:  .  diclofenac sodium (VOLTAREN) 1 % GEL, Apply 4 g topically 4 (four) times daily. (Patient taking differently: Apply 4 g topically as needed (pain).), Disp: 100 g, Rfl: 11 .  docusate sodium (COLACE) 100 MG capsule, Take 200 mg by mouth 2 (two) times daily., Disp: , Rfl:  .  Dulaglutide (TRULICITY) 3 HX/5.0VW SOPN, Inject 3 mg as directed once a week., Disp: 6 mL, Rfl: 3 .  Elastic Bandages & Supports (RIB BELT/WOMENS MEDIUM) MISC, Use as needed for rib pain, Disp: 1 each, Rfl: 0 .  Fluconazole (DIFLUCAN PO), Take by mouth as needed., Disp: , Rfl:  .  gabapentin (NEURONTIN) 300 MG capsule, TAKE 4 CAPSULES BY MOUTH 3 TIMES DAILY., Disp: 360 capsule, Rfl: 0 .  halobetasol (ULTRAVATE) 0.05 % cream, Apply 1 application topically 2 (two) times daily as needed (lichen sclerosus). , Disp: , Rfl: 2 .  INVOKANA 300 MG TABS tablet, TAKE 1 TABLET (300 MG TOTAL) BY MOUTH DAILY BEFORE BREAKFAST. (Patient taking differently: Take 150 mg by mouth daily.), Disp: 90 tablet, Rfl: 3 .  levothyroxine (SYNTHROID) 112 MCG tablet, TAKE 1 TABLET BY MOUTH EVERY DAY, Disp: 90 tablet, Rfl: 2 .  lidocaine (LIDODERM) 5 %, Place 1 patch onto the skin daily. Remove & Discard patch within 12 hours or as directed by MD, Disp: 30 patch, Rfl: 0 .  MAGNESIUM PO, Take 1 tablet by mouth daily as needed (cramps). , Disp: , Rfl:  .  metFORMIN (GLUCOPHAGE-XR) 500 MG 24 hr tablet, TAKE 4 TABLETS BY MOUTH DAILY., Disp: 360 tablet, Rfl: 3 .  Multiple Vitamin (MULTIVITAMIN) tablet, Take 1 tablet by mouth in the morning and at bedtime., Disp: , Rfl:  .  nitroGLYCERIN (NITROSTAT) 0.4 MG SL tablet, Place 1 tablet (0.4 mg total) under the tongue every 5 (five) minutes as needed., Disp:  25 tablet, Rfl: 3 .  ONETOUCH ULTRA test strip, USE TO MONITOR GLUCOSE LEVELS ONCE PER DAY E11.40, Disp: 100 strip, Rfl: 2 .  pantoprazole (PROTONIX) 40 MG tablet, TAKE 1 TABLET BY MOUTH TWICE A DAY, Disp: 180 tablet, Rfl: 1 .  phenazopyridine (PYRIDIUM) 100 MG tablet, Take 100 mg by mouth 3 (three) times daily as needed for pain., Disp: , Rfl:  .  Probiotic CAPS, Take 1 capsule by mouth daily., Disp: , Rfl:  .  repaglinide (PRANDIN) 2 MG tablet, Take 1 tablet (2 mg total) by mouth 2 (two) times daily before a meal., Disp: 180 tablet, Rfl: 3 .  traMADol (ULTRAM) 50 MG tablet, Take 50 mg by mouth 2 (two) times daily as needed for moderate pain., Disp: , Rfl:  .  TRULICITY 1.5 SL/3.7DS SOPN, INJECT 1.5 MG INTO THE SKIN ONCE A WEEK., Disp: 6 mL, Rfl: 0  EXAM:  VITALS per patient if applicable:  GENERAL: alert, oriented, appears well and in no acute distress  HEENT: atraumatic, conjunttiva clear, no obvious abnormalities on inspection of external nose and ears  NECK: normal movements of the head and neck  LUNGS: on inspection no signs of respiratory distress, breathing rate appears normal, no obvious gross SOB, gasping or wheezing  CV: no obvious cyanosis  MS: moves all visible extremities without noticeable abnormality  PSYCH/NEURO: pleasant and cooperative, no obvious depression or anxiety, speech and thought processing grossly intact  ASSESSMENT AND PLAN:  Discussed the following assessment and plan:  Nasal congestion  -we discussed possible serious and likely etiologies, options for evaluation and workup, limitations of telemedicine visit vs in person visit, treatment, treatment risks and precautions. Pt prefers to treat via telemedicine empirically rather than in person at this moment. She reports she is doing better today but wondered about covid testing since lost her taste. She has a test scheduled at her PCP office and we also discussed rapid home test and other testing options.  Since improving she has opted for symptomatic care at this point with nasal saline, possible short course nasal decongestant and other home care measures s Scheduled follow up with PCP offered:follow up as needed Advised to seek prompt in person care if worsening, new symptoms arise, or if is not improving with treatment. Discussed options for inperson care if PCP office not available. Did let this patient know that I only do telemedicine on Tuesdays and Thursdays for [place]. Advised to schedule follow up visit with PCP or [facility] if any further questions or concerns to avoid delays in care.   I discussed the assessment and treatment plan with the patient. The patient was provided an opportunity to ask questions and all were answered. The patient agreed with the plan and demonstrated an understanding of the instructions.     Lucretia Kern, DO

## 2021-03-23 NOTE — Patient Instructions (Signed)
  HOME CARE TIPS:  -[place] testing information: https://www.[facility].org/ OR [id number] Most pharmacies also offer testing and home test kits.  -can use tylenol if needed for fevers, aches and pains per instructions  -can use nasal saline a few times per day if you have nasal congestion; sometimes  a short course of Afrin nasal spray for 3 days can help with symptoms as well  -stay hydrated, drink plenty of fluids and eat small healthy meals - avoid dairy  -can take 1000 IU (96mg) Vit D3 and 100-500 mg of Vit C daily per instructions  -If the Covid test is positive, check out the [facility] for more information on home care, transmission and treatment for COVID19  -follow up with your doctor in 2-3 days unless improving and feeling better  -stay home while sick, except to seek medical care. If you have COVID19, ideally it would be best to stay home for a full 10 days since the onset of symptoms PLUS one day of no fever and feeling better. Wear a good mask that fits snugly (such as N95 or KN95) if around others to reduce the risk of transmission.  It was nice to meet you today, and I really hope you are feeling better soon. I help [facility] out with telemedicine visits on Tuesdays and Thursdays and am available for visits on those days. If you have any concerns or questions following this visit please schedule a follow up visit with your Primary Care doctor or seek care at a local urgent care clinic to avoid delays in care.    Seek in person care or schedule a follow up video visit promptly if your symptoms worsen, new concerns arise or you are not improving with treatment. Call 911 and/or seek emergency care if your symptoms are severe or life threatening.

## 2021-03-24 LAB — NOVEL CORONAVIRUS, NAA: SARS-CoV-2, NAA: DETECTED — AB

## 2021-03-24 LAB — SARS-COV-2, NAA 2 DAY TAT

## 2021-03-27 ENCOUNTER — Telehealth: Payer: Self-pay

## 2021-03-27 ENCOUNTER — Ambulatory Visit: Payer: Medicare Other | Admitting: Endocrinology

## 2021-03-27 NOTE — Telephone Encounter (Signed)
Pt called and asked if Dr. Jerline Pain could place a new neuro referral to [facility]. She no longer wants to go to the current neurology office she is established at. Please advise.

## 2021-03-28 NOTE — Telephone Encounter (Signed)
Ok with me. Please place any necessary orders. 

## 2021-03-28 NOTE — Telephone Encounter (Signed)
Please advise 

## 2021-03-29 ENCOUNTER — Other Ambulatory Visit: Payer: Self-pay | Admitting: *Deleted

## 2021-03-29 DIAGNOSIS — Z8669 Personal history of other diseases of the nervous system and sense organs: Secondary | ICD-10-CM

## 2021-03-29 NOTE — Telephone Encounter (Signed)
Referral placed.

## 2021-04-03 ENCOUNTER — Other Ambulatory Visit: Payer: Self-pay | Admitting: Internal Medicine

## 2021-04-03 ENCOUNTER — Other Ambulatory Visit: Payer: Self-pay | Admitting: Family Medicine

## 2021-04-03 ENCOUNTER — Other Ambulatory Visit: Payer: Self-pay | Admitting: Rheumatology

## 2021-04-03 NOTE — Telephone Encounter (Signed)
Patient should not take colchicine while she is on Diflucan.

## 2021-04-03 NOTE — Telephone Encounter (Signed)
I called patient, patient verbalized understanding.

## 2021-04-03 NOTE — Telephone Encounter (Signed)
Next Visit: 08/14/2021  Last Visit: 4/4/42022  Last Fill: 12/19/2020   DX:  Inflammatory arthritis  Current Dose per office note 02/13/2021,  colchicine 0.6 mg 1 capsule by mouth daily  Labs: 01/31/2021, WBC 2.8, RBC 3.83, Neutro Abs 1.2, 12/16/2020, Glucose 137, BUN 25, ALT 51,   Okay to refill colchicine?

## 2021-04-04 ENCOUNTER — Ambulatory Visit (HOSPITAL_COMMUNITY)
Admission: RE | Admit: 2021-04-04 | Discharge: 2021-04-04 | Disposition: A | Discharge status: Home / Self Care | Payer: Medicare Other | Source: Ambulatory Visit | Attending: Internal Medicine | Admitting: Internal Medicine

## 2021-04-04 ENCOUNTER — Ambulatory Visit (INDEPENDENT_AMBULATORY_CARE_PROVIDER_SITE_OTHER): Payer: Medicare Other | Admitting: Orthopaedic Surgery

## 2021-04-04 ENCOUNTER — Ambulatory Visit (HOSPITAL_COMMUNITY): Admission: RE | Admit: 2021-04-04 | Payer: Medicare Other | Source: Ambulatory Visit

## 2021-04-04 ENCOUNTER — Other Ambulatory Visit: Payer: Self-pay

## 2021-04-04 ENCOUNTER — Encounter (HOSPITAL_COMMUNITY): Payer: Self-pay

## 2021-04-04 ENCOUNTER — Telehealth: Payer: Self-pay

## 2021-04-04 DIAGNOSIS — I517 Cardiomegaly: Secondary | ICD-10-CM | POA: Diagnosis not present

## 2021-04-04 DIAGNOSIS — E119 Type 2 diabetes mellitus without complications: Secondary | ICD-10-CM | POA: Diagnosis not present

## 2021-04-04 DIAGNOSIS — G5601 Carpal tunnel syndrome, right upper limb: Secondary | ICD-10-CM | POA: Diagnosis not present

## 2021-04-04 DIAGNOSIS — I2510 Atherosclerotic heart disease of native coronary artery without angina pectoris: Secondary | ICD-10-CM | POA: Diagnosis not present

## 2021-04-04 DIAGNOSIS — G5602 Carpal tunnel syndrome, left upper limb: Secondary | ICD-10-CM

## 2021-04-04 DIAGNOSIS — I340 Nonrheumatic mitral (valve) insufficiency: Secondary | ICD-10-CM | POA: Diagnosis not present

## 2021-04-04 DIAGNOSIS — I422 Other hypertrophic cardiomyopathy: Secondary | ICD-10-CM | POA: Diagnosis not present

## 2021-04-04 DIAGNOSIS — E785 Hyperlipidemia, unspecified: Secondary | ICD-10-CM | POA: Diagnosis not present

## 2021-04-04 LAB — ECHOCARDIOGRAM COMPLETE
Area-P 1/2: 3.54 cm2
MV M vel: 5.06 m/s
MV Peak grad: 102.4 mmHg
S' Lateral: 3.2 cm

## 2021-04-04 MED ORDER — LIDOCAINE HCL 1 % IJ SOLN
1.0000 mL | INTRAMUSCULAR | Status: AC | PRN
  Administered 2021-04-04: 1 mL

## 2021-04-04 MED ORDER — BUPIVACAINE HCL 0.5 % IJ SOLN
1.0000 mL | INTRAMUSCULAR | Status: AC | PRN
  Administered 2021-04-04: 1 mL

## 2021-04-04 MED ORDER — METHYLPREDNISOLONE ACETATE 40 MG/ML IJ SUSP
40.0000 mg | INTRAMUSCULAR | Status: AC | PRN
  Administered 2021-04-04: 40 mg

## 2021-04-04 NOTE — Telephone Encounter (Signed)
thanks

## 2021-04-04 NOTE — Telephone Encounter (Signed)
FYI  Pt called and stated she is in a lots of pain since her injection this morning. I advised her that this is normal and to ice and take something for pain and to call us back if it continues to get worse. She stated understanding

## 2021-04-04 NOTE — Progress Notes (Signed)
Office Visit Note   Patient: Savannah [name]           Date of Birth: 1956-11-09           MRN: [id number] Visit Date: 04/04/2021              Requested by: Vivi Barrack, MD [address] [place],  [place] [address] PCP: Vivi Barrack, MD   Assessment & Plan: Visit Diagnoses:  1. Right carpal tunnel syndrome   2. Left carpal tunnel syndrome     Plan: Impression bilateral carpal tunnel syndrome.  We will obtain nerve conduction studies from [facility] neurology.  We did provide her with bilateral carpal tunnel injections today.  I have asked her to follow-up with [place] once the injections wear off or if they fail to provide her with any relief.  Follow-Up Instructions: Return if symptoms worsen or fail to improve.   Orders:  No orders of the defined types were placed in this encounter.  No orders of the defined types were placed in this encounter.     Procedures: Hand/UE Inj: bilateral carpal tunnel for carpal tunnel syndrome on 04/04/2021 12:07 PM Indications: pain Details: 25 G needle Medications (Right): 1 mL lidocaine 1 %; 1 mL bupivacaine 0.5 %; 40 mg methylPREDNISolone acetate 40 MG/ML Medications (Left): 1 mL lidocaine 1 %; 1 mL bupivacaine 0.5 %; 40 mg methylPREDNISolone acetate 40 MG/ML Outcome: tolerated well, no immediate complications Patient was prepped and draped in the usual sterile fashion.       Clinical Data: No additional findings.   Subjective: Chief Complaint  Patient presents with  . Right Wrist - Pain  . Left Wrist - Pain    [name] returns today for evaluation of bilateral hand and wrist pain.  She had nerve conduction studies done at [facility] which are not available for review today but she per patient's report they showed carpal tunnel syndrome but she does not remember the severity.  She has been wearing carpal tunnel splints since then which do help.  She takes gabapentin at a high dosage at baseline.  She uses her hands a lot for  cooking and she has had trouble because of her symptoms.   Review of Systems  Constitutional: Negative.   HENT: Negative.   Eyes: Negative.   Respiratory: Negative.   Cardiovascular: Negative.   Endocrine: Negative.   Musculoskeletal: Negative.   Neurological: Negative.   Hematological: Negative.   Psychiatric/Behavioral: Negative.   All other systems reviewed and are negative.    Objective: Vital Signs: There were no vitals taken for this visit.  Physical Exam Vitals and nursing note reviewed.  Constitutional:      Appearance: She is well-developed.  Pulmonary:     Effort: Pulmonary effort is normal.  Skin:    General: Skin is warm.     Capillary Refill: Capillary refill takes less than 2 seconds.  Neurological:     Mental Status: She is alert and oriented to person, place, and time.  Psychiatric:        Behavior: Behavior normal.        Thought Content: Thought content normal.        Judgment: Judgment normal.     Ortho Exam Bilateral hands show positive carpal tunnel compressive signs.  Mild atrophy to the thenar compartment.  Slight decrease sensation to the fingertips.  Normal temperature and turgor. Specialty Comments:  No specialty comments available.  Imaging: No results found.  PMFS History: Patient Active Problem List   Diagnosis Date Noted  . Osteopenia 01/14/2021  . Cholelithiasis 01/02/2021  . Sesamoiditis of left foot 11/09/2020  . Posterior tibial tendinitis of left lower extremity 11/09/2020  . Type 2 diabetes mellitus with diabetic peripheral angiopathy without gangrene, with long-term current use of insulin ([place]) 10/21/2020  . Iron deficiency anemia 03/14/2020  . Deficiency anemia 02/29/2020  . Lesion of vulva 09/02/2019  . Menorrhagia 09/02/2019  . Neuropathy 09/02/2019  . Abdominal pain, epigastric 06/25/2019  . Irregular bowel habits 06/25/2019  . Coronary artery disease involving native coronary artery of native heart without  angina pectoris   . Temporomandibular joint disorder 12/05/2018  . Fatigue 12/05/2018  . Inverted nipple 12/05/2018  . Senile purpura ([place]) 11/21/2018  . GERD with esophagitis 11/21/2018  . Pseudogout involving multiple joints 11/21/2018  . Lipoma 07/22/2017  . Lichen sclerosus 44/31/5400  . H/O gastric bypass 11/16/2016  . GERD (gastroesophageal reflux disease) 11/16/2016  . Depression 11/16/2016  . Diabetic polyneuropathy associated with type 2 diabetes mellitus ([place]) 11/16/2016  . Hyperlipidemia 11/16/2016  . Left lower quadrant pain 11/16/2016  . Uterine cancer [facility]) s/p hysterectomy 1997 01/04/2012  . Postsurgical hypothyroidism 09/19/2010  . Generalized abdominal pain 10/20/2009  . Diabetic neuropathy ([place]) 11/21/2007  . Allergic rhinitis 11/21/2007  . Type 2 diabetes mellitus with diabetic neuropathy, unspecified ([place]) 07/08/2007  . Dyslipidemia associated with type 2 diabetes mellitus ([place]) 07/08/2007   Past Medical History:  Diagnosis Date  . ABDOMINAL PAIN, CHRONIC 10/20/2009  . Acute cystitis 08/17/2009  . ALLERGIC RHINITIS 11/21/2007  . Allergy   . ASYMPTOMATIC POSTMENOPAUSAL STATUS 09/29/2008  . Bariatric surgery status 07/07/2010  . Bariatric surgery status 07/07/2010   Qualifier: Diagnosis of  By: Loanne Drilling MD, Jacelyn Pi   . Cancer ([place])    uterine  . Cataract   . Coronary artery disease   . DEPRESSION 11/21/2007  . DIABETES MELLITUS, TYPE II 07/08/2007  . DYSPNEA 05/20/2009  . Edema 05/20/2009  . Eosinophilic esophagitis 06/17/7618  . FEVER UNSPECIFIED 10/20/2009  . FEVER, HX OF 03/09/2010  . GERD (gastroesophageal reflux disease)   . Headache(784.0) 02/06/2010  . Hepatomegaly 01/06/2008  . HYPERLIPIDEMIA 07/08/2007  . HYPERTENSION 07/08/2007  . LEUKOPENIA, MILD 09/29/2008  . OBESITY 01/06/2008  . OSTEOARTHRITIS 01/06/2008  . Other chronic nonalcoholic liver disease 03/20/3266  . PERIPHERAL NEUROPATHY 11/21/2007  . Peripheral neuropathy   . Postsurgical hypothyroidism  09/19/2010  . SINUSITIS- ACUTE-NOS 11/21/2007  . TB SKIN TEST, POSITIVE 02/06/2010  . THYROID NODULE 03/09/2010    Family History  Problem Relation Age of Onset  . Multiple sclerosis Sister   . Breast cancer Sister        breast onset age 63  . Osteoporosis Sister   . Diabetes Mother   . Diabetes Father   . Congestive Heart Failure Father   . Hypertension Father   . Myelodysplastic syndrome Father   . Liver cancer Maternal Aunt   . Breast cancer Maternal Aunt   . Diabetes Maternal Aunt   . Colon cancer Maternal Uncle   . Diabetes Maternal Uncle   . Diabetes Maternal Grandmother   . Diabetes Paternal Grandmother     Past Surgical History:  Procedure Laterality Date  . ABDOMINAL HYSTERECTOMY    . BASAL CELL CARCINOMA EXCISION    . CARDIAC CATHETERIZATION    . COLONOSCOPY    . EYE SURGERY Bilateral 08/2018, 09/2018  . FOOT SURGERY Left    10/2019  . KNEE  ARTHROSCOPY Left   . LEFT HEART CATH AND CORONARY ANGIOGRAPHY N/A 01/20/2019   Procedure: LEFT HEART CATH AND CORONARY ANGIOGRAPHY;  Surgeon: Belva Crome, MD;  Location: [facility];  Service: Cardiovascular;  Laterality: N/A;  . LYMPH NODE DISSECTION    . OOPHORECTOMY    . Right total knee replacement    . ROUX-EN-Y GASTRIC BYPASS  2011  . SKIN TAG REMOVAL    . THYROIDECTOMY    . TONSILLECTOMY AND ADENOIDECTOMY    . UPPER GASTROINTESTINAL ENDOSCOPY     Social History   Occupational History  . Occupation: Optometrist  . Occupation: Retired  Tobacco Use  . Smoking status: Former Smoker    Packs/day: 1.50    Years: 15.00    Pack years: 22.50    Types: Cigarettes    Quit date: 1992    Years since quitting: 30.4  . Smokeless tobacco: Never Used  Vaping Use  . Vaping Use: Never used  Substance and Sexual Activity  . Alcohol use: Yes    Comment: rarely  . Drug use: Never  . Sexual activity: Not Currently

## 2021-04-09 ENCOUNTER — Other Ambulatory Visit: Payer: Self-pay | Admitting: Endocrinology

## 2021-04-09 DIAGNOSIS — E1140 Type 2 diabetes mellitus with diabetic neuropathy, unspecified: Secondary | ICD-10-CM

## 2021-04-10 NOTE — Progress Notes (Signed)
Cardiology Clinic Note   Patient Name: LEILA SCHUFF Date of Encounter: 04/12/2021  Primary Care Provider:  Vivi Barrack, MD Primary Cardiologist:  Elouise Munroe, MD  Patient Profile     Allyce Bochicchio Mcguire 65 year old female presents to the clinic today for follow-up evaluation of her syncope and coronary artery disease.  Past Medical History    Past Medical History:  Diagnosis Date  . ABDOMINAL PAIN, CHRONIC 10/20/2009  . Acute cystitis 08/17/2009  . ALLERGIC RHINITIS 11/21/2007  . Allergy   . ASYMPTOMATIC POSTMENOPAUSAL STATUS 09/29/2008  . Bariatric surgery status 07/07/2010  . Bariatric surgery status 07/07/2010   Qualifier: Diagnosis of  By: Loanne Drilling MD, Jacelyn Pi   . Cancer ([place])    uterine  . Cataract   . Coronary artery disease   . DEPRESSION 11/21/2007  . DIABETES MELLITUS, TYPE II 07/08/2007  . DYSPNEA 05/20/2009  . Edema 05/20/2009  . Eosinophilic esophagitis 12/15/6331  . FEVER UNSPECIFIED 10/20/2009  . FEVER, HX OF 03/09/2010  . GERD (gastroesophageal reflux disease)   . Headache(784.0) 02/06/2010  . Hepatomegaly 01/06/2008  . HYPERLIPIDEMIA 07/08/2007  . HYPERTENSION 07/08/2007  . LEUKOPENIA, MILD 09/29/2008  . OBESITY 01/06/2008  . OSTEOARTHRITIS 01/06/2008  . Other chronic nonalcoholic liver disease 5/45/6256  . PERIPHERAL NEUROPATHY 11/21/2007  . Peripheral neuropathy   . Postsurgical hypothyroidism 09/19/2010  . SINUSITIS- ACUTE-NOS 11/21/2007  . TB SKIN TEST, POSITIVE 02/06/2010  . THYROID NODULE 03/09/2010   Past Surgical History:  Procedure Laterality Date  . ABDOMINAL HYSTERECTOMY    . BASAL CELL CARCINOMA EXCISION    . CARDIAC CATHETERIZATION    . COLONOSCOPY    . EYE SURGERY Bilateral 08/2018, 09/2018  . FOOT SURGERY Left    10/2019  . KNEE ARTHROSCOPY Left   . LEFT HEART CATH AND CORONARY ANGIOGRAPHY N/A 01/20/2019   Procedure: LEFT HEART CATH AND CORONARY ANGIOGRAPHY;  Surgeon: Belva Crome, MD;  Location: [facility];  Service: Cardiovascular;   Laterality: N/A;  . LYMPH NODE DISSECTION    . OOPHORECTOMY    . Right total knee replacement    . ROUX-EN-Y GASTRIC BYPASS  2011  . SKIN TAG REMOVAL    . THYROIDECTOMY    . TONSILLECTOMY AND ADENOIDECTOMY    . UPPER GASTROINTESTINAL ENDOSCOPY      Allergies  Allergies  Allergen Reactions  . Ace Inhibitors Cough  . Caffeine     PVCs  . Ciprofloxacin Itching  . Morphine And Related Itching    Pt tolerates medication if given with diphenhydramine  . Mucinex [Guaifenesin Er]     PVCs  . Nsaids     Gastric Bypass Surgery - unable to take, tolerates ec 81 aspirin   . Other     Antihistamine-alkylamine - PVCs tolerates benadryl   . Silicone Hives    History of Present Illness     RAHCEL SHUTES has a PMH of syncope/collapse, coronary artery disease, chronic diastolic CHF, and type 2 diabetes.  Her PMH also includes esophageal rings with multiple previous dilations, and bariatric surgery in 2011.  She has known significant three-vessel coronary artery disease with no superior lesions involving distal small vascular territories.  She has had associated chest pain and has been medically managed.  She was last seen by Dr.Acharya on 12/23/2020.  At that time she reported a fall and fracture of 4 ribs.  She did not remember the fall.  It was felt to be orthostatic syncope.  Her blood  sugar was evaluated and it was 135.  She presented to the emergency department and her work-up was unremarkable for syncope.  Her blood pressure at the time of her follow-up visit was 94/63.  She was educated on orthostatic hypotension.  She reported she had taken 20 mg of Lasix the night before presenting to the emergency department for swelling in her ankles.  She reported that she did not hydrate after the dose.  It was suspected that she was hypovolemic.  She has severe basal septal hypertrophy noted on her echocardiogram 2019.  A plan was made to repeat her echocardiogram after her episode healed from her fall.   Her repeat echo 04/04/2021 showed normal LVEF and no significant valvular abnormalities.  She presents the clinic today for follow-up evaluation and states she has not had any further episodes of fainting.  She reports that she has been much more aware of when she takes her furosemide.  She does have some left lower extremity swelling.  She reports that she has been wearing lower extremity support stockings until the weather has been warmer.  She has not been wearing them over the last several days.  She does limit her salt intake.  She denies any irregular heartbeats or accelerated heartbeats.  We reviewed her prior lipid panel.  We reviewed her echocardiogram and she expressed understanding.  I will give her the [place] support stocking sheet, salty 6 diet sheet, have her maintain her p.o. hydration, and/or fasting lipids/LFTs.  We will have her follow-up in 6 months.  We will  Today she denies chest pain, shortness of breath, lower extremity edema, fatigue, palpitations, melena, hematuria, hemoptysis, diaphoresis, weakness, presyncope, syncope, orthopnea, and PND.   Home Medications    Prior to Admission medications   Medication Sig Start Date End Date Taking? Authorizing Provider  AMBULATORY NON FORMULARY MEDICATION Administrator at [employer]  Correct leg length and improve pronation BL 10/03/20   Gregor Hams, MD  amoxicillin (AMOXIL) 500 MG capsule as needed. 03/06/20   [provider]  aspirin EC 81 MG tablet Take 1 tablet (81 mg total) by mouth daily. Starting 01/18/19 prior to cardiac cath 01/18/19   Elouise Munroe, MD  atorvastatin (LIPITOR) 40 MG tablet Take 40 mg by mouth every evening.    [provider]  Cholecalciferol (VITAMIN D) 50 MCG (2000 UT) tablet Take 4,000 Units by mouth daily.    [provider]  Colchicine 0.6 MG CAPS TAKE 1 CAPSULE BY MOUTH EVERY DAY 04/03/21   Bo Merino, MD  cyclobenzaprine (FLEXERIL) 10 MG tablet Take 10 mg  by mouth at bedtime as needed for muscle spasms. 07/06/20   [provider]  desloratadine (CLARINEX) 5 MG tablet Take 5 mg by mouth daily.    [provider]  diclofenac sodium (VOLTAREN) 1 % GEL Apply 4 g topically 4 (four) times daily. Patient taking differently: Apply 4 g topically as needed (pain). 09/06/17   Renato Shin, MD  docusate sodium (COLACE) 100 MG capsule Take 200 mg by mouth 2 (two) times daily.    [provider]  Dulaglutide (TRULICITY) 3 TD/9.7CB SOPN Inject 3 mg as directed once a week. 09/05/20   Renato Shin, MD  Elastic Bandages & Supports (RIB BELT/WOMENS MEDIUM) MISC Use as needed for rib pain 12/19/20   Vivi Barrack, MD  Fluconazole (DIFLUCAN PO) Take by mouth as needed.    [provider]  gabapentin (NEURONTIN) 300 MG capsule TAKE 4 CAPSULES  BY MOUTH 3 TIMES DAILY. 03/02/21   Vivi Barrack, MD  halobetasol (ULTRAVATE) 0.05 % cream Apply 1 application topically 2 (two) times daily as needed (lichen sclerosus).  01/26/17   [provider]  INVOKANA 300 MG TABS tablet TAKE 1 TABLET (300 MG TOTAL) BY MOUTH DAILY BEFORE BREAKFAST. Patient taking differently: Take 150 mg by mouth daily. 02/13/21   Renato Shin, MD  levothyroxine (SYNTHROID) 112 MCG tablet TAKE 1 TABLET BY MOUTH EVERY DAY 03/02/21   Renato Shin, MD  lidocaine (LIDODERM) 5 % Place 1 patch onto the skin daily. Remove & Discard patch within 12 hours or as directed by MD 02/13/21   Ofilia Neas, PA-C  MAGNESIUM PO Take 1 tablet by mouth daily as needed (cramps).     [provider]  metFORMIN (GLUCOPHAGE-XR) 500 MG 24 hr tablet TAKE 4 TABLETS BY MOUTH DAILY. 03/02/21   Renato Shin, MD  Multiple Vitamin (MULTIVITAMIN) tablet Take 1 tablet by mouth in the morning and at bedtime.    [provider]  nitroGLYCERIN (NITROSTAT) 0.4 MG SL tablet Place 1 tablet (0.4 mg total) under the tongue every 5 (five) minutes as needed. 09/06/20   Elouise Munroe,  MD  [facility] ULTRA test strip USE TO MONITOR GLUCOSE LEVELS ONCE PER DAY E11.40 04/09/21   Renato Shin, MD  pantoprazole (PROTONIX) 40 MG tablet TAKE 1 TABLET BY MOUTH TWICE A DAY 04/03/21   Vivi Barrack, MD  phenazopyridine (PYRIDIUM) 100 MG tablet Take 100 mg by mouth 3 (three) times daily as needed for pain.    [provider]  Probiotic CAPS Take 1 capsule by mouth daily.    [provider]  repaglinide (PRANDIN) 2 MG tablet Take 1 tablet (2 mg total) by mouth 2 (two) times daily before a meal. 09/05/20   Renato Shin, MD  traMADol (ULTRAM) 50 MG tablet Take 50 mg by mouth 2 (two) times daily as needed for moderate pain.    [provider]  TRULICITY 1.5 OM/7.6HM SOPN INJECT 1.5 MG INTO THE SKIN ONCE A WEEK. 03/22/21   Renato Shin, MD    Family History    Family History  Problem Relation Age of Onset  . Multiple sclerosis Sister   . Breast cancer Sister        breast onset age 66  . Osteoporosis Sister   . Diabetes Mother   . Diabetes Father   . Congestive Heart Failure Father   . Hypertension Father   . Myelodysplastic syndrome Father   . Liver cancer Maternal Aunt   . Breast cancer Maternal Aunt   . Diabetes Maternal Aunt   . Colon cancer Maternal Uncle   . Diabetes Maternal Uncle   . Diabetes Maternal Grandmother   . Diabetes Paternal Grandmother    She indicated that her mother is deceased. She indicated that her father is deceased. She indicated that all of her three sisters are alive. She indicated that her brother is alive. She indicated that the status of her maternal grandmother is unknown. She indicated that the status of her paternal grandmother is unknown. She indicated that the status of her maternal aunt is unknown. She indicated that the status of her maternal uncle is unknown.  Social History    Social History   Socioeconomic History  . Marital status: Single    Spouse name: Not on file  . Number of children: 0  . Years of  education: 16  . Highest education level:  Bachelor's degree (e.g., BA, AB, BS)  Occupational History  . Occupation: Optometrist  . Occupation: Retired  Tobacco Use  . Smoking status: Former Smoker    Packs/day: 1.50    Years: 15.00    Pack years: 22.50    Types: Cigarettes    Quit date: 1992    Years since quitting: 30.4  . Smokeless tobacco: Never Used  Vaping Use  . Vaping Use: Never used  Substance and Sexual Activity  . Alcohol use: Yes    Comment: rarely  . Drug use: Never  . Sexual activity: Not Currently  Other Topics Concern  . Not on file  Social History Narrative  . Not on file   Social Determinants of Health   Financial Resource Strain: Low Risk   . Difficulty of Paying Living Expenses: Not hard at all  Food Insecurity: No Food Insecurity  . Worried About Charity fundraiser in the Last Year: Never true  . Ran Out of Food in the Last Year: Never true  Transportation Needs: Unknown  . Lack of Transportation (Medical): Not on file  . Lack of Transportation (Non-Medical): No  Physical Activity: Sufficiently Active  . Days of Exercise per Week: 5 days  . Minutes of Exercise per Session: 60 min  Stress: No Stress Concern Present  . Feeling of Stress : Not at all  Social Connections: Moderately Integrated  . Frequency of Communication with Friends and Family: More than three times a week  . Frequency of Social Gatherings with Friends and Family: More than three times a week  . Attends Religious Services: 1 to 4 times per year  . Active Member of Clubs or Organizations: Yes  . Attends Archivist Meetings: 1 to 4 times per year  . Marital Status: Never married  Intimate Partner Violence: Not At Risk  . Fear of Current or Ex-Partner: No  . Emotionally Abused: No  . Physically Abused: No  . Sexually Abused: No     Review of Systems    General:  No chills, fever, night sweats or weight changes.  Cardiovascular:  No chest pain, dyspnea on exertion,  edema, orthopnea, palpitations, paroxysmal nocturnal dyspnea. Dermatological: No rash, lesions/masses Respiratory: No cough, dyspnea Urologic: No hematuria, dysuria Abdominal:   No nausea, vomiting, diarrhea, bright red blood per rectum, melena, or hematemesis Neurologic:  No visual changes, wkns, changes in mental status. All other systems reviewed and are otherwise negative except as noted above.  Physical Exam    VS:  BP 116/72   Pulse 80   Ht 5' 11"  (1.803 m)   Wt 168 lb 3.2 oz (76.3 kg)   SpO2 99%   BMI 23.46 kg/m  , BMI Body mass index is 23.46 kg/m. GEN: Well nourished, well developed, in no acute distress. HEENT: normal. Neck: Supple, no JVD, carotid bruits, or masses. Cardiac: RRR, no murmurs, rubs, or gallops. No clubbing, cyanosis, left lower extremity +1 pitting edema.  Radials/DP/PT 2+ and equal bilaterally.  Respiratory:  Respirations regular and unlabored, clear to auscultation bilaterally. GI: Soft, nontender, nondistended, BS + x 4. MS: no deformity or atrophy. Skin: warm and dry, no rash. Neuro:  Strength and sensation are intact. Psych: Normal affect.  Accessory Clinical Findings    Recent Labs: 12/16/2020: ALT 51; BUN 25; Creatinine, Ser 0.97; Potassium 4.1; Sodium 140 01/31/2021: Hemoglobin 12.2; Platelet Count 168   Recent Lipid Panel    Component Value Date/Time   CHOL 136 04/29/2019 1336   TRIG  102 04/29/2019 1336   HDL 47 04/29/2019 1336   CHOLHDL 2.9 04/29/2019 1336   CHOLHDL 4 07/04/2018 1403   VLDL 24.6 07/04/2018 1403   LDLCALC 69 04/29/2019 1336   LDLDIRECT 99.0 10/12/2011 1219    ECG personally reviewed by me today-none today.  Echocardiogram 04/04/2021   IMPRESSIONS    1. Left ventricular ejection fraction, by estimation, is 60 to 65%. The  left ventricle has normal function. The left ventricle has no regional  wall motion abnormalities. Left ventricular diastolic parameters were  normal. The average left ventricular  global  longitudinal strain is -19.9 %. The global longitudinal strain is  normal.  2. Right ventricular systolic function is normal. The right ventricular  size is normal. There is normal pulmonary artery systolic pressure. The  estimated right ventricular systolic pressure is 18.9 mmHg.  3. The mitral valve is grossly normal. Mild mitral valve regurgitation.  No evidence of mitral stenosis.  4. The aortic valve is tricuspid. Aortic valve regurgitation is not  visualized. No aortic stenosis is present.  5. The inferior vena cava is normal in size with greater than 50%  respiratory variability, suggesting right atrial pressure of 3 mmHg.  Assessment & Plan   1.  Syncope and collapse- no further episodes of presyncope or syncope.  Follow-up echocardiogram showed normal LVEF and no  valvular abnormalities. Continue p.o. hydration Heart healthy low-sodium diet Increase physical activity as tolerated Lower extremity support stockings-[place] support stocking sheet given  Coronary artery disease-no chest pain today.  Has known significant three-vessel CAD Continue aspirin, and atorvastatin, nitroglycerin Heart healthy low-sodium diet-salty 6 given Increase physical activity as tolerated  Chronic diastolic CHF- no increased DOE or activity intolerance.  Left lower extremity +1 pitting edema.  Weight stable. Continue aspirin Heart healthy low-sodium diet-salty 6 given Increase physical activity as tolerated Lower extremity support stockings Elevate lower extremities when not active  Hyperlipidemia-LDL 69 on 04/29/19 Continue atorvastatin Heart healthy low-sodium high-fiber diet Order Lipids and lfts  Type 2 diabetes- follows with PCP Continue medical therapy  Disposition: Follow-up with Dr.Acharya in 4-6 months.   Jossie Ng. Alexsia Klindt NP-C    04/12/2021, 10:15 AM [place] [address] Office [phone] Fax [phone]  Notice: This  dictation was prepared with Dragon dictation along with smaller phrase technology. Any transcriptional errors that result from this process are unintentional and may not be corrected upon review.  I spent 15 minutes examining this patient, reviewing medications, and using patient centered shared decision making involving her cardiac care.  Prior to her visit I spent greater than 20 minutes reviewing her past medical history,  medications, and prior cardiac tests.

## 2021-04-12 ENCOUNTER — Ambulatory Visit (INDEPENDENT_AMBULATORY_CARE_PROVIDER_SITE_OTHER): Payer: Medicare Other | Admitting: General Practice

## 2021-04-12 ENCOUNTER — Encounter: Payer: Self-pay | Admitting: General Practice

## 2021-04-12 ENCOUNTER — Other Ambulatory Visit: Payer: Self-pay

## 2021-04-12 VITALS — BP 116/72 | HR 80 | Ht 71.0 in | Wt 168.2 lb

## 2021-04-12 DIAGNOSIS — E1140 Type 2 diabetes mellitus with diabetic neuropathy, unspecified: Secondary | ICD-10-CM

## 2021-04-12 DIAGNOSIS — I2510 Atherosclerotic heart disease of native coronary artery without angina pectoris: Secondary | ICD-10-CM

## 2021-04-12 DIAGNOSIS — I5032 Chronic diastolic (congestive) heart failure: Secondary | ICD-10-CM

## 2021-04-12 DIAGNOSIS — E1169 Type 2 diabetes mellitus with other specified complication: Secondary | ICD-10-CM | POA: Diagnosis not present

## 2021-04-12 DIAGNOSIS — E785 Hyperlipidemia, unspecified: Secondary | ICD-10-CM | POA: Diagnosis not present

## 2021-04-12 DIAGNOSIS — R55 Syncope and collapse: Secondary | ICD-10-CM | POA: Diagnosis not present

## 2021-04-12 NOTE — Patient Instructions (Addendum)
Medication Instructions:  Your physician recommends that you continue on your current medications as directed. Please refer to the Current Medication list given to you today.  *If you need a refill on your cardiac medications before your next appointment, please call your pharmacy*   Lab Work: In the next two weeks, please return to have FASTING lipid panel and hepatic panel drawn.   Testing/Procedures: None ordered.   Follow-Up: At [facility], you and your health needs are our priority.  As part of our continuing mission to provide you with exceptional heart care, we have created designated Provider Care Teams.  These Care Teams include your primary Cardiologist (physician) and Advanced Practice Providers (APPs -  Physician Assistants and Nurse Practitioners) who all work together to provide you with the care you need, when you need it.  We recommend signing up for the patient portal called "MyChart".  Sign up information is provided on this After Visit Summary.  MyChart is used to connect with patients for Virtual Visits (Telemedicine).  Patients are able to view lab/test results, encounter notes, upcoming appointments, etc.  Non-urgent messages can be sent to your provider as well.   To learn more about what you can do with MyChart, go to [email or web address].    Your next appointment:   4 month(s)  The format for your next appointment:   In Person  Provider:   Cherlynn Kaiser, MD   Other Instructions Please review "salty six" information provided Stay well hydrated Please see information provided about getting compression stockings.

## 2021-04-13 ENCOUNTER — Other Ambulatory Visit (HOSPITAL_COMMUNITY): Payer: Medicare Other

## 2021-04-18 ENCOUNTER — Ambulatory Visit (INDEPENDENT_AMBULATORY_CARE_PROVIDER_SITE_OTHER): Payer: Medicare Other

## 2021-04-18 ENCOUNTER — Ambulatory Visit (INDEPENDENT_AMBULATORY_CARE_PROVIDER_SITE_OTHER): Payer: Medicare Other | Admitting: Orthopaedic Surgery

## 2021-04-18 ENCOUNTER — Encounter: Payer: Self-pay | Admitting: Orthopaedic Surgery

## 2021-04-18 DIAGNOSIS — I2510 Atherosclerotic heart disease of native coronary artery without angina pectoris: Secondary | ICD-10-CM | POA: Diagnosis not present

## 2021-04-18 DIAGNOSIS — M25531 Pain in right wrist: Secondary | ICD-10-CM

## 2021-04-18 DIAGNOSIS — M545 Low back pain, unspecified: Secondary | ICD-10-CM

## 2021-04-18 DIAGNOSIS — G5602 Carpal tunnel syndrome, left upper limb: Secondary | ICD-10-CM | POA: Diagnosis not present

## 2021-04-18 DIAGNOSIS — G5601 Carpal tunnel syndrome, right upper limb: Secondary | ICD-10-CM

## 2021-04-18 DIAGNOSIS — G629 Polyneuropathy, unspecified: Secondary | ICD-10-CM | POA: Diagnosis not present

## 2021-04-18 NOTE — Addendum Note (Signed)
Addended by: Precious Bard on: 04/18/2021 03:21 PM   Modules accepted: Orders

## 2021-04-18 NOTE — Progress Notes (Addendum)
Office Visit Note   Patient: Savannah [name]           Date of Birth: 1956-03-07           MRN: [id number] Visit Date: 04/18/2021              Requested by: Vivi Barrack, MD [address] [place],  [place] [address] PCP: Vivi Barrack, MD   Assessment & Plan: Visit Diagnoses:  1. Pain in right wrist   2. Low back pain, unspecified back pain laterality, unspecified chronicity, unspecified whether sciatica present   3. Neuropathy   4. Right carpal tunnel syndrome   5. Left carpal tunnel syndrome     Plan: For the right wrist I think she exacerbated her underlying DRUJ posttraumatic arthrosis.  I recommend continuing to wear the wrist brace for support entry symptomatically.  For the low back pain I doubt she has a L4 compression fracture I feel that it is more consistent with a lumbar strain.  I have recommended lumbar corset for support and wean as tolerated based on improvement of symptoms.  She did request a referral to a neurologist locally for peripheral neuropathy.  I will see her back as needed. NCS show moderate bilateral carpal tunnel syndrome.  Follow-Up Instructions: Return if symptoms worsen or fail to improve.   Orders:  Orders Placed This Encounter  Procedures  . XR Wrist Complete Right  . XR Lumbar Spine 2-3 Views  . Ambulatory referral to Neurology   No orders of the defined types were placed in this encounter.     Procedures: No procedures performed   Clinical Data: No additional findings.   Subjective: Chief Complaint  Patient presents with  . Right Wrist - Pain  . Lower Back - Pain    Savannah [name] comes in for evaluation of right wrist and low back pain status post mechanical fall last week in which she fell directly on her low back.  In the pain from her low back does not radiate into the legs.  She has had difficulty sleeping due to the pain.  She is also got wrist pain.  Since the fall.  The left carpal tunnel injection helped with her symptoms but  she has not felt much relief from the right injection.   Review of Systems  Constitutional: Negative.   HENT: Negative.   Eyes: Negative.   Respiratory: Negative.   Cardiovascular: Negative.   Endocrine: Negative.   Musculoskeletal: Negative.   Neurological: Negative.   Hematological: Negative.   Psychiatric/Behavioral: Negative.   All other systems reviewed and are negative.    Objective: Vital Signs: There were no vitals taken for this visit.  Physical Exam Vitals and nursing note reviewed.  Constitutional:      Appearance: She is well-developed.  Pulmonary:     Effort: Pulmonary effort is normal.  Skin:    General: Skin is warm.     Capillary Refill: Capillary refill takes less than 2 seconds.  Neurological:     Mental Status: She is alert and oriented to person, place, and time.  Psychiatric:        Behavior: Behavior normal.        Thought Content: Thought content normal.        Judgment: Judgment normal.     Ortho Exam Right wrist shows no bony crepitus.  Range of motion is supple with mild discomfort.  DRUJ is stable to exam. Low back exam shows no tenderness to  palpation to the spinous processes.  Slightly decreased range of motion secondary to pain and guarding.  No focal deficits of the lower extremities. Specialty Comments:  No specialty comments available.  Imaging: XR Wrist Complete Right  Result Date: 04/18/2021 Chroni DRUJ subluxation with posttraumatic DRUJ arthrosis.  XR Lumbar Spine 2-3 Views  Result Date: 04/18/2021 L4 superior endplate changes questionable compression fracture versus Schmorl's nodes.    PMFS History: Patient Active Problem List   Diagnosis Date Noted  . Osteopenia 01/14/2021  . Cholelithiasis 01/02/2021  . Sesamoiditis of left foot 11/09/2020  . Posterior tibial tendinitis of left lower extremity 11/09/2020  . Type 2 diabetes mellitus with diabetic peripheral angiopathy without gangrene, with long-term current use of  insulin ([place]) 10/21/2020  . Iron deficiency anemia 03/14/2020  . Deficiency anemia 02/29/2020  . Lesion of vulva 09/02/2019  . Menorrhagia 09/02/2019  . Neuropathy 09/02/2019  . Abdominal pain, epigastric 06/25/2019  . Irregular bowel habits 06/25/2019  . Coronary artery disease involving native coronary artery of native heart without angina pectoris   . Temporomandibular joint disorder 12/05/2018  . Fatigue 12/05/2018  . Inverted nipple 12/05/2018  . Senile purpura ([place]) 11/21/2018  . GERD with esophagitis 11/21/2018  . Pseudogout involving multiple joints 11/21/2018  . Lipoma 07/22/2017  . Lichen sclerosus 10/62/6948  . H/O gastric bypass 11/16/2016  . GERD (gastroesophageal reflux disease) 11/16/2016  . Depression 11/16/2016  . Diabetic polyneuropathy associated with type 2 diabetes mellitus ([place]) 11/16/2016  . Hyperlipidemia 11/16/2016  . Left lower quadrant pain 11/16/2016  . Uterine cancer [facility]) s/p hysterectomy 1997 01/04/2012  . Postsurgical hypothyroidism 09/19/2010  . Generalized abdominal pain 10/20/2009  . Diabetic neuropathy ([place]) 11/21/2007  . Allergic rhinitis 11/21/2007  . Type 2 diabetes mellitus with diabetic neuropathy, unspecified ([place]) 07/08/2007  . Dyslipidemia associated with type 2 diabetes mellitus ([place]) 07/08/2007   Past Medical History:  Diagnosis Date  . ABDOMINAL PAIN, CHRONIC 10/20/2009  . Acute cystitis 08/17/2009  . ALLERGIC RHINITIS 11/21/2007  . Allergy   . ASYMPTOMATIC POSTMENOPAUSAL STATUS 09/29/2008  . Bariatric surgery status 07/07/2010  . Bariatric surgery status 07/07/2010   Qualifier: Diagnosis of  By: Loanne Drilling MD, Jacelyn Pi   . Cancer ([place])    uterine  . Cataract   . Coronary artery disease   . DEPRESSION 11/21/2007  . DIABETES MELLITUS, TYPE II 07/08/2007  . DYSPNEA 05/20/2009  . Edema 05/20/2009  . Eosinophilic esophagitis 03/15/6269  . FEVER UNSPECIFIED 10/20/2009  . FEVER, HX OF 03/09/2010  . GERD (gastroesophageal reflux disease)   .  Headache(784.0) 02/06/2010  . Hepatomegaly 01/06/2008  . HYPERLIPIDEMIA 07/08/2007  . HYPERTENSION 07/08/2007  . LEUKOPENIA, MILD 09/29/2008  . OBESITY 01/06/2008  . OSTEOARTHRITIS 01/06/2008  . Other chronic nonalcoholic liver disease 3/50/0938  . PERIPHERAL NEUROPATHY 11/21/2007  . Peripheral neuropathy   . Postsurgical hypothyroidism 09/19/2010  . SINUSITIS- ACUTE-NOS 11/21/2007  . TB SKIN TEST, POSITIVE 02/06/2010  . THYROID NODULE 03/09/2010    Family History  Problem Relation Age of Onset  . Multiple sclerosis Sister   . Breast cancer Sister        breast onset age 66  . Osteoporosis Sister   . Diabetes Mother   . Diabetes Father   . Congestive Heart Failure Father   . Hypertension Father   . Myelodysplastic syndrome Father   . Liver cancer Maternal Aunt   . Breast cancer Maternal Aunt   . Diabetes Maternal Aunt   . Colon cancer Maternal Uncle   .  Diabetes Maternal Uncle   . Diabetes Maternal Grandmother   . Diabetes Paternal Grandmother     Past Surgical History:  Procedure Laterality Date  . ABDOMINAL HYSTERECTOMY    . BASAL CELL CARCINOMA EXCISION    . CARDIAC CATHETERIZATION    . COLONOSCOPY    . EYE SURGERY Bilateral 08/2018, 09/2018  . FOOT SURGERY Left    10/2019  . KNEE ARTHROSCOPY Left   . LEFT HEART CATH AND CORONARY ANGIOGRAPHY N/A 01/20/2019   Procedure: LEFT HEART CATH AND CORONARY ANGIOGRAPHY;  Surgeon: Belva Crome, MD;  Location: [facility];  Service: Cardiovascular;  Laterality: N/A;  . LYMPH NODE DISSECTION    . OOPHORECTOMY    . Right total knee replacement    . ROUX-EN-Y GASTRIC BYPASS  2011  . SKIN TAG REMOVAL    . THYROIDECTOMY    . TONSILLECTOMY AND ADENOIDECTOMY    . UPPER GASTROINTESTINAL ENDOSCOPY     Social History   Occupational History  . Occupation: Optometrist  . Occupation: Retired  Tobacco Use  . Smoking status: Former Smoker    Packs/day: 1.50    Years: 15.00    Pack years: 22.50    Types: Cigarettes    Quit date:  1992    Years since quitting: 30.4  . Smokeless tobacco: Never Used  Vaping Use  . Vaping Use: Never used  Substance and Sexual Activity  . Alcohol use: Yes    Comment: rarely  . Drug use: Never  . Sexual activity: Not Currently

## 2021-05-09 ENCOUNTER — Ambulatory Visit: Payer: Medicare Other | Attending: Critical Care Medicine

## 2021-05-09 ENCOUNTER — Emergency Department (HOSPITAL_BASED_OUTPATIENT_CLINIC_OR_DEPARTMENT_OTHER): Payer: Medicare Other

## 2021-05-09 ENCOUNTER — Encounter (HOSPITAL_BASED_OUTPATIENT_CLINIC_OR_DEPARTMENT_OTHER): Payer: Self-pay | Admitting: Radiology

## 2021-05-09 ENCOUNTER — Inpatient Hospital Stay (HOSPITAL_BASED_OUTPATIENT_CLINIC_OR_DEPARTMENT_OTHER)
Admission: EM | Admit: 2021-05-09 | Discharge: 2021-05-13 | DRG: 418 | Disposition: A | Discharge status: Home / Self Care | Payer: Medicare Other | Attending: Internal Medicine | Admitting: Internal Medicine

## 2021-05-09 ENCOUNTER — Telehealth (INDEPENDENT_AMBULATORY_CARE_PROVIDER_SITE_OTHER): Payer: Medicare Other | Admitting: Family Medicine

## 2021-05-09 ENCOUNTER — Other Ambulatory Visit: Payer: Self-pay

## 2021-05-09 DIAGNOSIS — Z800 Family history of malignant neoplasm of digestive organs: Secondary | ICD-10-CM

## 2021-05-09 DIAGNOSIS — R509 Fever, unspecified: Secondary | ICD-10-CM

## 2021-05-09 DIAGNOSIS — R9431 Abnormal electrocardiogram [ECG] [EKG]: Secondary | ICD-10-CM | POA: Diagnosis not present

## 2021-05-09 DIAGNOSIS — R1013 Epigastric pain: Secondary | ICD-10-CM | POA: Diagnosis not present

## 2021-05-09 DIAGNOSIS — K8020 Calculus of gallbladder without cholecystitis without obstruction: Secondary | ICD-10-CM | POA: Diagnosis present

## 2021-05-09 DIAGNOSIS — K8062 Calculus of gallbladder and bile duct with acute cholecystitis without obstruction: Secondary | ICD-10-CM | POA: Diagnosis not present

## 2021-05-09 DIAGNOSIS — E1140 Type 2 diabetes mellitus with diabetic neuropathy, unspecified: Secondary | ICD-10-CM | POA: Diagnosis not present

## 2021-05-09 DIAGNOSIS — Z8249 Family history of ischemic heart disease and other diseases of the circulatory system: Secondary | ICD-10-CM

## 2021-05-09 DIAGNOSIS — E785 Hyperlipidemia, unspecified: Secondary | ICD-10-CM | POA: Diagnosis present

## 2021-05-09 DIAGNOSIS — Z85828 Personal history of other malignant neoplasm of skin: Secondary | ICD-10-CM

## 2021-05-09 DIAGNOSIS — R079 Chest pain, unspecified: Secondary | ICD-10-CM | POA: Diagnosis not present

## 2021-05-09 DIAGNOSIS — E1151 Type 2 diabetes mellitus with diabetic peripheral angiopathy without gangrene: Secondary | ICD-10-CM | POA: Diagnosis present

## 2021-05-09 DIAGNOSIS — K829 Disease of gallbladder, unspecified: Secondary | ICD-10-CM

## 2021-05-09 DIAGNOSIS — F339 Major depressive disorder, recurrent, unspecified: Secondary | ICD-10-CM | POA: Diagnosis present

## 2021-05-09 DIAGNOSIS — R52 Pain, unspecified: Secondary | ICD-10-CM

## 2021-05-09 DIAGNOSIS — Z79890 Hormone replacement therapy (postmenopausal): Secondary | ICD-10-CM

## 2021-05-09 DIAGNOSIS — I10 Essential (primary) hypertension: Secondary | ICD-10-CM | POA: Diagnosis present

## 2021-05-09 DIAGNOSIS — K219 Gastro-esophageal reflux disease without esophagitis: Secondary | ICD-10-CM | POA: Diagnosis present

## 2021-05-09 DIAGNOSIS — Z885 Allergy status to narcotic agent status: Secondary | ICD-10-CM

## 2021-05-09 DIAGNOSIS — Z820 Family history of epilepsy and other diseases of the nervous system: Secondary | ICD-10-CM

## 2021-05-09 DIAGNOSIS — F32A Depression, unspecified: Secondary | ICD-10-CM | POA: Diagnosis present

## 2021-05-09 DIAGNOSIS — Z20822 Contact with and (suspected) exposure to covid-19: Secondary | ICD-10-CM

## 2021-05-09 DIAGNOSIS — Z888 Allergy status to other drugs, medicaments and biological substances status: Secondary | ICD-10-CM

## 2021-05-09 DIAGNOSIS — I2510 Atherosclerotic heart disease of native coronary artery without angina pectoris: Secondary | ICD-10-CM | POA: Diagnosis present

## 2021-05-09 DIAGNOSIS — Z803 Family history of malignant neoplasm of breast: Secondary | ICD-10-CM

## 2021-05-09 DIAGNOSIS — Z9884 Bariatric surgery status: Secondary | ICD-10-CM

## 2021-05-09 DIAGNOSIS — Z7982 Long term (current) use of aspirin: Secondary | ICD-10-CM

## 2021-05-09 DIAGNOSIS — Z886 Allergy status to analgesic agent status: Secondary | ICD-10-CM

## 2021-05-09 DIAGNOSIS — Z881 Allergy status to other antibiotic agents status: Secondary | ICD-10-CM

## 2021-05-09 DIAGNOSIS — M8580 Other specified disorders of bone density and structure, unspecified site: Secondary | ICD-10-CM | POA: Diagnosis present

## 2021-05-09 DIAGNOSIS — W1830XA Fall on same level, unspecified, initial encounter: Secondary | ICD-10-CM | POA: Diagnosis present

## 2021-05-09 DIAGNOSIS — E11649 Type 2 diabetes mellitus with hypoglycemia without coma: Secondary | ICD-10-CM | POA: Diagnosis not present

## 2021-05-09 DIAGNOSIS — Z8262 Family history of osteoporosis: Secondary | ICD-10-CM

## 2021-05-09 DIAGNOSIS — R0600 Dyspnea, unspecified: Secondary | ICD-10-CM | POA: Diagnosis not present

## 2021-05-09 DIAGNOSIS — Z8616 Personal history of COVID-19: Secondary | ICD-10-CM

## 2021-05-09 DIAGNOSIS — K759 Inflammatory liver disease, unspecified: Secondary | ICD-10-CM

## 2021-05-09 DIAGNOSIS — R10821 Right upper quadrant rebound abdominal tenderness: Secondary | ICD-10-CM | POA: Diagnosis not present

## 2021-05-09 DIAGNOSIS — E109 Type 1 diabetes mellitus without complications: Secondary | ICD-10-CM

## 2021-05-09 DIAGNOSIS — Z79899 Other long term (current) drug therapy: Secondary | ICD-10-CM

## 2021-05-09 DIAGNOSIS — S32049A Unspecified fracture of fourth lumbar vertebra, initial encounter for closed fracture: Secondary | ICD-10-CM | POA: Diagnosis present

## 2021-05-09 DIAGNOSIS — R109 Unspecified abdominal pain: Secondary | ICD-10-CM | POA: Diagnosis not present

## 2021-05-09 DIAGNOSIS — Z8542 Personal history of malignant neoplasm of other parts of uterus: Secondary | ICD-10-CM

## 2021-05-09 DIAGNOSIS — Z833 Family history of diabetes mellitus: Secondary | ICD-10-CM

## 2021-05-09 DIAGNOSIS — Z87891 Personal history of nicotine dependence: Secondary | ICD-10-CM

## 2021-05-09 DIAGNOSIS — Z96651 Presence of right artificial knee joint: Secondary | ICD-10-CM | POA: Diagnosis present

## 2021-05-09 DIAGNOSIS — E890 Postprocedural hypothyroidism: Secondary | ICD-10-CM | POA: Diagnosis present

## 2021-05-09 DIAGNOSIS — Z7984 Long term (current) use of oral hypoglycemic drugs: Secondary | ICD-10-CM

## 2021-05-09 DIAGNOSIS — M1990 Unspecified osteoarthritis, unspecified site: Secondary | ICD-10-CM | POA: Diagnosis present

## 2021-05-09 LAB — CBC WITH DIFFERENTIAL/PLATELET
Abs Immature Granulocytes: 0.01 10*3/uL (ref 0.00–0.07)
Basophils Absolute: 0 10*3/uL (ref 0.0–0.1)
Basophils Relative: 0 %
Eosinophils Absolute: 0.1 10*3/uL (ref 0.0–0.5)
Eosinophils Relative: 4 %
HCT: 40.7 % (ref 36.0–46.0)
Hemoglobin: 12.9 g/dL (ref 12.0–15.0)
Immature Granulocytes: 0 %
Lymphocytes Relative: 15 %
Lymphs Abs: 0.4 10*3/uL — ABNORMAL LOW (ref 0.7–4.0)
MCH: 31.7 pg (ref 26.0–34.0)
MCHC: 31.7 g/dL (ref 30.0–36.0)
MCV: 100 fL (ref 80.0–100.0)
Monocytes Absolute: 0.2 10*3/uL (ref 0.1–1.0)
Monocytes Relative: 9 %
Neutro Abs: 1.9 10*3/uL (ref 1.7–7.7)
Neutrophils Relative %: 72 %
Platelets: 140 10*3/uL — ABNORMAL LOW (ref 150–400)
RBC: 4.07 MIL/uL (ref 3.87–5.11)
RDW: 13.7 % (ref 11.5–15.5)
WBC: 2.6 10*3/uL — ABNORMAL LOW (ref 4.0–10.5)
nRBC: 0 % (ref 0.0–0.2)

## 2021-05-09 LAB — URINALYSIS, ROUTINE W REFLEX MICROSCOPIC
Bilirubin Urine: NEGATIVE
Glucose, UA: >1000 mg/dL — AB
Hgb urine dipstick: NEGATIVE
Leukocytes,Ua: NEGATIVE
Nitrite: NEGATIVE
Protein, ur: 30 mg/dL — AB
Specific Gravity, Urine: 1.03 (ref 1.005–1.030)
pH: 5.5 (ref 5.0–8.0)

## 2021-05-09 LAB — CBC
HCT: 40.4 % (ref 36.0–46.0)
Hemoglobin: 12.9 g/dL (ref 12.0–15.0)
MCH: 31.7 pg (ref 26.0–34.0)
MCHC: 31.9 g/dL (ref 30.0–36.0)
MCV: 99.3 fL (ref 80.0–100.0)
Platelets: 134 10*3/uL — ABNORMAL LOW (ref 150–400)
RBC: 4.07 MIL/uL (ref 3.87–5.11)
RDW: 13.6 % (ref 11.5–15.5)
WBC: 2.7 10*3/uL — ABNORMAL LOW (ref 4.0–10.5)
nRBC: 0 % (ref 0.0–0.2)

## 2021-05-09 LAB — LACTIC ACID, PLASMA: Lactic Acid, Venous: 0.9 mmol/L (ref 0.5–1.9)

## 2021-05-09 LAB — COMPREHENSIVE METABOLIC PANEL
ALT: 1506 U/L — ABNORMAL HIGH (ref 0–44)
AST: 788 U/L — ABNORMAL HIGH (ref 15–41)
Albumin: 3.8 g/dL (ref 3.5–5.0)
Alkaline Phosphatase: 263 U/L — ABNORMAL HIGH (ref 38–126)
Anion gap: 9 (ref 5–15)
BUN: 22 mg/dL (ref 8–23)
CO2: 27 mmol/L (ref 22–32)
Calcium: 9.6 mg/dL (ref 8.9–10.3)
Chloride: 104 mmol/L (ref 98–111)
Creatinine, Ser: 1.11 mg/dL — ABNORMAL HIGH (ref 0.44–1.00)
GFR, Estimated: 54 mL/min — ABNORMAL LOW (ref >60)
Glucose, Bld: 120 mg/dL — ABNORMAL HIGH (ref 70–99)
Potassium: 4.3 mmol/L (ref 3.5–5.1)
Sodium: 140 mmol/L (ref 135–145)
Total Bilirubin: 1.1 mg/dL (ref 0.3–1.2)
Total Protein: 6 g/dL — ABNORMAL LOW (ref 6.5–8.1)

## 2021-05-09 LAB — LIPASE, BLOOD: Lipase: 31 U/L (ref 11–51)

## 2021-05-09 LAB — CBG MONITORING, ED: Glucose-Capillary: 95 mg/dL (ref 70–99)

## 2021-05-09 MED ORDER — IOHEXOL 300 MG/ML  SOLN
100.0000 mL | Freq: Once | INTRAMUSCULAR | Status: AC | PRN
  Administered 2021-05-09: 100 mL via INTRAVENOUS

## 2021-05-09 MED ORDER — SODIUM CHLORIDE 0.9 % IV SOLN: INTRAVENOUS | Status: DC

## 2021-05-09 MED ORDER — PIPERACILLIN-TAZOBACTAM 3.375 G IVPB 30 MIN
3.3750 g | Freq: Once | INTRAVENOUS | Status: AC
  Administered 2021-05-09: 3.375 g via INTRAVENOUS
  Filled 2021-05-09: qty 50

## 2021-05-09 NOTE — Patient Instructions (Addendum)
Please seek in person care as we discussed.   I hope you are feeling better soon!  It was nice to see you today. I help [facility] out with telemedicine visits on Tuesdays and Thursdays and am available for visits on those days. If you have any concerns or questions following this visit please schedule a follow up visit with your Primary Care doctor or seek care at a local urgent care clinic to avoid delays in care.

## 2021-05-09 NOTE — ED Triage Notes (Signed)
Patient reports to the ER for abdominal pain, fever, soft stools, chills. Patient reports she has sludge in her gallbladder and she has a hx of lymph node removal in her abdominal cavity.

## 2021-05-09 NOTE — Progress Notes (Signed)
Virtual Visit via Video Note  I connected with Savannah [name]  on 05/09/21 at  6:00 PM EDT by a video enabled telemedicine application and verified that I am speaking with the correct person using two identifiers.  Location patient: home, [place] Location provider:work or home office Persons participating in the virtual visit: patient, provider  I discussed the limitations of evaluation and management by telemedicine and the availability of in person appointments. The patient expressed understanding and agreed to proceed.   HPI:  Acute telemedicine visit for : -Onset: 2 days ago -Symptoms include: started with severe abd pain in the epigastric region, then developed chills, body aches everywhere, nausea, Ha, poor appetite, fever up to high of 101.6, loose stools -feels a little better today but still tired and has abd pain, TTP in RUQ, poor appetite -Denies:nasal congestion, cough, sore throat, sneezing, inability to to eat/drink/get out of bed, sick contacts, diarrhea, vomiting -negative rapid covid test -Pertinent past medical history: had covid a little over a month ago, hx GB sludge -Pertinent medication allergies: Allergies  Allergen Reactions   Ace Inhibitors Cough   Caffeine     PVCs   Ciprofloxacin Itching   Morphine And Related Itching    Pt tolerates medication if given with diphenhydramine   Mucinex [Guaifenesin Er]     PVCs   Nsaids     Gastric Bypass Surgery - unable to take, tolerates ec 81 aspirin    Other     Antihistamine-alkylamine - PVCs tolerates benadryl    Silicone Hives  -[id number] vaccine status: vaccinated and boosted  ROS: See pertinent positives and negatives per HPI.  Past Medical History:  Diagnosis Date   ABDOMINAL PAIN, CHRONIC 10/20/2009   Acute cystitis 08/17/2009   ALLERGIC RHINITIS 11/21/2007   Allergy    ASYMPTOMATIC POSTMENOPAUSAL STATUS 09/29/2008   Bariatric surgery status 07/07/2010   Bariatric surgery status 07/07/2010   Qualifier: Diagnosis of  By:  Loanne Drilling MD, Sean A    Cancer [facility])    uterine   Cataract    Coronary artery disease    DEPRESSION 11/21/2007   DIABETES MELLITUS, TYPE II 07/08/2007   DYSPNEA 05/20/2009   Edema 05/19/6766   Eosinophilic esophagitis 2/0/9470   FEVER UNSPECIFIED 10/20/2009   FEVER, HX OF 03/09/2010   GERD (gastroesophageal reflux disease)    Headache(784.0) 02/06/2010   Hepatomegaly 01/06/2008   HYPERLIPIDEMIA 07/08/2007   HYPERTENSION 07/08/2007   LEUKOPENIA, MILD 09/29/2008   OBESITY 01/06/2008   OSTEOARTHRITIS 01/06/2008   Other chronic nonalcoholic liver disease 9/62/8366   PERIPHERAL NEUROPATHY 11/21/2007   Peripheral neuropathy    Postsurgical hypothyroidism 09/19/2010   SINUSITIS- ACUTE-NOS 11/21/2007   TB SKIN TEST, POSITIVE 02/06/2010   THYROID NODULE 03/09/2010    Past Surgical History:  Procedure Laterality Date   ABDOMINAL HYSTERECTOMY     BASAL CELL CARCINOMA EXCISION     CARDIAC CATHETERIZATION     COLONOSCOPY     EYE SURGERY Bilateral 08/2018, 09/2018   FOOT SURGERY Left    10/2019   KNEE ARTHROSCOPY Left    LEFT HEART CATH AND CORONARY ANGIOGRAPHY N/A 01/20/2019   Procedure: LEFT HEART CATH AND CORONARY ANGIOGRAPHY;  Surgeon: Belva Crome, MD;  Location: [place] [facility];  Service: Cardiovascular;  Laterality: N/A;   LYMPH NODE DISSECTION     OOPHORECTOMY     Right total knee replacement     ROUX-EN-Y GASTRIC BYPASS  2011   SKIN TAG REMOVAL     THYROIDECTOMY  TONSILLECTOMY AND ADENOIDECTOMY     UPPER GASTROINTESTINAL ENDOSCOPY       Current Outpatient Medications:    AMBULATORY NON FORMULARY MEDICATION, Administrator at [employer]  Correct leg length and improve pronation BL, Disp: 1 each, Rfl: 0   amoxicillin (AMOXIL) 500 MG capsule, as needed., Disp: , Rfl:    aspirin EC 81 MG tablet, Take 1 tablet (81 mg total) by mouth daily. Starting 01/18/19 prior to cardiac cath, Disp: , Rfl:    atorvastatin (LIPITOR) 40 MG tablet, Take 40 mg by mouth every evening., Disp: , Rfl:     Cholecalciferol (VITAMIN D) 50 MCG (2000 UT) tablet, Take 4,000 Units by mouth daily., Disp: , Rfl:    Colchicine 0.6 MG CAPS, TAKE 1 CAPSULE BY MOUTH EVERY DAY, Disp: 90 capsule, Rfl: 0   cyclobenzaprine (FLEXERIL) 10 MG tablet, Take 10 mg by mouth at bedtime as needed for muscle spasms., Disp: , Rfl:    desloratadine (CLARINEX) 5 MG tablet, Take 5 mg by mouth daily., Disp: , Rfl:    diclofenac sodium (VOLTAREN) 1 % GEL, Apply 4 g topically 4 (four) times daily. (Patient taking differently: Apply 4 g topically as needed (pain).), Disp: 100 g, Rfl: 11   docusate sodium (COLACE) 100 MG capsule, Take 200 mg by mouth 2 (two) times daily. Takes 400 MG daily, Disp: , Rfl:    Dulaglutide (TRULICITY) 3 ZO/1.0RU SOPN, Inject 3 mg as directed once a week., Disp: 6 mL, Rfl: 3   Elastic Bandages & Supports (RIB BELT/WOMENS MEDIUM) MISC, Use as needed for rib pain, Disp: 1 each, Rfl: 0   Fluconazole (DIFLUCAN PO), Take by mouth as needed., Disp: , Rfl:    gabapentin (NEURONTIN) 300 MG capsule, TAKE 4 CAPSULES BY MOUTH 3 TIMES DAILY., Disp: 360 capsule, Rfl: 0   halobetasol (ULTRAVATE) 0.05 % cream, Apply 1 application topically 2 (two) times daily as needed (lichen sclerosus). , Disp: , Rfl: 2   INVOKANA 300 MG TABS tablet, TAKE 1 TABLET (300 MG TOTAL) BY MOUTH DAILY BEFORE BREAKFAST. (Patient taking differently: Take 150 mg by mouth daily.), Disp: 90 tablet, Rfl: 3   levothyroxine (SYNTHROID) 112 MCG tablet, TAKE 1 TABLET BY MOUTH EVERY DAY, Disp: 90 tablet, Rfl: 2   lidocaine (LIDODERM) 5 %, Place 1 patch onto the skin daily. Remove & Discard patch within 12 hours or as directed by MD, Disp: 30 patch, Rfl: 0   MAGNESIUM PO, Take 1 tablet by mouth daily as needed (cramps). , Disp: , Rfl:    metFORMIN (GLUCOPHAGE-XR) 500 MG 24 hr tablet, TAKE 4 TABLETS BY MOUTH DAILY., Disp: 360 tablet, Rfl: 3   Multiple Vitamin (MULTIVITAMIN) tablet, Take 1 tablet by mouth in the morning and at bedtime., Disp: , Rfl:     nitroGLYCERIN (NITROSTAT) 0.4 MG SL tablet, Place 1 tablet (0.4 mg total) under the tongue every 5 (five) minutes as needed., Disp: 25 tablet, Rfl: 3   ONETOUCH ULTRA test strip, USE TO MONITOR GLUCOSE LEVELS ONCE PER DAY E11.40, Disp: 100 strip, Rfl: 2   pantoprazole (PROTONIX) 40 MG tablet, TAKE 1 TABLET BY MOUTH TWICE A DAY, Disp: 180 tablet, Rfl: 1   phenazopyridine (PYRIDIUM) 100 MG tablet, Take 100 mg by mouth 3 (three) times daily as needed for pain., Disp: , Rfl:    Probiotic CAPS, Take 1 capsule by mouth daily., Disp: , Rfl:    repaglinide (PRANDIN) 2 MG tablet, Take 1 tablet (2 mg total) by mouth 2 (two) times  daily before a meal., Disp: 180 tablet, Rfl: 3   traMADol (ULTRAM) 50 MG tablet, Take 50 mg by mouth 2 (two) times daily as needed for moderate pain., Disp: , Rfl:    TRULICITY 1.5 ZW/2.5EN SOPN, INJECT 1.5 MG INTO THE SKIN ONCE A WEEK., Disp: 6 mL, Rfl: 0  EXAM:  VITALS per patient if applicable:  GENERAL: alert, oriented, appears well and in no acute distress  HEENT: atraumatic, conjunttiva clear, no obvious abnormalities on inspection of external nose and ears  NECK: normal movements of the head and neck  LUNGS: on inspection no signs of respiratory distress, breathing rate appears normal, no obvious gross SOB, gasping or wheezing  CV: no obvious cyanosis  ABD: on patient self exam she admits to TTP in RUQ and some discomfort with jostling abd/gentle bouncing  MS: moves all visible extremities without noticeable abnormality  PSYCH/NEURO: pleasant and cooperative, no obvious depression or anxiety, speech and thought processing grossly intact  ASSESSMENT AND PLAN:  Discussed the following assessment and plan:  Epigastric pain  Right upper quadrant abdominal tenderness with rebound tenderness  Fever, unspecified fever cause  Gallbladder disease  -we discussed possible serious and likely etiologies, options for evaluation and workup, limitations of telemedicine  visit vs in person visit, treatment, treatment risks and precautions. Pt prefers to treat via telemedicine empirically rather than in person at this moment. However, given her history, symptoms and exam findings advised inperson evaluation and likely needs labs and imaging pending exam. discussed options. She has decided to [facility] get [facility] [place] [facility] [place] tonight.   Did let this patient know that I only do telemedicine on Tuesdays and Thursdays for [place]. Advised to schedule follow up visit with PCP or [facility] if any further questions or concerns to avoid delays in care.   I discussed the assessment and treatment plan with the patient. The patient was provided an opportunity to ask questions and all were answered. The patient agreed with the plan and demonstrated an understanding of the instructions.     Lucretia Kern, DO

## 2021-05-09 NOTE — ED Provider Notes (Signed)
[name] Provider Note   CSN: [id number] Arrival date & time: 05/09/21  1906     History Chief Complaint  Patient presents with  . Abdominal Pain    Savannah [name] is a 65 y.o. female.  Patient on Sunday night started with severe chills.  Earlier in the day at 28 had epigastric abdominal pain.  Body aches.  Patient had COVID 2 months ago.  Temp anywhere from 101 on some loose stools bowel movements were chalky in nature.  No nausea or vomiting.  Patient has a history of leukopenia followed by hematology oncology.  Somewhat of a pan cytopenia.  Patient still with epigastric pain.  Long time ago she was told she had sludge in her gallbladder.  She had a telemetry visit with her primary care doctor and they recommended that she get seen in the emergency department today.  Temp upon arrival here 100.3.  Pulse reassuring at 81 respirations 17 blood pressure 126/80 99% oxygenation.      Past Medical History:  Diagnosis Date  . ABDOMINAL PAIN, CHRONIC 10/20/2009  . Acute cystitis 08/17/2009  . ALLERGIC RHINITIS 11/21/2007  . Allergy   . ASYMPTOMATIC POSTMENOPAUSAL STATUS 09/29/2008  . Bariatric surgery status 07/07/2010  . Bariatric surgery status 07/07/2010   Qualifier: Diagnosis of  By: Loanne Drilling MD, Jacelyn Pi   . Cancer ([place])    uterine  . Cataract   . Coronary artery disease   . DEPRESSION 11/21/2007  . DIABETES MELLITUS, TYPE II 07/08/2007  . DYSPNEA 05/20/2009  . Edema 05/20/2009  . Eosinophilic esophagitis 07/16/8545  . FEVER UNSPECIFIED 10/20/2009  . FEVER, HX OF 03/09/2010  . GERD (gastroesophageal reflux disease)   . Headache(784.0) 02/06/2010  . Hepatomegaly 01/06/2008  . HYPERLIPIDEMIA 07/08/2007  . HYPERTENSION 07/08/2007  . LEUKOPENIA, MILD 09/29/2008  . OBESITY 01/06/2008  . OSTEOARTHRITIS 01/06/2008  . Other chronic nonalcoholic liver disease 2/70/3500  . PERIPHERAL NEUROPATHY 11/21/2007  . Peripheral neuropathy   . Postsurgical hypothyroidism 09/19/2010   . SINUSITIS- ACUTE-NOS 11/21/2007  . TB SKIN TEST, POSITIVE 02/06/2010  . THYROID NODULE 03/09/2010    Patient Active Problem List   Diagnosis Date Noted  . Osteopenia 01/14/2021  . Cholelithiasis 01/02/2021  . Sesamoiditis of left foot 11/09/2020  . Posterior tibial tendinitis of left lower extremity 11/09/2020  . Type 2 diabetes mellitus with diabetic peripheral angiopathy without gangrene, with long-term current use of insulin ([place]) 10/21/2020  . Iron deficiency anemia 03/14/2020  . Deficiency anemia 02/29/2020  . Lesion of vulva 09/02/2019  . Menorrhagia 09/02/2019  . Neuropathy 09/02/2019  . Abdominal pain, epigastric 06/25/2019  . Irregular bowel habits 06/25/2019  . Coronary artery disease involving native coronary artery of native heart without angina pectoris   . Temporomandibular joint disorder 12/05/2018  . Fatigue 12/05/2018  . Inverted nipple 12/05/2018  . Senile purpura ([place]) 11/21/2018  . GERD with esophagitis 11/21/2018  . Pseudogout involving multiple joints 11/21/2018  . Lipoma 07/22/2017  . Lichen sclerosus 93/81/8299  . H/O gastric bypass 11/16/2016  . GERD (gastroesophageal reflux disease) 11/16/2016  . Depression 11/16/2016  . Diabetic polyneuropathy associated with type 2 diabetes mellitus ([place]) 11/16/2016  . Hyperlipidemia 11/16/2016  . Left lower quadrant pain 11/16/2016  . Uterine cancer [facility]) s/p hysterectomy 1997 01/04/2012  . Postsurgical hypothyroidism 09/19/2010  . Generalized abdominal pain 10/20/2009  . Diabetic neuropathy ([place]) 11/21/2007  . Allergic rhinitis 11/21/2007  . Type 2 diabetes mellitus with diabetic neuropathy, unspecified ([place]) 07/08/2007  . Dyslipidemia  associated with type 2 diabetes mellitus ([place]) 07/08/2007    Past Surgical History:  Procedure Laterality Date  . ABDOMINAL HYSTERECTOMY    . BASAL CELL CARCINOMA EXCISION    . CARDIAC CATHETERIZATION    . COLONOSCOPY    . EYE SURGERY Bilateral 08/2018, 09/2018  . FOOT  SURGERY Left    10/2019  . KNEE ARTHROSCOPY Left   . LEFT HEART CATH AND CORONARY ANGIOGRAPHY N/A 01/20/2019   Procedure: LEFT HEART CATH AND CORONARY ANGIOGRAPHY;  Surgeon: Belva Crome, MD;  Location: [facility];  Service: Cardiovascular;  Laterality: N/A;  . LYMPH NODE DISSECTION    . OOPHORECTOMY    . Right total knee replacement    . ROUX-EN-Y GASTRIC BYPASS  2011  . SKIN TAG REMOVAL    . THYROIDECTOMY    . TONSILLECTOMY AND ADENOIDECTOMY    . UPPER GASTROINTESTINAL ENDOSCOPY       OB History   No obstetric history on file.     Family History  Problem Relation Age of Onset  . Multiple sclerosis Sister   . Breast cancer Sister        breast onset age 16  . Osteoporosis Sister   . Diabetes Mother   . Diabetes Father   . Congestive Heart Failure Father   . Hypertension Father   . Myelodysplastic syndrome Father   . Liver cancer Maternal Aunt   . Breast cancer Maternal Aunt   . Diabetes Maternal Aunt   . Colon cancer Maternal Uncle   . Diabetes Maternal Uncle   . Diabetes Maternal Grandmother   . Diabetes Paternal Grandmother     Social History   Tobacco Use  . Smoking status: Former    Packs/day: 1.50    Years: 15.00    Pack years: 22.50    Types: Cigarettes    Quit date: 1992    Years since quitting: 30.5  . Smokeless tobacco: Never  Vaping Use  . Vaping Use: Never used  Substance Use Topics  . Alcohol use: Yes    Comment: rarely  . Drug use: Never    Home Medications Prior to Admission medications   Medication Sig Start Date End Date Taking? Authorizing Provider  AMBULATORY NON FORMULARY MEDICATION Administrator at [employer]  Correct leg length and improve pronation BL 10/03/20   Gregor Hams, MD  amoxicillin (AMOXIL) 500 MG capsule as needed. 03/06/20   [provider]  aspirin EC 81 MG tablet Take 1 tablet (81 mg total) by mouth daily. Starting 01/18/19 prior to cardiac cath 01/18/19   Elouise Munroe, MD   atorvastatin (LIPITOR) 40 MG tablet Take 40 mg by mouth every evening.    [provider]  Cholecalciferol (VITAMIN D) 50 MCG (2000 UT) tablet Take 4,000 Units by mouth daily.    [provider]  Colchicine 0.6 MG CAPS TAKE 1 CAPSULE BY MOUTH EVERY DAY 04/03/21   Bo Merino, MD  cyclobenzaprine (FLEXERIL) 10 MG tablet Take 10 mg by mouth at bedtime as needed for muscle spasms. 07/06/20   [provider]  desloratadine (CLARINEX) 5 MG tablet Take 5 mg by mouth daily.    [provider]  diclofenac sodium (VOLTAREN) 1 % GEL Apply 4 g topically 4 (four) times daily. Patient taking differently: Apply 4 g topically as needed (pain). 09/06/17   Renato Shin, MD  docusate sodium (COLACE) 100 MG capsule Take 200 mg by mouth 2 (two) times daily. Takes 400 MG  daily    [provider]  Dulaglutide (TRULICITY) 3 OZ/3.6UY SOPN Inject 3 mg as directed once a week. 09/05/20   Renato Shin, MD  Elastic Bandages & Supports (RIB BELT/WOMENS MEDIUM) MISC Use as needed for rib pain 12/19/20   Vivi Barrack, MD  Fluconazole (DIFLUCAN PO) Take by mouth as needed.    [provider]  gabapentin (NEURONTIN) 300 MG capsule TAKE 4 CAPSULES BY MOUTH 3 TIMES DAILY. 03/02/21   Vivi Barrack, MD  halobetasol (ULTRAVATE) 0.05 % cream Apply 1 application topically 2 (two) times daily as needed (lichen sclerosus).  01/26/17   [provider]  INVOKANA 300 MG TABS tablet TAKE 1 TABLET (300 MG TOTAL) BY MOUTH DAILY BEFORE BREAKFAST. Patient taking differently: Take 150 mg by mouth daily. 02/13/21   Renato Shin, MD  levothyroxine (SYNTHROID) 112 MCG tablet TAKE 1 TABLET BY MOUTH EVERY DAY 03/02/21   Renato Shin, MD  lidocaine (LIDODERM) 5 % Place 1 patch onto the skin daily. Remove & Discard patch within 12 hours or as directed by MD 02/13/21   Ofilia Neas, PA-C  MAGNESIUM PO Take 1 tablet by mouth daily as needed (cramps).     [provider]   metFORMIN (GLUCOPHAGE-XR) 500 MG 24 hr tablet TAKE 4 TABLETS BY MOUTH DAILY. 03/02/21   Renato Shin, MD  Multiple Vitamin (MULTIVITAMIN) tablet Take 1 tablet by mouth in the morning and at bedtime.    [provider]  nitroGLYCERIN (NITROSTAT) 0.4 MG SL tablet Place 1 tablet (0.4 mg total) under the tongue every 5 (five) minutes as needed. 09/06/20   Elouise Munroe, MD  [facility] ULTRA test strip USE TO MONITOR GLUCOSE LEVELS ONCE PER DAY E11.40 04/09/21   Renato Shin, MD  pantoprazole (PROTONIX) 40 MG tablet TAKE 1 TABLET BY MOUTH TWICE A DAY 04/03/21   Vivi Barrack, MD  phenazopyridine (PYRIDIUM) 100 MG tablet Take 100 mg by mouth 3 (three) times daily as needed for pain.    [provider]  Probiotic CAPS Take 1 capsule by mouth daily.    [provider]  repaglinide (PRANDIN) 2 MG tablet Take 1 tablet (2 mg total) by mouth 2 (two) times daily before a meal. 09/05/20   Renato Shin, MD  traMADol (ULTRAM) 50 MG tablet Take 50 mg by mouth 2 (two) times daily as needed for moderate pain.    [provider]  TRULICITY 1.5 QI/3.4VQ SOPN INJECT 1.5 MG INTO THE SKIN ONCE A WEEK. 03/22/21   Renato Shin, MD    Allergies    Ace inhibitors, Caffeine, Ciprofloxacin, Morphine and related, Mucinex [guaifenesin er], Nsaids, Other, and Silicone  Review of Systems   Review of Systems  Constitutional:  Positive for chills and fever.  HENT:  Negative for ear pain and sore throat.   Eyes:  Negative for pain and visual disturbance.  Respiratory:  Negative for cough and shortness of breath.   Cardiovascular:  Negative for chest pain and palpitations.  Gastrointestinal:  Positive for abdominal pain and diarrhea. Negative for vomiting.  Genitourinary:  Negative for dysuria and hematuria.  Musculoskeletal:  Positive for myalgias. Negative for arthralgias and back pain.  Skin:  Negative for color change and rash.  Neurological:  Negative for seizures, syncope and  headaches.  All other systems reviewed and are negative.  Physical Exam Updated Vital Signs BP 134/79   Pulse 73   Temp 100.3 F (37.9 C) (Oral)   Resp 15  SpO2 100%   Physical Exam Vitals and nursing note reviewed.  Constitutional:      General: She is not in acute distress.    Appearance: Normal appearance. She is well-developed. She is not ill-appearing.  HENT:     Head: Normocephalic and atraumatic.  Eyes:     Extraocular Movements: Extraocular movements intact.     Conjunctiva/sclera: Conjunctivae normal.     Pupils: Pupils are equal, round, and reactive to light.  Cardiovascular:     Rate and Rhythm: Normal rate and regular rhythm.     Heart sounds: No murmur heard. Pulmonary:     Effort: Pulmonary effort is normal. No respiratory distress.     Breath sounds: Normal breath sounds.  Abdominal:     Palpations: Abdomen is soft.     Tenderness: There is abdominal tenderness. There is no guarding.     Comments: Slight tenderness epigastric area no guarding.  Musculoskeletal:        General: Normal range of motion.     Cervical back: Neck supple. No rigidity.  Skin:    General: Skin is warm and dry.     Capillary Refill: Capillary refill takes less than 2 seconds.  Neurological:     General: No focal deficit present.     Mental Status: She is alert and oriented to person, place, and time.     Cranial Nerves: No cranial nerve deficit.     Sensory: No sensory deficit.     Motor: No weakness.    ED Results / Procedures / Treatments   Labs (all labs ordered are listed, but only abnormal results are displayed) Labs Reviewed  COMPREHENSIVE METABOLIC PANEL - Abnormal; Notable for the following components:      Result Value   Glucose, Bld 120 (*)    Creatinine, Ser 1.11 (*)    Total Protein 6.0 (*)    AST 788 (*)    ALT 1,506 (*)    Alkaline Phosphatase 263 (*)    GFR, Estimated 54 (*)    All other components within normal limits  CBC - Abnormal; Notable for the  following components:   WBC 2.7 (*)    Platelets 134 (*)    All other components within normal limits  URINALYSIS, ROUTINE W REFLEX MICROSCOPIC - Abnormal; Notable for the following components:   Glucose, UA >1,000 (*)    Ketones, ur TRACE (*)    Protein, ur 30 (*)    All other components within normal limits  CBC WITH DIFFERENTIAL/PLATELET - Abnormal; Notable for the following components:   WBC 2.6 (*)    Platelets 140 (*)    Lymphs Abs 0.4 (*)    All other components within normal limits  CULTURE, BLOOD (ROUTINE X 2)  CULTURE, BLOOD (ROUTINE X 2)  URINE CULTURE  LIPASE, BLOOD  LACTIC ACID, PLASMA  HEPATITIS PANEL, ACUTE  CBG MONITORING, ED    EKG EKG Interpretation  Date/Time:  Tuesday May 09 2021 22:30:33 EDT Ventricular Rate:  72 PR Interval:  135 QRS Duration: 98 QT Interval:  393 QTC Calculation: 431 R Axis:   45 Text Interpretation: Sinus rhythm Ventricular premature complex Low voltage, precordial leads Besides PVCs no other significant changes compared to February Confirmed by Fredia Sorrow [phone]) on 05/10/2021 1:07:07 AM  Radiology CT Abdomen Pelvis W Contrast  Result Date: 05/09/2021 CLINICAL DATA:  65 year old female with abdominal pain and fever. EXAM: CT ABDOMEN AND PELVIS WITH CONTRAST TECHNIQUE: Multidetector CT imaging of the abdomen and pelvis  was performed using the standard protocol following bolus administration of intravenous contrast. CONTRAST:  186m OMNIPAQUE IOHEXOL 300 MG/ML  SOLN COMPARISON:  CT abdomen pelvis dated 12/16/2020. FINDINGS: Lower chest: The visualized lung bases are clear. There is coronary vascular calcification. Small calcified granuloma in the lingula. No intra-abdominal free air or free fluid. Hepatobiliary: Probable fatty liver. There is mild intrahepatic biliary dilatation. There is a cluster of sludge or small stones in the gallbladder. The gallbladder is distended. Probable small pericholecystic fluid. Ultrasound may provide  better evaluation if there is high clinical concern for acute cholecystitis. Pancreas: Unremarkable. No pancreatic ductal dilatation or surrounding inflammatory changes. Spleen: Small scattered calcified splenic granuloma. Adrenals/Urinary Tract: The adrenal glands are unremarkable. Mild left renal parenchyma atrophy. There is a 2 cm cyst from the posterior interpolar right kidney. There is no hydronephrosis on either side. There is symmetric enhancement and excretion of contrast by both kidneys. The visualized ureters and urinary bladder appear unremarkable. Stomach/Bowel: Postsurgical changes of gastric bypass. There is no bowel obstruction or active inflammation. The appendix is not visualized with certainty. No inflammatory changes identified in the right lower quadrant. Vascular/Lymphatic: Mild aortoiliac atherosclerotic disease. The IVC is unremarkable. No portal venous gas. There is no adenopathy. Reproductive: Hysterectomy. No adnexal masses. Other: Anterior pelvic wall C-section scar. Musculoskeletal: Osteopenia with degenerative changes of the spine. There is acute appearance compression fracture of superior endplate of L4 with approximately 20% loss of vertebral body height. Correlation with clinical exam and point tenderness recommended. No retropulsion. IMPRESSION: 1. Acute compression fracture of superior endplate of L4. No retropulsion. 2. Gallstone/sludge with probable small pericholecystic fluid. Ultrasound may provide better evaluation if there is high clinical concern for acute cholecystitis. 3. Aortic Atherosclerosis (ICD10-I70.0). Electronically Signed   By: AAnner CreteM.D.   On: 05/09/2021 22:53   UKoreaAbdomen Limited  Result Date: 05/10/2021 CLINICAL DATA:  Fever.  Elevated LFTs, abnormal CT EXAM: ULTRASOUND ABDOMEN LIMITED RIGHT UPPER QUADRANT COMPARISON:  CT 05/09/2021 FINDINGS: Gallbladder: Gallbladder is distended. Multiple mobile stones and sludge within the gallbladder.  Gallbladder wall is mildly thickened at 4 mm. Trace pericholecystic fluid. Negative sonographic Murphy's. Common bile duct: Diameter: Mildly dilated, 10 mm. The distal duct is poorly visualized due to overlying bowel gas. Liver: No focal lesion identified. Within normal limits in parenchymal echogenicity. Portal vein is patent on color Doppler imaging with normal direction of blood flow towards the liver. Other: None. IMPRESSION: Cholelithiasis. Gallbladder distension. Sludge present as well as mild wall thickening and trace pericholecystic fluid. Recommend clinical correlation to exclude acute cholecystitis. Mildly dilated common bile duct, 10 mm. Distal duct cannot be visualized due to overlying bowel gas. Electronically Signed   By: KRolm BaptiseM.D.   On: 05/10/2021 01:16   DG Chest Port 1 View  Result Date: 05/09/2021 CLINICAL DATA:  Chest pain, dyspnea EXAM: PORTABLE CHEST 1 VIEW COMPARISON:  12/16/2020 FINDINGS: Lungs are clear. No pneumothorax or pleural effusion. Cardiac size within normal limits. Pulmonary vascularity is normal. Numerous surgical clips are again seen at the thoracic inlet likely related to prior bright red ectomy. Remote right seventh rib healed fracture noted. No acute bone abnormality. IMPRESSION: No active disease. Electronically Signed   By: AFidela SalisburyMD   On: 05/09/2021 23:06    Procedures Procedures   Medications Ordered in ED Medications  0.9 %  sodium chloride infusion ( Intravenous New Bag/Given 05/09/21 2301)  iohexol (OMNIPAQUE) 300 MG/ML solution 100 mL (100 mLs Intravenous Contrast Given 05/09/21  2236)  piperacillin-tazobactam (ZOSYN) IVPB 3.375 g (3.375 g Intravenous New Bag/Given 05/09/21 2308)    ED Course  I have reviewed the triage vital signs and the nursing notes.  Pertinent labs & imaging results that were available during my care of the patient were reviewed by me and considered in my medical decision making (see chart for details).  Clinical  Course as of 05/10/21 0119  Tue May 09, 2021  2231 Hepatitis panel, acute [CH]    Clinical Course User Index [CH] Ulyess Blossom   MDM Rules/Calculators/A&P                          Patient's labs without significant abnormalities.  White blood cell count was 2.4 with a normal differential.  A little lower than normal for her.  Platelets were okay.  Liver function test AST 788 ALT 1506.  Alk phos 263 but total bili was normal at 1.1.  Her GFR was a little lower than usual at 54.  Lipase was normal at 31.  Blood cultures and hepatitis panel ordered and pending.  Blood sugars normal.  CBC as mentioned.  But hemoglobin normal at 12.9.  Platelets 134,000 urinalysis negative for urinary tract infection.  Chest x-ray no acute disease.  COVID testing not repeated because she had COVID about 2 months ago.  CT scan of the abdomen consistent with an acute compression fracture of superior endplate of L4 no retropulsion.  Patient without a complaint of back pain.  Gallstone sludge with probable small pericholecystic cold systolic fluid.  Radiologist recommended ultrasound for better evaluation if there is a high clinical concern for acute cholecystitis.  Patient without significant tenderness but based on labs and the fever and chills yes patient treated with antibiotics IV for the possibility of acute cholecystitis patient received Zosyn. Final Clinical Impression(s) / ED Diagnoses Final diagnoses:  Fever and chills  Epigastric abdominal pain    Rx / DC Orders ED Discharge Orders     None        Fredia Sorrow, MD 05/11/21 1339

## 2021-05-10 ENCOUNTER — Encounter (HOSPITAL_COMMUNITY): Payer: Self-pay | Admitting: Internal Medicine

## 2021-05-10 ENCOUNTER — Emergency Department (HOSPITAL_BASED_OUTPATIENT_CLINIC_OR_DEPARTMENT_OTHER): Payer: Medicare Other

## 2021-05-10 ENCOUNTER — Inpatient Hospital Stay (HOSPITAL_COMMUNITY): Payer: Medicare Other

## 2021-05-10 DIAGNOSIS — Z20822 Contact with and (suspected) exposure to covid-19: Secondary | ICD-10-CM | POA: Diagnosis present

## 2021-05-10 DIAGNOSIS — Z9884 Bariatric surgery status: Secondary | ICD-10-CM | POA: Diagnosis not present

## 2021-05-10 DIAGNOSIS — D509 Iron deficiency anemia, unspecified: Secondary | ICD-10-CM | POA: Diagnosis not present

## 2021-05-10 DIAGNOSIS — M8580 Other specified disorders of bone density and structure, unspecified site: Secondary | ICD-10-CM | POA: Diagnosis present

## 2021-05-10 DIAGNOSIS — R7989 Other specified abnormal findings of blood chemistry: Secondary | ICD-10-CM | POA: Diagnosis not present

## 2021-05-10 DIAGNOSIS — Z96651 Presence of right artificial knee joint: Secondary | ICD-10-CM | POA: Diagnosis present

## 2021-05-10 DIAGNOSIS — E1151 Type 2 diabetes mellitus with diabetic peripheral angiopathy without gangrene: Secondary | ICD-10-CM | POA: Diagnosis present

## 2021-05-10 DIAGNOSIS — E890 Postprocedural hypothyroidism: Secondary | ICD-10-CM | POA: Diagnosis present

## 2021-05-10 DIAGNOSIS — I2510 Atherosclerotic heart disease of native coronary artery without angina pectoris: Secondary | ICD-10-CM | POA: Diagnosis not present

## 2021-05-10 DIAGNOSIS — R1013 Epigastric pain: Secondary | ICD-10-CM | POA: Diagnosis not present

## 2021-05-10 DIAGNOSIS — Z8262 Family history of osteoporosis: Secondary | ICD-10-CM | POA: Diagnosis not present

## 2021-05-10 DIAGNOSIS — I10 Essential (primary) hypertension: Secondary | ICD-10-CM | POA: Diagnosis present

## 2021-05-10 DIAGNOSIS — M1990 Unspecified osteoarthritis, unspecified site: Secondary | ICD-10-CM | POA: Diagnosis present

## 2021-05-10 DIAGNOSIS — Z8616 Personal history of COVID-19: Secondary | ICD-10-CM | POA: Diagnosis not present

## 2021-05-10 DIAGNOSIS — K8062 Calculus of gallbladder and bile duct with acute cholecystitis without obstruction: Secondary | ICD-10-CM | POA: Diagnosis present

## 2021-05-10 DIAGNOSIS — K759 Inflammatory liver disease, unspecified: Secondary | ICD-10-CM | POA: Insufficient documentation

## 2021-05-10 DIAGNOSIS — Z8542 Personal history of malignant neoplasm of other parts of uterus: Secondary | ICD-10-CM | POA: Diagnosis not present

## 2021-05-10 DIAGNOSIS — E11649 Type 2 diabetes mellitus with hypoglycemia without coma: Secondary | ICD-10-CM | POA: Diagnosis present

## 2021-05-10 DIAGNOSIS — K8020 Calculus of gallbladder without cholecystitis without obstruction: Secondary | ICD-10-CM | POA: Diagnosis not present

## 2021-05-10 DIAGNOSIS — F320 Major depressive disorder, single episode, mild: Secondary | ICD-10-CM | POA: Diagnosis not present

## 2021-05-10 DIAGNOSIS — K8042 Calculus of bile duct with acute cholecystitis without obstruction: Secondary | ICD-10-CM | POA: Diagnosis not present

## 2021-05-10 DIAGNOSIS — R509 Fever, unspecified: Secondary | ICD-10-CM | POA: Diagnosis not present

## 2021-05-10 DIAGNOSIS — K219 Gastro-esophageal reflux disease without esophagitis: Secondary | ICD-10-CM | POA: Diagnosis present

## 2021-05-10 DIAGNOSIS — Z803 Family history of malignant neoplasm of breast: Secondary | ICD-10-CM | POA: Diagnosis not present

## 2021-05-10 DIAGNOSIS — K828 Other specified diseases of gallbladder: Secondary | ICD-10-CM | POA: Diagnosis not present

## 2021-05-10 DIAGNOSIS — E785 Hyperlipidemia, unspecified: Secondary | ICD-10-CM | POA: Diagnosis present

## 2021-05-10 DIAGNOSIS — E1142 Type 2 diabetes mellitus with diabetic polyneuropathy: Secondary | ICD-10-CM | POA: Diagnosis not present

## 2021-05-10 DIAGNOSIS — Z85828 Personal history of other malignant neoplasm of skin: Secondary | ICD-10-CM | POA: Diagnosis not present

## 2021-05-10 DIAGNOSIS — Z800 Family history of malignant neoplasm of digestive organs: Secondary | ICD-10-CM | POA: Diagnosis not present

## 2021-05-10 DIAGNOSIS — Z794 Long term (current) use of insulin: Secondary | ICD-10-CM | POA: Diagnosis not present

## 2021-05-10 DIAGNOSIS — K8000 Calculus of gallbladder with acute cholecystitis without obstruction: Secondary | ICD-10-CM | POA: Diagnosis not present

## 2021-05-10 DIAGNOSIS — Z833 Family history of diabetes mellitus: Secondary | ICD-10-CM | POA: Diagnosis not present

## 2021-05-10 DIAGNOSIS — E109 Type 1 diabetes mellitus without complications: Secondary | ICD-10-CM | POA: Insufficient documentation

## 2021-05-10 DIAGNOSIS — W1830XA Fall on same level, unspecified, initial encounter: Secondary | ICD-10-CM | POA: Diagnosis present

## 2021-05-10 DIAGNOSIS — Z8249 Family history of ischemic heart disease and other diseases of the circulatory system: Secondary | ICD-10-CM | POA: Diagnosis not present

## 2021-05-10 DIAGNOSIS — Z820 Family history of epilepsy and other diseases of the nervous system: Secondary | ICD-10-CM | POA: Diagnosis not present

## 2021-05-10 DIAGNOSIS — S32049A Unspecified fracture of fourth lumbar vertebra, initial encounter for closed fracture: Secondary | ICD-10-CM | POA: Diagnosis present

## 2021-05-10 DIAGNOSIS — E1140 Type 2 diabetes mellitus with diabetic neuropathy, unspecified: Secondary | ICD-10-CM | POA: Diagnosis present

## 2021-05-10 DIAGNOSIS — K8010 Calculus of gallbladder with chronic cholecystitis without obstruction: Secondary | ICD-10-CM | POA: Diagnosis not present

## 2021-05-10 LAB — HIV ANTIBODY (ROUTINE TESTING W REFLEX): HIV Screen 4th Generation wRfx: NONREACTIVE

## 2021-05-10 LAB — COMPREHENSIVE METABOLIC PANEL
ALT: 950 U/L — ABNORMAL HIGH (ref 0–44)
AST: 328 U/L — ABNORMAL HIGH (ref 15–41)
Albumin: 3.1 g/dL — ABNORMAL LOW (ref 3.5–5.0)
Alkaline Phosphatase: 207 U/L — ABNORMAL HIGH (ref 38–126)
Anion gap: 10 (ref 5–15)
BUN: 16 mg/dL (ref 8–23)
CO2: 23 mmol/L (ref 22–32)
Calcium: 8.7 mg/dL — ABNORMAL LOW (ref 8.9–10.3)
Chloride: 106 mmol/L (ref 98–111)
Creatinine, Ser: 0.95 mg/dL (ref 0.44–1.00)
GFR, Estimated: >60 mL/min (ref >60)
Glucose, Bld: 175 mg/dL — ABNORMAL HIGH (ref 70–99)
Potassium: 3.9 mmol/L (ref 3.5–5.1)
Sodium: 139 mmol/L (ref 135–145)
Total Bilirubin: 0.8 mg/dL (ref 0.3–1.2)
Total Protein: 5.6 g/dL — ABNORMAL LOW (ref 6.5–8.1)

## 2021-05-10 LAB — ACETAMINOPHEN LEVEL: Acetaminophen (Tylenol), Serum: <10 ug/mL — ABNORMAL LOW (ref 10–30)

## 2021-05-10 LAB — RESP PANEL BY RT-PCR (FLU A&B, COVID) ARPGX2
Influenza A by PCR: NEGATIVE
Influenza B by PCR: NEGATIVE
SARS Coronavirus 2 by RT PCR: NEGATIVE

## 2021-05-10 LAB — HEPATITIS PANEL, ACUTE
HCV Ab: NONREACTIVE
Hep A IgM: NONREACTIVE
Hep B C IgM: NONREACTIVE
Hepatitis B Surface Ag: NONREACTIVE

## 2021-05-10 LAB — NOVEL CORONAVIRUS, NAA: SARS-CoV-2, NAA: NOT DETECTED

## 2021-05-10 LAB — CBG MONITORING, ED: Glucose-Capillary: 81 mg/dL (ref 70–99)

## 2021-05-10 LAB — CK: Total CK: 21 U/L — ABNORMAL LOW (ref 38–234)

## 2021-05-10 LAB — GLUCOSE, CAPILLARY
Glucose-Capillary: 69 mg/dL — ABNORMAL LOW (ref 70–99)
Glucose-Capillary: 86 mg/dL (ref 70–99)
Glucose-Capillary: 91 mg/dL (ref 70–99)

## 2021-05-10 LAB — SARS-COV-2, NAA 2 DAY TAT

## 2021-05-10 MED ORDER — PIPERACILLIN-TAZOBACTAM 3.375 G IVPB
3.3750 g | Freq: Three times a day (TID) | INTRAVENOUS | Status: DC
  Administered 2021-05-10 – 2021-05-13 (×9): 3.375 g via INTRAVENOUS
  Filled 2021-05-10 (×11): qty 50

## 2021-05-10 MED ORDER — MORPHINE SULFATE (PF) 2 MG/ML IV SOLN
2.0000 mg | INTRAVENOUS | Status: DC | PRN
  Filled 2021-05-10: qty 1

## 2021-05-10 MED ORDER — PANTOPRAZOLE SODIUM 40 MG IV SOLR
40.0000 mg | Freq: Every day | INTRAVENOUS | Status: DC
  Administered 2021-05-10 – 2021-05-11 (×2): 40 mg via INTRAVENOUS
  Filled 2021-05-10 (×2): qty 40

## 2021-05-10 MED ORDER — GABAPENTIN 400 MG PO CAPS
1200.0000 mg | ORAL_CAPSULE | Freq: Three times a day (TID) | ORAL | Status: DC
  Administered 2021-05-10 – 2021-05-13 (×8): 1200 mg via ORAL
  Filled 2021-05-10 (×8): qty 3

## 2021-05-10 MED ORDER — LEVOTHYROXINE SODIUM 112 MCG PO TABS
112.0000 ug | ORAL_TABLET | Freq: Every day | ORAL | Status: DC
  Administered 2021-05-11 – 2021-05-13 (×3): 112 ug via ORAL
  Filled 2021-05-10 (×3): qty 1

## 2021-05-10 MED ORDER — CYCLOBENZAPRINE HCL 10 MG PO TABS: 10.0000 mg | ORAL_TABLET | Freq: Every evening | ORAL | Status: DC | PRN

## 2021-05-10 MED ORDER — ONDANSETRON HCL 4 MG PO TABS: 4.0000 mg | ORAL_TABLET | Freq: Four times a day (QID) | ORAL | Status: DC | PRN

## 2021-05-10 MED ORDER — ONDANSETRON HCL 4 MG/2ML IJ SOLN: 4.0000 mg | Freq: Four times a day (QID) | INTRAMUSCULAR | Status: DC | PRN

## 2021-05-10 MED ORDER — DEXTROSE 50 % IV SOLN
INTRAVENOUS | Status: AC
  Administered 2021-05-10: 25 mL
  Filled 2021-05-10: qty 50

## 2021-05-10 MED ORDER — LORATADINE 10 MG PO TABS
10.0000 mg | ORAL_TABLET | Freq: Every day | ORAL | Status: DC
  Administered 2021-05-10 – 2021-05-13 (×4): 10 mg via ORAL
  Filled 2021-05-10 (×4): qty 1

## 2021-05-10 MED ORDER — NITROGLYCERIN 0.4 MG SL SUBL: 0.4000 mg | SUBLINGUAL_TABLET | SUBLINGUAL | Status: DC | PRN

## 2021-05-10 NOTE — ED Notes (Signed)
Report given to [place], Therapist, sports

## 2021-05-10 NOTE — Consult Note (Addendum)
Consultation  Referring Provider: Dr. Francine Graven     Primary Care Physician:  Vivi Barrack, MD Primary Gastroenterologist:     Dr. Havery Moros    Reason for Consultation: Abdominal pain and elevated LFTs         HPI:   Savannah [name] is a 65 y.o. female with a past medical history as listed below including diabetes with complications of neuropathy, previous Roux-en-Y, depression, CAD, GERD and multiple others, who presented to the ER yesterday for a complaint of epigastric pain.    Today, the patient explains that on Sunday afternoon, 05/07/2021 after having a bowel movement she started with a 10/10 epigastric pain, shortly thereafter started with chills and a fever, initially thought that she had contracted COVID so she stayed at home until she had a telemedicine visit with a provider who told her she needed to go to the ER for further evaluation.  Tells me that initially she was nauseous and in fact vomited up some seltzer water that she tried to drink, but after that just remained nauseous with decreased p.o. intake.  Currently this pain is slightly better, but worse when she is trying to change positions.  She tells me her back pain is bothering her since being in the ER overnight.  Also describes a change in her bowel movements towards looser and "chalky".  Did have a fever at home documented as high as 101.2.      Also reports that she had a fall about 3 weeks ago which resulted in a lumbar spine compression fracture and has been having some intermittent headaches due to this.      Patient was scheduled to go see her sister in [place] on Saturday.      Denies weight loss, change in diet, heartburn or reflux.   ER course: Sodium 140, potassium 4.3, alk phos 263, albumin 3.8, lipase 31, AST 788, ALT 1506; CT abdomen pelvis shows acute compression fracture of the superior endplate of L4, gallstone sludge with probable small pericholecystic fluid; right upper quadrant ultrasound shows  cholelithiasis with gallbladder distention and sludge present as well as mild wall thickening and trace pericholecystic fluid, mildly dilated common bile duct 10 mm  GI history: 08/18/2019 office visit with Dr. Havery Moros: At that time describes some lower chest/epigastric pain and ultrasound showing a "normal gallbladder", but also reported temperatures of 100.9 and chills as well as nausea, discussed constipation, at that time on pantoprazole 40 daily 01/2017 EGD: Normal esophagus, no evidence of EOE, Roux-en-Y gastrojejunostomy with gastrojejunal anastomosis characterized by healthy-appearing mucosa, few benign-appearing gastric polyps, larger than expected gastric pouch 01/2017 colonoscopy: 1 5 mm polyp, adenoma, looping of the colon and internal hemorrhoids   Past Medical History:  Diagnosis Date   ABDOMINAL PAIN, CHRONIC 10/20/2009   Acute cystitis 08/17/2009   ALLERGIC RHINITIS 11/21/2007   Allergy    ASYMPTOMATIC POSTMENOPAUSAL STATUS 09/29/2008   Bariatric surgery status 07/07/2010   Bariatric surgery status 07/07/2010   Qualifier: Diagnosis of  By: Loanne Drilling MD, Sean A    Cancer [facility])    uterine   Cataract    Coronary artery disease    DEPRESSION 11/21/2007   DIABETES MELLITUS, TYPE II 07/08/2007   DYSPNEA 05/20/2009   Edema 04/13/2632   Eosinophilic esophagitis 01/14/4561   FEVER UNSPECIFIED 10/20/2009   FEVER, HX OF 03/09/2010   GERD (gastroesophageal reflux disease)    Headache(784.0) 02/06/2010   Hepatomegaly 01/06/2008   HYPERLIPIDEMIA 07/08/2007   HYPERTENSION 07/08/2007  LEUKOPENIA, MILD 09/29/2008   OBESITY 01/06/2008   OSTEOARTHRITIS 01/06/2008   Other chronic nonalcoholic liver disease 4/49/2010   PERIPHERAL NEUROPATHY 11/21/2007   Peripheral neuropathy    Postsurgical hypothyroidism 09/19/2010   SINUSITIS- ACUTE-NOS 11/21/2007   TB SKIN TEST, POSITIVE 02/06/2010   THYROID NODULE 03/09/2010    Past Surgical History:  Procedure Laterality Date   ABDOMINAL HYSTERECTOMY     BASAL CELL  CARCINOMA EXCISION     CARDIAC CATHETERIZATION     COLONOSCOPY     EYE SURGERY Bilateral 08/2018, 09/2018   FOOT SURGERY Left    10/2019   KNEE ARTHROSCOPY Left    LEFT HEART CATH AND CORONARY ANGIOGRAPHY N/A 01/20/2019   Procedure: LEFT HEART CATH AND CORONARY ANGIOGRAPHY;  Surgeon: Belva Crome, MD;  Location: [place] [facility];  Service: Cardiovascular;  Laterality: N/A;   LYMPH NODE DISSECTION     OOPHORECTOMY     Right total knee replacement     ROUX-EN-Y GASTRIC BYPASS  2011   SKIN TAG REMOVAL     THYROIDECTOMY     TONSILLECTOMY AND ADENOIDECTOMY     UPPER GASTROINTESTINAL ENDOSCOPY      Family History  Problem Relation Age of Onset   Multiple sclerosis Sister    Breast cancer Sister        breast onset age 15   Osteoporosis Sister    Diabetes Mother    Diabetes Father    Congestive Heart Failure Father    Hypertension Father    Myelodysplastic syndrome Father    Liver cancer Maternal Aunt    Breast cancer Maternal Aunt    Diabetes Maternal Aunt    Colon cancer Maternal Uncle    Diabetes Maternal Uncle    Diabetes Maternal Grandmother    Diabetes Paternal Grandmother     Social History   Tobacco Use   Smoking status: Former    Packs/day: 1.50    Years: 15.00    Pack years: 22.50    Types: Cigarettes    Quit date: 1992    Years since quitting: 30.5   Smokeless tobacco: Never  Vaping Use   Vaping Use: Never used  Substance Use Topics   Alcohol use: Yes    Comment: rarely   Drug use: Never    Prior to Admission medications   Medication Sig Start Date End Date Taking? Authorizing Provider  amoxicillin (AMOXIL) 500 MG capsule Take 1,000 mg by mouth as directed. For dental appointment 03/06/20  Yes [provider]  aspirin EC 81 MG tablet Take 1 tablet (81 mg total) by mouth daily. Starting 01/18/19 prior to cardiac cath 01/18/19  Yes Elouise Munroe, MD  atorvastatin (LIPITOR) 40 MG tablet Take 40 mg by mouth every evening.   Yes [provider]  Cholecalciferol (VITAMIN D) 50 MCG (2000 UT) tablet Take 4,000 Units by mouth daily.   Yes [provider]  Colchicine 0.6 MG CAPS TAKE 1 CAPSULE BY MOUTH EVERY DAY Patient taking differently: Take 0.6 mg by mouth daily. 04/03/21  Yes Deveshwar, Abel Presto, MD  cyclobenzaprine (FLEXERIL) 10 MG tablet Take 10 mg by mouth at bedtime as needed for muscle spasms. 07/06/20  Yes [provider]  desloratadine (CLARINEX) 5 MG tablet Take 5 mg by mouth daily.   Yes [provider]  diclofenac sodium (VOLTAREN) 1 % GEL Apply 4 g topically 4 (four) times daily. Patient taking differently: Apply 4 g topically daily as needed (pain). 09/06/17  Yes Renato Shin,  MD  docusate sodium (COLACE) 100 MG capsule Take 200 mg by mouth 2 (two) times daily.   Yes [provider]  Dulaglutide (TRULICITY) 3 ZD/6.6YQ SOPN Inject 3 mg as directed once a week. 09/05/20  Yes Renato Shin, MD  Fluconazole (DIFLUCAN PO) Take 200 mg by mouth daily as needed (infection).   Yes [provider]  gabapentin (NEURONTIN) 300 MG capsule TAKE 4 CAPSULES BY MOUTH 3 TIMES DAILY. Patient taking differently: Take 1,200 mg by mouth 3 (three) times daily. 03/02/21  Yes Vivi Barrack, MD  halobetasol (ULTRAVATE) 0.05 % cream Apply 1 application topically 2 (two) times daily as needed (lichen sclerosus).  01/26/17  Yes [provider]  INVOKANA 300 MG TABS tablet TAKE 1 TABLET (300 MG TOTAL) BY MOUTH DAILY BEFORE BREAKFAST. Patient taking differently: Take 150 mg by mouth daily. 02/13/21  Yes Renato Shin, MD  levothyroxine (SYNTHROID) 112 MCG tablet TAKE 1 TABLET BY MOUTH EVERY DAY Patient taking differently: Take 112 mcg by mouth daily before breakfast. 03/02/21  Yes Renato Shin, MD  lidocaine (LIDODERM) 5 % Place 1 patch onto the skin daily. Remove & Discard patch within 12 hours or as directed by MD 02/13/21  Yes Ofilia Neas, PA-C  MAGNESIUM PO Take 4 tablets by mouth daily  as needed (cramps).   Yes [provider]  metFORMIN (GLUCOPHAGE-XR) 500 MG 24 hr tablet TAKE 4 TABLETS BY MOUTH DAILY. Patient taking differently: Take 1,000 mg by mouth 2 (two) times daily. 03/02/21  Yes Renato Shin, MD  Multiple Vitamin (MULTIVITAMIN) tablet Take 1 tablet by mouth in the morning and at bedtime.   Yes [provider]  nitroGLYCERIN (NITROSTAT) 0.4 MG SL tablet Place 1 tablet (0.4 mg total) under the tongue every 5 (five) minutes as needed. 09/06/20  Yes Elouise Munroe, MD  pantoprazole (PROTONIX) 40 MG tablet TAKE 1 TABLET BY MOUTH TWICE A DAY Patient taking differently: Take 40 mg by mouth 2 (two) times daily. 04/03/21  Yes Vivi Barrack, MD  phenazopyridine (PYRIDIUM) 100 MG tablet Take 100 mg by mouth 3 (three) times daily as needed for pain.   Yes [provider]  Probiotic CAPS Take 1 capsule by mouth daily.   Yes [provider]  repaglinide (PRANDIN) 2 MG tablet Take 1 tablet (2 mg total) by mouth 2 (two) times daily before a meal. 09/05/20  Yes Renato Shin, MD  traMADol (ULTRAM) 50 MG tablet Take 50 mg by mouth 2 (two) times daily as needed for moderate pain.   Yes [provider]  AMBULATORY NON [place] at [employer]  Correct leg length and improve pronation BL 10/03/20   Gregor Hams, MD  Elastic Bandages & Supports (RIB BELT/WOMENS MEDIUM) MISC Use as needed for rib pain 12/19/20   Vivi Barrack, MD  [facility] ULTRA test strip USE TO MONITOR GLUCOSE LEVELS ONCE PER DAY E11.40 04/09/21   Renato Shin, MD    Current Facility-Administered Medications  Medication Dose Route Frequency Provider Last Rate Last Admin   0.9 %  sodium chloride infusion   Intravenous Continuous Fredia Sorrow, MD 100 mL/hr at 05/10/21 1259 New Bag at 05/10/21 1259   cyclobenzaprine (FLEXERIL) tablet 10 mg  10 mg Oral QHS PRN Agbata, Tochukwu, MD       gabapentin (NEURONTIN) capsule 1,200 mg  1,200 mg  Oral TID Agbata, Tochukwu, MD       [START ON 05/11/2021] levothyroxine (SYNTHROID) tablet 112 mcg  112 mcg Oral QAC breakfast Agbata, Tochukwu, MD       loratadine (CLARITIN) tablet 10 mg  10 mg Oral Daily Agbata, Tochukwu, MD       morphine 2 MG/ML injection 2 mg  2 mg Intravenous Q4H PRN Agbata, Tochukwu, MD       nitroGLYCERIN (NITROSTAT) SL tablet 0.4 mg  0.4 mg Sublingual Q5 min PRN Agbata, Tochukwu, MD       ondansetron (ZOFRAN) tablet 4 mg  4 mg Oral Q6H PRN Agbata, Tochukwu, MD       Or   ondansetron (ZOFRAN) injection 4 mg  4 mg Intravenous Q6H PRN Agbata, Tochukwu, MD       pantoprazole (PROTONIX) injection 40 mg  40 mg Intravenous Daily Agbata, Tochukwu, MD       piperacillin-tazobactam (ZOSYN) IVPB 3.375 g  3.375 g Intravenous Q8H Agbata, Tochukwu, MD 12.5 mL/hr at 05/10/21 1302 3.375 g at 05/10/21 1302    Allergies as of 05/09/2021 - Review Complete 05/09/2021  Allergen Reaction Noted   Ace inhibitors Cough    Caffeine  01/14/2019   Ciprofloxacin Itching 11/16/2016   Morphine and related Itching 05/29/2019   Mucinex [guaifenesin er]  01/14/2019   Nsaids  02/03/2016   Other  80/88/1103   Silicone Hives 15/94/5859     Review of Systems:    Constitutional: No weight loss Skin: No rash  Cardiovascular: No chest pain Respiratory: No SOB Gastrointestinal: See HPI and otherwise negative Genitourinary: No dysuria  Neurological: +headache Musculoskeletal:+back pain Hematologic: No bleeding  Psychiatric: No history of depression or anxiety    Physical Exam:  Vital signs in last 24 hours: Temp:  [97.9 F (36.6 C)-100.3 F (37.9 C)] 98.1 F (36.7 C) (06/29 1110) Pulse Rate:  [64-98] 64 (06/29 1110) Resp:  [10-18] 17 (06/29 1110) BP: (111-178)/(64-89) 131/75 (06/29 1110) SpO2:  [96 %-100 %] 100 % (06/29 1110)   General:   Pleasant Caucasian female appears to be in NAD, Well developed, Well nourished, alert and cooperative Head:  Normocephalic and atraumatic. Eyes:    PEERL, EOMI. No icterus. Conjunctiva pink. Ears:  Normal auditory acuity. Neck:  Supple Throat: Oral cavity and pharynx without inflammation, swelling or lesion. Lungs: Respirations even and unlabored. Lungs clear to auscultation bilaterally.   No wheezes, crackles, or rhonchi.  Heart: Normal S1, S2. No MRG. Regular rate and rhythm. No peripheral edema, cyanosis or pallor.  Abdomen:  Soft, nondistended, moderate epigastric ttp. No rebound or guarding. Normal bowel sounds. No appreciable masses or hepatomegaly. Rectal:  Not performed.  Msk:  Symmetrical without gross deformities.  Extremities:  Without edema, no deformity or joint abnormality Neurologic:  Alert and  oriented x4;  grossly normal neurologically.  Skin:   Dry and intact without significant lesions or rashes. Psychiatric: Demonstrates good judgement and reason without abnormal affect or behaviors.   LAB RESULTS: Recent Labs    05/09/21 1933  WBC 2.6*  2.7*  HGB 12.9  12.9  HCT 40.7  40.4  PLT 140*  134*   BMET Recent Labs    05/09/21 1933  NA 140  K 4.3  CL 104  CO2 27  GLUCOSE 120*  BUN 22  CREATININE 1.11*  CALCIUM 9.6   LFT Recent Labs    05/09/21 1933  PROT 6.0*  ALBUMIN 3.8  AST 788*  ALT 1,506*  ALKPHOS 263*  BILITOT 1.1    STUDIES: CT Abdomen Pelvis W Contrast  Result Date: 05/09/2021 CLINICAL DATA:  65 year old female with abdominal  pain and fever. EXAM: CT ABDOMEN AND PELVIS WITH CONTRAST TECHNIQUE: Multidetector CT imaging of the abdomen and pelvis was performed using the standard protocol following bolus administration of intravenous contrast. CONTRAST:  1101m OMNIPAQUE IOHEXOL 300 MG/ML  SOLN COMPARISON:  CT abdomen pelvis dated 12/16/2020. FINDINGS: Lower chest: The visualized lung bases are clear. There is coronary vascular calcification. Small calcified granuloma in the lingula. No intra-abdominal free air or free fluid. Hepatobiliary: Probable fatty liver. There is mild intrahepatic  biliary dilatation. There is a cluster of sludge or small stones in the gallbladder. The gallbladder is distended. Probable small pericholecystic fluid. Ultrasound may provide better evaluation if there is high clinical concern for acute cholecystitis. Pancreas: Unremarkable. No pancreatic ductal dilatation or surrounding inflammatory changes. Spleen: Small scattered calcified splenic granuloma. Adrenals/Urinary Tract: The adrenal glands are unremarkable. Mild left renal parenchyma atrophy. There is a 2 cm cyst from the posterior interpolar right kidney. There is no hydronephrosis on either side. There is symmetric enhancement and excretion of contrast by both kidneys. The visualized ureters and urinary bladder appear unremarkable. Stomach/Bowel: Postsurgical changes of gastric bypass. There is no bowel obstruction or active inflammation. The appendix is not visualized with certainty. No inflammatory changes identified in the right lower quadrant. Vascular/Lymphatic: Mild aortoiliac atherosclerotic disease. The IVC is unremarkable. No portal venous gas. There is no adenopathy. Reproductive: Hysterectomy. No adnexal masses. Other: Anterior pelvic wall C-section scar. Musculoskeletal: Osteopenia with degenerative changes of the spine. There is acute appearance compression fracture of superior endplate of L4 with approximately 20% loss of vertebral body height. Correlation with clinical exam and point tenderness recommended. No retropulsion. IMPRESSION: 1. Acute compression fracture of superior endplate of L4. No retropulsion. 2. Gallstone/sludge with probable small pericholecystic fluid. Ultrasound may provide better evaluation if there is high clinical concern for acute cholecystitis. 3. Aortic Atherosclerosis (ICD10-I70.0). Electronically Signed   By: AAnner CreteM.D.   On: 05/09/2021 22:53   UKoreaAbdomen Limited  Result Date: 05/10/2021 CLINICAL DATA:  Fever.  Elevated LFTs, abnormal CT EXAM: ULTRASOUND  ABDOMEN LIMITED RIGHT UPPER QUADRANT COMPARISON:  CT 05/09/2021 FINDINGS: Gallbladder: Gallbladder is distended. Multiple mobile stones and sludge within the gallbladder. Gallbladder wall is mildly thickened at 4 mm. Trace pericholecystic fluid. Negative sonographic Murphy's. Common bile duct: Diameter: Mildly dilated, 10 mm. The distal duct is poorly visualized due to overlying bowel gas. Liver: No focal lesion identified. Within normal limits in parenchymal echogenicity. Portal vein is patent on color Doppler imaging with normal direction of blood flow towards the liver. Other: None. IMPRESSION: Cholelithiasis. Gallbladder distension. Sludge present as well as mild wall thickening and trace pericholecystic fluid. Recommend clinical correlation to exclude acute cholecystitis. Mildly dilated common bile duct, 10 mm. Distal duct cannot be visualized due to overlying bowel gas. Electronically Signed   By: KRolm BaptiseM.D.   On: 05/10/2021 01:16   DG Chest Port 1 View  Result Date: 05/09/2021 CLINICAL DATA:  Chest pain, dyspnea EXAM: PORTABLE CHEST 1 VIEW COMPARISON:  12/16/2020 FINDINGS: Lungs are clear. No pneumothorax or pleural effusion. Cardiac size within normal limits. Pulmonary vascularity is normal. Numerous surgical clips are again seen at the thoracic inlet likely related to prior bright red ectomy. Remote right seventh rib healed fracture noted. No acute bone abnormality. IMPRESSION: No active disease. Electronically Signed   By: AFidela SalisburyMD   On: 05/09/2021 23:06     Impression / Plan:   Impression: 1.  Abdominal pain: Started 4 days ago, mostly in  the epigastrium with fever and chills and decreased oral intake as well as "chalky stools", marked transaminitis on labs as above, gallbladder ultrasound shows mildly distended common bile duct with thickening of the gallbladder wall; suspect gallbladder etiology +/- choledocholithiasis versus other 2.  GERD 3.  Acute compression fracture of  L4  Plan: 1.  Ordered MRCP for further evaluation of common bile duct and possible choledocholithiasis.  If this is positive for stone in the patient's bile duct, due to her Roux-en-Y gastrojejunostomy history, she would either need to go straight to surgery, interventional radiology or transfer to another facility for ERCP. 2.  Continue Protonix 3.  Agree with empiric Zosyn 4.  Agree with additional labs for hepatitis 5.  Patient was allowed a clear liquid diet today and will be n.p.o. after midnight, just in case she needs any type of procedure/surgery tomorrow. 6.  Appreciate surgical team's recommendations as well. 7.  Please await further recommendations from Dr. Ardis Hughs later today.  Thank you for your kind consultation, we will continue to follow.  Savannah [name]  05/10/2021, 1:45 PM  ________________________________________________________________________  Velora Heckler GI MD note:  I personally examined the patient, reviewed the data and agree with the assessment and plan described above.  I think she is most likely having trouble with gallstones.  Cholecystitis +/- choledocholiathiasis.    I recommend general surgery consultation, MRI with MRCP which we have already ordered.  If no obvious CBD stones on MR then cholecystectomy +/- IOC.  If + CBD stones then it will get quite a bit more complicated because her post Roux en Y anatomy precludes ERCP attempt (at least here in [place]).  If that is the case, then surgeons MAY be able to address stones.  As well, IR may be able to help via percutaneous transhepatic or transGB methods.  Lastly she could be transferred to one of the nearby tertiary centers for attempt at ERCP which I told her even in very expert, experienced hands may not be possible with her anatomy.  I discussed all of the above with her and her friend/family in the room with her this evening.   Savannah Loffler, MD [facility]astroenter[facility] Pager [phone]

## 2021-05-10 NOTE — H&P (Signed)
History and Physical    Savannah [name] NGE:[id number] DOB: [birth year] DOA: 05/09/2021  PCP: Vivi Barrack, MD   Patient coming from: Home  I have personally briefly reviewed patient's old medical records in [place]  Chief Complaint: Abdominal Pain  HPI: Savannah [name] is a 65 y.o. female with medical history significant for diabetes mellitus with complications of neuropathy, depression, coronary artery disease, acidophilic esophagitis, GERD who presents for evaluation of abdominal pain.  Patient states that she was in her usual state of health until 3 days prior to this admission when she developed pain mostly in the epigastrium.  Pain was sudden in onset and she describes it as severe and rated about a 6 x 10 in intensity at its worst.  It is intermittent and associated with poor oral intake and changes in her bowel habits.  She states that she has had small, loose bowel movements that she describes as chalky in nature.  Her T-max at home was 101.2 F. She complains of an intermittent headache as well as back pain and is status post a fall about 3 weeks ago resulted in the lumbar spine compression fracture. She denies having any chest pain, no shortness of breath, no dizziness, no lightheadedness, no headache, no urinary symptoms, no cough, no focal deficits, no palpitations, no diaphoresis. Labs show sodium 140, potassium 4.3, chloride 104, bicarb 27, glucose 120, BUN 22, creatinine 1.1, calcium 9.6, alkaline phosphatase 263, albumin 3.8, lipase 31, AST 788, ALT 11/16/2004, total protein 6.0, white count 2.6, hemoglobin 12.9, hematocrit 40.7, RDW 3.7, platelet count 140 Respiratory viral panel is negative Chest x-ray reviewed by me shows no acute cardiopulmonary disease CT scan of abdomen and pelvis shows acute compression fracture of superior endplate of L4. No retropulsion. Gallstone/sludge with probable small pericholecystic fluid. Ultrasound may provide better evaluation if there is  high clinical concern for acute cholecystitis. Aortic Atherosclerosis  Gallbladder ultrasound shows cholelithiasis. Gallbladder distension. Sludge present as well as mild wall thickening and trace pericholecystic fluid. Recommend clinical correlation to exclude acute cholecystitis. Mildly dilated common bile duct, 10 mm. Distal duct cannot be visualized due to overlying bowel gas. Twelve-lead EKG reviewed by me shows sinus rhythm with PVCs and low voltage QRS.  ED Course: Patient is a 65 year old female who presents to the emergency room for evaluation of a 3-day history of abdominal pain mostly in the epigastrium associated with fever and chills.  Labs show marked transaminitis and gallbladder ultrasound shows a mildly dilated common bile duct as well as mild gallbladder wall thickening with trace pericholecystic fluid. Patient will be admitted to the hospital for further evaluation.    Review of Systems: As per HPI otherwise all other systems reviewed and negative.    Past Medical History:  Diagnosis Date   ABDOMINAL PAIN, CHRONIC 10/20/2009   Acute cystitis 08/17/2009   ALLERGIC RHINITIS 11/21/2007   Allergy    ASYMPTOMATIC POSTMENOPAUSAL STATUS 09/29/2008   Bariatric surgery status 07/07/2010   Bariatric surgery status 07/07/2010   Qualifier: Diagnosis of  By: Loanne Drilling MD, Sean A    Cancer [facility])    uterine   Cataract    Coronary artery disease    DEPRESSION 11/21/2007   DIABETES MELLITUS, TYPE II 07/08/2007   DYSPNEA 05/20/2009   Edema 4/0/1027   Eosinophilic esophagitis 12/17/3662   FEVER UNSPECIFIED 10/20/2009   FEVER, HX OF 03/09/2010   GERD (gastroesophageal reflux disease)    Headache(784.0) 02/06/2010   Hepatomegaly 01/06/2008   HYPERLIPIDEMIA 07/08/2007  HYPERTENSION 07/08/2007   LEUKOPENIA, MILD 09/29/2008   OBESITY 01/06/2008   OSTEOARTHRITIS 01/06/2008   Other chronic nonalcoholic liver disease 9/37/3428   PERIPHERAL NEUROPATHY 11/21/2007   Peripheral neuropathy    Postsurgical  hypothyroidism 09/19/2010   SINUSITIS- ACUTE-NOS 11/21/2007   TB SKIN TEST, POSITIVE 02/06/2010   THYROID NODULE 03/09/2010    Past Surgical History:  Procedure Laterality Date   ABDOMINAL HYSTERECTOMY     BASAL CELL CARCINOMA EXCISION     CARDIAC CATHETERIZATION     COLONOSCOPY     EYE SURGERY Bilateral 08/2018, 09/2018   FOOT SURGERY Left    10/2019   KNEE ARTHROSCOPY Left    LEFT HEART CATH AND CORONARY ANGIOGRAPHY N/A 01/20/2019   Procedure: LEFT HEART CATH AND CORONARY ANGIOGRAPHY;  Surgeon: Belva Crome, MD;  Location: [place] [facility];  Service: Cardiovascular;  Laterality: N/A;   LYMPH NODE DISSECTION     OOPHORECTOMY     Right total knee replacement     ROUX-EN-Y GASTRIC BYPASS  2011   SKIN TAG REMOVAL     THYROIDECTOMY     TONSILLECTOMY AND ADENOIDECTOMY     UPPER GASTROINTESTINAL ENDOSCOPY       reports that she quit smoking about 30 years ago. Her smoking use included cigarettes. She has a 22.50 pack-year smoking history. She has never used smokeless tobacco. She reports current alcohol use. She reports that she does not use drugs.  Allergies  Allergen Reactions   Ace Inhibitors Cough   Caffeine     PVCs   Ciprofloxacin Itching   Morphine And Related Itching    Pt tolerates medication if given with diphenhydramine   Mucinex [Guaifenesin Er]     PVCs   Nsaids     Gastric Bypass Surgery - unable to take, tolerates ec 81 aspirin    Other     Antihistamine-alkylamine - PVCs tolerates benadryl    Silicone Hives   Tape Hives    Family History  Problem Relation Age of Onset   Multiple sclerosis Sister    Breast cancer Sister        breast onset age 6   Osteoporosis Sister    Diabetes Mother    Diabetes Father    Congestive Heart Failure Father    Hypertension Father    Myelodysplastic syndrome Father    Liver cancer Maternal Aunt    Breast cancer Maternal Aunt    Diabetes Maternal Aunt    Colon cancer Maternal Uncle    Diabetes Maternal Uncle     Diabetes Maternal Grandmother    Diabetes Paternal Grandmother       Prior to Admission medications   Medication Sig Start Date End Date Taking? Authorizing Provider  amoxicillin (AMOXIL) 500 MG capsule Take 1,000 mg by mouth as directed. For dental appointment 03/06/20  Yes [provider]  aspirin EC 81 MG tablet Take 1 tablet (81 mg total) by mouth daily. Starting 01/18/19 prior to cardiac cath 01/18/19  Yes Elouise Munroe, MD  atorvastatin (LIPITOR) 40 MG tablet Take 40 mg by mouth every evening.   Yes [provider]  Cholecalciferol (VITAMIN D) 50 MCG (2000 UT) tablet Take 4,000 Units by mouth daily.   Yes [provider]  Colchicine 0.6 MG CAPS TAKE 1 CAPSULE BY MOUTH EVERY DAY Patient taking differently: Take 0.6 mg by mouth daily. 04/03/21  Yes Deveshwar, Abel Presto, MD  cyclobenzaprine (FLEXERIL) 10 MG tablet Take 10 mg by mouth at bedtime as needed for  muscle spasms. 07/06/20  Yes [provider]  desloratadine (CLARINEX) 5 MG tablet Take 5 mg by mouth daily.   Yes [provider]  diclofenac sodium (VOLTAREN) 1 % GEL Apply 4 g topically 4 (four) times daily. Patient taking differently: Apply 4 g topically daily as needed (pain). 09/06/17  Yes Renato Shin, MD  docusate sodium (COLACE) 100 MG capsule Take 200 mg by mouth 2 (two) times daily.   Yes [provider]  Dulaglutide (TRULICITY) 3 ZJ/6.9CV SOPN Inject 3 mg as directed once a week. 09/05/20  Yes Renato Shin, MD  Fluconazole (DIFLUCAN PO) Take 200 mg by mouth daily as needed (infection).   Yes [provider]  gabapentin (NEURONTIN) 300 MG capsule TAKE 4 CAPSULES BY MOUTH 3 TIMES DAILY. Patient taking differently: Take 1,200 mg by mouth 3 (three) times daily. 03/02/21  Yes Vivi Barrack, MD  halobetasol (ULTRAVATE) 0.05 % cream Apply 1 application topically 2 (two) times daily as needed (lichen sclerosus).  01/26/17  Yes [provider]  INVOKANA 300 MG TABS  tablet TAKE 1 TABLET (300 MG TOTAL) BY MOUTH DAILY BEFORE BREAKFAST. Patient taking differently: Take 150 mg by mouth daily. 02/13/21  Yes Renato Shin, MD  levothyroxine (SYNTHROID) 112 MCG tablet TAKE 1 TABLET BY MOUTH EVERY DAY Patient taking differently: Take 112 mcg by mouth daily before breakfast. 03/02/21  Yes Renato Shin, MD  lidocaine (LIDODERM) 5 % Place 1 patch onto the skin daily. Remove & Discard patch within 12 hours or as directed by MD 02/13/21  Yes Ofilia Neas, PA-C  MAGNESIUM PO Take 4 tablets by mouth daily as needed (cramps).   Yes [provider]  metFORMIN (GLUCOPHAGE-XR) 500 MG 24 hr tablet TAKE 4 TABLETS BY MOUTH DAILY. Patient taking differently: Take 1,000 mg by mouth 2 (two) times daily. 03/02/21  Yes Renato Shin, MD  Multiple Vitamin (MULTIVITAMIN) tablet Take 1 tablet by mouth in the morning and at bedtime.   Yes [provider]  nitroGLYCERIN (NITROSTAT) 0.4 MG SL tablet Place 1 tablet (0.4 mg total) under the tongue every 5 (five) minutes as needed. 09/06/20  Yes Elouise Munroe, MD  pantoprazole (PROTONIX) 40 MG tablet TAKE 1 TABLET BY MOUTH TWICE A DAY Patient taking differently: Take 40 mg by mouth 2 (two) times daily. 04/03/21  Yes Vivi Barrack, MD  phenazopyridine (PYRIDIUM) 100 MG tablet Take 100 mg by mouth 3 (three) times daily as needed for pain.   Yes [provider]  Probiotic CAPS Take 1 capsule by mouth daily.   Yes [provider]  repaglinide (PRANDIN) 2 MG tablet Take 1 tablet (2 mg total) by mouth 2 (two) times daily before a meal. 09/05/20  Yes Renato Shin, MD  traMADol (ULTRAM) 50 MG tablet Take 50 mg by mouth 2 (two) times daily as needed for moderate pain.   Yes [provider]  AMBULATORY NON [place] [facility] [employer]  Correct leg length and improve pronation BL 10/03/20   Gregor Hams, MD  Elastic Bandages & Supports (RIB BELT/WOMENS MEDIUM) MISC Use as  needed for rib pain 12/19/20   Vivi Barrack, MD  [facility] ULTRA test strip USE TO MONITOR GLUCOSE LEVELS ONCE PER DAY E11.40 04/09/21   Renato Shin, MD    Physical Exam: Vitals:   05/10/21 0839 05/10/21 0915 05/10/21 1019 05/10/21 1110  BP: 125/75 136/78 127/84 131/75  Pulse: 64 64 73 64  Resp: 10 13 18  17  Temp:   97.9 F (36.6 C) 98.1 F (36.7 C)  TempSrc:   Oral Oral  SpO2: 98% 98% 98% 100%     Vitals:   05/10/21 0839 05/10/21 0915 05/10/21 1019 05/10/21 1110  BP: 125/75 136/78 127/84 131/75  Pulse: 64 64 73 64  Resp: 10 13 18 17   Temp:   97.9 F (36.6 C) 98.1 F (36.7 C)  TempSrc:   Oral Oral  SpO2: 98% 98% 98% 100%      Constitutional: Alert and oriented x 3 . Not in any apparent distress HEENT:      Head: Normocephalic and atraumatic.         Eyes: PERLA, EOMI, Conjunctivae are normal. Sclera is non-icteric.       Mouth/Throat: Mucous membranes are dry       Neck: Supple with no signs of meningismus. Cardiovascular: Regular rate and rhythm. No murmurs, gallops, or rubs. 2+ symmetrical distal pulses are present . No JVD. No LE edema Respiratory: Respiratory effort normal .Lungs sounds clear bilaterally. No wheezes, crackles, or rhonchi.  Gastrointestinal: Soft, epigastric tenderness, and non distended with positive bowel sounds.  Genitourinary: No CVA tenderness. Musculoskeletal: Nontender with normal range of motion in all extremities. No cyanosis, or erythema of extremities. Neurologic:  Face is symmetric. Moving all extremities. No gross focal neurologic deficits . Skin: Skin is warm, dry.  No rash or ulcers Psychiatric: Mood and affect are normal    Labs on Admission: I have personally reviewed following labs and imaging studies  CBC: Recent Labs  Lab 05/09/21 1933  WBC 2.6*  2.7*  NEUTROABS 1.9  HGB 12.9  12.9  HCT 40.7  40.4  MCV 100.0  99.3  PLT 140*  947*   Basic Metabolic Panel: Recent Labs  Lab 05/09/21 1933  NA 140  K 4.3  CL  104  CO2 27  GLUCOSE 120*  BUN 22  CREATININE 1.11*  CALCIUM 9.6   GFR: CrCl cannot be calculated (Unknown ideal weight.). Liver Function Tests: Recent Labs  Lab 05/09/21 1933  AST 788*  ALT 1,506*  ALKPHOS 263*  BILITOT 1.1  PROT 6.0*  ALBUMIN 3.8   Recent Labs  Lab 05/09/21 1933  LIPASE 31   No results for input(s): AMMONIA in the last 168 hours. Coagulation Profile: No results for input(s): INR, PROTIME in the last 168 hours. Cardiac Enzymes: No results for input(s): CKTOTAL, CKMB, CKMBINDEX, TROPONINI in the last 168 hours. BNP (last 3 results) No results for input(s): PROBNP in the last 8760 hours. HbA1C: No results for input(s): HGBA1C in the last 72 hours. CBG: Recent Labs  Lab 05/09/21 2142 05/10/21 0848 05/10/21 1308  GLUCAP 95 81 69*   Lipid Profile: No results for input(s): CHOL, HDL, LDLCALC, TRIG, CHOLHDL, LDLDIRECT in the last 72 hours. Thyroid Function Tests: No results for input(s): TSH, T4TOTAL, FREET4, T3FREE, THYROIDAB in the last 72 hours. Anemia Panel: No results for input(s): VITAMINB12, FOLATE, FERRITIN, TIBC, IRON, RETICCTPCT in the last 72 hours. Urine analysis:    Component Value Date/Time   COLORURINE YELLOW 05/09/2021 1933   APPEARANCEUR CLEAR 05/09/2021 1933   LABSPEC 1.030 05/09/2021 1933   PHURINE 5.5 05/09/2021 1933   GLUCOSEU >1,000 (A) 05/09/2021 1933   GLUCOSEU >=1000 (A) 01/11/2017 0933   HGBUR NEGATIVE 05/09/2021 1933   HGBUR negative 12/02/2009 0841   BILIRUBINUR NEGATIVE 05/09/2021 1933   KETONESUR TRACE (A) 05/09/2021 1933   PROTEINUR 30 (A) 05/09/2021 1933   UROBILINOGEN 0.2 01/11/2017 0933  NITRITE NEGATIVE 05/09/2021 1933   LEUKOCYTESUR NEGATIVE 05/09/2021 1933    Radiological Exams on Admission: CT Abdomen Pelvis W Contrast  Result Date: 05/09/2021 CLINICAL DATA:  65 year old female with abdominal pain and fever. EXAM: CT ABDOMEN AND PELVIS WITH CONTRAST TECHNIQUE: Multidetector CT imaging of the  abdomen and pelvis was performed using the standard protocol following bolus administration of intravenous contrast. CONTRAST:  15m OMNIPAQUE IOHEXOL 300 MG/ML  SOLN COMPARISON:  CT abdomen pelvis dated 12/16/2020. FINDINGS: Lower chest: The visualized lung bases are clear. There is coronary vascular calcification. Small calcified granuloma in the lingula. No intra-abdominal free air or free fluid. Hepatobiliary: Probable fatty liver. There is mild intrahepatic biliary dilatation. There is a cluster of sludge or small stones in the gallbladder. The gallbladder is distended. Probable small pericholecystic fluid. Ultrasound may provide better evaluation if there is high clinical concern for acute cholecystitis. Pancreas: Unremarkable. No pancreatic ductal dilatation or surrounding inflammatory changes. Spleen: Small scattered calcified splenic granuloma. Adrenals/Urinary Tract: The adrenal glands are unremarkable. Mild left renal parenchyma atrophy. There is a 2 cm cyst from the posterior interpolar right kidney. There is no hydronephrosis on either side. There is symmetric enhancement and excretion of contrast by both kidneys. The visualized ureters and urinary bladder appear unremarkable. Stomach/Bowel: Postsurgical changes of gastric bypass. There is no bowel obstruction or active inflammation. The appendix is not visualized with certainty. No inflammatory changes identified in the right lower quadrant. Vascular/Lymphatic: Mild aortoiliac atherosclerotic disease. The IVC is unremarkable. No portal venous gas. There is no adenopathy. Reproductive: Hysterectomy. No adnexal masses. Other: Anterior pelvic wall C-section scar. Musculoskeletal: Osteopenia with degenerative changes of the spine. There is acute appearance compression fracture of superior endplate of L4 with approximately 20% loss of vertebral body height. Correlation with clinical exam and point tenderness recommended. No retropulsion. IMPRESSION: 1.  Acute compression fracture of superior endplate of L4. No retropulsion. 2. Gallstone/sludge with probable small pericholecystic fluid. Ultrasound may provide better evaluation if there is high clinical concern for acute cholecystitis. 3. Aortic Atherosclerosis (ICD10-I70.0). Electronically Signed   By: AAnner CreteM.D.   On: 05/09/2021 22:53   UKoreaAbdomen Limited  Result Date: 05/10/2021 CLINICAL DATA:  Fever.  Elevated LFTs, abnormal CT EXAM: ULTRASOUND ABDOMEN LIMITED RIGHT UPPER QUADRANT COMPARISON:  CT 05/09/2021 FINDINGS: Gallbladder: Gallbladder is distended. Multiple mobile stones and sludge within the gallbladder. Gallbladder wall is mildly thickened at 4 mm. Trace pericholecystic fluid. Negative sonographic Murphy's. Common bile duct: Diameter: Mildly dilated, 10 mm. The distal duct is poorly visualized due to overlying bowel gas. Liver: No focal lesion identified. Within normal limits in parenchymal echogenicity. Portal vein is patent on color Doppler imaging with normal direction of blood flow towards the liver. Other: None. IMPRESSION: Cholelithiasis. Gallbladder distension. Sludge present as well as mild wall thickening and trace pericholecystic fluid. Recommend clinical correlation to exclude acute cholecystitis. Mildly dilated common bile duct, 10 mm. Distal duct cannot be visualized due to overlying bowel gas. Electronically Signed   By: KRolm BaptiseM.D.   On: 05/10/2021 01:16   DG Chest Port 1 View  Result Date: 05/09/2021 CLINICAL DATA:  Chest pain, dyspnea EXAM: PORTABLE CHEST 1 VIEW COMPARISON:  12/16/2020 FINDINGS: Lungs are clear. No pneumothorax or pleural effusion. Cardiac size within normal limits. Pulmonary vascularity is normal. Numerous surgical clips are again seen at the thoracic inlet likely related to prior bright red ectomy. Remote right seventh rib healed fracture noted. No acute bone abnormality. IMPRESSION: No active disease. Electronically  Signed   By: Fidela Salisbury  MD   On: 05/09/2021 23:06     Assessment/Plan Principal Problem:   Abdominal pain, epigastric Active Problems:   Type 2 diabetes mellitus with diabetic neuropathy, unspecified (HCC)   GERD (gastroesophageal reflux disease)   Coronary artery disease involving native coronary artery of native heart without angina pectoris   Depression   Cholelithiasis      Abdominal pain rule out acute cholecystitis Patient presents for evaluation of a 3-day history of abdominal pain mostly in the epigastrium associated with fever, chills, poor oral intake and changes in her bowel habits.  Patient has had small, loose, chalky stools. Labs show marked transaminitis.  I will send results of hepatitis profile, Tylenol level as well as CK level Gallbladder ultrasound shows mildly distended common bile duct with thickening of the gallbladder wall Will order an MRCP We will consult GI We will start patient empirically on Zosyn for presumed acute cholecystitis Will request surgery consult     Diabetes mellitus with complications of hypoglycemia and diabetic neuropathy Secondary to poor oral intake Place patient on dextrose containing fluids Hold insulin and oral hypoglycemic agents Check blood sugars every 4 hours Continue gabapentin    GERD Will place patient on IV Protonix    Acute compression fracture of L4 Status post fall about 3 weeks ago Continue pain control Place patient on fall precautions  DVT prophylaxis: SCD  Code Status: full code Family Communication: Greater than 50% of time was spent discussing plan of care with patient at the bedside. All questions and concerns have been addressed.  She verbalizes understanding and agrees with the plan of care Disposition Plan: Back to previous home environment Consults called: Gastroenterology/surgery Status: At the time of admission, it appears that the appropriate admission status for this patient is inpatient.   This is judged to be  reasonable and necessary to provide the required intensity of service to ensure the patient's safety given the presenting symptoms, physical exam findings, and initial radiographic and laboratory data in the context of the comorbid conditions. Patient requires inpatient status due to high intensity of service, high risk for further deterioration and high frequency of surveillance required.    Collier Bullock MD [facility]     05/10/2021, 1:34 PM

## 2021-05-10 NOTE — Plan of Care (Signed)

## 2021-05-10 NOTE — ED Notes (Signed)
Pt went to bathroom ambulatory

## 2021-05-10 NOTE — ED Notes (Signed)
Pt reports mild headache.  States she does not feel she needs any pain medication for that yet, but that sometimes when her sugars get low she gets a headache.

## 2021-05-10 NOTE — ED Notes (Addendum)
Pt updated on bed assignment. Pt ambulated to restroom w/o difficulty or incident.

## 2021-05-11 ENCOUNTER — Inpatient Hospital Stay (HOSPITAL_COMMUNITY): Payer: Medicare Other | Admitting: Certified Registered"

## 2021-05-11 ENCOUNTER — Encounter (HOSPITAL_COMMUNITY)
Admission: EM | Disposition: A | Discharge status: Home / Self Care | Payer: Self-pay | Source: Home / Self Care | Attending: Internal Medicine

## 2021-05-11 ENCOUNTER — Inpatient Hospital Stay (HOSPITAL_COMMUNITY): Payer: Medicare Other

## 2021-05-11 ENCOUNTER — Encounter (HOSPITAL_COMMUNITY): Payer: Self-pay | Admitting: Internal Medicine

## 2021-05-11 DIAGNOSIS — R509 Fever, unspecified: Secondary | ICD-10-CM

## 2021-05-11 DIAGNOSIS — K8000 Calculus of gallbladder with acute cholecystitis without obstruction: Secondary | ICD-10-CM

## 2021-05-11 DIAGNOSIS — I2510 Atherosclerotic heart disease of native coronary artery without angina pectoris: Secondary | ICD-10-CM

## 2021-05-11 HISTORY — PX: CHOLECYSTECTOMY: SHX55

## 2021-05-11 LAB — COMPREHENSIVE METABOLIC PANEL
ALT: 673 U/L — ABNORMAL HIGH (ref 0–44)
AST: 162 U/L — ABNORMAL HIGH (ref 15–41)
Albumin: 3 g/dL — ABNORMAL LOW (ref 3.5–5.0)
Alkaline Phosphatase: 185 U/L — ABNORMAL HIGH (ref 38–126)
Anion gap: 9 (ref 5–15)
BUN: 12 mg/dL (ref 8–23)
CO2: 23 mmol/L (ref 22–32)
Calcium: 8.5 mg/dL — ABNORMAL LOW (ref 8.9–10.3)
Chloride: 108 mmol/L (ref 98–111)
Creatinine, Ser: 1 mg/dL (ref 0.44–1.00)
GFR, Estimated: >60 mL/min (ref >60)
Glucose, Bld: 90 mg/dL (ref 70–99)
Potassium: 4 mmol/L (ref 3.5–5.1)
Sodium: 140 mmol/L (ref 135–145)
Total Bilirubin: 1.2 mg/dL (ref 0.3–1.2)
Total Protein: 5.3 g/dL — ABNORMAL LOW (ref 6.5–8.1)

## 2021-05-11 LAB — CBC
HCT: 36.1 % (ref 36.0–46.0)
HCT: 40.6 % (ref 36.0–46.0)
Hemoglobin: 11.4 g/dL — ABNORMAL LOW (ref 12.0–15.0)
Hemoglobin: 12.8 g/dL (ref 12.0–15.0)
MCH: 31.9 pg (ref 26.0–34.0)
MCH: 32.5 pg (ref 26.0–34.0)
MCHC: 31.6 g/dL (ref 30.0–36.0)
MCHC: 31.5 g/dL (ref 30.0–36.0)
MCV: 101.1 fL — ABNORMAL HIGH (ref 80.0–100.0)
MCV: 103 fL — ABNORMAL HIGH (ref 80.0–100.0)
Platelets: 112 10*3/uL — ABNORMAL LOW (ref 150–400)
Platelets: 111 10*3/uL — ABNORMAL LOW (ref 150–400)
RBC: 3.57 MIL/uL — ABNORMAL LOW (ref 3.87–5.11)
RBC: 3.94 MIL/uL (ref 3.87–5.11)
RDW: 13.5 % (ref 11.5–15.5)
RDW: 13.3 % (ref 11.5–15.5)
WBC: 2.3 10*3/uL — ABNORMAL LOW (ref 4.0–10.5)
WBC: 3.8 10*3/uL — ABNORMAL LOW (ref 4.0–10.5)
nRBC: 0 % (ref 0.0–0.2)
nRBC: 0 % (ref 0.0–0.2)

## 2021-05-11 LAB — GLUCOSE, CAPILLARY
Glucose-Capillary: 108 mg/dL — ABNORMAL HIGH (ref 70–99)
Glucose-Capillary: 142 mg/dL — ABNORMAL HIGH (ref 70–99)
Glucose-Capillary: 186 mg/dL — ABNORMAL HIGH (ref 70–99)
Glucose-Capillary: 233 mg/dL — ABNORMAL HIGH (ref 70–99)
Glucose-Capillary: 75 mg/dL (ref 70–99)
Glucose-Capillary: 86 mg/dL (ref 70–99)

## 2021-05-11 LAB — CREATININE, SERUM
Creatinine, Ser: 1.05 mg/dL — ABNORMAL HIGH (ref 0.44–1.00)
GFR, Estimated: 58 mL/min — ABNORMAL LOW (ref >60)

## 2021-05-11 LAB — SURGICAL PCR SCREEN
MRSA, PCR: NEGATIVE
Staphylococcus aureus: NEGATIVE

## 2021-05-11 SURGERY — LAPAROSCOPIC CHOLECYSTECTOMY WITH INTRAOPERATIVE CHOLANGIOGRAM
Anesthesia: General

## 2021-05-11 MED ORDER — PROPOFOL 10 MG/ML IV BOLUS
INTRAVENOUS | Status: DC | PRN
  Administered 2021-05-11: 150 mg via INTRAVENOUS

## 2021-05-11 MED ORDER — FENTANYL CITRATE (PF) 100 MCG/2ML IJ SOLN
12.5000 ug | INTRAMUSCULAR | Status: DC | PRN
  Administered 2021-05-11 – 2021-05-12 (×3): 12.5 ug via INTRAVENOUS
  Filled 2021-05-11 (×3): qty 2

## 2021-05-11 MED ORDER — IOPAMIDOL (ISOVUE-300) INJECTION 61%
INTRAVENOUS | Status: DC | PRN
  Administered 2021-05-11: 7.5 mL

## 2021-05-11 MED ORDER — FENTANYL CITRATE (PF) 100 MCG/2ML IJ SOLN
INTRAMUSCULAR | Status: AC
  Filled 2021-05-11: qty 2

## 2021-05-11 MED ORDER — ROCURONIUM BROMIDE 10 MG/ML (PF) SYRINGE
PREFILLED_SYRINGE | INTRAVENOUS | Status: DC | PRN
  Administered 2021-05-11: 60 mg via INTRAVENOUS
  Administered 2021-05-11: 10 mg via INTRAVENOUS

## 2021-05-11 MED ORDER — KCL IN DEXTROSE-NACL 20-5-0.45 MEQ/L-%-% IV SOLN
INTRAVENOUS | Status: DC
  Filled 2021-05-11: qty 1000

## 2021-05-11 MED ORDER — DEXTROSE-NACL 5-0.45 % IV SOLN: INTRAVENOUS | Status: DC

## 2021-05-11 MED ORDER — FENTANYL CITRATE (PF) 250 MCG/5ML IJ SOLN
INTRAMUSCULAR | Status: AC
  Filled 2021-05-11: qty 5

## 2021-05-11 MED ORDER — ONDANSETRON HCL 4 MG/2ML IJ SOLN
INTRAMUSCULAR | Status: DC | PRN
  Administered 2021-05-11: 4 mg via INTRAVENOUS

## 2021-05-11 MED ORDER — LIDOCAINE 2% (20 MG/ML) 5 ML SYRINGE
INTRAMUSCULAR | Status: DC | PRN
  Administered 2021-05-11: 60 mg via INTRAVENOUS

## 2021-05-11 MED ORDER — HEPARIN SODIUM (PORCINE) 5000 UNIT/ML IJ SOLN
5000.0000 [IU] | Freq: Three times a day (TID) | INTRAMUSCULAR | Status: DC
  Administered 2021-05-11 – 2021-05-13 (×5): 5000 [IU] via SUBCUTANEOUS
  Filled 2021-05-11 (×5): qty 1

## 2021-05-11 MED ORDER — PHENYLEPHRINE HCL (PRESSORS) 10 MG/ML IV SOLN
INTRAVENOUS | Status: AC
  Filled 2021-05-11: qty 1

## 2021-05-11 MED ORDER — GLYCOPYRROLATE PF 0.2 MG/ML IJ SOSY
PREFILLED_SYRINGE | INTRAMUSCULAR | Status: DC | PRN
  Administered 2021-05-11: .2 mg via INTRAVENOUS

## 2021-05-11 MED ORDER — PROPOFOL 10 MG/ML IV BOLUS
INTRAVENOUS | Status: AC
  Filled 2021-05-11: qty 20

## 2021-05-11 MED ORDER — ROCURONIUM BROMIDE 10 MG/ML (PF) SYRINGE
PREFILLED_SYRINGE | INTRAVENOUS | Status: AC
  Filled 2021-05-11: qty 10

## 2021-05-11 MED ORDER — CHLORHEXIDINE GLUCONATE 0.12 % MT SOLN
15.0000 mL | Freq: Once | OROMUCOSAL | Status: AC
  Administered 2021-05-11: 15 mL via OROMUCOSAL

## 2021-05-11 MED ORDER — LIDOCAINE 2% (20 MG/ML) 5 ML SYRINGE
INTRAMUSCULAR | Status: AC
  Filled 2021-05-11: qty 5

## 2021-05-11 MED ORDER — ONDANSETRON 4 MG PO TBDP: 4.0000 mg | ORAL_TABLET | Freq: Four times a day (QID) | ORAL | Status: DC | PRN

## 2021-05-11 MED ORDER — PANTOPRAZOLE SODIUM 40 MG IV SOLR
40.0000 mg | Freq: Every day | INTRAVENOUS | Status: DC
  Administered 2021-05-12: 40 mg via INTRAVENOUS
  Filled 2021-05-11: qty 40

## 2021-05-11 MED ORDER — ONDANSETRON HCL 4 MG/2ML IJ SOLN: 4.0000 mg | Freq: Four times a day (QID) | INTRAMUSCULAR | Status: DC | PRN

## 2021-05-11 MED ORDER — DEXAMETHASONE SODIUM PHOSPHATE 10 MG/ML IJ SOLN
INTRAMUSCULAR | Status: AC
  Filled 2021-05-11: qty 1

## 2021-05-11 MED ORDER — RINGERS IRRIGATION IR SOLN
Status: DC | PRN
  Administered 2021-05-11: 1

## 2021-05-11 MED ORDER — BUPIVACAINE LIPOSOME 1.3 % IJ SUSP
20.0000 mL | Freq: Once | INTRAMUSCULAR | Status: DC
  Filled 2021-05-11 (×2): qty 20

## 2021-05-11 MED ORDER — PROPOFOL 10 MG/ML IV BOLUS: INTRAVENOUS | Status: DC | PRN

## 2021-05-11 MED ORDER — LACTATED RINGERS IV SOLN: INTRAVENOUS | Status: DC

## 2021-05-11 MED ORDER — MIDAZOLAM HCL 2 MG/2ML IJ SOLN
INTRAMUSCULAR | Status: AC
  Filled 2021-05-11: qty 2

## 2021-05-11 MED ORDER — PHENYLEPHRINE HCL-NACL 10-0.9 MG/250ML-% IV SOLN
INTRAVENOUS | Status: DC | PRN
  Administered 2021-05-11: 20 ug/min via INTRAVENOUS

## 2021-05-11 MED ORDER — FENTANYL CITRATE (PF) 100 MCG/2ML IJ SOLN
25.0000 ug | INTRAMUSCULAR | Status: DC | PRN
  Administered 2021-05-11 (×3): 50 ug via INTRAVENOUS

## 2021-05-11 MED ORDER — GADOBUTROL 1 MMOL/ML IV SOLN
7.5000 mL | Freq: Once | INTRAVENOUS | Status: AC | PRN
  Administered 2021-05-11: 7.5 mL via INTRAVENOUS

## 2021-05-11 MED ORDER — METOPROLOL TARTRATE 5 MG/5ML IV SOLN: 5.0000 mg | Freq: Four times a day (QID) | INTRAVENOUS | Status: DC | PRN

## 2021-05-11 MED ORDER — MIDAZOLAM HCL 5 MG/5ML IJ SOLN
INTRAMUSCULAR | Status: DC | PRN
  Administered 2021-05-11: 2 mg via INTRAVENOUS

## 2021-05-11 MED ORDER — FENTANYL CITRATE (PF) 100 MCG/2ML IJ SOLN
INTRAMUSCULAR | Status: DC | PRN
  Administered 2021-05-11: 100 ug via INTRAVENOUS
  Administered 2021-05-11: 25 ug via INTRAVENOUS
  Administered 2021-05-11: 50 ug via INTRAVENOUS

## 2021-05-11 MED ORDER — MUPIROCIN 2 % EX OINT
1.0000 "application " | TOPICAL_OINTMENT | Freq: Two times a day (BID) | CUTANEOUS | Status: DC
  Administered 2021-05-11 – 2021-05-13 (×4): 1 via NASAL
  Filled 2021-05-11: qty 22

## 2021-05-11 MED ORDER — DEXAMETHASONE SODIUM PHOSPHATE 10 MG/ML IJ SOLN
INTRAMUSCULAR | Status: DC | PRN
  Administered 2021-05-11: 10 mg via INTRAVENOUS

## 2021-05-11 MED ORDER — SUGAMMADEX SODIUM 200 MG/2ML IV SOLN
INTRAVENOUS | Status: DC | PRN
  Administered 2021-05-11: 200 mg via INTRAVENOUS

## 2021-05-11 MED ORDER — SUCCINYLCHOLINE CHLORIDE 200 MG/10ML IV SOSY
PREFILLED_SYRINGE | INTRAVENOUS | Status: AC
  Filled 2021-05-11: qty 10

## 2021-05-11 MED ORDER — BUPIVACAINE LIPOSOME 1.3 % IJ SUSP
INTRAMUSCULAR | Status: DC | PRN
  Administered 2021-05-11: 20 mL

## 2021-05-11 MED ORDER — ONDANSETRON HCL 4 MG/2ML IJ SOLN
INTRAMUSCULAR | Status: AC
  Filled 2021-05-11: qty 2

## 2021-05-11 SURGICAL SUPPLY — 52 items
ADH SKN CLS APL DERMABOND .7 (GAUZE/BANDAGES/DRESSINGS) ×1
APL SKNCLS STERI-STRIP NONHPOA (GAUZE/BANDAGES/DRESSINGS) ×1
APL SWBSTK 6 STRL LF DISP (MISCELLANEOUS) ×2
APPLICATOR COTTON TIP 6 STRL (MISCELLANEOUS) ×2 IMPLANT
APPLICATOR COTTON TIP 6IN STRL (MISCELLANEOUS) ×4 IMPLANT
APPLIER CLIP 5 13 M/L LIGAMAX5 (MISCELLANEOUS)
APPLIER CLIP ROT 10 11.4 M/L (STAPLE) ×2
APR CLP MED LRG 11.4X10 (STAPLE) ×1
APR CLP MED LRG 5 ANG JAW (MISCELLANEOUS)
BAG COUNTER SPONGE SURGICOUNT (BAG) IMPLANT
BAG SPEC RTRVL 10 TROC 200 (ENDOMECHANICALS) ×1
BAG SPNG CNTER NS LX DISP (BAG)
BENZOIN TINCTURE PRP APPL 2/3 (GAUZE/BANDAGES/DRESSINGS) ×1 IMPLANT
CABLE HIGH FREQUENCY MONO STRZ (ELECTRODE) IMPLANT
CATH REDDICK CHOLANGI 4FR 50CM (CATHETERS) ×2 IMPLANT
CLIP APPLIE 5 13 M/L LIGAMAX5 (MISCELLANEOUS) IMPLANT
CLIP APPLIE ROT 10 11.4 M/L (STAPLE) IMPLANT
COVER MAYO STAND STRL (DRAPES) ×2 IMPLANT
COVER SURGICAL LIGHT HANDLE (MISCELLANEOUS) ×2 IMPLANT
DECANTER SPIKE VIAL GLASS SM (MISCELLANEOUS) ×2 IMPLANT
DERMABOND ADVANCED (GAUZE/BANDAGES/DRESSINGS) ×1
DERMABOND ADVANCED .7 DNX12 (GAUZE/BANDAGES/DRESSINGS) ×1 IMPLANT
DRAPE C-ARM 42X120 X-RAY (DRAPES) ×2 IMPLANT
DRSG TEGADERM 2-3/8X2-3/4 SM (GAUZE/BANDAGES/DRESSINGS) ×4 IMPLANT
ELECT L-HOOK LAP 45CM DISP (ELECTROSURGICAL) ×2
ELECT PENCIL ROCKER SW 15FT (MISCELLANEOUS) IMPLANT
ELECT REM PT RETURN 15FT ADLT (MISCELLANEOUS) ×2 IMPLANT
ELECTRODE L-HOOK LAP 45CM DISP (ELECTROSURGICAL) IMPLANT
GLOVE SURG ENC TEXT LTX SZ8 (GLOVE) ×2 IMPLANT
GOWN STRL REUS W/TWL XL LVL3 (GOWN DISPOSABLE) ×2 IMPLANT
GRASPER SUT TROCAR 14GX15 (MISCELLANEOUS) ×1 IMPLANT
HEMOSTAT SURGICEL 2X3 (HEMOSTASIS) ×1 IMPLANT
HEMOSTAT SURGICEL 4X8 (HEMOSTASIS) IMPLANT
IV CATH 14GX2 1/4 (CATHETERS) ×2 IMPLANT
KIT BASIN OR (CUSTOM PROCEDURE TRAY) ×2 IMPLANT
KIT TURNOVER KIT A (KITS) ×2 IMPLANT
POUCH RETRIEVAL ECOSAC 10 (ENDOMECHANICALS) IMPLANT
POUCH RETRIEVAL ECOSAC 10MM (ENDOMECHANICALS) ×2
SCISSORS LAP 5X45 EPIX DISP (ENDOMECHANICALS) ×2 IMPLANT
SET IRRIG TUBING LAPAROSCOPIC (IRRIGATION / IRRIGATOR) ×2 IMPLANT
SET TUBE SMOKE EVAC HIGH FLOW (TUBING) ×2 IMPLANT
SLEEVE XCEL OPT CAN 5 100 (ENDOMECHANICALS) ×2 IMPLANT
SPONGE GAUZE 2X2 8PLY STRL LF (GAUZE/BANDAGES/DRESSINGS) ×1 IMPLANT
STRIP CLOSURE SKIN 1/2X4 (GAUZE/BANDAGES/DRESSINGS) IMPLANT
SUT MNCRL AB 4-0 PS2 18 (SUTURE) ×4 IMPLANT
SYR 20ML LL LF (SYRINGE) ×2 IMPLANT
TAPE STRIPS DRAPE STRL (GAUZE/BANDAGES/DRESSINGS) ×1 IMPLANT
TOWEL OR 17X26 10 PK STRL BLUE (TOWEL DISPOSABLE) ×2 IMPLANT
TRAY LAPAROSCOPIC (CUSTOM PROCEDURE TRAY) ×2 IMPLANT
TROCAR BLADELESS OPT 5 100 (ENDOMECHANICALS) ×2 IMPLANT
TROCAR XCEL BLUNT TIP 100MML (ENDOMECHANICALS) IMPLANT
TROCAR XCEL NON-BLD 11X100MML (ENDOMECHANICALS) ×1 IMPLANT

## 2021-05-11 NOTE — H&P (View-Only) (Signed)
Seen by my attending this am. Please see his progress note for more details. MRCP negative for Choledocholithiasis. Dr. Hassell Done recommends Laparoscopic Cholecystectomy.  I have explained the procedure, risks, and aftercare of Laparoscopic cholecystectomy.  Risks include but are not limited to anesthesia (MI, CVA, death), bleeding, infection, wound problems, hernia, bile leak, injury to common bile duct/liver/intestine and diarrhea post op.  She seems to understand and agrees to proceed.   Alferd Apa, [facility] 2:20 PM, 05/11/2021

## 2021-05-11 NOTE — Anesthesia Procedure Notes (Signed)
Procedure Name: Intubation Date/Time: 05/11/2021 3:18 PM Performed by: Cleda Daub, CRNA Pre-anesthesia Checklist: Patient identified, Emergency Drugs available, Suction available and Patient being monitored Patient Re-evaluated:Patient Re-evaluated prior to induction Oxygen Delivery Method: Circle system utilized Preoxygenation: Pre-oxygenation with 100% oxygen Induction Type: IV induction Ventilation: Mask ventilation without difficulty Laryngoscope Size: Mac and 3 Grade View: Grade I Tube type: Oral Tube size: 7.0 mm Number of attempts: 1 Airway Equipment and Method: Stylet and Oral airway Placement Confirmation: ETT inserted through vocal cords under direct vision, positive ETCO2 and breath sounds checked- equal and bilateral Secured at: 21 cm Tube secured with: Tape Dental Injury: Teeth and Oropharynx as per pre-operative assessment

## 2021-05-11 NOTE — Interval H&P Note (Signed)
History and Physical Interval Note:  05/11/2021 3:00 PM  Savannah [name]  has presented today for surgery, with the diagnosis of acute cholecysititis.  The various methods of treatment have been discussed with the patient and family. After consideration of risks, benefits and other options for treatment, the patient has consented to  Procedure(s): LAPAROSCOPIC CHOLECYSTECTOMY WITH POSSIBLE  INTRAOPERATIVE CHOLANGIOGRAM (N/A) as a surgical intervention.  The patient's history has been reviewed, patient examined, no change in status, stable for surgery.  I have reviewed the patient's chart and labs.  Questions were answered to the patient's satisfaction.     Pedro Earls

## 2021-05-11 NOTE — Consult Note (Signed)
Chief Complaint:  abdominal pain and gallstones  History of Present Illness:  Savannah [name] is an 65 y.o. female had roux en Y gastric bypass by the group in [place] ~ 10 years ago.  Presented with abdominal pain, elevated transaminases and has MRCP pending.  If this is + for stones, we can coordinate laparoscopic transgastric ERCP followed by lap chole. However, if MRCP is negative would plan lap chole with IOC.    Past Medical History:  Diagnosis Date   ABDOMINAL PAIN, CHRONIC 10/20/2009   Acute cystitis 08/17/2009   ALLERGIC RHINITIS 11/21/2007   Allergy    ASYMPTOMATIC POSTMENOPAUSAL STATUS 09/29/2008   Bariatric surgery status 07/07/2010   Bariatric surgery status 07/07/2010   Qualifier: Diagnosis of  By: Loanne Drilling MD, Sean A    Cancer [facility])    uterine   Cataract    Coronary artery disease    DEPRESSION 11/21/2007   DIABETES MELLITUS, TYPE II 07/08/2007   DYSPNEA 05/20/2009   Edema 05/17/2262   Eosinophilic esophagitis 01/13/5455   FEVER UNSPECIFIED 10/20/2009   FEVER, HX OF 03/09/2010   GERD (gastroesophageal reflux disease)    Headache(784.0) 02/06/2010   Hepatomegaly 01/06/2008   HYPERLIPIDEMIA 07/08/2007   HYPERTENSION 07/08/2007   LEUKOPENIA, MILD 09/29/2008   OBESITY 01/06/2008   OSTEOARTHRITIS 01/06/2008   Other chronic nonalcoholic liver disease 2/56/3893   PERIPHERAL NEUROPATHY 11/21/2007   Peripheral neuropathy    Postsurgical hypothyroidism 09/19/2010   SINUSITIS- ACUTE-NOS 11/21/2007   TB SKIN TEST, POSITIVE 02/06/2010   THYROID NODULE 03/09/2010    Past Surgical History:  Procedure Laterality Date   ABDOMINAL HYSTERECTOMY     BASAL CELL CARCINOMA EXCISION     CARDIAC CATHETERIZATION     COLONOSCOPY     EYE SURGERY Bilateral 08/2018, 09/2018   FOOT SURGERY Left    10/2019   KNEE ARTHROSCOPY Left    LEFT HEART CATH AND CORONARY ANGIOGRAPHY N/A 01/20/2019   Procedure: LEFT HEART CATH AND CORONARY ANGIOGRAPHY;  Surgeon: Belva Crome, MD;  Location: [facility];  Service:  Cardiovascular;  Laterality: N/A;   LYMPH NODE DISSECTION     OOPHORECTOMY     Right total knee replacement     ROUX-EN-Y GASTRIC BYPASS  2011   SKIN TAG REMOVAL     THYROIDECTOMY     TONSILLECTOMY AND ADENOIDECTOMY     UPPER GASTROINTESTINAL ENDOSCOPY      Current Facility-Administered Medications  Medication Dose Route Frequency Provider Last Rate Last Admin   cyclobenzaprine (FLEXERIL) tablet 10 mg  10 mg Oral QHS PRN Agbata, Tochukwu, MD       dextrose 5 %-0.45 % sodium chloride infusion   Intravenous Continuous Rai, Ripudeep K, MD 100 mL/hr at 05/11/21 0814 New Bag at 05/11/21 0814   gabapentin (NEURONTIN) capsule 1,200 mg  1,200 mg Oral TID Agbata, Tochukwu, MD   1,200 mg at 05/10/21 2318   levothyroxine (SYNTHROID) tablet 112 mcg  112 mcg Oral QAC breakfast Agbata, Tochukwu, MD   112 mcg at 05/11/21 0542   loratadine (CLARITIN) tablet 10 mg  10 mg Oral Daily Agbata, Tochukwu, MD   10 mg at 05/10/21 1550   morphine 2 MG/ML injection 2 mg  2 mg Intravenous Q4H PRN Agbata, Tochukwu, MD       nitroGLYCERIN (NITROSTAT) SL tablet 0.4 mg  0.4 mg Sublingual Q5 min PRN Agbata, Tochukwu, MD       ondansetron (ZOFRAN) tablet 4 mg  4 mg Oral Q6H PRN Agbata, Tochukwu,  MD       Or   ondansetron (ZOFRAN) injection 4 mg  4 mg Intravenous Q6H PRN Agbata, Tochukwu, MD       pantoprazole (PROTONIX) injection 40 mg  40 mg Intravenous Daily Agbata, Tochukwu, MD   40 mg at 05/10/21 1553   piperacillin-tazobactam (ZOSYN) IVPB 3.375 g  3.375 g Intravenous Q8H Agbata, Tochukwu, MD 12.5 mL/hr at 05/11/21 0544 3.375 g at 05/11/21 0544   Ace inhibitors, Caffeine, Ciprofloxacin, Morphine and related, Mucinex [guaifenesin er], Nsaids, Other, Silicone, and Tape Family History  Problem Relation Age of Onset   Multiple sclerosis Sister    Breast cancer Sister        breast onset age 49   Osteoporosis Sister    Diabetes Mother    Diabetes Father    Congestive Heart Failure Father    Hypertension Father     Myelodysplastic syndrome Father    Liver cancer Maternal Aunt    Breast cancer Maternal Aunt    Diabetes Maternal Aunt    Colon cancer Maternal Uncle    Diabetes Maternal Uncle    Diabetes Maternal Grandmother    Diabetes Paternal Grandmother    Social History:   reports that she quit smoking about 30 years ago. Her smoking use included cigarettes. She has a 22.50 pack-year smoking history. She has never used smokeless tobacco. She reports current alcohol use. She reports that she does not use drugs.   REVIEW OF SYSTEMS : Negative except for see problem list  Physical Exam:   Blood pressure (!) 145/84, pulse 65, temperature 97.7 F (36.5 C), temperature source Oral, resp. rate 14, height 5' 11"  (1.803 m), weight 75 kg, SpO2 95 %. Body mass index is 23.06 kg/m.  Gen:  WDWN WF NAD  Neurological: Alert and oriented to person, place, and time. Motor and sensory function is grossly intact  Head: Normocephalic and atraumatic.  Eyes: Conjunctivae are normal. Pupils are equal, round, and reactive to light. No scleral icterus.  Neck: Normal range of motion. Neck supple. No tracheal deviation or thyromegaly present.  Cardiovascular:  SR without murmurs or gallops.  No carotid bruits Breast:  not examined Respiratory: Effort normal.  No respiratory distress. No chest wall tenderness. Breath sounds normal.  No wheezes, rales or rhonchi.  Abdomen:  nontender GU:  not examined Musculoskeletal: Normal range of motion. Extremities are nontender. No cyanosis, edema or clubbing noted Lymphadenopathy: No cervical, preauricular, postauricular or axillary adenopathy is present Skin: Skin is warm and dry. No rash noted. No diaphoresis. No erythema. No pallor. Pscyh: Normal mood and affect. Behavior is normal. Judgment and thought content normal.   LABORATORY RESULTS: Results for orders placed or performed during the hospital encounter of 05/09/21 (from the past 48 hour(s))  Lipase, blood     Status:  None   Collection Time: 05/09/21  7:33 PM  Result Value Ref Range   Lipase 31 11 - 51 U/L    Comment: Performed at [facility], [address], [place], [place] [address]  Comprehensive metabolic panel     Status: Abnormal   Collection Time: 05/09/21  7:33 PM  Result Value Ref Range   Sodium 140 135 - 145 mmol/L   Potassium 4.3 3.5 - 5.1 mmol/L   Chloride 104 98 - 111 mmol/L   CO2 27 22 - 32 mmol/L   Glucose, Bld 120 (H) 70 - 99 mg/dL    Comment: Glucose reference range applies only to samples taken after fasting for  at least 8 hours.   BUN 22 8 - 23 mg/dL   Creatinine, Ser 1.11 (H) 0.44 - 1.00 mg/dL   Calcium 9.6 8.9 - 10.3 mg/dL   Total Protein 6.0 (L) 6.5 - 8.1 g/dL   Albumin 3.8 3.5 - 5.0 g/dL   AST 788 (H) 15 - 41 U/L   ALT 1,506 (H) 0 - 44 U/L   Alkaline Phosphatase 263 (H) 38 - 126 U/L   Total Bilirubin 1.1 0.3 - 1.2 mg/dL   GFR, Estimated 54 (L) >60 mL/min    Comment: (NOTE) Calculated using the CKD-EPI Creatinine Equation (2021)    Anion gap 9 5 - 15    Comment: Performed at [facility], [address] [place], [place] [address]  CBC     Status: Abnormal   Collection Time: 05/09/21  7:33 PM  Result Value Ref Range   WBC 2.7 (L) 4.0 - 10.5 K/uL   RBC 4.07 3.87 - 5.11 MIL/uL   Hemoglobin 12.9 12.0 - 15.0 g/dL   HCT 40.4 36.0 - 46.0 %   MCV 99.3 80.0 - 100.0 fL   MCH 31.7 26.0 - 34.0 pg   MCHC 31.9 30.0 - 36.0 g/dL   RDW 13.6 11.5 - 15.5 %   Platelets 134 (L) 150 - 400 K/uL   nRBC 0.0 0.0 - 0.2 %    Comment: Performed at [facility], [address], [place], [place] [address]  Urinalysis, Routine w reflex microscopic Urine, Clean Catch     Status: Abnormal   Collection Time: 05/09/21  7:33 PM  Result Value Ref Range   Color, Urine YELLOW YELLOW   APPearance CLEAR CLEAR   Specific Gravity, Urine 1.030 1.005 - 1.030   pH 5.5 5.0 - 8.0   Glucose, UA >1,000 (A) NEGATIVE mg/dL   Hgb urine dipstick  NEGATIVE NEGATIVE   Bilirubin Urine NEGATIVE NEGATIVE   Ketones, ur TRACE (A) NEGATIVE mg/dL   Protein, ur 30 (A) NEGATIVE mg/dL   Nitrite NEGATIVE NEGATIVE   Leukocytes,Ua NEGATIVE NEGATIVE   WBC, UA 0-5 0 - 5 WBC/hpf    Comment: Performed at [facility], [address], [place], [place] [address]  CBC with Differential/Platelet     Status: Abnormal   Collection Time: 05/09/21  7:33 PM  Result Value Ref Range   WBC 2.6 (L) 4.0 - 10.5 K/uL   RBC 4.07 3.87 - 5.11 MIL/uL   Hemoglobin 12.9 12.0 - 15.0 g/dL   HCT 40.7 36.0 - 46.0 %   MCV 100.0 80.0 - 100.0 fL   MCH 31.7 26.0 - 34.0 pg   MCHC 31.7 30.0 - 36.0 g/dL   RDW 13.7 11.5 - 15.5 %   Platelets 140 (L) 150 - 400 K/uL   nRBC 0.0 0.0 - 0.2 %   Neutrophils Relative % 72 %   Neutro Abs 1.9 1.7 - 7.7 K/uL   Lymphocytes Relative 15 %   Lymphs Abs 0.4 (L) 0.7 - 4.0 K/uL   Monocytes Relative 9 %   Monocytes Absolute 0.2 0.1 - 1.0 K/uL   Eosinophils Relative 4 %   Eosinophils Absolute 0.1 0.0 - 0.5 K/uL   Basophils Relative 0 %   Basophils Absolute 0.0 0.0 - 0.1 K/uL   Immature Granulocytes 0 %   Abs Immature Granulocytes 0.01 0.00 - 0.07 K/uL    Comment: Performed at [facility], [address] [place], [place] [address]  CBG monitoring, ED     Status:  None   Collection Time: 05/09/21  9:42 PM  Result Value Ref Range   Glucose-Capillary 95 70 - 99 mg/dL    Comment: Glucose reference range applies only to samples taken after fasting for at least 8 hours.  Lactic acid, plasma     Status: None   Collection Time: 05/09/21 10:11 PM  Result Value Ref Range   Lactic Acid, Venous 0.9 0.5 - 1.9 mmol/L    Comment: Performed at [facility], [address] [place], [place] [address]  Culture, blood (Routine X 2) w Reflex to ID Panel     Status: None (Preliminary result)   Collection Time: 05/09/21 10:20 PM   Specimen: BLOOD  Result Value Ref Range   Specimen Description       BLOOD RIGHT ANTECUBITAL Performed at [facility], [address], [place], [place] [address]    Special Requests      BOTTLES DRAWN AEROBIC AND ANAEROBIC Blood Culture adequate volume Performed at [facility], [address] [place], [place] [address]    Culture      NO GROWTH < 24 HOURS Performed at [facility], [place] [address] [place], [place] [address]    Report Status PENDING   Culture, blood (Routine X 2) w Reflex to ID Panel     Status: None (Preliminary result)   Collection Time: 05/09/21 11:00 PM   Specimen: BLOOD  Result Value Ref Range   Specimen Description      BLOOD BOTTLES DRAWN AEROBIC AND ANAEROBIC Performed at [facility], [address], [place], [place] [address]    Special Requests      BLOOD LEFT FOREARM Blood Culture adequate volume Performed at [facility], [address] [place], [place] [address]    Culture      NO GROWTH < 24 HOURS Performed at [facility], [place] [address], [place], [place] [address]    Report Status PENDING   Hepatitis panel, acute     Status: None   Collection Time: 05/09/21 11:51 PM  Result Value Ref Range   Hepatitis B Surface Ag NON REACTIVE NON REACTIVE   HCV Ab NON REACTIVE NON REACTIVE    Comment: (NOTE) Nonreactive HCV antibody screen is consistent with no HCV infections,  unless recent infection is suspected or other evidence exists to indicate HCV infection.     Hep A IgM NON REACTIVE NON REACTIVE   Hep B C IgM NON REACTIVE NON REACTIVE    Comment: Performed at [facility], [place] [address] [place],  [address]  Resp Panel by RT-PCR (Flu A&B, Covid) Nasopharyngeal Swab     Status: None   Collection Time: 05/10/21  1:46 AM   Specimen: Nasopharyngeal Swab; Nasopharyngeal(NP) swabs in vial transport medium  Result Value Ref Range   SARS Coronavirus 2 by RT PCR NEGATIVE NEGATIVE    Comment:  (NOTE) SARS-CoV-2 target nucleic acids are NOT DETECTED.  The SARS-CoV-2 RNA is generally detectable in upper respiratory specimens during the acute phase of infection. The lowest concentration of SARS-CoV-2 viral copies this assay can detect is 138 copies/mL. A negative result does not preclude SARS-Cov-2 infection and should not be used as the sole basis for treatment or other patient management decisions. A negative result may occur with  improper specimen collection/handling, submission of specimen other than nasopharyngeal swab, presence of viral mutation(s) within the areas targeted by this assay, and inadequate number of viral copies(<138 copies/mL). A  negative result must be combined with clinical observations, patient history, and epidemiological information. The expected result is Negative.  Fact Sheet for Patients:  EntrepreneurPulse.com.au  Fact Sheet for Healthcare Providers:  IncredibleEmployment.be  This test is no t yet approved or cleared by the [place] FDA and  has been authorized for detection and/or diagnosis of SARS-CoV-2 by FDA under an Emergency Use Authorization (EUA). This EUA will remain  in effect (meaning this test can be used) for the duration of the COVID-19 declaration under Section 564(b)(1) of the Act, 21 U.S.C.section 360bbb-3(b)(1), unless the authorization is terminated  or revoked sooner.       Influenza A by PCR NEGATIVE NEGATIVE   Influenza B by PCR NEGATIVE NEGATIVE    Comment: (NOTE) The Xpert Xpress SARS-CoV-2/FLU/RSV plus assay is intended as an aid in the diagnosis of influenza from Nasopharyngeal swab specimens and should not be used as a sole basis for treatment. Nasal washings and aspirates are unacceptable for Xpert Xpress SARS-CoV-2/FLU/RSV testing.  Fact Sheet for Patients: EntrepreneurPulse.com.au  Fact Sheet for Healthcare  Providers: IncredibleEmployment.be  This test is not yet approved or cleared by the [place] FDA and has been authorized for detection and/or diagnosis of SARS-CoV-2 by FDA under an Emergency Use Authorization (EUA). This EUA will remain in effect (meaning this test can be used) for the duration of the COVID-19 declaration under Section 564(b)(1) of the Act, 21 U.S.C. section 360bbb-3(b)(1), unless the authorization is terminated or revoked.  Performed at [facility], [address] [place], [place] [address]   POC CBG, ED     Status: None   Collection Time: 05/10/21  8:48 AM  Result Value Ref Range   Glucose-Capillary 81 70 - 99 mg/dL    Comment: Glucose reference range applies only to samples taken after fasting for at least 8 hours.  Glucose, capillary     Status: Abnormal   Collection Time: 05/10/21  1:08 PM  Result Value Ref Range   Glucose-Capillary 69 (L) 70 - 99 mg/dL    Comment: Glucose reference range applies only to samples taken after fasting for at least 8 hours.  Comprehensive metabolic panel Once     Status: Abnormal   Collection Time: 05/10/21  1:40 PM  Result Value Ref Range   Sodium 139 135 - 145 mmol/L   Potassium 3.9 3.5 - 5.1 mmol/L   Chloride 106 98 - 111 mmol/L   CO2 23 22 - 32 mmol/L   Glucose, Bld 175 (H) 70 - 99 mg/dL    Comment: Glucose reference range applies only to samples taken after fasting for at least 8 hours.   BUN 16 8 - 23 mg/dL   Creatinine, Ser 0.95 0.44 - 1.00 mg/dL   Calcium 8.7 (L) 8.9 - 10.3 mg/dL   Total Protein 5.6 (L) 6.5 - 8.1 g/dL   Albumin 3.1 (L) 3.5 - 5.0 g/dL   AST 328 (H) 15 - 41 U/L   ALT 950 (H) 0 - 44 U/L   Alkaline Phosphatase 207 (H) 38 - 126 U/L   Total Bilirubin 0.8 0.3 - 1.2 mg/dL   GFR, Estimated >60 >60 mL/min    Comment: (NOTE) Calculated using the CKD-EPI Creatinine Equation (2021)    Anion gap 10 5 - 15    Comment: Performed at [facility],  [place] [address], [place], [place] [address]  CK     Status: Abnormal   Collection Time: 05/10/21  1:45 PM  Result Value  Ref Range   Total CK 21 (L) 38 - 234 U/L    Comment: Performed at [facility], [place] [address] [place], [place] [address]  Acetaminophen level     Status: Abnormal   Collection Time: 05/10/21  1:45 PM  Result Value Ref Range   Acetaminophen (Tylenol), Serum <10 (L) 10 - 30 ug/mL    Comment: (NOTE) Therapeutic concentrations vary significantly. A range of 10-30 ug/mL  may be an effective concentration for many patients. However, some  are best treated at concentrations outside of this range. Acetaminophen concentrations >150 ug/mL at 4 hours after ingestion  and >50 ug/mL at 12 hours after ingestion are often associated with  toxic reactions.  Performed at [facility], [place] [address], [place], [place] [address]   HIV Antibody (routine testing w rflx)     Status: None   Collection Time: 05/10/21  1:45 PM  Result Value Ref Range   HIV Screen 4th Generation wRfx Non Reactive Non Reactive    Comment: Performed at [facility], [place] [address] [place], [place] [address]  Glucose, capillary     Status: None   Collection Time: 05/10/21  8:39 PM  Result Value Ref Range   Glucose-Capillary 86 70 - 99 mg/dL    Comment: Glucose reference range applies only to samples taken after fasting for at least 8 hours.  Glucose, capillary     Status: None   Collection Time: 05/10/21 11:37 PM  Result Value Ref Range   Glucose-Capillary 91 70 - 99 mg/dL    Comment: Glucose reference range applies only to samples taken after fasting for at least 8 hours.  Glucose, capillary     Status: None   Collection Time: 05/11/21  4:22 AM  Result Value Ref Range   Glucose-Capillary 86 70 - 99 mg/dL    Comment: Glucose reference range applies only to samples taken after fasting for at least 8 hours.  CBC     Status: Abnormal   Collection Time:  05/11/21  5:45 AM  Result Value Ref Range   WBC 2.3 (L) 4.0 - 10.5 K/uL   RBC 3.57 (L) 3.87 - 5.11 MIL/uL   Hemoglobin 11.4 (L) 12.0 - 15.0 g/dL   HCT 36.1 36.0 - 46.0 %   MCV 101.1 (H) 80.0 - 100.0 fL   MCH 31.9 26.0 - 34.0 pg   MCHC 31.6 30.0 - 36.0 g/dL   RDW 13.5 11.5 - 15.5 %   Platelets 112 (L) 150 - 400 K/uL    Comment: SPECIMEN CHECKED FOR CLOTS Immature Platelet Fraction may be clinically indicated, consider ordering this additional test [id number] PLATELET COUNT CONFIRMED BY SMEAR    nRBC 0.0 0.0 - 0.2 %    Comment: Performed at [facility], [place] [address] [place], [place] [address]  Comprehensive metabolic panel Once     Status: Abnormal   Collection Time: 05/11/21  5:45 AM  Result Value Ref Range   Sodium 140 135 - 145 mmol/L   Potassium 4.0 3.5 - 5.1 mmol/L   Chloride 108 98 - 111 mmol/L   CO2 23 22 - 32 mmol/L   Glucose, Bld 90 70 - 99 mg/dL    Comment: Glucose reference range applies only to samples taken after fasting for at least 8 hours.   BUN 12 8 - 23 mg/dL   Creatinine, Ser 1.00 0.44 - 1.00 mg/dL   Calcium 8.5 (L) 8.9 - 10.3 mg/dL  Total Protein 5.3 (L) 6.5 - 8.1 g/dL   Albumin 3.0 (L) 3.5 - 5.0 g/dL   AST 162 (H) 15 - 41 U/L   ALT 673 (H) 0 - 44 U/L   Alkaline Phosphatase 185 (H) 38 - 126 U/L   Total Bilirubin 1.2 0.3 - 1.2 mg/dL   GFR, Estimated >60 >60 mL/min    Comment: (NOTE) Calculated using the CKD-EPI Creatinine Equation (2021)    Anion gap 9 5 - 15    Comment: Performed at [facility], [place] [address], [place], [place] [address]  Glucose, capillary     Status: None   Collection Time: 05/11/21  7:33 AM  Result Value Ref Range   Glucose-Capillary 75 70 - 99 mg/dL    Comment: Glucose reference range applies only to samples taken after fasting for at least 8 hours.     RADIOLOGY RESULTS: CT Abdomen Pelvis W Contrast  Result Date: 05/09/2021 CLINICAL DATA:  65 year old female with abdominal pain and  fever. EXAM: CT ABDOMEN AND PELVIS WITH CONTRAST TECHNIQUE: Multidetector CT imaging of the abdomen and pelvis was performed using the standard protocol following bolus administration of intravenous contrast. CONTRAST:  164m OMNIPAQUE IOHEXOL 300 MG/ML  SOLN COMPARISON:  CT abdomen pelvis dated 12/16/2020. FINDINGS: Lower chest: The visualized lung bases are clear. There is coronary vascular calcification. Small calcified granuloma in the lingula. No intra-abdominal free air or free fluid. Hepatobiliary: Probable fatty liver. There is mild intrahepatic biliary dilatation. There is a cluster of sludge or small stones in the gallbladder. The gallbladder is distended. Probable small pericholecystic fluid. Ultrasound may provide better evaluation if there is high clinical concern for acute cholecystitis. Pancreas: Unremarkable. No pancreatic ductal dilatation or surrounding inflammatory changes. Spleen: Small scattered calcified splenic granuloma. Adrenals/Urinary Tract: The adrenal glands are unremarkable. Mild left renal parenchyma atrophy. There is a 2 cm cyst from the posterior interpolar right kidney. There is no hydronephrosis on either side. There is symmetric enhancement and excretion of contrast by both kidneys. The visualized ureters and urinary bladder appear unremarkable. Stomach/Bowel: Postsurgical changes of gastric bypass. There is no bowel obstruction or active inflammation. The appendix is not visualized with certainty. No inflammatory changes identified in the right lower quadrant. Vascular/Lymphatic: Mild aortoiliac atherosclerotic disease. The IVC is unremarkable. No portal venous gas. There is no adenopathy. Reproductive: Hysterectomy. No adnexal masses. Other: Anterior pelvic wall C-section scar. Musculoskeletal: Osteopenia with degenerative changes of the spine. There is acute appearance compression fracture of superior endplate of L4 with approximately 20% loss of vertebral body height.  Correlation with clinical exam and point tenderness recommended. No retropulsion. IMPRESSION: 1. Acute compression fracture of superior endplate of L4. No retropulsion. 2. Gallstone/sludge with probable small pericholecystic fluid. Ultrasound may provide better evaluation if there is high clinical concern for acute cholecystitis. 3. Aortic Atherosclerosis (ICD10-I70.0). Electronically Signed   By: AAnner CreteM.D.   On: 05/09/2021 22:53   [place]domen Limited  Result Date: 05/10/2021 CLINICAL DATA:  Fever.  Elevated LFTs, abnormal CT EXAM: ULTRASOUND ABDOMEN LIMITED RIGHT UPPER QUADRANT COMPARISON:  CT 05/09/2021 FINDINGS: Gallbladder: Gallbladder is distended. Multiple mobile stones and sludge within the gallbladder. Gallbladder wall is mildly thickened at 4 mm. Trace pericholecystic fluid. Negative sonographic Murphy's. Common bile duct: Diameter: Mildly dilated, 10 mm. The distal duct is poorly visualized due to overlying bowel gas. Liver: No focal lesion identified. Within normal limits in parenchymal echogenicity. Portal vein is patent on color Doppler imaging with normal direction of  blood flow towards the liver. Other: None. IMPRESSION: Cholelithiasis. Gallbladder distension. Sludge present as well as mild wall thickening and trace pericholecystic fluid. Recommend clinical correlation to exclude acute cholecystitis. Mildly dilated common bile duct, 10 mm. Distal duct cannot be visualized due to overlying bowel gas. Electronically Signed   By: Rolm Baptise M.D.   On: 05/10/2021 01:16   DG Chest Port 1 View  Result Date: 05/09/2021 CLINICAL DATA:  Chest pain, dyspnea EXAM: PORTABLE CHEST 1 VIEW COMPARISON:  12/16/2020 FINDINGS: Lungs are clear. No pneumothorax or pleural effusion. Cardiac size within normal limits. Pulmonary vascularity is normal. Numerous surgical clips are again seen at the thoracic inlet likely related to prior bright red ectomy. Remote right seventh rib healed fracture noted. No  acute bone abnormality. IMPRESSION: No active disease. Electronically Signed   By: Fidela Salisbury MD   On: 05/09/2021 23:06    Problem List: Patient Active Problem List   Diagnosis Date Noted   Hepatitis 05/10/2021   Type 1 diabetes mellitus without complication ([place]) 26/33/3545   Osteopenia 01/14/2021   Cholelithiasis 01/02/2021   Sesamoiditis of left foot 11/09/2020   Posterior tibial tendinitis of left lower extremity 11/09/2020   Type 2 diabetes mellitus with diabetic peripheral angiopathy without gangrene, with long-term current use of insulin ([place]) 10/21/2020   Iron deficiency anemia 03/14/2020   Deficiency anemia 02/29/2020   Lesion of vulva 09/02/2019   Menorrhagia 09/02/2019   Neuropathy 09/02/2019   Abdominal pain, epigastric 06/25/2019   Irregular bowel habits 06/25/2019   Coronary artery disease involving native coronary artery of native heart without angina pectoris    Temporomandibular joint disorder 12/05/2018   Fatigue 12/05/2018   Inverted nipple 12/05/2018   Senile purpura ([place]) 11/21/2018   GERD with esophagitis 11/21/2018   Pseudogout involving multiple joints 11/21/2018   Lipoma 62/56/3893   Lichen sclerosus 73/42/8768   H/O gastric bypass 11/16/2016   GERD (gastroesophageal reflux disease) 11/16/2016   Depression 11/16/2016   Diabetic polyneuropathy associated with type 2 diabetes mellitus ([place]) 11/16/2016   Hyperlipidemia 11/16/2016   Left lower quadrant pain 11/16/2016   Uterine cancer [facility]) s/p hysterectomy 1997 01/04/2012   Postsurgical hypothyroidism 09/19/2010   Generalized abdominal pain 10/20/2009   Diabetic neuropathy ([place]) 11/21/2007   Allergic rhinitis 11/21/2007   Type 2 diabetes mellitus with diabetic neuropathy, unspecified ([place]) 07/08/2007   Dyslipidemia associated with type 2 diabetes mellitus ([place]) 07/08/2007    Assessment & Plan: Await MRCP and plan intervention as above.      Matt B. Hassell Done, MD, [facility] [phone] beeper [phone]  05/11/2021 9:02 AM

## 2021-05-11 NOTE — Progress Notes (Signed)
[facility]                                                                              Patient Demographics  Savannah [name], is a 65 y.o. female, DOB - 10/14/1956, UUE:[id number]  Admit date - 05/09/2021   Admitting Physician Vernelle Emerald, MD  Outpatient Primary MD for the patient is Vivi Barrack, MD  Outpatient specialists:   LOS - 1  days   Medical records reviewed and are as summarized below:    Chief Complaint  Patient presents with   Abdominal Pain       Brief summary   Patient is a 65 year old female with history of diabetes mellitus, neuropathy, esophagitis, GERD presented with abdominal pain.  Patient reported pain mostly in the epigastric region for last 3 days, sudden onset, severe, intermittent and associated with poor oral intake and changes in the bowel habits.  She had a temp of 101.2 F at home.  Patient reported intermittent headache, back pain and had a fall about 3 weeks ago that resulted in lumbar spine compression fracture. In ED, patient was noted to have elevated LFTs with alk phos 263, AST 78, ALT 1506 CT imaging, ultrasound concerning for cholelithiasis, choledocholithiasis with mild CBD dilatation.  GI was consulted and patient was admitted for further work-up.  Assessment & Plan    Principal Problem:   Abdominal pain, epigastric -Concerning for possible gallbladder issue.  CT and ultrasound concerning for cholelithiasis, choledocholithiasis with mild CBD dilatation -MRCP showed gallbladder sludge without evidence of acute cholecystitis, no biliary obstruction or choledocholithiasis -Given prior history of gastric bypass General surgery consulted.   -Continue n.p.o., IV fluids, pain control  Active Problems:   Type 2 diabetes mellitus with diabetic neuropathy, unspecified (HCC) uncontrolled with hypoglycemia and diabetic neuropathy -Placed on D5 half-normal saline IV fluids due to hypoglycemia    GERD (gastroesophageal  reflux disease) -Continue IV Protonix    Coronary artery disease involving native coronary artery of native heart without angina pectoris -Currently no acute cardiac symptoms, no chest pain or shortness of breath  Acute compression fracture of L4 from recent mechanical fall -Continue pain control, fall precautions. -PT evaluation   Code Status: Full CODE STATUS DVT Prophylaxis:  SCDs Start: 05/10/21 1329   Level of Care: Level of care: Telemetry Family Communication: Discussed all imaging results, lab results, explained to the patient    Disposition Plan:     Status is: Inpatient  Remains inpatient appropriate because:Inpatient level of care appropriate due to severity of illness  Dispo: The patient is from: Home              Anticipated d/c is to: Home              Patient currently is not medically stable to d/c.   Difficult to place patient No      Time Spent in minutes   2mns    Procedures:  MRCP  Consultants:   Gastroenterology General surgery  Antimicrobials:   Anti-infectives (From admission, onward)    Start     Dose/Rate Route Frequency Ordered Stop   05/10/21 1400  piperacillin-tazobactam (ZOSYN) IVPB 3.375 g        3.375 g 12.5 mL/hr over 240 Minutes Intravenous Every 8 hours 05/10/21 1232     05/09/21 2215  piperacillin-tazobactam (ZOSYN) IVPB 3.375 g        3.375 g 100 mL/hr over 30 Minutes Intravenous  Once 05/09/21 2213 05/10/21 0244          Medications  Scheduled Meds:  gabapentin  1,200 mg Oral TID   levothyroxine  112 mcg Oral QAC breakfast   loratadine  10 mg Oral Daily   mupirocin ointment  1 application Nasal BID   pantoprazole (PROTONIX) IV  40 mg Intravenous Daily   Continuous Infusions:  dextrose 5 % and 0.45% NaCl 100 mL/hr at 05/11/21 0814   piperacillin-tazobactam (ZOSYN)  IV 3.375 g (05/11/21 0544)   PRN Meds:.cyclobenzaprine, morphine injection, nitroGLYCERIN, ondansetron **OR** ondansetron (ZOFRAN)  IV      Subjective:   Savannah [name] was seen and examined today.  Complaining of epigastric, right upper quadrant pain, nausea.  No fevers.  Patient denies dizziness, chest pain, shortness of breath, new weakness, numbess, tingling. No acute events overnight.    Objective:   Vitals:   05/10/21 1944 05/10/21 2000 05/10/21 2307 05/11/21 0419  BP: (!) 157/88  140/84 (!) 145/84  Pulse: 64  64 65  Resp: 16  14 14   Temp: 97.7 F (36.5 C)  98.1 F (36.7 C) 97.7 F (36.5 C)  TempSrc: Oral  Oral Oral  SpO2: 99%  95% 95%  Weight:  75 kg    Height:  5' 11"  (1.803 m)      Intake/Output Summary (Last 24 hours) at 05/11/2021 1255 Last data filed at 05/11/2021 1100 Gross per 24 hour  Intake 1506.27 ml  Output 2300 ml  Net -793.73 ml     Wt Readings from Last 3 Encounters:  05/10/21 75 kg  04/12/21 76.3 kg  02/13/21 76.1 kg     Exam General: Alert and oriented x 3, uncomfortable Cardiovascular: S1 S2 auscultated, no murmurs, RRR Respiratory: Clear to auscultation bilaterally, no wheezing, rales or rhonchi Gastrointestinal: Soft, epigastric TTP, nondistended, + bowel sounds Ext: no pedal edema bilaterally Neuro: no new deficits Psych: Normal affect and demeanor, alert and oriented x3    Data Reviewed:  I have personally reviewed following labs and imaging studies  Micro Results Recent Results (from the past 240 hour(s))  Novel Coronavirus, NAA ([facility])     Status: None   Collection Time: 05/09/21  2:58 PM   Specimen: Nasopharyngeal(NP) swabs in vial transport medium   Nasopharynge  Testing  Result Value Ref Range Status   SARS-CoV-2, NAA Not Detected Not Detected Final    Comment: This nucleic acid amplification test was developed and its performance characteristics determined by [employer]. Nucleic acid amplification tests include RT-PCR and TMA. This test has not been FDA cleared or approved. This test has been authorized by FDA under an Emergency Use  Authorization (EUA). This test is only authorized for the duration of time the declaration that circumstances exist justifying the authorization of the emergency use of in vitro diagnostic tests for detection of SARS-CoV-2 virus and/or diagnosis of COVID-19 infection under section 564(b)(1) of the Act, 21 U.S.C. 993ZJI-9(C) (1), unless the authorization is terminated or revoked sooner. When diagnostic testing is negative, the possibility of a false negative result should be considered in the context of a patient's recent exposures and the presence of clinical signs and symptoms consistent with COVID-19. An  individual without symptoms of COVID-19 and who is not shedding SARS-CoV-2 virus wo uld expect to have a negative (not detected) result in this assay.   SARS-COV-2, NAA 2 DAY TAT     Status: None   Collection Time: 05/09/21  2:58 PM   Nasopharynge  Testing  Result Value Ref Range Status   SARS-CoV-2, NAA 2 DAY TAT Performed  Final  Urine culture     Status: None (Preliminary result)   Collection Time: 05/09/21  7:33 PM   Specimen: In/Out Cath Urine  Result Value Ref Range Status   Specimen Description   Final    IN/OUT CATH URINE Performed at [facility], [address] [place], [place] [address]    Special Requests   Final    NONE Performed at [facility], [address] [place], [place] [address]    Culture   Final    CULTURE REINCUBATED FOR BETTER GROWTH Performed at [facility], [place] [address], [place], [place] [address]    Report Status PENDING  Incomplete  Culture, blood (Routine X 2) w Reflex to ID Panel     Status: None (Preliminary result)   Collection Time: 05/09/21 10:20 PM   Specimen: BLOOD  Result Value Ref Range Status   Specimen Description   Final    BLOOD RIGHT ANTECUBITAL Performed at [facility], [address], [place], [place] [address]    Special Requests   Final    BOTTLES  DRAWN AEROBIC AND ANAEROBIC Blood Culture adequate volume Performed at [facility], [address] [place], [place] [address]    Culture   Final    NO GROWTH < 24 HOURS Performed at [facility], [place] [address], [place], [place] [address]    Report Status PENDING  Incomplete  Culture, blood (Routine X 2) w Reflex to ID Panel     Status: None (Preliminary result)   Collection Time: 05/09/21 11:00 PM   Specimen: BLOOD  Result Value Ref Range Status   Specimen Description   Final    BLOOD BOTTLES DRAWN AEROBIC AND ANAEROBIC Performed at [facility], [address], [place], [place] [address]    Special Requests   Final    BLOOD LEFT FOREARM Blood Culture adequate volume Performed at [facility], [address], [place],  [address]    Culture   Final    NO GROWTH < 24 HOURS Performed at [facility], [place] [address], [place],  [address]    Report Status PENDING  Incomplete  Resp Panel by RT-PCR (Flu A&B, Covid) Nasopharyngeal Swab     Status: None   Collection Time: 05/10/21  1:46 AM   Specimen: Nasopharyngeal Swab; Nasopharyngeal(NP) swabs in vial transport medium  Result Value Ref Range Status   SARS Coronavirus 2 by RT PCR NEGATIVE NEGATIVE Final    Comment: (NOTE) SARS-CoV-2 target nucleic acids are NOT DETECTED.  The SARS-CoV-2 RNA is generally detectable in upper respiratory specimens during the acute phase of infection. The lowest concentration of SARS-CoV-2 viral copies this assay can detect is 138 copies/mL. A negative result does not preclude SARS-Cov-2 infection and should not be used as the sole basis for treatment or other patient management decisions. A negative result may occur with  improper specimen collection/handling, submission of specimen other than nasopharyngeal swab, presence of viral mutation(s) within the areas targeted by this assay, and inadequate number of  viral copies(<138 copies/mL). A negative  result must be combined with clinical observations, patient history, and epidemiological information. The expected result is Negative.  Fact Sheet for Patients:  EntrepreneurPulse.com.au  Fact Sheet for Healthcare Providers:  IncredibleEmployment.be  This test is no t yet approved or cleared by the [place] FDA and  has been authorized for detection and/or diagnosis of SARS-CoV-2 by FDA under an Emergency Use Authorization (EUA). This EUA will remain  in effect (meaning this test can be used) for the duration of the COVID-19 declaration under Section 564(b)(1) of the Act, 21 U.S.C.section 360bbb-3(b)(1), unless the authorization is terminated  or revoked sooner.       Influenza A by PCR NEGATIVE NEGATIVE Final   Influenza B by PCR NEGATIVE NEGATIVE Final    Comment: (NOTE) The Xpert Xpress SARS-CoV-2/FLU/RSV plus assay is intended as an aid in the diagnosis of influenza from Nasopharyngeal swab specimens and should not be used as a sole basis for treatment. Nasal washings and aspirates are unacceptable for Xpert Xpress SARS-CoV-2/FLU/RSV testing.  Fact Sheet for Patients: EntrepreneurPulse.com.au  Fact Sheet for Healthcare Providers: IncredibleEmployment.be  This test is not yet approved or cleared by the [place] FDA and has been authorized for detection and/or diagnosis of SARS-CoV-2 by FDA under an Emergency Use Authorization (EUA). This EUA will remain in effect (meaning this test can be used) for the duration of the COVID-19 declaration under Section 564(b)(1) of the Act, 21 U.S.C. section 360bbb-3(b)(1), unless the authorization is terminated or revoked.  Performed at [facility], [address] [place], [place] [address]     Radiology Reports CT Abdomen Pelvis W Contrast  Result Date: 05/09/2021 CLINICAL DATA:   65 year old female with abdominal pain and fever. EXAM: CT ABDOMEN AND PELVIS WITH CONTRAST TECHNIQUE: Multidetector CT imaging of the abdomen and pelvis was performed using the standard protocol following bolus administration of intravenous contrast. CONTRAST:  169m OMNIPAQUE IOHEXOL 300 MG/ML  SOLN COMPARISON:  CT abdomen pelvis dated 12/16/2020. FINDINGS: Lower chest: The visualized lung bases are clear. There is coronary vascular calcification. Small calcified granuloma in the lingula. No intra-abdominal free air or free fluid. Hepatobiliary: Probable fatty liver. There is mild intrahepatic biliary dilatation. There is a cluster of sludge or small stones in the gallbladder. The gallbladder is distended. Probable small pericholecystic fluid. Ultrasound may provide better evaluation if there is high clinical concern for acute cholecystitis. Pancreas: Unremarkable. No pancreatic ductal dilatation or surrounding inflammatory changes. Spleen: Small scattered calcified splenic granuloma. Adrenals/Urinary Tract: The adrenal glands are unremarkable. Mild left renal parenchyma atrophy. There is a 2 cm cyst from the posterior interpolar right kidney. There is no hydronephrosis on either side. There is symmetric enhancement and excretion of contrast by both kidneys. The visualized ureters and urinary bladder appear unremarkable. Stomach/Bowel: Postsurgical changes of gastric bypass. There is no bowel obstruction or active inflammation. The appendix is not visualized with certainty. No inflammatory changes identified in the right lower quadrant. Vascular/Lymphatic: Mild aortoiliac atherosclerotic disease. The IVC is unremarkable. No portal venous gas. There is no adenopathy. Reproductive: Hysterectomy. No adnexal masses. Other: Anterior pelvic wall C-section scar. Musculoskeletal: Osteopenia with degenerative changes of the spine. There is acute appearance compression fracture of superior endplate of L4 with approximately  20% loss of vertebral body height. Correlation with clinical exam and point tenderness recommended. No retropulsion. IMPRESSION: 1. Acute compression fracture of superior endplate of L4. No retropulsion. 2. Gallstone/sludge with probable small pericholecystic fluid. Ultrasound may provide better evaluation if there is high clinical concern  for acute cholecystitis. 3. Aortic Atherosclerosis (ICD10-I70.0). Electronically Signed   By: Anner Crete M.D.   On: 05/09/2021 22:53   US Abdomen Limited  Result Date: 05/10/2021 CLINICAL DATA:  Fever.  Elevated LFTs, abnormal CT EXAM: ULTRASOUND ABDOMEN LIMITED RIGHT UPPER QUADRANT COMPARISON:  CT 05/09/2021 FINDINGS: Gallbladder: Gallbladder is distended. Multiple mobile stones and sludge within the gallbladder. Gallbladder wall is mildly thickened at 4 mm. Trace pericholecystic fluid. Negative sonographic Murphy's. Common bile duct: Diameter: Mildly dilated, 10 mm. The distal duct is poorly visualized due to overlying bowel gas. Liver: No focal lesion identified. Within normal limits in parenchymal echogenicity. Portal vein is patent on color Doppler imaging with normal direction of blood flow towards the liver. Other: None. IMPRESSION: Cholelithiasis. Gallbladder distension. Sludge present as well as mild wall thickening and trace pericholecystic fluid. Recommend clinical correlation to exclude acute cholecystitis. Mildly dilated common bile duct, 10 mm. Distal duct cannot be visualized due to overlying bowel gas. Electronically Signed   By: Rolm Baptise M.D.   On: 05/10/2021 01:16   DG Chest Port 1 View  Result Date: 05/09/2021 CLINICAL DATA:  Chest pain, dyspnea EXAM: PORTABLE CHEST 1 VIEW COMPARISON:  12/16/2020 FINDINGS: Lungs are clear. No pneumothorax or pleural effusion. Cardiac size within normal limits. Pulmonary vascularity is normal. Numerous surgical clips are again seen at the thoracic inlet likely related to prior bright red ectomy. Remote right  seventh rib healed fracture noted. No acute bone abnormality. IMPRESSION: No active disease. 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  XR Lumbar Spine 2-3 Views  Result Date: 04/18/2021 L4 superior endplate changes questionable compression fracture versus Schmorl's nodes.   Lab Data:  CBC: Recent Labs  Lab 05/09/21 1933 05/11/21 0545  WBC 2.6*  2.7* 2.3*  NEUTROABS 1.9  --   HGB 12.9  12.9 11.4*  HCT 40.7  40.4 36.1  MCV 100.0  99.3 101.1*  PLT 140*  134* 824*   Basic Metabolic Panel: Recent Labs  Lab 05/09/21 1933 05/10/21 1340 05/11/21 0545  NA 140 139 140  K 4.3 3.9 4.0  CL 104 106 108  CO2 27 23 23   GLUCOSE 120* 175* 90  BUN 22 16 12   CREATININE 1.11* 0.95 1.00  CALCIUM 9.6 8.7* 8.5*   GFR: Estimated Creatinine Clearance: 61 mL/min (by C-G formula based on SCr of 1 mg/dL). Liver Function Tests: Recent Labs  Lab 05/09/21 1933 05/10/21 1340 05/11/21 0545  AST 788* 328* 162*  ALT 1,506* 950* 673*  ALKPHOS 263* 207* 185*  BILITOT 1.1 0.8 1.2  PROT 6.0* 5.6* 5.3*  ALBUMIN 3.8 3.1* 3.0*   Recent Labs  Lab 05/09/21 1933  LIPASE 31   No results for input(s): AMMONIA in the last 168 hours. Coagulation Profile: No results for input(s): INR, PROTIME in the last 168 hours. Cardiac Enzymes: Recent Labs  Lab 05/10/21 1345  CKTOTAL 21*   BNP (last 3 results) No results for input(s): PROBNP in the last 8760 hours. HbA1C: No results for input(s): HGBA1C in the last 72 hours. CBG: Recent Labs  Lab 05/10/21 2039 05/10/21 2337 05/11/21 0422 05/11/21 0733 05/11/21 1152  GLUCAP 86 91 86 75 108*   Lipid Profile: No results for input(s): CHOL, HDL, LDLCALC, TRIG, CHOLHDL, LDLDIRECT in the last 72 hours. Thyroid Function Tests: No results for input(s): TSH, T4TOTAL, FREET4, T3FREE, THYROIDAB in  the last 72  hours. Anemia Panel: No results for input(s): VITAMINB12, FOLATE, FERRITIN, TIBC, IRON, RETICCTPCT in the last 72 hours. Urine analysis:    Component Value Date/Time   COLORURINE YELLOW 05/09/2021 1933   APPEARANCEUR CLEAR 05/09/2021 1933   LABSPEC 1.030 05/09/2021 1933   PHURINE 5.5 05/09/2021 1933   GLUCOSEU >1,000 (A) 05/09/2021 1933   GLUCOSEU >=1000 (A) 01/11/2017 0933   HGBUR NEGATIVE 05/09/2021 1933   HGBUR negative 12/02/2009 0841   BILIRUBINUR NEGATIVE 05/09/2021 1933   KETONESUR TRACE (A) 05/09/2021 1933   PROTEINUR 30 (A) 05/09/2021 1933   UROBILINOGEN 0.2 01/11/2017 0933   NITRITE NEGATIVE 05/09/2021 1933   LEUKOCYTESUR NEGATIVE 05/09/2021 1933     Savannah [name] M.D. [facility] 05/11/2021, 12:55 PM  Available via Epic secure chat 7am-7pm After 7 pm, please refer to night coverage provider listed on amion.

## 2021-05-11 NOTE — Progress Notes (Signed)
Seen by my attending this am. Please see his progress note for more details. MRCP negative for Choledocholithiasis. Dr. Hassell Done recommends Laparoscopic Cholecystectomy.  I have explained the procedure, risks, and aftercare of Laparoscopic cholecystectomy.  Risks include but are not limited to anesthesia (MI, CVA, death), bleeding, infection, wound problems, hernia, bile leak, injury to common bile duct/liver/intestine and diarrhea post op.  She seems to understand and agrees to proceed.   Alferd Apa, [facility] 2:20 PM, 05/11/2021

## 2021-05-11 NOTE — Progress Notes (Addendum)
Progress Note   Subjective  Chief Complaint: Abdominal Pain, Elevtaed LFT's  MRCP this morning with NO dilation in the bile duct/ no choledocholithiasis.  This morning, patient tells me she feels well other than some back pain and a headache. She is also hungry.  Does ask some questions in regards to surgical plans and results from MRCP.   Objective   Vital signs in last 24 hours: Temp:  [97.7 F (36.5 C)-98.2 F (36.8 C)] 97.7 F (36.5 C) (06/30 0419) Pulse Rate:  [64-73] 65 (06/30 0419) Resp:  [14-18] 14 (06/30 0419) BP: (127-157)/(75-88) 145/84 (06/30 0419) SpO2:  [95 %-100 %] 95 % (06/30 0419) Weight:  [75 kg] 75 kg (06/29 2000)   General:    white female in NAD Heart:  Regular rate and rhythm; no murmurs Lungs: Respirations even and unlabored, lungs CTA bilaterally Abdomen:  Soft, nontender and nondistended. Normal bowel sounds. Psych:  Cooperative. Normal mood and affect.  Lab Results: Recent Labs    05/09/21 1933 05/11/21 0545  WBC 2.6*  2.7* 2.3*  HGB 12.9  12.9 11.4*  HCT 40.7  40.4 36.1  PLT 140*  134* 112*   BMET Recent Labs    05/09/21 1933 05/10/21 1340 05/11/21 0545  NA 140 139 140  K 4.3 3.9 4.0  CL 104 106 108  CO2 27 23 23   GLUCOSE 120* 175* 90  BUN 22 16 12   CREATININE 1.11* 0.95 1.00  CALCIUM 9.6 8.7* 8.5*   Hepatic Function Latest Ref Rng & Units 05/11/2021 05/10/2021 05/09/2021  Total Protein 6.5 - 8.1 g/dL 5.3(L) 5.6(L) 6.0(L)  Albumin 3.5 - 5.0 g/dL 3.0(L) 3.1(L) 3.8  AST 15 - 41 U/L 162(H) 328(H) 788(H)  ALT 0 - 44 U/L 673(H) 950(H) 1,506(H)  Alk Phosphatase 38 - 126 U/L 185(H) 207(H) 263(H)  Total Bilirubin 0.3 - 1.2 mg/dL 1.2 0.8 1.1  Bilirubin, Direct 0.0 - 0.3 mg/dL - - -     Studies/Results: CT Abdomen Pelvis W Contrast  Result Date: 05/09/2021 CLINICAL DATA:  65 year old female with abdominal pain and fever. EXAM: CT ABDOMEN AND PELVIS WITH CONTRAST TECHNIQUE: Multidetector CT imaging of the abdomen and pelvis was  performed using the standard protocol following bolus administration of intravenous contrast. CONTRAST:  177m OMNIPAQUE IOHEXOL 300 MG/ML  SOLN COMPARISON:  CT abdomen pelvis dated 12/16/2020. FINDINGS: Lower chest: The visualized lung bases are clear. There is coronary vascular calcification. Small calcified granuloma in the lingula. No intra-abdominal free air or free fluid. Hepatobiliary: Probable fatty liver. There is mild intrahepatic biliary dilatation. There is a cluster of sludge or small stones in the gallbladder. The gallbladder is distended. Probable small pericholecystic fluid. Ultrasound may provide better evaluation if there is high clinical concern for acute cholecystitis. Pancreas: Unremarkable. No pancreatic ductal dilatation or surrounding inflammatory changes. Spleen: Small scattered calcified splenic granuloma. Adrenals/Urinary Tract: The adrenal glands are unremarkable. Mild left renal parenchyma atrophy. There is a 2 cm cyst from the posterior interpolar right kidney. There is no hydronephrosis on either side. There is symmetric enhancement and excretion of contrast by both kidneys. The visualized ureters and urinary bladder appear unremarkable. Stomach/Bowel: Postsurgical changes of gastric bypass. There is no bowel obstruction or active inflammation. The appendix is not visualized with certainty. No inflammatory changes identified in the right lower quadrant. Vascular/Lymphatic: Mild aortoiliac atherosclerotic disease. The IVC is unremarkable. No portal venous gas. There is no adenopathy. Reproductive: Hysterectomy. No adnexal masses. Other: Anterior pelvic wall C-section scar. Musculoskeletal: Osteopenia  with degenerative changes of the spine. There is acute appearance compression fracture of superior endplate of L4 with approximately 20% loss of vertebral body height. Correlation with clinical exam and point tenderness recommended. No retropulsion. IMPRESSION: 1. Acute compression fracture  of superior endplate of L4. No retropulsion. 2. Gallstone/sludge with probable small pericholecystic fluid. Ultrasound may provide better evaluation if there is high clinical concern for acute cholecystitis. 3. Aortic Atherosclerosis (ICD10-I70.0). Electronically Signed   By: Anner Crete M.D.   On: 05/09/2021 22:53   US Abdomen Limited  Result Date: 05/10/2021 CLINICAL DATA:  Fever.  Elevated LFTs, abnormal CT EXAM: ULTRASOUND ABDOMEN LIMITED RIGHT UPPER QUADRANT COMPARISON:  CT 05/09/2021 FINDINGS: Gallbladder: Gallbladder is distended. Multiple mobile stones and sludge within the gallbladder. Gallbladder wall is mildly thickened at 4 mm. Trace pericholecystic fluid. Negative sonographic Murphy's. Common bile duct: Diameter: Mildly dilated, 10 mm. The distal duct is poorly visualized due to overlying bowel gas. Liver: No focal lesion identified. Within normal limits in parenchymal echogenicity. Portal vein is patent on color Doppler imaging with normal direction of blood flow towards the liver. Other: None. IMPRESSION: Cholelithiasis. Gallbladder distension. Sludge present as well as mild wall thickening and trace pericholecystic fluid. Recommend clinical correlation to exclude acute cholecystitis. Mildly dilated common bile duct, 10 mm. Distal duct cannot be visualized due to overlying bowel gas. Electronically Signed   By: Rolm Baptise M.D.   On: 05/10/2021 01:16   DG Chest Port 1 View  Result Date: 05/09/2021 CLINICAL DATA:  Chest pain, dyspnea EXAM: PORTABLE CHEST 1 VIEW COMPARISON:  12/16/2020 FINDINGS: Lungs are clear. No pneumothorax or pleural effusion. Cardiac size within normal limits. Pulmonary vascularity is normal. Numerous surgical clips are again seen at the thoracic inlet likely related to prior bright red ectomy. Remote right seventh rib healed fracture noted. No acute bone abnormality. IMPRESSION: No active disease. Electronically Signed   By: Fidela Salisbury MD   On: 05/09/2021 23:06        Assessment / Plan:   Assessment: Abdominal pain: interestingly mostly epigastric, started 5 days ago, better since hospitalization, initially with fever and chills, LFT's increased but trending downward, MRCP with no choledocholithiasis-most likely patient passed a stone Elevated LFT''s GERD Compression fracture L4  Plan: MRCP this morning has not fully resulted in Epic but when looking in PACS there is report of NO biliary dilation. Discussed this with patient. She will not require ERCP at this time. Appreciate continued care plan as per surgical team Patient asking if she can eat this morning, will communicate with surgical team Continue supportive measures Please await any final recommendations from Dr. Ardis Hughs. We will sign off.   Thank you for your kind consultation.    LOS: 1 day   Levin Erp  05/11/2021, 10:18 AM   ________________________________________________________________________  Velora Heckler GI MD note:  She had already been brought down to OR by the time I rounded this afternoon.  I agree with lap chole today.  Please call with any further questions or concerns. She does not need follow up with GI if all goes well with her surgery.   Owens Loffler, MD [facility] Pager [phone]

## 2021-05-11 NOTE — Transfer of Care (Signed)
Immediate Anesthesia Transfer of Care Note  Patient: Savannah [name]  Procedure(s) Performed: LAPAROSCOPIC CHOLECYSTECTOMY WITH POSSIBLE  INTRAOPERATIVE CHOLANGIOGRAM  Patient Location: PACU  Anesthesia Type:General  Level of Consciousness: awake, alert , oriented and patient cooperative  Airway & Oxygen Therapy: Patient Spontanous Breathing and Patient connected to face mask oxygen  Post-op Assessment: Report given to RN and Post -op Vital signs reviewed and stable  Post vital signs: Reviewed and stable  Last Vitals:  Vitals Value Taken Time  BP 136/69 05/11/21 1735  Temp 36.7 C 05/11/21 1735  Pulse 77 05/11/21 1741  Resp 14 05/11/21 1741  SpO2 100 % 05/11/21 1741  Vitals shown include unvalidated device data.  Last Pain:  Vitals:   05/11/21 1735  TempSrc:   PainSc: 0-No pain         Complications: No notable events documented.

## 2021-05-11 NOTE — Op Note (Signed)
[place]  Primary Care Physician:  Vivi Barrack, MD    05/11/2021  5:14 PM  Procedure: Laparoscopic Cholecystectomy with intraoperative cholangiogram  Surgeon: Catalina Antigua B. Hassell Done, MD, FACS Asst:  Lucas Mallow  Anes:  General  Drains:  None  Findings: Acute cholecystitis with small CBD stones.  IOC showing delayed drainage of the CBD into the duodenum.  Multiple small black stones consistent with small CBD stones--likely will pass   Description of Procedure: The patient was taken to OR 4 and given general anesthesia.  The patient was prepped with chlorohexidine prep and draped sterilely. A time out was performed including identifying the patient and discussing their procedure.  Access to the abdomen was achieved with a 5 mm Optiview through the right upper quadrant.  Port placement included three 5 mm trocars and one 11 mm trocars.    The gallbladder was visualized and appeared acutely inflamed and was stuck to the duodenum.   The fundus of the gallbaldder was grasped and the gallbladder was elevated. Traction on the infundibulum allowed for successful demonstration of the critical view. Inflammatory changes were acute and with chronic adhesions.  The cystic duct was identified and clipped up on the gallbladder and an incision was made in the cystic duct and the Reddick catheter was inserted after milking the cystic duct of any debris. Black anthracotic pigment stones were milked out of the cystic duct.  A dynamic cholangiogram was performed which demonstrated a large CBD and some flow into the duodenum.    The cystic duct was then triple clipped and divided, the cystic artery was double clipped and divided and then the gallbladder was removed from the gallbladder bed. Removal of the gallbladder from the gallbladder bed was performed without spillage.  The gallbladder was then placed in a bag and brought out through one of the trocar sites. The gallbladder bed was inspected and no bleeding  or bile leaks were seen.   Laparoscopic visualization was used when closing fascial defects for trocar sites.   Incisions were injected with Exparel and closed with 4-0 Monocryl and Dermbond on the skin.  Sponge and needle count were correct.    The patient was taken to the recovery room in satisfactory condition.

## 2021-05-11 NOTE — Anesthesia Postprocedure Evaluation (Signed)
Anesthesia Post Note  Patient: Savannah [name]  Procedure(s) Performed: LAPAROSCOPIC CHOLECYSTECTOMY WITH POSSIBLE  INTRAOPERATIVE CHOLANGIOGRAM     Patient location during evaluation: PACU Anesthesia Type: General Level of consciousness: awake and alert Pain management: pain level controlled Vital Signs Assessment: post-procedure vital signs reviewed and stable Respiratory status: spontaneous breathing, nonlabored ventilation, respiratory function stable and patient connected to nasal cannula oxygen Cardiovascular status: blood pressure returned to baseline and stable Postop Assessment: no apparent nausea or vomiting Anesthetic complications: no   No notable events documented.  Last Vitals:  Vitals:   05/11/21 1815 05/11/21 1830  BP: 128/68 122/68  Pulse: 76 74  Resp: 11 12  Temp:    SpO2: 100% 100%    Last Pain:  Vitals:   05/11/21 1840  TempSrc:   PainSc: Asleep                 Nozomi Mettler,W. EDMOND

## 2021-05-11 NOTE — Anesthesia Preprocedure Evaluation (Signed)
Anesthesia Evaluation  Patient identified by MRN, date of birth, ID band Patient awake    Reviewed: Allergy & Precautions, NPO status , Patient's Chart, lab work & pertinent test results  Airway Mallampati: II  TM Distance: >3 FB Neck ROM: Full    Dental no notable dental hx.    Pulmonary neg pulmonary ROS, former smoker,    Pulmonary exam normal breath sounds clear to auscultation       Cardiovascular hypertension, Normal cardiovascular exam Rhythm:Regular Rate:Normal     Neuro/Psych negative neurological ROS  negative psych ROS   GI/Hepatic Neg liver ROS, GERD  ,  Endo/Other  diabetesHypothyroidism   Renal/GU negative Renal ROS  negative genitourinary   Musculoskeletal negative musculoskeletal ROS (+)   Abdominal   Peds negative pediatric ROS (+)  Hematology negative hematology ROS (+)   Anesthesia Other Findings   Reproductive/Obstetrics negative OB ROS                             Anesthesia Physical Anesthesia Plan  ASA: 2  Anesthesia Plan: General   Post-op Pain Management:    Induction: Intravenous  PONV Risk Score and Plan: 3 and Ondansetron, Dexamethasone and Treatment may vary due to age or medical condition  Airway Management Planned: Oral ETT  Additional Equipment:   Intra-op Plan:   Post-operative Plan: Extubation in OR  Informed Consent: I have reviewed the patients History and Physical, chart, labs and discussed the procedure including the risks, benefits and alternatives for the proposed anesthesia with the patient or authorized representative who has indicated his/her understanding and acceptance.     Dental advisory given  Plan Discussed with: CRNA and Surgeon  Anesthesia Plan Comments:         Anesthesia Quick Evaluation

## 2021-05-12 ENCOUNTER — Encounter (HOSPITAL_COMMUNITY): Payer: Self-pay | Admitting: Surgery

## 2021-05-12 DIAGNOSIS — F320 Major depressive disorder, single episode, mild: Secondary | ICD-10-CM

## 2021-05-12 LAB — COMPREHENSIVE METABOLIC PANEL
ALT: 501 U/L — ABNORMAL HIGH (ref 0–44)
AST: 111 U/L — ABNORMAL HIGH (ref 15–41)
Albumin: 3.1 g/dL — ABNORMAL LOW (ref 3.5–5.0)
Alkaline Phosphatase: 179 U/L — ABNORMAL HIGH (ref 38–126)
Anion gap: 13 (ref 5–15)
BUN: 14 mg/dL (ref 8–23)
CO2: 19 mmol/L — ABNORMAL LOW (ref 22–32)
Calcium: 8.4 mg/dL — ABNORMAL LOW (ref 8.9–10.3)
Chloride: 106 mmol/L (ref 98–111)
Creatinine, Ser: 1.02 mg/dL — ABNORMAL HIGH (ref 0.44–1.00)
GFR, Estimated: >60 mL/min (ref >60)
Glucose, Bld: 142 mg/dL — ABNORMAL HIGH (ref 70–99)
Potassium: 4.5 mmol/L (ref 3.5–5.1)
Sodium: 138 mmol/L (ref 135–145)
Total Bilirubin: 1.3 mg/dL — ABNORMAL HIGH (ref 0.3–1.2)
Total Protein: 5.5 g/dL — ABNORMAL LOW (ref 6.5–8.1)

## 2021-05-12 LAB — GLUCOSE, CAPILLARY
Glucose-Capillary: 138 mg/dL — ABNORMAL HIGH (ref 70–99)
Glucose-Capillary: 114 mg/dL — ABNORMAL HIGH (ref 70–99)
Glucose-Capillary: 154 mg/dL — ABNORMAL HIGH (ref 70–99)
Glucose-Capillary: 126 mg/dL — ABNORMAL HIGH (ref 70–99)
Glucose-Capillary: 232 mg/dL — ABNORMAL HIGH (ref 70–99)

## 2021-05-12 LAB — URINE CULTURE: Culture: 80000 — AB

## 2021-05-12 LAB — CBC
HCT: 35.9 % — ABNORMAL LOW (ref 36.0–46.0)
Hemoglobin: 11.5 g/dL — ABNORMAL LOW (ref 12.0–15.0)
MCH: 32 pg (ref 26.0–34.0)
MCHC: 32 g/dL (ref 30.0–36.0)
MCV: 100 fL (ref 80.0–100.0)
Platelets: 136 10*3/uL — ABNORMAL LOW (ref 150–400)
RBC: 3.59 MIL/uL — ABNORMAL LOW (ref 3.87–5.11)
RDW: 13.3 % (ref 11.5–15.5)
WBC: 5.8 10*3/uL (ref 4.0–10.5)
nRBC: 0 % (ref 0.0–0.2)

## 2021-05-12 MED ORDER — INSULIN ASPART 100 UNIT/ML IJ SOLN: 0.0000 [IU] | Freq: Three times a day (TID) | INTRAMUSCULAR | Status: DC

## 2021-05-12 MED ORDER — OXYCODONE HCL 5 MG PO TABS
5.0000 mg | ORAL_TABLET | ORAL | Status: DC | PRN
  Administered 2021-05-12 (×3): 5 mg via ORAL
  Administered 2021-05-13: 10 mg via ORAL
  Filled 2021-05-12: qty 2
  Filled 2021-05-12 (×3): qty 1

## 2021-05-12 MED ORDER — LACTATED RINGERS IV SOLN: INTRAVENOUS | Status: DC

## 2021-05-12 MED ORDER — ACETAMINOPHEN 500 MG PO TABS: 1000.0000 mg | ORAL_TABLET | Freq: Four times a day (QID) | ORAL | Status: DC | PRN

## 2021-05-12 MED ORDER — INSULIN ASPART 100 UNIT/ML IJ SOLN
0.0000 [IU] | Freq: Every day | INTRAMUSCULAR | Status: DC
  Administered 2021-05-12: 2 [IU] via SUBCUTANEOUS

## 2021-05-12 NOTE — Addendum Note (Signed)
Addendum  created 05/12/21 1557 by Cleda Daub, CRNA   Intraprocedure Event edited

## 2021-05-12 NOTE — Progress Notes (Signed)
[facility]                                                                              Patient Demographics  Savannah [name], is a 65 y.o. female, DOB - 12-07-1955, KLK:[id number]  Admit date - 05/09/2021   Admitting Physician Vernelle Emerald, MD  Outpatient Primary MD for the patient is Vivi Barrack, MD  Outpatient specialists:   LOS - 2  days   Medical records reviewed and are as summarized below:    Chief Complaint  Patient presents with   Abdominal Pain       Brief summary   Patient is a 65 year old female with history of diabetes mellitus, neuropathy, esophagitis, GERD presented with abdominal pain.  Patient reported pain mostly in the epigastric region for last 3 days, sudden onset, severe, intermittent and associated with poor oral intake and changes in the bowel habits.  She had a temp of 101.2 F at home.  Patient reported intermittent headache, back pain and had a fall about 3 weeks ago that resulted in lumbar spine compression fracture. In ED, patient was noted to have elevated LFTs with alk phos 263, AST 78, ALT 1506 CT imaging, ultrasound concerning for cholelithiasis, choledocholithiasis with mild CBD dilatation.  GI was consulted and patient was admitted for further work-up.  Assessment & Plan    Principal Problem:   Abdominal pain, epigastric -Concerning for possible gallbladder issue.  CT and ultrasound concerning for cholelithiasis, choledocholithiasis with mild CBD dilatation -MRCP showed gallbladder sludge without evidence of acute cholecystitis, no biliary obstruction or choledocholithiasis -Underwent laparoscopic cholecystectomy, postop day #1  -diet advanced to full liquids today.  Still feeling right upper quadrant abdominal pain. -LFTs trending down  Active Problems:   Type 2 diabetes mellitus with diabetic neuropathy, unspecified (HCC) uncontrolled with hypoglycemia and diabetic neuropathy -CBGs have been stable, hold  Trulicity, metformin, Invokana -Placed on sliding scale insulin while inpatient    GERD (gastroesophageal reflux disease) -Continue Protonix    Coronary artery disease involving native coronary artery of native heart without angina pectoris -Currently no acute cardiac symptoms, no chest pain or shortness of breath  Acute compression fracture of L4 from recent mechanical fall -Continue pain control, fall precautions. -Patient reports ambulating in the hallway   Code Status: Full CODE STATUS DVT Prophylaxis:  heparin injection 5,000 Units Start: 05/11/21 2200 SCD's Start: 05/11/21 1854 SCDs Start: 05/10/21 1329   Level of Care: Level of care: Med-Surg Family Communication: Discussed all imaging results, lab results, explained to the patient    Disposition Plan:     Status is: Inpatient  Remains inpatient appropriate because:Inpatient level of care appropriate due to severity of illness  Dispo: The patient is from: Home              Anticipated d/c is to: Home              Patient currently is not medically stable to d/c.  Feels somewhat miserable today due to pain in the right upper quadrant, difficulty breathing due to pain   Difficult to place patient No      Time Spent  in minutes   25 minutes  Procedures:  MRCP Laparoscopic cholecystectomy  Consultants:   Gastroenterology General surgery  Antimicrobials:   Anti-infectives (From admission, onward)    Start     Dose/Rate Route Frequency Ordered Stop   05/10/21 1400  piperacillin-tazobactam (ZOSYN) IVPB 3.375 g        3.375 g 12.5 mL/hr over 240 Minutes Intravenous Every 8 hours 05/10/21 1232     05/09/21 2215  piperacillin-tazobactam (ZOSYN) IVPB 3.375 g        3.375 g 100 mL/hr over 30 Minutes Intravenous  Once 05/09/21 2213 05/10/21 0244          Medications  Scheduled Meds:  gabapentin  1,200 mg Oral TID   heparin injection (subcutaneous)  5,000 Units Subcutaneous Q8H   levothyroxine  112 mcg  Oral QAC breakfast   loratadine  10 mg Oral Daily   mupirocin ointment  1 application Nasal BID   pantoprazole (PROTONIX) IV  40 mg Intravenous QHS   Continuous Infusions:  dextrose 5 % and 0.45 % NaCl with KCl 20 mEq/L     piperacillin-tazobactam (ZOSYN)  IV 3.375 g (05/12/21 1409)   PRN Meds:.acetaminophen, cyclobenzaprine, fentaNYL (SUBLIMAZE) injection, metoprolol tartrate, nitroGLYCERIN, ondansetron **OR** ondansetron (ZOFRAN) IV, oxyCODONE      Subjective:   Savannah [name] was seen and examined today.  Still has RUQ abdominal pain, feeling somewhat miserable.  Unable to take deep breath due to pain.  No nausea or vomiting.  Ambulating in the hallway.   Objective:   Vitals:   05/11/21 2144 05/12/21 0448 05/12/21 0700 05/12/21 1422  BP: 133/72 100/62  125/65  Pulse: 69 74  78  Resp: _0 Temp: 97.9 F (36.6 C) 98 F (36.7 C)  98 F (36.7 C)  TempSrc: Oral Oral  Oral  SpO2: 97% 96% 95% 94%  Weight:      Height:        Intake/Output Summary (Last 24 hours) at 05/12/2021 1443 Last data filed at 05/12/2021 1000 Gross per 24 hour  Intake 1110.58 ml  Output 1700 ml  Net -589.42 ml     Wt Readings from Last 3 Encounters:  05/10/21 75 kg  04/12/21 76.3 kg  02/13/21 76.1 kg   Physical Exam General: Alert and oriented x 3, NAD Cardiovascular: S1 S2 clear, RRR. No pedal edema b/l Respiratory: CTAB, no wheezing, rales or rhonchi Gastrointestinal: Soft, epigastric/RUQ TTP, incision CDI, dressing intact Ext: no pedal edema bilaterally Neuro: no new deficits Psych: Normal affect and demeanor, alert and oriented x3    Data Reviewed:  I have personally reviewed following labs and imaging studies  Micro Results Recent Results (from the past 240 hour(s))  Novel Coronavirus, NAA ([facility])     Status: None   Collection Time: 05/09/21  2:58 PM   Specimen: Nasopharyngeal(NP) swabs in vial transport medium   Nasopharynge  Testing  Result Value Ref Range Status    SARS-CoV-2, NAA Not Detected Not Detected Final    Comment: This nucleic acid amplification test was developed and its performance characteristics determined by [employer]. Nucleic acid amplification tests include RT-PCR and TMA. This test has not been FDA cleared or approved. This test has been authorized by FDA under an Emergency Use Authorization (EUA). This test is only authorized for the duration of time the declaration that circumstances exist justifying the authorization of the emergency use of in vitro diagnostic tests for detection of SARS-CoV-2 virus and/or diagnosis of COVID-19 infection under  section 564(b)(1) of the Act, 21 U.S.C. 001VCB-4(W) (1), unless the authorization is terminated or revoked sooner. When diagnostic testing is negative, the possibility of a false negative result should be considered in the context of a patient's recent exposures and the presence of clinical signs and symptoms consistent with COVID-19. An individual without symptoms of COVID-19 and who is not shedding SARS-CoV-2 virus wo uld expect to have a negative (not detected) result in this assay.   SARS-COV-2, NAA 2 DAY TAT     Status: None   Collection Time: 05/09/21  2:58 PM   Nasopharynge  Testing  Result Value Ref Range Status   SARS-CoV-2, NAA 2 DAY TAT Performed  Final  Urine culture     Status: Abnormal   Collection Time: 05/09/21  7:33 PM   Specimen: In/Out Cath Urine  Result Value Ref Range Status   Specimen Description   Final    IN/OUT CATH URINE Performed at [facility], [address] [place], [place] [address]    Special Requests   Final    NONE Performed at [facility], [address], [place], [place] [address]    Culture 80,000 COLONIES/mL ENTEROCOCCUS FAECALIS (A)  Final   Report Status 05/12/2021 FINAL  Final   Organism ID, Bacteria ENTEROCOCCUS FAECALIS (A)  Final      Susceptibility   Enterococcus faecalis - MIC*     AMPICILLIN <=2 SENSITIVE Sensitive     NITROFURANTOIN <=16 SENSITIVE Sensitive     VANCOMYCIN <=0.5 SENSITIVE Sensitive     * 80,000 COLONIES/mL ENTEROCOCCUS FAECALIS  Culture, blood (Routine X 2) w Reflex to ID Panel     Status: None (Preliminary result)   Collection Time: 05/09/21 10:20 PM   Specimen: BLOOD  Result Value Ref Range Status   Specimen Description   Final    BLOOD RIGHT ANTECUBITAL Performed at [facility], [address], [place], [place] [address]    Special Requests   Final    BOTTLES DRAWN AEROBIC AND ANAEROBIC Blood Culture adequate volume Performed at [facility], [address], [place], [place] [address]    Culture   Final    NO GROWTH 2 DAYS Performed at [facility], [place] [address] [place], [place] [address]    Report Status PENDING  Incomplete  Culture, blood (Routine X 2) w Reflex to ID Panel     Status: None (Preliminary result)   Collection Time: 05/09/21 11:00 PM   Specimen: BLOOD  Result Value Ref Range Status   Specimen Description   Final    BLOOD BOTTLES DRAWN AEROBIC AND ANAEROBIC Performed at [facility], [address] [place], [place] [address]    Special Requests   Final    BLOOD LEFT FOREARM Blood Culture adequate volume Performed at [facility], [address], [place], [place] [address]    Culture   Final    NO GROWTH 2 DAYS Performed at [facility], [place] [address] [place], [place] [address]    Report Status PENDING  Incomplete  Resp Panel by RT-PCR (Flu A&B, Covid) Nasopharyngeal Swab     Status: None   Collection Time: 05/10/21  1:46 AM   Specimen: Nasopharyngeal Swab; Nasopharyngeal(NP) swabs in vial transport medium  Result Value Ref Range Status   SARS Coronavirus 2 by RT PCR NEGATIVE NEGATIVE Final    Comment: (NOTE) SARS-CoV-2 target nucleic acids are NOT DETECTED.  The SARS-CoV-2 RNA is generally detectable in  upper  respiratory specimens during the acute phase of infection. The lowest concentration of SARS-CoV-2 viral copies this assay can detect is 138 copies/mL. A negative result does not preclude SARS-Cov-2 infection and should not be used as the sole basis for treatment or other patient management decisions. A negative result may occur with  improper specimen collection/handling, submission of specimen other than nasopharyngeal swab, presence of viral mutation(s) within the areas targeted by this assay, and inadequate number of viral copies(<138 copies/mL). A negative result must be combined with clinical observations, patient history, and epidemiological information. The expected result is Negative.  Fact Sheet for Patients:  EntrepreneurPulse.com.au  Fact Sheet for Healthcare Providers:  IncredibleEmployment.be  This test is no t yet approved or cleared by the [place] FDA and  has been authorized for detection and/or diagnosis of SARS-CoV-2 by FDA under an Emergency Use Authorization (EUA). This EUA will remain  in effect (meaning this test can be used) for the duration of the COVID-19 declaration under Section 564(b)(1) of the Act, 21 U.S.C.section 360bbb-3(b)(1), unless the authorization is terminated  or revoked sooner.       Influenza A by PCR NEGATIVE NEGATIVE Final   Influenza B by PCR NEGATIVE NEGATIVE Final    Comment: (NOTE) The Xpert Xpress SARS-CoV-2/FLU/RSV plus assay is intended as an aid in the diagnosis of influenza from Nasopharyngeal swab specimens and should not be used as a sole basis for treatment. Nasal washings and aspirates are unacceptable for Xpert Xpress SARS-CoV-2/FLU/RSV testing.  Fact Sheet for Patients: EntrepreneurPulse.com.au  Fact Sheet for Healthcare Providers: IncredibleEmployment.be  This test is not yet approved or cleared by the [place] FDA and has been  authorized for detection and/or diagnosis of SARS-CoV-2 by FDA under an Emergency Use Authorization (EUA). This EUA will remain in effect (meaning this test can be used) for the duration of the COVID-19 declaration under Section 564(b)(1) of the Act, 21 U.S.C. section 360bbb-3(b)(1), unless the authorization is terminated or revoked.  Performed at [facility], [address], [place], [place] [address]   Surgical PCR screen     Status: None   Collection Time: 05/11/21  1:49 PM   Specimen: Nasal Mucosa; Nasal Swab  Result Value Ref Range Status   MRSA, PCR NEGATIVE NEGATIVE Final   Staphylococcus aureus NEGATIVE NEGATIVE Final    Comment: (NOTE) The Xpert SA Assay (FDA approved for NASAL specimens in patients 47 years of age and older), is one component of a comprehensive surveillance program. It is not intended to diagnose infection nor to guide or monitor treatment. Performed at [facility], [place] [address] [place], [place] [address]     Radiology Reports CT Abdomen Pelvis W Contrast  Result Date: 05/09/2021 CLINICAL DATA:  65 year old female with abdominal pain and fever. EXAM: CT ABDOMEN AND PELVIS WITH CONTRAST TECHNIQUE: Multidetector CT imaging of the abdomen and pelvis was performed using the standard protocol following bolus administration of intravenous contrast. CONTRAST:  136m OMNIPAQUE IOHEXOL 300 MG/ML  SOLN COMPARISON:  CT abdomen pelvis dated 12/16/2020. FINDINGS: Lower chest: The visualized lung bases are clear. There is coronary vascular calcification. Small calcified granuloma in the lingula. No intra-abdominal free air or free fluid. Hepatobiliary: Probable fatty liver. There is mild intrahepatic biliary dilatation. There is a cluster of sludge or small stones in the gallbladder. The gallbladder is distended. Probable small pericholecystic fluid. Ultrasound may provide better evaluation if there is high clinical concern for  acute cholecystitis. Pancreas: Unremarkable. No pancreatic  ductal dilatation or surrounding inflammatory changes. Spleen: Small scattered calcified splenic granuloma. Adrenals/Urinary Tract: The adrenal glands are unremarkable. Mild left renal parenchyma atrophy. There is a 2 cm cyst from the posterior interpolar right kidney. There is no hydronephrosis on either side. There is symmetric enhancement and excretion of contrast by both kidneys. The visualized ureters and urinary bladder appear unremarkable. Stomach/Bowel: Postsurgical changes of gastric bypass. There is no bowel obstruction or active inflammation. The appendix is not visualized with certainty. No inflammatory changes identified in the right lower quadrant. Vascular/Lymphatic: Mild aortoiliac atherosclerotic disease. The IVC is unremarkable. No portal venous gas. There is no adenopathy. Reproductive: Hysterectomy. No adnexal masses. Other: Anterior pelvic wall C-section scar. Musculoskeletal: Osteopenia with degenerative changes of the spine. There is acute appearance compression fracture of superior endplate of L4 with approximately 20% loss of vertebral body height. Correlation with clinical exam and point tenderness recommended. No retropulsion. IMPRESSION: 1. Acute compression fracture of superior endplate of L4. No retropulsion. 2. Gallstone/sludge with probable small pericholecystic fluid. Ultrasound may provide better evaluation if there is high clinical concern for acute cholecystitis. 3. Aortic Atherosclerosis (ICD10-I70.0). Electronically Signed   By: Anner Crete M.D.   On: 05/09/2021 22:53   MR 3D Recon At Scanner  Result Date: 05/11/2021 CLINICAL DATA:  Elevated liver function test. Gallstones/sludge demonstrated on ultrasound. Mild gallbladder wall thickening. EXAM: MRI ABDOMEN WITHOUT AND WITH CONTRAST (INCLUDING MRCP) TECHNIQUE: Multiplanar multisequence MR imaging of the abdomen was performed both before and after the  administration of intravenous contrast. Heavily T2-weighted images of the biliary and pancreatic ducts were obtained, and three-dimensional MRCP images were rendered by post processing. CONTRAST:  7.55m GADAVIST GADOBUTROL 1 MMOL/ML IV SOLN COMPARISON:  Ultrasound 05/10/2021, CT 05/09/2021 FINDINGS: Lower chest:  Lung bases are clear. Hepatobiliary: Normal liver parenchyma. No intrahepatic or extrahepatic biliary duct dilatation. The common hepatic duct in the common bile duct normal caliber. Small amount of sludge layers within the lumen of the gallbladder (image 25/series 3. No evidence of gallbladder inflammation. No wall thickening. Gallbladder is mildly distended at 4.2 cm. Pancreas: Normal pancreatic parenchymal intensity. No ductal dilatation or inflammation. Spleen: Normal spleen. Adrenals/urinary tract: Adrenal glands normal. Nonenhancing cyst of the RIGHT kidney noted. Stomach/Bowel: Stomach and limited of the small bowel is unremarkable Vascular/Lymphatic: Abdominal aortic normal caliber. No retroperitoneal periportal lymphadenopathy. Musculoskeletal: No aggressive osseous lesion IMPRESSION: 1. Gallbladder sludge without evidence acute cholecystitis. 2. No choledocholithiasis or biliary obstruction. 3. Normal liver parenchyma.  Normal pancreas Electronically Signed   By: SSuzy BouchardM.D.   On: 05/11/2021 08:14   UKoreaAbdomen Limited  Result Date: 05/10/2021 CLINICAL DATA:  Fever.  Elevated LFTs, abnormal CT EXAM: ULTRASOUND ABDOMEN LIMITED RIGHT UPPER QUADRANT COMPARISON:  CT 05/09/2021 FINDINGS: Gallbladder: Gallbladder is distended. Multiple mobile stones and sludge within the gallbladder. Gallbladder wall is mildly thickened at 4 mm. Trace pericholecystic fluid. Negative sonographic Murphy's. Common bile duct: Diameter: Mildly dilated, 10 mm. The distal duct is poorly visualized due to overlying bowel gas. Liver: No focal lesion identified. Within normal limits in parenchymal echogenicity.  Portal vein is patent on color Doppler imaging with normal direction of blood flow towards the liver. Other: None. IMPRESSION: Cholelithiasis. Gallbladder distension. Sludge present as well as mild wall thickening and trace pericholecystic fluid. Recommend clinical correlation to exclude acute cholecystitis. Mildly dilated common bile duct, 10 mm. Distal duct cannot be visualized due to overlying bowel gas. Electronically Signed   By: KRolm BaptiseM.D.   On: 05/10/2021  01:16   DG Chest Port 1 View  Result Date: 05/09/2021 CLINICAL DATA:  Chest pain, dyspnea EXAM: PORTABLE CHEST 1 VIEW COMPARISON:  12/16/2020 FINDINGS: Lungs are clear. No pneumothorax or pleural effusion. Cardiac size within normal limits. Pulmonary vascularity is normal. Numerous surgical clips are again seen at the thoracic inlet likely related to prior bright red ectomy. Remote right seventh rib healed fracture noted. No acute bone abnormality. IMPRESSION: No active disease. Electronically Signed   By: Fidela Salisbury MD   On: 05/09/2021 23:06   DG C-Arm 1-60 Min-No Report  Result Date: 05/11/2021 Fluoroscopy was utilized by the requesting physician.  No radiographic interpretation.   MR ABDOMEN MRCP W WO CONTAST  Result Date: 05/11/2021 CLINICAL DATA:  Elevated liver function test. Gallstones/sludge demonstrated on ultrasound. Mild gallbladder wall thickening. EXAM: MRI ABDOMEN WITHOUT AND WITH CONTRAST (INCLUDING MRCP) TECHNIQUE: Multiplanar multisequence MR imaging of the abdomen was performed both before and after the administration of intravenous contrast. Heavily T2-weighted images of the biliary and pancreatic ducts were obtained, and three-dimensional MRCP images were rendered by post processing. CONTRAST:  7.59m GADAVIST GADOBUTROL 1 MMOL/ML IV SOLN COMPARISON:  Ultrasound 05/10/2021, CT 05/09/2021 FINDINGS: Lower chest:  Lung bases are clear. Hepatobiliary: Normal liver parenchyma. No intrahepatic or extrahepatic biliary duct  dilatation. The common hepatic duct in the common bile duct normal caliber. Small amount of sludge layers within the lumen of the gallbladder (image 25/series 3. No evidence of gallbladder inflammation. No wall thickening. Gallbladder is mildly distended at 4.2 cm. Pancreas: Normal pancreatic parenchymal intensity. No ductal dilatation or inflammation. Spleen: Normal spleen. Adrenals/urinary tract: Adrenal glands normal. Nonenhancing cyst of the RIGHT kidney noted. Stomach/Bowel: Stomach and limited of the small bowel is unremarkable Vascular/Lymphatic: Abdominal aortic normal caliber. No retroperitoneal periportal lymphadenopathy. Musculoskeletal: No aggressive osseous lesion IMPRESSION: 1. Gallbladder sludge without evidence acute cholecystitis. 2. No choledocholithiasis or biliary obstruction. 3. Normal liver parenchyma.  Normal pancreas Electronically Signed   By: SSuzy BouchardM.D.   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  XR Lumbar Spine 2-3 Views  Result Date: 04/18/2021 L4 superior endplate changes questionable compression fracture versus Schmorl's nodes.   Lab Data:  CBC: Recent Labs  Lab 05/09/21 1933 05/11/21 0545 05/11/21 1947 05/12/21 0509  WBC 2.6*  2.7* 2.3* 3.8* 5.8  NEUTROABS 1.9  --   --   --   HGB 12.9  12.9 11.4* 12.8 11.5*  HCT 40.7  40.4 36.1 40.6 35.9*  MCV 100.0  99.3 101.1* 103.0* 100.0  PLT 140*  134* 112* 111* 1568   Basic Metabolic Panel: Recent Labs  Lab 05/09/21 1933 05/10/21 1340 05/11/21 0545 05/11/21 1947 05/12/21 0509  NA 140 139 140  --  138  K 4.3 3.9 4.0  --  4.5  CL 104 106 108  --  106  CO2 _0 --  19*  GLUCOSE 120* 175* 90  --  142*  BUN _1 --  14  CREATININE 1.11* 0.95 1.00 1.05* 1.02*  CALCIUM 9.6 8.7* 8.5*  --  8.4*   GFR: Estimated Creatinine Clearance: 59.8 mL/min (A) (by C-G formula based on SCr of 1.02 mg/dL  (H)). Liver Function Tests: Recent Labs  Lab 05/09/21 1933 05/10/21 1340 05/11/21 0545 05/12/21 0509  AST 788* 328* 162* 111*  ALT 1,506* 950* 673* 501*  ALKPHOS 263* 207* 185* 179*  BILITOT 1.1 0.8 1.2  1.3*  PROT 6.0* 5.6* 5.3* 5.5*  ALBUMIN 3.8 3.1* 3.0* 3.1*   Recent Labs  Lab 05/09/21 1933  LIPASE 31   No results for input(s): AMMONIA in the last 168 hours. Coagulation Profile: No results for input(s): INR, PROTIME in the last 168 hours. Cardiac Enzymes: Recent Labs  Lab 05/10/21 1345  CKTOTAL 21*   BNP (last 3 results) No results for input(s): PROBNP in the last 8760 hours. HbA1C: No results for input(s): HGBA1C in the last 72 hours. CBG: Recent Labs  Lab 05/11/21 2015 05/11/21 2349 05/12/21 0445 05/12/21 0743 05/12/21 1210  GLUCAP 233* 186* 138* 114* 154*   Lipid Profile: No results for input(s): CHOL, HDL, LDLCALC, TRIG, CHOLHDL, LDLDIRECT in the last 72 hours. Thyroid Function Tests: No results for input(s): TSH, T4TOTAL, FREET4, T3FREE, THYROIDAB in the last 72 hours. Anemia Panel: No results for input(s): VITAMINB12, FOLATE, FERRITIN, TIBC, IRON, RETICCTPCT in the last 72 hours. Urine analysis:    Component Value Date/Time   COLORURINE YELLOW 05/09/2021 1933   APPEARANCEUR CLEAR 05/09/2021 1933   LABSPEC 1.030 05/09/2021 1933   PHURINE 5.5 05/09/2021 1933   GLUCOSEU >1,000 (A) 05/09/2021 1933   GLUCOSEU >=1000 (A) 01/11/2017 0933   HGBUR NEGATIVE 05/09/2021 1933   HGBUR negative 12/02/2009 0841   BILIRUBINUR NEGATIVE 05/09/2021 1933   KETONESUR TRACE (A) 05/09/2021 1933   PROTEINUR 30 (A) 05/09/2021 1933   UROBILINOGEN 0.2 01/11/2017 0933   NITRITE NEGATIVE 05/09/2021 1933   LEUKOCYTESUR NEGATIVE 05/09/2021 1933     Sondi Desch M.D. [facility] 05/12/2021, 2:43 PM  Available via Epic secure chat 7am-7pm After 7 pm, please refer to night coverage provider listed on amion.

## 2021-05-12 NOTE — Discharge Instructions (Signed)
[facility]  Please arrive at least 30 min before your appointment to complete your check in paperwork.  If you are unable to arrive 30 min prior to your appointment time we may have to cancel or reschedule you. LAPAROSCOPIC SURGERY: POST OP INSTRUCTIONS Always review your discharge instruction sheet given to you by the facility where your surgery was performed. IF YOU HAVE DISABILITY OR FAMILY LEAVE FORMS, YOU MUST BRING THEM TO THE OFFICE FOR PROCESSING.   DO NOT GIVE THEM TO YOUR DOCTOR.  PAIN CONTROL  First take acetaminophen (Tylenol) AND/or ibuprofen (Advil) to control your pain after surgery.  Follow directions on package.  Taking acetaminophen (Tylenol) and/or ibuprofen (Advil) regularly after surgery will help to control your pain and lower the amount of prescription pain medication you may need.  You should not take more than 4,000 mg (4 grams) of acetaminophen (Tylenol) in 24 hours.  You should not take ibuprofen (Advil), aleve, motrin, naprosyn or other NSAIDS if you have a history of stomach ulcers or chronic kidney disease.  A prescription for pain medication may be given to you upon discharge.  Take your pain medication as prescribed, if you still have uncontrolled pain after taking acetaminophen (Tylenol) or ibuprofen (Advil). Use ice packs to help control pain. If you need a refill on your pain medication, please contact your pharmacy.  They will contact our office to request authorization. Prescriptions will not be filled after 5pm or on week-ends.  HOME MEDICATIONS Take your usually prescribed medications unless otherwise directed.  DIET You should follow a light diet the first few days after arrival home.  Be sure to include lots of fluids daily. Avoid fatty, fried foods.   CONSTIPATION It is common to experience some constipation after surgery and if you are taking pain medication.  Increasing fluid intake and taking a stool softener (such as Colace)  will usually help or prevent this problem from occurring.  A mild laxative (Milk of Magnesia or Miralax) should be taken according to package instructions if there are no bowel movements after 48 hours.  WOUND/INCISION CARE Most patients will experience some swelling and bruising in the area of the incisions.  Ice packs will help.  Swelling and bruising can take several days to resolve.  Unless discharge instructions indicate otherwise, follow guidelines below  STERI-STRIPS - you may remove your outer bandages 48 hours after surgery, and you may shower at that time.  You have steri-strips (small skin tapes) in place directly over the incision.  These strips should be left on the skin for 7-10 days.   DERMABOND/SKIN GLUE - you may shower in 24 hours.  The glue will flake off over the next 2-3 weeks. Any sutures or staples will be removed at the office during your follow-up visit.  ACTIVITIES You may resume regular (light) daily activities beginning the next day--such as daily self-care, walking, climbing stairs--gradually increasing activities as tolerated.  You may have sexual intercourse when it is comfortable.  Refrain from any heavy lifting or straining until approved by your doctor. You may drive when you are no longer taking prescription pain medication, you can comfortably wear a seatbelt, and you can safely maneuver your car and apply brakes.  FOLLOW-UP You should see your doctor in the office for a follow-up appointment approximately 2-3 weeks after your surgery.  You should have been given your post-op/follow-up appointment when your surgery was scheduled.  If you did not receive a post-op/follow-up appointment, make sure  that you call for this appointment within a day or two after you arrive home to insure a convenient appointment time.   WHEN TO CALL YOUR DOCTOR: Fever over 101.0 Inability to urinate Continued bleeding from incision. Increased pain, redness, or drainage from the  incision. Increasing abdominal pain  The clinic staff is available to answer your questions during regular business hours.  Please don't hesitate to call and ask to speak to one of the nurses for clinical concerns.  If you have a medical emergency, go to the nearest emergency room or call 911.  A surgeon from [facility] is always on call at the hospital. [address] [place], [place], [place]  [address] ? [address], [place], [place]   [address] [phone] ? [phone] ? FAX [phone]

## 2021-05-12 NOTE — Progress Notes (Signed)
1 Day Post-Op  Subjective: CC: Having a good bit of soreness of her epigastrium. Tolerating cld without increased abdominal pain, or associated n/v. Mobilizing in halls. Voiding. Feels she would benefit from staying another night  Objective: Vital signs in last 24 hours: Temp:  [97.8 F (36.6 C)-98.1 F (36.7 C)] 98 F (36.7 C) (07/01 0448) Pulse Rate:  [66-81] 74 (07/01 0448) Resp:  [11-17] 14 (07/01 0448) BP: (100-144)/(61-76) 100/62 (07/01 0448) SpO2:  [95 %-100 %] 95 % (07/01 0700) Last BM Date: 05/08/21  Intake/Output from previous day: 06/30 0701 - 07/01 0700 In: 800 [I.V.:800] Out: 1425 [Urine:1200; Blood:75] Intake/Output this shift: Total I/O In: -  Out: 600 [Urine:600]  PE: Gen:  Alert, NAD, pleasant Card:  Reg Pulm:  Rate and effort normal  Abd: Soft, mild distension, appropriately tender around laparoscopic incisions with some tenderness of the epigastrium. No peritonitis. +BS Psych: A&Ox3  Skin: no rashes noted, warm and dry   Lab Results:  Recent Labs    05/11/21 1947 05/12/21 0509  WBC 3.8* 5.8  HGB 12.8 11.5*  HCT 40.6 35.9*  PLT 111* 136*   BMET Recent Labs    05/11/21 0545 05/11/21 1947 05/12/21 0509  NA 140  --  138  K 4.0  --  4.5  CL 108  --  106  CO2 23  --  19*  GLUCOSE 90  --  142*  BUN 12  --  14  CREATININE 1.00 1.05* 1.02*  CALCIUM 8.5*  --  8.4*   PT/INR No results for input(s): LABPROT, INR in the last 72 hours. CMP     Component Value Date/Time   NA 138 05/12/2021 0509   NA 144 07/06/2019 1524   K 4.5 05/12/2021 0509   CL 106 05/12/2021 0509   CO2 19 (L) 05/12/2021 0509   GLUCOSE 142 (H) 05/12/2021 0509   BUN 14 05/12/2021 0509   BUN 22 07/06/2019 1524   CREATININE 1.02 (H) 05/12/2021 0509   CREATININE 0.94 10/11/2020 1156   CALCIUM 8.4 (L) 05/12/2021 0509   CALCIUM 8.9 01/30/2013 1017   PROT 5.5 (L) 05/12/2021 0509   ALBUMIN 3.1 (L) 05/12/2021 0509   AST 111 (H) 05/12/2021 0509   AST 25 02/29/2020  1303   ALT 501 (H) 05/12/2021 0509   ALT 37 02/29/2020 1303   ALKPHOS 179 (H) 05/12/2021 0509   BILITOT 1.3 (H) 05/12/2021 0509   BILITOT 0.4 02/29/2020 1303   GFRNONAA >60 05/12/2021 0509   GFRNONAA 63 08/16/2020 1207   GFRAA 73 08/16/2020 1207   Lipase     Component Value Date/Time   LIPASE 31 05/09/2021 1933       Studies/Results: MR 3D Recon At Scanner  Result Date: 05/11/2021 CLINICAL DATA:  Elevated liver function test. Gallstones/sludge demonstrated on ultrasound. Mild gallbladder wall thickening. EXAM: MRI ABDOMEN WITHOUT AND WITH CONTRAST (INCLUDING MRCP) TECHNIQUE: Multiplanar multisequence MR imaging of the abdomen was performed both before and after the administration of intravenous contrast. Heavily T2-weighted images of the biliary and pancreatic ducts were obtained, and three-dimensional MRCP images were rendered by post processing. CONTRAST:  7.53m GADAVIST GADOBUTROL 1 MMOL/ML IV SOLN COMPARISON:  Ultrasound 05/10/2021, CT 05/09/2021 FINDINGS: Lower chest:  Lung bases are clear. Hepatobiliary: Normal liver parenchyma. No intrahepatic or extrahepatic biliary duct dilatation. The common hepatic duct in the common bile duct normal caliber. Small amount of sludge layers within the lumen of the gallbladder (image 25/series 3. No evidence of gallbladder inflammation. No  wall thickening. Gallbladder is mildly distended at 4.2 cm. Pancreas: Normal pancreatic parenchymal intensity. No ductal dilatation or inflammation. Spleen: Normal spleen. Adrenals/urinary tract: Adrenal glands normal. Nonenhancing cyst of the RIGHT kidney noted. Stomach/Bowel: Stomach and limited of the small bowel is unremarkable Vascular/Lymphatic: Abdominal aortic normal caliber. No retroperitoneal periportal lymphadenopathy. Musculoskeletal: No aggressive osseous lesion IMPRESSION: 1. Gallbladder sludge without evidence acute cholecystitis. 2. No choledocholithiasis or biliary obstruction. 3. Normal liver  parenchyma.  Normal pancreas Electronically Signed   By: Suzy Bouchard M.D.   On: 05/11/2021 08:14   DG C-Arm 1-60 Min-No Report  Result Date: 05/11/2021 Fluoroscopy was utilized by the requesting physician.  No radiographic interpretation.   MR ABDOMEN MRCP W WO CONTAST  Result Date: 05/11/2021 CLINICAL DATA:  Elevated liver function test. Gallstones/sludge demonstrated on ultrasound. Mild gallbladder wall thickening. EXAM: MRI ABDOMEN WITHOUT AND WITH CONTRAST (INCLUDING MRCP) TECHNIQUE: Multiplanar multisequence MR imaging of the abdomen was performed both before and after the administration of intravenous contrast. Heavily T2-weighted images of the biliary and pancreatic ducts were obtained, and three-dimensional MRCP images were rendered by post processing. CONTRAST:  7.81m GADAVIST GADOBUTROL 1 MMOL/ML IV SOLN COMPARISON:  Ultrasound 05/10/2021, CT 05/09/2021 FINDINGS: Lower chest:  Lung bases are clear. Hepatobiliary: Normal liver parenchyma. No intrahepatic or extrahepatic biliary duct dilatation. The common hepatic duct in the common bile duct normal caliber. Small amount of sludge layers within the lumen of the gallbladder (image 25/series 3. No evidence of gallbladder inflammation. No wall thickening. Gallbladder is mildly distended at 4.2 cm. Pancreas: Normal pancreatic parenchymal intensity. No ductal dilatation or inflammation. Spleen: Normal spleen. Adrenals/urinary tract: Adrenal glands normal. Nonenhancing cyst of the RIGHT kidney noted. Stomach/Bowel: Stomach and limited of the small bowel is unremarkable Vascular/Lymphatic: Abdominal aortic normal caliber. No retroperitoneal periportal lymphadenopathy. Musculoskeletal: No aggressive osseous lesion IMPRESSION: 1. Gallbladder sludge without evidence acute cholecystitis. 2. No choledocholithiasis or biliary obstruction. 3. Normal liver parenchyma.  Normal pancreas Electronically Signed   By: SSuzy BouchardM.D.   On: 05/11/2021 08:14     Anti-infectives: Anti-infectives (From admission, onward)    Start     Dose/Rate Route Frequency Ordered Stop   05/10/21 1400  piperacillin-tazobactam (ZOSYN) IVPB 3.375 g        3.375 g 12.5 mL/hr over 240 Minutes Intravenous Every 8 hours 05/10/21 1232     05/09/21 2215  piperacillin-tazobactam (ZOSYN) IVPB 3.375 g        3.375 g 100 mL/hr over 30 Minutes Intravenous  Once 05/09/21 2213 05/10/21 0244        Assessment/Plan S/p Laparoscopic Cholecystectomy with intraoperative cholangiogram for Acute cholecystitis with small CBD stones - Dr. MHassell Done- 05/11/21 - [place] of the CBD into the duodenum.  Multiple small black stones consistent with small CBD stones--likely will pass. LFT's this am w/ downtrending Alk phos, AST, ALT and overall stable T. Bili at 1.3 from 1.2 - Having a good bit of soreness this am. Will adjust her pain medications - Adv to FLD. AAT to reg diet if tolerates - Will recehck in the PM. If she stays overnight, would repeat labs in the AM given OR findings as noted above.  - Mobilize, pulm toilet  FEN - FLD VTE - SCDs, heparin subq ID - Zosyn   LOS: 2 days    MJillyn Ledger, PFayette [facility] CenterSur[facility] 05/12/2021, 9:38 AM Please see Amion for pager number during day hours 7:00am-4:30pm

## 2021-05-13 LAB — COMPREHENSIVE METABOLIC PANEL
ALT: 312 U/L — ABNORMAL HIGH (ref 0–44)
AST: 54 U/L — ABNORMAL HIGH (ref 15–41)
Albumin: 2.8 g/dL — ABNORMAL LOW (ref 3.5–5.0)
Alkaline Phosphatase: 146 U/L — ABNORMAL HIGH (ref 38–126)
Anion gap: 7 (ref 5–15)
BUN: 10 mg/dL (ref 8–23)
CO2: 25 mmol/L (ref 22–32)
Calcium: 8.3 mg/dL — ABNORMAL LOW (ref 8.9–10.3)
Chloride: 107 mmol/L (ref 98–111)
Creatinine, Ser: 0.87 mg/dL (ref 0.44–1.00)
GFR, Estimated: >60 mL/min (ref >60)
Glucose, Bld: 108 mg/dL — ABNORMAL HIGH (ref 70–99)
Potassium: 3.6 mmol/L (ref 3.5–5.1)
Sodium: 139 mmol/L (ref 135–145)
Total Bilirubin: 0.9 mg/dL (ref 0.3–1.2)
Total Protein: 5.3 g/dL — ABNORMAL LOW (ref 6.5–8.1)

## 2021-05-13 LAB — GLUCOSE, CAPILLARY: Glucose-Capillary: 108 mg/dL — ABNORMAL HIGH (ref 70–99)

## 2021-05-13 MED ORDER — OXYCODONE HCL 5 MG PO TABS: 5.0000 mg | ORAL_TABLET | Freq: Four times a day (QID) | ORAL | 0 refills | Status: DC | PRN

## 2021-05-13 MED ORDER — ONDANSETRON 4 MG PO TBDP: 4.0000 mg | ORAL_TABLET | Freq: Three times a day (TID) | ORAL | 0 refills | Status: DC | PRN

## 2021-05-13 MED ORDER — ACETAMINOPHEN 500 MG PO TABS: 1000.0000 mg | ORAL_TABLET | Freq: Four times a day (QID) | ORAL | 0 refills | Status: AC | PRN

## 2021-05-13 MED ORDER — AMOXICILLIN-POT CLAVULANATE 875-125 MG PO TABS: 1.0000 | ORAL_TABLET | Freq: Two times a day (BID) | ORAL | 0 refills | Status: AC

## 2021-05-13 NOTE — Plan of Care (Signed)

## 2021-05-13 NOTE — Discharge Summary (Signed)
Weeks   Physician Discharge Summary   Patient ID: Savannah [name] MRN: [id number] DOB/AGE: 1956/05/02 65 y.o.  Admit date: 05/09/2021 Discharge date: 05/13/2021  Primary Care Physician:  Vivi Barrack, MD   Recommendations for Outpatient Follow-up:  Follow up with PCP in 1-2 weeks Please check LFTs in 2 weeks for follow-up  Home Health: Ambulating without any difficulty Equipment/Devices:   Discharge Condition: stable  CODE STATUS: FULL Diet recommendation: Carb modified diet   Discharge Diagnoses:     Acute cholecystitis with small CBD stones status post laparoscopic cholecystectomy  Recent acute compression fracture L4 from mechanical fall  Coronary artery disease involving native coronary artery of native heart without angina pectoris  Depression  GERD (gastroesophageal reflux disease)  Type 2 diabetes mellitus with diabetic neuropathy, unspecified ([place])   Consults: Gastroenterology General surgery    Allergies:   Allergies  Allergen Reactions   Ace Inhibitors Cough   Caffeine     PVCs   Ciprofloxacin Itching   Morphine And Related Itching    Pt tolerates medication if given with diphenhydramine   Mucinex [Guaifenesin Er]     PVCs   Nsaids     Gastric Bypass Surgery - unable to take, tolerates ec 81 aspirin    Other     Antihistamine-alkylamine - PVCs tolerates benadryl    Silicone Hives   Tape Hives     DISCHARGE MEDICATIONS: Allergies as of 05/13/2021       Reactions   Ace Inhibitors Cough   Caffeine    PVCs   Ciprofloxacin Itching   Morphine And Related Itching   Pt tolerates medication if given with diphenhydramine   Mucinex [guaifenesin Er]    PVCs   Nsaids    Gastric Bypass Surgery - unable to take, tolerates ec 81 aspirin    Other    Antihistamine-alkylamine - PVCs tolerates benadryl    Silicone Hives   Tape Hives        Medication List     STOP taking these medications    atorvastatin 40 MG tablet Commonly known as:  LIPITOR   traMADol 50 MG tablet Commonly known as: ULTRAM       TAKE these medications    acetaminophen 500 MG tablet Commonly known as: TYLENOL Take 2 tablets (1,000 mg total) by mouth every 6 (six) hours as needed for mild pain or fever.   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  Correct leg length and improve pronation BL   amoxicillin 500 MG capsule Commonly known as: AMOXIL Take 1,000 mg by mouth as directed. For dental appointment   amoxicillin-clavulanate 875-125 MG tablet Commonly known as: Augmentin Take 1 tablet by mouth 2 (two) times daily for 5 days.   aspirin EC 81 MG tablet Take 1 tablet (81 mg total) by mouth daily. Starting 01/18/19 prior to cardiac cath   Colchicine 0.6 MG Caps TAKE 1 CAPSULE BY MOUTH EVERY DAY What changed: how much to take   cyclobenzaprine 10 MG tablet Commonly known as: FLEXERIL Take 10 mg by mouth at bedtime as needed for muscle spasms.   desloratadine 5 MG tablet Commonly known as: CLARINEX Take 5 mg by mouth daily.   diclofenac sodium 1 % Gel Commonly known as: VOLTAREN Apply 4 g topically 4 (four) times daily. What changed:  when to take this reasons to take this   DIFLUCAN PO Take 200 mg by mouth daily as needed (infection).   docusate sodium 100 MG capsule Commonly  known as: COLACE Take 200 mg by mouth 2 (two) times daily.   gabapentin 300 MG capsule Commonly known as: NEURONTIN TAKE 4 CAPSULES BY MOUTH 3 TIMES DAILY. What changed: See the new instructions.   halobetasol 0.05 % cream Commonly known as: ULTRAVATE Apply 1 application topically 2 (two) times daily as needed (lichen sclerosus).   Invokana 300 MG Tabs tablet Generic drug: canagliflozin TAKE 1 TABLET (300 MG TOTAL) BY MOUTH DAILY BEFORE BREAKFAST. What changed:  how much to take when to take this   levothyroxine 112 MCG tablet Commonly known as: SYNTHROID TAKE 1 TABLET BY MOUTH EVERY DAY What changed: when  to take this   lidocaine 5 % Commonly known as: Lidoderm Place 1 patch onto the skin daily. Remove & Discard patch within 12 hours or as directed by MD   MAGNESIUM PO Take 4 tablets by mouth daily as needed (cramps).   metFORMIN 500 MG 24 hr tablet Commonly known as: GLUCOPHAGE-XR TAKE 4 TABLETS BY MOUTH DAILY. What changed:  how much to take when to take this   multivitamin tablet Take 1 tablet by mouth in the morning and at bedtime.   nitroGLYCERIN 0.4 MG SL tablet Commonly known as: NITROSTAT Place 1 tablet (0.4 mg total) under the tongue every 5 (five) minutes as needed.   ondansetron 4 MG disintegrating tablet Commonly known as: Zofran ODT Take 1 tablet (4 mg total) by mouth every 8 (eight) hours as needed for nausea or vomiting.   OneTouch Ultra test strip Generic drug: glucose blood USE TO MONITOR GLUCOSE LEVELS ONCE PER DAY E11.40   oxyCODONE 5 MG immediate release tablet Commonly known as: Oxy IR/ROXICODONE Take 1 tablet (5 mg total) by mouth every 6 (six) hours as needed for up to 7 days for moderate pain or severe pain.   pantoprazole 40 MG tablet Commonly known as: PROTONIX TAKE 1 TABLET BY MOUTH TWICE A DAY   phenazopyridine 100 MG tablet Commonly known as: PYRIDIUM Take 100 mg by mouth 3 (three) times daily as needed for pain.   Probiotic Caps Take 1 capsule by mouth daily.   repaglinide 2 MG tablet Commonly known as: PRANDIN Take 1 tablet (2 mg total) by mouth 2 (two) times daily before a meal.   Rib Belt/Womens Medium Misc Use as needed for rib pain   Trulicity 3 XL/2.4MW Sopn Generic drug: Dulaglutide Inject 3 mg as directed once a week.   Vitamin D 50 MCG (2000 UT) tablet Take 4,000 Units by mouth daily.               Discharge Care Instructions  (From admission, onward)           Start     Ordered   05/13/21 0000  If the dressing is still on your incision site when you go home, remove it on the third day after your surgery  date. Remove dressing if it begins to fall off, or if it is dirty or damaged before the third day.        05/13/21 1009             Brief H and P: For complete details please refer to admission H and P, but in brief  Patient is a 65 year old female with history of diabetes mellitus, neuropathy, esophagitis, GERD presented with abdominal pain.  Patient reported pain mostly in the epigastric region for last 3 days, sudden onset, severe, intermittent and associated with poor oral intake and changes in the  bowel habits.  She had a temp of 101.2 F at home.  Patient reported intermittent headache, back pain and had a fall about 3 weeks ago that resulted in lumbar spine compression fracture. In ED, patient was noted to have elevated LFTs with alk phos 263, AST 78, ALT 1506 CT imaging, ultrasound concerning for cholelithiasis, choledocholithiasis with mild CBD dilatation.  GI was consulted and patient was admitted for further work-up.  Hospital Course:   Acute cholecystitis with small CBD stones -Presented with severe abdominal pain, fevers, elevated LFTs, initially concerning for choledocholithiasis with CBD obstruction.. - CT and ultrasound concerning for cholelithiasis, choledocholithiasis with mild CBD dilatation -MRCP showed gallbladder sludge, no biliary obstruction or choledocholithiasis -General surgery was consulted, underwent laparoscopic cholecystectomy, postop day #2  -Now afebrile, LFTs improving. -Pain is improving, tolerating solid diet, ambulating without difficulty.  Cleared by surgery for discharge home.      Type 2 diabetes mellitus with diabetic neuropathy, unspecified ([facility]) uncontrolled with hypoglycemia and diabetic neuropathy -Resume Trulicity, metformin, Invokana -Placed on sliding scale insulin while inpatient     GERD (gastroesophageal reflux disease) -Continue PPI     Coronary artery disease involving native coronary artery of native heart without angina  pectoris -Currently no acute cardiac symptoms, no chest pain or shortness of breath   Acute compression fracture of L4 from recent mechanical fall -Continue pain control with fall precautions.  Ambulating in the hallway without any difficulty. Recommended outpatient PT    Day of Discharge S: RUQ pain is improving still has intermittent episodes.  Afebrile.  BP 125/66 (BP Location: Left Arm)   Pulse 70   Temp 98.1 F (36.7 C) (Oral)   Resp 16   Ht _0  (1.803 m)   Wt 75 kg   SpO2 96%   BMI 23.06 kg/m   Physical Exam: General: Alert and awake oriented x3 not in any acute distress. CVS: S1-S2 clear no murmur rubs or gallops Chest: clear to auscultation bilaterally, no wheezing rales or rhonchi Abdomen: soft nontender, incision CDI Extremities: no cyanosis, clubbing or edema noted bilaterally Neuro: no neuro deficits    Get Medicines reviewed and adjusted: Please take all your medications with you for your next visit with your Primary MD  Please request your Primary MD to go over all hospital tests and procedure/radiological results at the follow up. Please ask your Primary MD to get all Hospital records sent to his/her office.  If you experience worsening of your admission symptoms, develop shortness of breath, life threatening emergency, suicidal or homicidal thoughts you must seek medical attention immediately by calling 911 or calling your MD immediately  if symptoms less severe.  You must read complete instructions/literature along with all the possible adverse reactions/side effects for all the Medicines you take and that have been prescribed to you. Take any new Medicines after you have completely understood and accept all the possible adverse reactions/side effects.   Do not drive when taking pain medications.   Do not take more than prescribed Pain, Sleep and Anxiety Medications  Special Instructions: If you have smoked or chewed Tobacco  in the last 2 yrs please  stop smoking, stop any regular Alcohol  and or any Recreational drug use.  Wear Seat belts while driving.  Please note  You were cared for by a hospitalist during your hospital stay. Once you are discharged, your primary care physician will handle any further medical issues. Please note that NO REFILLS for any discharge medications will be authorized  once you are discharged, as it is imperative that you return to your primary care physician (or establish a relationship with a primary care physician if you do not have one) for your aftercare needs so that they can reassess your need for medications and monitor your lab values.   The results of significant diagnostics from this hospitalization (including imaging, microbiology, ancillary and laboratory) are listed below for reference.      Procedures/Studies:  CT Abdomen Pelvis W Contrast  Result Date: 05/09/2021 CLINICAL DATA:  65 year old female with abdominal pain and fever. EXAM: CT ABDOMEN AND PELVIS WITH CONTRAST TECHNIQUE: Multidetector CT imaging of the abdomen and pelvis was performed using the standard protocol following bolus administration of intravenous contrast. CONTRAST:  148m OMNIPAQUE IOHEXOL 300 MG/ML  SOLN COMPARISON:  CT abdomen pelvis dated 12/16/2020. FINDINGS: Lower chest: The visualized lung bases are clear. There is coronary vascular calcification. Small calcified granuloma in the lingula. No intra-abdominal free air or free fluid. Hepatobiliary: Probable fatty liver. There is mild intrahepatic biliary dilatation. There is a cluster of sludge or small stones in the gallbladder. The gallbladder is distended. Probable small pericholecystic fluid. Ultrasound may provide better evaluation if there is high clinical concern for acute cholecystitis. Pancreas: Unremarkable. No pancreatic ductal dilatation or surrounding inflammatory changes. Spleen: Small scattered calcified splenic granuloma. Adrenals/Urinary Tract: The adrenal  glands are unremarkable. Mild left renal parenchyma atrophy. There is a 2 cm cyst from the posterior interpolar right kidney. There is no hydronephrosis on either side. There is symmetric enhancement and excretion of contrast by both kidneys. The visualized ureters and urinary bladder appear unremarkable. Stomach/Bowel: Postsurgical changes of gastric bypass. There is no bowel obstruction or active inflammation. The appendix is not visualized with certainty. No inflammatory changes identified in the right lower quadrant. Vascular/Lymphatic: Mild aortoiliac atherosclerotic disease. The IVC is unremarkable. No portal venous gas. There is no adenopathy. Reproductive: Hysterectomy. No adnexal masses. Other: Anterior pelvic wall C-section scar. Musculoskeletal: Osteopenia with degenerative changes of the spine. There is acute appearance compression fracture of superior endplate of L4 with approximately 20% loss of vertebral body height. Correlation with clinical exam and point tenderness recommended. No retropulsion. IMPRESSION: 1. Acute compression fracture of superior endplate of L4. No retropulsion. 2. Gallstone/sludge with probable small pericholecystic fluid. Ultrasound may provide better evaluation if there is high clinical concern for acute cholecystitis. 3. Aortic Atherosclerosis (ICD10-I70.0). Electronically Signed   By: AAnner CreteM.D.   On: 05/09/2021 22:53   MR 3D Recon At Scanner  Result Date: 05/11/2021 CLINICAL DATA:  Elevated liver function test. Gallstones/sludge demonstrated on ultrasound. Mild gallbladder wall thickening. EXAM: MRI ABDOMEN WITHOUT AND WITH CONTRAST (INCLUDING MRCP) TECHNIQUE: Multiplanar multisequence MR imaging of the abdomen was performed both before and after the administration of intravenous contrast. Heavily T2-weighted images of the biliary and pancreatic ducts were obtained, and three-dimensional MRCP images were rendered by post processing. CONTRAST:  7.560mGADAVIST  GADOBUTROL 1 MMOL/ML IV SOLN COMPARISON:  Ultrasound 05/10/2021, CT 05/09/2021 FINDINGS: Lower chest:  Lung bases are clear. Hepatobiliary: Normal liver parenchyma. No intrahepatic or extrahepatic biliary duct dilatation. The common hepatic duct in the common bile duct normal caliber. Small amount of sludge layers within the lumen of the gallbladder (image 25/series 3. No evidence of gallbladder inflammation. No wall thickening. Gallbladder is mildly distended at 4.2 cm. Pancreas: Normal pancreatic parenchymal intensity. No ductal dilatation or inflammation. Spleen: Normal spleen. Adrenals/urinary tract: Adrenal glands normal. Nonenhancing cyst of the RIGHT kidney noted. Stomach/Bowel: Stomach  and limited of the small bowel is unremarkable Vascular/Lymphatic: Abdominal aortic normal caliber. No retroperitoneal periportal lymphadenopathy. Musculoskeletal: No aggressive osseous lesion IMPRESSION: 1. Gallbladder sludge without evidence acute cholecystitis. 2. No choledocholithiasis or biliary obstruction. 3. Normal liver parenchyma.  Normal pancreas Electronically Signed   By: Suzy Bouchard M.D.   On: 05/11/2021 08:14   US Abdomen Limited  Result Date: 05/10/2021 CLINICAL DATA:  Fever.  Elevated LFTs, abnormal CT EXAM: ULTRASOUND ABDOMEN LIMITED RIGHT UPPER QUADRANT COMPARISON:  CT 05/09/2021 FINDINGS: Gallbladder: Gallbladder is distended. Multiple mobile stones and sludge within the gallbladder. Gallbladder wall is mildly thickened at 4 mm. Trace pericholecystic fluid. Negative sonographic Murphy's. Common bile duct: Diameter: Mildly dilated, 10 mm. The distal duct is poorly visualized due to overlying bowel gas. Liver: No focal lesion identified. Within normal limits in parenchymal echogenicity. Portal vein is patent on color Doppler imaging with normal direction of blood flow towards the liver. Other: None. IMPRESSION: Cholelithiasis. Gallbladder distension. Sludge present as well as mild wall thickening and  trace pericholecystic fluid. Recommend clinical correlation to exclude acute cholecystitis. Mildly dilated common bile duct, 10 mm. Distal duct cannot be visualized due to overlying bowel gas. Electronically Signed   By: Rolm Baptise M.D.   On: 05/10/2021 01:16   DG Chest Port 1 View  Result Date: 05/09/2021 CLINICAL DATA:  Chest pain, dyspnea EXAM: PORTABLE CHEST 1 VIEW COMPARISON:  12/16/2020 FINDINGS: Lungs are clear. No pneumothorax or pleural effusion. Cardiac size within normal limits. Pulmonary vascularity is normal. Numerous surgical clips are again seen at the thoracic inlet likely related to prior bright red ectomy. Remote right seventh rib healed fracture noted. No acute bone abnormality. IMPRESSION: No active disease. Electronically Signed   By: Fidela Salisbury MD   On: 05/09/2021 23:06   DG C-Arm 1-60 Min-No Report  Result Date: 05/11/2021 Fluoroscopy was utilized by the requesting physician.  No radiographic interpretation.   MR ABDOMEN MRCP W WO CONTAST  Result Date: 05/11/2021 CLINICAL DATA:  Elevated liver function test. Gallstones/sludge demonstrated on ultrasound. Mild gallbladder wall thickening. EXAM: MRI ABDOMEN WITHOUT AND WITH CONTRAST (INCLUDING MRCP) TECHNIQUE: Multiplanar multisequence MR imaging of the abdomen was performed both before and after the administration of intravenous contrast. Heavily T2-weighted images of the biliary and pancreatic ducts were obtained, and three-dimensional MRCP images were rendered by post processing. CONTRAST:  7.12m GADAVIST GADOBUTROL 1 MMOL/ML IV SOLN COMPARISON:  Ultrasound 05/10/2021, CT 05/09/2021 FINDINGS: Lower chest:  Lung bases are clear. Hepatobiliary: Normal liver parenchyma. No intrahepatic or extrahepatic biliary duct dilatation. The common hepatic duct in the common bile duct normal caliber. Small amount of sludge layers within the lumen of the gallbladder (image 25/series 3. No evidence of gallbladder inflammation. No wall  thickening. Gallbladder is mildly distended at 4.2 cm. Pancreas: Normal pancreatic parenchymal intensity. No ductal dilatation or inflammation. Spleen: Normal spleen. Adrenals/urinary tract: Adrenal glands normal. Nonenhancing cyst of the RIGHT kidney noted. Stomach/Bowel: Stomach and limited of the small bowel is unremarkable Vascular/Lymphatic: Abdominal aortic normal caliber. No retroperitoneal periportal lymphadenopathy. Musculoskeletal: No aggressive osseous lesion IMPRESSION: 1. Gallbladder sludge without evidence acute cholecystitis. 2. No choledocholithiasis or biliary obstruction. 3. Normal liver parenchyma.  Normal pancreas Electronically Signed   By: SSuzy BouchardM.D.   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  XR Lumbar Spine 2-3 Views  Result Date: 04/18/2021 L4 superior endplate changes questionable compression fracture versus Schmorl's nodes.  LAB RESULTS: Basic Metabolic Panel: Recent Labs  Lab 05/12/21 0509 05/13/21 0645  NA 138 139  K 4.5 3.6  CL 106 107  CO2 19* 25  GLUCOSE 142* 108*  BUN 14 10  CREATININE 1.02* 0.87  CALCIUM 8.4* 8.3*   Liver Function Tests: Recent Labs  Lab 05/12/21 0509 05/13/21 0645  AST 111* 54*  ALT 501* 312*  ALKPHOS 179* 146*  BILITOT 1.3* 0.9  PROT 5.5* 5.3*  ALBUMIN 3.1* 2.8*   Recent Labs  Lab 05/09/21 1933  LIPASE 31   No results for input(s): AMMONIA in the last 168 hours. CBC: Recent Labs  Lab 05/09/21 1933 05/11/21 0545 05/11/21 1947 05/12/21 0509  WBC 2.6*  2.7*   < > 3.8* 5.8  NEUTROABS 1.9  --   --   --   HGB 12.9  12.9   < > 12.8 11.5*  HCT 40.7  40.4   < > 40.6 35.9*  MCV 100.0  99.3   < > 103.0* 100.0  PLT 140*  134*   < > 111* 136*   < > = values in this interval not displayed.   Cardiac Enzymes: Recent Labs  Lab 05/10/21 1345  CKTOTAL 21*   BNP: Invalid input(s): POCBNP CBG: Recent Labs  Lab  05/12/21 2203 05/13/21 0731  GLUCAP 232* 108*       Disposition and Follow-up: Discharge Instructions     Diet Carb Modified   Complete by: As directed    If the dressing is still on your incision site when you go home, remove it on the third day after your surgery date. Remove dressing if it begins to fall off, or if it is dirty or damaged before the third day.   Complete by: As directed    Increase activity slowly   Complete by: As directed         DISPOSITION: Home   DISCHARGE FOLLOW-UP  Follow-up Information     Sur[facility], [facility] Follow up.   Specialty: General Surgery Why: Please call to confirm your appointment time. We are working hard to make this appointment for you. Please bring a copy of your photo ID and insurance card. Please arrive 30 minutes prior to your appointment for paperwork. Contact information: [address] [place] [address] [phone]         Vivi Barrack, MD. Schedule an appointment as soon as possible for a visit in 2 week(s).   Specialty: Family Medicine Contact information: [place] [address] [phone]         Elouise Munroe, MD .   Specialties: Cardiology, Radiology Contact information: [address] [place] [address] [phone]                  Time coordinating discharge:  35 minutes  Signed:   Estill Cotta M.D. [facility] 05/13/2021, 12:19 PM

## 2021-05-13 NOTE — Progress Notes (Signed)
2 Days Post-Op  Subjective: CC: Port site pain is improving much. Tolerating diet without increased abdominal pain, or associated n/v. Mobilizing in halls. Voiding. She is comfortable with discharge today  Objective: Vital signs in last 24 hours: Temp:  [98 F (36.7 C)-98.8 F (37.1 C)] 98.1 F (36.7 C) (07/02 0452) Pulse Rate:  [70-78] 70 (07/02 0452) Resp:  [16-18] 16 (07/02 0452) BP: (122-125)/(65-66) 125/66 (07/02 0452) SpO2:  [94 %-96 %] 96 % (07/02 0452) Last BM Date: 05/08/21  Intake/Output from previous day: 07/01 0701 - 07/02 0700 In: 1500.6 [P.O.:360; I.V.:900; IV Piggyback:240.6] Out: 2175 [Urine:2175] Intake/Output this shift: No intake/output data recorded.  PE: Gen:  Alert, NAD, pleasant Card:  Reg Pulm:  Rate and effort normal  Abd: Soft, mild distension, appropriately tender around laparoscopic incisions with some tenderness of the epigastrium and ecchymoses. No peritonitis. +BS Psych: A&Ox3  Skin: no rashes noted, warm and dry   Lab Results:  Recent Labs    05/11/21 1947 05/12/21 0509  WBC 3.8* 5.8  HGB 12.8 11.5*  HCT 40.6 35.9*  PLT 111* 136*   BMET Recent Labs    05/12/21 0509 05/13/21 0645  NA 138 139  K 4.5 3.6  CL 106 107  CO2 19* 25  GLUCOSE 142* 108*  BUN 14 10  CREATININE 1.02* 0.87  CALCIUM 8.4* 8.3*   PT/INR No results for input(s): LABPROT, INR in the last 72 hours. CMP     Component Value Date/Time   NA 139 05/13/2021 0645   NA 144 07/06/2019 1524   K 3.6 05/13/2021 0645   CL 107 05/13/2021 0645   CO2 25 05/13/2021 0645   GLUCOSE 108 (H) 05/13/2021 0645   BUN 10 05/13/2021 0645   BUN 22 07/06/2019 1524   CREATININE 0.87 05/13/2021 0645   CREATININE 0.94 10/11/2020 1156   CALCIUM 8.3 (L) 05/13/2021 0645   CALCIUM 8.9 01/30/2013 1017   PROT 5.3 (L) 05/13/2021 0645   ALBUMIN 2.8 (L) 05/13/2021 0645   AST 54 (H) 05/13/2021 0645   AST 25 02/29/2020 1303   ALT 312 (H) 05/13/2021 0645   ALT 37 02/29/2020 1303    ALKPHOS 146 (H) 05/13/2021 0645   BILITOT 0.9 05/13/2021 0645   BILITOT 0.4 02/29/2020 1303   GFRNONAA >60 05/13/2021 0645   GFRNONAA 63 08/16/2020 1207   GFRAA 73 08/16/2020 1207   Lipase     Component Value Date/Time   LIPASE 31 05/09/2021 1933       Studies/Results: DG C-Arm 1-60 Min-No Report  Result Date: 05/11/2021 Fluoroscopy was utilized by the requesting physician.  No radiographic interpretation.    Anti-infectives: Anti-infectives (From admission, onward)    Start     Dose/Rate Route Frequency Ordered Stop   05/13/21 0000  amoxicillin-clavulanate (AUGMENTIN) 875-125 MG tablet        1 tablet Oral 2 times daily 05/13/21 1009 05/18/21 2359   05/10/21 1400  piperacillin-tazobactam (ZOSYN) IVPB 3.375 g        3.375 g 12.5 mL/hr over 240 Minutes Intravenous Every 8 hours 05/10/21 1232     05/09/21 2215  piperacillin-tazobactam (ZOSYN) IVPB 3.375 g        3.375 g 100 mL/hr over 30 Minutes Intravenous  Once 05/09/21 2213 05/10/21 0244        Assessment/Plan S/p Laparoscopic Cholecystectomy with intraoperative cholangiogram for Acute cholecystitis with small CBD stones - Dr. Hassell Done - 05/11/21 - Per op note [place] showing delayed drainage of the CBD into  the duodenum.  Multiple small black stones consistent with small CBD stones--likely will pass. LFT's this am w/ downtrending Alk phos, AST, ALT and overall stable T. Bili at 1.3 from 1.2 - Pain much improved, tolerating diet, passing flatus - Mobilize, pulm toilet  VTE - SCDs, heparin subq ID - Zosyn  Stable from our standpoint for discharge home today   LOS: 3 days   Nadeen Landau, MD [facility] Use [email or web address] to contact on call provider

## 2021-05-13 NOTE — Evaluation (Signed)
Physical Therapy Evaluation Patient Details Name: Savannah [name] MRN: [id number] DOB: Apr 06, 1956 Today's Date: 05/13/2021   History of Present Illness  Patient is a 65 year old female with history of diabetes mellitus, neuropathy, esophagitis, GERD presented with abdominal pain.  Patient reported pain mostly in the epigastric region for last 3 days, sudden onset, severe, intermittent and associated with poor oral intake and changes in the bowel habits.   Patient had a fall about 3 weeks ago that resulted in lumbar spine compression fracture. Pt is now s/p Laparoscopic cholecystectomy (05/11/21).  Clinical Impression  Pt admitted with above diagnosis.  Pt currently with functional limitations due to the deficits listed below (see PT Problem List). Pt has had outpatient balance therapy at [facility] before and feel she would benefit from this again.  She is interested in this at time of d/c.  Pt is safe to d/c from PT standpoint and will have assistance from her boyfriend.  Will d/c from PT.  If she is not d/c and she needs further acute PT please re-order.     Follow Up Recommendations Outpatient PT    Equipment Recommendations  None recommended by PT    Recommendations for Other Services       Precautions / Restrictions Precautions Precautions: Fall Restrictions Weight Bearing Restrictions: No      Mobility  Bed Mobility               General bed mobility comments: sitting up in chair upon arrival.  Reviewed bed mobility with her.    Transfers Overall transfer level: Modified independent Equipment used: None             General transfer comment: stood without A  Ambulation/Gait Ambulation/Gait assistance: Min Gaffer (Feet): 250 Feet Assistive device: IV Pole;None Gait Pattern/deviations: Step-through pattern Gait velocity: decreased   General Gait Details: Initially ambulated with IV pole for first 30' of gait then without.  No signs of balance  loss.  Stairs            Wheelchair Mobility    Modified Rankin (Stroke Patients Only)       Balance Overall balance assessment: History of Falls                                           Pertinent Vitals/Pain Pain Assessment: 0-10 Pain Score: 4  Pain Location: belly and L side Pain Descriptors / Indicators: Sore;Operative site guarding Pain Intervention(s): Limited activity within patient's tolerance;Monitored during session;Repositioned    Home Living Family/patient expects to be discharged to:: Private residence Living Arrangements: Alone (boyfriend there half the week) Available Help at Discharge: Family;Available PRN/intermittently Type of Home: House Home Access: Stairs to enter;Level entry Entrance Stairs-Rails: Right;Left Entrance Stairs-Number of Steps: 8 at the front none at the back Home Layout: One level Home Equipment: Cane - single point;Other (comment);Shower seat - built in Additional Comments: walking sticks    Prior Function Level of Independence: Independent               Hand Dominance        Extremity/Trunk Assessment        Lower Extremity Assessment Lower Extremity Assessment: RLE deficits/detail;LLE deficits/detail RLE Sensation: history of peripheral neuropathy LLE Sensation: history of peripheral neuropathy       Communication   Communication: No difficulties  Cognition Arousal/Alertness: Awake/alert Behavior During Therapy: [facility]  for tasks assessed/performed Overall Cognitive Status: Within Functional Limits for tasks assessed                                        General Comments General comments (skin integrity, edema, etc.): Pt with incisions on abdomen with some irritation from steri-strips that pt said nurse is aware of.    Exercises     Assessment/Plan    PT Assessment All further PT needs can be met in the next venue of care  PT Problem List Decreased balance;Impaired  sensation       PT Treatment Interventions      PT Goals (Current goals can be found in the Care Plan section)  Acute Rehab PT Goals Patient Stated Goal: go home PT Goal Formulation: All assessment and education complete, DC therapy    Frequency     Barriers to discharge        Co-evaluation               AM-PAC PT "6 Clicks" Mobility  Outcome Measure Help needed turning from your back to your side while in a flat bed without using bedrails?: None Help needed moving from lying on your back to sitting on the side of a flat bed without using bedrails?: None Help needed moving to and from a bed to a chair (including a wheelchair)?: None Help needed standing up from a chair using your arms (e.g., wheelchair or bedside chair)?: None Help needed to walk in hospital room?: None Help needed climbing 3-5 steps with a railing? : A Little 6 Click Score: 23    End of Session   Activity Tolerance: Patient tolerated treatment well (slight increase in pain with gait)   Nurse Communication: Mobility status PT Visit Diagnosis: History of falling (Z91.81);Difficulty in walking, not elsewhere classified (R26.2)    Time: 8295-6213 PT Time Calculation (min) (ACUTE ONLY): 18 min   Charges:   PT Evaluation $PT Eval Low Complexity: 1 Low          Savannah [name], [name]  05/13/2021   Savannah [name] 05/13/2021, 10:14 AM

## 2021-05-15 LAB — CULTURE, BLOOD (ROUTINE X 2)
Culture: NO GROWTH
Culture: NO GROWTH
Special Requests: ADEQUATE
Special Requests: ADEQUATE

## 2021-05-16 ENCOUNTER — Telehealth: Payer: Self-pay

## 2021-05-16 LAB — HEMOGLOBIN A1C
Hgb A1c MFr Bld: 7 % — ABNORMAL HIGH (ref 4.8–5.6)
Mean Plasma Glucose: 154 mg/dL

## 2021-05-16 NOTE — Telephone Encounter (Signed)
Transition Care Management Follow-up Telephone Call Date of discharge and from where: [facility] 05/13/21 How have you been since you were released from the hospital? ok Any questions or concerns? Yes want to go over reasons for medications that where Discontinued   Items Reviewed: Did the pt receive and understand the discharge instructions provided? Yes  Medications obtained and verified? Yes  Other? No  Any new allergies since your discharge? No  Dietary orders reviewed? Yes Do you have support at home? Yes   Home Care and Equipment/Supplies: Were home health services ordered? not applicable If so, what is the name of the agency?   Has the agency set up a time to come to the patient's home? not applicable Were any new equipment or medical supplies ordered?  No What is the name of the medical supply agency?  Were you able to get the supplies/equipment? not applicable Do you have any questions related to the use of the equipment or supplies? No  Functional Questionnaire: (I = Independent and D = Dependent) ADLs: I  Bathing/Dressing- I  Meal Prep- I  Eating- I  Maintaining continence- I  Transferring/Ambulation- I  Managing Meds- I  Follow up appointments reviewed:  PCP Hospital f/u appt confirmed? Yes  Scheduled to see Dr Jerline Pain  on 05/18/21 @ 2:40 . [facility] f/u appt confirmed? Yes  Scheduled to see Dr Loanne Drilling on 05/24/21 @ 1:00. Are transportation arrangements needed? No  If their condition worsens, is the pt aware to call PCP or go to the Emergency Dept.? Yes Was the patient provided with contact information for the PCP's office or ED? Yes Was to pt encouraged to call back with questions or concerns? Yes

## 2021-05-17 LAB — SURGICAL PATHOLOGY

## 2021-05-18 ENCOUNTER — Other Ambulatory Visit: Payer: Self-pay

## 2021-05-18 ENCOUNTER — Ambulatory Visit (INDEPENDENT_AMBULATORY_CARE_PROVIDER_SITE_OTHER): Payer: Medicare Other | Admitting: Family Medicine

## 2021-05-18 ENCOUNTER — Encounter: Payer: Self-pay | Admitting: Family Medicine

## 2021-05-18 VITALS — BP 108/72 | HR 81 | Temp 98.0°F | Ht 71.0 in | Wt 161.4 lb

## 2021-05-18 DIAGNOSIS — E785 Hyperlipidemia, unspecified: Secondary | ICD-10-CM | POA: Diagnosis not present

## 2021-05-18 DIAGNOSIS — E1169 Type 2 diabetes mellitus with other specified complication: Secondary | ICD-10-CM | POA: Diagnosis not present

## 2021-05-18 DIAGNOSIS — R7989 Other specified abnormal findings of blood chemistry: Secondary | ICD-10-CM

## 2021-05-18 DIAGNOSIS — K8081 Other cholelithiasis with obstruction: Secondary | ICD-10-CM | POA: Diagnosis not present

## 2021-05-18 NOTE — Progress Notes (Signed)
Office Visit Note  Patient: Savannah [name]             Date of Birth: 1956/04/17           MRN: [id number]             PCP: Vivi Barrack, MD Referring: Vivi Barrack, MD Visit Date: 05/19/2021 Occupation: @GUAROCC @  Subjective:  Pain in right wrist and hand   History of Present Illness: Savannah [name] is a 65 y.o. female history of CPPD and osteoarthritis.  She states she fell 10 weeks ago and landed up on her bilateral wrist.  After that she was at a party where she had to chop a lot of vegetables and she open cans with a can opener.  She started having increased pain and discomfort in her bilateral wrist.  She was evaluated by Dr. Erlinda Hong who did x-rays and diagnosed her with carpal tunnel syndrome.  He gave her cortisone injection for the bilateral carpal tunnel syndrome.  She states she noticed improvement in her left wrist but her right wrist continue to hurt.  Over the last 1-1/2-day the pain has been severe.  She states she has been taking tramadol without much relief.  She has been soaking in Epsom salt.  None of the other joints are painful.  She had cholecystectomy about a week ago.  She is uncertain if she received colchicine in the hospital.  Activities of Daily Living:  Patient reports morning stiffness for 0 minutes.   Patient Reports nocturnal pain.  Difficulty dressing/grooming: Reports Difficulty climbing stairs: Reports Difficulty getting out of chair: Reports Difficulty using hands for taps, buttons, cutlery, and/or writing: Reports  Review of Systems  Constitutional:  Positive for fatigue.  HENT:  Positive for mouth dryness. Negative for mouth sores and nose dryness.   Eyes:  Positive for dryness. Negative for pain, itching and visual disturbance.  Respiratory:  Negative for cough, hemoptysis, shortness of breath and difficulty breathing.   Cardiovascular:  Positive for chest pain, irregular heartbeat and swelling in legs/feet. Negative for palpitations.   Gastrointestinal:  Positive for abdominal pain. Negative for blood in stool, constipation and diarrhea.  Endocrine: Negative for increased urination.  Genitourinary:  Negative for painful urination.  Musculoskeletal:  Positive for joint pain, joint pain, joint swelling and morning stiffness. Negative for myalgias, muscle weakness, muscle tenderness and myalgias.  Skin:  Negative for color change, rash and redness.  Allergic/Immunologic: Negative for susceptible to infections.  Neurological:  Negative for dizziness, numbness, headaches, memory loss and weakness.  Hematological:  Negative for swollen glands.  Psychiatric/Behavioral:  Positive for sleep disturbance. Negative for confusion.    PMFS History:  Patient Active Problem List   Diagnosis Date Noted   Osteopenia 01/14/2021   Sesamoiditis of left foot 11/09/2020   Posterior tibial tendinitis of left lower extremity 11/09/2020   Type 2 diabetes mellitus with diabetic peripheral angiopathy without gangrene, with long-term current use of insulin ([place]) 10/21/2020   Iron deficiency anemia 03/14/2020   Deficiency anemia 02/29/2020   Lesion of vulva 09/02/2019   Menorrhagia 09/02/2019   Neuropathy 09/02/2019   Irregular bowel habits 06/25/2019   Coronary artery disease involving native coronary artery of native heart without angina pectoris    Temporomandibular joint disorder 12/05/2018   Fatigue 12/05/2018   Inverted nipple 12/05/2018   Senile purpura ([place]) 11/21/2018   GERD with esophagitis 11/21/2018   Pseudogout involving multiple joints 11/21/2018   Lipoma 46/27/0350   Lichen sclerosus  11/16/2016   H/O gastric bypass 11/16/2016   GERD (gastroesophageal reflux disease) 11/16/2016   Depression 11/16/2016   Diabetic polyneuropathy associated with type 2 diabetes mellitus ([place]) 11/16/2016   Uterine cancer ([place]) s/p hysterectomy 1997 01/04/2012   Postsurgical hypothyroidism 09/19/2010   Diabetic neuropathy ([place]) 11/21/2007    Allergic rhinitis 11/21/2007   Dyslipidemia associated with type 2 diabetes mellitus ([place]) 07/08/2007    Past Medical History:  Diagnosis Date   ABDOMINAL PAIN, CHRONIC 10/20/2009   Acute cystitis 08/17/2009   ALLERGIC RHINITIS 11/21/2007   Allergy    ASYMPTOMATIC POSTMENOPAUSAL STATUS 09/29/2008   Bariatric surgery status 07/07/2010   Bariatric surgery status 07/07/2010   Qualifier: Diagnosis of  By: Loanne Drilling MD, Sean A    Cancer [facility])    uterine   Cataract    Coronary artery disease    DEPRESSION 11/21/2007   DIABETES MELLITUS, TYPE II 07/08/2007   DYSPNEA 05/20/2009   Edema 56/38/7564   Eosinophilic esophagitis 33/29/5188   FEVER UNSPECIFIED 10/20/2009   FEVER, HX OF 03/09/2010   GERD (gastroesophageal reflux disease)    Headache(784.0) 02/06/2010   Hepatomegaly 01/06/2008   HYPERLIPIDEMIA 07/08/2007   HYPERTENSION 07/08/2007   LEUKOPENIA, MILD 09/29/2008   OBESITY 01/06/2008   OSTEOARTHRITIS 01/06/2008   Other chronic nonalcoholic liver disease 41/66/0630   PERIPHERAL NEUROPATHY 11/21/2007   Peripheral neuropathy    Postsurgical hypothyroidism 09/19/2010   SINUSITIS- ACUTE-NOS 11/21/2007   TB SKIN TEST, POSITIVE 02/06/2010   THYROID NODULE 03/09/2010    Family History  Problem Relation Age of Onset   Multiple sclerosis Sister    Breast cancer Sister        breast onset age 63   Osteoporosis Sister    Diabetes Mother    Diabetes Father    Congestive Heart Failure Father    Hypertension Father    Myelodysplastic syndrome Father    Liver cancer Maternal Aunt    Breast cancer Maternal Aunt    Diabetes Maternal Aunt    Colon cancer Maternal Uncle    Diabetes Maternal Uncle    Diabetes Maternal Grandmother    Diabetes Paternal Grandmother    Past Surgical History:  Procedure Laterality Date   ABDOMINAL HYSTERECTOMY     BASAL CELL CARCINOMA EXCISION     CARDIAC CATHETERIZATION     CHOLECYSTECTOMY N/A 05/11/2021   Procedure: LAPAROSCOPIC CHOLECYSTECTOMY WITH  POSSIBLE  INTRAOPERATIVE CHOLANGIOGRAM;  Surgeon: Johnathan Hausen, MD;  Location: [facility];  Service: General;  Laterality: N/A;   COLONOSCOPY     EYE SURGERY Bilateral 08/2018, 09/2018   FOOT SURGERY Left    10/2019   KNEE ARTHROSCOPY Left    LEFT HEART CATH AND CORONARY ANGIOGRAPHY N/A 01/20/2019   Procedure: LEFT HEART CATH AND CORONARY ANGIOGRAPHY;  Surgeon: Belva Crome, MD;  Location: [facility];  Service: Cardiovascular;  Laterality: N/A;   LYMPH NODE DISSECTION     OOPHORECTOMY     Right total knee replacement     ROUX-EN-Y GASTRIC BYPASS  2011   SKIN TAG REMOVAL     THYROIDECTOMY     TONSILLECTOMY AND ADENOIDECTOMY     UPPER GASTROINTESTINAL ENDOSCOPY     Social History   Social History Narrative   Not on file   Immunization History  Administered Date(s) Administered   Fluad Quad(high Dose 65+) 07/13/2019   Influenza Inj Mdck Quad Pf 10/19/2017, 07/23/2018   Influenza Whole 07/30/2008, 08/17/2009, 09/19/2010   Influenza,inj,Quad PF,6-35 Mos 07/14/2015   Influenza-Unspecified 10/13/2011, 08/12/2013, 07/13/2017,  07/23/2018   PFIZER(Purple Top)SARS-COV-2 Vaccination 12/21/2019, 01/14/2020, 08/18/2020   Pneumococcal Conjugate-13 10/14/2015   Pneumococcal Polysaccharide-23 06/25/2014, 10/21/2020   Tdap 05/20/2020   Zoster Recombinat (Shingrix) 05/20/2020   Zoster, Live 06/25/2014     Objective: Vital Signs: BP 116/81 (BP Location: Left Arm, Patient Position: Sitting, Cuff Size: Normal)   Pulse 77   Ht 5' 11"  (1.803 m)   Wt 161 lb (73 kg) Comment: Patient refused, weight was stated  BMI 22.45 kg/m    Physical Exam Vitals and nursing note reviewed.  Constitutional:      Appearance: She is well-developed.  HENT:     Head: Normocephalic and atraumatic.  Eyes:     Conjunctiva/sclera: Conjunctivae normal.  Cardiovascular:     Rate and Rhythm: Normal rate and regular rhythm.     Heart sounds: Normal heart sounds.  Pulmonary:     Effort: Pulmonary effort is  normal.     Breath sounds: Normal breath sounds.  Abdominal:     General: Bowel sounds are normal.     Palpations: Abdomen is soft.  Musculoskeletal:     Cervical back: Normal range of motion.  Lymphadenopathy:     Cervical: No cervical adenopathy.  Skin:    General: Skin is warm and dry.     Capillary Refill: Capillary refill takes less than 2 seconds.  Neurological:     Mental Status: She is alert and oriented to person, place, and time.  Psychiatric:        Behavior: Behavior normal.     Musculoskeletal Exam: Good range of motion.  Shoulder joints and elbow joints in good range of motion.  She had swelling and tenosynovitis over the volar aspect of her right wrist.  There was no MCP PIP or DIP swelling.  PIP and DIP prominence consistent with osteoarthritis was noted.  Hip joints, knee joints, ankles, MTPs and PIPs with good range of motion with no synovitis.  CDAI Exam: CDAI Score: -- Patient Global: --; Provider Global: -- Swollen: --; Tender: -- Joint Exam 05/19/2021   No joint exam has been documented for this visit   There is currently no information documented on the homunculus. Go to the Rheumatology activity and complete the homunculus joint exam.  Investigation: No additional findings.  Imaging: CT Abdomen Pelvis W Contrast  Result Date: 05/09/2021 CLINICAL DATA:  66 year old female with abdominal pain and fever. EXAM: CT ABDOMEN AND PELVIS WITH CONTRAST TECHNIQUE: Multidetector CT imaging of the abdomen and pelvis was performed using the standard protocol following bolus administration of intravenous contrast. CONTRAST:  110m OMNIPAQUE IOHEXOL 300 MG/ML  SOLN COMPARISON:  CT abdomen pelvis dated 12/16/2020. FINDINGS: Lower chest: The visualized lung bases are clear. There is coronary vascular calcification. Small calcified granuloma in the lingula. No intra-abdominal free air or free fluid. Hepatobiliary: Probable fatty liver. There is mild intrahepatic biliary  dilatation. There is a cluster of sludge or small stones in the gallbladder. The gallbladder is distended. Probable small pericholecystic fluid. Ultrasound may provide better evaluation if there is high clinical concern for acute cholecystitis. Pancreas: Unremarkable. No pancreatic ductal dilatation or surrounding inflammatory changes. Spleen: Small scattered calcified splenic granuloma. Adrenals/Urinary Tract: The adrenal glands are unremarkable. Mild left renal parenchyma atrophy. There is a 2 cm cyst from the posterior interpolar right kidney. There is no hydronephrosis on either side. There is symmetric enhancement and excretion of contrast by both kidneys. The visualized ureters and urinary bladder appear unremarkable. Stomach/Bowel: Postsurgical changes of gastric bypass. There is  no bowel obstruction or active inflammation. The appendix is not visualized with certainty. No inflammatory changes identified in the right lower quadrant. Vascular/Lymphatic: Mild aortoiliac atherosclerotic disease. The IVC is unremarkable. No portal venous gas. There is no adenopathy. Reproductive: Hysterectomy. No adnexal masses. Other: Anterior pelvic wall C-section scar. Musculoskeletal: Osteopenia with degenerative changes of the spine. There is acute appearance compression fracture of superior endplate of L4 with approximately 20% loss of vertebral body height. Correlation with clinical exam and point tenderness recommended. No retropulsion. IMPRESSION: 1. Acute compression fracture of superior endplate of L4. No retropulsion. 2. Gallstone/sludge with probable small pericholecystic fluid. Ultrasound may provide better evaluation if there is high clinical concern for acute cholecystitis. 3. Aortic Atherosclerosis (ICD10-I70.0). Electronically Signed   By: Anner Crete M.D.   On: 05/09/2021 22:53   MR 3D Recon At Scanner  Result Date: 05/11/2021 CLINICAL DATA:  Elevated liver function test. Gallstones/sludge demonstrated  on ultrasound. Mild gallbladder wall thickening. EXAM: MRI ABDOMEN WITHOUT AND WITH CONTRAST (INCLUDING MRCP) TECHNIQUE: Multiplanar multisequence MR imaging of the abdomen was performed both before and after the administration of intravenous contrast. Heavily T2-weighted images of the biliary and pancreatic ducts were obtained, and three-dimensional MRCP images were rendered by post processing. CONTRAST:  7.50m GADAVIST GADOBUTROL 1 MMOL/ML IV SOLN COMPARISON:  Ultrasound 05/10/2021, CT 05/09/2021 FINDINGS: Lower chest:  Lung bases are clear. Hepatobiliary: Normal liver parenchyma. No intrahepatic or extrahepatic biliary duct dilatation. The common hepatic duct in the common bile duct normal caliber. Small amount of sludge layers within the lumen of the gallbladder (image 25/series 3. No evidence of gallbladder inflammation. No wall thickening. Gallbladder is mildly distended at 4.2 cm. Pancreas: Normal pancreatic parenchymal intensity. No ductal dilatation or inflammation. Spleen: Normal spleen. Adrenals/urinary tract: Adrenal glands normal. Nonenhancing cyst of the RIGHT kidney noted. Stomach/Bowel: Stomach and limited of the small bowel is unremarkable Vascular/Lymphatic: Abdominal aortic normal caliber. No retroperitoneal periportal lymphadenopathy. Musculoskeletal: No aggressive osseous lesion IMPRESSION: 1. Gallbladder sludge without evidence acute cholecystitis. 2. No choledocholithiasis or biliary obstruction. 3. Normal liver parenchyma.  Normal pancreas Electronically Signed   By: SSuzy BouchardM.D.   On: 05/11/2021 08:14   UKoreaAbdomen Limited  Result Date: 05/10/2021 CLINICAL DATA:  Fever.  Elevated LFTs, abnormal CT EXAM: ULTRASOUND ABDOMEN LIMITED RIGHT UPPER QUADRANT COMPARISON:  CT 05/09/2021 FINDINGS: Gallbladder: Gallbladder is distended. Multiple mobile stones and sludge within the gallbladder. Gallbladder wall is mildly thickened at 4 mm. Trace pericholecystic fluid. Negative sonographic  Murphy's. Common bile duct: Diameter: Mildly dilated, 10 mm. The distal duct is poorly visualized due to overlying bowel gas. Liver: No focal lesion identified. Within normal limits in parenchymal echogenicity. Portal vein is patent on color Doppler imaging with normal direction of blood flow towards the liver. Other: None. IMPRESSION: Cholelithiasis. Gallbladder distension. Sludge present as well as mild wall thickening and trace pericholecystic fluid. Recommend clinical correlation to exclude acute cholecystitis. Mildly dilated common bile duct, 10 mm. Distal duct cannot be visualized due to overlying bowel gas. Electronically Signed   By: KRolm BaptiseM.D.   On: 05/10/2021 01:16   DG Chest Port 1 View  Result Date: 05/09/2021 CLINICAL DATA:  Chest pain, dyspnea EXAM: PORTABLE CHEST 1 VIEW COMPARISON:  12/16/2020 FINDINGS: Lungs are clear. No pneumothorax or pleural effusion. Cardiac size within normal limits. Pulmonary vascularity is normal. Numerous surgical clips are again seen at the thoracic inlet likely related to prior bright red ectomy. Remote right seventh rib healed fracture noted. No acute  bone abnormality. IMPRESSION: No active disease. Electronically Signed   By: Fidela Salisbury MD   On: 05/09/2021 23:06   DG C-Arm 1-60 Min-No Report  Result Date: 05/11/2021 Fluoroscopy was utilized by the requesting physician.  No radiographic interpretation.   MR ABDOMEN MRCP W WO CONTAST  Result Date: 05/11/2021 CLINICAL DATA:  Elevated liver function test. Gallstones/sludge demonstrated on ultrasound. Mild gallbladder wall thickening. EXAM: MRI ABDOMEN WITHOUT AND WITH CONTRAST (INCLUDING MRCP) TECHNIQUE: Multiplanar multisequence MR imaging of the abdomen was performed both before and after the administration of intravenous contrast. Heavily T2-weighted images of the biliary and pancreatic ducts were obtained, and three-dimensional MRCP images were rendered by post processing. CONTRAST:  7.63m GADAVIST  GADOBUTROL 1 MMOL/ML IV SOLN COMPARISON:  Ultrasound 05/10/2021, CT 05/09/2021 FINDINGS: Lower chest:  Lung bases are clear. Hepatobiliary: Normal liver parenchyma. No intrahepatic or extrahepatic biliary duct dilatation. The common hepatic duct in the common bile duct normal caliber. Small amount of sludge layers within the lumen of the gallbladder (image 25/series 3. No evidence of gallbladder inflammation. No wall thickening. Gallbladder is mildly distended at 4.2 cm. Pancreas: Normal pancreatic parenchymal intensity. No ductal dilatation or inflammation. Spleen: Normal spleen. Adrenals/urinary tract: Adrenal glands normal. Nonenhancing cyst of the RIGHT kidney noted. Stomach/Bowel: Stomach and limited of the small bowel is unremarkable Vascular/Lymphatic: Abdominal aortic normal caliber. No retroperitoneal periportal lymphadenopathy. Musculoskeletal: No aggressive osseous lesion IMPRESSION: 1. Gallbladder sludge without evidence acute cholecystitis. 2. No choledocholithiasis or biliary obstruction. 3. Normal liver parenchyma.  Normal pancreas Electronically Signed   By: SSuzy BouchardM.D.   On: 05/11/2021 08:14    Recent Labs: Lab Results  Component Value Date   WBC 5.8 05/12/2021   HGB 11.5 (L) 05/12/2021   PLT 136 (L) 05/12/2021   NA 141 05/18/2021   K 4.6 05/18/2021   CL 105 05/18/2021   CO2 28 05/18/2021   GLUCOSE 68 (L) 05/18/2021   BUN 11 05/18/2021   CREATININE 0.88 05/18/2021   BILITOT 0.5 05/18/2021   ALKPHOS 154 (H) 05/18/2021   AST 28 05/18/2021   ALT 87 (H) 05/18/2021   PROT 6.3 05/18/2021   ALBUMIN 3.6 05/18/2021   CALCIUM 9.0 05/18/2021   GFRAA 73 08/16/2020    Speciality Comments: No specialty comments available.  Procedures:  No procedures performed Allergies: Ace inhibitors, Caffeine, Ciprofloxacin, Morphine and related, Mucinex [guaifenesin er], Nsaids, Other, Silicone, and Tape   Assessment / Plan:     Visit Diagnoses: Inflammatory arthritis - RF-, anti-CCP-,  14-3-3 eta-, ANA-, HLAB27-, uric acid 3.8. Responsive to colchicine-likely she has pseudogout.  She states she had a fall 10 days ago and after that she developed pain and discomfort in her bilateral wrist joints.  She had cortisone injection for carpal tunnel syndrome by Dr. XErlinda Hong  She had improvement in her left wrist but her right wrist continues to be painful.  She states she was at a party where she open cans and bottles and after that the pain got worse.  She was also in the hospital recently for cholecystectomy and she believes that she did not receive colchicine there.  She has been having severe pain and swelling in her right wrist joint for the last 1-1/2-day.  She has been taking colchicine 1 tablet daily.  She requested steroid injection intramuscularly.  She states she has had it in the past which is very effective.  As she is diabetic I would give her Depo-Medrol 40 mg intramuscularly and placed on prednisone 20  mg p.o. daily to be tapered by 5 mg every 2 days.  I advised her to monitor her blood glucose level closely.  If her glucose levels go up she may have to go to urgent care.  Primary osteoarthritis of both hands-she has bilateral PIP and DIP thickening but no synovitis.  Trochanteric bursitis of both hips-she continues to have some discomfort.  Status post total right knee replacement - Performed by Dr. Veverly Fells.   Primary osteoarthritis of left knee-doing well.  Primary osteoarthritis of both feet-she denies discomfort.  Age-related osteoporosis without current pathological fracture - DEXA updated on 01/11/21: Right femoral neck- T-score -2.5 and a -25/9% change in BMD from previous DEXA.  She is currently not on any medications.  I detailed discussion regarding starting on treatment.  We will schedule appointment in a month to discuss treatment options as she was in a lot of discomfort today.  Neck pain - She was previously having injections by her neurologist in  [place]  Chronic midline low back pain without sciatica - She requested a referral to neurology.  Declined referral for pain management.   Status post gastric bypass for obesity  History of gastroesophageal reflux (GERD)  Status post cholecystectomy  Eosinophilic esophagitis  History of diabetes mellitus, type II-patient was advised to monitor blood glucose levels closely.  NASH (nonalcoholic steatohepatitis)  History of peripheral neuropathy - gabapentin 300 mg 4 capsules by mouth daily for management of neuropathy.   Positive PPD  History of uterine cancer  Postsurgical hypothyroidism  History of hyperlipidemia  Orders: No orders of the defined types were placed in this encounter.  Meds ordered this encounter  Medications   predniSONE (DELTASONE) 5 MG tablet    Sig: Take 4 tabs po qd x 2 days, 3  tabs po qd x 2 days, 2  tabs po qd x 2 days, 1  tab po qd x 2 days    Dispense:  20 tablet    Refill:  0      Follow-Up Instructions: Return in about 1 month (around 06/19/2021) for CPPD, OA.   Bo Merino, MD  Note - This record has been created using Editor, commissioning.  Chart creation errors have been sought, but may not always  have been located. Such creation errors do not reflect on  the standard of medical care.

## 2021-05-18 NOTE — Assessment & Plan Note (Signed)
Holding statin while we wait for results of LFTs.  Will likely be able to restart once her levels are back to normal.

## 2021-05-18 NOTE — Progress Notes (Signed)
Chief Complaint:  Savannah [name] is a 65 y.o. female who presents today for a TCM visit.  Assessment/Plan:  New/Acute Problems: Symptomatic cholelithiasis s/p cholecystectomy Doing well postoperatively.  No red flags.  Reassuring abdominal exam today.  Follow-up as needed.    Elevated LFTs Secondary to gallstone hepatitis.  This was improving at the time of discharge.  We will recheck labs today.  Chronic Problems Addressed Today: Dyslipidemia associated with type 2 diabetes mellitus (HCC) Holding statin while we wait for results of LFTs.  Will likely be able to restart once her levels are back to normal.    Subjective:  HPI:  Summary of Hospital admission: Reason for admission: Symptomatic Cholelithiasis Date of admission: 05/09/2021 Date of discharge: 05/13/2021 Date of Interactive contact: 05/16/2021 Summary of Hospital course: Patient presented to the ED with right upper quadrant pain.  CT scan showed gallstones and sludge.  Ultrasound was obtained which showed cholelithiasis and mild wall thickening.  She underwent laparoscopic cholecystectomy.  She did well postoperatively and was discharged home.   Interim history:   She has done decently well since being discharged home.  Still has a bit of pain but seems to be improving.  She has been taking oxycodone as needed.  She has had some problem colored stools and also the constipation.  She has had to take MiraLAX.   ROS: Per HPI, otherwise a complete review of systems was negative.   PMH:  The following were reviewed and entered/updated in epic: Past Medical History:  Diagnosis Date   ABDOMINAL PAIN, CHRONIC 10/20/2009   Acute cystitis 08/17/2009   ALLERGIC RHINITIS 11/21/2007   Allergy    ASYMPTOMATIC POSTMENOPAUSAL STATUS 09/29/2008   Bariatric surgery status 07/07/2010   Bariatric surgery status 07/07/2010   Qualifier: Diagnosis of  By: Loanne Drilling MD, Sean A    Cancer [facility])    uterine   Cataract    Coronary artery  disease    DEPRESSION 11/21/2007   DIABETES MELLITUS, TYPE II 07/08/2007   DYSPNEA 05/20/2009   Edema 93/23/5573   Eosinophilic esophagitis 22/12/5425   FEVER UNSPECIFIED 10/20/2009   FEVER, HX OF 03/09/2010   GERD (gastroesophageal reflux disease)    Headache(784.0) 02/06/2010   Hepatomegaly 01/06/2008   HYPERLIPIDEMIA 07/08/2007   HYPERTENSION 07/08/2007   LEUKOPENIA, MILD 09/29/2008   OBESITY 01/06/2008   OSTEOARTHRITIS 01/06/2008   Other chronic nonalcoholic liver disease 05/05/7627   PERIPHERAL NEUROPATHY 11/21/2007   Peripheral neuropathy    Postsurgical hypothyroidism 09/19/2010   SINUSITIS- ACUTE-NOS 11/21/2007   TB SKIN TEST, POSITIVE 02/06/2010   THYROID NODULE 03/09/2010   Patient Active Problem List   Diagnosis Date Noted   Osteopenia 01/14/2021   Sesamoiditis of left foot 11/09/2020   Posterior tibial tendinitis of left lower extremity 11/09/2020   Type 2 diabetes mellitus with diabetic peripheral angiopathy without gangrene, with long-term current use of insulin ([place]) 10/21/2020   Iron deficiency anemia 03/14/2020   Deficiency anemia 02/29/2020   Lesion of vulva 09/02/2019   Menorrhagia 09/02/2019   Neuropathy 09/02/2019   Irregular bowel habits 06/25/2019   Coronary artery disease involving native coronary artery of native heart without angina pectoris    Temporomandibular joint disorder 12/05/2018   Fatigue 12/05/2018   Inverted nipple 12/05/2018   Senile purpura ([place]) 11/21/2018   GERD with esophagitis 11/21/2018   Pseudogout involving multiple joints 11/21/2018   Lipoma 31/51/7616   Lichen sclerosus 07/37/1062   H/O gastric bypass 11/16/2016   GERD (gastroesophageal reflux disease) 11/16/2016  Depression 11/16/2016   Diabetic polyneuropathy associated with type 2 diabetes mellitus ([place]) 11/16/2016   Uterine cancer [facility]) s/p hysterectomy 1997 01/04/2012   Postsurgical hypothyroidism 09/19/2010   Diabetic neuropathy ([place]) 11/21/2007   Allergic  rhinitis 11/21/2007   Dyslipidemia associated with type 2 diabetes mellitus ([place]) 07/08/2007   Past Surgical History:  Procedure Laterality Date   ABDOMINAL HYSTERECTOMY     BASAL CELL CARCINOMA EXCISION     CARDIAC CATHETERIZATION     CHOLECYSTECTOMY N/A 05/11/2021   Procedure: LAPAROSCOPIC CHOLECYSTECTOMY WITH POSSIBLE  INTRAOPERATIVE CHOLANGIOGRAM;  Surgeon: Johnathan Hausen, MD;  Location: [facility];  Service: General;  Laterality: N/A;   COLONOSCOPY     EYE SURGERY Bilateral 08/2018, 09/2018   FOOT SURGERY Left    10/2019   KNEE ARTHROSCOPY Left    LEFT HEART CATH AND CORONARY ANGIOGRAPHY N/A 01/20/2019   Procedure: LEFT HEART CATH AND CORONARY ANGIOGRAPHY;  Surgeon: Belva Crome, MD;  Location: [place] [facility];  Service: Cardiovascular;  Laterality: N/A;   LYMPH NODE DISSECTION     OOPHORECTOMY     Right total knee replacement     ROUX-EN-Y GASTRIC BYPASS  2011   SKIN TAG REMOVAL     THYROIDECTOMY     TONSILLECTOMY AND ADENOIDECTOMY     UPPER GASTROINTESTINAL ENDOSCOPY      Family History  Problem Relation Age of Onset   Multiple sclerosis Sister    Breast cancer Sister        breast onset age 72   Osteoporosis Sister    Diabetes Mother    Diabetes Father    Congestive Heart Failure Father    Hypertension Father    Myelodysplastic syndrome Father    Liver cancer Maternal Aunt    Breast cancer Maternal Aunt    Diabetes Maternal Aunt    Colon cancer Maternal Uncle    Diabetes Maternal Uncle    Diabetes Maternal Grandmother    Diabetes Paternal Grandmother     Medications- Reconciled discharge and current medications in Epic.  Current Outpatient Medications  Medication Sig Dispense Refill   acetaminophen (TYLENOL) 500 MG tablet Take 2 tablets (1,000 mg total) by mouth every 6 (six) hours as needed for mild pain or fever. 30 tablet 0   AMBULATORY NON FORMULARY MEDICATION Administrator at [employer]  Correct leg length and improve pronation BL 1 each 0    amoxicillin-clavulanate (AUGMENTIN) 875-125 MG tablet Take 1 tablet by mouth 2 (two) times daily for 5 days. 10 tablet 0   aspirin EC 81 MG tablet Take 1 tablet (81 mg total) by mouth daily. Starting 01/18/19 prior to cardiac cath     Cholecalciferol (VITAMIN D) 50 MCG (2000 UT) tablet Take 4,000 Units by mouth daily.     Colchicine 0.6 MG CAPS TAKE 1 CAPSULE BY MOUTH EVERY DAY 90 capsule 0   cyclobenzaprine (FLEXERIL) 10 MG tablet Take 10 mg by mouth at bedtime as needed for muscle spasms.     desloratadine (CLARINEX) 5 MG tablet Take 5 mg by mouth daily.     diclofenac sodium (VOLTAREN) 1 % GEL Apply 4 g topically 4 (four) times daily. 100 g 11   docusate sodium (COLACE) 100 MG capsule Take 200 mg by mouth 2 (two) times daily.     Dulaglutide (TRULICITY) 3 XN/2.3FT SOPN Inject 3 mg as directed once a week. 6 mL 3   Elastic Bandages & Supports (RIB BELT/WOMENS MEDIUM) MISC Use as needed for rib pain 1 each  0   Fluconazole (DIFLUCAN PO) Take 200 mg by mouth daily as needed (infection).     gabapentin (NEURONTIN) 300 MG capsule TAKE 4 CAPSULES BY MOUTH 3 TIMES DAILY. 360 capsule 0   halobetasol (ULTRAVATE) 0.05 % cream Apply 1 application topically 2 (two) times daily as needed (lichen sclerosus).   2   INVOKANA 300 MG TABS tablet TAKE 1 TABLET (300 MG TOTAL) BY MOUTH DAILY BEFORE BREAKFAST. 90 tablet 3   levothyroxine (SYNTHROID) 112 MCG tablet TAKE 1 TABLET BY MOUTH EVERY DAY 90 tablet 2   lidocaine (LIDODERM) 5 % Place 1 patch onto the skin daily. Remove & Discard patch within 12 hours or as directed by MD 30 patch 0   MAGNESIUM PO Take 4 tablets by mouth daily as needed (cramps).     metFORMIN (GLUCOPHAGE-XR) 500 MG 24 hr tablet TAKE 4 TABLETS BY MOUTH DAILY. 360 tablet 3   Multiple Vitamin (MULTIVITAMIN) tablet Take 1 tablet by mouth in the morning and at bedtime.     nitroGLYCERIN (NITROSTAT) 0.4 MG SL tablet Place 1 tablet (0.4 mg total) under the tongue every 5 (five) minutes as needed. 25  tablet 3   ondansetron (ZOFRAN ODT) 4 MG disintegrating tablet Take 1 tablet (4 mg total) by mouth every 8 (eight) hours as needed for nausea or vomiting. 20 tablet 0   ONETOUCH ULTRA test strip USE TO MONITOR GLUCOSE LEVELS ONCE PER DAY E11.40 100 strip 2   pantoprazole (PROTONIX) 40 MG tablet TAKE 1 TABLET BY MOUTH TWICE A DAY 180 tablet 1   phenazopyridine (PYRIDIUM) 100 MG tablet Take 100 mg by mouth 3 (three) times daily as needed for pain.     Probiotic CAPS Take 1 capsule by mouth daily.     repaglinide (PRANDIN) 2 MG tablet Take 1 tablet (2 mg total) by mouth 2 (two) times daily before a meal. 180 tablet 3   No current facility-administered medications for this visit.    Allergies-reviewed and updated Allergies  Allergen Reactions   Ace Inhibitors Cough   Caffeine     PVCs   Ciprofloxacin Itching   Morphine And Related Itching    Pt tolerates medication if given with diphenhydramine   Mucinex [Guaifenesin Er]     PVCs   Nsaids     Gastric Bypass Surgery - unable to take, tolerates ec 81 aspirin    Other     Antihistamine-alkylamine - PVCs tolerates benadryl    Silicone Hives   Tape Hives    Social History   Socioeconomic History   Marital status: Single    Spouse name: Not on file   Number of children: 0   Years of education: 16   Highest education level: Bachelor's degree (e.g., BA, AB, BS)  Occupational History   Occupation: Optometrist   Occupation: Retired  Tobacco Use   Smoking status: Former    Packs/day: 1.50    Years: 15.00    Pack years: 22.50    Types: Cigarettes    Quit date: 1992    Years since quitting: 30.5   Smokeless tobacco: Never  Vaping Use   Vaping Use: Never used  Substance and Sexual Activity   Alcohol use: Yes    Comment: rarely   Drug use: Never   Sexual activity: Not Currently  Other Topics Concern   Not on file  Social History Narrative   Not on file   Social Determinants of Health   Financial Resource Strain: Low Risk  Difficulty of Paying Living Expenses: Not hard at all  Food Insecurity: No Food Insecurity   Worried About [place] in the Last Year: Never true   Ran Out of Food in the Last Year: Never true  Transportation Needs: Unknown   Lack of Transportation (Medical): Not on file   Lack of Transportation (Non-Medical): No  Physical Activity: Sufficiently Active   Days of Exercise per Week: 5 days   Minutes of Exercise per Session: 60 min  Stress: No Stress Concern Present   Feeling of Stress : Not at all  Social Connections: Moderately Integrated   Frequency of Communication with Friends and Family: More than three times a week   Frequency of Social Gatherings with Friends and Family: More than three times a week   Attends Religious Services: 1 to 4 times per year   Active Member of [employer] or Organizations: Yes   Attends Archivist Meetings: 1 to 4 times per year   Marital Status: Never married        Objective:  Physical Exam: BP 108/72   Pulse 81   Temp 98 F (36.7 C) (Temporal)   Ht 5' 11"  (1.803 m)   Wt 161 lb 6.4 oz (73.2 kg)   SpO2 98%   BMI 22.51 kg/m   Gen: NAD, resting comfortably CV: RRR with no murmurs appreciated Pulm: NWOB, CTAB with no crackles, wheezes, or rhonchi GI: Normal bowel sounds present. Soft, Nontender, Nondistended. MSK: No edema, cyanosis, or clubbing noted Skin: Warm, dry Neuro: Grossly normal, moves all extremities Psych: Normal affect and thought content  Time Spent: 45 minutes of total time was spent on the date of the encounter performing the following actions: chart review prior to seeing the patient including her recent hospitalization, obtaining history, performing a medically necessary exam, counseling on the treatment plan, placing orders, and documenting in our EHR.        Algis Greenhouse. Jerline Pain, MD 05/18/2021 3:04 PM

## 2021-05-18 NOTE — Patient Instructions (Signed)
It was very nice to see you today!  On labs you are doing better.  We will check blood work today to make sure that your liver numbers are back to normal.  It is okay for you to take your tramadol.  We will restart your cholesterol medication once your liver numbers are back to normal.  Take care, Dr Jerline Pain  PLEASE NOTE:  If you had any lab tests please let us know if you have not heard back within a few days. You may see your results on mychart before we have a chance to review them but we will give you a call once they are reviewed by [place]. If we ordered any referrals today, please let us know if you have not heard from their office within the next week.   Please try these tips to maintain a healthy lifestyle:  Eat at least 3 REAL meals and 1-2 snacks per day.  Aim for no more than 5 hours between eating.  If you eat breakfast, please do so within one hour of getting up.   Each meal should contain half fruits/vegetables, one quarter protein, and one quarter carbs (no bigger than a computer mouse)  Cut down on sweet beverages. This includes juice, soda, and sweet tea.   Drink at least 1 glass of water with each meal and aim for at least 8 glasses per day  Exercise at least 150 minutes every week.

## 2021-05-19 ENCOUNTER — Ambulatory Visit (INDEPENDENT_AMBULATORY_CARE_PROVIDER_SITE_OTHER): Payer: Medicare Other | Admitting: Rheumatology

## 2021-05-19 ENCOUNTER — Telehealth: Payer: Self-pay | Admitting: Family Medicine

## 2021-05-19 ENCOUNTER — Other Ambulatory Visit: Payer: Self-pay | Admitting: Physician Assistant

## 2021-05-19 ENCOUNTER — Encounter: Payer: Self-pay | Admitting: Rheumatology

## 2021-05-19 VITALS — BP 116/81 | HR 77 | Ht 71.0 in | Wt 161.0 lb

## 2021-05-19 DIAGNOSIS — M542 Cervicalgia: Secondary | ICD-10-CM

## 2021-05-19 DIAGNOSIS — Z96651 Presence of right artificial knee joint: Secondary | ICD-10-CM | POA: Diagnosis not present

## 2021-05-19 DIAGNOSIS — M19071 Primary osteoarthritis, right ankle and foot: Secondary | ICD-10-CM

## 2021-05-19 DIAGNOSIS — K200 Eosinophilic esophagitis: Secondary | ICD-10-CM | POA: Diagnosis not present

## 2021-05-19 DIAGNOSIS — M545 Low back pain, unspecified: Secondary | ICD-10-CM | POA: Diagnosis not present

## 2021-05-19 DIAGNOSIS — Z8719 Personal history of other diseases of the digestive system: Secondary | ICD-10-CM | POA: Diagnosis not present

## 2021-05-19 DIAGNOSIS — M810 Age-related osteoporosis without current pathological fracture: Secondary | ICD-10-CM | POA: Diagnosis not present

## 2021-05-19 DIAGNOSIS — Z9884 Bariatric surgery status: Secondary | ICD-10-CM

## 2021-05-19 DIAGNOSIS — M19041 Primary osteoarthritis, right hand: Secondary | ICD-10-CM | POA: Diagnosis not present

## 2021-05-19 DIAGNOSIS — R7611 Nonspecific reaction to tuberculin skin test without active tuberculosis: Secondary | ICD-10-CM

## 2021-05-19 DIAGNOSIS — G8929 Other chronic pain: Secondary | ICD-10-CM

## 2021-05-19 DIAGNOSIS — M19072 Primary osteoarthritis, left ankle and foot: Secondary | ICD-10-CM

## 2021-05-19 DIAGNOSIS — M1990 Unspecified osteoarthritis, unspecified site: Secondary | ICD-10-CM | POA: Diagnosis not present

## 2021-05-19 DIAGNOSIS — K7581 Nonalcoholic steatohepatitis (NASH): Secondary | ICD-10-CM

## 2021-05-19 DIAGNOSIS — I2510 Atherosclerotic heart disease of native coronary artery without angina pectoris: Secondary | ICD-10-CM

## 2021-05-19 DIAGNOSIS — M1712 Unilateral primary osteoarthritis, left knee: Secondary | ICD-10-CM | POA: Diagnosis not present

## 2021-05-19 DIAGNOSIS — Z8639 Personal history of other endocrine, nutritional and metabolic disease: Secondary | ICD-10-CM

## 2021-05-19 DIAGNOSIS — M7061 Trochanteric bursitis, right hip: Secondary | ICD-10-CM

## 2021-05-19 DIAGNOSIS — E890 Postprocedural hypothyroidism: Secondary | ICD-10-CM

## 2021-05-19 DIAGNOSIS — Z8542 Personal history of malignant neoplasm of other parts of uterus: Secondary | ICD-10-CM

## 2021-05-19 DIAGNOSIS — Z8669 Personal history of other diseases of the nervous system and sense organs: Secondary | ICD-10-CM

## 2021-05-19 DIAGNOSIS — M7062 Trochanteric bursitis, left hip: Secondary | ICD-10-CM

## 2021-05-19 DIAGNOSIS — M19042 Primary osteoarthritis, left hand: Secondary | ICD-10-CM

## 2021-05-19 LAB — COMPREHENSIVE METABOLIC PANEL
ALT: 87 U/L — ABNORMAL HIGH (ref 0–35)
AST: 28 U/L (ref 0–37)
Albumin: 3.6 g/dL (ref 3.5–5.2)
Alkaline Phosphatase: 154 U/L — ABNORMAL HIGH (ref 39–117)
BUN: 11 mg/dL (ref 6–23)
CO2: 28 mEq/L (ref 19–32)
Calcium: 9 mg/dL (ref 8.4–10.5)
Chloride: 105 mEq/L (ref 96–112)
Creatinine, Ser: 0.88 mg/dL (ref 0.40–1.20)
GFR: 68.19 mL/min (ref >60.00)
Glucose, Bld: 68 mg/dL — ABNORMAL LOW (ref 70–99)
Potassium: 4.6 mEq/L (ref 3.5–5.1)
Sodium: 141 mEq/L (ref 135–145)
Total Bilirubin: 0.5 mg/dL (ref 0.2–1.2)
Total Protein: 6.3 g/dL (ref 6.0–8.3)

## 2021-05-19 MED ORDER — METHYLPREDNISOLONE ACETATE 40 MG/ML IJ SUSP
40.0000 mg | Freq: Once | INTRAMUSCULAR | Status: AC
  Administered 2021-05-19: 40 mg via INTRAMUSCULAR

## 2021-05-19 MED ORDER — PREDNISONE 5 MG PO TABS: ORAL_TABLET | ORAL | 0 refills | Status: DC

## 2021-05-19 NOTE — Telephone Encounter (Signed)
Spoke with patient she declined AWV at this time

## 2021-05-19 NOTE — Progress Notes (Signed)
Please inform patient of the following:  Liver numbers are much better and close to normal. It is ok for her to restart her cholesterol medication.  Savannah [name]. Jerline Pain, MD 05/19/2021 11:31 AM

## 2021-05-19 NOTE — Progress Notes (Signed)
Administrations This Visit     methylPREDNISolone acetate (DEPO-MEDROL) injection 40 mg     Admin Date 05/19/2021 Action Given Dose 40 mg Route Intramuscular Administered By Carole Binning, LPN            Patient tolerated injection well.

## 2021-05-19 NOTE — Addendum Note (Signed)
Addended by: Carole Binning on: 05/19/2021 01:21 PM   Modules accepted: Orders

## 2021-05-22 NOTE — Telephone Encounter (Signed)
Next Visit: 06/20/2021  Last Visit: 05/19/2021  Last Fill: 02/13/2021  Current Dose per office note on 05/19/2021: not discussed  Per protocol, okay to refill per Dr. Estanislado Pandy

## 2021-05-24 ENCOUNTER — Other Ambulatory Visit: Payer: Self-pay

## 2021-05-24 ENCOUNTER — Encounter: Payer: Self-pay | Admitting: Endocrinology

## 2021-05-24 ENCOUNTER — Ambulatory Visit (INDEPENDENT_AMBULATORY_CARE_PROVIDER_SITE_OTHER): Payer: Medicare Other | Admitting: Endocrinology

## 2021-05-24 VITALS — BP 106/60 | HR 89 | Ht 71.0 in | Wt 163.0 lb

## 2021-05-24 DIAGNOSIS — I2510 Atherosclerotic heart disease of native coronary artery without angina pectoris: Secondary | ICD-10-CM | POA: Diagnosis not present

## 2021-05-24 DIAGNOSIS — E890 Postprocedural hypothyroidism: Secondary | ICD-10-CM

## 2021-05-24 LAB — TSH: TSH: 4.98 u[IU]/mL (ref 0.35–5.50)

## 2021-05-24 MED ORDER — METFORMIN HCL ER 500 MG PO TB24: 2000.0000 mg | ORAL_TABLET | Freq: Every day | ORAL | 3 refills | Status: DC

## 2021-05-24 NOTE — Patient Instructions (Addendum)
Please continue the same 4 diabetes medications.   check your blood sugar once a day.  vary the time of day when you check, between before the 3 meals, and at bedtime.  also check if you have symptoms of your blood sugar being too high or too low.  please keep a record of the readings and bring it to your next appointment here (or you can bring the meter itself).  You can write it on any piece of paper.  please call us sooner if your blood sugar goes below 70, or if you have a lot of readings over 200.   Please come back for a follow-up appointment in 3-4 months.   Blood tests are requested for you today.  We'll let you know about the results.

## 2021-05-24 NOTE — Progress Notes (Signed)
Subjective:    Patient ID: Savannah [name], female    DOB: 1956/09/30, 65 y.o.   MRN: [id number]  HPI Pt returns for f/u of diabetes mellitus:  DM type: 2 Dx'ed: 4235 Complications: painful PN, and CAD (seen on CT).   Therapy: Trulicity and 3 oral meds.   GDM: never DKA: never Severe hypoglycemia: never.   Pancreatitis: never.  Other: she took insulin from 2004-2011, when she had gastric bypass surgery.  She is retired.   Interval history: She says cbg's vary from 60-300 (with recent steroids for pseudogout).  She takes meds as rx'ed.   Past Medical History:  Diagnosis Date   ABDOMINAL PAIN, CHRONIC 10/20/2009   Acute cystitis 08/17/2009   ALLERGIC RHINITIS 11/21/2007   Allergy    ASYMPTOMATIC POSTMENOPAUSAL STATUS 09/29/2008   Bariatric surgery status 07/07/2010   Bariatric surgery status 07/07/2010   Qualifier: Diagnosis of  By: Loanne Drilling MD, Tedric Leeth A    Cancer [facility])    uterine   Cataract    Coronary artery disease    DEPRESSION 11/21/2007   DIABETES MELLITUS, TYPE II 07/08/2007   DYSPNEA 05/20/2009   Edema 36/14/4315   Eosinophilic esophagitis 40/06/6760   FEVER UNSPECIFIED 10/20/2009   FEVER, HX OF 03/09/2010   GERD (gastroesophageal reflux disease)    Headache(784.0) 02/06/2010   Hepatomegaly 01/06/2008   HYPERLIPIDEMIA 07/08/2007   HYPERTENSION 07/08/2007   LEUKOPENIA, MILD 09/29/2008   OBESITY 01/06/2008   OSTEOARTHRITIS 01/06/2008   Other chronic nonalcoholic liver disease 95/07/3266   PERIPHERAL NEUROPATHY 11/21/2007   Peripheral neuropathy    Postsurgical hypothyroidism 09/19/2010   SINUSITIS- ACUTE-NOS 11/21/2007   TB SKIN TEST, POSITIVE 02/06/2010   THYROID NODULE 03/09/2010    Past Surgical History:  Procedure Laterality Date   ABDOMINAL HYSTERECTOMY     BASAL CELL CARCINOMA EXCISION     CARDIAC CATHETERIZATION     CHOLECYSTECTOMY N/A 05/11/2021   Procedure: LAPAROSCOPIC CHOLECYSTECTOMY WITH POSSIBLE  INTRAOPERATIVE CHOLANGIOGRAM;  Surgeon: Johnathan Hausen, MD;  Location: [facility];  Service: General;  Laterality: N/A;   COLONOSCOPY     EYE SURGERY Bilateral 08/2018, 09/2018   FOOT SURGERY Left    10/2019   KNEE ARTHROSCOPY Left    LEFT HEART CATH AND CORONARY ANGIOGRAPHY N/A 01/20/2019   Procedure: LEFT HEART CATH AND CORONARY ANGIOGRAPHY;  Surgeon: Belva Crome, MD;  Location: [place] [facility];  Service: Cardiovascular;  Laterality: N/A;   LYMPH NODE DISSECTION     OOPHORECTOMY     Right total knee replacement     ROUX-EN-Y GASTRIC BYPASS  2011   SKIN TAG REMOVAL     THYROIDECTOMY     TONSILLECTOMY AND ADENOIDECTOMY     UPPER GASTROINTESTINAL ENDOSCOPY      Social History   Socioeconomic History   Marital status: Single    Spouse name: Not on file   Number of children: 0   Years of education: 16   Highest education level: Bachelor's degree (e.g., BA, AB, BS)  Occupational History   Occupation: Optometrist   Occupation: Retired  Tobacco Use   Smoking status: Former    Packs/day: 1.50    Years: 15.00    Pack years: 22.50    Types: Cigarettes    Quit date: 1992    Years since quitting: 30.5   Smokeless tobacco: Never  Vaping Use   Vaping Use: Never used  Substance and Sexual Activity   Alcohol use: Yes    Comment: rarely   Drug use:  Never   Sexual activity: Not Currently  Other Topics Concern   Not on file  Social History Narrative   Not on file   Social Determinants of Health   Financial Resource Strain: Not on file  Food Insecurity: Not on file  Transportation Needs: Not on file  Physical Activity: Not on file  Stress: Not on file  Social Connections: Not on file  Intimate Partner Violence: Not on file    Current Outpatient Medications on File Prior to Visit  Medication Sig Dispense Refill   acetaminophen (TYLENOL) 500 MG tablet Take 2 tablets (1,000 mg total) by mouth every 6 (six) hours as needed for mild pain or fever. 30 tablet 0   AMBULATORY NON FORMULARY MEDICATION Administrator at  [employer]  Correct leg length and improve pronation BL 1 each 0   aspirin EC 81 MG tablet Take 1 tablet (81 mg total) by mouth daily. Starting 01/18/19 prior to cardiac cath     Cholecalciferol (VITAMIN D) 50 MCG (2000 UT) tablet Take 4,000 Units by mouth daily.     CLINPRO 5000 1.1 % PSTE See admin instructions.     Colchicine 0.6 MG CAPS TAKE 1 CAPSULE BY MOUTH EVERY DAY 90 capsule 0   cyclobenzaprine (FLEXERIL) 10 MG tablet Take 10 mg by mouth at bedtime as needed for muscle spasms.     desloratadine (CLARINEX) 5 MG tablet Take 5 mg by mouth daily.     diclofenac sodium (VOLTAREN) 1 % GEL Apply 4 g topically 4 (four) times daily. 100 g 11   docusate sodium (COLACE) 100 MG capsule Take 200 mg by mouth 2 (two) times daily.     Dulaglutide (TRULICITY) 3 YK/5.9DJ SOPN Inject 3 mg as directed once a week. 6 mL 3   Elastic Bandages & Supports (RIB BELT/WOMENS MEDIUM) MISC Use as needed for rib pain 1 each 0   gabapentin (NEURONTIN) 300 MG capsule TAKE 4 CAPSULES BY MOUTH 3 TIMES DAILY. 360 capsule 0   halobetasol (ULTRAVATE) 0.05 % cream Apply 1 application topically 2 (two) times daily as needed (lichen sclerosus).   2   INVOKANA 300 MG TABS tablet TAKE 1 TABLET (300 MG TOTAL) BY MOUTH DAILY BEFORE BREAKFAST. 90 tablet 3   levothyroxine (SYNTHROID) 112 MCG tablet TAKE 1 TABLET BY MOUTH EVERY DAY 90 tablet 2   lidocaine (LIDODERM) 5 % PLACE 1 PATCH ONTO THE SKIN DAILY. REMOVE & DISCARD PATCH WITHIN 12 HOURS OR AS DIRECTED BY MD 30 patch 0   MAGNESIUM PO Take 4 tablets by mouth daily as needed (cramps).     Multiple Vitamin (MULTIVITAMIN) tablet Take 1 tablet by mouth in the morning and at bedtime.     nitroGLYCERIN (NITROSTAT) 0.4 MG SL tablet Place 1 tablet (0.4 mg total) under the tongue every 5 (five) minutes as needed. 25 tablet 3   ondansetron (ZOFRAN ODT) 4 MG disintegrating tablet Take 1 tablet (4 mg total) by mouth every 8 (eight) hours as needed for nausea or vomiting. 20 tablet 0    ONETOUCH ULTRA test strip USE TO MONITOR GLUCOSE LEVELS ONCE PER DAY E11.40 100 strip 2   OXYCODONE HCL PO Take by mouth as needed.     pantoprazole (PROTONIX) 40 MG tablet TAKE 1 TABLET BY MOUTH TWICE A DAY 180 tablet 1   predniSONE (DELTASONE) 5 MG tablet Take 4 tabs po qd x 2 days, 3  tabs po qd x 2 days, 2  tabs po qd x 2 days,  1  tab po qd x 2 days 20 tablet 0   Probiotic CAPS Take 1 capsule by mouth daily.     repaglinide (PRANDIN) 2 MG tablet Take 1 tablet (2 mg total) by mouth 2 (two) times daily before a meal. 180 tablet 3   TRAMADOL HCL PO Take by mouth as needed.     No current facility-administered medications on file prior to visit.    Allergies  Allergen Reactions   Ace Inhibitors Cough   Caffeine     PVCs   Ciprofloxacin Itching   Morphine And Related Itching    Pt tolerates medication if given with diphenhydramine   Mucinex [Guaifenesin Er]     PVCs   Nsaids     Gastric Bypass Surgery - unable to take, tolerates ec 81 aspirin    Other     Antihistamine-alkylamine - PVCs tolerates benadryl    Silicone Hives   Tape Hives    Family History  Problem Relation Age of Onset   Multiple sclerosis Sister    Breast cancer Sister        breast onset age 67   Osteoporosis Sister    Diabetes Mother    Diabetes Father    Congestive Heart Failure Father    Hypertension Father    Myelodysplastic syndrome Father    Liver cancer Maternal Aunt    Breast cancer Maternal Aunt    Diabetes Maternal Aunt    Colon cancer Maternal Uncle    Diabetes Maternal Uncle    Diabetes Maternal Grandmother    Diabetes Paternal Grandmother     BP 106/60 (BP Location: Left Arm, Patient Position: Sitting, Cuff Size: Normal)   Pulse 89   Ht 5' 11"  (1.803 m)   Wt 163 lb (73.9 kg)   SpO2 96%   BMI 22.73 kg/m    Review of Systems Denies n/v/HB    Objective:   Physical Exam Pulses: dorsalis pedis intact bilat.   MSK: no deformity of the feet CV: 2+ left, and 1+ right leg  edema Skin:  no ulcer on the feet.  normal color and temp on the feet. Neuro: sensation is intact to touch on the feet, but decreased from normal.    Lab Results  Component Value Date   HGBA1C 7.0 (H) 05/13/2021      Assessment & Plan:  Type 2 DM: We discussed increasing Trulicity and decreasing repaglinide.  She declines.   Hypothyroidism: recheck today.    Patient Instructions  Please continue the same 4 diabetes medications.   check your blood sugar once a day.  vary the time of day when you check, between before the 3 meals, and at bedtime.  also check if you have symptoms of your blood sugar being too high or too low.  please keep a record of the readings and bring it to your next appointment here (or you can bring the meter itself).  You can write it on any piece of paper.  please call us sooner if your blood sugar goes below 70, or if you have a lot of readings over 200.   Please come back for a follow-up appointment in 3-4 months.   Blood tests are requested for you today.  We'll let you know about the results.

## 2021-05-30 ENCOUNTER — Ambulatory Visit: Payer: Medicare Other | Admitting: Orthopaedic Surgery

## 2021-06-06 ENCOUNTER — Ambulatory Visit (INDEPENDENT_AMBULATORY_CARE_PROVIDER_SITE_OTHER): Payer: Medicare Other | Admitting: Orthopaedic Surgery

## 2021-06-06 ENCOUNTER — Other Ambulatory Visit: Payer: Self-pay

## 2021-06-06 DIAGNOSIS — M25531 Pain in right wrist: Secondary | ICD-10-CM

## 2021-06-06 DIAGNOSIS — I2510 Atherosclerotic heart disease of native coronary artery without angina pectoris: Secondary | ICD-10-CM

## 2021-06-06 NOTE — Progress Notes (Signed)
Office Visit Note  Patient: Savannah [name]             Date of Birth: Jan 25, 1956           MRN: [id number]             PCP: Vivi Barrack, MD Referring: Vivi Barrack, MD Visit Date: 06/20/2021 Occupation: @GUAROCC @  Subjective:  Discuss osteoporosis treatment options   History of Present Illness: Savannah [name] is a 65 y.o. female with history of inflammatory arthritis and osteoporosis. She takes colchicine 0.6 mg 1 capsule by mouth daily.  She was prescribed a prednisone taper starting at 20 mg tapering by 5 mg every 2 days and was given a depo-medrol 40 mg injection at her last visit on 05/19/21 due to experiencing severe pain or inflammation in her right wrist.  She states that the prednisone taper helped with the pain and swelling but she continues to have persistent discomfort.  She states that she is scheduled for an MRI of the right wrist on Saturday.  She plans on following up with Dr. Erlinda Hong to discuss results. She has been using a brace as needed for support.  She is no longer taking tramadol or any OTC products for pain relief. She states she tried taking colchicine BID for several weeks, which significantly helps her joint pain and stiffness but since Saturday resumed taking it once daily.  She presents today to discuss osteoporosis treatment options.  She has been taking Vitamin D 4,000 units daily but has not taken a calcium supplement.  According to the patient she was advised to continue a calcium supplement after her bariatric surgery but she has not taken one several years despite her calcium level being low.      Activities of Daily Living:  Patient reports morning stiffness for 0 minutes.   Patient Reports nocturnal pain.  Difficulty dressing/grooming: Reports Difficulty climbing stairs: Reports Difficulty getting out of chair: Reports Difficulty using hands for taps, buttons, cutlery, and/or writing: Reports  Review of Systems  Constitutional:  Positive for fatigue.   HENT:  Positive for mouth dryness. Negative for mouth sores and nose dryness.   Eyes:  Positive for dryness. Negative for pain and itching.  Respiratory:  Negative for shortness of breath and difficulty breathing.   Cardiovascular:  Negative for chest pain and palpitations.  Gastrointestinal:  Positive for constipation. Negative for blood in stool and diarrhea.  Endocrine: Negative for increased urination.  Genitourinary:  Negative for difficulty urinating.  Musculoskeletal:  Positive for joint pain, joint pain and joint swelling. Negative for myalgias, morning stiffness, muscle tenderness and myalgias.  Skin:  Negative for color change, rash and redness.  Allergic/Immunologic: Negative for susceptible to infections.  Neurological:  Positive for dizziness and numbness. Negative for headaches, memory loss and weakness.  Hematological:  Positive for bruising/bleeding tendency.  Psychiatric/Behavioral:  Negative for confusion.    PMFS History:  Patient Active Problem List   Diagnosis Date Noted   Osteopenia 01/14/2021   Sesamoiditis of left foot 11/09/2020   Posterior tibial tendinitis of left lower extremity 11/09/2020   Type 2 diabetes mellitus with diabetic peripheral angiopathy without gangrene, with long-term current use of insulin ([place]) 10/21/2020   Iron deficiency anemia 03/14/2020   Deficiency anemia 02/29/2020   Lesion of vulva 09/02/2019   Menorrhagia 09/02/2019   Neuropathy 09/02/2019   Irregular bowel habits 06/25/2019   Coronary artery disease involving native coronary artery of native heart without angina pectoris  Temporomandibular joint disorder 12/05/2018   Fatigue 12/05/2018   Inverted nipple 12/05/2018   Senile purpura ([place]) 11/21/2018   GERD with esophagitis 11/21/2018   Pseudogout involving multiple joints 11/21/2018   Lipoma 24/40/1027   Lichen sclerosus 25/36/6440   H/O gastric bypass 11/16/2016   GERD (gastroesophageal reflux disease) 11/16/2016    Depression 11/16/2016   Diabetic polyneuropathy associated with type 2 diabetes mellitus ([place]) 11/16/2016   Uterine cancer ([place]) s/p hysterectomy 1997 01/04/2012   Postsurgical hypothyroidism 09/19/2010   Diabetic neuropathy ([place]) 11/21/2007   Allergic rhinitis 11/21/2007   Dyslipidemia associated with type 2 diabetes mellitus ([place]) 07/08/2007    Past Medical History:  Diagnosis Date   ABDOMINAL PAIN, CHRONIC 10/20/2009   Acute cystitis 08/17/2009   ALLERGIC RHINITIS 11/21/2007   Allergy    ASYMPTOMATIC POSTMENOPAUSAL STATUS 09/29/2008   Bariatric surgery status 07/07/2010   Bariatric surgery status 07/07/2010   Qualifier: Diagnosis of  By: Loanne Drilling MD, Sean A    Cancer [facility])    uterine   Cataract    Coronary artery disease    DEPRESSION 11/21/2007   DIABETES MELLITUS, TYPE II 07/08/2007   DYSPNEA 05/20/2009   Edema 34/74/2595   Eosinophilic esophagitis 63/87/5643   FEVER UNSPECIFIED 10/20/2009   FEVER, HX OF 03/09/2010   GERD (gastroesophageal reflux disease)    Headache(784.0) 02/06/2010   Hepatomegaly 01/06/2008   HYPERLIPIDEMIA 07/08/2007   HYPERTENSION 07/08/2007   LEUKOPENIA, MILD 09/29/2008   OBESITY 01/06/2008   OSTEOARTHRITIS 01/06/2008   Other chronic nonalcoholic liver disease 32/95/1884   PERIPHERAL NEUROPATHY 11/21/2007   Peripheral neuropathy    Postsurgical hypothyroidism 09/19/2010   SINUSITIS- ACUTE-NOS 11/21/2007   TB SKIN TEST, POSITIVE 02/06/2010   THYROID NODULE 03/09/2010    Family History  Problem Relation Age of Onset   Diabetes Mother    Diabetes Father    Congestive Heart Failure Father    Hypertension Father    Myelodysplastic syndrome Father    Bladder Cancer Sister    Multiple sclerosis Sister    Breast cancer Sister        breast onset age 73   Osteoporosis Sister    Liver cancer Maternal Aunt    Breast cancer Maternal Aunt    Diabetes Maternal Aunt    Colon cancer Maternal Uncle    Diabetes Maternal Uncle    Diabetes Maternal  Grandmother    Diabetes Paternal Grandmother    Past Surgical History:  Procedure Laterality Date   ABDOMINAL HYSTERECTOMY     BASAL CELL CARCINOMA EXCISION     CARDIAC CATHETERIZATION     CHOLECYSTECTOMY N/A 05/11/2021   Procedure: LAPAROSCOPIC CHOLECYSTECTOMY WITH POSSIBLE  INTRAOPERATIVE CHOLANGIOGRAM;  Surgeon: Johnathan Hausen, MD;  Location: [facility];  Service: General;  Laterality: N/A;   COLONOSCOPY     EYE SURGERY Bilateral 08/2018, 09/2018   FOOT SURGERY Left    10/2019   KNEE ARTHROSCOPY Left    LEFT HEART CATH AND CORONARY ANGIOGRAPHY N/A 01/20/2019   Procedure: LEFT HEART CATH AND CORONARY ANGIOGRAPHY;  Surgeon: Belva Crome, MD;  Location: [place] [facility];  Service: Cardiovascular;  Laterality: N/A;   LYMPH NODE DISSECTION     OOPHORECTOMY     Right total knee replacement     ROUX-EN-Y GASTRIC BYPASS  2011   SKIN TAG REMOVAL     THYROIDECTOMY     TONSILLECTOMY AND ADENOIDECTOMY     UPPER GASTROINTESTINAL ENDOSCOPY     Social History   Social History Narrative  Not on file   Immunization History  Administered Date(s) Administered   Fluad Quad(high Dose 65+) 07/13/2019   Influenza Inj Mdck Quad Pf 10/19/2017, 07/23/2018   Influenza Whole 07/30/2008, 08/17/2009, 09/19/2010   Influenza,inj,Quad PF,6-35 Mos 07/14/2015   Influenza-Unspecified 10/13/2011, 08/12/2013, 07/13/2017, 07/23/2018   PFIZER(Purple Top)SARS-COV-2 Vaccination 12/21/2019, 01/14/2020, 08/18/2020   Pneumococcal Conjugate-13 10/14/2015   Pneumococcal Polysaccharide-23 06/25/2014, 10/21/2020   Tdap 05/20/2020   Zoster Recombinat (Shingrix) 05/20/2020   Zoster, Live 06/25/2014     Objective: Vital Signs: BP 103/67 (BP Location: Left Arm, Patient Position: Sitting, Cuff Size: Normal)   Pulse 88   Ht 5' 11"  (1.803 m)   Wt 165 lb (74.8 kg)   BMI 23.01 kg/m    Physical Exam Vitals and nursing note reviewed.  Constitutional:      Appearance: She is well-developed.  HENT:     Head:  Normocephalic and atraumatic.  Eyes:     Conjunctiva/sclera: Conjunctivae normal.  Pulmonary:     Effort: Pulmonary effort is normal.  Abdominal:     Palpations: Abdomen is soft.  Musculoskeletal:     Cervical back: Normal range of motion.  Skin:    General: Skin is warm and dry.     Capillary Refill: Capillary refill takes less than 2 seconds.  Neurological:     Mental Status: She is alert and oriented to person, place, and time.  Psychiatric:        Behavior: Behavior normal.     Musculoskeletal Exam: C-spine has limited ROM with discomfort.  Painful ROM of right shoulder joint.  Tenderness mild tenosynovitis over the right wrist.  PIP and DIP thickening consistent with OA of both hands.  CMC joint thickening noted bilaterally.  Hip joints have good ROM with discomfort bilaterally.  Right knee replacement has good ROM with no discomfort.  Left knee crepitus noted.  Ankle joints have good ROM.  Pedal edema noted bilaterally.   CDAI Exam: CDAI Score: -- Patient Global: --; Provider Global: -- Swollen: --; Tender: -- Joint Exam 06/20/2021   No joint exam has been documented for this visit   There is currently no information documented on the homunculus. Go to the Rheumatology activity and complete the homunculus joint exam.  Investigation: No additional findings.  Imaging: No results found.  Recent Labs: Lab Results  Component Value Date   WBC 5.8 05/12/2021   HGB 11.5 (L) 05/12/2021   PLT 136 (L) 05/12/2021   NA 141 05/18/2021   K 4.6 05/18/2021   CL 105 05/18/2021   CO2 28 05/18/2021   GLUCOSE 68 (L) 05/18/2021   BUN 11 05/18/2021   CREATININE 0.88 05/18/2021   BILITOT 0.5 05/18/2021   ALKPHOS 154 (H) 05/18/2021   AST 28 05/18/2021   ALT 87 (H) 05/18/2021   PROT 6.3 05/18/2021   ALBUMIN 3.6 05/18/2021   CALCIUM 9.0 05/18/2021   GFRAA 73 08/16/2020    Speciality Comments: No specialty comments available.  Procedures:  No procedures performed Allergies:  Ace inhibitors, Caffeine, Ciprofloxacin, Morphine and related, Mucinex [guaifenesin er], Nsaids, Other, Silicone, and Tape   Assessment / Plan:     Visit Diagnoses: Inflammatory arthritis - RF-, anti-CCP-, 14-3-3 eta-, ANA-, HLAB27-, uric acid 3.8. Responsive to colchicine-likely she has pseudogout: She presents today with ongoing pain and inflammation in the right wrist.  She was evaluated on 05/19/2021 by Dr. Dimas Alexandria and was prescribed a prednisone taper starting at 20 mg tapering by 5 mg every 2 days.  She was  also given a Depo-Medrol 40 mg intramuscular injection while in the office.  According to the patient the pain and inflammation improved significantly after the prednisone taper but her discomfort has gradually been worsening.  She has been using a brace as needed.  She is scheduled for an MRI of the right wrist on Saturday.  She plans on following up with Dr. Erlinda Hong to discuss MRI results once completed.  She continues to have generalized arthralgias but no synovitis was noted on examination today.  She has been taking colchicine 0.6 mg 1 capsule by mouth daily.  She occasionally takes colchicine twice daily during flares.  Refill of colchicine was sent to the pharmacy today.  She was advised to notify us if she develops increased joint pain or joint swelling.  Primary osteoarthritis of both hands: She has severe PIP and DIP thickening consistent with osteoarthritis of both hands.  Difficulty making a complete fist due to stiffness in PIP and DIP joints.  CMC joint prominence and thickening noted bilaterally.  Trochanteric bursitis of both hips: She has tenderness ovation over bilateral trochanteric bursa.  Status post total right knee replacement - Performed by Dr. Veverly Fells.  Doing well.  Primary osteoarthritis of left knee: She has good range of motion of the left knee with crepitus.  No warmth or effusion was noted on examination today.  Primary osteoarthritis of both feet: She has occasional  discomfort in both feet.  Severe pitting edema noted bilateral lower extremities.  Age-related osteoporosis without current pathological fracture - DEXA updated on 01/11/21: Right femoral neck- T-score -2.5 and a -25.9% change in BMD from previous DEXA.  DEXA results were reviewed today while the patient was in the office.  According to patient she has had several fractures within the past year including a vertebral fracture as well as rib fractures.  She is also been having more frequent falls.  She has been taking vitamin D 4000 units daily but has not been taking a calcium supplement.  She has a history of hypocalcemia status post bariatric surgery in the past.  She has not taken a calcium supplement in several years despite her PCP recommending supplementation. Different treatment options were discussed today in detail for the management of osteoporosis.  She is not a good candidate for oral bisphosphonates due to history of GERD and esophagitis.  Discussed proceeding with Reclast versus Prolia.  She is concerned about the risk for osteonecrosis of the jaw and plans to schedule appointment with her dentist to further discuss proceeding with treatment.  I also discussed the concern for hypocalcemia while on Prolia.  She was strongly encouraged to take a calcium supplement 1200 mg daily and remain on vitamin D as recommended previously.  She will require the following baseline labs prior to proceeding with treatment.  She declined lab work at this time.  She would like to further discuss with her PCP and dentist prior to proceeding with therapy.- Plan: COMPLETE METABOLIC PANEL WITH GFR, CBC with Differential/Platelet, VITAMIN D 25 Hydroxy (Vit-D Deficiency, Fractures), Phosphorus, Parathyroid hormone, intact (no Ca), Serum protein electrophoresis with reflex  Medication monitoring encounter -CBC and CMP will need to be updated prior to starting therapy.  She declined updated lab work today.  Plan: COMPLETE  METABOLIC PANEL WITH GFR, CBC with Differential/Platelet  Vitamin D deficiency - She has been taking vitamin D 4000 units daily.  She will require an updated vitamin D level prior to starting on treatment for osteoporosis.  She declined updated  lab work today.  Plan: VITAMIN D 25 Hydroxy (Vit-D Deficiency, Fractures)  Neck pain - She was previously having injections by her neurologist in [place].  Chronic pain and stiffness.  No symptoms of radiculopathy.  Chronic midline low back pain without sciatica - Referred to neurology.  Declined referral for pain management.  She is not experiencing any symptoms of sciatica at this time.  Other medical conditions are listed as follows:  Status post gastric bypass for obesity  History of gastroesophageal reflux (GERD)  NASH (nonalcoholic steatohepatitis)  History of peripheral neuropathy - She takes gabapentin 300 mg 4 capsules by mouth daily for management of neuropathy.   Eosinophilic esophagitis  History of diabetes mellitus, type II  History of hyperlipidemia  Positive PPD  History of uterine cancer  Postsurgical hypothyroidism  Status post cholecystectomy    Orders: Orders Placed This Encounter  Procedures   COMPLETE METABOLIC PANEL WITH GFR   CBC with Differential/Platelet   VITAMIN D 25 Hydroxy (Vit-D Deficiency, Fractures)   Phosphorus   Parathyroid hormone, intact (no Ca)   Serum protein electrophoresis with reflex   Meds ordered this encounter  Medications   Colchicine 0.6 MG CAPS    Sig: Take 1 tablet by mouth daily.    Dispense:  90 capsule    Refill:  0      Follow-Up Instructions: Return in about 5 months (around 11/20/2021) for Inflammatory arthritis , Osteoporosis.   Ofilia Neas, PA-C  Note - This record has been created using Dragon software.  Chart creation errors have been sought, but may not always  have been located. Such creation errors do not reflect on  the standard of medical care.

## 2021-06-06 NOTE — Progress Notes (Signed)
Office Visit Note   Patient: Savannah [name]           Date of Birth: September 30, 1956           MRN: [id number] Visit Date: 06/06/2021              Requested by: Vivi Barrack, MD [address] [place],  [place] [address] PCP: Vivi Barrack, MD   Assessment & Plan: Visit Diagnoses:  1. Pain in right wrist     Plan: Impression is chronic right wrist pain with underlying DRUJ and carpal tunnel syndrome.  Due to the patient's chronic symptoms not alleviated with conservative treatment, we will order an MRI to further assess for structural abnormalities.  In regards to the carpal tunnel syndrome she will try and obtain records of the nerve conduction study to discuss at her follow-up appointment.  Call with concerns or questions in the meantime.  Follow-Up Instructions: Return for after MRI.   Orders:  Orders Placed This Encounter  Procedures   MR Wrist Right w/o contrast    No orders of the defined types were placed in this encounter.     Procedures: No procedures performed   Clinical Data: No additional findings.   Subjective: Chief Complaint  Patient presents with   Right Wrist - Follow-up    HPI patient is a pleasant 65 year old female who comes in today with continued right wrist pain.  She was seen in our office recently for DRUJ arthritis.  She has been wearing the wrist splint without significant relief, but notes about 2 weeks ago her symptoms dramatically worsened.  She was seen by Dr. Charlesetta Shanks for pseudogout.  She was given IM steroid injection as well as a course of prednisone which did seem to settle things down.  The underlying arthritic pain has not improved.  She continues to have pain with any movement of the wrist and notes that she has had a hard time even holding a toothbrush or utensils while eating.  The pain is to the entire wrist but worse to the volar aspect.  She is unable to take NSAIDs due to previous bariatric surgery.  Of note, she has paresthesias to  the right hand and has an underlying history of carpal tunnel syndrome.  Previous nerve conduction studies were done by [facility] neurology.  Review of Systems as detailed in HPI.  All others reviewed and are negative.   Objective: Vital Signs: There were no vitals taken for this visit.  Physical Exam well-developed well-nourished female no acute distress.  Alert and oriented x3.  Ortho Exam examination of the right wrist reveals diffuse tenderness.  I am unable to pinpoint 1 area of pain.  She has increased pain with range of motion.  She is neurovascular intact distally.  Specialty Comments:  No specialty comments available.  Imaging: No new imaging   PMFS History: Patient Active Problem List   Diagnosis Date Noted   Osteopenia 01/14/2021   Sesamoiditis of left foot 11/09/2020   Posterior tibial tendinitis of left lower extremity 11/09/2020   Type 2 diabetes mellitus with diabetic peripheral angiopathy without gangrene, with long-term current use of insulin ([place]) 10/21/2020   Iron deficiency anemia 03/14/2020   Deficiency anemia 02/29/2020   Lesion of vulva 09/02/2019   Menorrhagia 09/02/2019   Neuropathy 09/02/2019   Irregular bowel habits 06/25/2019   Coronary artery disease involving native coronary artery of native heart without angina pectoris    Temporomandibular joint disorder 12/05/2018  Fatigue 12/05/2018   Inverted nipple 12/05/2018   Senile purpura ([place]) 11/21/2018   GERD with esophagitis 11/21/2018   Pseudogout involving multiple joints 11/21/2018   Lipoma 94/70/9628   Lichen sclerosus 36/62/9476   H/O gastric bypass 11/16/2016   GERD (gastroesophageal reflux disease) 11/16/2016   Depression 11/16/2016   Diabetic polyneuropathy associated with type 2 diabetes mellitus ([place]) 11/16/2016   Uterine cancer ([place]) s/p hysterectomy 1997 01/04/2012   Postsurgical hypothyroidism 09/19/2010   Diabetic neuropathy ([place]) 11/21/2007   Allergic rhinitis 11/21/2007    Dyslipidemia associated with type 2 diabetes mellitus ([place]) 07/08/2007   Past Medical History:  Diagnosis Date   ABDOMINAL PAIN, CHRONIC 10/20/2009   Acute cystitis 08/17/2009   ALLERGIC RHINITIS 11/21/2007   Allergy    ASYMPTOMATIC POSTMENOPAUSAL STATUS 09/29/2008   Bariatric surgery status 07/07/2010   Bariatric surgery status 07/07/2010   Qualifier: Diagnosis of  By: Loanne Drilling MD, Sean A    Cancer [facility])    uterine   Cataract    Coronary artery disease    DEPRESSION 11/21/2007   DIABETES MELLITUS, TYPE II 07/08/2007   DYSPNEA 05/20/2009   Edema 54/65/0354   Eosinophilic esophagitis 65/68/1275   FEVER UNSPECIFIED 10/20/2009   FEVER, HX OF 03/09/2010   GERD (gastroesophageal reflux disease)    Headache(784.0) 02/06/2010   Hepatomegaly 01/06/2008   HYPERLIPIDEMIA 07/08/2007   HYPERTENSION 07/08/2007   LEUKOPENIA, MILD 09/29/2008   OBESITY 01/06/2008   OSTEOARTHRITIS 01/06/2008   Other chronic nonalcoholic liver disease 17/00/1749   PERIPHERAL NEUROPATHY 11/21/2007   Peripheral neuropathy    Postsurgical hypothyroidism 09/19/2010   SINUSITIS- ACUTE-NOS 11/21/2007   TB SKIN TEST, POSITIVE 02/06/2010   THYROID NODULE 03/09/2010    Family History  Problem Relation Age of Onset   Multiple sclerosis Sister    Breast cancer Sister        breast onset age 47   Osteoporosis Sister    Diabetes Mother    Diabetes Father    Congestive Heart Failure Father    Hypertension Father    Myelodysplastic syndrome Father    Liver cancer Maternal Aunt    Breast cancer Maternal Aunt    Diabetes Maternal Aunt    Colon cancer Maternal Uncle    Diabetes Maternal Uncle    Diabetes Maternal Grandmother    Diabetes Paternal Grandmother     Past Surgical History:  Procedure Laterality Date   ABDOMINAL HYSTERECTOMY     BASAL CELL CARCINOMA EXCISION     CARDIAC CATHETERIZATION     CHOLECYSTECTOMY N/A 05/11/2021   Procedure: LAPAROSCOPIC CHOLECYSTECTOMY WITH POSSIBLE  INTRAOPERATIVE  CHOLANGIOGRAM;  Surgeon: Johnathan Hausen, MD;  Location: [facility];  Service: General;  Laterality: N/A;   COLONOSCOPY     EYE SURGERY Bilateral 08/2018, 09/2018   FOOT SURGERY Left    10/2019   KNEE ARTHROSCOPY Left    LEFT HEART CATH AND CORONARY ANGIOGRAPHY N/A 01/20/2019   Procedure: LEFT HEART CATH AND CORONARY ANGIOGRAPHY;  Surgeon: Belva Crome, MD;  Location: [place] [facility];  Service: Cardiovascular;  Laterality: N/A;   LYMPH NODE DISSECTION     OOPHORECTOMY     Right total knee replacement     ROUX-EN-Y GASTRIC BYPASS  2011   SKIN TAG REMOVAL     THYROIDECTOMY     TONSILLECTOMY AND ADENOIDECTOMY     UPPER GASTROINTESTINAL ENDOSCOPY     Social History   Occupational History   Occupation: Optometrist   Occupation: Retired  Tobacco Use   Smoking status:  Former    Packs/day: 1.50    Years: 15.00    Pack years: 22.50    Types: Cigarettes    Quit date: 1992    Years since quitting: 30.5   Smokeless tobacco: Never  Vaping Use   Vaping Use: Never used  Substance and Sexual Activity   Alcohol use: Yes    Comment: rarely   Drug use: Never   Sexual activity: Not Currently

## 2021-06-13 ENCOUNTER — Telehealth: Payer: Self-pay | Admitting: Orthopaedic Surgery

## 2021-06-13 NOTE — Telephone Encounter (Signed)
Pt called stating she had records faxed back in May and she's pretty sure our received them because Dr. Erlinda Hong had read results from one of her tests to her; however now the pt states someone in our office told her we didn't have them. Pt would like a CB to verify wether we have the records from [place] [facility].   [phone]

## 2021-06-20 ENCOUNTER — Encounter: Payer: Self-pay | Admitting: Physician Assistant

## 2021-06-20 ENCOUNTER — Other Ambulatory Visit: Payer: Self-pay

## 2021-06-20 ENCOUNTER — Ambulatory Visit (INDEPENDENT_AMBULATORY_CARE_PROVIDER_SITE_OTHER): Payer: Medicare Other | Admitting: Physician Assistant

## 2021-06-20 VITALS — BP 103/67 | HR 88 | Ht 71.0 in | Wt 165.0 lb

## 2021-06-20 DIAGNOSIS — Z5181 Encounter for therapeutic drug level monitoring: Secondary | ICD-10-CM

## 2021-06-20 DIAGNOSIS — Z9884 Bariatric surgery status: Secondary | ICD-10-CM | POA: Diagnosis not present

## 2021-06-20 DIAGNOSIS — M542 Cervicalgia: Secondary | ICD-10-CM

## 2021-06-20 DIAGNOSIS — G8929 Other chronic pain: Secondary | ICD-10-CM

## 2021-06-20 DIAGNOSIS — Z9049 Acquired absence of other specified parts of digestive tract: Secondary | ICD-10-CM

## 2021-06-20 DIAGNOSIS — E890 Postprocedural hypothyroidism: Secondary | ICD-10-CM

## 2021-06-20 DIAGNOSIS — Z8719 Personal history of other diseases of the digestive system: Secondary | ICD-10-CM

## 2021-06-20 DIAGNOSIS — M19071 Primary osteoarthritis, right ankle and foot: Secondary | ICD-10-CM

## 2021-06-20 DIAGNOSIS — M1990 Unspecified osteoarthritis, unspecified site: Secondary | ICD-10-CM | POA: Diagnosis not present

## 2021-06-20 DIAGNOSIS — M1712 Unilateral primary osteoarthritis, left knee: Secondary | ICD-10-CM | POA: Diagnosis not present

## 2021-06-20 DIAGNOSIS — M810 Age-related osteoporosis without current pathological fracture: Secondary | ICD-10-CM | POA: Diagnosis not present

## 2021-06-20 DIAGNOSIS — Z96651 Presence of right artificial knee joint: Secondary | ICD-10-CM | POA: Diagnosis not present

## 2021-06-20 DIAGNOSIS — Z8639 Personal history of other endocrine, nutritional and metabolic disease: Secondary | ICD-10-CM

## 2021-06-20 DIAGNOSIS — M7061 Trochanteric bursitis, right hip: Secondary | ICD-10-CM | POA: Diagnosis not present

## 2021-06-20 DIAGNOSIS — R7611 Nonspecific reaction to tuberculin skin test without active tuberculosis: Secondary | ICD-10-CM

## 2021-06-20 DIAGNOSIS — K7581 Nonalcoholic steatohepatitis (NASH): Secondary | ICD-10-CM

## 2021-06-20 DIAGNOSIS — M19042 Primary osteoarthritis, left hand: Secondary | ICD-10-CM

## 2021-06-20 DIAGNOSIS — Z8542 Personal history of malignant neoplasm of other parts of uterus: Secondary | ICD-10-CM

## 2021-06-20 DIAGNOSIS — Z8669 Personal history of other diseases of the nervous system and sense organs: Secondary | ICD-10-CM

## 2021-06-20 DIAGNOSIS — K200 Eosinophilic esophagitis: Secondary | ICD-10-CM

## 2021-06-20 DIAGNOSIS — M19072 Primary osteoarthritis, left ankle and foot: Secondary | ICD-10-CM

## 2021-06-20 DIAGNOSIS — M545 Low back pain, unspecified: Secondary | ICD-10-CM | POA: Diagnosis not present

## 2021-06-20 DIAGNOSIS — M19041 Primary osteoarthritis, right hand: Secondary | ICD-10-CM | POA: Diagnosis not present

## 2021-06-20 DIAGNOSIS — E559 Vitamin D deficiency, unspecified: Secondary | ICD-10-CM

## 2021-06-20 DIAGNOSIS — M7062 Trochanteric bursitis, left hip: Secondary | ICD-10-CM

## 2021-06-20 MED ORDER — COLCHICINE 0.6 MG PO CAPS: 1.0000 | ORAL_CAPSULE | Freq: Every day | ORAL | 0 refills | Status: DC

## 2021-06-20 NOTE — Progress Notes (Signed)
Pharmacy Note  Subjective:  Patient presents today to [facility] for follow up office visit.   Patient was seen by the pharmacist for counseling on Prolia. She states her sister was previously taking bisphosphonate (oral option but unsure if which oral treatment) and had loss of teeth from this.  Objective: CMP     Component Value Date/Time   NA 141 05/18/2021 1502   NA 144 07/06/2019 1524   K 4.6 05/18/2021 1502   CL 105 05/18/2021 1502   CO2 28 05/18/2021 1502   GLUCOSE 68 (L) 05/18/2021 1502   BUN 11 05/18/2021 1502   BUN 22 07/06/2019 1524   CREATININE 0.88 05/18/2021 1502   CREATININE 0.94 10/11/2020 1156   CALCIUM 9.0 05/18/2021 1502   CALCIUM 8.9 01/30/2013 1017   PROT 6.3 05/18/2021 1502   ALBUMIN 3.6 05/18/2021 1502   AST 28 05/18/2021 1502   AST 25 02/29/2020 1303   ALT 87 (H) 05/18/2021 1502   ALT 37 02/29/2020 1303   ALKPHOS 154 (H) 05/18/2021 1502   BILITOT 0.5 05/18/2021 1502   BILITOT 0.4 02/29/2020 1303   GFRNONAA >60 05/13/2021 0645   GFRNONAA 63 08/16/2020 1207   GFRAA 73 08/16/2020 1207    Vitamin D Lab Results  Component Value Date   VD25OH 62.93 07/04/2018    DEXA updated on 01/11/21: Right femoral neck- T-score -2.5 and a -25.9% change in BMD from previous DEXA.   Assessment/Plan:  Counseled patient on purpose, proper use, and adverse effects of Prolia.  Counseled patient that Prolia is a medication that must be injected every 6 months by a healthcare professional.  Advised patient to take calcium 1200 mg daily and vitamin D 800 units daily.  Reviewed the most common adverse effects of Prolia including risk of infection, osteonecrosis of the jaw, rash, and muscle/bone pain.  Patient confirms she does not have any major dental work planned at this time.  Reviewed with patient the signs/symptoms of low calcium and advised patient to alert [place] if she experiences these symptoms.  Provided patient with medication education material and answered  all questions.   She was also provided information for bisphosphonates and Reclast should she choose to pursue yearly Reclast infusion instead. We reviewed that Reclast is for max of 5 years of therapy and she would have to transition off of Reclast once therapy is completed.  At this time, she would like to hold off on starting Prolia until she sees her dentist. Her next appt is currently scheduled in October 2022 but she will plan to r/s this appointment to be earlier if possible. She will reach back out to the clinic once she makes decision to pursue treatment  Knox Saliva, PharmD, MPH, BCPS Clinical Pharmacist (Rheumatology and Pulmonology)

## 2021-06-21 ENCOUNTER — Telehealth: Payer: Self-pay | Admitting: Orthopaedic Surgery

## 2021-06-21 NOTE — Telephone Encounter (Signed)
Pt calling stating her entire right arm is in a lot of pain. Pt does have an MRI sch for Saturday and wanted to ask if she can add in an order to get her shoulder looked at as well. Offered the pt an appt but she said she would rather just ask if it can be added since she already has an MRI sch. The best call back number is [phone].

## 2021-06-21 NOTE — Telephone Encounter (Signed)
Sure - that's fine

## 2021-06-22 ENCOUNTER — Other Ambulatory Visit: Payer: Self-pay

## 2021-06-22 DIAGNOSIS — G8929 Other chronic pain: Secondary | ICD-10-CM

## 2021-06-22 NOTE — Telephone Encounter (Signed)
Called Patient no answer LMOM. Need to know which shoulder in order to add MRI.

## 2021-06-22 NOTE — Telephone Encounter (Signed)
Pt states that its the right shoulder. She said the its shooting pains down her arm.

## 2021-06-23 NOTE — Telephone Encounter (Signed)
Secure messaged Taron M with [facility] to see if pt can get scheduled same time as her MRI wrist.

## 2021-06-24 ENCOUNTER — Ambulatory Visit
Admission: RE | Admit: 2021-06-24 | Discharge: 2021-06-24 | Disposition: A | Discharge status: Home / Self Care | Payer: Medicare Other | Source: Ambulatory Visit | Attending: Orthopaedic Surgery | Admitting: Orthopaedic Surgery

## 2021-06-24 ENCOUNTER — Other Ambulatory Visit: Payer: Self-pay

## 2021-06-24 ENCOUNTER — Other Ambulatory Visit: Payer: Self-pay | Admitting: Endocrinology

## 2021-06-24 DIAGNOSIS — M25531 Pain in right wrist: Secondary | ICD-10-CM

## 2021-06-24 DIAGNOSIS — G8929 Other chronic pain: Secondary | ICD-10-CM

## 2021-06-24 DIAGNOSIS — M25511 Pain in right shoulder: Secondary | ICD-10-CM | POA: Diagnosis not present

## 2021-06-27 ENCOUNTER — Telehealth: Payer: Self-pay | Admitting: Orthopaedic Surgery

## 2021-06-27 NOTE — Telephone Encounter (Signed)
Received call from patient wanting to know if we received records from [facility]. I advised nothing in chart from that office. She stated she will check with them again.

## 2021-06-28 ENCOUNTER — Ambulatory Visit (INDEPENDENT_AMBULATORY_CARE_PROVIDER_SITE_OTHER): Payer: Medicare Other | Admitting: Neurology

## 2021-06-28 ENCOUNTER — Other Ambulatory Visit: Payer: Self-pay

## 2021-06-28 ENCOUNTER — Encounter: Payer: Self-pay | Admitting: Neurology

## 2021-06-28 VITALS — BP 117/68 | HR 73 | Ht 71.0 in | Wt 160.0 lb

## 2021-06-28 DIAGNOSIS — R200 Anesthesia of skin: Secondary | ICD-10-CM

## 2021-06-28 DIAGNOSIS — M6281 Muscle weakness (generalized): Secondary | ICD-10-CM

## 2021-06-28 DIAGNOSIS — M5416 Radiculopathy, lumbar region: Secondary | ICD-10-CM

## 2021-06-28 DIAGNOSIS — G6289 Other specified polyneuropathies: Secondary | ICD-10-CM | POA: Diagnosis not present

## 2021-06-28 DIAGNOSIS — R202 Paresthesia of skin: Secondary | ICD-10-CM

## 2021-06-28 DIAGNOSIS — E531 Pyridoxine deficiency: Secondary | ICD-10-CM

## 2021-06-28 DIAGNOSIS — R2689 Other abnormalities of gait and mobility: Secondary | ICD-10-CM

## 2021-06-28 DIAGNOSIS — R269 Unspecified abnormalities of gait and mobility: Secondary | ICD-10-CM

## 2021-06-28 DIAGNOSIS — M79601 Pain in right arm: Secondary | ICD-10-CM | POA: Diagnosis not present

## 2021-06-28 DIAGNOSIS — M25531 Pain in right wrist: Secondary | ICD-10-CM

## 2021-06-28 DIAGNOSIS — M545 Low back pain, unspecified: Secondary | ICD-10-CM | POA: Diagnosis not present

## 2021-06-28 DIAGNOSIS — E538 Deficiency of other specified B group vitamins: Secondary | ICD-10-CM

## 2021-06-28 DIAGNOSIS — R29898 Other symptoms and signs involving the musculoskeletal system: Secondary | ICD-10-CM

## 2021-06-28 DIAGNOSIS — G8929 Other chronic pain: Secondary | ICD-10-CM

## 2021-06-28 DIAGNOSIS — I2510 Atherosclerotic heart disease of native coronary artery without angina pectoris: Secondary | ICD-10-CM

## 2021-06-28 NOTE — Progress Notes (Signed)
[facility]    Provider:  Dr Jaynee Eagles Requesting Provider: Leandrew Koyanagi, MD Primary Care Provider:  Vivi Barrack, MD  CC:  multiple neurologic concerns  HPI:  Savannah [name] is a 65 y.o. female here as requested by Leandrew Koyanagi, MD for multiple neurologic concerns. PMHx bariatric surgery, uterine cancer, coronary artery disease, diabetes, depression, headache, hepatomegaly, hyperlipidemia, hypertension, osteoarthritis, chronic nonalcoholic liver disease, peripheral neuropathy, is seeing Ortho for wrist pain, sees rheumatology diagnosed with pseudogout, primary osteoarthritis, trochanteric bursitis, age-related osteoporosis.  She says she is here for multiple items. She says she has neck pain and radiating pain down the right arm. She thinks she has a pinched nerve in her neck. Dr. Erlinda Hong has been treating her, she got injections in the wrists, helped the left and not the right. She has been wearing the wrist brace since April/may. She has shoulder pain. She also has a lot of neck pain. She has been having this for years and has been under the care of [facility]. Starts in the neck and shoulder and radiates down to the first 4 fingers. At first it was just the wrist, now also the arm. She has no strength in her right hand, she can't twist her arm. Both feet have neuropathy. Rubbing them helps. Voltaren gel helps. She is on gabapentin. Left one is always swollen after surgery. But both feet are affected. Can go up to ankles. Feels like something is hot on her leg radiating down. Can be symmetric. Can have sharp burning pain. Numbness and weakness in the legs with difficulty walking.  Poor balance. No other focal neurologic deficits, associated symptoms, inciting events or modifiable factors.   Reviewed notes, labs and imaging from outside physicians, which showed:  Hgba1c 7.0  Personally reviewed imaging and agree with the following:  CT Head 12/16/2020 IMPRESSION: 1. No acute  intracranial pathology. 2. Mild chronic microvascular ischemic changes.  I reviewed Dr. Phoebe Sharps (ortho) last notes from Ortho care, seeing her for chronic right wrist pain, last seen July 2022, chronic right wrist pain with underlying DRUJ and carpal tunnel syndrome.  Due to the patient's chronic symptoms not alleviated with conservative treatment, we will order an MRI to further assess for structural abnormalities.  In regards to the carpal tunnel syndrome she will tried and obtain records of the nerve conduction study did discuss at her follow-up appointment.  MRI of the wrist on the right without contrast was ordered.  I also reviewed: Rheumatology notes: She was referred for inflammatory arthritis, last seen June 20, 2021: Has been seeing the pharmacist for counseling on Prolia, also so Dr. Estanislado Pandy in rheumatology  in July 2022 likely inflammatory arthritis RF negative, anti-CCP negative, 14 3 3  ETA negative, ANA negative HLA-B27 negative, uric acid 3.8, responsive to colchicine likely she has pseudogout.  I reviewed MRI of the wrist and shoulder studies June 24, 2021:  MRI Wrist: 1. Severe tenosynovitis of the flexor digitorum tendon sheath just beyond the carpal tunnel. 2. Severe degeneration and thinning of the TFCC with a full-thickness tear of the body. 3. Severe osteoarthritis of the first [facility] joint. 4. Moderate osteoarthritis of the radiocarpal joint. 5. Mild osteoarthritis of the pisotriquetral joint. 6. Synovitis of the midcarpal joint. 7. Median nerve demonstrates normal signal but appears flattened within the carpal tunnel (image 9/series 3).  MRI Shoulder  IMPRESSION: 1. Mild tendinosis of the supraspinatus tendon with fraying along the anterior bursal surface. 2. Mild tendinosis of the infraspinatus  tendon.  Review of Systems: Patient complains of symptoms per HPI as well as the following symptoms fatigue, swelling in legs, dryness, joint pain. Pertinent negatives and  positives per HPI. All others negative.   Social History   Socioeconomic History   Marital status: Single    Spouse name: Not on file   Number of children: 0   Years of education: 16   Highest education level: Bachelor's degree (e.g., BA, AB, BS)  Occupational History   Occupation: Optometrist   Occupation: Retired  Tobacco Use   Smoking status: Former    Packs/day: 1.50    Years: 15.00    Pack years: 22.50    Types: Cigarettes    Quit date: 1992    Years since quitting: 30.6   Smokeless tobacco: Never  Vaping Use   Vaping Use: Never used  Substance and Sexual Activity   Alcohol use: Yes    Comment: rarely   Drug use: Never   Sexual activity: Not Currently  Other Topics Concern   Not on file  Social History Narrative   Not on file   Social Determinants of Health   Financial Resource Strain: Not on file  Food Insecurity: Not on file  Transportation Needs: Not on file  Physical Activity: Not on file  Stress: Not on file  Social Connections: Not on file  Intimate Partner Violence: Not on file    Family History  Problem Relation Age of Onset   Diabetes Mother    Neuropathy Mother    Diabetes Father    Congestive Heart Failure Father    Hypertension Father    Myelodysplastic syndrome Father    Bladder Cancer Sister    Multiple sclerosis Sister    Breast cancer Sister        breast onset age 33   Osteoporosis Sister    Liver cancer Maternal Aunt    Breast cancer Maternal Aunt    Diabetes Maternal Aunt    Colon cancer Maternal Uncle    Diabetes Maternal Uncle    Neuropathy Paternal Aunt    Diabetes Maternal Grandmother    Diabetes Paternal Grandmother     Past Medical History:  Diagnosis Date   ABDOMINAL PAIN, CHRONIC 10/20/2009   Acute cystitis 08/17/2009   ALLERGIC RHINITIS 11/21/2007   Allergy    ASYMPTOMATIC POSTMENOPAUSAL STATUS 09/29/2008   Bariatric surgery status 07/07/2010   Bariatric surgery status 07/07/2010   Qualifier: Diagnosis of   By: Loanne Drilling MD, Sean A    Cancer [facility])    uterine   Cataract    Coronary artery disease    DEPRESSION 11/21/2007   DIABETES MELLITUS, TYPE II 07/08/2007   DYSPNEA 05/20/2009   Edema 62/70/3500   Eosinophilic esophagitis 93/81/8299   FEVER UNSPECIFIED 10/20/2009   FEVER, HX OF 03/09/2010   GERD (gastroesophageal reflux disease)    Headache(784.0) 02/06/2010   Hepatomegaly 01/06/2008   HYPERLIPIDEMIA 07/08/2007   HYPERTENSION 07/08/2007   LEUKOPENIA, MILD 09/29/2008   OBESITY 01/06/2008   OSTEOARTHRITIS 01/06/2008   Other chronic nonalcoholic liver disease 37/16/9678   PERIPHERAL NEUROPATHY 11/21/2007   Peripheral neuropathy    Postsurgical hypothyroidism 09/19/2010   SINUSITIS- ACUTE-NOS 11/21/2007   TB SKIN TEST, POSITIVE 02/06/2010   THYROID NODULE 03/09/2010    Patient Active Problem List   Diagnosis Date Noted   Osteopenia 01/14/2021   Sesamoiditis of left foot 11/09/2020   Posterior tibial tendinitis of left lower extremity 11/09/2020   Type 2 diabetes mellitus with diabetic peripheral angiopathy without  gangrene, with long-term current use of insulin ([place]) 10/21/2020   Iron deficiency anemia 03/14/2020   Deficiency anemia 02/29/2020   Lesion of vulva 09/02/2019   Menorrhagia 09/02/2019   Neuropathy 09/02/2019   Irregular bowel habits 06/25/2019   Coronary artery disease involving native coronary artery of native heart without angina pectoris    Temporomandibular joint disorder 12/05/2018   Fatigue 12/05/2018   Inverted nipple 12/05/2018   Senile purpura ([place]) 11/21/2018   GERD with esophagitis 11/21/2018   Pseudogout involving multiple joints 11/21/2018   Lipoma 09/08/2535   Lichen sclerosus 64/40/3474   H/O gastric bypass 11/16/2016   GERD (gastroesophageal reflux disease) 11/16/2016   Depression 11/16/2016   Diabetic polyneuropathy associated with type 2 diabetes mellitus ([place]) 11/16/2016   Uterine cancer ([place]) s/p hysterectomy 1997 01/04/2012    Postsurgical hypothyroidism 09/19/2010   Diabetic neuropathy ([place]) 11/21/2007   Allergic rhinitis 11/21/2007   Dyslipidemia associated with type 2 diabetes mellitus ([place]) 07/08/2007    Past Surgical History:  Procedure Laterality Date   ABDOMINAL HYSTERECTOMY     BASAL CELL CARCINOMA EXCISION     CARDIAC CATHETERIZATION     CHOLECYSTECTOMY N/A 05/11/2021   Procedure: LAPAROSCOPIC CHOLECYSTECTOMY WITH POSSIBLE  INTRAOPERATIVE CHOLANGIOGRAM;  Surgeon: Johnathan Hausen, MD;  Location: [facility];  Service: General;  Laterality: N/A;   COLONOSCOPY     EYE SURGERY Bilateral 08/2018, 09/2018   FOOT SURGERY Left    10/2019   KNEE ARTHROSCOPY Left    LEFT HEART CATH AND CORONARY ANGIOGRAPHY N/A 01/20/2019   Procedure: LEFT HEART CATH AND CORONARY ANGIOGRAPHY;  Surgeon: Belva Crome, MD;  Location: [facility];  Service: Cardiovascular;  Laterality: N/A;   LYMPH NODE DISSECTION     OOPHORECTOMY     Right total knee replacement     ROUX-EN-Y GASTRIC BYPASS  2011   SKIN TAG REMOVAL     THYROIDECTOMY     TONSILLECTOMY AND ADENOIDECTOMY     UPPER GASTROINTESTINAL ENDOSCOPY      Current Outpatient Medications  Medication Sig Dispense Refill   acetaminophen (TYLENOL) 500 MG tablet Take 2 tablets (1,000 mg total) by mouth every 6 (six) hours as needed for mild pain or fever. 30 tablet 0   AMBULATORY NON FORMULARY MEDICATION Administrator at [employer]  Correct leg length and improve pronation BL 1 each 0   aspirin EC 81 MG tablet Take 1 tablet (81 mg total) by mouth daily. Starting 01/18/19 prior to cardiac cath     Cholecalciferol (VITAMIN D) 50 MCG (2000 UT) tablet Take 4,000 Units by mouth daily.     CLINPRO 5000 1.1 % PSTE See admin instructions.     Colchicine 0.6 MG CAPS Take 1 tablet by mouth daily. (Patient taking differently: Take 1 tablet by mouth daily. Does take another at HS if needed) 90 capsule 0   cyclobenzaprine (FLEXERIL) 10 MG tablet Take 10 mg by mouth at bedtime  as needed for muscle spasms.     desloratadine (CLARINEX) 5 MG tablet Take 5 mg by mouth daily.     diclofenac sodium (VOLTAREN) 1 % GEL Apply 4 g topically 4 (four) times daily. 100 g 11   docusate sodium (COLACE) 100 MG capsule Take 200 mg by mouth 2 (two) times daily.     Dulaglutide (TRULICITY) 3 QV/9.5GL SOPN Inject 3 mg as directed once a week. 6 mL 3   Elastic Bandages & Supports (RIB BELT/WOMENS MEDIUM) MISC Use as needed for rib pain 1 each  0   Furosemide (LASIX PO) Take by mouth. Takes PRN     gabapentin (NEURONTIN) 300 MG capsule TAKE 4 CAPSULES BY MOUTH 3 TIMES DAILY. 360 capsule 0   halobetasol (ULTRAVATE) 0.05 % cream Apply 1 application topically 2 (two) times daily as needed (lichen sclerosus).   2   INVOKANA 300 MG TABS tablet TAKE 1 TABLET (300 MG TOTAL) BY MOUTH DAILY BEFORE BREAKFAST. 90 tablet 3   levothyroxine (SYNTHROID) 112 MCG tablet TAKE 1 TABLET BY MOUTH EVERY DAY 90 tablet 2   lidocaine (LIDODERM) 5 % PLACE 1 PATCH ONTO THE SKIN DAILY. REMOVE & DISCARD PATCH WITHIN 12 HOURS OR AS DIRECTED BY MD 30 patch 0   MAGNESIUM PO Take 4 tablets by mouth daily as needed (cramps).     metFORMIN (GLUCOPHAGE-XR) 500 MG 24 hr tablet Take 4 tablets (2,000 mg total) by mouth daily. 360 tablet 3   Multiple Vitamin (MULTIVITAMIN) tablet Take 1 tablet by mouth in the morning and at bedtime.     nitroGLYCERIN (NITROSTAT) 0.4 MG SL tablet Place 1 tablet (0.4 mg total) under the tongue every 5 (five) minutes as needed. 25 tablet 3   ONETOUCH ULTRA test strip USE TO MONITOR GLUCOSE LEVELS ONCE PER DAY E11.40 100 strip 2   OXYCODONE HCL PO Take by mouth as needed.     pantoprazole (PROTONIX) 40 MG tablet TAKE 1 TABLET BY MOUTH TWICE A DAY 180 tablet 1   Probiotic CAPS Take 1 capsule by mouth daily.     repaglinide (PRANDIN) 2 MG tablet Take 1 tablet (2 mg total) by mouth 2 (two) times daily before a meal. 180 tablet 3   TRAMADOL HCL PO Take by mouth as needed.     TRULICITY 1.5 JJ/9.4RD SOPN  INJECT 1.5 MG INTO THE SKIN ONCE A WEEK. 6 mL 3   No current facility-administered medications for this visit.    Allergies as of 06/28/2021 - Review Complete 06/28/2021  Allergen Reaction Noted   Ace inhibitors Cough    Caffeine  01/14/2019   Ciprofloxacin Itching 11/16/2016   Morphine and related Itching 05/29/2019   Mucinex [guaifenesin er]  01/14/2019   Nsaids  02/03/2016   Other  40/81/4481   Silicone Hives 85/63/1497   Tape Hives 05/10/2021    Vitals: BP 117/68   Pulse 73   Ht 5' 11"  (1.803 m)   Wt 160 lb (72.6 kg)   BMI 22.32 kg/m  Last Weight:  Wt Readings from Last 1 Encounters:  06/28/21 160 lb (72.6 kg)   Last Height:   Ht Readings from Last 1 Encounters:  06/28/21 5' 11"  (1.803 m)    Physical exam: Exam: Gen: NAD, conversant, well nourised, tall and thin, well groomed                     CV: RRR, no MRG. No Carotid Bruits. No peripheral edema, warm, nontender Eyes: Conjunctivae clear without exudates or hemorrhage  Neuro: Detailed Neurologic Exam  Speech:    Speech is normal; fluent and spontaneous with normal comprehension.  Cognition:    The patient is oriented to person, place, and time;     recent and remote memory intact;     language fluent;     normal attention, concentration,     fund of knowledge Cranial Nerves:    The pupils are equal, round, and reactive to light.  Pupils are too small to visualize fundi.  Visual fields are full to finger confrontation.  Extraocular movements are intact. Trigeminal sensation is intact and the muscles of mastication are normal. The face is symmetric. The palate elevates in the midline. Hearing intact. Voice is normal. Shoulder shrug is normal. The tongue has normal motion without fasciculations.   Coordination:    Normal finger to nose   Gait:    Slightly antalgic  Motor Observation:    No asymmetry, no atrophy, and no involuntary movements noted. Tone:    Normal muscle tone.    Posture:     normal    Strength: Left delt 4-4+/5  Right deltoid 3/5 Right triceps 3+/5 Right biceps 4+/5 Right pronation and supination very weak more due to pain Interossei mild weakness right and right opponens weakness Cant make a fist right Right hip flexion 2/5 Left hip flexion 3/5 Left  leg flex/ex 4+/5 Cannot tell DF/PF left  due to swelling Right PF/DF, right leg ext/flex 5/5      Sensation: decr pin prick and temp distally in a gradient fashion     Reflex Exam:  DTR's:    Absent AJs, trace patellars, Intact uppers possibly slightly brisker right triceps.   Toes:    The toes are equiv bilaterally.   Clonus:    Clonus is absent.    Assessment/Plan:  65 y.o. female here as requested by Leandrew Koyanagi, MD for multiple neurologic concerns. PMHx bariatric surgery, uterine cancer, coronary artery disease, diabetes, depression, headache, hepatomegaly, hyperlipidemia, hypertension, osteoarthritis, chronic nonalcoholic liver disease, peripheral neuropathy, is seeing Ortho for wrist pain, sees rheumatology diagnosed with pseudogout, primary osteoarthritis, trochanteric bursitis, age-related osteoporosis.  Chronic neck pain: has been under the care of other neurologist, had emg/ncs, chronic arm and wrist pain, right hand and arm numbness and weakness, radiuclopathy, imbalance,: MRI cervical spine for cervical radiculopathy and evaluation for myelopathy  Right wrist pain: See MRI wrist, multiple findings   Requesting all records from [facility] neurology including emg/ncs of which she says she has had several  Polyneuropathy/sensory changes in the feet: Most likely cause is diabetes. But will check multiple other labs today for any other causes  Chronic low back pain: midline. Reports poor balance, numbness and weakness in the legs, feels her whole leg is hot, will order MRI lumbar spine to look for radiculopathy.    Orders Placed This Encounter  Procedures   MR CERVICAL SPINE WO CONTRAST   MR  LUMBAR SPINE WO CONTRAST   B12 and Folate Panel   Methylmalonic acid, serum   Vitamin B6   Vitamin B1   Sedimentation rate   Sjogren's syndrome antibods(ssa + ssb)   Rheumatoid factor   Heavy metals, blood   Multiple Myeloma Panel (SPEP&IFE w/QIG)   Pan-ANCA   Cyclic citrul peptide antibody, IgG   ANA Comprehensive Panel   ANA, IFA (with reflex)   ANA   CK   CBC with Differential/Platelets   Comprehensive metabolic panel   No orders of the defined types were placed in this encounter.   Cc: Leandrew Koyanagi, MD,  Vivi Barrack, MD  Sarina Ill, MD  [facility] [address] [place], [place] [address]  Phone [phone] Fax [phone]  I spent over 110 minutes of face-to-face and non-face-to-face time with patient on the  1. Other polyneuropathy   2. Numbness and tingling in right hand   3. Numbness and tingling of both legs   4. Muscle weakness   5. Right arm weakness   6. Right arm pain   7.  Right wrist pain   8. Gait abnormality   9. Imbalance   10. Weakness of both lower extremities   11. Numbness and tingling of both feet   12. Chronic bilateral low back pain, unspecified whether sciatica present   13. Lumbar radiculopathy   14. B12 deficiency   15. Vitamin B6 deficiency    diagnosis.  This included previsit chart review, lab review, study review, order entry, electronic health record documentation, patient education on the different diagnostic and therapeutic options, counseling and coordination of care, risks and benefits of management, compliance, or risk factor reduction

## 2021-06-28 NOTE — Patient Instructions (Signed)
MRI cervical spine and lumbar spine Get EMG records from [facility] and might repeat Extensive blood work for peripheral neuropathy  Leory Plowman and Daroff's neurology in clinical practice (8th ed., pp. 2671- 1929). Elsevier."> Goldman-Cecil medicine (26th ed., pp. 2489- 2501). Elsevier.">  Peripheral Neuropathy Peripheral neuropathy is a type of nerve damage. It affects nerves that carry signals between the spinal cord and the arms, legs, and the rest of the body (peripheral nerves). It does not affect nerves in the spinal cord or brain. In peripheral neuropathy, one nerve or a group of nerves may be damaged. Peripheral neuropathy is a broad category that includes many specific nerve disorders,like diabetic neuropathy, hereditary neuropathy, and carpal tunnel syndrome. What are the causes? This condition may be caused by: Diabetes. This is the most common cause of peripheral neuropathy. Nerve injury. Pressure or stress on a nerve that lasts a long time. Lack (deficiency) of B vitamins. This can result from alcoholism, poor diet, or a restricted diet. Infections. Autoimmune diseases, such as rheumatoid arthritis and systemic lupus erythematosus. Nerve diseases that are passed from parent to child (inherited). Some medicines, such as cancer medicines (chemotherapy). Poisonous (toxic) substances, such as lead and mercury. Too little blood flowing to the legs. Kidney disease. Thyroid disease. In some cases, the cause of this condition is not known. What are the signs or symptoms? Symptoms of this condition depend on which of your nerves is damaged. Common symptoms include: Loss of feeling (numbness) in the feet, hands, or both. Tingling in the feet, hands, or both. Burning pain. Very sensitive skin. Weakness. Not being able to move a part of the body (paralysis). Muscle twitching. Clumsiness or poor coordination. Loss of balance. Not being able to control your bladder. Feeling  dizzy. Sexual problems. How is this diagnosed? Diagnosing and finding the cause of peripheral neuropathy can be difficult. Your health care provider will take your medical history and do a physical exam. A neurological exam will also be done. This involves checking things that are affected by your brain, spinal cord, and nerves (nervous system). For example, your health care provider will check your reflexes, how youmove, and what you can feel. You may have other tests, such as: Blood tests. Electromyogram (EMG) and nerve conduction tests. These tests check nerve function and how well the nerves are controlling the muscles. Imaging tests, such as CT scans or MRI to rule out other causes of your symptoms. Removing a small piece of nerve to be examined in a lab (nerve biopsy). Removing and examining a small amount of the fluid that surrounds the brain and spinal cord (lumbar puncture). How is this treated? Treatment for this condition may involve: Treating the underlying cause of the neuropathy, such as diabetes, kidney disease, or vitamin deficiencies. Stopping medicines that can cause neuropathy, such as chemotherapy. Medicine to help relieve pain. Medicines may include: Prescription or over-the-counter pain medicine. Antiseizure medicine. Antidepressants. Pain-relieving patches that are applied to painful areas of skin. Surgery to relieve pressure on a nerve or to destroy a nerve that is causing pain. Physical therapy to help improve movement and balance. Devices to help you move around (assistive devices). Follow these instructions at home: Medicines Take over-the-counter and prescription medicines only as told by your health care provider. Do not take any other medicines without first asking your health care provider. Do not drive or use heavy machinery while taking prescription pain medicine. Lifestyle  Do not use any products that contain nicotine or tobacco, such as cigarettes  and  e-cigarettes. Smoking keeps blood from reaching damaged nerves. If you need help quitting, ask your health care provider. Avoid or limit alcohol. Too much alcohol can cause a vitamin B deficiency, and vitamin B is needed for healthy nerves. Eat a healthy diet. This includes: Eating foods that are high in fiber, such as fresh fruits and vegetables, whole grains, and beans. Limiting foods that are high in fat and processed sugars, such as fried or sweet foods.  General instructions  If you have diabetes, work closely with your health care provider to keep your blood sugar under control. If you have numbness in your feet: Check every day for signs of injury or infection. Watch for redness, warmth, and swelling. Wear padded socks and comfortable shoes. These help protect your feet. Develop a good support system. Living with peripheral neuropathy can be stressful. Consider talking with a mental health specialist or joining a support group. Use assistive devices and attend physical therapy as told by your health care provider. This may include using a walker or a cane. Keep all follow-up visits as told by your health care provider. This is important.  Contact a health care provider if: You have new signs or symptoms of peripheral neuropathy. You are struggling emotionally from dealing with peripheral neuropathy. Your pain is not well-controlled. Get help right away if: You have an injury or infection that is not healing normally. You develop new weakness in an arm or leg. You have fallen or do so frequently. Summary Peripheral neuropathy is when the nerves in the arms, or legs are damaged, resulting in numbness, weakness, or pain. There are many causes of peripheral neuropathy, including diabetes, pinched nerves, vitamin deficiencies, autoimmune disease, and hereditary conditions. Diagnosing and finding the cause of peripheral neuropathy can be difficult. Your health care provider will take your  medical history, do a physical exam, and do tests, including blood tests and nerve function tests. Treatment involves treating the underlying cause of the neuropathy and taking medicines to help control pain. Physical therapy and assistive devices may also help. This information is not intended to replace advice given to you by your health care provider. Make sure you discuss any questions you have with your healthcare provider. Document Revised: 08/09/2020 Document Reviewed: 08/09/2020 Elsevier Patient Education  2022 [employer].

## 2021-07-03 ENCOUNTER — Telehealth: Payer: Self-pay | Admitting: *Deleted

## 2021-07-03 ENCOUNTER — Encounter: Payer: Self-pay | Admitting: Neurology

## 2021-07-03 NOTE — Telephone Encounter (Signed)
Request faxed to [facility] [phone].

## 2021-07-06 ENCOUNTER — Encounter: Payer: Self-pay | Admitting: Orthopaedic Surgery

## 2021-07-06 ENCOUNTER — Other Ambulatory Visit: Payer: Self-pay

## 2021-07-06 ENCOUNTER — Ambulatory Visit (INDEPENDENT_AMBULATORY_CARE_PROVIDER_SITE_OTHER): Payer: Medicare Other | Admitting: Orthopaedic Surgery

## 2021-07-06 DIAGNOSIS — I2510 Atherosclerotic heart disease of native coronary artery without angina pectoris: Secondary | ICD-10-CM | POA: Diagnosis not present

## 2021-07-06 DIAGNOSIS — M19131 Post-traumatic osteoarthritis, right wrist: Secondary | ICD-10-CM

## 2021-07-06 DIAGNOSIS — G5602 Carpal tunnel syndrome, left upper limb: Secondary | ICD-10-CM | POA: Diagnosis not present

## 2021-07-06 LAB — MULTIPLE MYELOMA PANEL, SERUM
Albumin SerPl Elph-Mcnc: 3.6 g/dL (ref 2.9–4.4)
Albumin/Glob SerPl: 1.6 (ref 0.7–1.7)
Alpha 1: 0.2 g/dL (ref 0.0–0.4)
Alpha2 Glob SerPl Elph-Mcnc: 0.7 g/dL (ref 0.4–1.0)
B-Globulin SerPl Elph-Mcnc: 0.8 g/dL (ref 0.7–1.3)
Gamma Glob SerPl Elph-Mcnc: 0.5 g/dL (ref 0.4–1.8)
Globulin, Total: 2.3 g/dL (ref 2.2–3.9)
IgA/Immunoglobulin A, Serum: 150 mg/dL (ref 87–352)
IgG (Immunoglobin G), Serum: 522 mg/dL — ABNORMAL LOW (ref 586–1602)
IgM (Immunoglobulin M), Srm: 59 mg/dL (ref 26–217)

## 2021-07-06 LAB — ANA COMPREHENSIVE PANEL
Anti JO-1: <0.2 AI (ref 0.0–0.9)
Centromere Ab Screen: <0.2 AI (ref 0.0–0.9)
Chromatin Ab SerPl-aCnc: <0.2 AI (ref 0.0–0.9)
ENA RNP Ab: <0.2 AI (ref 0.0–0.9)
ENA SM Ab Ser-aCnc: <0.2 AI (ref 0.0–0.9)
ENA SSA (RO) Ab: <0.2 AI (ref 0.0–0.9)
ENA SSB (LA) Ab: <0.2 AI (ref 0.0–0.9)
Scleroderma (Scl-70) (ENA) Antibody, IgG: <0.2 AI (ref 0.0–0.9)
dsDNA Ab: <1 IU/mL (ref 0–9)

## 2021-07-06 LAB — B12 AND FOLATE PANEL
Folate: >20 ng/mL (ref >3.0)
Vitamin B-12: 919 pg/mL (ref 232–1245)

## 2021-07-06 LAB — CBC WITH DIFFERENTIAL/PLATELET
Basophils Absolute: 0 10*3/uL (ref 0.0–0.2)
Basos: 1 %
EOS (ABSOLUTE): 0.3 10*3/uL (ref 0.0–0.4)
Eos: 8 %
Hematocrit: 36.7 % (ref 34.0–46.6)
Hemoglobin: 12.3 g/dL (ref 11.1–15.9)
Immature Grans (Abs): 0 10*3/uL (ref 0.0–0.1)
Immature Granulocytes: 0 %
Lymphocytes Absolute: 1.2 10*3/uL (ref 0.7–3.1)
Lymphs: 37 %
MCH: 31.9 pg (ref 26.6–33.0)
MCHC: 33.5 g/dL (ref 31.5–35.7)
MCV: 95 fL (ref 79–97)
Monocytes Absolute: 0.3 10*3/uL (ref 0.1–0.9)
Monocytes: 9 %
Neutrophils Absolute: 1.5 10*3/uL (ref 1.4–7.0)
Neutrophils: 45 %
Platelets: 175 10*3/uL (ref 150–450)
RBC: 3.85 x10E6/uL (ref 3.77–5.28)
RDW: 13.2 % (ref 11.7–15.4)
WBC: 3.3 10*3/uL — ABNORMAL LOW (ref 3.4–10.8)

## 2021-07-06 LAB — VITAMIN B1: Thiamine: 162.7 nmol/L (ref 66.5–200.0)

## 2021-07-06 LAB — COMPREHENSIVE METABOLIC PANEL
ALT: 87 IU/L — ABNORMAL HIGH (ref 0–32)
AST: 65 IU/L — ABNORMAL HIGH (ref 0–40)
Albumin/Globulin Ratio: 2.1 (ref 1.2–2.2)
Albumin: 4 g/dL (ref 3.8–4.8)
Alkaline Phosphatase: 120 IU/L (ref 44–121)
BUN/Creatinine Ratio: 12 (ref 12–28)
BUN: 12 mg/dL (ref 8–27)
Bilirubin Total: 0.3 mg/dL (ref 0.0–1.2)
CO2: 26 mmol/L (ref 20–29)
Calcium: 8.8 mg/dL (ref 8.7–10.3)
Chloride: 104 mmol/L (ref 96–106)
Creatinine, Ser: 1 mg/dL (ref 0.57–1.00)
Globulin, Total: 1.9 g/dL (ref 1.5–4.5)
Glucose: 206 mg/dL — ABNORMAL HIGH (ref 65–99)
Potassium: 4.6 mmol/L (ref 3.5–5.2)
Sodium: 144 mmol/L (ref 134–144)
Total Protein: 5.9 g/dL — ABNORMAL LOW (ref 6.0–8.5)
eGFR: 62 mL/min/{1.73_m2} (ref >59)

## 2021-07-06 LAB — CK: Total CK: 50 U/L (ref 32–182)

## 2021-07-06 LAB — HEAVY METALS, BLOOD
Arsenic: 2 ug/L (ref 0–9)
Lead, Blood: <1 ug/dL (ref 0–4)
Mercury: 1 ug/L (ref 0.0–14.9)

## 2021-07-06 LAB — METHYLMALONIC ACID, SERUM: Methylmalonic Acid: 250 nmol/L (ref 0–378)

## 2021-07-06 LAB — RHEUMATOID FACTOR: Rheumatoid fact SerPl-aCnc: <10 IU/mL (ref <14.0)

## 2021-07-06 LAB — SEDIMENTATION RATE: Sed Rate: 17 mm/hr (ref 0–40)

## 2021-07-06 LAB — ANA: ANA Titer 1: NEGATIVE

## 2021-07-06 LAB — VITAMIN B6: Vitamin B6: 22.4 ug/L (ref 3.4–65.2)

## 2021-07-06 NOTE — Progress Notes (Signed)
Office Visit Note   Patient: Savannah [name]           Date of Birth: 08-22-1956           MRN: [id number] Visit Date: 07/06/2021              Requested by: Savannah Barrack, MD [address] [place],  [place] [address] PCP: Savannah Barrack, MD   Assessment & Plan: Visit Diagnoses:  1. Left carpal tunnel syndrome   2. Post-traumatic osteoarthritis of right wrist     Plan: In terms of the right shoulder she has mild tendinosis but no real findings other than some age-appropriate wear and tear.  I do not believe this would explain her symptoms.  However the right wrist does show significant tenosynovitis and degenerative changes of the radiocarpal as well as the distal radial ulnar joint and CMC arthritis.  The nerve conduction studies from Savannah [name] shows that she has moderate left carpal tunnel syndrome.  These findings were all reviewed with the patient in detail and I have recommended referral to Savannah [name] for further evaluation and possible surgical treatment for the left wrist.  She will make that appointment today.  Follow-Up Instructions: Return for needs follow up appt with Savannah [name] for evaluation of right wrist and hand.   Orders:  No orders of the defined types were placed in this encounter.  No orders of the defined types were placed in this encounter.     Procedures: No procedures performed   Clinical Data: No additional findings.   Subjective: Chief Complaint  Patient presents with   Right Hand - Pain   Left Hand - Pain    Savannah [name] comes back for MRI review of the right wrist and right shoulder.  She has also brought in copies of her nerve conduction studies from [name].   Review of Systems   Objective: Vital Signs: There were no vitals taken for this visit.  Physical Exam  Ortho Exam Shoulder wrist exams are unchanged. Specialty Comments:  No specialty comments available.  Imaging: No results found.   PMFS History: Patient  Active Problem List   Diagnosis Date Noted   Osteopenia 01/14/2021   Sesamoiditis of left foot 11/09/2020   Posterior tibial tendinitis of left lower extremity 11/09/2020   Type 2 diabetes mellitus with diabetic peripheral angiopathy without gangrene, with long-term current use of insulin ([place]) 10/21/2020   Iron deficiency anemia 03/14/2020   Deficiency anemia 02/29/2020   Lesion of vulva 09/02/2019   Menorrhagia 09/02/2019   Neuropathy 09/02/2019   Irregular bowel habits 06/25/2019   Coronary artery disease involving native coronary artery of native heart without angina pectoris    Temporomandibular joint disorder 12/05/2018   Fatigue 12/05/2018   Inverted nipple 12/05/2018   Senile purpura ([place]) 11/21/2018   GERD with esophagitis 11/21/2018   Pseudogout involving multiple joints 11/21/2018   Lipoma 15/03/6978   Lichen sclerosus 48/11/6551   H/O gastric bypass 11/16/2016   GERD (gastroesophageal reflux disease) 11/16/2016   Depression 11/16/2016   Diabetic polyneuropathy associated with type 2 diabetes mellitus ([place]) 11/16/2016   Uterine cancer ([place]) s/p hysterectomy 1997 01/04/2012   Postsurgical hypothyroidism 09/19/2010   Diabetic neuropathy ([place]) 11/21/2007   Allergic rhinitis 11/21/2007   Dyslipidemia associated with type 2 diabetes mellitus ([place]) 07/08/2007   Past Medical History:  Diagnosis Date   ABDOMINAL PAIN, CHRONIC 10/20/2009   Acute cystitis 08/17/2009   ALLERGIC RHINITIS 11/21/2007   Allergy  ASYMPTOMATIC POSTMENOPAUSAL STATUS 09/29/2008   Bariatric surgery status 07/07/2010   Bariatric surgery status 07/07/2010   Qualifier: Diagnosis of  By: Savannah Drilling MD, Savannah [name]    Cancer [facility])    uterine   Cataract    Coronary artery disease    DEPRESSION 11/21/2007   DIABETES MELLITUS, TYPE II 07/08/2007   DYSPNEA 05/20/2009   Edema 88/87/5797   Eosinophilic esophagitis 28/20/6015   FEVER UNSPECIFIED 10/20/2009   FEVER, HX OF 03/09/2010   GERD (gastroesophageal  reflux disease)    Headache(784.0) 02/06/2010   Hepatomegaly 01/06/2008   HYPERLIPIDEMIA 07/08/2007   HYPERTENSION 07/08/2007   LEUKOPENIA, MILD 09/29/2008   OBESITY 01/06/2008   OSTEOARTHRITIS 01/06/2008   Other chronic nonalcoholic liver disease 61/53/7943   PERIPHERAL NEUROPATHY 11/21/2007   Peripheral neuropathy    Postsurgical hypothyroidism 09/19/2010   SINUSITIS- ACUTE-NOS 11/21/2007   TB SKIN TEST, POSITIVE 02/06/2010   THYROID NODULE 03/09/2010    Family History  Problem Relation Age of Onset   Diabetes Mother    Neuropathy Mother    Diabetes Father    Congestive Heart Failure Father    Hypertension Father    Myelodysplastic syndrome Father    Bladder Cancer Sister    Multiple sclerosis Sister    Breast cancer Sister        breast onset age 24   Osteoporosis Sister    Liver cancer Maternal Aunt    Breast cancer Maternal Aunt    Diabetes Maternal Aunt    Colon cancer Maternal Uncle    Diabetes Maternal Uncle    Neuropathy Paternal Aunt    Diabetes Maternal Grandmother    Diabetes Paternal Grandmother     Past Surgical History:  Procedure Laterality Date   ABDOMINAL HYSTERECTOMY     BASAL CELL CARCINOMA EXCISION     CARDIAC CATHETERIZATION     CHOLECYSTECTOMY N/[name] 05/11/2021   Procedure: LAPAROSCOPIC CHOLECYSTECTOMY WITH POSSIBLE  INTRAOPERATIVE CHOLANGIOGRAM;  Surgeon: Savannah Hausen, MD;  Location: [facility];  Service: General;  Laterality: N/[name];   COLONOSCOPY     EYE SURGERY Bilateral 08/2018, 09/2018   FOOT SURGERY Left    10/2019   KNEE ARTHROSCOPY Left    LEFT HEART CATH AND CORONARY ANGIOGRAPHY N/[name] 01/20/2019   Procedure: LEFT HEART CATH AND CORONARY ANGIOGRAPHY;  Surgeon: Savannah Crome, MD;  Location: [facility];  Service: Cardiovascular;  Laterality: N/[name];   LYMPH NODE DISSECTION     OOPHORECTOMY     Right total knee replacement     ROUX-EN-Y GASTRIC BYPASS  2011   SKIN TAG REMOVAL     THYROIDECTOMY     TONSILLECTOMY AND ADENOIDECTOMY      UPPER GASTROINTESTINAL ENDOSCOPY     Social History   Occupational History   Occupation: Optometrist   Occupation: Retired  Tobacco Use   Smoking status: Former    Packs/day: 1.50    Years: 15.00    Pack years: 22.50    Types: Cigarettes    Quit date: 1992    Years since quitting: 30.6   Smokeless tobacco: Never  Vaping Use   Vaping Use: Never used  Substance and Sexual Activity   Alcohol use: Yes    Comment: rarely   Drug use: Never   Sexual activity: Not Currently

## 2021-07-07 ENCOUNTER — Ambulatory Visit
Admission: RE | Admit: 2021-07-07 | Discharge: 2021-07-07 | Disposition: A | Discharge status: Home / Self Care | Payer: Medicare Other | Source: Ambulatory Visit | Attending: Neurology | Admitting: Neurology

## 2021-07-07 ENCOUNTER — Encounter: Payer: Self-pay | Admitting: Internal Medicine

## 2021-07-07 DIAGNOSIS — M6281 Muscle weakness (generalized): Secondary | ICD-10-CM | POA: Diagnosis not present

## 2021-07-07 DIAGNOSIS — R269 Unspecified abnormalities of gait and mobility: Secondary | ICD-10-CM

## 2021-07-07 DIAGNOSIS — R29898 Other symptoms and signs involving the musculoskeletal system: Secondary | ICD-10-CM

## 2021-07-07 DIAGNOSIS — R2689 Other abnormalities of gait and mobility: Secondary | ICD-10-CM

## 2021-07-07 DIAGNOSIS — G8929 Other chronic pain: Secondary | ICD-10-CM

## 2021-07-07 DIAGNOSIS — R202 Paresthesia of skin: Secondary | ICD-10-CM | POA: Diagnosis not present

## 2021-07-07 DIAGNOSIS — M545 Low back pain, unspecified: Secondary | ICD-10-CM

## 2021-07-07 DIAGNOSIS — M79601 Pain in right arm: Secondary | ICD-10-CM

## 2021-07-07 DIAGNOSIS — R200 Anesthesia of skin: Secondary | ICD-10-CM

## 2021-07-07 DIAGNOSIS — M5416 Radiculopathy, lumbar region: Secondary | ICD-10-CM

## 2021-07-10 ENCOUNTER — Telehealth: Payer: Self-pay

## 2021-07-10 NOTE — Telephone Encounter (Signed)
Sooner appt made

## 2021-07-10 NOTE — Telephone Encounter (Signed)
Pt called and would like to be worked in before the 1st. She is going out of town Thursday and would just like to be seen sooner

## 2021-07-11 ENCOUNTER — Ambulatory Visit (INDEPENDENT_AMBULATORY_CARE_PROVIDER_SITE_OTHER): Payer: Medicare Other | Admitting: Orthopaedic Surgery

## 2021-07-11 ENCOUNTER — Ambulatory Visit (INDEPENDENT_AMBULATORY_CARE_PROVIDER_SITE_OTHER): Payer: Medicare Other

## 2021-07-11 ENCOUNTER — Encounter: Payer: Self-pay | Admitting: Orthopaedic Surgery

## 2021-07-11 ENCOUNTER — Other Ambulatory Visit: Payer: Self-pay

## 2021-07-11 DIAGNOSIS — S82045A Nondisplaced comminuted fracture of left patella, initial encounter for closed fracture: Secondary | ICD-10-CM

## 2021-07-11 DIAGNOSIS — I2510 Atherosclerotic heart disease of native coronary artery without angina pectoris: Secondary | ICD-10-CM

## 2021-07-11 NOTE — Progress Notes (Signed)
Office Visit Note   Patient: Savannah [name]           Date of Birth: 1955/12/15           MRN: [id number] Visit Date: 07/11/2021              Requested by: Vivi Barrack, MD [address] [place],  [place] [address] PCP: Vivi Barrack, MD   Assessment & Plan: Visit Diagnoses:  1. Nondisplaced comminuted fracture of left patella, initial encounter for closed fracture     Plan: Fortunately it does appear that she has suffered a nondisplaced patella fracture.  Based on radiographic findings and this should be amenable to nonoperative treatment.  We placed her in a Bledsoe brace and limit her to 45 degrees of flexion.  She may weight-bear as tolerated in the brace.  She is safe to travel to [place] this coming week.  We will recheck her in 3 weeks with two-view x-rays of the left knee.  Follow-Up Instructions: Return in about 3 weeks (around 08/01/2021).   Orders:  Orders Placed This Encounter  Procedures   XR KNEE 3 VIEW LEFT   No orders of the defined types were placed in this encounter.     Procedures: No procedures performed   Clinical Data: No additional findings.   Subjective: Chief Complaint  Patient presents with   Left Knee - Pain    Savannah [name] returns today for continued left knee pain.  She fell directly on her left knee about a week ago and has had continued swelling and pain and weakness with knee extension.  She has noticed that the bruising has improved.   Review of Systems   Objective: Vital Signs: There were no vitals taken for this visit.  Physical Exam  Ortho Exam Left knee shows moderate joint effusion.  Resolving ecchymosis.  Skin intact.  Patella is tender to palpation.  Gentle range of motion is well-tolerated.  Specialty Comments:  No specialty comments available.  Imaging: XR KNEE 3 VIEW LEFT  Result Date: 07/11/2021 Nondisplaced patella fracture    PMFS History: Patient Active Problem List   Diagnosis Date Noted   Osteopenia  01/14/2021   Sesamoiditis of left foot 11/09/2020   Posterior tibial tendinitis of left lower extremity 11/09/2020   Type 2 diabetes mellitus with diabetic peripheral angiopathy without gangrene, with long-term current use of insulin ([place]) 10/21/2020   Iron deficiency anemia 03/14/2020   Deficiency anemia 02/29/2020   Lesion of vulva 09/02/2019   Menorrhagia 09/02/2019   Neuropathy 09/02/2019   Irregular bowel habits 06/25/2019   Coronary artery disease involving native coronary artery of native heart without angina pectoris    Temporomandibular joint disorder 12/05/2018   Fatigue 12/05/2018   Inverted nipple 12/05/2018   Senile purpura ([place]) 11/21/2018   GERD with esophagitis 11/21/2018   Pseudogout involving multiple joints 11/21/2018   Lipoma 12/87/8676   Lichen sclerosus 72/07/4708   H/O gastric bypass 11/16/2016   GERD (gastroesophageal reflux disease) 11/16/2016   Depression 11/16/2016   Diabetic polyneuropathy associated with type 2 diabetes mellitus ([place]) 11/16/2016   Uterine cancer ([place]) s/p hysterectomy 1997 01/04/2012   Postsurgical hypothyroidism 09/19/2010   Diabetic neuropathy ([place]) 11/21/2007   Allergic rhinitis 11/21/2007   Dyslipidemia associated with type 2 diabetes mellitus ([place]) 07/08/2007   Past Medical History:  Diagnosis Date   ABDOMINAL PAIN, CHRONIC 10/20/2009   Acute cystitis 08/17/2009   ALLERGIC RHINITIS 11/21/2007   Allergy    ASYMPTOMATIC POSTMENOPAUSAL STATUS  09/29/2008   Bariatric surgery status 07/07/2010   Bariatric surgery status 07/07/2010   Qualifier: Diagnosis of  By: Loanne Drilling MD, Sean A    Cancer [facility])    uterine   Cataract    Coronary artery disease    DEPRESSION 11/21/2007   DIABETES MELLITUS, TYPE II 07/08/2007   DYSPNEA 05/20/2009   Edema 75/03/1832   Eosinophilic esophagitis 58/25/1898   FEVER UNSPECIFIED 10/20/2009   FEVER, HX OF 03/09/2010   GERD (gastroesophageal reflux disease)    Headache(784.0) 02/06/2010    Hepatomegaly 01/06/2008   HYPERLIPIDEMIA 07/08/2007   HYPERTENSION 07/08/2007   LEUKOPENIA, MILD 09/29/2008   OBESITY 01/06/2008   OSTEOARTHRITIS 01/06/2008   Other chronic nonalcoholic liver disease 42/08/3127   PERIPHERAL NEUROPATHY 11/21/2007   Peripheral neuropathy    Postsurgical hypothyroidism 09/19/2010   SINUSITIS- ACUTE-NOS 11/21/2007   TB SKIN TEST, POSITIVE 02/06/2010   THYROID NODULE 03/09/2010    Family History  Problem Relation Age of Onset   Diabetes Mother    Neuropathy Mother    Diabetes Father    Congestive Heart Failure Father    Hypertension Father    Myelodysplastic syndrome Father    Bladder Cancer Sister    Multiple sclerosis Sister    Breast cancer Sister        breast onset age 46   Osteoporosis Sister    Liver cancer Maternal Aunt    Breast cancer Maternal Aunt    Diabetes Maternal Aunt    Colon cancer Maternal Uncle    Diabetes Maternal Uncle    Neuropathy Paternal Aunt    Diabetes Maternal Grandmother    Diabetes Paternal Grandmother     Past Surgical History:  Procedure Laterality Date   ABDOMINAL HYSTERECTOMY     BASAL CELL CARCINOMA EXCISION     CARDIAC CATHETERIZATION     CHOLECYSTECTOMY N/A 05/11/2021   Procedure: LAPAROSCOPIC CHOLECYSTECTOMY WITH POSSIBLE  INTRAOPERATIVE CHOLANGIOGRAM;  Surgeon: Johnathan Hausen, MD;  Location[facility];  Service: General;  Laterality: N/A;   COLONOSCOPY     EYE SURGERY Bilateral 08/2018, 09/2018   FOOT SURGERY Left    10/2019   KNEE ARTHROSCOPY Left    LEFT HEART CATH AND CORONARY ANGIOGRAPHY N/A 01/20/2019   Procedure: LEFT HEART CATH AND CORONARY ANGIOGRAPHY;  Surgeon: Belva Crome, MD;  Location: [facility];  Service: Cardiovascular;  Laterality: N/A;   LYMPH NODE DISSECTION     OOPHORECTOMY     Right total knee replacement     ROUX-EN-Y GASTRIC BYPASS  2011   SKIN TAG REMOVAL     THYROIDECTOMY     TONSILLECTOMY AND ADENOIDECTOMY     UPPER GASTROINTESTINAL ENDOSCOPY     Social  History   Occupational History   Occupation: Optometrist   Occupation: Retired  Tobacco Use   Smoking status: Former    Packs/day: 1.50    Years: 15.00    Pack years: 22.50    Types: Cigarettes    Quit date: 1992    Years since quitting: 30.6   Smokeless tobacco: Never  Vaping Use   Vaping Use: Never used  Substance and Sexual Activity   Alcohol use: Yes    Comment: rarely   Drug use: Never   Sexual activity: Not Currently

## 2021-07-12 ENCOUNTER — Telehealth: Payer: Self-pay | Admitting: Orthopaedic Surgery

## 2021-07-13 ENCOUNTER — Ambulatory Visit: Payer: Medicare Other | Admitting: Orthopaedic Surgery

## 2021-07-14 ENCOUNTER — Telehealth: Payer: Self-pay | Admitting: Neurology

## 2021-07-14 NOTE — Telephone Encounter (Signed)
Please let patient know our records department has called [facility] multiple times and faxed in multiple requests for records. We have not been able to get them. If she would like me to review prior she will have to contact [facility] and have them sent to [place], debra has done her part and cannot do anymore; if patient would like me to review she will have to participate in getting the records to [place] herself. Our apologies, but only so much we can do and patients have to help as well. But if she is fine with the testing and results we have done then it is her choice we don;t have to have the records. thanks

## 2021-07-18 ENCOUNTER — Telehealth: Payer: Self-pay | Admitting: *Deleted

## 2021-07-18 NOTE — Telephone Encounter (Signed)
I spoke with patient and let her know that our medical records office have reached out to [facility] multiple times and also faxed in multiple request for records.  We have not been able to get them.  Discussed that if patient would like for Dr. Jaynee Eagles to review the record she would need to contact [facility].  Patient let me know that Dr. Phoebe Sharps office had received the records and they were scanned into her chart and asked if we could see them.  I was able to locate [facility] notes, scanned on 07/03/2021.  I let the patient know they would be printed and given to Dr. Jaynee Eagles for review.  Patient stated that despite the test that Dr. Jaynee Eagles has done which have all been pretty normal, her hand is still killing her.  She wants to know if she should be seen again.  I let her know I would send a message over to Dr. Jaynee Eagles and give her a call back. She verbalized appreciation.

## 2021-07-18 NOTE — Telephone Encounter (Signed)
R/c medical notes from [facility] notes on [place] desk.

## 2021-07-20 NOTE — Telephone Encounter (Signed)
I called patient I relayed Dr. Jaynee Eagles, The MRI of the right wrist showed Severe tenosynovitis of the flexor digitorum tendon sheath just beyond the carpal tunnel and  Severe degeneration and thinning of the TFCC with full-thickness tear of the body (triangular fibrocartilage complex (TFCC) connects the bones in your forearm with bones in your wrist.). The pain appears to be more orthopaedic as opposed to neurologic as above I would follow up with orthopaedics for the wrist.  Pt stated she had Dr. Erlinda Hong view as well and referral was made to Dr. Tempie Donning [facility]) and has appt next week.  She appreciated call back.  She made mention that Dr. Erlinda Hong stated that there was some CT involvement too.

## 2021-07-27 ENCOUNTER — Ambulatory Visit (INDEPENDENT_AMBULATORY_CARE_PROVIDER_SITE_OTHER): Payer: Medicare Other | Admitting: Orthopedic Surgery

## 2021-07-27 ENCOUNTER — Encounter: Payer: Self-pay | Admitting: Orthopedic Surgery

## 2021-07-27 ENCOUNTER — Other Ambulatory Visit: Payer: Self-pay

## 2021-07-27 DIAGNOSIS — Z01411 Encounter for gynecological examination (general) (routine) with abnormal findings: Secondary | ICD-10-CM | POA: Diagnosis not present

## 2021-07-27 DIAGNOSIS — N952 Postmenopausal atrophic vaginitis: Secondary | ICD-10-CM | POA: Diagnosis not present

## 2021-07-27 DIAGNOSIS — Z1231 Encounter for screening mammogram for malignant neoplasm of breast: Secondary | ICD-10-CM | POA: Diagnosis not present

## 2021-07-27 DIAGNOSIS — M19031 Primary osteoarthritis, right wrist: Secondary | ICD-10-CM

## 2021-07-27 DIAGNOSIS — Z8542 Personal history of malignant neoplasm of other parts of uterus: Secondary | ICD-10-CM | POA: Diagnosis not present

## 2021-07-27 DIAGNOSIS — M1811 Unilateral primary osteoarthritis of first carpometacarpal joint, right hand: Secondary | ICD-10-CM

## 2021-07-27 DIAGNOSIS — Z6823 Body mass index (BMI) 23.0-23.9, adult: Secondary | ICD-10-CM | POA: Diagnosis not present

## 2021-07-27 DIAGNOSIS — L900 Lichen sclerosus et atrophicus: Secondary | ICD-10-CM | POA: Diagnosis not present

## 2021-07-27 LAB — HM MAMMOGRAPHY

## 2021-07-28 DIAGNOSIS — M1811 Unilateral primary osteoarthritis of first carpometacarpal joint, right hand: Secondary | ICD-10-CM | POA: Insufficient documentation

## 2021-07-28 DIAGNOSIS — M19031 Primary osteoarthritis, right wrist: Secondary | ICD-10-CM | POA: Insufficient documentation

## 2021-07-28 NOTE — Progress Notes (Signed)
Office Visit Note   Patient: Savannah [name]           Date of Birth: [birth year]           MRN: [id number] Visit Date: 07/27/2021              Requested by: Vivi Barrack, MD [address] [place],  [place] [address] PCP: Vivi Barrack, MD   Assessment & Plan: Visit Diagnoses:  1. Arthritis of carpometacarpal (CMC) joint of right thumb   2. Primary osteoarthritis of right distal radioulnar joint   3. Primary osteoarthritis of right wrist     Plan: Discussed with patient that she has pretty significant degenerative changes of her right wrist and base of thumb.  Most of her pain seems to be coming from the thumb [facility] joint and the DRUJ on my exam.  We discussed the various treatment options for osteoarthritis including conservative management with activity modification, bracing, NSAIDs and corticosteroid injections.  We also discussed several surgical treatment options for her arthritis including a Darrach distal ulna resection for her significant DRUJ pain as well as LRTI for treatment of her basilar thumb pain.  We discussed the risks and benefits of both operative and nonoperative treatment.  At this point, she wants to try everything she can to avoid surgery.  She previously had a corticosteroid injection into her right carpal tunnel but has never had any steroid junctions into the base of her thumb or her wrist.  We will proceed with a corticosteroid injection into her DRUJ and thumb CMC joint.  We will see her back in the office if she fails to gain significant relief at which time we can discuss surgery again.  Follow-Up Instructions: No follow-ups on file.   Orders:  No orders of the defined types were placed in this encounter.  No orders of the defined types were placed in this encounter.     Procedures: No procedures performed   Clinical Data: No additional findings.   Subjective: Chief Complaint  Patient presents with   Right Wrist - Follow-up    This is a  65 year old right-hand-dominant female who presents today with right wrist and hand pain.  She has had pain in this wrist for quite some time.  She had a fall around May of this year after which her pain worsened.  She reports that he fell she fell onto her outstretched right hand.  Since then, she had weakness and chronic pain in this wrist.  She is worn a brace which helps some.  She also notes diminished strength in his hand.  She describes pain that is 7/10 at worst at the base of her thumb and in the mid aspect of the dorsal wrist.  Her pain is worse when carrying things or with prolonged activity.  Of note she did have a distal radius fracture as a child.  She had bariatric surgery in the past and as such is unable to take NSAIDs.  She takes tramadol occasionally, a few times a month, but does not like it.  She also describes mild numbness and paresthesias in the thumb index and middle finger.  This occasionally wakes her from sleep but is not as significant as her joint pain.   Review of Systems  Constitutional: Negative.   Respiratory: Negative.    Cardiovascular: Negative.   Musculoskeletal:  Positive for joint swelling.  Skin: Negative.     Objective: Vital Signs: BP 110/64 (BP Location: Left Arm,  Patient Position: Sitting, Cuff Size: Normal)   Pulse 83   Ht 5' 11"  (1.803 m)   Wt 165 lb (74.8 kg)   SpO2 96%   BMI 23.01 kg/m   Physical Exam Constitutional:      Appearance: Normal appearance.  Cardiovascular:     Rate and Rhythm: Normal rate.     Pulses: Normal pulses.  Pulmonary:     Effort: Pulmonary effort is normal.  Skin:    General: Skin is warm and dry.     Capillary Refill: Capillary refill takes less than 2 seconds.  Neurological:     Mental Status: She is alert.    Right Hand Exam   Tenderness  Right hand tenderness location: TTP over DRUJ and base of thumb.  Range of Motion  Wrist  Extension:  40  Flexion:  30  Pronation:  60 (Pain and crepitus)  Right  wrist supination: Full.   Muscle Strength  Wrist extension: 4/5  Wrist flexion: 4/5  Grip: 4/5   Tests  Tinel's sign (median nerve): negative  Other  Erythema: absent Scars: absent Sensation: normal Pulse: present  Comments:  Minimal pain w/ PROM of wrist.  Severe pain w/ DRUJ shuck test or compression of the radius and ulna.  +CMC grind test.  No MP hyperextension, no palmar abduction contracture.  Mildly positive carpal tunnel compression test.      Specialty Comments:  No specialty comments available.  Imaging: Prior XR of the right wrist reviewed by me.  The study demonstrates degenerative changes at the thumb [facility] and STT joints.  She has severe arthritis involving the DRUJ w/ osteophytes and joint space narrowing.  She has mild degenerative radiocarpal joint changes.    PMFS History: Patient Active Problem List   Diagnosis Date Noted   Arthritis of carpometacarpal [facility]) joint of right thumb 07/28/2021   Primary osteoarthritis of right distal radioulnar joint 07/28/2021   Arthritis of wrist, right, degenerative 07/28/2021   Osteopenia 01/14/2021   Sesamoiditis of left foot 11/09/2020   Posterior tibial tendinitis of left lower extremity 11/09/2020   Type 2 diabetes mellitus with diabetic peripheral angiopathy without gangrene, with long-term current use of insulin ([place]) 10/21/2020   Iron deficiency anemia 03/14/2020   Deficiency anemia 02/29/2020   Lesion of vulva 09/02/2019   Menorrhagia 09/02/2019   Neuropathy 09/02/2019   Irregular bowel habits 06/25/2019   Coronary artery disease involving native coronary artery of native heart without angina pectoris    Temporomandibular joint disorder 12/05/2018   Fatigue 12/05/2018   Inverted nipple 12/05/2018   Senile purpura ([place]) 11/21/2018   GERD with esophagitis 11/21/2018   Pseudogout involving multiple joints 11/21/2018   Lipoma 38/88/2800   Lichen sclerosus 34/91/7915   H/O gastric bypass 11/16/2016   GERD  (gastroesophageal reflux disease) 11/16/2016   Depression 11/16/2016   Diabetic polyneuropathy associated with type 2 diabetes mellitus ([place]) 11/16/2016   Uterine cancer ([place]) s/p hysterectomy 1997 01/04/2012   Postsurgical hypothyroidism 09/19/2010   Diabetic neuropathy ([place]) 11/21/2007   Allergic rhinitis 11/21/2007   Dyslipidemia associated with type 2 diabetes mellitus ([place]) 07/08/2007   Past Medical History:  Diagnosis Date   ABDOMINAL PAIN, CHRONIC 10/20/2009   Acute cystitis 08/17/2009   ALLERGIC RHINITIS 11/21/2007   Allergy    ASYMPTOMATIC POSTMENOPAUSAL STATUS 09/29/2008   Bariatric surgery status 07/07/2010   Bariatric surgery status 07/07/2010   Qualifier: Diagnosis of  By: Loanne Drilling MD, Sean A    Cancer [facility])    uterine  Cataract    Coronary artery disease    DEPRESSION 11/21/2007   DIABETES MELLITUS, TYPE II 07/08/2007   DYSPNEA 05/20/2009   Edema 45/36/4680   Eosinophilic esophagitis 32/10/2481   FEVER UNSPECIFIED 10/20/2009   FEVER, HX OF 03/09/2010   GERD (gastroesophageal reflux disease)    Headache(784.0) 02/06/2010   Hepatomegaly 01/06/2008   HYPERLIPIDEMIA 07/08/2007   HYPERTENSION 07/08/2007   LEUKOPENIA, MILD 09/29/2008   OBESITY 01/06/2008   OSTEOARTHRITIS 01/06/2008   Other chronic nonalcoholic liver disease 50/01/7047   PERIPHERAL NEUROPATHY 11/21/2007   Peripheral neuropathy    Postsurgical hypothyroidism 09/19/2010   SINUSITIS- ACUTE-NOS 11/21/2007   TB SKIN TEST, POSITIVE 02/06/2010   THYROID NODULE 03/09/2010    Family History  Problem Relation Age of Onset   Diabetes Mother    Neuropathy Mother    Diabetes Father    Congestive Heart Failure Father    Hypertension Father    Myelodysplastic syndrome Father    Bladder Cancer Sister    Multiple sclerosis Sister    Breast cancer Sister        breast onset age 7   Osteoporosis Sister    Liver cancer Maternal Aunt    Breast cancer Maternal Aunt    Diabetes Maternal Aunt    Colon  cancer Maternal Uncle    Diabetes Maternal Uncle    Neuropathy Paternal Aunt    Diabetes Maternal Grandmother    Diabetes Paternal Grandmother     Past Surgical History:  Procedure Laterality Date   ABDOMINAL HYSTERECTOMY     BASAL CELL CARCINOMA EXCISION     CARDIAC CATHETERIZATION     CHOLECYSTECTOMY N/A 05/11/2021   Procedure: LAPAROSCOPIC CHOLECYSTECTOMY WITH POSSIBLE  INTRAOPERATIVE CHOLANGIOGRAM;  Surgeon: Johnathan Hausen, MD;  Location[facility];  Service: General;  Laterality: N/A;   COLONOSCOPY     EYE SURGERY Bilateral 08/2018, 09/2018   FOOT SURGERY Left    10/2019   KNEE ARTHROSCOPY Left    LEFT HEART CATH AND CORONARY ANGIOGRAPHY N/A 01/20/2019   Procedure: LEFT HEART CATH AND CORONARY ANGIOGRAPHY;  Surgeon: Belva Crome, MD;  Location: [place] [facility];  Service: Cardiovascular;  Laterality: N/A;   LYMPH NODE DISSECTION     OOPHORECTOMY     Right total knee replacement     ROUX-EN-Y GASTRIC BYPASS  2011   SKIN TAG REMOVAL     THYROIDECTOMY     TONSILLECTOMY AND ADENOIDECTOMY     UPPER GASTROINTESTINAL ENDOSCOPY     Social History   Occupational History   Occupation: Optometrist   Occupation: Retired  Tobacco Use   Smoking status: Former    Packs/day: 1.50    Years: 15.00    Pack years: 22.50    Types: Cigarettes    Quit date: 1992    Years since quitting: 30.7   Smokeless tobacco: Never  Vaping Use   Vaping Use: Never used  Substance and Sexual Activity   Alcohol use: Yes    Comment: rarely   Drug use: Never   Sexual activity: Not Currently

## 2021-08-01 ENCOUNTER — Ambulatory Visit (INDEPENDENT_AMBULATORY_CARE_PROVIDER_SITE_OTHER): Payer: Medicare Other | Admitting: Orthopaedic Surgery

## 2021-08-01 ENCOUNTER — Encounter: Payer: Self-pay | Admitting: Orthopaedic Surgery

## 2021-08-01 ENCOUNTER — Ambulatory Visit (INDEPENDENT_AMBULATORY_CARE_PROVIDER_SITE_OTHER): Payer: Medicare Other

## 2021-08-01 ENCOUNTER — Other Ambulatory Visit: Payer: Self-pay

## 2021-08-01 DIAGNOSIS — S82045A Nondisplaced comminuted fracture of left patella, initial encounter for closed fracture: Secondary | ICD-10-CM | POA: Diagnosis not present

## 2021-08-01 DIAGNOSIS — G8929 Other chronic pain: Secondary | ICD-10-CM | POA: Diagnosis not present

## 2021-08-01 DIAGNOSIS — M25562 Pain in left knee: Secondary | ICD-10-CM

## 2021-08-01 DIAGNOSIS — I2510 Atherosclerotic heart disease of native coronary artery without angina pectoris: Secondary | ICD-10-CM | POA: Diagnosis not present

## 2021-08-01 NOTE — Progress Notes (Signed)
Patient: Savannah [name]           Date of Birth: July 02, 1956           MRN: [id number] Visit Date: 08/01/2021 PCP: Vivi Barrack, MD   Assessment & Plan:  Chief Complaint:  Chief Complaint  Patient presents with   Left Knee - Pain   Visit Diagnoses:  1. Nondisplaced comminuted fracture of left patella, initial encounter for closed fracture     Plan: Numa follows up today 4 weeks status post nondisplaced comminuted left patella fracture.  Overall doing better.  Has some increased pain when flexing her knee.  Left knee shows mild swelling without significant tenderness over the patella.  There is no pain with gentle range of motion of the knee to about 90 degrees.  I explained the x-rays to her which does show continued healing of the fracture and consolidation of the fracture fragments.  We will open up the brace to 60 degrees.  Weight-bear as tolerated and activity as tolerated.  Recheck in 2 weeks with lateral x-rays of the left knee.  Follow-Up Instructions: Return in about 2 weeks (around 08/15/2021).   Orders:  Orders Placed This Encounter  Procedures   XR Knee 1-2 Views Left   No orders of the defined types were placed in this encounter.   Imaging: XR Knee 1-2 Views Left  Result Date: 08/01/2021 There is evidence of fracture healing and consolidation compared to prior x-rays.  No interval worsening.   PMFS History: Patient Active Problem List   Diagnosis Date Noted   Nondisplaced comminuted fracture of left patella, initial encounter for closed fracture 08/01/2021   Arthritis of carpometacarpal [facility]) joint of right thumb 07/28/2021   Primary osteoarthritis of right distal radioulnar joint 07/28/2021   Arthritis of wrist, right, degenerative 07/28/2021   Osteopenia 01/14/2021   Sesamoiditis of left foot 11/09/2020   Posterior tibial tendinitis of left lower extremity 11/09/2020   Type 2 diabetes mellitus with diabetic peripheral angiopathy without gangrene, with  long-term current use of insulin ([place]) 10/21/2020   Iron deficiency anemia 03/14/2020   Deficiency anemia 02/29/2020   Lesion of vulva 09/02/2019   Menorrhagia 09/02/2019   Neuropathy 09/02/2019   Irregular bowel habits 06/25/2019   Coronary artery disease involving native coronary artery of native heart without angina pectoris    Temporomandibular joint disorder 12/05/2018   Fatigue 12/05/2018   Inverted nipple 12/05/2018   Senile purpura ([place]) 11/21/2018   GERD with esophagitis 11/21/2018   Pseudogout involving multiple joints 11/21/2018   Lipoma 94/70/9628   Lichen sclerosus 36/62/9476   H/O gastric bypass 11/16/2016   GERD (gastroesophageal reflux disease) 11/16/2016   Depression 11/16/2016   Diabetic polyneuropathy associated with type 2 diabetes mellitus ([place]) 11/16/2016   Uterine cancer ([place]) s/p hysterectomy 1997 01/04/2012   Postsurgical hypothyroidism 09/19/2010   Diabetic neuropathy ([place]) 11/21/2007   Allergic rhinitis 11/21/2007   Dyslipidemia associated with type 2 diabetes mellitus ([place]) 07/08/2007   Past Medical History:  Diagnosis Date   ABDOMINAL PAIN, CHRONIC 10/20/2009   Acute cystitis 08/17/2009   ALLERGIC RHINITIS 11/21/2007   Allergy    ASYMPTOMATIC POSTMENOPAUSAL STATUS 09/29/2008   Bariatric surgery status 07/07/2010   Bariatric surgery status 07/07/2010   Qualifier: Diagnosis of  By: Loanne Drilling MD, Sean A    Cancer [facility])    uterine   Cataract    Coronary artery disease    DEPRESSION 11/21/2007   DIABETES MELLITUS, TYPE II 07/08/2007   DYSPNEA  05/20/2009   Edema 35/67/0141   Eosinophilic esophagitis 01/10/3142   FEVER UNSPECIFIED 10/20/2009   FEVER, HX OF 03/09/2010   GERD (gastroesophageal reflux disease)    Headache(784.0) 02/06/2010   Hepatomegaly 01/06/2008   HYPERLIPIDEMIA 07/08/2007   HYPERTENSION 07/08/2007   LEUKOPENIA, MILD 09/29/2008   OBESITY 01/06/2008   OSTEOARTHRITIS 01/06/2008   Other chronic nonalcoholic liver disease  88/87/5797   PERIPHERAL NEUROPATHY 11/21/2007   Peripheral neuropathy    Postsurgical hypothyroidism 09/19/2010   SINUSITIS- ACUTE-NOS 11/21/2007   TB SKIN TEST, POSITIVE 02/06/2010   THYROID NODULE 03/09/2010    Family History  Problem Relation Age of Onset   Diabetes Mother    Neuropathy Mother    Diabetes Father    Congestive Heart Failure Father    Hypertension Father    Myelodysplastic syndrome Father    Bladder Cancer Sister    Multiple sclerosis Sister    Breast cancer Sister        breast onset age 98   Osteoporosis Sister    Liver cancer Maternal Aunt    Breast cancer Maternal Aunt    Diabetes Maternal Aunt    Colon cancer Maternal Uncle    Diabetes Maternal Uncle    Neuropathy Paternal Aunt    Diabetes Maternal Grandmother    Diabetes Paternal Grandmother     Past Surgical History:  Procedure Laterality Date   ABDOMINAL HYSTERECTOMY     BASAL CELL CARCINOMA EXCISION     CARDIAC CATHETERIZATION     CHOLECYSTECTOMY N/A 05/11/2021   Procedure: LAPAROSCOPIC CHOLECYSTECTOMY WITH POSSIBLE  INTRAOPERATIVE CHOLANGIOGRAM;  Surgeon: Johnathan Hausen, MD;  [facility];  Service: General;  Laterality: N/A;   COLONOSCOPY     EYE SURGERY Bilateral 08/2018, 09/2018   FOOT SURGERY Left    10/2019   KNEE ARTHROSCOPY Left    LEFT HEART CATH AND CORONARY ANGIOGRAPHY N/A 01/20/2019   Procedure: LEFT HEART CATH AND CORONARY ANGIOGRAPHY;  Surgeon: Belva Crome, MD;  Location: [place] [facility];  Service: Cardiovascular;  Laterality: N/A;   LYMPH NODE DISSECTION     OOPHORECTOMY     Right total knee replacement     ROUX-EN-Y GASTRIC BYPASS  2011   SKIN TAG REMOVAL     THYROIDECTOMY     TONSILLECTOMY AND ADENOIDECTOMY     UPPER GASTROINTESTINAL ENDOSCOPY     Social History   Occupational History   Occupation: Optometrist   Occupation: Retired  Tobacco Use   Smoking status: Former    Packs/day: 1.50    Years: 15.00    Pack years: 22.50    Types: Cigarettes    Quit  date: 1992    Years since quitting: 30.7   Smokeless tobacco: Never  Vaping Use   Vaping Use: Never used  Substance and Sexual Activity   Alcohol use: Yes    Comment: rarely   Drug use: Never   Sexual activity: Not Currently

## 2021-08-03 ENCOUNTER — Other Ambulatory Visit: Payer: Self-pay | Admitting: Family Medicine

## 2021-08-07 ENCOUNTER — Telehealth: Payer: Self-pay | Admitting: Internal Medicine

## 2021-08-07 NOTE — Telephone Encounter (Signed)
*  STAT* If patient is at the pharmacy, call can be transferred to refill team.   1. Which medications need to be refilled? (please list name of each medication and dose if known) atorvastatin (LIPITOR) 40 MG tablet  2. Which pharmacy/location (including street and city if local pharmacy) is medication to be sent to? [id number] [place], [place] [address]. Do they need a 30 day or 90 day supply?  90 day supply    Patient is completely out of medication

## 2021-08-08 ENCOUNTER — Other Ambulatory Visit: Payer: Self-pay | Admitting: Family Medicine

## 2021-08-09 ENCOUNTER — Other Ambulatory Visit: Payer: Self-pay | Admitting: Surgery

## 2021-08-09 ENCOUNTER — Telehealth: Payer: Self-pay | Admitting: Internal Medicine

## 2021-08-09 DIAGNOSIS — Z79899 Other long term (current) drug therapy: Secondary | ICD-10-CM

## 2021-08-09 DIAGNOSIS — K912 Postsurgical malabsorption, not elsewhere classified: Secondary | ICD-10-CM

## 2021-08-09 NOTE — Telephone Encounter (Signed)
Pt c/o medication issue:  1. Name of Medication: Lipitor 40 mg  2. How are you currently taking this medication (dosage and times per day)? Pt is not currently taking this medicine. A hospitalist at [facility] discontinued this medicine while pt was in the hospital having her Gallbladder  taking out a few months ago  3. Are you having a reaction (difficulty breathing--STAT)? No  4. What is your medication issue? Pt would like a new script for this medicine. Pt states once the  hospitalist discontinued this medication, she did not stop taking it.

## 2021-08-09 NOTE — Telephone Encounter (Signed)
Spoke to patient she stated atorvastain was stopped when she was in hospital.Stated it was not on her discharge meds,but she restarted 40 mg daily.She needs a refill.Advised I will send message to Dr.Acharya to make sure she wants you to continue.

## 2021-08-13 ENCOUNTER — Other Ambulatory Visit: Payer: Self-pay | Admitting: Endocrinology

## 2021-08-14 ENCOUNTER — Ambulatory Visit: Payer: Medicare Other | Admitting: Rheumatology

## 2021-08-15 ENCOUNTER — Ambulatory Visit: Payer: Self-pay

## 2021-08-15 ENCOUNTER — Other Ambulatory Visit: Payer: Self-pay

## 2021-08-15 ENCOUNTER — Encounter: Payer: Self-pay | Admitting: Orthopaedic Surgery

## 2021-08-15 ENCOUNTER — Ambulatory Visit (INDEPENDENT_AMBULATORY_CARE_PROVIDER_SITE_OTHER): Payer: Medicare Other | Admitting: Orthopaedic Surgery

## 2021-08-15 DIAGNOSIS — Z23 Encounter for immunization: Secondary | ICD-10-CM | POA: Diagnosis not present

## 2021-08-15 DIAGNOSIS — S82045A Nondisplaced comminuted fracture of left patella, initial encounter for closed fracture: Secondary | ICD-10-CM

## 2021-08-15 DIAGNOSIS — I2510 Atherosclerotic heart disease of native coronary artery without angina pectoris: Secondary | ICD-10-CM | POA: Diagnosis not present

## 2021-08-15 NOTE — Progress Notes (Signed)
Office Visit Note   Patient: Savannah [name]           Date of Birth: 09/09/1956           MRN: [id number] Visit Date: 08/15/2021              Requested by: Vivi Barrack, MD [address] [place],  [place] [address] PCP: Vivi Barrack, MD   Assessment & Plan: Visit Diagnoses:  1. Nondisplaced comminuted fracture of left patella, initial encounter for closed fracture     Plan: There returns today for follow-up of left patella fracture.  She is doing much better overall with pain and activity and weightbearing and walking.  No real complaints other than having to wear the brace.  She has no tenderness to the patella.  Range of motion has significantly improved as well.  Her x-rays demonstrate consolidation of the main fracture lines.  At this point we will discontinue the brace and allow range of motion as tolerated as well as activity as tolerated.  Follow-up as needed.  She may make an appointment in the future with Dr. Sharol Given for chronic foot and ankle problems.  Follow-Up Instructions: Return if symptoms worsen or fail to improve.   Orders:  Orders Placed This Encounter  Procedures   XR Knee 1-2 Views Left   Ambulatory referral to Physical Therapy   No orders of the defined types were placed in this encounter.     Procedures: No procedures performed   Clinical Data: No additional findings.   Subjective: Chief Complaint  Patient presents with   Left Knee - Follow-up    Left nondisplaced patella fracture    HPI  Review of Systems   Objective: Vital Signs: There were no vitals taken for this visit.  Physical Exam  Ortho Exam  Specialty Comments:  No specialty comments available.  Imaging: XR Knee 1-2 Views Left  Result Date: 08/15/2021 Significant fracture consolidation of the patella.      PMFS History: Patient Active Problem List   Diagnosis Date Noted   Nondisplaced comminuted fracture of left patella, initial encounter for closed fracture  08/01/2021   Arthritis of carpometacarpal [facility]) joint of right thumb 07/28/2021   Primary osteoarthritis of right distal radioulnar joint 07/28/2021   Arthritis of wrist, right, degenerative 07/28/2021   Osteopenia 01/14/2021   Sesamoiditis of left foot 11/09/2020   Posterior tibial tendinitis of left lower extremity 11/09/2020   Type 2 diabetes mellitus with diabetic peripheral angiopathy without gangrene, with long-term current use of insulin ([place]) 10/21/2020   Iron deficiency anemia 03/14/2020   Deficiency anemia 02/29/2020   Lesion of vulva 09/02/2019   Menorrhagia 09/02/2019   Neuropathy 09/02/2019   Irregular bowel habits 06/25/2019   Coronary artery disease involving native coronary artery of native heart without angina pectoris    Temporomandibular joint disorder 12/05/2018   Fatigue 12/05/2018   Inverted nipple 12/05/2018   Senile purpura ([place]) 11/21/2018   GERD with esophagitis 11/21/2018   Pseudogout involving multiple joints 11/21/2018   Lipoma 09/08/2535   Lichen sclerosus 64/40/3474   H/O gastric bypass 11/16/2016   GERD (gastroesophageal reflux disease) 11/16/2016   Depression 11/16/2016   Diabetic polyneuropathy associated with type 2 diabetes mellitus ([place]) 11/16/2016   Uterine cancer ([place]) s/p hysterectomy 1997 01/04/2012   Postsurgical hypothyroidism 09/19/2010   Diabetic neuropathy ([place]) 11/21/2007   Allergic rhinitis 11/21/2007   Dyslipidemia associated with type 2 diabetes mellitus ([place]) 07/08/2007   Past Medical History:  Diagnosis Date   ABDOMINAL PAIN, CHRONIC 10/20/2009   Acute cystitis 08/17/2009   ALLERGIC RHINITIS 11/21/2007   Allergy    ASYMPTOMATIC POSTMENOPAUSAL STATUS 09/29/2008   Bariatric surgery status 07/07/2010   Bariatric surgery status 07/07/2010   Qualifier: Diagnosis of  By: Loanne Drilling MD, Sean A    Cancer [facility])    uterine   Cataract    Coronary artery disease    DEPRESSION 11/21/2007   DIABETES MELLITUS, TYPE II 07/08/2007    DYSPNEA 05/20/2009   Edema 55/73/2202   Eosinophilic esophagitis 54/27/0623   FEVER UNSPECIFIED 10/20/2009   FEVER, HX OF 03/09/2010   GERD (gastroesophageal reflux disease)    Headache(784.0) 02/06/2010   Hepatomegaly 01/06/2008   HYPERLIPIDEMIA 07/08/2007   HYPERTENSION 07/08/2007   LEUKOPENIA, MILD 09/29/2008   OBESITY 01/06/2008   OSTEOARTHRITIS 01/06/2008   Other chronic nonalcoholic liver disease 76/28/3151   PERIPHERAL NEUROPATHY 11/21/2007   Peripheral neuropathy    Postsurgical hypothyroidism 09/19/2010   SINUSITIS- ACUTE-NOS 11/21/2007   TB SKIN TEST, POSITIVE 02/06/2010   THYROID NODULE 03/09/2010    Family History  Problem Relation Age of Onset   Diabetes Mother    Neuropathy Mother    Diabetes Father    Congestive Heart Failure Father    Hypertension Father    Myelodysplastic syndrome Father    Bladder Cancer Sister    Multiple sclerosis Sister    Breast cancer Sister        breast onset age 59   Osteoporosis Sister    Liver cancer Maternal Aunt    Breast cancer Maternal Aunt    Diabetes Maternal Aunt    Colon cancer Maternal Uncle    Diabetes Maternal Uncle    Neuropathy Paternal Aunt    Diabetes Maternal Grandmother    Diabetes Paternal Grandmother     Past Surgical History:  Procedure Laterality Date   ABDOMINAL HYSTERECTOMY     BASAL CELL CARCINOMA EXCISION     CARDIAC CATHETERIZATION     CHOLECYSTECTOMY N/A 05/11/2021   Procedure: LAPAROSCOPIC CHOLECYSTECTOMY WITH POSSIBLE  INTRAOPERATIVE CHOLANGIOGRAM;  Surgeon: Johnathan Hausen, MD;  [facility];  Service: General;  Laterality: N/A;   COLONOSCOPY     EYE SURGERY Bilateral 08/2018, 09/2018   FOOT SURGERY Left    10/2019   KNEE ARTHROSCOPY Left    LEFT HEART CATH AND CORONARY ANGIOGRAPHY N/A 01/20/2019   Procedure: LEFT HEART CATH AND CORONARY ANGIOGRAPHY;  Surgeon: Belva Crome, MD;  Location: [place] [facility];  Service: Cardiovascular;  Laterality: N/A;   LYMPH NODE DISSECTION      OOPHORECTOMY     Right total knee replacement     ROUX-EN-Y GASTRIC BYPASS  2011   SKIN TAG REMOVAL     THYROIDECTOMY     TONSILLECTOMY AND ADENOIDECTOMY     UPPER GASTROINTESTINAL ENDOSCOPY     Social History   Occupational History   Occupation: Optometrist   Occupation: Retired  Tobacco Use   Smoking status: Former    Packs/day: 1.50    Years: 15.00    Pack years: 22.50    Types: Cigarettes    Quit date: 1992    Years since quitting: 30.7   Smokeless tobacco: Never  Vaping Use   Vaping Use: Never used  Substance and Sexual Activity   Alcohol use: Yes    Comment: rarely   Drug use: Never   Sexual activity: Not Currently

## 2021-08-18 ENCOUNTER — Ambulatory Visit
Admission: RE | Admit: 2021-08-18 | Discharge: 2021-08-18 | Disposition: A | Discharge status: Home / Self Care | Payer: Medicare Other | Source: Ambulatory Visit | Attending: Surgery | Admitting: Surgery

## 2021-08-18 DIAGNOSIS — K909 Intestinal malabsorption, unspecified: Secondary | ICD-10-CM | POA: Diagnosis not present

## 2021-08-18 DIAGNOSIS — K449 Diaphragmatic hernia without obstruction or gangrene: Secondary | ICD-10-CM | POA: Diagnosis not present

## 2021-08-18 DIAGNOSIS — K912 Postsurgical malabsorption, not elsewhere classified: Secondary | ICD-10-CM

## 2021-08-22 ENCOUNTER — Other Ambulatory Visit: Payer: Self-pay

## 2021-08-22 ENCOUNTER — Ambulatory Visit (INDEPENDENT_AMBULATORY_CARE_PROVIDER_SITE_OTHER): Payer: Medicare Other | Admitting: Physical Therapy

## 2021-08-22 ENCOUNTER — Encounter: Payer: Self-pay | Admitting: Physical Therapy

## 2021-08-22 DIAGNOSIS — R600 Localized edema: Secondary | ICD-10-CM

## 2021-08-22 DIAGNOSIS — M6281 Muscle weakness (generalized): Secondary | ICD-10-CM | POA: Diagnosis not present

## 2021-08-22 DIAGNOSIS — M25662 Stiffness of left knee, not elsewhere classified: Secondary | ICD-10-CM | POA: Diagnosis not present

## 2021-08-22 DIAGNOSIS — R262 Difficulty in walking, not elsewhere classified: Secondary | ICD-10-CM | POA: Diagnosis not present

## 2021-08-22 DIAGNOSIS — M25562 Pain in left knee: Secondary | ICD-10-CM

## 2021-08-22 NOTE — Patient Instructions (Signed)
Access Code: [email or web address] URL: [email or web address] Date: 08/22/2021 Prepared by: Elsie Ra  Exercises Seated Straight Leg Heel Taps - 2 x daily - 6 x weekly - 1-2 sets - 10 reps Seated Long Arc Quad - 2 x daily - 6 x weekly - 2-3 sets - 10 reps Mini Squat with Counter Support - 2 x daily - 6 x weekly - 1-2 sets - 10 reps Standing Tandem Balance with Counter Support - 2 x daily - 6 x weekly - 1 sets - 2 reps - 30 hold Seated Knee Flexion Stretch - 2 x daily - 6 x weekly - 1 sets - 10 reps - 5 sec hold

## 2021-08-22 NOTE — Therapy (Addendum)
[facility] Physical Therapy [address] [place], [place], [address] Phone: [phone]   Fax:  [phone]  Physical Therapy Evaluation/Discharge PHYSICAL THERAPY DISCHARGE SUMMARY  Visits from Start of Care: 1  Current functional level related to goals / functional outcomes: See note   Remaining deficits: See note   Education / Equipment: HEP  Plan:Patient goals were not met. Patient is being discharged due to not returning to PT  Elsie Ra, PT, DPT 04/17/22 10:10 AM     Patient Details  Name: Savannah [name] MRN: [id number] Date of Birth: 1956-04-29 Referring Provider (PT): Leandrew Koyanagi, MD   Encounter Date: 08/22/2021   PT End of Session - 08/22/21 1109     Visit Number 1    Number of Visits 12    Date for PT Re-Evaluation 10/17/21    Authorization Type MCR    Progress Note Due on Visit 10    PT Start Time 4536    PT Stop Time 1101    PT Time Calculation (min) 45 min    Activity Tolerance Patient tolerated treatment well    Behavior During Therapy [facility] for tasks assessed/performed             Past Medical History:  Diagnosis Date   ABDOMINAL PAIN, CHRONIC 10/20/2009   Acute cystitis 08/17/2009   ALLERGIC RHINITIS 11/21/2007   Allergy    ASYMPTOMATIC POSTMENOPAUSAL STATUS 09/29/2008   Bariatric surgery status 07/07/2010   Bariatric surgery status 07/07/2010   Qualifier: Diagnosis of  By: Loanne Drilling MD, Sean A    Cancer [facility])    uterine   Cataract    Coronary artery disease    DEPRESSION 11/21/2007   DIABETES MELLITUS, TYPE II 07/08/2007   DYSPNEA 05/20/2009   Edema 46/80/3212   Eosinophilic esophagitis 24/82/5003   FEVER UNSPECIFIED 10/20/2009   FEVER, HX OF 03/09/2010   GERD (gastroesophageal reflux disease)    Headache(784.0) 02/06/2010   Hepatomegaly 01/06/2008   HYPERLIPIDEMIA 07/08/2007   HYPERTENSION 07/08/2007   LEUKOPENIA, MILD 09/29/2008   OBESITY 01/06/2008   OSTEOARTHRITIS 01/06/2008   Other chronic  nonalcoholic liver disease 70/48/8891   PERIPHERAL NEUROPATHY 11/21/2007   Peripheral neuropathy    Postsurgical hypothyroidism 09/19/2010   SINUSITIS- ACUTE-NOS 11/21/2007   TB SKIN TEST, POSITIVE 02/06/2010   THYROID NODULE 03/09/2010    Past Surgical History:  Procedure Laterality Date   ABDOMINAL HYSTERECTOMY     BASAL CELL CARCINOMA EXCISION     CARDIAC CATHETERIZATION     CHOLECYSTECTOMY N/A 05/11/2021   Procedure: LAPAROSCOPIC CHOLECYSTECTOMY WITH POSSIBLE  INTRAOPERATIVE CHOLANGIOGRAM;  Surgeon: Johnathan Hausen, MD;  Location: [facility];  Service: General;  Laterality: N/A;   COLONOSCOPY     EYE SURGERY Bilateral 08/2018, 09/2018   FOOT SURGERY Left    10/2019   KNEE ARTHROSCOPY Left    LEFT HEART CATH AND CORONARY ANGIOGRAPHY N/A 01/20/2019   Procedure: LEFT HEART CATH AND CORONARY ANGIOGRAPHY;  Surgeon: Belva Crome, MD;  Location: [facility];  Service: Cardiovascular;  Laterality: N/A;   LYMPH NODE DISSECTION     OOPHORECTOMY     Right total knee replacement     ROUX-EN-Y GASTRIC BYPASS  2011   SKIN TAG REMOVAL     THYROIDECTOMY     TONSILLECTOMY AND ADENOIDECTOMY     UPPER GASTROINTESTINAL ENDOSCOPY      There were no vitals filed for this visit.    Subjective Assessment - 08/22/21 1018     Subjective She fell directly  on her Lt knee in August 2022. She had Nondisplaced comminuted fracture of left patella. She also relays bilat wrist pain, and wants treatment for this so she was encouraged to get MD referral for this as well.    Pertinent History DM II, depression, CAD, GERD, Hyperlipidemia, HTN, OA, obesity, peripherial neuropathy    How long can you walk comfortably? 3/4 of a mile    Diagnostic tests Knee XR 08/15/2021 "Significant fracture consolidation of the patella."    Currently in Pain? Yes    Pain Score 4     Pain Location Knee    Pain Orientation Left;Anterior    Pain Descriptors / Indicators Aching;Sharp    Pain Type Acute pain    Pain Onset  More than a month ago    Pain Frequency Intermittent    Aggravating Factors  bending or extending her knee    Pain Relieving Factors rubbing it, pain meds, tyelenol    Multiple Pain Sites No                [facility] PT Assessment - 08/22/21 0001       Assessment   Medical Diagnosis Nondisplaced comminuted fracture of left patella    Referring Provider (PT) Leandrew Koyanagi, MD    Onset Date/Surgical Date --   around 07/11/21   Next MD Visit not scheduled    Prior Therapy last year      Restrictions   Other Position/Activity Restrictions she can discontinue brace and ROM as tolerated per last MD note. No WB restrictions.      Balance Screen   Has the patient fallen in the past 6 months Yes    How many times? 5    Has the patient had a decrease in activity level because of a fear of falling?  Yes    Is the patient reluctant to leave their home because of a fear of falling?  No      Home Environment   Living Environment Private residence    Additional Comments 8 steps to enter, has to do one step at a time, uses handrail and cane      Prior Function   Level of Independence Independent      Cognition   Overall Cognitive Status Within Functional Limits for tasks assessed      Observation/Other Assessments   Focus on Therapeutic Outcomes (FOTO)  55% functional intake, 65% goal      Functional Tests   Functional tests Other      Other:   Other/ Comments tandem stance able to hold 5 sec avg      ROM / Strength   AROM / PROM / Strength AROM;Strength;PROM      AROM   AROM Assessment Site Knee    Right/Left Knee Left    Left Knee Extension 10    Left Knee Flexion 110      PROM   PROM Assessment Site Knee    Right/Left Knee Left    Left Knee Extension 5    Left Knee Flexion 120      Strength   Strength Assessment Site Hip;Knee    Right/Left Hip Left    Left Hip Flexion 2+/5    Left Hip ABduction 4/5    Left Hip ADduction 4+/5    Right/Left Knee Left    Left Knee  Flexion 4/5    Left Knee Extension 2+/5      Standardized Balance Assessment   Standardized Balance Assessment Timed  Up and Go Test;Five Times Sit to Stand    Five times sit to stand comments  23 seconds must use UE support      Timed Up and Go Test   TUG Comments 11 seconds no AD                        Objective measurements completed on examination: See above findings.       [facility] Adult PT Treatment/Exercise - 08/22/21 0001       Exercises   Exercises Knee/Hip      Knee/Hip Exercises: Stretches   Knee: Self-Stretch Limitations tailgate stretch 10 X 5 sec with self O.P      Knee/Hip Exercises: Aerobic   Nustep L4 X8 min UE/LE      Knee/Hip Exercises: Standing   Functional Squat Limitations mini squat with UE support Lt leg further back to increased weight shift onto Lt X10 reps    Other Standing Knee Exercises tandem balance 30 sec X 1 bilat with intermit UE support      Knee/Hip Exercises: Seated   Long Arc Quad 10 reps;Left    Other Seated Knee/Hip Exercises seated SLR X10 reps on Lt                     PT Education - 08/22/21 1108     Education Details HEP,POC    Person(s) Educated Patient    Methods Explanation;Demonstration;Verbal cues;Handout    Comprehension Verbalized understanding;Returned demonstration              PT Short Term Goals - 08/22/21 1115       PT SHORT TERM GOAL #1   Title Pt will be I and compliant with HEP    Time 4    Period Weeks    Status New    Target Date 09/19/21      PT SHORT TERM GOAL #2   Title Pt will improve Lt hip/knee strength to 3+/5 MMT to improve function.    Time 4    Period Weeks    Status New               PT Long Term Goals - 08/22/21 1119       PT LONG TERM GOAL #1   Title Improve FOTO score to 65.    Baseline 55    Time 8    Period Weeks    Status New    Target Date 10/17/21      PT LONG TERM GOAL #2   Title Maryalyce will report Lt knee pain as consistently  0-3/10 on the Numeric Pain Rating Scale.    Baseline 4 currently    Time 8    Period Weeks    Status New      PT LONG TERM GOAL #3   Title Improve 5TSTS score to less than 15 seconds using UE support    Baseline 23 sec    Time 8    Period Weeks    Status New      PT LONG TERM GOAL #4   Title Improve Lt hip/knee strength to 4+/5 MMT tested in sitting to improve functional strength.    Time 8    Period Weeks    Status New      PT LONG TERM GOAL #5   Title -  Plan - 08/22/21 1111     Clinical Impression Statement Pt presents with Lt knee pain, swelling,stiffness, weakness, and difficulty with gait, balance, and stairs after Nondisplaced comminuted fracture of left patella from fall 6 weeks ago. She will benefit from skilled PT to address her functional deficits.    Personal Factors and Comorbidities Comorbidity 3+    Comorbidities DM II, depression, CAD, GERD, Hyperlipidemia, HTN, OA, obesity, peripherial neuropathy    Examination-Activity Limitations Bend;Bed Mobility;Lift;Dressing;Squat;Locomotion Level    Examination-Participation Restrictions Cleaning;Community Activity;Shop    Stability/Clinical Decision Making Evolving/Moderate complexity    Clinical Decision Making Moderate    Rehab Potential Good    PT Frequency 2x / week    PT Duration 6 weeks    PT Treatment/Interventions ADLs/Self Care Home Management;Aquatic Therapy;Cryotherapy;Electrical Stimulation;Iontophoresis 60m/ml Dexamethasone;Moist Heat;Ultrasound;Gait training;Stair training;Therapeutic activities;Therapeutic exercise;Balance training;Neuromuscular re-education;Manual techniques;Passive range of motion;Dry needling;Joint Manipulations;Taping;Vasopneumatic Device    PT Next Visit Plan how is compliance with HEP? needs Lt knee ROM and hip/knee strength    Consulted and Agree with Plan of Care Patient             Patient will benefit from skilled therapeutic intervention in  order to improve the following deficits and impairments:  Decreased activity tolerance, Decreased balance, Decreased mobility, Decreased endurance, Decreased range of motion, Decreased strength, Increased edema, Difficulty walking, Impaired flexibility, Pain  Visit Diagnosis: Acute pain of left knee  Stiffness of left knee, not elsewhere classified  Muscle weakness (generalized)  Difficulty walking  Localized edema     Problem List Patient Active Problem List   Diagnosis Date Noted   Nondisplaced comminuted fracture of left patella, initial encounter for closed fracture 08/01/2021   Arthritis of carpometacarpal (CMC) joint of right thumb 07/28/2021   Primary osteoarthritis of right distal radioulnar joint 07/28/2021   Arthritis of wrist, right, degenerative 07/28/2021   Osteopenia 01/14/2021   Sesamoiditis of left foot 11/09/2020   Posterior tibial tendinitis of left lower extremity 11/09/2020   Type 2 diabetes mellitus with diabetic peripheral angiopathy without gangrene, with long-term current use of insulin ([place] 10/21/2020   Iron deficiency anemia 03/14/2020   Deficiency anemia 02/29/2020   Lesion of vulva 09/02/2019   Menorrhagia 09/02/2019   Neuropathy 09/02/2019   Irregular bowel habits 06/25/2019   Coronary artery disease involving native coronary artery of native heart without angina pectoris    Temporomandibular joint disorder 12/05/2018   Fatigue 12/05/2018   Inverted nipple 12/05/2018   Senile purpura ([place] 11/21/2018   GERD with esophagitis 11/21/2018   Pseudogout involving multiple joints 11/21/2018   Lipoma 011/94/1740  Lichen sclerosus 081/44/8185  H/O gastric bypass 11/16/2016   GERD (gastroesophageal reflux disease) 11/16/2016   Depression 11/16/2016   Diabetic polyneuropathy associated with type 2 diabetes mellitus ([place] 11/16/2016   Uterine cancer ([place] s/p hysterectomy 1997 01/04/2012   Postsurgical hypothyroidism 09/19/2010   Diabetic neuropathy  ([place] 11/21/2007   Allergic rhinitis 11/21/2007   Dyslipidemia associated with type 2 diabetes mellitus ([place] 07/08/2007    BDebbe Odea PT,DPT 08/22/2021, 11:24 AM  [facility] [address] [address]Phone: [phone]  Fax:  [phone] Name: MATHEA HALEYMRN: [id number] of Birth: 629-Mar-1957

## 2021-08-25 ENCOUNTER — Telehealth: Payer: Self-pay

## 2021-08-25 NOTE — Telephone Encounter (Signed)
Pt returning your call...[name] please advise

## 2021-08-25 NOTE — Telephone Encounter (Signed)
Attempted to call patient, left message for patient to call back to office.   

## 2021-08-25 NOTE — Telephone Encounter (Signed)
Please see other telephone encounter. Will close this one. Not needed.

## 2021-08-25 NOTE — Telephone Encounter (Signed)
Recheck CMP. If LFTs normal, strongly recommend resuming atorvastatin.  [place]    Returned call to patient. Made patient aware of Dr. Delphina Cahill recommendations. Order has been placed. Patient states that she never had her lipid panel that was due in June drawn so would like that drawn as well. Advised patient that there was already an active order for Lipid so she could have It drawn with her Cmp and that she would need to fast. Advised patient of Lab instructions. Patient aware and verbalized understanding.

## 2021-09-07 DIAGNOSIS — Z85828 Personal history of other malignant neoplasm of skin: Secondary | ICD-10-CM | POA: Diagnosis not present

## 2021-09-07 DIAGNOSIS — L900 Lichen sclerosus et atrophicus: Secondary | ICD-10-CM | POA: Diagnosis not present

## 2021-09-07 DIAGNOSIS — Z86018 Personal history of other benign neoplasm: Secondary | ICD-10-CM | POA: Diagnosis not present

## 2021-09-07 DIAGNOSIS — L723 Sebaceous cyst: Secondary | ICD-10-CM | POA: Diagnosis not present

## 2021-09-07 DIAGNOSIS — Z23 Encounter for immunization: Secondary | ICD-10-CM | POA: Diagnosis not present

## 2021-09-07 DIAGNOSIS — L578 Other skin changes due to chronic exposure to nonionizing radiation: Secondary | ICD-10-CM | POA: Diagnosis not present

## 2021-09-07 DIAGNOSIS — L821 Other seborrheic keratosis: Secondary | ICD-10-CM | POA: Diagnosis not present

## 2021-09-15 ENCOUNTER — Encounter: Payer: Self-pay | Admitting: Family

## 2021-09-15 ENCOUNTER — Telehealth (INDEPENDENT_AMBULATORY_CARE_PROVIDER_SITE_OTHER): Payer: Medicare Other | Admitting: Family

## 2021-09-15 ENCOUNTER — Other Ambulatory Visit: Payer: Self-pay | Admitting: Family Medicine

## 2021-09-15 ENCOUNTER — Other Ambulatory Visit: Payer: Self-pay | Admitting: Physician Assistant

## 2021-09-15 VITALS — Ht 71.0 in | Wt 164.9 lb

## 2021-09-15 DIAGNOSIS — J209 Acute bronchitis, unspecified: Secondary | ICD-10-CM | POA: Diagnosis not present

## 2021-09-15 MED ORDER — PREDNISONE 20 MG PO TABS: ORAL_TABLET | ORAL | 0 refills | Status: DC

## 2021-09-15 MED ORDER — BENZONATATE 200 MG PO CAPS: 200.0000 mg | ORAL_CAPSULE | Freq: Three times a day (TID) | ORAL | 0 refills | Status: AC | PRN

## 2021-09-15 NOTE — Telephone Encounter (Signed)
Next Visit: 11/20/2021  Last Visit: 06/20/2021  Last Fill: 06/20/2021  DX:  Inflammatory arthritis   Current Dose per office note 06/20/2021: colchicine 0.6 mg 1 capsule by mouth daily  Labs: 06/28/2021 WBC 3.3, Glucose 206, Total Protein 5.9, AST 65, ALT 87  Okay to refill [facility]?

## 2021-09-15 NOTE — Progress Notes (Signed)
MyChart Video Visit    Virtual Visit via Video Note   This visit type was conducted due to national recommendations for restrictions regarding the COVID-19 Pandemic (e.g. social distancing) in an effort to limit this patient's exposure and mitigate transmission in our community. This patient is at least at moderate risk for complications without adequate follow up. This format is felt to be most appropriate for this patient at this time. Physical exam was limited by quality of the video and audio technology used for the visit. CMA was able to get the patient set up on a video visit.  Patient location: Home. Patient and provider in visit Provider location: Office  I discussed the limitations of evaluation and management by telemedicine and the availability of in person appointments. The patient expressed understanding and agreed to proceed.  Visit Date: 09/15/2021  Today's healthcare provider: [name], NP     Subjective:    Patient ID: Savannah [name], female    DOB: 07/21/1956, 65 y.o.   MRN: [id number]  Chief Complaint  Patient presents with   Cough   Nasal Congestion    Started 2 weeks ago. Taking Hydrocodone cough syrup. Tested for Covid 1 week ago, negative result. Denies fever.     HPI Cough: Patient complains of nasal congestion and productive cough.  Symptoms began 2 weeks ago.  The cough is productive of green/yellow sputum, with shortness of breath, chest is painful during coughing and is aggravated by nothing Associated symptoms include:change in voice and chest pain. Patient does have a history of asthma. Patient does not have a history of environmental allergens. Patient has not recent travel. Patient does have a history of smoking. Patient  has had a previous Chest X-ray w/pneumonia. Patient has not had a PPD done.     Past Medical History:  Diagnosis Date   ABDOMINAL PAIN, CHRONIC 10/20/2009   Acute cystitis 08/17/2009   ALLERGIC RHINITIS 11/21/2007    Allergy    ASYMPTOMATIC POSTMENOPAUSAL STATUS 09/29/2008   Bariatric surgery status 07/07/2010   Bariatric surgery status 07/07/2010   Qualifier: Diagnosis of  By: Loanne Drilling MD, Sean A    Cancer [facility])    uterine   Cataract    Coronary artery disease    DEPRESSION 11/21/2007   DIABETES MELLITUS, TYPE II 07/08/2007   DYSPNEA 05/20/2009   Edema 53/97/6734   Eosinophilic esophagitis 19/37/9024   FEVER UNSPECIFIED 10/20/2009   FEVER, HX OF 03/09/2010   GERD (gastroesophageal reflux disease)    Headache(784.0) 02/06/2010   Hepatomegaly 01/06/2008   HYPERLIPIDEMIA 07/08/2007   HYPERTENSION 07/08/2007   LEUKOPENIA, MILD 09/29/2008   OBESITY 01/06/2008   OSTEOARTHRITIS 01/06/2008   Other chronic nonalcoholic liver disease 09/73/5329   PERIPHERAL NEUROPATHY 11/21/2007   Peripheral neuropathy    Postsurgical hypothyroidism 09/19/2010   SINUSITIS- ACUTE-NOS 11/21/2007   TB SKIN TEST, POSITIVE 02/06/2010   THYROID NODULE 03/09/2010    Past Surgical History:  Procedure Laterality Date   ABDOMINAL HYSTERECTOMY     BASAL CELL CARCINOMA EXCISION     CARDIAC CATHETERIZATION     CHOLECYSTECTOMY N/A 05/11/2021   Procedure: LAPAROSCOPIC CHOLECYSTECTOMY WITH POSSIBLE  INTRAOPERATIVE CHOLANGIOGRAM;  Surgeon: Johnathan Hausen, MD;  Location[facility];  Service: General;  Laterality: N/A;   COLONOSCOPY     EYE SURGERY Bilateral 08/2018, 09/2018   FOOT SURGERY Left    10/2019   KNEE ARTHROSCOPY Left    LEFT HEART CATH AND CORONARY ANGIOGRAPHY N/A 01/20/2019   Procedure: LEFT HEART CATH  AND CORONARY ANGIOGRAPHY;  Surgeon: Belva Crome, MD;  Location: [place] [facility];  Service: Cardiovascular;  Laterality: N/A;   LYMPH NODE DISSECTION     OOPHORECTOMY     Right total knee replacement     ROUX-EN-Y GASTRIC BYPASS  2011   SKIN TAG REMOVAL     THYROIDECTOMY     TONSILLECTOMY AND ADENOIDECTOMY     UPPER GASTROINTESTINAL ENDOSCOPY      Outpatient Medications Prior to Visit  Medication Sig  Dispense Refill   acetaminophen (TYLENOL) 500 MG tablet Take 2 tablets (1,000 mg total) by mouth every 6 (six) hours as needed for mild pain or fever. 30 tablet 0   AMBULATORY NON FORMULARY MEDICATION Administrator at [employer]  Correct leg length and improve pronation BL 1 each 0   aspirin EC 81 MG tablet Take 1 tablet (81 mg total) by mouth daily. Starting 01/18/19 prior to cardiac cath     Cholecalciferol (VITAMIN D) 50 MCG (2000 UT) tablet Take 4,000 Units by mouth daily.     CLINPRO 5000 1.1 % PSTE See admin instructions.     Colchicine 0.6 MG CAPS Take 1 tablet by mouth daily. (Patient taking differently: Take 1 tablet by mouth daily. Does take another at HS if needed) 90 capsule 0   cyclobenzaprine (FLEXERIL) 10 MG tablet Take 10 mg by mouth at bedtime as needed for muscle spasms.     desloratadine (CLARINEX) 5 MG tablet Take 5 mg by mouth daily.     diclofenac sodium (VOLTAREN) 1 % GEL Apply 4 g topically 4 (four) times daily. 100 g 11   docusate sodium (COLACE) 100 MG capsule Take 200 mg by mouth 2 (two) times daily.     Dulaglutide (TRULICITY) 3 GU/4.4IH SOPN Inject 3 mg as directed once a week. 6 mL 3   Elastic Bandages & Supports (RIB BELT/WOMENS MEDIUM) MISC Use as needed for rib pain 1 each 0   Furosemide (LASIX PO) Take by mouth. Takes PRN     gabapentin (NEURONTIN) 300 MG capsule TAKE 3 CAPSULES BY MOUTH 3 TIMES DAILY. 360 capsule 2   halobetasol (ULTRAVATE) 0.05 % cream Apply 1 application topically 2 (two) times daily as needed (lichen sclerosus).   2   INVOKANA 300 MG TABS tablet TAKE 1 TABLET (300 MG TOTAL) BY MOUTH DAILY BEFORE BREAKFAST. 90 tablet 3   levothyroxine (SYNTHROID) 112 MCG tablet TAKE 1 TABLET BY MOUTH EVERY DAY 90 tablet 2   lidocaine (LIDODERM) 5 % PLACE 1 PATCH ONTO THE SKIN DAILY. REMOVE & DISCARD PATCH WITHIN 12 HOURS OR AS DIRECTED BY MD 30 patch 0   MAGNESIUM PO Take 4 tablets by mouth daily as needed (cramps).     metFORMIN (GLUCOPHAGE-XR) 500  MG 24 hr tablet Take 4 tablets (2,000 mg total) by mouth daily. 360 tablet 3   Multiple Vitamin (MULTIVITAMIN) tablet Take 1 tablet by mouth in the morning and at bedtime.     nitroGLYCERIN (NITROSTAT) 0.4 MG SL tablet Place 1 tablet (0.4 mg total) under the tongue every 5 (five) minutes as needed. 25 tablet 3   ONETOUCH ULTRA test strip USE TO MONITOR GLUCOSE LEVELS ONCE PER DAY E11.40 100 strip 2   OXYCODONE HCL PO Take by mouth as needed.     pantoprazole (PROTONIX) 40 MG tablet TAKE 1 TABLET BY MOUTH TWICE A DAY 180 tablet 1   Probiotic CAPS Take 1 capsule by mouth daily.     repaglinide (PRANDIN) 2  MG tablet TAKE 1 TABLET (2 MG TOTAL) BY MOUTH 2 (TWO) TIMES DAILY BEFORE A MEAL. 180 tablet 3   TRAMADOL HCL PO Take by mouth as needed.     TRULICITY 1.5 NI/7.7OE SOPN INJECT 1.5 MG INTO THE SKIN ONCE A WEEK. 6 mL 3   No facility-administered medications prior to visit.    Allergies  Allergen Reactions   Ace Inhibitors Cough   Caffeine     PVCs   Ciprofloxacin Itching   Morphine And Related Itching    Pt tolerates medication if given with diphenhydramine   Mucinex [Guaifenesin Er]     PVCs   Nsaids     Gastric Bypass Surgery - unable to take, tolerates ec 81 aspirin    Other     Antihistamine-alkylamine - PVCs tolerates benadryl    Silicone Hives   Tape Hives        Objective:     Physical Exam Vitals and nursing note reviewed.  Constitutional:      General: She is not in acute distress.    Appearance: pt appears ill. Voice is audibly hoarse. HENT:     Head: Normocephalic.     Eyes: left eye injection noted. Pulmonary:     Effort: No respiratory distress.  Musculoskeletal:     Cervical back: Normal range of motion.  Skin:    General: Skin is dry.     Coloration: Skin is not pale.  Neurological:     Mental Status: She is alert and oriented to person, place, and time.  Psychiatric:        Mood and Affect: Mood normal.   Ht 5' 11"  (1.803 m)   Wt 164 lb 14.5 oz  (74.8 kg)   BMI 23.00 kg/m   Wt Readings from Last 3 Encounters:  09/15/21 164 lb 14.5 oz (74.8 kg)  07/27/21 165 lb (74.8 kg)  06/28/21 160 lb (72.6 kg)       Assessment & Plan:    1. Acute bronchitis, unspecified organism Cough, SOB for 2 weeks, hx of covid in summer, pt has hx of asthma and pneumonia. Also c/o pleuritic pain, hoarseness.  - predniSONE (DELTASONE) 20 MG tablet; Take 2 pills in the morning with breakfast for 3 days, then 1 pill for 2 days  Dispense: 8 tablet; Refill: 0 - benzonatate (TESSALON) 200 MG capsule; Take 1 capsule (200 mg total) by mouth 3 (three) times daily as needed for up to 10 days for cough.  Dispense: 30 capsule; Refill: 0   Meds ordered this encounter  Medications   predniSONE (DELTASONE) 20 MG tablet    Sig: Take 2 pills in the morning with breakfast for 3 days, then 1 pill for 2 days    Dispense:  8 tablet    Refill:  0    Order Specific Question:   Supervising Provider    Answer:   ANDY, CAMILLE L [[id number]]   benzonatate (TESSALON) 200 MG capsule    Sig: Take 1 capsule (200 mg total) by mouth 3 (three) times daily as needed for up to 10 days for cough.    Dispense:  30 capsule    Refill:  0    Order Specific Question:   Supervising Provider    Answer:   ANDY, CAMILLE L [[id number]]    I discussed the assessment and treatment plan with the patient. The patient was provided an opportunity to ask questions and all were answered. The patient agreed with the plan and demonstrated an  understanding of the instructions.   The patient was advised to call back or seek an in-person evaluation if the symptoms worsen or if the condition fails to improve as anticipated.  I provided 23 minutes of face-to-face time during this encounter.   [name], NP [place] [phone] (phone) [phone] (fax)  [place]

## 2021-09-23 ENCOUNTER — Other Ambulatory Visit: Payer: Self-pay | Admitting: Endocrinology

## 2021-10-08 NOTE — Progress Notes (Signed)
Cardiology Office Note:    Date:  10/09/2021   ID:  Savannah [name], DOB Mar 20, 1956, MRN [id number]  PCP:  Vivi Barrack, [place] Cardiologist: Elouise Munroe, MD   Reason for visit: 57-monthfollow-up  History of Present Illness:    Savannah LABOis a 65y.o. female with a hx of  syncope/collapse, coronary artery disease, chronic diastolic CHF, type 2 diabetes, esophageal rings with multiple previous dilations, and bariatric surgery in 2011.  She had a history of syncope thought to be due to orthostasis/dehydration from lasix.   She has known significant three-vessel coronary artery disease with no superior lesions involving distal small vascular territories. She has had associated chest pain and has been medically managed.  She saw JColetta Memosin June 2022 and was doing well.  Today, she states she has occasional chest twinges that are brief and not worse with exertion.  She states this is similar to what she had before her LHC where she was found to have very distal disease.  She thinks she should restart her cholesterol medication which was stopped after her gallbladder surgery this year.  She states she has had a very tough year with COVID & gallbladder surgery.  She denies shortness of breath, lightheadedness and syncope.  Rare palpitations with a history of PVCs.  This was improved after she cut out caffeine use.  She states she used to be active with Silver sneakers and water aerobics but has not returned to a regular exercise routine yet.  She has chronic left lower extremity edema with multiple ortho surgeries, varicosities and lymphedema.  She wears compression stockings.    Past Medical History:  Diagnosis Date   ABDOMINAL PAIN, CHRONIC 10/20/2009   Acute cystitis 08/17/2009   ALLERGIC RHINITIS 11/21/2007   Allergy    ASYMPTOMATIC POSTMENOPAUSAL STATUS 09/29/2008   Bariatric surgery status 07/07/2010   Bariatric surgery status 07/07/2010   Qualifier:  Diagnosis of  By: ELoanne DrillingMD, Sean A    Cancer ([facility]    uterine   Cataract    Coronary artery disease    DEPRESSION 11/21/2007   DIABETES MELLITUS, TYPE II 07/08/2007   DYSPNEA 05/20/2009   Edema 084/69/6295  Eosinophilic esophagitis 028/41/3244  FEVER UNSPECIFIED 10/20/2009   FEVER, HX OF 03/09/2010   GERD (gastroesophageal reflux disease)    Headache(784.0) 02/06/2010   Hepatomegaly 01/06/2008   HYPERLIPIDEMIA 07/08/2007   HYPERTENSION 07/08/2007   LEUKOPENIA, MILD 09/29/2008   OBESITY 01/06/2008   OSTEOARTHRITIS 01/06/2008   Other chronic nonalcoholic liver disease 001/12/7251  PERIPHERAL NEUROPATHY 11/21/2007   Peripheral neuropathy    Postsurgical hypothyroidism 09/19/2010   SINUSITIS- ACUTE-NOS 11/21/2007   TB SKIN TEST, POSITIVE 02/06/2010   THYROID NODULE 03/09/2010    Past Surgical History:  Procedure Laterality Date   ABDOMINAL HYSTERECTOMY     BASAL CELL CARCINOMA EXCISION     CARDIAC CATHETERIZATION     CHOLECYSTECTOMY N/A 05/11/2021   Procedure: LAPAROSCOPIC CHOLECYSTECTOMY WITH POSSIBLE  INTRAOPERATIVE CHOLANGIOGRAM;  Surgeon: MJohnathan Hausen MD;  Location: [facility];  Service: General;  Laterality: N/A;   COLONOSCOPY     EYE SURGERY Bilateral 08/2018, 09/2018   FOOT SURGERY Left    10/2019   KNEE ARTHROSCOPY Left    LEFT HEART CATH AND CORONARY ANGIOGRAPHY N/A 01/20/2019   Procedure: LEFT HEART CATH AND CORONARY ANGIOGRAPHY;  Surgeon: SBelva Crome MD;  [facility];  Service: Cardiovascular;  Laterality: N/A;   LYMPH  NODE DISSECTION     OOPHORECTOMY     Right total knee replacement     ROUX-EN-Y GASTRIC BYPASS  2011   SKIN TAG REMOVAL     THYROIDECTOMY     TONSILLECTOMY AND ADENOIDECTOMY     UPPER GASTROINTESTINAL ENDOSCOPY      Current Medications: Current Meds  Medication Sig   acetaminophen (TYLENOL) 500 MG tablet Take 2 tablets (1,000 mg total) by mouth every 6 (six) hours as needed for mild pain or fever.   AMBULATORY NON  FORMULARY MEDICATION Administrator at [employer]  Correct leg length and improve pronation BL   aspirin EC 81 MG tablet Take 1 tablet (81 mg total) by mouth daily. Starting 01/18/19 prior to cardiac cath   atorvastatin (LIPITOR) 40 MG tablet Take 1 tablet (40 mg total) by mouth daily.   Cholecalciferol (VITAMIN D) 50 MCG (2000 UT) tablet Take 4,000 Units by mouth daily.   CLINPRO 5000 1.1 % PSTE See admin instructions.   cyclobenzaprine (FLEXERIL) 10 MG tablet Take 10 mg by mouth at bedtime as needed for muscle spasms.   desloratadine (CLARINEX) 5 MG tablet Take 5 mg by mouth daily.   diclofenac sodium (VOLTAREN) 1 % GEL Apply 4 g topically 4 (four) times daily.   docusate sodium (COLACE) 100 MG capsule Take 200 mg by mouth 2 (two) times daily.   Dulaglutide (TRULICITY) 3 KD/3.2IZ SOPN Inject 3 mg as directed once a week.   Elastic Bandages & Supports (RIB BELT/WOMENS MEDIUM) MISC Use as needed for rib pain   Furosemide (LASIX PO) Take by mouth. Takes PRN   gabapentin (NEURONTIN) 600 MG tablet gabapentin 600 mg tablet  TAKE 2 TABLETS (1,200 MG TOTAL) BY MOUTH 3 (THREE) TIMES DAILY.   halobetasol (ULTRAVATE) 0.05 % cream Apply 1 application topically 2 (two) times daily as needed (lichen sclerosus).    INVOKANA 300 MG TABS tablet TAKE 1 TABLET (300 MG TOTAL) BY MOUTH DAILY BEFORE BREAKFAST.   levothyroxine (SYNTHROID) 112 MCG tablet TAKE 1 TABLET BY MOUTH EVERY DAY   lidocaine (LIDODERM) 5 % PLACE 1 PATCH ONTO THE SKIN DAILY. REMOVE & DISCARD PATCH WITHIN 12 HOURS OR AS DIRECTED BY MD   MAGNESIUM PO Take 4 tablets by mouth daily as needed (cramps).   metFORMIN (GLUCOPHAGE-XR) 500 MG 24 hr tablet Take 4 tablets (2,000 mg total) by mouth daily.   MITIGARE 0.6 MG CAPS TAKE 1 CAPSULE BY MOUTH EVERY DAY   Multiple Vitamin (MULTIVITAMIN) tablet Take 1 tablet by mouth in the morning and at bedtime.   nitroGLYCERIN (NITROSTAT) 0.4 MG SL tablet Place 1 tablet (0.4 mg total) under the tongue  every 5 (five) minutes as needed.   ONETOUCH ULTRA test strip USE TO MONITOR GLUCOSE LEVELS ONCE PER DAY E11.40   OXYCODONE HCL PO Take by mouth as needed.   pantoprazole (PROTONIX) 40 MG tablet TAKE 1 TABLET BY MOUTH TWICE A DAY   Probiotic CAPS Take 1 capsule by mouth daily.   repaglinide (PRANDIN) 2 MG tablet TAKE 1 TABLET (2 MG TOTAL) BY MOUTH 2 (TWO) TIMES DAILY BEFORE A MEAL.   TRAMADOL HCL PO Take by mouth as needed.   [DISCONTINUED] predniSONE (DELTASONE) 20 MG tablet Take 2 pills in the morning with breakfast for 3 days, then 1 pill for 2 days     Allergies:   Ace inhibitors, Caffeine, Ciprofloxacin, Morphine and related, Mucinex [guaifenesin er], Nsaids, Other, Silicone, and Tape   Social History   Socioeconomic History  Marital status: Single    Spouse name: Not on file   Number of children: 0   Years of education: 16   Highest education level: Bachelor's degree (e.g., BA, AB, BS)  Occupational History   Occupation: Optometrist   Occupation: Retired  Tobacco Use   Smoking status: Former    Packs/day: 1.50    Years: 15.00    Pack years: 22.50    Types: Cigarettes    Quit date: 1992    Years since quitting: 30.9   Smokeless tobacco: Never  Vaping Use   Vaping Use: Never used  Substance and Sexual Activity   Alcohol use: Yes    Comment: rarely   Drug use: Never   Sexual activity: Not Currently  Other Topics Concern   Not on file  Social History Narrative   Not on file   Social Determinants of Health   Financial Resource Strain: Not on file  Food Insecurity: Not on file  Transportation Needs: Not on file  Physical Activity: Not on file  Stress: Not on file  Social Connections: Not on file     Family History: The patient's family history includes Bladder Cancer in her sister; Breast cancer in her maternal aunt and sister; Colon cancer in her maternal uncle; Congestive Heart Failure in her father; Diabetes in her father, maternal aunt, maternal grandmother,  maternal uncle, mother, and paternal grandmother; Hypertension in her father; Liver cancer in her maternal aunt; Multiple sclerosis in her sister; Myelodysplastic syndrome in her father; Neuropathy in her mother and paternal aunt; Osteoporosis in her sister.  ROS:   Please see the history of present illness.     EKGs/Labs/Other Studies Reviewed:    Recent Labs: 05/24/2021: TSH 4.98 06/28/2021: ALT 87; BUN 12; Creatinine, Ser 1.00; Hemoglobin 12.3; Platelets 175; Potassium 4.6; Sodium 144   Recent Lipid Panel Lab Results  Component Value Date/Time   CHOL 136 04/29/2019 01:36 PM   TRIG 102 04/29/2019 01:36 PM   HDL 47 04/29/2019 01:36 PM   LDLCALC 69 04/29/2019 01:36 PM   LDLDIRECT 99.0 10/12/2011 12:19 PM    Physical Exam:    VS:  BP 126/68   Pulse 78   Ht 5' 11"  (1.803 m)   Wt 183 lb (83 kg)   SpO2 96%   BMI 25.52 kg/m    No data found.  Wt Readings from Last 3 Encounters:  10/09/21 183 lb (83 kg)  09/15/21 164 lb 14.5 oz (74.8 kg)  07/27/21 165 lb (74.8 kg)     GEN:  Well nourished, well developed in no acute distress HEENT: Normal NECK: No JVD; No carotid bruits CARDIAC: RRR, no murmurs, rubs, gallops RESPIRATORY:  Clear to auscultation without rales, wheezing or rhonchi  ABDOMEN: Soft, non-tender, non-distended MUSCULOSKELETAL: 1+ LLE edema; No deformity  SKIN: Warm and dry NEUROLOGIC:  Alert and oriented PSYCHIATRIC:  Normal affect     ASSESSMENT AND PLAN   History of syncope, no recurrence -Secondary to low blood pressure/dehydration from Lasix -hypotension with metoprolol in the past -Continue compression stockings, cautious with medications.  Coronary artery disease -Occasional chest twinges, very mild, nonexertional.  Patient is not interested in Imdur/would be hesitant given her history of orthostatic hypotension.  Do not think stress testing/invasive work-up is indicated at this time. -Restart atorvastatin 40 mg daily.  Continue baby  aspirin. -Follow-up in 6 months.  Advised to call sooner if chest pain progresses.  Chronic diastolic heart failure, stable -Continue compression stockings, salt restriction and leg elevation  Hyperlipidemia -She was taken off her cholesterol medicine with her gallbladder surgery.  Check LFTs today.  Restart atorvastatin 40 mg daily.  Check fasting lipids in 3 months. -Discussed cholesterol lowering diets - Mediterranean diet, DASH diet, vegetarian diet, low-carbohydrate diet and avoidance of trans fats.  Discussed healthier choice substitutes.  Nuts, high-fiber foods, and fiber supplements may also improve lipids.    Disposition - Follow-up in 6 months with Dr. Ainsley Spinner.        Medication Adjustments/Labs and Tests Ordered: Current medicines are reviewed at length with the patient today.  Concerns regarding medicines are outlined above.  Orders Placed This Encounter  Procedures   Hepatic function panel   Lipid panel   Meds ordered this encounter  Medications   atorvastatin (LIPITOR) 40 MG tablet    Sig: Take 1 tablet (40 mg total) by mouth daily.    Dispense:  90 tablet    Refill:  3    Patient Instructions  Medication Instructions:  Start Lipitor 40 mg ( Take 1 tablet Daily). *If you need a refill on your cardiac medications before your next appointment, please call your pharmacy*   Lab Work: Hepatic Panel : Today. Lipid Panel ( To Be done In Three Months). If you have labs (blood work) drawn today and your tests are completely normal, you will receive your results only by: [place] (if you have MyChart) OR A paper copy in the mail If you have any lab test that is abnormal or we need to change your treatment, we will call you to review the results.   Testing/Procedures: No Testing   Follow-Up: At [facility], you and your health needs are our priority.  As part of our continuing mission to provide you with exceptional heart care, we have created  designated Provider Care Teams.  These Care Teams include your primary Cardiologist (physician) and Advanced Practice Providers (APPs -  Physician Assistants and Nurse Practitioners) who all work together to provide you with the care you need, when you need it.  We recommend signing up for the patient portal called "MyChart".  Sign up information is provided on this After Visit Summary.  MyChart is used to connect with patients for Virtual Visits (Telemedicine).  Patients are able to view lab/test results, encounter notes, upcoming appointments, etc.  Non-urgent messages can be sent to your provider as well.   To learn more about what you can do with MyChart, go to [email or web address].    Your next appointment:   6 month(s)  The format for your next appointment:   In Person  Provider:   Elouise Munroe, MD     Other Instructions Resume Low Impact Exercise .    Signed, Warren Lacy, PA-C  10/09/2021 11:59 AM    [facility]

## 2021-10-09 ENCOUNTER — Other Ambulatory Visit: Payer: Self-pay

## 2021-10-09 ENCOUNTER — Ambulatory Visit (INDEPENDENT_AMBULATORY_CARE_PROVIDER_SITE_OTHER): Payer: Medicare Other | Admitting: Physician Assistant

## 2021-10-09 ENCOUNTER — Encounter: Payer: Self-pay | Admitting: Physician Assistant

## 2021-10-09 VITALS — BP 126/68 | HR 78 | Ht 71.0 in | Wt 183.0 lb

## 2021-10-09 DIAGNOSIS — E785 Hyperlipidemia, unspecified: Secondary | ICD-10-CM | POA: Diagnosis not present

## 2021-10-09 DIAGNOSIS — R55 Syncope and collapse: Secondary | ICD-10-CM

## 2021-10-09 DIAGNOSIS — I5032 Chronic diastolic (congestive) heart failure: Secondary | ICD-10-CM | POA: Diagnosis not present

## 2021-10-09 DIAGNOSIS — E1169 Type 2 diabetes mellitus with other specified complication: Secondary | ICD-10-CM | POA: Diagnosis not present

## 2021-10-09 DIAGNOSIS — I2510 Atherosclerotic heart disease of native coronary artery without angina pectoris: Secondary | ICD-10-CM

## 2021-10-09 MED ORDER — ATORVASTATIN CALCIUM 40 MG PO TABS: 40.0000 mg | ORAL_TABLET | Freq: Every day | ORAL | 3 refills | Status: DC

## 2021-10-09 NOTE — Patient Instructions (Signed)
Medication Instructions:  Start Lipitor 40 mg ( Take 1 tablet Daily). *If you need a refill on your cardiac medications before your next appointment, please call your pharmacy*   Lab Work: Hepatic Panel : Today. Lipid Panel ( To Be done In Three Months). If you have labs (blood work) drawn today and your tests are completely normal, you will receive your results only by: [place] (if you have MyChart) OR A paper copy in the mail If you have any lab test that is abnormal or we need to change your treatment, we will call you to review the results.   Testing/Procedures: No Testing   Follow-Up: At [facility], you and your health needs are our priority.  As part of our continuing mission to provide you with exceptional heart care, we have created designated Provider Care Teams.  These Care Teams include your primary Cardiologist (physician) and Advanced Practice Providers (APPs -  Physician Assistants and Nurse Practitioners) who all work together to provide you with the care you need, when you need it.  We recommend signing up for the patient portal called "MyChart".  Sign up information is provided on this After Visit Summary.  MyChart is used to connect with patients for Virtual Visits (Telemedicine).  Patients are able to view lab/test results, encounter notes, upcoming appointments, etc.  Non-urgent messages can be sent to your provider as well.   To learn more about what you can do with MyChart, go to [email or web address].    Your next appointment:   6 month(s)  The format for your next appointment:   In Person  Provider:   Elouise Munroe, MD     Other Instructions Resume Low Impact Exercise .

## 2021-10-10 LAB — HEPATIC FUNCTION PANEL
ALT: 33 IU/L — ABNORMAL HIGH (ref 0–32)
AST: 27 IU/L (ref 0–40)
Albumin: 4 g/dL (ref 3.8–4.8)
Alkaline Phosphatase: 141 IU/L — ABNORMAL HIGH (ref 44–121)
Bilirubin Total: 0.4 mg/dL (ref 0.0–1.2)
Bilirubin, Direct: 0.11 mg/dL (ref 0.00–0.40)
Total Protein: 5.8 g/dL — ABNORMAL LOW (ref 6.0–8.5)

## 2021-10-12 ENCOUNTER — Telehealth: Payer: Self-pay

## 2021-10-12 NOTE — Telephone Encounter (Addendum)
Left message for patient to call office regarding results----- Message from Warren Lacy, PA-C sent at 10/10/2021  1:27 PM EST ----- LFTs minimally elevated.  Greatly improved from summer.  Safe to start statin therapy.  Can recheck LFTs in 3 months.

## 2021-10-16 ENCOUNTER — Other Ambulatory Visit: Payer: Self-pay

## 2021-10-16 ENCOUNTER — Ambulatory Visit (INDEPENDENT_AMBULATORY_CARE_PROVIDER_SITE_OTHER): Payer: Medicare Other | Admitting: Endocrinology

## 2021-10-16 VITALS — BP 120/70 | HR 75 | Ht 71.0 in | Wt 178.0 lb

## 2021-10-16 DIAGNOSIS — E1140 Type 2 diabetes mellitus with diabetic neuropathy, unspecified: Secondary | ICD-10-CM | POA: Diagnosis not present

## 2021-10-16 DIAGNOSIS — I2510 Atherosclerotic heart disease of native coronary artery without angina pectoris: Secondary | ICD-10-CM | POA: Diagnosis not present

## 2021-10-16 LAB — POCT GLYCOSYLATED HEMOGLOBIN (HGB A1C): Hemoglobin A1C: 8.7 % — AB (ref 4.0–5.6)

## 2021-10-16 MED ORDER — REPAGLINIDE 2 MG PO TABS: 2.0000 mg | ORAL_TABLET | Freq: Three times a day (TID) | ORAL | 3 refills | Status: DC

## 2021-10-16 MED ORDER — TRULICITY 4.5 MG/0.5ML ~~LOC~~ SOAJ: 4.5000 mg | SUBCUTANEOUS | 3 refills | Status: DC

## 2021-10-16 NOTE — Patient Instructions (Addendum)
Please continue the same metformin.   I have sent prescriptions to your pharmacy, to increase the repaglinide and Trulicity. Try taking a full pill of Invokana check your blood sugar once a day.  vary the time of day when you check, between before the 3 meals, and at bedtime.  also check if you have symptoms of your blood sugar being too high or too low.  please keep a record of the readings and bring it to your next appointment here (or you can bring the meter itself).  You can write it on any piece of paper.  please call us sooner if your blood sugar goes below 70, or if you have a lot of readings over 200.   Please come back for a follow-up appointment in 3 months.

## 2021-10-16 NOTE — Progress Notes (Signed)
Subjective:    Patient ID: Savannah [name], female    DOB: 24-Sep-1956, 65 y.o.   MRN: [id number]  HPI Pt returns for f/u of diabetes mellitus:  DM type: 2 Dx'ed: 2440 Complications: PPN, and CAD (seen on CT).   Therapy: Trulicity and 3 oral meds.   GDM: never DKA: never Severe hypoglycemia: never.   Pancreatitis: never.  Other: she took insulin from 2004-2011, when she had gastric bypass surgery.  She is retired; edema limits rx options.   Interval history: She seldom checks cbg.  She was on prednisone last month, and steroid injections prior.  She reduced the Invokana to 1/2 pill per day, due to low BP.   Past Medical History:  Diagnosis Date   ABDOMINAL PAIN, CHRONIC 10/20/2009   Acute cystitis 08/17/2009   ALLERGIC RHINITIS 11/21/2007   Allergy    ASYMPTOMATIC POSTMENOPAUSAL STATUS 09/29/2008   Bariatric surgery status 07/07/2010   Bariatric surgery status 07/07/2010   Qualifier: Diagnosis of  By: Loanne Drilling MD, Staria Birkhead A    Cancer [facility])    uterine   Cataract    Coronary artery disease    DEPRESSION 11/21/2007   DIABETES MELLITUS, TYPE II 07/08/2007   DYSPNEA 05/20/2009   Edema 09/08/2535   Eosinophilic esophagitis 64/40/3474   FEVER UNSPECIFIED 10/20/2009   FEVER, HX OF 03/09/2010   GERD (gastroesophageal reflux disease)    Headache(784.0) 02/06/2010   Hepatomegaly 01/06/2008   HYPERLIPIDEMIA 07/08/2007   HYPERTENSION 07/08/2007   LEUKOPENIA, MILD 09/29/2008   OBESITY 01/06/2008   OSTEOARTHRITIS 01/06/2008   Other chronic nonalcoholic liver disease 25/95/6387   PERIPHERAL NEUROPATHY 11/21/2007   Peripheral neuropathy    Postsurgical hypothyroidism 09/19/2010   SINUSITIS- ACUTE-NOS 11/21/2007   TB SKIN TEST, POSITIVE 02/06/2010   THYROID NODULE 03/09/2010    Past Surgical History:  Procedure Laterality Date   ABDOMINAL HYSTERECTOMY     BASAL CELL CARCINOMA EXCISION     CARDIAC CATHETERIZATION     CHOLECYSTECTOMY N/A 05/11/2021   Procedure: LAPAROSCOPIC  CHOLECYSTECTOMY WITH POSSIBLE  INTRAOPERATIVE CHOLANGIOGRAM;  Surgeon: Johnathan Hausen, MD;  Location: [facility];  Service: General;  Laterality: N/A;   COLONOSCOPY     EYE SURGERY Bilateral 08/2018, 09/2018   FOOT SURGERY Left    10/2019   KNEE ARTHROSCOPY Left    LEFT HEART CATH AND CORONARY ANGIOGRAPHY N/A 01/20/2019   Procedure: LEFT HEART CATH AND CORONARY ANGIOGRAPHY;  Surgeon: Belva Crome, MD;  Location: [facility];  Service: Cardiovascular;  Laterality: N/A;   LYMPH NODE DISSECTION     OOPHORECTOMY     Right total knee replacement     ROUX-EN-Y GASTRIC BYPASS  2011   SKIN TAG REMOVAL     THYROIDECTOMY     TONSILLECTOMY AND ADENOIDECTOMY     UPPER GASTROINTESTINAL ENDOSCOPY      Social History   Socioeconomic History   Marital status: Single    Spouse name: Not on file   Number of children: 0   Years of education: 16   Highest education level: Bachelor's degree (e.g., BA, AB, BS)  Occupational History   Occupation: Optometrist   Occupation: Retired  Tobacco Use   Smoking status: Former    Packs/day: 1.50    Years: 15.00    Pack years: 22.50    Types: Cigarettes    Quit date: 1992    Years since quitting: 30.9   Smokeless tobacco: Never  Vaping Use   Vaping Use: Never used  Substance and Sexual  Activity   Alcohol use: Yes    Comment: rarely   Drug use: Never   Sexual activity: Not Currently  Other Topics Concern   Not on file  Social History Narrative   Not on file   Social Determinants of Health   Financial Resource Strain: Not on file  Food Insecurity: Not on file  Transportation Needs: Not on file  Physical Activity: Not on file  Stress: Not on file  Social Connections: Not on file  Intimate Partner Violence: Not on file    Current Outpatient Medications on File Prior to Visit  Medication Sig Dispense Refill   acetaminophen (TYLENOL) 500 MG tablet Take 2 tablets (1,000 mg total) by mouth every 6 (six) hours as needed for mild pain or  fever. 30 tablet 0   AMBULATORY NON FORMULARY MEDICATION Administrator at [employer]  Correct leg length and improve pronation BL 1 each 0   aspirin EC 81 MG tablet Take 1 tablet (81 mg total) by mouth daily. Starting 01/18/19 prior to cardiac cath     atorvastatin (LIPITOR) 40 MG tablet Take 1 tablet (40 mg total) by mouth daily. 90 tablet 3   Cholecalciferol (VITAMIN D) 50 MCG (2000 UT) tablet Take 4,000 Units by mouth daily.     CLINPRO 5000 1.1 % PSTE See admin instructions.     cyclobenzaprine (FLEXERIL) 10 MG tablet Take 10 mg by mouth at bedtime as needed for muscle spasms.     desloratadine (CLARINEX) 5 MG tablet Take 5 mg by mouth daily.     diclofenac sodium (VOLTAREN) 1 % GEL Apply 4 g topically 4 (four) times daily. 100 g 11   docusate sodium (COLACE) 100 MG capsule Take 200 mg by mouth 2 (two) times daily.     Elastic Bandages & Supports (RIB BELT/WOMENS MEDIUM) MISC Use as needed for rib pain 1 each 0   Furosemide (LASIX PO) Take by mouth. Takes PRN     gabapentin (NEURONTIN) 600 MG tablet gabapentin 600 mg tablet  TAKE 2 TABLETS (1,200 MG TOTAL) BY MOUTH 3 (THREE) TIMES DAILY.     halobetasol (ULTRAVATE) 0.05 % cream Apply 1 application topically 2 (two) times daily as needed (lichen sclerosus).   2   INVOKANA 300 MG TABS tablet TAKE 1 TABLET (300 MG TOTAL) BY MOUTH DAILY BEFORE BREAKFAST. 90 tablet 3   levothyroxine (SYNTHROID) 112 MCG tablet TAKE 1 TABLET BY MOUTH EVERY DAY 90 tablet 2   lidocaine (LIDODERM) 5 % PLACE 1 PATCH ONTO THE SKIN DAILY. REMOVE & DISCARD PATCH WITHIN 12 HOURS OR AS DIRECTED BY MD 30 patch 0   MAGNESIUM PO Take 4 tablets by mouth daily as needed (cramps).     metFORMIN (GLUCOPHAGE-XR) 500 MG 24 hr tablet Take 4 tablets (2,000 mg total) by mouth daily. 360 tablet 3   MITIGARE 0.6 MG CAPS TAKE 1 CAPSULE BY MOUTH EVERY DAY 90 capsule 0   Multiple Vitamin (MULTIVITAMIN) tablet Take 1 tablet by mouth in the morning and at bedtime.     nitroGLYCERIN  (NITROSTAT) 0.4 MG SL tablet Place 1 tablet (0.4 mg total) under the tongue every 5 (five) minutes as needed. 25 tablet 3   ONETOUCH ULTRA test strip USE TO MONITOR GLUCOSE LEVELS ONCE PER DAY E11.40 100 strip 2   OXYCODONE HCL PO Take by mouth as needed.     pantoprazole (PROTONIX) 40 MG tablet TAKE 1 TABLET BY MOUTH TWICE A DAY 180 tablet 1   Probiotic CAPS  Take 1 capsule by mouth daily.     TRAMADOL HCL PO Take by mouth as needed.     No current facility-administered medications on file prior to visit.    Allergies  Allergen Reactions   Ace Inhibitors Cough   Caffeine     PVCs   Ciprofloxacin Itching   Morphine And Related Itching    Pt tolerates medication if given with diphenhydramine   Mucinex [Guaifenesin Er]     PVCs   Nsaids     Gastric Bypass Surgery - unable to take, tolerates ec 81 aspirin    Other     Antihistamine-alkylamine - PVCs tolerates benadryl    Silicone Hives   Tape Hives    Family History  Problem Relation Age of Onset   Diabetes Mother    Neuropathy Mother    Diabetes Father    Congestive Heart Failure Father    Hypertension Father    Myelodysplastic syndrome Father    Bladder Cancer Sister    Multiple sclerosis Sister    Breast cancer Sister        breast onset age 59   Osteoporosis Sister    Liver cancer Maternal Aunt    Breast cancer Maternal Aunt    Diabetes Maternal Aunt    Colon cancer Maternal Uncle    Diabetes Maternal Uncle    Neuropathy Paternal Aunt    Diabetes Maternal Grandmother    Diabetes Paternal Grandmother     BP 120/70   Pulse 75   Ht 5' 11"  (1.803 m)   Wt 178 lb (80.7 kg)   SpO2 97%   BMI 24.83 kg/m    Review of Systems Denies N/HB.  She denies hypoglycemia.      Objective:   Physical Exam  Lab Results  Component Value Date   TSH 4.98 05/24/2021   Lab Results  Component Value Date   CREATININE 1.00 06/28/2021   BUN 12 06/28/2021   NA 144 06/28/2021   K 4.6 06/28/2021   CL 104 06/28/2021   CO2 26  06/28/2021    Lab Results  Component Value Date   HGBA1C 8.7 (A) 10/16/2021      Assessment & Plan:  Type 2 DM: uncontrolled.   Patient Instructions  Please continue the same metformin.   I have sent prescriptions to your pharmacy, to increase the repaglinide and Trulicity. Try taking a full pill of Invokana check your blood sugar once a day.  vary the time of day when you check, between before the 3 meals, and at bedtime.  also check if you have symptoms of your blood sugar being too high or too low.  please keep a record of the readings and bring it to your next appointment here (or you can bring the meter itself).  You can write it on any piece of paper.  please call us sooner if your blood sugar goes below 70, or if you have a lot of readings over 200.   Please come back for a follow-up appointment in 3 months.

## 2021-10-18 ENCOUNTER — Telehealth: Payer: Self-pay

## 2021-10-18 ENCOUNTER — Other Ambulatory Visit: Payer: Self-pay | Admitting: Endocrinology

## 2021-10-18 NOTE — Telephone Encounter (Signed)
Pt returning phone call... please advise  

## 2021-10-19 NOTE — Telephone Encounter (Signed)
Called patient regarding results. Patient understood instructions to start statins and repeat blood work in 3 months.

## 2021-10-20 NOTE — Telephone Encounter (Signed)
Colletta [place] CMA notified patient

## 2021-11-07 ENCOUNTER — Telehealth: Payer: Self-pay | Admitting: Family Medicine

## 2021-11-07 NOTE — Progress Notes (Signed)
Office Visit Note  Patient: Savannah [name]             Date of Birth: 1956-08-10           MRN: [id number]             PCP: Vivi Barrack, MD Referring: Vivi Barrack, MD Visit Date: 11/20/2021 Occupation: @GUAROCC @  Subjective:  Pain in multiple joints.   History of Present Illness: NAUTICA HOTZ is a 65 y.o. female with a history of osteoarthritis,, degenerative disc disease, inflammatory arthritis and osteoporosis.  She continues to have pain and discomfort in  bilateral hands, bilateral knee joints and her bilateral feet.  Her right knee joint is replaced.  She has been followed by Dr. Erlinda Hong for osteoarthritis.  She was evaluated by Dr. Jaynee Eagles for neck and lower back pain.  The MRIs done in August were consistent with degenerative disc disease.  She came to discuss DEXA results today.  Activities of Daily Living:  Patient reports morning stiffness for a few hours.   Patient Reports nocturnal pain.  Difficulty dressing/grooming: Reports Difficulty climbing stairs: Reports Difficulty getting out of chair: Reports Difficulty using hands for taps, buttons, cutlery, and/or writing: Reports  Review of Systems  Constitutional:  Positive for fatigue.  HENT:  Positive for mouth dryness and nose dryness. Negative for mouth sores.   Eyes:  Positive for dryness. Negative for pain and itching.  Respiratory:  Negative for shortness of breath and difficulty breathing.   Cardiovascular:  Negative for chest pain and palpitations.  Gastrointestinal:  Positive for constipation and diarrhea. Negative for blood in stool.  Endocrine: Negative for increased urination.  Genitourinary:  Negative for difficulty urinating.  Musculoskeletal:  Positive for joint pain, joint pain, joint swelling, myalgias, morning stiffness and myalgias. Negative for muscle tenderness.  Skin:  Negative for color change, rash and redness.  Allergic/Immunologic: Negative for susceptible to infections.  Neurological:  Positive  for numbness and weakness. Negative for dizziness, headaches and memory loss.  Hematological:  Positive for bruising/bleeding tendency.  Psychiatric/Behavioral:  Negative for confusion.    PMFS History:  Patient Active Problem List   Diagnosis Date Noted   Acute bronchitis 09/15/2021   Nondisplaced comminuted fracture of left patella, initial encounter for closed fracture 08/01/2021   Arthritis of carpometacarpal [facility]) joint of right thumb 07/28/2021   Primary osteoarthritis of right distal radioulnar joint 07/28/2021   Arthritis of wrist, right, degenerative 07/28/2021   Osteopenia 01/14/2021   Sesamoiditis of left foot 11/09/2020   Posterior tibial tendinitis of left lower extremity 11/09/2020   Type 2 diabetes mellitus with diabetic peripheral angiopathy without gangrene, with long-term current use of insulin ([place]) 10/21/2020   Iron deficiency anemia 03/14/2020   Deficiency anemia 02/29/2020   Lesion of vulva 09/02/2019   Menorrhagia 09/02/2019   Neuropathy 09/02/2019   Irregular bowel habits 06/25/2019   Coronary artery disease involving native coronary artery of native heart without angina pectoris    Temporomandibular joint disorder 12/05/2018   Fatigue 12/05/2018   Inverted nipple 12/05/2018   Senile purpura ([place]) 11/21/2018   GERD with esophagitis 11/21/2018   Pseudogout involving multiple joints 11/21/2018   Lipoma 27/01/5008   Lichen sclerosus 38/18/2993   H/O gastric bypass 11/16/2016   GERD (gastroesophageal reflux disease) 11/16/2016   Depression 11/16/2016   Diabetic polyneuropathy associated with type 2 diabetes mellitus ([place]) 11/16/2016   Uterine cancer [facility]) s/p hysterectomy 1997 01/04/2012   Postsurgical hypothyroidism 09/19/2010   Diabetic neuropathy (  [place]) 11/21/2007   Allergic rhinitis 11/21/2007   Dyslipidemia associated with type 2 diabetes mellitus ([place]) 07/08/2007    Past Medical History:  Diagnosis Date   ABDOMINAL PAIN, CHRONIC 10/20/2009   Acute  cystitis 08/17/2009   ALLERGIC RHINITIS 11/21/2007   Allergy    ASYMPTOMATIC POSTMENOPAUSAL STATUS 09/29/2008   Bariatric surgery status 07/07/2010   Bariatric surgery status 07/07/2010   Qualifier: Diagnosis of  By: Loanne Drilling MD, Sean A    Cancer [facility])    uterine   Cataract    Coronary artery disease    DEPRESSION 11/21/2007   DIABETES MELLITUS, TYPE II 07/08/2007   DYSPNEA 05/20/2009   Edema 47/65/4650   Eosinophilic esophagitis 35/46/5681   FEVER UNSPECIFIED 10/20/2009   FEVER, HX OF 03/09/2010   GERD (gastroesophageal reflux disease)    Headache(784.0) 02/06/2010   Hepatomegaly 01/06/2008   HYPERLIPIDEMIA 07/08/2007   HYPERTENSION 07/08/2007   LEUKOPENIA, MILD 09/29/2008   OBESITY 01/06/2008   OSTEOARTHRITIS 01/06/2008   Other chronic nonalcoholic liver disease 27/51/7001   PERIPHERAL NEUROPATHY 11/21/2007   Peripheral neuropathy    Postsurgical hypothyroidism 09/19/2010   SINUSITIS- ACUTE-NOS 11/21/2007   TB SKIN TEST, POSITIVE 02/06/2010   THYROID NODULE 03/09/2010    Family History  Problem Relation Age of Onset   Diabetes Mother    Neuropathy Mother    Diabetes Father    Congestive Heart Failure Father    Hypertension Father    Myelodysplastic syndrome Father    Bladder Cancer Sister    Multiple sclerosis Sister    Breast cancer Sister        breast onset age 24   Osteoporosis Sister    Liver cancer Maternal Aunt    Breast cancer Maternal Aunt    Diabetes Maternal Aunt    Colon cancer Maternal Uncle    Diabetes Maternal Uncle    Neuropathy Paternal Aunt    Diabetes Maternal Grandmother    Diabetes Paternal Grandmother    Past Surgical History:  Procedure Laterality Date   ABDOMINAL HYSTERECTOMY     BASAL CELL CARCINOMA EXCISION     CARDIAC CATHETERIZATION     CHOLECYSTECTOMY N/A 05/11/2021   Procedure: LAPAROSCOPIC CHOLECYSTECTOMY WITH POSSIBLE  INTRAOPERATIVE CHOLANGIOGRAM;  Surgeon: Johnathan Hausen, MD;  Location: [facility];  Service: General;   Laterality: N/A;   COLONOSCOPY     EYE SURGERY Bilateral 08/2018, 09/2018   FOOT SURGERY Left    10/2019   KNEE ARTHROSCOPY Left    LEFT HEART CATH AND CORONARY ANGIOGRAPHY N/A 01/20/2019   Procedure: LEFT HEART CATH AND CORONARY ANGIOGRAPHY;  Surgeon: Belva Crome, MD;  Location: [facility];  Service: Cardiovascular;  Laterality: N/A;   LYMPH NODE DISSECTION     OOPHORECTOMY     Right total knee replacement     ROUX-EN-Y GASTRIC BYPASS  2011   SKIN TAG REMOVAL     THYROIDECTOMY     TONSILLECTOMY AND ADENOIDECTOMY     UPPER GASTROINTESTINAL ENDOSCOPY     Social History   Social History Narrative   Not on file   Immunization History  Administered Date(s) Administered   Fluad Quad(high Dose 65+) 07/13/2019   Influenza Inj Mdck Quad Pf 10/19/2017, 07/23/2018   Influenza Whole 07/30/2008, 08/17/2009, 09/19/2010   Influenza,inj,Quad PF,6-35 Mos 07/14/2015   Influenza-Unspecified 10/13/2011, 08/12/2013, 07/13/2017, 07/23/2018   PFIZER(Purple Top)SARS-COV-2 Vaccination 12/21/2019, 01/14/2020, 08/18/2020   Pneumococcal Conjugate-13 10/14/2015   Pneumococcal Polysaccharide-23 06/25/2014, 10/21/2020   Tdap 05/20/2020   Zoster Recombinat (Shingrix) 05/20/2020  Zoster, Live 06/25/2014     Objective: Vital Signs: BP 121/74 (BP Location: Left Arm, Patient Position: Sitting, Cuff Size: Normal)    Pulse 69    Ht 5\' 11"  (1.803 m)    Wt 174 lb (78.9 kg)    BMI 24.27 kg/m    Physical Exam Vitals and nursing note reviewed.  Constitutional:      Appearance: She is well-developed.  HENT:     Head: Normocephalic and atraumatic.  Eyes:     Conjunctiva/sclera: Conjunctivae normal.  Cardiovascular:     Rate and Rhythm: Normal rate and regular rhythm.     Heart sounds: Normal heart sounds.  Pulmonary:     Effort: Pulmonary effort is normal.     Breath sounds: Normal breath sounds.  Abdominal:     General: Bowel sounds are normal.     Palpations: Abdomen is soft.   Musculoskeletal:     Cervical back: Normal range of motion.  Lymphadenopathy:     Cervical: No cervical adenopathy.  Skin:    General: Skin is warm and dry.     Capillary Refill: Capillary refill takes less than 2 seconds.  Neurological:     Mental Status: She is alert and oriented to person, place, and time.  Psychiatric:        Behavior: Behavior normal.     Musculoskeletal Exam: She had good range motion of the cervical spine with stiffness.  She had painful limited range of motion of the lumbar spine.  Shoulder joints and elbow joints with good range of motion.  She had bilateral PIP and DIP thickening with tenderness over left index finger DIP joint.  Hip joints in good range of motion.  Right knee joint was replaced with some warmth.  She had painful range of motion of her left Ro joint without any swelling.  There was no tenderness over ankles or MTPs.  CDAI Exam: CDAI Score: -- Patient Global: --; Provider Global: -- Swollen: --; Tender: -- Joint Exam 11/20/2021   No joint exam has been documented for this visit   There is currently no information documented on the homunculus. Go to the Rheumatology activity and complete the homunculus joint exam.  Investigation: No additional findings.  Imaging: No results found.  Recent Labs: Lab Results  Component Value Date   WBC 3.3 (L) 06/28/2021   HGB 12.3 06/28/2021   PLT 175 06/28/2021   NA 144 06/28/2021   K 4.6 06/28/2021   CL 104 06/28/2021   CO2 26 06/28/2021   GLUCOSE 206 (H) 06/28/2021   BUN 12 06/28/2021   CREATININE 1.00 06/28/2021   BILITOT 0.4 10/09/2021   ALKPHOS 141 (H) 10/09/2021   AST 27 10/09/2021   ALT 33 (H) 10/09/2021   PROT 5.8 (L) 10/09/2021   ALBUMIN 4.0 10/09/2021   CALCIUM 8.8 06/28/2021   GFRAA 73 08/16/2020    Speciality Comments: No specialty comments available.  Procedures:  No procedures performed Allergies: Ace inhibitors, Caffeine, Ciprofloxacin, Morphine and related, Mucinex  [guaifenesin er], Nsaids, Other, Silicone, and Tape   Assessment / Plan:     Visit Diagnoses: Inflammatory arthritis - RF-, anti-CCP-, 14-3-3 eta-, ANA-, HLAB27-, uric acid 3.8. Responsive to colchicine-likely she has pseudogout: colchicine 0.6 mg 1 capsule by mouth daily.  She has done well on colchicine and will continue the medication.  Medication monitoring encounter-her last labs from 03/28/2021 showed white cell count of 3.3.  LFTs have been gradually improving.  We will check labs today which will  include CBC with differential and CMP with GFR.  Primary osteoarthritis of both hands-she has severe osteoarthritis in her bilateral hands with DIP and PIP thickening.  Joint protection muscle strengthening was discussed.  A handout on exercises was given.  Trochanteric bursitis of both hips-she is off-and-on discomfort in her bilateral trochanteric bursa.  Active restrictions were emphasized.  Status post total right knee replacement - Performed by Dr. Veverly Fells.  She has ongoing pain and discomfort.  Primary osteoarthritis of left knee-she is followed by Dr.Xu.  Patient states that she easily falls.  She has done physical therapy in the past.  I gave her a handout on lower extremity muscle strengthening exercises.  I also encouraged her to do another course of physical therapy which she can discuss with Dr. Erlinda Hong.  Primary osteoarthritis of both feet-she has ongoing discomfort in her feet.  DDD cervical spine-she was evaluated by Dr.Ahern.  I reviewed her MRI from July 07, 2021 which showed degenerative changes.  She has ongoing stiffness and discomfort in her neck.  DDD lumbar spine-MRI done by Dr. Jaynee Eagles from July 07, 2021 showed degenerative changes and facet joint arthropathy.  She has chronic pain and discomfort.  Age-related osteoporosis without current pathological fracture - DEXA updated on 01/11/21: Right femoral neck- T-score -2.5 and a -25.9% change in BMD from previous DEXA.  I had a  detailed discussion with the patient regarding the treatment options.  She needs a dental implant.  She does not want to start on any medications until she gets dental implant.  Use of calcium rich diet and vitamin D supplement was discussed.  Vitamin D deficiency-her last vitamin D level was normal.  She states she has been taking vitamin D supplement.  Other medical problems are listed as follows:  Status post gastric bypass for obesity  History of gastroesophageal reflux (GERD)  Eosinophilic esophagitis  Postsurgical hypothyroidism  Status post cholecystectomy  History of hyperlipidemia  Positive PPD  History of uterine cancer  NASH (nonalcoholic steatohepatitis)  History of peripheral neuropathy - gabapentin 300 mg 4 capsules by mouth daily for management of neuropathy.   History of diabetes mellitus, type II  Orders: Orders Placed This Encounter  Procedures   CBC with Differential/Platelet   COMPLETE METABOLIC PANEL WITH GFR   Meds ordered this encounter  Medications   Colchicine (MITIGARE) 0.6 MG CAPS    Sig: Take 1 tablet by mouth daily.    Dispense:  90 capsule    Refill:  0     Follow-Up Instructions: Return in about 6 months (around 05/20/2022) for Osteoarthritis,CPPD, OP.   Bo Merino, MD  Note - This record has been created using Editor, commissioning.  Chart creation errors have been sought, but may not always  have been located. Such creation errors do not reflect on  the standard of medical care.

## 2021-11-07 NOTE — Chronic Care Management (AMB) (Signed)
°  Care Management   Follow Up Note   11/07/2021 Name: Savannah [name] MRN: [id number] DOB: [birth year]   Referred by: Vivi Barrack, MD Reason for referral : No chief complaint on file.   Successful contact was made with the patient to discuss care management and care coordination services. Patient declines engagement at this time.   Follow Up Plan: No further follow up required:    [place]

## 2021-11-11 ENCOUNTER — Other Ambulatory Visit: Payer: Self-pay | Admitting: Physician Assistant

## 2021-11-11 ENCOUNTER — Other Ambulatory Visit: Payer: Self-pay | Admitting: Endocrinology

## 2021-11-11 ENCOUNTER — Other Ambulatory Visit: Payer: Self-pay | Admitting: Family Medicine

## 2021-11-12 HISTORY — PX: TOOTH EXTRACTION: SUR596

## 2021-11-15 ENCOUNTER — Encounter: Payer: Self-pay | Admitting: Family Medicine

## 2021-11-16 NOTE — Telephone Encounter (Signed)
See note

## 2021-11-17 NOTE — Telephone Encounter (Signed)
Patient stated was taking 1200 mg QID, patient was informed   3600 is the max dose,  Patient stated her friends husband is taking Gabapentin QID  Advise to schedule appointment with PCP for further questions

## 2021-11-17 NOTE — Telephone Encounter (Signed)
3600mg  total daily is the max dose. We cannot send in for 1200mg  four times daily.  Please clarify dosage with patient. Recommend office visit if she has further questions.

## 2021-11-20 ENCOUNTER — Ambulatory Visit (INDEPENDENT_AMBULATORY_CARE_PROVIDER_SITE_OTHER): Payer: Medicare Other | Admitting: Rheumatology

## 2021-11-20 ENCOUNTER — Encounter: Payer: Self-pay | Admitting: Rheumatology

## 2021-11-20 ENCOUNTER — Other Ambulatory Visit: Payer: Self-pay

## 2021-11-20 VITALS — BP 121/74 | HR 69 | Ht 71.0 in | Wt 174.0 lb

## 2021-11-20 DIAGNOSIS — Z96651 Presence of right artificial knee joint: Secondary | ICD-10-CM | POA: Diagnosis not present

## 2021-11-20 DIAGNOSIS — M810 Age-related osteoporosis without current pathological fracture: Secondary | ICD-10-CM | POA: Diagnosis not present

## 2021-11-20 DIAGNOSIS — M5030 Other cervical disc degeneration, unspecified cervical region: Secondary | ICD-10-CM | POA: Diagnosis not present

## 2021-11-20 DIAGNOSIS — M7062 Trochanteric bursitis, left hip: Secondary | ICD-10-CM

## 2021-11-20 DIAGNOSIS — K200 Eosinophilic esophagitis: Secondary | ICD-10-CM

## 2021-11-20 DIAGNOSIS — Z5181 Encounter for therapeutic drug level monitoring: Secondary | ICD-10-CM | POA: Diagnosis not present

## 2021-11-20 DIAGNOSIS — R7611 Nonspecific reaction to tuberculin skin test without active tuberculosis: Secondary | ICD-10-CM

## 2021-11-20 DIAGNOSIS — M1712 Unilateral primary osteoarthritis, left knee: Secondary | ICD-10-CM

## 2021-11-20 DIAGNOSIS — M1990 Unspecified osteoarthritis, unspecified site: Secondary | ICD-10-CM | POA: Diagnosis not present

## 2021-11-20 DIAGNOSIS — M19042 Primary osteoarthritis, left hand: Secondary | ICD-10-CM

## 2021-11-20 DIAGNOSIS — Z8669 Personal history of other diseases of the nervous system and sense organs: Secondary | ICD-10-CM

## 2021-11-20 DIAGNOSIS — K7581 Nonalcoholic steatohepatitis (NASH): Secondary | ICD-10-CM

## 2021-11-20 DIAGNOSIS — M19071 Primary osteoarthritis, right ankle and foot: Secondary | ICD-10-CM

## 2021-11-20 DIAGNOSIS — Z9049 Acquired absence of other specified parts of digestive tract: Secondary | ICD-10-CM

## 2021-11-20 DIAGNOSIS — E890 Postprocedural hypothyroidism: Secondary | ICD-10-CM

## 2021-11-20 DIAGNOSIS — M7061 Trochanteric bursitis, right hip: Secondary | ICD-10-CM | POA: Diagnosis not present

## 2021-11-20 DIAGNOSIS — E559 Vitamin D deficiency, unspecified: Secondary | ICD-10-CM

## 2021-11-20 DIAGNOSIS — M5136 Other intervertebral disc degeneration, lumbar region: Secondary | ICD-10-CM | POA: Diagnosis not present

## 2021-11-20 DIAGNOSIS — M19041 Primary osteoarthritis, right hand: Secondary | ICD-10-CM | POA: Diagnosis not present

## 2021-11-20 DIAGNOSIS — M19072 Primary osteoarthritis, left ankle and foot: Secondary | ICD-10-CM

## 2021-11-20 DIAGNOSIS — Z8639 Personal history of other endocrine, nutritional and metabolic disease: Secondary | ICD-10-CM

## 2021-11-20 DIAGNOSIS — Z8719 Personal history of other diseases of the digestive system: Secondary | ICD-10-CM

## 2021-11-20 DIAGNOSIS — Z9884 Bariatric surgery status: Secondary | ICD-10-CM

## 2021-11-20 DIAGNOSIS — Z8542 Personal history of malignant neoplasm of other parts of uterus: Secondary | ICD-10-CM

## 2021-11-20 MED ORDER — COLCHICINE 0.6 MG PO CAPS: 1.0000 | ORAL_CAPSULE | Freq: Every day | ORAL | 0 refills | Status: DC

## 2021-11-20 NOTE — Patient Instructions (Signed)
Hand Exercises Hand exercises can be helpful for almost anyone. These exercises can strengthen the hands, improve flexibility and movement, and increase blood flow to the hands. These results can make work and daily tasks easier. Hand exercises can be especially helpful for people who have joint pain from arthritis or have nerve damage from overuse (carpal tunnel syndrome). These exercises can also help people who have injured a hand. Exercises Most of these hand exercises are gentle stretching and motion exercises. It is usually safe to do them often throughout the day. Warming up your hands before exercise may help to reduce stiffness. You can do this with gentle massage or by placing your hands in warm water for 10-15 minutes. It is normal to feel some stretching, pulling, tightness, or mild discomfort as you begin new exercises. This will gradually improve. Stop an exercise right away if you feel sudden, severe pain or your pain gets worse. Ask your health care provider which exercises are best for you. Knuckle bend or "claw" fist  Stand or sit with your arm, hand, and all five fingers pointed straight up. Make sure to keep your wrist straight during the exercise. Gently bend your fingers down toward your palm until the tips of your fingers are touching the top of your palm. Keep your big knuckle straight and just bend the small knuckles in your fingers. Hold this position for __________ seconds. Straighten (extend) your fingers back to the starting position. Repeat this exercise 5-10 times with each hand. Full finger fist  Stand or sit with your arm, hand, and all five fingers pointed straight up. Make sure to keep your wrist straight during the exercise. Gently bend your fingers into your palm until the tips of your fingers are touching the middle of your palm. Hold this position for __________ seconds. Extend your fingers back to the starting position, stretching every joint fully. Repeat  this exercise 5-10 times with each hand. Straight fist Stand or sit with your arm, hand, and all five fingers pointed straight up. Make sure to keep your wrist straight during the exercise. Gently bend your fingers at the big knuckle, where your fingers meet your hand, and the middle knuckle. Keep the knuckle at the tips of your fingers straight and try to touch the bottom of your palm. Hold this position for __________ seconds. Extend your fingers back to the starting position, stretching every joint fully. Repeat this exercise 5-10 times with each hand. Tabletop  Stand or sit with your arm, hand, and all five fingers pointed straight up. Make sure to keep your wrist straight during the exercise. Gently bend your fingers at the big knuckle, where your fingers meet your hand, as far down as you can while keeping the small knuckles in your fingers straight. Think of forming a tabletop with your fingers. Hold this position for __________ seconds. Extend your fingers back to the starting position, stretching every joint fully. Repeat this exercise 5-10 times with each hand. Finger spread  Place your hand flat on a table with your palm facing down. Make sure your wrist stays straight as you do this exercise. Spread your fingers and thumb apart from each other as far as you can until you feel a gentle stretch. Hold this position for __________ seconds. Bring your fingers and thumb tight together again. Hold this position for __________ seconds. Repeat this exercise 5-10 times with each hand. Making circles  Stand or sit with your arm, hand, and all five fingers pointed  straight up. Make sure to keep your wrist straight during the exercise. Make a circle by touching the tip of your thumb to the tip of your index finger. Hold for __________ seconds. Then open your hand wide. Repeat this motion with your thumb and each finger on your hand. Repeat this exercise 5-10 times with each hand. Thumb  motion  Sit with your forearm resting on a table and your wrist straight. Your thumb should be facing up toward the ceiling. Keep your fingers relaxed as you move your thumb. Lift your thumb up as high as you can toward the ceiling. Hold for __________ seconds. Bend your thumb across your palm as far as you can, reaching the tip of your thumb for the small finger (pinkie) side of your palm. Hold for __________ seconds. Repeat this exercise 5-10 times with each hand. Grip strengthening  Hold a stress ball or other soft ball in the middle of your hand. Slowly increase the pressure, squeezing the ball as much as you can without causing pain. Think of bringing the tips of your fingers into the middle of your palm. All of your finger joints should bend when doing this exercise. Hold your squeeze for __________ seconds, then relax. Repeat this exercise 5-10 times with each hand. Contact a health care provider if: Your hand pain or discomfort gets much worse when you do an exercise. Your hand pain or discomfort does not improve within 2 hours after you exercise. If you have any of these problems, stop doing these exercises right away. Do not do them again unless your health care provider says that you can. Get help right away if: You develop sudden, severe hand pain or swelling. If this happens, stop doing these exercises right away. Do not do them again unless your health care provider says that you can. This information is not intended to replace advice given to you by your health care provider. Make sure you discuss any questions you have with your health care provider. Knee Exercises Ask your health care provider which exercises are safe for you. Do exercises exactly as told by your health care provider and adjust them as directed. It is normal to feel mild stretching, pulling, tightness, or discomfort as you do these exercises. Stop right away if you feel sudden pain or your pain gets worse. Do not  begin these exercises until told by your health care provider. Stretching and range-of-motion exercises These exercises warm up your muscles and joints and improve the movement and flexibility of your knee. These exercises also help to relieve pain and swelling. Knee extension, prone  Lie on your abdomen (prone position) on a bed. Place your left / right knee just beyond the edge of the surface so your knee is not on the bed. You can put a towel under your left / right thigh just above your kneecap for comfort. Relax your leg muscles and allow gravity to straighten your knee (extension). You should feel a stretch behind your left / right knee. Hold this position for __________ seconds. Scoot up so your knee is supported between repetitions. Repeat __________ times. Complete this exercise __________ times a day. Knee flexion, active  Lie on your back with both legs straight. If this causes back discomfort, bend your left / right knee so your foot is flat on the floor. Slowly slide your left / right heel back toward your buttocks. Stop when you feel a gentle stretch in the front of your knee or thigh (  flexion). Hold this position for __________ seconds. Slowly slide your left / right heel back to the starting position. Repeat __________ times. Complete this exercise __________ times a day. Quadriceps stretch, prone  Lie on your abdomen on a firm surface, such as a bed or padded floor. Bend your left / right knee and hold your ankle. If you cannot reach your ankle or pant leg, loop a belt around your foot and grab the belt instead. Gently pull your heel toward your buttocks. Your knee should not slide out to the side. You should feel a stretch in the front of your thigh and knee (quadriceps). Hold this position for __________ seconds. Repeat __________ times. Complete this exercise __________ times a day. Hamstring, supine  Lie on your back (supine position). Loop a belt or towel over the  ball of your left / right foot. The ball of your foot is on the walking surface, right under your toes. Straighten your left / right knee and slowly pull on the belt to raise your leg until you feel a gentle stretch behind your knee (hamstring). Do not let your knee bend while you do this. Keep your other leg flat on the floor. Hold this position for __________ seconds. Repeat __________ times. Complete this exercise __________ times a day. Strengthening exercises These exercises build strength and endurance in your knee. Endurance is the ability to use your muscles for a long time, even after they get tired. Quadriceps, isometric This exercise strengthens the muscles in front of your thigh (quadriceps) without moving your knee joint (isometric). Lie on your back with your left / right leg extended and your other knee bent. Put a rolled towel or small pillow under your knee if told by your health care provider. Slowly tense the muscles in the front of your left / right thigh. You should see your kneecap slide up toward your hip or see increased dimpling just above the knee. This motion will push the back of the knee toward the floor. For __________ seconds, hold the muscle as tight as you can without increasing your pain. Relax the muscles slowly and completely. Repeat __________ times. Complete this exercise __________ times a day. Straight leg raises This exercise strengthens the muscles in front of your thigh (quadriceps) and the muscles that move your hips (hip flexors). Lie on your back with your left / right leg extended and your other knee bent. Tense the muscles in the front of your left / right thigh. You should see your kneecap slide up or see increased dimpling just above the knee. Your thigh may even shake a bit. Keep these muscles tight as you raise your leg 4-6 inches (10-15 cm) off the floor. Do not let your knee bend. Hold this position for __________ seconds. Keep these muscles  tense as you lower your leg. Relax your muscles slowly and completely after each repetition. Repeat __________ times. Complete this exercise __________ times a day. Hamstring, isometric  Lie on your back on a firm surface. Bend your left / right knee about __________ degrees. Dig your left / right heel into the surface as if you are trying to pull it toward your buttocks. Tighten the muscles in the back of your thighs (hamstring) to "dig" as hard as you can without increasing any pain. Hold this position for __________ seconds. Release the tension gradually and allow your muscles to relax completely for __________ seconds after each repetition. Repeat __________ times. Complete this exercise __________ times a day.  Hamstring curls If told by your health care provider, do this exercise while wearing ankle weights. Begin with __________lb / kg weights. Then increase the weight by 1 lb (0.5 kg) increments. Do not wear ankle weights that are more than __________lb / kg. Lie on your abdomen with your legs straight. Bend your left / right knee as far as you can without feeling pain. Keep your hips flat against the floor. Hold this position for __________ seconds. Slowly lower your leg to the starting position. Repeat __________ times. Complete this exercise __________ times a day. Squats This exercise strengthens the muscles in front of your thigh and knee (quadriceps). Stand in front of a table, with your feet and knees pointing straight ahead. You may rest your hands on the table for balance but not for support. Slowly bend your knees and lower your hips like you are going to sit in a chair. Keep your weight over your heels, not over your toes. Keep your lower legs upright so they are parallel with the table legs. Do not let your hips go lower than your knees. Do not bend lower than told by your health care provider. If your knee pain increases, do not bend as low. Hold the squat position for  __________ seconds. Slowly push with your legs to return to standing. Do not use your hands to pull yourself to standing. Repeat __________ times. Complete this exercise __________ times a day. Wall slides This exercise strengthens the muscles in front of your thigh and knee (quadriceps). Lean your back against a smooth wall or door, and walk your feet out 18-24 inches (46-61 cm) from it. Place your feet hip-width apart. Slowly slide down the wall or door until your knees bend __________ degrees. Keep your knees over your heels, not over your toes. Keep your knees in line with your hips. Hold this position for __________ seconds. Repeat __________ times. Complete this exercise __________ times a day. Straight leg raises, side-lying This exercise strengthens the muscles that rotate the leg at the hip and move it away from your body (hip abductors). Lie on your side with your left / right leg in the top position. Lie so your head, shoulder, knee, and hip line up. You may bend your bottom knee to help you keep your balance. Roll your hips slightly forward so your hips are stacked directly over each other and your left / right knee is facing forward. Leading with your heel, lift your top leg 4-6 inches (10-15 cm). You should feel the muscles in your outer hip lifting. Do not let your foot drift forward. Do not let your knee roll toward the ceiling. Hold this position for __________ seconds. Slowly return your leg to the starting position. Let your muscles relax completely after each repetition. Repeat __________ times. Complete this exercise __________ times a day. Straight leg raises, prone This exercise stretches the muscles that move your hips away from the front of the pelvis (hip extensors). Lie on your abdomen on a firm surface. You can put a pillow under your hips if that is more comfortable. Tense the muscles in your buttocks and lift your left / right leg about 4-6 inches (10-15 cm).  Keep your knee straight as you lift your leg. Hold this position for __________ seconds. Slowly lower your leg to the starting position. Let your leg relax completely after each repetition. Repeat __________ times. Complete this exercise __________ times a day. This information is not intended to replace advice given  to you by your health care provider. Make sure you discuss any questions you have with your health care provider. Document Revised: 07/11/2021 Document Reviewed: 07/11/2021 Elsevier Patient Education  [place].

## 2021-11-21 LAB — COMPLETE METABOLIC PANEL WITH GFR
AG Ratio: 2.1 (calc) (ref 1.0–2.5)
ALT: 32 U/L — ABNORMAL HIGH (ref 6–29)
AST: 27 U/L (ref 10–35)
Albumin: 3.7 g/dL (ref 3.6–5.1)
Alkaline phosphatase (APISO): 102 U/L (ref 37–153)
BUN: 16 mg/dL (ref 7–25)
CO2: 28 mmol/L (ref 20–32)
Calcium: 8.9 mg/dL (ref 8.6–10.4)
Chloride: 108 mmol/L (ref 98–110)
Creat: 0.85 mg/dL (ref 0.50–1.05)
Globulin: 1.8 g/dL (calc) — ABNORMAL LOW (ref 1.9–3.7)
Glucose, Bld: 93 mg/dL (ref 65–99)
Potassium: 4.3 mmol/L (ref 3.5–5.3)
Sodium: 144 mmol/L (ref 135–146)
Total Bilirubin: 0.5 mg/dL (ref 0.2–1.2)
Total Protein: 5.5 g/dL — ABNORMAL LOW (ref 6.1–8.1)
eGFR: 75 mL/min/{1.73_m2} (ref >=60)

## 2021-11-21 LAB — CBC WITH DIFFERENTIAL/PLATELET
Absolute Monocytes: 298 cells/uL (ref 200–950)
Basophils Absolute: 31 cells/uL (ref 0–200)
Basophils Relative: 1 %
Eosinophils Absolute: 310 cells/uL (ref 15–500)
Eosinophils Relative: 10 %
HCT: 37.5 % (ref 35.0–45.0)
Hemoglobin: 12.5 g/dL (ref 11.7–15.5)
Lymphs Abs: 1097 cells/uL (ref 850–3900)
MCH: 32.6 pg (ref 27.0–33.0)
MCHC: 33.3 g/dL (ref 32.0–36.0)
MCV: 97.7 fL (ref 80.0–100.0)
MPV: 12.1 fL (ref 7.5–12.5)
Monocytes Relative: 9.6 %
Neutro Abs: 1364 cells/uL — ABNORMAL LOW (ref 1500–7800)
Neutrophils Relative %: 44 %
Platelets: 146 10*3/uL (ref 140–400)
RBC: 3.84 10*6/uL (ref 3.80–5.10)
RDW: 13.3 % (ref 11.0–15.0)
Total Lymphocyte: 35.4 %
WBC: 3.1 10*3/uL — ABNORMAL LOW (ref 3.8–10.8)

## 2021-11-21 NOTE — Progress Notes (Signed)
White cell count is low, total protein is low, liver function is mildly elevated and stable.

## 2021-11-23 DIAGNOSIS — B359 Dermatophytosis, unspecified: Secondary | ICD-10-CM | POA: Diagnosis not present

## 2021-11-23 DIAGNOSIS — L900 Lichen sclerosus et atrophicus: Secondary | ICD-10-CM | POA: Diagnosis not present

## 2021-12-24 ENCOUNTER — Other Ambulatory Visit: Payer: Self-pay | Admitting: Rheumatology

## 2022-01-09 ENCOUNTER — Other Ambulatory Visit: Payer: Self-pay | Admitting: Rheumatology

## 2022-01-10 ENCOUNTER — Other Ambulatory Visit: Payer: Self-pay | Admitting: *Deleted

## 2022-01-10 MED ORDER — COLCHICINE 0.6 MG PO TABS: 0.6000 mg | ORAL_TABLET | Freq: Every day | ORAL | 0 refills | Status: DC

## 2022-01-10 NOTE — Telephone Encounter (Signed)
Received a fax from [facility] stating Mitigare is no longer covered by patient's insurance. Asking for a prescription of Colchicine tablets which are covered by the insurance. ? ?Okay to change prescription to Colchicine tablets?  ?

## 2022-01-16 ENCOUNTER — Ambulatory Visit (INDEPENDENT_AMBULATORY_CARE_PROVIDER_SITE_OTHER): Payer: Medicare Other | Admitting: Endocrinology

## 2022-01-16 ENCOUNTER — Encounter: Payer: Self-pay | Admitting: Internal Medicine

## 2022-01-16 ENCOUNTER — Ambulatory Visit: Payer: Medicare Other | Admitting: Endocrinology

## 2022-01-16 ENCOUNTER — Encounter: Payer: Self-pay | Admitting: Endocrinology

## 2022-01-16 ENCOUNTER — Other Ambulatory Visit: Payer: Self-pay

## 2022-01-16 ENCOUNTER — Other Ambulatory Visit (HOSPITAL_COMMUNITY): Payer: Self-pay

## 2022-01-16 VITALS — BP 118/74 | HR 75 | Ht 71.0 in | Wt 174.0 lb

## 2022-01-16 DIAGNOSIS — E1140 Type 2 diabetes mellitus with diabetic neuropathy, unspecified: Secondary | ICD-10-CM | POA: Diagnosis not present

## 2022-01-16 LAB — POCT GLYCOSYLATED HEMOGLOBIN (HGB A1C): Hemoglobin A1C: 7 % — AB (ref 4.0–5.6)

## 2022-01-16 NOTE — Progress Notes (Signed)
Subjective:    Patient ID: Savannah [name], female    DOB: [birth year], 66 y.o.   MRN: [id number]  HPI Pt returns for f/u of diabetes mellitus:  DM type: 2 Dx'ed: 4492 Complications: PPN, and CAD (seen on CT).   Therapy: Trulicity and 3 oral meds.   GDM: never DKA: never Severe hypoglycemia: never.   Pancreatitis: never.  Other: she took insulin from 2004-2011, when she had gastric bypass surgery.  She is retired; edema limits rx options.  She seldom checks cbg.   Interval history:  No recent steroids.  She takes meds as rx'ed, except she takes repaglinide just BID.    Past Medical History:  Diagnosis Date   ABDOMINAL PAIN, CHRONIC 10/20/2009   Acute cystitis 08/17/2009   ALLERGIC RHINITIS 11/21/2007   Allergy    ASYMPTOMATIC POSTMENOPAUSAL STATUS 09/29/2008   Bariatric surgery status 07/07/2010   Bariatric surgery status 07/07/2010   Qualifier: Diagnosis of  By: Loanne Drilling MD, Symia Herdt A    Cancer [facility])    uterine   Cataract    Coronary artery disease    DEPRESSION 11/21/2007   DIABETES MELLITUS, TYPE II 07/08/2007   DYSPNEA 05/20/2009   Edema 01/00/7121   Eosinophilic esophagitis 97/58/8325   FEVER UNSPECIFIED 10/20/2009   FEVER, HX OF 03/09/2010   GERD (gastroesophageal reflux disease)    Headache(784.0) 02/06/2010   Hepatomegaly 01/06/2008   HYPERLIPIDEMIA 07/08/2007   HYPERTENSION 07/08/2007   LEUKOPENIA, MILD 09/29/2008   OBESITY 01/06/2008   OSTEOARTHRITIS 01/06/2008   Other chronic nonalcoholic liver disease 49/82/6415   PERIPHERAL NEUROPATHY 11/21/2007   Peripheral neuropathy    Postsurgical hypothyroidism 09/19/2010   SINUSITIS- ACUTE-NOS 11/21/2007   TB SKIN TEST, POSITIVE 02/06/2010   THYROID NODULE 03/09/2010    Past Surgical History:  Procedure Laterality Date   ABDOMINAL HYSTERECTOMY     BASAL CELL CARCINOMA EXCISION     CARDIAC CATHETERIZATION     CHOLECYSTECTOMY N/A 05/11/2021   Procedure: LAPAROSCOPIC CHOLECYSTECTOMY WITH POSSIBLE  INTRAOPERATIVE  CHOLANGIOGRAM;  Surgeon: Johnathan Hausen, MD;  Location: [facility];  Service: General;  Laterality: N/A;   COLONOSCOPY     EYE SURGERY Bilateral 08/2018, 09/2018   FOOT SURGERY Left    10/2019   KNEE ARTHROSCOPY Left    LEFT HEART CATH AND CORONARY ANGIOGRAPHY N/A 01/20/2019   Procedure: LEFT HEART CATH AND CORONARY ANGIOGRAPHY;  Surgeon: Belva Crome, MD;  Location: [facility];  Service: Cardiovascular;  Laterality: N/A;   LYMPH NODE DISSECTION     OOPHORECTOMY     Right total knee replacement     ROUX-EN-Y GASTRIC BYPASS  2011   SKIN TAG REMOVAL     THYROIDECTOMY     TONSILLECTOMY AND ADENOIDECTOMY     UPPER GASTROINTESTINAL ENDOSCOPY      Social History   Socioeconomic History   Marital status: Single    Spouse name: Not on file   Number of children: 0   Years of education: 16   Highest education level: Bachelor's degree (e.g., BA, AB, BS)  Occupational History   Occupation: Optometrist   Occupation: Retired  Tobacco Use   Smoking status: Former    Packs/day: 1.50    Years: 15.00    Pack years: 22.50    Types: Cigarettes    Quit date: 1992    Years since quitting: 31.2   Smokeless tobacco: Never  Vaping Use   Vaping Use: Never used  Substance and Sexual Activity   Alcohol use: Yes  Comment: rarely   Drug use: Never   Sexual activity: Not Currently  Other Topics Concern   Not on file  Social History Narrative   Not on file   Social Determinants of Health   Financial Resource Strain: Not on file  Food Insecurity: Not on file  Transportation Needs: Not on file  Physical Activity: Not on file  Stress: Not on file  Social Connections: Not on file  Intimate Partner Violence: Not on file    Current Outpatient Medications on File Prior to Visit  Medication Sig Dispense Refill   acetaminophen (TYLENOL) 500 MG tablet Take 2 tablets (1,000 mg total) by mouth every 6 (six) hours as needed for mild pain or fever. 30 tablet 0   AMBULATORY NON FORMULARY  MEDICATION Administrator at [employer]  Correct leg length and improve pronation BL 1 each 0   aspirin EC 81 MG tablet Take 1 tablet (81 mg total) by mouth daily. Starting 01/18/19 prior to cardiac cath     Cholecalciferol (VITAMIN D) 50 MCG (2000 UT) tablet Take 4,000 Units by mouth daily.     CLINPRO 5000 1.1 % PSTE See admin instructions.     Colchicine (MITIGARE) 0.6 MG CAPS Take 1 tablet by mouth daily. 90 capsule 0   colchicine 0.6 MG tablet Take 1 tablet (0.6 mg total) by mouth daily. 90 tablet 0   cyclobenzaprine (FLEXERIL) 10 MG tablet Take 10 mg by mouth at bedtime as needed for muscle spasms.     desloratadine (CLARINEX) 5 MG tablet Take 5 mg by mouth daily.     diclofenac sodium (VOLTAREN) 1 % GEL Apply 4 g topically 4 (four) times daily. 100 g 11   docusate sodium (COLACE) 100 MG capsule Take 200 mg by mouth 2 (two) times daily.     Dulaglutide (TRULICITY) 4.5 BE/0.1EO SOPN Inject 4.5 mg as directed once a week. 6 mL 3   Furosemide (LASIX PO) Take by mouth. Takes PRN     gabapentin (NEURONTIN) 300 MG capsule TAKE 3 CAPSULES BY MOUTH 3 TIMES DAILY. 360 capsule 2   gabapentin (NEURONTIN) 600 MG tablet TAKE 2 TABLETS (1,200 MG TOTAL) BY MOUTH 3 (THREE) TIMES DAILY. 180 tablet 1   halobetasol (ULTRAVATE) 0.05 % cream Apply 1 application topically 2 (two) times daily as needed (lichen sclerosus).   2   INVOKANA 300 MG TABS tablet TAKE 1 TABLET (300 MG TOTAL) BY MOUTH DAILY BEFORE BREAKFAST. 90 tablet 3   levothyroxine (SYNTHROID) 112 MCG tablet TAKE 1 TABLET BY MOUTH EVERY DAY 90 tablet 2   lidocaine (LIDODERM) 5 % PLACE 1 PATCH ONTO THE SKIN DAILY. REMOVE & DISCARD PATCH WITHIN 12 HOURS OR AS DIRECTED BY MD 30 patch 0   MAGNESIUM PO Take 4 tablets by mouth daily as needed (cramps).     metFORMIN (GLUCOPHAGE-XR) 500 MG 24 hr tablet Take 4 tablets (2,000 mg total) by mouth daily. 360 tablet 3   Multiple Vitamin (MULTIVITAMIN) tablet Take 1 tablet by mouth in the morning and at  bedtime.     nitroGLYCERIN (NITROSTAT) 0.4 MG SL tablet Place 1 tablet (0.4 mg total) under the tongue every 5 (five) minutes as needed. 25 tablet 3   ONETOUCH ULTRA test strip USE TO MONITOR GLUCOSE LEVELS ONCE PER DAY E11.40 100 strip 2   OXYCODONE HCL PO Take by mouth as needed.     pantoprazole (PROTONIX) 40 MG tablet TAKE 1 TABLET BY MOUTH TWICE A DAY 180 tablet 1  Probiotic CAPS Take 1 capsule by mouth daily.     repaglinide (PRANDIN) 2 MG tablet Take 1 tablet (2 mg total) by mouth 3 (three) times daily before meals. 270 tablet 3   TRAMADOL HCL PO Take by mouth as needed.     atorvastatin (LIPITOR) 40 MG tablet Take 1 tablet (40 mg total) by mouth daily. 90 tablet 3   No current facility-administered medications on file prior to visit.    Allergies  Allergen Reactions   Ace Inhibitors Cough   Caffeine     PVCs   Ciprofloxacin Itching   Morphine And Related Itching    Pt tolerates medication if given with diphenhydramine   Mucinex [Guaifenesin Er]     PVCs   Nsaids     Gastric Bypass Surgery - unable to take, tolerates ec 81 aspirin    Other     Antihistamine-alkylamine - PVCs tolerates benadryl    Silicone Hives   Tape Hives    Family History  Problem Relation Age of Onset   Diabetes Mother    Neuropathy Mother    Diabetes Father    Congestive Heart Failure Father    Hypertension Father    Myelodysplastic syndrome Father    Bladder Cancer Sister    Multiple sclerosis Sister    Breast cancer Sister        breast onset age 73   Osteoporosis Sister    Liver cancer Maternal Aunt    Breast cancer Maternal Aunt    Diabetes Maternal Aunt    Colon cancer Maternal Uncle    Diabetes Maternal Uncle    Neuropathy Paternal Aunt    Diabetes Maternal Grandmother    Diabetes Paternal Grandmother     BP 118/74 (BP Location: Left Arm, Patient Position: Sitting, Cuff Size: Normal)    Pulse 75    Ht 5' 11"  (1.803 m)    Wt 174 lb (78.9 kg)    SpO2 98%    BMI 24.27 kg/m     Review of Systems     Objective:   Physical Exam VITAL SIGNS:  See vs page.   GENERAL: no distress.   Ext: 2+ left and 1+ right leg edema.     Lab Results  Component Value Date   HGBA1C 7.0 (A) 01/16/2022      Assessment & Plan:  Type 2 DM: uncontrolled  Patient Instructions  Please increase the repaglinide to 3 times a day (just before each meal), and continue the same other medications.   check your blood sugar once a day.  vary the time of day when you check, between before the 3 meals, and at bedtime.  also check if you have symptoms of your blood sugar being too high or too low.  please keep a record of the readings and bring it to your next appointment here (or you can bring the meter itself).  You can write it on any piece of paper.  please call us sooner if your blood sugar goes below 70, or if you have a lot of readings over 200.   Please come back for a follow-up appointment in 6 months.

## 2022-01-16 NOTE — Patient Instructions (Addendum)
Please increase the repaglinide to 3 times a day (just before each meal), and continue the same other medications.   ?check your blood sugar once a day.  vary the time of day when you check, between before the 3 meals, and at bedtime.  also check if you have symptoms of your blood sugar being too high or too low.  please keep a record of the readings and bring it to your next appointment here (or you can bring the meter itself).  You can write it on any piece of paper.  please call us sooner if your blood sugar goes below 70, or if you have a lot of readings over 200.   ?Please come back for a follow-up appointment in 6 months.   ?

## 2022-01-30 DIAGNOSIS — N952 Postmenopausal atrophic vaginitis: Secondary | ICD-10-CM | POA: Diagnosis not present

## 2022-01-30 DIAGNOSIS — B3732 Chronic candidiasis of vulva and vagina: Secondary | ICD-10-CM | POA: Diagnosis not present

## 2022-01-30 DIAGNOSIS — L900 Lichen sclerosus et atrophicus: Secondary | ICD-10-CM | POA: Diagnosis not present

## 2022-02-01 ENCOUNTER — Telehealth: Payer: Self-pay | Admitting: *Deleted

## 2022-02-01 NOTE — Telephone Encounter (Signed)
Submitted a Prior Authorization request to [employer] for  Lidocaine Patches  via CoverMyMeds. Will update once we receive a response. ? ? ? ?

## 2022-02-02 NOTE — Telephone Encounter (Signed)
Received notification from [facility] regarding a prior authorization for  Lidocaine patches . Authorization has been APPROVED from 01/02/2022 to 02/03/2023.  ? ?

## 2022-02-05 ENCOUNTER — Other Ambulatory Visit: Payer: Self-pay | Admitting: Family Medicine

## 2022-02-05 ENCOUNTER — Encounter: Payer: Self-pay | Admitting: Endocrinology

## 2022-02-05 ENCOUNTER — Encounter: Payer: Self-pay | Admitting: Family Medicine

## 2022-02-05 MED ORDER — GABAPENTIN 600 MG PO TABS: ORAL_TABLET | ORAL | 1 refills | Status: DC

## 2022-02-27 DIAGNOSIS — L900 Lichen sclerosus et atrophicus: Secondary | ICD-10-CM | POA: Diagnosis not present

## 2022-02-27 DIAGNOSIS — N952 Postmenopausal atrophic vaginitis: Secondary | ICD-10-CM | POA: Diagnosis not present

## 2022-03-01 ENCOUNTER — Ambulatory Visit (INDEPENDENT_AMBULATORY_CARE_PROVIDER_SITE_OTHER): Payer: Medicare Other | Admitting: Family Medicine

## 2022-03-01 ENCOUNTER — Encounter: Payer: Self-pay | Admitting: Family Medicine

## 2022-03-01 VITALS — BP 114/74 | HR 82 | Temp 98.5°F | Ht 71.0 in | Wt 176.8 lb

## 2022-03-01 DIAGNOSIS — D500 Iron deficiency anemia secondary to blood loss (chronic): Secondary | ICD-10-CM

## 2022-03-01 DIAGNOSIS — E1169 Type 2 diabetes mellitus with other specified complication: Secondary | ICD-10-CM

## 2022-03-01 DIAGNOSIS — R600 Localized edema: Secondary | ICD-10-CM

## 2022-03-01 DIAGNOSIS — L0390 Cellulitis, unspecified: Secondary | ICD-10-CM

## 2022-03-01 DIAGNOSIS — Z1211 Encounter for screening for malignant neoplasm of colon: Secondary | ICD-10-CM | POA: Diagnosis not present

## 2022-03-01 DIAGNOSIS — E890 Postprocedural hypothyroidism: Secondary | ICD-10-CM | POA: Diagnosis not present

## 2022-03-01 DIAGNOSIS — E785 Hyperlipidemia, unspecified: Secondary | ICD-10-CM | POA: Diagnosis not present

## 2022-03-01 LAB — IBC + FERRITIN
Ferritin: 80.6 ng/mL (ref 10.0–291.0)
Iron: 80 ug/dL (ref 42–145)
Saturation Ratios: 27.3 % (ref 20.0–50.0)
TIBC: 292.6 ug/dL (ref 250.0–450.0)
Transferrin: 209 mg/dL — ABNORMAL LOW (ref 212.0–360.0)

## 2022-03-01 LAB — LIPID PANEL
Cholesterol: 127 mg/dL (ref 0–200)
HDL: 50.2 mg/dL (ref >39.00)
LDL Cholesterol: 58 mg/dL (ref 0–99)
NonHDL: 76.96
Total CHOL/HDL Ratio: 3
Triglycerides: 94 mg/dL (ref 0.0–149.0)
VLDL: 18.8 mg/dL (ref 0.0–40.0)

## 2022-03-01 LAB — CBC
HCT: 35.2 % — ABNORMAL LOW (ref 36.0–46.0)
Hemoglobin: 11.7 g/dL — ABNORMAL LOW (ref 12.0–15.0)
MCHC: 33.1 g/dL (ref 30.0–36.0)
MCV: 97.6 fl (ref 78.0–100.0)
Platelets: 125 10*3/uL — ABNORMAL LOW (ref 150.0–400.0)
RBC: 3.61 Mil/uL — ABNORMAL LOW (ref 3.87–5.11)
RDW: 14.2 % (ref 11.5–15.5)
WBC: 3.1 10*3/uL — ABNORMAL LOW (ref 4.0–10.5)

## 2022-03-01 LAB — COMPREHENSIVE METABOLIC PANEL
ALT: 47 U/L — ABNORMAL HIGH (ref 0–35)
AST: 31 U/L (ref 0–37)
Albumin: 3.7 g/dL (ref 3.5–5.2)
Alkaline Phosphatase: 111 U/L (ref 39–117)
BUN: 17 mg/dL (ref 6–23)
CO2: 26 mEq/L (ref 19–32)
Calcium: 8.2 mg/dL — ABNORMAL LOW (ref 8.4–10.5)
Chloride: 105 mEq/L (ref 96–112)
Creatinine, Ser: 1.02 mg/dL (ref 0.40–1.20)
GFR: 56.8 mL/min — ABNORMAL LOW (ref >60.00)
Glucose, Bld: 275 mg/dL — ABNORMAL HIGH (ref 70–99)
Potassium: 4.3 mEq/L (ref 3.5–5.1)
Sodium: 140 mEq/L (ref 135–145)
Total Bilirubin: 0.4 mg/dL (ref 0.2–1.2)
Total Protein: 5.4 g/dL — ABNORMAL LOW (ref 6.0–8.3)

## 2022-03-01 LAB — TSH: TSH: 2.81 u[IU]/mL (ref 0.35–5.50)

## 2022-03-01 MED ORDER — DOXYCYCLINE HYCLATE 100 MG PO TABS: 100.0000 mg | ORAL_TABLET | Freq: Two times a day (BID) | ORAL | 0 refills | Status: DC

## 2022-03-01 NOTE — Assessment & Plan Note (Signed)
Check lipid panel per patient request. ?

## 2022-03-01 NOTE — Patient Instructions (Signed)
It was very nice to see you today! ? ?Please start the doxycycline. ? ?We will check blood work today. ? ?Let me know if not improving in the next several days. ? ?Take care, ?Dr Jerline Pain ? ?PLEASE NOTE: ? ?If you had any lab tests please let us know if you have not heard back within a few days. You may see your results on mychart before we have a chance to review them but we will give you a call once they are reviewed by [place]. If we ordered any referrals today, please let us know if you have not heard from their office within the next week.  ? ?Please try these tips to maintain a healthy lifestyle: ? ?Eat at least 3 REAL meals and 1-2 snacks per day.  Aim for no more than 5 hours between eating.  If you eat breakfast, please do so within one hour of getting up.  ? ?Each meal should contain half fruits/vegetables, one quarter protein, and one quarter carbs (no bigger than a computer mouse) ? ?Cut down on sweet beverages. This includes juice, soda, and sweet tea.  ? ?Drink at least 1 glass of water with each meal and aim for at least 8 glasses per day ? ?Exercise at least 150 minutes every week.   ?

## 2022-03-01 NOTE — Assessment & Plan Note (Signed)
She is on Synthroid 125 mcg daily.  Check TSH. ?

## 2022-03-01 NOTE — Progress Notes (Signed)
? ?  Savannah [name] is a 66 y.o. female who presents today for an office visit. ? ?Assessment/Plan:  ?New/Acute Problems: ?Rash ?Concern for possible cellulitis given her degree of pain.  She had some chills but no fevers or other signs of systemic illness. She was initially concerned about sunburn however this is not a very atypical location and she does not have any obvious areas of sunburn elsewhere on her body - doubt this is the case.  We will empirically treat for cellulitis with course of doxycycline.  She will let me know if not improving. ? ?Chronic Problems Addressed Today: ?Leg edema ?She is on Lasix 20 mg daily as needed per cardiology.  We discussed increasing dose however she deferred for today until she follows up with cardiology.  We will check labs to make sure her anemia and thyroid are at goal as these could be contributing as well. ? ?Iron deficiency anemia ?Check CBC and iron panel today. ? ?Dyslipidemia associated with type 2 diabetes mellitus ([place]) ?Check lipid panel per patient request. ? ?Postsurgical hypothyroidism ?She is on Synthroid 125 mcg daily.  Check TSH. ? ? ?  ?Subjective:  ?HPI: ? ?Patient here with pain and swelling to left lower extremity.  She has a chronic history of lower extremity edema and has been prescribed Lasix by her cardiologist in the past for this.  Seems to have worsened recently.  She was on vacation at the beach about 5 days ago and thinks that she may have gotten sunburn on her left leg.  Since then she has noticed increasing swelling.  A lot of redness.  She has developed blisters on the dorsal aspect of her left foot.  She did have a little bit of sunburn on her right leg which is since resolved.  No swelling in this leg.  Area is painful to touch.  She has had occasional chills.  No nausea or vomiting.  She is hesitant about taking too much Lasix due to concern for electrolyte derangement. ? ?   ?  ?Objective:  ?Physical Exam: ?BP 114/74   Pulse 82   Temp 98.5  ?F (36.9 ?C) (Temporal)   Ht 5' 11"  (1.803 m)   Wt 176 lb 12.8 oz (80.2 kg)   SpO2 97%   BMI 24.66 kg/m?   ?Gen: No acute distress, resting comfortably ?CV: Regular rate and rhythm with no murmurs appreciated ?Pulm: Normal work of breathing, clear to auscultation bilaterally with no crackles, wheezes, or rhonchi ?MSK: ?- Left Leg: 2+ pitting edema to knee.  Dorsal aspect of left foot with erythema and vesicles present.  Anterior left lower leg with erythema.  Tenderness to palpation.  Warm to touch.  Neurovascular intact distally. ?Neuro: Grossly normal, moves all extremities ?Psych: Normal affect and thought content ? ?   ? ?Algis Greenhouse. Jerline Pain, MD ?03/01/2022 11:16 AM  ?

## 2022-03-01 NOTE — Assessment & Plan Note (Signed)
Check CBC and iron panel today.  

## 2022-03-01 NOTE — Assessment & Plan Note (Signed)
She is on Lasix 20 mg daily as needed per cardiology.  We discussed increasing dose however she deferred for today until she follows up with cardiology.  We will check labs to make sure her anemia and thyroid are at goal as these could be contributing as well. ?

## 2022-03-02 NOTE — Progress Notes (Signed)
Please inform patient of the following: ? ?Hemoglobin dropped a bit since last time as well as her iron-she should follow-up with hematology as we discussed.  This could explain why she is having low bit more swelling. ? ?Her thyroid level is normal.  Her cholesterol levels were normal as well. ? ?She had slight elevation of one of her liver numbers but this is stable compared to the values over the last few months.

## 2022-03-05 DIAGNOSIS — H04123 Dry eye syndrome of bilateral lacrimal glands: Secondary | ICD-10-CM | POA: Diagnosis not present

## 2022-03-05 DIAGNOSIS — Z961 Presence of intraocular lens: Secondary | ICD-10-CM | POA: Diagnosis not present

## 2022-03-05 DIAGNOSIS — E113212 Type 2 diabetes mellitus with mild nonproliferative diabetic retinopathy with macular edema, left eye: Secondary | ICD-10-CM | POA: Diagnosis not present

## 2022-03-05 DIAGNOSIS — H353131 Nonexudative age-related macular degeneration, bilateral, early dry stage: Secondary | ICD-10-CM | POA: Diagnosis not present

## 2022-03-05 DIAGNOSIS — Z20822 Contact with and (suspected) exposure to covid-19: Secondary | ICD-10-CM | POA: Diagnosis not present

## 2022-03-05 DIAGNOSIS — D3132 Benign neoplasm of left choroid: Secondary | ICD-10-CM | POA: Diagnosis not present

## 2022-03-05 LAB — HM DIABETES EYE EXAM

## 2022-03-06 ENCOUNTER — Ambulatory Visit (INDEPENDENT_AMBULATORY_CARE_PROVIDER_SITE_OTHER): Payer: Medicare Other | Admitting: Neurology

## 2022-03-06 ENCOUNTER — Telehealth: Payer: Self-pay | Admitting: Neurology

## 2022-03-06 ENCOUNTER — Encounter: Payer: Self-pay | Admitting: Neurology

## 2022-03-06 VITALS — BP 120/73 | HR 71 | Ht 71.0 in | Wt 179.4 lb

## 2022-03-06 DIAGNOSIS — G894 Chronic pain syndrome: Secondary | ICD-10-CM

## 2022-03-06 NOTE — Patient Instructions (Addendum)
?[facility] ? ? ? ?Provider:  Dr Jaynee Eagles ?Requesting Provider: Vivi Barrack, MD ?Primary Care Provider:  Vivi Barrack, MD ? ?CC:  multiple neurologic concerns ? ?Interval history 03/06/2022: Saw patient in 06/28/2021 for multiple neurologic complaints including neck pain and radiating pain in the right arm, wrist pain, shoulder pain, neck pain and had been going to [facility] neurology for years, starting in the neck and shoulder and radiating down to the first 4 fingers without strength in her right hand.  Also both feet have neuropathy, being treated by Dr. Erlinda Hong in orthopedics, also saw rheumatology. PMHx bariatric surgery, uterine cancer, coronary artery disease, diabetes, depression, headache, hepatomegaly, hyperlipidemia, hypertension, osteoarthritis, chronic nonalcoholic liver disease, peripheral neuropathy, is seeing Ortho for wrist pain, sees rheumatology diagnosed with pseudogout, primary osteoarthritis, trochanteric bursitis, age-related osteoporosis.She also saw a hand specialist.  ? ?We ordered MRI of the cervical spine and lumbar spine.Slightly abnormal MRI scan cervical spine without contrast showing mild disc degenerative changes at C4-5 and C5-6 but without significant compression.Abnormal MRI scan lumbar spine without contrast showing prominent disc and facet degenerative changes at L3-4 and L4-5 with bilateral foraminal and mild posterior canal narrowing but no definite compression.  Since I last saw her she has had multiple x-rays of her joints.  I reviewed notes from [facility] neurological, she was diagnosed with peripheral polyneuropathy asked to continue gabapentin and B12 over-the-counter, cervical radiculopathy and neck pain trigger injections as needed continue neck exercises, moderate left median mononeuropathy carpal tunnel syndrome continue nightly wrist splinting to reduce pain and increase function, all recommendations were for pain management techniques including lumbar  trigger point, exercises, medical management for peripheral polyneuropathy, trigger injections and other injections for cervical radiculopathy, left median mononeuropathy/carpal tunnel syndrome conservative exercises. She hurts all the time. ? ?HPI 06/28/2021:  Savannah [name] is a 66 y.o. female here as requested by Vivi Barrack, MD for multiple neurologic concerns. PMHx bariatric surgery, uterine cancer, coronary artery disease, diabetes, depression, headache, hepatomegaly, hyperlipidemia, hypertension, osteoarthritis, chronic nonalcoholic liver disease, peripheral neuropathy, is seeing Ortho for wrist pain, sees rheumatology diagnosed with pseudogout, primary osteoarthritis, trochanteric bursitis, age-related osteoporosis. ? ?She says she is here for multiple items. She says she has neck pain and radiating pain down the right arm. She thinks she has a pinched nerve in her neck. Dr. Erlinda Hong has been treating her, she got injections in the wrists, helped the left and not the right. She has been wearing the wrist brace since April/may. She has shoulder pain. She also has a lot of neck pain. She has been having this for years and has been under the care of [place] [facility]. Starts in the neck and shoulder and radiates down to the first 4 fingers. At first it was just the wrist, now also the arm. She has no strength in her right hand, she can't twist her arm. Both feet have neuropathy. Rubbing them helps. Voltaren gel helps. She is on gabapentin. Left one is always swollen after surgery. But both feet are affected. Can go up to ankles. Feels like something is hot on her leg radiating down. Can be symmetric. Can have sharp burning pain. Numbness and weakness in the legs with difficulty walking.  Poor balance. No other focal neurologic deficits, associated symptoms, inciting events or modifiable factors. ? ? ?Reviewed notes, labs and imaging from outside physicians, which showed: ? ?Hgba1c 7.0 ? ?Personally reviewed imaging  and agree with the following:  ?CT Head 12/16/2020 ?IMPRESSION: ?1.  No acute intracranial pathology. ?2. Mild chronic microvascular ischemic changes. ? ?I reviewed Dr. Phoebe Sharps (ortho) last notes from Ortho care, seeing her for chronic right wrist pain, last seen July 2022, chronic right wrist pain with underlying DRUJ and carpal tunnel syndrome.  Due to the patient's chronic symptoms not alleviated with conservative treatment, we will order an MRI to further assess for structural abnormalities.  In regards to the carpal tunnel syndrome she will tried and obtain records of the nerve conduction study did discuss at her follow-up appointment.  MRI of the wrist on the right without contrast was ordered. ? ?I also reviewed: Rheumatology notes: She was referred for inflammatory arthritis, last seen June 20, 2021: Has been seeing the pharmacist for counseling on Prolia, also so Dr. Estanislado Pandy in rheumatology  in July 2022 likely inflammatory arthritis RF negative, anti-CCP negative, 14 3 3  ETA negative, ANA negative HLA-B27 negative, uric acid 3.8, responsive to colchicine likely she has pseudogout. ? ?I reviewed MRI of the wrist and shoulder studies June 24, 2021: ? ?MRI Wrist: ?1. Severe tenosynovitis of the flexor digitorum tendon sheath just ?beyond the carpal tunnel. ?2. Severe degeneration and thinning of the TFCC with a ?full-thickness tear of the body. ?3. Severe osteoarthritis of the first [facility] joint. ?4. Moderate osteoarthritis of the radiocarpal joint. ?5. Mild osteoarthritis of the pisotriquetral joint. ?6. Synovitis of the midcarpal joint. ?7. Median nerve demonstrates normal signal but appears flattened ?within the carpal tunnel (image 9/series 3). ? ?MRI Shoulder ? ?IMPRESSION: ?1. Mild tendinosis of the supraspinatus tendon with fraying along ?the anterior bursal surface. ?2. Mild tendinosis of the infraspinatus tendon. ? ?Review of Systems: ?Patient complains of symptoms per HPI as well as the following  symptoms fatigue, swelling in legs, dryness, joint pain. Pertinent negatives and positives per HPI. All others negative. ? ? ?Social History  ? ?Socioeconomic History  ? Marital status: Single  ?  Spouse name: Not on file  ? Number of children: 0  ? Years of education: 23  ? Highest education level: Bachelor's degree (e.g., BA, AB, BS)  ?Occupational History  ? 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 Smoking status: Former  ?  Packs/day: 1.50  ?  Years: 15.00  ?  Pack years: 22.50  ?  Types: Cigarettes  ?  Quit date: 71  ?  Years since quitting: 31.3  ? Smokeless tobacco: Never  ?Vaping Use  ? Vaping Use: Never used  ?Substance and Sexual Activity  ? Alcohol use: Yes  ?  Comment: rarely  ? Drug use: Never  ? Sexual activity: Not Currently  ?Other Topics Concern  ? Not on file  ?Social History Narrative  ? Not on file  ? ?Social Determinants of Health  ? ?Financial Resource Strain: Not on file  ?Food Insecurity: Not on file  ?Transportation Needs: Not on file  ?Physical Activity: Not on file  ?Stress: Not on file  ?Social Connections: Not on file  ?Intimate Partner Violence: Not on file  ? ? ?Family History  ?Problem Relation Age of Onset  ? Diabetes Mother   ? Neuropathy Mother   ? Diabetes Father   ? Congestive Heart Failure Father   ? Hypertension Father   ? Myelodysplastic syndrome Father   ? Bladder Cancer Sister   ? Multiple sclerosis Sister   ? Breast cancer Sister   ?     breast onset age 63  ? Osteoporosis Sister   ? Liver cancer Maternal  Aunt   ? Breast cancer Maternal Aunt   ? Diabetes Maternal Aunt   ? Colon cancer Maternal Uncle   ? Diabetes Maternal Uncle   ? Neuropathy Paternal Aunt   ? Diabetes Maternal Grandmother   ? Diabetes Paternal Grandmother   ? ? ?Past Medical History:  ?Diagnosis Date  ? ABDOMINAL PAIN, CHRONIC 10/20/2009  ? Acute cystitis 08/17/2009  ? ALLERGIC RHINITIS 11/21/2007  ? Allergy   ? ASYMPTOMATIC POSTMENOPAUSAL STATUS 09/29/2008  ? Bariatric surgery  status 07/07/2010  ? Bariatric surgery status 07/07/2010  ? Qualifier: Diagnosis of  By: Loanne Drilling MD, Jacelyn Pi   ? Cancer [facility])   ? uterine  ? Cataract   ? Coronary artery disease   ? DEPRESSION 11/21/2007  ? DIABETES M

## 2022-03-06 NOTE — Telephone Encounter (Signed)
Referral for Pain Management  sent to [facility] [phone]. ?

## 2022-03-06 NOTE — Progress Notes (Signed)
?[facility] ? ? ? ?Provider:  Dr Ahern ?Requesting Provider: Parker, Caleb M, MD ?Primary Care Provider:  Parker, Caleb M, MD ? ?CC:  multiple neurologic concerns ? ?Interval history 03/06/2022: Saw patient in 06/28/2021 for multiple neurologic complaints including neck pain and radiating pain in the right arm, wrist pain, shoulder pain, neck pain and had been going to [place] [facility] for years, starting in the neck and shoulder and radiating down to the first 4 fingers without strength in her right hand.  Also both feet have neuropathy, being treated by Dr. XU in orthopedics, also saw rheumatology. PMHx bariatric surgery, uterine cancer, coronary artery disease, diabetes, depression, headache, hepatomegaly, hyperlipidemia, hypertension, osteoarthritis, chronic nonalcoholic liver disease, peripheral neuropathy, is seeing Ortho for wrist pain, sees rheumatology diagnosed with pseudogout, primary osteoarthritis, trochanteric bursitis, age-related osteoporosis.She also saw a hand specialist recently who recommended surgery. ? ?We ordered MRI of the cervical spine and lumbar spine.Slightly abnormal MRI scan cervical spine without contrast showing mild disc degenerative changes at C4-5 and C5-6 but without significant compression.Abnormal MRI scan lumbar spine without contrast showing prominent disc and facet degenerative changes at L3-4 and L4-5 with bilateral foraminal and mild posterior canal narrowing but no definite compression.  Since I last saw her she has had multiple x-rays of her joints.  I reviewed notes from [place] [facility], she was diagnosed with peripheral polyneuropathy asked to continue gabapentin and B12 over-the-counter, cervical radiculopathy and neck pain trigger injections as needed continue neck exercises, moderate left median mononeuropathy carpal tunnel syndrome continue nightly wrist splinting to reduce pain and increase function, all recommendations were for pain management  techniques including lumbar trigger point, exercises, medical management for peripheral polyneuropathy, trigger injections and other injections for cervical radiculopathy, left median mononeuropathy/carpal tunnel syndrome conservative exercises. She hurts all the time. ? ?HPI:  Savannah [name] is a 66 y.o. female here as requested by Parker, Caleb M, MD for multiple neurologic concerns. PMHx bariatric surgery, uterine cancer, coronary artery disease, diabetes, depression, headache, hepatomegaly, hyperlipidemia, hypertension, osteoarthritis, chronic nonalcoholic liver disease, peripheral neuropathy, is seeing Ortho for wrist pain, sees rheumatology diagnosed with pseudogout, primary osteoarthritis, trochanteric bursitis, age-related osteoporosis. ? ?She says she is here for multiple items. She says she has neck pain and radiating pain down the right arm. She thinks she has a pinched nerve in her neck. Dr. Xu has been treating her, she got injections in the wrists, helped the left and not the right. She has been wearing the wrist brace since April/may. She has shoulder pain. She also has a lot of neck pain. She has been having this for years and has been under the care of [place] [facility]. Starts in the neck and shoulder and radiates down to the first 4 fingers. At first it was just the wrist, now also the arm. She has no strength in her right hand, she can't twist her arm. Both feet have neuropathy. Rubbing them helps. Voltaren gel helps. She is on gabapentin. Left one is always swollen after surgery. But both feet are affected. Can go up to ankles. Feels like something is hot on her leg radiating down. Can be symmetric. Can have sharp burning pain. Numbness and weakness in the legs with difficulty walking.  Poor balance. No other focal neurologic deficits, associated symptoms, inciting events or modifiable factors. ? ? ?Reviewed notes, labs and imaging from outside physicians, which showed: ? ?Hgba1c 7.0 ? ?Personally  reviewed imaging and agree with the following:  ?CT Head 12/16/2020 ?  IMPRESSION: ?1. No acute intracranial pathology. ?2. Mild chronic microvascular ischemic changes. ? ?I reviewed Dr. Phoebe Sharps (ortho) last notes from Ortho care, seeing her for chronic right wrist pain, last seen July 2022, chronic right wrist pain with underlying DRUJ and carpal tunnel syndrome.  Due to the patient's chronic symptoms not alleviated with conservative treatment, we will order an MRI to further assess for structural abnormalities.  In regards to the carpal tunnel syndrome she will tried and obtain records of the nerve conduction study did discuss at her follow-up appointment.  MRI of the wrist on the right without contrast was ordered. ? ?I also reviewed: Rheumatology notes: She was referred for inflammatory arthritis, last seen June 20, 2021: Has been seeing the pharmacist for counseling on Prolia, also so Dr. Estanislado Pandy in rheumatology  in July 2022 likely inflammatory arthritis RF negative, anti-CCP negative, _0 ETA negative, ANA negative HLA-B27 negative, uric acid 3.8, responsive to colchicine likely she has pseudogout. ? ?I reviewed MRI of the wrist and shoulder studies June 24, 2021: ? ?MRI Wrist: ?1. Severe tenosynovitis of the flexor digitorum tendon sheath just ?beyond the carpal tunnel. ?2. Severe degeneration and thinning of the TFCC with a ?full-thickness tear of the body. ?3. Severe osteoarthritis of the first [facility] joint. ?4. Moderate osteoarthritis of the radiocarpal joint. ?5. Mild osteoarthritis of the pisotriquetral joint. ?6. Synovitis of the midcarpal joint. ?7. Median nerve demonstrates normal signal but appears flattened ?within the carpal tunnel (image 9/series 3). ? ?MRI Shoulder ? ?IMPRESSION: ?1. Mild tendinosis of the supraspinatus tendon with fraying along ?the anterior bursal surface. ?2. Mild tendinosis of the infraspinatus tendon. ? ?Review of Systems: ?Patient complains of symptoms per HPI as well as the  following symptoms fatigue, swelling in legs, dryness, joint pain. Pertinent negatives and positives per HPI. All others negative. ? ? ?Social History  ? ?Socioeconomic History  ? Marital status: Single  ?  Spouse name: Not on file  ? Number of children: 0  ? Years of education: 74  ? Highest education level: Bachelor's degree (e.g., BA, AB, BS)  ?Occupational History  ? 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 Smoking status: Former  ?  Packs/day: 1.50  ?  Years: 15.00  ?  Pack years: 22.50  ?  Types: Cigarettes  ?  Quit date: 47  ?  Years since quitting: 31.3  ? Smokeless tobacco: Never  ?Vaping Use  ? Vaping Use: Never used  ?Substance and Sexual Activity  ? Alcohol use: Yes  ?  Comment: rarely  ? Drug use: Never  ? Sexual activity: Not Currently  ?Other Topics Concern  ? Not on file  ?Social History Narrative  ? Not on file  ? ?Social Determinants of Health  ? ?Financial Resource Strain: Not on file  ?Food Insecurity: Not on file  ?Transportation Needs: Not on file  ?Physical Activity: Not on file  ?Stress: Not on file  ?Social Connections: Not on file  ?Intimate Partner Violence: Not on file  ? ? ?Family History  ?Problem Relation Age of Onset  ? Diabetes Mother   ? Neuropathy Mother   ? Diabetes Father   ? Congestive Heart Failure Father   ? Hypertension Father   ? Myelodysplastic syndrome Father   ? Bladder Cancer Sister   ? Multiple sclerosis Sister   ? Breast cancer Sister   ?     breast onset age 42  ? Osteoporosis Sister   ? Liver  cancer Maternal Aunt   ? Breast cancer Maternal Aunt   ? Diabetes Maternal Aunt   ? Colon cancer Maternal Uncle   ? Diabetes Maternal Uncle   ? Neuropathy Paternal Aunt   ? Diabetes Maternal Grandmother   ? Diabetes Paternal Grandmother   ? ? ?Past Medical History:  ?Diagnosis Date  ? ABDOMINAL PAIN, CHRONIC 10/20/2009  ? Acute cystitis 08/17/2009  ? ALLERGIC RHINITIS 11/21/2007  ? Allergy   ? ASYMPTOMATIC POSTMENOPAUSAL STATUS 09/29/2008  ? Bariatric  surgery status 07/07/2010  ? Bariatric surgery status 07/07/2010  ? Qualifier: Diagnosis of  By: Loanne Drilling MD, Jacelyn Pi   ? Cancer [facility])   ? uterine  ? Cataract   ? Coronary artery disease   ? DEPRESSION 01

## 2022-03-13 ENCOUNTER — Encounter: Payer: Self-pay | Admitting: Orthopedic Surgery

## 2022-03-13 ENCOUNTER — Ambulatory Visit (INDEPENDENT_AMBULATORY_CARE_PROVIDER_SITE_OTHER): Payer: Medicare Other | Admitting: Orthopedic Surgery

## 2022-03-13 DIAGNOSIS — M19031 Primary osteoarthritis, right wrist: Secondary | ICD-10-CM | POA: Diagnosis not present

## 2022-03-13 DIAGNOSIS — M1811 Unilateral primary osteoarthritis of first carpometacarpal joint, right hand: Secondary | ICD-10-CM

## 2022-03-13 DIAGNOSIS — G5603 Carpal tunnel syndrome, bilateral upper limbs: Secondary | ICD-10-CM

## 2022-03-13 MED ORDER — BETAMETHASONE SOD PHOS & ACET 6 (3-3) MG/ML IJ SUSP
3.0000 mg | INTRAMUSCULAR | Status: AC | PRN
  Administered 2022-03-13: 3 mg via INTRA_ARTICULAR

## 2022-03-13 MED ORDER — LIDOCAINE HCL 1 % IJ SOLN
0.5000 mL | INTRAMUSCULAR | Status: AC | PRN
  Administered 2022-03-13: .5 mL

## 2022-03-13 MED ORDER — BETAMETHASONE SOD PHOS & ACET 6 (3-3) MG/ML IJ SUSP
6.0000 mg | INTRAMUSCULAR | Status: AC | PRN
  Administered 2022-03-13: 6 mg via INTRA_ARTICULAR

## 2022-03-13 NOTE — Progress Notes (Signed)
? ?Office Visit Note ?  ?Patient: Savannah [name]           ?Date of Birth: [birth year]           ?MRN: [id number] ?Visit Date: 03/13/2022 ?             ?Requested by: Vivi Barrack, MD ?[place] ?[place],  [place] [address] ?PCP: Vivi Barrack, MD ? ? ?Assessment & Plan: ?Visit Diagnoses:  ?1. Arthritis of carpometacarpal (CMC) joint of right thumb   ?2. Primary osteoarthritis of right distal radioulnar joint   ?3. Primary osteoarthritis of right wrist   ?4. Bilateral carpal tunnel syndrome   ? ? ?Plan: Patient has multiple issues involving the right hand and wrist.  I saw her last in September at which time she underwent injection into the right DRUJ and thumb CMC joint.  She had significant relief for several months.  She is interested in doing a repeat injection of these joints today.  She also wants to discuss her carpal tunnel syndrome.  She had an EMG/nerve conduction study done in September 2021.  She does describe symptoms consistent with carpal tunnel syndrome.  It is a bit complicated by her history of diabetic polyneuropathy.  We discussed the treatment of carpal tunnel syndrome including bracing, corticosteroid injection, and carpal tunnel release.  She would like to try bracing for now as much of her problems seem to be nocturnal symptoms.  I can see her back in the office when today's corticosteroid injections no longer provide relief. ? ?Follow-Up Instructions: No follow-ups on file.  ? ?Orders:  ?No orders of the defined types were placed in this encounter. ? ?No orders of the defined types were placed in this encounter. ? ? ? ? Procedures: ?Hand/UE Inj: R thumb CMC for osteoarthritis on 03/13/2022 11:56 AM ?Indications: therapeutic and pain ?Details: 25 G needle, fluoroscopy-guided radial approach ?Medications: 0.5 mL lidocaine 1 %; 3 mg betamethasone acetate-betamethasone sodium phosphate 6 (3-3) MG/ML ?Procedure, treatment alternatives, risks and benefits explained, specific risks discussed. Consent  was given by the patient. Immediately prior to procedure a time out was called to verify the correct patient, procedure, equipment, support staff and site/side marked as required. Patient was prepped and draped in the usual sterile fashion.  ? ? ?Medium Joint Inj: R radiocarpal on 03/13/2022 11:56 AM ?Indications: pain ?Details: 25 G 1.5 in needle, fluoroscopy-guided dorsal approach ?Medications: 6 mg betamethasone acetate-betamethasone sodium phosphate 6 (3-3) MG/ML ?Procedure, treatment alternatives, risks and benefits explained, specific risks discussed. Consent was given by the patient. Immediately prior to procedure a time out was called to verify the correct patient, procedure, equipment, support staff and site/side marked as required. Patient was prepped and draped in the usual sterile fashion.  ? ? ? ? ?Clinical Data: ?No additional findings. ? ? ?Subjective: ?Chief Complaint  ?Patient presents with  ? Right Wrist - Follow-up  ? Right Hand - Follow-up  ? ? ?Is a 66 year old right-hand-dominant female who presents today with continued right wrist and hand pain with associated numbness and paresthesias.  She was last seen in September at which time she underwent corticosteroid injection into the right thumb CMC joint and into the right DRUJ.  She had several months of significant symptom relief with these injections.  Her symptoms have returned.  The pain remains at the base of the thumb and at the DRUJ.  She is unable to take oral NSAIDs secondary to previous bariatric surgery. She also describes numbness  and tingling in the median nerve distribution.  This been going on for some time now.  She does have nocturnal symptoms that can be as frequent as 3-4 nights per week.  She has not had any treatment for this so far.  She had an EMG/nerve conduction study done in September 21 which demonstrated moderate bilateral carpal tunnel syndrome in the setting of her diabetic polyneuropathy. ? ? ?Review of  Systems ? ? ?Objective: ?Vital Signs: There were no vitals taken for this visit. ? ?Physical Exam ?Constitutional:   ?   Appearance: Normal appearance.  ?Cardiovascular:  ?   Rate and Rhythm: Normal rate.  ?   Pulses: Normal pulses.  ?Pulmonary:  ?   Effort: Pulmonary effort is normal.  ?Skin: ?   General: Skin is warm and dry.  ?   Capillary Refill: Capillary refill takes less than 2 seconds.  ?Neurological:  ?   Mental Status: She is alert.  ? ? ?Right Hand Exam  ? ?Tenderness  ?Right hand tenderness location: TTP at thumb CMC joint, across dorsum of wrist, and at DRUJ.  Mild thumb CMC swelling.  No obvious wrist swelling. ? ?Other  ?Erythema: absent ?Sensation: normal ?Pulse: present ? ?Comments:  Senile purpura along dorsal aspect of forearm.  Significant pain and crepitus with CMC grind test.  No static or dynamic MP hyper-extension and no palmar abduction contracture. Pain w/ DRUJ compression test.   Positive Tinel and Phalen signs.  ? ? ? ? ?Specialty Comments:  ?No specialty comments available. ? ?Imaging: ?No results found. ? ? ?PMFS History: ?Patient Active Problem List  ? Diagnosis Date Noted  ? Bilateral carpal tunnel syndrome 03/13/2022  ? Leg edema 03/01/2022  ? Nondisplaced comminuted fracture of left patella, initial encounter for closed fracture 08/01/2021  ? Arthritis of carpometacarpal [facility]) joint of right thumb 07/28/2021  ? Primary osteoarthritis of right distal radioulnar joint 07/28/2021  ? Arthritis of wrist, right, degenerative 07/28/2021  ? Osteopenia 01/14/2021  ? Sesamoiditis of left foot 11/09/2020  ? Posterior tibial tendinitis of left lower extremity 11/09/2020  ? Type 2 diabetes mellitus with diabetic peripheral angiopathy without gangrene, with long-term current use of insulin ([place]) 10/21/2020  ? Iron deficiency anemia 03/14/2020  ? Lesion of vulva 09/02/2019  ? Menorrhagia 09/02/2019  ? Neuropathy 09/02/2019  ? Irregular bowel habits 06/25/2019  ? Coronary artery disease involving  native coronary artery of native heart without angina pectoris   ? Temporomandibular joint disorder 12/05/2018  ? Fatigue 12/05/2018  ? Inverted nipple 12/05/2018  ? Senile purpura ([place]) 11/21/2018  ? GERD with esophagitis 11/21/2018  ? Pseudogout involving multiple joints 11/21/2018  ? Lipoma 07/22/2017  ? Lichen sclerosus 54/07/8118  ? H/O gastric bypass 11/16/2016  ? GERD (gastroesophageal reflux disease) 11/16/2016  ? Depression 11/16/2016  ? Diabetic polyneuropathy associated with type 2 diabetes mellitus ([place]) 11/16/2016  ? Uterine cancer [facility]) s/p hysterectomy 1997 01/04/2012  ? Postsurgical hypothyroidism 09/19/2010  ? Diabetic neuropathy ([place]) 11/21/2007  ? Allergic rhinitis 11/21/2007  ? Dyslipidemia associated with type 2 diabetes mellitus ([place]) 07/08/2007  ? ?Past Medical History:  ?Diagnosis Date  ? ABDOMINAL PAIN, CHRONIC 10/20/2009  ? Acute cystitis 08/17/2009  ? ALLERGIC RHINITIS 11/21/2007  ? Allergy   ? ASYMPTOMATIC POSTMENOPAUSAL STATUS 09/29/2008  ? Bariatric surgery status 07/07/2010  ? Bariatric surgery status 07/07/2010  ? Qualifier: Diagnosis of  By: Loanne Drilling MD, Jacelyn Pi   ? Cancer [facility])   ? uterine  ? Cataract   ?  Coronary artery disease   ? DEPRESSION 11/21/2007  ? DIABETES MELLITUS, TYPE II 07/08/2007  ? DYSPNEA 05/20/2009  ? Edema 05/20/2009  ? Eosinophilic esophagitis 99/69/2493  ? FEVER UNSPECIFIED 10/20/2009  ? FEVER, HX OF 03/09/2010  ? GERD (gastroesophageal reflux disease)   ? Headache(784.0) 02/06/2010  ? Hepatomegaly 01/06/2008  ? HYPERLIPIDEMIA 07/08/2007  ? HYPERTENSION 07/08/2007  ? LEUKOPENIA, MILD 09/29/2008  ? OBESITY 01/06/2008  ? OSTEOARTHRITIS 01/06/2008  ? Other chronic nonalcoholic liver disease 24/19/9144  ? PERIPHERAL NEUROPATHY 11/21/2007  ? Peripheral neuropathy   ? Postsurgical hypothyroidism 09/19/2010  ? SINUSITIS- ACUTE-NOS 11/21/2007  ? TB SKIN TEST, POSITIVE 02/06/2010  ? THYROID NODULE 03/09/2010  ?  ?Family History  ?Problem Relation Age of Onset  ? Diabetes  Mother   ? Neuropathy Mother   ? Diabetes Father   ? Congestive Heart Failure Father   ? Hypertension Father   ? Myelodysplastic syndrome Father   ? Bladder Cancer Sister   ? Multiple sclerosis Sister   ? Breast can

## 2022-03-14 ENCOUNTER — Ambulatory Visit (INDEPENDENT_AMBULATORY_CARE_PROVIDER_SITE_OTHER): Payer: Medicare Other | Admitting: Orthopaedic Surgery

## 2022-03-14 ENCOUNTER — Encounter: Payer: Self-pay | Admitting: Orthopaedic Surgery

## 2022-03-14 ENCOUNTER — Encounter: Payer: Self-pay | Admitting: Family Medicine

## 2022-03-14 DIAGNOSIS — M79672 Pain in left foot: Secondary | ICD-10-CM | POA: Diagnosis not present

## 2022-03-14 NOTE — Progress Notes (Signed)
? ?Office Visit Note ?  ?Patient: Savannah [name]           ?Date of Birth: 24-Mar-1956           ?MRN: [id number] ?Visit Date: 03/14/2022 ?             ?Requested by: Vivi Barrack, MD ?[place] ?[place],  [place] [address] ?PCP: Vivi Barrack, MD ? ? ?Assessment & Plan: ?Visit Diagnoses:  ?1. Left foot pain   ? ? ?Plan: Impression is chronic left foot pain.  I believe her symptoms are likely posttraumatic from her calcaneal fracture.  I have made a referral to Dr. Sharol Given for further evaluation and treat recommendation.  Follow-up with [place] as needed. ? ?Follow-Up Instructions: Return if symptoms worsen or fail to improve.  ? ?Orders:  ?No orders of the defined types were placed in this encounter. ? ?No orders of the defined types were placed in this encounter. ? ? ? ? Procedures: ?No procedures performed ? ? ?Clinical Data: ?No additional findings. ? ? ?Subjective: ?Chief Complaint  ?Patient presents with  ? Left Foot - Pain  ? ? ?HPI patient is a pleasant 66 year old female who comes in today with chronic left foot pain.  This began about 3 years ago after undergoing surgery for plantar fasciitis by Dr. Amalia Hailey.  Soon after, she developed a fairly large calcaneal fracture.  She was immobilized for quite some time for this.  She is complained of left heel pain ever since.  The pain is constant but worse with any activity.  She has been taking occasional tramadol without significant relief.  She has paresthesias left foot but does have a history of neuropathy.  She has tried orthotics in the past which seem to make her symptoms worse.  She tells me that about 2 weeks ago she had a severe sunburn to the top of the left foot which developed blisters.  She was seen by her primary care provider where she was placed on antibiotics due to possible cellulitis.  She has since finished this and her swelling has returned to baseline.  Currently, no fevers or chills.  Of note, she tells me that she has had multiple ultrasounds of  the left lower extremity in the past which have all been negative for DVT. ? ?Review of Systems as detailed in HPI.  All others reviewed and are negative. ? ? ?Objective: ?Vital Signs: There were no vitals taken for this visit. ? ?Physical Exam well-developed well-nourished female no acute distress.  Alert and oriented x3. ? ?Ortho Exam left foot exam: 2+ pitting edema to the left lower extremity.  Calf is soft and nontender.  She has mild tenderness to the heel.  Somewhat limited range of motion.  Mild tenderness to the posterior tibial tendon.  No erythema, induration or other signs of cellulitis or infection.  ? ?Specialty Comments:  ?No specialty comments available. ? ?Imaging: ?No new imaging ? ? ?PMFS History: ?Patient Active Problem List  ? Diagnosis Date Noted  ? Bilateral carpal tunnel syndrome 03/13/2022  ? Leg edema 03/01/2022  ? Nondisplaced comminuted fracture of left patella, initial encounter for closed fracture 08/01/2021  ? Arthritis of carpometacarpal [facility]) joint of right thumb 07/28/2021  ? Primary osteoarthritis of right distal radioulnar joint 07/28/2021  ? Arthritis of wrist, right, degenerative 07/28/2021  ? Osteopenia 01/14/2021  ? Sesamoiditis of left foot 11/09/2020  ? Posterior tibial tendinitis of left lower extremity 11/09/2020  ? Type 2 diabetes  mellitus with diabetic peripheral angiopathy without gangrene, with long-term current use of insulin ([place]) 10/21/2020  ? Iron deficiency anemia 03/14/2020  ? Lesion of vulva 09/02/2019  ? Menorrhagia 09/02/2019  ? Neuropathy 09/02/2019  ? Irregular bowel habits 06/25/2019  ? Coronary artery disease involving native coronary artery of native heart without angina pectoris   ? Temporomandibular joint disorder 12/05/2018  ? Fatigue 12/05/2018  ? Inverted nipple 12/05/2018  ? Senile purpura ([place]) 11/21/2018  ? GERD with esophagitis 11/21/2018  ? Pseudogout involving multiple joints 11/21/2018  ? Lipoma 07/22/2017  ? Lichen sclerosus 35/57/3220  ? H/O  gastric bypass 11/16/2016  ? GERD (gastroesophageal reflux disease) 11/16/2016  ? Depression 11/16/2016  ? Diabetic polyneuropathy associated with type 2 diabetes mellitus ([place]) 11/16/2016  ? Uterine cancer [facility]) s/p hysterectomy 1997 01/04/2012  ? Postsurgical hypothyroidism 09/19/2010  ? Diabetic neuropathy ([place]) 11/21/2007  ? Allergic rhinitis 11/21/2007  ? Dyslipidemia associated with type 2 diabetes mellitus ([place]) 07/08/2007  ? ?Past Medical History:  ?Diagnosis Date  ? ABDOMINAL PAIN, CHRONIC 10/20/2009  ? Acute cystitis 08/17/2009  ? ALLERGIC RHINITIS 11/21/2007  ? Allergy   ? ASYMPTOMATIC POSTMENOPAUSAL STATUS 09/29/2008  ? Bariatric surgery status 07/07/2010  ? Bariatric surgery status 07/07/2010  ? Qualifier: Diagnosis of  By: Loanne Drilling MD, Jacelyn Pi   ? Cancer [facility])   ? uterine  ? Cataract   ? Coronary artery disease   ? DEPRESSION 11/21/2007  ? DIABETES MELLITUS, TYPE II 07/08/2007  ? DYSPNEA 05/20/2009  ? Edema 05/20/2009  ? Eosinophilic esophagitis 25/42/7062  ? FEVER UNSPECIFIED 10/20/2009  ? FEVER, HX OF 03/09/2010  ? GERD (gastroesophageal reflux disease)   ? Headache(784.0) 02/06/2010  ? Hepatomegaly 01/06/2008  ? HYPERLIPIDEMIA 07/08/2007  ? HYPERTENSION 07/08/2007  ? LEUKOPENIA, MILD 09/29/2008  ? OBESITY 01/06/2008  ? OSTEOARTHRITIS 01/06/2008  ? Other chronic nonalcoholic liver disease 37/62/8315  ? PERIPHERAL NEUROPATHY 11/21/2007  ? Peripheral neuropathy   ? Postsurgical hypothyroidism 09/19/2010  ? SINUSITIS- ACUTE-NOS 11/21/2007  ? TB SKIN TEST, POSITIVE 02/06/2010  ? THYROID NODULE 03/09/2010  ?  ?Family History  ?Problem Relation Age of Onset  ? Diabetes Mother   ? Neuropathy Mother   ? Diabetes Father   ? Congestive Heart Failure Father   ? Hypertension Father   ? Myelodysplastic syndrome Father   ? Bladder Cancer Sister   ? Multiple sclerosis Sister   ? Breast cancer Sister   ?     breast onset age 50  ? Osteoporosis Sister   ? Liver cancer Maternal Aunt   ? Breast cancer Maternal Aunt   ?  Diabetes Maternal Aunt   ? Colon cancer Maternal Uncle   ? Diabetes Maternal Uncle   ? Neuropathy Paternal Aunt   ? Diabetes Maternal Grandmother   ? Diabetes Paternal Grandmother   ?  ?Past Surgical History:  ?Procedure Laterality Date  ? ABDOMINAL HYSTERECTOMY    ? BASAL CELL CARCINOMA EXCISION    ? CARDIAC CATHETERIZATION    ? CHOLECYSTECTOMY N/A 05/11/2021  ? Procedure: LAPAROSCOPIC CHOLECYSTECTOMY WITH POSSIBLE  INTRAOPERATIVE CHOLANGIOGRAM;  Surgeon: Johnathan Hausen, MD;  Location: [facility];  Service: General;  Laterality: N/A;  ? COLONOSCOPY    ? EYE SURGERY Bilateral 08/2018, 09/2018  ? FOOT SURGERY Left   ? 10/2019  ? KNEE ARTHROSCOPY Left   ? LEFT HEART CATH AND CORONARY ANGIOGRAPHY N/A 01/20/2019  ? Procedure: LEFT HEART CATH AND CORONARY ANGIOGRAPHY;  Surgeon: Belva Crome, MD;  Location: [facility];  Service: Cardiovascular;  Laterality: N/A;  ? LYMPH NODE DISSECTION    ? OOPHORECTOMY    ? Right total knee replacement    ? ROUX-EN-Y GASTRIC BYPASS  2011  ? SKIN TAG REMOVAL    ? THYROIDECTOMY    ? TONSILLECTOMY AND ADENOIDECTOMY    ? UPPER GASTROINTESTINAL ENDOSCOPY    ? ?Social History  ? ?Occupational History  ? 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 Smoking status: Former  ?  Packs/day: 1.50  ?  Years: 15.00  ?  Pack years: 22.50  ?  Types: Cigarettes  ?  Quit date: 34  ?  Years since quitting: 31.3  ? Smokeless tobacco: Never  ?Vaping Use  ? Vaping Use: Never used  ?Substance and Sexual Activity  ? Alcohol use: Yes  ?  Comment: rarely  ? Drug use: Never  ? Sexual activity: Not Currently  ? ? ? ? ? ? ?

## 2022-03-22 ENCOUNTER — Ambulatory Visit (INDEPENDENT_AMBULATORY_CARE_PROVIDER_SITE_OTHER): Payer: Medicare Other | Admitting: Orthopedic Surgery

## 2022-03-22 DIAGNOSIS — M216X2 Other acquired deformities of left foot: Secondary | ICD-10-CM | POA: Diagnosis not present

## 2022-03-22 DIAGNOSIS — M79672 Pain in left foot: Secondary | ICD-10-CM

## 2022-03-22 DIAGNOSIS — S92015A Nondisplaced fracture of body of left calcaneus, initial encounter for closed fracture: Secondary | ICD-10-CM | POA: Diagnosis not present

## 2022-03-23 ENCOUNTER — Encounter: Payer: Self-pay | Admitting: Gastroenterology

## 2022-03-24 ENCOUNTER — Encounter: Payer: Self-pay | Admitting: Orthopedic Surgery

## 2022-03-24 NOTE — Progress Notes (Signed)
? ?Office Visit Note ?  ?Patient: Savannah [name]           ?Date of Birth: 08/15/1956           ?MRN: [id number] ?Visit Date: 03/22/2022 ?             ?Requested by: Vivi Barrack, MD ?[address] ?[place],  [place] [address] ?PCP: Vivi Barrack, MD ?Patient complains of swelling in her leg and persistent heel pain and foot pain. ? ?Patient states she has had bariatric surgery.  And also reports a chronic leg length inequality with the left leg shorter than the right. ?Chief Complaint  ?Patient presents with  ? Left Foot - Pain  ? ? ? ? ?HPI: ?Patient is a 66 year old woman who is seen for initial evaluation for left heel pain.  Patient reports undergoing plantar fascia injections for plantar fasciitis.  Patient states that she had 5 rounds of injections 5 times a week that lasted a month and then had 7 additional injections.  Patient states the injections were for the plantar fasciitis and heel spur.  Patient subsequently underwent excision of the heel spur and plantar fascial release.  Subsequently patient developed a calcaneal fracture. ? ?Assessment & Plan: ?Visit Diagnoses:  ?1. Left foot pain   ?2. Plantar fat pad atrophy of left foot   ?3. Closed nondisplaced fracture of body of left calcaneus, initial encounter   ? ? ?Plan: Discussed with the patient the fat atrophy and fracture were most likely secondary to the steroid injections.  Recommended sole orthotics to allow for better support of the arch.  Would cut out the first metatarsal area and great toe to allow for plantarflexion of the first ray.  Recommended a gel heel insert to help with the fat atrophy of the heel. ? ?Follow-Up Instructions: Return if symptoms worsen or fail to improve.  ? ?Ortho Exam ? ?Patient is alert, oriented, no adenopathy, well-dressed, normal affect, normal respiratory effort. ?Review of the radiographs shows an aggressive partial calcaneal excision where the heel spur would be.  There is also insufficiency fracture lines through  the calcaneus.  There is atrophy of the heel pad. ? ?Patient has plantarflexed first ray with a cavovarus foot with callus and pain beneath the first metatarsal head.  Patient has a palpable atrophy of the heel pad.  There is pitting edema in the leg and ankle.  She has no subtalar pain no pain to palpation over the sinus Tarsi no pain with inversion and eversion.  Patient has a healed medial incision from her surgery. ? ?Imaging: ?No results found. ?No images are attached to the encounter. ? ?Labs: ?Lab Results  ?Component Value Date  ? HGBA1C 7.0 (A) 01/16/2022  ? HGBA1C 8.7 (A) 10/16/2021  ? HGBA1C 7.0 (H) 05/13/2021  ? ESRSEDRATE 17 06/28/2021  ? ESRSEDRATE 22 01/10/2018  ? ESRSEDRATE 32 (H) 11/07/2017  ? CRP 0.6 01/10/2018  ? CRP 0.3 (L) 11/07/2017  ? CRP 0.3 02/07/2010  ? LABURIC 3.8 01/10/2018  ? LABURIC 4.7 08/16/2017  ? REPTSTATUS 05/15/2021 FINAL 05/09/2021  ? CULT  05/09/2021  ?  NO GROWTH 5 DAYS ?Performed at [facility], [place] [address], [place], [place] [address] ?  ? LABORGA ENTEROCOCCUS FAECALIS (A) 05/09/2021  ? ? ? ?Lab Results  ?Component Value Date  ? ALBUMIN 3.7 03/01/2022  ? ALBUMIN 4.0 10/09/2021  ? ALBUMIN 4.0 06/28/2021  ? ? ?Lab Results  ?Component Value Date  ? MG 2.2  03/21/2018  ? MG 2.1 06/07/2017  ? ?Lab Results  ?Component Value Date  ? VD25OH 62.93 07/04/2018  ? VD25OH 44.00 06/07/2017  ? VD25OH 41 04/04/2012  ? ? ?No results found for: PREALBUMIN ? ?  Latest Ref Rng & Units 03/01/2022  ? 11:12 AM 11/20/2021  ? 11:59 AM 06/28/2021  ?  4:55 PM  ?CBC EXTENDED  ?WBC 4.0 - 10.5 K/uL 3.1   3.1   3.3    ?RBC 3.87 - 5.11 Mil/uL 3.61   3.84   3.85    ?Hemoglobin 12.0 - 15.0 g/dL 11.7   12.5   12.3    ?HCT 36.0 - 46.0 % 35.2   37.5   36.7    ?Platelets 150.0 - 400.0 K/uL 125.0   146   175    ?NEUT# 1,500 - 7,800 cells/uL  1,364   1.5    ?Lymph# 850 - 3,900 cells/uL  1,097   1.2    ? ? ? ?There is no height or weight on file to calculate BMI. ? ?Orders:  ?No orders of the defined types were  placed in this encounter. ? ?No orders of the defined types were placed in this encounter. ? ? ? Procedures: ?No procedures performed ? ?Clinical Data: ?No additional findings. ? ?ROS: ? ?All other systems negative, except as noted in the HPI. ?Review of Systems ? ?Objective: ?Vital Signs: There were no vitals taken for this visit. ? ?Specialty Comments:  ?No specialty comments available. ? ?PMFS History: ?Patient Active Problem List  ? Diagnosis Date Noted  ? Bilateral carpal tunnel syndrome 03/13/2022  ? Leg edema 03/01/2022  ? Nondisplaced comminuted fracture of left patella, initial encounter for closed fracture 08/01/2021  ? Arthritis of carpometacarpal [facility]) joint of right thumb 07/28/2021  ? Primary osteoarthritis of right distal radioulnar joint 07/28/2021  ? Arthritis of wrist, right, degenerative 07/28/2021  ? Osteopenia 01/14/2021  ? Sesamoiditis of left foot 11/09/2020  ? Posterior tibial tendinitis of left lower extremity 11/09/2020  ? Type 2 diabetes mellitus with diabetic peripheral angiopathy without gangrene, with long-term current use of insulin ([place]) 10/21/2020  ? Iron deficiency anemia 03/14/2020  ? Lesion of vulva 09/02/2019  ? Menorrhagia 09/02/2019  ? Neuropathy 09/02/2019  ? Irregular bowel habits 06/25/2019  ? Coronary artery disease involving native coronary artery of native heart without angina pectoris   ? Temporomandibular joint disorder 12/05/2018  ? Fatigue 12/05/2018  ? Inverted nipple 12/05/2018  ? Senile purpura ([place]) 11/21/2018  ? GERD with esophagitis 11/21/2018  ? Pseudogout involving multiple joints 11/21/2018  ? Lipoma 07/22/2017  ? Lichen sclerosus 34/91/7915  ? H/O gastric bypass 11/16/2016  ? GERD (gastroesophageal reflux disease) 11/16/2016  ? Depression 11/16/2016  ? Diabetic polyneuropathy associated with type 2 diabetes mellitus ([place]) 11/16/2016  ? Uterine cancer [facility]) s/p hysterectomy 1997 01/04/2012  ? Postsurgical hypothyroidism 09/19/2010  ? Diabetic neuropathy ([place])  11/21/2007  ? Allergic rhinitis 11/21/2007  ? Dyslipidemia associated with type 2 diabetes mellitus ([place]) 07/08/2007  ? ?Past Medical History:  ?Diagnosis Date  ? ABDOMINAL PAIN, CHRONIC 10/20/2009  ? Acute cystitis 08/17/2009  ? ALLERGIC RHINITIS 11/21/2007  ? Allergy   ? ASYMPTOMATIC POSTMENOPAUSAL STATUS 09/29/2008  ? Bariatric surgery status 07/07/2010  ? Bariatric surgery status 07/07/2010  ? Qualifier: Diagnosis of  By: Loanne Drilling MD, Jacelyn Pi   ? Cancer [facility])   ? uterine  ? Cataract   ? Coronary artery disease   ? DEPRESSION 11/21/2007  ? DIABETES MELLITUS, TYPE  II 07/08/2007  ? DYSPNEA 05/20/2009  ? Edema 05/20/2009  ? Eosinophilic esophagitis 16/55/3748  ? FEVER UNSPECIFIED 10/20/2009  ? FEVER, HX OF 03/09/2010  ? GERD (gastroesophageal reflux disease)   ? Headache(784.0) 02/06/2010  ? Hepatomegaly 01/06/2008  ? HYPERLIPIDEMIA 07/08/2007  ? HYPERTENSION 07/08/2007  ? LEUKOPENIA, MILD 09/29/2008  ? OBESITY 01/06/2008  ? OSTEOARTHRITIS 01/06/2008  ? Other chronic nonalcoholic liver disease 27/05/8674  ? PERIPHERAL NEUROPATHY 11/21/2007  ? Peripheral neuropathy   ? Postsurgical hypothyroidism 09/19/2010  ? SINUSITIS- ACUTE-NOS 11/21/2007  ? TB SKIN TEST, POSITIVE 02/06/2010  ? THYROID NODULE 03/09/2010  ?  ?Family History  ?Problem Relation Age of Onset  ? Diabetes Mother   ? Neuropathy Mother   ? Diabetes Father   ? Congestive Heart Failure Father   ? Hypertension Father   ? Myelodysplastic syndrome Father   ? Bladder Cancer Sister   ? Multiple sclerosis Sister   ? Breast cancer Sister   ?     breast onset age 25  ? Osteoporosis Sister   ? Liver cancer Maternal Aunt   ? Breast cancer Maternal Aunt   ? Diabetes Maternal Aunt   ? Colon cancer Maternal Uncle   ? Diabetes Maternal Uncle   ? Neuropathy Paternal Aunt   ? Diabetes Maternal Grandmother   ? Diabetes Paternal Grandmother   ?  ?Past Surgical History:  ?Procedure Laterality Date  ? ABDOMINAL HYSTERECTOMY    ? BASAL CELL CARCINOMA EXCISION    ? CARDIAC  CATHETERIZATION    ? CHOLECYSTECTOMY N/A 05/11/2021  ? Procedure: LAPAROSCOPIC CHOLECYSTECTOMY WITH POSSIBLE  INTRAOPERATIVE CHOLANGIOGRAM;  Surgeon: Johnathan Hausen, MD;  Location: [facility];  Service: General;

## 2022-03-26 ENCOUNTER — Other Ambulatory Visit: Payer: Self-pay | Admitting: Endocrinology

## 2022-03-26 ENCOUNTER — Telehealth: Payer: Self-pay | Admitting: Orthopedic Surgery

## 2022-03-26 ENCOUNTER — Other Ambulatory Visit: Payer: Self-pay | Admitting: Orthopedic Surgery

## 2022-03-26 MED ORDER — TRAMADOL HCL 50 MG PO TABS: 50.0000 mg | ORAL_TABLET | Freq: Four times a day (QID) | ORAL | 0 refills | Status: DC | PRN

## 2022-03-26 NOTE — Telephone Encounter (Signed)
Patient called needing Rx refilled Tramadol. I advised patient the message was sent to Dr. Sharol Given and waiting for a response from the doctor. The number to contact patient is [phone] ?

## 2022-03-26 NOTE — Telephone Encounter (Signed)
err

## 2022-03-26 NOTE — Telephone Encounter (Signed)
Waiting on MD to send in narcotic. I will close out this message and let her know through Mychart once sent in. ?

## 2022-04-01 ENCOUNTER — Other Ambulatory Visit: Payer: Self-pay | Admitting: Physician Assistant

## 2022-04-01 ENCOUNTER — Other Ambulatory Visit: Payer: Self-pay | Admitting: Rheumatology

## 2022-04-02 NOTE — Telephone Encounter (Signed)
Next Visit: 05/21/2022   Last Visit: 11/20/2021   Last Fill: 12/24/2021  DX: DDD lumbar spine   Current Dose per office note 11/20/2021: not discussed  Okay to refill Lidocaine patches?

## 2022-04-02 NOTE — Telephone Encounter (Signed)
Next Visit: 05/21/2022  Last Visit: 11/20/2021  Last Fill: 01/10/2022  DX: Inflammatory arthritis   Current Dose per office note 11/20/2021: colchicine 0.6 mg 1 capsule by mouth daily  Labs: 03/01/2022 Glucose 275, ALT 47, Total Protein 5.4, GFR 56.80, Calcium 8.2, WBC 3.1, RBC 3.61, Platelets 125, Hgb 11.7, Hct 35.2  Okay to refill Colchicine?

## 2022-04-23 ENCOUNTER — Encounter: Payer: Self-pay | Admitting: Family

## 2022-04-23 ENCOUNTER — Ambulatory Visit (INDEPENDENT_AMBULATORY_CARE_PROVIDER_SITE_OTHER): Payer: Medicare Other | Admitting: Family

## 2022-04-23 VITALS — BP 114/65 | HR 67 | Temp 98.1°F | Ht 71.0 in | Wt 178.0 lb

## 2022-04-23 DIAGNOSIS — R600 Localized edema: Secondary | ICD-10-CM | POA: Diagnosis not present

## 2022-04-23 MED ORDER — FUROSEMIDE 20 MG PO TABS: 20.0000 mg | ORAL_TABLET | Freq: Every day | ORAL | 2 refills | Status: DC

## 2022-04-23 NOTE — Assessment & Plan Note (Signed)
Chronic - takes Lasix prn, reports travelling a lot lately by car and standing more than usual. Does not like to wear compression socks, does not think they help. Reports Lasix only helps if she takes at night and keeps legs elevated. Has never been told she has lymphedema, last vascular testing done years ago per pt. Will reorder today. Advised to resume Lasix qd, refill sent, elevate legs whenever resting, should wear compression socks whenever travelling or standing for long periods.

## 2022-04-23 NOTE — Patient Instructions (Addendum)
It was very nice to see you today!   I have sent a refill of your Lasix.  I also have ordered new vascular studies. They will call you to schedule.  If this indicates lymphedema, I can refer you to one of our physical therapists trained in massage treatment and you can ask your insurance company if they will cover the automated leg compression wraps. Be sure to hydrate with 2 liters of water daily. Wear compression socks when sitting or standing for long periods.      PLEASE NOTE:  If you had any lab tests please let us know if you have not heard back within a few days. You may see your results on MyChart before we have a chance to review them but we will give you a call once they are reviewed by [place]. If we ordered any referrals today, please let us know if you have not heard from their office within the next week.

## 2022-04-23 NOTE — Progress Notes (Signed)
Subjective:     Patient ID: Savannah [name], female    DOB: [birth year], 66 y.o.   MRN: [id number]  Chief Complaint  Patient presents with   Leg Swelling    Pt c/o left leg swelling and redness. Pt states it is constant pain with limited flexibility. This has been an ongoing issue for years. Has been taking tramadol.     HPI: Edema: Patient complains of edema. The location of the edema is lower leg(s) bilateral.  The edema has been moderate.  Onset of symptoms was 2 weeks ago, gradually worsening since that time. The edema is present all day. The patient states the problem is long-standing.  The swelling has been aggravated by dependency of involved area and flying or long car trips, relieved by elevation of involved area, and been associated with nothing. Cardiac risk factors include advanced age (older than 60 for men, 41 for women) and diabetes mellitus, & hyperlipidemia.   Assessment & Plan:   Problem List Items Addressed This Visit       Other   Leg edema - Primary    Chronic - takes Lasix prn, reports travelling a lot lately by car and standing more than usual. Does not like to wear compression socks, does not think they help. Reports Lasix only helps if she takes at night and keeps legs elevated. Has never been told she has lymphedema, last vascular testing done years ago per pt. Will reorder today. Advised to resume Lasix qd, refill sent, elevate legs whenever resting, should wear compression socks whenever travelling or standing for long periods.      Relevant Medications   furosemide (LASIX) 20 MG tablet   Other Relevant Orders   US Venous Img Lower Bilateral (DVT)   [place] Lower Ext Art Bilat    Outpatient Medications Prior to Visit  Medication Sig Dispense Refill   acetaminophen (TYLENOL) 500 MG tablet Take 2 tablets (1,000 mg total) by mouth every 6 (six) hours as needed for mild pain or fever. 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  Correct leg length and improve pronation BL 1 each 0   aspirin EC 81 MG tablet Take 1 tablet (81 mg total) by mouth daily. Starting 01/18/19 prior to cardiac cath     atorvastatin (LIPITOR) 40 MG tablet Take 1 tablet (40 mg total) by mouth daily. 90 tablet 3   Cholecalciferol (VITAMIN D) 50 MCG (2000 UT) tablet Take 4,000 Units by mouth daily.     CLINPRO 5000 1.1 % PSTE See admin instructions.     colchicine 0.6 MG tablet TAKE 1 TABLET BY MOUTH EVERY DAY 90 tablet 0   cyclobenzaprine (FLEXERIL) 10 MG tablet Take 10 mg by mouth at bedtime as needed for muscle spasms.     desloratadine (CLARINEX) 5 MG tablet Take 5 mg by mouth daily.     diclofenac sodium (VOLTAREN) 1 % GEL Apply 4 g topically 4 (four) times daily. 100 g 11   docusate sodium (COLACE) 100 MG capsule Take 200 mg by mouth 2 (two) times daily.     Dulaglutide (TRULICITY) 4.5 AS/3.4HD SOPN Inject 4.5 mg as directed once a week. 6 mL 3   gabapentin (NEURONTIN) 600 MG tablet TAKE 2 TABLETS BY MOUTH 3 TIMES DAILY. 180 tablet 1   halobetasol (ULTRAVATE) 0.05 % cream Apply 1 application topically 2 (two) times daily as needed (lichen sclerosus).   2   levothyroxine (SYNTHROID) 112 MCG  tablet TAKE 1 TABLET BY MOUTH EVERY DAY 90 tablet 2   lidocaine (LIDODERM) 5 % PLACE 1 PATCH ONTO THE SKIN DAILY. REMOVE & DISCARD PATCH WITHIN 12 HOURS OR AS DIRECTED BY MD 30 patch 0   MAGNESIUM PO Take 4 tablets by mouth daily as needed (cramps).     metFORMIN (GLUCOPHAGE-XR) 500 MG 24 hr tablet Take 4 tablets (2,000 mg total) by mouth daily. 360 tablet 3   Multiple Vitamin (MULTIVITAMIN) tablet Take 1 tablet by mouth in the morning and at bedtime.     nitroGLYCERIN (NITROSTAT) 0.4 MG SL tablet Place 1 tablet (0.4 mg total) under the tongue every 5 (five) minutes as needed. 25 tablet 3   ONETOUCH ULTRA test strip USE TO MONITOR GLUCOSE LEVELS ONCE PER DAY E11.40 100 strip 2   OXYCODONE HCL PO Take by mouth as needed.     pantoprazole (PROTONIX)  40 MG tablet TAKE 1 TABLET BY MOUTH TWICE A DAY 180 tablet 1   Probiotic CAPS Take 1 capsule by mouth daily.     repaglinide (PRANDIN) 2 MG tablet Take 1 tablet (2 mg total) by mouth 3 (three) times daily before meals. 270 tablet 3   traMADol (ULTRAM) 50 MG tablet Take 1 tablet (50 mg total) by mouth every 6 (six) hours as needed for moderate pain. 30 tablet 0   TRAMADOL HCL PO Take by mouth as needed.     doxycycline (VIBRA-TABS) 100 MG tablet Take 1 tablet (100 mg total) by mouth 2 (two) times daily. 14 tablet 0   Furosemide (LASIX PO) Take by mouth. Takes PRN     No facility-administered medications prior to visit.    Past Medical History:  Diagnosis Date   ABDOMINAL PAIN, CHRONIC 10/20/2009   Acute cystitis 08/17/2009   ALLERGIC RHINITIS 11/21/2007   Allergy    ASYMPTOMATIC POSTMENOPAUSAL STATUS 09/29/2008   Bariatric surgery status 07/07/2010   Bariatric surgery status 07/07/2010   Qualifier: Diagnosis of  By: Loanne Drilling MD, Sean A    Cancer [facility])    uterine   Cataract    Coronary artery disease    DEPRESSION 11/21/2007   DIABETES MELLITUS, TYPE II 07/08/2007   DYSPNEA 05/20/2009   Edema 70/48/8891   Eosinophilic esophagitis 69/45/0388   FEVER UNSPECIFIED 10/20/2009   FEVER, HX OF 03/09/2010   GERD (gastroesophageal reflux disease)    Headache(784.0) 02/06/2010   Hepatomegaly 01/06/2008   HYPERLIPIDEMIA 07/08/2007   HYPERTENSION 07/08/2007   LEUKOPENIA, MILD 09/29/2008   OBESITY 01/06/2008   OSTEOARTHRITIS 01/06/2008   Other chronic nonalcoholic liver disease 82/80/0349   PERIPHERAL NEUROPATHY 11/21/2007   Peripheral neuropathy    Postsurgical hypothyroidism 09/19/2010   SINUSITIS- ACUTE-NOS 11/21/2007   TB SKIN TEST, POSITIVE 02/06/2010   THYROID NODULE 03/09/2010    Past Surgical History:  Procedure Laterality Date   ABDOMINAL HYSTERECTOMY     BASAL CELL CARCINOMA EXCISION     CARDIAC CATHETERIZATION     CHOLECYSTECTOMY N/A 05/11/2021   Procedure:  LAPAROSCOPIC CHOLECYSTECTOMY WITH POSSIBLE  INTRAOPERATIVE CHOLANGIOGRAM;  Surgeon: Johnathan Hausen, MD;  Location: [facility];  Service: General;  Laterality: N/A;   COLONOSCOPY     EYE SURGERY Bilateral 08/2018, 09/2018   FOOT SURGERY Left    10/2019   KNEE ARTHROSCOPY Left    LEFT HEART CATH AND CORONARY ANGIOGRAPHY N/A 01/20/2019   Procedure: LEFT HEART CATH AND CORONARY ANGIOGRAPHY;  Surgeon: Belva Crome, MD;  Location: [facility];  Service: Cardiovascular;  Laterality: N/A;  LYMPH NODE DISSECTION     OOPHORECTOMY     Right total knee replacement     ROUX-EN-Y GASTRIC BYPASS  2011   SKIN TAG REMOVAL     THYROIDECTOMY     TONSILLECTOMY AND ADENOIDECTOMY     UPPER GASTROINTESTINAL ENDOSCOPY      Allergies  Allergen Reactions   Ace Inhibitors Cough   Caffeine     PVCs   Ciprofloxacin Itching   Morphine And Related Itching    Pt tolerates medication if given with diphenhydramine   Mucinex [Guaifenesin Er]     PVCs   Nsaids     Gastric Bypass Surgery - unable to take, tolerates ec 81 aspirin    Other     Antihistamine-alkylamine - PVCs tolerates benadryl    Silicone Hives   Tape Hives       Objective:    Physical Exam Vitals and nursing note reviewed.  Constitutional:      Appearance: Normal appearance.  Cardiovascular:     Rate and Rhythm: Normal rate and regular rhythm.  Pulmonary:     Effort: Pulmonary effort is normal.     Breath sounds: Normal breath sounds.  Musculoskeletal:        General: Normal range of motion.     Right lower leg: 2+ Pitting Edema present.     Left lower leg: 4+ Pitting Edema present.  Skin:    General: Skin is warm and dry.  Neurological:     Mental Status: She is alert.  Psychiatric:        Mood and Affect: Mood normal.        Behavior: Behavior normal.     BP 114/65 (BP Location: Left Arm, Patient Position: Sitting, Cuff Size: Large)   Pulse 67   Temp 98.1 F (36.7 C) (Temporal)   Ht 5' 11"  (1.803 m)   Wt 178 lb  (80.7 kg)   SpO2 98%   BMI 24.83 kg/m  Wt Readings from Last 3 Encounters:  04/23/22 178 lb (80.7 kg)  03/06/22 179 lb 6.4 oz (81.4 kg)  03/01/22 176 lb 12.8 oz (80.2 kg)       Meds ordered this encounter  Medications   furosemide (LASIX) 20 MG tablet    Sig: Take 1 tablet (20 mg total) by mouth daily.    Dispense:  30 tablet    Refill:  2    Order Specific Question:   Supervising Provider    Answer:   ANDY, CAMILLE L [[id number]]    [name], NP

## 2022-04-26 ENCOUNTER — Telehealth: Payer: Self-pay | Admitting: Internal Medicine

## 2022-04-26 NOTE — Telephone Encounter (Signed)
Pt c/o Shortness Of Breath: STAT if SOB developed within the last 24 hours or pt is noticeably SOB on the phone  1. Are you currently SOB (can you hear that pt is SOB on the phone)? no  2. How long have you been experiencing SOB? Several days   3. Are you SOB when sitting or when up moving around? Mostly when moving around  4. Are you currently experiencing any other symptoms? No   Pt states that her SOB is progressively worsening. She made an appt with [name] 05/01/22.

## 2022-04-26 NOTE — Telephone Encounter (Signed)
Left message to call back  

## 2022-04-27 NOTE — Telephone Encounter (Signed)
Patient returned RN's call. 

## 2022-04-27 NOTE — Telephone Encounter (Signed)
LMTCB

## 2022-04-27 NOTE — Telephone Encounter (Signed)
Pt returning nurse's call. Please advise

## 2022-04-27 NOTE — Telephone Encounter (Signed)
Patient reports increase in left leg edema. She saw PCP on 6/12. She was ordered lasix 20 mg bid for three days but only took one. She is off for three days from work and will take 40 mg for three days. She reports sob, even when just sitting. She will take a deep breath and hold it, "to absorb oxygen" and then feels better. She has felt a "twinge" in her chest for a couple of days. She believes she may need another stent. Recommended that she go to the ED to be evaluated. She voiced understanding.

## 2022-05-01 ENCOUNTER — Ambulatory Visit (INDEPENDENT_AMBULATORY_CARE_PROVIDER_SITE_OTHER): Payer: Medicare Other | Admitting: Family

## 2022-05-01 ENCOUNTER — Encounter (HOSPITAL_BASED_OUTPATIENT_CLINIC_OR_DEPARTMENT_OTHER): Payer: Self-pay | Admitting: Family

## 2022-05-01 VITALS — BP 94/62 | HR 77 | Ht 71.0 in | Wt 177.0 lb

## 2022-05-01 DIAGNOSIS — E785 Hyperlipidemia, unspecified: Secondary | ICD-10-CM | POA: Diagnosis not present

## 2022-05-01 DIAGNOSIS — I2510 Atherosclerotic heart disease of native coronary artery without angina pectoris: Secondary | ICD-10-CM

## 2022-05-01 DIAGNOSIS — R55 Syncope and collapse: Secondary | ICD-10-CM

## 2022-05-01 DIAGNOSIS — I5032 Chronic diastolic (congestive) heart failure: Secondary | ICD-10-CM | POA: Diagnosis not present

## 2022-05-01 DIAGNOSIS — R0600 Dyspnea, unspecified: Secondary | ICD-10-CM

## 2022-05-01 NOTE — H&P (View-Only) (Signed)
Office Visit    Patient Name: Savannah [name] Date of Encounter: 05/01/2022  PCP:  Vivi Barrack, [place]  Cardiologist:  Elouise Munroe, MD  Advanced Practice Provider:  No care team member to display Electrophysiologist:  None      Chief Complaint    Savannah [name] is a 66 y.o. female presents today for shortness of breath   Past Medical History    Past Medical History:  Diagnosis Date   ABDOMINAL PAIN, CHRONIC 10/20/2009   Acute cystitis 08/17/2009   ALLERGIC RHINITIS 11/21/2007   Allergy    ASYMPTOMATIC POSTMENOPAUSAL STATUS 09/29/2008   Bariatric surgery status 07/07/2010   Bariatric surgery status 07/07/2010   Qualifier: Diagnosis of  By: Loanne Drilling MD, Sean A    Cancer [facility])    uterine   Cataract    Coronary artery disease    DEPRESSION 11/21/2007   DIABETES MELLITUS, TYPE II 07/08/2007   DYSPNEA 05/20/2009   Edema 05/23/1974   Eosinophilic esophagitis 88/32/5498   FEVER UNSPECIFIED 10/20/2009   FEVER, HX OF 03/09/2010   GERD (gastroesophageal reflux disease)    Headache(784.0) 02/06/2010   Hepatomegaly 01/06/2008   HYPERLIPIDEMIA 07/08/2007   HYPERTENSION 07/08/2007   LEUKOPENIA, MILD 09/29/2008   OBESITY 01/06/2008   OSTEOARTHRITIS 01/06/2008   Other chronic nonalcoholic liver disease 26/41/5830   PERIPHERAL NEUROPATHY 11/21/2007   Peripheral neuropathy    Postsurgical hypothyroidism 09/19/2010   SINUSITIS- ACUTE-NOS 11/21/2007   TB SKIN TEST, POSITIVE 02/06/2010   THYROID NODULE 03/09/2010   Past Surgical History:  Procedure Laterality Date   ABDOMINAL HYSTERECTOMY     BASAL CELL CARCINOMA EXCISION     CARDIAC CATHETERIZATION     CHOLECYSTECTOMY N/A 05/11/2021   Procedure: LAPAROSCOPIC CHOLECYSTECTOMY WITH POSSIBLE  INTRAOPERATIVE CHOLANGIOGRAM;  Surgeon: Johnathan Hausen, MD;  Location: [facility];  Service: General;  Laterality: N/A;   COLONOSCOPY     EYE SURGERY Bilateral 08/2018, 09/2018   FOOT SURGERY Left     10/2019   KNEE ARTHROSCOPY Left    LEFT HEART CATH AND CORONARY ANGIOGRAPHY N/A 01/20/2019   Procedure: LEFT HEART CATH AND CORONARY ANGIOGRAPHY;  Surgeon: Belva Crome, MD;  Location: [place] [facility];  Service: Cardiovascular;  Laterality: N/A;   LYMPH NODE DISSECTION     OOPHORECTOMY     Right total knee replacement     ROUX-EN-Y GASTRIC BYPASS  2011   SKIN TAG REMOVAL     THYROIDECTOMY     TONSILLECTOMY AND ADENOIDECTOMY     UPPER GASTROINTESTINAL ENDOSCOPY      Allergies  Allergies  Allergen Reactions   Ace Inhibitors Cough   Caffeine     PVCs   Ciprofloxacin Itching   Morphine And Related Itching    Pt tolerates medication if given with diphenhydramine   Mucinex [Guaifenesin Er]     PVCs   Nsaids     Gastric Bypass Surgery - unable to take, tolerates ec 81 aspirin    Other     Antihistamine-alkylamine - PVCs tolerates benadryl    Silicone Hives   Tape Hives    History of Present Illness    BRADY [name] is a 66 y.o. female with a hx of syncope, coronary artery disease, hyperlipidemia, chronic diastolic heart failure, DM2, esophageal rings with multiple prior dilations, uterine cancer 1997, s/p bariatric in 2011  last seen 10/09/21 by Caron Presume, PA.  Prior cardiac work-up includes cardiac CT 10/2018 with coronary calcium  score of 1759 placing her in the 99th percentile recommended for cardiac catheterization.  Underwent LHC 01/2019 revealing significant three-vessel coronary disease with most severe lesions involving relatively small vascular territories.  Recommended for aggressive secondary risk prevention and aggressive anti-ischemic therapy.  Anti-ischemic therapy has been limited by hypotension.  Lower extremity ABI 02/2020 with no evidence of PAD.  Echo 04/04/2021 normal LVEF 60 to 65%, no RWMA, normal diastolic parameters, and mild MR.  Last seen 10/10/2019 by Caron Presume, PA.  She noted occasional chest twinges that were not worse than exertion.  She  had recovered from gallbladder surgery and her statin was reinitiated.  Presents today for follow-up with her fianc after contacting the office noting chest pain.  For the last 2 weeks she notes increasing dyspnea at rest as well as with activity and chest pain to left chest that radiates around to her left shoulder.  She does have known lower extremity edema with known lymphedema and has had some improvement since starting Lasix 20 mg daily as recommended by her PCP 04/23/2022.  Some relative hypotension since starting but no significant lightheadedness, dizziness.  Her leg swelling is at her approximate baseline today.  She notes she and her fianc are planning an RV trip to [facility] for the weekend and then going to [place], [place], [place] to visit family.  She is understandably concerned about having work-up prior to leaving for her plane trip.  EKGs/Labs/Other Studies Reviewed:   The following studies were reviewed today:  Echo 03/2021  1. Left ventricular ejection fraction, by estimation, is 60 to 65%. The  left ventricle has normal function. The left ventricle has no regional  wall motion abnormalities. Left ventricular diastolic parameters were  normal. The average left ventricular  global longitudinal strain is -19.9 %. The global longitudinal strain is  normal.   2. Right ventricular systolic function is normal. The right ventricular  size is normal. There is normal pulmonary artery systolic pressure. The  estimated right ventricular systolic pressure is 88.9 mmHg.   3. The mitral valve is grossly normal. Mild mitral valve regurgitation.  No evidence of mitral stenosis.   4. The aortic valve is tricuspid. Aortic valve regurgitation is not  visualized. No aortic stenosis is present.   5. The inferior vena cava is normal in size with greater than 50%  respiratory variability, suggesting right atrial pressure of 3 mmHg.   LHC 01/20/2019 Diffuse LAD and right coronary  calcification. Widely patent left main Eccentric 70% proximal LAD within a calcified segment.  Distal to apical LAD is calcified and contains greater than 90% diffuse obstruction. Circumflex coronary artery is dominant.  The second obtuse marginal which is moderate in size contains 85% stenosis followed by 80% stenosis within a tortuous mid segment.  The circumflex is otherwise widely patent. Right coronary is nondominant and contains proximal to mid heavy calcification with 70 to 80% stenosis. Left ventricular function is normal.  LVEDP is normal.   RECOMMENDATIONS:   The patient has significant three-vessel coronary disease with most severe lesions involving relatively small vascular territories.  The right coronary is essentially nondominant and contains an 80% proximal stenosis that could be treated with orbital atherectomy (downstream territory is small).  The second obtuse marginal contains mid vessel significant stenosis with and tortuosity with only a small territory of potential ischemia beyond the lesions and is best treated with medical therapy.  The distal LAD is heavily calcified and diffusely involved, also best treated with medical  therapy.  The proximal LAD is eccentric and is borderline significant angiographically and could be treated with orbital atherectomy and stenting if symptoms become uncontrollable. Aggressive secondary risk prevention including LDL of 55 or less, consider adding PCSK9 therapy.  Aggressive anti-ischemic therapy EKG:  EKG is ordered today.  The ekg ordered today demonstrates NSR 77 bpm with no acute ST/T wave changes.   Recent Labs: 03/01/2022: ALT 47; BUN 17; Creatinine, Ser 1.02; Hemoglobin 11.7; Platelets 125.0; Potassium 4.3; Sodium 140; TSH 2.81  Recent Lipid Panel    Component Value Date/Time   CHOL 127 03/01/2022 1112   CHOL 136 04/29/2019 1336   TRIG 94.0 03/01/2022 1112   HDL 50.20 03/01/2022 1112   HDL 47 04/29/2019 1336   CHOLHDL 3 03/01/2022  1112   VLDL 18.8 03/01/2022 1112   LDLCALC 58 03/01/2022 1112   LDLCALC 69 04/29/2019 1336   LDLDIRECT 99.0 10/12/2011 1219     Home Medications   Current Meds  Medication Sig   acetaminophen (TYLENOL) 500 MG tablet Take 2 tablets (1,000 mg total) by mouth every 6 (six) hours as needed for mild pain or fever.   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  Correct leg length and improve pronation BL   aspirin EC 81 MG tablet Take 1 tablet (81 mg total) by mouth daily. Starting 01/18/19 prior to cardiac cath   atorvastatin (LIPITOR) 40 MG tablet Take 1 tablet (40 mg total) by mouth daily.   Cholecalciferol (VITAMIN D) 50 MCG (2000 UT) tablet Take 4,000 Units by mouth daily.   CLINPRO 5000 1.1 % PSTE See admin instructions.   colchicine 0.6 MG tablet TAKE 1 TABLET BY MOUTH EVERY DAY   cyclobenzaprine (FLEXERIL) 10 MG tablet Take 10 mg by mouth at bedtime as needed for muscle spasms.   desloratadine (CLARINEX) 5 MG tablet Take 5 mg by mouth daily.   diclofenac sodium (VOLTAREN) 1 % GEL Apply 4 g topically 4 (four) times daily.   Dulaglutide (TRULICITY) 4.5 WF/0.9NA SOPN Inject 4.5 mg as directed once a week.   furosemide (LASIX) 20 MG tablet Take 1 tablet (20 mg total) by mouth daily.   gabapentin (NEURONTIN) 600 MG tablet TAKE 2 TABLETS BY MOUTH 3 TIMES DAILY.   halobetasol (ULTRAVATE) 0.05 % cream Apply 1 application topically 2 (two) times daily as needed (lichen sclerosus).    levothyroxine (SYNTHROID) 112 MCG tablet TAKE 1 TABLET BY MOUTH EVERY DAY   lidocaine (LIDODERM) 5 % PLACE 1 PATCH ONTO THE SKIN DAILY. REMOVE & DISCARD PATCH WITHIN 12 HOURS OR AS DIRECTED BY MD   MAGNESIUM PO Take 4 tablets by mouth daily as needed (cramps).   metFORMIN (GLUCOPHAGE-XR) 500 MG 24 hr tablet Take 4 tablets (2,000 mg total) by mouth daily.   Multiple Vitamin (MULTIVITAMIN) tablet Take 1 tablet by mouth in the morning and at bedtime.   nitroGLYCERIN (NITROSTAT) 0.4 MG  SL tablet Place 1 tablet (0.4 mg total) under the tongue every 5 (five) minutes as needed.   ONETOUCH ULTRA test strip USE TO MONITOR GLUCOSE LEVELS ONCE PER DAY E11.40   OXYCODONE HCL PO Take by mouth as needed.   pantoprazole (PROTONIX) 40 MG tablet TAKE 1 TABLET BY MOUTH TWICE A DAY   repaglinide (PRANDIN) 2 MG tablet Take 1 tablet (2 mg total) by mouth 3 (three) times daily before meals.   TRAMADOL HCL PO Take by mouth as needed.     Review of Systems  All other systems reviewed and are otherwise negative except as noted above.  Physical Exam    VS:  Ht 5' 11"  (1.803 m)   Wt 177 lb (80.3 kg)   BMI 24.69 kg/m  , BMI Body mass index is 24.69 kg/m.  Wt Readings from Last 3 Encounters:  05/01/22 177 lb (80.3 kg)  04/23/22 178 lb (80.7 kg)  03/06/22 179 lb 6.4 oz (81.4 kg)    GEN: Well nourished, well developed, in no acute distress. HEENT: normal. Neck: Supple, no JVD, carotid bruits, or masses. Cardiac: RRR, no murmurs, rubs, or gallops. No clubbing, cyanosis.  RLE with nonpitting edema.  LLE with 2+ pitting edema from foot to mid calf.  Radials/PT 2+ and equal bilaterally.  Respiratory:  Respirations regular and unlabored, clear to auscultation bilaterally. GI: Soft, nontender, nondistended. MS: No deformity or atrophy. Skin: Warm and dry, no rash. Neuro:  Strength and sensation are intact. Psych: Normal affect.  Anxious.  Assessment & Plan    Shortness of breath /CAD -cardiac catheterization 01/2019 with significant three-vessel coronary disease with mid severe lesions involving relatively small vascular territories.  At that time recommended for aggressive secondary prevention, aggressive anti-ischemic therapy, proximal LAD eccentric and borderline significant angiographically could be treated with orbital arthrectomy and stenting of symptoms if symptoms uncontrollable.  Notes 2-week history of worsening dyspnea on exertion, sensation of not getting to a whole breath,  chest discomfort that radiates around her back.  EKG today no acute ST/T wave changes.  She is planning to leave later this week for more than 3 week trip to [place], [place], [place] with her fianc in an RV.  She is hesitant about leaving prior to completing workup. Update CBC to rule out anemia. Plan for [facility] to assess volume status and rule out worsening coronary disease. She is acutely concerned as her breathing is much worse than prior to her last LHC. Discussed possible workup of echocardiogram to start but she prefers to proceed with [facility] due to upcoming travel plans. GDMT for CAD includes aspirin, atorvastatin. No beta blocker due to hypotension.   Shared Decision Making/Informed Consent{ The risks [stroke (1 in 1000), death (1 in 1000), kidney failure [usually temporary] (1 in 500), bleeding (1 in 200), allergic reaction [possibly serious] (1 in 200)], benefits (diagnostic support and management of coronary artery disease) and alternatives of a cardiac catheterization were discussed in detail with Ms. Blomgren and she is willing to proceed.   HLD, LDL goal <55 -presently on atorvastatin 40 mg daily.  03/01/2022 total cholesterol 127, triglycerides 94, HDL 50, LDL 58.  She is near goal of LDL of less than 55.  Could consider PCSK9 I at follow-up.  DM2 - Continue to follow with PCP.   Chronic diastolic heart failure / LE edema -RLE with nonpitting edema, LLE with 2+ pitting edema.  No erythema, evidence of cellulitis.  Known history of cancer treatment with lymph node removal.  Upcoming venous study was ordered by primary care.  She is noting improvement Lasix 20 mg daily as prescribed by her PCP which we will continue.  Could consider spironolactone in the future if she has difficulty controlling Lasix due to relative hypotension. SGLT2i could be considered in the future.  Compression socks, elevation, low-sodium diet encouraged.  Disposition: Follow up  3 weeks after cardiac  catheterization  with Elouise Munroe, MD or APP.  Signed, Loel Dubonnet, NP 05/01/2022, 8:43 AM [place]

## 2022-05-01 NOTE — Progress Notes (Signed)
Office Visit    Patient Name: Savannah [name] Date of Encounter: 05/01/2022  PCP:  Vivi Barrack, [place]  Cardiologist:  Elouise Munroe, MD  Advanced Practice Provider:  No care team member to display Electrophysiologist:  None      Chief Complaint    Savannah [name] is a 66 y.o. female presents today for shortness of breath   Past Medical History    Past Medical History:  Diagnosis Date   ABDOMINAL PAIN, CHRONIC 10/20/2009   Acute cystitis 08/17/2009   ALLERGIC RHINITIS 11/21/2007   Allergy    ASYMPTOMATIC POSTMENOPAUSAL STATUS 09/29/2008   Bariatric surgery status 07/07/2010   Bariatric surgery status 07/07/2010   Qualifier: Diagnosis of  By: Loanne Drilling MD, Sean A    Cancer [facility])    uterine   Cataract    Coronary artery disease    DEPRESSION 11/21/2007   DIABETES MELLITUS, TYPE II 07/08/2007   DYSPNEA 05/20/2009   Edema 84/16/6063   Eosinophilic esophagitis 01/60/1093   FEVER UNSPECIFIED 10/20/2009   FEVER, HX OF 03/09/2010   GERD (gastroesophageal reflux disease)    Headache(784.0) 02/06/2010   Hepatomegaly 01/06/2008   HYPERLIPIDEMIA 07/08/2007   HYPERTENSION 07/08/2007   LEUKOPENIA, MILD 09/29/2008   OBESITY 01/06/2008   OSTEOARTHRITIS 01/06/2008   Other chronic nonalcoholic liver disease 23/55/7322   PERIPHERAL NEUROPATHY 11/21/2007   Peripheral neuropathy    Postsurgical hypothyroidism 09/19/2010   SINUSITIS- ACUTE-NOS 11/21/2007   TB SKIN TEST, POSITIVE 02/06/2010   THYROID NODULE 03/09/2010   Past Surgical History:  Procedure Laterality Date   ABDOMINAL HYSTERECTOMY     BASAL CELL CARCINOMA EXCISION     CARDIAC CATHETERIZATION     CHOLECYSTECTOMY N/A 05/11/2021   Procedure: LAPAROSCOPIC CHOLECYSTECTOMY WITH POSSIBLE  INTRAOPERATIVE CHOLANGIOGRAM;  Surgeon: Johnathan Hausen, MD;  Location: [facility];  Service: General;  Laterality: N/A;   COLONOSCOPY     EYE SURGERY Bilateral 08/2018, 09/2018   FOOT SURGERY Left     10/2019   KNEE ARTHROSCOPY Left    LEFT HEART CATH AND CORONARY ANGIOGRAPHY N/A 01/20/2019   Procedure: LEFT HEART CATH AND CORONARY ANGIOGRAPHY;  Surgeon: Belva Crome, MD;  Location: [place] [facility];  Service: Cardiovascular;  Laterality: N/A;   LYMPH NODE DISSECTION     OOPHORECTOMY     Right total knee replacement     ROUX-EN-Y GASTRIC BYPASS  2011   SKIN TAG REMOVAL     THYROIDECTOMY     TONSILLECTOMY AND ADENOIDECTOMY     UPPER GASTROINTESTINAL ENDOSCOPY      Allergies  Allergies  Allergen Reactions   Ace Inhibitors Cough   Caffeine     PVCs   Ciprofloxacin Itching   Morphine And Related Itching    Pt tolerates medication if given with diphenhydramine   Mucinex [Guaifenesin Er]     PVCs   Nsaids     Gastric Bypass Surgery - unable to take, tolerates ec 81 aspirin    Other     Antihistamine-alkylamine - PVCs tolerates benadryl    Silicone Hives   Tape Hives    History of Present Illness    Savannah [name] is a 66 y.o. female with a hx of syncope, coronary artery disease, hyperlipidemia, chronic diastolic heart failure, DM2, esophageal rings with multiple prior dilations, uterine cancer 1997, s/p bariatric in 2011  last seen 10/09/21 by Caron Presume, PA.  Prior cardiac work-up includes cardiac CT 10/2018 with coronary calcium  score of 1759 placing her in the 99th percentile recommended for cardiac catheterization.  Underwent LHC 01/2019 revealing significant three-vessel coronary disease with most severe lesions involving relatively small vascular territories.  Recommended for aggressive secondary risk prevention and aggressive anti-ischemic therapy.  Anti-ischemic therapy has been limited by hypotension.  Lower extremity ABI 02/2020 with no evidence of PAD.  Echo 04/04/2021 normal LVEF 60 to 65%, no RWMA, normal diastolic parameters, and mild MR.  Last seen 10/10/2019 by Caron Presume, PA.  She noted occasional chest twinges that were not worse than exertion.  She  had recovered from gallbladder surgery and her statin was reinitiated.  Presents today for follow-up with her fianc after contacting the office noting chest pain.  For the last 2 weeks she notes increasing dyspnea at rest as well as with activity and chest pain to left chest that radiates around to her left shoulder.  She does have known lower extremity edema with known lymphedema and has had some improvement since starting Lasix 20 mg daily as recommended by her PCP 04/23/2022.  Some relative hypotension since starting but no significant lightheadedness, dizziness.  Her leg swelling is at her approximate baseline today.  She notes she and her fianc are planning an RV trip to [facility] for the weekend and then going to [place], [place], [place] to visit family.  She is understandably concerned about having work-up prior to leaving for her plane trip.  EKGs/Labs/Other Studies Reviewed:   The following studies were reviewed today:  Echo 03/2021  1. Left ventricular ejection fraction, by estimation, is 60 to 65%. The  left ventricle has normal function. The left ventricle has no regional  wall motion abnormalities. Left ventricular diastolic parameters were  normal. The average left ventricular  global longitudinal strain is -19.9 %. The global longitudinal strain is  normal.   2. Right ventricular systolic function is normal. The right ventricular  size is normal. There is normal pulmonary artery systolic pressure. The  estimated right ventricular systolic pressure is 01.7 mmHg.   3. The mitral valve is grossly normal. Mild mitral valve regurgitation.  No evidence of mitral stenosis.   4. The aortic valve is tricuspid. Aortic valve regurgitation is not  visualized. No aortic stenosis is present.   5. The inferior vena cava is normal in size with greater than 50%  respiratory variability, suggesting right atrial pressure of 3 mmHg.   LHC 01/20/2019 Diffuse LAD and right coronary  calcification. Widely patent left main Eccentric 70% proximal LAD within a calcified segment.  Distal to apical LAD is calcified and contains greater than 90% diffuse obstruction. Circumflex coronary artery is dominant.  The second obtuse marginal which is moderate in size contains 85% stenosis followed by 80% stenosis within a tortuous mid segment.  The circumflex is otherwise widely patent. Right coronary is nondominant and contains proximal to mid heavy calcification with 70 to 80% stenosis. Left ventricular function is normal.  LVEDP is normal.   RECOMMENDATIONS:   The patient has significant three-vessel coronary disease with most severe lesions involving relatively small vascular territories.  The right coronary is essentially nondominant and contains an 80% proximal stenosis that could be treated with orbital atherectomy (downstream territory is small).  The second obtuse marginal contains mid vessel significant stenosis with and tortuosity with only a small territory of potential ischemia beyond the lesions and is best treated with medical therapy.  The distal LAD is heavily calcified and diffusely involved, also best treated with medical  therapy.  The proximal LAD is eccentric and is borderline significant angiographically and could be treated with orbital atherectomy and stenting if symptoms become uncontrollable. Aggressive secondary risk prevention including LDL of 55 or less, consider adding PCSK9 therapy.  Aggressive anti-ischemic therapy EKG:  EKG is ordered today.  The ekg ordered today demonstrates NSR 77 bpm with no acute ST/T wave changes.   Recent Labs: 03/01/2022: ALT 47; BUN 17; Creatinine, Ser 1.02; Hemoglobin 11.7; Platelets 125.0; Potassium 4.3; Sodium 140; TSH 2.81  Recent Lipid Panel    Component Value Date/Time   CHOL 127 03/01/2022 1112   CHOL 136 04/29/2019 1336   TRIG 94.0 03/01/2022 1112   HDL 50.20 03/01/2022 1112   HDL 47 04/29/2019 1336   CHOLHDL 3 03/01/2022  1112   VLDL 18.8 03/01/2022 1112   LDLCALC 58 03/01/2022 1112   LDLCALC 69 04/29/2019 1336   LDLDIRECT 99.0 10/12/2011 1219     Home Medications   Current Meds  Medication Sig   acetaminophen (TYLENOL) 500 MG tablet Take 2 tablets (1,000 mg total) by mouth every 6 (six) hours as needed for mild pain or fever.   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  Correct leg length and improve pronation BL   aspirin EC 81 MG tablet Take 1 tablet (81 mg total) by mouth daily. Starting 01/18/19 prior to cardiac cath   atorvastatin (LIPITOR) 40 MG tablet Take 1 tablet (40 mg total) by mouth daily.   Cholecalciferol (VITAMIN D) 50 MCG (2000 UT) tablet Take 4,000 Units by mouth daily.   CLINPRO 5000 1.1 % PSTE See admin instructions.   colchicine 0.6 MG tablet TAKE 1 TABLET BY MOUTH EVERY DAY   cyclobenzaprine (FLEXERIL) 10 MG tablet Take 10 mg by mouth at bedtime as needed for muscle spasms.   desloratadine (CLARINEX) 5 MG tablet Take 5 mg by mouth daily.   diclofenac sodium (VOLTAREN) 1 % GEL Apply 4 g topically 4 (four) times daily.   Dulaglutide (TRULICITY) 4.5 QQ/5.9DG SOPN Inject 4.5 mg as directed once a week.   furosemide (LASIX) 20 MG tablet Take 1 tablet (20 mg total) by mouth daily.   gabapentin (NEURONTIN) 600 MG tablet TAKE 2 TABLETS BY MOUTH 3 TIMES DAILY.   halobetasol (ULTRAVATE) 0.05 % cream Apply 1 application topically 2 (two) times daily as needed (lichen sclerosus).    levothyroxine (SYNTHROID) 112 MCG tablet TAKE 1 TABLET BY MOUTH EVERY DAY   lidocaine (LIDODERM) 5 % PLACE 1 PATCH ONTO THE SKIN DAILY. REMOVE & DISCARD PATCH WITHIN 12 HOURS OR AS DIRECTED BY MD   MAGNESIUM PO Take 4 tablets by mouth daily as needed (cramps).   metFORMIN (GLUCOPHAGE-XR) 500 MG 24 hr tablet Take 4 tablets (2,000 mg total) by mouth daily.   Multiple Vitamin (MULTIVITAMIN) tablet Take 1 tablet by mouth in the morning and at bedtime.   nitroGLYCERIN (NITROSTAT) 0.4 MG  SL tablet Place 1 tablet (0.4 mg total) under the tongue every 5 (five) minutes as needed.   ONETOUCH ULTRA test strip USE TO MONITOR GLUCOSE LEVELS ONCE PER DAY E11.40   OXYCODONE HCL PO Take by mouth as needed.   pantoprazole (PROTONIX) 40 MG tablet TAKE 1 TABLET BY MOUTH TWICE A DAY   repaglinide (PRANDIN) 2 MG tablet Take 1 tablet (2 mg total) by mouth 3 (three) times daily before meals.   TRAMADOL HCL PO Take by mouth as needed.     Review of Systems  All other systems reviewed and are otherwise negative except as noted above.  Physical Exam    VS:  Ht 5' 11"  (1.803 m)   Wt 177 lb (80.3 kg)   BMI 24.69 kg/m  , BMI Body mass index is 24.69 kg/m.  Wt Readings from Last 3 Encounters:  05/01/22 177 lb (80.3 kg)  04/23/22 178 lb (80.7 kg)  03/06/22 179 lb 6.4 oz (81.4 kg)    GEN: Well nourished, well developed, in no acute distress. HEENT: normal. Neck: Supple, no JVD, carotid bruits, or masses. Cardiac: RRR, no murmurs, rubs, or gallops. No clubbing, cyanosis.  RLE with nonpitting edema.  LLE with 2+ pitting edema from foot to mid calf.  Radials/PT 2+ and equal bilaterally.  Respiratory:  Respirations regular and unlabored, clear to auscultation bilaterally. GI: Soft, nontender, nondistended. MS: No deformity or atrophy. Skin: Warm and dry, no rash. Neuro:  Strength and sensation are intact. Psych: Normal affect.  Anxious.  Assessment & Plan    Shortness of breath /CAD -cardiac catheterization 01/2019 with significant three-vessel coronary disease with mid severe lesions involving relatively small vascular territories.  At that time recommended for aggressive secondary prevention, aggressive anti-ischemic therapy, proximal LAD eccentric and borderline significant angiographically could be treated with orbital arthrectomy and stenting of symptoms if symptoms uncontrollable.  Notes 2-week history of worsening dyspnea on exertion, sensation of not getting to a whole breath,  chest discomfort that radiates around her back.  EKG today no acute ST/T wave changes.  She is planning to leave later this week for more than 3 week trip to [place], [place], [place] with her fianc in an RV.  She is hesitant about leaving prior to completing workup. Update CBC to rule out anemia. Plan for [facility] to assess volume status and rule out worsening coronary disease. She is acutely concerned as her breathing is much worse than prior to her last LHC. Discussed possible workup of echocardiogram to start but she prefers to proceed with [facility] due to upcoming travel plans. GDMT for CAD includes aspirin, atorvastatin. No beta blocker due to hypotension.   Shared Decision Making/Informed Consent{ The risks [stroke (1 in 1000), death (1 in 1000), kidney failure [usually temporary] (1 in 500), bleeding (1 in 200), allergic reaction [possibly serious] (1 in 200)], benefits (diagnostic support and management of coronary artery disease) and alternatives of a cardiac catheterization were discussed in detail with Savannah [name] and she is willing to proceed.   HLD, LDL goal <55 -presently on atorvastatin 40 mg daily.  03/01/2022 total cholesterol 127, triglycerides 94, HDL 50, LDL 58.  She is near goal of LDL of less than 55.  Could consider PCSK9 I at follow-up.  DM2 - Continue to follow with PCP.   Chronic diastolic heart failure / LE edema -RLE with nonpitting edema, LLE with 2+ pitting edema.  No erythema, evidence of cellulitis.  Known history of cancer treatment with lymph node removal.  Upcoming venous study was ordered by primary care.  She is noting improvement Lasix 20 mg daily as prescribed by her PCP which we will continue.  Could consider spironolactone in the future if she has difficulty controlling Lasix due to relative hypotension. SGLT2i could be considered in the future.  Compression socks, elevation, low-sodium diet encouraged.  Disposition: Follow up  3 weeks after cardiac  catheterization  with Elouise Munroe, MD or APP.  Signed, Loel Dubonnet, NP 05/01/2022, 8:43 AM [place]

## 2022-05-01 NOTE — Patient Instructions (Signed)
Medication Instructions:  Your physician recommends that you continue on your current medications as directed. Please refer to the Current Medication list given to you today.   *If you need a refill on your cardiac medications before your next appointment, please call your pharmacy*  Lab Work: CBC/BNP/BMET TODAY   If you have labs (blood work) drawn today and your tests are completely normal, you will receive your results only by: [place] (if you have MyChart) OR A paper copy in the mail If you have any lab test that is abnormal or we need to change your treatment, we will call you to review the results.  Testing/Procedures: Your physician has requested that you have a cardiac catheterization. Cardiac catheterization is used to diagnose and/or treat various heart conditions. Doctors may recommend this procedure for a number of different reasons. The most common reason is to evaluate chest pain. Chest pain can be a symptom of coronary artery disease (CAD), and cardiac catheterization can show whether plaque is narrowing or blocking your heart's arteries. This procedure is also used to evaluate the valves, as well as measure the blood flow and oxygen levels in different parts of your heart. For further information please visit [email or web address]. Please follow instruction sheet, as given.  Follow-Up: At [facility], you and your health needs are our priority.  As part of our continuing mission to provide you with exceptional heart care, we have created designated Provider Care Teams.  These Care Teams include your primary Cardiologist (physician) and Advanced Practice Providers (APPs -  Physician Assistants and Nurse Practitioners) who all work together to provide you with the care you need, when you need it.  We recommend signing up for the patient portal called "MyChart".  Sign up information is provided on this After Visit Summary.  MyChart is used to connect with patients for Virtual  Visits (Telemedicine).  Patients are able to view lab/test results, encounter notes, upcoming appointments, etc.  Non-urgent messages can be sent to your provider as well.   To learn more about what you can do with MyChart, go to [email or web address].    Your next appointment:   WITH DR Annamary Carolin NP FEW WEEKS AFTER CATH OR WHEN YOU RETURN FROM YOUR TRIP    [facility] CARDIOLOGY [place] [id number] Dept: [place]  05/01/2022  You are scheduled for a Cardiac Catheterization on Thursday, June 22 with Dr. Lenna Sciara.  1. Please arrive at the Main Entrance A at [facility]: [place], [place] [address] at 7:00 AM (This time is two hours before your procedure to ensure your preparation). Free valet parking service is available.   Special note: Every effort is made to have your procedure done on time. Please understand that emergencies sometimes delay scheduled procedures.  2. Diet: Do not eat solid foods after midnight.  You may have clear liquids until 5 AM upon the day of the procedure.  3. Labs: You will need to have blood drawn TODAY   4. Medication instructions in preparation for your procedure:   Contrast Allergy: No  HOLD YOUR FUROSEMIDE MORNING OF CATH   Do not take Diabetes Med Glucophage (Metformin) on the day of the procedure and HOLD 48 HOURS AFTER THE PROCEDURE.  On the morning of your procedure, take Aspirin and any morning medicines NOT listed above.  You may use sips of water.  5. Plan to go  home the same day, you will only stay overnight if medically necessary. 6. You MUST have a responsible adult to drive you home. 7. An adult MUST be with you the first 24 hours after you arrive home. 8. Bring a current list of your medications, and the last time and date medication taken. 9. Bring ID and current insurance cards. 10.Please  wear clothes that are easy to get on and off and wear slip-on shoes.  Thank you for allowing [place] to care for you!   -- [facility]

## 2022-05-01 NOTE — Telephone Encounter (Signed)
Patient seen today in clinic by [name], NP

## 2022-05-02 ENCOUNTER — Telehealth: Payer: Self-pay | Admitting: *Deleted

## 2022-05-02 ENCOUNTER — Encounter (HOSPITAL_BASED_OUTPATIENT_CLINIC_OR_DEPARTMENT_OTHER): Payer: Self-pay

## 2022-05-02 LAB — CBC WITH DIFFERENTIAL/PLATELET
Basophils Absolute: 0 10*3/uL (ref 0.0–0.2)
Basos: 1 %
EOS (ABSOLUTE): 0.3 10*3/uL (ref 0.0–0.4)
Eos: 9 %
Hematocrit: 40.5 % (ref 34.0–46.6)
Hemoglobin: 13.6 g/dL (ref 11.1–15.9)
Immature Grans (Abs): 0 10*3/uL (ref 0.0–0.1)
Immature Granulocytes: 0 %
Lymphocytes Absolute: 1.2 10*3/uL (ref 0.7–3.1)
Lymphs: 34 %
MCH: 32.2 pg (ref 26.6–33.0)
MCHC: 33.6 g/dL (ref 31.5–35.7)
MCV: 96 fL (ref 79–97)
Monocytes Absolute: 0.3 10*3/uL (ref 0.1–0.9)
Monocytes: 9 %
Neutrophils Absolute: 1.7 10*3/uL (ref 1.4–7.0)
Neutrophils: 47 %
Platelets: 187 10*3/uL (ref 150–450)
RBC: 4.23 x10E6/uL (ref 3.77–5.28)
RDW: 13 % (ref 11.7–15.4)
WBC: 3.6 10*3/uL (ref 3.4–10.8)

## 2022-05-02 LAB — BASIC METABOLIC PANEL
BUN/Creatinine Ratio: 21 (ref 12–28)
BUN: 22 mg/dL (ref 8–27)
CO2: 25 mmol/L (ref 20–29)
Calcium: 9.5 mg/dL (ref 8.7–10.3)
Chloride: 100 mmol/L (ref 96–106)
Creatinine, Ser: 1.06 mg/dL — ABNORMAL HIGH (ref 0.57–1.00)
Glucose: 188 mg/dL — ABNORMAL HIGH (ref 70–99)
Potassium: 5 mmol/L (ref 3.5–5.2)
Sodium: 141 mmol/L (ref 134–144)
eGFR: 58 mL/min/{1.73_m2} — ABNORMAL LOW (ref >59)

## 2022-05-02 LAB — BRAIN NATRIURETIC PEPTIDE: BNP: 25.1 pg/mL (ref 0.0–100.0)

## 2022-05-02 NOTE — Telephone Encounter (Addendum)
Cardiac Catheterization scheduled at [facility] for: Thursday May 03, 2022 9 AM Arrival time and place: [place] Entrance A at: 7 AM   Nothing to eat after midnight prior to procedure, clear liquids until 5 AM day of procedure.  Medication instructions: -Hold:  Invokana/Prandin-day of procedure  Metformin-day of procedure and 48 hours post procedure  Trulicity-weekly on Tuesdays-confirm.  Lasix-AM of procedure  -Except hold medications usual morning medications can be taken with sips of water including aspirin 81 mg.  Confirmed patient has responsible adult to drive home post procedure and be with patient first 24 hours after arriving home.  Patient reports no new symptoms concerning for COVID-19 in the past 10 days.  Reviewed procedure instructions with patient.

## 2022-05-03 ENCOUNTER — Other Ambulatory Visit: Payer: Self-pay

## 2022-05-03 ENCOUNTER — Encounter (HOSPITAL_COMMUNITY)
Admission: RE | Disposition: A | Discharge status: Home / Self Care | Payer: Self-pay | Source: Home / Self Care | Attending: Internal Medicine

## 2022-05-03 ENCOUNTER — Encounter (HOSPITAL_COMMUNITY): Payer: Self-pay | Admitting: Internal Medicine

## 2022-05-03 ENCOUNTER — Other Ambulatory Visit (HOSPITAL_COMMUNITY): Payer: Self-pay

## 2022-05-03 ENCOUNTER — Ambulatory Visit (HOSPITAL_COMMUNITY)
Admission: RE | Admit: 2022-05-03 | Discharge: 2022-05-03 | Disposition: A | Discharge status: Home / Self Care | Payer: Medicare Other | Attending: Internal Medicine | Admitting: Internal Medicine

## 2022-05-03 DIAGNOSIS — I5032 Chronic diastolic (congestive) heart failure: Secondary | ICD-10-CM | POA: Diagnosis not present

## 2022-05-03 DIAGNOSIS — Z8542 Personal history of malignant neoplasm of other parts of uterus: Secondary | ICD-10-CM | POA: Diagnosis not present

## 2022-05-03 DIAGNOSIS — E1136 Type 2 diabetes mellitus with diabetic cataract: Secondary | ICD-10-CM | POA: Insufficient documentation

## 2022-05-03 DIAGNOSIS — Z7984 Long term (current) use of oral hypoglycemic drugs: Secondary | ICD-10-CM | POA: Insufficient documentation

## 2022-05-03 DIAGNOSIS — E785 Hyperlipidemia, unspecified: Secondary | ICD-10-CM | POA: Insufficient documentation

## 2022-05-03 DIAGNOSIS — I25119 Atherosclerotic heart disease of native coronary artery with unspecified angina pectoris: Secondary | ICD-10-CM | POA: Insufficient documentation

## 2022-05-03 DIAGNOSIS — I2584 Coronary atherosclerosis due to calcified coronary lesion: Secondary | ICD-10-CM | POA: Insufficient documentation

## 2022-05-03 DIAGNOSIS — I2510 Atherosclerotic heart disease of native coronary artery without angina pectoris: Secondary | ICD-10-CM

## 2022-05-03 DIAGNOSIS — Z79899 Other long term (current) drug therapy: Secondary | ICD-10-CM | POA: Insufficient documentation

## 2022-05-03 DIAGNOSIS — Z955 Presence of coronary angioplasty implant and graft: Secondary | ICD-10-CM

## 2022-05-03 DIAGNOSIS — I110 Hypertensive heart disease with heart failure: Secondary | ICD-10-CM | POA: Insufficient documentation

## 2022-05-03 DIAGNOSIS — I209 Angina pectoris, unspecified: Secondary | ICD-10-CM | POA: Diagnosis present

## 2022-05-03 HISTORY — PX: INTRAVASCULAR IMAGING/OCT: CATH118326

## 2022-05-03 HISTORY — PX: INTRAVASCULAR PRESSURE WIRE/FFR STUDY: CATH118243

## 2022-05-03 HISTORY — PX: RIGHT/LEFT HEART CATH AND CORONARY ANGIOGRAPHY: CATH118266

## 2022-05-03 HISTORY — PX: CORONARY STENT INTERVENTION: CATH118234

## 2022-05-03 HISTORY — PX: CORONARY ATHERECTOMY: CATH118238

## 2022-05-03 LAB — POCT ACTIVATED CLOTTING TIME
Activated Clotting Time: 371 seconds
Activated Clotting Time: 227 seconds
Activated Clotting Time: 329 seconds
Activated Clotting Time: 245 seconds

## 2022-05-03 LAB — POCT I-STAT EG7
Acid-Base Excess: 1 mmol/L (ref 0.0–2.0)
Acid-base deficit: 1 mmol/L (ref 0.0–2.0)
Bicarbonate: 26 mmol/L (ref 20.0–28.0)
Bicarbonate: 27 mmol/L (ref 20.0–28.0)
Calcium, Ion: 1.16 mmol/L (ref 1.15–1.40)
Calcium, Ion: 1.18 mmol/L (ref 1.15–1.40)
HCT: 36 % (ref 36.0–46.0)
HCT: 37 % (ref 36.0–46.0)
Hemoglobin: 12.2 g/dL (ref 12.0–15.0)
Hemoglobin: 12.6 g/dL (ref 12.0–15.0)
O2 Saturation: 71 %
O2 Saturation: 75 %
Potassium: 4.4 mmol/L (ref 3.5–5.1)
Potassium: 4.5 mmol/L (ref 3.5–5.1)
Sodium: 140 mmol/L (ref 135–145)
Sodium: 140 mmol/L (ref 135–145)
TCO2: 28 mmol/L (ref 22–32)
TCO2: 28 mmol/L (ref 22–32)
pCO2, Ven: 49.3 mmHg (ref 44–60)
pCO2, Ven: 51.1 mmHg (ref 44–60)
pH, Ven: 7.315 (ref 7.25–7.43)
pH, Ven: 7.346 (ref 7.25–7.43)
pO2, Ven: 40 mmHg (ref 32–45)
pO2, Ven: 44 mmHg (ref 32–45)

## 2022-05-03 LAB — POCT I-STAT 7, (LYTES, BLD GAS, ICA,H+H)
Acid-Base Excess: 0 mmol/L (ref 0.0–2.0)
Bicarbonate: 26.1 mmol/L (ref 20.0–28.0)
Calcium, Ion: 1.17 mmol/L (ref 1.15–1.40)
HCT: 36 % (ref 36.0–46.0)
Hemoglobin: 12.2 g/dL (ref 12.0–15.0)
O2 Saturation: 93 %
Potassium: 4.5 mmol/L (ref 3.5–5.1)
Sodium: 140 mmol/L (ref 135–145)
TCO2: 28 mmol/L (ref 22–32)
pCO2 arterial: 46.3 mmHg (ref 32–48)
pH, Arterial: 7.36 (ref 7.35–7.45)
pO2, Arterial: 70 mmHg — ABNORMAL LOW (ref 83–108)

## 2022-05-03 LAB — GLUCOSE, CAPILLARY: Glucose-Capillary: 165 mg/dL — ABNORMAL HIGH (ref 70–99)

## 2022-05-03 SURGERY — RIGHT/LEFT HEART CATH AND CORONARY ANGIOGRAPHY
Anesthesia: LOCAL

## 2022-05-03 MED ORDER — VERAPAMIL HCL 2.5 MG/ML IV SOLN
INTRAVENOUS | Status: DC | PRN
  Administered 2022-05-03: 10 mL via INTRA_ARTERIAL

## 2022-05-03 MED ORDER — NITROGLYCERIN 1 MG/10 ML FOR IR/CATH LAB
INTRA_ARTERIAL | Status: AC
  Filled 2022-05-03: qty 10

## 2022-05-03 MED ORDER — SODIUM CHLORIDE 0.9% FLUSH: 3.0000 mL | INTRAVENOUS | Status: DC | PRN

## 2022-05-03 MED ORDER — IOHEXOL 350 MG/ML SOLN
INTRAVENOUS | Status: DC | PRN
  Administered 2022-05-03: 100 mL

## 2022-05-03 MED ORDER — SODIUM CHLORIDE 0.9 % IV SOLN: 250.0000 mL | INTRAVENOUS | Status: DC | PRN

## 2022-05-03 MED ORDER — HEPARIN (PORCINE) IN NACL 1000-0.9 UT/500ML-% IV SOLN
INTRAVENOUS | Status: DC | PRN
  Administered 2022-05-03 (×2): 500 mL

## 2022-05-03 MED ORDER — ASPIRIN 81 MG PO CHEW: 81.0000 mg | CHEWABLE_TABLET | Freq: Every day | ORAL | Status: DC

## 2022-05-03 MED ORDER — SODIUM CHLORIDE 0.9 % WEIGHT BASED INFUSION: 1.0000 mL/kg/h | INTRAVENOUS | Status: DC

## 2022-05-03 MED ORDER — MIDAZOLAM HCL 2 MG/2ML IJ SOLN
INTRAMUSCULAR | Status: DC | PRN
  Administered 2022-05-03 (×3): 1 mg via INTRAVENOUS

## 2022-05-03 MED ORDER — CLOPIDOGREL BISULFATE 300 MG PO TABS
ORAL_TABLET | ORAL | Status: AC
  Filled 2022-05-03: qty 2

## 2022-05-03 MED ORDER — CLOPIDOGREL BISULFATE 75 MG PO TABS: 75.0000 mg | ORAL_TABLET | Freq: Every day | ORAL | Status: DC

## 2022-05-03 MED ORDER — FENTANYL CITRATE (PF) 100 MCG/2ML IJ SOLN
INTRAMUSCULAR | Status: AC
  Filled 2022-05-03: qty 2

## 2022-05-03 MED ORDER — HEPARIN (PORCINE) IN NACL 1000-0.9 UT/500ML-% IV SOLN
INTRAVENOUS | Status: AC
  Filled 2022-05-03: qty 1000

## 2022-05-03 MED ORDER — VIPERSLIDE LUBRICANT OPTIME
TOPICAL | Status: DC | PRN
  Administered 2022-05-03: 500 mL via SURGICAL_CAVITY

## 2022-05-03 MED ORDER — LABETALOL HCL 5 MG/ML IV SOLN: 10.0000 mg | INTRAVENOUS | Status: DC | PRN

## 2022-05-03 MED ORDER — HYDRALAZINE HCL 20 MG/ML IJ SOLN: 10.0000 mg | INTRAMUSCULAR | Status: DC | PRN

## 2022-05-03 MED ORDER — LIDOCAINE HCL (PF) 1 % IJ SOLN
INTRAMUSCULAR | Status: AC
  Filled 2022-05-03: qty 30

## 2022-05-03 MED ORDER — LIDOCAINE HCL (PF) 1 % IJ SOLN
INTRAMUSCULAR | Status: DC | PRN
  Administered 2022-05-03 (×2): 2 mL

## 2022-05-03 MED ORDER — MIDAZOLAM HCL 2 MG/2ML IJ SOLN
INTRAMUSCULAR | Status: AC
  Filled 2022-05-03: qty 2

## 2022-05-03 MED ORDER — CLOPIDOGREL BISULFATE 75 MG PO TABS
75.0000 mg | ORAL_TABLET | Freq: Every day | ORAL | 6 refills | Status: DC
  Filled 2022-05-03: qty 30, 30d supply, fill #0

## 2022-05-03 MED ORDER — VERAPAMIL HCL 2.5 MG/ML IV SOLN
INTRAVENOUS | Status: AC
  Filled 2022-05-03: qty 2

## 2022-05-03 MED ORDER — SODIUM CHLORIDE 0.9 % WEIGHT BASED INFUSION
3.0000 mL/kg/h | INTRAVENOUS | Status: AC
  Administered 2022-05-03: 3 mL/kg/h via INTRAVENOUS

## 2022-05-03 MED ORDER — ACETAMINOPHEN 325 MG PO TABS: 650.0000 mg | ORAL_TABLET | ORAL | Status: DC | PRN

## 2022-05-03 MED ORDER — SODIUM CHLORIDE 0.9% FLUSH: 3.0000 mL | Freq: Two times a day (BID) | INTRAVENOUS | Status: DC

## 2022-05-03 MED ORDER — CLOPIDOGREL BISULFATE 75 MG PO TABS: 75.0000 mg | ORAL_TABLET | Freq: Every day | ORAL | 6 refills | Status: DC

## 2022-05-03 MED ORDER — ASPIRIN 81 MG PO TBEC: 81.0000 mg | DELAYED_RELEASE_TABLET | Freq: Every day | ORAL | 0 refills | Status: DC

## 2022-05-03 MED ORDER — FENTANYL CITRATE (PF) 100 MCG/2ML IJ SOLN
INTRAMUSCULAR | Status: DC | PRN
  Administered 2022-05-03 (×3): 25 ug via INTRAVENOUS

## 2022-05-03 MED ORDER — HEPARIN SODIUM (PORCINE) 1000 UNIT/ML IJ SOLN
INTRAMUSCULAR | Status: DC | PRN
  Administered 2022-05-03: 5000 [IU] via INTRAVENOUS
  Administered 2022-05-03: 3000 [IU] via INTRAVENOUS
  Administered 2022-05-03: 4000 [IU] via INTRAVENOUS
  Administered 2022-05-03: 3000 [IU] via INTRAVENOUS

## 2022-05-03 MED ORDER — ONDANSETRON HCL 4 MG/2ML IJ SOLN: 4.0000 mg | Freq: Four times a day (QID) | INTRAMUSCULAR | Status: DC | PRN

## 2022-05-03 MED ORDER — ASPIRIN 81 MG PO CHEW: 81.0000 mg | CHEWABLE_TABLET | ORAL | Status: DC

## 2022-05-03 MED ORDER — SODIUM CHLORIDE 0.9 % IV SOLN: INTRAVENOUS | Status: AC

## 2022-05-03 MED ORDER — CLOPIDOGREL BISULFATE 300 MG PO TABS
ORAL_TABLET | ORAL | Status: DC | PRN
  Administered 2022-05-03: 600 mg via ORAL

## 2022-05-03 MED ORDER — ASPIRIN 81 MG PO TBEC
81.0000 mg | DELAYED_RELEASE_TABLET | Freq: Every day | ORAL | 0 refills | Status: DC
  Filled 2022-05-03: qty 30, 30d supply, fill #0

## 2022-05-03 SURGICAL SUPPLY — 36 items
BALL SAPPHIRE NC24 3.0X15 (BALLOONS) ×2
BALL SAPPHIRE NC24 4.5X15 (BALLOONS) ×2
BALLN SAPPHIRE 3.0X15 (BALLOONS) ×2
BALLOON SAPPHIRE 3.0X15 (BALLOONS) IMPLANT
BALLOON SAPPHIRE NC24 3.0X15 (BALLOONS) IMPLANT
BALLOON SAPPHIRE NC24 4.5X15 (BALLOONS) IMPLANT
BAND CMPR LRG ZPHR (HEMOSTASIS) ×1
BAND ZEPHYR COMPRESS 30 LONG (HEMOSTASIS) ×1 IMPLANT
CATH BALLN WEDGE 5F 110CM (CATHETERS) ×1 IMPLANT
CATH DIAG 6FR JR4 (CATHETERS) ×1 IMPLANT
CATH DIAG 6FR PIGTAIL ANGLED (CATHETERS) ×1 IMPLANT
CATH DRAGONFLY OPSTAR (CATHETERS) ×2 IMPLANT
CATH INFINITI 6F FL3.5 (CATHETERS) ×1 IMPLANT
CATH LAUNCHER 6FR EBU3.5 (CATHETERS) ×1 IMPLANT
CATH TELEPORT (CATHETERS) ×1 IMPLANT
CATH VISTA GUIDE 6FR JR4 (CATHETERS) ×1 IMPLANT
CROWN DIAMONDBACK CLASSIC 1.25 (BURR) ×1 IMPLANT
ELECT DEFIB PAD ADLT CADENCE (PAD) ×1 IMPLANT
GLIDESHEATH SLEND SS 6F .021 (SHEATH) ×1 IMPLANT
GUIDEWIRE INQWIRE 1.5J.035X260 (WIRE) IMPLANT
GUIDEWIRE PRESSURE X 175 (WIRE) ×1 IMPLANT
GUIDEWIRE VAS SION BLUE 190 (WIRE) ×1 IMPLANT
INQWIRE 1.5J .035X260CM (WIRE) ×2
KIT ENCORE 26 ADVANTAGE (KITS) ×1 IMPLANT
KIT ESSENTIALS PG (KITS) ×1 IMPLANT
KIT HEART LEFT (KITS) ×2 IMPLANT
LUBRICANT VIPERSLIDE CORONARY (MISCELLANEOUS) ×1 IMPLANT
PACK CARDIAC CATHETERIZATION (CUSTOM PROCEDURE TRAY) ×2 IMPLANT
SHEATH GLIDE SLENDER 4/5FR (SHEATH) ×1 IMPLANT
SHEATH PROBE COVER 6X72 (BAG) ×1 IMPLANT
STENT SYNERGY XD 3.0X28 (Permanent Stent) IMPLANT
SYNERGY XD 3.0X28 (Permanent Stent) ×2 IMPLANT
TRANSDUCER W/STOPCOCK (MISCELLANEOUS) ×2 IMPLANT
TUBING CIL FLEX 10 FLL-RA (TUBING) ×2 IMPLANT
WIRE ASAHI PROWATER 300CM (WIRE) ×1 IMPLANT
WIRE VIPERWIRE COR FLEX .012 (WIRE) ×1 IMPLANT

## 2022-05-03 NOTE — Discharge Summary (Cosign Needed)
Discharge Summary for Same Day PCI   Patient ID: Savannah [name] MRN: [id number]; DOB: 10-15-1956  Admit date: 05/03/2022 Discharge date: 05/03/2022  Primary Care Provider: Vivi Barrack, MD  Primary Cardiologist: Elouise Munroe, MD  Primary Electrophysiologist:  None   Discharge Diagnoses    Active Problems:   Coronary artery disease involving native coronary artery of native heart without angina pectoris  Diagnostic Studies/Procedures    Cardiac Catheterization 05/03/2022:    Prox LAD lesion is 80% stenosed.   Mid LAD to Dist LAD lesion is 75% stenosed.   Ost Cx to Mid Cx lesion is 15% stenosed.   Prox RCA lesion is 60% stenosed.   Mid RCA lesion is 60% stenosed.   2nd Mrg-1 lesion is 50% stenosed.   2nd Mrg-2 lesion is 50% stenosed.   A stent was successfully placed.   Post intervention, there is a 0% residual stenosis.   LV end diastolic pressure is low.   1.  High-grade proximal LAD lesion treated with OCT guided PCI with atherectomy and 1 drug-eluting stent.  There is diffuse distal disease of the LAD that should be treated medically due to the the small caliber vessel there. 2.  RFR negative moderate proximal to mid RCA disease. 3.  Mild disease of the obtuse marginal. 4.  Cardiac output of 6.9 L/min and cardiac index of 3.4 L/min/m with relatively low filling pressures.   Recommendations: Dual antiplatelet therapy for at least 6 months and goal-directed medical therapy for cardiovascular disease.  Diagnostic Dominance: Left  Intervention   _____________   History of Present Illness     Savannah [name] is a 66 y.o. female with a hx of syncope, coronary artery disease, hyperlipidemia, chronic diastolic heart failure, DM2, esophageal rings with multiple prior dilations, uterine cancer 1997, s/p bariatric in 2011.   Prior cardiac work-up includes cardiac CT 10/2018 with coronary calcium score of 1759 placing her in the 99th percentile recommended for cardiac  catheterization.  Underwent LHC 01/2019 revealing significant three-vessel coronary disease with most severe lesions involving relatively small vascular territories.  Recommended for aggressive secondary risk prevention and aggressive anti-ischemic therapy.  Anti-ischemic therapy has been limited by hypotension.  Lower extremity ABI 02/2020 with no evidence of PAD.  Echo 04/04/2021 normal LVEF 60 to 65%, no RWMA, normal diastolic parameters, and mild MR.   Last seen 10/10/2019 by Caron Presume, PA.  She noted occasional chest twinges that were not worse than exertion.  She had recovered from gallbladder surgery and her statin was reinitiated.   Presented on 6/20 for follow-up with her fianc after contacting the office noting chest pain.  For the last 2 weeks she notes increasing dyspnea at rest as well as with activity and chest pain to left chest that radiates around to her left shoulder.  She does have known lower extremity edema with known lymphedema and has had some improvement since starting Lasix 20 mg daily as recommended by her PCP 04/23/2022.  Some relative hypotension since starting but no significant lightheadedness, dizziness. She noted she and her fianc are planning an RV trip to [facility] for the weekend and then going to [place], [place], [place] to visit family. Given ongoing symptoms it was recommended that she undergo cardiac cath.  Hospital Course     The patient underwent cardiac cath as noted above with pLAD with 80% with mLAD 75% treated with PCI/DES x1 with OCT guidance. Distal diffuse disease of the LAD to be treated medically  given small caliber vessel. Negative RFR of moderate p/mRCA disease. Plan for DAPT with ASA/plavix for at least 6 months. The patient was seen by cardiac rehab while in short stay. There were no observed complications post cath. Radial cath site was re-evaluated prior to discharge and found to be stable without any complications.  Instructions/precautions regarding cath site care were given prior to discharge.  Savannah [name] was seen by Dr. Ali Lowe and determined stable for discharge home. Follow up with our office has been arranged. Medications are listed below. Pertinent changes include addition .  _____________  Cath/PCI Registry Performance & Quality Measures: Aspirin prescribed? - Yes ADP Receptor Inhibitor (Plavix/Clopidogrel, Brilinta/Ticagrelor or Effient/Prasugrel) prescribed (includes medically managed patients)? - Yes High Intensity Statin (Lipitor 40-66m or Crestor 20-471m prescribed? - Yes For EF <40%, was ACEI/ARB prescribed? - Not Applicable (EF >/= 4098%For EF <40%, Aldosterone Antagonist (Spironolactone or Eplerenone) prescribed? - Not Applicable (EF >/= 4092%Cardiac Rehab Phase II ordered (Included Medically managed Patients)? - Yes  _____________   Discharge Vitals Blood pressure (!) 152/93, pulse 70, temperature 97.9 F (36.6 C), temperature source Oral, resp. rate 11, height 5' 11"  (1.803 m), weight 79.4 kg, SpO2 97 %.  Filed Weights   05/03/22 0724  Weight: 79.4 kg    Last Labs & Radiologic Studies    CBC Recent Labs    05/01/22 0943  WBC 3.6  NEUTROABS 1.7  HGB 13.6  HCT 40.5  MCV 96  PLT 18119 Basic Metabolic Panel Recent Labs    05/01/22 0943  NA 141  K 5.0  CL 100  CO2 25  GLUCOSE 188*  BUN 22  CREATININE 1.06*  CALCIUM 9.5   Liver Function Tests No results for input(s): "AST", "ALT", "ALKPHOS", "BILITOT", "PROT", "ALBUMIN" in the last 72 hours. No results for input(s): "LIPASE", "AMYLASE" in the last 72 hours. High Sensitivity Troponin:   No results for input(s): "TROPONINIHS" in the last 720 hours.  BNP Invalid input(s): "POCBNP" D-Dimer No results for input(s): "DDIMER" in the last 72 hours. Hemoglobin A1C No results for input(s): "HGBA1C" in the last 72 hours. Fasting Lipid Panel No results for input(s): "CHOL", "HDL", "LDLCALC", "TRIG", "CHOLHDL",  "LDLDIRECT" in the last 72 hours. Thyroid Function Tests No results for input(s): "TSH", "T4TOTAL", "T3FREE", "THYROIDAB" in the last 72 hours.  Invalid input(s): "FREET3" _____________  CARDIAC CATHETERIZATION  Result Date: 05/03/2022   Prox LAD lesion is 80% stenosed.   Mid LAD to Dist LAD lesion is 75% stenosed.   Ost Cx to Mid Cx lesion is 15% stenosed.   Prox RCA lesion is 60% stenosed.   Mid RCA lesion is 60% stenosed.   2nd Mrg-1 lesion is 50% stenosed.   2nd Mrg-2 lesion is 50% stenosed.   A stent was successfully placed.   Post intervention, there is a 0% residual stenosis.   LV end diastolic pressure is low. 1.  High-grade proximal LAD lesion treated with OCT guided PCI with atherectomy and 1 drug-eluting stent.  There is diffuse distal disease of the LAD that should be treated medically due to the the small caliber vessel there. 2.  RFR negative moderate proximal to mid RCA disease. 3.  Mild disease of the obtuse marginal. 4.  Cardiac output of 6.9 L/min and cardiac index of 3.4 L/min/m with relatively low filling pressures. Recommendations: Dual antiplatelet therapy for at least 6 months and goal-directed medical therapy for cardiovascular disease.    Disposition   Pt is being discharged  home today in good condition.  Follow-up Plans & Appointments     Follow-up Information     Loel Dubonnet, NP Follow up on 05/29/2022.   Specialty: Cardiology Why: at 2:20pm for your follow up appt Contact information: [place] [address] [phone]                Discharge Instructions     Amb Referral to Cardiac Rehabilitation   Complete by: As directed    Diagnosis: Coronary Stents   After initial evaluation and assessments completed: Virtual Based Care may be provided alone or in conjunction with Phase 2 Cardiac Rehab based on patient barriers.: Yes        Discharge Medications   Allergies as of 05/03/2022       Reactions   Ace Inhibitors  Cough   Caffeine    PVCs   Ciprofloxacin Itching   Morphine And Related Itching   Pt tolerates medication if given with diphenhydramine   Mucinex [guaifenesin Er]    PVCs   Nsaids    Gastric Bypass Surgery - unable to take, tolerates ec 81 aspirin    Other    Antihistamine-alkylamine - PVCs tolerates benadryl    Silicone Hives   Tape Hives        Medication List     TAKE these medications    acetaminophen 500 MG tablet Commonly known as: TYLENOL Take 2 tablets (1,000 mg total) by mouth every 6 (six) hours as needed for mild pain or fever.   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  Correct leg length and improve pronation BL   Aspirin Low Dose 81 MG tablet Generic drug: aspirin EC Take 1 tablet (81 mg total) by mouth daily. Swallow whole. What changed: additional instructions   atorvastatin 40 MG tablet Commonly known as: LIPITOR Take 1 tablet (40 mg total) by mouth daily. What changed: when to take this   Clinpro 5000 1.1 % Pste Generic drug: Sodium Fluoride Place 1 Application onto teeth at bedtime.   clopidogrel 75 MG tablet Commonly known as: Plavix Take 1 tablet (75 mg total) by mouth daily.   colchicine 0.6 MG tablet TAKE 1 TABLET BY MOUTH EVERY DAY   cyclobenzaprine 10 MG tablet Commonly known as: FLEXERIL Take 10 mg by mouth at bedtime as needed for muscle spasms.   diclofenac sodium 1 % Gel Commonly known as: VOLTAREN Apply 4 g topically 4 (four) times daily.   fluconazole 200 MG tablet Commonly known as: DIFLUCAN Take 200 mg by mouth once a week.   furosemide 20 MG tablet Commonly known as: Lasix Take 1 tablet (20 mg total) by mouth daily.   gabapentin 600 MG tablet Commonly known as: NEURONTIN TAKE 2 TABLETS BY MOUTH 3 TIMES DAILY.   halobetasol 0.05 % cream Commonly known as: ULTRAVATE Apply 1 application  topically in the morning and at bedtime.   Invokana 300 MG Tabs tablet Generic drug:  canagliflozin Take 300 mg by mouth daily before breakfast.   levothyroxine 112 MCG tablet Commonly known as: SYNTHROID TAKE 1 TABLET BY MOUTH EVERY DAY   lidocaine 5 % Commonly known as: LIDODERM PLACE 1 PATCH ONTO THE SKIN DAILY. REMOVE & DISCARD PATCH WITHIN 12 HOURS OR AS DIRECTED BY MD What changed:  when to take this reasons to take this   loratadine 10 MG tablet Commonly known as: CLARITIN Take 10 mg by mouth in the morning.   MAGNESIUM PO Take 3 tablets  by mouth daily as needed (foot cramps).   metFORMIN 500 MG 24 hr tablet Commonly known as: GLUCOPHAGE-XR Take 4 tablets (2,000 mg total) by mouth daily. What changed:  how much to take when to take this Notes to patient: Restart 05/05/22   multivitamin tablet Take 1 tablet by mouth in the morning and at bedtime.   nitroGLYCERIN 0.4 MG SL tablet Commonly known as: NITROSTAT Place 1 tablet (0.4 mg total) under the tongue every 5 (five) minutes as needed.   OneTouch Ultra test strip Generic drug: glucose blood USE TO MONITOR GLUCOSE LEVELS ONCE PER DAY E11.40   OxyCODONE HCl (Abuse Deter) 5 MG Taba Commonly known as: OXAYDO Take 5 mg by mouth daily as needed (severe pain.).   pantoprazole 40 MG tablet Commonly known as: PROTONIX TAKE 1 TABLET BY MOUTH TWICE A DAY   repaglinide 2 MG tablet Commonly known as: PRANDIN Take 1 tablet (2 mg total) by mouth 3 (three) times daily before meals.   traMADol 50 MG tablet Commonly known as: ULTRAM Take 50 mg by mouth every 6 (six) hours as needed (pain).   Trulicity 4.5 MD/4.7WL Sopn Generic drug: Dulaglutide Inject 4.5 mg as directed once a week. What changed: when to take this   Vitamin D 50 MCG (2000 UT) tablet Take 4,000 Units by mouth in the morning.         Allergies Allergies  Allergen Reactions   Ace Inhibitors Cough   Caffeine     PVCs   Ciprofloxacin Itching   Morphine And Related Itching    Pt tolerates medication if given with  diphenhydramine   Mucinex [Guaifenesin Er]     PVCs   Nsaids     Gastric Bypass Surgery - unable to take, tolerates ec 81 aspirin    Other     Antihistamine-alkylamine - PVCs tolerates benadryl    Silicone Hives   Tape Hives   Outstanding Labs/Studies   N/a   Duration of Discharge Encounter   Greater than 30 minutes including physician time.  Signed, Reino Bellis, NP 05/03/2022, 2:32 PM

## 2022-05-03 NOTE — Interval H&P Note (Signed)
History and Physical Interval Note:  05/03/2022 7:38 AM  Royetta Crochet Kilgore  has presented today for surgery, with the diagnosis of CAD, angina.  The various methods of treatment have been discussed with the patient and family. After consideration of risks, benefits and other options for treatment, the patient has consented to  Procedure(s): RIGHT/LEFT HEART CATH AND CORONARY ANGIOGRAPHY (N/A) as a surgical intervention.  The patient's history has been reviewed, patient examined, no change in status, stable for surgery.  I have reviewed the patient's chart and labs.  Questions were answered to the patient's satisfaction.    Cath Lab Visit (complete for each Cath Lab visit)  Clinical Evaluation Leading to the Procedure:   ACS: No.  Non-ACS:    Anginal Classification: CCS II  Anti-ischemic medical therapy: Minimal Therapy (1 class of medications)  Non-Invasive Test Results: No non-invasive testing performed  Prior CABG: No previous CABG        Early [place]

## 2022-05-03 NOTE — Progress Notes (Signed)
Up and walked to bathroom without difficulty, no c/o chest pain or shortness of breath

## 2022-05-03 NOTE — Discharge Instructions (Signed)
Restart metformin 05/05/22

## 2022-05-03 NOTE — Progress Notes (Signed)
CARDIAC REHAB PHASE I   Stent education completed with pt and family  Diona Fanti plavix education done site care and restrictions  Nutrition education, exercise guidelines completed   [place] CRP 2 referral placed pt denied questions or concerns   [id number] Vanessa Barbara, RN BSN 05/03/2022 1:39 PM

## 2022-05-03 NOTE — Progress Notes (Signed)
Pt up to [facility]-requests board to right FA-applied with coban-waiting for ride/discharge

## 2022-05-03 NOTE — Progress Notes (Signed)
Reino Bellis, NP in to see client and checked right radial site

## 2022-05-03 NOTE — Progress Notes (Signed)
On arrival to short stay, bleeding noted at tr band and per [facility] bleeding from skin tear under tr band; he added 2cc to tr band

## 2022-05-04 ENCOUNTER — Telehealth: Payer: Self-pay | Admitting: Internal Medicine

## 2022-05-04 ENCOUNTER — Telehealth (HOSPITAL_COMMUNITY): Payer: Self-pay

## 2022-05-10 ENCOUNTER — Ambulatory Visit (INDEPENDENT_AMBULATORY_CARE_PROVIDER_SITE_OTHER): Payer: Medicare Other | Admitting: Family

## 2022-05-10 ENCOUNTER — Encounter (HOSPITAL_BASED_OUTPATIENT_CLINIC_OR_DEPARTMENT_OTHER): Payer: Self-pay | Admitting: Family

## 2022-05-10 VITALS — BP 102/68 | HR 73 | Ht 71.0 in | Wt 176.0 lb

## 2022-05-10 DIAGNOSIS — R0602 Shortness of breath: Secondary | ICD-10-CM

## 2022-05-10 DIAGNOSIS — E785 Hyperlipidemia, unspecified: Secondary | ICD-10-CM

## 2022-05-10 DIAGNOSIS — R600 Localized edema: Secondary | ICD-10-CM | POA: Diagnosis not present

## 2022-05-10 DIAGNOSIS — I2510 Atherosclerotic heart disease of native coronary artery without angina pectoris: Secondary | ICD-10-CM | POA: Diagnosis not present

## 2022-05-10 MED ORDER — FUROSEMIDE 20 MG PO TABS: 20.0000 mg | ORAL_TABLET | ORAL | 2 refills | Status: DC | PRN

## 2022-05-10 MED ORDER — LEVALBUTEROL TARTRATE 45 MCG/ACT IN AERO: 1.0000 | INHALATION_SPRAY | RESPIRATORY_TRACT | 12 refills | Status: DC | PRN

## 2022-05-10 MED ORDER — AMLODIPINE BESYLATE 2.5 MG PO TABS: 2.5000 mg | ORAL_TABLET | Freq: Every day | ORAL | 2 refills | Status: DC

## 2022-05-10 MED ORDER — NITROGLYCERIN 0.4 MG SL SUBL: 0.4000 mg | SUBLINGUAL_TABLET | SUBLINGUAL | 3 refills | Status: DC | PRN

## 2022-05-10 NOTE — Patient Instructions (Addendum)
Medication Instructions:  Your physician has recommended you make the following change in your medication:  START Amlodipine 2.2m at bedtime  START Albuterol as needed for shortness of breath  START Lasix as needed for lower extremity edema   *If you need a refill on your cardiac medications before your next appointment, please call your pharmacy*   Lab Work: None ordered today.   Testing/Procedures: Your physician has recommended that you have a pulmonary function test. Pulmonary Function Tests are a group of tests that measure how well air moves in and out of your lungs.    Follow-Up: [facility] our [facility].  As part of our continuing mission to provide you with exceptional heart care, we have created designated Provider Care Teams.  These Care Teams include your primary Cardiologist (physician) and Advanced Practice Providers (APPs -  Physician Assistants and Nurse Practitioners) who all work together to provide you with the care you need, when you need it.  We recommend signing up for the patient portal called "MyChart".  Sign up information is provided on this After Visit Summary.  MyChart is used to connect with patients for Virtual Visits (Telemedicine).  Patients are able to view lab/test results, encounter notes, upcoming appointments, etc.  Non-urgent messages can be sent to your provider as well.   To learn more about what you can do with MyChart, go to [email or web address]    Your next appointment:   In September as scheduled with Dr. AMargaretann Loveless  Other Instructions Heart Healthy Diet Recommendations: A low-salt diet is recommended. Meats should be grilled, baked, or boiled. Avoid fried foods. Focus on lean protein sources like fish or chicken with vegetables and fruits. The American Heart Association is a GMicrobiologist  American Heart Association Diet and Lifeystyle Recommendations   Exercise recommendations: The American Heart  Association recommends 150 minutes of moderate intensity exercise weekly. Try 30 minutes of moderate intensity exercise 4-5 times per week. This could include walking, jogging, or swimming.   Important Information About Sugar

## 2022-05-10 NOTE — Progress Notes (Signed)
Office Visit    Patient Name: Savannah [name] Date of Encounter: 05/10/2022  PCP:  Vivi Barrack, [place]  Cardiologist:  Elouise Munroe, MD  Advanced Practice Provider:  No care team member to display Electrophysiologist:  None      Chief Complaint    Savannah [name] is a 66 y.o. female presents today for follow up after cardiac cath.   Past Medical History    Past Medical History:  Diagnosis Date   ABDOMINAL PAIN, CHRONIC 10/20/2009   Acute cystitis 08/17/2009   ALLERGIC RHINITIS 11/21/2007   Allergy    ASYMPTOMATIC POSTMENOPAUSAL STATUS 09/29/2008   Bariatric surgery status 07/07/2010   Bariatric surgery status 07/07/2010   Qualifier: Diagnosis of  By: Loanne Drilling MD, Sean A    Cancer [facility])    uterine   Cataract    Coronary artery disease    DEPRESSION 11/21/2007   DIABETES MELLITUS, TYPE II 07/08/2007   DYSPNEA 05/20/2009   Edema 89/[birth year]/1941   Eosinophilic esophagitis 74/06/1447   FEVER UNSPECIFIED 10/20/2009   FEVER, HX OF 03/09/2010   GERD (gastroesophageal reflux disease)    Headache(784.0) 02/06/2010   Hepatomegaly 01/06/2008   HYPERLIPIDEMIA 07/08/2007   HYPERTENSION 07/08/2007   LEUKOPENIA, MILD 09/29/2008   OBESITY 01/06/2008   OSTEOARTHRITIS 01/06/2008   Other chronic nonalcoholic liver disease 18/56/3149   PERIPHERAL NEUROPATHY 11/21/2007   Peripheral neuropathy    Postsurgical hypothyroidism 09/19/2010   SINUSITIS- ACUTE-NOS 11/21/2007   TB SKIN TEST, POSITIVE 02/06/2010   THYROID NODULE 03/09/2010   Past Surgical History:  Procedure Laterality Date   ABDOMINAL HYSTERECTOMY     BASAL CELL CARCINOMA EXCISION     CARDIAC CATHETERIZATION     CHOLECYSTECTOMY N/A 05/11/2021   Procedure: LAPAROSCOPIC CHOLECYSTECTOMY WITH POSSIBLE  INTRAOPERATIVE CHOLANGIOGRAM;  Surgeon: Johnathan Hausen, MD;  Location: [facility];  Service: General;  Laterality: N/A;   COLONOSCOPY     CORONARY ATHERECTOMY N/A 05/03/2022   Procedure:  CORONARY ATHERECTOMY;  Surgeon: Early Osmond, MD;  Location: [place] [facility];  Service: Cardiovascular;  Laterality: N/A;   CORONARY STENT INTERVENTION N/A 05/03/2022   Procedure: CORONARY STENT INTERVENTION;  Surgeon: Early Osmond, MD;  Location: [place] [facility];  Service: Cardiovascular;  Laterality: N/A;  lad   EYE SURGERY Bilateral 08/2018, 09/2018   FOOT SURGERY Left    10/2019   INTRAVASCULAR IMAGING/OCT N/A 05/03/2022   Procedure: INTRAVASCULAR IMAGING/OCT;  Surgeon: Early Osmond, MD;  Location: [place] [facility];  Service: Cardiovascular;  Laterality: N/A;   INTRAVASCULAR PRESSURE WIRE/FFR STUDY N/A 05/03/2022   Procedure: INTRAVASCULAR PRESSURE WIRE/FFR STUDY;  Surgeon: Early Osmond, MD;  Location: [place] [facility];  Service: Cardiovascular;  Laterality: N/A;   KNEE ARTHROSCOPY Left    LEFT HEART CATH AND CORONARY ANGIOGRAPHY N/A 01/20/2019   Procedure: LEFT HEART CATH AND CORONARY ANGIOGRAPHY;  Surgeon: Belva Crome, MD;  Location: [place] [facility];  Service: Cardiovascular;  Laterality: N/A;   LYMPH NODE DISSECTION     OOPHORECTOMY     Right total knee replacement     RIGHT/LEFT HEART CATH AND CORONARY ANGIOGRAPHY N/A 05/03/2022   Procedure: RIGHT/LEFT HEART CATH AND CORONARY ANGIOGRAPHY;  Surgeon: Early Osmond, MD;  Location: [employer] [facility];  Service: Cardiovascular;  Laterality: N/A;   ROUX-EN-Y GASTRIC BYPASS  2011   SKIN TAG REMOVAL     THYROIDECTOMY     TONSILLECTOMY AND ADENOIDECTOMY  UPPER GASTROINTESTINAL ENDOSCOPY      Allergies  Allergies  Allergen Reactions   Ace Inhibitors Cough   Caffeine     PVCs   Ciprofloxacin Itching   Morphine And Related Itching    Pt tolerates medication if given with diphenhydramine   Mucinex [Guaifenesin Er]     PVCs   Nsaids     Gastric Bypass Surgery - unable to take, tolerates ec 81 aspirin    Other     Antihistamine-alkylamine - PVCs tolerates benadryl    Silicone Hives   Tape Hives     History of Present Illness    Savannah [name] is a 65 y.o. female with a hx of syncope, coronary artery disease, hyperlipidemia, chronic diastolic heart failure, DM2, esophageal rings with multiple prior dilations, uterine cancer 1997, s/p bariatric in 2011  last seen 05/01/22.   Prior cardiac work-up includes cardiac CT 10/2018 with coronary calcium score of 1759 placing her in the 99th percentile recommended for cardiac catheterization.  Underwent LHC 01/2019 revealing significant three-vessel coronary disease with most severe lesions involving relatively small vascular territories.  Recommended for aggressive secondary risk prevention and aggressive anti-ischemic therapy.  Anti-ischemic therapy has been limited by hypotension.  Lower extremity ABI 02/2020 with no evidence of PAD.  Echo 04/04/2021 normal LVEF 60 to 65%, no RWMA, normal diastolic parameters, and mild MR.  Seen 10/10/2019 by Caron Presume, PA.  She noted occasional chest twinges that were not worse than exertion.  She had recovered from gallbladder surgery and her statin was reinitiated.  At follow up 05/01/22 noted increasing dyspnea at rest and with activity for 2 weeks with some chest pain that radiated to her shoulder. Cardiac cath ordered and performed 05/03/22 with high grade prox LAS treated with OCT guided PCI with atherectomy and DES x1. Recommended to treat diffuse distal disease of LAD medically. RFR negative of moderate prox to mid RCA disease and only mild disease of the obtuse marginal.   Presents today for follow-up with her fianc for follow up after cardiac catheterization. Still with dyspnea both at rest and with activity but this is somewhat improved. No chest pain similar to previous. Sometimes feels she needs to take a deep breath but cannot. She is interested in pursuing additional exercise through PREP program.   EKGs/Labs/Other Studies Reviewed:   The following studies were reviewed today:  Cardiac cath  04/2022     Prox LAD lesion is 80% stenosed.   Mid LAD to Dist LAD lesion is 75% stenosed.   Ost Cx to Mid Cx lesion is 15% stenosed.   Prox RCA lesion is 60% stenosed.   Mid RCA lesion is 60% stenosed.   2nd Mrg-1 lesion is 50% stenosed.   2nd Mrg-2 lesion is 50% stenosed.   A stent was successfully placed.   Post intervention, there is a 0% residual stenosis.   LV end diastolic pressure is low.   1.  High-grade proximal LAD lesion treated with OCT guided PCI with atherectomy and 1 drug-eluting stent.  There is diffuse distal disease of the LAD that should be treated medically due to the the small caliber vessel there. 2.  RFR negative moderate proximal to mid RCA disease. 3.  Mild disease of the obtuse marginal. 4.  Cardiac output of 6.9 L/min and cardiac index of 3.4 L/min/m with relatively low filling pressures.   Recommendations: Dual antiplatelet therapy for at least 6 months and goal-directed medical therapy for cardiovascular disease.    Diagnostic  Dominance: Left  Intervention    Echo 03/2021  1. Left ventricular ejection fraction, by estimation, is 60 to 65%. The  left ventricle has normal function. The left ventricle has no regional  wall motion abnormalities. Left ventricular diastolic parameters were  normal. The average left ventricular  global longitudinal strain is -19.9 %. The global longitudinal strain is  normal.   2. Right ventricular systolic function is normal. The right ventricular  size is normal. There is normal pulmonary artery systolic pressure. The  estimated right ventricular systolic pressure is 82.5 mmHg.   3. The mitral valve is grossly normal. Mild mitral valve regurgitation.  No evidence of mitral stenosis.   4. The aortic valve is tricuspid. Aortic valve regurgitation is not  visualized. No aortic stenosis is present.   5. The inferior vena cava is normal in size with greater than 50%  respiratory variability, suggesting right atrial  pressure of 3 mmHg.   LHC 01/20/2019 Diffuse LAD and right coronary calcification. Widely patent left main Eccentric 70% proximal LAD within a calcified segment.  Distal to apical LAD is calcified and contains greater than 90% diffuse obstruction. Circumflex coronary artery is dominant.  The second obtuse marginal which is moderate in size contains 85% stenosis followed by 80% stenosis within a tortuous mid segment.  The circumflex is otherwise widely patent. Right coronary is nondominant and contains proximal to mid heavy calcification with 70 to 80% stenosis. Left ventricular function is normal.  LVEDP is normal.   RECOMMENDATIONS:   The patient has significant three-vessel coronary disease with most severe lesions involving relatively small vascular territories.  The right coronary is essentially nondominant and contains an 80% proximal stenosis that could be treated with orbital atherectomy (downstream territory is small).  The second obtuse marginal contains mid vessel significant stenosis with and tortuosity with only a small territory of potential ischemia beyond the lesions and is best treated with medical therapy.  The distal LAD is heavily calcified and diffusely involved, also best treated with medical therapy.  The proximal LAD is eccentric and is borderline significant angiographically and could be treated with orbital atherectomy and stenting if symptoms become uncontrollable. Aggressive secondary risk prevention including LDL of 55 or less, consider adding PCSK9 therapy.  Aggressive anti-ischemic therapy  EKG:  EKG is ordered today.  The ekg ordered today demonstrates NSR 73 bpm with no acute ST/T wave changes.   Recent Labs: 03/01/2022: ALT 47; TSH 2.81 05/01/2022: BNP 25.1; BUN 22; Creatinine, Ser 1.06; Platelets 187 05/03/2022: Hemoglobin 12.2; Potassium 4.4; Sodium 140  Recent Lipid Panel    Component Value Date/Time   CHOL 127 03/01/2022 1112   CHOL 136 04/29/2019 1336    TRIG 94.0 03/01/2022 1112   HDL 50.20 03/01/2022 1112   HDL 47 04/29/2019 1336   CHOLHDL 3 03/01/2022 1112   VLDL 18.8 03/01/2022 1112   LDLCALC 58 03/01/2022 1112   LDLCALC 69 04/29/2019 1336   LDLDIRECT 99.0 10/12/2011 1219     Home Medications   Current Meds  Medication Sig   acetaminophen (TYLENOL) 500 MG tablet Take 2 tablets (1,000 mg total) by mouth every 6 (six) hours as needed for mild pain or fever.   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  Correct leg length and improve pronation BL   aspirin EC 81 MG tablet Take 1 tablet (81 mg total) by mouth daily. Swallow whole.   atorvastatin (LIPITOR) 40 MG tablet Take 1 tablet (40 mg total) by mouth  daily. (Patient taking differently: Take 40 mg by mouth every evening.)   canagliflozin (INVOKANA) 300 MG TABS tablet Take 300 mg by mouth daily before breakfast.   Cholecalciferol (VITAMIN D) 50 MCG (2000 UT) tablet Take 4,000 Units by mouth in the morning.   CLINPRO 5000 1.1 % PSTE Place 1 Application onto teeth at bedtime.   clopidogrel (PLAVIX) 75 MG tablet Take 1 tablet (75 mg total) by mouth daily.   colchicine 0.6 MG tablet TAKE 1 TABLET BY MOUTH EVERY DAY   cyclobenzaprine (FLEXERIL) 10 MG tablet Take 10 mg by mouth at bedtime as needed for muscle spasms.   diclofenac sodium (VOLTAREN) 1 % GEL Apply 4 g topically 4 (four) times daily.   Dulaglutide (TRULICITY) 4.5 IR/6.7EL SOPN Inject 4.5 mg as directed once a week. (Patient taking differently: Inject 4.5 mg as directed every Tuesday at 6 PM.)   fluconazole (DIFLUCAN) 200 MG tablet Take 200 mg by mouth once a week.   furosemide (LASIX) 20 MG tablet Take 1 tablet (20 mg total) by mouth daily.   gabapentin (NEURONTIN) 600 MG tablet TAKE 2 TABLETS BY MOUTH 3 TIMES DAILY.   Halobetasol & Lactic Acid (ULTRAVATE X, OINTMENT, EX) Apply 1 Application topically 2 (two) times daily.   levothyroxine (SYNTHROID) 112 MCG tablet TAKE 1 TABLET BY MOUTH EVERY  DAY   lidocaine (LIDODERM) 5 % PLACE 1 PATCH ONTO THE SKIN DAILY. REMOVE & DISCARD PATCH WITHIN 12 HOURS OR AS DIRECTED BY MD (Patient taking differently: Place 1 patch onto the skin daily as needed (pain.). Remove & Discard patch within 12 hours or as directed by MD)   loratadine (CLARITIN) 10 MG tablet Take 10 mg by mouth in the morning.   MAGNESIUM PO Take 3 tablets by mouth daily as needed (foot cramps).   metFORMIN (GLUCOPHAGE-XR) 500 MG 24 hr tablet Take 4 tablets (2,000 mg total) by mouth daily. (Patient taking differently: Take 1,000 mg by mouth in the morning and at bedtime.)   Multiple Vitamin (MULTIVITAMIN) tablet Take 1 tablet by mouth in the morning and at bedtime.   nitroGLYCERIN (NITROSTAT) 0.4 MG SL tablet Place 1 tablet (0.4 mg total) under the tongue every 5 (five) minutes as needed.   ONETOUCH ULTRA test strip USE TO MONITOR GLUCOSE LEVELS ONCE PER DAY E11.40   OxyCODONE HCl, Abuse Deter, (OXAYDO) 5 MG TABA Take 5 mg by mouth daily as needed (severe pain.).   pantoprazole (PROTONIX) 40 MG tablet Take 40 mg by mouth 2 (two) times daily.   repaglinide (PRANDIN) 2 MG tablet Take 1 tablet (2 mg total) by mouth 3 (three) times daily before meals.   traMADol (ULTRAM) 50 MG tablet Take 50 mg by mouth every 6 (six) hours as needed (pain).     Review of Systems      All other systems reviewed and are otherwise negative except as noted above.  Physical Exam    VS:  BP 102/68   Pulse 73   Ht 5' 11"  (1.803 m)   Wt 176 lb (79.8 kg)   BMI 24.55 kg/m  , BMI Body mass index is 24.55 kg/m.  Wt Readings from Last 3 Encounters:  05/10/22 176 lb (79.8 kg)  05/03/22 175 lb (79.4 kg)  05/01/22 177 lb (80.3 kg)    GEN: Well nourished, well developed, in no acute distress. HEENT: normal. Neck: Supple, no JVD, carotid bruits, or masses. Cardiac: RRR, no murmurs, rubs, or gallops. No clubbing, cyanosis.  RLE with nonpitting edema.  LLE with 2+ pitting edema from foot to mid calf.   Radials/PT 2+ and equal bilaterally.  Respiratory:  Respirations regular and unlabored, clear to auscultation bilaterally. GI: Soft, nontender, nondistended. MS: No deformity or atrophy. Skin: Warm and dry, no rash. Neuro:  Strength and sensation are intact. Psych: Normal affect.  Anxious.  Assessment & Plan    Shortness of breath /CAD -Recent DES to prox LAD. EKG today no acute ST/T wave changes. Still with dyspnea. Add Amlodipine 2.75m QHS for anti anginal benefit. Previous hypotension with Metoprolol. Low suspicion her BP would tolerate Imdur. Her shortness of breath may not all be due to her coronary disease - will Rx PRN levalbuterol and order PFT's. If abnormal, refer to pulmonology. [facility]   HLD, LDL goal <55 -presently on atorvastatin 40 mg daily.  03/01/2022 total cholesterol 127, triglycerides 94, HDL 50, LDL 58.  She is near goal of LDL of less than 55.  Could consider PCSK9 I at follow-up.  DM2 - Continue to follow with PCP.   Chronic diastolic heart failure / LE edema - Intolerant to beta blocker previously due to hypotension. LLE with edema, known lymphedema post cancer treatments. Will Rx PRN Lasix.   Disposition: Follow up in September with GElouise Munroe MD   Signed, CLoel Dubonnet NP 05/10/2022, 1:43 PM [employer]

## 2022-05-12 ENCOUNTER — Encounter (HOSPITAL_BASED_OUTPATIENT_CLINIC_OR_DEPARTMENT_OTHER): Payer: Self-pay | Admitting: Family

## 2022-05-16 ENCOUNTER — Telehealth: Payer: Self-pay

## 2022-05-16 NOTE — Telephone Encounter (Signed)
Called to discuss PREP program referral, she wants to attend, is going out of town in July so will start August 8 class, every T/Th 08-1114; will call back toward end of July to set up assessment visit.

## 2022-05-16 NOTE — Progress Notes (Deleted)
Office Visit Note  Patient: Savannah [name]             Date of Birth: [birth year]           MRN: [id number]             PCP: Vivi Barrack, MD Referring: Vivi Barrack, MD Visit Date: 05/29/2022 Occupation: @GUAROCC @  Subjective:  No chief complaint on file.   History of Present Illness: ZYAIR RHEIN is a 66 y.o. female ***   Activities of Daily Living:  Patient reports morning stiffness for *** {minute/hour:19697}.   Patient {ACTIONS;DENIES/REPORTS:21021675::"Denies"} nocturnal pain.  Difficulty dressing/grooming: {ACTIONS;DENIES/REPORTS:21021675::"Denies"} Difficulty climbing stairs: {ACTIONS;DENIES/REPORTS:21021675::"Denies"} Difficulty getting out of chair: {ACTIONS;DENIES/REPORTS:21021675::"Denies"} Difficulty using hands for taps, buttons, cutlery, and/or writing: {ACTIONS;DENIES/REPORTS:21021675::"Denies"}  No Rheumatology ROS completed.   PMFS History:  Patient Active Problem List   Diagnosis Date Noted   Bilateral carpal tunnel syndrome 03/13/2022   Leg edema 03/01/2022   Nondisplaced comminuted fracture of left patella, initial encounter for closed fracture 08/01/2021   Arthritis of carpometacarpal [facility]) joint of right thumb 07/28/2021   Primary osteoarthritis of right distal radioulnar joint 07/28/2021   Arthritis of wrist, right, degenerative 07/28/2021   Osteopenia 01/14/2021   Sesamoiditis of left foot 11/09/2020   Posterior tibial tendinitis of left lower extremity 11/09/2020   Type 2 diabetes mellitus with diabetic peripheral angiopathy without gangrene, with long-term current use of insulin ([place]) 10/21/2020   Iron deficiency anemia 03/14/2020   Lesion of vulva 09/02/2019   Menorrhagia 09/02/2019   Neuropathy 09/02/2019   Irregular bowel habits 06/25/2019   Coronary artery disease involving native coronary artery of native heart without angina pectoris    Temporomandibular joint disorder 12/05/2018   Fatigue 12/05/2018   Inverted nipple 12/05/2018    Senile purpura ([place]) 11/21/2018   GERD with esophagitis 11/21/2018   Pseudogout involving multiple joints 11/21/2018   Lipoma 81/15/7262   Lichen sclerosus 03/55/9741   H/O gastric bypass 11/16/2016   GERD (gastroesophageal reflux disease) 11/16/2016   Depression 11/16/2016   Diabetic polyneuropathy associated with type 2 diabetes mellitus ([place]) 11/16/2016   Uterine cancer ([place]) s/p hysterectomy 1997 01/04/2012   Postsurgical hypothyroidism 09/19/2010   Diabetic neuropathy ([place]) 11/21/2007   Allergic rhinitis 11/21/2007   Dyslipidemia associated with type 2 diabetes mellitus ([place]) 07/08/2007    Past Medical History:  Diagnosis Date   ABDOMINAL PAIN, CHRONIC 10/20/2009   Acute cystitis 08/17/2009   ALLERGIC RHINITIS 11/21/2007   Allergy    ASYMPTOMATIC POSTMENOPAUSAL STATUS 09/29/2008   Bariatric surgery status 07/07/2010   Bariatric surgery status 07/07/2010   Qualifier: Diagnosis of  By: Loanne Drilling MD, Sean A    Cancer [facility])    uterine   Cataract    Coronary artery disease    DEPRESSION 11/21/2007   DIABETES MELLITUS, TYPE II 07/08/2007   DYSPNEA 05/20/2009   Edema 63/84/5364   Eosinophilic esophagitis 68/01/2121   FEVER UNSPECIFIED 10/20/2009   FEVER, HX OF 03/09/2010   GERD (gastroesophageal reflux disease)    Headache(784.0) 02/06/2010   Hepatomegaly 01/06/2008   HYPERLIPIDEMIA 07/08/2007   HYPERTENSION 07/08/2007   LEUKOPENIA, MILD 09/29/2008   OBESITY 01/06/2008   OSTEOARTHRITIS 01/06/2008   Other chronic nonalcoholic liver disease 48/25/0037   PERIPHERAL NEUROPATHY 11/21/2007   Peripheral neuropathy    Postsurgical hypothyroidism 09/19/2010   SINUSITIS- ACUTE-NOS 11/21/2007   TB SKIN TEST, POSITIVE 02/06/2010   THYROID NODULE 03/09/2010    Family History  Problem Relation Age of Onset   Diabetes Mother  Neuropathy Mother    Diabetes Father    Congestive Heart Failure Father    Hypertension Father    Myelodysplastic syndrome Father    Bladder Cancer  Sister    Multiple sclerosis Sister    Breast cancer Sister        breast onset age 66   Osteoporosis Sister    Liver cancer Maternal Aunt    Breast cancer Maternal Aunt    Diabetes Maternal Aunt    Colon cancer Maternal Uncle    Diabetes Maternal Uncle    Neuropathy Paternal Aunt    Diabetes Maternal Grandmother    Diabetes Paternal Grandmother    Past Surgical History:  Procedure Laterality Date   ABDOMINAL HYSTERECTOMY     BASAL CELL CARCINOMA EXCISION     CARDIAC CATHETERIZATION     CHOLECYSTECTOMY N/A 05/11/2021   Procedure: LAPAROSCOPIC CHOLECYSTECTOMY WITH POSSIBLE  INTRAOPERATIVE CHOLANGIOGRAM;  Surgeon: Johnathan Hausen, MD;  Location: [facility];  Service: General;  Laterality: N/A;   COLONOSCOPY     CORONARY ATHERECTOMY N/A 05/03/2022   Procedure: CORONARY ATHERECTOMY;  Surgeon: Early Osmond, MD;  Location: [place] [facility];  Service: Cardiovascular;  Laterality: N/A;   CORONARY STENT INTERVENTION N/A 05/03/2022   Procedure: CORONARY STENT INTERVENTION;  Surgeon: Early Osmond, MD;  Location: [place] [facility];  Service: Cardiovascular;  Laterality: N/A;  lad   EYE SURGERY Bilateral 08/2018, 09/2018   FOOT SURGERY Left    10/2019   INTRAVASCULAR IMAGING/OCT N/A 05/03/2022   Procedure: INTRAVASCULAR IMAGING/OCT;  Surgeon: Early Osmond, MD;  Location: [place] [facility];  Service: Cardiovascular;  Laterality: N/A;   INTRAVASCULAR PRESSURE WIRE/FFR STUDY N/A 05/03/2022   Procedure: INTRAVASCULAR PRESSURE WIRE/FFR STUDY;  Surgeon: Early Osmond, MD;  Location: [place] [facility];  Service: Cardiovascular;  Laterality: N/A;   KNEE ARTHROSCOPY Left    LEFT HEART CATH AND CORONARY ANGIOGRAPHY N/A 01/20/2019   Procedure: LEFT HEART CATH AND CORONARY ANGIOGRAPHY;  Surgeon: Belva Crome, MD;  Location: [place] [facility];  Service: Cardiovascular;  Laterality: N/A;   LYMPH NODE DISSECTION     OOPHORECTOMY     Right total knee replacement     RIGHT/LEFT HEART CATH AND  CORONARY ANGIOGRAPHY N/A 05/03/2022   Procedure: RIGHT/LEFT HEART CATH AND CORONARY ANGIOGRAPHY;  Surgeon: Early Osmond, MD;  Location: [place] [facility];  Service: Cardiovascular;  Laterality: N/A;   ROUX-EN-Y GASTRIC BYPASS  2011   SKIN TAG REMOVAL     THYROIDECTOMY     TONSILLECTOMY AND ADENOIDECTOMY     UPPER GASTROINTESTINAL ENDOSCOPY     Social History   Social History Narrative   Not on file   Immunization History  Administered Date(s) Administered   Fluad Quad(high Dose 65+) 07/13/2019   Influenza Inj Mdck Quad Pf 10/19/2017, 07/23/2018   Influenza Whole 07/30/2008, 08/17/2009, 09/19/2010   Influenza, High Dose Seasonal PF 08/15/2021   Influenza,inj,Quad PF,6-35 Mos 07/14/2015   Influenza-Unspecified 10/13/2011, 08/12/2013, 07/13/2017, 07/23/2018   PFIZER(Purple Top)SARS-COV-2 Vaccination 12/21/2019, 01/14/2020, 08/18/2020   Pfizer Covid-19 Vaccine Bivalent Booster 56yr & up 08/15/2021   Pneumococcal Conjugate-13 10/14/2015   Pneumococcal Polysaccharide-23 06/25/2014, 10/21/2020   Tdap 05/20/2020   Zoster Recombinat (Shingrix) 05/20/2020   Zoster, Live 06/25/2014     Objective: Vital Signs: There were no vitals taken for this visit.   Physical Exam   Musculoskeletal Exam: ***  CDAI Exam: CDAI Score: -- Patient Global: --; Provider Global: -- Swollen: --; Tender: --  Joint Exam 05/29/2022   No joint exam has been documented for this visit   There is currently no information documented on the homunculus. Go to the Rheumatology activity and complete the homunculus joint exam.  Investigation: No additional findings.  Imaging: CARDIAC CATHETERIZATION  Result Date: 05/03/2022   Prox LAD lesion is 80% stenosed.   Mid LAD to Dist LAD lesion is 75% stenosed.   Ost Cx to Mid Cx lesion is 15% stenosed.   Prox RCA lesion is 60% stenosed.   Mid RCA lesion is 60% stenosed.   2nd Mrg-1 lesion is 50% stenosed.   2nd Mrg-2 lesion is 50% stenosed.   A stent was  successfully placed.   Post intervention, there is a 0% residual stenosis.   LV end diastolic pressure is low. 1.  High-grade proximal LAD lesion treated with OCT guided PCI with atherectomy and 1 drug-eluting stent.  There is diffuse distal disease of the LAD that should be treated medically due to the the small caliber vessel there. 2.  RFR negative moderate proximal to mid RCA disease. 3.  Mild disease of the obtuse marginal. 4.  Cardiac output of 6.9 L/min and cardiac index of 3.4 L/min/m with relatively low filling pressures. Recommendations: Dual antiplatelet therapy for at least 6 months and goal-directed medical therapy for cardiovascular disease.    Recent Labs: Lab Results  Component Value Date   WBC 3.6 05/01/2022   HGB 12.2 05/03/2022   PLT 187 05/01/2022   NA 140 05/03/2022   K 4.4 05/03/2022   CL 100 05/01/2022   CO2 25 05/01/2022   GLUCOSE 188 (H) 05/01/2022   BUN 22 05/01/2022   CREATININE 1.06 (H) 05/01/2022   BILITOT 0.4 03/01/2022   ALKPHOS 111 03/01/2022   AST 31 03/01/2022   ALT 47 (H) 03/01/2022   PROT 5.4 (L) 03/01/2022   ALBUMIN 3.7 03/01/2022   CALCIUM 9.5 05/01/2022   GFRAA 73 08/16/2020    Speciality Comments: No specialty comments available.  Procedures:  No procedures performed Allergies: Ace inhibitors, Caffeine, Ciprofloxacin, Morphine and related, Mucinex [guaifenesin er], Nsaids, Other, Silicone, and Tape   Assessment / Plan:     Visit Diagnoses: No diagnosis found.  Orders: No orders of the defined types were placed in this encounter.  No orders of the defined types were placed in this encounter.   Face-to-face time spent with patient was *** minutes. Greater than 50% of time was spent in counseling and coordination of care.  Follow-Up Instructions: No follow-ups on file.   Earnestine Mealing, CMA  Note - This record has been created using Editor, commissioning.  Chart creation errors have been sought, but may not always  have been located.  Such creation errors do not reflect on  the standard of medical care.

## 2022-05-16 NOTE — Telephone Encounter (Signed)
Patient seen on 6/29 by [name], NP.

## 2022-05-21 ENCOUNTER — Ambulatory Visit: Payer: Medicare Other | Admitting: Physician Assistant

## 2022-05-22 ENCOUNTER — Encounter (HOSPITAL_BASED_OUTPATIENT_CLINIC_OR_DEPARTMENT_OTHER): Payer: Self-pay | Admitting: Family

## 2022-05-24 NOTE — Progress Notes (Unsigned)
Office Visit Note  Patient: Savannah [name]             Date of Birth: 15-May-1956           MRN: [id number]             PCP: Vivi Barrack, MD Referring: Vivi Barrack, MD Visit Date: 06/07/2022 Occupation: @GUAROCC @  Subjective:  Pain in multiple joints   History of Present Illness: Savannah [name] is a 66 y.o. female with history of inflammatory arthritis, DDD, osteoarthritis, and osteoporosis. She is taking colchicine 0.6 mg 1 tablet by mouth daily.  She has been tolerating colchicine without any side effects.  She continues to find colchicine to be effective at managing her joint inflammation and experiences flares if she discontinues.  She continues to have chronic pain involving multiple joints.  She is having significant discomfort in her right hand and wrist currently.  She had a right wrist and CMC joint injection performed by Dr. Tempie Donning on 5-23 which provided temporary relief.  She is still hesitant to proceed with surgery at this time.  She has chronic pain in the left foot and ankle due to an unsuccessful surgery in the past.  She also has chronic pain in her lower back.  She will be establishing care with a pain management specialist on Monday but is apprehensive to take a daily narcotic.  She has been taking tramadol sparingly for severe pain relief.   Activities of Daily Living:  Patient reports morning stiffness for all day.   Patient Denies nocturnal pain.  Difficulty dressing/grooming: Reports Difficulty climbing stairs: Reports Difficulty getting out of chair: Reports Difficulty using hands for taps, buttons, cutlery, and/or writing: Reports  Review of Systems  Constitutional:  Positive for fatigue.  HENT:  Positive for mouth dryness. Negative for mouth sores and nose dryness.   Eyes:  Positive for dryness. Negative for pain and visual disturbance.  Respiratory:  Negative for cough, hemoptysis and difficulty breathing.   Cardiovascular:  Positive for palpitations.  Negative for hypertension and swelling in legs/feet.  Gastrointestinal:  Positive for constipation and diarrhea. Negative for blood in stool.  Endocrine: Negative for increased urination.  Genitourinary:  Negative for painful urination and involuntary urination.  Musculoskeletal:  Positive for joint pain, joint pain, joint swelling and morning stiffness. Negative for myalgias, muscle weakness, muscle tenderness and myalgias.  Skin:  Positive for color change and sensitivity to sunlight. Negative for pallor, rash, hair loss, nodules/bumps, skin tightness and ulcers.  Allergic/Immunologic: Negative for susceptible to infections.  Neurological:  Negative for dizziness, numbness, headaches and weakness.  Hematological:  Positive for bruising/bleeding tendency. Negative for swollen glands.  Psychiatric/Behavioral:  Negative for depressed mood and sleep disturbance. The patient is not nervous/anxious.     PMFS History:  Patient Active Problem List   Diagnosis Date Noted   Bilateral carpal tunnel syndrome 03/13/2022   Leg edema 03/01/2022   Nondisplaced comminuted fracture of left patella, initial encounter for closed fracture 08/01/2021   Arthritis of carpometacarpal [facility]) joint of right thumb 07/28/2021   Primary osteoarthritis of right distal radioulnar joint 07/28/2021   Arthritis of wrist, right, degenerative 07/28/2021   Osteopenia 01/14/2021   Sesamoiditis of left foot 11/09/2020   Posterior tibial tendinitis of left lower extremity 11/09/2020   Type 2 diabetes mellitus with diabetic peripheral angiopathy without gangrene, with long-term current use of insulin ([place]) 10/21/2020   Iron deficiency anemia 03/14/2020   Lesion of vulva 09/02/2019  Menorrhagia 09/02/2019   Neuropathy 09/02/2019   Irregular bowel habits 06/25/2019   Coronary artery disease involving native coronary artery of native heart without angina pectoris    Temporomandibular joint disorder 12/05/2018   Fatigue  12/05/2018   Inverted nipple 12/05/2018   Senile purpura ([place]) 11/21/2018   GERD with esophagitis 11/21/2018   Pseudogout involving multiple joints 11/21/2018   Lipoma 02/77/4128   Lichen sclerosus 78/67/6720   H/O gastric bypass 11/16/2016   GERD (gastroesophageal reflux disease) 11/16/2016   Depression 11/16/2016   Diabetic polyneuropathy associated with type 2 diabetes mellitus ([place]) 11/16/2016   Uterine cancer ([place]) s/p hysterectomy 1997 01/04/2012   Postsurgical hypothyroidism 09/19/2010   Diabetic neuropathy ([place]) 11/21/2007   Allergic rhinitis 11/21/2007   Dyslipidemia associated with type 2 diabetes mellitus ([place]) 07/08/2007    Past Medical History:  Diagnosis Date   ABDOMINAL PAIN, CHRONIC 10/20/2009   Acute cystitis 08/17/2009   ALLERGIC RHINITIS 11/21/2007   Allergy    ASYMPTOMATIC POSTMENOPAUSAL STATUS 09/29/2008   Bariatric surgery status 07/07/2010   Bariatric surgery status 07/07/2010   Qualifier: Diagnosis of  By: Loanne Drilling MD, Sean A    Cancer [facility])    uterine   Cataract    Coronary artery disease    DEPRESSION 11/21/2007   DIABETES MELLITUS, TYPE II 07/08/2007   DYSPNEA 05/20/2009   Edema 94/70/9628   Eosinophilic esophagitis 36/62/9476   FEVER UNSPECIFIED 10/20/2009   FEVER, HX OF 03/09/2010   GERD (gastroesophageal reflux disease)    Headache(784.0) 02/06/2010   Hepatomegaly 01/06/2008   HYPERLIPIDEMIA 07/08/2007   HYPERTENSION 07/08/2007   LEUKOPENIA, MILD 09/29/2008   OBESITY 01/06/2008   OSTEOARTHRITIS 01/06/2008   Other chronic nonalcoholic liver disease 54/65/0354   PERIPHERAL NEUROPATHY 11/21/2007   Peripheral neuropathy    Postsurgical hypothyroidism 09/19/2010   SINUSITIS- ACUTE-NOS 11/21/2007   TB SKIN TEST, POSITIVE 02/06/2010   THYROID NODULE 03/09/2010    Family History  Problem Relation Age of Onset   Diabetes Mother    Neuropathy Mother    Diabetes Father    Congestive Heart Failure Father    Hypertension Father     Myelodysplastic syndrome Father    Bladder Cancer Sister    Multiple sclerosis Sister    Breast cancer Sister        breast onset age 60   Osteoporosis Sister    Liver cancer Maternal Aunt    Breast cancer Maternal Aunt    Diabetes Maternal Aunt    Colon cancer Maternal Uncle    Diabetes Maternal Uncle    Neuropathy Paternal Aunt    Diabetes Maternal Grandmother    Diabetes Paternal Grandmother    Past Surgical History:  Procedure Laterality Date   ABDOMINAL HYSTERECTOMY     BASAL CELL CARCINOMA EXCISION     CARDIAC CATHETERIZATION     CHOLECYSTECTOMY N/A 05/11/2021   Procedure: LAPAROSCOPIC CHOLECYSTECTOMY WITH POSSIBLE  INTRAOPERATIVE CHOLANGIOGRAM;  Surgeon: Johnathan Hausen, MD;  Location: [facility];  Service: General;  Laterality: N/A;   COLONOSCOPY     CORONARY ATHERECTOMY N/A 05/03/2022   Procedure: CORONARY ATHERECTOMY;  Surgeon: Early Osmond, MD;  Location: [place] [facility];  Service: Cardiovascular;  Laterality: N/A;   CORONARY STENT INTERVENTION N/A 05/03/2022   Procedure: CORONARY STENT INTERVENTION;  Surgeon: Early Osmond, MD;  Location: [place] [facility];  Service: Cardiovascular;  Laterality: N/A;  lad   EYE SURGERY Bilateral 08/2018, 09/2018   FOOT SURGERY Left    10/2019   INTRAVASCULAR IMAGING/OCT  N/A 05/03/2022   Procedure: INTRAVASCULAR IMAGING/OCT;  Surgeon: Early Osmond, MD;  Location: [place] [facility];  Service: Cardiovascular;  Laterality: N/A;   INTRAVASCULAR PRESSURE WIRE/FFR STUDY N/A 05/03/2022   Procedure: INTRAVASCULAR PRESSURE WIRE/FFR STUDY;  Surgeon: Early Osmond, MD;  Location: [place] [facility];  Service: Cardiovascular;  Laterality: N/A;   KNEE ARTHROSCOPY Left    LEFT HEART CATH AND CORONARY ANGIOGRAPHY N/A 01/20/2019   Procedure: LEFT HEART CATH AND CORONARY ANGIOGRAPHY;  Surgeon: Belva Crome, MD;  Location: [place] [facility];  Service: Cardiovascular;  Laterality: N/A;   LYMPH NODE DISSECTION     OOPHORECTOMY     Right  total knee replacement     RIGHT/LEFT HEART CATH AND CORONARY ANGIOGRAPHY N/A 05/03/2022   Procedure: RIGHT/LEFT HEART CATH AND CORONARY ANGIOGRAPHY;  Surgeon: Early Osmond, MD;  Location: [place] [facility];  Service: Cardiovascular;  Laterality: N/A;   ROUX-EN-Y GASTRIC BYPASS  2011   SKIN TAG REMOVAL     THYROIDECTOMY     TONSILLECTOMY AND ADENOIDECTOMY     UPPER GASTROINTESTINAL ENDOSCOPY     Social History   Social History Narrative   Not on file   Immunization History  Administered Date(s) Administered   Fluad Quad(high Dose 65+) 07/13/2019   Influenza Inj Mdck Quad Pf 10/19/2017, 07/23/2018   Influenza Whole 07/30/2008, 08/17/2009, 09/19/2010   Influenza, High Dose Seasonal PF 08/15/2021   Influenza,inj,Quad PF,6-35 Mos 07/14/2015   Influenza-Unspecified 10/13/2011, 08/12/2013, 07/13/2017, 07/23/2018   PFIZER(Purple Top)SARS-COV-2 Vaccination 12/21/2019, 01/14/2020, 08/18/2020   Pfizer Covid-19 Vaccine Bivalent Booster 37yr & up 08/15/2021   Pneumococcal Conjugate-13 10/14/2015   Pneumococcal Polysaccharide-23 06/25/2014, 10/21/2020   Tdap 05/20/2020   Zoster Recombinat (Shingrix) 05/20/2020   Zoster, Live 06/25/2014     Objective: Vital Signs: BP 109/77 (BP Location: Left Arm, Patient Position: Sitting, Cuff Size: Normal)   Pulse 73   Ht 5' 11"  (1.803 m)   Wt 177 lb 9.6 oz (80.6 kg)   BMI 24.77 kg/m    Physical Exam Vitals and nursing note reviewed.  Constitutional:      Appearance: She is well-developed.  HENT:     Head: Normocephalic and atraumatic.  Eyes:     Conjunctiva/sclera: Conjunctivae normal.  Cardiovascular:     Rate and Rhythm: Normal rate and regular rhythm.     Heart sounds: Normal heart sounds.  Pulmonary:     Effort: Pulmonary effort is normal.     Breath sounds: Normal breath sounds.  Abdominal:     General: Bowel sounds are normal.     Palpations: Abdomen is soft.  Musculoskeletal:     Cervical back: Normal range of motion.   Skin:    General: Skin is warm and dry.     Capillary Refill: Capillary refill takes less than 2 seconds.     Findings: Bruising (Dorsal aspect of both forearms) present.  Neurological:     Mental Status: She is alert and oriented to person, place, and time.  Psychiatric:        Behavior: Behavior normal.      Musculoskeletal Exam: C-spine has limited ROM with some discomfort and stiffness.  Painful ROM of lumbar spine.  Shoulder joints and elbow joints have good ROM.  Limited ROM of right wrist with tenderness.  PIP and DIP thickening.  Tenderness of the left 2nd PIP joint.  Hip joints have good ROM. Right knee replacement has warmth.  Left knee has good ROM with no warmth  or effusion.  Left LE edema.  Painful and limited ROM of left ankle.  PIP and DIP thickening consistent with OA of both feet.    CDAI Exam: CDAI Score: -- Patient Global: --; Provider Global: -- Swollen: --; Tender: -- Joint Exam 06/07/2022   No joint exam has been documented for this visit   There is currently no information documented on the homunculus. Go to the Rheumatology activity and complete the homunculus joint exam.  Investigation: No additional findings.  Imaging: [place] Lower Ext Art Bilat  Result Date: 06/06/2022 CLINICAL DATA:  66 year old female with edema EXAM: NONINVASIVE PHYSIOLOGIC VASCULAR STUDY OF BILATERAL LOWER EXTREMITIES TECHNIQUE: Evaluation of both lower extremities was performed at rest, including calculation of ankle-brachial indices, segmental pressure evaluation, segmental Doppler and directed duplex COMPARISON:  None Available. FINDINGS: Right ABI:  1.59 Left ABI:  1.68 Right Lower Extremity: Symmetric upper extremity brachial pressure Digital pressure 570 systolic Segmental Doppler demonstrates triphasic waveforms at the ankle PVR maintained at the ankle Directed duplex demonstrates triphasic waveform of the femoropopliteal system. Triphasic anterior tibial artery and posterior tibial  artery Left Lower Extremity: Symmetric upper extremity pressure Digital pressure 177 systolic Segmental Doppler demonstrates triphasic dorsalis pedis and monophasic posterior tibial artery PVR maintained at the ankle Directed duplex demonstrates triphasic femoropopliteal system. Triphasic anterior tibial artery. Triphasic posterior tibial artery IMPRESSION: Resting ABI the bilateral lower extremities elevated, potentially secondary to noncompressible vessels. Segmental exam and directed duplex of the bilateral lower extremity demonstrates waveforms within normal limits throughout. Signed, Dulcy Fanny. Nadene Rubins, RPVI [facility] Radiology Electronically Signed   By: Corrie Mckusick D.O.   On: 06/06/2022 14:51   US ARTERIAL ABI (SCREENING LOWER EXTREMITY)  Result Date: 06/06/2022 CLINICAL DATA:  66 year old female with edema EXAM: NONINVASIVE PHYSIOLOGIC VASCULAR STUDY OF BILATERAL LOWER EXTREMITIES TECHNIQUE: Evaluation of both lower extremities was performed at rest, including calculation of ankle-brachial indices, segmental pressure evaluation, segmental Doppler and directed duplex COMPARISON:  None Available. FINDINGS: Right ABI:  1.59 Left ABI:  1.68 Right Lower Extremity: Symmetric upper extremity brachial pressure Digital pressure 939 systolic Segmental Doppler demonstrates triphasic waveforms at the ankle PVR maintained at the ankle Directed duplex demonstrates triphasic waveform of the femoropopliteal system. Triphasic anterior tibial artery and posterior tibial artery Left Lower Extremity: Symmetric upper extremity pressure Digital pressure 030 systolic Segmental Doppler demonstrates triphasic dorsalis pedis and monophasic posterior tibial artery PVR maintained at the ankle Directed duplex demonstrates triphasic femoropopliteal system. Triphasic anterior tibial artery. Triphasic posterior tibial artery IMPRESSION: Resting ABI the bilateral lower  extremities elevated, potentially secondary to noncompressible vessels. Segmental exam and directed duplex of the bilateral lower extremity demonstrates waveforms within normal limits throughout. Signed, Dulcy Fanny. Nadene Rubins, RPVI [facility] Radiology Electronically Signed   By: Corrie Mckusick D.O.   On: 06/06/2022 14:51   US Venous Img Lower Bilateral (DVT)  Result Date: 06/04/2022 CLINICAL DATA:  Bilateral lower extremity edema, left greater than right. Remote history of uterine cancer. Evaluate for DVT. EXAM: BILATERAL LOWER EXTREMITY VENOUS DOPPLER ULTRASOUND TECHNIQUE: Gray-scale sonography with graded compression, as well as color Doppler and duplex ultrasound were performed to evaluate the lower extremity deep venous systems from the level of the common femoral vein and including the common femoral, femoral, profunda femoral, popliteal and calf veins including the posterior tibial, peroneal and gastrocnemius veins when visible. The superficial great saphenous vein was also interrogated. Spectral Doppler was utilized to evaluate flow  at rest and with distal augmentation maneuvers in the common femoral, femoral and popliteal veins. COMPARISON:  None Available. FINDINGS: RIGHT LOWER EXTREMITY Common Femoral Vein: No evidence of thrombus. Normal compressibility, respiratory phasicity and response to augmentation. Saphenofemoral Junction: No evidence of thrombus. Normal compressibility and flow on color Doppler imaging. Profunda Femoral Vein: No evidence of thrombus. Normal compressibility and flow on color Doppler imaging. Femoral Vein: No evidence of thrombus. Normal compressibility, respiratory phasicity and response to augmentation. Popliteal Vein: No evidence of thrombus. Normal compressibility, respiratory phasicity and response to augmentation. Calf Veins: No evidence of thrombus. Normal compressibility and flow on color Doppler imaging. Superficial  Great Saphenous Vein: No evidence of thrombus. Normal compressibility. Other Findings:  None. LEFT LOWER EXTREMITY Common Femoral Vein: No evidence of thrombus. Normal compressibility, respiratory phasicity and response to augmentation. Saphenofemoral Junction: No evidence of thrombus. Normal compressibility and flow on color Doppler imaging. Profunda Femoral Vein: No evidence of thrombus. Normal compressibility and flow on color Doppler imaging. Femoral Vein: No evidence of thrombus. Normal compressibility, respiratory phasicity and response to augmentation. Popliteal Vein: No evidence of thrombus. Normal compressibility, respiratory phasicity and response to augmentation. Calf Veins: No evidence of thrombus. Normal compressibility and flow on color Doppler imaging. Superficial Great Saphenous Vein: No evidence of thrombus. Normal compressibility. Other Findings: There is a minimal amount of subcutaneous edema at the level of the left calf. IMPRESSION: No evidence of DVT within either lower extremity. Electronically Signed   By: Sandi Mariscal M.D.   On: 06/04/2022 15:39    Recent Labs: Lab Results  Component Value Date   WBC 3.6 05/01/2022   HGB 12.2 05/03/2022   PLT 187 05/01/2022   NA 140 05/03/2022   K 4.4 05/03/2022   CL 100 05/01/2022   CO2 25 05/01/2022   GLUCOSE 188 (H) 05/01/2022   BUN 22 05/01/2022   CREATININE 1.06 (H) 05/01/2022   BILITOT 0.4 03/01/2022   ALKPHOS 111 03/01/2022   AST 31 03/01/2022   ALT 47 (H) 03/01/2022   PROT 5.4 (L) 03/01/2022   ALBUMIN 3.7 03/01/2022   CALCIUM 9.5 05/01/2022   GFRAA 73 08/16/2020    Speciality Comments: No specialty comments available.  Procedures:  No procedures performed Allergies: Ace inhibitors, Caffeine, Ciprofloxacin, Morphine and related, Mucinex [guaifenesin er], Nsaids, Other, Silicone, and Tape   Assessment / Plan:     Visit Diagnoses: Inflammatory arthritis - RF-, anti-CCP-, 14-3-3 eta-, ANA-, HLAB27-, uric acid 3.8.  Responsive to colchicine-likely she has pseudogout: She remains on colchicine 0.6 mg 1 capsule by mouth daily.  She is tolerating colchicine without any side effects and continues to find it to be effective at managing her inflammatory arthritis.  If she misses doses of colchicine she notices increased joint pain, stiffness, and inflammation.  On examination today no synovitis was noted.  She will remain on the current treatment regimen.  She is planning on establishing care with pain management on Monday to better manage her overall chronic pain.  Medication monitoring encounter: Colchicine 0.6 mg 1 capsule by mouth daily.  CBC within normal limits on 05/01/2022.  BMP updated on 05/01/2022.  Primary osteoarthritis of both hands: She has PIP and DIP thickening consistent with osteoarthritis of both hands.  She continues to have chronic pain in both hands especially her right hand but has had increased discomfort in the left index finger.  On examination today she had no synovitis.  Overall her inflammation has been well controlled taking colchicine on a daily  basis. She also had a right wrist and right CMC joint injection performed by Dr. Tempie Donning on 03/13/2022 which has provided temporary relief.  She is not ready to proceed with surgery at this time.  Discussed the importance of joint protection and muscle strengthening.  Trochanteric bursitis of both hips: She continues to experience discomfort due to trochanter bursitis of both hips.  Status post total right knee replacement - Performed by Dr. Veverly Fells.  Chronic pain.  Warmth but no effusion noted.  Primary osteoarthritis of left knee - She is followed by Dr.Xu.  No warmth or effusion noted.  Primary osteoarthritis of both feet: She has PIP and DIP thickening consistent with osteoarthritis of both feet.  Thickening of bilateral first MTP joints.  Chronic pain and swelling of the left ankle status post calcaneal fracture in the past.  Unsuccessful surgery  has resulted in chronic pain in her left ankle.  She has increased pedal edema in the left lower extremity compared to the right.  DDD (degenerative disc disease), cervical - She was evaluated by Dr.Ahern.  Reviewed her MRI from July 07, 2021 which showed degenerative changes. Limited ROM of the c-spine.   DDD (degenerative disc disease), lumbar - MRI done by Dr. Jaynee Eagles from July 07, 2021 showed degenerative changes and facet joint arthropathy. Chronic pain.  Establishing care with pain management on Monday.   Age-related osteoporosis without current pathological fracture - DEXA updated on 01/11/21: Right femoral neck- T-score -2.5 and a -25.9% change in BMD from previous DEXA. She is taking vitamin D 4000 units daily.  She has declined osteoporosis treatment due to concern for side effects and risk for osteonecrosis of jaw given history of dental work.  Due to update DEXA in March 2024.  Vitamin D deficiency: She is taking vitamin D 4000 units daily.   Other medical conditions are listed as follows:   NASH (nonalcoholic steatohepatitis)  History of peripheral neuropathy - gabapentin 300 mg 4 capsules by mouth daily for management of neuropathy.   History of diabetes mellitus, type II  Postsurgical hypothyroidism  Positive PPD  History of uterine cancer  History of hyperlipidemia  Status post gastric bypass for obesity  History of gastroesophageal reflux (GERD)  Eosinophilic esophagitis  Status post cholecystectomy  Orders: No orders of the defined types were placed in this encounter.  No orders of the defined types were placed in this encounter.    Follow-Up Instructions: Return in about 6 months (around 12/08/2022) for Inflammatory arthritis, DDD, Osteoporosis.   Ofilia Neas, PA-C  Note - This record has been created using Dragon software.  Chart creation errors have been sought, but may not always  have been located. Such creation errors do not reflect on  the  standard of medical care.

## 2022-05-28 ENCOUNTER — Other Ambulatory Visit: Payer: Self-pay | Admitting: Family

## 2022-05-28 ENCOUNTER — Other Ambulatory Visit: Payer: Medicare Other

## 2022-05-28 DIAGNOSIS — N3001 Acute cystitis with hematuria: Secondary | ICD-10-CM | POA: Diagnosis not present

## 2022-05-28 DIAGNOSIS — Z87440 Personal history of urinary (tract) infections: Secondary | ICD-10-CM | POA: Diagnosis not present

## 2022-05-28 DIAGNOSIS — R600 Localized edema: Secondary | ICD-10-CM

## 2022-05-29 ENCOUNTER — Other Ambulatory Visit (HOSPITAL_COMMUNITY): Payer: Self-pay

## 2022-05-29 ENCOUNTER — Ambulatory Visit (HOSPITAL_BASED_OUTPATIENT_CLINIC_OR_DEPARTMENT_OTHER): Payer: Medicare Other | Admitting: Family

## 2022-05-29 ENCOUNTER — Ambulatory Visit: Payer: Medicare Other | Admitting: Physician Assistant

## 2022-05-29 DIAGNOSIS — M7062 Trochanteric bursitis, left hip: Secondary | ICD-10-CM

## 2022-05-29 DIAGNOSIS — E559 Vitamin D deficiency, unspecified: Secondary | ICD-10-CM

## 2022-05-29 DIAGNOSIS — Z9884 Bariatric surgery status: Secondary | ICD-10-CM

## 2022-05-29 DIAGNOSIS — M5136 Other intervertebral disc degeneration, lumbar region: Secondary | ICD-10-CM

## 2022-05-29 DIAGNOSIS — M1990 Unspecified osteoarthritis, unspecified site: Secondary | ICD-10-CM

## 2022-05-29 DIAGNOSIS — Z96651 Presence of right artificial knee joint: Secondary | ICD-10-CM

## 2022-05-29 DIAGNOSIS — M19041 Primary osteoarthritis, right hand: Secondary | ICD-10-CM

## 2022-05-29 DIAGNOSIS — Z9049 Acquired absence of other specified parts of digestive tract: Secondary | ICD-10-CM

## 2022-05-29 DIAGNOSIS — M19072 Primary osteoarthritis, left ankle and foot: Secondary | ICD-10-CM

## 2022-05-29 DIAGNOSIS — Z8719 Personal history of other diseases of the digestive system: Secondary | ICD-10-CM

## 2022-05-29 DIAGNOSIS — K7581 Nonalcoholic steatohepatitis (NASH): Secondary | ICD-10-CM

## 2022-05-29 DIAGNOSIS — R7611 Nonspecific reaction to tuberculin skin test without active tuberculosis: Secondary | ICD-10-CM

## 2022-05-29 DIAGNOSIS — E890 Postprocedural hypothyroidism: Secondary | ICD-10-CM

## 2022-05-29 DIAGNOSIS — Z8669 Personal history of other diseases of the nervous system and sense organs: Secondary | ICD-10-CM

## 2022-05-29 DIAGNOSIS — K200 Eosinophilic esophagitis: Secondary | ICD-10-CM

## 2022-05-29 DIAGNOSIS — M5030 Other cervical disc degeneration, unspecified cervical region: Secondary | ICD-10-CM

## 2022-05-29 DIAGNOSIS — Z8639 Personal history of other endocrine, nutritional and metabolic disease: Secondary | ICD-10-CM

## 2022-05-29 DIAGNOSIS — M1712 Unilateral primary osteoarthritis, left knee: Secondary | ICD-10-CM

## 2022-05-29 DIAGNOSIS — Z8542 Personal history of malignant neoplasm of other parts of uterus: Secondary | ICD-10-CM

## 2022-05-29 DIAGNOSIS — M810 Age-related osteoporosis without current pathological fracture: Secondary | ICD-10-CM

## 2022-05-30 ENCOUNTER — Other Ambulatory Visit: Payer: Medicare Other

## 2022-05-31 ENCOUNTER — Telehealth: Payer: Self-pay | Admitting: Internal Medicine

## 2022-05-31 NOTE — Telephone Encounter (Signed)
   Pre-operative Risk Assessment    Patient Name: Savannah [name]  DOB: 02/20/1956 MRN: [id number]     Request for Surgical Clearance    Procedure:   Filling  Date of Surgery:  Clearance 06/04/22                                 Surgeon:  Dr. Jetty Duhamel Surgeon's Group or Practice Name: [facility] Phone number:  [phone] Fax number:  [phone]   Type of Clearance Requested:   - Pharmacy:  Hold Clopidogrel (Plavix)     Type of Anesthesia:   Carbocaine and Septocaine   Additional requests/questions:  Please advise surgeon/provider what medications should be held.  Signed, Belisicia T Harris   05/31/2022, 9:19 AM

## 2022-06-01 NOTE — Telephone Encounter (Signed)
   Primary Cardiologist: Elouise Munroe, MD  Chart reviewed as part of pre-operative protocol coverage. Simple dental extractions are considered low risk procedures per guidelines and generally do not require any specific cardiac clearance. It is also generally accepted that for simple extractions and dental cleanings, there is no need to interrupt blood thinner therapy. Due to patient's recent PCI and stent placement, would recommend that she wait 6 weeks for dental procedures.   SBE prophylaxis is not required for the patient.  I will route this recommendation to the requesting party via Epic fax function and remove from pre-op pool.  Please call with questions.  Emmaline Life, NP-C    06/01/2022, 8:46 AM [place] [address], [address] Office [phone] Fax [phone]

## 2022-06-02 ENCOUNTER — Other Ambulatory Visit: Payer: Self-pay | Admitting: Internal Medicine

## 2022-06-04 ENCOUNTER — Other Ambulatory Visit: Payer: Medicare Other

## 2022-06-04 ENCOUNTER — Ambulatory Visit
Admission: RE | Admit: 2022-06-04 | Discharge: 2022-06-04 | Disposition: A | Discharge status: Home / Self Care | Payer: Medicare Other | Source: Ambulatory Visit | Attending: Family | Admitting: Family

## 2022-06-04 DIAGNOSIS — R600 Localized edema: Secondary | ICD-10-CM | POA: Diagnosis not present

## 2022-06-05 NOTE — Progress Notes (Signed)
Hi Savannah [name],  Your Ultrasound results are negative for any clot, great news! It did note a little fluid swelling in your left calf.  Take care.

## 2022-06-06 ENCOUNTER — Ambulatory Visit: Payer: Medicare Other | Admitting: Endocrinology

## 2022-06-06 ENCOUNTER — Ambulatory Visit
Admission: RE | Admit: 2022-06-06 | Discharge: 2022-06-06 | Disposition: A | Discharge status: Home / Self Care | Payer: Medicare Other | Source: Ambulatory Visit | Attending: Family | Admitting: Family

## 2022-06-06 DIAGNOSIS — R600 Localized edema: Secondary | ICD-10-CM

## 2022-06-07 ENCOUNTER — Ambulatory Visit (INDEPENDENT_AMBULATORY_CARE_PROVIDER_SITE_OTHER): Payer: Medicare Other | Admitting: Physician Assistant

## 2022-06-07 ENCOUNTER — Encounter: Payer: Self-pay | Admitting: Physician Assistant

## 2022-06-07 VITALS — BP 109/77 | HR 73 | Ht 71.0 in | Wt 177.6 lb

## 2022-06-07 DIAGNOSIS — R7611 Nonspecific reaction to tuberculin skin test without active tuberculosis: Secondary | ICD-10-CM

## 2022-06-07 DIAGNOSIS — Z9884 Bariatric surgery status: Secondary | ICD-10-CM

## 2022-06-07 DIAGNOSIS — Z8669 Personal history of other diseases of the nervous system and sense organs: Secondary | ICD-10-CM

## 2022-06-07 DIAGNOSIS — Z8719 Personal history of other diseases of the digestive system: Secondary | ICD-10-CM

## 2022-06-07 DIAGNOSIS — M51369 Other intervertebral disc degeneration, lumbar region without mention of lumbar back pain or lower extremity pain: Secondary | ICD-10-CM

## 2022-06-07 DIAGNOSIS — Z5181 Encounter for therapeutic drug level monitoring: Secondary | ICD-10-CM | POA: Diagnosis not present

## 2022-06-07 DIAGNOSIS — M5030 Other cervical disc degeneration, unspecified cervical region: Secondary | ICD-10-CM | POA: Diagnosis not present

## 2022-06-07 DIAGNOSIS — M19041 Primary osteoarthritis, right hand: Secondary | ICD-10-CM | POA: Diagnosis not present

## 2022-06-07 DIAGNOSIS — M1990 Unspecified osteoarthritis, unspecified site: Secondary | ICD-10-CM

## 2022-06-07 DIAGNOSIS — K200 Eosinophilic esophagitis: Secondary | ICD-10-CM

## 2022-06-07 DIAGNOSIS — Z9049 Acquired absence of other specified parts of digestive tract: Secondary | ICD-10-CM

## 2022-06-07 DIAGNOSIS — M19071 Primary osteoarthritis, right ankle and foot: Secondary | ICD-10-CM

## 2022-06-07 DIAGNOSIS — K7581 Nonalcoholic steatohepatitis (NASH): Secondary | ICD-10-CM | POA: Diagnosis not present

## 2022-06-07 DIAGNOSIS — Z8639 Personal history of other endocrine, nutritional and metabolic disease: Secondary | ICD-10-CM

## 2022-06-07 DIAGNOSIS — M7062 Trochanteric bursitis, left hip: Secondary | ICD-10-CM

## 2022-06-07 DIAGNOSIS — M5136 Other intervertebral disc degeneration, lumbar region: Secondary | ICD-10-CM

## 2022-06-07 DIAGNOSIS — M19072 Primary osteoarthritis, left ankle and foot: Secondary | ICD-10-CM

## 2022-06-07 DIAGNOSIS — M810 Age-related osteoporosis without current pathological fracture: Secondary | ICD-10-CM | POA: Diagnosis not present

## 2022-06-07 DIAGNOSIS — M7061 Trochanteric bursitis, right hip: Secondary | ICD-10-CM

## 2022-06-07 DIAGNOSIS — M1712 Unilateral primary osteoarthritis, left knee: Secondary | ICD-10-CM

## 2022-06-07 DIAGNOSIS — Z96651 Presence of right artificial knee joint: Secondary | ICD-10-CM

## 2022-06-07 DIAGNOSIS — E890 Postprocedural hypothyroidism: Secondary | ICD-10-CM

## 2022-06-07 DIAGNOSIS — Z8542 Personal history of malignant neoplasm of other parts of uterus: Secondary | ICD-10-CM

## 2022-06-07 DIAGNOSIS — E559 Vitamin D deficiency, unspecified: Secondary | ICD-10-CM | POA: Diagnosis not present

## 2022-06-07 DIAGNOSIS — M19042 Primary osteoarthritis, left hand: Secondary | ICD-10-CM

## 2022-06-08 ENCOUNTER — Telehealth: Payer: Self-pay

## 2022-06-08 NOTE — Telephone Encounter (Signed)
She returned my call, she confirms she wants to attend PREP on August 8; assessment visit scheduled for Aug 3 at 2pm.

## 2022-06-10 ENCOUNTER — Other Ambulatory Visit: Payer: Self-pay | Admitting: Family

## 2022-06-10 DIAGNOSIS — R6889 Other general symptoms and signs: Secondary | ICD-10-CM

## 2022-06-10 NOTE — Progress Notes (Signed)
Your vascular studies indicate high indices which may indicate calcification in your leg arteries, so I think it would be a good idea for you to consult with a vascular specialist, and I have sent a referral. They will call you directly. Take care!

## 2022-06-11 ENCOUNTER — Other Ambulatory Visit: Payer: Self-pay | Admitting: Physician Assistant

## 2022-06-11 DIAGNOSIS — M47816 Spondylosis without myelopathy or radiculopathy, lumbar region: Secondary | ICD-10-CM | POA: Diagnosis not present

## 2022-06-11 DIAGNOSIS — G894 Chronic pain syndrome: Secondary | ICD-10-CM | POA: Diagnosis not present

## 2022-06-11 DIAGNOSIS — Z79891 Long term (current) use of opiate analgesic: Secondary | ICD-10-CM | POA: Diagnosis not present

## 2022-06-11 DIAGNOSIS — M791 Myalgia, unspecified site: Secondary | ICD-10-CM | POA: Diagnosis not present

## 2022-06-11 DIAGNOSIS — M47812 Spondylosis without myelopathy or radiculopathy, cervical region: Secondary | ICD-10-CM | POA: Diagnosis not present

## 2022-06-11 NOTE — Telephone Encounter (Signed)
Next Visit: 12/13/2022  Last Visit: 06/07/2022  Last Fill: 04/02/2022  Dx: DDD (degenerative disc disease), lumbar   Current Dose per office note on 06/07/2022: not discussed.   Okay to refill Lidocaine patches?

## 2022-06-13 DIAGNOSIS — H353131 Nonexudative age-related macular degeneration, bilateral, early dry stage: Secondary | ICD-10-CM | POA: Diagnosis not present

## 2022-06-13 DIAGNOSIS — H04123 Dry eye syndrome of bilateral lacrimal glands: Secondary | ICD-10-CM | POA: Diagnosis not present

## 2022-06-13 DIAGNOSIS — E113212 Type 2 diabetes mellitus with mild nonproliferative diabetic retinopathy with macular edema, left eye: Secondary | ICD-10-CM | POA: Diagnosis not present

## 2022-06-14 ENCOUNTER — Telehealth: Payer: Self-pay

## 2022-06-14 NOTE — Telephone Encounter (Signed)
Called to let me know she wasn't going to be able to attend PREP assessment visit today. She does still want to participate, rescheduled for Monday 8/7 at 11am.

## 2022-06-18 ENCOUNTER — Telehealth: Payer: Self-pay

## 2022-06-18 NOTE — Progress Notes (Unsigned)
[facility] PREP Evaluation  Patient Details  Name: Savannah [name] MRN: [id number] Date of Birth: 05/12/1956 Age: 66 y.o. PCP: Vivi Barrack, MD  Vitals:   06/18/22 1137  BP: (!) 84/50  Pulse: 77  SpO2: 97%  Weight: 176 lb (79.8 kg)     [facility] Eval - 06/18/22 1100       [facility] "PREP" Location   [facility] "PREP" Location [facility]      Referral    Referring Provider C. Taylor    Reason for referral Diabetes;Heart Failure;Inactivity    Program Start Date 06/19/22      Measurement   Waist Circumference 39 inches    Hip Circumference 43.5 inches    Body fat --   did not measure     Information for Trainer   Goals --   establish exercise routine, lose 5-10 pounds by end of program   Current Exercise none    Orthopedic Concerns --   Arthritis, s/p R TKA, L knee arthroscopy, R hand arthritis, L foot plantar fasciatis   Pertinent Medical History --   CAD, s/p PTCA w/stent, diabetes     Timed Up and Go (TUGS)   Timed Up and Go Low risk <9 seconds      Mobility and Daily Activities   I find it easy to walk up or down two or more flights of stairs. 1    I have no trouble taking out the trash. 2    I do housework such as vacuuming and dusting on my own without difficulty. 4    I can easily lift a gallon of milk (8lbs). 2    I can easily walk a mile. 4    I have no trouble reaching into high cupboards or reaching down to pick up something from the floor. 1    I do not have trouble doing out-door work such as Armed forces logistics/support/administrative officer, raking leaves, or gardening. 2      Mobility and Daily Activities   I feel younger than my age. 1    I feel independent. 4    I feel energetic. 1    I live an active life.  1    I feel strong. 1    I feel healthy. 1    I feel active as other people my age. 1      How fit and strong are you.   Fit and Strong Total Score 26            Past Medical History:  Diagnosis Date   ABDOMINAL PAIN, CHRONIC 10/20/2009   Acute cystitis 08/17/2009   ALLERGIC  RHINITIS 11/21/2007   Allergy    ASYMPTOMATIC POSTMENOPAUSAL STATUS 09/29/2008   Bariatric surgery status 07/07/2010   Bariatric surgery status 07/07/2010   Qualifier: Diagnosis of  By: Loanne Drilling MD, Sean A    Cancer [facility])    uterine   Cataract    Coronary artery disease    DEPRESSION 11/21/2007   DIABETES MELLITUS, TYPE II 07/08/2007   DYSPNEA 05/20/2009   Edema 81/15/7262   Eosinophilic esophagitis 03/55/9741   FEVER UNSPECIFIED 10/20/2009   FEVER, HX OF 03/09/2010   GERD (gastroesophageal reflux disease)    Headache(784.0) 02/06/2010   Hepatomegaly 01/06/2008   HYPERLIPIDEMIA 07/08/2007   HYPERTENSION 07/08/2007   LEUKOPENIA, MILD 09/29/2008   OBESITY 01/06/2008   OSTEOARTHRITIS 01/06/2008   Other chronic nonalcoholic liver disease 63/84/5364   PERIPHERAL NEUROPATHY 11/21/2007   Peripheral neuropathy    Postsurgical  hypothyroidism 09/19/2010   SINUSITIS- ACUTE-NOS 11/21/2007   TB SKIN TEST, POSITIVE 02/06/2010   THYROID NODULE 03/09/2010   Past Surgical History:  Procedure Laterality Date   ABDOMINAL HYSTERECTOMY     BASAL CELL CARCINOMA EXCISION     CARDIAC CATHETERIZATION     CHOLECYSTECTOMY N/A 05/11/2021   Procedure: LAPAROSCOPIC CHOLECYSTECTOMY WITH POSSIBLE  INTRAOPERATIVE CHOLANGIOGRAM;  Surgeon: Johnathan Hausen, MD;  Location: [facility];  Service: General;  Laterality: N/A;   COLONOSCOPY     CORONARY ATHERECTOMY N/A 05/03/2022   Procedure: CORONARY ATHERECTOMY;  Surgeon: Early Osmond, MD;  Location: [place] [facility];  Service: Cardiovascular;  Laterality: N/A;   CORONARY STENT INTERVENTION N/A 05/03/2022   Procedure: CORONARY STENT INTERVENTION;  Surgeon: Early Osmond, MD;  Location: [place] [facility];  Service: Cardiovascular;  Laterality: N/A;  lad   EYE SURGERY Bilateral 08/2018, 09/2018   FOOT SURGERY Left    10/2019   INTRAVASCULAR IMAGING/OCT N/A 05/03/2022   Procedure: INTRAVASCULAR IMAGING/OCT;  Surgeon: Early Osmond, MD;  Location: [place] [facility];  Service: Cardiovascular;  Laterality: N/A;   INTRAVASCULAR PRESSURE WIRE/FFR STUDY N/A 05/03/2022   Procedure: INTRAVASCULAR PRESSURE WIRE/FFR STUDY;  Surgeon: Early Osmond, MD;  Location: [place] [facility];  Service: Cardiovascular;  Laterality: N/A;   KNEE ARTHROSCOPY Left    LEFT HEART CATH AND CORONARY ANGIOGRAPHY N/A 01/20/2019   Procedure: LEFT HEART CATH AND CORONARY ANGIOGRAPHY;  Surgeon: Belva Crome, MD;  Location: [place] [facility];  Service: Cardiovascular;  Laterality: N/A;   LYMPH NODE DISSECTION     OOPHORECTOMY     Right total knee replacement     RIGHT/LEFT HEART CATH AND CORONARY ANGIOGRAPHY N/A 05/03/2022   Procedure: RIGHT/LEFT HEART CATH AND CORONARY ANGIOGRAPHY;  Surgeon: Early Osmond, MD;  Location: [place] [facility];  Service: Cardiovascular;  Laterality: N/A;   ROUX-EN-Y GASTRIC BYPASS  2011   SKIN TAG REMOVAL     THYROIDECTOMY     TONSILLECTOMY AND ADENOIDECTOMY     UPPER GASTROINTESTINAL ENDOSCOPY     Social History   Tobacco Use  Smoking Status Former   Packs/day: 1.50   Years: 15.00   Total pack years: 22.50   Types: Cigarettes   Quit date: 25   Years since quitting: 31.6  Smokeless Tobacco Never  To begin PREP classes at [facility] 8/8, every T/Th 10-11:15; of note BP is 84/50, states she feels washed out and a little unsteady on standing, recently started Amlodipine; have asked her to check BP at home, but only has wrist monitor, will re-check BP in am prior to start of class. Will update [name] today.  Savannah [name] 06/18/2022, 11:42 AM

## 2022-06-18 NOTE — Telephone Encounter (Signed)
Call with [name] to advise of BP 84/50 today at assessment visit; plan to re-assess BP tomorrow when PREP class begins and will follow up with findings.

## 2022-06-19 ENCOUNTER — Telehealth (HOSPITAL_BASED_OUTPATIENT_CLINIC_OR_DEPARTMENT_OTHER): Payer: Self-pay | Admitting: Family

## 2022-06-19 NOTE — Telephone Encounter (Signed)
Spoke with Zacarias Pontes from [employer] program regarding patient. Initial BP 100/60 with BP post exercise 88/50 associated with some feeling washed out, fatigued.   Recommend discontinue Amlodipine.   Ensure staying hydrated and having something to eat and drink prior to PREP classes to help keep BP up.   Loel Dubonnet, NP

## 2022-06-19 NOTE — Progress Notes (Signed)
[facility] PRE[facility] Weekly Session  Patient Details  Name: Savannah [name] MRN: [id number] Date of Birth: 04/07/1956 Age: 66 y.o. PCP: Vivi Barrack, MD  There were no vitals filed for this visit.   [facility] Weekly seesion - 06/19/22 1100       [facility] "PREP" Location   [facility] "PREP[facility] Location [name]      Weekly Session   Topic Discussed Goal setting and welcome to the program   Introductions, review of workbook, tour of facility, offered option to exercise on cardio machine   Classes attended to date 1           Arrived early for BP check: 100/60; feels better today; after class and walking on treademill, B/P 88/50; she reports she took Amlodipine last night; Called [name] on her cell while pt still in my office to update BP readings before/after exercise, left her vm to follow up with pt on next steps.   [name] 06/19/2022, 11:53 AM

## 2022-06-19 NOTE — Telephone Encounter (Signed)
RN attempted to call patient, no answer, left message with the following instructions!      "Spoke with Zacarias Pontes from [employer] program regarding patient. Initial BP 100/60 with BP post exercise 88/50 associated with some feeling washed out, fatigued.    Recommend discontinue Amlodipine.    Ensure staying hydrated and having something to eat and drink prior to PREP classes to help keep BP up.    Loel Dubonnet, NP "

## 2022-06-21 ENCOUNTER — Other Ambulatory Visit: Payer: Self-pay

## 2022-06-21 ENCOUNTER — Encounter: Payer: Self-pay | Admitting: Vascular Surgery

## 2022-06-21 ENCOUNTER — Emergency Department (HOSPITAL_BASED_OUTPATIENT_CLINIC_OR_DEPARTMENT_OTHER): Payer: Medicare Other | Admitting: Radiology

## 2022-06-21 ENCOUNTER — Ambulatory Visit (INDEPENDENT_AMBULATORY_CARE_PROVIDER_SITE_OTHER): Payer: Medicare Other | Admitting: Vascular Surgery

## 2022-06-21 ENCOUNTER — Encounter (HOSPITAL_BASED_OUTPATIENT_CLINIC_OR_DEPARTMENT_OTHER): Payer: Self-pay

## 2022-06-21 ENCOUNTER — Emergency Department (HOSPITAL_BASED_OUTPATIENT_CLINIC_OR_DEPARTMENT_OTHER)
Admission: EM | Admit: 2022-06-21 | Discharge: 2022-06-21 | Disposition: A | Discharge status: Home / Self Care | Payer: Medicare Other | Attending: Emergency Medicine | Admitting: Emergency Medicine

## 2022-06-21 VITALS — BP 116/80 | HR 83 | Temp 98.0°F | Resp 20 | Ht 71.0 in | Wt 174.5 lb

## 2022-06-21 DIAGNOSIS — Y9389 Activity, other specified: Secondary | ICD-10-CM | POA: Diagnosis not present

## 2022-06-21 DIAGNOSIS — W19XXXA Unspecified fall, initial encounter: Secondary | ICD-10-CM

## 2022-06-21 DIAGNOSIS — Z7982 Long term (current) use of aspirin: Secondary | ICD-10-CM | POA: Diagnosis not present

## 2022-06-21 DIAGNOSIS — M5136 Other intervertebral disc degeneration, lumbar region: Secondary | ICD-10-CM | POA: Insufficient documentation

## 2022-06-21 DIAGNOSIS — I2510 Atherosclerotic heart disease of native coronary artery without angina pectoris: Secondary | ICD-10-CM

## 2022-06-21 DIAGNOSIS — I890 Lymphedema, not elsewhere classified: Secondary | ICD-10-CM

## 2022-06-21 DIAGNOSIS — S32049A Unspecified fracture of fourth lumbar vertebra, initial encounter for closed fracture: Secondary | ICD-10-CM | POA: Insufficient documentation

## 2022-06-21 DIAGNOSIS — I739 Peripheral vascular disease, unspecified: Secondary | ICD-10-CM | POA: Diagnosis not present

## 2022-06-21 DIAGNOSIS — R42 Dizziness and giddiness: Secondary | ICD-10-CM | POA: Diagnosis not present

## 2022-06-21 DIAGNOSIS — M545 Low back pain, unspecified: Secondary | ICD-10-CM | POA: Diagnosis not present

## 2022-06-21 DIAGNOSIS — S5001XA Contusion of right elbow, initial encounter: Secondary | ICD-10-CM | POA: Diagnosis not present

## 2022-06-21 DIAGNOSIS — W1830XA Fall on same level, unspecified, initial encounter: Secondary | ICD-10-CM | POA: Diagnosis not present

## 2022-06-21 DIAGNOSIS — Z7902 Long term (current) use of antithrombotics/antiplatelets: Secondary | ICD-10-CM | POA: Diagnosis not present

## 2022-06-21 DIAGNOSIS — M25551 Pain in right hip: Secondary | ICD-10-CM | POA: Diagnosis not present

## 2022-06-21 DIAGNOSIS — S3992XA Unspecified injury of lower back, initial encounter: Secondary | ICD-10-CM | POA: Diagnosis present

## 2022-06-21 DIAGNOSIS — Y92003 Bedroom of unspecified non-institutional (private) residence as the place of occurrence of the external cause: Secondary | ICD-10-CM | POA: Diagnosis not present

## 2022-06-21 DIAGNOSIS — Z043 Encounter for examination and observation following other accident: Secondary | ICD-10-CM | POA: Diagnosis not present

## 2022-06-21 DIAGNOSIS — M5459 Other low back pain: Secondary | ICD-10-CM | POA: Diagnosis not present

## 2022-06-21 LAB — CBG MONITORING, ED: Glucose-Capillary: 119 mg/dL — ABNORMAL HIGH (ref 70–99)

## 2022-06-21 NOTE — Progress Notes (Signed)
ASSESSMENT & PLAN   LYMPHEDEMA LEFT LOWER EXTREMITY: I did reassure her that she has excellent arterial flow although she may have some calcium in her arteries there are no significant blockages.  No further arterial workup is indicated at this time.  I think her swelling in the left leg is secondary to lymphedema likely related to her previous abdominal surgery for uterine cancer with lymph node dissection.  We have discussed the importance of intermittent leg elevation and the proper positioning for this.  I encouraged her to consider a knee-high compression stocking with a gradient of 15 to 20 mmHg.  We discussed the importance of exercise specifically walking and water aerobics.  I encouraged her to avoid prolonged sitting and standing.  Fortunately she has a healthy weight.  If her swelling persisted or worsen we could consider a lymphedema pump however currently I do not think her swelling warrants that at this time.  I will see her back as needed.  REASON FOR CONSULT:    Peripheral arterial disease.  The consult is requested by [name], NP.  HPI:   Savannah [name] is a 66 y.o. female who had been sent for noninvasive arterial and venous studies in July.  Although she had normal waveforms on her arterial study it was noted that her arteries were calcified as the ABIs were falsely elevated.  For this reason she was sent for vascular consultation.  I do not get any history of claudication or rest pain.  She has no history of nonhealing wounds.  She denies any previous history of DVT.  She does have a history of swelling in the left leg for about 20 years.  Is been more significant on the left side.  She had surgery for uterine cancer and required a lymph node dissection.  This was in 1997.  She does occasionally wear compression stockings.  These helped some.  Past Medical History:  Diagnosis Date   ABDOMINAL PAIN, CHRONIC 10/20/2009   Acute cystitis 08/17/2009   ALLERGIC RHINITIS  11/21/2007   Allergy    ASYMPTOMATIC POSTMENOPAUSAL STATUS 09/29/2008   Bariatric surgery status 07/07/2010   Bariatric surgery status 07/07/2010   Qualifier: Diagnosis of  By: Loanne Drilling MD, Sean A    Cancer [facility])    uterine   Cataract    Coronary artery disease    DEPRESSION 11/21/2007   DIABETES MELLITUS, TYPE II 07/08/2007   DYSPNEA 05/20/2009   Edema 85/46/2703   Eosinophilic esophagitis 50/07/3817   FEVER UNSPECIFIED 10/20/2009   FEVER, HX OF 03/09/2010   GERD (gastroesophageal reflux disease)    Headache(784.0) 02/06/2010   Hepatomegaly 01/06/2008   HYPERLIPIDEMIA 07/08/2007   HYPERTENSION 07/08/2007   LEUKOPENIA, MILD 09/29/2008   OBESITY 01/06/2008   OSTEOARTHRITIS 01/06/2008   Other chronic nonalcoholic liver disease 29/93/7169   PERIPHERAL NEUROPATHY 11/21/2007   Peripheral neuropathy    Postsurgical hypothyroidism 09/19/2010   SINUSITIS- ACUTE-NOS 11/21/2007   TB SKIN TEST, POSITIVE 02/06/2010   THYROID NODULE 03/09/2010    Family History  Problem Relation Age of Onset   Diabetes Mother    Neuropathy Mother    Diabetes Father    Congestive Heart Failure Father    Hypertension Father    Myelodysplastic syndrome Father    Bladder Cancer Sister    Multiple sclerosis Sister    Breast cancer Sister        breast onset age 21   Osteoporosis Sister    Liver cancer Maternal Aunt  Breast cancer Maternal Aunt    Diabetes Maternal Aunt    Colon cancer Maternal Uncle    Diabetes Maternal Uncle    Neuropathy Paternal Aunt    Diabetes Maternal Grandmother    Diabetes Paternal Grandmother     SOCIAL HISTORY: Social History   Tobacco Use   Smoking status: Former    Packs/day: 1.50    Years: 15.00    Total pack years: 22.50    Types: Cigarettes    Quit date: 1992    Years since quitting: 31.6   Smokeless tobacco: Never  Substance Use Topics   Alcohol use: Yes    Comment: rarely    Allergies  Allergen Reactions   Ace Inhibitors Cough   Caffeine      PVCs   Ciprofloxacin Itching   Morphine And Related Itching    Pt tolerates medication if given with diphenhydramine   Mucinex [Guaifenesin Er]     PVCs   Nsaids     Gastric Bypass Surgery - unable to take, tolerates ec 81 aspirin    Other     Antihistamine-alkylamine - PVCs tolerates benadryl    Silicone Hives   Tape Hives    Current Outpatient Medications  Medication Sig Dispense Refill   acetaminophen (TYLENOL) 500 MG tablet Take 2 tablets (1,000 mg total) by mouth every 6 (six) hours as needed for mild pain or fever. 30 tablet 0   AMBULATORY NON FORMULARY MEDICATION Administrator at [employer]  Correct leg length and improve pronation BL 1 each 0   aspirin EC 81 MG tablet TAKE 1 TABLET (81 MG TOTAL) BY MOUTH DAILY. SWALLOW WHOLE. 30 tablet 3   atorvastatin (LIPITOR) 40 MG tablet Take 1 tablet (40 mg total) by mouth daily. (Patient taking differently: Take 40 mg by mouth every evening.) 90 tablet 3   canagliflozin (INVOKANA) 300 MG TABS tablet Take 150 mg by mouth daily before breakfast.     Cholecalciferol (VITAMIN D) 50 MCG (2000 UT) tablet Take 4,000 Units by mouth in the morning.     CLINPRO 5000 1.1 % PSTE Place 1 Application onto teeth at bedtime.     clopidogrel (PLAVIX) 75 MG tablet Take 1 tablet (75 mg total) by mouth daily. 30 tablet 6   colchicine 0.6 MG tablet TAKE 1 TABLET BY MOUTH EVERY DAY 90 tablet 0   cyclobenzaprine (FLEXERIL) 10 MG tablet Take 10 mg by mouth at bedtime as needed for muscle spasms.     diclofenac sodium (VOLTAREN) 1 % GEL Apply 4 g topically 4 (four) times daily. 100 g 11   Dulaglutide (TRULICITY) 4.5 EL/3.8BO SOPN Inject 4.5 mg as directed once a week. (Patient taking differently: Inject 4.5 mg as directed every Tuesday at 6 PM.) 6 mL 3   fluconazole (DIFLUCAN) 200 MG tablet Take 200 mg by mouth once a week.     furosemide (LASIX) 20 MG tablet Take 1 tablet (20 mg total) by mouth as needed. 30 tablet 2   gabapentin (NEURONTIN) 600  MG tablet TAKE 2 TABLETS BY MOUTH 3 TIMES DAILY. 180 tablet 1   Halobetasol & Lactic Acid (ULTRAVATE X, OINTMENT, EX) Apply 1 Application topically 2 (two) times daily.     levalbuterol (XOPENEX HFA) 45 MCG/ACT inhaler Inhale 1 puff into the lungs every 4 (four) hours as needed for wheezing. 1 each 12   levothyroxine (SYNTHROID) 112 MCG tablet TAKE 1 TABLET BY MOUTH EVERY DAY 90 tablet 2   lidocaine (LIDODERM) 5 % PLACE  1 PATCH ONTO THE SKIN DAILY. REMOVE & DISCARD PATCH WITHIN 12 HOURS OR AS DIRECTED BY MD 30 patch 0   loratadine (CLARITIN) 10 MG tablet Take 10 mg by mouth in the morning.     MAGNESIUM PO Take 3 tablets by mouth daily as needed (foot cramps).     metFORMIN (GLUCOPHAGE-XR) 500 MG 24 hr tablet Take 4 tablets (2,000 mg total) by mouth daily. (Patient taking differently: Take 1,000 mg by mouth in the morning and at bedtime.) 360 tablet 3   Multiple Vitamin (MULTIVITAMIN) tablet Take 1 tablet by mouth in the morning and at bedtime.     nitroGLYCERIN (NITROSTAT) 0.4 MG SL tablet Place 1 tablet (0.4 mg total) under the tongue every 5 (five) minutes as needed. 25 tablet 3   ONETOUCH ULTRA test strip USE TO MONITOR GLUCOSE LEVELS ONCE PER DAY E11.40 100 strip 2   OxyCODONE HCl, Abuse Deter, (OXAYDO) 5 MG TABA Take 5 mg by mouth daily as needed (severe pain.).     pantoprazole (PROTONIX) 40 MG tablet Take 40 mg by mouth 2 (two) times daily.     repaglinide (PRANDIN) 2 MG tablet Take 1 tablet (2 mg total) by mouth 3 (three) times daily before meals. 270 tablet 3   traMADol (ULTRAM) 50 MG tablet Take 50 mg by mouth every 6 (six) hours as needed (pain).     No current facility-administered medications for this visit.    REVIEW OF SYSTEMS:  [X]  denotes positive finding, [ ]  denotes negative finding Cardiac  Comments:  Chest pain or chest pressure: x   Shortness of breath upon exertion: x   Short of breath when lying flat: x   Irregular heart rhythm: x       Vascular    Pain in calf,  thigh, or hip brought on by ambulation:    Pain in feet at night that wakes you up from your sleep:  x   Blood clot in your veins:    Leg swelling:  x       Pulmonary    Oxygen at home:    Productive cough:     Wheezing:         Neurologic    Sudden weakness in arms or legs:     Sudden numbness in arms or legs:     Sudden onset of difficulty speaking or slurred speech:    Temporary loss of vision in one eye:     Problems with dizziness:         Gastrointestinal    Blood in stool:     Vomited blood:         Genitourinary    Burning when urinating:     Blood in urine:        Psychiatric    Major depression:         Hematologic    Bleeding problems:    Problems with blood clotting too easily:        Skin    Rashes or ulcers:        Constitutional    Fever or chills:    -  PHYSICAL EXAM:   Vitals:   06/21/22 0848  BP: 116/80  Pulse: 83  Resp: 20  Temp: 98 F (36.7 C)  SpO2: 96%  Weight: 174 lb 8 oz (79.2 kg)  Height: 5' 11"  (1.803 m)   Body mass index is 24.34 kg/m. GENERAL: The patient is a well-nourished female, in no acute distress. The  vital signs are documented above. CARDIAC: There is a regular rate and rhythm.  VASCULAR: I do not detect carotid bruits. On the right side she has a palpable femoral, popliteal, and dorsalis pedis pulse.  She has a biphasic Doppler signal in the posterior tibial artery. On the left side she has a palpable femoral and popliteal pulse.  I cannot palpate pedal pulses because of her leg swelling.  She does have a biphasic dorsalis pedis and posterior tibial signal with the Doppler. She has swelling of the left leg pretty much from the knee down with swelling also on the dorsum of the foot.  I think this pattern of swelling is consistent with lymphedema.  She does not have any significant hyperpigmentation to suggest chronic venous insufficiency. PULMONARY: There is good air exchange bilaterally without wheezing or  rales. ABDOMEN: Soft and non-tender with normal pitched bowel sounds.  MUSCULOSKELETAL: There are no major deformities. NEUROLOGIC: No focal weakness or paresthesias are detected. SKIN: There are no ulcers or rashes noted. PSYCHIATRIC: The patient has a normal affect.  DATA:    ARTERIAL DUPLEX: I have reviewed the arterial duplex scan that was done on 06/06/2022.  This showed that the ABIs were markedly elevated 1.59 on the right and 1.68 on the left.  This is likely secondary to calcific vessels.  She did have multiphasic flow throughout both lower extremities.  VENOUS DUPLEX: I also looked at the venous duplex scan that was done on 06/04/2022.  There is no evidence of DVT on either lower extremity.  Deitra Mayo [facility]

## 2022-06-21 NOTE — ED Notes (Signed)
CBG results given to [place]

## 2022-06-21 NOTE — ED Triage Notes (Signed)
Patient here POV from Home.  Endorses falling Yesterday PM. Patient bent forwards to her Dresser and stumbled Backwards and fell onto Buttocks. Endorses Pain to Right Hip and Lumbar Area.   No Head Injury. No LOC. Takes Plavix.   NAD Noted during Triage. A&Ox4. GCS 15. Ambulatory.

## 2022-06-21 NOTE — Discharge Instructions (Signed)
Your x-ray on today's visit did not show any acute findings.  We did discuss obtaining a repeat x-ray in about a week if you feel that pain has not completely resolved.  You may take the muscle relaxers and pain medication that you do have at home.  If you experience any worsening symptoms you can also return to the emergency department.

## 2022-06-21 NOTE — ED Provider Notes (Signed)
[facility] EMERGENCY DEPT Provider Note   CSN: [id number] Arrival date & time: 06/21/22  1249     History  Chief Complaint  Patient presents with   Savannah [name] is a 66 y.o. female.  66 year old female with a past medical history of vasculitis presents to the ED status post fall yesterday.  Patient reports she was in her bedroom when suddenly she tried to take a step and felt somewhat dizzy.  Patient reports she has had ongoing dizziness for the past couple of weeks.  She recently had her blood pressure adjusted by cardiology, states that she discontinue amlodipine as it was making her blood pressure certainly low.  On today's visit she is endorsing pain along the right hip, lumbar spine and right elbow.  She reports most of the impact was observed by her buttocks.  She does have a prior history of a lumbar spine fracture.  She has been applying lidocaine patches, and taking anti-inflammatories without much improvement in symptoms.  She is currently on Plavix.  Denies striking the head, did not lose any consciousness. No headache, no nausea, no unsteady gate reported.   The history is provided by the patient and medical records.  Fall This is a new problem. The current episode started yesterday. Pertinent negatives include no chest pain, no headaches and no shortness of breath.       Home Medications Prior to Admission medications   Medication Sig Start Date End Date Taking? Authorizing Provider  acetaminophen (TYLENOL) 500 MG tablet Take 2 tablets (1,000 mg total) by mouth every 6 (six) hours as needed for mild pain or fever. 05/13/21   Rai, Vernelle Emerald, MD  AMBULATORY NON FORMULARY MEDICATION Administrator at [employer]  Correct leg length and improve pronation BL 10/03/20   Gregor Hams, MD  aspirin EC 81 MG tablet TAKE 1 TABLET (81 MG TOTAL) BY MOUTH DAILY. SWALLOW WHOLE. 06/04/22   Elouise Munroe, MD  atorvastatin (LIPITOR) 40 MG tablet Take 1  tablet (40 mg total) by mouth daily. Patient taking differently: Take 40 mg by mouth every evening. 10/09/21 05/02/24  Warren Lacy, PA-C  canagliflozin (INVOKANA) 300 MG TABS tablet Take 150 mg by mouth daily before breakfast.    [provider]  Cholecalciferol (VITAMIN D) 50 MCG (2000 UT) tablet Take 4,000 Units by mouth in the morning.    [provider]  CLINPRO 5000 1.1 % PSTE Place 1 Application onto teeth at bedtime. 05/02/21   [provider]  clopidogrel (PLAVIX) 75 MG tablet Take 1 tablet (75 mg total) by mouth daily. 05/03/22   Early Osmond, MD  colchicine 0.6 MG tablet TAKE 1 TABLET BY MOUTH EVERY DAY 04/02/22   Ofilia Neas, PA-C  cyclobenzaprine (FLEXERIL) 10 MG tablet Take 10 mg by mouth at bedtime as needed for muscle spasms. 07/06/20   [provider]  diclofenac sodium (VOLTAREN) 1 % GEL Apply 4 g topically 4 (four) times daily. 09/06/17   Renato Shin, MD  Dulaglutide (TRULICITY) 4.5 JQ/3.0SP SOPN Inject 4.5 mg as directed once a week. Patient taking differently: Inject 4.5 mg as directed every Tuesday at 6 PM. 10/16/21   Renato Shin, MD  fluconazole (DIFLUCAN) 200 MG tablet Take 200 mg by mouth once a week.    [provider]  furosemide (LASIX) 20 MG tablet Take 1 tablet (20 mg total) by mouth as needed. 05/10/22   Loel Dubonnet, NP  gabapentin (  NEURONTIN) 600 MG tablet TAKE 2 TABLETS BY MOUTH 3 TIMES DAILY. 02/05/22   Vivi Barrack, MD  Halobetasol & Lactic Acid (ULTRAVATE X, OINTMENT, EX) Apply 1 Application topically 2 (two) times daily.    [provider]  levalbuterol Penne Lash HFA) 45 MCG/ACT inhaler Inhale 1 puff into the lungs every 4 (four) hours as needed for wheezing. 05/10/22   Loel Dubonnet, NP  levothyroxine (SYNTHROID) 112 MCG tablet TAKE 1 TABLET BY MOUTH EVERY DAY 11/14/21   Renato Shin, MD  lidocaine (LIDODERM) 5 % PLACE 1 PATCH ONTO THE SKIN DAILY. REMOVE & DISCARD PATCH WITHIN 12 HOURS OR  AS DIRECTED BY MD 06/11/22   Ofilia Neas, PA-C  loratadine (CLARITIN) 10 MG tablet Take 10 mg by mouth in the morning.    [provider]  MAGNESIUM PO Take 3 tablets by mouth daily as needed (foot cramps).    [provider]  metFORMIN (GLUCOPHAGE-XR) 500 MG 24 hr tablet Take 4 tablets (2,000 mg total) by mouth daily. Patient taking differently: Take 1,000 mg by mouth in the morning and at bedtime. 05/24/21   Renato Shin, MD  Multiple Vitamin (MULTIVITAMIN) tablet Take 1 tablet by mouth in the morning and at bedtime.    [provider]  nitroGLYCERIN (NITROSTAT) 0.4 MG SL tablet Place 1 tablet (0.4 mg total) under the tongue every 5 (five) minutes as needed. 05/10/22   Loel Dubonnet, NP  ONETOUCH ULTRA test strip USE TO MONITOR GLUCOSE LEVELS ONCE PER DAY E11.40 04/09/21   Renato Shin, MD  OxyCODONE HCl, Abuse Deter, (OXAYDO) 5 MG TABA Take 5 mg by mouth daily as needed (severe pain.).    [provider]  pantoprazole (PROTONIX) 40 MG tablet Take 40 mg by mouth 2 (two) times daily.    [provider]  repaglinide (PRANDIN) 2 MG tablet Take 1 tablet (2 mg total) by mouth 3 (three) times daily before meals. 10/16/21   Renato Shin, MD  traMADol (ULTRAM) 50 MG tablet Take 50 mg by mouth every 6 (six) hours as needed (pain).    [provider]      Allergies    Ace inhibitors, Caffeine, Ciprofloxacin, Morphine and related, Mucinex [guaifenesin er], Nsaids, Other, Silicone, and Tape    Review of Systems   Review of Systems  Constitutional:  Negative for fever.  Respiratory:  Negative for shortness of breath.   Cardiovascular:  Negative for chest pain.  Genitourinary:  Negative for flank pain.  Musculoskeletal:  Negative for back pain.  Neurological:  Positive for weakness. Negative for light-headedness and headaches.  Hematological:  Bruises/bleeds easily.  All other systems reviewed and are negative.   Physical Exam Updated Vital  Signs BP (!) 144/69 (BP Location: Left Arm)   Pulse 70   Temp 97.7 F (36.5 C) (Oral)   Resp 16   Ht 5' 11"  (1.803 m)   Wt 79.2 kg   SpO2 98%   BMI 24.35 kg/m  Physical Exam Vitals and nursing note reviewed.  Constitutional:      Appearance: Normal appearance.  HENT:     Head: Normocephalic and atraumatic.  Eyes:     Pupils: Pupils are equal, round, and reactive to light.  Cardiovascular:     Rate and Rhythm: Normal rate.  Pulmonary:     Effort: Pulmonary effort is normal.  Abdominal:     General: Abdomen is flat.     Tenderness: There is no abdominal tenderness.  Musculoskeletal:  Cervical back: Normal range of motion and neck supple.     Lumbar back: Signs of trauma, spasms and tenderness present. No bony tenderness.       Back:     Comments: RLE- KF,KE 5/5 strength LLE- HF, HE 5/5 strength Normal gait. No pronator drift. No leg drop.  CN I, II and VIII not tested. CN II-XII grossly intact bilaterally.      Skin:    General: Skin is warm and dry.     Findings: Bruising present.          Comments: Right elbow bruising noted, extensive. No open wounds.   Neurological:     Mental Status: She is alert and oriented to person, place, and time.     ED Results / Procedures / Treatments   Labs (all labs ordered are listed, but only abnormal results are displayed) Labs Reviewed  CBG MONITORING, ED - Abnormal; Notable for the following components:      Result Value   Glucose-Capillary 119 (*)    All other components within normal limits    EKG None  Radiology DG Hip Unilat  With Pelvis 2-3 Views Right  Result Date: 06/21/2022 CLINICAL DATA:  Fall EXAM: DG HIP (WITH OR WITHOUT PELVIS) 2-3V RIGHT COMPARISON:  05/04/2020 FINDINGS: Bones appear mildly demineralized. There is no evidence of hip fracture or dislocation. There is no evidence of arthropathy or other focal bone abnormality. IMPRESSION: Negative. Electronically Signed   By: Davina Poke D.O.   On:  06/21/2022 13:27   DG Lumbar Spine 2-3 Views  Result Date: 06/21/2022 CLINICAL DATA:  Fall.  Back pain EXAM: LUMBAR SPINE - 2-3 VIEW COMPARISON:  Lumbar spine 04/18/2021 FINDINGS: Five lumbar vertebra. Normal sagittal alignment. Mild levoscoliosis. Negative for acute fracture. Mild superior endplate fracture of L4 is unchanged from the prior study. Mild disc degeneration in the lumbar spine most prominent L4-5 and L5-S1 Prior gastric surgery. Clips in the right upper quadrant and in the pelvis. IMPRESSION: Negative for acute fracture.  Mild chronic fracture L4 Electronically Signed   By: Franchot Gallo M.D.   On: 06/21/2022 13:27    Procedures Procedures    Medications Ordered in ED Medications - No data to display  ED Course/ Medical Decision Making/ A&P                           Medical Decision Making Amount and/or Complexity of Data Reviewed Radiology: ordered.   Patient presents to the ED status post fall.  States that she was ambulating yesterday in her bedroom when suddenly she fell due to feeling dizzy.  She has had ongoing dizziness and was seen recently by cardiology along with vascular today.  She does report changes in blood pressure medication.  She landed mostly on her buttocks, she was concerned because she is currently on Plavix and also has a prior history of a lumbar spine fracture.  She arrived via POV after her vascular appointment.  Her vitals are within normal limits.  During her exam she was ambulated in the room with a steady gait, although she does get positional dizziness at times.  Pain along the lumbar spine reproducible with palpation, has good strength to bilateral upper extremities.  Noted to have abrasions to bilateral upper extremities but is overall in stable condition.  X-ray of the lumbar spine did show a prior L4 chronic fracture which she is aware of.  X-ray of the right hip  without any acute findings.  These were discussed with patient.  She does have a  prescription for tramadol at home along with some Flexeril.  She reports she does not like to take medication as it makes her feel very unsteady and she is out running errands.  I discussed RICE therapy with patient, discussed antibiotic treatment with pain control.  She reports she did not strike her head, did not have any nausea, no vomiting no loss of consciousness.  Discussed obtaining CT however patient reports she did not strike her head.  Vitals are within normal limits.  We did discuss repeat x-ray in about a week to rule out any occult fracture.  She is agreeable to plan and treatment, patient stable for discharge.    Portions of this note were generated with [employer]. Dictation errors may occur despite best attempts at proofreading.   Final Clinical Impression(s) / ED Diagnoses Final diagnoses:  Fall, initial encounter  Acute bilateral low back pain without sciatica    Rx / DC Orders ED Discharge Orders     None         Janeece Fitting, PA-C 06/21/22 1405    Wyvonnia Dusky, MD 06/22/22 [id number]

## 2022-06-26 ENCOUNTER — Ambulatory Visit (INDEPENDENT_AMBULATORY_CARE_PROVIDER_SITE_OTHER): Payer: Medicare Other | Admitting: Internal Medicine

## 2022-06-26 DIAGNOSIS — R0602 Shortness of breath: Secondary | ICD-10-CM

## 2022-06-26 LAB — PULMONARY FUNCTION TEST
DL/VA % pred: 107 %
DL/VA: 4.23 ml/min/mmHg/L
DLCO cor % pred: 88 %
DLCO cor: 21.78 ml/min/mmHg
DLCO unc % pred: 88 %
DLCO unc: 21.78 ml/min/mmHg
FEF 25-75 Post: 1.99 L/sec
FEF 25-75 Pre: 1.89 L/sec
FEF2575-%Change-Post: 5 %
FEF2575-%Pred-Post: 82 %
FEF2575-%Pred-Pre: 78 %
FEV1-%Change-Post: 2 %
FEV1-%Pred-Post: 88 %
FEV1-%Pred-Pre: 86 %
FEV1-Post: 2.69 L
FEV1-Pre: 2.61 L
FEV1FVC-%Change-Post: 0 %
FEV1FVC-%Pred-Pre: 93 %
FEV6-%Change-Post: 0 %
FEV6-%Pred-Post: 96 %
FEV6-%Pred-Pre: 95 %
FEV6-Post: 3.67 L
FEV6-Pre: 3.65 L
FEV6FVC-%Change-Post: -1 %
FEV6FVC-%Pred-Post: 102 %
FEV6FVC-%Pred-Pre: 103 %
FVC-%Change-Post: 2 %
FVC-%Pred-Post: 94 %
FVC-%Pred-Pre: 92 %
FVC-Post: 3.74 L
FVC-Pre: 3.66 L
Post FEV1/FVC ratio: 72 %
Post FEV6/FVC ratio: 98 %
Pre FEV1/FVC ratio: 71 %
Pre FEV6/FVC Ratio: 100 %
RV % pred: 84 %
RV: 2.11 L
TLC % pred: 95 %
TLC: 5.81 L

## 2022-06-26 NOTE — Progress Notes (Signed)
[facility] PREP Weekly Session  Patient Details  Name: Savannah [name] MRN: [id number] Date of Birth: 01-13-1956 Age: 66 y.o. PCP: Vivi Barrack, MD  Vitals:   06/26/22 1321  Weight: 172 lb (78 kg)     [facility] Weekly seesion - 06/26/22 1300       [facility] "PREP" Location   [facility] "PREP" Location [name]      Weekly Session   Topic Discussed Importance of resistance training;Other ways to be active   Goal: 150 min/cardio each wk; strength training 2-3 times a wk for 20-40 minutes   Classes attended to date [place] 06/26/2022, 1:22 PM

## 2022-06-26 NOTE — Progress Notes (Signed)
PFT done today. 

## 2022-06-27 DIAGNOSIS — M791 Myalgia, unspecified site: Secondary | ICD-10-CM | POA: Diagnosis not present

## 2022-06-28 IMAGING — CT CT CHEST-ABD-PELV W/ CM
2 of 5 series · 12 of 36 positions shown, 14 images · IV contrast (omnipaque)
Comparison: CT abdomen pelvis dated 07/09/2019.
COMPARISON: CT abdomen pelvis dated 07/09/2019.

Addendum:
CLINICAL DATA: 64-year-old female with blunt trauma.

EXAM:
CT CHEST, ABDOMEN, AND PELVIS WITH CONTRAST
TECHNIQUE: Multidetector CT imaging of the chest, abdomen and pelvis was
performed following the standard protocol during bolus
administration of intravenous contrast.
CONTRAST:  100mL OMNIPAQUE IOHEXOL 300 MG/ML  SOLN

[Series 3: cap with · axial · 0.83mm/px · z∈[-800,-230]mm · 9 of 140 slices shown, 11 images]
[im 13/140  mediastinal]
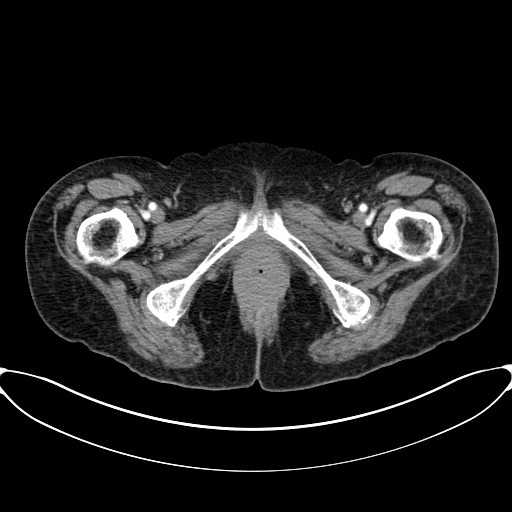
[im 13/140  bone]
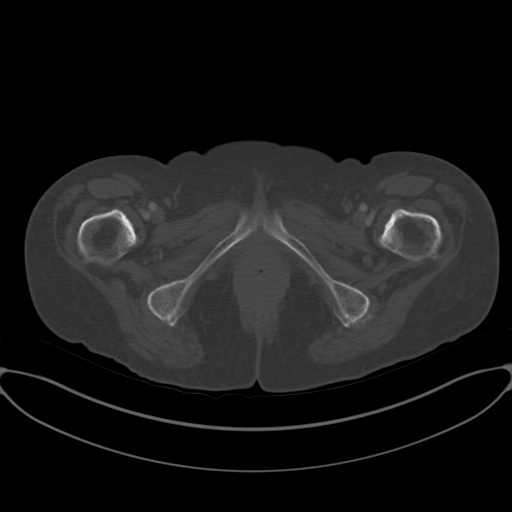
[im 26/140  mediastinal]
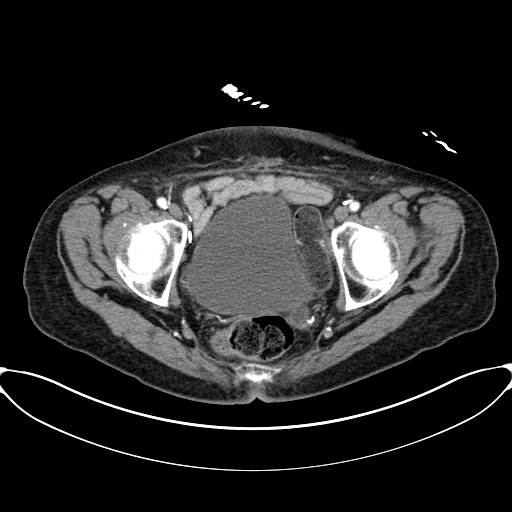
[im 38/140  mediastinal]
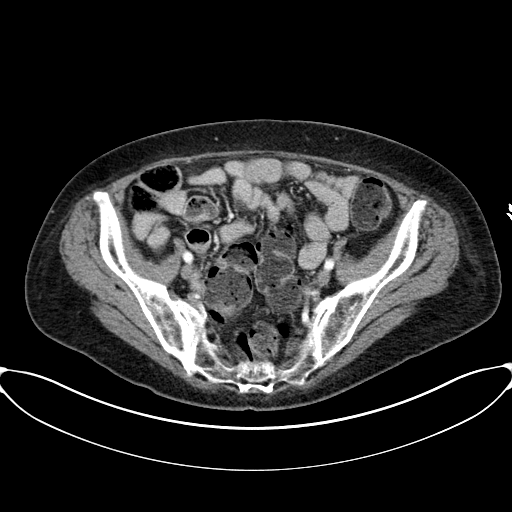
[im 51/140  mediastinal]
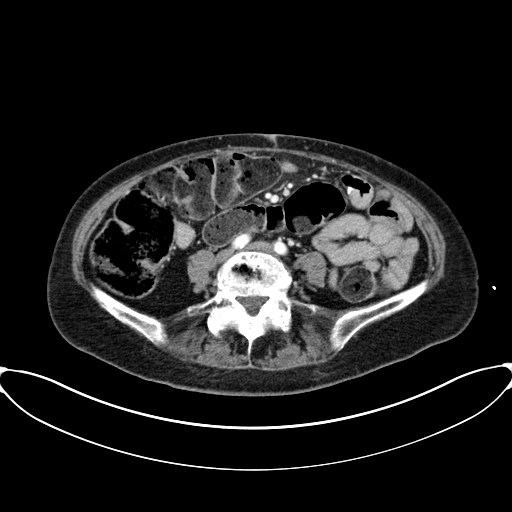
[im 76/140  mediastinal]
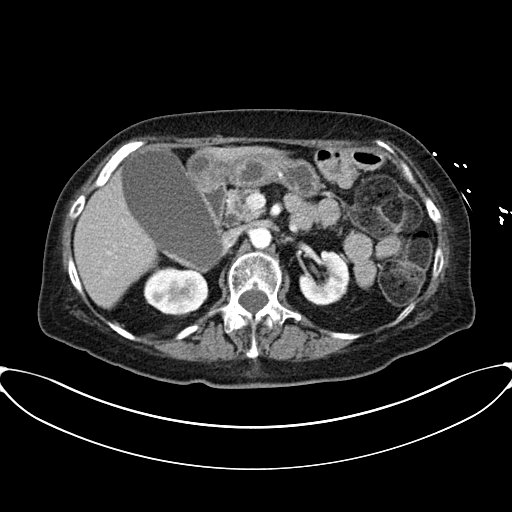
[im 89/140  mediastinal]
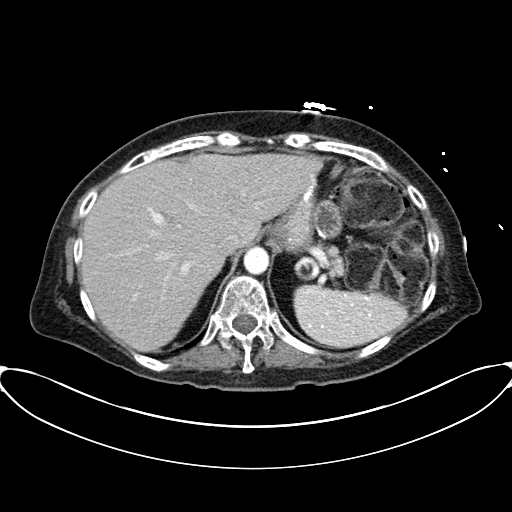
[im 102/140  mediastinal]
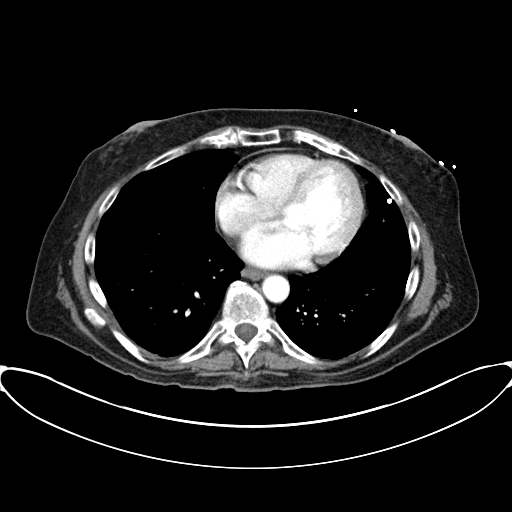
[im 114/140  mediastinal]
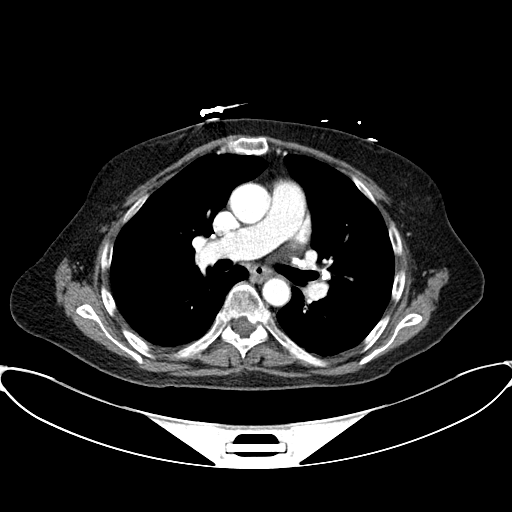
[im 127/140  mediastinal]
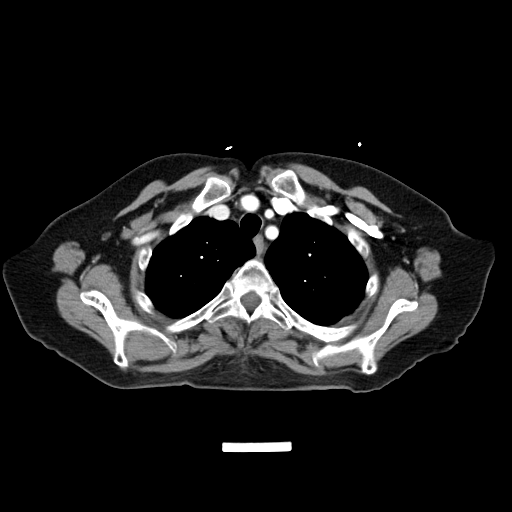
[im 127/140  bone]
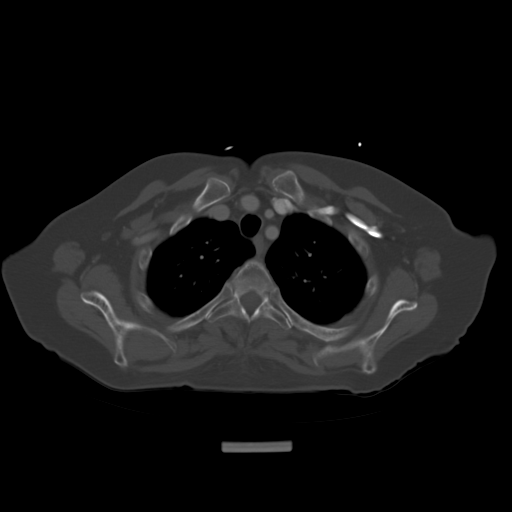

[Series 4: coronals · coronal · 0.78mm/px · 3 of 126 slices shown]
[im 26/126  mediastinal]
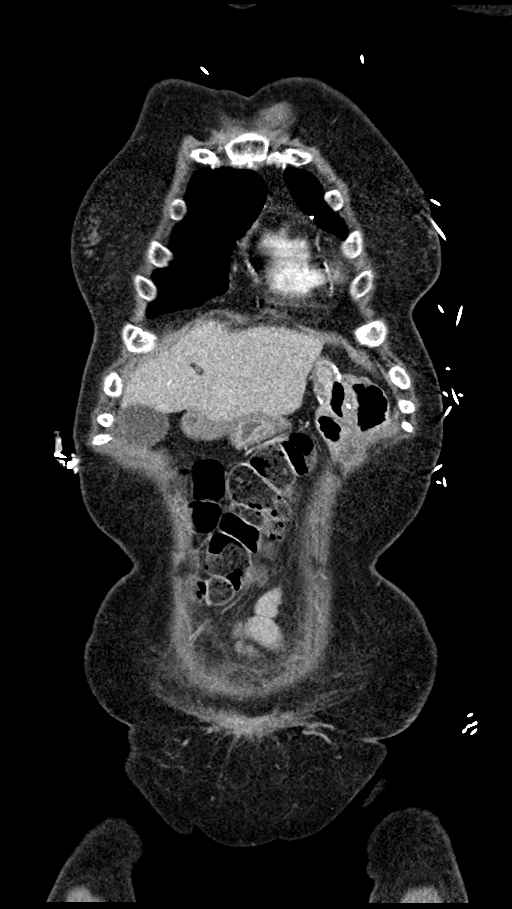
[im 51/126  mediastinal]
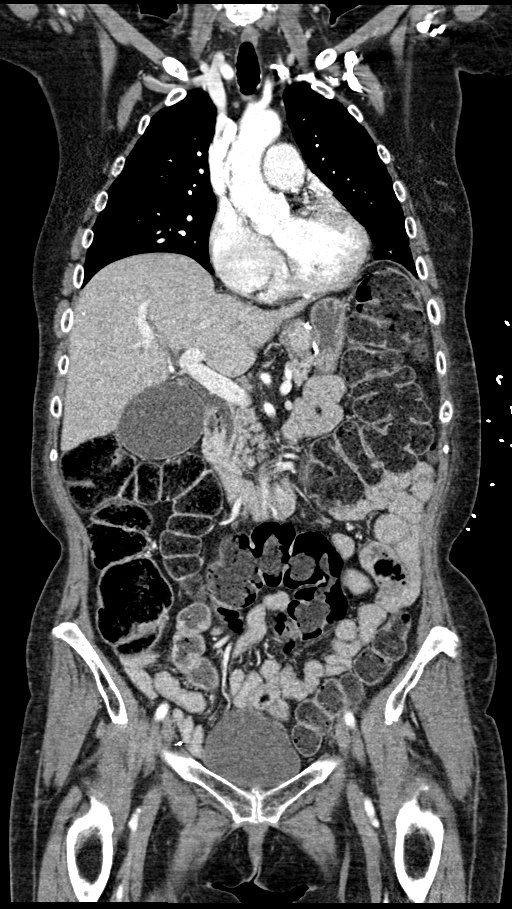
[im 76/126  mediastinal]
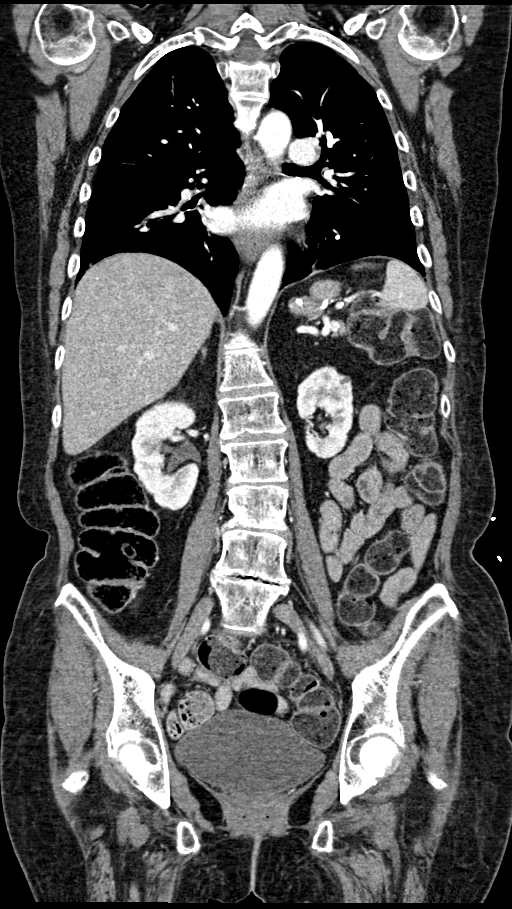

[12 of 36 positions shown; findings below may reference images not displayed]

FINDINGS: CT CHEST FINDINGS

Cardiovascular: There is no cardiomegaly or pericardial effusion.
There is a 3 vessel coronary vascular calcification. Mild
atherosclerotic calcification of the thoracic aorta. No aneurysmal
dilatation or dissection. The origins of the great vessels of the
aortic arch appear patent as visualized. The central pulmonary
arteries are patent.

Mediastinum/Nodes: Calcified hilar and mediastinal granuloma. The
esophagus is grossly unremarkable. Thyroidectomy. No mediastinal
fluid collection.

Lungs/Pleura: Minimal bibasilar atelectasis/scarring. Small
scattered calcified granuloma. No focal consolidation, pleural
effusion, pneumothorax. A 2 cm right apical pneumatocele. The
central airways are patent.

Musculoskeletal: There is a displaced fracture of the posterior
right seventh rib.

CT ABDOMEN PELVIS FINDINGS

No intra-abdominal free air or free fluid.

Hepatobiliary: The liver is unremarkable. No intrahepatic biliary
dilatation. The gallbladder is distended. Layering small stones
noted within the gallbladder. No pericholecystic fluid or evidence
of acute cholecystitis by CT.

Pancreas: Unremarkable. No pancreatic ductal dilatation or
surrounding inflammatory changes.

Spleen: Small calcified granuloma.

Adrenals/Urinary Tract: The adrenal glands unremarkable. Mild
bilateral renal parenchyma atrophy, left greater right. There is no
hydronephrosis on either side. 1 cm right renal cyst. The visualized
ureters and urinary bladder appear unremarkable.

Stomach/Bowel: There is large amount of stool throughout the colon.
There is no bowel obstruction or active inflammation. Postsurgical
changes of gastric bypass. The appendix is not visualized with
certainty. No inflammatory changes identified in the right lower
quadrant.

Vascular/Lymphatic: Mild aortoiliac atherosclerotic disease. The IVC
is unremarkable. No portal venous gas. There is no adenopathy.

Reproductive: Hysterectomy.  No adnexal masses.

Other: None

Musculoskeletal: Osteopenia with degenerative changes of the spine.
No acute osseous pathology.
IMPRESSION: 1. Displaced fracture of the posterior right seventh rib. No
pneumothorax.
2. No acute/traumatic intra-abdominal or pelvic pathology.
3. Cholelithiasis.
4. Aortic Atherosclerosis (AS2M8-VID.D).

ADDENDUM:
Please note there are also fractures of the lateral left
fourth-seventh ribs.

*** End of Addendum ***
FINDINGS: CT CHEST FINDINGS

Cardiovascular: There is no cardiomegaly or pericardial effusion.
There is a 3 vessel coronary vascular calcification. Mild
atherosclerotic calcification of the thoracic aorta. No aneurysmal
dilatation or dissection. The origins of the great vessels of the
aortic arch appear patent as visualized. The central pulmonary
arteries are patent.

Mediastinum/Nodes: Calcified hilar and mediastinal granuloma. The
esophagus is grossly unremarkable. Thyroidectomy. No mediastinal
fluid collection.

Lungs/Pleura: Minimal bibasilar atelectasis/scarring. Small
scattered calcified granuloma. No focal consolidation, pleural
effusion, pneumothorax. A 2 cm right apical pneumatocele. The
central airways are patent.

Musculoskeletal: There is a displaced fracture of the posterior
right seventh rib.

CT ABDOMEN PELVIS FINDINGS

No intra-abdominal free air or free fluid.

Hepatobiliary: The liver is unremarkable. No intrahepatic biliary
dilatation. The gallbladder is distended. Layering small stones
noted within the gallbladder. No pericholecystic fluid or evidence
of acute cholecystitis by CT.

Pancreas: Unremarkable. No pancreatic ductal dilatation or
surrounding inflammatory changes.

Spleen: Small calcified granuloma.

Adrenals/Urinary Tract: The adrenal glands unremarkable. Mild
bilateral renal parenchyma atrophy, left greater right. There is no
hydronephrosis on either side. 1 cm right renal cyst. The visualized
ureters and urinary bladder appear unremarkable.

Stomach/Bowel: There is large amount of stool throughout the colon.
There is no bowel obstruction or active inflammation. Postsurgical
changes of gastric bypass. The appendix is not visualized with
certainty. No inflammatory changes identified in the right lower
quadrant.

Vascular/Lymphatic: Mild aortoiliac atherosclerotic disease. The IVC
is unremarkable. No portal venous gas. There is no adenopathy.

Reproductive: Hysterectomy.  No adnexal masses.

Other: None

Musculoskeletal: Osteopenia with degenerative changes of the spine.
No acute osseous pathology.
IMPRESSION: 1. Displaced fracture of the posterior right seventh rib. No
pneumothorax.
2. No acute/traumatic intra-abdominal or pelvic pathology.
3. Cholelithiasis.
4. Aortic Atherosclerosis (AS2M8-VID.D).

## 2022-06-28 IMAGING — CT CT HEAD W/O CM
3 series · 16 of 47 positions shown, 19 images · non-contrast
Comparison: Head CT dated 04/24/2010.

CLINICAL DATA: 64-year-old female with head trauma.

EXAM:
CT HEAD WITHOUT CONTRAST
TECHNIQUE: Contiguous axial images were obtained from the base of the skull
through the vertex without intravenous contrast.

[Series 2: head wo · axial · 0.45mm/px · z∈[-57,+88]mm · 10 of 35 slices shown, 13 images]
[im 3/35  brain]
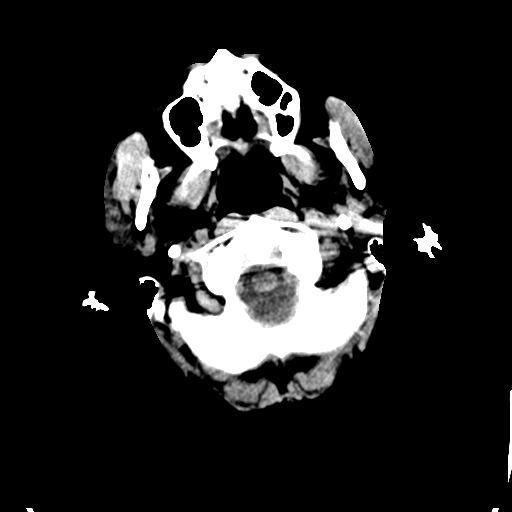
[im 3/35  bone]
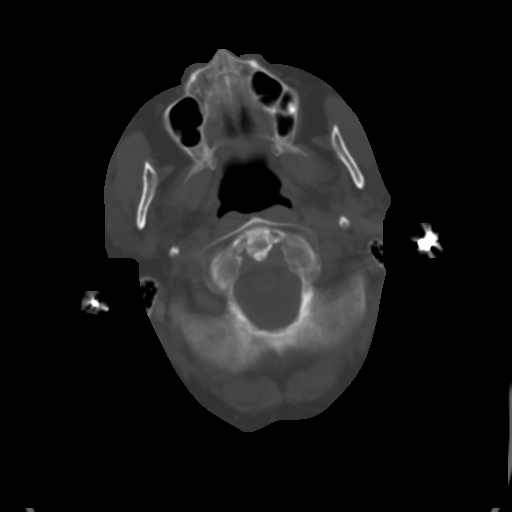
[im 6/35  brain]
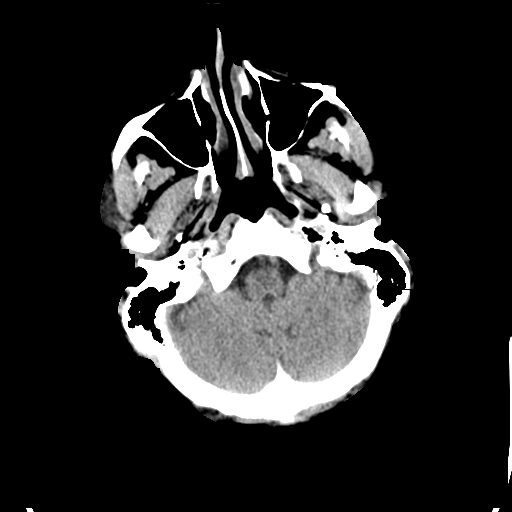
[im 10/35  brain]
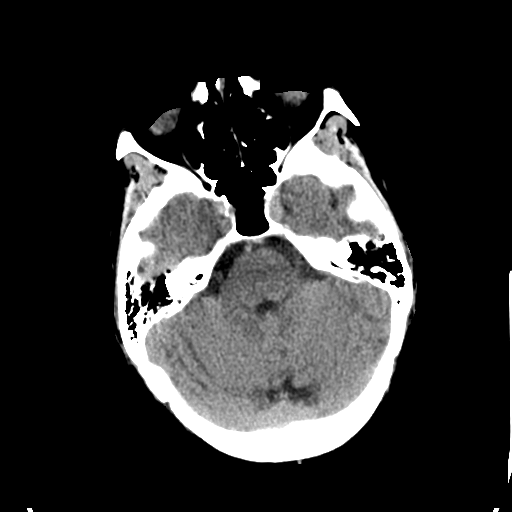
[im 12/35  brain]
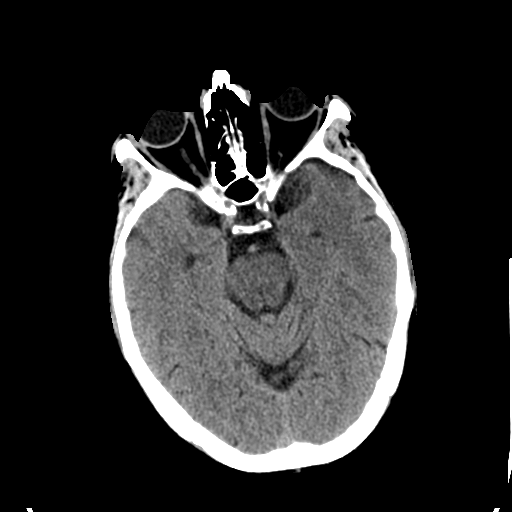
[im 16/35  brain]
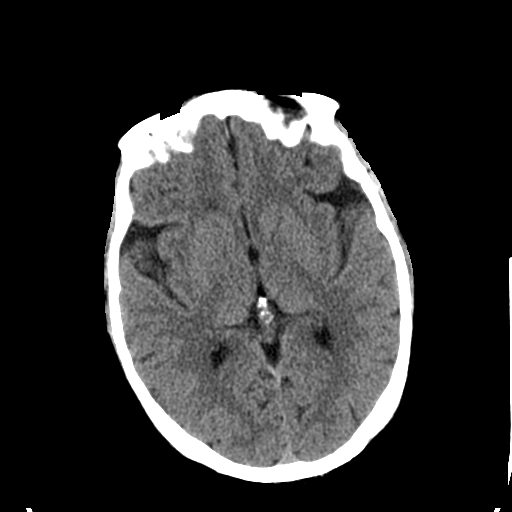
[im 16/35  bone]
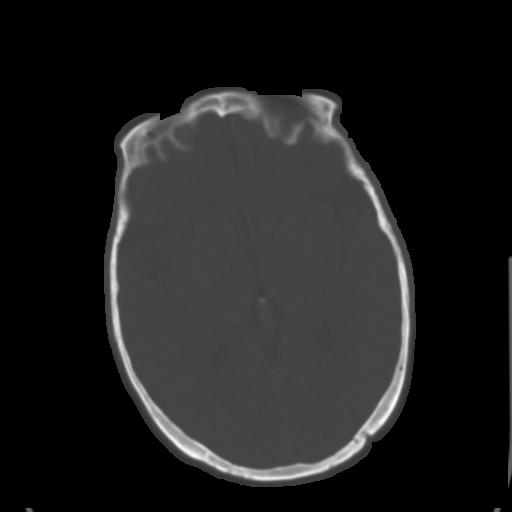
[im 19/35  brain]
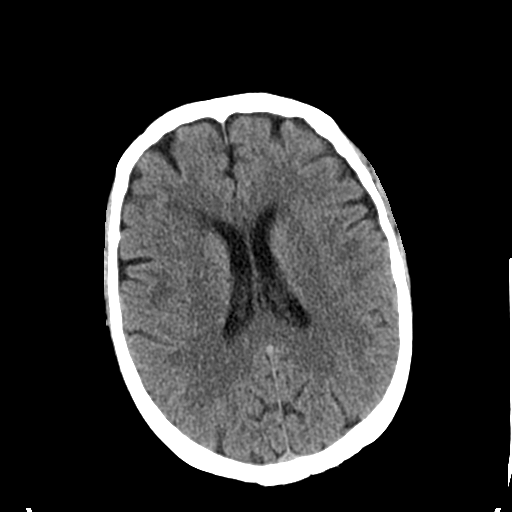
[im 23/35  brain]
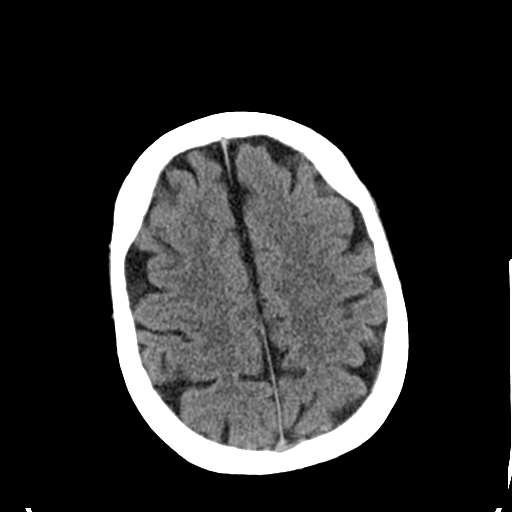
[im 26/35  brain]
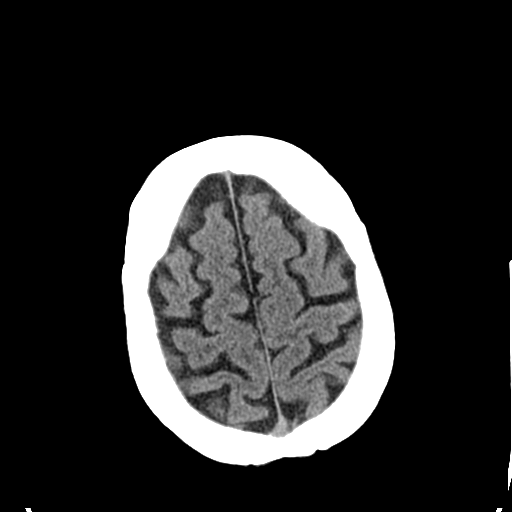
[im 29/35  brain]
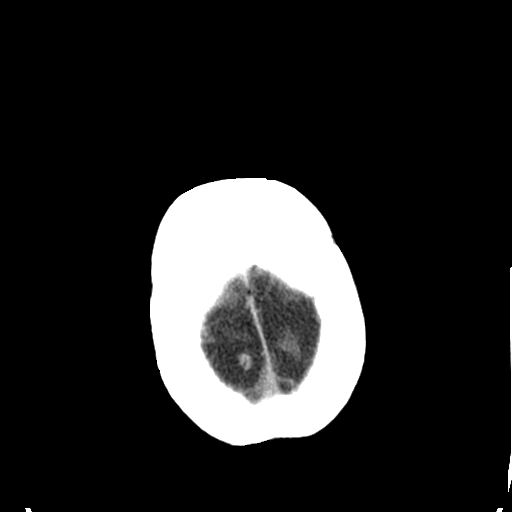
[im 29/35  bone]
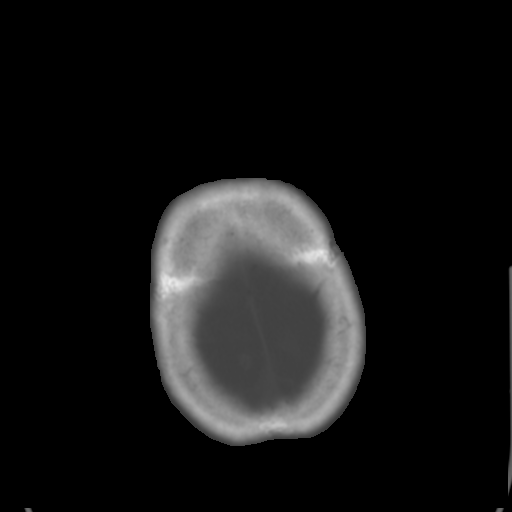
[im 32/35  brain]
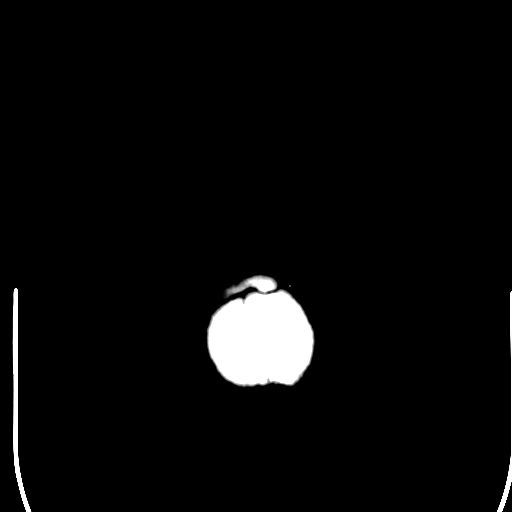

[Series 4: coronal soft tissue · coronal · 0.31mm/px · 3 of 64 slices shown]
[im 22/64  brain]
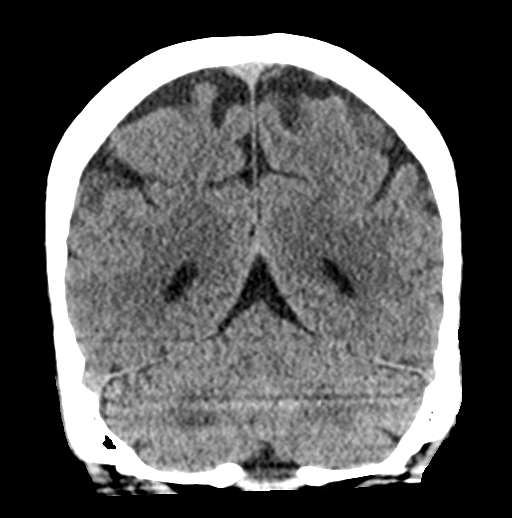
[im 29/64  brain]
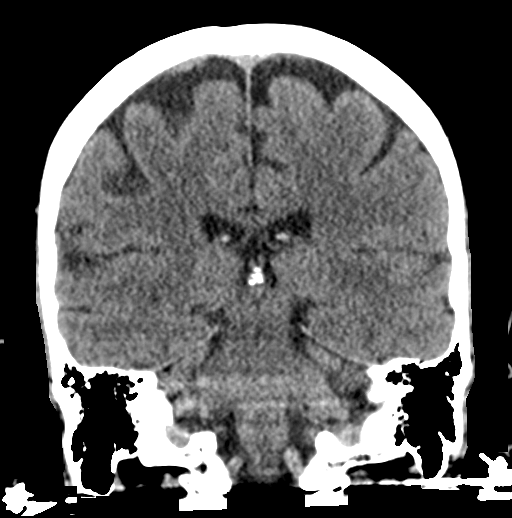
[im 36/64  brain]
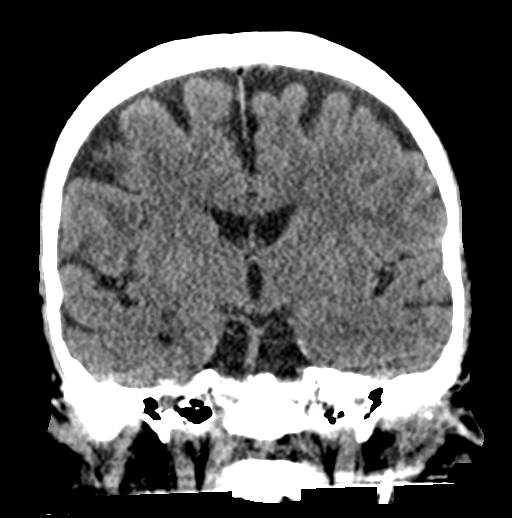

[Series 5: sagittal soft tissue · sagittal · 0.32mm/px · 3 of 54 slices shown]
[im 18/54  brain]
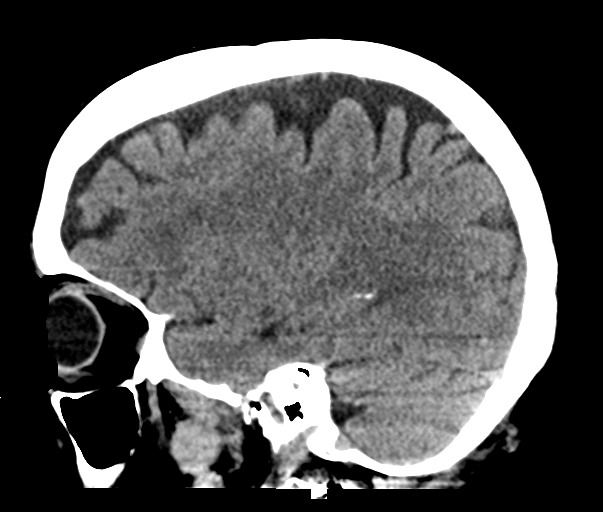
[im 27/54  brain]
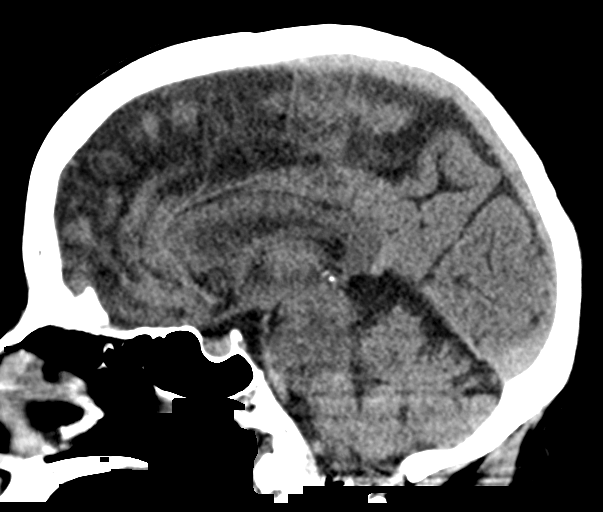
[im 36/54  brain]
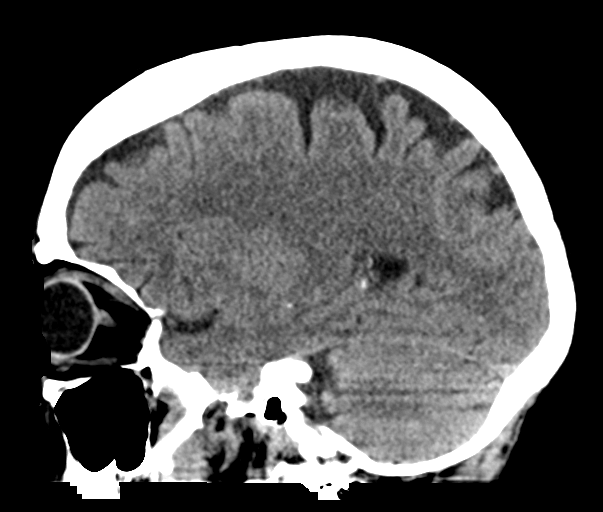

[16 of 47 positions shown; findings below may reference images not displayed]

FINDINGS: Brain: The ventricles and sulci appropriate size for patient's age.
Mild periventricular and deep white matter chronic microvascular
ischemic changes noted. There is no acute intracranial hemorrhage.
No mass effect midline shift. No extra-axial fluid collection.

Vascular: No hyperdense vessel or unexpected calcification.

Skull: Normal. Negative for fracture or focal lesion.

Sinuses/Orbits: No acute finding.

Other: None
IMPRESSION: 1. No acute intracranial pathology.
2. Mild chronic microvascular ischemic changes.

## 2022-07-09 DIAGNOSIS — M47816 Spondylosis without myelopathy or radiculopathy, lumbar region: Secondary | ICD-10-CM | POA: Diagnosis not present

## 2022-07-09 DIAGNOSIS — G894 Chronic pain syndrome: Secondary | ICD-10-CM | POA: Diagnosis not present

## 2022-07-09 DIAGNOSIS — M791 Myalgia, unspecified site: Secondary | ICD-10-CM | POA: Diagnosis not present

## 2022-07-09 DIAGNOSIS — M47812 Spondylosis without myelopathy or radiculopathy, cervical region: Secondary | ICD-10-CM | POA: Diagnosis not present

## 2022-07-10 NOTE — Progress Notes (Signed)
[facility] PREP Weekly Session  Patient Details  Name: EVALINA TABAK MRN: [id number] Date of Birth: 10-03-1956 Age: 66 y.o. PCP: Vivi Barrack, MD  Vitals:   07/10/22 1121  Weight: 175 lb (79.4 kg)     [facility] Weekly seesion - 07/10/22 1100       [facility] "PREP" Location   [facility] "PREP" Location [name]      Weekly Session   Topic Discussed Health habits   Sugar demo, limit added sugars 24gms/day; Yevette Edwards app            [name] 07/10/2022, 11:22 AM  [facility] PREP Weekly Session  Patient Details  Name: SHAHED YEOMAN MRN: [id number] Date of Birth: 1955-11-18 Age: 66 y.o. PCP: Vivi Barrack, MD  Vitals:   07/10/22 1121  Weight: 175 lb (79.4 kg)     [facility] Weekly seesion - 07/10/22 1100       [facility] "PREP" Location   [facility]PREP[facility] Location [place]      Weekly Session   Topic Discussed Health habits   Sugar demo, limit added sugars 24gms/day; YUKA app            Kinslea Frances B Chrishauna Mee 07/10/2022, 11:22 AM

## 2022-07-17 DIAGNOSIS — Z23 Encounter for immunization: Secondary | ICD-10-CM | POA: Diagnosis not present

## 2022-07-17 NOTE — Progress Notes (Signed)
[facility] PREP Weekly Session  Patient Details  Name: Savannah [name] MRN: [id number] Date of Birth: 08/01/1956 Age: 66 y.o. PCP: Vivi Barrack, MD  Vitals:   07/17/22 1145  Weight: 176 lb (79.8 kg)     [facility] Weekly seesion - 07/17/22 1100       [facility] "PREP" Location   [facility] "PREP" Location [name]      Weekly Session   Topic Discussed Restaurant Eating   Na limits 1500-2374m/day; Salt demo   Minutes exercised this week 70 minutes    Classes attended to date [place]9/03/2022, 11:46 AM

## 2022-07-24 ENCOUNTER — Encounter: Payer: Self-pay | Admitting: *Deleted

## 2022-07-24 NOTE — Progress Notes (Signed)
[facility] PREP Weekly Session  Patient Details  Name: Savannah [name] MRN: [id number] Date of Birth: [birth year] Age: 67 y.o. PCP: Vivi Barrack, MD  Vitals:   07/24/22 1205  Weight: 175 lb (79.4 kg)     [facility] Weekly seesion - 07/24/22 1200       [facility] "PREP" Location   [facility] "PREP" Location [name]      Weekly Session   Topic Discussed Stress management and problem solving   Importance of sleep, good sleep hygiene, introduction to guided meditation   Minutes exercised this week 130 minutes    Classes attended to date [place], [place] 07/24/2022, 12:06 PM

## 2022-08-03 ENCOUNTER — Other Ambulatory Visit: Payer: Self-pay | Admitting: Physician Assistant

## 2022-08-03 NOTE — Telephone Encounter (Signed)
Next Visit: 12/13/2022   Last Visit: 06/07/2022   Last Fill: 04/02/2022  Dx: Inflammatory arthritis  Current Dose per office note on 06/07/2022: colchicine 0.6 mg 1 capsule by mouth daily  Labs: 05/01/2022 Glucose 188, Creat. 1.06 GFR 58  Okay to refill Colchicine?

## 2022-08-05 ENCOUNTER — Other Ambulatory Visit (HOSPITAL_BASED_OUTPATIENT_CLINIC_OR_DEPARTMENT_OTHER): Payer: Self-pay | Admitting: Family

## 2022-08-05 DIAGNOSIS — I2510 Atherosclerotic heart disease of native coronary artery without angina pectoris: Secondary | ICD-10-CM

## 2022-08-06 ENCOUNTER — Ambulatory Visit: Payer: Medicare Other | Admitting: Internal Medicine

## 2022-08-09 DIAGNOSIS — B3732 Chronic candidiasis of vulva and vagina: Secondary | ICD-10-CM | POA: Diagnosis not present

## 2022-08-09 DIAGNOSIS — L309 Dermatitis, unspecified: Secondary | ICD-10-CM | POA: Diagnosis not present

## 2022-08-09 DIAGNOSIS — N952 Postmenopausal atrophic vaginitis: Secondary | ICD-10-CM | POA: Diagnosis not present

## 2022-08-09 DIAGNOSIS — L900 Lichen sclerosus et atrophicus: Secondary | ICD-10-CM | POA: Diagnosis not present

## 2022-08-10 DIAGNOSIS — H1132 Conjunctival hemorrhage, left eye: Secondary | ICD-10-CM | POA: Diagnosis not present

## 2022-08-13 ENCOUNTER — Other Ambulatory Visit: Payer: Self-pay | Admitting: Family Medicine

## 2022-08-14 ENCOUNTER — Encounter: Payer: Self-pay | Admitting: *Deleted

## 2022-08-14 NOTE — Progress Notes (Signed)
[facility] PREP Weekly Session  Patient Details  Name: ESLY SELVAGE MRN: [id number] Date of Birth: 1956/07/16 Age: 66 y.o. PCP: Vivi Barrack, MD  Vitals:   08/14/22 1205  Weight: 174 lb (78.9 kg)     [facility] Weekly seesion - 08/14/22 1200       [facility] "PREP" Location   [facility] "PREP" Location [name]      Weekly Session   Topic Discussed Finding support   Accountability measures internal and external, accountabiity partners, identifying sabotaging behaviors.   Minutes exercised this week 165 minutes    Classes attended to date [place] 08/14/2022, 12:07 PM

## 2022-08-21 ENCOUNTER — Encounter: Payer: Self-pay | Admitting: *Deleted

## 2022-08-21 NOTE — Progress Notes (Signed)
[facility] PREP Weekly Session  Patient Details  Name: GENIA PERIN MRN: [id number] Date of Birth: [birth year] Age: 66 y.o. PCP: Vivi Barrack, MD  Vitals:   08/21/22 1220  Weight: 171 lb (77.6 kg)     [facility] Weekly seesion - 08/21/22 1200       [facility] "PREP" Location   [facility]PREP[facility] Location [name]      Weekly Session   Topic Discussed Calorie breakdown   Review macronutrients,eating healthy on a budget.   Minutes exercised this week 140 minutes    Classes attended to date [place], [place] 08/21/2022, 12:22 PM

## 2022-08-28 ENCOUNTER — Other Ambulatory Visit: Payer: Self-pay | Admitting: Internal Medicine

## 2022-08-30 ENCOUNTER — Other Ambulatory Visit: Payer: Self-pay | Admitting: Family Medicine

## 2022-08-31 ENCOUNTER — Telehealth: Payer: Self-pay | Admitting: Internal Medicine

## 2022-08-31 ENCOUNTER — Other Ambulatory Visit: Payer: Self-pay | Admitting: Internal Medicine

## 2022-08-31 MED ORDER — ATORVASTATIN CALCIUM 40 MG PO TABS: 40.0000 mg | ORAL_TABLET | Freq: Every day | ORAL | 3 refills | Status: DC

## 2022-08-31 NOTE — Telephone Encounter (Signed)
*  STAT* If patient is at the pharmacy, call can be transferred to refill team.   1. Which medications need to be refilled? (please list name of each medication and dose if known)   atorvastatin (LIPITOR) 40 MG tablet    2. Which pharmacy/location (including street and city if local pharmacy) is medication to be sent to? [id number] [place] [place] [address] 3. Do they need a 30 day or 90 day supply?  90 day

## 2022-09-03 DIAGNOSIS — M47816 Spondylosis without myelopathy or radiculopathy, lumbar region: Secondary | ICD-10-CM | POA: Diagnosis not present

## 2022-09-03 DIAGNOSIS — M791 Myalgia, unspecified site: Secondary | ICD-10-CM | POA: Diagnosis not present

## 2022-09-03 DIAGNOSIS — G894 Chronic pain syndrome: Secondary | ICD-10-CM | POA: Diagnosis not present

## 2022-09-03 DIAGNOSIS — M47812 Spondylosis without myelopathy or radiculopathy, cervical region: Secondary | ICD-10-CM | POA: Diagnosis not present

## 2022-09-04 ENCOUNTER — Encounter: Payer: Self-pay | Admitting: *Deleted

## 2022-09-04 NOTE — Progress Notes (Signed)
[facility] PREP Weekly Session  Patient Details  Name: Savannah [name] MRN: [id number] Date of Birth: 15-Nov-1955 Age: 66 y.o. PCP: Vivi Barrack, MD  Vitals:   09/04/22 1242  Weight: 169 lb (76.7 kg)     [facility] Weekly seesion - 09/04/22 1200       [facility] "PREP" Location   [facility] "PREP" Location [name]      Weekly Session   Topic Discussed Other   Final class, Fit and strong survey, FIT Testing, [employer] talk. Final questions.   Minutes exercised this week 240 minutes    Classes attended to date [place], [place] 09/04/2022, 12:46 PM

## 2022-09-06 ENCOUNTER — Other Ambulatory Visit: Payer: Self-pay

## 2022-09-06 ENCOUNTER — Encounter: Payer: Self-pay | Admitting: *Deleted

## 2022-09-06 MED ORDER — LEVOTHYROXINE SODIUM 112 MCG PO TABS: 112.0000 ug | ORAL_TABLET | Freq: Every day | ORAL | 0 refills | Status: DC

## 2022-09-06 NOTE — Progress Notes (Signed)
[facility] PREP Evaluation  Patient Details  Name: Savannah [name] MRN: [id number] Date of Birth: 26-Mar-1956 Age: 66 y.o. PCP: Vivi Barrack, MD  Vitals:   09/06/22 0915  BP: (!) 108/44  Pulse: 76  Resp: 20  SpO2: 99%  Weight: 177 lb (80.3 kg)     [facility] Eval - 09/06/22 0915       [facility] "PREP" Location   [facility] "PREP" Location [facility]      Referral    Referring Provider C. Walker    Reason for referral Heart Failure   CAD   Program Start Date 06/19/22      Measurement   Waist Circumference 39 inches    Hip Circumference 42.5 inches    Body fat 22.5 percent      Mobility and Daily Activities   I find it easy to walk up or down two or more flights of stairs. 1    I have no trouble taking out the trash. 4    I do housework such as vacuuming and dusting on my own without difficulty. 3    I can easily lift a gallon of milk (8lbs). 2    I can easily walk a mile. 4    I have no trouble reaching into high cupboards or reaching down to pick up something from the floor. 2    I do not have trouble doing out-door work such as Armed forces logistics/support/administrative officer, raking leaves, or gardening. 2      Mobility and Daily Activities   I feel younger than my age. 2    I feel independent. 4    I feel energetic. 1    I live an active life.  2    I feel strong. 1    I feel healthy. 3    I feel active as other people my age. 3      How fit and strong are you.   Fit and Strong Total Score 34            Past Medical History:  Diagnosis Date   ABDOMINAL PAIN, CHRONIC 10/20/2009   Acute cystitis 08/17/2009   ALLERGIC RHINITIS 11/21/2007   Allergy    ASYMPTOMATIC POSTMENOPAUSAL STATUS 09/29/2008   Bariatric surgery status 07/07/2010   Bariatric surgery status 07/07/2010   Qualifier: Diagnosis of  By: Loanne Drilling MD, Sean A    Cancer [facility])    uterine   Cataract    Coronary artery disease    DEPRESSION 11/21/2007   DIABETES MELLITUS, TYPE II 07/08/2007   DYSPNEA 05/20/2009   Edema 52/77/8242    Eosinophilic esophagitis 35/36/1443   FEVER UNSPECIFIED 10/20/2009   FEVER, HX OF 03/09/2010   GERD (gastroesophageal reflux disease)    Headache(784.0) 02/06/2010   Hepatomegaly 01/06/2008   HYPERLIPIDEMIA 07/08/2007   HYPERTENSION 07/08/2007   LEUKOPENIA, MILD 09/29/2008   OBESITY 01/06/2008   OSTEOARTHRITIS 01/06/2008   Other chronic nonalcoholic liver disease 15/40/0867   PERIPHERAL NEUROPATHY 11/21/2007   Peripheral neuropathy    Postsurgical hypothyroidism 09/19/2010   SINUSITIS- ACUTE-NOS 11/21/2007   TB SKIN TEST, POSITIVE 02/06/2010   THYROID NODULE 03/09/2010   Past Surgical History:  Procedure Laterality Date   ABDOMINAL HYSTERECTOMY     BASAL CELL CARCINOMA EXCISION     CARDIAC CATHETERIZATION     CHOLECYSTECTOMY N/A 05/11/2021   Procedure: LAPAROSCOPIC CHOLECYSTECTOMY WITH POSSIBLE  INTRAOPERATIVE CHOLANGIOGRAM;  Surgeon: Johnathan Hausen, MD;  [facility];  Service: General;  Laterality: N/A;   COLONOSCOPY  CORONARY ATHERECTOMY N/A 05/03/2022   Procedure: CORONARY ATHERECTOMY;  Surgeon: Early Osmond, MD;  Location: [place] [facility];  Service: Cardiovascular;  Laterality: N/A;   CORONARY STENT INTERVENTION N/A 05/03/2022   Procedure: CORONARY STENT INTERVENTION;  Surgeon: Early Osmond, MD;  Location: [place] [facility];  Service: Cardiovascular;  Laterality: N/A;  lad   EYE SURGERY Bilateral 08/2018, 09/2018   FOOT SURGERY Left    10/2019   INTRAVASCULAR IMAGING/OCT N/A 05/03/2022   Procedure: INTRAVASCULAR IMAGING/OCT;  Surgeon: Early Osmond, MD;  Location: [place] [facility];  Service: Cardiovascular;  Laterality: N/A;   INTRAVASCULAR PRESSURE WIRE/FFR STUDY N/A 05/03/2022   Procedure: INTRAVASCULAR PRESSURE WIRE/FFR STUDY;  Surgeon: Early Osmond, MD;  Location: [place] [facility];  Service: Cardiovascular;  Laterality: N/A;   KNEE ARTHROSCOPY Left    LEFT HEART CATH AND CORONARY ANGIOGRAPHY N/A 01/20/2019   Procedure: LEFT HEART CATH AND  CORONARY ANGIOGRAPHY;  Surgeon: Belva Crome, MD;  Location: [place] [facility];  Service: Cardiovascular;  Laterality: N/A;   LYMPH NODE DISSECTION     OOPHORECTOMY     Right total knee replacement     RIGHT/LEFT HEART CATH AND CORONARY ANGIOGRAPHY N/A 05/03/2022   Procedure: RIGHT/LEFT HEART CATH AND CORONARY ANGIOGRAPHY;  Surgeon: Early Osmond, MD;  Location: [place] [facility];  Service: Cardiovascular;  Laterality: N/A;   ROUX-EN-Y GASTRIC BYPASS  2011   SKIN TAG REMOVAL     THYROIDECTOMY     TONSILLECTOMY AND ADENOIDECTOMY     UPPER GASTROINTESTINAL ENDOSCOPY     Social History   Tobacco Use  Smoking Status Former   Packs/day: 1.50   Years: 15.00   Total pack years: 22.50   Types: Cigarettes   Quit date: 68   Years since quitting: 31.8  Smokeless Tobacco Never    Norris Cross 09/06/2022, 5:18 PM  Completed PREP. Attended 9 of 12 education classes and 8 of 11 exercise classes. Improved Fit and Strong survey by 8 points.

## 2022-09-11 ENCOUNTER — Encounter: Payer: Self-pay | Admitting: Internal Medicine

## 2022-09-11 ENCOUNTER — Other Ambulatory Visit: Payer: Self-pay | Admitting: Internal Medicine

## 2022-09-11 ENCOUNTER — Other Ambulatory Visit: Payer: Self-pay | Admitting: Physician Assistant

## 2022-09-11 ENCOUNTER — Ambulatory Visit: Payer: Medicare Other | Attending: Internal Medicine | Admitting: Internal Medicine

## 2022-09-11 VITALS — BP 110/62 | HR 82 | Ht 71.0 in | Wt 174.6 lb

## 2022-09-11 DIAGNOSIS — I5032 Chronic diastolic (congestive) heart failure: Secondary | ICD-10-CM | POA: Diagnosis not present

## 2022-09-11 DIAGNOSIS — E785 Hyperlipidemia, unspecified: Secondary | ICD-10-CM | POA: Diagnosis not present

## 2022-09-11 DIAGNOSIS — Z955 Presence of coronary angioplasty implant and graft: Secondary | ICD-10-CM | POA: Insufficient documentation

## 2022-09-11 DIAGNOSIS — R600 Localized edema: Secondary | ICD-10-CM | POA: Diagnosis not present

## 2022-09-11 DIAGNOSIS — R55 Syncope and collapse: Secondary | ICD-10-CM | POA: Insufficient documentation

## 2022-09-11 DIAGNOSIS — R0602 Shortness of breath: Secondary | ICD-10-CM | POA: Insufficient documentation

## 2022-09-11 DIAGNOSIS — R0600 Dyspnea, unspecified: Secondary | ICD-10-CM | POA: Insufficient documentation

## 2022-09-11 DIAGNOSIS — Z79899 Other long term (current) drug therapy: Secondary | ICD-10-CM | POA: Diagnosis not present

## 2022-09-11 DIAGNOSIS — I2510 Atherosclerotic heart disease of native coronary artery without angina pectoris: Secondary | ICD-10-CM | POA: Diagnosis not present

## 2022-09-11 MED ORDER — POTASSIUM CHLORIDE CRYS ER 20 MEQ PO TBCR: 20.0000 meq | EXTENDED_RELEASE_TABLET | Freq: Every day | ORAL | 3 refills | Status: DC | PRN

## 2022-09-11 NOTE — Progress Notes (Signed)
Cardiology Office Note:    Date: 09/11/2022   ID:  [name], DOB 05-15-1956, MRN [id number]  PCP:  Savannah Barrack, MD  Cardiologist:  Savannah Munroe, MD  Electrophysiologist:  None   Referring MD: Savannah Barrack, MD   Chief Complaint/Reason for Referral: CAD  History of Present Illness:    Savannah [name] is a 66 y.o. female with a history of significant three-vessel coronary artery disease and involving distal small vascular territories, PVCs, esophageal rings with multiple previous dilations, and bariatric surgery in 2011 with appropriate postoperative weight loss presenting to clinic today for follow up for CAD.  She was last seen by me on 12/23/2020 and reported intermittent chest pain. She had a recent syncopal event causing a fall resulting in 4 broken ribs at that time. She denied any preceding chest pain or pressure. Her symptoms were likely secondary to orthostatic hypotension.  Today, she states that she has been doing well since her heart cath and stenting on 05/03/22. She reports that she has had significant symptom relief since stenting although it took a few weeks. She states that her DOE was the most severe symptoms prior to stenting. We reviewed complex lesion in prox LAD that we have been following, now treated.   She states that she has completed the [facility] PREP program and continues to exercise daily. She also goes to deep water aerobics routinely. Patient denies any exertional symptoms. 1 hr on treadmill frequently.  She is taking Lasix 35m as needed. She reports intermittent cramping in her hands that is exacerbated on days when she feels dehydrated. This worsens on days when she feels dry. Takes Mg supplement to help. We discussed K supplement on days when she takes lasix.   The patient denies chest pain, chest pressure, dyspnea at rest or with exertion, PND, orthopnea, or leg swelling. Denies cough, fever, chills, nausea, or vomiting. Denies syncope, presyncope,  or snoring. Denies dizziness or lightheadedness.   Past Medical History:  Diagnosis Date   ABDOMINAL PAIN, CHRONIC 10/20/2009   Acute cystitis 08/17/2009   ALLERGIC RHINITIS 11/21/2007   Allergy    ASYMPTOMATIC POSTMENOPAUSAL STATUS 09/29/2008   Bariatric surgery status 07/07/2010   Bariatric surgery status 07/07/2010   Qualifier: Diagnosis of  By: ELoanne DrillingMD, Sean A    Cancer ([facility]    uterine   Cataract    Coronary artery disease    DEPRESSION 11/21/2007   DIABETES MELLITUS, TYPE II 07/08/2007   DYSPNEA 05/20/2009   Edema 062/13/0865  Eosinophilic esophagitis 078/46/9629  FEVER UNSPECIFIED 10/20/2009   FEVER, HX OF 03/09/2010   GERD (gastroesophageal reflux disease)    Headache(784.0) 02/06/2010   Hepatomegaly 01/06/2008   HYPERLIPIDEMIA 07/08/2007   HYPERTENSION 07/08/2007   LEUKOPENIA, MILD 09/29/2008   OBESITY 01/06/2008   OSTEOARTHRITIS 01/06/2008   Other chronic nonalcoholic liver disease 052/84/1324  PERIPHERAL NEUROPATHY 11/21/2007   Peripheral neuropathy    Postsurgical hypothyroidism 09/19/2010   SINUSITIS- ACUTE-NOS 11/21/2007   TB SKIN TEST, POSITIVE 02/06/2010   THYROID NODULE 03/09/2010    Past Surgical History:  Procedure Laterality Date   ABDOMINAL HYSTERECTOMY     BASAL CELL CARCINOMA EXCISION     CARDIAC CATHETERIZATION     CHOLECYSTECTOMY N/A 05/11/2021   Procedure: LAPAROSCOPIC CHOLECYSTECTOMY WITH POSSIBLE  INTRAOPERATIVE CHOLANGIOGRAM;  Surgeon: MJohnathan Hausen MD;  [facility];  Service: General;  Laterality: N/A;   COLONOSCOPY     CORONARY ATHERECTOMY N/A 05/03/2022   Procedure: CORONARY ATHERECTOMY;  Surgeon: Early Osmond, MD;  Location: [place] [facility];  Service: Cardiovascular;  Laterality: N/A;   CORONARY STENT INTERVENTION N/A 05/03/2022   Procedure: CORONARY STENT INTERVENTION;  Surgeon: Early Osmond, MD;  Location: [place] [facility];  Service: Cardiovascular;  Laterality: N/A;  lad   EYE SURGERY Bilateral 08/2018,  09/2018   FOOT SURGERY Left    10/2019   INTRAVASCULAR IMAGING/OCT N/A 05/03/2022   Procedure: INTRAVASCULAR IMAGING/OCT;  Surgeon: Early Osmond, MD;  Location: [place] [facility];  Service: Cardiovascular;  Laterality: N/A;   INTRAVASCULAR PRESSURE WIRE/FFR STUDY N/A 05/03/2022   Procedure: INTRAVASCULAR PRESSURE WIRE/FFR STUDY;  Surgeon: Early Osmond, MD;  Location: [place] [facility];  Service: Cardiovascular;  Laterality: N/A;   KNEE ARTHROSCOPY Left    LEFT HEART CATH AND CORONARY ANGIOGRAPHY N/A 01/20/2019   Procedure: LEFT HEART CATH AND CORONARY ANGIOGRAPHY;  Surgeon: Belva Crome, MD;  Location: [place] [facility];  Service: Cardiovascular;  Laterality: N/A;   LYMPH NODE DISSECTION     OOPHORECTOMY     Right total knee replacement     RIGHT/LEFT HEART CATH AND CORONARY ANGIOGRAPHY N/A 05/03/2022   Procedure: RIGHT/LEFT HEART CATH AND CORONARY ANGIOGRAPHY;  Surgeon: Early Osmond, MD;  Location: [place] [facility];  Service: Cardiovascular;  Laterality: N/A;   ROUX-EN-Y GASTRIC BYPASS  2011   SKIN TAG REMOVAL     THYROIDECTOMY     TONSILLECTOMY AND ADENOIDECTOMY     UPPER GASTROINTESTINAL ENDOSCOPY      Current Medications: Current Meds  Medication Sig   acetaminophen (TYLENOL) 500 MG tablet Take 2 tablets (1,000 mg total) by mouth every 6 (six) hours as needed for mild pain or fever.   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  Correct leg length and improve pronation BL   aspirin EC 81 MG tablet TAKE 1 TABLET (81 MG TOTAL) BY MOUTH DAILY. SWALLOW WHOLE.   atorvastatin (LIPITOR) 40 MG tablet Take 1 tablet (40 mg total) by mouth daily.   canagliflozin (INVOKANA) 300 MG TABS tablet Take 150 mg by mouth daily before breakfast.   Cholecalciferol (VITAMIN D) 50 MCG (2000 UT) tablet Take 4,000 Units by mouth in the morning.   CLINPRO 5000 1.1 % PSTE Place 1 Application onto teeth at bedtime.   clopidogrel (PLAVIX) 75 MG tablet Take 1  tablet (75 mg total) by mouth daily.   colchicine 0.6 MG tablet TAKE 1 TABLET BY MOUTH EVERY DAY   cyclobenzaprine (FLEXERIL) 10 MG tablet Take 10 mg by mouth at bedtime as needed for muscle spasms.   diclofenac sodium (VOLTAREN) 1 % GEL Apply 4 g topically 4 (four) times daily.   Dulaglutide (TRULICITY) 4.5 UX/3.2GM SOPN Inject 4.5 mg as directed once a week. (Patient taking differently: Inject 4.5 mg as directed every Tuesday at 6 PM.)   fluconazole (DIFLUCAN) 200 MG tablet Take 200 mg by mouth once a week.   furosemide (LASIX) 20 MG tablet Take 1 tablet (20 mg total) by mouth as needed.   gabapentin (NEURONTIN) 600 MG tablet TAKE 2 TABLETS BY MOUTH 3 TIMES DAILY.   Halobetasol & Lactic Acid (ULTRAVATE X, OINTMENT, EX) Apply 1 Application topically 2 (two) times daily.   levalbuterol (XOPENEX HFA) 45 MCG/ACT inhaler Inhale 1 puff into the lungs every 4 (four) hours as needed for wheezing.   levothyroxine (SYNTHROID) 112 MCG tablet Take 1 tablet (112 mcg total) by mouth daily.   lidocaine (LIDODERM) 5 %  PLACE 1 PATCH ONTO THE SKIN DAILY. REMOVE & DISCARD PATCH WITHIN 12 HOURS OR AS DIRECTED BY MD   loratadine (CLARITIN) 10 MG tablet Take 10 mg by mouth in the morning.   MAGNESIUM PO Take 3 tablets by mouth daily as needed (foot cramps).   metFORMIN (GLUCOPHAGE-XR) 500 MG 24 hr tablet TAKE 4 TABLETS (2,000 MG TOTAL) BY MOUTH DAILY.   Multiple Vitamin (MULTIVITAMIN) tablet Take 1 tablet by mouth in the morning and at bedtime.   nitroGLYCERIN (NITROSTAT) 0.4 MG SL tablet Place 1 tablet (0.4 mg total) under the tongue every 5 (five) minutes as needed.   ONETOUCH ULTRA test strip USE TO MONITOR GLUCOSE LEVELS ONCE PER DAY E11.40   OxyCODONE HCl, Abuse Deter, (OXAYDO) 5 MG TABA Take 5 mg by mouth daily as needed (severe pain.).   pantoprazole (PROTONIX) 40 MG tablet TAKE 1 TABLET BY MOUTH TWICE A DAY   potassium chloride SA (KLOR-CON M20) 20 MEQ tablet Take 1 tablet (20 mEq total) by mouth daily as  needed. MAY TAKE 1 TABLETS AS NEEDED ONCE DAILY FOR CRAMPS/ WITH TAKING LASIX   repaglinide (PRANDIN) 2 MG tablet Take 1 tablet (2 mg total) by mouth 3 (three) times daily before meals.   traMADol (ULTRAM) 50 MG tablet Take 50 mg by mouth every 6 (six) hours as needed (pain).     Allergies:   Ace inhibitors, Caffeine, Ciprofloxacin, Morphine and related, Mucinex [guaifenesin er], Nsaids, Other, Silicone, and Tape   Social History   Tobacco Use   Smoking status: Former    Packs/day: 1.50    Years: 15.00    Total pack years: 22.50    Types: Cigarettes    Quit date: 1992    Years since quitting: 31.8   Smokeless tobacco: Never  Vaping Use   Vaping Use: Never used  Substance Use Topics   Alcohol use: Yes    Comment: rarely   Drug use: Never     Family History: The patient's family history includes Bladder Cancer in her sister; Breast cancer in her maternal aunt and sister; Colon cancer in her maternal uncle; Congestive Heart Failure in her father; Diabetes in her father, maternal aunt, maternal grandmother, maternal uncle, mother, and paternal grandmother; Hypertension in her father; Liver cancer in her maternal aunt; Multiple sclerosis in her sister; Myelodysplastic syndrome in her father; Neuropathy in her mother and paternal aunt; Osteoporosis in her sister.  ROS:   Please see the history of present illness.    All other systems reviewed and are negative.  EKGs/Labs/Other Studies Reviewed:    The following studies were reviewed today:  EKG: EKG was not ordered today. 12/23/2020: EKG showed normal sinus rhythm   I have independently reviewed the images noted below:   US Arterial BLE study 06/06/22: IMPRESSION: Resting ABI the bilateral lower extremities elevated, potentially secondary to noncompressible vessels. Segmental exam and directed duplex of the bilateral lower extremity demonstrates waveforms within normal limits throughout.  Left/Right heart cath 05/03/22:    Prox LAD lesion is 80% stenosed.   Mid LAD to Dist LAD lesion is 75% stenosed.   Ost Cx to Mid Cx lesion is 15% stenosed.   Prox RCA lesion is 60% stenosed.   Mid RCA lesion is 60% stenosed.   2nd Mrg-1 lesion is 50% stenosed.   2nd Mrg-2 lesion is 50% stenosed.   A stent was successfully placed.   Post intervention, there is a 0% residual stenosis.   LV end diastolic pressure is  low. 1.  High-grade proximal LAD lesion treated with OCT guided PCI with atherectomy and 1 drug-eluting stent.  There is diffuse distal disease of the LAD that should be treated medically due to the the small caliber vessel there. 2.  RFR negative moderate proximal to mid RCA disease. 3.  Mild disease of the obtuse marginal. 4.  Cardiac output of 6.9 L/min and cardiac index of 3.4 L/min/m with relatively low filling pressures.  Echocardiogram 04/04/21: 1. Left ventricular ejection fraction, by estimation, is 60 to 65%. The  left ventricle has normal function. The left ventricle has no regional  wall motion abnormalities. Left ventricular diastolic parameters were  normal. The average left ventricular  global longitudinal strain is -19.9 %. The global longitudinal strain is  normal.   2. Right ventricular systolic function is normal. The right ventricular  size is normal. There is normal pulmonary artery systolic pressure. The  estimated right ventricular systolic pressure is 36.6 mmHg.   3. The mitral valve is grossly normal. Mild mitral valve regurgitation.  No evidence of mitral stenosis.   4. The aortic valve is tricuspid. Aortic valve regurgitation is not  visualized. No aortic stenosis is present.   5. The inferior vena cava is normal in size with greater than 50%  respiratory variability, suggesting right atrial pressure of 3 mmHg.   Left Heart Cath 01/20/2019: Diffuse LAD and right coronary calcification. Widely patent left main Eccentric 70% proximal LAD within a calcified segment.  Distal to apical  LAD is calcified and contains greater than 90% diffuse obstruction. Circumflex coronary artery is dominant.  The second obtuse marginal which is moderate in size contains 85% stenosis followed by 80% stenosis within a tortuous mid segment.  The circumflex is otherwise widely patent. Right coronary is nondominant and contains proximal to mid heavy calcification with 70 to 80% stenosis. Left ventricular function is normal.  LVEDP is normal.  Recent Labs: 03/01/2022: ALT 47; TSH 2.81 05/01/2022: BNP 25.1; BUN 22; Creatinine, Ser 1.06; Platelets 187 05/03/2022: Hemoglobin 12.2; Potassium 4.4; Sodium 140  Recent Lipid Panel    Component Value Date/Time   CHOL 127 03/01/2022 1112   CHOL 136 04/29/2019 1336   TRIG 94.0 03/01/2022 1112   HDL 50.20 03/01/2022 1112   HDL 47 04/29/2019 1336   CHOLHDL 3 03/01/2022 1112   VLDL 18.8 03/01/2022 1112   LDLCALC 58 03/01/2022 1112   LDLCALC 69 04/29/2019 1336   LDLDIRECT 99.0 10/12/2011 1219    Physical Exam:    VS:  BP 110/62   Pulse 82   Ht 5' 11"  (1.803 m)   Wt 174 lb 9.6 oz (79.2 kg)   SpO2 99%   BMI 24.35 kg/m     Wt Readings from Last 5 Encounters:  09/11/22 174 lb 9.6 oz (79.2 kg)  09/06/22 177 lb (80.3 kg)  09/04/22 169 lb (76.7 kg)  08/21/22 171 lb (77.6 kg)  08/14/22 174 lb (78.9 kg)    Constitutional: No acute distress Eyes: sclera non-icteric, normal conjunctiva and lids ENMT: normal dentition, moist mucous membranes Cardiovascular: regular rhythm, normal rate, no murmurs. S1 and S2 normal.  No jugular venous distention.  Respiratory: clear to auscultation bilaterally GI : normal bowel sounds, soft and nontender. No distention.   MSK: extremities warm, well perfused. 1+ edema in LLE.  NEURO: grossly nonfocal exam, moves all extremities. PSYCH: alert and oriented x 3, normal mood and affect.   ASSESSMENT:    1. Coronary artery disease involving native coronary artery  of native heart without angina pectoris   2. Shortness  of breath   3. Hyperlipidemia LDL goal <70   4. Leg edema   5. Chronic diastolic CHF (congestive heart failure) ([place])   6. S/P coronary artery stent placement   7. Syncope and collapse   8. Dyspnea, unspecified type   9. Medication management     PLAN:    Chronic diastolic CHF -LVEF 96-29%, right ventricular systolic function normal, normal pulmonary artery pressure, and no significant valvular abnormalities per echo on 04/04/21. Average left ventricular global longitudinal strain is -19.9 %. The global longitudinal strain is normal.The estimated right ventricular systolic pressure is 52.8 mmHg. - NO DOE or shortness of breath at rest at this time. - Doing well with current exercise regimen. - prn lasix with prn Kcl.  Coronary artery disease - No angina symptoms at this time. Reminds [place] that she had severe DOE and some chest pain prior to stenting on 05/03/22. - Cath interventions: High-grade proximal LAD lesion treated with OCT guided PCI with atherectomy and 1 drug-eluting stent.  There is diffuse distal disease of the LAD that should be treated medically due to the the small caliber vessel there. RFR negative moderate proximal to mid RCA disease. - Continue ASA 55m daily and Plavix 755mdaily. We discussed stopping DAPT at 6 mo (Dec '23) however she feels hesitant and would like to continue for 12 mo. Counseled on bleeding risk. Would drop ASA at 1 year mark and cont Plavix indefinitely.   Type 2 diabetes mellitus - A1c of 7.0 on 01/16/22.   Dyslipidemia - Total Cholesterol of 127, LDL of 58, and HDL of 50.20. LDL at goal. Trigs 94. - Continue Atorvastatin 4031maily.  Hypokalemia - Reports intermittent, severe cramping pain in her hands when dehydrated.  - Symptoms improve with supplemental Mg 600-800 mg and hydration. - Advised to instead take MagOx daily, no more than 400 mg.  - Will start Potassium 61m36mrn on days that she takes Lasix.   Follow up: 6 months  Total time of  encounter: 30 minutes total time of encounter, including 20 minutes spent in face-to-face patient care on the date of this encounter. This time includes coordination of care and counseling regarding above mentioned problem list. Remainder of non-face-to-face time involved reviewing chart documents/testing relevant to the patient encounter and documentation in the medical record. I have independently reviewed documentation from referring provider.   GayaCherlynn Kaiser [place]  [facility]    Medication Adjustments/Labs and Tests Ordered: Current medicines are reviewed at length with the patient today.  Concerns regarding medicines are outlined above.   No orders of the defined types were placed in this encounter.   Meds ordered this encounter  Medications   potassium chloride SA (KLOR-CON M20) 20 MEQ tablet    Sig: Take 1 tablet (20 mEq total) by mouth daily as needed. MAY TAKE 1 TABLETS AS NEEDED ONCE DAILY FOR CRAMPS/ WITH TAKING LASIX    Dispense:  30 tablet    Refill:  3    Patient Instructions  Medication Instructions:  MAY TAKE POTASSIUM 20MEQ ONCE DAILY FOR CRAMPING OR WITH LASIX  *If you need a refill on your cardiac medications before your next appointment, please call your pharmacy*  Lab Work: None Ordered At This Time.   If you have labs (blood work) drawn today and your tests are completely normal, you will receive your results only by: [facility] have MyChart) OR  A paper copy in the mail If you have any lab test that is abnormal or we need to change your treatment, we will call you to review the results.  Testing/Procedures: None Ordered At This Time.   Follow-Up: At [facility], you and your health needs are our priority.  As part of our continuing mission to provide you with exceptional heart care, we have created designated Provider Care Teams.  These Care Teams include your primary Cardiologist (physician) and Advanced Practice  Providers (APPs -  Physician Assistants and Nurse Practitioners) who all work together to provide you with the care you need, when you need it.   Your next appointment:   6 month(s)  The format for your next appointment:   In Person  Provider:   Elouise Munroe, MD             I,Alexis Herring,acting as a scribe for Savannah Munroe, MD.,have documented all relevant documentation on the behalf of Savannah Munroe, MD,as directed by  Savannah Munroe, MD while in the presence of Savannah Munroe, MD.  I, Savannah Munroe, MD, have reviewed all documentation for this visit. The documentation on 09/11/22 for the exam, diagnosis, procedures, and orders are all accurate and complete.

## 2022-09-11 NOTE — Patient Instructions (Signed)
Medication Instructions:  MAY TAKE POTASSIUM 20MEQ ONCE DAILY FOR CRAMPING OR WITH LASIX  *If you need a refill on your cardiac medications before your next appointment, please call your pharmacy*  Lab Work: None Ordered At This Time.   If you have labs (blood work) drawn today and your tests are completely normal, you will receive your results only by: [place] (if you have MyChart) OR A paper copy in the mail If you have any lab test that is abnormal or we need to change your treatment, we will call you to review the results.  Testing/Procedures: None Ordered At This Time.   Follow-Up: At [facility], you and your health needs are our priority.  As part of our continuing mission to provide you with exceptional heart care, we have created designated Provider Care Teams.  These Care Teams include your primary Cardiologist (physician) and Advanced Practice Providers (APPs -  Physician Assistants and Nurse Practitioners) who all work together to provide you with the care you need, when you need it.   Your next appointment:   6 month(s)  The format for your next appointment:   In Person  Provider:   Elouise Munroe, MD

## 2022-09-11 NOTE — Telephone Encounter (Signed)
Next Visit: 12/13/2022  Last Visit: 06/07/2022  Last Fill: 06/11/2022  Dx: DDD (degenerative disc disease), lumbar   Current Dose per office note on 06/07/2022: not discussed  Okay to refill Lidocaine patches?

## 2022-09-12 DIAGNOSIS — D2272 Melanocytic nevi of left lower limb, including hip: Secondary | ICD-10-CM | POA: Diagnosis not present

## 2022-09-12 DIAGNOSIS — Z85828 Personal history of other malignant neoplasm of skin: Secondary | ICD-10-CM | POA: Diagnosis not present

## 2022-09-12 DIAGNOSIS — D0362 Melanoma in situ of left upper limb, including shoulder: Secondary | ICD-10-CM | POA: Diagnosis not present

## 2022-09-12 DIAGNOSIS — L299 Pruritus, unspecified: Secondary | ICD-10-CM | POA: Diagnosis not present

## 2022-09-12 DIAGNOSIS — L821 Other seborrheic keratosis: Secondary | ICD-10-CM | POA: Diagnosis not present

## 2022-09-12 DIAGNOSIS — D2271 Melanocytic nevi of right lower limb, including hip: Secondary | ICD-10-CM | POA: Diagnosis not present

## 2022-09-12 DIAGNOSIS — D225 Melanocytic nevi of trunk: Secondary | ICD-10-CM | POA: Diagnosis not present

## 2022-09-12 DIAGNOSIS — L900 Lichen sclerosus et atrophicus: Secondary | ICD-10-CM | POA: Diagnosis not present

## 2022-09-12 DIAGNOSIS — L578 Other skin changes due to chronic exposure to nonionizing radiation: Secondary | ICD-10-CM | POA: Diagnosis not present

## 2022-09-12 DIAGNOSIS — D2230 Melanocytic nevi of unspecified part of face: Secondary | ICD-10-CM | POA: Diagnosis not present

## 2022-09-12 DIAGNOSIS — Z86018 Personal history of other benign neoplasm: Secondary | ICD-10-CM | POA: Diagnosis not present

## 2022-09-12 DIAGNOSIS — D485 Neoplasm of uncertain behavior of skin: Secondary | ICD-10-CM | POA: Diagnosis not present

## 2022-09-26 ENCOUNTER — Encounter: Payer: Self-pay | Admitting: Family Medicine

## 2022-09-26 ENCOUNTER — Telehealth: Payer: Self-pay | Admitting: *Deleted

## 2022-09-26 DIAGNOSIS — D0362 Melanoma in situ of left upper limb, including shoulder: Secondary | ICD-10-CM | POA: Diagnosis not present

## 2022-09-26 NOTE — Telephone Encounter (Signed)
Pharmacy requesting refill Rx Trulicity last done by Renato Shin, MD

## 2022-09-27 ENCOUNTER — Other Ambulatory Visit: Payer: Self-pay | Admitting: *Deleted

## 2022-09-27 MED ORDER — TRULICITY 4.5 MG/0.5ML ~~LOC~~ SOAJ: 4.5000 mg | SUBCUTANEOUS | 3 refills | Status: DC

## 2022-09-27 NOTE — Telephone Encounter (Signed)
Ok with me. Please place any necessary orders. 

## 2022-09-27 NOTE — Telephone Encounter (Signed)
Please see phone note. Ok to send in.  Algis Greenhouse. Jerline Pain, MD 09/27/2022 12:46 PM

## 2022-09-27 NOTE — Telephone Encounter (Signed)
Rx sent by Junious Dresser.

## 2022-09-27 NOTE — Telephone Encounter (Signed)
Rx send to pharmacy  

## 2022-09-30 ENCOUNTER — Other Ambulatory Visit: Payer: Self-pay | Admitting: Endocrinology

## 2022-09-30 ENCOUNTER — Encounter: Payer: Self-pay | Admitting: Family Medicine

## 2022-10-01 ENCOUNTER — Other Ambulatory Visit: Payer: Self-pay | Admitting: *Deleted

## 2022-10-01 MED ORDER — LEVOTHYROXINE SODIUM 112 MCG PO TABS: 112.0000 ug | ORAL_TABLET | Freq: Every day | ORAL | 0 refills | Status: DC

## 2022-10-01 NOTE — Telephone Encounter (Signed)
Ok to refill.  Algis Greenhouse. Jerline Pain, MD 10/01/2022 12:28 PM

## 2022-10-01 NOTE — Telephone Encounter (Signed)
Last refill by Dr Mamie Nick to refill?

## 2022-10-04 ENCOUNTER — Other Ambulatory Visit: Payer: Self-pay | Admitting: Endocrinology

## 2022-10-06 DIAGNOSIS — Z23 Encounter for immunization: Secondary | ICD-10-CM | POA: Diagnosis not present

## 2022-10-15 ENCOUNTER — Encounter: Payer: Self-pay | Admitting: Family Medicine

## 2022-10-15 ENCOUNTER — Ambulatory Visit (INDEPENDENT_AMBULATORY_CARE_PROVIDER_SITE_OTHER): Payer: Medicare Other | Admitting: Family Medicine

## 2022-10-15 VITALS — BP 96/62 | HR 70 | Temp 97.7°F | Ht 71.0 in | Wt 178.2 lb

## 2022-10-15 DIAGNOSIS — E1151 Type 2 diabetes mellitus with diabetic peripheral angiopathy without gangrene: Secondary | ICD-10-CM | POA: Diagnosis not present

## 2022-10-15 DIAGNOSIS — K219 Gastro-esophageal reflux disease without esophagitis: Secondary | ICD-10-CM | POA: Diagnosis not present

## 2022-10-15 DIAGNOSIS — K2100 Gastro-esophageal reflux disease with esophagitis, without bleeding: Secondary | ICD-10-CM | POA: Diagnosis not present

## 2022-10-15 DIAGNOSIS — D3132 Benign neoplasm of left choroid: Secondary | ICD-10-CM | POA: Diagnosis not present

## 2022-10-15 DIAGNOSIS — E890 Postprocedural hypothyroidism: Secondary | ICD-10-CM | POA: Diagnosis not present

## 2022-10-15 DIAGNOSIS — Z794 Long term (current) use of insulin: Secondary | ICD-10-CM | POA: Diagnosis not present

## 2022-10-15 DIAGNOSIS — H04123 Dry eye syndrome of bilateral lacrimal glands: Secondary | ICD-10-CM | POA: Diagnosis not present

## 2022-10-15 DIAGNOSIS — E113212 Type 2 diabetes mellitus with mild nonproliferative diabetic retinopathy with macular edema, left eye: Secondary | ICD-10-CM | POA: Diagnosis not present

## 2022-10-15 DIAGNOSIS — H353131 Nonexudative age-related macular degeneration, bilateral, early dry stage: Secondary | ICD-10-CM | POA: Diagnosis not present

## 2022-10-15 LAB — POCT GLYCOSYLATED HEMOGLOBIN (HGB A1C): Hemoglobin A1C: 7.5 % — AB (ref 4.0–5.6)

## 2022-10-15 MED ORDER — CANAGLIFLOZIN 300 MG PO TABS
300.0000 mg | ORAL_TABLET | Freq: Every day | ORAL | 5 refills | Status: DC
Start: 2022-10-15 — End: 2023-02-14

## 2022-10-15 NOTE — Assessment & Plan Note (Signed)
She is on Protonix 40 mg twice daily.  Advised her to follow-up with GI soon if this continues to be an issue.

## 2022-10-15 NOTE — Assessment & Plan Note (Signed)
She is still looking to establish with endocrinology.  Her A1c today is 7.5.  We will refill her Invokana.  She will continue her other regimen with Trulicity 4.5 mg daily and refractile Nied 2 mg 3 times daily with meals.

## 2022-10-15 NOTE — Progress Notes (Signed)
   Savannah [name] is a 66 y.o. female who presents today for an office visit.  Assessment/Plan:  New/Acute Problems: Flank Pain No red flags.  Likely musculoskeletal.  We did discuss imaging however deferred for today.  She can continue over-the-counter meds.  Also can use heating pad.  We will check labs today per patient request including c-Met.  Eustachian Tube Dysfunction  No red flags.  No signs of infection.  Likely secondary to seasonal allergies.  She will try switching her allergy med to [place].  Also recommended Flonase.  We discussed auto insufflation.  We did discuss referral to ENT however deferred for today.  She will let me know if this continues to be an issue.  Epigastric abdominal pain Likely secondary to GERD and her known history of esophagitis that we will check requested labs today including CBC and lipase.  Chronic Problems Addressed Today: Type 2 diabetes mellitus with diabetic peripheral angiopathy without gangrene, with long-term current use of insulin ([place]) She is still looking to establish with endocrinology.  Her A1c today is 7.5.  We will refill her Invokana.  She will continue her other regimen with Trulicity 4.5 mg daily and refractile Nied 2 mg 3 times daily with meals.  Postsurgical hypothyroidism On Synthroid 112 mcg daily.  Check TSH.  GERD with esophagitis She is on Protonix 40 mg twice daily.  Advised her to follow-up with GI soon if this continues to be an issue.     Subjective:  HPI:  See A/p for status of chronic conditions.    She would like to have her A1c and TSH checked today. She has been following with endocrinology for this but her endocrinologist stopped practicing about 6 months ago. She is looking to establish with an endocrinologist.   She has also had some issue with left flank pain. This has been going on for several months. Worse with certain positions. Also some pain in her ribs on the right.  No pain at rest.  Worse with certain  sitting positions.  She is also having some issues with epigastric pain. She has also had some worsening dysphagia the last several weeks.  Sometimes coughs after eating.  Has been diagnosed with eosinophilic esophagitis in the past and has had dilation previously.  She is on Protonix 40 mg twice daily.       Objective:  Physical Exam: BP 96/62   Pulse 70   Temp 97.7 F (36.5 C) (Temporal)   Ht _0  (1.803 m)   Wt 178 lb 3.2 oz (80.8 kg)   SpO2 99%   BMI 24.85 kg/m   Gen: No acute distress, resting comfortably ENT: Bilateral TMs with clear effusion CV: Regular rate and rhythm with no murmurs appreciated Pulm: Normal work of breathing, clear to auscultation bilaterally with no crackles, wheezes, or rhonchi MSK: Back without deformities.  Tenderness palpation along left CVA. Neuro: Grossly normal, moves all extremities Psych: Normal affect and thought content      Suhailah Kwan M. Jerline Pain, MD 10/15/2022 2:55 PM

## 2022-10-15 NOTE — Patient Instructions (Signed)
It was very nice to see you today!  Your A1c today is 7.5.  We will refill your diabetes medications  I will check your thyroid numbers.  I think you are probably having some musculoskeletal pain in your back.  You can use over-the-counter meds as needed.  Please let me know if you would like to see a sports medicine or physical therapist for this.  You do have some fluid buildup behind the ears.  Please try a different allergy medication or taking Flonase to see if this helps.  Please let me know if you would like a referral to see a GI doctor for your abdominal pain.  Take care, Dr Jerline Pain  PLEASE NOTE:  If you had any lab tests please let us know if you have not heard back within a few days. You may see your results on mychart before we have a chance to review them but we will give you a call once they are reviewed by [place]. If we ordered any referrals today, please let us know if you have not heard from their office within the next week.   Please try these tips to maintain a healthy lifestyle:  Eat at least 3 REAL meals and 1-2 snacks per day.  Aim for no more than 5 hours between eating.  If you eat breakfast, please do so within one hour of getting up.   Each meal should contain half fruits/vegetables, one quarter protein, and one quarter carbs (no bigger than a computer mouse)  Cut down on sweet beverages. This includes juice, soda, and sweet tea.   Drink at least 1 glass of water with each meal and aim for at least 8 glasses per day  Exercise at least 150 minutes every week.

## 2022-10-15 NOTE — Assessment & Plan Note (Signed)
On Synthroid 112 mcg daily.  Check TSH.

## 2022-10-16 DIAGNOSIS — M791 Myalgia, unspecified site: Secondary | ICD-10-CM | POA: Diagnosis not present

## 2022-10-16 LAB — COMPREHENSIVE METABOLIC PANEL
ALT: 40 U/L — ABNORMAL HIGH (ref 0–35)
AST: 27 U/L (ref 0–37)
Albumin: 4.3 g/dL (ref 3.5–5.2)
Alkaline Phosphatase: 122 U/L — ABNORMAL HIGH (ref 39–117)
BUN: 25 mg/dL — ABNORMAL HIGH (ref 6–23)
CO2: 26 mEq/L (ref 19–32)
Calcium: 9 mg/dL (ref 8.4–10.5)
Chloride: 106 mEq/L (ref 96–112)
Creatinine, Ser: 1.33 mg/dL — ABNORMAL HIGH (ref 0.40–1.20)
GFR: 41.13 mL/min — ABNORMAL LOW (ref >60.00)
Glucose, Bld: 106 mg/dL — ABNORMAL HIGH (ref 70–99)
Potassium: 4.6 mEq/L (ref 3.5–5.1)
Sodium: 141 mEq/L (ref 135–145)
Total Bilirubin: 0.4 mg/dL (ref 0.2–1.2)
Total Protein: 6.4 g/dL (ref 6.0–8.3)

## 2022-10-16 LAB — LIPASE: Lipase: 46 U/L (ref 11.0–59.0)

## 2022-10-16 LAB — CBC
HCT: 37.2 % (ref 36.0–46.0)
Hemoglobin: 12.4 g/dL (ref 12.0–15.0)
MCHC: 33.4 g/dL (ref 30.0–36.0)
MCV: 98.4 fl (ref 78.0–100.0)
Platelets: 215 10*3/uL (ref 150.0–400.0)
RBC: 3.78 Mil/uL — ABNORMAL LOW (ref 3.87–5.11)
RDW: 14.2 % (ref 11.5–15.5)
WBC: 5.3 10*3/uL (ref 4.0–10.5)

## 2022-10-16 LAB — TSH: TSH: 1.37 u[IU]/mL (ref 0.35–5.50)

## 2022-10-16 LAB — MICROALBUMIN / CREATININE URINE RATIO
Creatinine,U: 47.6 mg/dL
Microalb Creat Ratio: 1.5 mg/g (ref 0.0–30.0)
Microalb, Ur: <0.7 mg/dL (ref 0.0–1.9)

## 2022-10-17 ENCOUNTER — Encounter: Payer: Self-pay | Admitting: Family Medicine

## 2022-10-18 NOTE — Progress Notes (Signed)
Please inform patient of the following:  Her kidney numbers are up a bit since last time.  Looks like she is probably dehydrated.  Recommend that she try to get plenty of fluids and have her come back in 1 to 2 weeks to recheck.  Please place future order for c-Met. Please place future order for microscopic urinalysis as well.  Her lipase is normal.  No other obvious explanation of her epigastric pain.  Recommend she continue her Protonix and follow-up with GI soon to discuss her reflux and esophagitis.

## 2022-10-27 ENCOUNTER — Other Ambulatory Visit: Payer: Self-pay | Admitting: Physician Assistant

## 2022-10-27 ENCOUNTER — Other Ambulatory Visit: Payer: Self-pay | Admitting: Rheumatology

## 2022-10-29 ENCOUNTER — Other Ambulatory Visit: Payer: Self-pay | Admitting: *Deleted

## 2022-10-29 DIAGNOSIS — R899 Unspecified abnormal finding in specimens from other organs, systems and tissues: Secondary | ICD-10-CM

## 2022-10-29 NOTE — Telephone Encounter (Signed)
Next Visit: 12/13/2022   Last Visit: 06/07/2022   Last Fill: 09/11/2022   Dx: DDD (degenerative disc disease), lumbar    Current Dose per office note on 06/07/2022: not discussed   Okay to refill Lidocaine patches?

## 2022-10-29 NOTE — Telephone Encounter (Signed)
Next Visit: 12/13/2022   Last Visit: 06/07/2022   Last Fill: 04/02/2022  Dx: Inflammatory arthritis   Current Dose per office note on 06/07/2022: colchicine 0.6 mg 1 capsule by mouth daily   Labs: 10/15/2022 RBC 3.78, Glucose 106, BUN 25, Creat. 1.33, GFR 41.13, Alk. Phos 122, ALT 40   Okay to refill Colchicine?

## 2022-11-12 HISTORY — PX: UPPER GI ENDOSCOPY: SHX6162

## 2022-11-14 ENCOUNTER — Other Ambulatory Visit: Payer: Medicare Other

## 2022-11-20 ENCOUNTER — Ambulatory Visit (INDEPENDENT_AMBULATORY_CARE_PROVIDER_SITE_OTHER): Payer: Medicare Other | Admitting: Family Medicine

## 2022-11-20 VITALS — BP 118/77 | HR 79 | Temp 97.3°F | Ht 71.0 in | Wt 178.0 lb

## 2022-11-20 DIAGNOSIS — E890 Postprocedural hypothyroidism: Secondary | ICD-10-CM

## 2022-11-20 DIAGNOSIS — R899 Unspecified abnormal finding in specimens from other organs, systems and tissues: Secondary | ICD-10-CM | POA: Diagnosis not present

## 2022-11-20 DIAGNOSIS — J029 Acute pharyngitis, unspecified: Secondary | ICD-10-CM

## 2022-11-20 DIAGNOSIS — Z794 Long term (current) use of insulin: Secondary | ICD-10-CM

## 2022-11-20 DIAGNOSIS — E1151 Type 2 diabetes mellitus with diabetic peripheral angiopathy without gangrene: Secondary | ICD-10-CM | POA: Diagnosis not present

## 2022-11-20 LAB — POCT RAPID STREP A (OFFICE): Rapid Strep A Screen: POSITIVE — AB

## 2022-11-20 LAB — POCT INFLUENZA A/B
Influenza A, POC: NEGATIVE
Influenza B, POC: NEGATIVE

## 2022-11-20 LAB — POC COVID19 BINAXNOW: SARS Coronavirus 2 Ag: NEGATIVE

## 2022-11-20 MED ORDER — BENZONATATE 200 MG PO CAPS: 200.0000 mg | ORAL_CAPSULE | Freq: Two times a day (BID) | ORAL | 0 refills | Status: DC | PRN

## 2022-11-20 MED ORDER — AMOXICILLIN-POT CLAVULANATE 875-125 MG PO TABS: 1.0000 | ORAL_TABLET | Freq: Two times a day (BID) | ORAL | 0 refills | Status: DC

## 2022-11-20 NOTE — Patient Instructions (Signed)
It was very nice to see you today!  Your strep test is positive.  You may have a sinus infection as well.  I do not see any signs of bronchitis or pneumonia on your exam today.  Please start the Augmentin.  Take the Tessalon.  Let me know if not improving.  Take care, Dr Jerline Pain  PLEASE NOTE:  If you had any lab tests, please let us know if you have not heard back within a few days. You may see your results on mychart before we have a chance to review them but we will give you a call once they are reviewed by [place].   If we ordered any referrals today, please let us know if you have not heard from their office within the next week.   If you had any urgent prescriptions sent in today, please check with the pharmacy within an hour of our visit to make sure the prescription was transmitted appropriately.   Please try these tips to maintain a healthy lifestyle:  Eat at least 3 REAL meals and 1-2 snacks per day.  Aim for no more than 5 hours between eating.  If you eat breakfast, please do so within one hour of getting up.   Each meal should contain half fruits/vegetables, one quarter protein, and one quarter carbs (no bigger than a computer mouse)  Cut down on sweet beverages. This includes juice, soda, and sweet tea.   Drink at least 1 glass of water with each meal and aim for at least 8 glasses per day  Exercise at least 150 minutes every week.

## 2022-11-20 NOTE — Progress Notes (Signed)
   Savannah [name] is a 67 y.o. female who presents today for an office visit.  Assessment/Plan:  New/Acute Problems: Strep pharyngitis Rapid strep positive.  COVID and flu test negative.  May have some underlying sinusitis as well.  Given constellation of symptoms will start Augmentin.  Also start Tessalon for cough which has worked well for her in the past.  Encouraged hydration.  She appears well with no signs of systemic illness-do not think we need to get x-ray or other imaging at this point.  We discussed reasons to return to care.  She will let me know if not improving.  Chronic Problems Addressed Today: Type 2 diabetes mellitus with diabetic peripheral angiopathy without gangrene, with long-term current use of insulin ([facility]) last A1c 7.5.  Continue current regimen with Trulicity, Invokana, and repaglinide. Increases risk for bacterial infection.   Postsurgical hypothyroidism Last TSH at goal on Synthroid 112 mcg daily.      Subjective:  HPI:  Patient here with cough and congestion.  Home COVID test have been negative x 3.  Symptoms have been going on for about a week. She has had a lot of rhinorrhea. Still some headache and throat irritation. Has a lot of congestion in her chest. Some fevers initially but this has subsided. No chills.        Objective:  Physical Exam: BP 118/77   Pulse 79   Temp (!) 97.3 F (36.3 C) (Temporal)   Ht '5\' 11"'$  (1.803 m)   Wt 178 lb (80.7 kg)   SpO2 98%   BMI 24.83 kg/m   Gen: No acute distress, resting comfortably HEENT: TMs with clear effusion.  OP erythematous.  Nasal mucosa erythematous and boggy CV: Regular rate and rhythm with no murmurs appreciated Pulm: Normal work of breathing, clear to auscultation bilaterally with no crackles, wheezes, or rhonchi Neuro: Grossly normal, moves all extremities Psych: Normal affect and thought content      Chester Romero M. Jerline Pain, MD 11/20/2022 3:22 PM

## 2022-11-20 NOTE — Assessment & Plan Note (Signed)
last A1c 7.5.  Continue current regimen with Trulicity, Invokana, and repaglinide. Increases risk for bacterial infection.

## 2022-11-20 NOTE — Assessment & Plan Note (Signed)
Last TSH at goal on Synthroid 112 mcg daily.

## 2022-11-21 LAB — COMPREHENSIVE METABOLIC PANEL
ALT: 42 U/L — ABNORMAL HIGH (ref 0–35)
AST: 30 U/L (ref 0–37)
Albumin: 3.9 g/dL (ref 3.5–5.2)
Alkaline Phosphatase: 110 U/L (ref 39–117)
BUN: 17 mg/dL (ref 6–23)
CO2: 24 mEq/L (ref 19–32)
Calcium: 8.9 mg/dL (ref 8.4–10.5)
Chloride: 110 mEq/L (ref 96–112)
Creatinine, Ser: 1.33 mg/dL — ABNORMAL HIGH (ref 0.40–1.20)
GFR: 41.1 mL/min — ABNORMAL LOW (ref >60.00)
Glucose, Bld: 111 mg/dL — ABNORMAL HIGH (ref 70–99)
Potassium: 4.7 mEq/L (ref 3.5–5.1)
Sodium: 143 mEq/L (ref 135–145)
Total Bilirubin: 0.3 mg/dL (ref 0.2–1.2)
Total Protein: 6.4 g/dL (ref 6.0–8.3)

## 2022-11-22 ENCOUNTER — Other Ambulatory Visit: Payer: Medicare Other

## 2022-11-22 NOTE — Progress Notes (Signed)
Please inform patient of the following:  Her kidney function is stable but it is up a bit compared to previous. This is not an urgent concern but recommend she come back in for another office visit to discuss next steps in management.

## 2022-11-23 DIAGNOSIS — N952 Postmenopausal atrophic vaginitis: Secondary | ICD-10-CM | POA: Diagnosis not present

## 2022-11-23 DIAGNOSIS — L900 Lichen sclerosus et atrophicus: Secondary | ICD-10-CM | POA: Diagnosis not present

## 2022-11-23 DIAGNOSIS — N7689 Other specified inflammation of vagina and vulva: Secondary | ICD-10-CM | POA: Diagnosis not present

## 2022-11-23 DIAGNOSIS — L309 Dermatitis, unspecified: Secondary | ICD-10-CM | POA: Diagnosis not present

## 2022-11-30 NOTE — Progress Notes (Deleted)
Office Visit Note  Patient: Savannah [name]             Date of Birth: 1956/06/22           MRN: [id number]             PCP: Savannah Barrack, MD Referring: Savannah Barrack, MD Visit Date: 12/13/2022 Occupation: '@GUAROCC'$ @  Subjective:  No chief complaint on file.   History of Present Illness: Savannah [name] is a 67 y.o. female ***     Activities of Daily Living:  Patient reports morning stiffness for *** {minute/hour:19697}.   Patient {ACTIONS;DENIES/REPORTS:21021675::"Denies"} nocturnal pain.  Difficulty dressing/grooming: {ACTIONS;DENIES/REPORTS:21021675::"Denies"} Difficulty climbing stairs: {ACTIONS;DENIES/REPORTS:21021675::"Denies"} Difficulty getting out of chair: {ACTIONS;DENIES/REPORTS:21021675::"Denies"} Difficulty using hands for taps, buttons, cutlery, and/or writing: {ACTIONS;DENIES/REPORTS:21021675::"Denies"}  No Rheumatology ROS completed.   PMFS History:  Patient Active Problem List   Diagnosis Date Noted  . Bilateral carpal tunnel syndrome 03/13/2022  . Leg edema 03/01/2022  . Nondisplaced comminuted fracture of left patella, initial encounter for closed fracture 08/01/2021  . Arthritis of carpometacarpal [facility] joint of right thumb 07/28/2021  . Primary osteoarthritis of right distal radioulnar joint 07/28/2021  . Arthritis of wrist, right, degenerative 07/28/2021  . Osteopenia 01/14/2021  . Sesamoiditis of left foot 11/09/2020  . Posterior tibial tendinitis of left lower extremity 11/09/2020  . Type 2 diabetes mellitus with diabetic peripheral angiopathy without gangrene, with long-term current use of insulin ([place]) 10/21/2020  . Iron deficiency anemia 03/14/2020  . Lesion of vulva 09/02/2019  . Menorrhagia 09/02/2019  . Neuropathy 09/02/2019  . Irregular bowel habits 06/25/2019  . Coronary artery disease involving native coronary artery of native heart without angina pectoris   . Temporomandibular joint disorder 12/05/2018  . Fatigue 12/05/2018  .  Inverted nipple 12/05/2018  . Senile purpura ([place]) 11/21/2018  . GERD with esophagitis 11/21/2018  . Pseudogout involving multiple joints 11/21/2018  . Lipoma 07/22/2017  . Lichen sclerosus 99991111  . H/O gastric bypass 11/16/2016  . Depression 11/16/2016  . Diabetic polyneuropathy associated with type 2 diabetes mellitus ([place]) 11/16/2016  . Uterine cancer [facility] s/p hysterectomy 1997 01/04/2012  . Postsurgical hypothyroidism 09/19/2010  . Diabetic neuropathy ([place]) 11/21/2007  . Allergic rhinitis 11/21/2007  . Dyslipidemia associated with type 2 diabetes mellitus ([place]) 07/08/2007    Past Medical History:  Diagnosis Date  . ABDOMINAL PAIN, CHRONIC 10/20/2009  . Acute cystitis 08/17/2009  . ALLERGIC RHINITIS 11/21/2007  . Allergy   . ASYMPTOMATIC POSTMENOPAUSAL STATUS 09/29/2008  . Bariatric surgery status 07/07/2010  . Bariatric surgery status 07/07/2010   Qualifier: Diagnosis of  By: Loanne Drilling MD, Jacelyn Pi   . Cancer ([place])    uterine  . Cataract   . Coronary artery disease   . DEPRESSION 11/21/2007  . DIABETES MELLITUS, TYPE II 07/08/2007  . DYSPNEA 05/20/2009  . Edema 05/20/2009  . Eosinophilic esophagitis 99991111  . FEVER UNSPECIFIED 10/20/2009  . FEVER, HX OF 03/09/2010  . GERD (gastroesophageal reflux disease)   . Headache(784.0) 02/06/2010  . Hepatomegaly 01/06/2008  . HYPERLIPIDEMIA 07/08/2007  . HYPERTENSION 07/08/2007  . LEUKOPENIA, MILD 09/29/2008  . OBESITY 01/06/2008  . OSTEOARTHRITIS 01/06/2008  . Other chronic nonalcoholic liver disease [id number]  . PERIPHERAL NEUROPATHY 11/21/2007  . Peripheral neuropathy   . Postsurgical hypothyroidism 09/19/2010  . SINUSITIS- ACUTE-NOS 11/21/2007  . TB SKIN TEST, POSITIVE 02/06/2010  . THYROID NODULE 03/09/2010    Family History  Problem Relation Age of Onset  . Diabetes Mother   . Neuropathy Mother   .  Diabetes Father   . Congestive Heart Failure Father   . Hypertension Father   . Myelodysplastic syndrome  Father   . Bladder Cancer Sister   . Multiple sclerosis Sister   . Breast cancer Sister        breast onset age 38  . Osteoporosis Sister   . Liver cancer Maternal Aunt   . Breast cancer Maternal Aunt   . Diabetes Maternal Aunt   . Colon cancer Maternal Uncle   . Diabetes Maternal Uncle   . Neuropathy Paternal Aunt   . Diabetes Maternal Grandmother   . Diabetes Paternal Grandmother    Past Surgical History:  Procedure Laterality Date  . ABDOMINAL HYSTERECTOMY    . BASAL CELL CARCINOMA EXCISION    . CARDIAC CATHETERIZATION    . CHOLECYSTECTOMY N/A 05/11/2021   Procedure: LAPAROSCOPIC CHOLECYSTECTOMY WITH POSSIBLE  INTRAOPERATIVE CHOLANGIOGRAM;  Surgeon: Johnathan Hausen, MD;  Location: [facility];  Service: General;  Laterality: N/A;  . COLONOSCOPY    . CORONARY ATHERECTOMY N/A 05/03/2022   Procedure: CORONARY ATHERECTOMY;  Surgeon: Early Osmond, MD;  Location: [place] [facility];  Service: Cardiovascular;  Laterality: N/A;  . CORONARY STENT INTERVENTION N/A 05/03/2022   Procedure: CORONARY STENT INTERVENTION;  Surgeon: Early Osmond, MD;  Location: [place] [facility];  Service: Cardiovascular;  Laterality: N/A;  lad  . EYE SURGERY Bilateral 08/2018, 09/2018  . FOOT SURGERY Left    10/2019  . INTRAVASCULAR IMAGING/OCT N/A 05/03/2022   Procedure: INTRAVASCULAR IMAGING/OCT;  Surgeon: Early Osmond, MD;  Location: [place] [facility];  Service: Cardiovascular;  Laterality: N/A;  . INTRAVASCULAR PRESSURE WIRE/FFR STUDY N/A 05/03/2022   Procedure: INTRAVASCULAR PRESSURE WIRE/FFR STUDY;  Surgeon: Early Osmond, MD;  Location: [place] [facility];  Service: Cardiovascular;  Laterality: N/A;  . KNEE ARTHROSCOPY Left   . LEFT HEART CATH AND CORONARY ANGIOGRAPHY N/A 01/20/2019   Procedure: LEFT HEART CATH AND CORONARY ANGIOGRAPHY;  Surgeon: Belva Crome, MD;  Location: [place] [facility];  Service: Cardiovascular;  Laterality: N/A;  . LYMPH NODE DISSECTION    . OOPHORECTOMY    . Right  total knee replacement    . RIGHT/LEFT HEART CATH AND CORONARY ANGIOGRAPHY N/A 05/03/2022   Procedure: RIGHT/LEFT HEART CATH AND CORONARY ANGIOGRAPHY;  Surgeon: Early Osmond, MD;  Location: [place] [facility];  Service: Cardiovascular;  Laterality: N/A;  . ROUX-EN-Y GASTRIC BYPASS  2011  . SKIN TAG REMOVAL    . THYROIDECTOMY    . TONSILLECTOMY AND ADENOIDECTOMY    . UPPER GASTROINTESTINAL ENDOSCOPY     Social History   Social History Narrative  . Not on file   Immunization History  Administered Date(s) Administered  . Covid-19, Mrna,Vaccine(Spikevax)73yr and older 10/06/2022  . Fluad Quad(high Dose 65+) 07/13/2019, 07/17/2022  . Influenza Inj Mdck Quad Pf 10/19/2017, 07/23/2018  . Influenza Whole 07/30/2008, 08/17/2009, 09/19/2010  . Influenza, High Dose Seasonal PF 08/15/2021  . Influenza,inj,Quad PF,6-35 Mos 07/14/2015  . Influenza-Unspecified 10/13/2011, 08/12/2013, 07/13/2017, 07/23/2018  . PFIZER(Purple Top)SARS-COV-2 Vaccination 12/21/2019, 01/14/2020, 08/18/2020  . PPension scheme manager125yr& up 08/15/2021  . Pneumococcal Conjugate-13 10/14/2015  . Pneumococcal Polysaccharide-23 06/25/2014, 10/21/2020  . Respiratory Syncytial Virus Vaccine,Recomb Aduvanted(Arexvy) 07/17/2022  . Tdap 05/20/2020  . Zoster Recombinat (Shingrix) 05/20/2020, 10/06/2022  . Zoster, Live 06/25/2014     Objective: Vital Signs: There were no vitals taken for this visit.   Physical Exam   Musculoskeletal Exam: ***  CDAI Exam: CDAI  Score: -- Patient Global: --; Provider Global: -- Swollen: --; Tender: -- Joint Exam 12/13/2022   No joint exam has been documented for this visit   There is currently no information documented on the homunculus. Go to the Rheumatology activity and complete the homunculus joint exam.  Investigation: No additional findings.  Imaging: No results found.  Recent Labs: Lab Results  Component Value Date   WBC 5.3 10/15/2022   HGB  12.4 10/15/2022   PLT 215.0 10/15/2022   NA 143 11/20/2022   K 4.7 11/20/2022   CL 110 11/20/2022   CO2 24 11/20/2022   GLUCOSE 111 (H) 11/20/2022   BUN 17 11/20/2022   CREATININE 1.33 (H) 11/20/2022   BILITOT 0.3 11/20/2022   ALKPHOS 110 11/20/2022   AST 30 11/20/2022   ALT 42 (H) 11/20/2022   PROT 6.4 11/20/2022   ALBUMIN 3.9 11/20/2022   CALCIUM 8.9 11/20/2022   GFRAA 73 08/16/2020    Speciality Comments: No specialty comments available.  Procedures:  No procedures performed Allergies: Ace inhibitors, Caffeine, Ciprofloxacin, Morphine and related, Mucinex [guaifenesin er], Nsaids, Other, Silicone, and Tape   Assessment / Plan:     Visit Diagnoses: No diagnosis found.  Orders: No orders of the defined types were placed in this encounter.  No orders of the defined types were placed in this encounter.   Face-to-face time spent with patient was *** minutes. Greater than 50% of time was spent in counseling and coordination of care.  Follow-Up Instructions: No follow-ups on file.   Earnestine Mealing, CMA  Note - This record has been created using Editor, commissioning.  Chart creation errors have been sought, but may not always  have been located. Such creation errors do not reflect on  the standard of medical care.

## 2022-12-03 DIAGNOSIS — G894 Chronic pain syndrome: Secondary | ICD-10-CM | POA: Diagnosis not present

## 2022-12-03 DIAGNOSIS — M47812 Spondylosis without myelopathy or radiculopathy, cervical region: Secondary | ICD-10-CM | POA: Diagnosis not present

## 2022-12-03 DIAGNOSIS — M47816 Spondylosis without myelopathy or radiculopathy, lumbar region: Secondary | ICD-10-CM | POA: Diagnosis not present

## 2022-12-03 DIAGNOSIS — M791 Myalgia, unspecified site: Secondary | ICD-10-CM | POA: Diagnosis not present

## 2022-12-03 NOTE — Progress Notes (Signed)
Office Visit Note  Patient: Savannah [name]             Date of Birth: [birth year]           MRN: [id number]             PCP: Vivi Barrack, MD Referring: Vivi Barrack, MD Visit Date: 12/04/2022 Occupation: '@GUAROCC'$ @  Subjective:  Pain in multiple joints  History of Present Illness: Savannah [name] is a 67 y.o. female with history of inflammatory arthritis, osteoarthritis, degenerative disc disease and osteoporosis.  She states she does well when she takes colchicine 0.6 mg p.o. daily.  The days she skipped colchicine she notices some inflammation in her joints.  Although she would prefer to stop colchicine.  She continues to have some discomfort in her knee joints.  She states the right total knee replacement continues to work.  She has off-and-on discomfort in her lower back.  Activities of Daily Living:  Patient reports morning stiffness for 15-20 minutes.   Patient Reports nocturnal pain.  Difficulty dressing/grooming: Denies Difficulty climbing stairs: Reports Difficulty getting out of chair: Reports Difficulty using hands for taps, buttons, cutlery, and/or writing: Reports  Review of Systems  Constitutional:  Positive for fatigue.  HENT:  Positive for mouth dryness. Negative for mouth sores.   Eyes:  Positive for dryness.  Respiratory:  Positive for shortness of breath.   Cardiovascular:  Negative for chest pain and palpitations.  Gastrointestinal:  Negative for blood in stool, constipation and diarrhea.  Endocrine: Negative for increased urination.  Genitourinary:  Negative for involuntary urination.  Musculoskeletal:  Positive for joint pain, joint pain, joint swelling and morning stiffness. Negative for gait problem, myalgias, muscle weakness, muscle tenderness and myalgias.  Skin:  Positive for color change. Negative for rash, hair loss and sensitivity to sunlight.  Allergic/Immunologic: Negative for susceptible to infections.  Neurological:  Positive for numbness.  Negative for dizziness and headaches.  Hematological:  Negative for swollen glands.  Psychiatric/Behavioral:  Negative for depressed mood and sleep disturbance. The patient is not nervous/anxious.     PMFS History:  Patient Active Problem List   Diagnosis Date Noted   Bilateral carpal tunnel syndrome 03/13/2022   Leg edema 03/01/2022   Nondisplaced comminuted fracture of left patella, initial encounter for closed fracture 08/01/2021   Arthritis of carpometacarpal (CMC) joint of right thumb 07/28/2021   Primary osteoarthritis of right distal radioulnar joint 07/28/2021   Arthritis of wrist, right, degenerative 07/28/2021   Osteopenia 01/14/2021   Sesamoiditis of left foot 11/09/2020   Posterior tibial tendinitis of left lower extremity 11/09/2020   Type 2 diabetes mellitus with diabetic peripheral angiopathy without gangrene, with long-term current use of insulin ([place]) 10/21/2020   Iron deficiency anemia 03/14/2020   Lesion of vulva 09/02/2019   Menorrhagia 09/02/2019   Neuropathy 09/02/2019   Irregular bowel habits 06/25/2019   Coronary artery disease involving native coronary artery of native heart without angina pectoris    Temporomandibular joint disorder 12/05/2018   Fatigue 12/05/2018   Inverted nipple 12/05/2018   Senile purpura ([place]) 11/21/2018   GERD with esophagitis 11/21/2018   Pseudogout involving multiple joints 11/21/2018   Lipoma 45/80/9983   Lichen sclerosus 38/25/0539   H/O gastric bypass 11/16/2016   Depression 11/16/2016   Diabetic polyneuropathy associated with type 2 diabetes mellitus ([place]) 11/16/2016   Uterine cancer ([place]) s/p hysterectomy 1997 01/04/2012   Postsurgical hypothyroidism 09/19/2010   Diabetic neuropathy ([place]) 11/21/2007   Allergic rhinitis 11/21/2007  Dyslipidemia associated with type 2 diabetes mellitus ([place]) 07/08/2007    Past Medical History:  Diagnosis Date   ABDOMINAL PAIN, CHRONIC 10/20/2009   Acute cystitis 08/17/2009   ALLERGIC  RHINITIS 11/21/2007   Allergy    ASYMPTOMATIC POSTMENOPAUSAL STATUS 09/29/2008   Bariatric surgery status 07/07/2010   Bariatric surgery status 07/07/2010   Qualifier: Diagnosis of  By: Loanne Drilling MD, Sean A    Cancer [facility])    uterine   Cataract    Coronary artery disease    DEPRESSION 11/21/2007   DIABETES MELLITUS, TYPE II 07/08/2007   DYSPNEA 05/20/2009   Edema 23/53/6144   Eosinophilic esophagitis 31/54/0086   FEVER UNSPECIFIED 10/20/2009   FEVER, HX OF 03/09/2010   GERD (gastroesophageal reflux disease)    Headache(784.0) 02/06/2010   Hepatomegaly 01/06/2008   HYPERLIPIDEMIA 07/08/2007   HYPERTENSION 07/08/2007   LEUKOPENIA, MILD 09/29/2008   OBESITY 01/06/2008   OSTEOARTHRITIS 01/06/2008   Other chronic nonalcoholic liver disease 76/19/5093   PERIPHERAL NEUROPATHY 11/21/2007   Peripheral neuropathy    Postsurgical hypothyroidism 09/19/2010   SINUSITIS- ACUTE-NOS 11/21/2007   TB SKIN TEST, POSITIVE 02/06/2010   THYROID NODULE 03/09/2010    Family History  Problem Relation Age of Onset   Diabetes Mother    Neuropathy Mother    Diabetes Father    Congestive Heart Failure Father    Hypertension Father    Myelodysplastic syndrome Father    Bladder Cancer Sister    Multiple sclerosis Sister    Breast cancer Sister        breast onset age 62   Osteoporosis Sister    Liver cancer Maternal Aunt    Breast cancer Maternal Aunt    Diabetes Maternal Aunt    Colon cancer Maternal Uncle    Diabetes Maternal Uncle    Neuropathy Paternal Aunt    Diabetes Maternal Grandmother    Diabetes Paternal Grandmother    Past Surgical History:  Procedure Laterality Date   ABDOMINAL HYSTERECTOMY     BASAL CELL CARCINOMA EXCISION     CARDIAC CATHETERIZATION     CHOLECYSTECTOMY N/A 05/11/2021   Procedure: LAPAROSCOPIC CHOLECYSTECTOMY WITH POSSIBLE  INTRAOPERATIVE CHOLANGIOGRAM;  Surgeon: Johnathan Hausen, MD;  Location: [facility];  Service: General;  Laterality: N/A;   COLONOSCOPY      CORONARY ATHERECTOMY N/A 05/03/2022   Procedure: CORONARY ATHERECTOMY;  Surgeon: Early Osmond, MD;  Location: [place] [facility];  Service: Cardiovascular;  Laterality: N/A;   CORONARY STENT INTERVENTION N/A 05/03/2022   Procedure: CORONARY STENT INTERVENTION;  Surgeon: Early Osmond, MD;  Location: [place] [facility];  Service: Cardiovascular;  Laterality: N/A;  lad   EYE SURGERY Bilateral 08/2018, 09/2018   FOOT SURGERY Left    10/2019   INTRAVASCULAR IMAGING/OCT N/A 05/03/2022   Procedure: INTRAVASCULAR IMAGING/OCT;  Surgeon: Early Osmond, MD;  Location: [place] [facility];  Service: Cardiovascular;  Laterality: N/A;   INTRAVASCULAR PRESSURE WIRE/FFR STUDY N/A 05/03/2022   Procedure: INTRAVASCULAR PRESSURE WIRE/FFR STUDY;  Surgeon: Early Osmond, MD;  Location: [place] [facility];  Service: Cardiovascular;  Laterality: N/A;   KNEE ARTHROSCOPY Left    LEFT HEART CATH AND CORONARY ANGIOGRAPHY N/A 01/20/2019   Procedure: LEFT HEART CATH AND CORONARY ANGIOGRAPHY;  Surgeon: Belva Crome, MD;  Location: [place] [facility];  Service: Cardiovascular;  Laterality: N/A;   LYMPH NODE DISSECTION     OOPHORECTOMY     Right total knee replacement     RIGHT/LEFT HEART CATH AND  CORONARY ANGIOGRAPHY N/A 05/03/2022   Procedure: RIGHT/LEFT HEART CATH AND CORONARY ANGIOGRAPHY;  Surgeon: Early Osmond, MD;  Location: [place] [facility];  Service: Cardiovascular;  Laterality: N/A;   ROUX-EN-Y GASTRIC BYPASS  2011   SKIN TAG REMOVAL     THYROIDECTOMY     TONSILLECTOMY AND ADENOIDECTOMY     TOOTH EXTRACTION  2023   UPPER GASTROINTESTINAL ENDOSCOPY     Social History   Social History Narrative   Not on file   Immunization History  Administered Date(s) Administered   Covid-19, Mrna,Vaccine(Spikevax)67yr and older 10/06/2022   Fluad Quad(high Dose 65+) 07/13/2019, 07/17/2022   Influenza Inj Mdck Quad Pf 10/19/2017, 07/23/2018   Influenza Whole 07/30/2008, 08/17/2009, 09/19/2010    Influenza, High Dose Seasonal PF 08/15/2021   Influenza,inj,Quad PF,6-35 Mos 07/14/2015   Influenza-Unspecified 10/13/2011, 08/12/2013, 07/13/2017, 07/23/2018   PFIZER(Purple Top)SARS-COV-2 Vaccination 12/21/2019, 01/14/2020, 08/18/2020   Pfizer Covid-19 Vaccine Bivalent Booster 121yr& up 08/15/2021   Pneumococcal Conjugate-13 10/14/2015   Pneumococcal Polysaccharide-23 06/25/2014, 10/21/2020   Respiratory Syncytial Virus Vaccine,Recomb Aduvanted(Arexvy) 07/17/2022   Tdap 05/20/2020   Zoster Recombinat (Shingrix) 05/20/2020, 10/06/2022   Zoster, Live 06/25/2014     Objective: Vital Signs: BP 113/74 (BP Location: Left Arm, Patient Position: Sitting, Cuff Size: Normal)   Pulse 73   Ht '5\' 11"'$  (1.803 m)   Wt 177 lb (80.3 kg)   BMI 24.69 kg/m    Physical Exam Vitals and nursing note reviewed.  Constitutional:      Appearance: She is well-developed.  HENT:     Head: Normocephalic and atraumatic.  Eyes:     Conjunctiva/sclera: Conjunctivae normal.  Cardiovascular:     Rate and Rhythm: Normal rate and regular rhythm.     Heart sounds: Normal heart sounds.  Pulmonary:     Effort: Pulmonary effort is normal.     Breath sounds: Normal breath sounds.  Abdominal:     General: Bowel sounds are normal.     Palpations: Abdomen is soft.  Musculoskeletal:     Cervical back: Normal range of motion.  Lymphadenopathy:     Cervical: No cervical adenopathy.  Skin:    General: Skin is warm and dry.     Capillary Refill: Capillary refill takes less than 2 seconds.  Neurological:     Mental Status: She is alert and oriented to person, place, and time.  Psychiatric:        Behavior: Behavior normal.      Musculoskeletal Exam: Cervical spine was in limited range of motion without discomfort.  She had good range of motion of her lumbar spine with some discomfort.  Shoulder joints, elbow joints, wrist joints were in good range of motion.  She had Dupuytren contractures in bilateral hands.  PIP  and DIP thickening with incomplete fist formation was noted.  No synovitis was noted.  She had good range of motion of her hip joints.  Right knee joint was replaced without any swelling.  Left knee joint had discomfort with range of motion without any warmth swelling or effusion.  There was no tenderness over ankles or MTPs.  CDAI Exam: CDAI Score: -- Patient Global: --; Provider Global: -- Swollen: --; Tender: -- Joint Exam 12/04/2022   No joint exam has been documented for this visit   There is currently no information documented on the homunculus. Go to the Rheumatology activity and complete the homunculus joint exam.  Investigation: No additional findings.  Imaging: No results found.  Recent Labs: Lab Results  Component Value Date   WBC 5.3 10/15/2022   HGB 12.4 10/15/2022   PLT 215.0 10/15/2022   NA 143 11/20/2022   K 4.7 11/20/2022   CL 110 11/20/2022   CO2 24 11/20/2022   GLUCOSE 111 (H) 11/20/2022   BUN 17 11/20/2022   CREATININE 1.33 (H) 11/20/2022   BILITOT 0.3 11/20/2022   ALKPHOS 110 11/20/2022   AST 30 11/20/2022   ALT 42 (H) 11/20/2022   PROT 6.4 11/20/2022   ALBUMIN 3.9 11/20/2022   CALCIUM 8.9 11/20/2022   GFRAA 73 08/16/2020    Speciality Comments: No specialty comments available.  Procedures:  No procedures performed Allergies: Ace inhibitors, Caffeine, Ciprofloxacin, Morphine and related, Mucinex [guaifenesin er], Nsaids, Other, Silicone, and Tape   Assessment / Plan:     Visit Diagnoses: Inflammatory arthritis - RF-, anti-CCP-, 14-3-3 eta-, ANA-, HLAB27-, uric acid 3.8. Responsive to colchicine-likely she has pseudogout: She wants to stop colchicine.  I advised her to take colchicine 0.6 mg p.o. daily on as needed basis.  She states she notices increased swelling when she stops colchicine.  She has been taking 1 tablet on a daily basis.  Medication monitoring encounter - Colchicine 0.6 mg 1 capsule by mouth daily.  Labs from November 20, 2021  showed elevated creatinine 1.33.  ALT was elevated at 42.  CBC was normal on October 15, 2022.  Primary osteoarthritis of both hands-she has severe osteoarthritis with bilateral PIP and DIP thickening and incomplete fist formation.  She has limited extension of several of the DIP joints and PIP joints.  Joint protection muscle strengthening was discussed.  A handout on hand exercises was given.  Trochanteric bursitis of both hips-she continues to have some tenderness over bilateral trochanteric bursa.  She had good range of motion of her hip joints.  A handout on IT band stretches was given.  Status post total right knee replacement - Performed by Dr. Veverly Fells.  She has leg length discrepancy.  Patient states she has tried shoe inserts which were cumbersome.  Primary osteoarthritis of left knee -she continues to have pain and discomfort in her left knee joint.  She is followed by Dr.Xu.  Primary osteoarthritis of both feet-proper fitting shoes were advised.  DDD (degenerative disc disease), cervical-she had limited range of motion of the cervical spine and chronically stiffness.  DDD (degenerative disc disease), lumbar -she continues to have lower back pain.  Core strengthening exercises were discussed.  MRI done by Dr. Jaynee Eagles from July 07, 2021 showed degenerative changes and facet joint arthropathy.  Age-related osteoporosis without current pathological fracture - DEXA updated on 01/11/21: Right femoral neck- T-score -2.5 and a -25.9% change in BMD from previous DEXA.  I did detailed discussion with patient regarding different treatment options.  Patient states she has reviewed most options including bisphosphonates and Prolia.  She is not interested in taking any of the medications because of their side effects.  Vitamin D deficiency-vitamin D was normal in 2019.  She takes vitamin D.  Other medical problems are listed as follows:  NASH (nonalcoholic steatohepatitis)  History of diabetes  mellitus, type II  History of peripheral neuropathy - gabapentin 300 mg 4 capsules by mouth daily for management of neuropathy.  Positive PPD  Postsurgical hypothyroidism  History of hyperlipidemia  History of uterine cancer  History of gastroesophageal reflux (GERD)  Status post gastric bypass for obesity  Status post cholecystectomy  Eosinophilic esophagitis  Orders: No orders of the defined types were placed in this  encounter.  No orders of the defined types were placed in this encounter.    Follow-Up Instructions: Return in about 6 months (around 06/04/2023).   Bo Merino, MD  Note - This record has been created using Editor, commissioning.  Chart creation errors have been sought, but may not always  have been located. Such creation errors do not reflect on  the standard of medical care.

## 2022-12-04 ENCOUNTER — Ambulatory Visit: Payer: Medicare Other | Attending: Rheumatology | Admitting: Rheumatology

## 2022-12-04 ENCOUNTER — Encounter: Payer: Self-pay | Admitting: Rheumatology

## 2022-12-04 VITALS — BP 113/74 | HR 73 | Ht 71.0 in | Wt 177.0 lb

## 2022-12-04 DIAGNOSIS — M7062 Trochanteric bursitis, left hip: Secondary | ICD-10-CM

## 2022-12-04 DIAGNOSIS — M19071 Primary osteoarthritis, right ankle and foot: Secondary | ICD-10-CM

## 2022-12-04 DIAGNOSIS — Z8719 Personal history of other diseases of the digestive system: Secondary | ICD-10-CM | POA: Diagnosis not present

## 2022-12-04 DIAGNOSIS — Z5181 Encounter for therapeutic drug level monitoring: Secondary | ICD-10-CM

## 2022-12-04 DIAGNOSIS — Z9049 Acquired absence of other specified parts of digestive tract: Secondary | ICD-10-CM

## 2022-12-04 DIAGNOSIS — M19041 Primary osteoarthritis, right hand: Secondary | ICD-10-CM

## 2022-12-04 DIAGNOSIS — Z9884 Bariatric surgery status: Secondary | ICD-10-CM | POA: Diagnosis not present

## 2022-12-04 DIAGNOSIS — M19042 Primary osteoarthritis, left hand: Secondary | ICD-10-CM

## 2022-12-04 DIAGNOSIS — E559 Vitamin D deficiency, unspecified: Secondary | ICD-10-CM

## 2022-12-04 DIAGNOSIS — M5030 Other cervical disc degeneration, unspecified cervical region: Secondary | ICD-10-CM | POA: Diagnosis not present

## 2022-12-04 DIAGNOSIS — E890 Postprocedural hypothyroidism: Secondary | ICD-10-CM | POA: Diagnosis not present

## 2022-12-04 DIAGNOSIS — M19072 Primary osteoarthritis, left ankle and foot: Secondary | ICD-10-CM | POA: Diagnosis not present

## 2022-12-04 DIAGNOSIS — K7581 Nonalcoholic steatohepatitis (NASH): Secondary | ICD-10-CM | POA: Diagnosis not present

## 2022-12-04 DIAGNOSIS — M810 Age-related osteoporosis without current pathological fracture: Secondary | ICD-10-CM

## 2022-12-04 DIAGNOSIS — Z8639 Personal history of other endocrine, nutritional and metabolic disease: Secondary | ICD-10-CM | POA: Diagnosis not present

## 2022-12-04 DIAGNOSIS — M1990 Unspecified osteoarthritis, unspecified site: Secondary | ICD-10-CM

## 2022-12-04 DIAGNOSIS — Z8542 Personal history of malignant neoplasm of other parts of uterus: Secondary | ICD-10-CM

## 2022-12-04 DIAGNOSIS — M1712 Unilateral primary osteoarthritis, left knee: Secondary | ICD-10-CM | POA: Diagnosis not present

## 2022-12-04 DIAGNOSIS — M5136 Other intervertebral disc degeneration, lumbar region: Secondary | ICD-10-CM

## 2022-12-04 DIAGNOSIS — M7061 Trochanteric bursitis, right hip: Secondary | ICD-10-CM | POA: Diagnosis not present

## 2022-12-04 DIAGNOSIS — R7611 Nonspecific reaction to tuberculin skin test without active tuberculosis: Secondary | ICD-10-CM | POA: Diagnosis not present

## 2022-12-04 DIAGNOSIS — K200 Eosinophilic esophagitis: Secondary | ICD-10-CM | POA: Diagnosis not present

## 2022-12-04 DIAGNOSIS — Z96651 Presence of right artificial knee joint: Secondary | ICD-10-CM | POA: Diagnosis not present

## 2022-12-04 DIAGNOSIS — Z8669 Personal history of other diseases of the nervous system and sense organs: Secondary | ICD-10-CM | POA: Diagnosis not present

## 2022-12-04 NOTE — Patient Instructions (Signed)
Iliotibial Band Syndrome Rehab Ask your health care provider which exercises are safe for you. Do exercises exactly as told by your health care provider and adjust them as directed. It is normal to feel mild stretching, pulling, tightness, or discomfort as you do these exercises. Stop right away if you feel sudden pain or your pain gets significantly worse. Do not begin these exercises until told by your health care provider. Stretching and range-of-motion exercises These exercises warm up your muscles and joints and improve the movement and flexibility of your hip and pelvis. Quadriceps stretch, prone  Lie on your abdomen (prone position) on a firm surface, such as a bed or padded floor. Bend your left / right knee and reach back to hold your ankle or pant leg. If you cannot reach your ankle or pant leg, loop a belt around your foot and grab the belt instead. Gently pull your heel toward your buttocks. Your knee should not slide out to the side. You should feel a stretch in the front of your thigh and knee (quadriceps). Hold this position for __________ seconds. Repeat __________ times. Complete this exercise __________ times a day. Iliotibial band stretch An iliotibial band is a strong band of muscle tissue that runs from the outer side of your hip to the outer side of your thigh and knee. Lie on your side with your left / right leg in the top position. Bend both of your knees and grab your left / right ankle. Stretch out your bottom arm to help you balance. Slowly bring your top knee back so your thigh goes behind your trunk. Slowly lower your top leg toward the floor until you feel a gentle stretch on the outside of your left / right hip and thigh. If you do not feel a stretch and your knee will not fall farther, place the heel of your other foot on top of your knee and pull your knee down toward the floor with your foot. Hold this position for __________ seconds. Repeat __________ times.  Complete this exercise __________ times a day. Strengthening exercises These exercises build strength and endurance in your hip and pelvis. Endurance is the ability to use your muscles for a long time, even after they get tired. Straight leg raises, side-lying This exercise strengthens the muscles that rotate the leg at the hip and move it away from your body (hip abductors). Lie on your side with your left / right leg in the top position. Lie so your head, shoulder, hip, and knee line up. You may bend your bottom knee to help you balance. Roll your hips slightly forward so your hips are stacked directly over each other and your left / right knee is facing forward. Tense the muscles in your outer thigh and lift your top leg 4-6 inches (10-15 cm). Hold this position for __________ seconds. Slowly lower your leg to return to the starting position. Let your muscles relax completely before doing another repetition. Repeat __________ times. Complete this exercise __________ times a day. Leg raises, prone This exercise strengthens the muscles that move the hips backward (hip extensors). Lie on your abdomen (prone position) on your bed or a firm surface. You can put a pillow under your hips if that is more comfortable for your lower back. Bend your left / right knee so your foot is straight up in the air. Squeeze your buttocks muscles and lift your left / right thigh off the bed. Do not let your back arch. Tense   your thigh muscle as hard as you can without increasing any knee pain. Hold this position for __________ seconds. Slowly lower your leg to return to the starting position and allow it to relax completely. Repeat __________ times. Complete this exercise __________ times a day. Hip hike Stand sideways on a bottom step. Stand on your left / right leg with your other foot unsupported next to the step. You can hold on to a railing or wall for balance if needed. Keep your knees straight and your  torso square. Then lift your left / right hip up toward the ceiling. Slowly let your left / right hip lower toward the floor, past the starting position. Your foot should get closer to the floor. Do not lean or bend your knees. Repeat __________ times. Complete this exercise __________ times a day. This information is not intended to replace advice given to you by your health care provider. Make sure you discuss any questions you have with your health care provider. Document Revised: 01/06/2020 Document Reviewed: 01/06/2020 Elsevier Patient Education  [place] Exercises Hand exercises can be helpful for almost anyone. These exercises can strengthen the hands, improve flexibility and movement, and increase blood flow to the hands. These results can make work and daily tasks easier. Hand exercises can be especially helpful for people who have joint pain from arthritis or have nerve damage from overuse (carpal tunnel syndrome). These exercises can also help people who have injured a hand. Exercises Most of these hand exercises are gentle stretching and motion exercises. It is usually safe to do them often throughout the day. Warming up your hands before exercise may help to reduce stiffness. You can do this with gentle massage or by placing your hands in warm water for 10-15 minutes. It is normal to feel some stretching, pulling, tightness, or mild discomfort as you begin new exercises. This will gradually improve. Stop an exercise right away if you feel sudden, severe pain or your pain gets worse. Ask your health care provider which exercises are best for you. Knuckle bend or "claw" fist  Stand or sit with your arm, hand, and all five fingers pointed straight up. Make sure to keep your wrist straight during the exercise. Gently bend your fingers down toward your palm until the tips of your fingers are touching the top of your palm. Keep your big knuckle straight and just bend the small  knuckles in your fingers. Hold this position for __________ seconds. Straighten (extend) your fingers back to the starting position. Repeat this exercise 5-10 times with each hand. Full finger fist  Stand or sit with your arm, hand, and all five fingers pointed straight up. Make sure to keep your wrist straight during the exercise. Gently bend your fingers into your palm until the tips of your fingers are touching the middle of your palm. Hold this position for __________ seconds. Extend your fingers back to the starting position, stretching every joint fully. Repeat this exercise 5-10 times with each hand. Straight fist Stand or sit with your arm, hand, and all five fingers pointed straight up. Make sure to keep your wrist straight during the exercise. Gently bend your fingers at the big knuckle, where your fingers meet your hand, and the middle knuckle. Keep the knuckle at the tips of your fingers straight and try to touch the bottom of your palm. Hold this position for __________ seconds. Extend your fingers back to the starting position, stretching every joint fully. Repeat this  exercise 5-10 times with each hand. Tabletop  Stand or sit with your arm, hand, and all five fingers pointed straight up. Make sure to keep your wrist straight during the exercise. Gently bend your fingers at the big knuckle, where your fingers meet your hand, as far down as you can while keeping the small knuckles in your fingers straight. Think of forming a tabletop with your fingers. Hold this position for __________ seconds. Extend your fingers back to the starting position, stretching every joint fully. Repeat this exercise 5-10 times with each hand. Finger spread  Place your hand flat on a table with your palm facing down. Make sure your wrist stays straight as you do this exercise. Spread your fingers and thumb apart from each other as far as you can until you feel a gentle stretch. Hold this position  for __________ seconds. Bring your fingers and thumb tight together again. Hold this position for __________ seconds. Repeat this exercise 5-10 times with each hand. Making circles  Stand or sit with your arm, hand, and all five fingers pointed straight up. Make sure to keep your wrist straight during the exercise. Make a circle by touching the tip of your thumb to the tip of your index finger. Hold for __________ seconds. Then open your hand wide. Repeat this motion with your thumb and each finger on your hand. Repeat this exercise 5-10 times with each hand. Thumb motion  Sit with your forearm resting on a table and your wrist straight. Your thumb should be facing up toward the ceiling. Keep your fingers relaxed as you move your thumb. Lift your thumb up as high as you can toward the ceiling. Hold for __________ seconds. Bend your thumb across your palm as far as you can, reaching the tip of your thumb for the small finger (pinkie) side of your palm. Hold for __________ seconds. Repeat this exercise 5-10 times with each hand. Grip strengthening  Hold a stress ball or other soft ball in the middle of your hand. Slowly increase the pressure, squeezing the ball as much as you can without causing pain. Think of bringing the tips of your fingers into the middle of your palm. All of your finger joints should bend when doing this exercise. Hold your squeeze for __________ seconds, then relax. Repeat this exercise 5-10 times with each hand. Contact a health care provider if: Your hand pain or discomfort gets much worse when you do an exercise. Your hand pain or discomfort does not improve within 2 hours after you exercise. If you have any of these problems, stop doing these exercises right away. Do not do them again unless your health care provider says that you can. Get help right away if: You develop sudden, severe hand pain or swelling. If this happens, stop doing these exercises right away.  Do not do them again unless your health care provider says that you can. This information is not intended to replace advice given to you by your health care provider. Make sure you discuss any questions you have with your health care provider. Document Revised: 02/16/2021 Document Reviewed: 02/16/2021 Elsevier Patient Education  [place].

## 2022-12-13 ENCOUNTER — Ambulatory Visit: Payer: Medicare Other | Admitting: Rheumatology

## 2022-12-16 ENCOUNTER — Other Ambulatory Visit: Payer: Self-pay | Admitting: Physician Assistant

## 2022-12-17 DIAGNOSIS — N3001 Acute cystitis with hematuria: Secondary | ICD-10-CM | POA: Diagnosis not present

## 2022-12-17 DIAGNOSIS — R300 Dysuria: Secondary | ICD-10-CM | POA: Diagnosis not present

## 2022-12-17 DIAGNOSIS — N281 Cyst of kidney, acquired: Secondary | ICD-10-CM | POA: Diagnosis not present

## 2022-12-17 DIAGNOSIS — R3915 Urgency of urination: Secondary | ICD-10-CM | POA: Diagnosis not present

## 2022-12-17 DIAGNOSIS — R319 Hematuria, unspecified: Secondary | ICD-10-CM | POA: Diagnosis not present

## 2022-12-17 DIAGNOSIS — R310 Gross hematuria: Secondary | ICD-10-CM | POA: Diagnosis not present

## 2022-12-31 ENCOUNTER — Other Ambulatory Visit: Payer: Self-pay | Admitting: Family Medicine

## 2023-01-15 ENCOUNTER — Other Ambulatory Visit: Payer: Self-pay | Admitting: Physician Assistant

## 2023-01-15 NOTE — Telephone Encounter (Signed)
Next Visit: 06/03/2023  Last Visit: 12/04/2022  Last Fill: 10/29/2022  Dx: DDD (degenerative disc disease), lumbar   Current Dose per office note on 12/04/2022: not discussed  Okay to refill Lidoderm patches?

## 2023-02-07 DIAGNOSIS — Z124 Encounter for screening for malignant neoplasm of cervix: Secondary | ICD-10-CM | POA: Diagnosis not present

## 2023-02-07 LAB — HM PAP SMEAR: HM Pap smear: NEGATIVE

## 2023-02-14 ENCOUNTER — Other Ambulatory Visit: Payer: Self-pay | Admitting: Family Medicine

## 2023-02-18 ENCOUNTER — Encounter: Payer: Self-pay | Admitting: Family Medicine

## 2023-02-22 ENCOUNTER — Encounter: Payer: Self-pay | Admitting: Internal Medicine

## 2023-02-22 ENCOUNTER — Encounter: Payer: Self-pay | Admitting: Family Medicine

## 2023-02-22 ENCOUNTER — Ambulatory Visit (INDEPENDENT_AMBULATORY_CARE_PROVIDER_SITE_OTHER): Payer: Medicare Other | Admitting: Family Medicine

## 2023-02-22 VITALS — BP 105/68 | HR 75 | Temp 97.3°F | Ht 71.0 in | Wt 169.8 lb

## 2023-02-22 DIAGNOSIS — R600 Localized edema: Secondary | ICD-10-CM

## 2023-02-22 DIAGNOSIS — K2100 Gastro-esophageal reflux disease with esophagitis, without bleeding: Secondary | ICD-10-CM | POA: Diagnosis not present

## 2023-02-22 DIAGNOSIS — E1169 Type 2 diabetes mellitus with other specified complication: Secondary | ICD-10-CM | POA: Diagnosis not present

## 2023-02-22 DIAGNOSIS — E559 Vitamin D deficiency, unspecified: Secondary | ICD-10-CM | POA: Diagnosis not present

## 2023-02-22 DIAGNOSIS — E1151 Type 2 diabetes mellitus with diabetic peripheral angiopathy without gangrene: Secondary | ICD-10-CM

## 2023-02-22 DIAGNOSIS — Z794 Long term (current) use of insulin: Secondary | ICD-10-CM | POA: Diagnosis not present

## 2023-02-22 DIAGNOSIS — E538 Deficiency of other specified B group vitamins: Secondary | ICD-10-CM | POA: Diagnosis not present

## 2023-02-22 DIAGNOSIS — E890 Postprocedural hypothyroidism: Secondary | ICD-10-CM | POA: Diagnosis not present

## 2023-02-22 DIAGNOSIS — E785 Hyperlipidemia, unspecified: Secondary | ICD-10-CM | POA: Diagnosis not present

## 2023-02-22 DIAGNOSIS — D509 Iron deficiency anemia, unspecified: Secondary | ICD-10-CM | POA: Diagnosis not present

## 2023-02-22 LAB — CBC
Hemoglobin: 11.6 g/dL — ABNORMAL LOW (ref 11.7–15.5)
MCV: 96.7 fL (ref 80.0–100.0)
Platelets: 181 10*3/uL (ref 140–400)

## 2023-02-22 NOTE — Assessment & Plan Note (Signed)
On Lipitor 40 mg daily.  Tolerating well.  Check lipids today.

## 2023-02-22 NOTE — Assessment & Plan Note (Signed)
Check A1c. She will check to see if there is a PA or appeal process we can complete for for invokana.  We will continue her other medications including trulicity 4.5mg  weekly, metformin 2000mg  daily, and repaglinide 2mg  tid.

## 2023-02-22 NOTE — Addendum Note (Signed)
Addended by: Ardith Dark on: 02/22/2023 03:24 PM   Modules accepted: Orders

## 2023-02-22 NOTE — Assessment & Plan Note (Signed)
Check CBC and iron panel. 

## 2023-02-22 NOTE — Assessment & Plan Note (Signed)
Check TSH.  Continue Synthroid 112 mcg daily. 

## 2023-02-22 NOTE — Assessment & Plan Note (Signed)
Chronic problem. Not controlled. Vascular surgery was concerned about lymph edema. Will place referral to lymphedema management clinic. Discussed conservative management including compression stockings, leg elevation, and salt avoidance.

## 2023-02-22 NOTE — Progress Notes (Signed)
   Savannah [name] is a 67 y.o. female who presents today for an office visit.  Assessment/Plan:  Chronic Problems Addressed Today: Type 2 diabetes mellitus with diabetic peripheral angiopathy without gangrene, with long-term current use of insulin (HCC) Check A1c. She will check to see if there is a PA or appeal process we can complete for for invokana.  We will continue her other medications including trulicity 4.5mg  weekly, metformin 2000mg  daily, and repaglinide 2mg  tid.   Leg edema Chronic problem. Not controlled. Vascular surgery was concerned about lymph edema. Will place referral to lymphedema management clinic. Discussed conservative management including compression stockings, leg elevation, and salt avoidance.   Iron deficiency anemia Check CBC and iron panel.  Postsurgical hypothyroidism Check TSH.  Continue Synthroid 112 mcg daily.  Dyslipidemia associated with type 2 diabetes mellitus (HCC) On Lipitor 40 mg daily.  Tolerating well.  Check lipids today.  GERD with esophagitis On Protonix 40 mg twice daily though is having some more dysphagia and sore throat symptoms.  Will have her follow-up with GI soon.  May need repeat EGD.     Subjective:  HPI:  See A/p for status of chronic conditions.   She recently got a letter from her insurance company that they will not pay for her invokana. She has started cutting this in half. She is also on repaglinide 2mg  tid, metformin 2000mg  daily, and trulicity 4.5mg  weekly.    She is still having a lot of issues with swelling in both of her legs. This has been going on for awhile. She did see a different provider here a few months ago. Ended up getting ultrasound evaluation which did show some concern for calcification in her arteries. She was referred to vascular surgery who was not concerned about arterial flow. They thought her symptoms may be consistent with lymph edema. She has tried compression stockings but did not feel like they were  effective.  She has been having some more issues with swallowing recently. She does have a prior history of GERD with esophagitis and has had an EGD with dilation in the past. She has had more difficulty with swallowing pill.  She is also had some more throat irritation with swallowing.       Objective:  Physical Exam: BP 105/68   Pulse 75   Temp (!) 97.3 F (36.3 C) (Temporal)   Ht 5\' 11"  (1.803 m)   Wt 169 lb 12.8 oz (77 kg)   SpO2 100%   BMI 23.68 kg/m   Gen: No acute distress, resting comfortably CV: Regular rate and rhythm with no murmurs appreciated Pulm: Normal work of breathing, clear to auscultation bilaterally with no crackles, wheezes, or rhonchi MSK: 2+ edema to knee bilaterally Neuro: Grossly normal, moves all extremities Psych: Normal affect and thought content      Kadeja Granada M. [name], MD 02/22/2023 3:05 PM

## 2023-02-22 NOTE — Assessment & Plan Note (Signed)
On Protonix 40 mg twice daily though is having some more dysphagia and sore throat symptoms.  Will have her follow-up with GI soon.  May need repeat EGD.

## 2023-02-22 NOTE — Patient Instructions (Signed)
It was very nice to see you today!  We will check blood work today.  I will refer you to see GI for an endoscopy.  Please let us know if there is any appeals process we can do for your Invokana.  I will refer you to the lymphedema clinic for your legs.  Please continue work on diet and exercise.  Will see you back in 3 to 6 months depending on results of your blood work.  Take care, Dr [name]  PLEASE NOTE:  If you had any lab tests, please let us know if you have not heard back within a few days. You may see your results on mychart before we have a chance to review them but we will give you a call once they are reviewed by [place].   If we ordered any referrals today, please let us know if you have not heard from their office within the next week.   If you had any urgent prescriptions sent in today, please check with the pharmacy within an hour of our visit to make sure the prescription was transmitted appropriately.   Please try these tips to maintain a healthy lifestyle:  Eat at least 3 REAL meals and 1-2 snacks per day.  Aim for no more than 5 hours between eating.  If you eat breakfast, please do so within one hour of getting up.   Each meal should contain half fruits/vegetables, one quarter protein, and one quarter carbs (no bigger than a computer mouse)  Cut down on sweet beverages. This includes juice, soda, and sweet tea.   Drink at least 1 glass of water with each meal and aim for at least 8 glasses per day  Exercise at least 150 minutes every week.

## 2023-02-22 NOTE — Addendum Note (Signed)
Addended by: Lorn Junes on: 02/22/2023 03:31 PM   Modules accepted: Orders

## 2023-02-23 LAB — URINALYSIS, ROUTINE W REFLEX MICROSCOPIC
Bilirubin Urine: NEGATIVE
Hgb urine dipstick: NEGATIVE
Ketones, ur: NEGATIVE
Leukocytes,Ua: NEGATIVE
Nitrite: NEGATIVE
Protein, ur: NEGATIVE
Specific Gravity, Urine: 1.024 (ref 1.001–1.035)
pH: <=5 (ref 5.0–8.0)

## 2023-02-23 LAB — VITAMIN D 25 HYDROXY (VIT D DEFICIENCY, FRACTURES): Vit D, 25-Hydroxy: 59 ng/mL (ref 30–100)

## 2023-02-23 LAB — COMPREHENSIVE METABOLIC PANEL
AG Ratio: 1.8 (calc) (ref 1.0–2.5)
ALT: 42 U/L — ABNORMAL HIGH (ref 6–29)
AST: 35 U/L (ref 10–35)
Albumin: 3.7 g/dL (ref 3.6–5.1)
Alkaline phosphatase (APISO): 93 U/L (ref 37–153)
BUN/Creatinine Ratio: 13 (calc) (ref 6–22)
BUN: 19 mg/dL (ref 7–25)
CO2: 24 mmol/L (ref 20–32)
Calcium: 8.7 mg/dL (ref 8.6–10.4)
Chloride: 109 mmol/L (ref 98–110)
Creat: 1.51 mg/dL — ABNORMAL HIGH (ref 0.50–1.05)
Globulin: 2.1 g/dL (calc) (ref 1.9–3.7)
Glucose, Bld: 188 mg/dL — ABNORMAL HIGH (ref 65–99)
Potassium: 4.9 mmol/L (ref 3.5–5.3)
Sodium: 139 mmol/L (ref 135–146)
Total Bilirubin: 0.4 mg/dL (ref 0.2–1.2)
Total Protein: 5.8 g/dL — ABNORMAL LOW (ref 6.1–8.1)

## 2023-02-23 LAB — CBC
HCT: 35.3 % (ref 35.0–45.0)
MCH: 31.8 pg (ref 27.0–33.0)
MCHC: 32.9 g/dL (ref 32.0–36.0)
MPV: 11.6 fL (ref 7.5–12.5)
RBC: 3.65 10*6/uL — ABNORMAL LOW (ref 3.80–5.10)
RDW: 13.1 % (ref 11.0–15.0)
WBC: 3.5 10*3/uL — ABNORMAL LOW (ref 3.8–10.8)

## 2023-02-23 LAB — IRON,TIBC AND FERRITIN PANEL
%SAT: 24 % (calc) (ref 16–45)
Ferritin: 53 ng/mL (ref 16–288)
Iron: 74 ug/dL (ref 45–160)
TIBC: 304 mcg/dL (calc) (ref 250–450)

## 2023-02-23 LAB — LIPID PANEL
Cholesterol: 114 mg/dL (ref <200)
HDL: 41 mg/dL — ABNORMAL LOW (ref >=50)
LDL Cholesterol (Calc): 53 mg/dL (calc)
Non-HDL Cholesterol (Calc): 73 mg/dL (calc) (ref <130)
Total CHOL/HDL Ratio: 2.8 (calc) (ref <5.0)
Triglycerides: 114 mg/dL (ref <150)

## 2023-02-23 LAB — MICROALBUMIN / CREATININE URINE RATIO
Creatinine, Urine: 50 mg/dL (ref 20–275)
Microalb Creat Ratio: 14 mg/g creat (ref <30)
Microalb, Ur: 0.7 mg/dL

## 2023-02-23 LAB — HEMOGLOBIN A1C
Hgb A1c MFr Bld: 7.1 % of total Hgb — ABNORMAL HIGH (ref <5.7)
Mean Plasma Glucose: 157 mg/dL
eAG (mmol/L): 8.7 mmol/L

## 2023-02-23 LAB — VITAMIN B12: Vitamin B-12: >2000 pg/mL — ABNORMAL HIGH (ref 200–1100)

## 2023-02-23 LAB — FOLATE: Folate: >24 ng/mL

## 2023-02-23 LAB — TSH: TSH: 0.1 mIU/L — ABNORMAL LOW (ref 0.40–4.50)

## 2023-02-26 ENCOUNTER — Other Ambulatory Visit: Payer: Self-pay

## 2023-02-26 DIAGNOSIS — R7989 Other specified abnormal findings of blood chemistry: Secondary | ICD-10-CM

## 2023-02-26 DIAGNOSIS — E039 Hypothyroidism, unspecified: Secondary | ICD-10-CM

## 2023-02-26 DIAGNOSIS — D72819 Decreased white blood cell count, unspecified: Secondary | ICD-10-CM

## 2023-02-26 NOTE — Progress Notes (Signed)
Please inform patient of the following:  Her blood counts dropped a little bit since last time.  This is probably nothing to be concerned about however I would like for her to come back in a few weeks to recheck.  Please place future order for CBC.  Her kidney numbers are also up a little bit since last time.  Please make sure that she is getting plenty of fluids.  I would like to recheck a c-Met in a few weeks as well.  Please place future order.  If her kidney function is still elevated on recheck we will need to have her see a nephrologist.  Her blood sugar is better than last time.  A1c is 7.1.  She can continue her current medications and we can recheck in 3 to 6 months.  Her TSH is too low.  She is probably getting too much thyroid.  Recommend we decrease Synthroid to 100 mcg daily.  Please send in a new prescription.  She should come back in 6 weeks to recheck.  The rest of her labs are all stable and we can recheck in year.

## 2023-02-27 ENCOUNTER — Ambulatory Visit: Payer: Medicare Other | Attending: Family Medicine | Admitting: Physical Therapy

## 2023-02-27 ENCOUNTER — Encounter: Payer: Self-pay | Admitting: Physical Therapy

## 2023-02-27 ENCOUNTER — Other Ambulatory Visit: Payer: Self-pay

## 2023-02-27 DIAGNOSIS — M25662 Stiffness of left knee, not elsewhere classified: Secondary | ICD-10-CM | POA: Diagnosis not present

## 2023-02-27 DIAGNOSIS — R600 Localized edema: Secondary | ICD-10-CM | POA: Diagnosis not present

## 2023-02-27 DIAGNOSIS — I890 Lymphedema, not elsewhere classified: Secondary | ICD-10-CM

## 2023-02-27 DIAGNOSIS — M6281 Muscle weakness (generalized): Secondary | ICD-10-CM | POA: Diagnosis not present

## 2023-02-27 DIAGNOSIS — M25562 Pain in left knee: Secondary | ICD-10-CM | POA: Diagnosis not present

## 2023-02-27 DIAGNOSIS — R262 Difficulty in walking, not elsewhere classified: Secondary | ICD-10-CM | POA: Diagnosis not present

## 2023-02-27 NOTE — Therapy (Signed)
OUTPATIENT PHYSICAL THERAPY LOWER EXTREMITY LYMPHEDEMA EVALUATION  Patient Name: RAYMONA BOSS MRN: [id number] DOB:08-27-1956, 67 y.o., female Today's Date: 02/27/2023  END OF SESSION:  PT End of Session - 02/27/23 1001     Visit Number 1    Number of Visits 25    Date for PT Re-Evaluation 04/24/23    PT Start Time 0906    PT Stop Time 0958    PT Time Calculation (min) 52 min    Activity Tolerance Patient tolerated treatment well    Behavior During Therapy [facility] for tasks assessed/performed             Past Medical History:  Diagnosis Date   ABDOMINAL PAIN, CHRONIC 10/20/2009   Acute cystitis 08/17/2009   ALLERGIC RHINITIS 11/21/2007   Allergy    ASYMPTOMATIC POSTMENOPAUSAL STATUS 09/29/2008   Bariatric surgery status 07/07/2010   Bariatric surgery status 07/07/2010   Qualifier: Diagnosis of  By: Everardo All MD, Sean A    Cancer    uterine   Cataract    Coronary artery disease    DEPRESSION 11/21/2007   DIABETES MELLITUS, TYPE II 07/08/2007   DYSPNEA 05/20/2009   Edema 05/20/2009   Eosinophilic esophagitis 02/13/2008   FEVER UNSPECIFIED 10/20/2009   FEVER, HX OF 03/09/2010   GERD (gastroesophageal reflux disease)    Headache(784.0) 02/06/2010   Hepatomegaly 01/06/2008   HYPERLIPIDEMIA 07/08/2007   HYPERTENSION 07/08/2007   LEUKOPENIA, MILD 09/29/2008   OBESITY 01/06/2008   OSTEOARTHRITIS 01/06/2008   Other chronic nonalcoholic liver disease 01/06/2008   PERIPHERAL NEUROPATHY 11/21/2007   Peripheral neuropathy    Postsurgical hypothyroidism 09/19/2010   SINUSITIS- ACUTE-NOS 11/21/2007   TB SKIN TEST, POSITIVE 02/06/2010   THYROID NODULE 03/09/2010   Past Surgical History:  Procedure Laterality Date   ABDOMINAL HYSTERECTOMY     BASAL CELL CARCINOMA EXCISION     CARDIAC CATHETERIZATION     CHOLECYSTECTOMY N/A 05/11/2021   Procedure: LAPAROSCOPIC CHOLECYSTECTOMY WITH POSSIBLE  INTRAOPERATIVE CHOLANGIOGRAM;  Surgeon: Luretha Murphy, MD;  Location: [facility];   Service: General;  Laterality: N/A;   COLONOSCOPY     CORONARY ATHERECTOMY N/A 05/03/2022   Procedure: CORONARY ATHERECTOMY;  Surgeon: Orbie Pyo, MD;  Location: [facility];  Service: Cardiovascular;  Laterality: N/A;   CORONARY IMAGING/OCT N/A 05/03/2022   Procedure: INTRAVASCULAR IMAGING/OCT;  Surgeon: Orbie Pyo, MD;  Location: [facility];  Service: Cardiovascular;  Laterality: N/A;   CORONARY PRESSURE/FFR STUDY N/A 05/03/2022   Procedure: INTRAVASCULAR PRESSURE WIRE/FFR STUDY;  Surgeon: Orbie Pyo, MD;  Location: [facility];  Service: Cardiovascular;  Laterality: N/A;   CORONARY STENT INTERVENTION N/A 05/03/2022   Procedure: CORONARY STENT INTERVENTION;  Surgeon: Orbie Pyo, MD;  Location: [facility];  Service: Cardiovascular;  Laterality: N/A;  lad   EYE SURGERY Bilateral 08/2018, 09/2018   FOOT SURGERY Left    10/2019   KNEE ARTHROSCOPY Left    LEFT HEART CATH AND CORONARY ANGIOGRAPHY N/A 01/20/2019   Procedure: LEFT HEART CATH AND CORONARY ANGIOGRAPHY;  Surgeon: Lyn Records, MD;  Location: [facility];  Service: Cardiovascular;  Laterality: N/A;   LYMPH NODE DISSECTION     OOPHORECTOMY     Right total knee replacement     RIGHT/LEFT HEART CATH AND CORONARY ANGIOGRAPHY N/A 05/03/2022   Procedure: RIGHT/LEFT HEART CATH AND CORONARY ANGIOGRAPHY;  Surgeon: Orbie Pyo, MD;  Location: [facility];  Service: Cardiovascular;  Laterality: N/A;  ROUX-EN-Y GASTRIC BYPASS  2011   SKIN TAG REMOVAL     THYROIDECTOMY     TONSILLECTOMY AND ADENOIDECTOMY     TOOTH EXTRACTION  2023   UPPER GASTROINTESTINAL ENDOSCOPY     Patient Active Problem List   Diagnosis Date Noted   Bilateral carpal tunnel syndrome 03/13/2022   Leg edema 03/01/2022   Nondisplaced comminuted fracture of left patella, initial encounter for closed fracture 08/01/2021   Arthritis of carpometacarpal (CMC) joint of right thumb 07/28/2021   Primary  osteoarthritis of right distal radioulnar joint 07/28/2021   Arthritis of wrist, right, degenerative 07/28/2021   Osteopenia 01/14/2021   Sesamoiditis of left foot 11/09/2020   Posterior tibial tendinitis of left lower extremity 11/09/2020   Type 2 diabetes mellitus with diabetic peripheral angiopathy without gangrene, with long-term current use of insulin 10/21/2020   Iron deficiency anemia 03/14/2020   Lesion of vulva 09/02/2019   Menorrhagia 09/02/2019   Neuropathy 09/02/2019   Irregular bowel habits 06/25/2019   Coronary artery disease involving native coronary artery of native heart without angina pectoris    Temporomandibular joint disorder 12/05/2018   Fatigue 12/05/2018   Inverted nipple 12/05/2018   Senile purpura 11/21/2018   GERD with esophagitis 11/21/2018   Pseudogout involving multiple joints 11/21/2018   Lipoma 07/22/2017   Lichen sclerosus 11/16/2016   H/O gastric bypass 11/16/2016   Depression 11/16/2016   Diabetic polyneuropathy associated with type 2 diabetes mellitus 11/16/2016   Uterine cancer (HCC) s/p hysterectomy 1997 01/04/2012   Postsurgical hypothyroidism 09/19/2010   Diabetic neuropathy 11/21/2007   Allergic rhinitis 11/21/2007   Dyslipidemia associated with type 2 diabetes mellitus (HCC) 07/08/2007    PCP: [name], MD  REFERRING PROVIDER: Ardith Dark, MD  REFERRING DIAG: R60.0 (ICD-10-CM) - Leg edema  THERAPY DIAG:  Lymphedema, not elsewhere classified  Difficulty in walking, not elsewhere classified  Rationale for Evaluation and Treatment: Rehabilitation  ONSET DATE: Prior to 1997  SUBJECTIVE:                                                                                                                                                                                           SUBJECTIVE STATEMENT: I have been having swelling since before 1997 that was mostly in my ankles. Then after my surgery for uterine cancer it worsened on the  L leg. I take lasix but it has minimal effect. I have never had lymphedema therapy. I had prescription strength thigh highs but they did not help.   PERTINENT HISTORY:  Uterine cancer in 1997 with lymph node removal (pt reports the surgeon told her he got all the  lymph nodes he could find bilaterally), pt reports her vascular doctor told her the left leg edema was vascular, diabetic, has hx of cardiac stent   PAIN:  Are you having pain? Yes: NPRS scale: 1/10 Pain location: goes up to an 8/10 when standing, lower back Pain description: sharp Aggravating factors: standing up, moving Relieving factors: sitting but not for too long in same position  PRECAUTIONS: Other: R Knee TKA  WEIGHT BEARING RESTRICTIONS: No  FALLS:  Has patient fallen in last 6 months? Yes. Number of falls 3 or 4 Falls due to feeling unsteady, has neuropathy, BP has been an issue - low  LIVING ENVIRONMENT: Lives with: lives alone Lives in: House/apartment town home Stairs: Yes; External: 8 steps; can reach both can go in back with 1 step Has following equipment at home: Single point cane  OCCUPATION: retired  LEISURE: pt has not exercised recently  HAND DOMINANCE: right   PRIOR LEVEL OF FUNCTION: Independent  PATIENT GOALS: to get the swelling down   OBJECTIVE:  COGNITION:  Overall cognitive status: Within functional limits for tasks assessed   PALPATION: Pitting edema throughout LLE, pitting edema at R ankle and foot  OBSERVATIONS / OTHER ASSESSMENTS: LLE about 30% larger than R throughout, R ankle full  SENSATION: pt reports neuropathy in bilateral feet   POSTURE: forward head, rounded shoulders   LYMPHEDEMA ASSESSMENTS:    LE LANDMARK RIGHT 02/27/2023  At groin   30 cm proximal to suprapatella   20 cm proximal to suprapatella 47  10 cm proximal to suprapatella 40.3  At midpatella / popliteal crease 39  30 cm proximal to floor at lateral plantar foot 32.1  20 cm proximal to floor at  lateral plantar foot 26.1  10 cm proximal to floor at lateral plantar foot 25.5  Circumference of ankle/heel   5 cm proximal to 1st MTP joint 22.7  Across MTP joint 24  Around proximal great toe 8  (Blank rows = not tested)  LE LANDMARK LEFT 02/27/2023  At groin   30 cm proximal to suprapatella   20 cm proximal to suprapatella 46  10 cm proximal to suprapatella 41.2  At midpatella / popliteal crease 40.3  30 cm proximal to floor at lateral plantar foot 37.5  20 cm proximal to floor at lateral plantar foot 30.3  10 cm proximal to floor at lateral plantar foot 28.1  Circumference of ankle/heel   5 cm proximal to 1st MTP joint 25  Across MTP joint 23.4  Around proximal great toe 8.7  (Blank rows = not tested)    TODAY'S TREATMENT:                                                                                                                                         DATE:  02/27/23- none today due to time constraints  PATIENT EDUCATION:  Education details: anatomy and physiology  of the lymphatic system, need for day and night time compression garments, compression bandaging vs compression garments, venous insufficiency vs lymphedema Person educated: Patient Education method: Explanation Education comprehension: verbalized understanding  HOME EXERCISE PROGRAM: Get post op shoes and appropriate clothing to begin bandaging at next session  ASSESSMENT:  CLINICAL IMPRESSION: Patient is a 67 y.o. female who was seen today for physical therapy evaluation and treatment for bilateral LE lymphedema with L present from foot to thigh and R mainly at foot and ankle. Pt reports she had bilateral ankle swelling prior to 1997 but then she had surgery for uterine cancer with lymph node removal and her L LE has worsened since. Her lymphedema is pitting bilaterally and her L LE is about 30 percent larger than her R. She has never had lymphedema therapy in the past. She also has neuropathy in  bilateral feet and has had numerous falls. Pt reports she has issues with her blood pressure dropping which can lead to falls. Pt would benefit from skilled PT services to decrease L LE lymphedema and assist pt with obtaining proper day and night time compression garments for long term management of lymphedema.   OBJECTIVE IMPAIRMENTS: decreased knowledge of condition, decreased knowledge of use of DME, difficulty walking, increased edema, and pain.   ACTIVITY LIMITATIONS: standing and locomotion level  PARTICIPATION LIMITATIONS: community activity  PERSONAL FACTORS: Fitness and Time since onset of injury/illness/exacerbation are also affecting patient's functional outcome.   REHAB POTENTIAL: Good  CLINICAL DECISION MAKING: Evolving/moderate complexity  EVALUATION COMPLEXITY: Moderate   GOALS: Goals reviewed with patient? Yes  SHORT TERM GOALS: Target date: 03/27/23  Pt will demonstrate a 3 cm decrease in edema at 30 cm superior to floor at lateral malleoli.  Baseline: Goal status: INITIAL   LONG TERM GOALS: Target date: 04/24/23  Pt will be independent in self MLD for long term management of lymphedema.  Baseline:  Goal status: INITIAL  2.  Pt will receive trial of FlexiTouch compression pump for long term management of lymphedema.  Baseline:  Goal status: INITIAL  3.  Pt will obtain appropriate day and night time garments for long term management of lymphedema.  Baseline:  Goal status: INITIAL  4.  Pt will demonstrate maximal circumferential reduction to decrease risk of cellulitis and improve mobility.  Baseline:  Goal status: INITIAL    PLAN:  PT FREQUENCY: 3x/week  PT DURATION: 4 weeks  PLANNED INTERVENTIONS: Therapeutic exercises, Therapeutic activity, Patient/Family education, Self Care, Orthotic/Fit training, Manual lymph drainage, Compression bandaging, Taping, Vasopneumatic device, Manual therapy, and Re-evaluation  PLAN FOR NEXT SESSION: discuss  FlexiTouch, begin CDT to L LE - bandage foot to thigh   [employer], PT 02/27/2023, 1:09 PM

## 2023-03-01 ENCOUNTER — Encounter: Payer: Self-pay | Admitting: Internal Medicine

## 2023-03-01 DIAGNOSIS — L280 Lichen simplex chronicus: Secondary | ICD-10-CM | POA: Diagnosis not present

## 2023-03-01 DIAGNOSIS — L900 Lichen sclerosus et atrophicus: Secondary | ICD-10-CM | POA: Diagnosis not present

## 2023-03-04 ENCOUNTER — Encounter: Payer: Self-pay | Admitting: Physician Assistant

## 2023-03-04 ENCOUNTER — Other Ambulatory Visit: Payer: Self-pay | Admitting: Family Medicine

## 2023-03-06 ENCOUNTER — Other Ambulatory Visit: Payer: Self-pay

## 2023-03-06 MED ORDER — LEVOTHYROXINE SODIUM 100 MCG PO TABS: 100.0000 ug | ORAL_TABLET | Freq: Every day | ORAL | 3 refills | Status: DC

## 2023-03-07 ENCOUNTER — Ambulatory Visit (INDEPENDENT_AMBULATORY_CARE_PROVIDER_SITE_OTHER): Payer: Medicare Other | Admitting: Physician Assistant

## 2023-03-07 ENCOUNTER — Encounter: Payer: Self-pay | Admitting: Physician Assistant

## 2023-03-07 ENCOUNTER — Telehealth: Payer: Self-pay | Admitting: *Deleted

## 2023-03-07 VITALS — BP 100/60 | HR 88 | Ht 71.0 in | Wt 176.0 lb

## 2023-03-07 DIAGNOSIS — R490 Dysphonia: Secondary | ICD-10-CM | POA: Diagnosis not present

## 2023-03-07 DIAGNOSIS — R1310 Dysphagia, unspecified: Secondary | ICD-10-CM

## 2023-03-07 DIAGNOSIS — Z9884 Bariatric surgery status: Secondary | ICD-10-CM

## 2023-03-07 NOTE — Progress Notes (Signed)
Chief Complaint: Dysphagia, hoarseness and coughing  HPI:    Savannah [name] is a 67 year old female with a past medical history as listed below status post Roux-en-Y gastric bypass, known to Dr. Adela Lank, who was referred to me by Ardith Dark, MD for a complaint of dysphagia, hoarseness and coughing.    08/18/2019 office visit with Dr. Adela Lank for constipation.  At that point thought Citrucel provided some benefit.  Thought she had some element of pelvic floor dysfunction and recommended MiraLAX.  Discussed possible referral to PT for biofeedback.    Today, patient presents to clinic and tells me that many years ago she was diagnosed with eosinophilic esophagitis and placed on what sounds like swallowed Fluticasone which helped resolve her symptoms.  She was then seen by [place] after that point and had an EGD as below with no further signs of EOE.  She also describes history of a "ring" in her throat.  Tells me most recently over the past few months she has been noticing an increase in hoarseness which is there throughout the day and worse if she talks a lot.  Also feels like there is a sensitivity when she presses on the front side of her neck maybe rated as a 1-2/10 as well as pills starting to get stuck and sometimes if she swallows wrong and hits the back of her throat she will start a coughing fit that often ends in vomiting.  She continues on Pantoprazole 40 mg twice daily.  Tells me she was a smoker for years prior to quitting and is worried that something else is going on now.    Denies fever, chills, abdominal pain, weight loss or symptoms that awaken her from sleep.  Previous GI workup: EGD 01/2017: - Normal esophagus, no evidence of EoE - biopsies taken to rule out EoE. - Roux-en-Y gastrojejunostomy with gastrojejunal anastomosis characterized by healthy appearing mucosa. - A few benign appearing gastric polyps. Resected and retrieved. - Larger than expected gastric pouch. - Normal  examined small bowel limb. Biopsies negative for H pylori and normal esophageal biopsies   Colonoscopy 01/2017 - one 5 mm polyp that was removed - adenoma, looping of the colon, and internal hemorrhoids.-Repeat due March 2025     CT scan 07/09/19 - IMPRESSION: 1. Mild wall thickening and mucosal enhancement involving the antrum of the excluded stomach, suspicious for gastritis. 2. No evidence of recurrent or metastatic carcinoma within the abdomen or pelvis. 3. Large stool burden throughout colon; recommend clinical correlation for possible constipation. 4. Cholelithiasis or gallbladder sludge. No radiographic evidence of cholecystitis.   RUQ [place] 05/27/19 - normal      Past Medical History:  Diagnosis Date   ABDOMINAL PAIN, CHRONIC 10/20/2009   Acute cystitis 08/17/2009   ALLERGIC RHINITIS 11/21/2007   Allergy    ASYMPTOMATIC POSTMENOPAUSAL STATUS 09/29/2008   Bariatric surgery status 07/07/2010   Bariatric surgery status 07/07/2010   Qualifier: Diagnosis of  By: Everardo All MD, Sean A    Cancer    uterine   Cataract    Coronary artery disease    DEPRESSION 11/21/2007   DIABETES MELLITUS, TYPE II 07/08/2007   DYSPNEA 05/20/2009   Edema 05/20/2009   Eosinophilic esophagitis 02/13/2008   FEVER UNSPECIFIED 10/20/2009   FEVER, HX OF 03/09/2010   GERD (gastroesophageal reflux disease)    Headache(784.0) 02/06/2010   Hepatomegaly 01/06/2008   HYPERLIPIDEMIA 07/08/2007   HYPERTENSION 07/08/2007   LEUKOPENIA, MILD 09/29/2008   OBESITY 01/06/2008   OSTEOARTHRITIS 01/06/2008  Other chronic nonalcoholic liver disease 01/06/2008   PERIPHERAL NEUROPATHY 11/21/2007   Peripheral neuropathy    Postsurgical hypothyroidism 09/19/2010   SINUSITIS- ACUTE-NOS 11/21/2007   TB SKIN TEST, POSITIVE 02/06/2010   THYROID NODULE 03/09/2010    Past Surgical History:  Procedure Laterality Date   ABDOMINAL HYSTERECTOMY     BASAL CELL CARCINOMA EXCISION     CARDIAC CATHETERIZATION      CHOLECYSTECTOMY N/A 05/11/2021   Procedure: LAPAROSCOPIC CHOLECYSTECTOMY WITH POSSIBLE  INTRAOPERATIVE CHOLANGIOGRAM;  Surgeon: Luretha Murphy, MD;  Location: [facility];  Service: General;  Laterality: N/A;   COLONOSCOPY     CORONARY ATHERECTOMY N/A 05/03/2022   Procedure: CORONARY ATHERECTOMY;  Surgeon: Orbie Pyo, MD;  Location: [facility];  Service: Cardiovascular;  Laterality: N/A;   CORONARY IMAGING/OCT N/A 05/03/2022   Procedure: INTRAVASCULAR IMAGING/OCT;  Surgeon: Orbie Pyo, MD;  Location: [facility];  Service: Cardiovascular;  Laterality: N/A;   CORONARY PRESSURE/FFR STUDY N/A 05/03/2022   Procedure: INTRAVASCULAR PRESSURE WIRE/FFR STUDY;  Surgeon: Orbie Pyo, MD;  Location: [facility];  Service: Cardiovascular;  Laterality: N/A;   CORONARY STENT INTERVENTION N/A 05/03/2022   Procedure: CORONARY STENT INTERVENTION;  Surgeon: Orbie Pyo, MD;  Location: [facility];  Service: Cardiovascular;  Laterality: N/A;  lad   EYE SURGERY Bilateral 08/2018, 09/2018   FOOT SURGERY Left    10/2019   KNEE ARTHROSCOPY Left    LEFT HEART CATH AND CORONARY ANGIOGRAPHY N/A 01/20/2019   Procedure: LEFT HEART CATH AND CORONARY ANGIOGRAPHY;  Surgeon: Lyn Records, MD;  Location: [facility];  Service: Cardiovascular;  Laterality: N/A;   LYMPH NODE DISSECTION     OOPHORECTOMY     Right total knee replacement     RIGHT/LEFT HEART CATH AND CORONARY ANGIOGRAPHY N/A 05/03/2022   Procedure: RIGHT/LEFT HEART CATH AND CORONARY ANGIOGRAPHY;  Surgeon: Orbie Pyo, MD;  Location: [facility];  Service: Cardiovascular;  Laterality: N/A;   ROUX-EN-Y GASTRIC BYPASS  2011   SKIN TAG REMOVAL     THYROIDECTOMY     TONSILLECTOMY AND ADENOIDECTOMY     TOOTH EXTRACTION  2023   UPPER GASTROINTESTINAL ENDOSCOPY      Current Outpatient Medications  Medication Sig Dispense Refill   acetaminophen (TYLENOL) 500 MG tablet Take 2 tablets (1,000 mg  total) by mouth every 6 (six) hours as needed for mild pain or fever. 30 tablet 0   AMBULATORY NON FORMULARY MEDICATION Museum/gallery exhibitions officer at [employer].  Correct leg length and improve pronation BL 1 each 0   aspirin EC 81 MG tablet TAKE 1 TABLET (81 MG TOTAL) BY MOUTH DAILY. SWALLOW WHOLE. 30 tablet 3   atorvastatin (LIPITOR) 40 MG tablet Take 1 tablet (40 mg total) by mouth daily. 90 tablet 3   benzonatate (TESSALON) 200 MG capsule Take 1 capsule (200 mg total) by mouth 2 (two) times daily as needed for cough. 20 capsule 0   Cholecalciferol (VITAMIN D) 50 MCG (2000 UT) tablet Take 4,000 Units by mouth in the morning.     clopidogrel (PLAVIX) 75 MG tablet TAKE 1 TABLET BY MOUTH EVERY DAY 90 tablet 3   cyclobenzaprine (FLEXERIL) 10 MG tablet Take 10 mg by mouth at bedtime as needed for muscle spasms.     diclofenac sodium (VOLTAREN) 1 % GEL Apply 4 g topically 4 (four) times daily. 100 g 11   Dulaglutide (TRULICITY) 4.5 MG/0.5ML SOPN Inject 4.5 mg as directed  once a week. 6 mL 3   furosemide (LASIX) 20 MG tablet Take 1 tablet (20 mg total) by mouth as needed. 30 tablet 2   gabapentin (NEURONTIN) 600 MG tablet TAKE 2 TABLETS BY MOUTH 3 TIMES DAILY. 180 tablet 1   Halobetasol & Lactic Acid (ULTRAVATE X, OINTMENT, EX) Apply 1 Application topically 2 (two) times daily.     INVOKANA 300 MG TABS tablet TAKE 1 TABLET BY MOUTH DAILY BEFORE BREAKFAST. 30 tablet 5   levalbuterol (XOPENEX HFA) 45 MCG/ACT inhaler Inhale 1 puff into the lungs every 4 (four) hours as needed for wheezing. 1 each 12   levocetirizine (XYZAL) 5 MG tablet Take 5 mg by mouth every evening.     levothyroxine (SYNTHROID) 100 MCG tablet Take 1 tablet (100 mcg total) by mouth daily. 90 tablet 3   lidocaine (LIDODERM) 5 % PLACE 1 PATCH ONTO THE SKIN DAILY. REMOVE & DISCARD PATCH WITHIN 12 HOURS OR AS DIRECTED BY MD 30 patch 0   MAGNESIUM PO Take 3 tablets by mouth daily as needed (foot cramps).     metFORMIN (GLUCOPHAGE-XR) 500 MG  24 hr tablet TAKE 4 TABLETS (2,000 MG TOTAL) BY MOUTH DAILY. 360 tablet 3   Multiple Vitamin (MULTIVITAMIN) tablet Take 1 tablet by mouth in the morning and at bedtime.     nitroGLYCERIN (NITROSTAT) 0.4 MG SL tablet Place 1 tablet (0.4 mg total) under the tongue every 5 (five) minutes as needed. 25 tablet 3   ONETOUCH ULTRA test strip USE TO MONITOR GLUCOSE LEVELS ONCE PER DAY E11.40 100 strip 2   pantoprazole (PROTONIX) 40 MG tablet TAKE 1 TABLET BY MOUTH TWICE A DAY 180 tablet 1   repaglinide (PRANDIN) 2 MG tablet Take 1 tablet (2 mg total) by mouth 3 (three) times daily before meals. 270 tablet 3   traMADol (ULTRAM) 50 MG tablet Take 50 mg by mouth every 6 (six) hours as needed (pain).     No current facility-administered medications for this visit.    Allergies as of 03/07/2023 - Review Complete 03/07/2023  Allergen Reaction Noted   Ace inhibitors Cough    Caffeine  01/14/2019   Ciprofloxacin Itching 11/16/2016   Morphine and related Itching 05/29/2019   Mucinex [guaifenesin er]  01/14/2019   Nsaids  02/03/2016   Other  12/05/2018   Silicone Hives 05/21/2020   Tape Hives 05/10/2021    Family History  Problem Relation Age of Onset   Diabetes Mother    Neuropathy Mother    Diabetes Father    Congestive Heart Failure Father    Hypertension Father    Myelodysplastic syndrome Father    Bladder Cancer Sister    Multiple sclerosis Sister    Breast cancer Sister        breast onset age 2   Osteoporosis Sister    Liver cancer Maternal Aunt    Breast cancer Maternal Aunt    Diabetes Maternal Aunt    Colon cancer Maternal Uncle    Diabetes Maternal Uncle    Neuropathy Paternal Aunt    Diabetes Maternal Grandmother    Diabetes Paternal Grandmother     Social History   Socioeconomic History   Marital status: Single    Spouse name: Not on file   Number of children: 0   Years of education: 16   Highest education level: Bachelor's degree (e.g., BA, AB, BS)  Occupational  History   Occupation: Research scientist (medical)   Occupation: Retired  Tobacco Use   Smoking status:  Former    Packs/day: 1.50    Years: 15.00    Additional pack years: 0.00    Total pack years: 22.50    Types: Cigarettes    Quit date: 1992    Years since quitting: 32.3    Passive exposure: Never   Smokeless tobacco: Never  Vaping Use   Vaping Use: Never used  Substance and Sexual Activity   Alcohol use: Yes    Comment: rarely   Drug use: Never   Sexual activity: Not Currently  Other Topics Concern   Not on file  Social History Narrative   Not on file   Social Determinants of Health   Financial Resource Strain: Low Risk  (02/20/2023)   Overall Financial Resource Strain (CARDIA)    Difficulty of Paying Living Expenses: Not hard at all  Food Insecurity: No Food Insecurity (02/20/2023)   Hunger Vital Sign    Worried About Running Out of Food in the Last Year: Never true    Ran Out of Food in the Last Year: Never true  Transportation Needs: No Transportation Needs (02/20/2023)   PRAPARE - Administrator, Civil Service (Medical): No    Lack of Transportation (Non-Medical): No  Physical Activity: Unknown (02/20/2023)   Exercise Vital Sign    Days of Exercise per Week: 0 days    Minutes of Exercise per Session: Not on file  Stress: No Stress Concern Present (02/20/2023)   [employer] of Occupational Health - Occupational Stress Questionnaire    Feeling of Stress : Not at all  Social Connections: Moderately Integrated (02/20/2023)   Social Connection and Isolation Panel [NHANES]    Frequency of Communication with Friends and Family: More than three times a week    Frequency of Social Gatherings with Friends and Family: Twice a week    Attends Religious Services: More than 4 times per year    Active Member of [employer] or Organizations: Yes    Attends Engineer, structural: More than 4 times per year    Marital Status: Never married  Intimate Partner Violence: Not At Risk  (05/19/2020)   Humiliation, Afraid, Rape, and Kick questionnaire    Fear of Current or Ex-Partner: No    Emotionally Abused: No    Physically Abused: No    Sexually Abused: No    Review of Systems:    Constitutional: No weight loss, fever or chills Skin: No rash  Cardiovascular: No chest pain Respiratory: No SOB  Gastrointestinal: See HPI and otherwise negative Genitourinary: No dysuria  Neurological: No headache, dizziness or syncope Musculoskeletal: No new muscle or joint pain Hematologic: No bleeding Psychiatric: No history of depression or anxiety   Physical Exam:  Vital signs: BP 100/60   Pulse 88   Ht 5\' 11"  (1.803 m)   Wt 176 lb (79.8 kg)   BMI 24.55 kg/m   Constitutional:   Pleasant Caucasian female appears to be in NAD, Well developed, Well nourished, alert and cooperative Head:  Normocephalic and atraumatic. Eyes:   PEERL, EOMI. No icterus. Conjunctiva pink. Ears:  Normal auditory acuity. Neck:  Supple Throat: Oral cavity and pharynx without inflammation, swelling or lesion.  Respiratory: Respirations even and unlabored. Lungs clear to auscultation bilaterally.   No wheezes, crackles, or rhonchi.  Cardiovascular: Normal S1, S2. No MRG. Regular rate and rhythm. No peripheral edema, cyanosis or pallor.  Gastrointestinal:  Soft, nondistended, mild epigastric ttp, No rebound or guarding. Normal bowel sounds. No appreciable masses or hepatomegaly. Rectal:  Not performed.  Msk:  Symmetrical without gross deformities. Without edema, no deformity or joint abnormality.  Neurologic:  Alert and  oriented x4;  grossly normal neurologically.  Skin:   Dry and intact without significant lesions or rashes. Psychiatric:  Demonstrates good judgement and reason without abnormal affect or behaviors.  RELEVANT LABS AND IMAGING: CBC    Component Value Date/Time   WBC 3.5 (L) 02/22/2023 1513   RBC 3.65 (L) 02/22/2023 1513   HGB 11.6 (L) 02/22/2023 1513   HGB 13.6 05/01/2022 0943    HCT 35.3 02/22/2023 1513   HCT 40.5 05/01/2022 0943   PLT 181 02/22/2023 1513   PLT 187 05/01/2022 0943   MCV 96.7 02/22/2023 1513   MCV 96 05/01/2022 0943   MCH 31.8 02/22/2023 1513   MCHC 32.9 02/22/2023 1513   RDW 13.1 02/22/2023 1513   RDW 13.0 05/01/2022 0943   LYMPHSABS 1.2 05/01/2022 0943   MONOABS 0.2 05/09/2021 1933   EOSABS 0.3 05/01/2022 0943   BASOSABS 0.0 05/01/2022 0943    CMP     Component Value Date/Time   NA 139 02/22/2023 1513   NA 141 05/01/2022 0943   K 4.9 02/22/2023 1513   CL 109 02/22/2023 1513   CO2 24 02/22/2023 1513   GLUCOSE 188 (H) 02/22/2023 1513   BUN 19 02/22/2023 1513   BUN 22 05/01/2022 0943   CREATININE 1.51 (H) 02/22/2023 1513   CALCIUM 8.7 02/22/2023 1513   CALCIUM 8.9 01/30/2013 1017   PROT 5.8 (L) 02/22/2023 1513   PROT 5.8 (L) 10/09/2021 1214   ALBUMIN 3.9 11/20/2022 1515   ALBUMIN 4.0 10/09/2021 1214   AST 35 02/22/2023 1513   AST 25 02/29/2020 1303   ALT 42 (H) 02/22/2023 1513   ALT 37 02/29/2020 1303   ALKPHOS 110 11/20/2022 1515   BILITOT 0.4 02/22/2023 1513   BILITOT 0.4 10/09/2021 1214   BILITOT 0.4 02/29/2020 1303   GFRNONAA >60 05/13/2021 0645   GFRNONAA 63 08/16/2020 1207   GFRAA 73 08/16/2020 1207    Assessment: 1.  Hoarseness/dysphagia/coughing: Describes distant history of EOE, at time of last EGD in 2018 biopsies were negative for this, also describes rings in her throat, not seen at time of last biopsy most recently with increasing hoarseness and dysphagia to pills as well as a sensitivity in her throat and coughing fits, previously a smoker for years and is worried; consider GERD plus esophagitis most likely +/- stricture/ring versus web +/- EOE 2.  History of polyps: Repeat colonoscopy recommended in March 2025  Plan: 1.  Scheduled patient for diagnostic EGD in the [facility] with possible dilation with Dr. Adela Lank.  Did provide the patient a detailed list of risks for the procedure and she agrees to proceed.  Patient is appropriate for endoscopic procedure(s) in the ambulatory ([facility]) setting.  2.  Patient does tell me that EGDs are usually very painful for her.  She has required Carafate for a week after time of procedures in the past to help soothe her throat.  She is not excited about this but very worried about what could be going on. 3.  Patient to continue Pantoprazole 40 twice daily.  She has enough of this at home. 4.  Patient to follow in clinic with [place] per recommendations after time of EGD.  Hyacinth Meeker, PA-C [facility] 03/07/2023, 10:40 AM  Cc: Ardith Dark, MD

## 2023-03-07 NOTE — Telephone Encounter (Signed)
Request for surgical clearance:     Endoscopy Procedure  What type of surgery is being performed?     endoscopy  When is this surgery scheduled?     03/18/23  What type of clearance is required ?   Pharmacy  Are there any medications that need to be held prior to surgery and how long? Plavix, 5 days  Practice name and name of physician performing surgery?      [facility]  What is your office phone and fax number?      Phone- [phone]  Fax- [phone]  Anesthesia type (None, local, MAC, general) ?       MAC

## 2023-03-07 NOTE — Patient Instructions (Signed)
You have been scheduled for an endoscopy. Please follow written instructions given to you at your visit today. If you use inhalers (even only as needed), please bring them with you on the day of your procedure.  Continue pantoprazole 40 mg twice daily.  _______________________________________________________  If your blood pressure at your visit was 140/90 or greater, please contact your primary care physician to follow up on this.  _______________________________________________________  If you are age 69 or older, your body mass index should be between 23-30. Your Body mass index is 24.55 kg/m. If this is out of the aforementioned range listed, please consider follow up with your Primary Care Provider.  If you are age 58 or younger, your body mass index should be between 19-25. Your Body mass index is 24.55 kg/m. If this is out of the aformentioned range listed, please consider follow up with your Primary Care Provider.   ________________________________________________________  The [facility] providers would like to encourage you to use [facility] to communicate with providers for non-urgent requests or questions.  Due to long hold times on the telephone, sending your provider a message by [facility] may be a faster and more efficient way to get a response.  Please allow 48 business hours for a response.  Please remember that this is for non-urgent requests.  _______________________________________________________

## 2023-03-07 NOTE — Progress Notes (Signed)
Agree with assessment and plan as outlined.  

## 2023-03-11 ENCOUNTER — Ambulatory Visit: Payer: Medicare Other | Admitting: Rehabilitation

## 2023-03-11 ENCOUNTER — Encounter: Payer: Self-pay | Admitting: Rehabilitation

## 2023-03-11 DIAGNOSIS — I890 Lymphedema, not elsewhere classified: Secondary | ICD-10-CM

## 2023-03-11 DIAGNOSIS — M25562 Pain in left knee: Secondary | ICD-10-CM | POA: Diagnosis not present

## 2023-03-11 DIAGNOSIS — M6281 Muscle weakness (generalized): Secondary | ICD-10-CM | POA: Diagnosis not present

## 2023-03-11 DIAGNOSIS — R262 Difficulty in walking, not elsewhere classified: Secondary | ICD-10-CM

## 2023-03-11 DIAGNOSIS — M25662 Stiffness of left knee, not elsewhere classified: Secondary | ICD-10-CM | POA: Diagnosis not present

## 2023-03-11 DIAGNOSIS — R600 Localized edema: Secondary | ICD-10-CM | POA: Diagnosis not present

## 2023-03-11 NOTE — Patient Instructions (Signed)
PLEASE KEEP YOUR BANDAGES ON AS LONG AS POSSIBLE TO GET THE BEST SWELLING REDUCTION. ?Should your bandages become uncomfortable or feel too tight, follow these steps: ?Elevate your extremity higher than your heart.  ?Try to move your arm or leg joints against the firmness of the bandage to help with moving the fluid and allow the bandages to loosen a bit.  ?If the bandaging is still is too tight, it is ok to carefully remove the top layer.  There will still be more layers under it that can provide compression to your extremity. ?Finally, if you STILL have significant pain after trying these steps, it is ok to take the bandage off.  Check your skin carefully for any signs of irritation  ?PLEASE bring ALL bandage materials back to your next appointment as we will reuse what we can ?TAKE CARE OF YOUR BANDAGES SO THEY WILL LAST LONGER AND STAY IN BETTER CONDITION ?Washing bandages:  Wash periodically using a mild detergent in warm water.  Do not use fabric softener or bleach.  Place bandages in a mesh lingerie bag or in a tied off pillow case and use the gentle cycle of the washing machine or hand wash. If you hand wash, you may want to put them in the spin cycle of your washer to get the extra water out, but make sure you put them in a mesh bag first. Do not wring or stretch them while they are wet.  ?Drying bandages: Lay the bandages out smoothly on a towel away from direct sunlight or heating sources that can damage the fabric. Rolling bandages in a towel and gently squeezing the towel to remove excess water before laying them out can speed up the process.  If you use a drying rack, place a towel on top of the rack to lay the bandages on.  If they hang down to dry, they fabric could be stretched out and the bandage will lose its compression.   Or, keep bandages in the mesh bag and dry them in the dryer on the low or no heat cycle. ?Rolling bandages: Please roll your bandages after drying them so they are ready for  your next treatment. If they are rolled too loose, they will be difficult to apply.  If rolled too tight, they can get stretched out.  ? ?TAKE CARE OF YOUR SKIN ?Apply a low pH moisturizing lotion to your skin daily ?Avoid scratching your skin ?Treat skin irritations quickly  ?Know the 5 warning signs of infection: redness, pain, warmth to touch, fever and increased swelling.  Call your physician immediately if you notice any of these signs of a possible infection. ? ?

## 2023-03-11 NOTE — Telephone Encounter (Signed)
Please advise on Plavix hold for upcoming procedure. We have not received any answer. Thanks.

## 2023-03-11 NOTE — Therapy (Signed)
OUTPATIENT PHYSICAL THERAPY LOWER EXTREMITY LYMPHEDEMA TREATMENT  Patient Name: Savannah [name] MRN: [id number] DOB:25-Sep-1956, 67 y.o., female Today's Date: 03/11/2023  END OF SESSION:  PT End of Session - 03/11/23 1659     Visit Number 2    Number of Visits 25    Date for PT Re-Evaluation 04/24/23    PT Start Time 1500    PT Stop Time 1545    PT Time Calculation (min) 45 min    Activity Tolerance Patient tolerated treatment well    Behavior During Therapy [facility] for tasks assessed/performed             Past Medical History:  Diagnosis Date   ABDOMINAL PAIN, CHRONIC 10/20/2009   Acute cystitis 08/17/2009   ALLERGIC RHINITIS 11/21/2007   Allergy    ASYMPTOMATIC POSTMENOPAUSAL STATUS 09/29/2008   Bariatric surgery status 07/07/2010   Bariatric surgery status 07/07/2010   Qualifier: Diagnosis of  By: Everardo All MD, Sean A    Cancer [facility])    uterine   Cataract    Coronary artery disease    DEPRESSION 11/21/2007   DIABETES MELLITUS, TYPE II 07/08/2007   DYSPNEA 05/20/2009   Edema 05/20/2009   Eosinophilic esophagitis 02/13/2008   FEVER UNSPECIFIED 10/20/2009   FEVER, HX OF 03/09/2010   GERD (gastroesophageal reflux disease)    Headache(784.0) 02/06/2010   Hepatomegaly 01/06/2008   HYPERLIPIDEMIA 07/08/2007   HYPERTENSION 07/08/2007   LEUKOPENIA, MILD 09/29/2008   OBESITY 01/06/2008   OSTEOARTHRITIS 01/06/2008   Other chronic nonalcoholic liver disease 01/06/2008   PERIPHERAL NEUROPATHY 11/21/2007   Peripheral neuropathy    Postsurgical hypothyroidism 09/19/2010   SINUSITIS- ACUTE-NOS 11/21/2007   TB SKIN TEST, POSITIVE 02/06/2010   THYROID NODULE 03/09/2010   Past Surgical History:  Procedure Laterality Date   ABDOMINAL HYSTERECTOMY     BASAL CELL CARCINOMA EXCISION     CARDIAC CATHETERIZATION     CHOLECYSTECTOMY N/A 05/11/2021   Procedure: LAPAROSCOPIC CHOLECYSTECTOMY WITH POSSIBLE  INTRAOPERATIVE CHOLANGIOGRAM;  Surgeon: Luretha Murphy, MD;  Location: [facility];   Service: General;  Laterality: N/A;   COLONOSCOPY     CORONARY ATHERECTOMY N/A 05/03/2022   Procedure: CORONARY ATHERECTOMY;  Surgeon: Orbie Pyo, MD;  Location: [facility] IN[facility] CV [facility];  Service: Cardiovascular;  Laterality: N/A;   CORONARY IMAGING/OCT N/A 05/03/2022   Procedure: INTRAVASCULAR IMAGING/OCT;  Surgeon: Orbie Pyo, MD;  Location: [facility] CV [facility];  Service: Cardiovascular;  Laterality: N/A;   CORONARY PRESSURE/FFR STUDY N/A 05/03/2022   Procedure: INTRAVASCULAR PRESSURE WIRE/FFR STUDY;  Surgeon: Orbie Pyo, MD;  Location: [facility];  Service: Cardiovascular;  Laterality: N/A;   CORONARY STENT INTERVENTION N/A 05/03/2022   Procedure: CORONARY STENT INTERVENTION;  Surgeon: Orbie Pyo, MD;  Location: [facility];  Service: Cardiovascular;  Laterality: N/A;  lad   EYE SURGERY Bilateral 08/2018, 09/2018   FOOT SURGERY Left    10/2019   KNEE ARTHROSCOPY Left    LEFT HEART CATH AND CORONARY ANGIOGRAPHY N/A 01/20/2019   Procedure: LEFT HEART CATH AND CORONARY ANGIOGRAPHY;  Surgeon: Lyn Records, MD;  Location: [facility];  Service: Cardiovascular;  Laterality: N/A;   LYMPH NODE DISSECTION     OOPHORECTOMY     Right total knee replacement     RIGHT/LEFT HEART CATH AND CORONARY ANGIOGRAPHY N/A 05/03/2022   Procedure: RIGHT/LEFT HEART CATH AND CORONARY ANGIOGRAPHY;  Surgeon: Orbie Pyo, MD;  Location: [facility];  Service: Cardiovascular;  Laterality: N/A;  ROUX-EN-Y GASTRIC BYPASS  2011   SKIN TAG REMOVAL     THYROIDECTOMY     TONSILLECTOMY AND ADENOIDECTOMY     TOOTH EXTRACTION  2023   UPPER GASTROINTESTINAL ENDOSCOPY     Patient Active Problem List   Diagnosis Date Noted   Bilateral carpal tunnel syndrome 03/13/2022   Leg edema 03/01/2022   Nondisplaced comminuted fracture of left patella, initial encounter for closed fracture 08/01/2021   Arthritis of carpometacarpal [facility]) joint of right thumb 07/28/2021   Primary  osteoarthritis of right distal radioulnar joint 07/28/2021   Arthritis of wrist, right, degenerative 07/28/2021   Osteopenia 01/14/2021   Sesamoiditis of left foot 11/09/2020   Posterior tibial tendinitis of left lower extremity 11/09/2020   Type 2 diabetes mellitus with diabetic peripheral angiopathy without gangrene, with long-term current use of insulin (HCC) 10/21/2020   Iron deficiency anemia 03/14/2020   Lesion of vulva 09/02/2019   Menorrhagia 09/02/2019   Neuropathy 09/02/2019   Irregular bowel habits 06/25/2019   Coronary artery disease involving native coronary artery of native heart without angina pectoris    Temporomandibular joint disorder 12/05/2018   Fatigue 12/05/2018   Inverted nipple 12/05/2018   Senile purpura (HCC) 11/21/2018   GERD with esophagitis 11/21/2018   Pseudogout involving multiple joints 11/21/2018   Lipoma 07/22/2017   Lichen sclerosus 11/16/2016   H/O gastric bypass 11/16/2016   Depression 11/16/2016   Diabetic polyneuropathy associated with type 2 diabetes mellitus (HCC) 11/16/2016   Uterine cancer (HCC) s/p hysterectomy 1997 01/04/2012   Postsurgical hypothyroidism 09/19/2010   Diabetic neuropathy (HCC) 11/21/2007   Allergic rhinitis 11/21/2007   Dyslipidemia associated with type 2 diabetes mellitus (HCC) 07/08/2007    PCP: [name], MD  REFERRING PROVIDER: Ardith Dark, MD  REFERRING DIAG: R60.0 (ICD-10-CM) - Leg edema  THERAPY DIAG:  Lymphedema, not elsewhere classified  Difficulty in walking, not elsewhere classified  Stiffness of left knee, not elsewhere classified  Acute pain of left knee  Muscle weakness (generalized)  Difficulty walking  Localized edema  Rationale for Evaluation and Treatment: Rehabilitation  ONSET DATE: Prior to 1997  SUBJECTIVE:                                                                                                                                                                                            SUBJECTIVE STATEMENT:  I am just not sure about doing this wrapping.   EVAL: I have been having swelling since before 1997 that was mostly in my ankles. Then after my surgery for uterine cancer it worsened on the L leg. I take lasix but it has minimal  effect. I have never had lymphedema therapy. I had prescription strength thigh highs but they did not help.   PERTINENT HISTORY:  Uterine cancer in 1997 with lymph node removal (pt reports the surgeon told her he got all the lymph nodes he could find bilaterally), pt reports her vascular doctor told her the left leg edema was vascular, diabetic, has hx of cardiac stent   PAIN:  Are you having pain? Yes: NPRS scale: 1/10 Pain location: goes up to an 8/10 when standing, lower back Pain description: sharp Aggravating factors: standing up, moving Relieving factors: sitting but not for too long in same position  PRECAUTIONS: Other: R Knee TKA  WEIGHT BEARING RESTRICTIONS: No  FALLS:  Has patient fallen in last 6 months? Yes. Number of falls 3 or 4 Falls due to feeling unsteady, has neuropathy, BP has been an issue - low  LIVING ENVIRONMENT: Lives with: lives alone Lives in: House/apartment town home Stairs: Yes; External: 8 steps; can reach both can go in back with 1 step Has following equipment at home: Single point cane  OCCUPATION: retired  LEISURE: pt has not exercised recently  HAND DOMINANCE: right   PRIOR LEVEL OF FUNCTION: Independent  PATIENT GOALS: to get the swelling down   OBJECTIVE:  COGNITION:  Overall cognitive status: Within functional limits for tasks assessed   PALPATION: Pitting edema throughout LLE, pitting edema at R ankle and foot  OBSERVATIONS / OTHER ASSESSMENTS: LLE about 30% larger than R throughout, R ankle full  SENSATION: pt reports neuropathy in bilateral feet   POSTURE: forward head, rounded shoulders   LYMPHEDEMA ASSESSMENTS:    LE LANDMARK RIGHT 03/11/2023  At groin    30 cm proximal to suprapatella   20 cm proximal to suprapatella 47  10 cm proximal to suprapatella 40.3  At midpatella / popliteal crease 39  30 cm proximal to floor at lateral plantar foot 32.1  20 cm proximal to floor at lateral plantar foot 26.1  10 cm proximal to floor at lateral plantar foot 25.5  Circumference of ankle/heel   5 cm proximal to 1st MTP joint 22.7  Across MTP joint 24  Around proximal great toe 8  (Blank rows = not tested)  LE LANDMARK LEFT 03/11/2023  At groin   30 cm proximal to suprapatella   20 cm proximal to suprapatella 46  10 cm proximal to suprapatella 41.2  At midpatella / popliteal crease 40.3  30 cm proximal to floor at lateral plantar foot 37.5  20 cm proximal to floor at lateral plantar foot 30.3  10 cm proximal to floor at lateral plantar foot 28.1  Circumference of ankle/heel   5 cm proximal to 1st MTP joint 25  Across MTP joint 23.4  Around proximal great toe 8.7  (Blank rows = not tested)    TODAY'S TREATMENT:  DATE:  03/11/23: Focused on bandaging set up and education today.  Large tg soft thigh, medium tg soft to lower leg, toes 1-4 with mollelast, rectangle rosidal soft and artiflex for foot and ankle shaping, then rosidal soft from ankle to thigh, 1 8cm roman sandal, 1 10cm ASH, 1 10cm from ankle to knee, 1 12cm from below then knee to the hip and then another 12cm from below the knee to the hip.   Education on when to remove bandages, bandage care and washing, flexitouch shown to patient and velcro options as pt is hesitant to do all of this bandaging.    02/27/23- none today due to time constraints  PATIENT EDUCATION:  Education details: anatomy and physiology of the lymphatic system, need for day and night time compression garments, compression bandaging vs compression garments, venous insufficiency  vs lymphedema Person educated: Patient Education method: Explanation Education comprehension: verbalized understanding  HOME EXERCISE PROGRAM: Get post op shoes and appropriate clothing to begin bandaging at next session  ASSESSMENT:  CLINICAL IMPRESSION: Pt tolerated the first session of bandaging well.  She feels like she could learn how to do it and would feel better about being more flexible with wearing it and being able to take it off.    OBJECTIVE IMPAIRMENTS: decreased knowledge of condition, decreased knowledge of use of DME, difficulty walking, increased edema, and pain.   ACTIVITY LIMITATIONS: standing and locomotion level  PARTICIPATION LIMITATIONS: community activity  PERSONAL FACTORS: Fitness and Time since onset of injury/illness/exacerbation are also affecting patient's functional outcome.   REHAB POTENTIAL: Good  CLINICAL DECISION MAKING: Evolving/moderate complexity  EVALUATION COMPLEXITY: Moderate   GOALS: Goals reviewed with patient? Yes  SHORT TERM GOALS: Target date: 03/27/23  Pt will demonstrate a 3 cm decrease in edema at 30 cm superior to floor at lateral malleoli.  Baseline: Goal status: INITIAL   LONG TERM GOALS: Target date: 04/24/23  Pt will be independent in self MLD for long term management of lymphedema.  Baseline:  Goal status: INITIAL  2.  Pt will receive trial of FlexiTouch compression pump for long term management of lymphedema.  Baseline:  Goal status: INITIAL  3.  Pt will obtain appropriate day and night time garments for long term management of lymphedema.  Baseline:  Goal status: INITIAL  4.  Pt will demonstrate maximal circumferential reduction to decrease risk of cellulitis and improve mobility.  Baseline:  Goal status: INITIAL    PLAN:  PT FREQUENCY: 3x/week  PT DURATION: 4 weeks  PLANNED INTERVENTIONS: Therapeutic exercises, Therapeutic activity, Patient/Family education, Self Care, Orthotic/Fit training, Manual  lymph drainage, Compression bandaging, Taping, Vasopneumatic device, Manual therapy, and Re-evaluation  PLAN FOR NEXT SESSION: send in  FlexiTouch demo, cont CDT to L LE - how was bandage? Make any changes as needed   Idamae Lusher, PT 03/11/2023, 5:00 PM

## 2023-03-12 NOTE — Telephone Encounter (Signed)
I will forward a note to Dr. Donata Clay nurse to reach out to the pt with an appt for 6 month f/u. Pt has been cleared for her procedure . See notes.

## 2023-03-12 NOTE — Telephone Encounter (Signed)
Patient informed she can hold Plavix per Cardiology 5 days prior to her procedure.

## 2023-03-12 NOTE — Telephone Encounter (Signed)
   Patient Name: [name]  DOB: 08/18/1956 MRN: [id number]  Primary Cardiologist: Parke Poisson, MD  Chart reviewed as part of pre-operative protocol coverage. Given past medical history and time since last visit, based on ACC/AHA guidelines, Savannah [name] is at acceptable risk for the planned procedure without further cardiovascular testing.   Patient can hold Plavix for 5 days prior to scheduled procedure.  In regards to her aspirin she should continue aspirin therapy with no interruption.  Please restart Plavix when surgically safe postprocedure.  I will route this recommendation to the requesting party via Epic fax function and remove from pre-op pool.  Please call with questions.  Napoleon Form, Leodis Rains, NP 03/12/2023, 7:27 AM

## 2023-03-13 ENCOUNTER — Encounter: Payer: Self-pay | Admitting: Rehabilitation

## 2023-03-13 ENCOUNTER — Ambulatory Visit: Payer: Medicare Other | Attending: Family Medicine | Admitting: Rehabilitation

## 2023-03-13 DIAGNOSIS — M25662 Stiffness of left knee, not elsewhere classified: Secondary | ICD-10-CM | POA: Diagnosis not present

## 2023-03-13 DIAGNOSIS — I890 Lymphedema, not elsewhere classified: Secondary | ICD-10-CM

## 2023-03-13 DIAGNOSIS — R262 Difficulty in walking, not elsewhere classified: Secondary | ICD-10-CM | POA: Insufficient documentation

## 2023-03-13 NOTE — Therapy (Signed)
OUTPATIENT PHYSICAL THERAPY LOWER EXTREMITY LYMPHEDEMA TREATMENT  Patient Name: Savannah [name] MRN: [id number] DOB:01/19/1956, 67 y.o., female Today's Date: 03/13/2023  END OF SESSION:  PT End of Session - 03/13/23 1049     Visit Number 3    Number of Visits 25    Date for PT Re-Evaluation 04/24/23    PT Start Time 1000    PT Stop Time 1049    PT Time Calculation (min) 49 min    Activity Tolerance Patient tolerated treatment well    Behavior During Therapy WFL for tasks assessed/performed              Past Medical History:  Diagnosis Date   ABDOMINAL PAIN, CHRONIC 10/20/2009   Acute cystitis 08/17/2009   ALLERGIC RHINITIS 11/21/2007   Allergy    ASYMPTOMATIC POSTMENOPAUSAL STATUS 09/29/2008   Bariatric surgery status 07/07/2010   Bariatric surgery status 07/07/2010   Qualifier: Diagnosis of  By: Everardo All MD, Sean A    Cancer [facility])    uterine   Cataract    Coronary artery disease    DEPRESSION 11/21/2007   DIABETES MELLITUS, TYPE II 07/08/2007   DYSPNEA 05/20/2009   Edema 05/20/2009   Eosinophilic esophagitis 02/13/2008   FEVER UNSPECIFIED 10/20/2009   FEVER, HX OF 03/09/2010   GERD (gastroesophageal reflux disease)    Headache(784.0) 02/06/2010   Hepatomegaly 01/06/2008   HYPERLIPIDEMIA 07/08/2007   HYPERTENSION 07/08/2007   LEUKOPENIA, MILD 09/29/2008   OBESITY 01/06/2008   OSTEOARTHRITIS 01/06/2008   Other chronic nonalcoholic liver disease 01/06/2008   PERIPHERAL NEUROPATHY 11/21/2007   Peripheral neuropathy    Postsurgical hypothyroidism 09/19/2010   SINUSITIS- ACUTE-NOS 11/21/2007   TB SKIN TEST, POSITIVE 02/06/2010   THYROID NODULE 03/09/2010   Past Surgical History:  Procedure Laterality Date   ABDOMINAL HYSTERECTOMY     BASAL CELL CARCINOMA EXCISION     CARDIAC CATHETERIZATION     CHOLECYSTECTOMY N/A 05/11/2021   Procedure: LAPAROSCOPIC CHOLECYSTECTOMY WITH POSSIBLE  INTRAOPERATIVE CHOLANGIOGRAM;  Surgeon: Luretha Murphy, MD;  Location: [facility];   Service: General;  Laterality: N/A;   COLONOSCOPY     CORONARY ATHERECTOMY N/A 05/03/2022   Procedure: CORONARY ATHERECTOMY;  Surgeon: Orbie Pyo, MD;  Location: [facility];  Service: Cardiovascular;  Laterality: N/A;   CORONARY IMAGING/OCT N/A 05/03/2022   Procedure: INTRAVASCULAR IMAGING/OCT;  Surgeon: Orbie Pyo, MD;  Location: [facility];  Service: Cardiovascular;  Laterality: N/A;   CORONARY PRESSURE/FFR STUDY N/A 05/03/2022   Procedure: INTRAVASCULAR PRESSURE WIRE/FFR STUDY;  Surgeon: Orbie Pyo, MD;  Location: [facility];  Service: Cardiovascular;  Laterality: N/A;   CORONARY STENT INTERVENTION N/A 05/03/2022   Procedure: CORONARY STENT INTERVENTION;  Surgeon: Orbie Pyo, MD;  Location: [facility];  Service: Cardiovascular;  Laterality: N/A;  lad   EYE SURGERY Bilateral 08/2018, 09/2018   FOOT SURGERY Left    10/2019   KNEE ARTHROSCOPY Left    LEFT HEART CATH AND CORONARY ANGIOGRAPHY N/A 01/20/2019   Procedure: LEFT HEART CATH AND CORONARY ANGIOGRAPHY;  Surgeon: Lyn Records, MD;  Location: [facility];  Service: Cardiovascular;  Laterality: N/A;   LYMPH NODE DISSECTION     OOPHORECTOMY     Right total knee replacement     RIGHT/LEFT HEART CATH AND CORONARY ANGIOGRAPHY N/A 05/03/2022   Procedure: RIGHT/LEFT HEART CATH AND CORONARY ANGIOGRAPHY;  Surgeon: Orbie Pyo, MD;  Location: [facility];  Service: Cardiovascular;  Laterality: N/A;  ROUX-EN-Y GASTRIC BYPASS  2011   SKIN TAG REMOVAL     THYROIDECTOMY     TONSILLECTOMY AND ADENOIDECTOMY     TOOTH EXTRACTION  2023   UPPER GASTROINTESTINAL ENDOSCOPY     Patient Active Problem List   Diagnosis Date Noted   Bilateral carpal tunnel syndrome 03/13/2022   Leg edema 03/01/2022   Nondisplaced comminuted fracture of left patella, initial encounter for closed fracture 08/01/2021   Arthritis of carpometacarpal (CMC) joint of right thumb 07/28/2021   Primary  osteoarthritis of right distal radioulnar joint 07/28/2021   Arthritis of wrist, right, degenerative 07/28/2021   Osteopenia 01/14/2021   Sesamoiditis of left foot 11/09/2020   Posterior tibial tendinitis of left lower extremity 11/09/2020   Type 2 diabetes mellitus with diabetic peripheral angiopathy without gangrene, with long-term current use of insulin (HCC) 10/21/2020   Iron deficiency anemia 03/14/2020   Lesion of vulva 09/02/2019   Menorrhagia 09/02/2019   Neuropathy 09/02/2019   Irregular bowel habits 06/25/2019   Coronary artery disease involving native coronary artery of native heart without angina pectoris    Temporomandibular joint disorder 12/05/2018   Fatigue 12/05/2018   Inverted nipple 12/05/2018   Senile purpura (HCC) 11/21/2018   GERD with esophagitis 11/21/2018   Pseudogout involving multiple joints 11/21/2018   Lipoma 07/22/2017   Lichen sclerosus 11/16/2016   H/O gastric bypass 11/16/2016   Depression 11/16/2016   Diabetic polyneuropathy associated with type 2 diabetes mellitus (HCC) 11/16/2016   Uterine cancer (HCC) s/p hysterectomy 1997 01/04/2012   Postsurgical hypothyroidism 09/19/2010   Diabetic neuropathy (HCC) 11/21/2007   Allergic rhinitis 11/21/2007   Dyslipidemia associated with type 2 diabetes mellitus (HCC) 07/08/2007    PCP: [name], MD  REFERRING PROVIDER: Ardith Dark, MD  REFERRING DIAG: R60.0 (ICD-10-CM) - Leg edema  THERAPY DIAG:  Lymphedema, not elsewhere classified  Difficulty in walking, not elsewhere classified  Rationale for Evaluation and Treatment: Rehabilitation  ONSET DATE: Prior to 1997  SUBJECTIVE:                                                                                                                                                                                           SUBJECTIVE STATEMENT:  The bandages stayed on until yesterday afternoon and then they were sliding too much. My leg looked much  better.   EVAL: I have been having swelling since before 1997 that was mostly in my ankles. Then after my surgery for uterine cancer it worsened on the L leg. I take lasix but it has minimal effect. I have never had lymphedema therapy. I had prescription strength thigh highs but  they did not help.   PERTINENT HISTORY:  Uterine cancer in 1997 with lymph node removal (pt reports the surgeon told her he got all the lymph nodes he could find bilaterally), pt reports her vascular doctor told her the left leg edema was vascular, diabetic, has hx of cardiac stent   PAIN:  Are you having pain? Yes: NPRS scale: 1/10 Pain location: goes up to an 8/10 when standing, lower back Pain description: sharp Aggravating factors: standing up, moving Relieving factors: sitting but not for too long in same position  PRECAUTIONS: Other: R Knee TKA  WEIGHT BEARING RESTRICTIONS: No  FALLS:  Has patient fallen in last 6 months? Yes. Number of falls 3 or 4 Falls due to feeling unsteady, has neuropathy, BP has been an issue - low  LIVING ENVIRONMENT: Lives with: lives alone Lives in: House/apartment town home Stairs: Yes; External: 8 steps; can reach both can go in back with 1 step Has following equipment at home: Single point cane  OCCUPATION: retired  LEISURE: pt has not exercised recently  HAND DOMINANCE: right   PRIOR LEVEL OF FUNCTION: Independent  PATIENT GOALS: to get the swelling down   OBJECTIVE:  COGNITION:  Overall cognitive status: Within functional limits for tasks assessed   PALPATION: Pitting edema throughout LLE, pitting edema at R ankle and foot  OBSERVATIONS / OTHER ASSESSMENTS: LLE about 30% larger than R throughout, R ankle full  SENSATION: pt reports neuropathy in bilateral feet   POSTURE: forward head, rounded shoulders   LYMPHEDEMA ASSESSMENTS:    LE LANDMARK RIGHT 03/13/2023  At groin   30 cm proximal to suprapatella   20 cm proximal to suprapatella 47  10 cm  proximal to suprapatella 40.3  At midpatella / popliteal crease 39  30 cm proximal to floor at lateral plantar foot 32.1  20 cm proximal to floor at lateral plantar foot 26.1  10 cm proximal to floor at lateral plantar foot 25.5  Circumference of ankle/heel   5 cm proximal to 1st MTP joint 22.7  Across MTP joint 24  Around proximal great toe 8  (Blank rows = not tested)  LE LANDMARK LEFT 03/13/2023  At groin   30 cm proximal to suprapatella   20 cm proximal to suprapatella 46  10 cm proximal to suprapatella 41.2  At midpatella / popliteal crease 40.3  30 cm proximal to floor at lateral plantar foot 37.5  20 cm proximal to floor at lateral plantar foot 30.3  10 cm proximal to floor at lateral plantar foot 28.1  Circumference of ankle/heel   5 cm proximal to 1st MTP joint 25  Across MTP joint 23.4  Around proximal great toe 8.7  (Blank rows = not tested)    TODAY'S TREATMENT:  DATE:  03/13/23 Supine legs on wedge: MLD: Left axillary nodes, Lt inguinal nodes, superficial and deep abdominals with breath, Lt inguinal-axillary anastomosis, then Lt LE working proximal to distal and then back proximally reversing all steps.  Focused on areas of increased swelling at the foot and ankle.   Wrapped LE in 2 pieces today to help with pt being able to rewrap the upper thigh: Large tg soft thigh, medium tg soft to lower leg, toes 1-4 with mollelast, rectangle rosidal soft and artiflex for foot and ankle shaping, then rosidal soft from ankle to knee and then knee to thigh, 1 8cm roman sandal, 1 10cm ASH, 1 10cm from ankle to knee, 1 12cm from below then knee to the hip and then another 12cm from below the knee to the hip.    03/11/23: Focused on bandaging set up and education today.  Large tg soft thigh, medium tg soft to lower leg, toes 1-4 with mollelast, rectangle  rosidal soft and artiflex for foot and ankle shaping, then rosidal soft from ankle to thigh, 1 8cm roman sandal, 1 10cm ASH, 1 10cm from ankle to knee, 1 12cm from below then knee to the hip and then another 12cm from below the knee to the hip.   Education on when to remove bandages, bandage care and washing, flexitouch shown to patient and velcro options as pt is hesitant to do all of this bandaging.    02/27/23- none today due to time constraints  PATIENT EDUCATION:  Education details: anatomy and physiology of the lymphatic system, need for day and night time compression garments, compression bandaging vs compression garments, venous insufficiency vs lymphedema Person educated: Patient Education method: Explanation Education comprehension: verbalized understanding  HOME EXERCISE PROGRAM: Get post op shoes and appropriate clothing to begin bandaging at next session  ASSESSMENT:  CLINICAL IMPRESSION: Pt tolerated first session of bandaging well and saw some improvements in her status.  Started MLD today with most fibrosis and edema noted at the foot and ankle.    OBJECTIVE IMPAIRMENTS: decreased knowledge of condition, decreased knowledge of use of DME, difficulty walking, increased edema, and pain.   ACTIVITY LIMITATIONS: standing and locomotion level  PARTICIPATION LIMITATIONS: community activity  PERSONAL FACTORS: Fitness and Time since onset of injury/illness/exacerbation are also affecting patient's functional outcome.   REHAB POTENTIAL: Good  CLINICAL DECISION MAKING: Evolving/moderate complexity  EVALUATION COMPLEXITY: Moderate   GOALS: Goals reviewed with patient? Yes  SHORT TERM GOALS: Target date: 03/27/23  Pt will demonstrate a 3 cm decrease in edema at 30 cm superior to floor at lateral malleoli.  Baseline: Goal status: INITIAL   LONG TERM GOALS: Target date: 04/24/23  Pt will be independent in self MLD for long term management of lymphedema.  Baseline:  Goal  status: INITIAL  2.  Pt will receive trial of FlexiTouch compression pump for long term management of lymphedema.  Baseline:  Goal status: INITIAL  3.  Pt will obtain appropriate day and night time garments for long term management of lymphedema.  Baseline:  Goal status: INITIAL  4.  Pt will demonstrate maximal circumferential reduction to decrease risk of cellulitis and improve mobility.  Baseline:  Goal status: INITIAL    PLAN:  PT FREQUENCY: 3x/week  PT DURATION: 4 weeks  PLANNED INTERVENTIONS: Therapeutic exercises, Therapeutic activity, Patient/Family education, Self Care, Orthotic/Fit training, Manual lymph drainage, Compression bandaging, Taping, Vasopneumatic device, Manual therapy, and Re-evaluation  PLAN FOR NEXT SESSION: send in  FlexiTouch demo, cont CDT to L  LE - how was bandage? Make any changes as needed   Idamae Lusher, PT 03/13/2023, 10:49 AM

## 2023-03-15 ENCOUNTER — Encounter: Payer: Self-pay | Admitting: Rehabilitation

## 2023-03-15 ENCOUNTER — Ambulatory Visit: Payer: Medicare Other | Admitting: Rehabilitation

## 2023-03-15 DIAGNOSIS — R262 Difficulty in walking, not elsewhere classified: Secondary | ICD-10-CM | POA: Diagnosis not present

## 2023-03-15 DIAGNOSIS — I890 Lymphedema, not elsewhere classified: Secondary | ICD-10-CM

## 2023-03-15 DIAGNOSIS — M25662 Stiffness of left knee, not elsewhere classified: Secondary | ICD-10-CM | POA: Diagnosis not present

## 2023-03-15 NOTE — Therapy (Signed)
OUTPATIENT PHYSICAL THERAPY LOWER EXTREMITY LYMPHEDEMA TREATMENT  Patient Name: Savannah [name] MRN: [id number] DOB:29-Jul-1956, 67 y.o., female Today's Date: 03/15/2023  END OF SESSION:  PT End of Session - 03/15/23 1101     Visit Number 4    Number of Visits 25    Date for PT Re-Evaluation 04/24/23    PT Start Time 1103    PT Stop Time 1200    PT Time Calculation (min) 57 min    Activity Tolerance Patient tolerated treatment well    Behavior During Therapy WFL for tasks assessed/performed               Past Medical History:  Diagnosis Date   ABDOMINAL PAIN, CHRONIC 10/20/2009   Acute cystitis 08/17/2009   ALLERGIC RHINITIS 11/21/2007   Allergy    ASYMPTOMATIC POSTMENOPAUSAL STATUS 09/29/2008   Bariatric surgery status 07/07/2010   Bariatric surgery status 07/07/2010   Qualifier: Diagnosis of  By: Everardo All MD, Sean A    Cancer [facility])    uterine   Cataract    Coronary artery disease    DEPRESSION 11/21/2007   DIABETES MELLITUS, TYPE II 07/08/2007   DYSPNEA 05/20/2009   Edema 05/20/2009   Eosinophilic esophagitis 02/13/2008   FEVER UNSPECIFIED 10/20/2009   FEVER, HX OF 03/09/2010   GERD (gastroesophageal reflux disease)    Headache(784.0) 02/06/2010   Hepatomegaly 01/06/2008   HYPERLIPIDEMIA 07/08/2007   HYPERTENSION 07/08/2007   LEUKOPENIA, MILD 09/29/2008   OBESITY 01/06/2008   OSTEOARTHRITIS 01/06/2008   Other chronic nonalcoholic liver disease 01/06/2008   PERIPHERAL NEUROPATHY 11/21/2007   Peripheral neuropathy    Postsurgical hypothyroidism 09/19/2010   SINUSITIS- ACUTE-NOS 11/21/2007   TB SKIN TEST, POSITIVE 02/06/2010   THYROID NODULE 03/09/2010   Past Surgical History:  Procedure Laterality Date   ABDOMINAL HYSTERECTOMY     BASAL CELL CARCINOMA EXCISION     CARDIAC CATHETERIZATION     CHOLECYSTECTOMY N/A 05/11/2021   Procedure: LAPAROSCOPIC CHOLECYSTECTOMY WITH POSSIBLE  INTRAOPERATIVE CHOLANGIOGRAM;  Surgeon: Luretha Murphy, MD;  Location: [facility]  ORS;  Service: General;  Laterality: N/A;   COLONOSCOPY     CORONARY ATHERECTOMY N/A 05/03/2022   Procedure: CORONARY ATHERECTOMY;  Surgeon: Orbie Pyo, MD;  Location: [facility] INV[facility] CV [facility];  Service: Cardiovascular;  Laterality: N/A;   CORONARY IMAGING/OCT N/A 05/03/2022   Procedure: INTRAVASCULAR IMAGING/OCT;  Surgeon: Orbie Pyo, MD;  Location: [facility] INV[facility] CV [facility];  Service: Cardiovascular;  Laterality: N/A;   CORONARY PRESSURE/FFR STUDY N/A 05/03/2022   Procedure: INTRAVASCULAR PRESSURE WIRE/FFR STUDY;  Surgeon: Orbie Pyo, MD;  Location: [facility];  Service: Cardiovascular;  Laterality: N/A;   CORONARY STENT INTERVENTION N/A 05/03/2022   Procedure: CORONARY STENT INTERVENTION;  Surgeon: Orbie Pyo, MD;  Location: [facility];  Service: Cardiovascular;  Laterality: N/A;  lad   EYE SURGERY Bilateral 08/2018, 09/2018   FOOT SURGERY Left    10/2019   KNEE ARTHROSCOPY Left    LEFT HEART CATH AND CORONARY ANGIOGRAPHY N/A 01/20/2019   Procedure: LEFT HEART CATH AND CORONARY ANGIOGRAPHY;  Surgeon: Lyn Records, MD;  Location: [facility];  Service: Cardiovascular;  Laterality: N/A;   LYMPH NODE DISSECTION     OOPHORECTOMY     Right total knee replacement     RIGHT/LEFT HEART CATH AND CORONARY ANGIOGRAPHY N/A 05/03/2022   Procedure: RIGHT/LEFT HEART CATH AND CORONARY ANGIOGRAPHY;  Surgeon: Orbie Pyo, MD;  Location: [facility];  Service: Cardiovascular;  Laterality:  N/A;   ROUX-EN-Y GASTRIC BYPASS  2011   SKIN TAG REMOVAL     THYROIDECTOMY     TONSILLECTOMY AND ADENOIDECTOMY     TOOTH EXTRACTION  2023   UPPER GASTROINTESTINAL ENDOSCOPY     Patient Active Problem List   Diagnosis Date Noted   Bilateral carpal tunnel syndrome 03/13/2022   Leg edema 03/01/2022   Nondisplaced comminuted fracture of left patella, initial encounter for closed fracture 08/01/2021   Arthritis of carpometacarpal (CMC) joint of right thumb 07/28/2021    Primary osteoarthritis of right distal radioulnar joint 07/28/2021   Arthritis of wrist, right, degenerative 07/28/2021   Osteopenia 01/14/2021   Sesamoiditis of left foot 11/09/2020   Posterior tibial tendinitis of left lower extremity 11/09/2020   Type 2 diabetes mellitus with diabetic peripheral angiopathy without gangrene, with long-term current use of insulin (HCC) 10/21/2020   Iron deficiency anemia 03/14/2020   Lesion of vulva 09/02/2019   Menorrhagia 09/02/2019   Neuropathy 09/02/2019   Irregular bowel habits 06/25/2019   Coronary artery disease involving native coronary artery of native heart without angina pectoris    Temporomandibular joint disorder 12/05/2018   Fatigue 12/05/2018   Inverted nipple 12/05/2018   Senile purpura (HCC) 11/21/2018   GERD with esophagitis 11/21/2018   Pseudogout involving multiple joints 11/21/2018   Lipoma 07/22/2017   Lichen sclerosus 11/16/2016   H/O gastric bypass 11/16/2016   Depression 11/16/2016   Diabetic polyneuropathy associated with type 2 diabetes mellitus (HCC) 11/16/2016   Uterine cancer (HCC) s/p hysterectomy 1997 01/04/2012   Postsurgical hypothyroidism 09/19/2010   Diabetic neuropathy (HCC) 11/21/2007   Allergic rhinitis 11/21/2007   Dyslipidemia associated with type 2 diabetes mellitus (HCC) 07/08/2007    PCP: [name], MD  REFERRING PROVIDER: Ardith Dark, MD  REFERRING DIAG: R60.0 (ICD-10-CM) - Leg edema  THERAPY DIAG:  Lymphedema, not elsewhere classified  Difficulty in walking, not elsewhere classified  Rationale for Evaluation and Treatment: Rehabilitation  ONSET DATE: Prior to 1997  SUBJECTIVE:                                                                                                                                                                                           SUBJECTIVE STATEMENT:   I made it until last night.  The worst is when things roll behind my knee.   EVAL: I have been  having swelling since before 1997 that was mostly in my ankles. Then after my surgery for uterine cancer it worsened on the L leg. I take lasix but it has minimal effect. I have never had lymphedema therapy. I had prescription strength thigh highs  but they did not help.   PERTINENT HISTORY:  Uterine cancer in 1997 with lymph node removal (pt reports the surgeon told her he got all the lymph nodes he could find bilaterally), pt reports her vascular doctor told her the left leg edema was vascular, diabetic, has hx of cardiac stent   PAIN:  Are you having pain? Yes: NPRS scale: 1/10 Pain location: goes up to an 8/10 when standing, lower back Pain description: sharp Aggravating factors: standing up, moving Relieving factors: sitting but not for too long in same position  PRECAUTIONS: Other: R Knee TKA  WEIGHT BEARING RESTRICTIONS: No  FALLS:  Has patient fallen in last 6 months? Yes. Number of falls 3 or 4 Falls due to feeling unsteady, has neuropathy, BP has been an issue - low  PATIENT GOALS: to get the swelling down   OBJECTIVE:  SENSATION: pt reports neuropathy in bilateral feet  LYMPHEDEMA ASSESSMENTS:   LE LANDMARK RIGHT EVAL  At groin   30 cm proximal to suprapatella   20 cm proximal to suprapatella 47  10 cm proximal to suprapatella 40.3  At midpatella / popliteal crease 39  30 cm proximal to floor at lateral plantar foot 32.1  20 cm proximal to floor at lateral plantar foot 26.1  10 cm proximal to floor at lateral plantar foot 25.5  Circumference of ankle/heel   5 cm proximal to 1st MTP joint 22.7  Across MTP joint 24  Around proximal great toe 8  (Blank rows = not tested)  LE LANDMARK LEFT EVAL  At groin   30 cm proximal to suprapatella   20 cm proximal to suprapatella 46  10 cm proximal to suprapatella 41.2  At midpatella / popliteal crease 40.3  30 cm proximal to floor at lateral plantar foot 37.5  20 cm proximal to floor at lateral plantar foot 30.3  10  cm proximal to floor at lateral plantar foot 28.1  Circumference of ankle/heel   5 cm proximal to 1st MTP joint 25  Across MTP joint 23.4  Around proximal great toe 8.7  (Blank rows = not tested)   TODAY'S TREATMENT:                                                                                                                                         DATE:  03/15/23 Supine legs on wedge: MLD: Left axillary nodes, Lt inguinal nodes, superficial and deep abdominals with breath, Lt inguinal-axillary anastomosis, then Lt LE working proximal to distal and then back proximally reversing all steps.  Focused on areas of increased swelling at the foot and ankle.   Large tg soft thigh, medium tg soft to lower leg, toes 1-4 with mollelast, rectangle rosidal soft and artiflex for foot and ankle shaping, then rosidal soft from ankle to knee and then knee to thigh, 1 8cm roman sandal, 1 10cm ASH, 1 10cm from  ankle to knee, 1 12cm from below then knee to the hip and then another 12cm from below the knee to the hip.    03/13/23 Supine legs on wedge: MLD: Left axillary nodes, Lt inguinal nodes, superficial and deep abdominals with breath, Lt inguinal-axillary anastomosis, then Lt LE working proximal to distal and then back proximally reversing all steps.  Focused on areas of increased swelling at the foot and ankle.   Wrapped LE in 2 pieces today to help with pt being able to rewrap the upper thigh: Large tg soft thigh, medium tg soft to lower leg, toes 1-4 with mollelast, rectangle rosidal soft and artiflex for foot and ankle shaping, then rosidal soft from ankle to knee and then knee to thigh, 1 8cm roman sandal, 1 10cm ASH, 1 10cm from ankle to knee, 1 12cm from below then knee to the hip and then another 12cm from below the knee to the hip.    03/11/23: Focused on bandaging set up and education today.  Large tg soft thigh, medium tg soft to lower leg, toes 1-4 with mollelast, rectangle rosidal soft and artiflex  for foot and ankle shaping, then rosidal soft from ankle to thigh, 1 8cm roman sandal, 1 10cm ASH, 1 10cm from ankle to knee, 1 12cm from below then knee to the hip and then another 12cm from below the knee to the hip.   Education on when to remove bandages, bandage care and washing, flexitouch shown to patient and velcro options as pt is hesitant to do all of this bandaging.    02/27/23- none today due to time constraints  PATIENT EDUCATION:  Education details: anatomy and physiology of the lymphatic system, need for day and night time compression garments, compression bandaging vs compression garments, venous insufficiency vs lymphedema Person educated: Patient Education method: Explanation Education comprehension: verbalized understanding  HOME EXERCISE PROGRAM: Get post op shoes and appropriate clothing to begin bandaging at next session  ASSESSMENT:  CLINICAL IMPRESSION: Pt is tolerating CDT well with her skin doing well.  She brought her own lotion today to use.  She also had her cast shoe.  Showed pt velcro and night garments and discussed how she would need custom to get no silicone on the thigh high.   OBJECTIVE IMPAIRMENTS: decreased knowledge of condition, decreased knowledge of use of DME, difficulty walking, increased edema, and pain.   ACTIVITY LIMITATIONS: standing and locomotion level  PARTICIPATION LIMITATIONS: community activity  PERSONAL FACTORS: Fitness and Time since onset of injury/illness/exacerbation are also affecting patient's functional outcome.   REHAB POTENTIAL: Good  CLINICAL DECISION MAKING: Evolving/moderate complexity  EVALUATION COMPLEXITY: Moderate   GOALS: Goals reviewed with patient? Yes  SHORT TERM GOALS: Target date: 03/27/23  Pt will demonstrate a 3 cm decrease in edema at 30 cm superior to floor at lateral malleoli.  Baseline: Goal status: INITIAL   LONG TERM GOALS: Target date: 04/24/23  Pt will be independent in self MLD for long  term management of lymphedema.  Baseline:  Goal status: INITIAL  2.  Pt will receive trial of FlexiTouch compression pump for long term management of lymphedema.  Baseline:  Goal status: INITIAL  3.  Pt will obtain appropriate day and night time garments for long term management of lymphedema.  Baseline:  Goal status: INITIAL  4.  Pt will demonstrate maximal circumferential reduction to decrease risk of cellulitis and improve mobility.  Baseline:  Goal status: INITIAL    PLAN:  PT FREQUENCY: 3x/week  PT  DURATION: 4 weeks  PLANNED INTERVENTIONS: Therapeutic exercises, Therapeutic activity, Patient/Family education, Self Care, Orthotic/Fit training, Manual lymph drainage, Compression bandaging, Taping, Vasopneumatic device, Manual therapy, and Re-evaluation  PLAN FOR NEXT SESSION: measure at least on Mondays. send in  FlexiTouch demo and notes around 5/20 if interested, cont CDT to L LE, teach pt bandaging and MLD as able.    Idamae Lusher, PT 03/15/2023, 12:07 PM

## 2023-03-18 ENCOUNTER — Telehealth: Payer: Self-pay | Admitting: Gastroenterology

## 2023-03-18 ENCOUNTER — Encounter: Payer: Self-pay | Admitting: Gastroenterology

## 2023-03-18 ENCOUNTER — Ambulatory Visit (AMBULATORY_SURGERY_CENTER): Payer: Medicare Other | Admitting: Gastroenterology

## 2023-03-18 ENCOUNTER — Encounter: Payer: Self-pay | Admitting: Rehabilitation

## 2023-03-18 ENCOUNTER — Ambulatory Visit: Payer: Medicare Other | Admitting: Rehabilitation

## 2023-03-18 VITALS — BP 133/70 | HR 64 | Temp 98.0°F | Resp 12 | Ht 71.0 in | Wt 176.0 lb

## 2023-03-18 DIAGNOSIS — R262 Difficulty in walking, not elsewhere classified: Secondary | ICD-10-CM | POA: Diagnosis not present

## 2023-03-18 DIAGNOSIS — R490 Dysphonia: Secondary | ICD-10-CM | POA: Diagnosis not present

## 2023-03-18 DIAGNOSIS — K219 Gastro-esophageal reflux disease without esophagitis: Secondary | ICD-10-CM | POA: Diagnosis not present

## 2023-03-18 DIAGNOSIS — R1310 Dysphagia, unspecified: Secondary | ICD-10-CM | POA: Diagnosis not present

## 2023-03-18 DIAGNOSIS — I890 Lymphedema, not elsewhere classified: Secondary | ICD-10-CM | POA: Diagnosis not present

## 2023-03-18 DIAGNOSIS — I2510 Atherosclerotic heart disease of native coronary artery without angina pectoris: Secondary | ICD-10-CM | POA: Diagnosis not present

## 2023-03-18 DIAGNOSIS — M25662 Stiffness of left knee, not elsewhere classified: Secondary | ICD-10-CM | POA: Diagnosis not present

## 2023-03-18 DIAGNOSIS — E119 Type 2 diabetes mellitus without complications: Secondary | ICD-10-CM | POA: Diagnosis not present

## 2023-03-18 DIAGNOSIS — K222 Esophageal obstruction: Secondary | ICD-10-CM | POA: Diagnosis not present

## 2023-03-18 DIAGNOSIS — K317 Polyp of stomach and duodenum: Secondary | ICD-10-CM

## 2023-03-18 MED ORDER — SODIUM CHLORIDE 0.9 % IV SOLN: 500.0000 mL | Freq: Once | INTRAVENOUS | Status: DC

## 2023-03-18 NOTE — Patient Instructions (Addendum)
Await pathology results.  Resume Plavix tomorrow.  Post dilation diet.  Handout on esophagitis and stricture/dilation and post dilation diet provided.  YOU HAD AN ENDOSCOPIC PROCEDURE TODAY AT THE [facility]:   Refer to the procedure report that was given to you for any specific questions about what was found during the examination.  If the procedure report does not answer your questions, please call your gastroenterologist to clarify.  If you requested that your care partner not be given the details of your procedure findings, then the procedure report has been included in a sealed envelope for you to review at your convenience later.  YOU SHOULD EXPECT: Some feelings of bloating in the abdomen. Passage of more gas than usual.  Walking can help get rid of the air that was put into your GI tract during the procedure and reduce the bloating. If you had a lower endoscopy (such as a colonoscopy or flexible sigmoidoscopy) you may notice spotting of blood in your stool or on the toilet paper. If you underwent a bowel prep for your procedure, you may not have a normal bowel movement for a few days.  Please Note:  You might notice some irritation and congestion in your nose or some drainage.  This is from the oxygen used during your procedure.  There is no need for concern and it should clear up in a day or so.  SYMPTOMS TO REPORT IMMEDIATELY:  Following upper endoscopy (EGD)  Vomiting of blood or coffee ground material  New chest pain or pain under the shoulder blades  Painful or persistently difficult swallowing  New shortness of breath  Fever of 100F or higher  Black, tarry-looking stools  For urgent or emergent issues, a gastroenterologist can be reached at any hour by calling [phone]. Do not use MyChart messaging for urgent concerns.    DIET:  Post dilation diet (see handout).  Drink plenty of fluids but you should avoid alcoholic beverages for 24 hours.  ACTIVITY:   You should plan to take it easy for the rest of today and you should NOT DRIVE or use heavy machinery until tomorrow (because of the sedation medicines used during the test).    FOLLOW UP: Our staff will call the number listed on your records the next business day following your procedure.  We will call around 7:15- 8:00 am to check on you and address any questions or concerns that you may have regarding the information given to you following your procedure. If we do not reach you, we will leave a message.     If any biopsies were taken you will be contacted by phone or by letter within the next 1-3 weeks.  Please call us at [phone] if you have not heard about the biopsies in 3 weeks.    SIGNATURES/CONFIDENTIALITY: You and/or your care partner have signed paperwork which will be entered into your electronic medical record.  These signatures attest to the fact that that the information above on your After Visit Summary has been reviewed and is understood.  Full responsibility of the confidentiality of this discharge information lies with you and/or your care-partner.

## 2023-03-18 NOTE — Therapy (Signed)
OUTPATIENT PHYSICAL THERAPY LOWER EXTREMITY LYMPHEDEMA TREATMENT  Patient Name: Savannah [name] MRN: [id number] DOB:1956-11-08, 67 y.o., female Today's Date: 03/18/2023  END OF SESSION:  PT End of Session - 03/18/23 1106     Visit Number 5    Number of Visits 25    Date for PT Re-Evaluation 04/24/23    PT Start Time 1110    Activity Tolerance Patient tolerated treatment well    Behavior During Therapy [facility] for tasks assessed/performed               Past Medical History:  Diagnosis Date   ABDOMINAL PAIN, CHRONIC 10/20/2009   Acute cystitis 08/17/2009   ALLERGIC RHINITIS 11/21/2007   Allergy    ASYMPTOMATIC POSTMENOPAUSAL STATUS 09/29/2008   Bariatric surgery status 07/07/2010   Bariatric surgery status 07/07/2010   Qualifier: Diagnosis of  By: Everardo All MD, Sean A    Cancer [facility])    uterine   Cataract    Coronary artery disease    DEPRESSION 11/21/2007   DIABETES MELLITUS, TYPE II 07/08/2007   DYSPNEA 05/20/2009   Edema 05/20/2009   Eosinophilic esophagitis 02/13/2008   FEVER UNSPECIFIED 10/20/2009   FEVER, HX OF 03/09/2010   GERD (gastroesophageal reflux disease)    Headache(784.0) 02/06/2010   Hepatomegaly 01/06/2008   HYPERLIPIDEMIA 07/08/2007   HYPERTENSION 07/08/2007   LEUKOPENIA, MILD 09/29/2008   OBESITY 01/06/2008   OSTEOARTHRITIS 01/06/2008   Other chronic nonalcoholic liver disease 01/06/2008   PERIPHERAL NEUROPATHY 11/21/2007   Peripheral neuropathy    Postsurgical hypothyroidism 09/19/2010   SINUSITIS- ACUTE-NOS 11/21/2007   TB SKIN TEST, POSITIVE 02/06/2010   THYROID NODULE 03/09/2010   Past Surgical History:  Procedure Laterality Date   ABDOMINAL HYSTERECTOMY     BASAL CELL CARCINOMA EXCISION     CARDIAC CATHETERIZATION     CHOLECYSTECTOMY N/A 05/11/2021   Procedure: LAPAROSCOPIC CHOLECYSTECTOMY WITH POSSIBLE  INTRAOPERATIVE CHOLANGIOGRAM;  Surgeon: Luretha Murphy, MD;  Location: [facility];  Service: General;  Laterality: N/A;   COLONOSCOPY      CORONARY ATHERECTOMY N/A 05/03/2022   Procedure: CORONARY ATHERECTOMY;  Surgeon: Orbie Pyo, MD;  Location: [facility];  Service: Cardiovascular;  Laterality: N/A;   CORONARY IMAGING/OCT N/A 05/03/2022   Procedure: INTRAVASCULAR IMAGING/OCT;  Surgeon: Orbie Pyo, MD;  Location: [facility];  Service: Cardiovascular;  Laterality: N/A;   CORONARY PRESSURE/FFR STUDY N/A 05/03/2022   Procedure: INTRAVASCULAR PRESSURE WIRE/FFR STUDY;  Surgeon: Orbie Pyo, MD;  Location: [facility];  Service: Cardiovascular;  Laterality: N/A;   CORONARY STENT INTERVENTION N/A 05/03/2022   Procedure: CORONARY STENT INTERVENTION;  Surgeon: Orbie Pyo, MD;  Location: [facility];  Service: Cardiovascular;  Laterality: N/A;  lad   EYE SURGERY Bilateral 08/2018, 09/2018   FOOT SURGERY Left    10/2019   KNEE ARTHROSCOPY Left    LEFT HEART CATH AND CORONARY ANGIOGRAPHY N/A 01/20/2019   Procedure: LEFT HEART CATH AND CORONARY ANGIOGRAPHY;  Surgeon: Lyn Records, MD;  Location: [facility];  Service: Cardiovascular;  Laterality: N/A;   LYMPH NODE DISSECTION     OOPHORECTOMY     Right total knee replacement     RIGHT/LEFT HEART CATH AND CORONARY ANGIOGRAPHY N/A 05/03/2022   Procedure: RIGHT/LEFT HEART CATH AND CORONARY ANGIOGRAPHY;  Surgeon: Orbie Pyo, MD;  Location: [facility];  Service: Cardiovascular;  Laterality: N/A;   ROUX-EN-Y GASTRIC BYPASS  2011   SKIN TAG REMOVAL  THYROIDECTOMY     TONSILLECTOMY AND ADENOIDECTOMY     TOOTH EXTRACTION  2023   UPPER GASTROINTESTINAL ENDOSCOPY     Patient Active Problem List   Diagnosis Date Noted   Bilateral carpal tunnel syndrome 03/13/2022   Leg edema 03/01/2022   Nondisplaced comminuted fracture of left patella, initial encounter for closed fracture 08/01/2021   Arthritis of carpometacarpal (CMC) joint of right thumb 07/28/2021   Primary osteoarthritis of right distal radioulnar joint  07/28/2021   Arthritis of wrist, right, degenerative 07/28/2021   Osteopenia 01/14/2021   Sesamoiditis of left foot 11/09/2020   Posterior tibial tendinitis of left lower extremity 11/09/2020   Type 2 diabetes mellitus with diabetic peripheral angiopathy without gangrene, with long-term current use of insulin (HCC) 10/21/2020   Iron deficiency anemia 03/14/2020   Lesion of vulva 09/02/2019   Menorrhagia 09/02/2019   Neuropathy 09/02/2019   Irregular bowel habits 06/25/2019   Coronary artery disease involving native coronary artery of native heart without angina pectoris    Temporomandibular joint disorder 12/05/2018   Fatigue 12/05/2018   Inverted nipple 12/05/2018   Senile purpura (HCC) 11/21/2018   GERD with esophagitis 11/21/2018   Pseudogout involving multiple joints 11/21/2018   Lipoma 07/22/2017   Lichen sclerosus 11/16/2016   H/O gastric bypass 11/16/2016   Depression 11/16/2016   Diabetic polyneuropathy associated with type 2 diabetes mellitus (HCC) 11/16/2016   Uterine cancer (HCC) s/p hysterectomy 1997 01/04/2012   Postsurgical hypothyroidism 09/19/2010   Diabetic neuropathy (HCC) 11/21/2007   Allergic rhinitis 11/21/2007   Dyslipidemia associated with type 2 diabetes mellitus (HCC) 07/08/2007    PCP: [name], MD  REFERRING PROVIDER: Ardith Dark, MD  REFERRING DIAG: R60.0 (ICD-10-CM) - Leg edema  THERAPY DIAG:  Lymphedema, not elsewhere classified  Difficulty in walking, not elsewhere classified  Rationale for Evaluation and Treatment: Rehabilitation  ONSET DATE: Prior to 1997  SUBJECTIVE:                                                                                                                                                                                           SUBJECTIVE STATEMENT:   I made it to about Saturday and then had to take it off.     EVAL: I have been having swelling since before 1997 that was mostly in my ankles. Then after  my surgery for uterine cancer it worsened on the L leg. I take lasix but it has minimal effect. I have never had lymphedema therapy. I had prescription strength thigh highs but they did not help.   PERTINENT HISTORY:  Uterine cancer in 1997 with lymph node removal (  pt reports the surgeon told her he got all the lymph nodes he could find bilaterally), pt reports her vascular doctor told her the left leg edema was vascular, diabetic, has hx of cardiac stent   PAIN:  Are you having pain? No  PRECAUTIONS: Other: R Knee TKA  WEIGHT BEARING RESTRICTIONS: No  FALLS:  Has patient fallen in last 6 months? Yes. Number of falls 3 or 4 Falls due to feeling unsteady, has neuropathy, BP has been an issue - low  PATIENT GOALS: to get the swelling down   OBJECTIVE:  SENSATION: pt reports neuropathy in bilateral feet  LYMPHEDEMA ASSESSMENTS:   LE LANDMARK RIGHT EVAL  At groin   30 cm proximal to suprapatella   20 cm proximal to suprapatella 47  10 cm proximal to suprapatella 40.3  At midpatella / popliteal crease 39  30 cm proximal to floor at lateral plantar foot 32.1  20 cm proximal to floor at lateral plantar foot 26.1  10 cm proximal to floor at lateral plantar foot 25.5  Circumference of ankle/heel   5 cm proximal to 1st MTP joint 22.7  Across MTP joint 24  Around proximal great toe 8  (Blank rows = not tested)  LE LANDMARK LEFT EVAL 03/18/23   At groin     30 cm proximal to suprapatella     20 cm proximal to suprapatella 46 47   10 cm proximal to suprapatella 41.2 43   At midpatella / popliteal crease 40.3 40.3   30 cm proximal to floor at lateral plantar foot 37.5 37.7   20 cm proximal to floor at lateral plantar foot 30.3 31.2   10 cm proximal to floor at lateral plantar foot 28.1 27.6   Circumference of ankle/heel     5 cm proximal to 1st MTP joint 25 24.7   Across MTP joint 23.4 24   Around proximal great toe 8.7 8.7   (Blank rows = not tested)   TODAY'S TREATMENT:                                                                                                                                          DATE:  03/18/23 Remeasured leg Started education on self bandaging. Gave link to MD anderson bandaging video. Verbal instruction, demo, and cueing to complete bandage.  Pt is able to complete the foot part in seated with the leg crossed over at the ankle and was able to reach toes 1-3.  Applied all bandages successfully with cueing and education.  Gave pt new set of bandages and new tg soft. Then PT reapplied bandages and donned cast shoe.   Pt will apply bandages each morning after showering Pt may be interested in velcro  03/15/23 Supine legs on wedge: MLD: Left axillary nodes, Lt inguinal nodes, superficial and deep abdominals with breath, Lt inguinal-axillary anastomosis, then Lt LE working proximal  to distal and then back proximally reversing all steps.  Focused on areas of increased swelling at the foot and ankle.   Large tg soft thigh, medium tg soft to lower leg, toes 1-4 with mollelast, rectangle rosidal soft and artiflex for foot and ankle shaping, then rosidal soft from ankle to knee and then knee to thigh, 1 8cm roman sandal, 1 10cm ASH, 1 10cm from ankle to knee, 1 12cm from below then knee to the hip and then another 12cm from below the knee to the hip.    03/13/23 Supine legs on wedge: MLD: Left axillary nodes, Lt inguinal nodes, superficial and deep abdominals with breath, Lt inguinal-axillary anastomosis, then Lt LE working proximal to distal and then back proximally reversing all steps.  Focused on areas of increased swelling at the foot and ankle.   Wrapped LE in 2 pieces today to help with pt being able to rewrap the upper thigh: Large tg soft thigh, medium tg soft to lower leg, toes 1-4 with mollelast, rectangle rosidal soft and artiflex for foot and ankle shaping, then rosidal soft from ankle to knee and then knee to thigh, 1 8cm roman sandal, 1 10cm ASH, 1  10cm from ankle to knee, 1 12cm from below then knee to the hip and then another 12cm from below the knee to the hip.    03/11/23: Focused on bandaging set up and education today.  Large tg soft thigh, medium tg soft to lower leg, toes 1-4 with mollelast, rectangle rosidal soft and artiflex for foot and ankle shaping, then rosidal soft from ankle to thigh, 1 8cm roman sandal, 1 10cm ASH, 1 10cm from ankle to knee, 1 12cm from below then knee to the hip and then another 12cm from below the knee to the hip.   Education on when to remove bandages, bandage care and washing, flexitouch shown to patient and velcro options as pt is hesitant to do all of this bandaging.    02/27/23- none today due to time constraints  PATIENT EDUCATION:  Education details: anatomy and physiology of the lymphatic system, need for day and night time compression garments, compression bandaging vs compression garments, venous insufficiency vs lymphedema Person educated: Patient Education method: Explanation Education comprehension: verbalized understanding  HOME EXERCISE PROGRAM: Get post op shoes and appropriate clothing to begin bandaging at next session  ASSESSMENT:  CLINICAL IMPRESSION: Leg shows no decrease in size over 1 week of bandaging most likely due to keeping the bandages on around 50% of the time.  Started to educate pt on bandaging so she can reapply them more often.  Pt was able to reach her foot and complete the whole bandage.    OBJECTIVE IMPAIRMENTS: decreased knowledge of condition, decreased knowledge of use of DME, difficulty walking, increased edema, and pain.   ACTIVITY LIMITATIONS: standing and locomotion level  PARTICIPATION LIMITATIONS: community activity  PERSONAL FACTORS: Fitness and Time since onset of injury/illness/exacerbation are also affecting patient's functional outcome.   REHAB POTENTIAL: Good  CLINICAL DECISION MAKING: Evolving/moderate complexity  EVALUATION COMPLEXITY:  Moderate   GOALS: Goals reviewed with patient? Yes  SHORT TERM GOALS: Target date: 03/27/23  Pt will demonstrate a 3 cm decrease in edema at 30 cm superior to floor at lateral malleoli.  Baseline: Goal status: INITIAL   LONG TERM GOALS: Target date: 04/24/23  Pt will be independent in self MLD for long term management of lymphedema.  Baseline:  Goal status: INITIAL  2.  Pt will receive trial  of FlexiTouch compression pump for long term management of lymphedema.  Baseline:  Goal status: INITIAL  3.  Pt will obtain appropriate day and night time garments for long term management of lymphedema.  Baseline:  Goal status: INITIAL  4.  Pt will demonstrate maximal circumferential reduction to decrease risk of cellulitis and improve mobility.  Baseline:  Goal status: INITIAL    PLAN:  PT FREQUENCY: 3x/week  PT DURATION: 4 weeks  PLANNED INTERVENTIONS: Therapeutic exercises, Therapeutic activity, Patient/Family education, Self Care, Orthotic/Fit training, Manual lymph drainage, Compression bandaging, Taping, Vasopneumatic device, Manual therapy, and Re-evaluation  PLAN FOR NEXT SESSION: how was self bandaging? Like to order velcro instead? measure at least on Mondays. send in pump demo and notes around 5/20 if interested, cont CDT to L LE, teach pt bandaging and MLD as able.    Idamae Lusher, PT 03/18/2023, 11:06 AM

## 2023-03-18 NOTE — Telephone Encounter (Signed)
PT is scheduled for EGD today at 4pm and is going in for lymphedema wrapping today and wants to know will it affect the procedure. Please advise

## 2023-03-18 NOTE — Telephone Encounter (Signed)
Dr. Adela Lank, patient was instructed that she is OK to come for the EGD.Thank you

## 2023-03-18 NOTE — Progress Notes (Signed)
Called to room to assist during endoscopic procedure.  Patient ID and intended procedure confirmed with present staff. Received instructions for my participation in the procedure from the performing physician.  

## 2023-03-18 NOTE — Op Note (Addendum)
[facility] Patient Name: Savannah [name] Procedure Date: 03/18/2023 3:42 PM MRN: [id number] Endoscopist: Viviann Spare P. Adela Lank , MD, [id number] Age: 67 Referring MD:  Date of Birth: 10/10/1956 Gender: Female Account #: [id number] Procedure:                Upper GI endoscopy Indications:              Dysphagia, hoarseness, history of gastro-esophageal                            reflux disease on protonix twice daily. Reported                            history of EoE years ago Medicines:                Monitored Anesthesia Care Procedure:                Pre-Anesthesia Assessment:                           - Prior to the procedure, a History and Physical                            was performed, and patient medications and                            allergies were reviewed. The patient's tolerance of                            previous anesthesia was also reviewed. The risks                            and benefits of the procedure and the sedation                            options and risks were discussed with the patient.                            All questions were answered, and informed consent                            was obtained. Prior Anticoagulants: The patient has                            taken Plavix (clopidogrel), last dose was 5 days                            prior to procedure. ASA Grade Assessment: III - A                            patient with severe systemic disease. After                            reviewing the risks and benefits, the patient was  deemed in satisfactory condition to undergo the                            procedure.                           After obtaining informed consent, the endoscope was                            passed under direct vision. Throughout the                            procedure, the patient's blood pressure, pulse, and                            oxygen saturations were monitored continuously. The                             GIF [id number] #[id number] was introduced through the                            mouth, and advanced to the jejunum. The upper GI                            endoscopy was accomplished without difficulty. The                            patient tolerated the procedure well. Scope In: Scope Out: Findings:                 Esophagogastric landmarks were identified: the                            Z-line was found at 38 cm, the gastroesophageal                            junction was found at 38 cm and the upper extent of                            the gastric folds was found at 40 cm from the                            incisors.                           A 2 cm hiatal hernia was present.                           One benign-appearing, intrinsic mild stenosis was                            found at the GEJ. This stenosis measured less than                            one cm (in length).  A TTS dilator was passed                            through the scope. Dilation with an 18-19-20 mm                            balloon dilator was performed to 18 mm, 19 mm and                            20 mm with appropriate dilation effect. Stricture                            was opened further with biopsy forceps.                           The exam of the esophagus was otherwise normal.                           Biopsies were obtained from the proximal and distal                            esophagus with cold forceps for histology to rule                            out eosinophilic esophagitis given reported history                            of this.                           Evidence of a Roux-en-Y gastrojejunostomy was                            found. The gastrojejunal anastomosis was                            characterized by healthy appearing mucosa.                           A few small sessile polyps were found in the                            gastric body. Biopsies were taken  with a cold                            forceps for histology.                           The gastric pouch was larger than expected post                            Roux-en-Y. The exam of the gastric pouch was  otherwise normal.                           The examined small bowel limb and blind pouch was                            normal. Complications:            No immediate complications. Estimated blood loss:                            Minimal. Estimated Blood Loss:     Estimated blood loss was minimal. Impression:               - Esophagogastric landmarks identified.                           - 2 cm hiatal hernia.                           - Benign-appearing esophageal stenosis. Dilated to                            20mm with good result.                           - Normal esophagus otherwise - no active                            inflammation - biopsies taken given reported                            history of EoE years ago.                           - Roux-en-Y gastrojejunostomy with gastrojejunal                            anastomosis characterized by healthy appearing                            mucosa.                           - A few gastric polyps. Biopsied.                           - Normal examined small bowel limb. Recommendation:           - Patient has a contact number available for                            emergencies. The signs and symptoms of potential                            delayed complications were discussed with the  patient. Return to normal activities tomorrow.                            Written discharge instructions were provided to the                            patient.                           - Post dilation.                           - Continue present medications.                           - Resume Plavix tomorrow                           - Await pathology results and course post  dilation Syon Tews P. Tenika Keeran, MD 03/18/2023 4:17:02 PM This report has been signed electronically.

## 2023-03-18 NOTE — Telephone Encounter (Signed)
Yes I think that is fine, thanks for letting me know.

## 2023-03-18 NOTE — Progress Notes (Signed)
Report to PACU, RN, vss, BBS= Clear.  

## 2023-03-18 NOTE — Progress Notes (Signed)
VS completed by DT.  Pt's states no medical or surgical changes since previsit or office visit.  

## 2023-03-18 NOTE — Progress Notes (Signed)
History and Physical Interval Note: Seen on 03/07/23 - no interval changes. Plavix held for 5 days. EGD to further evaluate symptoms as outlined - reported history of EoE remotely however last exam did not show this. Have discussed risks / benefits of the exam and she wishes to proceed. On protonix 40mg  BID.    03/18/2023 3:49 PM  Savannah [name]  has presented today for endoscopic procedure(s), with the diagnosis of  Encounter Diagnoses  Name Primary?   Dysphagia, unspecified type Yes   Hoarseness    Gastroesophageal reflux disease, unspecified whether esophagitis present   .  The various methods of evaluation and treatment have been discussed with the patient and/or family. After consideration of risks, benefits and other options for treatment, the patient has consented to  the endoscopic procedure(s).   The patient's history has been reviewed, patient examined, no change in status, stable for surgery.  I have reviewed the patient's chart and labs.  Questions were answered to the patient's satisfaction.    [name], MD [facility]

## 2023-03-19 ENCOUNTER — Telehealth: Payer: Self-pay

## 2023-03-19 NOTE — Telephone Encounter (Signed)
Attempted follow up call; no answer; unable to leave vm

## 2023-03-20 ENCOUNTER — Encounter: Payer: Self-pay | Admitting: Family Medicine

## 2023-03-20 ENCOUNTER — Ambulatory Visit: Payer: Medicare Other

## 2023-03-20 DIAGNOSIS — R262 Difficulty in walking, not elsewhere classified: Secondary | ICD-10-CM

## 2023-03-20 DIAGNOSIS — M25662 Stiffness of left knee, not elsewhere classified: Secondary | ICD-10-CM | POA: Diagnosis not present

## 2023-03-20 DIAGNOSIS — D2272 Melanocytic nevi of left lower limb, including hip: Secondary | ICD-10-CM | POA: Diagnosis not present

## 2023-03-20 DIAGNOSIS — Z86018 Personal history of other benign neoplasm: Secondary | ICD-10-CM | POA: Diagnosis not present

## 2023-03-20 DIAGNOSIS — L900 Lichen sclerosus et atrophicus: Secondary | ICD-10-CM | POA: Diagnosis not present

## 2023-03-20 DIAGNOSIS — L821 Other seborrheic keratosis: Secondary | ICD-10-CM | POA: Diagnosis not present

## 2023-03-20 DIAGNOSIS — I890 Lymphedema, not elsewhere classified: Secondary | ICD-10-CM

## 2023-03-20 DIAGNOSIS — D225 Melanocytic nevi of trunk: Secondary | ICD-10-CM | POA: Diagnosis not present

## 2023-03-20 DIAGNOSIS — D2271 Melanocytic nevi of right lower limb, including hip: Secondary | ICD-10-CM | POA: Diagnosis not present

## 2023-03-20 DIAGNOSIS — D2230 Melanocytic nevi of unspecified part of face: Secondary | ICD-10-CM | POA: Diagnosis not present

## 2023-03-20 DIAGNOSIS — Z85828 Personal history of other malignant neoplasm of skin: Secondary | ICD-10-CM | POA: Diagnosis not present

## 2023-03-20 DIAGNOSIS — L570 Actinic keratosis: Secondary | ICD-10-CM | POA: Diagnosis not present

## 2023-03-20 DIAGNOSIS — L578 Other skin changes due to chronic exposure to nonionizing radiation: Secondary | ICD-10-CM | POA: Diagnosis not present

## 2023-03-20 NOTE — Therapy (Signed)
OUTPATIENT PHYSICAL THERAPY LOWER EXTREMITY LYMPHEDEMA TREATMENT  Patient Name: Savannah [name] MRN: [id number] DOB:22-Mar-1956, 67 y.o., female Today's Date: 03/20/2023  END OF SESSION:  PT End of Session - 03/20/23 1211     Visit Number 6    Number of Visits 25    Date for PT Re-Evaluation 04/24/23    PT Start Time 1108    PT Stop Time 1208    PT Time Calculation (min) 60 min    Activity Tolerance Patient tolerated treatment well    Behavior During Therapy [facility] for tasks assessed/performed               Past Medical History:  Diagnosis Date   ABDOMINAL PAIN, CHRONIC 10/20/2009   Acute cystitis 08/17/2009   ALLERGIC RHINITIS 11/21/2007   Allergy    ASYMPTOMATIC POSTMENOPAUSAL STATUS 09/29/2008   Bariatric surgery status 07/07/2010   Bariatric surgery status 07/07/2010   Qualifier: Diagnosis of  By: Everardo All MD, Sean A    Cancer [facility])    uterine   Cataract    Coronary artery disease    DEPRESSION 11/21/2007   DIABETES MELLITUS, TYPE II 07/08/2007   DYSPNEA 05/20/2009   Edema 05/20/2009   Eosinophilic esophagitis 02/13/2008   FEVER UNSPECIFIED 10/20/2009   FEVER, HX OF 03/09/2010   GERD (gastroesophageal reflux disease)    Headache(784.0) 02/06/2010   Hepatomegaly 01/06/2008   HYPERLIPIDEMIA 07/08/2007   HYPERTENSION 07/08/2007   LEUKOPENIA, MILD 09/29/2008   OBESITY 01/06/2008   OSTEOARTHRITIS 01/06/2008   Other chronic nonalcoholic liver disease 01/06/2008   PERIPHERAL NEUROPATHY 11/21/2007   Peripheral neuropathy    Postsurgical hypothyroidism 09/19/2010   SINUSITIS- ACUTE-NOS 11/21/2007   TB SKIN TEST, POSITIVE 02/06/2010   THYROID NODULE 03/09/2010   Past Surgical History:  Procedure Laterality Date   ABDOMINAL HYSTERECTOMY     BASAL CELL CARCINOMA EXCISION     CARDIAC CATHETERIZATION     CHOLECYSTECTOMY N/A 05/11/2021   Procedure: LAPAROSCOPIC CHOLECYSTECTOMY WITH POSSIBLE  INTRAOPERATIVE CHOLANGIOGRAM;  Surgeon: Luretha Murphy, MD;  Location: [facility]  ORS;  Service: General;  Laterality: N/A;   COLONOSCOPY     CORONARY ATHERECTOMY N/A 05/03/2022   Procedure: CORONARY ATHERECTOMY;  Surgeon: Orbie Pyo, MD;  Location: [facility] INVASIVE CV LAB;  Service: Cardiovascular;  Laterality: N/A;   CORONARY IMAGING/OCT N/A 05/03/2022   Procedure: INTRAVASCULAR IMAGING/OCT;  Surgeon: Orbie Pyo, MD;  Location: [facility] INVASIVE CV LAB;  Service: Cardiovascular;  Laterality: N/A;   CORONARY PRESSURE/FFR STUDY N/A 05/03/2022   Procedure: INTRAVASCULAR PRESSURE WIRE/FFR STUDY;  Surgeon: Orbie Pyo, MD;  Location: [facility] IN[facility] CV [facility];  Service: Cardiovascular;  Laterality: N/A;   CORONARY STENT INTERVENTION N/A 05/03/2022   Procedure: CORONARY STENT INTERVENTION;  Surgeon: Orbie Pyo, MD;  Location: [facility];  Service: Cardiovascular;  Laterality: N/A;  lad   EYE SURGERY Bilateral 08/2018, 09/2018   FOOT SURGERY Left    10/2019   KNEE ARTHROSCOPY Left    LEFT HEART CATH AND CORONARY ANGIOGRAPHY N/A 01/20/2019   Procedure: LEFT HEART CATH AND CORONARY ANGIOGRAPHY;  Surgeon: Lyn Records, MD;  Location: [facility];  Service: Cardiovascular;  Laterality: N/A;   LYMPH NODE DISSECTION     OOPHORECTOMY     Right total knee replacement     RIGHT/LEFT HEART CATH AND CORONARY ANGIOGRAPHY N/A 05/03/2022   Procedure: RIGHT/LEFT HEART CATH AND CORONARY ANGIOGRAPHY;  Surgeon: Orbie Pyo, MD;  Location: [facility];  Service: Cardiovascular;  Laterality:  N/A;   ROUX-EN-Y GASTRIC BYPASS  2011   SKIN TAG REMOVAL     THYROIDECTOMY     TONSILLECTOMY AND ADENOIDECTOMY     TOOTH EXTRACTION  2023   UPPER GASTROINTESTINAL ENDOSCOPY     Patient Active Problem List   Diagnosis Date Noted   Bilateral carpal tunnel syndrome 03/13/2022   Leg edema 03/01/2022   Nondisplaced comminuted fracture of left patella, initial encounter for closed fracture 08/01/2021   Arthritis of carpometacarpal (CMC) joint of right thumb 07/28/2021    Primary osteoarthritis of right distal radioulnar joint 07/28/2021   Arthritis of wrist, right, degenerative 07/28/2021   Osteopenia 01/14/2021   Sesamoiditis of left foot 11/09/2020   Posterior tibial tendinitis of left lower extremity 11/09/2020   Type 2 diabetes mellitus with diabetic peripheral angiopathy without gangrene, with long-term current use of insulin (HCC) 10/21/2020   Iron deficiency anemia 03/14/2020   Lesion of vulva 09/02/2019   Menorrhagia 09/02/2019   Neuropathy 09/02/2019   Irregular bowel habits 06/25/2019   Coronary artery disease involving native coronary artery of native heart without angina pectoris    Temporomandibular joint disorder 12/05/2018   Fatigue 12/05/2018   Inverted nipple 12/05/2018   Senile purpura (HCC) 11/21/2018   GERD with esophagitis 11/21/2018   Pseudogout involving multiple joints 11/21/2018   Lipoma 07/22/2017   Lichen sclerosus 11/16/2016   H/O gastric bypass 11/16/2016   Depression 11/16/2016   Diabetic polyneuropathy associated with type 2 diabetes mellitus (HCC) 11/16/2016   Uterine cancer (HCC) s/p hysterectomy 1997 01/04/2012   Postsurgical hypothyroidism 09/19/2010   Diabetic neuropathy (HCC) 11/21/2007   Allergic rhinitis 11/21/2007   Dyslipidemia associated with type 2 diabetes mellitus (HCC) 07/08/2007    PCP: [name], MD  REFERRING PROVIDER: Ardith Dark, MD  REFERRING DIAG: R60.0 (ICD-10-CM) - Leg edema  THERAPY DIAG:  Lymphedema, not elsewhere classified  Difficulty in walking, not elsewhere classified  Stiffness of left knee, not elsewhere classified  Rationale for Evaluation and Treatment: Rehabilitation  ONSET DATE: Prior to 1997  SUBJECTIVE:                                                                                                                                                                                           SUBJECTIVE STATEMENT:   I left it on since I was here last. She used one  long piece of TG soft lat time and that seemed to really help it sty in place better. I do want to see if you can get it higher on my leg thigh though because it starts to pinch when it slides down my mid thigh.  EVAL: I have been having swelling since before 1997 that was mostly in my ankles. Then after my surgery for uterine cancer it worsened on the L leg. I take lasix but it has minimal effect. I have never had lymphedema therapy. I had prescription strength thigh highs but they did not help.   PERTINENT HISTORY:  Uterine cancer in 1997 with lymph node removal (pt reports the surgeon told her he got all the lymph nodes he could find bilaterally), pt reports her vascular doctor told her the left leg edema was vascular, diabetic, has hx of cardiac stent   PAIN:  Are you having pain? No  PRECAUTIONS: Other: R Knee TKA  WEIGHT BEARING RESTRICTIONS: No  FALLS:  Has patient fallen in last 6 months? Yes. Number of falls 3 or 4 Falls due to feeling unsteady, has neuropathy, BP has been an issue - low  PATIENT GOALS: to get the swelling down   OBJECTIVE:  SENSATION: pt reports neuropathy in bilateral feet  LYMPHEDEMA ASSESSMENTS:   LE LANDMARK RIGHT EVAL  At groin   30 cm proximal to suprapatella   20 cm proximal to suprapatella 47  10 cm proximal to suprapatella 40.3  At midpatella / popliteal crease 39  30 cm proximal to floor at lateral plantar foot 32.1  20 cm proximal to floor at lateral plantar foot 26.1  10 cm proximal to floor at lateral plantar foot 25.5  Circumference of ankle/heel   5 cm proximal to 1st MTP joint 22.7  Across MTP joint 24  Around proximal great toe 8  (Blank rows = not tested)  LE LANDMARK LEFT EVAL 03/18/23   At groin     30 cm proximal to suprapatella     20 cm proximal to suprapatella 46 47   10 cm proximal to suprapatella 41.2 43   At midpatella / popliteal crease 40.3 40.3   30 cm proximal to floor at lateral plantar foot 37.5 37.7   20 cm  proximal to floor at lateral plantar foot 30.3 31.2   10 cm proximal to floor at lateral plantar foot 28.1 27.6   Circumference of ankle/heel     5 cm proximal to 1st MTP joint 25 24.7   Across MTP joint 23.4 24   Around proximal great toe 8.7 8.7   (Blank rows = not tested)   TODAY'S TREATMENT:                                                                                                                                         DATE:  03/20/23: Manual Therapy MLD to Lt LE as follows: Supine with bil LE's on bolster: Short neck, superficial and deep abdominals, Left axillary nodes, Lt inguino-axillary anastomosis, then Lt LE working proximal to distal and then back proximally reversing all steps.  Focused on areas of increased swelling at the foot and ankle.  Compression Bandaging to Lt LE as follows: Cocoa butter to Lt LE including foot/ankle, TG soft x1 from foot to thigh, Elastomull to toes 1-4, Artiflex to foot/ankle, 1/2" gray compression foam to dorsal foot covering lateral malleolous, and 1/2" gray foam "wagon wheel" to medial malleolous fixated with 6 cm in Roman sandal, then 8 cm ASH, 2 - 10 cm from ankle to knee, first spiral, next herring bone, then 2 - 12 cm, first starting below knee with "X" at popliteal fossa and next start at knee and spiral to near groin.   03/18/23 Remeasured leg Started education on self bandaging. Gave link to MD anderson bandaging video. Verbal instruction, demo, and cueing to complete bandage.  Pt is able to complete the foot part in seated with the leg crossed over at the ankle and was able to reach toes 1-3.  Applied all bandages successfully with cueing and education.  Gave pt new set of bandages and new tg soft. Then PT reapplied bandages and donned cast shoe.   Pt will apply bandages each morning after showering Pt may be interested in velcro  03/15/23 Supine legs on wedge: MLD: Left axillary nodes, Lt inguinal nodes, superficial and deep abdominals with  breath, Lt inguinal-axillary anastomosis, then Lt LE working proximal to distal and then back proximally reversing all steps.  Focused on areas of increased swelling at the foot and ankle.   Large tg soft thigh, medium tg soft to lower leg, toes 1-4 with mollelast, rectangle rosidal soft and artiflex for foot and ankle shaping, then rosidal soft from ankle to knee and then knee to thigh, 1 8cm roman sandal, 1 10cm ASH, 1 10cm from ankle to knee, 1 12cm from below then knee to the hip and then another 12cm from below the knee to the hip.       PATIENT EDUCATION:  Education details: anatomy and physiology of the lymphatic system, need for day and night time compression garments, compression bandaging vs compression garments, venous insufficiency vs lymphedema Person educated: Patient Education method: Explanation Education comprehension: verbalized understanding  HOME EXERCISE PROGRAM: Get post op shoes and appropriate clothing to begin bandaging at next session  ASSESSMENT:  CLINICAL IMPRESSION: Pt came in today reporting the bandage did stay better with one stockinette on LE so she was able to leave them on. She did request bandage come closer to groin due to when it slides down her thigh it would pinch where it would settle. Pt also had visible increased swelling at foot/ankle per her report, since last session, so changed to 1/2" gray compression foam at foot/ankle to increase gradient of compression here. Also took last 12 cm bandage to near groin. Pt reports bandage comfortable at end of session. She was encouraged to try rewrapping her last 1 or 2 12 cm bandages if they slide down before last session. Pt verbalized understanding.   OBJECTIVE IMPAIRMENTS: decreased knowledge of condition, decreased knowledge of use of DME, difficulty walking, increased edema, and pain.   ACTIVITY LIMITATIONS: standing and locomotion level  PARTICIPATION LIMITATIONS: community activity  PERSONAL FACTORS:  Fitness and Time since onset of injury/illness/exacerbation are also affecting patient's functional outcome.   REHAB POTENTIAL: Good  CLINICAL DECISION MAKING: Evolving/moderate complexity  EVALUATION COMPLEXITY: Moderate   GOALS: Goals reviewed with patient? Yes  SHORT TERM GOALS: Target date: 03/27/23  Pt will demonstrate a 3 cm decrease in edema at 30 cm superior to floor at lateral malleoli.  Baseline: Goal status: INITIAL   LONG  TERM GOALS: Target date: 04/24/23  Pt will be independent in self MLD for long term management of lymphedema.  Baseline:  Goal status: INITIAL  2.  Pt will receive trial of FlexiTouch compression pump for long term management of lymphedema.  Baseline:  Goal status: INITIAL  3.  Pt will obtain appropriate day and night time garments for long term management of lymphedema.  Baseline:  Goal status: INITIAL  4.  Pt will demonstrate maximal circumferential reduction to decrease risk of cellulitis and improve mobility.  Baseline:  Goal status: INITIAL    PLAN:  PT FREQUENCY: 3x/week  PT DURATION: 4 weeks  PLANNED INTERVENTIONS: Therapeutic exercises, Therapeutic activity, Patient/Family education, Self Care, Orthotic/Fit training, Manual lymph drainage, Compression bandaging, Taping, Vasopneumatic device, Manual therapy, and Re-evaluation  PLAN FOR NEXT SESSION: Able to try self bandaging yet? How was new compression foam at foot/ankle? Like to order velcro instead? measure at least on Mondays. send in pump demo and notes around 5/20 if interested, cont CDT to L LE, teach pt bandaging and MLD as able.    Hermenia Bers, PTA 03/20/2023, 12:25 PM

## 2023-03-21 NOTE — Telephone Encounter (Signed)
Ok to refill.  Savannah [name]. [name], MD 03/21/2023 12:44 PM

## 2023-03-21 NOTE — Telephone Encounter (Signed)
Please advise 

## 2023-03-22 ENCOUNTER — Encounter: Payer: Self-pay | Admitting: Rehabilitation

## 2023-03-22 ENCOUNTER — Telehealth: Payer: Self-pay | Admitting: Family Medicine

## 2023-03-22 ENCOUNTER — Ambulatory Visit: Payer: Medicare Other | Admitting: Rehabilitation

## 2023-03-22 ENCOUNTER — Other Ambulatory Visit: Payer: Self-pay | Admitting: *Deleted

## 2023-03-22 DIAGNOSIS — I890 Lymphedema, not elsewhere classified: Secondary | ICD-10-CM

## 2023-03-22 DIAGNOSIS — R262 Difficulty in walking, not elsewhere classified: Secondary | ICD-10-CM | POA: Diagnosis not present

## 2023-03-22 DIAGNOSIS — J309 Allergic rhinitis, unspecified: Secondary | ICD-10-CM

## 2023-03-22 DIAGNOSIS — M25662 Stiffness of left knee, not elsewhere classified: Secondary | ICD-10-CM | POA: Diagnosis not present

## 2023-03-22 MED ORDER — REPAGLINIDE 2 MG PO TABS: 2.0000 mg | ORAL_TABLET | Freq: Three times a day (TID) | ORAL | 3 refills | Status: DC

## 2023-03-22 NOTE — Telephone Encounter (Signed)
ENT referral placed.

## 2023-03-22 NOTE — Telephone Encounter (Signed)
Ok with me. Please place any necessary orders. 

## 2023-03-22 NOTE — Telephone Encounter (Signed)
Ok to placed referral? 

## 2023-03-22 NOTE — Therapy (Signed)
OUTPATIENT PHYSICAL THERAPY LOWER EXTREMITY LYMPHEDEMA TREATMENT  Patient Name: GAYLENE POSTAL MRN: [id number] DOB:March 05, 1956, 67 y.o., female Today's Date: 03/26/2023  END OF SESSION:      Past Medical History:  Diagnosis Date   ABDOMINAL PAIN, CHRONIC 10/20/2009   Acute cystitis 08/17/2009   ALLERGIC RHINITIS 11/21/2007   Allergy    ASYMPTOMATIC POSTMENOPAUSAL STATUS 09/29/2008   Bariatric surgery status 07/07/2010   Bariatric surgery status 07/07/2010   Qualifier: Diagnosis of  By: Everardo All MD, Sean A    Cancer [facility])    uterine   Cataract    Coronary artery disease    DEPRESSION 11/21/2007   DIABETES MELLITUS, TYPE II 07/08/2007   DYSPNEA 05/20/2009   Edema 05/20/2009   Eosinophilic esophagitis 02/13/2008   FEVER UNSPECIFIED 10/20/2009   FEVER, HX OF 03/09/2010   GERD (gastroesophageal reflux disease)    Headache(784.0) 02/06/2010   Hepatomegaly 01/06/2008   HYPERLIPIDEMIA 07/08/2007   HYPERTENSION 07/08/2007   LEUKOPENIA, MILD 09/29/2008   OBESITY 01/06/2008   OSTEOARTHRITIS 01/06/2008   Other chronic nonalcoholic liver disease 01/06/2008   PERIPHERAL NEUROPATHY 11/21/2007   Peripheral neuropathy    Postsurgical hypothyroidism 09/19/2010   SINUSITIS- ACUTE-NOS 11/21/2007   TB SKIN TEST, POSITIVE 02/06/2010   THYROID NODULE 03/09/2010   Past Surgical History:  Procedure Laterality Date   ABDOMINAL HYSTERECTOMY     BASAL CELL CARCINOMA EXCISION     CARDIAC CATHETERIZATION     CHOLECYSTECTOMY N/A 05/11/2021   Procedure: LAPAROSCOPIC CHOLECYSTECTOMY WITH POSSIBLE  INTRAOPERATIVE CHOLANGIOGRAM;  Surgeon: Luretha Murphy, MD;  Location: [facility];  Service: General;  Laterality: N/A;   COLONOSCOPY     CORONARY ATHERECTOMY N/A 05/03/2022   Procedure: CORONARY ATHERECTOMY;  Surgeon: Orbie Pyo, MD;  Location: [facility];  Service: Cardiovascular;  Laterality: N/A;   CORONARY IMAGING/OCT N/A 05/03/2022   Procedure: INTRAVASCULAR IMAGING/OCT;  Surgeon:  Orbie Pyo, MD;  Location: [facility];  Service: Cardiovascular;  Laterality: N/A;   CORONARY PRESSURE/FFR STUDY N/A 05/03/2022   Procedure: INTRAVASCULAR PRESSURE WIRE/FFR STUDY;  Surgeon: Orbie Pyo, MD;  Location: [facility];  Service: Cardiovascular;  Laterality: N/A;   CORONARY STENT INTERVENTION N/A 05/03/2022   Procedure: CORONARY STENT INTERVENTION;  Surgeon: Orbie Pyo, MD;  Location: [facility];  Service: Cardiovascular;  Laterality: N/A;  lad   EYE SURGERY Bilateral 08/2018, 09/2018   FOOT SURGERY Left    10/2019   KNEE ARTHROSCOPY Left    LEFT HEART CATH AND CORONARY ANGIOGRAPHY N/A 01/20/2019   Procedure: LEFT HEART CATH AND CORONARY ANGIOGRAPHY;  Surgeon: Lyn Records, MD;  Location: [facility];  Service: Cardiovascular;  Laterality: N/A;   LYMPH NODE DISSECTION     OOPHORECTOMY     Right total knee replacement     RIGHT/LEFT HEART CATH AND CORONARY ANGIOGRAPHY N/A 05/03/2022   Procedure: RIGHT/LEFT HEART CATH AND CORONARY ANGIOGRAPHY;  Surgeon: Orbie Pyo, MD;  Location: [facility];  Service: Cardiovascular;  Laterality: N/A;   ROUX-EN-Y GASTRIC BYPASS  2011   SKIN TAG REMOVAL     THYROIDECTOMY     TONSILLECTOMY AND ADENOIDECTOMY     TOOTH EXTRACTION  2023   UPPER GASTROINTESTINAL ENDOSCOPY     Patient Active Problem List   Diagnosis Date Noted   Bilateral carpal tunnel syndrome 03/13/2022   Leg edema 03/01/2022   Nondisplaced comminuted fracture of left patella, initial encounter for closed fracture 08/01/2021   Arthritis of  carpometacarpal (CMC) joint of right thumb 07/28/2021   Primary osteoarthritis of right distal radioulnar joint 07/28/2021   Arthritis of wrist, right, degenerative 07/28/2021   Osteopenia 01/14/2021   Sesamoiditis of left foot 11/09/2020   Posterior tibial tendinitis of left lower extremity 11/09/2020   Type 2 diabetes mellitus with diabetic peripheral angiopathy without gangrene,  with long-term current use of insulin (HCC) 10/21/2020   Iron deficiency anemia 03/14/2020   Lesion of vulva 09/02/2019   Menorrhagia 09/02/2019   Neuropathy 09/02/2019   Irregular bowel habits 06/25/2019   Coronary artery disease involving native coronary artery of native heart without angina pectoris    Temporomandibular joint disorder 12/05/2018   Fatigue 12/05/2018   Inverted nipple 12/05/2018   Senile purpura (HCC) 11/21/2018   GERD with esophagitis 11/21/2018   Pseudogout involving multiple joints 11/21/2018   Lipoma 07/22/2017   Lichen sclerosus 11/16/2016   H/O gastric bypass 11/16/2016   Depression 11/16/2016   Diabetic polyneuropathy associated with type 2 diabetes mellitus (HCC) 11/16/2016   Uterine cancer (HCC) s/p hysterectomy 1997 01/04/2012   Postsurgical hypothyroidism 09/19/2010   Diabetic neuropathy (HCC) 11/21/2007   Allergic rhinitis 11/21/2007   Dyslipidemia associated with type 2 diabetes mellitus (HCC) 07/08/2007    PCP: [name], MD  REFERRING PROVIDER: Ardith Dark, MD  REFERRING DIAG: R60.0 (ICD-10-CM) - Leg edema  THERAPY DIAG:  Lymphedema, not elsewhere classified  Difficulty in walking, not elsewhere classified  Rationale for Evaluation and Treatment: Rehabilitation  ONSET DATE: Prior to 1997  SUBJECTIVE:                                                                                                                                                                                           SUBJECTIVE STATEMENT:      EVAL: I have been having swelling since before 1997 that was mostly in my ankles. Then after my surgery for uterine cancer it worsened on the L leg. I take lasix but it has minimal effect. I have never had lymphedema therapy. I had prescription strength thigh highs but they did not help.   PERTINENT HISTORY:  Uterine cancer in 1997 with lymph node removal (pt reports the surgeon told her he got all the lymph nodes he could  find bilaterally), pt reports her vascular doctor told her the left leg edema was vascular, diabetic, has hx of cardiac stent   PAIN:  Are you having pain? No  PRECAUTIONS: Other: R Knee TKA  WEIGHT BEARING RESTRICTIONS: No  FALLS:  Has patient fallen in last 6 months? Yes. Number of falls 3 or 4 Falls due to feeling unsteady, has  neuropathy, BP has been an issue - low  PATIENT GOALS: to get the swelling down   OBJECTIVE:  SENSATION: pt reports neuropathy in bilateral feet  LYMPHEDEMA ASSESSMENTS:   LE LANDMARK RIGHT EVAL  At groin   30 cm proximal to suprapatella   20 cm proximal to suprapatella 47  10 cm proximal to suprapatella 40.3  At midpatella / popliteal crease 39  30 cm proximal to floor at lateral plantar foot 32.1  20 cm proximal to floor at lateral plantar foot 26.1  10 cm proximal to floor at lateral plantar foot 25.5  Circumference of ankle/heel   5 cm proximal to 1st MTP joint 22.7  Across MTP joint 24  Around proximal great toe 8  (Blank rows = not tested)  LE LANDMARK LEFT EVAL 03/18/23   At groin     30 cm proximal to suprapatella     20 cm proximal to suprapatella 46 47   10 cm proximal to suprapatella 41.2 43   At midpatella / popliteal crease 40.3 40.3   30 cm proximal to floor at lateral plantar foot 37.5 37.7   20 cm proximal to floor at lateral plantar foot 30.3 31.2   10 cm proximal to floor at lateral plantar foot 28.1 27.6   Circumference of ankle/heel     5 cm proximal to 1st MTP joint 25 24.7   Across MTP joint 23.4 24   Around proximal great toe 8.7 8.7   (Blank rows = not tested)   TODAY'S TREATMENT:                                                                                                                                         DATE:  03/22/23: Manual Therapy MLD to Lt LE as follows: Supine with bil LE's on bolster: Short neck, superficial and deep abdominals, Left axillary nodes, Lt inguino-axillary anastomosis, then Lt LE  working proximal to distal and then back proximally reversing all steps.  Focused on areas of increased swelling at the foot and ankle.  Compression Bandaging to Lt LE as follows: Cocoa butter to Lt LE including foot/ankle, TG soft x1 from foot to thigh, Elastomull to toes 1-4, Artiflex to foot/ankle, 1/2" gray compression foam to dorsal foot covering lateral malleolous, and 1/2" gray foam "wagon wheel" to medial malleolous fixated with 6 cm in Roman sandal, then 8 cm ASH, 2 - 10 cm from ankle to knee, first spiral, next herring bone, then 2 - 12 cm, first starting below knee with "X" at popliteal fossa and next start at knee and spiral to near groin.   03/20/23: Manual Therapy MLD to Lt LE as follows: Supine with bil LE's on bolster: Short neck, superficial and deep abdominals, Left axillary nodes, Lt inguino-axillary anastomosis, then Lt LE working proximal to distal and then back proximally reversing all steps.  Focused on areas of increased  swelling at the foot and ankle.  Compression Bandaging to Lt LE as follows: Cocoa butter to Lt LE including foot/ankle, TG soft x1 from foot to thigh, Elastomull to toes 1-4, Artiflex to foot/ankle, 1/2" gray compression foam to dorsal foot covering lateral malleolous, and 1/2" gray foam "wagon wheel" to medial malleolous fixated with 6 cm in Roman sandal, then 8 cm ASH, 2 - 10 cm from ankle to knee, first spiral, next herring bone, then 2 - 12 cm, first starting below knee with "X" at popliteal fossa and next start at knee and spiral to near groin.   03/18/23 Remeasured leg Started education on self bandaging. Gave link to MD anderson bandaging video. Verbal instruction, demo, and cueing to complete bandage.  Pt is able to complete the foot part in seated with the leg crossed over at the ankle and was able to reach toes 1-3.  Applied all bandages successfully with cueing and education.  Gave pt new set of bandages and new tg soft. Then PT reapplied bandages and donned  cast shoe.   Pt will apply bandages each morning after showering Pt may be interested in velcro  03/15/23 Supine legs on wedge: MLD: Left axillary nodes, Lt inguinal nodes, superficial and deep abdominals with breath, Lt inguinal-axillary anastomosis, then Lt LE working proximal to distal and then back proximally reversing all steps.  Focused on areas of increased swelling at the foot and ankle.   Large tg soft thigh, medium tg soft to lower leg, toes 1-4 with mollelast, rectangle rosidal soft and artiflex for foot and ankle shaping, then rosidal soft from ankle to knee and then knee to thigh, 1 8cm roman sandal, 1 10cm ASH, 1 10cm from ankle to knee, 1 12cm from below then knee to the hip and then another 12cm from below the knee to the hip.       PATIENT EDUCATION:  Education details: anatomy and physiology of the lymphatic system, need for day and night time compression garments, compression bandaging vs compression garments, venous insufficiency vs lymphedema Person educated: Patient Education method: Explanation Education comprehension: verbalized understanding  HOME EXERCISE PROGRAM: Get post op shoes and appropriate clothing to begin bandaging at next session  ASSESSMENT:  CLINICAL IMPRESSION: Pt came in today reporting the bandage did stay better with one stockinette on LE so she was able to leave them on. She did request bandage come closer to groin due to when it slides down her thigh it would pinch where it would settle. Pt also had visible increased swelling at foot/ankle per her report, since last session, so changed to 1/2" gray compression foam at foot/ankle to increase gradient of compression here. Also took last 12 cm bandage to near groin. Pt reports bandage comfortable at end of session. She was encouraged to try rewrapping her last 1 or 2 12 cm bandages if they slide down before last session. Pt verbalized understanding.   OBJECTIVE IMPAIRMENTS: decreased knowledge of  condition, decreased knowledge of use of DME, difficulty walking, increased edema, and pain.   ACTIVITY LIMITATIONS: standing and locomotion level  PARTICIPATION LIMITATIONS: community activity  PERSONAL FACTORS: Fitness and Time since onset of injury/illness/exacerbation are also affecting patient's functional outcome.   REHAB POTENTIAL: Good  CLINICAL DECISION MAKING: Evolving/moderate complexity  EVALUATION COMPLEXITY: Moderate   GOALS: Goals reviewed with patient? Yes  SHORT TERM GOALS: Target date: 03/27/23  Pt will demonstrate a 3 cm decrease in edema at 30 cm superior to floor at lateral malleoli.  Baseline: Goal status: INITIAL   LONG TERM GOALS: Target date: 04/24/23  Pt will be independent in self MLD for long term management of lymphedema.  Baseline:  Goal status: INITIAL  2.  Pt will receive trial of FlexiTouch compression pump for long term management of lymphedema.  Baseline:  Goal status: INITIAL  3.  Pt will obtain appropriate day and night time garments for long term management of lymphedema.  Baseline:  Goal status: INITIAL  4.  Pt will demonstrate maximal circumferential reduction to decrease risk of cellulitis and improve mobility.  Baseline:  Goal status: INITIAL    PLAN:  PT FREQUENCY: 3x/week  PT DURATION: 4 weeks  PLANNED INTERVENTIONS: Therapeutic exercises, Therapeutic activity, Patient/Family education, Self Care, Orthotic/Fit training, Manual lymph drainage, Compression bandaging, Taping, Vasopneumatic device, Manual therapy, and Re-evaluation  PLAN FOR NEXT SESSION: Able to try self bandaging yet? How was new compression foam at foot/ankle? Like to order velcro instead? measure at least on Mondays. send in pump demo and notes around 5/20 if interested, cont CDT to L LE, teach pt bandaging and MLD as able.    Idamae Lusher, PT 03/26/2023, 1:20 PM

## 2023-03-22 NOTE — Telephone Encounter (Signed)
Patient states that she would like an ENT referral for the issue she discussed with PCP during 4/12 OV. I offered pt an appointment but she declined. Please Advise.

## 2023-03-25 ENCOUNTER — Ambulatory Visit: Payer: Medicare Other | Admitting: Rehabilitation

## 2023-03-25 DIAGNOSIS — I890 Lymphedema, not elsewhere classified: Secondary | ICD-10-CM | POA: Diagnosis not present

## 2023-03-25 DIAGNOSIS — M25662 Stiffness of left knee, not elsewhere classified: Secondary | ICD-10-CM | POA: Diagnosis not present

## 2023-03-25 DIAGNOSIS — R262 Difficulty in walking, not elsewhere classified: Secondary | ICD-10-CM

## 2023-03-25 NOTE — Patient Instructions (Signed)
Deep Effective Breath   Standing, sitting, or laying down place both hands on the belly. Take a deep breath IN, expanding the belly; then breath 5 times  Copyright  [employer]. All rights reserved.  Inguinal Nodes to Axilla - Clear   On left side do 5-7 circles in the armpit  then from hip to armpit with pumps _5_ times, this is your pathway.    Pump up outer thigh of involved leg from knee to outer hip.   Then from inner to outer thigh, then do outer thigh again.   Next, interlace fingers behind knee IF ABLE and make in-place circles. Do _5_ times of each sequence.   LEG: Ankle to Hip Sweep   Hands on sides of ankle of involved leg, pump _5__ times up both sides of lower leg, then retrace steps up outer thigh to hip as before and back to pathway. Do _2-3_ times.  Copyright  [employer]HI. All rights reserved.  FOOT: Dorsum of Foot and Toes Massage   One hand on top of foot make _5_ stationary circles or pumps,   then either on top of toes or each individual toe do _5_ pumps.   Then retrace all steps pumping back up both sides of lower leg, outer thigh, and then pathway.   Finish with what you started with, _5_ circles at involved side arm pit.

## 2023-03-26 NOTE — Therapy (Signed)
OUTPATIENT PHYSICAL THERAPY LOWER EXTREMITY LYMPHEDEMA TREATMENT  Patient Name: Savannah [name] MRN: [id number] DOB:1956-10-10, 67 y.o., female Today's Date: 03/26/2023  END OF SESSION:  PT End of Session - 03/26/23 1308     Visit Number 8    Number of Visits 25    Date for PT Re-Evaluation 04/24/23    PT Start Time 1100    PT Stop Time 1155    PT Time Calculation (min) 55 min    Activity Tolerance Patient tolerated treatment well    Behavior During Therapy [facility] for tasks assessed/performed               Past Medical History:  Diagnosis Date   ABDOMINAL PAIN, CHRONIC 10/20/2009   Acute cystitis 08/17/2009   ALLERGIC RHINITIS 11/21/2007   Allergy    ASYMPTOMATIC POSTMENOPAUSAL STATUS 09/29/2008   Bariatric surgery status 07/07/2010   Bariatric surgery status 07/07/2010   Qualifier: Diagnosis of  By: Everardo All MD, Sean A    Cancer [facility])    uterine   Cataract    Coronary artery disease    DEPRESSION 11/21/2007   DIABETES MELLITUS, TYPE II 07/08/2007   DYSPNEA 05/20/2009   Edema 05/20/2009   Eosinophilic esophagitis 02/13/2008   FEVER UNSPECIFIED 10/20/2009   FEVER, HX OF 03/09/2010   GERD (gastroesophageal reflux disease)    Headache(784.0) 02/06/2010   Hepatomegaly 01/06/2008   HYPERLIPIDEMIA 07/08/2007   HYPERTENSION 07/08/2007   LEUKOPENIA, MILD 09/29/2008   OBESITY 01/06/2008   OSTEOARTHRITIS 01/06/2008   Other chronic nonalcoholic liver disease 01/06/2008   PERIPHERAL NEUROPATHY 11/21/2007   Peripheral neuropathy    Postsurgical hypothyroidism 09/19/2010   SINUSITIS- ACUTE-NOS 11/21/2007   TB SKIN TEST, POSITIVE 02/06/2010   THYROID NODULE 03/09/2010   Past Surgical History:  Procedure Laterality Date   ABDOMINAL HYSTERECTOMY     BASAL CELL CARCINOMA EXCISION     CARDIAC CATHETERIZATION     CHOLECYSTECTOMY N/A 05/11/2021   Procedure: LAPAROSCOPIC CHOLECYSTECTOMY WITH POSSIBLE  INTRAOPERATIVE CHOLANGIOGRAM;  Surgeon: Luretha Murphy, MD;  Location: [facility]  ORS;  Service: General;  Laterality: N/A;   COLONOSCOPY     CORONARY ATHERECTOMY N/A 05/03/2022   Procedure: CORONARY ATHERECTOMY;  Surgeon: Orbie Pyo, MD;  Location: [facility] INVASIVE CV LAB;  Service: Cardiovascular;  Laterality: N/A;   CORONARY IMAGING/OCT N/A 05/03/2022   Procedure: INTRAVASCULAR IMAGING/OCT;  Surgeon: Orbie Pyo, MD;  Location: [facility] INVASIVE CV LAB;  Service: Cardiovascular;  Laterality: N/A;   CORONARY PRESSURE/FFR STUDY N/A 05/03/2022   Procedure: INTRAVASCULAR PRESSURE WIRE/FFR STUDY;  Surgeon: Orbie Pyo, MD;  Location: [facility] IN[facility] CV [facility];  Service: Cardiovascular;  Laterality: N/A;   CORONARY STENT INTERVENTION N/A 05/03/2022   Procedure: CORONARY STENT INTERVENTION;  Surgeon: Orbie Pyo, MD;  Location: [facility];  Service: Cardiovascular;  Laterality: N/A;  lad   EYE SURGERY Bilateral 08/2018, 09/2018   FOOT SURGERY Left    10/2019   KNEE ARTHROSCOPY Left    LEFT HEART CATH AND CORONARY ANGIOGRAPHY N/A 01/20/2019   Procedure: LEFT HEART CATH AND CORONARY ANGIOGRAPHY;  Surgeon: Lyn Records, MD;  Location: [facility];  Service: Cardiovascular;  Laterality: N/A;   LYMPH NODE DISSECTION     OOPHORECTOMY     Right total knee replacement     RIGHT/LEFT HEART CATH AND CORONARY ANGIOGRAPHY N/A 05/03/2022   Procedure: RIGHT/LEFT HEART CATH AND CORONARY ANGIOGRAPHY;  Surgeon: Orbie Pyo, MD;  Location: [facility];  Service: Cardiovascular;  Laterality:  N/A;   ROUX-EN-Y GASTRIC BYPASS  2011   SKIN TAG REMOVAL     THYROIDECTOMY     TONSILLECTOMY AND ADENOIDECTOMY     TOOTH EXTRACTION  2023   UPPER GASTROINTESTINAL ENDOSCOPY     Patient Active Problem List   Diagnosis Date Noted   Bilateral carpal tunnel syndrome 03/13/2022   Leg edema 03/01/2022   Nondisplaced comminuted fracture of left patella, initial encounter for closed fracture 08/01/2021   Arthritis of carpometacarpal (CMC) joint of right thumb 07/28/2021    Primary osteoarthritis of right distal radioulnar joint 07/28/2021   Arthritis of wrist, right, degenerative 07/28/2021   Osteopenia 01/14/2021   Sesamoiditis of left foot 11/09/2020   Posterior tibial tendinitis of left lower extremity 11/09/2020   Type 2 diabetes mellitus with diabetic peripheral angiopathy without gangrene, with long-term current use of insulin (HCC) 10/21/2020   Iron deficiency anemia 03/14/2020   Lesion of vulva 09/02/2019   Menorrhagia 09/02/2019   Neuropathy 09/02/2019   Irregular bowel habits 06/25/2019   Coronary artery disease involving native coronary artery of native heart without angina pectoris    Temporomandibular joint disorder 12/05/2018   Fatigue 12/05/2018   Inverted nipple 12/05/2018   Senile purpura (HCC) 11/21/2018   GERD with esophagitis 11/21/2018   Pseudogout involving multiple joints 11/21/2018   Lipoma 07/22/2017   Lichen sclerosus 11/16/2016   H/O gastric bypass 11/16/2016   Depression 11/16/2016   Diabetic polyneuropathy associated with type 2 diabetes mellitus (HCC) 11/16/2016   Uterine cancer (HCC) s/p hysterectomy 1997 01/04/2012   Postsurgical hypothyroidism 09/19/2010   Diabetic neuropathy (HCC) 11/21/2007   Allergic rhinitis 11/21/2007   Dyslipidemia associated with type 2 diabetes mellitus (HCC) 07/08/2007    PCP: Savannah Doe, MD  REFERRING PROVIDER: Ardith Dark, MD  REFERRING DIAG: R60.0 (ICD-10-CM) - Leg edema  THERAPY DIAG:  Lymphedema, not elsewhere classified  Difficulty in walking, not elsewhere classified  Rationale for Evaluation and Treatment: Rehabilitation  ONSET DATE: Prior to 1997  SUBJECTIVE:                                                                                                                                                                                           SUBJECTIVE STATEMENT:    I wrapped it this weekend and I did okay.   EVAL: I have been having swelling since before 1997  that was mostly in my ankles. Then after my surgery for uterine cancer it worsened on the L leg. I take lasix but it has minimal effect. I have never had lymphedema therapy. I had prescription strength thigh highs but they did not help.  PERTINENT HISTORY:  Uterine cancer in 1997 with lymph node removal (pt reports the surgeon told her he got all the lymph nodes he could find bilaterally), pt reports her vascular doctor told her the left leg edema was vascular, diabetic, has hx of cardiac stent   PAIN:  Are you having pain? No  PRECAUTIONS: Other: R Knee TKA  WEIGHT BEARING RESTRICTIONS: No  FALLS:  Has patient fallen in last 6 months? Yes. Number of falls 3 or 4 Falls due to feeling unsteady, has neuropathy, BP has been an issue - low  PATIENT GOALS: to get the swelling down   OBJECTIVE:  SENSATION: pt reports neuropathy in bilateral feet  LYMPHEDEMA ASSESSMENTS:   LE LANDMARK RIGHT EVAL  At groin   30 cm proximal to suprapatella   20 cm proximal to suprapatella 47  10 cm proximal to suprapatella 40.3  At midpatella / popliteal crease 39  30 cm proximal to floor at lateral plantar foot 32.1  20 cm proximal to floor at lateral plantar foot 26.1  10 cm proximal to floor at lateral plantar foot 25.5  Circumference of ankle/heel   5 cm proximal to 1st MTP joint 22.7  Across MTP joint 24  Around proximal great toe 8  (Blank rows = not tested)  LE LANDMARK LEFT EVAL 03/18/23   At groin     30 cm proximal to suprapatella     20 cm proximal to suprapatella 46 47   10 cm proximal to suprapatella 41.2 43   At midpatella / popliteal crease 40.3 40.3   30 cm proximal to floor at lateral plantar foot 37.5 37.7   20 cm proximal to floor at lateral plantar foot 30.3 31.2   10 cm proximal to floor at lateral plantar foot 28.1 27.6   Circumference of ankle/heel     5 cm proximal to 1st MTP joint 25 24.7   Across MTP joint 23.4 24   Around proximal great toe 8.7 8.7   (Blank rows  = not tested)   TODAY'S TREATMENT:                                                                                                                                         DATE:  03/26/23: Manual Therapy Taught pt self MLD in seated with cueing and instruction for all steps. Then  MLD to Lt LE as follows: Supine with bil LE's on bolster: Short neck, superficial and deep abdominals, Left axillary nodes, Lt inguino-axillary anastomosis, then Lt LE working proximal to distal and then back proximally reversing all steps.  Focused on areas of increased swelling at the foot and ankle.  Compression Bandaging to Lt LE as follows: (trial of knee high only) Cocoa butter to Lt LE including foot/ankle, TG soft x1 from foot to knee, Elastomull to toes 1-4, Artiflex to foot/ankle, 1/2" gray compression foam to dorsal foot  covering lateral malleolous, and 1/2" gray foam "wagon wheel" to medial malleolous fixated with 6 cm in Roman sandal, then 8 cm ASH, 2 - 10 cm from ankle to knee,  Pt knows to remove if thigh gets more swollen and to incorporate some MLD proximally to help.   03/22/23: Manual Therapy MLD to Lt LE as follows: Supine with bil LE's on bolster: Short neck, superficial and deep abdominals, Left axillary nodes, Lt inguino-axillary anastomosis, then Lt LE working proximal to distal and then back proximally reversing all steps.  Focused on areas of increased swelling at the foot and ankle.  Compression Bandaging to Lt LE as follows: Cocoa butter to Lt LE including foot/ankle, TG soft x1 from foot to thigh, Elastomull to toes 1-4, Artiflex to foot/ankle, 1/2" gray compression foam to dorsal foot covering lateral malleolous, and 1/2" gray foam "wagon wheel" to medial malleolous fixated with 6 cm in Roman sandal, then 8 cm ASH, 2 - 10 cm from ankle to knee, first spiral, next herring bone, then 2 - 12 cm, first starting below knee with "X" at popliteal fossa and next start at knee and spiral to near groin.    03/20/23: Manual Therapy MLD to Lt LE as follows: Supine with bil LE's on bolster: Short neck, superficial and deep abdominals, Left axillary nodes, Lt inguino-axillary anastomosis, then Lt LE working proximal to distal and then back proximally reversing all steps.  Focused on areas of increased swelling at the foot and ankle.  Compression Bandaging to Lt LE as follows: Cocoa butter to Lt LE including foot/ankle, TG soft x1 from foot to thigh, Elastomull to toes 1-4, Artiflex to foot/ankle, 1/2" gray compression foam to dorsal foot covering lateral malleolous, and 1/2" gray foam "wagon wheel" to medial malleolous fixated with 6 cm in Roman sandal, then 8 cm ASH, 2 - 10 cm from ankle to knee, first spiral, next herring bone, then 2 - 12 cm, first starting below knee with "X" at popliteal fossa and next start at knee and spiral to near groin.   03/18/23 Remeasured leg Started education on self bandaging. Gave link to MD anderson bandaging video. Verbal instruction, demo, and cueing to complete bandage.  Pt is able to complete the foot part in seated with the leg crossed over at the ankle and was able to reach toes 1-3.  Applied all bandages successfully with cueing and education.  Gave pt new set of bandages and new tg soft. Then PT reapplied bandages and donned cast shoe.   Pt will apply bandages each morning after showering Pt may be interested in velcro  03/15/23 Supine legs on wedge: MLD: Left axillary nodes, Lt inguinal nodes, superficial and deep abdominals with breath, Lt inguinal-axillary anastomosis, then Lt LE working proximal to distal and then back proximally reversing all steps.  Focused on areas of increased swelling at the foot and ankle.   Large tg soft thigh, medium tg soft to lower leg, toes 1-4 with mollelast, rectangle rosidal soft and artiflex for foot and ankle shaping, then rosidal soft from ankle to knee and then knee to thigh, 1 8cm roman sandal, 1 10cm ASH, 1 10cm from ankle to  knee, 1 12cm from below then knee to the hip and then another 12cm from below the knee to the hip.       PATIENT EDUCATION:  Education details: anatomy and physiology of the lymphatic system, need for day and night time compression garments, compression bandaging vs compression garments, venous insufficiency  vs lymphedema Person educated: Patient Education method: Explanation Education comprehension: verbalized understanding  HOME EXERCISE PROGRAM: Get post op shoes and appropriate clothing to begin bandaging at next session  ASSESSMENT:  CLINICAL IMPRESSION: Pt did well with her first bandaging.  She would like to get measured for garments so we will set this up for Wednesday.  Trial of knee high only with pt to add thigh as needed.   OBJECTIVE IMPAIRMENTS: decreased knowledge of condition, decreased knowledge of use of DME, difficulty walking, increased edema, and pain.   ACTIVITY LIMITATIONS: standing and locomotion level  PARTICIPATION LIMITATIONS: community activity  PERSONAL FACTORS: Fitness and Time since onset of injury/illness/exacerbation are also affecting patient's functional outcome.   REHAB POTENTIAL: Good  CLINICAL DECISION MAKING: Evolving/moderate complexity  EVALUATION COMPLEXITY: Moderate   GOALS: Goals reviewed with patient? Yes  SHORT TERM GOALS: Target date: 03/27/23  Pt will demonstrate a 3 cm decrease in edema at 30 cm superior to floor at lateral malleoli.  Baseline: Goal status: INITIAL   LONG TERM GOALS: Target date: 04/24/23  Pt will be independent in self MLD for long term management of lymphedema.  Baseline:  Goal status: INITIAL  2.  Pt will receive trial of FlexiTouch compression pump for long term management of lymphedema.  Baseline:  Goal status: INITIAL  3.  Pt will obtain appropriate day and night time garments for long term management of lymphedema.  Baseline:  Goal status: INITIAL  4.  Pt will demonstrate maximal  circumferential reduction to decrease risk of cellulitis and improve mobility.  Baseline:  Goal status: INITIAL    PLAN:  PT FREQUENCY: 3x/week  PT DURATION: 4 weeks  PLANNED INTERVENTIONS: Therapeutic exercises, Therapeutic activity, Patient/Family education, Self Care, Orthotic/Fit training, Manual lymph drainage, Compression bandaging, Taping, Vasopneumatic device, Manual therapy, and Re-evaluation  PLAN FOR NEXT SESSION: measure at least on Mondays. send in pump demo and notes around 5/20 if interested, cont CDT to L LE, teach pt bandaging and MLD as able.    Idamae Lusher, PT 03/26/2023, 1:09 PM

## 2023-03-27 ENCOUNTER — Encounter: Payer: Self-pay | Admitting: Rehabilitation

## 2023-03-27 ENCOUNTER — Ambulatory Visit: Payer: Medicare Other | Admitting: Rehabilitation

## 2023-03-27 DIAGNOSIS — M25662 Stiffness of left knee, not elsewhere classified: Secondary | ICD-10-CM | POA: Diagnosis not present

## 2023-03-27 DIAGNOSIS — I890 Lymphedema, not elsewhere classified: Secondary | ICD-10-CM

## 2023-03-27 DIAGNOSIS — R262 Difficulty in walking, not elsewhere classified: Secondary | ICD-10-CM | POA: Diagnosis not present

## 2023-03-27 NOTE — Therapy (Signed)
OUTPATIENT PHYSICAL THERAPY LOWER EXTREMITY LYMPHEDEMA TREATMENT  Patient Name: Savannah [name] MRN: [id number] DOB:03-26-1956, 67 y.o., female Today's Date: 03/27/2023  END OF SESSION:  PT End of Session - 03/27/23 1108     Visit Number 9    Number of Visits 25    Date for PT Re-Evaluation 04/24/23    PT Start Time 1110    PT Stop Time 1206    PT Time Calculation (min) 56 min    Activity Tolerance Patient tolerated treatment well    Behavior During Therapy [facility] for tasks assessed/performed               Past Medical History:  Diagnosis Date   ABDOMINAL PAIN, CHRONIC 10/20/2009   Acute cystitis 08/17/2009   ALLERGIC RHINITIS 11/21/2007   Allergy    ASYMPTOMATIC POSTMENOPAUSAL STATUS 09/29/2008   Bariatric surgery status 07/07/2010   Bariatric surgery status 07/07/2010   Qualifier: Diagnosis of  By: Everardo All MD, Sean A    Cancer [facility])    uterine   Cataract    Coronary artery disease    DEPRESSION 11/21/2007   DIABETES MELLITUS, TYPE II 07/08/2007   DYSPNEA 05/20/2009   Edema 05/20/2009   Eosinophilic esophagitis 02/13/2008   FEVER UNSPECIFIED 10/20/2009   FEVER, HX OF 03/09/2010   GERD (gastroesophageal reflux disease)    Headache(784.0) 02/06/2010   Hepatomegaly 01/06/2008   HYPERLIPIDEMIA 07/08/2007   HYPERTENSION 07/08/2007   LEUKOPENIA, MILD 09/29/2008   OBESITY 01/06/2008   OSTEOARTHRITIS 01/06/2008   Other chronic nonalcoholic liver disease 01/06/2008   PERIPHERAL NEUROPATHY 11/21/2007   Peripheral neuropathy    Postsurgical hypothyroidism 09/19/2010   SINUSITIS- ACUTE-NOS 11/21/2007   TB SKIN TEST, POSITIVE 02/06/2010   THYROID NODULE 03/09/2010   Past Surgical History:  Procedure Laterality Date   ABDOMINAL HYSTERECTOMY     BASAL CELL CARCINOMA EXCISION     CARDIAC CATHETERIZATION     CHOLECYSTECTOMY N/A 05/11/2021   Procedure: LAPAROSCOPIC CHOLECYSTECTOMY WITH POSSIBLE  INTRAOPERATIVE CHOLANGIOGRAM;  Surgeon: Luretha Murphy, MD;  Location: [facility]  ORS;  Service: General;  Laterality: N/A;   COLONOSCOPY     CORONARY ATHERECTOMY N/A 05/03/2022   Procedure: CORONARY ATHERECTOMY;  Surgeon: Orbie Pyo, MD;  Location: [facility] INVASIVE CV LAB;  Service: Cardiovascular;  Laterality: N/A;   CORONARY IMAGING/OCT N/A 05/03/2022   Procedure: INTRAVASCULAR IMAGING/OCT;  Surgeon: Orbie Pyo, MD;  Location: [facility] INVASIVE CV LAB;  Service: Cardiovascular;  Laterality: N/A;   CORONARY PRESSURE/FFR STUDY N/A 05/03/2022   Procedure: INTRAVASCULAR PRESSURE WIRE/FFR STUDY;  Surgeon: Orbie Pyo, MD;  Location: [facility] INV[facility] CV LAB;  Service: Cardiovascular;  Laterality: N/A;   CORONARY STENT INTERVENTION N/A 05/03/2022   Procedure: CORONARY STENT INTERVENTION;  Surgeon: Orbie Pyo, MD;  Location: [facility];  Service: Cardiovascular;  Laterality: N/A;  lad   EYE SURGERY Bilateral 08/2018, 09/2018   FOOT SURGERY Left    10/2019   KNEE ARTHROSCOPY Left    LEFT HEART CATH AND CORONARY ANGIOGRAPHY N/A 01/20/2019   Procedure: LEFT HEART CATH AND CORONARY ANGIOGRAPHY;  Surgeon: Lyn Records, MD;  Location: [facility];  Service: Cardiovascular;  Laterality: N/A;   LYMPH NODE DISSECTION     OOPHORECTOMY     Right total knee replacement     RIGHT/LEFT HEART CATH AND CORONARY ANGIOGRAPHY N/A 05/03/2022   Procedure: RIGHT/LEFT HEART CATH AND CORONARY ANGIOGRAPHY;  Surgeon: Orbie Pyo, MD;  Location: [facility] INV[facility] CV [facility];  Service: Cardiovascular;  Laterality:  N/A;   ROUX-EN-Y GASTRIC BYPASS  2011   SKIN TAG REMOVAL     THYROIDECTOMY     TONSILLECTOMY AND ADENOIDECTOMY     TOOTH EXTRACTION  2023   UPPER GASTROINTESTINAL ENDOSCOPY     Patient Active Problem List   Diagnosis Date Noted   Bilateral carpal tunnel syndrome 03/13/2022   Leg edema 03/01/2022   Nondisplaced comminuted fracture of left patella, initial encounter for closed fracture 08/01/2021   Arthritis of carpometacarpal (CMC) joint of right thumb 07/28/2021    Primary osteoarthritis of right distal radioulnar joint 07/28/2021   Arthritis of wrist, right, degenerative 07/28/2021   Osteopenia 01/14/2021   Sesamoiditis of left foot 11/09/2020   Posterior tibial tendinitis of left lower extremity 11/09/2020   Type 2 diabetes mellitus with diabetic peripheral angiopathy without gangrene, with long-term current use of insulin (HCC) 10/21/2020   Iron deficiency anemia 03/14/2020   Lesion of vulva 09/02/2019   Menorrhagia 09/02/2019   Neuropathy 09/02/2019   Irregular bowel habits 06/25/2019   Coronary artery disease involving native coronary artery of native heart without angina pectoris    Temporomandibular joint disorder 12/05/2018   Fatigue 12/05/2018   Inverted nipple 12/05/2018   Senile purpura (HCC) 11/21/2018   GERD with esophagitis 11/21/2018   Pseudogout involving multiple joints 11/21/2018   Lipoma 07/22/2017   Lichen sclerosus 11/16/2016   H/O gastric bypass 11/16/2016   Depression 11/16/2016   Diabetic polyneuropathy associated with type 2 diabetes mellitus (HCC) 11/16/2016   Uterine cancer (HCC) s/p hysterectomy 1997 01/04/2012   Postsurgical hypothyroidism 09/19/2010   Diabetic neuropathy (HCC) 11/21/2007   Allergic rhinitis 11/21/2007   Dyslipidemia associated with type 2 diabetes mellitus (HCC) 07/08/2007    PCP: [name], MD  REFERRING PROVIDER: Ardith Dark, MD  REFERRING DIAG: R60.0 (ICD-10-CM) - Leg edema  THERAPY DIAG:  Lymphedema, not elsewhere classified  Difficulty in walking, not elsewhere classified  Rationale for Evaluation and Treatment: Rehabilitation  ONSET DATE: Prior to 1997  SUBJECTIVE:                                                                                                                                                                                           SUBJECTIVE STATEMENT:   I did a pretty good wrap.  I like it wrapped thigh high vs knee high it just feels more effective.     EVAL: I have been having swelling since before 1997 that was mostly in my ankles. Then after my surgery for uterine cancer it worsened on the L leg. I take lasix but it has minimal effect. I have never had lymphedema therapy.  I had prescription strength thigh highs but they did not help.   PERTINENT HISTORY:  Uterine cancer in 1997 with lymph node removal (pt reports the surgeon told her he got all the lymph nodes he could find bilaterally), pt reports her vascular doctor told her the left leg edema was vascular, diabetic, has hx of cardiac stent   PAIN:  Are you having pain? No  PRECAUTIONS: Other: R Knee TKA  WEIGHT BEARING RESTRICTIONS: No  FALLS:  Has patient fallen in last 6 months? Yes. Number of falls 3 or 4 Falls due to feeling unsteady, has neuropathy, BP has been an issue - low  PATIENT GOALS: to get the swelling down   OBJECTIVE:  SENSATION: pt reports neuropathy in bilateral feet  LYMPHEDEMA ASSESSMENTS:   LE LANDMARK RIGHT EVAL  At groin   30 cm proximal to suprapatella   20 cm proximal to suprapatella 47  10 cm proximal to suprapatella 40.3  At midpatella / popliteal crease 39  30 cm proximal to floor at lateral plantar foot 32.1  20 cm proximal to floor at lateral plantar foot 26.1  10 cm proximal to floor at lateral plantar foot 25.5  Circumference of ankle/heel   5 cm proximal to 1st MTP joint 22.7  Across MTP joint 24  Around proximal great toe 8  (Blank rows = not tested)  LE LANDMARK LEFT EVAL 03/18/23 03/27/23  At groin     30 cm proximal to suprapatella     20 cm proximal to suprapatella 46 47 44.5  10 cm proximal to suprapatella 41.2 43 41  At midpatella / popliteal crease 40.3 40.3 39.5  30 cm proximal to floor at lateral plantar foot 37.5 37.7 32  20 cm proximal to floor at lateral plantar foot 30.3 31.2 28.1  10 cm proximal to floor at lateral plantar foot 28.1 27.6 28  Circumference of ankle/heel     5 cm proximal to 1st MTP joint 25 24.7  23.5  Across MTP joint 23.4 24 23.3  Around proximal great toe 8.7 8.7 8.9  (Blank rows = not tested)   TODAY'S TREATMENT:                                                                                                                                         DATE:  03/27/23: Manual Therapy  MLD to Lt LE as follows: Supine with bil LE's on bolster: Short neck, superficial and deep abdominals, Left axillary nodes, Lt inguino-axillary anastomosis, then Lt LE working proximal to distal and then back proximally reversing all steps.  Focused on areas of increased swelling at the foot and ankle.  Was measured by cassidy for compression garments today. She will get a flat knit 20-30 class 2 thigh high medi cosy with sensitive or silicone free top and open toe due to silicone sensitivity and with knee flexure zone due to  sensitivity at the back of the knee when bandaging. We will start with 1 unit of day and night to make sure that it fits well and is comfortable for the patient.  She needs a custom garment because she has a sensitivity to silicone which is the only material available  in off the shelf.  She has stage 3 lymphedema that has reduced moderately from starting CDT and which fills up moderately quickly when not compressed.  She would also then benefit from a night garment to prevent refill and to decrease lower leg fibrosis which is present from the top of the foot to mid calf currently.  +2 pitting status. She has difficulty putting on socks with no compression so we will request a donning aid for the stocking as well.   Compression Bandaging to Lt LE as follows: (knee high only) Cocoa butter to Lt LE including foot/ankle, TG soft x1 from foot to knee, Elastomull to toes 1-4, Artiflex to foot/ankle, and rosidal soft to knee, then 6 cm in Roman sandal, then 8 cm ASH, 2 - 10 cm from ankle to knee,  Pt will remove for pedicure and then will rewrap her own leg later.     03/25/23: Manual Therapy Taught  pt self MLD in seated with cueing and instruction for all steps. Then  MLD to Lt LE as follows: Supine with bil LE's on bolster: Short neck, superficial and deep abdominals, Left axillary nodes, Lt inguino-axillary anastomosis, then Lt LE working proximal to distal and then back proximally reversing all steps.  Focused on areas of increased swelling at the foot and ankle.  Compression Bandaging to Lt LE as follows: (trial of knee high only) Cocoa butter to Lt LE including foot/ankle, TG soft x1 from foot to knee, Elastomull to toes 1-4, Artiflex to foot/ankle, 1/2" gray compression foam to dorsal foot covering lateral malleolous, and 1/2" gray foam "wagon wheel" to medial malleolous fixated with 6 cm in Roman sandal, then 8 cm ASH, 2 - 10 cm from ankle to knee,  Pt knows to remove if thigh gets more swollen and to incorporate some MLD proximally to help.   03/22/23: Manual Therapy MLD to Lt LE as follows: Supine with bil LE's on bolster: Short neck, superficial and deep abdominals, Left axillary nodes, Lt inguino-axillary anastomosis, then Lt LE working proximal to distal and then back proximally reversing all steps.  Focused on areas of increased swelling at the foot and ankle.  Compression Bandaging to Lt LE as follows: Cocoa butter to Lt LE including foot/ankle, TG soft x1 from foot to thigh, Elastomull to toes 1-4, Artiflex to foot/ankle, 1/2" gray compression foam to dorsal foot covering lateral malleolous, and 1/2" gray foam "wagon wheel" to medial malleolous fixated with 6 cm in Roman sandal, then 8 cm ASH, 2 - 10 cm from ankle to knee, first spiral, next herring bone, then 2 - 12 cm, first starting below knee with "X" at popliteal fossa and next start at knee and spiral to near groin.   03/20/23: Manual Therapy MLD to Lt LE as follows: Supine with bil LE's on bolster: Short neck, superficial and deep abdominals, Left axillary nodes, Lt inguino-axillary anastomosis, then Lt LE working proximal to  distal and then back proximally reversing all steps.  Focused on areas of increased swelling at the foot and ankle.  Compression Bandaging to Lt LE as follows: Cocoa butter to Lt LE including foot/ankle, TG soft x1 from foot to thigh, Elastomull to toes  1-4, Artiflex to foot/ankle, 1/2" gray compression foam to dorsal foot covering lateral malleolous, and 1/2" gray foam "wagon wheel" to medial malleolous fixated with 6 cm in Roman sandal, then 8 cm ASH, 2 - 10 cm from ankle to knee, first spiral, next herring bone, then 2 - 12 cm, first starting below knee with "X" at popliteal fossa and next start at knee and spiral to near groin.   03/18/23 Remeasured leg Started education on self bandaging. Gave link to MD anderson bandaging video. Verbal instruction, demo, and cueing to complete bandage.  Pt is able to complete the foot part in seated with the leg crossed over at the ankle and was able to reach toes 1-3.  Applied all bandages successfully with cueing and education.  Gave pt new set of bandages and new tg soft. Then PT reapplied bandages and donned cast shoe.   Pt will apply bandages each morning after showering Pt may be interested in velcro  03/15/23 Supine legs on wedge: MLD: Left axillary nodes, Lt inguinal nodes, superficial and deep abdominals with breath, Lt inguinal-axillary anastomosis, then Lt LE working proximal to distal and then back proximally reversing all steps.  Focused on areas of increased swelling at the foot and ankle.   Large tg soft thigh, medium tg soft to lower leg, toes 1-4 with mollelast, rectangle rosidal soft and artiflex for foot and ankle shaping, then rosidal soft from ankle to knee and then knee to thigh, 1 8cm roman sandal, 1 10cm ASH, 1 10cm from ankle to knee, 1 12cm from below then knee to the hip and then another 12cm from below the knee to the hip.       PATIENT EDUCATION:  Education details: anatomy and physiology of the lymphatic system, need for day and night  time compression garments, compression bandaging vs compression garments, venous insufficiency vs lymphedema Person educated: Patient Education method: Explanation Education comprehension: verbalized understanding  HOME EXERCISE PROGRAM: Get post op shoes and appropriate clothing to begin bandaging at next session  ASSESSMENT:  CLINICAL IMPRESSION: Pt is doing well with her own bandaging. She got measured for garments today during this visit.  See above for specifics.    OBJECTIVE IMPAIRMENTS: decreased knowledge of condition, decreased knowledge of use of DME, difficulty walking, increased edema, and pain.   ACTIVITY LIMITATIONS: standing and locomotion level  PARTICIPATION LIMITATIONS: community activity  PERSONAL FACTORS: Fitness and Time since onset of injury/illness/exacerbation are also affecting patient's functional outcome.   REHAB POTENTIAL: Good  CLINICAL DECISION MAKING: Evolving/moderate complexity  EVALUATION COMPLEXITY: Moderate   GOALS: Goals reviewed with patient? Yes  SHORT TERM GOALS: Target date: 03/27/23  Pt will demonstrate a 3 cm decrease in edema at 30 cm superior to floor at lateral malleoli.  Baseline: Goal status: MET   LONG TERM GOALS: Target date: 04/24/23  Pt will be independent in self MLD for long term management of lymphedema.  Baseline:  Goal status: MET  2.  Pt will receive trial of FlexiTouch compression pump for long term management of lymphedema.  Baseline:  Goal status: ONGOING  3.  Pt will obtain appropriate day and night time garments for long term management of lymphedema.  Baseline:  Goal status: ONGOING  4.  Pt will demonstrate maximal circumferential reduction to decrease risk of cellulitis and improve mobility.  Baseline:  Goal status: MOSTLY MET    PLAN:  PT FREQUENCY: 3x/week  PT DURATION: 4 weeks  PLANNED INTERVENTIONS: Therapeutic exercises, Therapeutic activity, Patient/Family education,  Self Care,  Orthotic/Fit training, Manual lymph drainage, Compression bandaging, Taping, Vasopneumatic device, Manual therapy, and Re-evaluation  PLAN FOR NEXT SESSION: measure at least on Mondays. send in pump demo and notes around 5/20 if interested, cont CDT to L LE, teach pt bandaging and MLD as able.    Idamae Lusher, PT 03/27/2023, 12:48 PM

## 2023-03-29 ENCOUNTER — Encounter: Payer: Self-pay | Admitting: Rehabilitation

## 2023-03-29 ENCOUNTER — Ambulatory Visit: Payer: Medicare Other | Admitting: Rehabilitation

## 2023-03-29 ENCOUNTER — Encounter: Payer: Self-pay | Admitting: Family Medicine

## 2023-03-29 DIAGNOSIS — R262 Difficulty in walking, not elsewhere classified: Secondary | ICD-10-CM | POA: Diagnosis not present

## 2023-03-29 DIAGNOSIS — I890 Lymphedema, not elsewhere classified: Secondary | ICD-10-CM

## 2023-03-29 DIAGNOSIS — M25662 Stiffness of left knee, not elsewhere classified: Secondary | ICD-10-CM | POA: Diagnosis not present

## 2023-03-29 NOTE — Therapy (Signed)
OUTPATIENT PHYSICAL THERAPY LOWER EXTREMITY LYMPHEDEMA TREATMENT  Patient Name: Savannah [name] MRN: [id number] DOB:16-Aug-1956, 67 y.o., female Today's Date: 03/29/2023  END OF SESSION:  PT End of Session - 03/29/23 1103     Visit Number 10    Number of Visits 25    Date for PT Re-Evaluation 04/24/23    PT Start Time 1105    PT Stop Time 1200    PT Time Calculation (min) 55 min    Activity Tolerance Patient tolerated treatment well    Behavior During Therapy WFL for tasks assessed/performed               Past Medical History:  Diagnosis Date   ABDOMINAL PAIN, CHRONIC 10/20/2009   Acute cystitis 08/17/2009   ALLERGIC RHINITIS 11/21/2007   Allergy    ASYMPTOMATIC POSTMENOPAUSAL STATUS 09/29/2008   Bariatric surgery status 07/07/2010   Bariatric surgery status 07/07/2010   Qualifier: Diagnosis of  By: Everardo All MD, Sean A    [facility])    uterine   Cataract    Coronary artery disease    DEPRESSION 11/21/2007   DIABETES MELLITUS, TYPE II 07/08/2007   DYSPNEA 05/20/2009   Edema 05/20/2009   Eosinophilic esophagitis 02/13/2008   FEVER UNSPECIFIED 10/20/2009   FEVER, HX OF 03/09/2010   GERD (gastroesophageal reflux disease)    Headache(784.0) 02/06/2010   Hepatomegaly 01/06/2008   HYPERLIPIDEMIA 07/08/2007   HYPERTENSION 07/08/2007   LEUKOPENIA, MILD 09/29/2008   OBESITY 01/06/2008   OSTEOARTHRITIS 01/06/2008   Other chronic nonalcoholic liver disease 01/06/2008   PERIPHERAL NEUROPATHY 11/21/2007   Peripheral neuropathy    Postsurgical hypothyroidism 09/19/2010   SINUSITIS- ACUTE-NOS 11/21/2007   TB SKIN TEST, POSITIVE 02/06/2010   THYROID NODULE 03/09/2010   Past Surgical History:  Procedure Laterality Date   ABDOMINAL HYSTERECTOMY     BASAL CELL CARCINOMA EXCISION     CARDIAC CATHETERIZATION     CHOLECYSTECTOMY N/A 05/11/2021   Procedure: LAPAROSCOPIC CHOLECYSTECTOMY WITH POSSIBLE  INTRAOPERATIVE CHOLANGIOGRAM;  Surgeon: Luretha Murphy, MD;  Location: [facility];  Service: General;  Laterality: N/A;   COLONOSCOPY     CORONARY ATHERECTOMY N/A 05/03/2022   Procedure: CORONARY ATHERECTOMY;  Surgeon: Orbie Pyo, MD;  Location: [facility];  Service: Cardiovascular;  Laterality: N/A;   CORONARY IMAGING/OCT N/A 05/03/2022   Procedure: INTRAVASCULAR IMAGING/OCT;  Surgeon: Orbie Pyo, MD;  Location: [facility];  Service: Cardiovascular;  Laterality: N/A;   CORONARY PRESSURE/FFR STUDY N/A 05/03/2022   Procedure: INTRAVASCULAR PRESSURE WIRE/FFR STUDY;  Surgeon: Orbie Pyo, MD;  Location: [facility];  Service: Cardiovascular;  Laterality: N/A;   CORONARY STENT INTERVENTION N/A 05/03/2022   Procedure: CORONARY STENT INTERVENTION;  Surgeon: Orbie Pyo, MD;  Location: [facility];  Service: Cardiovascular;  Laterality: N/A;  lad   EYE SURGERY Bilateral 08/2018, 09/2018   FOOT SURGERY Left    10/2019   KNEE ARTHROSCOPY Left    LEFT HEART CATH AND CORONARY ANGIOGRAPHY N/A 01/20/2019   Procedure: LEFT HEART CATH AND CORONARY ANGIOGRAPHY;  Surgeon: Lyn Records, MD;  Location: [facility];  Service: Cardiovascular;  Laterality: N/A;   LYMPH NODE DISSECTION     OOPHORECTOMY     Right total knee replacement     RIGHT/LEFT HEART CATH AND CORONARY ANGIOGRAPHY N/A 05/03/2022   Procedure: RIGHT/LEFT HEART CATH AND CORONARY ANGIOGRAPHY;  Surgeon: Orbie Pyo, MD;  Location: [facility];  Service: Cardiovascular;  Laterality:  N/A;   ROUX-EN-Y GASTRIC BYPASS  2011   SKIN TAG REMOVAL     THYROIDECTOMY     TONSILLECTOMY AND ADENOIDECTOMY     TOOTH EXTRACTION  2023   UPPER GASTROINTESTINAL ENDOSCOPY     Patient Active Problem List   Diagnosis Date Noted   Bilateral carpal tunnel syndrome 03/13/2022   Leg edema 03/01/2022   Nondisplaced comminuted fracture of left patella, initial encounter for closed fracture 08/01/2021   Arthritis of carpometacarpal (CMC) joint of right thumb 07/28/2021    Primary osteoarthritis of right distal radioulnar joint 07/28/2021   Arthritis of wrist, right, degenerative 07/28/2021   Osteopenia 01/14/2021   Sesamoiditis of left foot 11/09/2020   Posterior tibial tendinitis of left lower extremity 11/09/2020   Type 2 diabetes mellitus with diabetic peripheral angiopathy without gangrene, with long-term current use of insulin (HCC) 10/21/2020   Iron deficiency anemia 03/14/2020   Lesion of vulva 09/02/2019   Menorrhagia 09/02/2019   Neuropathy 09/02/2019   Irregular bowel habits 06/25/2019   Coronary artery disease involving native coronary artery of native heart without angina pectoris    Temporomandibular joint disorder 12/05/2018   Fatigue 12/05/2018   Inverted nipple 12/05/2018   Senile purpura (HCC) 11/21/2018   GERD with esophagitis 11/21/2018   Pseudogout involving multiple joints 11/21/2018   Lipoma 07/22/2017   Lichen sclerosus 11/16/2016   H/O gastric bypass 11/16/2016   Depression 11/16/2016   Diabetic polyneuropathy associated with type 2 diabetes mellitus (HCC) 11/16/2016   Uterine cancer (HCC) s/p hysterectomy 1997 01/04/2012   Postsurgical hypothyroidism 09/19/2010   Diabetic neuropathy (HCC) 11/21/2007   Allergic rhinitis 11/21/2007   Dyslipidemia associated with type 2 diabetes mellitus (HCC) 07/08/2007    PCP: [name], MD  REFERRING PROVIDER: Ardith Dark, MD  REFERRING DIAG: R60.0 (ICD-10-CM) - Leg edema  THERAPY DIAG:  Lymphedema, not elsewhere classified  Difficulty in walking, not elsewhere classified  Rationale for Evaluation and Treatment: Rehabilitation  ONSET DATE: Prior to 1997  SUBJECTIVE:                                                                                                                                                                                           SUBJECTIVE STATEMENT:    I had it off for around 18 hours and the good news was that it did not get bigger.    EVAL: I have  been having swelling since before 1997 that was mostly in my ankles. Then after my surgery for uterine cancer it worsened on the L leg. I take lasix but it has minimal effect. I have never had lymphedema therapy. I  had prescription strength thigh highs but they did not help.   PERTINENT HISTORY:  Uterine cancer in 1997 with lymph node removal (pt reports the surgeon told her he got all the lymph nodes he could find bilaterally), pt reports her vascular doctor told her the left leg edema was vascular, diabetic, has hx of cardiac stent   PAIN:  Are you having pain? No  PRECAUTIONS: Other: R Knee TKA  WEIGHT BEARING RESTRICTIONS: No  FALLS:  Has patient fallen in last 6 months? Yes. Number of falls 3 or 4 Falls due to feeling unsteady, has neuropathy, BP has been an issue - low  PATIENT GOALS: to get the swelling down   OBJECTIVE:  SENSATION: pt reports neuropathy in bilateral feet  LYMPHEDEMA ASSESSMENTS:   LE LANDMARK RIGHT EVAL  At groin   30 cm proximal to suprapatella   20 cm proximal to suprapatella 47  10 cm proximal to suprapatella 40.3  At midpatella / popliteal crease 39  30 cm proximal to floor at lateral plantar foot 32.1  20 cm proximal to floor at lateral plantar foot 26.1  10 cm proximal to floor at lateral plantar foot 25.5  Circumference of ankle/heel   5 cm proximal to 1st MTP joint 22.7  Across MTP joint 24  Around proximal great toe 8  (Blank rows = not tested)  LE LANDMARK LEFT EVAL 03/18/23 03/27/23  At groin     30 cm proximal to suprapatella     20 cm proximal to suprapatella 46 47 44.5  10 cm proximal to suprapatella 41.2 43 41  At midpatella / popliteal crease 40.3 40.3 39.5  30 cm proximal to floor at lateral plantar foot 37.5 37.7 32  20 cm proximal to floor at lateral plantar foot 30.3 31.2 28.1  10 cm proximal to floor at lateral plantar foot 28.1 27.6 28  Circumference of ankle/heel     5 cm proximal to 1st MTP joint 25 24.7 23.5  Across  MTP joint 23.4 24 23.3  Around proximal great toe 8.7 8.7 8.9  (Blank rows = not tested)   TODAY'S TREATMENT:                                                                                                                                         DATE:  03/29/23: Manual Therapy  MLD to Lt LE as follows: Supine with bil LE's on bolster: Short neck, superficial and deep abdominals, Left axillary nodes, Lt inguino-axillary anastomosis, then Lt LE working proximal to distal and then back proximally reversing all steps.  Focused on areas of increased swelling at the foot and ankle.  Compression Bandaging to Lt LE as follows: (knee high only) Cocoa butter to Lt LE including foot/ankle, TG soft x1 from foot to knee, Elastomull to toes 1-4, Artiflex to foot/ankle, and rosidal soft to knee, then 6 cm in Roman sandal,  then 8 cm ASH, 2 - 10 cm from ankle to knee,  Pt will remove for pedicure and then will rewrap her own leg later.     03/27/23: Manual Therapy  MLD to Lt LE as follows: Supine with bil LE's on bolster: Short neck, superficial and deep abdominals, Left axillary nodes, Lt inguino-axillary anastomosis, then Lt LE working proximal to distal and then back proximally reversing all steps.  Focused on areas of increased swelling at the foot and ankle.  Was measured by [facility] for compression garments today. She will get a flat knit 20-30 class 2 thigh high medi cosy with sensitive or silicone free top and open toe due to silicone sensitivity and with knee flexure zone due to sensitivity at the back of the knee when bandaging. We will start with 1 unit of day and night to make sure that it fits well and is comfortable for the patient.  She needs a custom garment because she has a sensitivity to silicone which is the only material available  in off the shelf.  She has stage 3 lymphedema that has reduced moderately from starting CDT and which fills up moderately quickly when not compressed.  She would also  then benefit from a night garment to prevent refill and to decrease lower leg fibrosis which is present from the top of the foot to mid calf currently.  +2 pitting status. She has difficulty putting on socks with no compression so we will request a donning aid for the stocking as well.   Compression Bandaging to Lt LE as follows: (knee high only) Cocoa butter to Lt LE including foot/ankle, TG soft x1 from foot to knee, Elastomull to toes 1-4, Artiflex to foot/ankle, and rosidal soft to knee, then 6 cm in Roman sandal, then 8 cm ASH, 2 - 10 cm from ankle to knee,  Pt will remove for pedicure and then will rewrap her own leg later.     03/25/23: Manual Therapy Taught pt self MLD in seated with cueing and instruction for all steps. Then  MLD to Lt LE as follows: Supine with bil LE's on bolster: Short neck, superficial and deep abdominals, Left axillary nodes, Lt inguino-axillary anastomosis, then Lt LE working proximal to distal and then back proximally reversing all steps.  Focused on areas of increased swelling at the foot and ankle.  Compression Bandaging to Lt LE as follows: (trial of knee high only) Cocoa butter to Lt LE including foot/ankle, TG soft x1 from foot to knee, Elastomull to toes 1-4, Artiflex to foot/ankle, 1/2" gray compression foam to dorsal foot covering lateral malleolous, and 1/2" gray foam "wagon wheel" to medial malleolous fixated with 6 cm in Roman sandal, then 8 cm ASH, 2 - 10 cm from ankle to knee,  Pt knows to remove if thigh gets more swollen and to incorporate some MLD proximally to help.   03/22/23: Manual Therapy MLD to Lt LE as follows: Supine with bil LE's on bolster: Short neck, superficial and deep abdominals, Left axillary nodes, Lt inguino-axillary anastomosis, then Lt LE working proximal to distal and then back proximally reversing all steps.  Focused on areas of increased swelling at the foot and ankle.  Compression Bandaging to Lt LE as follows: Cocoa butter to Lt LE  including foot/ankle, TG soft x1 from foot to thigh, Elastomull to toes 1-4, Artiflex to foot/ankle, 1/2" gray compression foam to dorsal foot covering lateral malleolous, and 1/2" gray foam "wagon wheel" to medial malleolous fixated with  6 cm in Roman sandal, then 8 cm ASH, 2 - 10 cm from ankle to knee, first spiral, next herring bone, then 2 - 12 cm, first starting below knee with "X" at popliteal fossa and next start at knee and spiral to near groin.   03/20/23: Manual Therapy MLD to Lt LE as follows: Supine with bil LE's on bolster: Short neck, superficial and deep abdominals, Left axillary nodes, Lt inguino-axillary anastomosis, then Lt LE working proximal to distal and then back proximally reversing all steps.  Focused on areas of increased swelling at the foot and ankle.  Compression Bandaging to Lt LE as follows: Cocoa butter to Lt LE including foot/ankle, TG soft x1 from foot to thigh, Elastomull to toes 1-4, Artiflex to foot/ankle, 1/2" gray compression foam to dorsal foot covering lateral malleolous, and 1/2" gray foam "wagon wheel" to medial malleolous fixated with 6 cm in Roman sandal, then 8 cm ASH, 2 - 10 cm from ankle to knee, first spiral, next herring bone, then 2 - 12 cm, first starting below knee with "X" at popliteal fossa and next start at knee and spiral to near groin.   03/18/23 Remeasured leg Started education on self bandaging. Gave link to MD anderson bandaging video. Verbal instruction, demo, and cueing to complete bandage.  Pt is able to complete the foot part in seated with the leg crossed over at the ankle and was able to reach toes 1-3.  Applied all bandages successfully with cueing and education.  Gave pt new set of bandages and new tg soft. Then PT reapplied bandages and donned cast shoe.   Pt will apply bandages each morning after showering Pt may be interested in velcro  03/15/23 Supine legs on wedge: MLD: Left axillary nodes, Lt inguinal nodes, superficial and deep  abdominals with breath, Lt inguinal-axillary anastomosis, then Lt LE working proximal to distal and then back proximally reversing all steps.  Focused on areas of increased swelling at the foot and ankle.   Large tg soft thigh, medium tg soft to lower leg, toes 1-4 with mollelast, rectangle rosidal soft and artiflex for foot and ankle shaping, then rosidal soft from ankle to knee and then knee to thigh, 1 8cm roman sandal, 1 10cm ASH, 1 10cm from ankle to knee, 1 12cm from below then knee to the hip and then another 12cm from below the knee to the hip.       PATIENT EDUCATION:  Education details: anatomy and physiology of the lymphatic system, need for day and night time compression garments, compression bandaging vs compression garments, venous insufficiency vs lymphedema Person educated: Patient Education method: Explanation Education comprehension: verbalized understanding  HOME EXERCISE PROGRAM: Get post op shoes and appropriate clothing to begin bandaging at next session  ASSESSMENT:  CLINICAL IMPRESSION: Pt is doing well with her own bandaging. We decreased to 1x per week x 4 more weeks to monitor status. Will submit pump stuff next time.    OBJECTIVE IMPAIRMENTS: decreased knowledge of condition, decreased knowledge of use of DME, difficulty walking, increased edema, and pain.   ACTIVITY LIMITATIONS: standing and locomotion level  PARTICIPATION LIMITATIONS: community activity  PERSONAL FACTORS: Fitness and Time since onset of injury/illness/exacerbation are also affecting patient's functional outcome.   REHAB POTENTIAL: Good  CLINICAL DECISION MAKING: Evolving/moderate complexity  EVALUATION COMPLEXITY: Moderate   GOALS: Goals reviewed with patient? Yes  SHORT TERM GOALS: Target date: 03/27/23  Pt will demonstrate a 3 cm decrease in edema at 30 cm superior  to floor at lateral malleoli.  Baseline: Goal status: MET   LONG TERM GOALS: Target date: 04/24/23  Pt will be  independent in self MLD for long term management of lymphedema.  Baseline:  Goal status: MET  2.  Pt will receive trial of FlexiTouch compression pump for long term management of lymphedema.  Baseline:  Goal status: ONGOING  3.  Pt will obtain appropriate day and night time garments for long term management of lymphedema.  Baseline:  Goal status: ONGOING  4.  Pt will demonstrate maximal circumferential reduction to decrease risk of cellulitis and improve mobility.  Baseline:  Goal status: MOSTLY MET    PLAN:  PT FREQUENCY: 3x/week  PT DURATION: 4 weeks  PLANNED INTERVENTIONS: Therapeutic exercises, Therapeutic activity, Patient/Family education, Self Care, Orthotic/Fit training, Manual lymph drainage, Compression bandaging, Taping, Vasopneumatic device, Manual therapy, and Re-evaluation  PLAN FOR NEXT SESSION: measure at least on Mondays. send in pump demo and notes around 5/20 if interested, cont CDT to L LE, teach pt bandaging and MLD as able.    Idamae Lusher, PT 03/29/2023, 12:01 PM

## 2023-04-01 ENCOUNTER — Ambulatory Visit: Payer: Medicare Other | Admitting: Rehabilitation

## 2023-04-01 NOTE — Telephone Encounter (Signed)
Please advise 

## 2023-04-01 NOTE — Telephone Encounter (Signed)
Ok to sign off on this but I have not seen any forms.  Katina Degree. [name], MD 04/01/2023 1:42 PM

## 2023-04-02 NOTE — Telephone Encounter (Signed)
Form placed in PCP office to be reviewed  

## 2023-04-03 ENCOUNTER — Ambulatory Visit: Payer: Medicare Other | Admitting: Rehabilitation

## 2023-04-03 ENCOUNTER — Ambulatory Visit (INDEPENDENT_AMBULATORY_CARE_PROVIDER_SITE_OTHER): Payer: Medicare Other | Admitting: Family Medicine

## 2023-04-03 ENCOUNTER — Encounter: Payer: Self-pay | Admitting: Family Medicine

## 2023-04-03 ENCOUNTER — Other Ambulatory Visit: Payer: Self-pay | Admitting: Family Medicine

## 2023-04-03 VITALS — BP 130/76 | HR 70 | Temp 97.6°F | Ht 71.0 in

## 2023-04-03 DIAGNOSIS — D0362 Melanoma in situ of left upper limb, including shoulder: Secondary | ICD-10-CM

## 2023-04-03 DIAGNOSIS — D72819 Decreased white blood cell count, unspecified: Secondary | ICD-10-CM | POA: Diagnosis not present

## 2023-04-03 DIAGNOSIS — E039 Hypothyroidism, unspecified: Secondary | ICD-10-CM

## 2023-04-03 DIAGNOSIS — R7989 Other specified abnormal findings of blood chemistry: Secondary | ICD-10-CM

## 2023-04-03 DIAGNOSIS — R300 Dysuria: Secondary | ICD-10-CM

## 2023-04-03 DIAGNOSIS — N3001 Acute cystitis with hematuria: Secondary | ICD-10-CM | POA: Diagnosis not present

## 2023-04-03 HISTORY — DX: Melanoma in situ of left upper limb, including shoulder: D03.62

## 2023-04-03 LAB — POC URINALSYSI DIPSTICK (AUTOMATED)
Bilirubin, UA: POSITIVE
Blood, UA: POSITIVE
Glucose, UA: POSITIVE — AB
Ketones, UA: NEGATIVE
Nitrite, UA: POSITIVE
Protein, UA: POSITIVE — AB
Spec Grav, UA: 1.015 (ref 1.010–1.025)
Urobilinogen, UA: 4 E.U./dL — AB
pH, UA: 5 (ref 5.0–8.0)

## 2023-04-03 MED ORDER — CEPHALEXIN 500 MG PO CAPS: 500.0000 mg | ORAL_CAPSULE | Freq: Two times a day (BID) | ORAL | 0 refills | Status: DC

## 2023-04-03 NOTE — Progress Notes (Signed)
Subjective:  Savannah [name] is a 67 y.o. female who complains of possible urinary tract infection.  She has had symptoms for 2 days.  Symptoms include  urinary frequency, urgency and dysuria . Patient denies  fever, chills, abdominal pain, back pain, N/V/D .  Last UTI was January.   Using Pyridium for current symptoms.     No other aggravating or relieving factors.  No other c/o.  Past Medical History:  Diagnosis Date   ABDOMINAL PAIN, CHRONIC 10/20/2009   Acute cystitis 08/17/2009   ALLERGIC RHINITIS 11/21/2007   Allergy    ASYMPTOMATIC POSTMENOPAUSAL STATUS 09/29/2008   Bariatric surgery status 07/07/2010   Bariatric surgery status 07/07/2010   Qualifier: Diagnosis of  By: Everardo All MD, Sean A    Cancer [facility])    uterine   Cataract    Coronary artery disease    DEPRESSION 11/21/2007   DIABETES MELLITUS, TYPE II 07/08/2007   DYSPNEA 05/20/2009   Edema 05/20/2009   Eosinophilic esophagitis 02/13/2008   FEVER UNSPECIFIED 10/20/2009   FEVER, HX OF 03/09/2010   GERD (gastroesophageal reflux disease)    Headache(784.0) 02/06/2010   Hepatomegaly 01/06/2008   HYPERLIPIDEMIA 07/08/2007   HYPERTENSION 07/08/2007   LEUKOPENIA, MILD 09/29/2008   Melanoma in situ of left upper arm (HCC) 04/03/2023   OBESITY 01/06/2008   OSTEOARTHRITIS 01/06/2008   Other chronic nonalcoholic liver disease 01/06/2008   PERIPHERAL NEUROPATHY 11/21/2007   Peripheral neuropathy    Postsurgical hypothyroidism 09/19/2010   SINUSITIS- ACUTE-NOS 11/21/2007   TB SKIN TEST, POSITIVE 02/06/2010   THYROID NODULE 03/09/2010    ROS as in subjective  Reviewed allergies, medications, past medical, surgical, and social history.    Objective: Vitals:   04/03/23 1556  BP: 130/76  Pulse: 70  Temp: 97.6 F (36.4 C)  SpO2: 98%    General appearance: alert, no distress, WD/WN, female Abdomen: +bs, soft, non tender, non distended Back: no CVA tenderness      Laboratory:  Urine dipstick: +nit, leuks, blood,  gluc    Assessment: Acute cystitis with hematuria - Plan: Urine Culture, Urine Culture  Dysuria - Plan: POCT Urinalysis Dipstick (Automated)  Hypothyroidism, unspecified type - Plan: TSH  Elevated serum creatinine - Plan: Comp Met (CMET)  Leukopenia, unspecified type - Plan: CBC with Differential/Platelet   Plan: Discussed symptoms, diagnosis, possible complications, and usual course of illness. Keflex prescribed.  Advised increased water intake, can use OTC Tylenol for pain.    Advised she may take Pyridium today and then stop.   Urine culture sent    Call or return if worse or not improving.    She is getting labs done today ordered by PCP.

## 2023-04-03 NOTE — Patient Instructions (Signed)
Take the antibiotic as prescribed. Drink plenty of water.   Follow up if you develop any worsening symptoms.   We will be in touch with your urine culture results.

## 2023-04-04 LAB — CBC WITH DIFFERENTIAL/PLATELET
Basophils Absolute: 0 10*3/uL (ref 0.0–0.1)
Basophils Relative: 0.9 % (ref 0.0–3.0)
Eosinophils Absolute: 0.4 10*3/uL (ref 0.0–0.7)
Eosinophils Relative: 7.1 % — ABNORMAL HIGH (ref 0.0–5.0)
HCT: 37.9 % (ref 36.0–46.0)
Hemoglobin: 12.1 g/dL (ref 12.0–15.0)
Lymphocytes Relative: 26.4 % (ref 12.0–46.0)
Lymphs Abs: 1.4 10*3/uL (ref 0.7–4.0)
MCHC: 31.9 g/dL (ref 30.0–36.0)
MCV: 99 fl (ref 78.0–100.0)
Monocytes Absolute: 0.4 10*3/uL (ref 0.1–1.0)
Monocytes Relative: 7.8 % (ref 3.0–12.0)
Neutro Abs: 3 10*3/uL (ref 1.4–7.7)
Neutrophils Relative %: 57.8 % (ref 43.0–77.0)
Platelets: 215 10*3/uL (ref 150.0–400.0)
RBC: 3.83 Mil/uL — ABNORMAL LOW (ref 3.87–5.11)
RDW: 14.6 % (ref 11.5–15.5)
WBC: 5.2 10*3/uL (ref 4.0–10.5)

## 2023-04-04 LAB — TSH: TSH: 2.19 u[IU]/mL (ref 0.35–5.50)

## 2023-04-04 LAB — COMPREHENSIVE METABOLIC PANEL
ALT: 51 U/L — ABNORMAL HIGH (ref 0–35)
AST: 35 U/L (ref 0–37)
Albumin: 4.1 g/dL (ref 3.5–5.2)
Alkaline Phosphatase: 105 U/L (ref 39–117)
BUN: 27 mg/dL — ABNORMAL HIGH (ref 6–23)
CO2: 23 mEq/L (ref 19–32)
Calcium: 9.1 mg/dL (ref 8.4–10.5)
Chloride: 107 mEq/L (ref 96–112)
Creatinine, Ser: 1.78 mg/dL — ABNORMAL HIGH (ref 0.40–1.20)
GFR: 28.9 mL/min — ABNORMAL LOW (ref >60.00)
Glucose, Bld: 171 mg/dL — ABNORMAL HIGH (ref 70–99)
Potassium: 5 mEq/L (ref 3.5–5.1)
Sodium: 138 mEq/L (ref 135–145)
Total Bilirubin: 0.4 mg/dL (ref 0.2–1.2)
Total Protein: 6.7 g/dL (ref 6.0–8.3)

## 2023-04-04 NOTE — Telephone Encounter (Signed)
Form faxed to [phone] Form placed to be scan in pt chart

## 2023-04-05 ENCOUNTER — Ambulatory Visit: Payer: Medicare Other | Admitting: Rehabilitation

## 2023-04-05 ENCOUNTER — Other Ambulatory Visit: Payer: Self-pay | Admitting: Family Medicine

## 2023-04-05 DIAGNOSIS — I890 Lymphedema, not elsewhere classified: Secondary | ICD-10-CM

## 2023-04-05 DIAGNOSIS — R262 Difficulty in walking, not elsewhere classified: Secondary | ICD-10-CM

## 2023-04-05 DIAGNOSIS — M25662 Stiffness of left knee, not elsewhere classified: Secondary | ICD-10-CM | POA: Diagnosis not present

## 2023-04-05 DIAGNOSIS — N3001 Acute cystitis with hematuria: Secondary | ICD-10-CM

## 2023-04-05 LAB — URINE CULTURE

## 2023-04-05 MED ORDER — SULFAMETHOXAZOLE-TRIMETHOPRIM 400-80 MG PO TABS
1.0000 | ORAL_TABLET | Freq: Two times a day (BID) | ORAL | 0 refills | Status: DC
Start: 2023-04-05 — End: 2023-08-22

## 2023-04-05 NOTE — Progress Notes (Signed)
Please call her and let her know that her urine culture shows that the bacteria is resistant to her current antibiotic.  I sent in a new antibiotic called Bactrim.  Please ask her to stop metformin and Prandin (her diabetes medications) while she is taking this antibiotic. Keep a close eye on her blood sugars since they will increase without these medications but should improve once she restarts them.

## 2023-04-05 NOTE — Progress Notes (Signed)
Her kidney numbers are still elevated.  This may be age-related changes however we need to make sure there is nothing else that is going on.  Please place referral to nephrology.   Blood counts are back to normal-do not need to do any other testing for this.    Thyroid level is also back to normal.  She should continue current dose of levothyroxine and we can recheck at next office visit.

## 2023-04-05 NOTE — Therapy (Signed)
OUTPATIENT PHYSICAL THERAPY LOWER EXTREMITY LYMPHEDEMA TREATMENT  Patient Name: Savannah [name] MRN: [id number] DOB:[birth year], 67 y.o., female Today's Date: 04/05/2023  END OF SESSION:  PT End of Session - 04/05/23 1159     Visit Number 11    Number of Visits 25    Date for PT Re-Evaluation 04/24/23    PT Start Time 1100    PT Stop Time 1157    PT Time Calculation (min) 57 min    Activity Tolerance Patient tolerated treatment well    Behavior During Therapy [facility] for tasks assessed/performed                Past Medical History:  Diagnosis Date   ABDOMINAL PAIN, CHRONIC 10/20/2009   Acute cystitis 08/17/2009   ALLERGIC RHINITIS 11/21/2007   Allergy    ASYMPTOMATIC POSTMENOPAUSAL STATUS 09/29/2008   Bariatric surgery status 07/07/2010   Bariatric surgery status 07/07/2010   Qualifier: Diagnosis of  By: Everardo All MD, Sean A    Cancer [facility])    uterine   Cataract    Coronary artery disease    DEPRESSION 11/21/2007   DIABETES MELLITUS, TYPE II 07/08/2007   DYSPNEA 05/20/2009   Edema 05/20/2009   Eosinophilic esophagitis 02/13/2008   FEVER UNSPECIFIED 10/20/2009   FEVER, HX OF 03/09/2010   GERD (gastroesophageal reflux disease)    Headache(784.0) 02/06/2010   Hepatomegaly 01/06/2008   HYPERLIPIDEMIA 07/08/2007   HYPERTENSION 07/08/2007   LEUKOPENIA, MILD 09/29/2008   Melanoma in situ of left upper arm (HCC) 04/03/2023   OBESITY 01/06/2008   OSTEOARTHRITIS 01/06/2008   Other chronic nonalcoholic liver disease 01/06/2008   PERIPHERAL NEUROPATHY 11/21/2007   Peripheral neuropathy    Postsurgical hypothyroidism 09/19/2010   SINUSITIS- ACUTE-NOS 11/21/2007   TB SKIN TEST, POSITIVE 02/06/2010   THYROID NODULE 03/09/2010   Past Surgical History:  Procedure Laterality Date   ABDOMINAL HYSTERECTOMY     BASAL CELL CARCINOMA EXCISION     CARDIAC CATHETERIZATION     CHOLECYSTECTOMY N/A 05/11/2021   Procedure: LAPAROSCOPIC CHOLECYSTECTOMY WITH POSSIBLE  INTRAOPERATIVE  CHOLANGIOGRAM;  Surgeon: Luretha Murphy, MD;  Location: [facility] OR[facility];  Service: General;  Laterality: N/A;   COLONOSCOPY     CORONARY ATHERECTOMY N/A 05/03/2022   Procedure: CORONARY ATHERECTOMY;  Surgeon: Orbie Pyo, MD;  Location: [facility] INVASIVE CV LAB;  Service: Cardiovascular;  Laterality: N/A;   CORONARY IMAGING/OCT N/A 05/03/2022   Procedure: INTRAVASCULAR IMAGING/OCT;  Surgeon: Orbie Pyo, MD;  Location: [facility] INVASIVE CV LAB;  Service: Cardiovascular;  Laterality: N/A;   CORONARY PRESSURE/FFR STUDY N/A 05/03/2022   Procedure: INTRAVASCULAR PRESSURE WIRE/FFR STUDY;  Surgeon: Orbie Pyo, MD;  Location: [facility] CV LAB;  Service: Cardiovascular;  Laterality: N/A;   CORONARY STENT INTERVENTION N/A 05/03/2022   Procedure: CORONARY STENT INTERVENTION;  Surgeon: Orbie Pyo, MD;  Location: [facility] CV [facility];  Service: Cardiovascular;  Laterality: N/A;  lad   EYE SURGERY Bilateral 08/2018, 09/2018   FOOT SURGERY Left    10/2019   KNEE ARTHROSCOPY Left    LEFT HEART CATH AND CORONARY ANGIOGRAPHY N/A 01/20/2019   Procedure: LEFT HEART CATH AND CORONARY ANGIOGRAPHY;  Surgeon: Lyn Records, MD;  Location: [facility];  Service: Cardiovascular;  Laterality: N/A;   LYMPH NODE DISSECTION     OOPHORECTOMY     Right total knee replacement     RIGHT/LEFT HEART CATH AND CORONARY ANGIOGRAPHY N/A 05/03/2022   Procedure: RIGHT/LEFT HEART CATH AND CORONARY ANGIOGRAPHY;  Surgeon: Orbie Pyo,  MD;  Location: [facility];  Service: Cardiovascular;  Laterality: N/A;   ROUX-EN-Y GASTRIC BYPASS  2011   SKIN TAG REMOVAL     THYROIDECTOMY     TONSILLECTOMY AND ADENOIDECTOMY     TOOTH EXTRACTION  2023   UPPER GASTROINTESTINAL ENDOSCOPY     Patient Active Problem List   Diagnosis Date Noted   Bilateral carpal tunnel syndrome 03/13/2022   Leg edema 03/01/2022   Nondisplaced comminuted fracture of left patella, initial encounter for closed fracture 08/01/2021   Arthritis of  carpometacarpal (CMC) joint of right thumb 07/28/2021   Primary osteoarthritis of right distal radioulnar joint 07/28/2021   Arthritis of wrist, right, degenerative 07/28/2021   History of malignant neoplasm of uterine body 07/27/2021   Osteopenia 01/14/2021   Sesamoiditis of left foot 11/09/2020   Posterior tibial tendinitis of left lower extremity 11/09/2020   Type 2 diabetes mellitus with diabetic peripheral angiopathy without gangrene, with long-term current use of insulin (HCC) 10/21/2020   Iron deficiency anemia 03/14/2020   Lesion of vulva 09/02/2019   Menorrhagia 09/02/2019   Neuropathy 09/02/2019   Irregular bowel habits 06/25/2019   Coronary artery disease involving native coronary artery of native heart without angina pectoris    Temporomandibular joint disorder 12/05/2018   Fatigue 12/05/2018   Inverted nipple 12/05/2018   Senile purpura (HCC) 11/21/2018   GERD with esophagitis 11/21/2018   Pseudogout involving multiple joints 11/21/2018   Lipoma 07/22/2017   Lichen sclerosus 11/16/2016   H/O gastric bypass 11/16/2016   Depression 11/16/2016   Diabetic polyneuropathy associated with type 2 diabetes mellitus (HCC) 11/16/2016   Uterine cancer (HCC) s/p hysterectomy 1997 01/04/2012   Postsurgical hypothyroidism 09/19/2010   Diabetic neuropathy (HCC) 11/21/2007   Allergic rhinitis 11/21/2007   Dyslipidemia associated with type 2 diabetes mellitus (HCC) 07/08/2007    PCP: [name], MD  REFERRING PROVIDER: Ardith Dark, MD  REFERRING DIAG: R60.0 (ICD-10-CM) - Leg edema  THERAPY DIAG:  Lymphedema, not elsewhere classified  Difficulty in walking, not elsewhere classified  Rationale for Evaluation and Treatment: Rehabilitation  ONSET DATE: Prior to 1997  SUBJECTIVE:                                                                                                                                                                                            SUBJECTIVE STATEMENT:   I have been doing pretty good with the wrapping.   EVAL: I have been having swelling since before 1997 that was mostly in my ankles. Then after my surgery for uterine cancer it worsened on the L leg. I take lasix but it  has minimal effect. I have never had lymphedema therapy. I had prescription strength thigh highs but they did not help.   PERTINENT HISTORY:  Uterine cancer in 1997 with lymph node removal (pt reports the surgeon told her he got all the lymph nodes he could find bilaterally), pt reports her vascular doctor told her the left leg edema was vascular, diabetic, has hx of cardiac stent   PAIN:  Are you having pain? No  PRECAUTIONS: Other: R Knee TKA  WEIGHT BEARING RESTRICTIONS: No  FALLS:  Has patient fallen in last 6 months? Yes. Number of falls 3 or 4 Falls due to feeling unsteady, has neuropathy, BP has been an issue - low  PATIENT GOALS: to get the swelling down   OBJECTIVE:  SENSATION: pt reports neuropathy in bilateral feet  LYMPHEDEMA ASSESSMENTS:   LE LANDMARK RIGHT EVAL  At groin   30 cm proximal to suprapatella   20 cm proximal to suprapatella 47  10 cm proximal to suprapatella 40.3  At midpatella / popliteal crease 39  30 cm proximal to floor at lateral plantar foot 32.1  20 cm proximal to floor at lateral plantar foot 26.1  10 cm proximal to floor at lateral plantar foot 25.5  Circumference of ankle/heel   5 cm proximal to 1st MTP joint 22.7  Across MTP joint 24  Around proximal great toe 8  (Blank rows = not tested)  LE LANDMARK LEFT EVAL 03/18/23 03/27/23 04/05/23  At groin      30 cm proximal to suprapatella      20 cm proximal to suprapatella 46 47 44.5   10 cm proximal to suprapatella 41.2 43 41 41  At midpatella / popliteal crease 40.3 40.3 39.5 40  30 cm proximal to floor at lateral plantar foot 37.5 37.7 32 32.3  20 cm proximal to floor at lateral plantar foot 30.3 31.2 28.1 27.5  10 cm proximal to floor at  lateral plantar foot 28.1 27.6 28 26.5  Circumference of ankle/heel      5 cm proximal to 1st MTP joint 25 24.7 23.5 24  Across MTP joint 23.4 24 23.3 23.3  Around proximal great toe 8.7 8.7 8.9 8.6  (Blank rows = not tested)   TODAY'S TREATMENT:                                                                                                                                         DATE:  04/05/23 Manual Therapy  Remeasured LE MLD to Lt LE as follows: Supine with bil LE's on bolster: Short neck, superficial and deep abdominals, Left axillary nodes, Lt inguino-axillary anastomosis, then Lt LE working proximal to distal and then back proximally reversing all steps.  Focused on areas of increased swelling at the foot and ankle.   03/29/23: Manual Therapy  MLD to Lt LE as follows: Supine with bil LE's on bolster:  Short neck, superficial and deep abdominals, Left axillary nodes, Lt inguino-axillary anastomosis, then Lt LE working proximal to distal and then back proximally reversing all steps.  Focused on areas of increased swelling at the foot and ankle.  Compression Bandaging to Lt LE as follows: (knee high only) Cocoa butter to Lt LE including foot/ankle, TG soft x1 from foot to knee, Elastomull to toes 1-4, Artiflex to foot/ankle, and rosidal soft to knee, then 6 cm in Roman sandal, then 8 cm ASH, 2 - 10 cm from ankle to knee,  Pt will remove for pedicure and then will rewrap her own leg later.     03/27/23: Manual Therapy  MLD to Lt LE as follows: Supine with bil LE's on bolster: Short neck, superficial and deep abdominals, Left axillary nodes, Lt inguino-axillary anastomosis, then Lt LE working proximal to distal and then back proximally reversing all steps.  Focused on areas of increased swelling at the foot and ankle.  Was measured by [facility] for compression garments today. She will get a flat knit 20-30 class 2 thigh high medi cosy with sensitive or silicone free top and open toe due to  silicone sensitivity and with knee flexure zone due to sensitivity at the back of the knee when bandaging. We will start with 1 unit of day and night to make sure that it fits well and is comfortable for the patient.  She needs a custom garment because she has a sensitivity to silicone which is the only material available  in off the shelf.  She has stage 3 lymphedema that has reduced moderately from starting CDT and which fills up moderately quickly when not compressed.  She would also then benefit from a night garment to prevent refill and to decrease lower leg fibrosis which is present from the top of the foot to mid calf currently.  +2 pitting status. She has difficulty putting on socks with no compression so we will request a donning aid for the stocking as well.   Compression Bandaging to Lt LE as follows: (knee high only) Cocoa butter to Lt LE including foot/ankle, TG soft x1 from foot to knee, Elastomull to toes 1-4, Artiflex to foot/ankle, and rosidal soft to knee, then 6 cm in Roman sandal, then 8 cm ASH, 2 - 10 cm from ankle to knee,  Pt will remove for pedicure and then will rewrap her own leg later.     03/25/23: Manual Therapy Taught pt self MLD in seated with cueing and instruction for all steps. Then  MLD to Lt LE as follows: Supine with bil LE's on bolster: Short neck, superficial and deep abdominals, Left axillary nodes, Lt inguino-axillary anastomosis, then Lt LE working proximal to distal and then back proximally reversing all steps.  Focused on areas of increased swelling at the foot and ankle.  Compression Bandaging to Lt LE as follows: (trial of knee high only) Cocoa butter to Lt LE including foot/ankle, TG soft x1 from foot to knee, Elastomull to toes 1-4, Artiflex to foot/ankle, 1/2" gray compression foam to dorsal foot covering lateral malleolous, and 1/2" gray foam "wagon wheel" to medial malleolous fixated with 6 cm in Roman sandal, then 8 cm ASH, 2 - 10 cm from ankle to knee,  Pt  knows to remove if thigh gets more swollen and to incorporate some MLD proximally to help.   03/22/23: Manual Therapy MLD to Lt LE as follows: Supine with bil LE's on bolster: Short neck, superficial and deep abdominals, Left  axillary nodes, Lt inguino-axillary anastomosis, then Lt LE working proximal to distal and then back proximally reversing all steps.  Focused on areas of increased swelling at the foot and ankle.  Compression Bandaging to Lt LE as follows: Cocoa butter to Lt LE including foot/ankle, TG soft x1 from foot to thigh, Elastomull to toes 1-4, Artiflex to foot/ankle, 1/2" gray compression foam to dorsal foot covering lateral malleolous, and 1/2" gray foam "wagon wheel" to medial malleolous fixated with 6 cm in Roman sandal, then 8 cm ASH, 2 - 10 cm from ankle to knee, first spiral, next herring bone, then 2 - 12 cm, first starting below knee with "X" at popliteal fossa and next start at knee and spiral to near groin.   03/20/23: Manual Therapy MLD to Lt LE as follows: Supine with bil LE's on bolster: Short neck, superficial and deep abdominals, Left axillary nodes, Lt inguino-axillary anastomosis, then Lt LE working proximal to distal and then back proximally reversing all steps.  Focused on areas of increased swelling at the foot and ankle.  Compression Bandaging to Lt LE as follows: Cocoa butter to Lt LE including foot/ankle, TG soft x1 from foot to thigh, Elastomull to toes 1-4, Artiflex to foot/ankle, 1/2" gray compression foam to dorsal foot covering lateral malleolous, and 1/2" gray foam "wagon wheel" to medial malleolous fixated with 6 cm in Roman sandal, then 8 cm ASH, 2 - 10 cm from ankle to knee, first spiral, next herring bone, then 2 - 12 cm, first starting below knee with "X" at popliteal fossa and next start at knee and spiral to near groin.   03/18/23 Remeasured leg Started education on self bandaging. Gave link to MD anderson bandaging video. Verbal instruction, demo, and  cueing to complete bandage.  Pt is able to complete the foot part in seated with the leg crossed over at the ankle and was able to reach toes 1-3.  Applied all bandages successfully with cueing and education.  Gave pt new set of bandages and new tg soft. Then PT reapplied bandages and donned cast shoe.   Pt will apply bandages each morning after showering Pt may be interested in velcro  03/15/23 Supine legs on wedge: MLD: Left axillary nodes, Lt inguinal nodes, superficial and deep abdominals with breath, Lt inguinal-axillary anastomosis, then Lt LE working proximal to distal and then back proximally reversing all steps.  Focused on areas of increased swelling at the foot and ankle.   Large tg soft thigh, medium tg soft to lower leg, toes 1-4 with mollelast, rectangle rosidal soft and artiflex for foot and ankle shaping, then rosidal soft from ankle to knee and then knee to thigh, 1 8cm roman sandal, 1 10cm ASH, 1 10cm from ankle to knee, 1 12cm from below then knee to the hip and then another 12cm from below the knee to the hip.       PATIENT EDUCATION:  Education details: anatomy and physiology of the lymphatic system, need for day and night time compression garments, compression bandaging vs compression garments, venous insufficiency vs lymphedema Person educated: Patient Education method: Explanation Education comprehension: verbalized understanding  HOME EXERCISE PROGRAM: Get post op shoes and appropriate clothing to begin bandaging at next session  ASSESSMENT:  CLINICAL IMPRESSION: Pt is doing well with her own bandaging. She has maintained all measurements and even has less pitting at the lower leg. We will continue 1x per week to assess maintenance until garments come.  We will submit for flexitouch  benefits check today as pt has interest in this still.  See below for flexitouch checklist.      OBJECTIVE IMPAIRMENTS: decreased knowledge of condition, decreased knowledge of use of DME,  difficulty walking, increased edema, and pain.   ACTIVITY LIMITATIONS: standing and locomotion level  PARTICIPATION LIMITATIONS: community activity  PERSONAL FACTORS: Fitness and Time since onset of injury/illness/exacerbation are also affecting patient's functional outcome.   REHAB POTENTIAL: Good  CLINICAL DECISION MAKING: Evolving/moderate complexity  EVALUATION COMPLEXITY: Moderate   GOALS: Goals reviewed with patient? Yes  SHORT TERM GOALS: Target date: 03/27/23  Pt will demonstrate a 3 cm decrease in edema at 30 cm superior to floor at lateral malleoli.  Baseline: Goal status: MET   LONG TERM GOALS: Target date: 04/24/23  Pt will be independent in self MLD for long term management of lymphedema.  Baseline:  Goal status: MET  2.  Pt will receive trial of FlexiTouch compression pump for long term management of lymphedema.  Baseline:  Goal status: ONGOING  3.  Pt will obtain appropriate day and night time garments for long term management of lymphedema.  Baseline:  Goal status: ONGOING  4.  Pt will demonstrate maximal circumferential reduction to decrease risk of cellulitis and improve mobility.  Baseline:  Goal status: MOSTLY MET    PLAN:  PT FREQUENCY: 3x/week  PT DURATION: 4 weeks  PLANNED INTERVENTIONS: Therapeutic exercises, Therapeutic activity, Patient/Family education, Self Care, Orthotic/Fit training, Manual lymph drainage, Compression bandaging, Taping, Vasopneumatic device, Manual therapy, and Re-evaluation  PLAN FOR NEXT SESSION: measure at least on Mondays. send in pump demo and notes around 5/20 if interested, cont CDT to L LE, teach pt bandaging and MLD as able.    Idamae Lusher, PT 04/05/2023, 11:59 AM    DIAGNOSIS ASSESSMENT [x]  Lymphedema, not elsewhere classified [I89.0]  Secondary to: Uterine cancer treatment  []  Post-mastectomy lymphedema [I97.2]   []  Hereditary/Congenital lymphedema [Q82.0]  []  Praecox []  Tarda []  Other:    LOCATION OF LYMPHEDEMA []   Right Arm  []  Left Arm  []  Chest/Breast  []  Abdomen/Trunk [x]  Other: Lt Lower extremity lymphedema  Date of onset, or duration:   SEVERITY Symptoms and observations from physical exam Stage of Lymphedema:  [x] Stage II  []  Stage III  [] Other: []  Hyperkeratosis []  Hyperpigmentation []  Lymphorrhea (weeping) []  Papillomatosis (warts, nodules, papules)  []  Elephantiasis  [x]  Fibrosis and/or radiation fibrosis [x]  Pitting edema []  Recurrent episode(s) of infection/cellulitis []  Pain []  Peau d'orange []  Chest and/or axillary swelling caused by: []  History of chest/breast/abdominal surgery(ies) []  Lymph node dissection, radiation and or chemotherapy []  Truncal and/or abdominal swelling []  Axillary cording [x]  Other, explain: lymphedema in the suprapubic area - improved currently but could worsen without pump coverage   CONSERVATIVE TREATMENT (CT) INITATION Start date (if different from this visit date):02/27/23  Conservative treatment plan includes the following activities (check all that apply): [x]  Regular exercise  [x]  Elevation of the limb(s) [x]  Compression garment(s) or compression bandage system  []  Compression garments with a minimum of             mmHg distally  []  Compression bra and/or tank top [x]  Compression bandage system  Type: [x]  Multi-layer short stretch []  Velcro reduction kit    []  Two- or three-layer prepackaged compression kit   []  Other:  [x]  Complete decongestive therapy (CDT) or lymphedema therapy   [x]  Minimum of 30 minutes daily MLD or self-MLD  [x]  Diet assessment with modification, if applicable. []  For patients with  congestive heart failure (CHF) a medication assessment with modification(s), if applicable. []  Other:   Has the patient tried an [id number] basic pneumatic compression device? []  Yes  [x]  No Comments:   MEASUREMENTS Choose at least two anatomical locations. Abdominal measurement at umbilicus: 105.5 cm See above  for other leg measurements

## 2023-04-09 ENCOUNTER — Encounter: Payer: Medicare Other | Admitting: Rehabilitation

## 2023-04-10 ENCOUNTER — Telehealth: Payer: Self-pay | Admitting: Family Medicine

## 2023-04-10 ENCOUNTER — Other Ambulatory Visit: Payer: Self-pay | Admitting: *Deleted

## 2023-04-10 ENCOUNTER — Encounter: Payer: Medicare Other | Admitting: Rehabilitation

## 2023-04-10 ENCOUNTER — Telehealth: Payer: Self-pay | Admitting: Pharmacist

## 2023-04-10 DIAGNOSIS — E1151 Type 2 diabetes mellitus with diabetic peripheral angiopathy without gangrene: Secondary | ICD-10-CM

## 2023-04-10 DIAGNOSIS — R7401 Elevation of levels of liver transaminase levels: Secondary | ICD-10-CM

## 2023-04-10 NOTE — Telephone Encounter (Signed)
Pt would like a call back with lab results 

## 2023-04-10 NOTE — Telephone Encounter (Signed)
This patient has been identified as "high risk" and in the top 25% of risk stratification for [facility] patients.  These patients were identified using a number of factors including # of hospitalizations, ED visits, HF exacerbations, elevated BP and A1c, and overall cost of care.   Referral placed for cosign by the PCP.  [facility] team to schedule once cosigned.  Emberlin Verner, PharmD Clinical Pharmacist  [facility] [phone]  

## 2023-04-11 NOTE — Telephone Encounter (Signed)
See results note. 

## 2023-04-12 ENCOUNTER — Encounter: Payer: Self-pay | Admitting: Rehabilitation

## 2023-04-12 ENCOUNTER — Ambulatory Visit: Payer: Medicare Other | Admitting: Rehabilitation

## 2023-04-12 DIAGNOSIS — M25662 Stiffness of left knee, not elsewhere classified: Secondary | ICD-10-CM | POA: Diagnosis not present

## 2023-04-12 DIAGNOSIS — I890 Lymphedema, not elsewhere classified: Secondary | ICD-10-CM | POA: Diagnosis not present

## 2023-04-12 DIAGNOSIS — R262 Difficulty in walking, not elsewhere classified: Secondary | ICD-10-CM

## 2023-04-12 NOTE — Therapy (Signed)
OUTPATIENT PHYSICAL THERAPY LOWER EXTREMITY LYMPHEDEMA TREATMENT  Patient Name: Savannah [name] MRN: [id number] DOB:04/28/1956, 67 y.o., female Today's Date: 04/12/2023  END OF SESSION:  PT End of Session - 04/12/23 1200     Visit Number 12    Number of Visits 25    Date for PT Re-Evaluation 04/24/23    PT Start Time 1103    PT Stop Time 1158    PT Time Calculation (min) 55 min    Activity Tolerance Patient tolerated treatment well    Behavior During Therapy WFL for tasks assessed/performed                 Past Medical History:  Diagnosis Date   ABDOMINAL PAIN, CHRONIC 10/20/2009   Acute cystitis 08/17/2009   ALLERGIC RHINITIS 11/21/2007   Allergy    ASYMPTOMATIC POSTMENOPAUSAL STATUS 09/29/2008   Bariatric surgery status 07/07/2010   Bariatric surgery status 07/07/2010   Qualifier: Diagnosis of  By: Everardo All MD, Sean A    Cancer [facility])    uterine   Cataract    Coronary artery disease    DEPRESSION 11/21/2007   DIABETES MELLITUS, TYPE II 07/08/2007   DYSPNEA 05/20/2009   Edema 05/20/2009   Eosinophilic esophagitis 02/13/2008   FEVER UNSPECIFIED 10/20/2009   FEVER, HX OF 03/09/2010   GERD (gastroesophageal reflux disease)    Headache(784.0) 02/06/2010   Hepatomegaly 01/06/2008   HYPERLIPIDEMIA 07/08/2007   HYPERTENSION 07/08/2007   LEUKOPENIA, MILD 09/29/2008   Melanoma in situ of left upper arm (HCC) 04/03/2023   OBESITY 01/06/2008   OSTEOARTHRITIS 01/06/2008   Other chronic nonalcoholic liver disease 01/06/2008   PERIPHERAL NEUROPATHY 11/21/2007   Peripheral neuropathy    Postsurgical hypothyroidism 09/19/2010   SINUSITIS- ACUTE-NOS 11/21/2007   TB SKIN TEST, POSITIVE 02/06/2010   THYROID NODULE 03/09/2010   Past Surgical History:  Procedure Laterality Date   ABDOMINAL HYSTERECTOMY     BASAL CELL CARCINOMA EXCISION     CARDIAC CATHETERIZATION     CHOLECYSTECTOMY N/A 05/11/2021   Procedure: LAPAROSCOPIC CHOLECYSTECTOMY WITH POSSIBLE  INTRAOPERATIVE  CHOLANGIOGRAM;  Surgeon: Luretha Murphy, MD;  Location: [facility];  Service: General;  Laterality: N/A;   COLONOSCOPY     CORONARY ATHERECTOMY N/A 05/03/2022   Procedure: CORONARY ATHERECTOMY;  Surgeon: Orbie Pyo, MD;  Location: [facility];  Service: Cardiovascular;  Laterality: N/A;   CORONARY IMAGING/OCT N/A 05/03/2022   Procedure: INTRAVASCULAR IMAGING/OCT;  Surgeon: Orbie Pyo, MD;  Location: [facility];  Service: Cardiovascular;  Laterality: N/A;   CORONARY PRESSURE/FFR STUDY N/A 05/03/2022   Procedure: INTRAVASCULAR PRESSURE WIRE/FFR STUDY;  Surgeon: Orbie Pyo, MD;  Location: [facility];  Service: Cardiovascular;  Laterality: N/A;   CORONARY STENT INTERVENTION N/A 05/03/2022   Procedure: CORONARY STENT INTERVENTION;  Surgeon: Orbie Pyo, MD;  Location: [facility];  Service: Cardiovascular;  Laterality: N/A;  lad   EYE SURGERY Bilateral 08/2018, 09/2018   FOOT SURGERY Left    10/2019   KNEE ARTHROSCOPY Left    LEFT HEART CATH AND CORONARY ANGIOGRAPHY N/A 01/20/2019   Procedure: LEFT HEART CATH AND CORONARY ANGIOGRAPHY;  Surgeon: Lyn Records, MD;  Location: [facility];  Service: Cardiovascular;  Laterality: N/A;   LYMPH NODE DISSECTION     OOPHORECTOMY     Right total knee replacement     RIGHT/LEFT HEART CATH AND CORONARY ANGIOGRAPHY N/A 05/03/2022   Procedure: RIGHT/LEFT HEART CATH AND CORONARY ANGIOGRAPHY;  Surgeon: Alverda Skeans  K, MD;  Location: [facility];  Service: Cardiovascular;  Laterality: N/A;   ROUX-EN-Y GASTRIC BYPASS  2011   SKIN TAG REMOVAL     THYROIDECTOMY     TONSILLECTOMY AND ADENOIDECTOMY     TOOTH EXTRACTION  2023   UPPER GASTROINTESTINAL ENDOSCOPY     Patient Active Problem List   Diagnosis Date Noted   Bilateral carpal tunnel syndrome 03/13/2022   Leg edema 03/01/2022   Nondisplaced comminuted fracture of left patella, initial encounter for closed fracture 08/01/2021   Arthritis of  carpometacarpal (CMC) joint of right thumb 07/28/2021   Primary osteoarthritis of right distal radioulnar joint 07/28/2021   Arthritis of wrist, right, degenerative 07/28/2021   History of malignant neoplasm of uterine body 07/27/2021   Osteopenia 01/14/2021   Sesamoiditis of left foot 11/09/2020   Posterior tibial tendinitis of left lower extremity 11/09/2020   Type 2 diabetes mellitus with diabetic peripheral angiopathy without gangrene, with long-term current use of insulin (HCC) 10/21/2020   Iron deficiency anemia 03/14/2020   Lesion of vulva 09/02/2019   Menorrhagia 09/02/2019   Neuropathy 09/02/2019   Irregular bowel habits 06/25/2019   Coronary artery disease involving native coronary artery of native heart without angina pectoris    Temporomandibular joint disorder 12/05/2018   Fatigue 12/05/2018   Inverted nipple 12/05/2018   Senile purpura (HCC) 11/21/2018   GERD with esophagitis 11/21/2018   Pseudogout involving multiple joints 11/21/2018   Lipoma 07/22/2017   Lichen sclerosus 11/16/2016   H/O gastric bypass 11/16/2016   Depression 11/16/2016   Diabetic polyneuropathy associated with type 2 diabetes mellitus (HCC) 11/16/2016   Uterine cancer (HCC) s/p hysterectomy 1997 01/04/2012   Postsurgical hypothyroidism 09/19/2010   Diabetic neuropathy (HCC) 11/21/2007   Allergic rhinitis 11/21/2007   Dyslipidemia associated with type 2 diabetes mellitus (HCC) 07/08/2007    PCP: [name], MD  REFERRING PROVIDER: Ardith Dark, MD  REFERRING DIAG: R60.0 (ICD-10-CM) - Leg edema  THERAPY DIAG:  Lymphedema, not elsewhere classified  Difficulty in walking, not elsewhere classified  Rationale for Evaluation and Treatment: Rehabilitation  ONSET DATE: Prior to 1997  SUBJECTIVE:                                                                                                                                                                                            SUBJECTIVE STATEMENT:   I have been doing pretty good with the wrapping.   EVAL: I have been having swelling since before 1997 that was mostly in my ankles. Then after my surgery for uterine cancer it worsened on the L leg. I take lasix but  it has minimal effect. I have never had lymphedema therapy. I had prescription strength thigh highs but they did not help.   PERTINENT HISTORY:  Uterine cancer in 1997 with lymph node removal (pt reports the surgeon told her he got all the lymph nodes he could find bilaterally), pt reports her vascular doctor told her the left leg edema was vascular, diabetic, has hx of cardiac stent   PAIN:  Are you having pain? No  PRECAUTIONS: Other: R Knee TKA  WEIGHT BEARING RESTRICTIONS: No  FALLS:  Has patient fallen in last 6 months? Yes. Number of falls 3 or 4 Falls due to feeling unsteady, has neuropathy, BP has been an issue - low  PATIENT GOALS: to get the swelling down   OBJECTIVE:  SENSATION: pt reports neuropathy in bilateral feet  LYMPHEDEMA ASSESSMENTS:   LE LANDMARK RIGHT EVAL  At groin   30 cm proximal to suprapatella   20 cm proximal to suprapatella 47  10 cm proximal to suprapatella 40.3  At midpatella / popliteal crease 39  30 cm proximal to floor at lateral plantar foot 32.1  20 cm proximal to floor at lateral plantar foot 26.1  10 cm proximal to floor at lateral plantar foot 25.5  Circumference of ankle/heel   5 cm proximal to 1st MTP joint 22.7  Across MTP joint 24  Around proximal great toe 8  (Blank rows = not tested)  LE LANDMARK LEFT EVAL 03/18/23 03/27/23 04/05/23  At groin      30 cm proximal to suprapatella      20 cm proximal to suprapatella 46 47 44.5   10 cm proximal to suprapatella 41.2 43 41 41  At midpatella / popliteal crease 40.3 40.3 39.5 40  30 cm proximal to floor at lateral plantar foot 37.5 37.7 32 32.3  20 cm proximal to floor at lateral plantar foot 30.3 31.2 28.1 27.5  10 cm proximal to floor at  lateral plantar foot 28.1 27.6 28 26.5  Circumference of ankle/heel      5 cm proximal to 1st MTP joint 25 24.7 23.5 24  Across MTP joint 23.4 24 23.3 23.3  Around proximal great toe 8.7 8.7 8.9 8.6  (Blank rows = not tested)   TODAY'S TREATMENT:                                                                                                                                         DATE:  04/12/23 Manual Therapy  MLD to Lt LE as follows: Supine with bil LE's on bolster: Short neck, superficial and deep abdominals, Left axillary nodes, Lt inguino-axillary anastomosis, then Lt LE working proximal to distal and then back proximally reversing all steps.  Focused on areas of increased swelling at the foot and ankle.  Compression Bandaging to Lt LE as follows: (knee high only) Cocoa butter to Lt LE including foot/ankle, TG  soft x1 from foot to knee, Elastomull to toes 1-4, Artiflex to foot/ankle, and rosidal soft to knee, then 6 cm in Roman sandal, then 8 cm ASH, 2 - 10 cm from ankle to knee,   04/05/23 Manual Therapy  Remeasured LE MLD to Lt LE as follows: Supine with bil LE's on bolster: Short neck, superficial and deep abdominals, Left axillary nodes, Lt inguino-axillary anastomosis, then Lt LE working proximal to distal and then back proximally reversing all steps.  Focused on areas of increased swelling at the foot and ankle.   03/29/23: Manual Therapy  MLD to Lt LE as follows: Supine with bil LE's on bolster: Short neck, superficial and deep abdominals, Left axillary nodes, Lt inguino-axillary anastomosis, then Lt LE working proximal to distal and then back proximally reversing all steps.  Focused on areas of increased swelling at the foot and ankle.  Compression Bandaging to Lt LE as follows: (knee high only) Cocoa butter to Lt LE including foot/ankle, TG soft x1 from foot to knee, Elastomull to toes 1-4, Artiflex to foot/ankle, and rosidal soft to knee, then 6 cm in Roman sandal, then 8 cm ASH, 2  - 10 cm from ankle to knee,  Pt will remove for pedicure and then will rewrap her own leg later.     03/27/23: Manual Therapy  MLD to Lt LE as follows: Supine with bil LE's on bolster: Short neck, superficial and deep abdominals, Left axillary nodes, Lt inguino-axillary anastomosis, then Lt LE working proximal to distal and then back proximally reversing all steps.  Focused on areas of increased swelling at the foot and ankle.  Was measured by [facility]idy for compression garments today. She will get a flat knit 20-30 class 2 thigh high medi cosy with sensitive or silicone free top and open toe due to silicone sensitivity and with knee flexure zone due to sensitivity at the back of the knee when bandaging. We will start with 1 unit of day and night to make sure that it fits well and is comfortable for the patient.  She needs a custom garment because she has a sensitivity to silicone which is the only material available  in off the shelf.  She has stage 3 lymphedema that has reduced moderately from starting CDT and which fills up moderately quickly when not compressed.  She would also then benefit from a night garment to prevent refill and to decrease lower leg fibrosis which is present from the top of the foot to mid calf currently.  +2 pitting status. She has difficulty putting on socks with no compression so we will request a donning aid for the stocking as well.   Compression Bandaging to Lt LE as follows: (knee high only) Cocoa butter to Lt LE including foot/ankle, TG soft x1 from foot to knee, Elastomull to toes 1-4, Artiflex to foot/ankle, and rosidal soft to knee, then 6 cm in Roman sandal, then 8 cm ASH, 2 - 10 cm from ankle to knee,  Pt will remove for pedicure and then will rewrap her own leg later.     03/25/23: Manual Therapy Taught pt self MLD in seated with cueing and instruction for all steps. Then  MLD to Lt LE as follows: Supine with bil LE's on bolster: Short neck, superficial and deep  abdominals, Left axillary nodes, Lt inguino-axillary anastomosis, then Lt LE working proximal to distal and then back proximally reversing all steps.  Focused on areas of increased swelling at the foot and ankle.  Compression  Bandaging to Lt LE as follows: (trial of knee high only) Cocoa butter to Lt LE including foot/ankle, TG soft x1 from foot to knee, Elastomull to toes 1-4, Artiflex to foot/ankle, 1/2" gray compression foam to dorsal foot covering lateral malleolous, and 1/2" gray foam "wagon wheel" to medial malleolous fixated with 6 cm in Roman sandal, then 8 cm ASH, 2 - 10 cm from ankle to knee,  Pt knows to remove if thigh gets more swollen and to incorporate some MLD proximally to help.   03/22/23: Manual Therapy MLD to Lt LE as follows: Supine with bil LE's on bolster: Short neck, superficial and deep abdominals, Left axillary nodes, Lt inguino-axillary anastomosis, then Lt LE working proximal to distal and then back proximally reversing all steps.  Focused on areas of increased swelling at the foot and ankle.  Compression Bandaging to Lt LE as follows: Cocoa butter to Lt LE including foot/ankle, TG soft x1 from foot to thigh, Elastomull to toes 1-4, Artiflex to foot/ankle, 1/2" gray compression foam to dorsal foot covering lateral malleolous, and 1/2" gray foam "wagon wheel" to medial malleolous fixated with 6 cm in Roman sandal, then 8 cm ASH, 2 - 10 cm from ankle to knee, first spiral, next herring bone, then 2 - 12 cm, first starting below knee with "X" at popliteal fossa and next start at knee and spiral to near groin.   03/20/23: Manual Therapy MLD to Lt LE as follows: Supine with bil LE's on bolster: Short neck, superficial and deep abdominals, Left axillary nodes, Lt inguino-axillary anastomosis, then Lt LE working proximal to distal and then back proximally reversing all steps.  Focused on areas of increased swelling at the foot and ankle.  Compression Bandaging to Lt LE as follows: Cocoa  butter to Lt LE including foot/ankle, TG soft x1 from foot to thigh, Elastomull to toes 1-4, Artiflex to foot/ankle, 1/2" gray compression foam to dorsal foot covering lateral malleolous, and 1/2" gray foam "wagon wheel" to medial malleolous fixated with 6 cm in Roman sandal, then 8 cm ASH, 2 - 10 cm from ankle to knee, first spiral, next herring bone, then 2 - 12 cm, first starting below knee with "X" at popliteal fossa and next start at knee and spiral to near groin.   03/18/23 Remeasured leg Started education on self bandaging. Gave link to MD anderson bandaging video. Verbal instruction, demo, and cueing to complete bandage.  Pt is able to complete the foot part in seated with the leg crossed over at the ankle and was able to reach toes 1-3.  Applied all bandages successfully with cueing and education.  Gave pt new set of bandages and new tg soft. Then PT reapplied bandages and donned cast shoe.   Pt will apply bandages each morning after showering Pt may be interested in velcro  03/15/23 Supine legs on wedge: MLD: Left axillary nodes, Lt inguinal nodes, superficial and deep abdominals with breath, Lt inguinal-axillary anastomosis, then Lt LE working proximal to distal and then back proximally reversing all steps.  Focused on areas of increased swelling at the foot and ankle.   Large tg soft thigh, medium tg soft to lower leg, toes 1-4 with mollelast, rectangle rosidal soft and artiflex for foot and ankle shaping, then rosidal soft from ankle to knee and then knee to thigh, 1 8cm roman sandal, 1 10cm ASH, 1 10cm from ankle to knee, 1 12cm from below then knee to the hip and then another 12cm from below  the knee to the hip.       PATIENT EDUCATION:  Education details: anatomy and physiology of the lymphatic system, need for day and night time compression garments, compression bandaging vs compression garments, venous insufficiency vs lymphedema Person educated: Patient Education method:  Explanation Education comprehension: verbalized understanding  HOME EXERCISE PROGRAM: Get post op shoes and appropriate clothing to begin bandaging at next session  ASSESSMENT:  CLINICAL IMPRESSION: Pt is doing well with her own bandaging. She has a bit more pitting at the lower leg compared to last time. We will continue 1x per week to assess maintenance until garments come.  We will submit for flexitouch benefits check today as pt has interest in this still.  See below for flexitouch checklist.      OBJECTIVE IMPAIRMENTS: decreased knowledge of condition, decreased knowledge of use of DME, difficulty walking, increased edema, and pain.   ACTIVITY LIMITATIONS: standing and locomotion level  PARTICIPATION LIMITATIONS: community activity  PERSONAL FACTORS: Fitness and Time since onset of injury/illness/exacerbation are also affecting patient's functional outcome.   REHAB POTENTIAL: Good  CLINICAL DECISION MAKING: Evolving/moderate complexity  EVALUATION COMPLEXITY: Moderate   GOALS: Goals reviewed with patient? Yes  SHORT TERM GOALS: Target date: 03/27/23  Pt will demonstrate a 3 cm decrease in edema at 30 cm superior to floor at lateral malleoli.  Baseline: Goal status: MET   LONG TERM GOALS: Target date: 04/24/23  Pt will be independent in self MLD for long term management of lymphedema.  Baseline:  Goal status: MET  2.  Pt will receive trial of FlexiTouch compression pump for long term management of lymphedema.  Baseline:  Goal status: ONGOING  3.  Pt will obtain appropriate day and night time garments for long term management of lymphedema.  Baseline:  Goal status: ONGOING  4.  Pt will demonstrate maximal circumferential reduction to decrease risk of cellulitis and improve mobility.  Baseline:  Goal status: MOSTLY MET    PLAN:  PT FREQUENCY: 3x/week  PT DURATION: 4 weeks  PLANNED INTERVENTIONS: Therapeutic exercises, Therapeutic activity, Patient/Family  education, Self Care, Orthotic/Fit training, Manual lymph drainage, Compression bandaging, Taping, Vasopneumatic device, Manual therapy, and Re-evaluation  PLAN FOR NEXT SESSION: measure at least on Mondays. send in pump demo and notes around 5/20 if interested, cont CDT to L LE, teach pt bandaging and MLD as able.    Idamae Lusher, PT 04/12/2023, 12:01 PM    DIAGNOSIS ASSESSMENT [x]  Lymphedema, not elsewhere classified [I89.0]  Secondary to: Uterine cancer treatment  []  Post-mastectomy lymphedema [I97.2]   []  Hereditary/Congenital lymphedema [Q82.0]  []  Praecox []  Tarda []  Other:   LOCATION OF LYMPHEDEMA []   Right Arm  []  Left Arm  []  Chest/Breast  []  Abdomen/Trunk [x]  Other: Lt Lower extremity lymphedema  Date of onset, or duration:   SEVERITY Symptoms and observations from physical exam Stage of Lymphedema:  [x] Stage II  []  Stage III  [] Other: []  Hyperkeratosis []  Hyperpigmentation []  Lymphorrhea (weeping) []  Papillomatosis (warts, nodules, papules)  []  Elephantiasis  [x]  Fibrosis and/or radiation fibrosis [x]  Pitting edema []  Recurrent episode(s) of infection/cellulitis []  Pain []  Peau d'orange []  Chest and/or axillary swelling caused by: []  History of chest/breast/abdominal surgery(ies) []  Lymph node dissection, radiation and or chemotherapy []  Truncal and/or abdominal swelling []  Axillary cording [x]  Other, explain: lymphedema in the suprapubic area - improved currently but could worsen without pump coverage   CONSERVATIVE TREATMENT (CT) INITATION Start date (if different from this visit date):02/27/23  Conservative treatment plan includes  the following activities (check all that apply): [x]  Regular exercise  [x]  Elevation of the limb(s) [x]  Compression garment(s) or compression bandage system  []  Compression garments with a minimum of             mmHg distally  []  Compression bra and/or tank top [x]  Compression bandage system  Type: [x]  Multi-layer short  stretch []  Velcro reduction kit    []  Two- or three-layer prepackaged compression kit   []  Other:  [x]  Complete decongestive therapy (CDT) or lymphedema therapy   [x]  Minimum of 30 minutes daily MLD or self-MLD  [x]  Diet assessment with modification, if applicable. []  For patients with congestive heart failure (CHF) a medication assessment with modification(s), if applicable. []  Other:   Has the patient tried an V4098 basic pneumatic compression device? []  Yes  [x]  No Comments:   MEASUREMENTS Choose at least two anatomical locations. Abdominal measurement at umbilicus: 105.5 cm See above for other leg measurements

## 2023-04-15 ENCOUNTER — Telehealth: Payer: Self-pay | Admitting: Pharmacist

## 2023-04-15 ENCOUNTER — Encounter: Payer: Medicare Other | Admitting: Rehabilitation

## 2023-04-15 NOTE — Progress Notes (Unsigned)
Care Management & Coordination Services Pharmacy Team  Reason for Encounter: Appointment Reminder  Contacted patient to confirm {visittype:27222} appointment with ***, PharmD on *** at ***. {US [facility] Outreach:28874}  Do you have any problems getting your medications? {yes/no:20286} If yes what types of problems are you experiencing? {Problems:27223}  What is your top health concern you would like to discuss at your upcoming visit?   Have you seen any other providers since your last visit with PCP? {yes/no:20286}   Chart review:  Recent office visits:  ***  Recent consult visits:  ***  Hospital visits:  {Hospital DC Yes/No:[id number]}   Star Rating Drugs: *** Medication:  Last Fill: Day Supply   Care Gaps: Annual wellness visit in last year? {yes/no:20286}  If Diabetic: Last eye exam / retinopathy screening: Last diabetic foot exam:   Future Appointments  Date Time Provider Department Center  04/16/2023  3:00 PM Erroll Luna, [facility] None  04/19/2023 11:00 AM Tevis, Julieanne Manson, PT [facility] None  04/26/2023 11:00 AM Tevis, Julieanne Manson, PT [facility] None  05/17/2023  9:00 AM Parke Poisson, MD [facility] None  06/03/2023  3:00 PM Gearldine Bienenstock, PA-C [facility] None   April D Calhoun, [facility] Clinical Pharmacist Assistant [phone]

## 2023-04-16 ENCOUNTER — Encounter: Payer: Medicare Other | Admitting: Pharmacist

## 2023-04-17 ENCOUNTER — Encounter: Payer: Medicare Other | Admitting: Rehabilitation

## 2023-04-19 ENCOUNTER — Ambulatory Visit: Payer: Medicare Other | Attending: Family Medicine | Admitting: Rehabilitation

## 2023-04-19 ENCOUNTER — Encounter: Payer: Self-pay | Admitting: Rehabilitation

## 2023-04-19 DIAGNOSIS — I890 Lymphedema, not elsewhere classified: Secondary | ICD-10-CM

## 2023-04-19 DIAGNOSIS — R262 Difficulty in walking, not elsewhere classified: Secondary | ICD-10-CM

## 2023-04-19 NOTE — Therapy (Signed)
OUTPATIENT PHYSICAL THERAPY LOWER EXTREMITY LYMPHEDEMA TREATMENT  Patient Name: Savannah [name] MRN: [id number] DOB:Jul 22, 1956, 67 y.o., female Today's Date: 04/19/2023  END OF SESSION:  PT End of Session - 04/19/23 1059     Visit Number 13    Number of Visits 25    Date for PT Re-Evaluation 04/24/23    PT Start Time 1100    PT Stop Time 1156    PT Time Calculation (min) 56 min    Activity Tolerance Patient tolerated treatment well    Behavior During Therapy WFL for tasks assessed/performed                 Past Medical History:  Diagnosis Date   ABDOMINAL PAIN, CHRONIC 10/20/2009   Acute cystitis 08/17/2009   ALLERGIC RHINITIS 11/21/2007   Allergy    ASYMPTOMATIC POSTMENOPAUSAL STATUS 09/29/2008   Bariatric surgery status 07/07/2010   Bariatric surgery status 07/07/2010   Qualifier: Diagnosis of  By: Everardo All MD, Sean A    Cancer [facility])    uterine   Cataract    Coronary artery disease    DEPRESSION 11/21/2007   DIABETES MELLITUS, TYPE II 07/08/2007   DYSPNEA 05/20/2009   Edema 05/20/2009   Eosinophilic esophagitis 02/13/2008   FEVER UNSPECIFIED 10/20/2009   FEVER, HX OF 03/09/2010   GERD (gastroesophageal reflux disease)    Headache(784.0) 02/06/2010   Hepatomegaly 01/06/2008   HYPERLIPIDEMIA 07/08/2007   HYPERTENSION 07/08/2007   LEUKOPENIA, MILD 09/29/2008   Melanoma in situ of left upper arm (HCC) 04/03/2023   OBESITY 01/06/2008   OSTEOARTHRITIS 01/06/2008   Other chronic nonalcoholic liver disease 01/06/2008   PERIPHERAL NEUROPATHY 11/21/2007   Peripheral neuropathy    Postsurgical hypothyroidism 09/19/2010   SINUSITIS- ACUTE-NOS 11/21/2007   TB SKIN TEST, POSITIVE 02/06/2010   THYROID NODULE 03/09/2010   Past Surgical History:  Procedure Laterality Date   ABDOMINAL HYSTERECTOMY     BASAL CELL CARCINOMA EXCISION     CARDIAC CATHETERIZATION     CHOLECYSTECTOMY N/A 05/11/2021   Procedure: LAPAROSCOPIC CHOLECYSTECTOMY WITH POSSIBLE  INTRAOPERATIVE  CHOLANGIOGRAM;  Surgeon: Luretha Murphy, MD;  Location: [facility];  Service: General;  Laterality: N/A;   COLONOSCOPY     CORONARY ATHERECTOMY N/A 05/03/2022   Procedure: CORONARY ATHERECTOMY;  Surgeon: Orbie Pyo, MD;  Location: [facility];  Service: Cardiovascular;  Laterality: N/A;   CORONARY IMAGING/OCT N/A 05/03/2022   Procedure: INTRAVASCULAR IMAGING/OCT;  Surgeon: Orbie Pyo, MD;  Location: [facility];  Service: Cardiovascular;  Laterality: N/A;   CORONARY PRESSURE/FFR STUDY N/A 05/03/2022   Procedure: INTRAVASCULAR PRESSURE WIRE/FFR STUDY;  Surgeon: Orbie Pyo, MD;  Location: [facility];  Service: Cardiovascular;  Laterality: N/A;   CORONARY STENT INTERVENTION N/A 05/03/2022   Procedure: CORONARY STENT INTERVENTION;  Surgeon: Orbie Pyo, MD;  Location: [facility];  Service: Cardiovascular;  Laterality: N/A;  lad   EYE SURGERY Bilateral 08/2018, 09/2018   FOOT SURGERY Left    10/2019   KNEE ARTHROSCOPY Left    LEFT HEART CATH AND CORONARY ANGIOGRAPHY N/A 01/20/2019   Procedure: LEFT HEART CATH AND CORONARY ANGIOGRAPHY;  Surgeon: Lyn Records, MD;  Location: [facility];  Service: Cardiovascular;  Laterality: N/A;   LYMPH NODE DISSECTION     OOPHORECTOMY     Right total knee replacement     RIGHT/LEFT HEART CATH AND CORONARY ANGIOGRAPHY N/A 05/03/2022   Procedure: RIGHT/LEFT HEART CATH AND CORONARY ANGIOGRAPHY;  Surgeon: Alverda Skeans  K, MD;  Location: [facility];  Service: Cardiovascular;  Laterality: N/A;   ROUX-EN-Y GASTRIC BYPASS  2011   SKIN TAG REMOVAL     THYROIDECTOMY     TONSILLECTOMY AND ADENOIDECTOMY     TOOTH EXTRACTION  2023   UPPER GASTROINTESTINAL ENDOSCOPY     Patient Active Problem List   Diagnosis Date Noted   Bilateral carpal tunnel syndrome 03/13/2022   Leg edema 03/01/2022   Nondisplaced comminuted fracture of left patella, initial encounter for closed fracture 08/01/2021   Arthritis of  carpometacarpal (CMC) joint of right thumb 07/28/2021   Primary osteoarthritis of right distal radioulnar joint 07/28/2021   Arthritis of wrist, right, degenerative 07/28/2021   History of malignant neoplasm of uterine body 07/27/2021   Osteopenia 01/14/2021   Sesamoiditis of left foot 11/09/2020   Posterior tibial tendinitis of left lower extremity 11/09/2020   Type 2 diabetes mellitus with diabetic peripheral angiopathy without gangrene, with long-term current use of insulin (HCC) 10/21/2020   Iron deficiency anemia 03/14/2020   Lesion of vulva 09/02/2019   Menorrhagia 09/02/2019   Neuropathy 09/02/2019   Irregular bowel habits 06/25/2019   Coronary artery disease involving native coronary artery of native heart without angina pectoris    Temporomandibular joint disorder 12/05/2018   Fatigue 12/05/2018   Inverted nipple 12/05/2018   Senile purpura (HCC) 11/21/2018   GERD with esophagitis 11/21/2018   Pseudogout involving multiple joints 11/21/2018   Lipoma 07/22/2017   Lichen sclerosus 11/16/2016   H/O gastric bypass 11/16/2016   Depression 11/16/2016   Diabetic polyneuropathy associated with type 2 diabetes mellitus (HCC) 11/16/2016   Uterine cancer (HCC) s/p hysterectomy 1997 01/04/2012   Postsurgical hypothyroidism 09/19/2010   Diabetic neuropathy (HCC) 11/21/2007   Allergic rhinitis 11/21/2007   Dyslipidemia associated with type 2 diabetes mellitus (HCC) 07/08/2007    PCP: [name], MD  REFERRING PROVIDER: Ardith Dark, MD  REFERRING DIAG: R60.0 (ICD-10-CM) - Leg edema  THERAPY DIAG:  Lymphedema, not elsewhere classified  Difficulty in walking, not elsewhere classified  Rationale for Evaluation and Treatment: Rehabilitation  ONSET DATE: Prior to 1997  SUBJECTIVE:                                                                                                                                                                                            SUBJECTIVE STATEMENT:  I didn't wrap it for my nephews confirmation and I could tell it got bigger quickly.    EVAL: I have been having swelling since before 1997 that was mostly in my ankles. Then after my surgery for uterine cancer it worsened on  the L leg. I take lasix but it has minimal effect. I have never had lymphedema therapy. I had prescription strength thigh highs but they did not help.   PERTINENT HISTORY:  Uterine cancer in 1997 with lymph node removal (pt reports the surgeon told her he got all the lymph nodes he could find bilaterally), pt reports her vascular doctor told her the left leg edema was vascular, diabetic, has hx of cardiac stent   PAIN:  Are you having pain? No  PRECAUTIONS: Other: R Knee TKA  WEIGHT BEARING RESTRICTIONS: No  FALLS:  Has patient fallen in last 6 months? Yes. Number of falls 3 or 4 Falls due to feeling unsteady, has neuropathy, BP has been an issue - low  PATIENT GOALS: to get the swelling down   OBJECTIVE:  SENSATION: pt reports neuropathy in bilateral feet  LYMPHEDEMA ASSESSMENTS:   LE LANDMARK RIGHT EVAL  At groin   30 cm proximal to suprapatella   20 cm proximal to suprapatella 47  10 cm proximal to suprapatella 40.3  At midpatella / popliteal crease 39  30 cm proximal to floor at lateral plantar foot 32.1  20 cm proximal to floor at lateral plantar foot 26.1  10 cm proximal to floor at lateral plantar foot 25.5  Circumference of ankle/heel   5 cm proximal to 1st MTP joint 22.7  Across MTP joint 24  Around proximal great toe 8  (Blank rows = not tested)  LE LANDMARK LEFT EVAL 03/18/23 03/27/23 04/05/23  At groin      30 cm proximal to suprapatella      20 cm proximal to suprapatella 46 47 44.5   10 cm proximal to suprapatella 41.2 43 41 41  At midpatella / popliteal crease 40.3 40.3 39.5 40  30 cm proximal to floor at lateral plantar foot 37.5 37.7 32 32.3  20 cm proximal to floor at lateral plantar foot 30.3 31.2 28.1  27.5  10 cm proximal to floor at lateral plantar foot 28.1 27.6 28 26.5  Circumference of ankle/heel      5 cm proximal to 1st MTP joint 25 24.7 23.5 24  Across MTP joint 23.4 24 23.3 23.3  Around proximal great toe 8.7 8.7 8.9 8.6  (Blank rows = not tested)   TODAY'S TREATMENT:                                                                                                                                         DATE:  04/19/23 Manual Therapy  MLD to Lt LE as follows: Supine with bil LE's on bolster: Short neck, superficial and deep abdominals, Left axillary nodes, Lt inguino-axillary anastomosis, then Lt LE working proximal to distal and then back proximally reversing all steps.  Focused on areas of increased swelling at the foot and ankle.  Compression Bandaging to Lt LE as follows: (knee high only) Cocoa  butter to Lt LE including foot/ankle, TG soft x1 from foot to knee, Elastomull to toes 1-4, Artiflex to foot/ankle, and rosidal soft to knee, then 6 cm in Roman sandal, then 8 cm ASH, 2 - 10 cm from ankle to knee,   04/12/23 Manual Therapy  MLD to Lt LE as follows: Supine with bil LE's on bolster: Short neck, superficial and deep abdominals, Left axillary nodes, Lt inguino-axillary anastomosis, then Lt LE working proximal to distal and then back proximally reversing all steps.  Focused on areas of increased swelling at the foot and ankle.  Compression Bandaging to Lt LE as follows: (knee high only) Cocoa butter to Lt LE including foot/ankle, TG soft x1 from foot to knee, Elastomull to toes 1-4, Artiflex to foot/ankle, and rosidal soft to knee, then 6 cm in Roman sandal, then 8 cm ASH, 2 - 10 cm from ankle to knee,   04/05/23 Manual Therapy  Remeasured LE MLD to Lt LE as follows: Supine with bil LE's on bolster: Short neck, superficial and deep abdominals, Left axillary nodes, Lt inguino-axillary anastomosis, then Lt LE working proximal to distal and then back proximally reversing all steps.   Focused on areas of increased swelling at the foot and ankle.   03/29/23: Manual Therapy  MLD to Lt LE as follows: Supine with bil LE's on bolster: Short neck, superficial and deep abdominals, Left axillary nodes, Lt inguino-axillary anastomosis, then Lt LE working proximal to distal and then back proximally reversing all steps.  Focused on areas of increased swelling at the foot and ankle.  Compression Bandaging to Lt LE as follows: (knee high only) Cocoa butter to Lt LE including foot/ankle, TG soft x1 from foot to knee, Elastomull to toes 1-4, Artiflex to foot/ankle, and rosidal soft to knee, then 6 cm in Roman sandal, then 8 cm ASH, 2 - 10 cm from ankle to knee,  Pt will remove for pedicure and then will rewrap her own leg later.     03/27/23: Manual Therapy  MLD to Lt LE as follows: Supine with bil LE's on bolster: Short neck, superficial and deep abdominals, Left axillary nodes, Lt inguino-axillary anastomosis, then Lt LE working proximal to distal and then back proximally reversing all steps.  Focused on areas of increased swelling at the foot and ankle.  Was measured by [facility]idy for compression garments today. She will get a flat knit 20-30 class 2 thigh high medi cosy with sensitive or silicone free top and open toe due to silicone sensitivity and with knee flexure zone due to sensitivity at the back of the knee when bandaging. We will start with 1 unit of day and night to make sure that it fits well and is comfortable for the patient.  She needs a custom garment because she has a sensitivity to silicone which is the only material available  in off the shelf.  She has stage 3 lymphedema that has reduced moderately from starting CDT and which fills up moderately quickly when not compressed.  She would also then benefit from a night garment to prevent refill and to decrease lower leg fibrosis which is present from the top of the foot to mid calf currently.  +2 pitting status. She has difficulty  putting on socks with no compression so we will request a donning aid for the stocking as well.   Compression Bandaging to Lt LE as follows: (knee high only) Cocoa butter to Lt LE including foot/ankle, TG soft x1 from foot to knee,  Elastomull to toes 1-4, Artiflex to foot/ankle, and rosidal soft to knee, then 6 cm in Roman sandal, then 8 cm ASH, 2 - 10 cm from ankle to knee,  Pt will remove for pedicure and then will rewrap her own leg later.     03/25/23: Manual Therapy Taught pt self MLD in seated with cueing and instruction for all steps. Then  MLD to Lt LE as follows: Supine with bil LE's on bolster: Short neck, superficial and deep abdominals, Left axillary nodes, Lt inguino-axillary anastomosis, then Lt LE working proximal to distal and then back proximally reversing all steps.  Focused on areas of increased swelling at the foot and ankle.  Compression Bandaging to Lt LE as follows: (trial of knee high only) Cocoa butter to Lt LE including foot/ankle, TG soft x1 from foot to knee, Elastomull to toes 1-4, Artiflex to foot/ankle, 1/2" gray compression foam to dorsal foot covering lateral malleolous, and 1/2" gray foam "wagon wheel" to medial malleolous fixated with 6 cm in Roman sandal, then 8 cm ASH, 2 - 10 cm from ankle to knee,  Pt knows to remove if thigh gets more swollen and to incorporate some MLD proximally to help.   03/22/23: Manual Therapy MLD to Lt LE as follows: Supine with bil LE's on bolster: Short neck, superficial and deep abdominals, Left axillary nodes, Lt inguino-axillary anastomosis, then Lt LE working proximal to distal and then back proximally reversing all steps.  Focused on areas of increased swelling at the foot and ankle.  Compression Bandaging to Lt LE as follows: Cocoa butter to Lt LE including foot/ankle, TG soft x1 from foot to thigh, Elastomull to toes 1-4, Artiflex to foot/ankle, 1/2" gray compression foam to dorsal foot covering lateral malleolous, and 1/2" gray foam  "wagon wheel" to medial malleolous fixated with 6 cm in Roman sandal, then 8 cm ASH, 2 - 10 cm from ankle to knee, first spiral, next herring bone, then 2 - 12 cm, first starting below knee with "X" at popliteal fossa and next start at knee and spiral to near groin.   03/20/23: Manual Therapy MLD to Lt LE as follows: Supine with bil LE's on bolster: Short neck, superficial and deep abdominals, Left axillary nodes, Lt inguino-axillary anastomosis, then Lt LE working proximal to distal and then back proximally reversing all steps.  Focused on areas of increased swelling at the foot and ankle.  Compression Bandaging to Lt LE as follows: Cocoa butter to Lt LE including foot/ankle, TG soft x1 from foot to thigh, Elastomull to toes 1-4, Artiflex to foot/ankle, 1/2" gray compression foam to dorsal foot covering lateral malleolous, and 1/2" gray foam "wagon wheel" to medial malleolous fixated with 6 cm in Roman sandal, then 8 cm ASH, 2 - 10 cm from ankle to knee, first spiral, next herring bone, then 2 - 12 cm, first starting below knee with "X" at popliteal fossa and next start at knee and spiral to near groin.   03/18/23 Remeasured leg Started education on self bandaging. Gave link to MD anderson bandaging video. Verbal instruction, demo, and cueing to complete bandage.  Pt is able to complete the foot part in seated with the leg crossed over at the ankle and was able to reach toes 1-3.  Applied all bandages successfully with cueing and education.  Gave pt new set of bandages and new tg soft. Then PT reapplied bandages and donned cast shoe.   Pt will apply bandages each morning after showering Pt may  be interested in velcro  03/15/23 Supine legs on wedge: MLD: Left axillary nodes, Lt inguinal nodes, superficial and deep abdominals with breath, Lt inguinal-axillary anastomosis, then Lt LE working proximal to distal and then back proximally reversing all steps.  Focused on areas of increased swelling at the foot and  ankle.   Large tg soft thigh, medium tg soft to lower leg, toes 1-4 with mollelast, rectangle rosidal soft and artiflex for foot and ankle shaping, then rosidal soft from ankle to knee and then knee to thigh, 1 8cm roman sandal, 1 10cm ASH, 1 10cm from ankle to knee, 1 12cm from below then knee to the hip and then another 12cm from below the knee to the hip.       PATIENT EDUCATION:  Education details: anatomy and physiology of the lymphatic system, need for day and night time compression garments, compression bandaging vs compression garments, venous insufficiency vs lymphedema Person educated: Patient Education method: Explanation Education comprehension: verbalized understanding  HOME EXERCISE PROGRAM: Get post op shoes and appropriate clothing to begin bandaging at next session  ASSESSMENT:  CLINICAL IMPRESSION: Pt is doing well with her own bandaging. She continues with pitting at the lower leg.  We will continue 1x per week to assess maintenance until garments come.  Updated flexitouch note to include pelvic hx.     OBJECTIVE IMPAIRMENTS: decreased knowledge of condition, decreased knowledge of use of DME, difficulty walking, increased edema, and pain.   ACTIVITY LIMITATIONS: standing and locomotion level  PARTICIPATION LIMITATIONS: community activity  PERSONAL FACTORS: Fitness and Time since onset of injury/illness/exacerbation are also affecting patient's functional outcome.   REHAB POTENTIAL: Good  CLINICAL DECISION MAKING: Evolving/moderate complexity  EVALUATION COMPLEXITY: Moderate   GOALS: Goals reviewed with patient? Yes  SHORT TERM GOALS: Target date: 03/27/23  Pt will demonstrate a 3 cm decrease in edema at 30 cm superior to floor at lateral malleoli.  Baseline: Goal status: MET   LONG TERM GOALS: Target date: 04/24/23  Pt will be independent in self MLD for long term management of lymphedema.  Baseline:  Goal status: MET  2.  Pt will receive trial of  FlexiTouch compression pump for long term management of lymphedema.  Baseline:  Goal status: ONGOING  3.  Pt will obtain appropriate day and night time garments for long term management of lymphedema.  Baseline:  Goal status: ONGOING  4.  Pt will demonstrate maximal circumferential reduction to decrease risk of cellulitis and improve mobility.  Baseline:  Goal status: MOSTLY MET    PLAN:  PT FREQUENCY: 3x/week  PT DURATION: 4 weeks  PLANNED INTERVENTIONS: Therapeutic exercises, Therapeutic activity, Patient/Family education, Self Care, Orthotic/Fit training, Manual lymph drainage, Compression bandaging, Taping, Vasopneumatic device, Manual therapy, and Re-evaluation  PLAN FOR NEXT SESSION: measure at least on Mondays. send in pump demo and notes around 5/20 if interested, cont CDT to L LE, teach pt bandaging and MLD as able.    Idamae Lusher, PT 04/19/2023, 11:57 AM    DIAGNOSIS ASSESSMENT [x]  Lymphedema, not elsewhere classified [I89.0]  Secondary to: Uterine cancer treatment  []  Post-mastectomy lymphedema [I97.2]   []  Hereditary/Congenital lymphedema [Q82.0]  []  Praecox []  Tarda []  Other:   LOCATION OF LYMPHEDEMA []   Right Arm  []  Left Arm  []  Chest/Breast  []  Abdomen/Trunk [x]  Other: Lt Lower extremity lymphedema, Lt vulva and suprapubic Date of onset, or duration: 1997  SEVERITY Symptoms and observations from physical exam Stage of Lymphedema:  [x] Stage II  []  Stage  III  [] Other: []  Hyperkeratosis []  Hyperpigmentation []  Lymphorrhea (weeping) []  Papillomatosis (warts, nodules, papules)  []  Elephantiasis  [x]  Fibrosis and/or radiation fibrosis [x]  Pitting edema []  Recurrent episode(s) of infection/cellulitis []  Pain []  Peau d'orange []  Chest and/or axillary swelling caused by: []  History of chest/breast/abdominal surgery(ies) []  Lymph node dissection, radiation and or chemotherapy []  Truncal and/or abdominal swelling []  Axillary cording [x]  Other, explain:  lymphedema in the suprapubic area - with history of lymph node removal x 2 from on the Lt vulva in 2007 and 2010 by Dr. Ellyn Hack -  could worsen without advanced pump coverage   CONSERVATIVE TREATMENT (CT) INITATION Start date (if different from this visit date):02/27/23  Conservative treatment plan includes the following activities (check all that apply): [x]  Regular exercise  [x]  Elevation of the limb(s) [x]  Compression garment(s) or compression bandage system  []  Compression garments with a minimum of             mmHg distally  []  Compression bra and/or tank top [x]  Compression bandage system  Type: [x]  Multi-layer short stretch []  Velcro reduction kit    []  Two- or three-layer prepackaged compression kit   []  Other:  [x]  Complete decongestive therapy (CDT) or lymphedema therapy   [x]  Minimum of 30 minutes daily MLD or self-MLD  [x]  Diet assessment with modification, if applicable. []  For patients with congestive heart failure (CHF) a medication assessment with modification(s), if applicable. []  Other:   Has the patient tried an [id number] basic pneumatic compression device? []  Yes  [x]  No Comments:   MEASUREMENTS Abdominal measurement at umbilicus: 105.5 cm See above for other leg measurements

## 2023-04-22 ENCOUNTER — Encounter: Payer: Medicare Other | Admitting: Rehabilitation

## 2023-04-24 ENCOUNTER — Encounter: Payer: Medicare Other | Admitting: Rehabilitation

## 2023-04-26 ENCOUNTER — Encounter: Payer: Self-pay | Admitting: Rehabilitation

## 2023-04-26 ENCOUNTER — Ambulatory Visit: Payer: Medicare Other | Admitting: Rehabilitation

## 2023-04-26 DIAGNOSIS — I890 Lymphedema, not elsewhere classified: Secondary | ICD-10-CM | POA: Diagnosis not present

## 2023-04-26 DIAGNOSIS — R262 Difficulty in walking, not elsewhere classified: Secondary | ICD-10-CM | POA: Diagnosis not present

## 2023-04-26 NOTE — Therapy (Signed)
OUTPATIENT PHYSICAL THERAPY LOWER EXTREMITY LYMPHEDEMA TREATMENT  Patient Name: Savannah [name] MRN: [id number] DOB:07-Mar-1956, 67 y.o., female Today's Date: 04/26/2023  END OF SESSION:  PT End of Session - 04/26/23 1100     Visit Number 14    Number of Visits 25    Date for PT Re-Evaluation 06/07/23    PT Start Time 1100    PT Stop Time 1155    PT Time Calculation (min) 55 min    Activity Tolerance Patient tolerated treatment well    Behavior During Therapy WFL for tasks assessed/performed                 Past Medical History:  Diagnosis Date   ABDOMINAL PAIN, CHRONIC 10/20/2009   Acute cystitis 08/17/2009   ALLERGIC RHINITIS 11/21/2007   Allergy    ASYMPTOMATIC POSTMENOPAUSAL STATUS 09/29/2008   Bariatric surgery status 07/07/2010   Bariatric surgery status 07/07/2010   Qualifier: Diagnosis of  By: Everardo All MD, Sean A    Cancer [facility])    uterine   Cataract    Coronary artery disease    DEPRESSION 11/21/2007   DIABETES MELLITUS, TYPE II 07/08/2007   DYSPNEA 05/20/2009   Edema 05/20/2009   Eosinophilic esophagitis 02/13/2008   FEVER UNSPECIFIED 10/20/2009   FEVER, HX OF 03/09/2010   GERD (gastroesophageal reflux disease)    Headache(784.0) 02/06/2010   Hepatomegaly 01/06/2008   HYPERLIPIDEMIA 07/08/2007   HYPERTENSION 07/08/2007   LEUKOPENIA, MILD 09/29/2008   Melanoma in situ of left upper arm (HCC) 04/03/2023   OBESITY 01/06/2008   OSTEOARTHRITIS 01/06/2008   Other chronic nonalcoholic liver disease 01/06/2008   PERIPHERAL NEUROPATHY 11/21/2007   Peripheral neuropathy    Postsurgical hypothyroidism 09/19/2010   SINUSITIS- ACUTE-NOS 11/21/2007   TB SKIN TEST, POSITIVE 02/06/2010   THYROID NODULE 03/09/2010   Past Surgical History:  Procedure Laterality Date   ABDOMINAL HYSTERECTOMY     BASAL CELL CARCINOMA EXCISION     CARDIAC CATHETERIZATION     CHOLECYSTECTOMY N/A 05/11/2021   Procedure: LAPAROSCOPIC CHOLECYSTECTOMY WITH POSSIBLE  INTRAOPERATIVE  CHOLANGIOGRAM;  Surgeon: Luretha Murphy, MD;  Location: [facility];  Service: General;  Laterality: N/A;   COLONOSCOPY     CORONARY ATHERECTOMY N/A 05/03/2022   Procedure: CORONARY ATHERECTOMY;  Surgeon: Orbie Pyo, MD;  Location: [facility];  Service: Cardiovascular;  Laterality: N/A;   CORONARY IMAGING/OCT N/A 05/03/2022   Procedure: INTRAVASCULAR IMAGING/OCT;  Surgeon: Orbie Pyo, MD;  Location: [facility];  Service: Cardiovascular;  Laterality: N/A;   CORONARY PRESSURE/FFR STUDY N/A 05/03/2022   Procedure: INTRAVASCULAR PRESSURE WIRE/FFR STUDY;  Surgeon: Orbie Pyo, MD;  Location: [facility];  Service: Cardiovascular;  Laterality: N/A;   CORONARY STENT INTERVENTION N/A 05/03/2022   Procedure: CORONARY STENT INTERVENTION;  Surgeon: Orbie Pyo, MD;  Location: [facility];  Service: Cardiovascular;  Laterality: N/A;  lad   EYE SURGERY Bilateral 08/2018, 09/2018   FOOT SURGERY Left    10/2019   KNEE ARTHROSCOPY Left    LEFT HEART CATH AND CORONARY ANGIOGRAPHY N/A 01/20/2019   Procedure: LEFT HEART CATH AND CORONARY ANGIOGRAPHY;  Surgeon: Lyn Records, MD;  Location: [facility];  Service: Cardiovascular;  Laterality: N/A;   LYMPH NODE DISSECTION     OOPHORECTOMY     Right total knee replacement     RIGHT/LEFT HEART CATH AND CORONARY ANGIOGRAPHY N/A 05/03/2022   Procedure: RIGHT/LEFT HEART CATH AND CORONARY ANGIOGRAPHY;  Surgeon: Alverda Skeans  K, MD;  Location: [facility];  Service: Cardiovascular;  Laterality: N/A;   ROUX-EN-Y GASTRIC BYPASS  2011   SKIN TAG REMOVAL     THYROIDECTOMY     TONSILLECTOMY AND ADENOIDECTOMY     TOOTH EXTRACTION  2023   UPPER GASTROINTESTINAL ENDOSCOPY     Patient Active Problem List   Diagnosis Date Noted   Bilateral carpal tunnel syndrome 03/13/2022   Leg edema 03/01/2022   Nondisplaced comminuted fracture of left patella, initial encounter for closed fracture 08/01/2021   Arthritis of  carpometacarpal (CMC) joint of right thumb 07/28/2021   Primary osteoarthritis of right distal radioulnar joint 07/28/2021   Arthritis of wrist, right, degenerative 07/28/2021   History of malignant neoplasm of uterine body 07/27/2021   Osteopenia 01/14/2021   Sesamoiditis of left foot 11/09/2020   Posterior tibial tendinitis of left lower extremity 11/09/2020   Type 2 diabetes mellitus with diabetic peripheral angiopathy without gangrene, with long-term current use of insulin (HCC) 10/21/2020   Iron deficiency anemia 03/14/2020   Lesion of vulva 09/02/2019   Menorrhagia 09/02/2019   Neuropathy 09/02/2019   Irregular bowel habits 06/25/2019   Coronary artery disease involving native coronary artery of native heart without angina pectoris    Temporomandibular joint disorder 12/05/2018   Fatigue 12/05/2018   Inverted nipple 12/05/2018   Senile purpura (HCC) 11/21/2018   GERD with esophagitis 11/21/2018   Pseudogout involving multiple joints 11/21/2018   Lipoma 07/22/2017   Lichen sclerosus 11/16/2016   H/O gastric bypass 11/16/2016   Depression 11/16/2016   Diabetic polyneuropathy associated with type 2 diabetes mellitus (HCC) 11/16/2016   Uterine cancer (HCC) s/p hysterectomy 1997 01/04/2012   Postsurgical hypothyroidism 09/19/2010   Diabetic neuropathy (HCC) 11/21/2007   Allergic rhinitis 11/21/2007   Dyslipidemia associated with type 2 diabetes mellitus (HCC) 07/08/2007    PCP: [name], MD  REFERRING PROVIDER: Ardith Dark, MD  REFERRING DIAG: R60.0 (ICD-10-CM) - Leg edema  THERAPY DIAG:  Lymphedema, not elsewhere classified  Difficulty in walking, not elsewhere classified  Rationale for Evaluation and Treatment: Rehabilitation  ONSET DATE: Prior to 1997  SUBJECTIVE:                                                                                                                                                                                            SUBJECTIVE STATEMENT:  I can't take that boot.  I have a new one on order.    EVAL: I have been having swelling since before 1997 that was mostly in my ankles. Then after my surgery for uterine cancer it worsened on the L leg.  I take lasix but it has minimal effect. I have never had lymphedema therapy. I had prescription strength thigh highs but they did not help.   PERTINENT HISTORY:  Uterine cancer in 1997 with lymph node removal (pt reports the surgeon told her he got all the lymph nodes he could find bilaterally), pt reports her vascular doctor told her the left leg edema was vascular, diabetic, has hx of cardiac stent   PAIN:  Are you having pain? No  PRECAUTIONS: Other: R Knee TKA  WEIGHT BEARING RESTRICTIONS: No  FALLS:  Has patient fallen in last 6 months? Yes. Number of falls 3 or 4 Falls due to feeling unsteady, has neuropathy, BP has been an issue - low  PATIENT GOALS: to get the swelling down   OBJECTIVE:  SENSATION: pt reports neuropathy in bilateral feet  LYMPHEDEMA ASSESSMENTS:   LE LANDMARK RIGHT EVAL  At groin   30 cm proximal to suprapatella   20 cm proximal to suprapatella 47  10 cm proximal to suprapatella 40.3  At midpatella / popliteal crease 39  30 cm proximal to floor at lateral plantar foot 32.1  20 cm proximal to floor at lateral plantar foot 26.1  10 cm proximal to floor at lateral plantar foot 25.5  Circumference of ankle/heel   5 cm proximal to 1st MTP joint 22.7  Across MTP joint 24  Around proximal great toe 8  (Blank rows = not tested)  LE LANDMARK LEFT EVAL 03/18/23 03/27/23 04/05/23 04/26/23  At groin       30 cm proximal to suprapatella       20 cm proximal to suprapatella 46 47 44.5    10 cm proximal to suprapatella 41.2 43 41 41   At midpatella / popliteal crease 40.3 40.3 39.5 40   30 cm proximal to floor at lateral plantar foot 37.5 37.7 32 32.3   20 cm proximal to floor at lateral plantar foot 30.3 31.2 28.1 27.5   10 cm  proximal to floor at lateral plantar foot 28.1 27.6 28 26.5   Circumference of ankle/heel       5 cm proximal to 1st MTP joint 25 24.7 23.5 24   Across MTP joint 23.4 24 23.3 23.3   Around proximal great toe 8.7 8.7 8.9 8.6   (Blank rows = not tested)   TODAY'S TREATMENT:                                                                                                                                         DATE:  04/26/23 Manual Therapy  Made adjustments to her current sandal to lengthen the velcro strap so she is able to wear it home and not the cast shoe.  MLD to Lt LE as follows: Supine with bil LE's on bolster: Short neck, superficial and deep abdominals, Left axillary nodes, Lt inguino-axillary anastomosis, then Lt LE  working proximal to distal and then back proximally reversing all steps.  Focused on areas of increased swelling at the foot and ankle.  Compression Bandaging to Lt LE as follows: (knee high only) Cocoa butter to Lt LE including foot/ankle, TG soft x1 from foot to knee, Elastomull to toes 1-4, Artiflex to foot/ankle, and rosidal soft to knee, then 6 cm in Roman sandal, then 8 cm ASH, 2 - 10 cm from ankle to knee,   04/19/23 Manual Therapy  MLD to Lt LE as follows: Supine with bil LE's on bolster: Short neck, superficial and deep abdominals, Left axillary nodes, Lt inguino-axillary anastomosis, then Lt LE working proximal to distal and then back proximally reversing all steps.  Focused on areas of increased swelling at the foot and ankle.  Compression Bandaging to Lt LE as follows: (knee high only) Cocoa butter to Lt LE including foot/ankle, TG soft x1 from foot to knee, Elastomull to toes 1-4, Artiflex to foot/ankle, and rosidal soft to knee, then 6 cm in Roman sandal, then 8 cm ASH, 2 - 10 cm from ankle to knee,   04/12/23 Manual Therapy  MLD to Lt LE as follows: Supine with bil LE's on bolster: Short neck, superficial and deep abdominals, Left axillary nodes, Lt  inguino-axillary anastomosis, then Lt LE working proximal to distal and then back proximally reversing all steps.  Focused on areas of increased swelling at the foot and ankle.  Compression Bandaging to Lt LE as follows: (knee high only) Cocoa butter to Lt LE including foot/ankle, TG soft x1 from foot to knee, Elastomull to toes 1-4, Artiflex to foot/ankle, and rosidal soft to knee, then 6 cm in Roman sandal, then 8 cm ASH, 2 - 10 cm from ankle to knee,   PATIENT EDUCATION:  Education details: anatomy and physiology of the lymphatic system, need for day and night time compression garments, compression bandaging vs compression garments, venous insufficiency vs lymphedema Person educated: Patient Education method: Explanation Education comprehension: verbalized understanding  HOME EXERCISE PROGRAM: Get post op shoes and appropriate clothing to begin bandaging at next session  ASSESSMENT:  CLINICAL IMPRESSION: Pt is doing well with her own bandaging. She continues with pitting at the lower leg and foot.  We will continue 1x per week to assess maintenance until garments come.  Updated flexitouch note to include pelvic hx.     OBJECTIVE IMPAIRMENTS: decreased knowledge of condition, decreased knowledge of use of DME, difficulty walking, increased edema, and pain.   ACTIVITY LIMITATIONS: standing and locomotion level  PARTICIPATION LIMITATIONS: community activity  PERSONAL FACTORS: Fitness and Time since onset of injury/illness/exacerbation are also affecting patient's functional outcome.   REHAB POTENTIAL: Good  CLINICAL DECISION MAKING: Evolving/moderate complexity  EVALUATION COMPLEXITY: Moderate   GOALS: Goals reviewed with patient? Yes  SHORT TERM GOALS: Target date: 03/27/23  Pt will demonstrate a 3 cm decrease in edema at 30 cm superior to floor at lateral malleoli.  Baseline: Goal status: MET   LONG TERM GOALS: Target date: 04/24/23  Pt will be independent in self MLD for  long term management of lymphedema.  Baseline:  Goal status: MET  2.  Pt will receive trial of FlexiTouch compression pump for long term management of lymphedema.  Baseline:  Goal status: ONGOING  3.  Pt will obtain appropriate day and night time garments for long term management of lymphedema.  Baseline:  Goal status: ONGOING  4.  Pt will demonstrate maximal circumferential reduction to decrease risk of cellulitis and improve  mobility.  Baseline:  Goal status: MOSTLY MET    PLAN:  PT FREQUENCY: 3x/week  PT DURATION: 4 weeks  PLANNED INTERVENTIONS: Therapeutic exercises, Therapeutic activity, Patient/Family education, Self Care, Orthotic/Fit training, Manual lymph drainage, Compression bandaging, Taping, Vasopneumatic device, Manual therapy, and Re-evaluation  PLAN FOR NEXT SESSION: measure next visit. send in pump demo and notes around 5/20 if interested, cont CDT to L LE, teach pt bandaging and MLD as able.    Idamae Lusher, PT 04/26/2023, 12:00 PM    DIAGNOSIS ASSESSMENT [x]  Lymphedema, not elsewhere classified [I89.0]  Secondary to: Uterine cancer treatment  []  Post-mastectomy lymphedema [I97.2]   []  Hereditary/Congenital lymphedema [Q82.0]  []  Praecox []  Tarda []  Other:   LOCATION OF LYMPHEDEMA []   Right Arm  []  Left Arm  []  Chest/Breast  [x]  Abdomen/Trunk [x]  Other: Lt Lower extremity lymphedema, Lt vulva and suprapubic up to umbillicus  Date of onset, or duration: 1997, CDT from 02/27/23 to current  Pt is compliant with therapy   SEVERITY Symptoms and observations from physical exam Stage of Lymphedema:  [x] Stage II  []  Stage III  [] Other: []  Hyperkeratosis []  Hyperpigmentation []  Lymphorrhea (weeping) []  Papillomatosis (warts, nodules, papules)  []  Elephantiasis  [x]  Fibrosis and/or radiation fibrosis [x]  Pitting edema []  Recurrent episode(s) of infection/cellulitis []  Pain []  Peau d'orange []  Chest and/or axillary swelling caused by: []  History of  chest/breast/abdominal surgery(ies) []  Lymph node dissection, radiation and or chemotherapy []  Truncal and/or abdominal swelling []  Axillary cording [x]  Other, explain: lymphedema in the suprapubic area - with history of lymph node removal x 2 from on the Lt vulva in 2007 and 2010 by Dr. Ellyn Hack -  could worsen without advanced pump coverage   CONSERVATIVE TREATMENT (CT) INITATION Start date (if different from this visit date):02/27/23  Conservative treatment plan includes the following activities (check all that apply): [x]  Regular exercise  [x]  Elevation of the limb(s) [x]  Compression garment(s) or compression bandage system  []  Compression garments with a minimum of             mmHg distally  []  Compression bra and/or tank top [x]  Compression bandage system  Type: [x]  Multi-layer short stretch []  Velcro reduction kit    []  Two- or three-layer prepackaged compression kit   []  Other:  [x]  Complete decongestive therapy (CDT) or lymphedema therapy   [x]  Minimum of 30 minutes daily MLD or self-MLD  [x]  Diet assessment with modification, if applicable. []  For patients with congestive heart failure (CHF) a medication assessment with modification(s), if applicable. []  Other:   Has the patient tried an [id number] basic pneumatic compression device? []  Yes  [x]  No Comments:   MEASUREMENTS Abdominal measurement at umbilicus: 105.5 cm See above for other leg measurements

## 2023-04-29 ENCOUNTER — Encounter: Payer: Medicare Other | Admitting: Rehabilitation

## 2023-05-01 ENCOUNTER — Encounter: Payer: Medicare Other | Admitting: Rehabilitation

## 2023-05-03 ENCOUNTER — Encounter: Payer: Medicare Other | Admitting: Rehabilitation

## 2023-05-06 ENCOUNTER — Encounter: Payer: Medicare Other | Admitting: Rehabilitation

## 2023-05-08 ENCOUNTER — Encounter: Payer: Self-pay | Admitting: Rehabilitation

## 2023-05-08 ENCOUNTER — Ambulatory Visit: Payer: Medicare Other | Admitting: Rehabilitation

## 2023-05-08 ENCOUNTER — Encounter: Payer: Medicare Other | Admitting: Rehabilitation

## 2023-05-08 DIAGNOSIS — R262 Difficulty in walking, not elsewhere classified: Secondary | ICD-10-CM

## 2023-05-08 DIAGNOSIS — I890 Lymphedema, not elsewhere classified: Secondary | ICD-10-CM

## 2023-05-08 NOTE — Therapy (Signed)
OUTPATIENT PHYSICAL THERAPY LOWER EXTREMITY LYMPHEDEMA TREATMENT  Patient Name: Savannah [name] MRN: [id number] DOB:1956-04-19, 67 y.o., female Today's Date: 05/08/2023  END OF SESSION:  PT End of Session - 05/08/23 1059     Visit Number 15    Number of Visits 25    Date for PT Re-Evaluation 06/07/23    PT Start Time 1100    Activity Tolerance Patient tolerated treatment well    Behavior During Therapy [facility] for tasks assessed/performed                 Past Medical History:  Diagnosis Date   ABDOMINAL PAIN, CHRONIC 10/20/2009   Acute cystitis 08/17/2009   ALLERGIC RHINITIS 11/21/2007   Allergy    ASYMPTOMATIC POSTMENOPAUSAL STATUS 09/29/2008   Bariatric surgery status 07/07/2010   Bariatric surgery status 07/07/2010   Qualifier: Diagnosis of  By: Everardo All MD, Sean A    Cancer [facility])    uterine   Cataract    Coronary artery disease    DEPRESSION 11/21/2007   DIABETES MELLITUS, TYPE II 07/08/2007   DYSPNEA 05/20/2009   Edema 05/20/2009   Eosinophilic esophagitis 02/13/2008   FEVER UNSPECIFIED 10/20/2009   FEVER, HX OF 03/09/2010   GERD (gastroesophageal reflux disease)    Headache(784.0) 02/06/2010   Hepatomegaly 01/06/2008   HYPERLIPIDEMIA 07/08/2007   HYPERTENSION 07/08/2007   LEUKOPENIA, MILD 09/29/2008   Melanoma in situ of left upper arm (HCC) 04/03/2023   OBESITY 01/06/2008   OSTEOARTHRITIS 01/06/2008   Other chronic nonalcoholic liver disease 01/06/2008   PERIPHERAL NEUROPATHY 11/21/2007   Peripheral neuropathy    Postsurgical hypothyroidism 09/19/2010   SINUSITIS- ACUTE-NOS 11/21/2007   TB SKIN TEST, POSITIVE 02/06/2010   THYROID NODULE 03/09/2010   Past Surgical History:  Procedure Laterality Date   ABDOMINAL HYSTERECTOMY     BASAL CELL CARCINOMA EXCISION     CARDIAC CATHETERIZATION     CHOLECYSTECTOMY N/A 05/11/2021   Procedure: LAPAROSCOPIC CHOLECYSTECTOMY WITH POSSIBLE  INTRAOPERATIVE CHOLANGIOGRAM;  Surgeon: Luretha Murphy, MD;  Location: [facility]  ORS;  Service: General;  Laterality: N/A;   COLONOSCOPY     CORONARY ATHERECTOMY N/A 05/03/2022   Procedure: CORONARY ATHERECTOMY;  Surgeon: Orbie Pyo, MD;  Location: [facility] INVASIVE CV LAB;  Service: Cardiovascular;  Laterality: N/A;   CORONARY IMAGING/OCT N/A 05/03/2022   Procedure: INTRAVASCULAR IMAGING/OCT;  Surgeon: Orbie Pyo, MD;  Location: [facility] INV[facility] CV LAB;  Service: Cardiovascular;  Laterality: N/A;   CORONARY PRESSURE/FFR STUDY N/A 05/03/2022   Procedure: INTRAVASCULAR PRESSURE WIRE/FFR STUDY;  Surgeon: Orbie Pyo, MD;  Location: [facility];  Service: Cardiovascular;  Laterality: N/A;   CORONARY STENT INTERVENTION N/A 05/03/2022   Procedure: CORONARY STENT INTERVENTION;  Surgeon: Orbie Pyo, MD;  Location: [facility];  Service: Cardiovascular;  Laterality: N/A;  lad   EYE SURGERY Bilateral 08/2018, 09/2018   FOOT SURGERY Left    10/2019   KNEE ARTHROSCOPY Left    LEFT HEART CATH AND CORONARY ANGIOGRAPHY N/A 01/20/2019   Procedure: LEFT HEART CATH AND CORONARY ANGIOGRAPHY;  Surgeon: Lyn Records, MD;  Location: [facility];  Service: Cardiovascular;  Laterality: N/A;   LYMPH NODE DISSECTION     OOPHORECTOMY     Right total knee replacement     RIGHT/LEFT HEART CATH AND CORONARY ANGIOGRAPHY N/A 05/03/2022   Procedure: RIGHT/LEFT HEART CATH AND CORONARY ANGIOGRAPHY;  Surgeon: Orbie Pyo, MD;  Location: [facility];  Service: Cardiovascular;  Laterality: N/A;  ROUX-EN-Y GASTRIC BYPASS  2011   SKIN TAG REMOVAL     THYROIDECTOMY     TONSILLECTOMY AND ADENOIDECTOMY     TOOTH EXTRACTION  2023   UPPER GASTROINTESTINAL ENDOSCOPY     Patient Active Problem List   Diagnosis Date Noted   Bilateral carpal tunnel syndrome 03/13/2022   Leg edema 03/01/2022   Nondisplaced comminuted fracture of left patella, initial encounter for closed fracture 08/01/2021   Arthritis of carpometacarpal (CMC) joint of right thumb 07/28/2021    Primary osteoarthritis of right distal radioulnar joint 07/28/2021   Arthritis of wrist, right, degenerative 07/28/2021   History of malignant neoplasm of uterine body 07/27/2021   Osteopenia 01/14/2021   Sesamoiditis of left foot 11/09/2020   Posterior tibial tendinitis of left lower extremity 11/09/2020   Type 2 diabetes mellitus with diabetic peripheral angiopathy without gangrene, with long-term current use of insulin (HCC) 10/21/2020   Iron deficiency anemia 03/14/2020   Lesion of vulva 09/02/2019   Menorrhagia 09/02/2019   Neuropathy 09/02/2019   Irregular bowel habits 06/25/2019   Coronary artery disease involving native coronary artery of native heart without angina pectoris    Temporomandibular joint disorder 12/05/2018   Fatigue 12/05/2018   Inverted nipple 12/05/2018   Senile purpura (HCC) 11/21/2018   GERD with esophagitis 11/21/2018   Pseudogout involving multiple joints 11/21/2018   Lipoma 07/22/2017   Lichen sclerosus 11/16/2016   H/O gastric bypass 11/16/2016   Depression 11/16/2016   Diabetic polyneuropathy associated with type 2 diabetes mellitus (HCC) 11/16/2016   Uterine cancer (HCC) s/p hysterectomy 1997 01/04/2012   Postsurgical hypothyroidism 09/19/2010   Diabetic neuropathy (HCC) 11/21/2007   Allergic rhinitis 11/21/2007   Dyslipidemia associated with type 2 diabetes mellitus (HCC) 07/08/2007    PCP: [name], MD  REFERRING PROVIDER: Ardith Dark, MD  REFERRING DIAG: R60.0 (ICD-10-CM) - Leg edema  THERAPY DIAG:  Lymphedema, not elsewhere classified  Difficulty in walking, not elsewhere classified  Rationale for Evaluation and Treatment: Rehabilitation  ONSET DATE: Prior to 1997  SUBJECTIVE:                                                                                                                                                                                           SUBJECTIVE STATEMENT:  I just dont' like that boot  EVAL: I  have been having swelling since before 1997 that was mostly in my ankles. Then after my surgery for uterine cancer it worsened on the L leg. I take lasix but it has minimal effect. I have never had lymphedema therapy. I had prescription strength thigh highs but they did not help.  PERTINENT HISTORY:  Uterine cancer in 1997 with lymph node removal (pt reports the surgeon told her he got all the lymph nodes he could find bilaterally), pt reports her vascular doctor told her the left leg edema was vascular, diabetic, has hx of cardiac stent   PAIN:  Are you having pain? No  PRECAUTIONS: Other: R Knee TKA  WEIGHT BEARING RESTRICTIONS: No  FALLS:  Has patient fallen in last 6 months? Yes. Number of falls 3 or 4 Falls due to feeling unsteady, has neuropathy, BP has been an issue - low  PATIENT GOALS: to get the swelling down   OBJECTIVE:  SENSATION: pt reports neuropathy in bilateral feet  LYMPHEDEMA ASSESSMENTS:   LE LANDMARK RIGHT EVAL  At groin   30 cm proximal to suprapatella   20 cm proximal to suprapatella 47  10 cm proximal to suprapatella 40.3  At midpatella / popliteal crease 39  30 cm proximal to floor at lateral plantar foot 32.1  20 cm proximal to floor at lateral plantar foot 26.1  10 cm proximal to floor at lateral plantar foot 25.5  Circumference of ankle/heel   5 cm proximal to 1st MTP joint 22.7  Across MTP joint 24  Around proximal great toe 8  (Blank rows = not tested)  LE LANDMARK LEFT EVAL 03/18/23 03/27/23 04/05/23 04/26/23  At groin       30 cm proximal to suprapatella       20 cm proximal to suprapatella 46 47 44.5    10 cm proximal to suprapatella 41.2 43 41 41   At midpatella / popliteal crease 40.3 40.3 39.5 40   30 cm proximal to floor at lateral plantar foot 37.5 37.7 32 32.3   20 cm proximal to floor at lateral plantar foot 30.3 31.2 28.1 27.5   10 cm proximal to floor at lateral plantar foot 28.1 27.6 28 26.5   Circumference of ankle/heel        5 cm proximal to 1st MTP joint 25 24.7 23.5 24   Across MTP joint 23.4 24 23.3 23.3   Around proximal great toe 8.7 8.7 8.9 8.6   (Blank rows = not tested)   TODAY'S TREATMENT:                                                                                                                                         DATE:  05/08/23 Manual Therapy  MLD to Lt LE as follows: Supine with bil LE's on bolster: Short neck, superficial and deep abdominals, Left axillary nodes, Lt inguino-axillary anastomosis, then Lt LE working proximal to distal and then back proximally reversing all steps.  Focused on areas of increased swelling at the foot and ankle.  Compression Bandaging to Lt LE as follows: (knee high only) Cocoa butter to Lt LE including foot/ankle, TG soft x1 from foot to knee, Elastomull to toes 1-4,  Artiflex to foot/ankle, and rosidal soft to knee, then 6 cm in Roman sandal, then 8 cm ASH, 2 - 10 cm from ankle to knee,   04/26/23 Manual Therapy  Made adjustments to her current sandal to lengthen the velcro strap so she is able to wear it home and not the cast shoe.  MLD to Lt LE as follows: Supine with bil LE's on bolster: Short neck, superficial and deep abdominals, Left axillary nodes, Lt inguino-axillary anastomosis, then Lt LE working proximal to distal and then back proximally reversing all steps.  Focused on areas of increased swelling at the foot and ankle.  Compression Bandaging to Lt LE as follows: (knee high only) Cocoa butter to Lt LE including foot/ankle, TG soft x1 from foot to knee, Elastomull to toes 1-4, Artiflex to foot/ankle, and rosidal soft to knee, then 6 cm in Roman sandal, then 8 cm ASH, 2 - 10 cm from ankle to knee,   04/19/23 Manual Therapy  MLD to Lt LE as follows: Supine with bil LE's on bolster: Short neck, superficial and deep abdominals, Left axillary nodes, Lt inguino-axillary anastomosis, then Lt LE working proximal to distal and then back proximally reversing all  steps.  Focused on areas of increased swelling at the foot and ankle.  Compression Bandaging to Lt LE as follows: (knee high only) Cocoa butter to Lt LE including foot/ankle, TG soft x1 from foot to knee, Elastomull to toes 1-4, Artiflex to foot/ankle, and rosidal soft to knee, then 6 cm in Roman sandal, then 8 cm ASH, 2 - 10 cm from ankle to knee,   04/12/23 Manual Therapy  MLD to Lt LE as follows: Supine with bil LE's on bolster: Short neck, superficial and deep abdominals, Left axillary nodes, Lt inguino-axillary anastomosis, then Lt LE working proximal to distal and then back proximally reversing all steps.  Focused on areas of increased swelling at the foot and ankle.  Compression Bandaging to Lt LE as follows: (knee high only) Cocoa butter to Lt LE including foot/ankle, TG soft x1 from foot to knee, Elastomull to toes 1-4, Artiflex to foot/ankle, and rosidal soft to knee, then 6 cm in Roman sandal, then 8 cm ASH, 2 - 10 cm from ankle to knee,   PATIENT EDUCATION:  Education details: anatomy and physiology of the lymphatic system, need for day and night time compression garments, compression bandaging vs compression garments, venous insufficiency vs lymphedema Person educated: Patient Education method: Explanation Education comprehension: verbalized understanding  HOME EXERCISE PROGRAM: Get post op shoes and appropriate clothing to begin bandaging at next session  ASSESSMENT:  CLINICAL IMPRESSION: Pt is doing well with her own bandaging. She continues with pitting at the lower leg and foot.  We will continue 1x per week to assess maintenance until garments come.  Updated flexitouch note to include pelvic hx but pt reports they told her she would need to trial the basic pump anyway.   OBJECTIVE IMPAIRMENTS: decreased knowledge of condition, decreased knowledge of use of DME, difficulty walking, increased edema, and pain.   ACTIVITY LIMITATIONS: standing and locomotion  level  PARTICIPATION LIMITATIONS: community activity  PERSONAL FACTORS: Fitness and Time since onset of injury/illness/exacerbation are also affecting patient's functional outcome.   REHAB POTENTIAL: Good  CLINICAL DECISION MAKING: Evolving/moderate complexity  EVALUATION COMPLEXITY: Moderate   GOALS: Goals reviewed with patient? Yes  SHORT TERM GOALS: Target date: 03/27/23  Pt will demonstrate a 3 cm decrease in edema at 30 cm superior to floor at lateral  malleoli.  Baseline: Goal status: MET   LONG TERM GOALS: Target date: 04/24/23  Pt will be independent in self MLD for long term management of lymphedema.  Baseline:  Goal status: MET  2.  Pt will receive trial of FlexiTouch compression pump for long term management of lymphedema.  Baseline:  Goal status: ONGOING  3.  Pt will obtain appropriate day and night time garments for long term management of lymphedema.  Baseline:  Goal status: ONGOING  4.  Pt will demonstrate maximal circumferential reduction to decrease risk of cellulitis and improve mobility.  Baseline:  Goal status: MOSTLY MET    PLAN:  PT FREQUENCY: 3x/week  PT DURATION: 4 weeks  PLANNED INTERVENTIONS: Therapeutic exercises, Therapeutic activity, Patient/Family education, Self Care, Orthotic/Fit training, Manual lymph drainage, Compression bandaging, Taping, Vasopneumatic device, Manual therapy, and Re-evaluation  PLAN FOR NEXT SESSION: measure next visit. send in pump demo and notes around 5/20 if interested, cont CDT to L LE, teach pt bandaging and MLD as able.    Idamae Lusher, PT 05/08/2023, 10:59 AM    DIAGNOSIS ASSESSMENT [x]  Lymphedema, not elsewhere classified [I89.0]  Secondary to: Uterine cancer treatment  []  Post-mastectomy lymphedema [I97.2]   []  Hereditary/Congenital lymphedema [Q82.0]  []  Praecox []  Tarda []  Other:   LOCATION OF LYMPHEDEMA []   Right Arm  []  Left Arm  []  Chest/Breast  [x]  Abdomen/Trunk [x]  Other: Lt Lower  extremity lymphedema, Lt vulva and suprapubic up to umbillicus  Date of onset, or duration: 1997, CDT from 02/27/23 to current  Pt is compliant with therapy with self bandaging and MLD since 02/27/23  SEVERITY Symptoms and observations from physical exam Stage of Lymphedema:  [x] Stage II  []  Stage III  [] Other: []  Hyperkeratosis []  Hyperpigmentation []  Lymphorrhea (weeping) []  Papillomatosis (warts, nodules, papules)  []  Elephantiasis  [x]  Fibrosis and/or radiation fibrosis [x]  Pitting edema []  Recurrent episode(s) of infection/cellulitis []  Pain []  Peau d'orange []  Chest and/or axillary swelling caused by: []  History of chest/breast/abdominal surgery(ies) []  Lymph node dissection, radiation and or chemotherapy []  Truncal and/or abdominal swelling []  Axillary cording [x]  Other, explain: lymphedema in the suprapubic area - with history of lymph node removal x 2 from on the Lt vulva in 2007 and 2010 by Dr. Ellyn Hack -  could worsen without advanced pump coverage   CONSERVATIVE TREATMENT (CT) INITATION Start date (if different from this visit date):02/27/23  Conservative treatment plan includes the following activities (check all that apply): [x]  Regular exercise  [x]  Elevation of the limb(s) [x]  Compression garment(s) or compression bandage system  []  Compression garments with a minimum of             mmHg distally  []  Compression bra and/or tank top [x]  Compression bandage system  Type: [x]  Multi-layer short stretch []  Velcro reduction kit    []  Two- or three-layer prepackaged compression kit   []  Other:  [x]  Complete decongestive therapy (CDT) or lymphedema therapy   [x]  Minimum of 30 minutes daily MLD or self-MLD  [x]  Diet assessment with modification, if applicable. []  For patients with congestive heart failure (CHF) a medication assessment with modification(s), if applicable. []  Other:   Has the patient tried an [id number] basic pneumatic compression device? []  Yes  [x]  No Comments:    MEASUREMENTS Abdominal measurement at umbilicus: 105.5 cm See above for other leg measurements

## 2023-05-10 ENCOUNTER — Encounter: Payer: Medicare Other | Admitting: Rehabilitation

## 2023-05-13 ENCOUNTER — Ambulatory Visit: Payer: Medicare Other | Attending: Family Medicine | Admitting: Rehabilitation

## 2023-05-13 ENCOUNTER — Encounter: Payer: Self-pay | Admitting: Rehabilitation

## 2023-05-13 DIAGNOSIS — R262 Difficulty in walking, not elsewhere classified: Secondary | ICD-10-CM | POA: Diagnosis not present

## 2023-05-13 DIAGNOSIS — I890 Lymphedema, not elsewhere classified: Secondary | ICD-10-CM | POA: Diagnosis not present

## 2023-05-13 NOTE — Therapy (Signed)
OUTPATIENT PHYSICAL THERAPY LOWER EXTREMITY LYMPHEDEMA TREATMENT  Patient Name: Savannah [name] MRN: [id number] DOB:11-06-1956, 67 y.o., female Today's Date: 05/13/2023  END OF SESSION:  PT End of Session - 05/13/23 1547     Visit Number 16    Number of Visits 25    Date for PT Re-Evaluation 06/07/23    PT Start Time 1304    PT Stop Time 1359    PT Time Calculation (min) 55 min    Activity Tolerance Patient tolerated treatment well    Behavior During Therapy [facility] for tasks assessed/performed                  Past Medical History:  Diagnosis Date   ABDOMINAL PAIN, CHRONIC 10/20/2009   Acute cystitis 08/17/2009   ALLERGIC RHINITIS 11/21/2007   Allergy    ASYMPTOMATIC POSTMENOPAUSAL STATUS 09/29/2008   Bariatric surgery status 07/07/2010   Bariatric surgery status 07/07/2010   Qualifier: Diagnosis of  By: Everardo All MD, Sean A    Cancer [facility])    uterine   Cataract    Coronary artery disease    DEPRESSION 11/21/2007   DIABETES MELLITUS, TYPE II 07/08/2007   DYSPNEA 05/20/2009   Edema 05/20/2009   Eosinophilic esophagitis 02/13/2008   FEVER UNSPECIFIED 10/20/2009   FEVER, HX OF 03/09/2010   GERD (gastroesophageal reflux disease)    Headache(784.0) 02/06/2010   Hepatomegaly 01/06/2008   HYPERLIPIDEMIA 07/08/2007   HYPERTENSION 07/08/2007   LEUKOPENIA, MILD 09/29/2008   Melanoma in situ of left upper arm (HCC) 04/03/2023   OBESITY 01/06/2008   OSTEOARTHRITIS 01/06/2008   Other chronic nonalcoholic liver disease 01/06/2008   PERIPHERAL NEUROPATHY 11/21/2007   Peripheral neuropathy    Postsurgical hypothyroidism 09/19/2010   SINUSITIS- ACUTE-NOS 11/21/2007   TB SKIN TEST, POSITIVE 02/06/2010   THYROID NODULE 03/09/2010   Past Surgical History:  Procedure Laterality Date   ABDOMINAL HYSTERECTOMY     BASAL CELL CARCINOMA EXCISION     CARDIAC CATHETERIZATION     CHOLECYSTECTOMY N/A 05/11/2021   Procedure: LAPAROSCOPIC CHOLECYSTECTOMY WITH POSSIBLE  INTRAOPERATIVE  CHOLANGIOGRAM;  Surgeon: Luretha Murphy, MD;  Location: [facility];  Service: General;  Laterality: N/A;   COLONOSCOPY     CORONARY ATHERECTOMY N/A 05/03/2022   Procedure: CORONARY ATHERECTOMY;  Surgeon: Orbie Pyo, MD;  Location: [facility];  Service: Cardiovascular;  Laterality: N/A;   CORONARY IMAGING/OCT N/A 05/03/2022   Procedure: INTRAVASCULAR IMAGING/OCT;  Surgeon: Orbie Pyo, MD;  Location: [facility];  Service: Cardiovascular;  Laterality: N/A;   CORONARY PRESSURE/FFR STUDY N/A 05/03/2022   Procedure: INTRAVASCULAR PRESSURE WIRE/FFR STUDY;  Surgeon: Orbie Pyo, MD;  Location: [facility];  Service: Cardiovascular;  Laterality: N/A;   CORONARY STENT INTERVENTION N/A 05/03/2022   Procedure: CORONARY STENT INTERVENTION;  Surgeon: Orbie Pyo, MD;  Location: [facility];  Service: Cardiovascular;  Laterality: N/A;  lad   EYE SURGERY Bilateral 08/2018, 09/2018   FOOT SURGERY Left    10/2019   KNEE ARTHROSCOPY Left    LEFT HEART CATH AND CORONARY ANGIOGRAPHY N/A 01/20/2019   Procedure: LEFT HEART CATH AND CORONARY ANGIOGRAPHY;  Surgeon: Lyn Records, MD;  Location: [facility];  Service: Cardiovascular;  Laterality: N/A;   LYMPH NODE DISSECTION     OOPHORECTOMY     Right total knee replacement     RIGHT/LEFT HEART CATH AND CORONARY ANGIOGRAPHY N/A 05/03/2022   Procedure: RIGHT/LEFT HEART CATH AND CORONARY ANGIOGRAPHY;  Surgeon: Lynnette Caffey,  Charlies Constable, MD;  Location: [facility];  Service: Cardiovascular;  Laterality: N/A;   ROUX-EN-Y GASTRIC BYPASS  2011   SKIN TAG REMOVAL     THYROIDECTOMY     TONSILLECTOMY AND ADENOIDECTOMY     TOOTH EXTRACTION  2023   UPPER GASTROINTESTINAL ENDOSCOPY     Patient Active Problem List   Diagnosis Date Noted   Bilateral carpal tunnel syndrome 03/13/2022   Leg edema 03/01/2022   Nondisplaced comminuted fracture of left patella, initial encounter for closed fracture 08/01/2021   Arthritis of  carpometacarpal (CMC) joint of right thumb 07/28/2021   Primary osteoarthritis of right distal radioulnar joint 07/28/2021   Arthritis of wrist, right, degenerative 07/28/2021   History of malignant neoplasm of uterine body 07/27/2021   Osteopenia 01/14/2021   Sesamoiditis of left foot 11/09/2020   Posterior tibial tendinitis of left lower extremity 11/09/2020   Type 2 diabetes mellitus with diabetic peripheral angiopathy without gangrene, with long-term current use of insulin (HCC) 10/21/2020   Iron deficiency anemia 03/14/2020   Lesion of vulva 09/02/2019   Menorrhagia 09/02/2019   Neuropathy 09/02/2019   Irregular bowel habits 06/25/2019   Coronary artery disease involving native coronary artery of native heart without angina pectoris    Temporomandibular joint disorder 12/05/2018   Fatigue 12/05/2018   Inverted nipple 12/05/2018   Senile purpura (HCC) 11/21/2018   GERD with esophagitis 11/21/2018   Pseudogout involving multiple joints 11/21/2018   Lipoma 07/22/2017   Lichen sclerosus 11/16/2016   H/O gastric bypass 11/16/2016   Depression 11/16/2016   Diabetic polyneuropathy associated with type 2 diabetes mellitus (HCC) 11/16/2016   Uterine cancer (HCC) s/p hysterectomy 1997 01/04/2012   Postsurgical hypothyroidism 09/19/2010   Diabetic neuropathy (HCC) 11/21/2007   Allergic rhinitis 11/21/2007   Dyslipidemia associated with type 2 diabetes mellitus (HCC) 07/08/2007    PCP: [name], MD  REFERRING PROVIDER: Ardith Dark, MD  REFERRING DIAG: R60.0 (ICD-10-CM) - Leg edema  THERAPY DIAG:  Lymphedema, not elsewhere classified  Difficulty in walking, not elsewhere classified  Rationale for Evaluation and Treatment: Rehabilitation  ONSET DATE: Prior to 1997  SUBJECTIVE:                                                                                                                                                                                            SUBJECTIVE STATEMENT:  I took them off last night.    EVAL: I have been having swelling since before 1997 that was mostly in my ankles. Then after my surgery for uterine cancer it worsened on the L leg. I take lasix but it has  minimal effect. I have never had lymphedema therapy. I had prescription strength thigh highs but they did not help.   PERTINENT HISTORY:  Uterine cancer in 1997 with lymph node removal (pt reports the surgeon told her he got all the lymph nodes he could find bilaterally), pt reports her vascular doctor told her the left leg edema was vascular, diabetic, has hx of cardiac stent   PAIN:  Are you having pain? No  PRECAUTIONS: Other: R Knee TKA  WEIGHT BEARING RESTRICTIONS: No  FALLS:  Has patient fallen in last 6 months? Yes. Number of falls 3 or 4 Falls due to feeling unsteady, has neuropathy, BP has been an issue - low  PATIENT GOALS: to get the swelling down   OBJECTIVE:  SENSATION: pt reports neuropathy in bilateral feet  LYMPHEDEMA ASSESSMENTS:   LE LANDMARK RIGHT EVAL  At groin   30 cm proximal to suprapatella   20 cm proximal to suprapatella 47  10 cm proximal to suprapatella 40.3  At midpatella / popliteal crease 39  30 cm proximal to floor at lateral plantar foot 32.1  20 cm proximal to floor at lateral plantar foot 26.1  10 cm proximal to floor at lateral plantar foot 25.5  Circumference of ankle/heel   5 cm proximal to 1st MTP joint 22.7  Across MTP joint 24  Around proximal great toe 8  (Blank rows = not tested)  LE LANDMARK LEFT EVAL 03/18/23 03/27/23 04/05/23 04/26/23  At groin       30 cm proximal to suprapatella       20 cm proximal to suprapatella 46 47 44.5    10 cm proximal to suprapatella 41.2 43 41 41   At midpatella / popliteal crease 40.3 40.3 39.5 40   30 cm proximal to floor at lateral plantar foot 37.5 37.7 32 32.3   20 cm proximal to floor at lateral plantar foot 30.3 31.2 28.1 27.5   10 cm proximal to floor at lateral  plantar foot 28.1 27.6 28 26.5   Circumference of ankle/heel       5 cm proximal to 1st MTP joint 25 24.7 23.5 24   Across MTP joint 23.4 24 23.3 23.3   Around proximal great toe 8.7 8.7 8.9 8.6   (Blank rows = not tested)   TODAY'S TREATMENT:                                                                                                                                         DATE:  05/13/23 Manual Therapy  MLD to Lt LE as follows: Supine with bil LE's on bolster: Short neck, superficial and deep abdominals, Left axillary nodes, Lt inguino-axillary anastomosis, then Lt LE working proximal to distal and then back proximally reversing all steps.  Focused on areas of increased swelling at the foot and ankle.  Compression Bandaging to Lt LE as follows: (  knee high only) Cocoa butter to Lt LE including foot/ankle, TG soft x1 from foot to knee, Elastomull to toes 1-4, Artiflex to foot/ankle, and rosidal soft to knee, then 6 cm in Roman sandal, then 8 cm ASH, 2 - 10 cm from ankle to knee,   05/08/23 Manual Therapy  MLD to Lt LE as follows: Supine with bil LE's on bolster: Short neck, superficial and deep abdominals, Left axillary nodes, Lt inguino-axillary anastomosis, then Lt LE working proximal to distal and then back proximally reversing all steps.  Focused on areas of increased swelling at the foot and ankle.  Compression Bandaging to Lt LE as follows: (knee high only) Cocoa butter to Lt LE including foot/ankle, TG soft x1 from foot to knee, Elastomull to toes 1-4, Artiflex to foot/ankle, and rosidal soft to knee, then 6 cm in Roman sandal, then 8 cm ASH, 2 - 10 cm from ankle to knee,   04/26/23 Manual Therapy  Made adjustments to her current sandal to lengthen the velcro strap so she is able to wear it home and not the cast shoe.  MLD to Lt LE as follows: Supine with bil LE's on bolster: Short neck, superficial and deep abdominals, Left axillary nodes, Lt inguino-axillary anastomosis, then Lt LE  working proximal to distal and then back proximally reversing all steps.  Focused on areas of increased swelling at the foot and ankle.  Compression Bandaging to Lt LE as follows: (knee high only) Cocoa butter to Lt LE including foot/ankle, TG soft x1 from foot to knee, Elastomull to toes 1-4, Artiflex to foot/ankle, and rosidal soft to knee, then 6 cm in Roman sandal, then 8 cm ASH, 2 - 10 cm from ankle to knee,   PATIENT EDUCATION:  Education details: anatomy and physiology of the lymphatic system, need for day and night time compression garments, compression bandaging vs compression garments, venous insufficiency vs lymphedema Person educated: Patient Education method: Explanation Education comprehension: verbalized understanding  HOME EXERCISE PROGRAM: Get post op shoes and appropriate clothing to begin bandaging at next session  ASSESSMENT:  CLINICAL IMPRESSION: Pt is doing well with her own bandaging. She continues with pitting at the lower leg and foot.  We will continue 1x per week to assess maintenance until garments come.     OBJECTIVE IMPAIRMENTS: decreased knowledge of condition, decreased knowledge of use of DME, difficulty walking, increased edema, and pain.   ACTIVITY LIMITATIONS: standing and locomotion level  PARTICIPATION LIMITATIONS: community activity  PERSONAL FACTORS: Fitness and Time since onset of injury/illness/exacerbation are also affecting patient's functional outcome.   REHAB POTENTIAL: Good  CLINICAL DECISION MAKING: Evolving/moderate complexity  EVALUATION COMPLEXITY: Moderate   GOALS: Goals reviewed with patient? Yes  SHORT TERM GOALS: Target date: 03/27/23  Pt will demonstrate a 3 cm decrease in edema at 30 cm superior to floor at lateral malleoli.  Baseline: Goal status: MET   LONG TERM GOALS: Target date: 04/24/23  Pt will be independent in self MLD for long term management of lymphedema.  Baseline:  Goal status: MET  2.  Pt will  receive trial of FlexiTouch compression pump for long term management of lymphedema.  Baseline:  Goal status: ONGOING  3.  Pt will obtain appropriate day and night time garments for long term management of lymphedema.  Baseline:  Goal status: ONGOING  4.  Pt will demonstrate maximal circumferential reduction to decrease risk of cellulitis and improve mobility.  Baseline:  Goal status: MOSTLY MET    PLAN:  PT  FREQUENCY: 3x/week  PT DURATION: 4 weeks  PLANNED INTERVENTIONS: Therapeutic exercises, Therapeutic activity, Patient/Family education, Self Care, Orthotic/Fit training, Manual lymph drainage, Compression bandaging, Taping, Vasopneumatic device, Manual therapy, and Re-evaluation  PLAN FOR NEXT SESSION: measure next visit. send in pump demo and notes around 5/20 if interested, cont CDT to L LE, teach pt bandaging and MLD as able.    Idamae Lusher, PT 05/13/2023, 3:48 PM    DIAGNOSIS ASSESSMENT [x]  Lymphedema, not elsewhere classified [I89.0]  Secondary to: Uterine cancer treatment  []  Post-mastectomy lymphedema [I97.2]   []  Hereditary/Congenital lymphedema [Q82.0]  []  Praecox []  Tarda []  Other:   LOCATION OF LYMPHEDEMA []   Right Arm  []  Left Arm  []  Chest/Breast  [x]  Abdomen/Trunk [x]  Other: Lt Lower extremity lymphedema, Lt vulva and suprapubic up to umbillicus  Date of onset, or duration: 1997, CDT from 02/27/23 to current  Pt is compliant with therapy with self bandaging and MLD since 02/27/23  SEVERITY Symptoms and observations from physical exam Stage of Lymphedema:  [x] Stage II  []  Stage III  [] Other: []  Hyperkeratosis []  Hyperpigmentation []  Lymphorrhea (weeping) []  Papillomatosis (warts, nodules, papules)  []  Elephantiasis  [x]  Fibrosis and/or radiation fibrosis [x]  Pitting edema []  Recurrent episode(s) of infection/cellulitis []  Pain []  Peau d'orange []  Chest and/or axillary swelling caused by: []  History of chest/breast/abdominal surgery(ies) []  Lymph  node dissection, radiation and or chemotherapy []  Truncal and/or abdominal swelling []  Axillary cording [x]  Other, explain: lymphedema in the suprapubic area - with history of lymph node removal x 2 from on the Lt vulva in 2007 and 2010 by Dr. Ellyn Hack -  could worsen without advanced pump coverage   CONSERVATIVE TREATMENT (CT) INITATION Start date (if different from this visit date):02/27/23  Conservative treatment plan includes the following activities (check all that apply): [x]  Regular exercise  [x]  Elevation of the limb(s) [x]  Compression garment(s) or compression bandage system  []  Compression garments with a minimum of             mmHg distally  []  Compression bra and/or tank top [x]  Compression bandage system  Type: [x]  Multi-layer short stretch []  Velcro reduction kit    []  Two- or three-layer prepackaged compression kit   []  Other:  [x]  Complete decongestive therapy (CDT) or lymphedema therapy   [x]  Minimum of 30 minutes daily MLD or self-MLD  [x]  Diet assessment with modification, if applicable. []  For patients with congestive heart failure (CHF) a medication assessment with modification(s), if applicable. []  Other:   Has the patient tried an [id number] basic pneumatic compression device? []  Yes  [x]  No Comments:   MEASUREMENTS Abdominal measurement at umbilicus: 105.5 cm See above for other leg measurements

## 2023-05-14 ENCOUNTER — Encounter: Payer: Medicare Other | Admitting: Rehabilitation

## 2023-05-17 ENCOUNTER — Ambulatory Visit: Payer: Medicare Other | Attending: Internal Medicine | Admitting: Internal Medicine

## 2023-05-17 ENCOUNTER — Encounter: Payer: Self-pay | Admitting: Internal Medicine

## 2023-05-17 VITALS — BP 98/60 | HR 79 | Ht 71.0 in | Wt 176.8 lb

## 2023-05-17 DIAGNOSIS — I2510 Atherosclerotic heart disease of native coronary artery without angina pectoris: Secondary | ICD-10-CM | POA: Diagnosis not present

## 2023-05-17 DIAGNOSIS — R0600 Dyspnea, unspecified: Secondary | ICD-10-CM | POA: Diagnosis not present

## 2023-05-17 DIAGNOSIS — R55 Syncope and collapse: Secondary | ICD-10-CM | POA: Insufficient documentation

## 2023-05-17 DIAGNOSIS — Z79899 Other long term (current) drug therapy: Secondary | ICD-10-CM | POA: Insufficient documentation

## 2023-05-17 DIAGNOSIS — Z955 Presence of coronary angioplasty implant and graft: Secondary | ICD-10-CM | POA: Diagnosis not present

## 2023-05-17 DIAGNOSIS — E785 Hyperlipidemia, unspecified: Secondary | ICD-10-CM | POA: Diagnosis not present

## 2023-05-17 DIAGNOSIS — R0602 Shortness of breath: Secondary | ICD-10-CM | POA: Insufficient documentation

## 2023-05-17 DIAGNOSIS — I5032 Chronic diastolic (congestive) heart failure: Secondary | ICD-10-CM | POA: Diagnosis not present

## 2023-05-17 MED ORDER — NITROGLYCERIN 0.4 MG SL SUBL: 0.4000 mg | SUBLINGUAL_TABLET | SUBLINGUAL | 3 refills | Status: DC | PRN

## 2023-05-17 NOTE — Progress Notes (Signed)
Cardiology Office Note:    Date: 05/17/2023   ID:  Savannah [name], DOB [birth year], MRN [id number]  PCP:  Ardith Dark, MD  Cardiologist:  Parke Poisson, MD  Electrophysiologist:  None   Referring MD: Ardith Dark, MD   Chief Complaint/Reason for Referral: CAD  History of Present Illness:    Savannah [name] is a 67 y.o. female with a history of significant three-vessel coronary artery disease and involving distal small vascular territories, PVCs, esophageal rings with multiple previous dilations, and bariatric surgery in 2011 with appropriate postoperative weight loss presenting to clinic today for follow up for CAD.  She has low blood pressure today and is symptomatic with this.  She is on no blood pressure lowering therapy.  Unclear etiology of low blood pressure and does not seem related to low blood sugar as her blood sugar today was approximately 100 and normal for her.  I encouraged hydration which seems to help as she is quite sensitive to dehydration.  She also notices some shortness of breath getting out of the bath on occasion, does not have shortness of breath with exertion.  Hopes to get back to her walking program soon.  No chest pain, no palpitations.  No falls in the last year, no interval syncope.  Has been following with lymphedema clinic and has stopped her Lasix as a result, frequently wraps legs for benefit of lower extremity swelling.  She has completed 1 year of DAPT.  Past Medical History:  Diagnosis Date   ABDOMINAL PAIN, CHRONIC 10/20/2009   Acute cystitis 08/17/2009   ALLERGIC RHINITIS 11/21/2007   Allergy    ASYMPTOMATIC POSTMENOPAUSAL STATUS 09/29/2008   Bariatric surgery status 07/07/2010   Bariatric surgery status 07/07/2010   Qualifier: Diagnosis of  By: Everardo All MD, Sean A    Cancer [facility])    uterine   Cataract    Coronary artery disease    DEPRESSION 11/21/2007   DIABETES MELLITUS, TYPE II 07/08/2007   DYSPNEA 05/20/2009   Edema  05/20/2009   Eosinophilic esophagitis 02/13/2008   FEVER UNSPECIFIED 10/20/2009   FEVER, HX OF 03/09/2010   GERD (gastroesophageal reflux disease)    Headache(784.0) 02/06/2010   Hepatomegaly 01/06/2008   HYPERLIPIDEMIA 07/08/2007   HYPERTENSION 07/08/2007   LEUKOPENIA, MILD 09/29/2008   Melanoma in situ of left upper arm (HCC) 04/03/2023   OBESITY 01/06/2008   OSTEOARTHRITIS 01/06/2008   Other chronic nonalcoholic liver disease 01/06/2008   PERIPHERAL NEUROPATHY 11/21/2007   Peripheral neuropathy    Postsurgical hypothyroidism 09/19/2010   SINUSITIS- ACUTE-NOS 11/21/2007   TB SKIN TEST, POSITIVE 02/06/2010   THYROID NODULE 03/09/2010    Past Surgical History:  Procedure Laterality Date   ABDOMINAL HYSTERECTOMY     BASAL CELL CARCINOMA EXCISION     CARDIAC CATHETERIZATION     CHOLECYSTECTOMY N/A 05/11/2021   Procedure: LAPAROSCOPIC CHOLECYSTECTOMY WITH POSSIBLE  INTRAOPERATIVE CHOLANGIOGRAM;  Surgeon: Luretha Murphy, MD;  Location: [facility] ORS;  Service: General;  Laterality: N/A;   COLONOSCOPY     CORONARY ATHERECTOMY N/A 05/03/2022   Procedure: CORONARY ATHERECTOMY;  Surgeon: Orbie Pyo, MD;  Location: [facility] INVASIVE CV LAB;  Service: Cardiovascular;  Laterality: N/A;   CORONARY IMAGING/OCT N/A 05/03/2022   Procedure: INTRAVASCULAR IMAGING/OCT;  Surgeon: Orbie Pyo, MD;  Location: [facility] INVASIVE CV LAB;  Service: Cardiovascular;  Laterality: N/A;   CORONARY PRESSURE/FFR STUDY N/A 05/03/2022   Procedure: INTRAVASCULAR PRESSURE WIRE/FFR STUDY;  Surgeon: Orbie Pyo, MD;  Location: [facility] INVASIVE CV  LAB;  Service: Cardiovascular;  Laterality: N/A;   CORONARY STENT INTERVENTION N/A 05/03/2022   Procedure: CORONARY STENT INTERVENTION;  Surgeon: Orbie Pyo, MD;  Location: [facility] INVASIVE CV LAB;  Service: Cardiovascular;  Laterality: N/A;  lad   EYE SURGERY Bilateral 08/2018, 09/2018   FOOT SURGERY Left    10/2019   KNEE ARTHROSCOPY Left    LEFT HEART CATH AND CORONARY  ANGIOGRAPHY N/A 01/20/2019   Procedure: LEFT HEART CATH AND CORONARY ANGIOGRAPHY;  Surgeon: Lyn Records, MD;  Location: [facility] INVASIVE CV LAB;  Service: Cardiovascular;  Laterality: N/A;   LYMPH NODE DISSECTION     OOPHORECTOMY     Right total knee replacement     RIGHT/LEFT HEART CATH AND CORONARY ANGIOGRAPHY N/A 05/03/2022   Procedure: RIGHT/LEFT HEART CATH AND CORONARY ANGIOGRAPHY;  Surgeon: Orbie Pyo, MD;  Location: [facility] INVASIVE CV LAB;  Service: Cardiovascular;  Laterality: N/A;   ROUX-EN-Y GASTRIC BYPASS  2011   SKIN TAG REMOVAL     THYROIDECTOMY     TONSILLECTOMY AND ADENOIDECTOMY     TOOTH EXTRACTION  2023   UPPER GASTROINTESTINAL ENDOSCOPY      Current Medications: Current Meds  Medication Sig   acetaminophen (TYLENOL) 500 MG tablet Take 2 tablets (1,000 mg total) by mouth every 6 (six) hours as needed for mild pain or fever.   AMBULATORY NON FORMULARY MEDICATION Museum/gallery exhibitions officer at [employer].  Correct leg length and improve pronation BL   atorvastatin (LIPITOR) 40 MG tablet Take 1 tablet (40 mg total) by mouth daily.   benzonatate (TESSALON) 200 MG capsule Take 1 capsule (200 mg total) by mouth 2 (two) times daily as needed for cough.   cephALEXin (KEFLEX) 500 MG capsule Take 1 capsule (500 mg total) by mouth 2 (two) times daily.   Cholecalciferol (VITAMIN D) 50 MCG (2000 UT) tablet Take 4,000 Units by mouth in the morning.   clopidogrel (PLAVIX) 75 MG tablet TAKE 1 TABLET BY MOUTH EVERY DAY   cyclobenzaprine (FLEXERIL) 10 MG tablet Take 10 mg by mouth at bedtime as needed for muscle spasms.   diclofenac sodium (VOLTAREN) 1 % GEL Apply 4 g topically 4 (four) times daily.   Dulaglutide (TRULICITY) 4.5 MG/0.5ML SOPN Inject 4.5 mg as directed once a week.   gabapentin (NEURONTIN) 600 MG tablet TAKE 2 TABLETS BY MOUTH 3 TIMES DAILY.   Halobetasol & Lactic Acid (ULTRAVATE X, OINTMENT, EX) Apply 1 Application topically 2 (two) times daily.   INVOKANA 300 MG TABS  tablet TAKE 1 TABLET BY MOUTH DAILY BEFORE BREAKFAST.   levalbuterol (XOPENEX HFA) 45 MCG/ACT inhaler Inhale 1 puff into the lungs every 4 (four) hours as needed for wheezing.   levocetirizine (XYZAL) 5 MG tablet Take 5 mg by mouth every evening.   levothyroxine (SYNTHROID) 100 MCG tablet Take 1 tablet (100 mcg total) by mouth daily.   lidocaine (LIDODERM) 5 % PLACE 1 PATCH ONTO THE SKIN DAILY. REMOVE & DISCARD PATCH WITHIN 12 HOURS OR AS DIRECTED BY MD   MAGNESIUM PO Take 3 tablets by mouth daily as needed (foot cramps).   metFORMIN (GLUCOPHAGE-XR) 500 MG 24 hr tablet TAKE 4 TABLETS (2,000 MG TOTAL) BY MOUTH DAILY.   Multiple Vitamin (MULTIVITAMIN) tablet Take 1 tablet by mouth in the morning and at bedtime.   ONETOUCH ULTRA test strip USE TO MONITOR GLUCOSE LEVELS ONCE PER DAY E11.40   pantoprazole (PROTONIX) 40 MG tablet TAKE 1 TABLET BY MOUTH TWICE A DAY   repaglinide (PRANDIN)  2 MG tablet Take 1 tablet (2 mg total) by mouth 3 (three) times daily before meals.   sulfamethoxazole-trimethoprim (BACTRIM) 400-80 MG tablet Take 1 tablet by mouth 2 (two) times daily.   traMADol (ULTRAM) 50 MG tablet Take 50 mg by mouth every 6 (six) hours as needed (pain).   [DISCONTINUED] aspirin EC 81 MG tablet TAKE 1 TABLET (81 MG TOTAL) BY MOUTH DAILY. SWALLOW WHOLE.   [DISCONTINUED] furosemide (LASIX) 20 MG tablet Take 1 tablet (20 mg total) by mouth as needed.   [DISCONTINUED] nitroGLYCERIN (NITROSTAT) 0.4 MG SL tablet Place 1 tablet (0.4 mg total) under the tongue every 5 (five) minutes as needed.     Allergies:   Ace inhibitors, Caffeine, Ciprofloxacin, Morphine and codeine, Mucinex [guaifenesin er], Nsaids, Other, Silicone, and Tape   Social History   Tobacco Use   Smoking status: Former    Packs/day: 1.50    Years: 15.00    Additional pack years: 0.00    Total pack years: 22.50    Types: Cigarettes    Quit date: 1992    Years since quitting: 32.5    Passive exposure: Never   Smokeless tobacco:  Never  Vaping Use   Vaping Use: Never used  Substance Use Topics   Alcohol use: Yes    Comment: rarely   Drug use: Never     Family History: The patient's family history includes Bladder Cancer in her sister; Breast cancer in her maternal aunt and sister; Colon cancer in her maternal uncle; Congestive Heart Failure in her father; Diabetes in her father, maternal aunt, maternal grandmother, maternal uncle, mother, and paternal grandmother; Hypertension in her father; Liver cancer in her maternal aunt; Multiple sclerosis in her sister; Myelodysplastic syndrome in her father; Neuropathy in her mother and paternal aunt; Osteoporosis in her sister.  ROS:   Please see the history of present illness.    All other systems reviewed and are negative.  EKGs/Labs/Other Studies Reviewed:    The following studies were reviewed today:  EKG: EKG Interpretation Date/Time:  Friday May 17 2023 08:58:23 EDT Ventricular Rate:  77 PR Interval:  130 QRS Duration:  84 QT Interval:  388 QTC Calculation: 439 R Axis:   25  Text Interpretation: Normal sinus rhythm Normal ECG When compared with ECG of 03-May-2022 11:50, No significant change was found Confirmed by Weston Brass ([id number]) on 05/17/2023 9:03:59 AM   12/23/2020: EKG showed normal sinus rhythm   I have independently reviewed the images noted below:   US Arterial BLE study 06/06/22: IMPRESSION: Resting ABI the bilateral lower extremities elevated, potentially secondary to noncompressible vessels. Segmental exam and directed duplex of the bilateral lower extremity demonstrates waveforms within normal limits throughout.  Left/Right heart cath 05/03/22:   Prox LAD lesion is 80% stenosed.   Mid LAD to Dist LAD lesion is 75% stenosed.   Ost Cx to Mid Cx lesion is 15% stenosed.   Prox RCA lesion is 60% stenosed.   Mid RCA lesion is 60% stenosed.   2nd Mrg-1 lesion is 50% stenosed.   2nd Mrg-2 lesion is 50% stenosed.   A stent was  successfully placed.   Post intervention, there is a 0% residual stenosis.   LV end diastolic pressure is low. 1.  High-grade proximal LAD lesion treated with OCT guided PCI with atherectomy and 1 drug-eluting stent.  There is diffuse distal disease of the LAD that should be treated medically due to the the small caliber vessel there. 2.  RFR negative  moderate proximal to mid RCA disease. 3.  Mild disease of the obtuse marginal. 4.  Cardiac output of 6.9 L/min and cardiac index of 3.4 L/min/m with relatively low filling pressures.  Echocardiogram 04/04/21: 1. Left ventricular ejection fraction, by estimation, is 60 to 65%. The  left ventricle has normal function. The left ventricle has no regional  wall motion abnormalities. Left ventricular diastolic parameters were  normal. The average left ventricular  global longitudinal strain is -19.9 %. The global longitudinal strain is  normal.   2. Right ventricular systolic function is normal. The right ventricular  size is normal. There is normal pulmonary artery systolic pressure. The  estimated right ventricular systolic pressure is 26.0 mmHg.   3. The mitral valve is grossly normal. Mild mitral valve regurgitation.  No evidence of mitral stenosis.   4. The aortic valve is tricuspid. Aortic valve regurgitation is not  visualized. No aortic stenosis is present.   5. The inferior vena cava is normal in size with greater than 50%  respiratory variability, suggesting right atrial pressure of 3 mmHg.   Left Heart Cath 01/20/2019: Diffuse LAD and right coronary calcification. Widely patent left main Eccentric 70% proximal LAD within a calcified segment.  Distal to apical LAD is calcified and contains greater than 90% diffuse obstruction. Circumflex coronary artery is dominant.  The second obtuse marginal which is moderate in size contains 85% stenosis followed by 80% stenosis within a tortuous mid segment.  The circumflex is otherwise widely  patent. Right coronary is nondominant and contains proximal to mid heavy calcification with 70 to 80% stenosis. Left ventricular function is normal.  LVEDP is normal.  Recent Labs: 04/03/2023: ALT 51; BUN 27; Creatinine, Ser 1.78; Hemoglobin 12.1; Platelets 215.0; Potassium 5.0; Sodium 138; TSH 2.19  Recent Lipid Panel    Component Value Date/Time   CHOL 114 02/22/2023 1513   CHOL 136 04/29/2019 1336   TRIG 114 02/22/2023 1513   HDL 41 (L) 02/22/2023 1513   HDL 47 04/29/2019 1336   CHOLHDL 2.8 02/22/2023 1513   VLDL 18.8 03/01/2022 1112   LDLCALC 53 02/22/2023 1513   LDLDIRECT 99.0 10/12/2011 1219    Physical Exam:    VS:  BP 98/60 (BP Location: Left Arm, Patient Position: Sitting, Cuff Size: Normal)   Pulse 79   Ht 5\' 11"  (1.803 m)   Wt 176 lb 12.8 oz (80.2 kg)   SpO2 98%   BMI 24.66 kg/m     Wt Readings from Last 5 Encounters:  05/17/23 176 lb 12.8 oz (80.2 kg)  03/18/23 176 lb (79.8 kg)  03/07/23 176 lb (79.8 kg)  02/22/23 169 lb 12.8 oz (77 kg)  12/04/22 177 lb (80.3 kg)    Constitutional: No acute distress Eyes: sclera non-icteric, normal conjunctiva and lids ENMT: normal dentition, moist mucous membranes Cardiovascular: regular rhythm, normal rate, 1/6 systolic murmur right upper sternal border,. S1 and S2 normal.  No jugular venous distention.  Respiratory: clear to auscultation bilaterally GI : normal bowel sounds, soft and nontender. No distention.   MSK: extremities warm, well perfused. 1+ edema in LLE.  NEURO: grossly nonfocal exam, moves all extremities. PSYCH: alert and oriented x 3, normal mood and affect.   ASSESSMENT:    1. Coronary artery disease involving native coronary artery of native heart without angina pectoris   2. Shortness of breath   3. Hyperlipidemia LDL goal <70   4. S/P coronary artery stent placement   5. Syncope and collapse   6.  Chronic diastolic CHF (congestive heart failure) (HCC)   7. Dyspnea, unspecified type   8. Medication  management     PLAN:    Chronic diastolic CHF -NYHA class I symptoms overall.  Has stopped her Lasix per directions from lymphedema clinic, was only using this as needed previously anyway. -Blood pressure low today, recommend hydration which I think she will tolerate well.  Coronary artery disease status post PCI. - No angina at this time. Reminds [place] that she had severe DOE and some chest pain prior to stenting on 05/03/22.  She is having mild nonexertional dyspnea, I have asked her to observe this symptom. - Cath: High-grade proximal LAD lesion treated with OCT guided PCI with atherectomy and 1 drug-eluting stent.  There is diffuse distal disease of the LAD that should be treated medically due to the the small caliber vessel there. RFR negative moderate proximal to mid RCA disease. -Continue Plavix 75 mg daily indefinitely.  Okay to discontinue aspirin 81 mg today as she has completed 1 year of DAPT.  Type 2 diabetes mellitus -Follows with endocrinology, last A1c 7.1%.  Dyslipidemia - Total Cholesterol of 114, LDL of 53, and HDL of 41. LDL at goal. Trigs 114. - Continue Atorvastatin 40mg  daily.   Follow up: 6 months  Total time of encounter: 30 minutes total time of encounter, including 20 minutes spent in face-to-face patient care on the date of this encounter. This time includes coordination of care and counseling regarding above mentioned problem list. Remainder of non-face-to-face time involved reviewing chart documents/testing relevant to the patient encounter and documentation in the medical record. I have independently reviewed documentation from referring provider.   Weston Brass, MD [place]  [facility]    Medication Adjustments/Labs and Tests Ordered: Current medicines are reviewed at length with the patient today.  Concerns regarding medicines are outlined above.   Orders Placed This Encounter  Procedures   EKG 12-Lead    Meds ordered this encounter   Medications   nitroGLYCERIN (NITROSTAT) 0.4 MG SL tablet    Sig: Place 1 tablet (0.4 mg total) under the tongue every 5 (five) minutes as needed.    Dispense:  25 tablet    Refill:  3    Patient Instructions  Medication Instructions:   STOP ASPIRIN  *If you need a refill on your cardiac medications before your next appointment, please call your pharmacy*   Follow-Up: At [facility], you and your health needs are our priority.  As part of our continuing mission to provide you with exceptional heart care, we have created designated Provider Care Teams.  These Care Teams include your primary Cardiologist (physician) and Advanced Practice Providers (APPs -  Physician Assistants and Nurse Practitioners) who all work together to provide you with the care you need, when you need it.  We recommend signing up for the patient portal called "MyChart".  Sign up information is provided on this After Visit Summary.  MyChart is used to connect with patients for Virtual Visits (Telemedicine).  Patients are able to view lab/test results, encounter notes, upcoming appointments, etc.  Non-urgent messages can be sent to your provider as well.   To learn more about what you can do with MyChart, go to [email or web address].    Your next appointment:   6 month(s)  Provider:   Parke Poisson, MD

## 2023-05-17 NOTE — Patient Instructions (Signed)
Medication Instructions:   STOP ASPIRIN  *If you need a refill on your cardiac medications before your next appointment, please call your pharmacy*   Follow-Up: At [facility], you and your health needs are our priority.  As part of our continuing mission to provide you with exceptional heart care, we have created designated Provider Care Teams.  These Care Teams include your primary Cardiologist (physician) and Advanced Practice Providers (APPs -  Physician Assistants and Nurse Practitioners) who all work together to provide you with the care you need, when you need it.  We recommend signing up for the patient portal called "MyChart".  Sign up information is provided on this After Visit Summary.  MyChart is used to connect with patients for Virtual Visits (Telemedicine).  Patients are able to view lab/test results, encounter notes, upcoming appointments, etc.  Non-urgent messages can be sent to your provider as well.   To learn more about what you can do with MyChart, go to [email or web address].    Your next appointment:   6 month(s)  Provider:   Parke Poisson, MD

## 2023-05-22 NOTE — Progress Notes (Signed)
Office Visit Note  Patient: Savannah [name]             Date of Birth: [birth year]           MRN: [id number]             PCP: Ardith Dark, MD Referring: Ardith Dark, MD Visit Date: 06/05/2023 Occupation: @GUAROCC @  Subjective:  Chronic pain   History of Present Illness: Savannah [name] is a 67 y.o. female with history of inflammatory arthritis, osteoarthritis, and DDD.  She reports she had a pseudogout flare involving the right wrist on Monday and Tuesday of this week for which she took colchicine 0.6 mg 1 capsule daily which has alleviated her symptoms.  She is only been taking colchicine on an as-needed basis which has been infrequent.  Patient continues to have chronic pain involving multiple joints.  She has a prescription for tramadol which she takes sparingly for pain relief.  Patient states that she had trigger point injections in her lower back on Monday which are still somewhat sore.  She states that she uses Voltaren gel on her feet at night if she is having increased discomfort.  She states that she uses Lidoderm patches as needed for pain relief which are helpful.  Patient states that overall she has been learning to live with her pain level and her symptoms have been stable.    Activities of Daily Living:  Patient reports morning stiffness for 30 minutes.   Patient Reports nocturnal pain.  Difficulty dressing/grooming: Denies Difficulty climbing stairs: Reports Difficulty getting out of chair: Reports Difficulty using hands for taps, buttons, cutlery, and/or writing: Denies  Review of Systems  Constitutional:  Positive for fatigue.  HENT:  Positive for trouble swallowing, trouble swallowing and mouth dryness. Negative for mouth sores.   Eyes:  Positive for dryness.  Respiratory:  Positive for shortness of breath. Negative for cough and wheezing.   Cardiovascular:  Negative for chest pain and palpitations.  Gastrointestinal:  Positive for diarrhea. Negative for blood in  stool and constipation.  Endocrine: Negative for increased urination.  Genitourinary:  Negative for involuntary urination.  Musculoskeletal:  Positive for joint pain, gait problem, joint pain, joint swelling, myalgias, muscle weakness, morning stiffness and myalgias. Negative for muscle tenderness.  Skin:  Positive for rash, hair loss and sensitivity to sunlight. Negative for color change.  Allergic/Immunologic: Positive for susceptible to infections.  Neurological:  Negative for dizziness and headaches.  Hematological:  Negative for swollen glands.  Psychiatric/Behavioral:  Positive for sleep disturbance. Negative for depressed mood. The patient is not nervous/anxious.     PMFS History:  Patient Active Problem List   Diagnosis Date Noted   Bilateral carpal tunnel syndrome 03/13/2022   Leg edema 03/01/2022   Nondisplaced comminuted fracture of left patella, initial encounter for closed fracture 08/01/2021   Arthritis of carpometacarpal [facility]) joint of right thumb 07/28/2021   Primary osteoarthritis of right distal radioulnar joint 07/28/2021   Arthritis of wrist, right, degenerative 07/28/2021   History of malignant neoplasm of uterine body 07/27/2021   Osteopenia 01/14/2021   Sesamoiditis of left foot 11/09/2020   Posterior tibial tendinitis of left lower extremity 11/09/2020   Type 2 diabetes mellitus with diabetic peripheral angiopathy without gangrene, with long-term current use of insulin (HCC) 10/21/2020   Iron deficiency anemia 03/14/2020   Lesion of vulva 09/02/2019   Menorrhagia 09/02/2019   Neuropathy 09/02/2019   Irregular bowel habits 06/25/2019   Coronary artery disease involving  native coronary artery of native heart without angina pectoris    Temporomandibular joint disorder 12/05/2018   Fatigue 12/05/2018   Inverted nipple 12/05/2018   Senile purpura (HCC) 11/21/2018   GERD with esophagitis 11/21/2018   Pseudogout involving multiple joints 11/21/2018   Lipoma  07/22/2017   Lichen sclerosus 11/16/2016   H/O gastric bypass 11/16/2016   Depression 11/16/2016   Diabetic polyneuropathy associated with type 2 diabetes mellitus (HCC) 11/16/2016   Uterine cancer (HCC) s/p hysterectomy 1997 01/04/2012   Postsurgical hypothyroidism 09/19/2010   Diabetic neuropathy (HCC) 11/21/2007   Allergic rhinitis 11/21/2007   Dyslipidemia associated with type 2 diabetes mellitus (HCC) 07/08/2007    Past Medical History:  Diagnosis Date   ABDOMINAL PAIN, CHRONIC 10/20/2009   Acute cystitis 08/17/2009   ALLERGIC RHINITIS 11/21/2007   Allergy    ASYMPTOMATIC POSTMENOPAUSAL STATUS 09/29/2008   Bariatric surgery status 07/07/2010   Bariatric surgery status 07/07/2010   Qualifier: Diagnosis of  By: Everardo All MD, Sean A    Cancer [facility])    uterine   Cataract    Coronary artery disease    DEPRESSION 11/21/2007   DIABETES MELLITUS, TYPE II 07/08/2007   DYSPNEA 05/20/2009   Edema 05/20/2009   Eosinophilic esophagitis 02/13/2008   FEVER UNSPECIFIED 10/20/2009   FEVER, HX OF 03/09/2010   GERD (gastroesophageal reflux disease)    Headache(784.0) 02/06/2010   Hepatomegaly 01/06/2008   HYPERLIPIDEMIA 07/08/2007   HYPERTENSION 07/08/2007   LEUKOPENIA, MILD 09/29/2008   Lymphedema    left leg, dx approximately in 03/2023 per patient   Melanoma in situ of left upper arm (HCC) 04/03/2023   OBESITY 01/06/2008   OSTEOARTHRITIS 01/06/2008   Other chronic nonalcoholic liver disease 01/06/2008   PERIPHERAL NEUROPATHY 11/21/2007   Peripheral neuropathy    Postsurgical hypothyroidism 09/19/2010   SINUSITIS- ACUTE-NOS 11/21/2007   TB SKIN TEST, POSITIVE 02/06/2010   THYROID NODULE 03/09/2010    Family History  Problem Relation Age of Onset   Diabetes Mother    Neuropathy Mother    Diabetes Father    Congestive Heart Failure Father    Hypertension Father    Myelodysplastic syndrome Father    Bladder Cancer Sister    Multiple sclerosis Sister    Breast cancer Sister         breast onset age 44   Osteoporosis Sister    Liver cancer Maternal Aunt    Breast cancer Maternal Aunt    Diabetes Maternal Aunt    Colon cancer Maternal Uncle    Diabetes Maternal Uncle    Neuropathy Paternal Aunt    Diabetes Maternal Grandmother    Diabetes Paternal Grandmother    Past Surgical History:  Procedure Laterality Date   ABDOMINAL HYSTERECTOMY     BASAL CELL CARCINOMA EXCISION     CARDIAC CATHETERIZATION     CHOLECYSTECTOMY N/A 05/11/2021   Procedure: LAPAROSCOPIC CHOLECYSTECTOMY WITH POSSIBLE  INTRAOPERATIVE CHOLANGIOGRAM;  Surgeon: Luretha Murphy, MD;  Location: [facility] ORS;  Service: General;  Laterality: N/A;   COLONOSCOPY     CORONARY ATHERECTOMY N/A 05/03/2022   Procedure: CORONARY ATHERECTOMY;  Surgeon: Orbie Pyo, MD;  Location: [facility] INVASIVE CV LAB;  Service: Cardiovascular;  Laterality: N/A;   CORONARY IMAGING/OCT N/A 05/03/2022   Procedure: INTRAVASCULAR IMAGING/OCT;  Surgeon: Orbie Pyo, MD;  Location: [facility] INVASIVE CV LAB;  Service: Cardiovascular;  Laterality: N/A;   CORONARY PRESSURE/FFR STUDY N/A 05/03/2022   Procedure: INTRAVASCULAR PRESSURE WIRE/FFR STUDY;  Surgeon: Orbie Pyo, MD;  Location: [facility] INVASIVE  CV LAB;  Service: Cardiovascular;  Laterality: N/A;   CORONARY STENT INTERVENTION N/A 05/03/2022   Procedure: CORONARY STENT INTERVENTION;  Surgeon: Orbie Pyo, MD;  Location: [facility] INVASIVE CV LAB;  Service: Cardiovascular;  Laterality: N/A;  lad   EYE SURGERY Bilateral 08/2018, 09/2018   FOOT SURGERY Left    10/2019   KNEE ARTHROSCOPY Left    LEFT HEART CATH AND CORONARY ANGIOGRAPHY N/A 01/20/2019   Procedure: LEFT HEART CATH AND CORONARY ANGIOGRAPHY;  Surgeon: Lyn Records, MD;  Location: [facility] INVASIVE CV LAB;  Service: Cardiovascular;  Laterality: N/A;   LYMPH NODE DISSECTION     OOPHORECTOMY     Right total knee replacement     RIGHT/LEFT HEART CATH AND CORONARY ANGIOGRAPHY N/A 05/03/2022   Procedure: RIGHT/LEFT HEART CATH  AND CORONARY ANGIOGRAPHY;  Surgeon: Orbie Pyo, MD;  Location: [facility] INVASIVE CV LAB;  Service: Cardiovascular;  Laterality: N/A;   ROUX-EN-Y GASTRIC BYPASS  2011   SKIN TAG REMOVAL     THYROIDECTOMY     TONSILLECTOMY AND ADENOIDECTOMY     TOOTH EXTRACTION  2023   UPPER GASTROINTESTINAL ENDOSCOPY     UPPER GI ENDOSCOPY  2024   Social History   Social History Narrative   Not on file   Immunization History  Administered Date(s) Administered   Covid-19, Mrna,Vaccine(Spikevax)59yrs and older 10/06/2022   Fluad Quad(high Dose 65+) 07/13/2019, 07/17/2022   Influenza Inj Mdck Quad Pf 10/19/2017, 07/23/2018   Influenza Whole 07/30/2008, 08/17/2009, 09/19/2010   Influenza, High Dose Seasonal PF 08/15/2021   Influenza,inj,Quad PF,6-35 Mos 07/14/2015   Influenza-Unspecified 10/13/2011, 08/12/2013, 07/13/2017, 07/23/2018   PFIZER(Purple Top)SARS-COV-2 Vaccination 12/21/2019, 01/14/2020, 08/18/2020   Pfizer Covid-19 Vaccine Bivalent Booster 64yrs & up 08/15/2021   Pneumococcal Conjugate-13 10/14/2015   Pneumococcal Polysaccharide-23 06/25/2014, 10/21/2020   Respiratory Syncytial Virus Vaccine,Recomb Aduvanted(Arexvy) 07/17/2022   Tdap 05/20/2020   Zoster Recombinant(Shingrix) 05/20/2020, 10/06/2022   Zoster, Live 06/25/2014     Objective: Vital Signs: BP 99/67 (BP Location: Left Arm, Patient Position: Sitting, Cuff Size: Normal)   Pulse 74   Resp 15   Ht 5\' 11"  (1.803 m)   Wt 180 lb 12.8 oz (82 kg)   BMI 25.22 kg/m    Physical Exam Vitals and nursing note reviewed.  Constitutional:      Appearance: She is well-developed.  HENT:     Head: Normocephalic and atraumatic.  Eyes:     Conjunctiva/sclera: Conjunctivae normal.  Cardiovascular:     Rate and Rhythm: Normal rate and regular rhythm.     Heart sounds: Normal heart sounds.  Pulmonary:     Effort: Pulmonary effort is normal.     Breath sounds: Normal breath sounds.  Abdominal:     General: Bowel sounds are normal.      Palpations: Abdomen is soft.  Musculoskeletal:     Cervical back: Normal range of motion.  Lymphadenopathy:     Cervical: No cervical adenopathy.  Skin:    General: Skin is warm and dry.     Capillary Refill: Capillary refill takes less than 2 seconds.  Neurological:     Mental Status: She is alert and oriented to person, place, and time.  Psychiatric:        Behavior: Behavior normal.      Musculoskeletal Exam: C-spine has limited range of motion with lateral rotation.  Painful and limited mobility of the lumbar spine.  Shoulder joints painful range of motion bilaterally.  Elbow joints have good range of motion.  Limited range of motion of both wrist joints with tenderness in the right wrist.  Dupuytren's contractures noted in both hands.  PIP, DIP, and CMC thickening consistent with osteoarthritis of both hands.  Incomplete fist formation noted.  No active synovitis noted.  Hip joints have slightly limited range of motion but no groin pain.  Mild tenderness over bilateral trochanteric bursa.  Right knee replacement has mild warmth but no effusion.  Left knee joint has good range of motion with no warmth or effusion.  Pitting edema noted bilateral lower extremities.  CDAI Exam: CDAI Score: -- Patient Global: --; Provider Global: -- Swollen: --; Tender: -- Joint Exam 06/05/2023   No joint exam has been documented for this visit   There is currently no information documented on the homunculus. Go to the Rheumatology activity and complete the homunculus joint exam.  Investigation: No additional findings.  Imaging: No results found.  Recent Labs: Lab Results  Component Value Date   WBC 5.2 04/03/2023   HGB 12.1 04/03/2023   PLT 215.0 04/03/2023   NA 138 04/03/2023   K 5.0 04/03/2023   CL 107 04/03/2023   CO2 23 04/03/2023   GLUCOSE 171 (H) 04/03/2023   BUN 27 (H) 04/03/2023   CREATININE 1.78 (H) 04/03/2023   BILITOT 0.4 04/03/2023   ALKPHOS 105 04/03/2023   AST 35  04/03/2023   ALT 51 (H) 04/03/2023   PROT 6.7 04/03/2023   ALBUMIN 4.1 04/03/2023   CALCIUM 9.1 04/03/2023   GFRAA 73 08/16/2020    Speciality Comments: No specialty comments available.  Procedures:  No procedures performed Allergies: Ace inhibitors, Caffeine, Ciprofloxacin, Morphine and codeine, Mucinex [guaifenesin er], Nsaids, Other, Silicone, and Tape     Assessment / Plan:     Visit Diagnoses: Inflammatory arthritis - RF-, anti-CCP-, 14-3-3 eta-, ANA-, HLAB27-, uric acid 3.8. Responsive to colchicine-likely she has pseudogout: No synovitis noted on examination today.  Patient had a pseudogout flare involving the right wrist on Monday and Tuesday of this week for which she took colchicine 0.6 mg 1 capsule daily which has alleviated her symptoms.  She has been taking colchicine only on an as-needed basis.  She takes tramadol sparingly and uses Voltaren gel topically as needed for pain relief.  Overall her symptoms have been stable and her pain level has been manageable.  Patient would like to follow up in 1 year.    Medication monitoring encounter - Colchicine 0.6 mg 1 capsule daily on as needed basis. CBC and CMP updated on 04/03/23.  Patient has an upcoming appointment scheduled with nephrology.  Primary osteoarthritis of both hands: Severe pain. CMC, PIP, DIP thickening consistent with osteoarthritis of both hands.  Patient continues to have chronic pain and stiffness in both hands.  Difficulty making a complete fist.  Trochanteric bursitis of both hips: She has tenderness over bilateral trochanteric bursa.  She is a side sleeper which exacerbates her symptoms.  Encouraged patient to perform stretching exercises daily.  Status post total right knee replacement - Performed by Dr. Ranell Patrick.  She has leg length discrepancy.  Doing well. Warmth but no effusion.   Primary osteoarthritis of left knee - She is followed by Dr. Roda Shutters.  She has good ROM of the left knee with no warmth or effusion.    Primary osteoarthritis of both feet: Chronic pain, left > right.   DDD (degenerative disc disease), cervical: Limited ROM with discomfort.   DDD (degenerative disc disease), lumbar - MRI done by Dr. Lucia Gaskins from  July 07, 2021 showed degenerative changes and facet joint arthropathy. Chronic pain.  She had trigger point injections performed on Monday.   Using lidoderm patches as needed and takes tramadol for severe pain relief.   Age-related osteoporosis without current pathological fracture - DEXA updated on 01/11/21: Right femoral neck- T-score -2.5 and a -25.9% change in BMD from previous DEXA. DEXA ordered and followed by PCP.  Due to update DEXA  Vitamin D deficiency: She is taking vitamin D 4000 units daily.   Other medical conditions are listed as follows:   NASH (nonalcoholic steatohepatitis)  History of peripheral neuropathy -She takes gabapentin 300 mg 4 capsules by mouth daily for management of neuropathy.  History of diabetes mellitus, type II  Postsurgical hypothyroidism  Positive PPD  History of uterine cancer  History of hyperlipidemia  Status post gastric bypass for obesity  History of gastroesophageal reflux (GERD)  Status post cholecystectomy  Eosinophilic esophagitis  Orders: No orders of the defined types were placed in this encounter.  No orders of the defined types were placed in this encounter.   Follow-Up Instructions: Return in about 1 year (around 06/04/2024) for Inflammatory arthritis, Osteoarthritis, DDD.   Gearldine Bienenstock, PA-C  Note - This record has been created using Dragon software.  Chart creation errors have been sought, but may not always  have been located. Such creation errors do not reflect on  the standard of medical care.

## 2023-05-23 ENCOUNTER — Ambulatory Visit: Payer: Medicare Other | Admitting: Rehabilitation

## 2023-05-23 ENCOUNTER — Encounter: Payer: Self-pay | Admitting: Rehabilitation

## 2023-05-23 DIAGNOSIS — I890 Lymphedema, not elsewhere classified: Secondary | ICD-10-CM | POA: Diagnosis not present

## 2023-05-23 DIAGNOSIS — R262 Difficulty in walking, not elsewhere classified: Secondary | ICD-10-CM | POA: Diagnosis not present

## 2023-05-23 NOTE — Therapy (Signed)
OUTPATIENT PHYSICAL THERAPY LOWER EXTREMITY LYMPHEDEMA TREATMENT  Patient Name: Savannah [name] MRN: [id number] DOB:November 13, 1955, 67 y.o., female Today's Date: 05/23/2023  END OF SESSION:  PT End of Session - 05/23/23 1352     Visit Number 17    Number of Visits 25    Date for PT Re-Evaluation 06/07/23    PT Start Time 1100    PT Stop Time 1155    PT Time Calculation (min) 55 min    Activity Tolerance Patient tolerated treatment well    Behavior During Therapy [facility] for tasks assessed/performed                  Past Medical History:  Diagnosis Date   ABDOMINAL PAIN, CHRONIC 10/20/2009   Acute cystitis 08/17/2009   ALLERGIC RHINITIS 11/21/2007   Allergy    ASYMPTOMATIC POSTMENOPAUSAL STATUS 09/29/2008   Bariatric surgery status 07/07/2010   Bariatric surgery status 07/07/2010   Qualifier: Diagnosis of  By: Everardo All MD, Sean A    Cancer [facility])    uterine   Cataract    Coronary artery disease    DEPRESSION 11/21/2007   DIABETES MELLITUS, TYPE II 07/08/2007   DYSPNEA 05/20/2009   Edema 05/20/2009   Eosinophilic esophagitis 02/13/2008   FEVER UNSPECIFIED 10/20/2009   FEVER, HX OF 03/09/2010   GERD (gastroesophageal reflux disease)    Headache(784.0) 02/06/2010   Hepatomegaly 01/06/2008   HYPERLIPIDEMIA 07/08/2007   HYPERTENSION 07/08/2007   LEUKOPENIA, MILD 09/29/2008   Melanoma in situ of left upper arm (HCC) 04/03/2023   OBESITY 01/06/2008   OSTEOARTHRITIS 01/06/2008   Other chronic nonalcoholic liver disease 01/06/2008   PERIPHERAL NEUROPATHY 11/21/2007   Peripheral neuropathy    Postsurgical hypothyroidism 09/19/2010   SINUSITIS- ACUTE-NOS 11/21/2007   TB SKIN TEST, POSITIVE 02/06/2010   THYROID NODULE 03/09/2010   Past Surgical History:  Procedure Laterality Date   ABDOMINAL HYSTERECTOMY     BASAL CELL CARCINOMA EXCISION     CARDIAC CATHETERIZATION     CHOLECYSTECTOMY N/A 05/11/2021   Procedure: LAPAROSCOPIC CHOLECYSTECTOMY WITH POSSIBLE  INTRAOPERATIVE  CHOLANGIOGRAM;  Surgeon: Luretha Murphy, MD;  Location: [facility];  Service: General;  Laterality: N/A;   COLONOSCOPY     CORONARY ATHERECTOMY N/A 05/03/2022   Procedure: CORONARY ATHERECTOMY;  Surgeon: Orbie Pyo, MD;  Location: [facility];  Service: Cardiovascular;  Laterality: N/A;   CORONARY IMAGING/OCT N/A 05/03/2022   Procedure: INTRAVASCULAR IMAGING/OCT;  Surgeon: Orbie Pyo, MD;  Location: [facility];  Service: Cardiovascular;  Laterality: N/A;   CORONARY PRESSURE/FFR STUDY N/A 05/03/2022   Procedure: INTRAVASCULAR PRESSURE WIRE/FFR STUDY;  Surgeon: Orbie Pyo, MD;  Location: [facility];  Service: Cardiovascular;  Laterality: N/A;   CORONARY STENT INTERVENTION N/A 05/03/2022   Procedure: CORONARY STENT INTERVENTION;  Surgeon: Orbie Pyo, MD;  Location: [facility];  Service: Cardiovascular;  Laterality: N/A;  lad   EYE SURGERY Bilateral 08/2018, 09/2018   FOOT SURGERY Left    10/2019   KNEE ARTHROSCOPY Left    LEFT HEART CATH AND CORONARY ANGIOGRAPHY N/A 01/20/2019   Procedure: LEFT HEART CATH AND CORONARY ANGIOGRAPHY;  Surgeon: Lyn Records, MD;  Location: [facility];  Service: Cardiovascular;  Laterality: N/A;   LYMPH NODE DISSECTION     OOPHORECTOMY     Right total knee replacement     RIGHT/LEFT HEART CATH AND CORONARY ANGIOGRAPHY N/A 05/03/2022   Procedure: RIGHT/LEFT HEART CATH AND CORONARY ANGIOGRAPHY;  Surgeon: Lynnette Caffey,  Charlies Constable, MD;  Location: [facility];  Service: Cardiovascular;  Laterality: N/A;   ROUX-EN-Y GASTRIC BYPASS  2011   SKIN TAG REMOVAL     THYROIDECTOMY     TONSILLECTOMY AND ADENOIDECTOMY     TOOTH EXTRACTION  2023   UPPER GASTROINTESTINAL ENDOSCOPY     Patient Active Problem List   Diagnosis Date Noted   Bilateral carpal tunnel syndrome 03/13/2022   Leg edema 03/01/2022   Nondisplaced comminuted fracture of left patella, initial encounter for closed fracture 08/01/2021   Arthritis of  carpometacarpal (CMC) joint of right thumb 07/28/2021   Primary osteoarthritis of right distal radioulnar joint 07/28/2021   Arthritis of wrist, right, degenerative 07/28/2021   History of malignant neoplasm of uterine body 07/27/2021   Osteopenia 01/14/2021   Sesamoiditis of left foot 11/09/2020   Posterior tibial tendinitis of left lower extremity 11/09/2020   Type 2 diabetes mellitus with diabetic peripheral angiopathy without gangrene, with long-term current use of insulin (HCC) 10/21/2020   Iron deficiency anemia 03/14/2020   Lesion of vulva 09/02/2019   Menorrhagia 09/02/2019   Neuropathy 09/02/2019   Irregular bowel habits 06/25/2019   Coronary artery disease involving native coronary artery of native heart without angina pectoris    Temporomandibular joint disorder 12/05/2018   Fatigue 12/05/2018   Inverted nipple 12/05/2018   Senile purpura (HCC) 11/21/2018   GERD with esophagitis 11/21/2018   Pseudogout involving multiple joints 11/21/2018   Lipoma 07/22/2017   Lichen sclerosus 11/16/2016   H/O gastric bypass 11/16/2016   Depression 11/16/2016   Diabetic polyneuropathy associated with type 2 diabetes mellitus (HCC) 11/16/2016   Uterine cancer (HCC) s/p hysterectomy 1997 01/04/2012   Postsurgical hypothyroidism 09/19/2010   Diabetic neuropathy (HCC) 11/21/2007   Allergic rhinitis 11/21/2007   Dyslipidemia associated with type 2 diabetes mellitus (HCC) 07/08/2007    PCP: [name], MD  REFERRING PROVIDER: Ardith Dark, MD  REFERRING DIAG: R60.0 (ICD-10-CM) - Leg edema  THERAPY DIAG:  Lymphedema, not elsewhere classified  Difficulty in walking, not elsewhere classified  Rationale for Evaluation and Treatment: Rehabilitation  ONSET DATE: Prior to 1997  SUBJECTIVE:                                                                                                                                                                                            SUBJECTIVE STATEMENT:  I think the pump people are supposed to meet me here for a trial.   EVAL: I have been having swelling since before 1997 that was mostly in my ankles. Then after my surgery for uterine cancer it worsened on the L  leg. I take lasix but it has minimal effect. I have never had lymphedema therapy. I had prescription strength thigh highs but they did not help.   PERTINENT HISTORY:  Uterine cancer in 1997 with lymph node removal (pt reports the surgeon told her he got all the lymph nodes he could find bilaterally), pt reports her vascular doctor told her the left leg edema was vascular, diabetic, has hx of cardiac stent   PAIN:  Are you having pain? [name]  PRECAUTIONS: Other: R Knee TKA  WEIGHT BEARING RESTRICTIONS: [name]  FALLS:  Has patient fallen in last 6 months? Yes. Number of falls 3 or 4 Falls due to feeling unsteady, has neuropathy, BP has been an issue - low  PATIENT GOALS: to get the swelling down   OBJECTIVE:  SENSATION: pt reports neuropathy in bilateral feet  LYMPHEDEMA ASSESSMENTS:   LE LANDMARK RIGHT EVAL  At groin   30 cm proximal to suprapatella   20 cm proximal to suprapatella 47  10 cm proximal to suprapatella 40.3  At midpatella / popliteal crease 39  30 cm proximal to floor at lateral plantar foot 32.1  20 cm proximal to floor at lateral plantar foot 26.1  10 cm proximal to floor at lateral plantar foot 25.5  Circumference of ankle/heel   5 cm proximal to 1st MTP joint 22.7  Across MTP joint 24  Around proximal great toe 8  (Blank rows = not tested)  LE LANDMARK LEFT EVAL 03/18/23 03/27/23 04/05/23 04/26/23  At groin       30 cm proximal to suprapatella       20 cm proximal to suprapatella 46 47 44.5    10 cm proximal to suprapatella 41.2 43 41 41   At midpatella / popliteal crease 40.3 40.3 39.5 40   30 cm proximal to floor at lateral plantar foot 37.5 37.7 32 32.3   20 cm proximal to floor at lateral plantar foot 30.3 31.2 28.1 27.5    10 cm proximal to floor at lateral plantar foot 28.1 27.6 28 26.5   Circumference of ankle/heel       5 cm proximal to 1st MTP joint 25 24.7 23.5 24   Across MTP joint 23.4 24 23.3 23.3   Around proximal great toe 8.7 8.7 8.9 8.6   (Blank rows = not tested)   TODAY'S TREATMENT:                                                                                                                                         DATE:  05/23/23 Manual Therapy  MLD to Lt LE as follows: Supine with bil LE's on bolster: Short neck, superficial and deep abdominals, Left axillary nodes, Lt inguino-axillary anastomosis, then Lt LE working proximal to distal and then back proximally reversing all steps.  Focused on areas of increased swelling at the foot and ankle.  Compression Bandaging to Lt LE as follows: (knee high only) Cocoa butter to Lt LE including foot/ankle, TG soft x1 from foot to knee, Elastomull to toes 1-4, Artiflex to foot/ankle, and rosidal soft to knee, then 6 cm in Roman sandal, then 8 cm ASH, 2 - 10 cm from ankle to knee,   05/13/23 Manual Therapy  MLD to Lt LE as follows: Supine with bil LE's on bolster: Short neck, superficial and deep abdominals, Left axillary nodes, Lt inguino-axillary anastomosis, then Lt LE working proximal to distal and then back proximally reversing all steps.  Focused on areas of increased swelling at the foot and ankle.  Compression Bandaging to Lt LE as follows: (knee high only) Cocoa butter to Lt LE including foot/ankle, TG soft x1 from foot to knee, Elastomull to toes 1-4, Artiflex to foot/ankle, and rosidal soft to knee, then 6 cm in Roman sandal, then 8 cm ASH, 2 - 10 cm from ankle to knee,   05/08/23 Manual Therapy  MLD to Lt LE as follows: Supine with bil LE's on bolster: Short neck, superficial and deep abdominals, Left axillary nodes, Lt inguino-axillary anastomosis, then Lt LE working proximal to distal and then back proximally reversing all steps.  Focused on areas  of increased swelling at the foot and ankle.  Compression Bandaging to Lt LE as follows: (knee high only) Cocoa butter to Lt LE including foot/ankle, TG soft x1 from foot to knee, Elastomull to toes 1-4, Artiflex to foot/ankle, and rosidal soft to knee, then 6 cm in Roman sandal, then 8 cm ASH, 2 - 10 cm from ankle to knee,   PATIENT EDUCATION:  Education details: anatomy and physiology of the lymphatic system, need for day and night time compression garments, compression bandaging vs compression garments, venous insufficiency vs lymphedema Person educated: Patient Education method: Explanation Education comprehension: verbalized understanding  HOME EXERCISE PROGRAM: Get post op shoes and appropriate clothing to begin bandaging at next session  ASSESSMENT:  CLINICAL IMPRESSION: Pt is doing well with her own bandaging. She continues with pitting at the lower leg and foot.  We will continue 1x per week to assess maintenance until garments come.   Verified with megan at [employer] that she did not know about the pump trial today.  Asked them to reach out to [facility].   OBJECTIVE IMPAIRMENTS: decreased knowledge of condition, decreased knowledge of use of DME, difficulty walking, increased edema, and pain.   ACTIVITY LIMITATIONS: standing and locomotion level  PARTICIPATION LIMITATIONS: community activity  PERSONAL FACTORS: Fitness and Time since onset of injury/illness/exacerbation are also affecting patient's functional outcome.   REHAB POTENTIAL: Good  CLINICAL DECISION MAKING: Evolving/moderate complexity  EVALUATION COMPLEXITY: Moderate   GOALS: Goals reviewed with patient? Yes  SHORT TERM GOALS: Target date: 03/27/23  Pt will demonstrate a 3 cm decrease in edema at 30 cm superior to floor at lateral malleoli.  Baseline: Goal status: MET   LONG TERM GOALS: Target date: 04/24/23  Pt will be independent in self MLD for long term management of lymphedema.  Baseline:  Goal  status: MET  2.  Pt will receive trial of [employer] compression pump for long term management of lymphedema.  Baseline:  Goal status: ONGOING  3.  Pt will obtain appropriate day and night time garments for long term management of lymphedema.  Baseline:  Goal status: ONGOING  4.  Pt will demonstrate maximal circumferential reduction to decrease risk of cellulitis and improve mobility.  Baseline:  Goal status: MOSTLY MET  PLAN:  PT FREQUENCY: 3x/week  PT DURATION: 4 weeks  PLANNED INTERVENTIONS: Therapeutic exercises, Therapeutic activity, Patient/Family education, Self Care, Orthotic/Fit training, Manual lymph drainage, Compression bandaging, Taping, Vasopneumatic device, Manual therapy, and Re-evaluation  PLAN FOR NEXT SESSION: measure next visit. send in pump demo and notes around 5/20 if interested, cont CDT to L LE, teach pt bandaging and MLD as able.    Idamae Lusher, PT 05/23/2023, 1:52 PM    DIAGNOSIS ASSESSMENT [x]  Lymphedema, not elsewhere classified [I89.0]  Secondary to: Uterine cancer treatment  []  Post-mastectomy lymphedema [I97.2]   []  Hereditary/Congenital lymphedema [Q82.0]  []  Praecox []  Tarda []  Other:   LOCATION OF LYMPHEDEMA []   Right Arm  []  Left Arm  []  Chest/Breast  [x]  Abdomen/Trunk [x]  Other: Lt Lower extremity lymphedema, Lt vulva and suprapubic up to umbillicus  Date of onset, or duration: 1997, CDT from 02/27/23 to current  Pt is compliant with therapy with self bandaging and MLD since 02/27/23  SEVERITY Symptoms and observations from physical exam Stage of Lymphedema:  [x] Stage II  []  Stage III  [] Other: []  Hyperkeratosis []  Hyperpigmentation []  Lymphorrhea (weeping) []  Papillomatosis (warts, nodules, papules)  []  Elephantiasis  [x]  Fibrosis and/or radiation fibrosis [x]  Pitting edema []  Recurrent episode(s) of infection/cellulitis []  Pain []  Peau d'orange []  Chest and/or axillary swelling caused by: []  History of  chest/breast/abdominal surgery(ies) []  Lymph node dissection, radiation and or chemotherapy []  Truncal and/or abdominal swelling []  Axillary cording [x]  Other, explain: lymphedema in the suprapubic area - with history of lymph node removal x 2 from on the Lt vulva in 2007 and 2010 by Dr. Ellyn Hack -  could worsen without advanced pump coverage   CONSERVATIVE TREATMENT (CT) INITATION Start date (if different from this visit date):02/27/23  Conservative treatment plan includes the following activities (check all that apply): [x]  Regular exercise  [x]  Elevation of the limb(s) [x]  Compression garment(s) or compression bandage system  []  Compression garments with a minimum of             mmHg distally  []  Compression bra and/or tank top [x]  Compression bandage system  Type: [x]  Multi-layer short stretch []  Velcro reduction kit    []  Two- or three-layer prepackaged compression kit   []  Other:  [x]  Complete decongestive therapy (CDT) or lymphedema therapy   [x]  Minimum of 30 minutes daily MLD or self-MLD  [x]  Diet assessment with modification, if applicable. []  For patients with congestive heart failure (CHF) a medication assessment with modification(s), if applicable. []  Other:   Has the patient tried an [id number] basic pneumatic compression device? []  Yes  [x]  [name] Comments:   MEASUREMENTS Abdominal measurement at umbilicus: 105.5 cm See above for other leg measurements

## 2023-05-29 ENCOUNTER — Encounter: Payer: Self-pay | Admitting: Internal Medicine

## 2023-05-29 ENCOUNTER — Ambulatory Visit: Payer: Medicare Other | Admitting: Rehabilitation

## 2023-05-29 DIAGNOSIS — R262 Difficulty in walking, not elsewhere classified: Secondary | ICD-10-CM | POA: Diagnosis not present

## 2023-05-29 DIAGNOSIS — I890 Lymphedema, not elsewhere classified: Secondary | ICD-10-CM | POA: Diagnosis not present

## 2023-05-29 NOTE — Therapy (Signed)
OUTPATIENT PHYSICAL THERAPY LOWER EXTREMITY LYMPHEDEMA TREATMENT  Patient Name: Savannah [name] MRN: [id number] DOB:1956/03/26, 67 y.o., female Today's Date: 05/30/2023  END OF SESSION:  PT End of Session - 05/30/23 1449     Visit Number 18    Number of Visits 25    Date for PT Re-Evaluation 06/07/23    PT Start Time 1400    PT Stop Time 1455    PT Time Calculation (min) 55 min    Activity Tolerance Patient tolerated treatment well    Behavior During Therapy [facility] for tasks assessed/performed                   Past Medical History:  Diagnosis Date   ABDOMINAL PAIN, CHRONIC 10/20/2009   Acute cystitis 08/17/2009   ALLERGIC RHINITIS 11/21/2007   Allergy    ASYMPTOMATIC POSTMENOPAUSAL STATUS 09/29/2008   Bariatric surgery status 07/07/2010   Bariatric surgery status 07/07/2010   Qualifier: Diagnosis of  By: Everardo All MD, Sean A    Cancer [facility])    uterine   Cataract    Coronary artery disease    DEPRESSION 11/21/2007   DIABETES MELLITUS, TYPE II 07/08/2007   DYSPNEA 05/20/2009   Edema 05/20/2009   Eosinophilic esophagitis 02/13/2008   FEVER UNSPECIFIED 10/20/2009   FEVER, HX OF 03/09/2010   GERD (gastroesophageal reflux disease)    Headache(784.0) 02/06/2010   Hepatomegaly 01/06/2008   HYPERLIPIDEMIA 07/08/2007   HYPERTENSION 07/08/2007   LEUKOPENIA, MILD 09/29/2008   Melanoma in situ of left upper arm (HCC) 04/03/2023   OBESITY 01/06/2008   OSTEOARTHRITIS 01/06/2008   Other chronic nonalcoholic liver disease 01/06/2008   PERIPHERAL NEUROPATHY 11/21/2007   Peripheral neuropathy    Postsurgical hypothyroidism 09/19/2010   SINUSITIS- ACUTE-NOS 11/21/2007   TB SKIN TEST, POSITIVE 02/06/2010   THYROID NODULE 03/09/2010   Past Surgical History:  Procedure Laterality Date   ABDOMINAL HYSTERECTOMY     BASAL CELL CARCINOMA EXCISION     CARDIAC CATHETERIZATION     CHOLECYSTECTOMY N/A 05/11/2021   Procedure: LAPAROSCOPIC CHOLECYSTECTOMY WITH POSSIBLE   INTRAOPERATIVE CHOLANGIOGRAM;  Surgeon: Luretha Murphy, MD;  Location: [facility] ORS;  Service: General;  Laterality: N/A;   COLONOSCOPY     CORONARY ATHERECTOMY N/A 05/03/2022   Procedure: CORONARY ATHERECTOMY;  Surgeon: Orbie Pyo, MD;  Location: [facility] INVASIVE CV LAB;  Service: Cardiovascular;  Laterality: N/A;   CORONARY IMAGING/OCT N/A 05/03/2022   Procedure: INTRAVASCULAR IMAGING/OCT;  Surgeon: Orbie Pyo, MD;  Location: [facility] INVAS[facility] CV LAB;  Service: Cardiovascular;  Laterality: N/A;   CORONARY PRESSURE/FFR STUDY N/A 05/03/2022   Procedure: INTRAVASCULAR PRESSURE WIRE/FFR STUDY;  Surgeon: Orbie Pyo, MD;  Location: [facility] CV LAB;  Service: Cardiovascular;  Laterality: N/A;   CORONARY STENT INTERVENTION N/A 05/03/2022   Procedure: CORONARY STENT INTERVENTION;  Surgeon: Orbie Pyo, MD;  Location: [facility] CV [facility];  Service: Cardiovascular;  Laterality: N/A;  lad   EYE SURGERY Bilateral 08/2018, 09/2018   FOOT SURGERY Left    10/2019   KNEE ARTHROSCOPY Left    LEFT HEART CATH AND CORONARY ANGIOGRAPHY N/A 01/20/2019   Procedure: LEFT HEART CATH AND CORONARY ANGIOGRAPHY;  Surgeon: Lyn Records, MD;  Location: [facility];  Service: Cardiovascular;  Laterality: N/A;   LYMPH NODE DISSECTION     OOPHORECTOMY     Right total knee replacement     RIGHT/LEFT HEART CATH AND CORONARY ANGIOGRAPHY N/A 05/03/2022   Procedure: RIGHT/LEFT HEART CATH AND CORONARY ANGIOGRAPHY;  Surgeon:  Orbie Pyo, MD;  Location: [facility];  Service: Cardiovascular;  Laterality: N/A;   ROUX-EN-Y GASTRIC BYPASS  2011   SKIN TAG REMOVAL     THYROIDECTOMY     TONSILLECTOMY AND ADENOIDECTOMY     TOOTH EXTRACTION  2023   UPPER GASTROINTESTINAL ENDOSCOPY     Patient Active Problem List   Diagnosis Date Noted   Bilateral carpal tunnel syndrome 03/13/2022   Leg edema 03/01/2022   Nondisplaced comminuted fracture of left patella, initial encounter for closed fracture 08/01/2021    Arthritis of carpometacarpal (CMC) joint of right thumb 07/28/2021   Primary osteoarthritis of right distal radioulnar joint 07/28/2021   Arthritis of wrist, right, degenerative 07/28/2021   History of malignant neoplasm of uterine body 07/27/2021   Osteopenia 01/14/2021   Sesamoiditis of left foot 11/09/2020   Posterior tibial tendinitis of left lower extremity 11/09/2020   Type 2 diabetes mellitus with diabetic peripheral angiopathy without gangrene, with long-term current use of insulin (HCC) 10/21/2020   Iron deficiency anemia 03/14/2020   Lesion of vulva 09/02/2019   Menorrhagia 09/02/2019   Neuropathy 09/02/2019   Irregular bowel habits 06/25/2019   Coronary artery disease involving native coronary artery of native heart without angina pectoris    Temporomandibular joint disorder 12/05/2018   Fatigue 12/05/2018   Inverted nipple 12/05/2018   Senile purpura (HCC) 11/21/2018   GERD with esophagitis 11/21/2018   Pseudogout involving multiple joints 11/21/2018   Lipoma 07/22/2017   Lichen sclerosus 11/16/2016   H/O gastric bypass 11/16/2016   Depression 11/16/2016   Diabetic polyneuropathy associated with type 2 diabetes mellitus (HCC) 11/16/2016   Uterine cancer (HCC) s/p hysterectomy 1997 01/04/2012   Postsurgical hypothyroidism 09/19/2010   Diabetic neuropathy (HCC) 11/21/2007   Allergic rhinitis 11/21/2007   Dyslipidemia associated with type 2 diabetes mellitus (HCC) 07/08/2007    PCP: [name], MD  REFERRING PROVIDER: Ardith Dark, MD  REFERRING DIAG: R60.0 (ICD-10-CM) - Leg edema  THERAPY DIAG:  Lymphedema, not elsewhere classified  Rationale for Evaluation and Treatment: Rehabilitation  ONSET DATE: Prior to 1997  SUBJECTIVE:                                                                                                                                                                                           SUBJECTIVE STATEMENT:  I got my stuff.  I  tried on the stocking but it may be hard to stay up without the silicone.    EVAL: I have been having swelling since before 1997 that was mostly in my ankles. Then after my surgery for uterine cancer it worsened on  the L leg. I take lasix but it has minimal effect. I have never had lymphedema therapy. I had prescription strength thigh highs but they did not help.   PERTINENT HISTORY:  Uterine cancer in 1997 with lymph node removal (pt reports the surgeon told her he got all the lymph nodes he could find bilaterally), pt reports her vascular doctor told her the left leg edema was vascular, diabetic, has hx of cardiac stent   PAIN:  Are you having pain? No  PRECAUTIONS: Other: R Knee TKA  WEIGHT BEARING RESTRICTIONS: No  FALLS:  Has patient fallen in last 6 months? Yes. Number of falls 3 or 4 Falls due to feeling unsteady, has neuropathy, BP has been an issue - low  PATIENT GOALS: to get the swelling down   OBJECTIVE:  SENSATION: pt reports neuropathy in bilateral feet  LYMPHEDEMA ASSESSMENTS:   LE LANDMARK RIGHT EVAL  At groin   30 cm proximal to suprapatella   20 cm proximal to suprapatella 47  10 cm proximal to suprapatella 40.3  At midpatella / popliteal crease 39  30 cm proximal to floor at lateral plantar foot 32.1  20 cm proximal to floor at lateral plantar foot 26.1  10 cm proximal to floor at lateral plantar foot 25.5  Circumference of ankle/heel   5 cm proximal to 1st MTP joint 22.7  Across MTP joint 24  Around proximal great toe 8  (Blank rows = not tested)  LE LANDMARK LEFT EVAL 03/18/23 03/27/23 04/05/23 04/26/23  At groin       30 cm proximal to suprapatella       20 cm proximal to suprapatella 46 47 44.5    10 cm proximal to suprapatella 41.2 43 41 41   At midpatella / popliteal crease 40.3 40.3 39.5 40   30 cm proximal to floor at lateral plantar foot 37.5 37.7 32 32.3   20 cm proximal to floor at lateral plantar foot 30.3 31.2 28.1 27.5   10 cm proximal to  floor at lateral plantar foot 28.1 27.6 28 26.5   Circumference of ankle/heel       5 cm proximal to 1st MTP joint 25 24.7 23.5 24   Across MTP joint 23.4 24 23.3 23.3   Around proximal great toe 8.7 8.7 8.9 8.6   (Blank rows = not tested)   TODAY'S TREATMENT:                                                                                                                                         DATE:  05/30/23 Manual Therapy  Pt donned stocking with her medi slip with some difficulty at the ankle and hand pain.  She was able to get it on though.  The garment fits very well.  She then was educated on the butler and tried to get it on with this which worked  well.  Discussed lack of silicone and discussed shorts or It stays! Lotion.  Also discussed that she may want to just order a new stocking of the same with silicone in a different color to have as it should be covered.  Donned night garment which fits well.   Educ on garment care Brief MLD to Lt LE as follows: Supine with bil LE's on bolster: Short neck, superficial and deep abdominals, Left axillary nodes, Lt inguino-axillary anastomosis, then Lt LE working proximal to distal and then back proximally reversing all steps.  Focused on areas of increased swelling at the foot and ankle.  Donned garment for leaving  05/23/23 Manual Therapy  MLD to Lt LE as follows: Supine with bil LE's on bolster: Short neck, superficial and deep abdominals, Left axillary nodes, Lt inguino-axillary anastomosis, then Lt LE working proximal to distal and then back proximally reversing all steps.  Focused on areas of increased swelling at the foot and ankle.  Compression Bandaging to Lt LE as follows: (knee high only) Cocoa butter to Lt LE including foot/ankle, TG soft x1 from foot to knee, Elastomull to toes 1-4, Artiflex to foot/ankle, and rosidal soft to knee, then 6 cm in Roman sandal, then 8 cm ASH, 2 - 10 cm from ankle to knee,   05/13/23 Manual Therapy  MLD to  Lt LE as follows: Supine with bil LE's on bolster: Short neck, superficial and deep abdominals, Left axillary nodes, Lt inguino-axillary anastomosis, then Lt LE working proximal to distal and then back proximally reversing all steps.  Focused on areas of increased swelling at the foot and ankle.  Compression Bandaging to Lt LE as follows: (knee high only) Cocoa butter to Lt LE including foot/ankle, TG soft x1 from foot to knee, Elastomull to toes 1-4, Artiflex to foot/ankle, and rosidal soft to knee, then 6 cm in Roman sandal, then 8 cm ASH, 2 - 10 cm from ankle to knee,   05/08/23 Manual Therapy  MLD to Lt LE as follows: Supine with bil LE's on bolster: Short neck, superficial and deep abdominals, Left axillary nodes, Lt inguino-axillary anastomosis, then Lt LE working proximal to distal and then back proximally reversing all steps.  Focused on areas of increased swelling at the foot and ankle.  Compression Bandaging to Lt LE as follows: (knee high only) Cocoa butter to Lt LE including foot/ankle, TG soft x1 from foot to knee, Elastomull to toes 1-4, Artiflex to foot/ankle, and rosidal soft to knee, then 6 cm in Roman sandal, then 8 cm ASH, 2 - 10 cm from ankle to knee,   PATIENT EDUCATION:  Education details: anatomy and physiology of the lymphatic system, need for day and night time compression garments, compression bandaging vs compression garments, venous insufficiency vs lymphedema Person educated: Patient Education method: Explanation Education comprehension: verbalized understanding  HOME EXERCISE PROGRAM: Get post op shoes and appropriate clothing to begin bandaging at next session  ASSESSMENT:  CLINICAL IMPRESSION: Pt now has her day and night garments which fit well.  She will use these x 2 weeks and and recheck to make sure everything is working and will also follow up with pump needs.    OBJECTIVE IMPAIRMENTS: decreased knowledge of condition, decreased knowledge of use of DME,  difficulty walking, increased edema, and pain.   ACTIVITY LIMITATIONS: standing and locomotion level  PARTICIPATION LIMITATIONS: community activity  PERSONAL FACTORS: Fitness and Time since onset of injury/illness/exacerbation are also affecting patient's functional outcome.   REHAB POTENTIAL: Good  CLINICAL DECISION MAKING: Evolving/moderate complexity  EVALUATION COMPLEXITY: Moderate   GOALS: Goals reviewed with patient? Yes  SHORT TERM GOALS: Target date: 03/27/23  Pt will demonstrate a 3 cm decrease in edema at 30 cm superior to floor at lateral malleoli.  Baseline: Goal status: MET   LONG TERM GOALS: Target date: 04/24/23  Pt will be independent in self MLD for long term management of lymphedema.  Baseline:  Goal status: MET  2.  Pt will receive trial of FlexiTouch compression pump for long term management of lymphedema.  Baseline:  Goal status: ONGOING  3.  Pt will obtain appropriate day and night time garments for long term management of lymphedema.  Baseline:  Goal status: MET  4.  Pt will demonstrate maximal circumferential reduction to decrease risk of cellulitis and improve mobility.  Baseline:  Goal status MET    PLAN:  PT FREQUENCY: 3x/week  PT DURATION: 4 weeks  PLANNED INTERVENTIONS: Therapeutic exercises, Therapeutic activity, Patient/Family education, Self Care, Orthotic/Fit training, Manual lymph drainage, Compression bandaging, Taping, Vasopneumatic device, Manual therapy, and Re-evaluation  PLAN FOR NEXT SESSION: measure next visit.- how are garments? Pump update? Review final lymphedema self care as needed    Idamae Lusher, PT 05/30/2023, 2:50 PM    DIAGNOSIS ASSESSMENT [x]  Lymphedema, not elsewhere classified [I89.0]  Secondary to: Uterine cancer treatment  []  Post-mastectomy lymphedema [I97.2]   []  Hereditary/Congenital lymphedema [Q82.0]  []  Praecox []  Tarda []  Other:   LOCATION OF LYMPHEDEMA []   Right Arm  []  Left Arm  []   Chest/Breast  [x]  Abdomen/Trunk [x]  Other: Lt Lower extremity lymphedema, Lt vulva and suprapubic up to umbillicus  Date of onset, or duration: 1997, CDT from 02/27/23 to current  Pt is compliant with therapy with self bandaging and MLD since 02/27/23  SEVERITY Symptoms and observations from physical exam Stage of Lymphedema:  [x] Stage II  []  Stage III  [] Other: []  Hyperkeratosis []  Hyperpigmentation []  Lymphorrhea (weeping) []  Papillomatosis (warts, nodules, papules)  []  Elephantiasis  [x]  Fibrosis and/or radiation fibrosis [x]  Pitting edema []  Recurrent episode(s) of infection/cellulitis []  Pain []  Peau d'orange []  Chest and/or axillary swelling caused by: []  History of chest/breast/abdominal surgery(ies) []  Lymph node dissection, radiation and or chemotherapy []  Truncal and/or abdominal swelling []  Axillary cording [x]  Other, explain: lymphedema in the suprapubic area - with history of lymph node removal x 2 from on the Lt vulva in 2007 and 2010 by Dr. Ellyn Hack -  could worsen without advanced pump coverage   CONSERVATIVE TREATMENT (CT) INITATION Start date (if different from this visit date):02/27/23  Conservative treatment plan includes the following activities (check all that apply): [x]  Regular exercise  [x]  Elevation of the limb(s) [x]  Compression garment(s) or compression bandage system  []  Compression garments with a minimum of  40 mmHg distally  []  Compression bra and/or tank top [x]  Compression bandage system  Type: [x]  Multi-layer short stretch []  Velcro reduction kit    []  Two- or three-layer prepackaged compression kit   []  Other:  [x]  Complete decongestive therapy (CDT) or lymphedema therapy   [x]  Minimum of 30 minutes daily MLD or self-MLD  [x]  Diet assessment with modification, if applicable. []  For patients with congestive heart failure (CHF) a medication assessment with modification(s), if applicable. []  Other:   Has the patient tried an [id number] basic pneumatic  compression device? []  Yes  [x]  No Comments:   MEASUREMENTS Abdominal measurement at umbilicus: 105.5 cm See above for other leg measurements

## 2023-06-03 ENCOUNTER — Ambulatory Visit: Payer: Medicare Other | Admitting: Physician Assistant

## 2023-06-03 DIAGNOSIS — G894 Chronic pain syndrome: Secondary | ICD-10-CM | POA: Diagnosis not present

## 2023-06-03 DIAGNOSIS — E1142 Type 2 diabetes mellitus with diabetic polyneuropathy: Secondary | ICD-10-CM | POA: Diagnosis not present

## 2023-06-03 DIAGNOSIS — M791 Myalgia, unspecified site: Secondary | ICD-10-CM | POA: Diagnosis not present

## 2023-06-03 DIAGNOSIS — M47816 Spondylosis without myelopathy or radiculopathy, lumbar region: Secondary | ICD-10-CM | POA: Diagnosis not present

## 2023-06-03 DIAGNOSIS — M47812 Spondylosis without myelopathy or radiculopathy, cervical region: Secondary | ICD-10-CM | POA: Diagnosis not present

## 2023-06-05 ENCOUNTER — Ambulatory Visit: Payer: Medicare Other | Attending: Physician Assistant | Admitting: Physician Assistant

## 2023-06-05 ENCOUNTER — Encounter: Payer: Self-pay | Admitting: Physician Assistant

## 2023-06-05 VITALS — BP 99/67 | HR 74 | Resp 15 | Ht 71.0 in | Wt 180.8 lb

## 2023-06-05 DIAGNOSIS — M19042 Primary osteoarthritis, left hand: Secondary | ICD-10-CM | POA: Diagnosis not present

## 2023-06-05 DIAGNOSIS — M1990 Unspecified osteoarthritis, unspecified site: Secondary | ICD-10-CM | POA: Diagnosis not present

## 2023-06-05 DIAGNOSIS — M19072 Primary osteoarthritis, left ankle and foot: Secondary | ICD-10-CM | POA: Diagnosis not present

## 2023-06-05 DIAGNOSIS — Z8639 Personal history of other endocrine, nutritional and metabolic disease: Secondary | ICD-10-CM | POA: Diagnosis not present

## 2023-06-05 DIAGNOSIS — Z8719 Personal history of other diseases of the digestive system: Secondary | ICD-10-CM | POA: Insufficient documentation

## 2023-06-05 DIAGNOSIS — M19041 Primary osteoarthritis, right hand: Secondary | ICD-10-CM | POA: Diagnosis not present

## 2023-06-05 DIAGNOSIS — M7062 Trochanteric bursitis, left hip: Secondary | ICD-10-CM | POA: Diagnosis not present

## 2023-06-05 DIAGNOSIS — D3132 Benign neoplasm of left choroid: Secondary | ICD-10-CM | POA: Diagnosis not present

## 2023-06-05 DIAGNOSIS — M5136 Other intervertebral disc degeneration, lumbar region: Secondary | ICD-10-CM | POA: Diagnosis not present

## 2023-06-05 DIAGNOSIS — Z9884 Bariatric surgery status: Secondary | ICD-10-CM | POA: Insufficient documentation

## 2023-06-05 DIAGNOSIS — Z96651 Presence of right artificial knee joint: Secondary | ICD-10-CM | POA: Insufficient documentation

## 2023-06-05 DIAGNOSIS — M1712 Unilateral primary osteoarthritis, left knee: Secondary | ICD-10-CM | POA: Insufficient documentation

## 2023-06-05 DIAGNOSIS — K200 Eosinophilic esophagitis: Secondary | ICD-10-CM | POA: Diagnosis not present

## 2023-06-05 DIAGNOSIS — M810 Age-related osteoporosis without current pathological fracture: Secondary | ICD-10-CM | POA: Insufficient documentation

## 2023-06-05 DIAGNOSIS — Z8669 Personal history of other diseases of the nervous system and sense organs: Secondary | ICD-10-CM | POA: Diagnosis not present

## 2023-06-05 DIAGNOSIS — K7581 Nonalcoholic steatohepatitis (NASH): Secondary | ICD-10-CM | POA: Insufficient documentation

## 2023-06-05 DIAGNOSIS — Z8542 Personal history of malignant neoplasm of other parts of uterus: Secondary | ICD-10-CM | POA: Diagnosis not present

## 2023-06-05 DIAGNOSIS — E890 Postprocedural hypothyroidism: Secondary | ICD-10-CM | POA: Insufficient documentation

## 2023-06-05 DIAGNOSIS — E559 Vitamin D deficiency, unspecified: Secondary | ICD-10-CM | POA: Insufficient documentation

## 2023-06-05 DIAGNOSIS — M19071 Primary osteoarthritis, right ankle and foot: Secondary | ICD-10-CM | POA: Insufficient documentation

## 2023-06-05 DIAGNOSIS — Z5181 Encounter for therapeutic drug level monitoring: Secondary | ICD-10-CM | POA: Insufficient documentation

## 2023-06-05 DIAGNOSIS — M7061 Trochanteric bursitis, right hip: Secondary | ICD-10-CM | POA: Insufficient documentation

## 2023-06-05 DIAGNOSIS — H04123 Dry eye syndrome of bilateral lacrimal glands: Secondary | ICD-10-CM | POA: Diagnosis not present

## 2023-06-05 DIAGNOSIS — M5030 Other cervical disc degeneration, unspecified cervical region: Secondary | ICD-10-CM | POA: Diagnosis not present

## 2023-06-05 DIAGNOSIS — Z9049 Acquired absence of other specified parts of digestive tract: Secondary | ICD-10-CM | POA: Diagnosis not present

## 2023-06-05 DIAGNOSIS — R7611 Nonspecific reaction to tuberculin skin test without active tuberculosis: Secondary | ICD-10-CM | POA: Diagnosis not present

## 2023-06-05 DIAGNOSIS — Z961 Presence of intraocular lens: Secondary | ICD-10-CM | POA: Diagnosis not present

## 2023-06-05 DIAGNOSIS — H353131 Nonexudative age-related macular degeneration, bilateral, early dry stage: Secondary | ICD-10-CM | POA: Diagnosis not present

## 2023-06-05 NOTE — Patient Instructions (Signed)

## 2023-06-10 ENCOUNTER — Encounter: Payer: Self-pay | Admitting: Rehabilitation

## 2023-06-13 DIAGNOSIS — E1122 Type 2 diabetes mellitus with diabetic chronic kidney disease: Secondary | ICD-10-CM | POA: Diagnosis not present

## 2023-06-13 DIAGNOSIS — D2230 Melanocytic nevi of unspecified part of face: Secondary | ICD-10-CM | POA: Diagnosis not present

## 2023-06-13 DIAGNOSIS — Z9884 Bariatric surgery status: Secondary | ICD-10-CM | POA: Diagnosis not present

## 2023-06-13 DIAGNOSIS — K219 Gastro-esophageal reflux disease without esophagitis: Secondary | ICD-10-CM | POA: Diagnosis not present

## 2023-06-13 DIAGNOSIS — E039 Hypothyroidism, unspecified: Secondary | ICD-10-CM | POA: Diagnosis not present

## 2023-06-13 DIAGNOSIS — Z86006 Personal history of melanoma in-situ: Secondary | ICD-10-CM | POA: Diagnosis not present

## 2023-06-13 DIAGNOSIS — E0842 Diabetes mellitus due to underlying condition with diabetic polyneuropathy: Secondary | ICD-10-CM | POA: Diagnosis not present

## 2023-06-13 DIAGNOSIS — L900 Lichen sclerosus et atrophicus: Secondary | ICD-10-CM | POA: Diagnosis not present

## 2023-06-13 DIAGNOSIS — D225 Melanocytic nevi of trunk: Secondary | ICD-10-CM | POA: Diagnosis not present

## 2023-06-13 DIAGNOSIS — B3731 Acute candidiasis of vulva and vagina: Secondary | ICD-10-CM | POA: Diagnosis not present

## 2023-06-13 DIAGNOSIS — L570 Actinic keratosis: Secondary | ICD-10-CM | POA: Diagnosis not present

## 2023-06-13 DIAGNOSIS — N189 Chronic kidney disease, unspecified: Secondary | ICD-10-CM | POA: Diagnosis not present

## 2023-06-13 DIAGNOSIS — N1832 Chronic kidney disease, stage 3b: Secondary | ICD-10-CM | POA: Diagnosis not present

## 2023-06-18 ENCOUNTER — Other Ambulatory Visit: Payer: Self-pay | Admitting: Internal Medicine

## 2023-06-18 ENCOUNTER — Telehealth: Payer: Self-pay | Admitting: Gastroenterology

## 2023-06-18 DIAGNOSIS — R194 Change in bowel habit: Secondary | ICD-10-CM

## 2023-06-18 DIAGNOSIS — E0842 Diabetes mellitus due to underlying condition with diabetic polyneuropathy: Secondary | ICD-10-CM

## 2023-06-18 DIAGNOSIS — E039 Hypothyroidism, unspecified: Secondary | ICD-10-CM

## 2023-06-18 DIAGNOSIS — E1129 Type 2 diabetes mellitus with other diabetic kidney complication: Secondary | ICD-10-CM

## 2023-06-18 DIAGNOSIS — Z9884 Bariatric surgery status: Secondary | ICD-10-CM

## 2023-06-18 DIAGNOSIS — Z8719 Personal history of other diseases of the digestive system: Secondary | ICD-10-CM

## 2023-06-18 DIAGNOSIS — N1832 Chronic kidney disease, stage 3b: Secondary | ICD-10-CM

## 2023-06-18 DIAGNOSIS — R197 Diarrhea, unspecified: Secondary | ICD-10-CM

## 2023-06-18 NOTE — Telephone Encounter (Signed)
Called and spoke with patient regarding recommendations below. Pt knows that she can stop by the lab in the basement of our office at her convenience to pick up stool kits, then head to x-ray department - no appt needed for either. Pt verbalized understanding and had no concerns at the end of the call.

## 2023-06-18 NOTE — Telephone Encounter (Signed)
Inbound call from patient stating that she is having soft\ diarrhea stools 6- 8 times day and up all night.Requesting a call back to discuss. Please advise.

## 2023-06-18 NOTE — Telephone Encounter (Signed)
Dr. Rhea Belton as DOD AM of 06/18/23 - please advise, thanks.   Dr. Lanetta Inch patient with a history of constipation. Called and spoke with patient. Pt reports that she has always had altered bowel habits due to IBS. Pt has noticed an increase in diarrhea in the last 3 months. Pt reported that she used to have bouts of diarrhea a few times a week, now she is having several loose/watery stools in a day. Last couple of weeks symptoms have been more constant. Pt reports that she can pass anywhere from watery diarrhea to "little pieces of soft stool". Pt reports intermittent abdominal pain, alternates between (L) and (R) side of abdomen underneath her ribs. Pt is s/p cholecystectomy, she reports that her diet is terrible and she's a diabetic. Currently on metformin, Trulicity, and Prandin. Pt denies any new medications in the last 3 months, no recent antibiotic use, has not tried any OTC remedies (scared to do this without OK from provider), no fiber. Denies nausea and vomiting. I mentioned to patient that she may have overflow diarrhea, hard to tell without imaging. Please advise, thanks.

## 2023-06-18 NOTE — Telephone Encounter (Signed)
Start with stools studies and 2 view abd xray C diff  Stool Culture + ova and parasite exam Further recs thereafter  JMP

## 2023-06-19 ENCOUNTER — Encounter: Payer: Self-pay | Admitting: Rehabilitation

## 2023-06-19 ENCOUNTER — Ambulatory Visit: Payer: Medicare Other | Attending: Family Medicine | Admitting: Rehabilitation

## 2023-06-19 DIAGNOSIS — I890 Lymphedema, not elsewhere classified: Secondary | ICD-10-CM | POA: Insufficient documentation

## 2023-06-19 NOTE — Therapy (Addendum)
 OUTPATIENT PHYSICAL THERAPY LOWER EXTREMITY LYMPHEDEMA TREATMENT  Patient Name: Savannah [name] MRN: [id number] DOB:[birth year], 67 y.o., female Today's Date: 06/19/2023  END OF SESSION:  PT End of Session - 06/19/23 1356     Visit Number 19    Number of Visits 25    Date for PT Re-Evaluation 07/31/23    PT Start Time 1400    PT Stop Time 1451    PT Time Calculation (min) 51 min    Activity Tolerance Patient tolerated treatment well    Behavior During Therapy WFL for tasks assessed/performed                   Past Medical History:  Diagnosis Date   ABDOMINAL PAIN, CHRONIC 10/20/2009   Acute cystitis 08/17/2009   ALLERGIC RHINITIS 11/21/2007   Allergy     ASYMPTOMATIC POSTMENOPAUSAL STATUS 09/29/2008   Bariatric surgery status 07/07/2010   Bariatric surgery status 07/07/2010   Qualifier: Diagnosis of  By: Kassie MD, Sean A    Cancer [facility])    uterine   Cataract    Coronary artery disease    DEPRESSION 11/21/2007   DIABETES MELLITUS, TYPE II 07/08/2007   DYSPNEA 05/20/2009   Edema 05/20/2009   Eosinophilic esophagitis 02/13/2008   FEVER UNSPECIFIED 10/20/2009   FEVER, HX OF 03/09/2010   GERD (gastroesophageal reflux disease)    Headache(784.0) 02/06/2010   Hepatomegaly 01/06/2008   HYPERLIPIDEMIA 07/08/2007   HYPERTENSION 07/08/2007   LEUKOPENIA, MILD 09/29/2008   Lymphedema    left leg, dx approximately in 03/2023 per patient   Melanoma in situ of left upper arm (HCC) 04/03/2023   OBESITY 01/06/2008   OSTEOARTHRITIS 01/06/2008   Other chronic nonalcoholic liver disease 01/06/2008   PERIPHERAL NEUROPATHY 11/21/2007   Peripheral neuropathy    Postsurgical hypothyroidism 09/19/2010   SINUSITIS- ACUTE-NOS 11/21/2007   TB SKIN TEST, POSITIVE 02/06/2010   THYROID  NODULE 03/09/2010   Past Surgical History:  Procedure Laterality Date   ABDOMINAL HYSTERECTOMY     BASAL CELL CARCINOMA EXCISION     CARDIAC CATHETERIZATION     CHOLECYSTECTOMY N/A 05/11/2021    Procedure: LAPAROSCOPIC CHOLECYSTECTOMY WITH POSSIBLE  INTRAOPERATIVE CHOLANGIOGRAM;  Surgeon: Gladis Cough, MD;  Location: [facility];  Service: General;  Laterality: N/A;   COLONOSCOPY     CORONARY ATHERECTOMY N/A 05/03/2022   Procedure: CORONARY ATHERECTOMY;  Surgeon: Wendel Lurena POUR, MD;  Location: [facility];  Service: Cardiovascular;  Laterality: N/A;   CORONARY IMAGING/OCT N/A 05/03/2022   Procedure: INTRAVASCULAR IMAGING/OCT;  Surgeon: Wendel Lurena POUR, MD;  Location: [facility];  Service: Cardiovascular;  Laterality: N/A;   CORONARY PRESSURE/FFR STUDY N/A 05/03/2022   Procedure: INTRAVASCULAR PRESSURE WIRE/FFR STUDY;  Surgeon: Wendel Lurena POUR, MD;  Location: [facility];  Service: Cardiovascular;  Laterality: N/A;   CORONARY STENT INTERVENTION N/A 05/03/2022   Procedure: CORONARY STENT INTERVENTION;  Surgeon: Wendel Lurena POUR, MD;  Location: [facility];  Service: Cardiovascular;  Laterality: N/A;  lad   EYE SURGERY Bilateral 08/2018, 09/2018   FOOT SURGERY Left    10/2019   KNEE ARTHROSCOPY Left    LEFT HEART CATH AND CORONARY ANGIOGRAPHY N/A 01/20/2019   Procedure: LEFT HEART CATH AND CORONARY ANGIOGRAPHY;  Surgeon: Claudene Victory ORN, MD;  Location: [facility];  Service: Cardiovascular;  Laterality: N/A;   LYMPH NODE DISSECTION     OOPHORECTOMY     Right total knee replacement     RIGHT/LEFT HEART CATH AND CORONARY  ANGIOGRAPHY N/A 05/03/2022   Procedure: RIGHT/LEFT HEART CATH AND CORONARY ANGIOGRAPHY;  Surgeon: Wendel Lurena POUR, MD;  Location: [facility];  Service: Cardiovascular;  Laterality: N/A;   ROUX-EN-Y GASTRIC BYPASS  2011   SKIN TAG REMOVAL     THYROIDECTOMY     TONSILLECTOMY AND ADENOIDECTOMY     TOOTH EXTRACTION  2023   UPPER GASTROINTESTINAL ENDOSCOPY     UPPER GI ENDOSCOPY  2024   Patient Active Problem List   Diagnosis Date Noted   Bilateral carpal tunnel syndrome 03/13/2022   Leg edema 03/01/2022   Nondisplaced  comminuted fracture of left patella, initial encounter for closed fracture 08/01/2021   Arthritis of carpometacarpal (CMC) joint of right thumb 07/28/2021   Primary osteoarthritis of right distal radioulnar joint 07/28/2021   Arthritis of wrist, right, degenerative 07/28/2021   History of malignant neoplasm of uterine body 07/27/2021   Osteopenia 01/14/2021   Sesamoiditis of left foot 11/09/2020   Posterior tibial tendinitis of left lower extremity 11/09/2020   Type 2 diabetes mellitus with diabetic peripheral angiopathy without gangrene, with long-term current use of insulin  (HCC) 10/21/2020   Iron  deficiency anemia 03/14/2020   Lesion of vulva 09/02/2019   Menorrhagia 09/02/2019   Neuropathy 09/02/2019   Irregular bowel habits 06/25/2019   Coronary artery disease involving native coronary artery of native heart without angina pectoris    Temporomandibular joint disorder 12/05/2018   Fatigue 12/05/2018   Inverted nipple 12/05/2018   Senile purpura (HCC) 11/21/2018   GERD with esophagitis 11/21/2018   Pseudogout involving multiple joints 11/21/2018   Lipoma 07/22/2017   Lichen sclerosus 11/16/2016   H/O gastric bypass 11/16/2016   Depression 11/16/2016   Diabetic polyneuropathy associated with type 2 diabetes mellitus (HCC) 11/16/2016   Uterine cancer (HCC) s/p hysterectomy 1997 01/04/2012   Postsurgical hypothyroidism 09/19/2010   Diabetic neuropathy (HCC) 11/21/2007   Allergic rhinitis 11/21/2007   Dyslipidemia associated with type 2 diabetes mellitus (HCC) 07/08/2007    PCP: Worth Kitty, MD  REFERRING PROVIDER: Kitty Worth HERO, MD  REFERRING DIAG: R60.0 (ICD-10-CM) - Leg edema  THERAPY DIAG:  Lymphedema, not elsewhere classified  Rationale for Evaluation and Treatment: Rehabilitation  ONSET DATE: Prior to 1997  SUBJECTIVE:                                                                                                                                                                                            SUBJECTIVE STATEMENT:  I have some problems with both of them.  The day garment does not stay up and it does not feel good behind the knee.  The night  garment is too long and it is painful at the foot when I sleep.  I also have not wrapped or had anything on x 1 week at least due to a death at the church etc.    EVAL: I have been having swelling since before 1997 that was mostly in my ankles. Then after my surgery for uterine cancer it worsened on the L leg. I take lasix  but it has minimal effect. I have never had lymphedema therapy. I had prescription strength thigh highs but they did not help.   PERTINENT HISTORY:  Uterine cancer in 1997 with lymph node removal (pt reports the surgeon told her he got all the lymph nodes he could find bilaterally), pt reports her vascular doctor told her the left leg edema was vascular, diabetic, has hx of cardiac stent   PAIN:  Are you having pain? No  PRECAUTIONS: Other: R Knee TKA  WEIGHT BEARING RESTRICTIONS: No  FALLS:  Has patient fallen in last 6 months? Yes. Number of falls 3 or 4 Falls due to feeling unsteady, has neuropathy, BP has been an issue - low  PATIENT GOALS: to get the swelling down   OBJECTIVE:  SENSATION: pt reports neuropathy in bilateral feet  LYMPHEDEMA ASSESSMENTS:   LE LANDMARK RIGHT EVAL  At groin   30 cm proximal to suprapatella   20 cm proximal to suprapatella 47  10 cm proximal to suprapatella 40.3  At midpatella / popliteal crease 39  30 cm proximal to floor at lateral plantar foot 32.1  20 cm proximal to floor at lateral plantar foot 26.1  10 cm proximal to floor at lateral plantar foot 25.5  Circumference of ankle/heel   5 cm proximal to 1st MTP joint 22.7  Across MTP joint 24  Around proximal great toe 8  (Blank rows = not tested)  LE LANDMARK LEFT EVAL 03/18/23 03/27/23 04/05/23 06/19/23  At groin       30 cm proximal to suprapatella       20 cm proximal to  suprapatella 46 47 44.5  44.5  10 cm proximal to suprapatella 41.2 43 41 41 42  At midpatella / popliteal crease 40.3 40.3 39.5 40 40  30 cm proximal to floor at lateral plantar foot 37.5 37.7 32 32.3 38.8  20 cm proximal to floor at lateral plantar foot 30.3 31.2 28.1 27.5 31.8  10 cm proximal to floor at lateral plantar foot 28.1 27.6 28 26.5 29  Circumference of ankle/heel     38.4  5 cm proximal to 1st MTP joint 25 24.7 23.5 24 26   Across MTP joint 23.4 24 23.3 23.3 25  Around proximal great toe 8.7 8.7 8.9 8.6 8.6  (Blank rows = not tested)   TODAY'S TREATMENT:  DATE:  06/19/23 Pt brought in day and night garments to discuss changes that need to be made - this was all passed along to [place] at [place] via email and pt was copied.   The day garment fits well but having no silicone it slides down quickly.  The flexure zone behind the knee is also uncomfortable.  The night garment is too long and it gets tight at the foot when sleeping so she wakes up with pain in the toes.   We measured pt for off the shelf circular knit so that she has something to wear to church per request but her thigh is too small compared to the lower leg to fit into the size chart, even when using old measurements.   Discussed options and pt will return to wrapping and use of night garment and await instruction on garment change options.  She may also want to get a custom pair or circular knit.    05/30/23 Manual Therapy  Pt donned stocking with her medi slip with some difficulty at the ankle and hand pain.  She was able to get it on though.  The garment fits very well.  She then was educated on the butler and tried to get it on with this which worked well.  Discussed lack of silicone and discussed shorts or It stays! Lotion.  Also discussed that she may want to just order a new stocking  of the same with silicone in a different color to have as it should be covered.  Donned night garment which fits well.   Educ on garment care Brief MLD to Lt LE as follows: Supine with bil LE's on bolster: Short neck, superficial and deep abdominals, Left axillary nodes, Lt inguino-axillary anastomosis, then Lt LE working proximal to distal and then back proximally reversing all steps.  Focused on areas of increased swelling at the foot and ankle.  Donned garment for leaving  05/23/23 Manual Therapy  MLD to Lt LE as follows: Supine with bil LE's on bolster: Short neck, superficial and deep abdominals, Left axillary nodes, Lt inguino-axillary anastomosis, then Lt LE working proximal to distal and then back proximally reversing all steps.  Focused on areas of increased swelling at the foot and ankle.  Compression Bandaging to Lt LE as follows: (knee high only) Cocoa butter to Lt LE including foot/ankle, TG soft x1 from foot to knee, Elastomull to toes 1-4, Artiflex to foot/ankle, and rosidal soft to knee, then 6 cm in Roman sandal, then 8 cm ASH, 2 - 10 cm from ankle to knee,   05/13/23 Manual Therapy  MLD to Lt LE as follows: Supine with bil LE's on bolster: Short neck, superficial and deep abdominals, Left axillary nodes, Lt inguino-axillary anastomosis, then Lt LE working proximal to distal and then back proximally reversing all steps.  Focused on areas of increased swelling at the foot and ankle.  Compression Bandaging to Lt LE as follows: (knee high only) Cocoa butter to Lt LE including foot/ankle, TG soft x1 from foot to knee, Elastomull to toes 1-4, Artiflex to foot/ankle, and rosidal soft to knee, then 6 cm in Roman sandal, then 8 cm ASH, 2 - 10 cm from ankle to knee,   05/08/23 Manual Therapy  MLD to Lt LE as follows: Supine with bil LE's on bolster: Short neck, superficial and deep abdominals, Left axillary nodes, Lt inguino-axillary anastomosis, then Lt LE working proximal to distal and then  back proximally reversing all steps.  Focused on  areas of increased swelling at the foot and ankle.  Compression Bandaging to Lt LE as follows: (knee high only) Cocoa butter to Lt LE including foot/ankle, TG soft x1 from foot to knee, Elastomull to toes 1-4, Artiflex to foot/ankle, and rosidal soft to knee, then 6 cm in Roman sandal, then 8 cm ASH, 2 - 10 cm from ankle to knee,   PATIENT EDUCATION:  Education details: anatomy and physiology of the lymphatic system, need for day and night time compression garments, compression bandaging vs compression garments, venous insufficiency vs lymphedema Person educated: Patient Education method: Explanation Education comprehension: verbalized understanding  HOME EXERCISE PROGRAM: Get post op shoes and appropriate clothing to begin bandaging at next session  ASSESSMENT:  CLINICAL IMPRESSION: Pt is not having success with current garments and has increased in size back to at least baseline.  She would benefit from garment changes to allow for better fit and daily use.  She would like to self bandage and wear things as able but knows she can return if needed for CDT.    OBJECTIVE IMPAIRMENTS: decreased knowledge of condition, decreased knowledge of use of DME, difficulty walking, increased edema, and pain.   ACTIVITY LIMITATIONS: standing and locomotion level  PARTICIPATION LIMITATIONS: community activity  PERSONAL FACTORS: Fitness and Time since onset of injury/illness/exacerbation are also affecting patient's functional outcome.   REHAB POTENTIAL: Good  CLINICAL DECISION MAKING: Evolving/moderate complexity  EVALUATION COMPLEXITY: Moderate   GOALS: Goals reviewed with patient? Yes  SHORT TERM GOALS: Target date: 03/27/23  Pt will demonstrate a 3 cm decrease in edema at 30 cm superior to floor at lateral malleoli.  Baseline: Goal status: MET   LONG TERM GOALS: Target date: 04/24/23  Pt will be independent in self MLD for long term  management of lymphedema.  Baseline:  Goal status: MET  2.  Pt will receive trial of FlexiTouch compression pump for long term management of lymphedema.  Baseline:  Goal status: MET  3.  Pt will obtain appropriate day and night time garments for long term management of lymphedema.  Baseline:  Goal status: MET  4.  Pt will demonstrate maximal circumferential reduction to decrease risk of cellulitis and improve mobility.  Baseline:  Goal status MET    PLAN:  PT FREQUENCY: 3x/week  PT DURATION: 4 weeks  PLANNED INTERVENTIONS: Therapeutic exercises, Therapeutic activity, Patient/Family education, Self Care, Orthotic/Fit training, Manual lymph drainage, Compression bandaging, Taping, Vasopneumatic device, Manual therapy, and Re-evaluation  PLAN FOR NEXT SESSION: as needed    Larue Saddie SAUNDERS, PT 06/19/2023, 2:55 PM    DIAGNOSIS ASSESSMENT [x]  Lymphedema, not elsewhere classified [I89.0]  Secondary to: Uterine cancer treatment  []  Post-mastectomy lymphedema [I97.2]   []  Hereditary/Congenital lymphedema [Q82.0]  []  Praecox []  Tarda []  Other:   LOCATION OF LYMPHEDEMA []   Right Arm  []  Left Arm  []  Chest/Breast  [x]  Abdomen/Trunk [x]  Other: Lt Lower extremity lymphedema, Lt vulva and suprapubic up to umbillicus  Date of onset, or duration: 1997, CDT from 02/27/23 to current  Pt is compliant with therapy with self bandaging and MLD since 02/27/23, flat knit garments and compression shorts  SEVERITY Symptoms and observations from physical exam Stage of Lymphedema:  [x] Stage II  []  Stage III  [] Other: []  Hyperkeratosis []  Hyperpigmentation []  Lymphorrhea (weeping) []  Papillomatosis (warts, nodules, papules)  []  Elephantiasis  [x]  Fibrosis and/or radiation fibrosis [x]  Pitting edema []  Recurrent episode(s) of infection/cellulitis []  Pain []  Peau d'orange []  Chest and/or axillary swelling caused by: []  History  of chest/breast/abdominal surgery(ies) []  Lymph node dissection,  radiation and or chemotherapy []  Truncal and/or abdominal swelling []  Axillary cording [x]  Other, explain: lymphedema in the suprapubic area - with history of lymph node removal x 2 from on the Lt vulva in 2007 and 2010 by Dr. Rolan -  could worsen without advanced pump coverage   CONSERVATIVE TREATMENT (CT) INITATION Start date (if different from this visit date):02/27/23  Conservative treatment plan includes the following activities (check all that apply): [x]  Regular exercise  [x]  Elevation of the limb(s) [x]  Compression garment(s) or compression bandage system  []  Compression garments with a minimum of  40 mmHg distally  []  Compression bra and/or tank top [x]  Compression bandage system  Type: [x]  Multi-layer short stretch []  Velcro reduction kit    []  Two- or three-layer prepackaged compression kit   []  Other:  [x]  Complete decongestive therapy (CDT) or lymphedema therapy   [x]  Minimum of 30 minutes daily MLD or self-MLD  [x]  Diet assessment with modification, if applicable. []  For patients with congestive heart failure (CHF) a medication assessment with modification(s), if applicable. []  Other:   Has the patient tried an E0651 basic pneumatic compression device? [x]  Yes  []  No Comments: I89.0 Lymphedema secondary to cancer, I97.2 Post Mastectomy Lymphedema Lymphedema extending into the abdomen Adequate truncal compression completed for 4 weeks (Cami, biker shorts, etc.) Solid recommendation for Flexitouch - due to genital and abdominal swelling.   MEASUREMENTS Abdominal measurement at umbilicus: 105.5 cm consistently  See above for other leg measurements  PHYSICAL THERAPY DISCHARGE SUMMARY  Visits from Start of Care: 19  Current functional level related to goals / functional outcomes: See above   Remaining deficits: LE lymphedema    Education / Equipment: Pump, garments, and self MLD education, self bandaging   Plan: Patient agrees to discharge.  Patient is being  discharged due to meeting the stated rehab goals.

## 2023-06-21 ENCOUNTER — Other Ambulatory Visit: Payer: Medicare Other

## 2023-06-21 ENCOUNTER — Ambulatory Visit
Admission: RE | Admit: 2023-06-21 | Discharge: 2023-06-21 | Disposition: A | Discharge status: Home / Self Care | Payer: Medicare Other | Source: Ambulatory Visit | Attending: Internal Medicine | Admitting: Internal Medicine

## 2023-06-21 ENCOUNTER — Ambulatory Visit: Admission: RE | Admit: 2023-06-21 | Payer: Medicare Other | Source: Ambulatory Visit

## 2023-06-21 DIAGNOSIS — R197 Diarrhea, unspecified: Secondary | ICD-10-CM | POA: Diagnosis not present

## 2023-06-21 DIAGNOSIS — R194 Change in bowel habit: Secondary | ICD-10-CM | POA: Diagnosis not present

## 2023-06-21 DIAGNOSIS — E0842 Diabetes mellitus due to underlying condition with diabetic polyneuropathy: Secondary | ICD-10-CM

## 2023-06-21 DIAGNOSIS — Z9884 Bariatric surgery status: Secondary | ICD-10-CM

## 2023-06-21 DIAGNOSIS — K5900 Constipation, unspecified: Secondary | ICD-10-CM | POA: Diagnosis not present

## 2023-06-21 DIAGNOSIS — E1129 Type 2 diabetes mellitus with other diabetic kidney complication: Secondary | ICD-10-CM

## 2023-06-21 DIAGNOSIS — E039 Hypothyroidism, unspecified: Secondary | ICD-10-CM

## 2023-06-21 DIAGNOSIS — N281 Cyst of kidney, acquired: Secondary | ICD-10-CM | POA: Diagnosis not present

## 2023-06-21 DIAGNOSIS — N1832 Chronic kidney disease, stage 3b: Secondary | ICD-10-CM

## 2023-06-21 DIAGNOSIS — Z8719 Personal history of other diseases of the digestive system: Secondary | ICD-10-CM

## 2023-06-21 DIAGNOSIS — N189 Chronic kidney disease, unspecified: Secondary | ICD-10-CM | POA: Diagnosis not present

## 2023-06-24 ENCOUNTER — Encounter: Payer: Self-pay | Admitting: Internal Medicine

## 2023-06-25 ENCOUNTER — Encounter: Payer: Self-pay | Admitting: Otolaryngology

## 2023-06-25 DIAGNOSIS — R1314 Dysphagia, pharyngoesophageal phase: Secondary | ICD-10-CM | POA: Diagnosis not present

## 2023-06-25 DIAGNOSIS — K219 Gastro-esophageal reflux disease without esophagitis: Secondary | ICD-10-CM | POA: Diagnosis not present

## 2023-06-25 DIAGNOSIS — J309 Allergic rhinitis, unspecified: Secondary | ICD-10-CM | POA: Diagnosis not present

## 2023-06-29 ENCOUNTER — Encounter: Payer: Self-pay | Admitting: Gastroenterology

## 2023-06-29 DIAGNOSIS — R194 Change in bowel habit: Secondary | ICD-10-CM

## 2023-07-01 NOTE — Telephone Encounter (Signed)
Called patient to notify of  possible trials of medication for bulking and fiber instead. Patient states it not bulking that is needed. Patient states the x-rays are showing stool noted in the transverse colon and she wants to know if it is possible to take Prep to remove the stool noted on the x-ray. Patient is having diarrhea stools daily, greater than 2-3 stools daily. Please advise.

## 2023-07-12 ENCOUNTER — Other Ambulatory Visit: Payer: Self-pay | Admitting: Family Medicine

## 2023-08-08 NOTE — Telephone Encounter (Signed)
Please see the MyChart message reply(ies) for my assessment and plan.    This patient gave consent for this Medical Advice Message and is aware that it may result in a bill to [employer], as well as the possibility of receiving a bill for a co-payment or deductible. They are an established patient, but are not seeking medical advice exclusively about a problem treated during an in person or video visit in the last seven days. I did not recommend an in person or video visit within seven days of my reply.    I spent a total of 7 minutes cumulative time within 7 days through [employer].  Benancio Deeds, MD

## 2023-08-10 DIAGNOSIS — J0100 Acute maxillary sinusitis, unspecified: Secondary | ICD-10-CM | POA: Diagnosis not present

## 2023-08-10 DIAGNOSIS — Z20822 Contact with and (suspected) exposure to covid-19: Secondary | ICD-10-CM | POA: Diagnosis not present

## 2023-08-12 DIAGNOSIS — M1120 Other chondrocalcinosis, unspecified site: Secondary | ICD-10-CM | POA: Diagnosis not present

## 2023-08-12 DIAGNOSIS — M62838 Other muscle spasm: Secondary | ICD-10-CM | POA: Diagnosis not present

## 2023-08-13 ENCOUNTER — Encounter: Payer: Self-pay | Admitting: Family Medicine

## 2023-08-14 ENCOUNTER — Other Ambulatory Visit: Payer: Self-pay | Admitting: *Deleted

## 2023-08-14 ENCOUNTER — Other Ambulatory Visit: Payer: Self-pay | Admitting: Family Medicine

## 2023-08-14 ENCOUNTER — Institutional Professional Consult (permissible substitution) (INDEPENDENT_AMBULATORY_CARE_PROVIDER_SITE_OTHER): Payer: Medicare Other | Admitting: Otolaryngology

## 2023-08-14 DIAGNOSIS — Z7984 Long term (current) use of oral hypoglycemic drugs: Secondary | ICD-10-CM | POA: Diagnosis not present

## 2023-08-14 DIAGNOSIS — E1165 Type 2 diabetes mellitus with hyperglycemia: Secondary | ICD-10-CM | POA: Diagnosis not present

## 2023-08-14 DIAGNOSIS — M1129 Other chondrocalcinosis, multiple sites: Secondary | ICD-10-CM | POA: Diagnosis not present

## 2023-08-14 DIAGNOSIS — E875 Hyperkalemia: Secondary | ICD-10-CM | POA: Diagnosis not present

## 2023-08-14 DIAGNOSIS — Z23 Encounter for immunization: Secondary | ICD-10-CM | POA: Diagnosis not present

## 2023-08-14 DIAGNOSIS — R059 Cough, unspecified: Secondary | ICD-10-CM | POA: Diagnosis not present

## 2023-08-14 DIAGNOSIS — M11242 Other chondrocalcinosis, left hand: Secondary | ICD-10-CM | POA: Diagnosis not present

## 2023-08-14 DIAGNOSIS — E1142 Type 2 diabetes mellitus with diabetic polyneuropathy: Secondary | ICD-10-CM | POA: Diagnosis not present

## 2023-08-14 DIAGNOSIS — E860 Dehydration: Secondary | ICD-10-CM | POA: Diagnosis not present

## 2023-08-14 DIAGNOSIS — N179 Acute kidney failure, unspecified: Secondary | ICD-10-CM | POA: Diagnosis not present

## 2023-08-14 DIAGNOSIS — E1140 Type 2 diabetes mellitus with diabetic neuropathy, unspecified: Secondary | ICD-10-CM

## 2023-08-14 DIAGNOSIS — N182 Chronic kidney disease, stage 2 (mild): Secondary | ICD-10-CM | POA: Diagnosis not present

## 2023-08-14 DIAGNOSIS — M11241 Other chondrocalcinosis, right hand: Secondary | ICD-10-CM | POA: Diagnosis not present

## 2023-08-14 DIAGNOSIS — Z9884 Bariatric surgery status: Secondary | ICD-10-CM | POA: Diagnosis not present

## 2023-08-14 DIAGNOSIS — M1120 Other chondrocalcinosis, unspecified site: Secondary | ICD-10-CM | POA: Diagnosis not present

## 2023-08-14 DIAGNOSIS — R053 Chronic cough: Secondary | ICD-10-CM | POA: Diagnosis not present

## 2023-08-14 DIAGNOSIS — D72818 Other decreased white blood cell count: Secondary | ICD-10-CM | POA: Diagnosis not present

## 2023-08-14 DIAGNOSIS — E1122 Type 2 diabetes mellitus with diabetic chronic kidney disease: Secondary | ICD-10-CM | POA: Diagnosis not present

## 2023-08-14 DIAGNOSIS — Z7985 Long-term (current) use of injectable non-insulin antidiabetic drugs: Secondary | ICD-10-CM | POA: Diagnosis not present

## 2023-08-14 DIAGNOSIS — T380X5A Adverse effect of glucocorticoids and synthetic analogues, initial encounter: Secondary | ICD-10-CM | POA: Diagnosis not present

## 2023-08-14 MED ORDER — CANAGLIFLOZIN 300 MG PO TABS: 300.0000 mg | ORAL_TABLET | Freq: Every day | ORAL | 5 refills | Status: DC

## 2023-08-14 MED ORDER — ONETOUCH ULTRA VI STRP
ORAL_STRIP | 2 refills | Status: DC
Start: 2023-08-14 — End: 2023-08-22

## 2023-08-15 ENCOUNTER — Telehealth: Payer: Self-pay | Admitting: Family Medicine

## 2023-08-15 DIAGNOSIS — E875 Hyperkalemia: Secondary | ICD-10-CM | POA: Diagnosis not present

## 2023-08-15 DIAGNOSIS — M1120 Other chondrocalcinosis, unspecified site: Secondary | ICD-10-CM | POA: Diagnosis not present

## 2023-08-15 DIAGNOSIS — N179 Acute kidney failure, unspecified: Secondary | ICD-10-CM | POA: Diagnosis not present

## 2023-08-15 DIAGNOSIS — E1165 Type 2 diabetes mellitus with hyperglycemia: Secondary | ICD-10-CM | POA: Diagnosis not present

## 2023-08-15 NOTE — Telephone Encounter (Signed)
Recommend she check with insuance to see what is covered.  Savannah [name]. [name], MD 08/15/2023 1:57 PM

## 2023-08-15 NOTE — Telephone Encounter (Signed)
Alternative Requested:NON FORMULARY. 

## 2023-08-15 NOTE — Telephone Encounter (Signed)
Dr. Allena Katz requests to be called at ph#  [phone]  re: Patient  States not urgent

## 2023-08-16 NOTE — Telephone Encounter (Signed)
I do not see Dr Allena Katz in her care team. If they call back can we get more information about what is needed? My schedule is very busy and I am not sure when I will be able to call.  Savannah [name]. [name], MD 08/16/2023 7:30 AM

## 2023-08-16 NOTE — Telephone Encounter (Signed)
See PCP note.

## 2023-08-19 ENCOUNTER — Telehealth: Payer: Self-pay | Admitting: *Deleted

## 2023-08-19 NOTE — Telephone Encounter (Signed)
Patient contacted the office stating she has had a recent gout flare in her hands, neck and knee. Patient states she was seen in the emergency room and was given Solumedrol and prednisone. Patient states she was admitted due to her kidney function and the attending doctor advised her not to take the Colchicine due to her kidney function. Patient states she is still having trouble in her wrist. Patient scheduled for an evaluation and possible injection on 08/20/2023.

## 2023-08-20 ENCOUNTER — Ambulatory Visit: Payer: Medicare Other | Attending: Rheumatology | Admitting: Rheumatology

## 2023-08-20 ENCOUNTER — Encounter: Payer: Self-pay | Admitting: Rheumatology

## 2023-08-20 VITALS — BP 111/73 | HR 71 | Ht 71.0 in | Wt 170.0 lb

## 2023-08-20 DIAGNOSIS — M1990 Unspecified osteoarthritis, unspecified site: Secondary | ICD-10-CM

## 2023-08-20 DIAGNOSIS — K200 Eosinophilic esophagitis: Secondary | ICD-10-CM | POA: Diagnosis not present

## 2023-08-20 DIAGNOSIS — Z5181 Encounter for therapeutic drug level monitoring: Secondary | ICD-10-CM

## 2023-08-20 DIAGNOSIS — M19041 Primary osteoarthritis, right hand: Secondary | ICD-10-CM

## 2023-08-20 DIAGNOSIS — Z8669 Personal history of other diseases of the nervous system and sense organs: Secondary | ICD-10-CM

## 2023-08-20 DIAGNOSIS — M5030 Other cervical disc degeneration, unspecified cervical region: Secondary | ICD-10-CM | POA: Diagnosis not present

## 2023-08-20 DIAGNOSIS — E559 Vitamin D deficiency, unspecified: Secondary | ICD-10-CM

## 2023-08-20 DIAGNOSIS — Z8719 Personal history of other diseases of the digestive system: Secondary | ICD-10-CM

## 2023-08-20 DIAGNOSIS — Z96651 Presence of right artificial knee joint: Secondary | ICD-10-CM

## 2023-08-20 DIAGNOSIS — K7581 Nonalcoholic steatohepatitis (NASH): Secondary | ICD-10-CM | POA: Diagnosis not present

## 2023-08-20 DIAGNOSIS — M7061 Trochanteric bursitis, right hip: Secondary | ICD-10-CM | POA: Diagnosis not present

## 2023-08-20 DIAGNOSIS — M810 Age-related osteoporosis without current pathological fracture: Secondary | ICD-10-CM

## 2023-08-20 DIAGNOSIS — M1712 Unilateral primary osteoarthritis, left knee: Secondary | ICD-10-CM

## 2023-08-20 DIAGNOSIS — M19042 Primary osteoarthritis, left hand: Secondary | ICD-10-CM | POA: Diagnosis not present

## 2023-08-20 DIAGNOSIS — Z9884 Bariatric surgery status: Secondary | ICD-10-CM | POA: Diagnosis not present

## 2023-08-20 DIAGNOSIS — M51369 Other intervertebral disc degeneration, lumbar region without mention of lumbar back pain or lower extremity pain: Secondary | ICD-10-CM

## 2023-08-20 DIAGNOSIS — Z8639 Personal history of other endocrine, nutritional and metabolic disease: Secondary | ICD-10-CM

## 2023-08-20 DIAGNOSIS — Z9049 Acquired absence of other specified parts of digestive tract: Secondary | ICD-10-CM | POA: Diagnosis not present

## 2023-08-20 DIAGNOSIS — N1831 Chronic kidney disease, stage 3a: Secondary | ICD-10-CM | POA: Diagnosis present

## 2023-08-20 DIAGNOSIS — E890 Postprocedural hypothyroidism: Secondary | ICD-10-CM

## 2023-08-20 DIAGNOSIS — R7611 Nonspecific reaction to tuberculin skin test without active tuberculosis: Secondary | ICD-10-CM

## 2023-08-20 DIAGNOSIS — M19071 Primary osteoarthritis, right ankle and foot: Secondary | ICD-10-CM

## 2023-08-20 DIAGNOSIS — N1832 Chronic kidney disease, stage 3b: Secondary | ICD-10-CM | POA: Diagnosis not present

## 2023-08-20 DIAGNOSIS — Z8542 Personal history of malignant neoplasm of other parts of uterus: Secondary | ICD-10-CM | POA: Diagnosis not present

## 2023-08-20 DIAGNOSIS — M19072 Primary osteoarthritis, left ankle and foot: Secondary | ICD-10-CM | POA: Diagnosis not present

## 2023-08-20 DIAGNOSIS — M7062 Trochanteric bursitis, left hip: Secondary | ICD-10-CM | POA: Insufficient documentation

## 2023-08-20 NOTE — Progress Notes (Signed)
Office Visit Note  Patient: Savannah [name]             Date of Birth: 14-Aug-1956           MRN: [id number]             PCP: Ardith Dark, MD Referring: Ardith Dark, MD Visit Date: 08/20/2023 Occupation: @GUAROCC @  Subjective:  Pain in joints  History of Present Illness: [name] is a 67 y.o. female with history of inflammatory arthritis most likely pseudogout and osteoarthritis.  She states about 2 weeks ago she drove about 14 hours and after that she started having pain and discomfort in her neck, bilateral hands and wrist and her knee joints.  She stated it was difficult for her to move.  She went to the emergency room in [place].  She was given Solu-Medrol intramuscularly, Valium, Robaxin and hydrocodone.  She states 2 days later she again went to the emergency room with severe upper respiratory tract infection.  She was found to be dehydrated and was hospitalized for IV fluids.  He was also started on antibiotics.  She flew back home.  She was also given a prednisone taper.  She states that her joint pain has gradually improved.  She had been taking colchicine 0.6 mg p.o. twice daily but after the hospitalization to reduce the dose of colchicine to 0.3 mg p.o. twice daily which she stopped 3 days ago.  She was seen by nephrologist 2 months ago here.  She has a follow-up appointment in November.  She will have labs today.    Activities of Daily Living:  Patient reports morning stiffness for less than 1 hour.   Patient Reports nocturnal pain.  Difficulty dressing/grooming: Denies Difficulty climbing stairs: Reports Difficulty getting out of chair: Reports Difficulty using hands for taps, buttons, cutlery, and/or writing: Reports  Review of Systems  Constitutional:  Positive for fatigue.  HENT:  Positive for mouth dryness. Negative for mouth sores.   Eyes:  Positive for dryness.  Respiratory:  Positive for cough. Negative for shortness of breath.   Cardiovascular:  Positive  for palpitations and irregular heartbeat. Negative for chest pain.  Gastrointestinal:  Negative for blood in stool, constipation and diarrhea.  Endocrine: Negative for increased urination.  Genitourinary:  Negative for involuntary urination.  Musculoskeletal:  Positive for joint pain, gait problem, joint pain and morning stiffness. Negative for joint swelling, myalgias, muscle weakness, muscle tenderness and myalgias.  Skin:  Positive for hair loss. Negative for color change, rash and sensitivity to sunlight.  Allergic/Immunologic: Positive for susceptible to infections.  Neurological:  Positive for dizziness. Negative for headaches.  Hematological:  Negative for swollen glands.  Psychiatric/Behavioral:  Positive for depressed mood and sleep disturbance. The patient is not nervous/anxious.     PMFS History:  Patient Active Problem List   Diagnosis Date Noted   Bilateral carpal tunnel syndrome 03/13/2022   Leg edema 03/01/2022   Nondisplaced comminuted fracture of left patella, initial encounter for closed fracture 08/01/2021   Arthritis of carpometacarpal [facility]) joint of right thumb 07/28/2021   Primary osteoarthritis of right distal radioulnar joint 07/28/2021   Arthritis of wrist, right, degenerative 07/28/2021   History of malignant neoplasm of uterine body 07/27/2021   Osteopenia 01/14/2021   Sesamoiditis of left foot 11/09/2020   Posterior tibial tendinitis of left lower extremity 11/09/2020   Type 2 diabetes mellitus with diabetic peripheral angiopathy without gangrene, with long-term current use of insulin (HCC) 10/21/2020  Iron deficiency anemia 03/14/2020   Lesion of vulva 09/02/2019   Menorrhagia 09/02/2019   Neuropathy 09/02/2019   Irregular bowel habits 06/25/2019   Coronary artery disease involving native coronary artery of native heart without angina pectoris    Temporomandibular joint disorder 12/05/2018   Fatigue 12/05/2018   Inverted nipple 12/05/2018   Senile  purpura (HCC) 11/21/2018   GERD with esophagitis 11/21/2018   Pseudogout involving multiple joints 11/21/2018   Lipoma 07/22/2017   Lichen sclerosus 11/16/2016   H/O gastric bypass 11/16/2016   Depression 11/16/2016   Diabetic polyneuropathy associated with type 2 diabetes mellitus (HCC) 11/16/2016   Uterine cancer (HCC) s/p hysterectomy 1997 01/04/2012   Postsurgical hypothyroidism 09/19/2010   Diabetic neuropathy (HCC) 11/21/2007   Allergic rhinitis 11/21/2007   Dyslipidemia associated with type 2 diabetes mellitus (HCC) 07/08/2007    Past Medical History:  Diagnosis Date   ABDOMINAL PAIN, CHRONIC 10/20/2009   Acute cystitis 08/17/2009   ALLERGIC RHINITIS 11/21/2007   Allergy    ASYMPTOMATIC POSTMENOPAUSAL STATUS 09/29/2008   Bariatric surgery status 07/07/2010   Bariatric surgery status 07/07/2010   Qualifier: Diagnosis of  By: Everardo All MD, Sean A    Cancer [facility])    uterine   Cataract    Coronary artery disease    DEPRESSION 11/21/2007   DIABETES MELLITUS, TYPE II 07/08/2007   DYSPNEA 05/20/2009   Edema 05/20/2009   Eosinophilic esophagitis 02/13/2008   FEVER UNSPECIFIED 10/20/2009   FEVER, HX OF 03/09/2010   GERD (gastroesophageal reflux disease)    Headache(784.0) 02/06/2010   Hepatomegaly 01/06/2008   HYPERLIPIDEMIA 07/08/2007   HYPERTENSION 07/08/2007   Kidney disease    followed by nephrology, per patient   LEUKOPENIA, MILD 09/29/2008   Lymphedema    left leg, dx approximately in 03/2023 per patient   Melanoma in situ of left upper arm (HCC) 04/03/2023   OBESITY 01/06/2008   OSTEOARTHRITIS 01/06/2008   Other chronic nonalcoholic liver disease 01/06/2008   PERIPHERAL NEUROPATHY 11/21/2007   Peripheral neuropathy    Postsurgical hypothyroidism 09/19/2010   SINUSITIS- ACUTE-NOS 11/21/2007   TB SKIN TEST, POSITIVE 02/06/2010   THYROID NODULE 03/09/2010    Family History  Problem Relation Age of Onset   Diabetes Mother    Neuropathy Mother    Diabetes  Father    Congestive Heart Failure Father    Hypertension Father    Myelodysplastic syndrome Father    Bladder Cancer Sister    Multiple sclerosis Sister    Breast cancer Sister        breast onset age 49   Osteoporosis Sister    Liver cancer Maternal Aunt    Breast cancer Maternal Aunt    Diabetes Maternal Aunt    Colon cancer Maternal Uncle    Diabetes Maternal Uncle    Neuropathy Paternal Aunt    Diabetes Maternal Grandmother    Diabetes Paternal Grandmother    Past Surgical History:  Procedure Laterality Date   ABDOMINAL HYSTERECTOMY     BASAL CELL CARCINOMA EXCISION     CARDIAC CATHETERIZATION     CHOLECYSTECTOMY N/A 05/11/2021   Procedure: LAPAROSCOPIC CHOLECYSTECTOMY WITH POSSIBLE  INTRAOPERATIVE CHOLANGIOGRAM;  Surgeon: Luretha Murphy, MD;  Location: [facility] ORS;  Service: General;  Laterality: N/A;   COLONOSCOPY     CORONARY ATHERECTOMY N/A 05/03/2022   Procedure: CORONARY ATHERECTOMY;  Surgeon: Orbie Pyo, MD;  Location: [facility] INVASIVE CV LAB;  Service: Cardiovascular;  Laterality: N/A;   CORONARY IMAGING/OCT N/A 05/03/2022   Procedure: INTRAVASCULAR IMAGING/OCT;  Surgeon: Orbie Pyo, MD;  Location: [facility] INVASIVE CV LAB;  Service: Cardiovascular;  Laterality: N/A;   CORONARY PRESSURE/FFR STUDY N/A 05/03/2022   Procedure: INTRAVASCULAR PRESSURE WIRE/FFR STUDY;  Surgeon: Orbie Pyo, MD;  Location: [facility] INVASIVE CV LAB;  Service: Cardiovascular;  Laterality: N/A;   CORONARY STENT INTERVENTION N/A 05/03/2022   Procedure: CORONARY STENT INTERVENTION;  Surgeon: Orbie Pyo, MD;  Location: [facility] INVASIVE CV LAB;  Service: Cardiovascular;  Laterality: N/A;  lad   EYE SURGERY Bilateral 08/2018, 09/2018   FOOT SURGERY Left    10/2019   KNEE ARTHROSCOPY Left    LEFT HEART CATH AND CORONARY ANGIOGRAPHY N/A 01/20/2019   Procedure: LEFT HEART CATH AND CORONARY ANGIOGRAPHY;  Surgeon: Lyn Records, MD;  Location: [facility] INVASIVE CV LAB;  Service: Cardiovascular;  Laterality:  N/A;   LYMPH NODE DISSECTION     OOPHORECTOMY     Right total knee replacement     RIGHT/LEFT HEART CATH AND CORONARY ANGIOGRAPHY N/A 05/03/2022   Procedure: RIGHT/LEFT HEART CATH AND CORONARY ANGIOGRAPHY;  Surgeon: Orbie Pyo, MD;  Location: [facility] INVASIVE CV LAB;  Service: Cardiovascular;  Laterality: N/A;   ROUX-EN-Y GASTRIC BYPASS  2011   SKIN TAG REMOVAL     THYROIDECTOMY     TONSILLECTOMY AND ADENOIDECTOMY     TOOTH EXTRACTION  2023   UPPER GASTROINTESTINAL ENDOSCOPY     UPPER GI ENDOSCOPY  2024   Social History   Social History Narrative   Not on file   Immunization History  Administered Date(s) Administered   Fluad Quad(high Dose 65+) 07/13/2019, 07/17/2022   Influenza Inj Mdck Quad Pf 10/19/2017, 07/23/2018   Influenza Whole 07/30/2008, 08/17/2009, 09/19/2010   Influenza, High Dose Seasonal PF 08/15/2021   Influenza,inj,Quad PF,6-35 Mos 07/14/2015   Influenza-Unspecified 10/13/2011, 08/12/2013, 07/13/2017, 07/23/2018   Moderna Covid-19 Fall Seasonal Vaccine 29yrs & older 10/06/2022   PFIZER(Purple Top)SARS-COV-2 Vaccination 12/21/2019, 01/14/2020, 08/18/2020   Pfizer Covid-19 Vaccine Bivalent Booster 66yrs & up 08/15/2021   Pneumococcal Conjugate-13 10/14/2015   Pneumococcal Polysaccharide-23 06/25/2014, 10/21/2020   Respiratory Syncytial Virus Vaccine,Recomb Aduvanted(Arexvy) 07/17/2022   Tdap 05/20/2020   Zoster Recombinant(Shingrix) 05/20/2020, 10/06/2022   Zoster, Live 06/25/2014     Objective: Vital Signs: BP 111/73 (BP Location: Left Arm, Patient Position: Sitting, Cuff Size: Normal)   Pulse 71   Ht 5\' 11"  (1.803 m)   Wt 170 lb (77.1 kg) Comment: per patient  BMI 23.71 kg/m    Physical Exam Vitals and nursing note reviewed.  Constitutional:      Appearance: She is well-developed.  HENT:     Head: Normocephalic and atraumatic.  Eyes:     Conjunctiva/sclera: Conjunctivae normal.  Cardiovascular:     Rate and Rhythm: Normal rate and regular  rhythm.     Heart sounds: Normal heart sounds.  Pulmonary:     Effort: Pulmonary effort is normal.     Breath sounds: Normal breath sounds.  Abdominal:     General: Bowel sounds are normal.     Palpations: Abdomen is soft.  Musculoskeletal:     Cervical back: Normal range of motion.  Lymphadenopathy:     Cervical: No cervical adenopathy.  Skin:    General: Skin is warm and dry.     Capillary Refill: Capillary refill takes less than 2 seconds.  Neurological:     Mental Status: She is alert and oriented to person, place, and time.  Psychiatric:        Behavior: Behavior normal.  Musculoskeletal Exam: She had limited lateral rotation of the cervical spine without much discomfort.  She had painful range of motion of her lumbar spine without any point tenderness.  Shoulder joints, elbow joints, wrist joints, MCPs PIPs and DIPs were in good range of motion.  She had bilateral CMC PIP and DIP thickening with no synovitis.  She had discomfort range of motion of her hip joints due to lower back pain.  Right knee joint was replaced.  Left knee joint was in good range of motion without any warmth or swelling.  There was no tenderness over ankles or MTPs.  She had bilateral pitting edema on the lower extremities.  CDAI Exam: CDAI Score: -- Patient Global: --; Provider Global: -- Swollen: --; Tender: -- Joint Exam 08/20/2023   No joint exam has been documented for this visit   There is currently no information documented on the homunculus. Go to the Rheumatology activity and complete the homunculus joint exam.  Investigation: No additional findings.  Imaging: No results found.  Recent Labs: Lab Results  Component Value Date   WBC 5.2 04/03/2023   HGB 12.1 04/03/2023   PLT 215.0 04/03/2023   NA 138 04/03/2023   K 5.0 04/03/2023   CL 107 04/03/2023   CO2 23 04/03/2023   GLUCOSE 171 (H) 04/03/2023   BUN 27 (H) 04/03/2023   CREATININE 1.78 (H) 04/03/2023   BILITOT 0.4  04/03/2023   ALKPHOS 105 04/03/2023   AST 35 04/03/2023   ALT 51 (H) 04/03/2023   PROT 6.7 04/03/2023   ALBUMIN 4.1 04/03/2023   CALCIUM 9.1 04/03/2023   GFRAA 73 08/16/2020    Speciality Comments: No specialty comments available.  Procedures:  No procedures performed Allergies: Ace inhibitors, Caffeine, Ciprofloxacin, Morphine and codeine, Mucinex [guaifenesin er], Nsaids, Other, Silicone, and Tape   Assessment / Plan:     Visit Diagnoses: Inflammatory arthritis - RF-, anti-CCP-, 14-3-3 eta-, ANA-, HLAB27-, uric acid 3.8. Responsive to colchicine-likely she has pseudogout: Patient reports having a flare after driving for 14 hours with pain and stiffness in her neck, hands, wrist and knee joints.  She was given Solu-Medrol at the emergency room in [place].  She was also given muscle relaxers, hydrocodone and colchicine.  She states the symptoms gradually improved on the prednisone taper.  She has been off colchicine for the last 3 days.  She had no synovitis on the examination today.  She states she was also hospitalized due to dehydration when she had upper respiratory tract infection.  She was treated with antibiotics.  Her creatinine was elevated while she was in the hospital per patient.  Patient was advised to contact us if she develops any increased swelling.  Medication monitoring encounter - Colchicine 0.6 mg 1 capsule daily on as needed basis.  She stopped colchicine about 3 days ago.  She had labs in May which was stable.  She is followed by nephrology.  Primary osteoarthritis of both hands -she continues to have intermittent pain and discomfort.  No synovitis was noted on the examination today.  CMC, PIP, DIP thickening consistent with osteoarthritis of both hands.  Joint with action was discussed.-She has intermittent pain.  Trochanteric bursitis of both hips-she has intermittent pain.  IT band stretches were advised.  Status post total right knee replacement - by Dr. Ranell Patrick.   She has leg length discrepancy.  Primary osteoarthritis of left knee -she has intermittent discomfort in her left knee.  No warmth swelling or effusion was noted today.  She is followed by Dr. Roda Shutters.  Primary osteoarthritis of both feet -she denies any discomfort in her feet.  She has significant pedal edema  DDD (degenerative disc disease), cervical-she had increased pain and discomfort in her cervical spine after driving for 14 hours.  The pain gradually improved.  Degeneration of intervertebral disc of lumbar region, unspecified whether pain present -chronic pain.  MRI done by Dr. Lucia Gaskins from July 07, 2021 showed degenerative changes and facet joint arthropathy.  Age-related osteoporosis without current pathological fracture - DEXA updated on 01/11/21: Right femoral neck- T-score -2.5 and a -25.9% change in BMD from previous DEXA. DEXA ordered and followed by PCP.  Vitamin D deficiency  Stage IIIa chronic kidney disease-her GFR is in 40s and 50s.  She has been followed by nephrology.  Patient states that she has an appointment with the nephrologist in November.  She will be getting labs today for nephrologist.  Other medical problems are listed as follows: NASH (nonalcoholic steatohepatitis)  History of peripheral neuropathy - gabapentin 300 mg 4 capsules by mouth daily for management of neuropathy.  History of diabetes mellitus, type II  Postsurgical hypothyroidism  Positive PPD  History of hyperlipidemia  Eosinophilic esophagitis  History of uterine cancer  Status post cholecystectomy  History of gastroesophageal reflux (GERD)  Status post gastric bypass for obesity  Orders: No orders of the defined types were placed in this encounter.  No orders of the defined types were placed in this encounter.   Follow-Up Instructions: Return for Osteoarthritis.   Pollyann Savoy, MD  Note - This record has been created using Animal nutritionist.  Chart creation errors have been  sought, but may not always  have been located. Such creation errors do not reflect on  the standard of medical care.

## 2023-08-22 ENCOUNTER — Telehealth: Payer: Self-pay | Admitting: Family Medicine

## 2023-08-22 ENCOUNTER — Ambulatory Visit: Payer: Medicare Other | Admitting: Family Medicine

## 2023-08-22 ENCOUNTER — Encounter: Payer: Self-pay | Admitting: Family Medicine

## 2023-08-22 ENCOUNTER — Other Ambulatory Visit: Payer: Self-pay | Admitting: Family Medicine

## 2023-08-22 VITALS — BP 114/77 | HR 79 | Temp 97.1°F | Ht 71.0 in

## 2023-08-22 DIAGNOSIS — E1151 Type 2 diabetes mellitus with diabetic peripheral angiopathy without gangrene: Secondary | ICD-10-CM

## 2023-08-22 DIAGNOSIS — J029 Acute pharyngitis, unspecified: Secondary | ICD-10-CM

## 2023-08-22 DIAGNOSIS — Z794 Long term (current) use of insulin: Secondary | ICD-10-CM

## 2023-08-22 DIAGNOSIS — E1140 Type 2 diabetes mellitus with diabetic neuropathy, unspecified: Secondary | ICD-10-CM | POA: Diagnosis not present

## 2023-08-22 DIAGNOSIS — E890 Postprocedural hypothyroidism: Secondary | ICD-10-CM

## 2023-08-22 DIAGNOSIS — M1189 Other specified crystal arthropathies, multiple sites: Secondary | ICD-10-CM

## 2023-08-22 MED ORDER — BLOOD GLUCOSE MONITORING SUPPL DEVI: 1.0000 | Freq: Three times a day (TID) | 0 refills | Status: DC

## 2023-08-22 MED ORDER — ONETOUCH ULTRA VI STRP
ORAL_STRIP | 2 refills | Status: DC
Start: 2023-08-22 — End: 2023-08-22

## 2023-08-22 MED ORDER — AZITHROMYCIN 250 MG PO TABS: ORAL_TABLET | ORAL | 0 refills | Status: DC

## 2023-08-22 MED ORDER — BENZONATATE 200 MG PO CAPS: 200.0000 mg | ORAL_CAPSULE | Freq: Two times a day (BID) | ORAL | 0 refills | Status: DC | PRN

## 2023-08-22 MED ORDER — REPAGLINIDE 2 MG PO TABS: 2.0000 mg | ORAL_TABLET | Freq: Three times a day (TID) | ORAL | 3 refills | Status: DC

## 2023-08-22 NOTE — Assessment & Plan Note (Signed)
Stable on Synthroid 100 mcg daily.  Will be referring her to endocrinology as above.  She last saw them for this over a year ago.

## 2023-08-22 NOTE — Telephone Encounter (Signed)
Savannah [name] from [facility] called stating :  - PATIENT REQUESTED THE ONE TOUCH VERIO, not the ultra  - Monitor and lancets be sent in with One touch Verio  - Frequency instructions and associated diagnosis codes be included  - REPAGLINIDE RX ORDER WASN'T RECEIVED--PLEASE RESEND

## 2023-08-22 NOTE — Assessment & Plan Note (Signed)
Following with rheumatology.  She had a flareup related to long distance driving.  It is possible her upper respiratory infection (URI) and dehydration could have exacerbated this as well.  She is on colchicine per rheumatology.  Symptoms are improving.

## 2023-08-22 NOTE — Patient Instructions (Signed)
It was very nice to see you today!  Please start the azithromycin and Tessalon.  It is okay for you to stay off of the metformin.  I will refill your repaglinide.  I will refer you to see the endocrinologist.  Please come back to see me if it will take several months before you can see them.  Please make sure that you are staying well-hydrated.  I will refill your insulin test strips and glucometer.  Return if symptoms worsen or fail to improve.   Take care, Dr [name]  PLEASE NOTE:  If you had any lab tests, please let us know if you have not heard back within a few days. You may see your results on mychart before we have a chance to review them but we will give you a call once they are reviewed by [place].   If we ordered any referrals today, please let us know if you have not heard from their office within the next week.   If you had any urgent prescriptions sent in today, please check with the pharmacy within an hour of our visit to make sure the prescription was transmitted appropriately.   Please try these tips to maintain a healthy lifestyle:  Eat at least 3 REAL meals and 1-2 snacks per day.  Aim for no more than 5 hours between eating.  If you eat breakfast, please do so within one hour of getting up.   Each meal should contain half fruits/vegetables, one quarter protein, and one quarter carbs (no bigger than a computer mouse)  Cut down on sweet beverages. This includes juice, soda, and sweet tea.   Drink at least 1 glass of water with each meal and aim for at least 8 glasses per day  Exercise at least 150 minutes every week.

## 2023-08-22 NOTE — Assessment & Plan Note (Signed)
She has had multiple rounds of Solu-Medrol and prednisone within the last few weeks we will not check A1c today.  It is okay for her to stay off of her metformin for now given that this was causing some GI side effects and additionally her GFR has been in the borderline range.  She will continue her other medications including Trulicity 4.5 mg weekly, Invokana 150 mg daily, and repaglinide 2 mg 3 times daily.  She previously saw endocrinology and is interested in establishing again.  Will place referral today.  If she cannot see them in a timely manner she will come back here to recheck A1c.  We did discuss potentially starting insulin back however we will hold off on this until we recheck her A1c.  Will refill her diabetic testing supplies and she will let us know if she is having any significant high readings at home.

## 2023-08-22 NOTE — Progress Notes (Signed)
Savannah [name] is a 67 y.o. female who presents today for an office visit.  Assessment/Plan:  New/Acute Problems: Sinusitis  No red flags however given length of symptoms would be reasonable to give another course of antibiotics.  She has tolerated azithromycin well in the past.  Will start azithromycin.  Also start Tessalon.  Encouraged hydration.  She will let us know if not improving. Discussed reasons to return to care. Follow up as needed.   Dehydration/AKI Resolved on labs from a couple of days ago.  We reviewed these today and [facility] DXA.  It is okay for her to stop her to stop her sodium bicarbonate.  Chronic Problems Addressed Today: Type 2 diabetes mellitus with diabetic peripheral angiopathy without gangrene, with long-term current use of insulin (HCC) She has had multiple rounds of Solu-Medrol and prednisone within the last few weeks we will not check A1c today.  It is okay for her to stay off of her metformin for now given that this was causing some GI side effects and additionally her GFR has been in the borderline range.  She will continue her other medications including Trulicity 4.5 mg weekly, Invokana 150 mg daily, and repaglinide 2 mg 3 times daily.  She previously saw endocrinology and is interested in establishing again.  Will place referral today.  If she cannot see them in a timely manner she will come back here to recheck A1c.  We did discuss potentially starting insulin back however we will hold off on this until we recheck her A1c.  Will refill her diabetic testing supplies and she will let us know if she is having any significant high readings at home.   Postsurgical hypothyroidism Stable on Synthroid 100 mcg daily.  Will be referring her to endocrinology as above.  She last saw them for this over a year ago.  Pseudogout involving multiple joints Following with rheumatology.  She had a flareup related to long distance driving.  It is possible her upper respiratory  infection (URI) and dehydration could have exacerbated this as well.  She is on colchicine per rheumatology.  Symptoms are improving.     Subjective:  HPI:  See A/P for status of chronic conditions.  Patient is here today for follow-up.  We saw her a few months ago.  Since her last visit she had to go to a local emergency room in [place] while out of town.  She did a lot of driving and she thinks that this caused a severe flareup of joint pain due to her pseudogout.  She was given Solu-Medrol, Valium, Robaxin, and hydrocodone.  She had continued symptoms including malaise and fatigue and went back a couple days later with URI and was hospitalized for dehydration.  Started on antibiotics and prednisone taper while there.   Symptoms improved and she was discharged home.  She flew back home.  She saw her rheumatologist here in [place] a couple of days ago. Her joint pains have improved significantly.   Hospitalized she was noted to have elevated creatinine and acidosis.  They started her on sodium bicarbonate and stopped her metformin.  She had labs done a couple of days ago which showed return of GFR back to baseline and resolution of her acidosis.  For her diabetes, she has continued on Trulicity 4.5 mg daily, invokana 150 mg daily, and repaglinide 2 mg three times daily. She has had some improvement in her GI symptoms since stopping her metformin.   She still has a  lot of malaise and fatigue but this is improving. Still having a lot of nasal congestion and mucus production.  Feels like she still has a lingering sinus infection.  She did not complete her course of antibiotics.       Objective:  Physical Exam: BP 114/77   Pulse 79   Temp (!) 97.1 F (36.2 C)   Ht 5\' 11"  (1.803 m)   SpO2 100%   BMI 23.71 kg/m   Gen: No acute distress, resting comfortably CV: Regular rate and rhythm with no murmurs appreciated Pulm: Normal work of breathing, clear to auscultation bilaterally with no  crackles, wheezes, or rhonchi Neuro: Grossly normal, moves all extremities Psych: Normal affect and thought content      Tiffiny Worthy M. [name], MD 08/22/2023 11:51 AM

## 2023-08-27 ENCOUNTER — Other Ambulatory Visit: Payer: Self-pay | Admitting: *Deleted

## 2023-08-27 MED ORDER — BLOOD GLUCOSE MONITOR KIT: PACK | 0 refills | Status: AC

## 2023-08-27 MED ORDER — BLOOD GLUCOSE MONITOR KIT: PACK | 0 refills | Status: DC

## 2023-08-27 NOTE — Telephone Encounter (Signed)
[facility]

## 2023-08-28 ENCOUNTER — Other Ambulatory Visit: Payer: Self-pay | Admitting: Internal Medicine

## 2023-08-28 ENCOUNTER — Other Ambulatory Visit: Payer: Self-pay | Admitting: Family Medicine

## 2023-08-29 ENCOUNTER — Other Ambulatory Visit (HOSPITAL_COMMUNITY): Payer: Self-pay | Admitting: *Deleted

## 2023-08-29 DIAGNOSIS — R059 Cough, unspecified: Secondary | ICD-10-CM

## 2023-08-29 DIAGNOSIS — R1310 Dysphagia, unspecified: Secondary | ICD-10-CM

## 2023-08-30 ENCOUNTER — Encounter: Payer: Self-pay | Admitting: Internal Medicine

## 2023-08-30 MED ORDER — ATORVASTATIN CALCIUM 40 MG PO TABS: 40.0000 mg | ORAL_TABLET | Freq: Every day | ORAL | 3 refills | Status: DC

## 2023-08-30 NOTE — Telephone Encounter (Signed)
Per last OV with Jacques Navy on 05/17/23:   Dyslipidemia - Total Cholesterol of 114, LDL of 53, and HDL of 41. LDL at goal. Trigs 114. - Continue Atorvastatin 40mg  daily.  Refill sent to Pharmacy at this time

## 2023-09-02 ENCOUNTER — Ambulatory Visit (HOSPITAL_COMMUNITY)
Admission: RE | Admit: 2023-09-02 | Discharge: 2023-09-02 | Disposition: A | Discharge status: Home / Self Care | Payer: Medicare Other | Source: Ambulatory Visit | Attending: Family Medicine | Admitting: Family Medicine

## 2023-09-02 ENCOUNTER — Encounter: Payer: Self-pay | Admitting: Family Medicine

## 2023-09-02 DIAGNOSIS — R1310 Dysphagia, unspecified: Secondary | ICD-10-CM

## 2023-09-02 DIAGNOSIS — R1013 Epigastric pain: Secondary | ICD-10-CM | POA: Diagnosis present

## 2023-09-02 DIAGNOSIS — K219 Gastro-esophageal reflux disease without esophagitis: Secondary | ICD-10-CM | POA: Insufficient documentation

## 2023-09-02 DIAGNOSIS — R059 Cough, unspecified: Secondary | ICD-10-CM | POA: Insufficient documentation

## 2023-09-02 DIAGNOSIS — R1314 Dysphagia, pharyngoesophageal phase: Secondary | ICD-10-CM

## 2023-09-02 NOTE — Telephone Encounter (Signed)
Please advise 

## 2023-09-02 NOTE — Telephone Encounter (Signed)
Ok to send in new prescription.  Savannah [name]. [name], MD 09/02/2023 1:01 PM

## 2023-09-02 NOTE — Therapy (Signed)
Modified Barium Swallow Study  Patient Details  Name: Savannah [name] MRN: [id number] Date of Birth: July 14, 1956  Today's Date: 09/02/2023  Modified Barium Swallow completed.  Full report located under Chart Review in the Imaging Section.  History of Present Illness Savannah [name] is a 67 y.o. female with PMH: HTN, HLD, GERD with esophagitis, LPR (laryngopharyngeal reflux), CAD, EOE, obesity, arthritis, DM-2, TMJ, depression. She presented to this outpatient modified barium swallow study secondary to h/o dysphagia. Per 06/25/23 visit to ENT, patient at that time reported "difficulty swallowing, particularly with solid foods and pills, which often get stuck in her throat, leading to choking" but denied any pain while swallowing, regurgitation of food or unexplained weight loss.   Clinical Impression Savannah [name] was seen by SLP for this modified barium swallow study per complaints of globus sensation, trouble swallowing pills, and changes in her voice. she presents with an overall functional oropharyngeal swallow as evidenced by the results of this study. There were no observed instances of aspiration or significant pharyngeal residuals. Penetration (PAS 4) occurred in a few instances with thin liquid administration and one instance with nectar thick liquid (PAS 2). Reduced hyolaryngeal excursion was noted, however epiglottic inversion was complete and adequately protected the airway. The pharyngoesophageal segment opening was observed as partially distended. Barium retrograde below the PES was present. Barium tablet was administered and noted to stall in the middle esophagus when taken with thin liquid. Honey-thick liquid completed tablet's transit. 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 SLP recommended Savannah [name] continues with a regular diet with thin liquids, taking medications with thicker liquids (e.g., smoothie) as needed. Acid reflux precautions (e.g., remain upright 30 minutes  after eating) were reviewed with Savannah [name] and she verbalized understanding. No further ST is indicated at this time.  Swallow Evaluation Recommendations Recommendations: PO diet PO Diet Recommendation: Regular;Thin liquids (Level 0) Liquid Administration via: Cup;Straw Medication Administration: Whole meds with liquid (as tolerated) Supervision: Patient able to self-feed Swallowing strategies  : Small bites/sips;Minimize environmental distractions;Slow rate Postural changes: Position pt fully upright for meals;Stay upright 30-60 min after meals Oral care recommendations: Oral care BID (2x/day)      Marline Backbone, B.S., Speech Therapy Student   09/02/2023,2:17 PM

## 2023-09-03 ENCOUNTER — Other Ambulatory Visit: Payer: Self-pay | Admitting: *Deleted

## 2023-09-03 MED ORDER — CANAGLIFLOZIN 300 MG PO TABS: 300.0000 mg | ORAL_TABLET | Freq: Every day | ORAL | 5 refills | Status: DC

## 2023-09-03 NOTE — Telephone Encounter (Signed)
Rx send in

## 2023-09-04 LAB — HM DIABETES EYE EXAM

## 2023-09-05 ENCOUNTER — Encounter: Payer: Self-pay | Admitting: Ophthalmology

## 2023-09-09 ENCOUNTER — Other Ambulatory Visit (HOSPITAL_COMMUNITY): Payer: Self-pay

## 2023-09-09 ENCOUNTER — Telehealth: Payer: Self-pay

## 2023-09-09 ENCOUNTER — Encounter: Payer: Self-pay | Admitting: Internal Medicine

## 2023-09-09 NOTE — Telephone Encounter (Signed)
Pharmacy Patient Advocate Encounter   Received notification from Patient Advice Request messages that prior authorization for Invokana 300mg  tabs is required/requested.   Insurance verification completed.   The patient is insured through [facility] .   Per test claim: PA required; PA submitted to [facility] via CoverMyMeds Key/confirmation #/EOC [id number] Status is pending

## 2023-09-09 NOTE — Telephone Encounter (Signed)
Please do a PA for Rx Invokana  Thanks

## 2023-09-11 ENCOUNTER — Ambulatory Visit (INDEPENDENT_AMBULATORY_CARE_PROVIDER_SITE_OTHER): Payer: Medicare Other | Admitting: Family

## 2023-09-11 ENCOUNTER — Other Ambulatory Visit (HOSPITAL_COMMUNITY): Payer: Self-pay

## 2023-09-11 VITALS — BP 117/76 | HR 68 | Temp 97.8°F | Ht 71.0 in | Wt 178.0 lb

## 2023-09-11 DIAGNOSIS — J309 Allergic rhinitis, unspecified: Secondary | ICD-10-CM | POA: Diagnosis not present

## 2023-09-11 DIAGNOSIS — K1379 Other lesions of oral mucosa: Secondary | ICD-10-CM | POA: Diagnosis not present

## 2023-09-11 DIAGNOSIS — J029 Acute pharyngitis, unspecified: Secondary | ICD-10-CM | POA: Diagnosis not present

## 2023-09-11 LAB — POCT RAPID STREP A (OFFICE): Rapid Strep A Screen: NEGATIVE

## 2023-09-11 MED ORDER — NYSTATIN 100000 UNIT/ML MT SUSP
5.0000 mL | Freq: Three times a day (TID) | ORAL | 0 refills | Status: AC | PRN
Start: 2023-09-11 — End: 2023-09-18

## 2023-09-11 MED ORDER — LEVOCETIRIZINE DIHYDROCHLORIDE 5 MG PO TABS
5.0000 mg | ORAL_TABLET | Freq: Every evening | ORAL | 1 refills | Status: DC
Start: 2023-09-11 — End: 2024-02-13

## 2023-09-11 NOTE — Progress Notes (Signed)
Patient ID: Savannah [name], female    DOB: 05/19/1956, 67 y.o.   MRN: [id number]  Chief Complaint  Patient presents with  . Sore Throat    Pt c/o sore throat and throat itching, Present for a month but has been getting worse.    *Discussed the use of AI scribe software for clinical note transcription with the patient, who gave verbal consent to proceed.  History of Present Illness   The patient, with a history of recurrent fungal infections, presents with a month-long history of an itchy throat, specifically the upper palate. The itchiness is severe enough to cause the patient to use a toothbrush for relief, which subsequently induces a cough. The patient has noticed a yellowish discoloration and a red patch in the throat. The patient has been prone to fungal infections in the past and has an ongoing prescription for Diflucan for yeast infections. The patient recently had a vaginal yeast infection, which was treated with Nystatin cream. The patient also reports occasional difficulty swallowing, which was particularly noticeable the previous night. The patient has been in contact with a great niece who had a strep infection. The patient also has a history of allergies to gold and titanium, which has been a concern with a recently fitted dental partial. The patient has not been wearing the partial due to discomfort and the accumulation of food. The patient also has allergies and takes Xyzal daily, but has been off it for a couple of days.     Assessment & Plan:     Oral Itching - Chronic itching of the upper palate, possibly related to recent dental work (partial fitting). Patient has a history of fungal infections and is prone to allergies. No visible white patches, but yellowish discoloration noted on hard palate. -Prescribe magic mouthwash (nystatin, lidocaine, antihistamine) for a week. -If no improvement, consider use of Diflucan. -Advise patient to consult with dentist regarding the issue and  the possibility of an acrylic partial.  Allergies w/postnasal drip - Patient has multiple allergies and is currently off Xyzal. Patient reports occasional coughing up of thick, gooey mucus-Resume Xyzal (levocetirizine) daily. -Send prescription for Xyzal to patient's pharmacy, pt knows to get OTC if not covered by insurance. -Continue current treatment with ipratropium nasal spray as needed.     Subjective:    Outpatient Medications Prior to Visit  Medication Sig Dispense Refill  . acetaminophen (TYLENOL) 500 MG tablet Take 2 tablets (1,000 mg total) by mouth every 6 (six) hours as needed for mild pain or fever. 30 tablet 0  . amoxicillin-clavulanate (AUGMENTIN) 875-125 MG tablet Take 1 tablet by mouth 2 (two) times daily.    [name] atorvastatin (LIPITOR) 40 MG tablet Take 1 tablet (40 mg total) by mouth daily. 90 tablet 3  . benzonatate (TESSALON) 200 MG capsule Take 1 capsule (200 mg total) by mouth 2 (two) times daily as needed for cough. 20 capsule 0  . blood glucose meter kit and supplies KIT Use up to three  times daily as directed. 1 each 0  . Blood Glucose Monitoring Suppl DEVI 1 each by Does not apply route in the morning, at noon, and at bedtime. May substitute to any manufacturer covered by patient's insurance. 1 each 0  . canagliflozin (INVOKANA) 300 MG TABS tablet Take 1 tablet (300 mg total) by mouth daily before breakfast. 30 tablet 5  . Cholecalciferol (VITAMIN D) 50 MCG (2000 UT) tablet Take 4,000 Units by mouth in the morning.    [name]  clopidogrel (PLAVIX) 75 MG tablet TAKE 1 TABLET BY MOUTH EVERY DAY 90 tablet 3  . Colchicine (MITIGARE) 0.6 MG CAPS Take by mouth as needed.    . cyclobenzaprine (FLEXERIL) 10 MG tablet Take 10 mg by mouth at bedtime as needed for muscle spasms.    . diclofenac sodium (VOLTAREN) 1 % GEL Apply 4 g topically 4 (four) times daily. 100 g 11  . Dulaglutide (TRULICITY) 4.5 MG/0.5ML SOPN Inject 4.5 mg as directed once a week. 6 mL 3  . Halobetasol & Lactic  Acid (ULTRAVATE X, OINTMENT, EX) Apply 1 Application topically 2 (two) times daily.    [name] HYDROcodone-acetaminophen (NORCO/VICODIN) 5-325 MG tablet Take 1 tablet by mouth as needed for moderate pain.    [name] levalbuterol (XOPENEX HFA) 45 MCG/ACT inhaler Inhale 1 puff into the lungs every 4 (four) hours as needed for wheezing. 1 each 12  . levocetirizine (XYZAL) 5 MG tablet Take 5 mg by mouth every evening.    [name] levothyroxine (SYNTHROID) 100 MCG tablet Take 1 tablet (100 mcg total) by mouth daily. 90 tablet 3  . lidocaine (LIDODERM) 5 % PLACE 1 PATCH ONTO THE SKIN DAILY. REMOVE & DISCARD PATCH WITHIN 12 HOURS OR AS DIRECTED BY MD 30 patch 0  . MAGNESIUM PO Take 3 tablets by mouth daily as needed (foot cramps).    . methocarbamol (ROBAXIN) 750 MG tablet Take 750 mg by mouth every 6 (six) hours as needed.    . Multiple Vitamin (MULTIVITAMIN) tablet Take 1 tablet by mouth in the morning and at bedtime.    . nitroGLYCERIN (NITROSTAT) 0.4 MG SL tablet Place 1 tablet (0.4 mg total) under the tongue every 5 (five) minutes as needed. 25 tablet 3  . ONETOUCH ULTRA test strip USE AS INSTRUCTED 100 strip 2  . pantoprazole (PROTONIX) 40 MG tablet TAKE 1 TABLET BY MOUTH TWICE A DAY 180 tablet 1  . repaglinide (PRANDIN) 2 MG tablet Take 1 tablet (2 mg total) by mouth 3 (three) times daily before meals. 270 tablet 3  . traMADol (ULTRAM) 50 MG tablet Take 50 mg by mouth every 6 (six) hours as needed (pain).    [name] azithromycin (ZITHROMAX) 250 MG tablet Take 2 tabs day 1, then 1 tab daily (Patient not taking: Reported on 09/11/2023) 6 each 0   No facility-administered medications prior to visit.   Past Medical History:  Diagnosis Date  . ABDOMINAL PAIN, CHRONIC 10/20/2009  . Acute cystitis 08/17/2009  . ALLERGIC RHINITIS 11/21/2007  . Allergy   . ASYMPTOMATIC POSTMENOPAUSAL STATUS 09/29/2008  . Bariatric surgery status 07/07/2010  . Bariatric surgery status 07/07/2010   Qualifier: Diagnosis of  By: Everardo All MD,  Cleophas Dunker   . Cancer (HCC)    uterine  . Cataract   . Coronary artery disease   . DEPRESSION 11/21/2007  . DIABETES MELLITUS, TYPE II 07/08/2007  . DYSPNEA 05/20/2009  . Edema 05/20/2009  . Eosinophilic esophagitis 02/13/2008  . FEVER UNSPECIFIED 10/20/2009  . FEVER, HX OF 03/09/2010  . GERD (gastroesophageal reflux disease)   . Headache(784.0) 02/06/2010  . Hepatomegaly 01/06/2008  . HYPERLIPIDEMIA 07/08/2007  . HYPERTENSION 07/08/2007  . Kidney disease    followed by nephrology, per patient  . LEUKOPENIA, MILD 09/29/2008  . Lymphedema    left leg, dx approximately in 03/2023 per patient  . Melanoma in situ of left upper arm (HCC) 04/03/2023  . OBESITY 01/06/2008  . OSTEOARTHRITIS 01/06/2008  . Other chronic nonalcoholic liver disease 01/06/2008  . PERIPHERAL  NEUROPATHY 11/21/2007  . Peripheral neuropathy   . Postsurgical hypothyroidism 09/19/2010  . SINUSITIS- ACUTE-NOS 11/21/2007  . TB SKIN TEST, POSITIVE 02/06/2010  . THYROID NODULE 03/09/2010   Past Surgical History:  Procedure Laterality Date  . ABDOMINAL HYSTERECTOMY    . BASAL CELL CARCINOMA EXCISION    . CARDIAC CATHETERIZATION    . CHOLECYSTECTOMY N/A 05/11/2021   Procedure: LAPAROSCOPIC CHOLECYSTECTOMY WITH POSSIBLE  INTRAOPERATIVE CHOLANGIOGRAM;  Surgeon: Luretha Murphy, MD;  Location: [facility] ORS;  Service: General;  Laterality: N/A;  . COLONOSCOPY    . CORONARY ATHERECTOMY N/A 05/03/2022   Procedure: CORONARY ATHERECTOMY;  Surgeon: Orbie Pyo, MD;  Location: [facility] INVASIVE CV LAB;  Service: Cardiovascular;  Laterality: N/A;  . CORONARY IMAGING/OCT N/A 05/03/2022   Procedure: INTRAVASCULAR IMAGING/OCT;  Surgeon: Orbie Pyo, MD;  Location: [facility] INVASIVE CV LAB;  Service: Cardiovascular;  Laterality: N/A;  . CORONARY PRESSURE/FFR STUDY N/A 05/03/2022   Procedure: INTRAVASCULAR PRESSURE WIRE/FFR STUDY;  Surgeon: Orbie Pyo, MD;  Location: [facility] INVASIVE CV LAB;  Service: Cardiovascular;  Laterality: N/A;  .  CORONARY STENT INTERVENTION N/A 05/03/2022   Procedure: CORONARY STENT INTERVENTION;  Surgeon: Orbie Pyo, MD;  Location: [facility] INVASIVE CV LAB;  Service: Cardiovascular;  Laterality: N/A;  lad  . EYE SURGERY Bilateral 08/2018, 09/2018  . FOOT SURGERY Left    10/2019  . KNEE ARTHROSCOPY Left   . LEFT HEART CATH AND CORONARY ANGIOGRAPHY N/A 01/20/2019   Procedure: LEFT HEART CATH AND CORONARY ANGIOGRAPHY;  Surgeon: Lyn Records, MD;  Location: [facility] INVASIVE CV LAB;  Service: Cardiovascular;  Laterality: N/A;  . LYMPH NODE DISSECTION    . OOPHORECTOMY    . Right total knee replacement    . RIGHT/LEFT HEART CATH AND CORONARY ANGIOGRAPHY N/A 05/03/2022   Procedure: RIGHT/LEFT HEART CATH AND CORONARY ANGIOGRAPHY;  Surgeon: Orbie Pyo, MD;  Location: [facility] INVASIVE CV LAB;  Service: Cardiovascular;  Laterality: N/A;  . ROUX-EN-Y GASTRIC BYPASS  2011  . SKIN TAG REMOVAL    . THYROIDECTOMY    . TONSILLECTOMY AND ADENOIDECTOMY    . TOOTH EXTRACTION  2023  . UPPER GASTROINTESTINAL ENDOSCOPY    . UPPER GI ENDOSCOPY  2024   Allergies  Allergen Reactions  . Ace Inhibitors Cough  . Caffeine     PVCs  . Ciprofloxacin Itching  . Morphine And Codeine Itching    Pt tolerates medication if given with diphenhydramine  . Mucinex [Guaifenesin Er]     PVCs  . Nsaids     Gastric Bypass Surgery - unable to take, tolerates ec 81 aspirin   . Other     Antihistamine-alkylamine - PVCs tolerates benadryl   . Silicone Hives  . Tape Hives      Objective:    Physical Exam Vitals and nursing note reviewed.  Constitutional:      Appearance: Normal appearance.  HENT:     Mouth/Throat:     Mouth: Mucous membranes are moist.     Palate: Lesions (left side soft palate with erythema, hard palate with light yellow coating) present.  Cardiovascular:     Rate and Rhythm: Normal rate and regular rhythm.  Pulmonary:     Effort: Pulmonary effort is normal.     Breath sounds: Normal breath sounds.   Musculoskeletal:        General: Normal range of motion.  Skin:    General: Skin is warm and dry.  Neurological:     Mental Status: She is alert.  Psychiatric:        Mood and Affect: Mood normal.        Behavior: Behavior normal.   BP 117/76 (BP Location: Left Arm, Patient Position: Sitting, Cuff Size: Normal)   Pulse 68   Temp 97.8 F (36.6 C) (Temporal)   Ht 5\' 11"  (1.803 m)   Wt 178 lb (80.7 kg)   SpO2 98%   BMI 24.83 kg/m  Wt Readings from Last 3 Encounters:  09/11/23 178 lb (80.7 kg)  08/20/23 170 lb (77.1 kg)  06/05/23 180 lb 12.8 oz (82 kg)       Dulce Sellar, NP

## 2023-09-11 NOTE — Telephone Encounter (Signed)
Pharmacy Patient Advocate Encounter  Received notification from [facility] that Prior Authorization for Invokana 300mg  tabs has been APPROVED from 09/09/23 to 11/11/24   PA #/Case ID/Reference #:   [id number]  Left a message at [facility] to notify of the approval.

## 2023-09-16 ENCOUNTER — Telehealth: Payer: Self-pay | Admitting: Family Medicine

## 2023-09-16 ENCOUNTER — Encounter: Payer: Self-pay | Admitting: Internal Medicine

## 2023-09-16 ENCOUNTER — Encounter: Payer: Self-pay | Admitting: Family

## 2023-09-16 DIAGNOSIS — B379 Candidiasis, unspecified: Secondary | ICD-10-CM

## 2023-09-16 DIAGNOSIS — H04123 Dry eye syndrome of bilateral lacrimal glands: Secondary | ICD-10-CM | POA: Diagnosis not present

## 2023-09-16 MED ORDER — FLUCONAZOLE 150 MG PO TABS
150.0000 mg | ORAL_TABLET | ORAL | 0 refills | Status: DC
Start: 2023-09-16 — End: 2023-10-30

## 2023-09-16 MED ORDER — CLOPIDOGREL BISULFATE 75 MG PO TABS: 75.0000 mg | ORAL_TABLET | Freq: Every day | ORAL | 3 refills | Status: DC

## 2023-09-16 NOTE — Telephone Encounter (Signed)
Patient called in stating she has been using the magic mouthwash since 10/30 OV but is still experiencing symptoms such as itching. Patient is requesting an antifungal or some other medication be sent in. Please Advise.

## 2023-09-17 DIAGNOSIS — N1832 Chronic kidney disease, stage 3b: Secondary | ICD-10-CM | POA: Diagnosis not present

## 2023-09-17 NOTE — Telephone Encounter (Signed)
LVM informing pt to call back and get scheduled with PCP for ongoing symptoms.

## 2023-09-22 DIAGNOSIS — N3001 Acute cystitis with hematuria: Secondary | ICD-10-CM | POA: Diagnosis not present

## 2023-09-26 ENCOUNTER — Ambulatory Visit (INDEPENDENT_AMBULATORY_CARE_PROVIDER_SITE_OTHER): Payer: Medicare Other | Admitting: Nurse Practitioner

## 2023-09-26 ENCOUNTER — Telehealth: Payer: Self-pay | Admitting: Nurse Practitioner

## 2023-09-26 ENCOUNTER — Encounter: Payer: Self-pay | Admitting: Nurse Practitioner

## 2023-09-26 VITALS — BP 128/60 | HR 74 | Temp 98.0°F | Resp 18 | Wt 181.4 lb

## 2023-09-26 DIAGNOSIS — B370 Candidal stomatitis: Secondary | ICD-10-CM | POA: Diagnosis not present

## 2023-09-26 DIAGNOSIS — N3000 Acute cystitis without hematuria: Secondary | ICD-10-CM | POA: Diagnosis not present

## 2023-09-26 DIAGNOSIS — R42 Dizziness and giddiness: Secondary | ICD-10-CM

## 2023-09-26 MED ORDER — CLOTRIMAZOLE 10 MG MT TROC
10.0000 mg | Freq: Every day | OROMUCOSAL | 0 refills | Status: DC
Start: 2023-09-26 — End: 2024-07-01

## 2023-09-26 MED ORDER — PHENAZOPYRIDINE HCL 100 MG PO TABS
100.0000 mg | ORAL_TABLET | Freq: Three times a day (TID) | ORAL | 0 refills | Status: AC
Start: 2023-09-26 — End: ?

## 2023-09-26 NOTE — Patient Instructions (Signed)
Maintain current oral antibiotics Urine sent for culture Start vertigo exercise Maintain adequate oral hydration  Benign Positional Vertigo Vertigo is the feeling that you or your surroundings are moving when they are not. Benign positional vertigo is the most common form of vertigo. This is usually a harmless condition (benign). This condition is positional. This means that symptoms are triggered by certain movements and positions. This condition can be dangerous if it occurs while you are doing something that could cause harm to yourself or others. This includes activities such as driving or operating machinery. What are the causes? The inner ear has fluid-filled canals that help your brain sense movement and balance. When the fluid moves, the brain receives messages about your body's position. With benign positional vertigo, calcium crystals in the inner ear break free and disturb the inner ear area. This causes your brain to receive confusing messages about your body's position. What increases the risk? You are more likely to develop this condition if: You are a woman. You are 69 years of age or older. You have recently had a head injury. You have an inner ear disease. What are the signs or symptoms? Symptoms of this condition usually happen when you move your head or your eyes in different directions. Symptoms may start suddenly and usually last for less than a minute. They include: Loss of balance and falling. Feeling like you are spinning or moving. Feeling like your surroundings are spinning or moving. Nausea and vomiting. Blurred vision. Dizziness. Involuntary eye movement (nystagmus). Symptoms can be mild and cause only minor problems, or they can be severe and interfere with daily life. Episodes of benign positional vertigo may return (recur) over time. Symptoms may also improve over time. How is this diagnosed? This condition may be diagnosed based on: Your medical  history. A physical exam of the head, neck, and ears. Positional tests to check for or stimulate vertigo. You may be asked to turn your head and change positions, such as going from sitting to lying down. A health care provider will watch for symptoms of vertigo. You may be referred to a health care provider who specializes in ear, nose, and throat problems (ENT or otolaryngologist) or a provider who specializes in disorders of the nervous system (neurologist). How is this treated?  This condition may be treated in a session in which your health care provider moves your head in specific positions to help the displaced crystals in your inner ear move. Treatment for this condition may take several sessions. Surgery may be needed in severe cases, but this is rare. In some cases, benign positional vertigo may resolve on its own in 2-4 weeks. Follow these instructions at home: Safety Move slowly. Avoid sudden body or head movements or certain positions, as told by your health care provider. Avoid driving or operating machinery until your health care provider says it is safe. Avoid doing any tasks that would be dangerous to you or others if vertigo occurs. If you have trouble walking or keeping your balance, try using a cane for stability. If you feel dizzy or unstable, sit down right away. Return to your normal activities as told by your health care provider. Ask your health care provider what activities are safe for you. General instructions Take over-the-counter and prescription medicines only as told by your health care provider. Drink enough fluid to keep your urine pale yellow. Keep all follow-up visits. This is important. Contact a health care provider if: You have a fever. Your  condition gets worse or you develop new symptoms. Your family or friends notice any behavioral changes. You have nausea or vomiting that gets worse. You have numbness or a prickling and tingling sensation. Get help  right away if you: Have difficulty speaking or moving. Are always dizzy or faint. Develop severe headaches. Have weakness in your legs or arms. Have changes in your hearing or vision. Develop a stiff neck. Develop sensitivity to light. These symptoms may represent a serious problem that is an emergency. Do not wait to see if the symptoms will go away. Get medical help right away. Call your local emergency services (911 in the [place] Do not drive yourself to the hospital. Summary Vertigo is the feeling that you or your surroundings are moving when they are not. Benign positional vertigo is the most common form of vertigo. This condition is caused by calcium crystals in the inner ear that become displaced. This causes a disturbance in an area of the inner ear that helps your brain sense movement and balance. Symptoms include loss of balance and falling, feeling that you or your surroundings are moving, nausea and vomiting, and blurred vision. This condition can be diagnosed based on symptoms, a physical exam, and positional tests. Follow safety instructions as told by your health care provider and keep all follow-up visits. This is important. This information is not intended to replace advice given to you by your health care provider. Make sure you discuss any questions you have with your health care provider. Document Revised: 09/28/2020 Document Reviewed: 09/28/2020 Elsevier Patient Education  2024 [employer].

## 2023-09-26 NOTE — Telephone Encounter (Signed)
LVM to CB Inform her that I also sent clotrimazole troche to help with oral thrush.

## 2023-09-26 NOTE — Progress Notes (Signed)
Acute Office Visit  Subjective:    Patient ID: Savannah [name], female    DOB: 1956/06/13, 67 y.o.   MRN: [id number]  Chief Complaint  Patient presents with   Acute Visit    PT is here for UTI symptoms since Saturday. PT went to Urgent Care in [place]. Frequent urination, mild burning, with discomfort in pelvis area. PT C/O of upper mouth discomfort for a few days.    HPI 1)Patient is in today for UTI symptoms, oral thrush and dizziness. She traveled to [place] and developed urinary symptoms. She was evaluated at a local urgent care clinic. UA completed and macrobib 100mg  BID x 7days was prescribed. Today she reports improving symptoms but persistent dysuria. No fever, no back pain, no blood in urine, no constipation or diarrhea, no vaginal symptoms. She had Urine culture completed 03/2023: positive E. Coli and resistance to cephalosporin and PCN. Calculated creatinine clearance: 38.43ml/min per CMP 04/03/2023 She declined use of bactrim due to risk of hyperkalemia and decreased renal function. 2)She also reports lightlessness, onset yesterday. Describes symptoms as mild. Denies any nausea or hearing loss or confusion or paresthesia or weakness or head injury. She returned from [place] 2days ago via plane. She has hx of vertigo. She declined need for meclizine and dramamine 3)Recent use of azithromycin and augmentin for upper respiratory infection. She Developed oral thrush post oral abx. Treated with oral diflucan and nystatin with some improvement. Current use of Invokanna. States she maintain 6-8 16oz bottles of water daily. Last hgbA1c at 7.1%  Outpatient Medications Prior to Visit  Medication Sig   acetaminophen (TYLENOL) 500 MG tablet Take 2 tablets (1,000 mg total) by mouth every 6 (six) hours as needed for mild pain or fever.   atorvastatin (LIPITOR) 40 MG tablet Take 1 tablet (40 mg total) by mouth daily.   benzonatate (TESSALON) 200 MG capsule Take 1 capsule (200 mg total) by mouth  2 (two) times daily as needed for cough.   blood glucose meter kit and supplies KIT Use up to three  times daily as directed.   Blood Glucose Monitoring Suppl DEVI 1 each by Does not apply route in the morning, at noon, and at bedtime. May substitute to any manufacturer covered by patient's insurance.   canagliflozin (INVOKANA) 300 MG TABS tablet Take 1 tablet (300 mg total) by mouth daily before breakfast.   Cholecalciferol (VITAMIN D) 50 MCG (2000 UT) tablet Take 4,000 Units by mouth in the morning.   clopidogrel (PLAVIX) 75 MG tablet Take 1 tablet (75 mg total) by mouth daily.   Colchicine (MITIGARE) 0.6 MG CAPS Take by mouth as needed.   cyclobenzaprine (FLEXERIL) 10 MG tablet Take 10 mg by mouth at bedtime as needed for muscle spasms.   diclofenac sodium (VOLTAREN) 1 % GEL Apply 4 g topically 4 (four) times daily.   Dulaglutide (TRULICITY) 4.5 MG/0.5ML SOPN Inject 4.5 mg as directed once a week.   fluconazole (DIFLUCAN) 150 MG tablet Take 1 tablet (150 mg total) by mouth See admin instructions. Take 150 mg every other day for 8 days.   Halobetasol & Lactic Acid (ULTRAVATE X, OINTMENT, EX) Apply 1 Application topically 2 (two) times daily.   HYDROcodone-acetaminophen (NORCO/VICODIN) 5-325 MG tablet Take 1 tablet by mouth as needed for moderate pain.   levalbuterol (XOPENEX HFA) 45 MCG/ACT inhaler Inhale 1 puff into the lungs every 4 (four) hours as needed for wheezing.   levocetirizine (XYZAL) 5 MG tablet Take 1 tablet (5 mg total)  by mouth every evening.   levothyroxine (SYNTHROID) 100 MCG tablet Take 1 tablet (100 mcg total) by mouth daily.   lidocaine (LIDODERM) 5 % PLACE 1 PATCH ONTO THE SKIN DAILY. REMOVE & DISCARD PATCH WITHIN 12 HOURS OR AS DIRECTED BY MD   MAGNESIUM PO Take 3 tablets by mouth daily as needed (foot cramps).   methocarbamol (ROBAXIN) 750 MG tablet Take 750 mg by mouth every 6 (six) hours as needed.   Multiple Vitamin (MULTIVITAMIN) tablet Take 1 tablet by mouth in the  morning and at bedtime.   nitrofurantoin, macrocrystal-monohydrate, (MACROBID) 100 MG capsule Take by mouth.   nitroGLYCERIN (NITROSTAT) 0.4 MG SL tablet Place 1 tablet (0.4 mg total) under the tongue every 5 (five) minutes as needed.   ONETOUCH ULTRA test strip USE AS INSTRUCTED   pantoprazole (PROTONIX) 40 MG tablet TAKE 1 TABLET BY MOUTH TWICE A DAY   repaglinide (PRANDIN) 2 MG tablet Take 1 tablet (2 mg total) by mouth 3 (three) times daily before meals.   traMADol (ULTRAM) 50 MG tablet Take 50 mg by mouth every 6 (six) hours as needed (pain).   [DISCONTINUED] amoxicillin-clavulanate (AUGMENTIN) 875-125 MG tablet Take 1 tablet by mouth 2 (two) times daily.   [DISCONTINUED] azithromycin (ZITHROMAX) 250 MG tablet Take 2 tabs day 1, then 1 tab daily   No facility-administered medications prior to visit.    Reviewed past medical and social history.  Review of Systems Per HPI     Objective:    Physical Exam Vitals reviewed.  HENT:     Right Ear: Tympanic membrane, ear canal and external ear normal.     Left Ear: Tympanic membrane, ear canal and external ear normal.     Mouth/Throat:     Mouth: Mucous membranes are dry.     Comments: Mild erythematous patches on soft palate Eyes:     Extraocular Movements: Extraocular movements intact.     Conjunctiva/sclera: Conjunctivae normal.     Pupils: Pupils are equal, round, and reactive to light.  Cardiovascular:     Rate and Rhythm: Normal rate.     Pulses: Normal pulses.  Pulmonary:     Effort: Pulmonary effort is normal.  Abdominal:     General: There is no distension.     Palpations: Abdomen is soft.     Tenderness: There is no abdominal tenderness. There is no right CVA tenderness or left CVA tenderness.  Musculoskeletal:     Right lower leg: Edema present.     Left lower leg: Edema present.  Neurological:     Mental Status: She is alert and oriented to person, place, and time.     Cranial Nerves: No cranial nerve deficit.      Motor: No weakness.  Psychiatric:        Mood and Affect: Mood normal.        Behavior: Behavior normal.        Thought Content: Thought content normal.    BP 128/60 (BP Location: Left Arm, Patient Position: Sitting, Cuff Size: Normal)   Pulse 74   Temp 98 F (36.7 C) (Temporal)   Resp 18   Wt 181 lb 6.4 oz (82.3 kg)   SpO2 100%   BMI 25.30 kg/m  BP Readings from Last 3 Encounters:  09/26/23 128/60  09/11/23 117/76  08/22/23 114/77   Wt Readings from Last 3 Encounters:  09/26/23 181 lb 6.4 oz (82.3 kg)  09/11/23 178 lb (80.7 kg)  08/20/23 170 lb (77.1 kg)  No results found for any visits on 09/26/23.     Assessment & Plan:   Problem List Items Addressed This Visit   None Visit Diagnoses     Acute cystitis without hematuria    -  Primary   Relevant Medications   phenazopyridine (PYRIDIUM) 100 MG tablet   Other Relevant Orders   Urine Culture   Vertigo       Thrush, oral       Relevant Medications   nitrofurantoin, macrocrystal-monohydrate, (MACROBID) 100 MG capsule   clotrimazole (MYCELEX) 10 MG troche      Meds ordered this encounter  Medications   phenazopyridine (PYRIDIUM) 100 MG tablet    Sig: Take 1 tablet (100 mg total) by mouth 3 (three) times daily with meals.    Dispense:  9 tablet    Refill:  0    Order Specific Question:   Supervising Provider    Answer:   Nadene Rubins ALFRED [[id number]]   clotrimazole (MYCELEX) 10 MG troche    Sig: Take 1 tablet (10 mg total) by mouth 5 (five) times daily.    Dispense:  35 Troche    Refill:  0    Order Specific Question:   Supervising Provider    Answer:   Mliss Sax [[id number]]   Advised to f/up with endocrinology or pcp about DIABETES management (consider switching invokanna since recurrent UTI and thrush) Maintain macrobid rx at this time while waiting for urine culture. Advised to maintain adequate oral hydration.  Return if symptoms worsen or fail to improve.  Savannah Penna, NP

## 2023-09-27 DIAGNOSIS — D2230 Melanocytic nevi of unspecified part of face: Secondary | ICD-10-CM | POA: Diagnosis not present

## 2023-09-27 DIAGNOSIS — Z86018 Personal history of other benign neoplasm: Secondary | ICD-10-CM | POA: Diagnosis not present

## 2023-09-27 DIAGNOSIS — Z85828 Personal history of other malignant neoplasm of skin: Secondary | ICD-10-CM | POA: Diagnosis not present

## 2023-09-27 DIAGNOSIS — D2272 Melanocytic nevi of left lower limb, including hip: Secondary | ICD-10-CM | POA: Diagnosis not present

## 2023-09-27 DIAGNOSIS — L309 Dermatitis, unspecified: Secondary | ICD-10-CM | POA: Diagnosis not present

## 2023-09-27 DIAGNOSIS — L821 Other seborrheic keratosis: Secondary | ICD-10-CM | POA: Diagnosis not present

## 2023-09-27 DIAGNOSIS — D2271 Melanocytic nevi of right lower limb, including hip: Secondary | ICD-10-CM | POA: Diagnosis not present

## 2023-09-27 DIAGNOSIS — D225 Melanocytic nevi of trunk: Secondary | ICD-10-CM | POA: Diagnosis not present

## 2023-09-27 DIAGNOSIS — R609 Edema, unspecified: Secondary | ICD-10-CM | POA: Diagnosis not present

## 2023-09-27 DIAGNOSIS — L900 Lichen sclerosus et atrophicus: Secondary | ICD-10-CM | POA: Diagnosis not present

## 2023-09-27 DIAGNOSIS — L570 Actinic keratosis: Secondary | ICD-10-CM | POA: Diagnosis not present

## 2023-09-27 DIAGNOSIS — L578 Other skin changes due to chronic exposure to nonionizing radiation: Secondary | ICD-10-CM | POA: Diagnosis not present

## 2023-09-28 LAB — URINE CULTURE
MICRO NUMBER:: 15731318
SPECIMEN QUALITY:: ADEQUATE

## 2023-09-28 MED ORDER — AMOXICILLIN-POT CLAVULANATE 875-125 MG PO TABS: 1.0000 | ORAL_TABLET | Freq: Two times a day (BID) | ORAL | 0 refills | Status: DC

## 2023-09-28 NOTE — Addendum Note (Signed)
Addended by: Michaela Corner on: 09/28/2023 06:02 PM   Modules accepted: Orders

## 2023-09-30 ENCOUNTER — Ambulatory Visit (INDEPENDENT_AMBULATORY_CARE_PROVIDER_SITE_OTHER): Payer: Medicare Other | Admitting: Otolaryngology

## 2023-09-30 ENCOUNTER — Encounter (INDEPENDENT_AMBULATORY_CARE_PROVIDER_SITE_OTHER): Payer: Self-pay | Admitting: Otolaryngology

## 2023-09-30 VITALS — BP 120/74 | HR 72 | Ht 71.0 in

## 2023-09-30 DIAGNOSIS — K219 Gastro-esophageal reflux disease without esophagitis: Secondary | ICD-10-CM

## 2023-09-30 DIAGNOSIS — J3089 Other allergic rhinitis: Secondary | ICD-10-CM

## 2023-09-30 DIAGNOSIS — H8110 Benign paroxysmal vertigo, unspecified ear: Secondary | ICD-10-CM

## 2023-09-30 DIAGNOSIS — R490 Dysphonia: Secondary | ICD-10-CM | POA: Diagnosis not present

## 2023-09-30 DIAGNOSIS — Z9884 Bariatric surgery status: Secondary | ICD-10-CM | POA: Diagnosis not present

## 2023-09-30 DIAGNOSIS — R0982 Postnasal drip: Secondary | ICD-10-CM | POA: Diagnosis not present

## 2023-09-30 DIAGNOSIS — R0981 Nasal congestion: Secondary | ICD-10-CM

## 2023-09-30 DIAGNOSIS — R1310 Dysphagia, unspecified: Secondary | ICD-10-CM

## 2023-09-30 MED ORDER — FLUTICASONE PROPIONATE 50 MCG/ACT NA SUSP: 2.0000 | Freq: Every day | NASAL | 6 refills | Status: DC

## 2023-09-30 MED ORDER — CETIRIZINE HCL 10 MG PO TABS: 10.0000 mg | ORAL_TABLET | Freq: Every day | ORAL | 11 refills | Status: DC

## 2023-09-30 NOTE — Patient Instructions (Addendum)
-   see your GI doctor to discuss workup for esophageal dysphagia such as esophageal manometry - Take Reflux Gourmet (natural supplement available on [employer]) to help with symptoms of chronic throat irritation  - start Zyrtec and Flonase for post-nasal drainage - 3 mo follow-up

## 2023-09-30 NOTE — Progress Notes (Signed)
ENT CONSULT:  Reason for Consult: chronic dysphagia    HPI: Discussed the use of AI scribe software for clinical note transcription with the patient, who gave verbal consent to proceed.  History of Present Illness   The patient is a 67 yoF, with a history of eosinophilic esophagus and bariatric surgery, presents with a recurrence of swallowing difficulties, particularly with pills, and voice changes. The swallowing difficulties have been a lifelong issue, previously managed with esophageal dilations and a steroid inhaler for eosinophilic esophagus (when she was young) with sx relief. She reports pill dysphagia symptoms for over a year, and these sx have worsened over the past year. The patient describes a sensation of pills getting stuck in the throat, often requiring coughing to dislodge them. The patient also reports a hoarse, scratchy voice, which worsens as the day progresses.  In addition to these primary complaints, the patient has been experiencing postnasal drainage and occasional vertigo. The vertigo is described as brief episodes of dizziness, often triggered by changes in position. The patient has been managing these symptoms with occasional use of a nasal spray and exercises provided by a physical therapist. Would like to defer vestibular rehab and Epley maneuver at this time.   The patient has been on long-term Protonix for reflux management since the early 2000s and takes Xyzal for allergies. The patient also reports a history of gout affecting various joints, including the neck. The patient is currently on Augmentin for a urinary tract infection that was unresponsive to a previous antibiotic.  The patient underwent an upper endoscopy (EGD) approximately six months ago, which did not reveal any findings c/w EoE (not sure if bx was done at the time) but did identify a hiatal hernia. The patient also has a history of gallbladder surgery and bariatric surgery (Roux-en-Y), the latter of which  was performed approximately 14 years ago. The patient reports a normal diet now, albeit with smaller quantities than before the surgery.  The patient's symptoms have a significant impact on daily life, including difficulties with medication intake due to swallowing issues and voice changes affecting communication. The patient's quality of life is further affected by the ongoing postnasal drainage.     Hx of uterine cancer   Records Reviewed:  GI office note 03/07/2023 Mrs. Henner is a 67 year old female with a past medical history as listed below status post Roux-en-Y gastric bypass, known to Dr. Adela Lank, who was referred to me by Ardith Dark, MD for a complaint of dysphagia, hoarseness and coughing.    08/18/2019 office visit with Dr. Adela Lank for constipation.  At that point thought Citrucel provided some benefit.  Thought she had some element of pelvic floor dysfunction and recommended MiraLAX.  Discussed possible referral to PT for biofeedback.    Today, patient presents to clinic and tells me that many years ago she was diagnosed with eosinophilic esophagitis and placed on what sounds like swallowed Fluticasone which helped resolve her symptoms.  She was then seen by [place] after that point and had an EGD as below with no further signs of EOE.  She also describes history of a "ring" in her throat.  Tells me most recently over the past few months she has been noticing an increase in hoarseness which is there throughout the day and worse if she talks a lot.  Also feels like there is a sensitivity when she presses on the front side of her neck maybe rated as a 1-2/10 as well as pills starting to  get stuck and sometimes if she swallows wrong and hits the back of her throat she will start a coughing fit that often ends in vomiting.  She continues on Pantoprazole 40 mg twice daily.  Tells me she was a smoker for years prior to quitting and is worried that something else is going on now.    Denies fever,  chills, abdominal pain, weight loss or symptoms that awaken her from sleep.   Previous GI workup: EGD 01/2017: - Normal esophagus, no evidence of EoE - biopsies taken to rule out EoE. - Roux-en-Y gastrojejunostomy with gastrojejunal anastomosis characterized by healthy appearing mucosa. - A few benign appearing gastric polyps. Resected and retrieved. - Larger than expected gastric pouch. - Normal examined small bowel limb. Biopsies negative for H pylori and normal esophageal biopsies   Colonoscopy 01/2017 - one 5 mm polyp that was removed - adenoma, looping of the colon, and internal hemorrhoids.-Repeat due March 2025     CT scan 07/09/19 - IMPRESSION: 1. Mild wall thickening and mucosal enhancement involving the antrum of the excluded stomach, suspicious for gastritis. 2. No evidence of recurrent or metastatic carcinoma within the abdomen or pelvis. 3. Large stool burden throughout colon; recommend clinical correlation for possible constipation. 4. Cholelithiasis or gallbladder sludge. No radiographic evidence of cholecystitis.   RUQ [place] 05/27/19 - normal    Past Medical History:  Diagnosis Date   ABDOMINAL PAIN, CHRONIC 10/20/2009   Acute cystitis 08/17/2009   ALLERGIC RHINITIS 11/21/2007   Allergy    ASYMPTOMATIC POSTMENOPAUSAL STATUS 09/29/2008   Bariatric surgery status 07/07/2010   Bariatric surgery status 07/07/2010   Qualifier: Diagnosis of  By: Everardo All MD, Sean A    Cancer [facility])    uterine   Cataract    Coronary artery disease    DEPRESSION 11/21/2007   DIABETES MELLITUS, TYPE II 07/08/2007   DYSPNEA 05/20/2009   Edema 05/20/2009   Eosinophilic esophagitis 02/13/2008   FEVER UNSPECIFIED 10/20/2009   FEVER, HX OF 03/09/2010   GERD (gastroesophageal reflux disease)    Headache(784.0) 02/06/2010   Hepatomegaly 01/06/2008   HYPERLIPIDEMIA 07/08/2007   HYPERTENSION 07/08/2007   Kidney disease    followed by nephrology, per patient   LEUKOPENIA, MILD 09/29/2008    Lymphedema    left leg, dx approximately in 03/2023 per patient   Melanoma in situ of left upper arm (HCC) 04/03/2023   OBESITY 01/06/2008   OSTEOARTHRITIS 01/06/2008   Other chronic nonalcoholic liver disease 01/06/2008   PERIPHERAL NEUROPATHY 11/21/2007   Peripheral neuropathy    Postsurgical hypothyroidism 09/19/2010   SINUSITIS- ACUTE-NOS 11/21/2007   TB SKIN TEST, POSITIVE 02/06/2010   THYROID NODULE 03/09/2010    Past Surgical History:  Procedure Laterality Date   ABDOMINAL HYSTERECTOMY     BASAL CELL CARCINOMA EXCISION     CARDIAC CATHETERIZATION     CHOLECYSTECTOMY N/A 05/11/2021   Procedure: LAPAROSCOPIC CHOLECYSTECTOMY WITH POSSIBLE  INTRAOPERATIVE CHOLANGIOGRAM;  Surgeon: Luretha Murphy, MD;  Location: [facility];  Service: General;  Laterality: N/A;   COLONOSCOPY     CORONARY ATHERECTOMY N/A 05/03/2022   Procedure: CORONARY ATHERECTOMY;  Surgeon: Orbie Pyo, MD;  Location: [facility];  Service: Cardiovascular;  Laterality: N/A;   CORONARY IMAGING/OCT N/A 05/03/2022   Procedure: INTRAVASCULAR IMAGING/OCT;  Surgeon: Orbie Pyo, MD;  Location: [facility];  Service: Cardiovascular;  Laterality: N/A;   CORONARY PRESSURE/FFR STUDY N/A 05/03/2022   Procedure: INTRAVASCULAR PRESSURE WIRE/FFR STUDY;  Surgeon: Orbie Pyo, MD;  Location: [facility];  Service: Cardiovascular;  Laterality: N/A;   CORONARY STENT INTERVENTION N/A 05/03/2022   Procedure: CORONARY STENT INTERVENTION;  Surgeon: Orbie Pyo, MD;  Location: [facility];  Service: Cardiovascular;  Laterality: N/A;  lad   EYE SURGERY Bilateral 08/2018, 09/2018   FOOT SURGERY Left    10/2019   KNEE ARTHROSCOPY Left    LEFT HEART CATH AND CORONARY ANGIOGRAPHY N/A 01/20/2019   Procedure: LEFT HEART CATH AND CORONARY ANGIOGRAPHY;  Surgeon: Lyn Records, MD;  Location: [facility];  Service: Cardiovascular;  Laterality: N/A;   LYMPH NODE DISSECTION     OOPHORECTOMY      Right total knee replacement     RIGHT/LEFT HEART CATH AND CORONARY ANGIOGRAPHY N/A 05/03/2022   Procedure: RIGHT/LEFT HEART CATH AND CORONARY ANGIOGRAPHY;  Surgeon: Orbie Pyo, MD;  Location: [facility];  Service: Cardiovascular;  Laterality: N/A;   ROUX-EN-Y GASTRIC BYPASS  2011   SKIN TAG REMOVAL     THYROIDECTOMY     TONSILLECTOMY AND ADENOIDECTOMY     TOOTH EXTRACTION  2023   UPPER GASTROINTESTINAL ENDOSCOPY     UPPER GI ENDOSCOPY  2024    Family History  Problem Relation Age of Onset   Diabetes Mother    Neuropathy Mother    Diabetes Father    Congestive Heart Failure Father    Hypertension Father    Myelodysplastic syndrome Father    Bladder Cancer Sister    Multiple sclerosis Sister    Breast cancer Sister        breast onset age 31   Osteoporosis Sister    Liver cancer Maternal Aunt    Breast cancer Maternal Aunt    Diabetes Maternal Aunt    Colon cancer Maternal Uncle    Diabetes Maternal Uncle    Neuropathy Paternal Aunt    Diabetes Maternal Grandmother    Diabetes Paternal Grandmother     Social History:  reports that she quit smoking about 32 years ago. Her smoking use included cigarettes. She started smoking about 47 years ago. She has a 22.5 pack-year smoking history. She has never been exposed to tobacco smoke. She has never used smokeless tobacco. She reports current alcohol use. She reports that she does not use drugs.  Allergies:  Allergies  Allergen Reactions   Ace Inhibitors Cough   Caffeine     PVCs   Ciprofloxacin Itching   Morphine And Codeine Itching    Pt tolerates medication if given with diphenhydramine   Mucinex [Guaifenesin Er]     PVCs   Nsaids     Gastric Bypass Surgery - unable to take, tolerates ec 81 aspirin    Other     Antihistamine-alkylamine - PVCs tolerates benadryl    Silicone Hives   Tape Hives    Medications: I have reviewed the patient's current medications.  The PMH, PSH, Medications, Allergies, and  SH were reviewed and updated.  ROS: Constitutional: Negative for fever, weight loss and weight gain. Cardiovascular: Negative for chest pain and dyspnea on exertion. Respiratory: Is not experiencing shortness of breath at rest. Gastrointestinal: Negative for nausea and vomiting. Neurological: Negative for headaches. Psychiatric: The patient is not nervous/anxious  Blood pressure 120/74, pulse 72, height 5\' 11"  (1.803 m), SpO2 94%.  PHYSICAL EXAM:  Exam: General: Well-developed, well-nourished Communication and Voice: mildly raspy Respiratory Respiratory effort: Equal inspiration and expiration without stridor Cardiovascular Peripheral Vascular: Warm extremities with equal color/perfusion Eyes: No  nystagmus with equal extraocular motion bilaterally Neuro/Psych/Balance: Patient oriented to person, place, and time; Appropriate mood and affect; Gait is intact with no imbalance; Cranial nerves I-XII are intact Head and Face Inspection: Normocephalic and atraumatic without mass or lesion Palpation: Facial skeleton intact without bony stepoffs Salivary Glands: No mass or tenderness Facial Strength: Facial motility symmetric and full bilaterally ENT Pinna: External ear intact and fully developed External canal: Canal is patent with intact skin Tympanic Membrane: Clear and mobile External Nose: No scar or anatomic deformity Internal Nose: Septum is deviated to the left. No polyp, or purulence. Mucosal edema and erythema present.  Bilateral inferior turbinate hypertrophy.  Lips, Teeth, and gums: Mucosa and teeth intact and viable TMJ: No pain to palpation with full mobility Oral cavity/oropharynx: No erythema or exudate, no lesions present Nasopharynx: No mass or lesion with intact mucosa Hypopharynx: Intact mucosa without pooling of secretions Larynx Glottic: Full true vocal cord mobility without lesion or mass Supraglottic: Normal appearing epiglottis and AE folds Interarytenoid  Space: Moderate pachydermia edema Subglottic Space: Patent without lesion or edema Neck Neck and Trachea: Midline trachea without mass or lesion Thyroid: No mass or nodularity Lymphatics: No lymphadenopathy  Procedure:  Preoperative diagnosis:  dysphagia and dysphonia  Postoperative diagnosis:   Same + GERD LPR  Procedure: Flexible fiberoptic laryngoscopy  Surgeon: Ashok Croon, MD  Anesthesia: Topical lidocaine and Afrin Complications: None Condition is stable throughout exam  Indications and consent:  The patient presents to the clinic with Indirect laryngoscopy view was incomplete. Thus it was recommended that they undergo a flexible fiberoptic laryngoscopy. All of the risks, benefits, and potential complications were reviewed with the patient preoperatively and verbal informed consent was obtained.  Procedure: The patient was seated upright in the clinic. Topical lidocaine and Afrin were applied to the nasal cavity. After adequate anesthesia had occurred, I then proceeded to pass the flexible telescope into the nasal cavity. The nasal cavity was patent without rhinorrhea or polyp. The nasopharynx was also patent without mass or lesion. The base of tongue was visualized and was normal. There were no signs of pooling of secretions in the piriform sinuses. The true vocal folds were mobile bilaterally. There were no signs of glottic or supraglottic mucosal lesion or mass. There was moderate interarytenoid pachydermia and post cricoid edema. The telescope was then slowly withdrawn and the patient tolerated the procedure throughout.  Studies Reviewed: MBS 09/02/23 HPI: Tonica Milkey is a 67 y.o. female with PMH: HTN, HLD, GERD with esophagitis, LPR (laryngopharyngeal reflux), CAD, EOE, obesity, arthritis, DM-2, TMJ, depression. She presented to this outpatient modified barium swallow study secondary to h/o dysphagia. Per 06/25/23 visit to ENT, patient at that time reported "difficulty  swallowing, particularly with solid foods and pills, which often get stuck in her throat, leading to choking" but denied any pain while swallowing, regurgitation of food or unexplained weight loss.     Clinical Impression: Clinical Impression: Ms. Quintal was seen by SLP for this moidified barium swallow study per complaints of globus sensation, trouble swallowing pills, and changes in her voice. she presents with an overall functional oropharyngeal swallow as evidenced by the results of this study. There were no observed instances of aspiration or significant phayrngeal residuals. Pentration (PAS 4) occurred in a few instances with thin liquid administration and one instance with nectar thick liquid (PAS 2). Reduced hyolaryngeal excusion was noted, however epiglottic inversion was complete and adequetly protected the airway. The phayrnesophageal segment opening was obsevred as partially distended. Barium  retrograde below the PES was present. Barium tablet was administered and noted to stall in the middle esopahgus when taken with thin liquid. Honey-thick liquid completed tablet's transit. 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 SLP recommended Ms. Sainsbury continues with a regular diet with thin liquids, taking medications with thicker liquids (e.g., smoothie) as needed. Acid reflux preactions (e.g., remain upright 30 minutes after eating) were reviewed with Ms. Heck and she verbalized understanding. No further ST is indicated at this time.    Assessment/Plan: Encounter Diagnoses  Name Primary?   Dysphagia, unspecified type Yes   Dysphonia    Chronic GERD    History of bariatric surgery    Environmental and seasonal allergies    Post-nasal drip    Chronic nasal congestion    Benign paroxysmal positional vertigo, unspecified laterality     Assessment and Plan    History of Eosinophilic Esophagitis (EOE) and long-standing dysphagia mostly with pills, hx of Rour-en-Y  several years ago, and hx of uterine cancer. Established with GI.  EOE diagnosed years ago, treated with swallowed steroid inhaler. Recent upper endoscopy six months ago showed no recurrence, but reports dysphagia with pills and intermittent hoarseness. Her EGD 2018 had no evidence of EoE based on record review. Reviewed elimination diet for EOE management. - esophagram to rule out esophageal causes of dysphagia  and evaluate size of hiatal hernia noted on recent EGD (MBS with normal oropharyngeal swallow, but pill got stuck at mid esophagus although there were no strictures noted, no images of esophagram available under MBS exam to review) - could benefit from esophageal manometry   Long-standing dysphonia with intermittently raspy voice, and voice fading end of the day Recent symptoms include hoarseness and postnasal drainage, could be reflux-related vs age-related VF atrophy - flexible scope today without masses or lesions but with findings of GERD/LPR - will plan for strobe in the future    Gastroesophageal Reflux Disease (GERD) Long-standing GERD managed with Protonix 40 mg BID. Recent symptoms include hoarseness and postnasal drainage, could be reflux-related. Scope exam showed reflux changes and postnasal drainage. Discussed Reflux Gourmet supplement (seaweed-based) to coat stomach contents and prevent reflux. - Continue Protonix 40 mg BID - Recommend Reflux Gourmet supplement after meals and before bedtime - Consider voice therapy if symptoms persist  Hiatal Hernia Diagnosed with hiatal hernia during recent upper endoscopy. No significant symptoms attributed to it. - Monitor for symptoms - Continue current GERD management - esophagram to evaluate the size of hernia  Postnasal Drip Reports postnasal drainage and intermittent vertigo. Nasal congestion noted on scope exam. Currently using Xyzal and intermittent nasal spray. Discussed switching to Zyrtec and starting Flonase BID for a few  months. - Switch to Zyrtec 10 mg daily  - Start Flonase BID b/l nares 2 puffs for a few months - Consider combining with current nasal spray if needed  Benign Paroxysmal Positional Vertigo (BPPV) Intermittent vertigo, especially with positional changes. Symptoms suggestive of BPPV. Previous physical therapy provided vertigo exercises. Discussed effectiveness of Epley maneuver. - Continue prescribed vertigo exercises - Consider referral to vestibular rehab if symptoms persist  Follow-up - Follow up with GI specialist for additional testing such as esophageal manometry - Reassess in a few months to evaluate effectiveness of new treatments and symptom management.     Thank you for allowing me to participate in the care of this patient. Please do not hesitate to contact me with any questions or concerns.   Ashok Croon, MD Otolaryngology [facility] Phone: [phone] Fax: [phone]    09/30/2023, 11:47 PM

## 2023-10-01 DIAGNOSIS — E039 Hypothyroidism, unspecified: Secondary | ICD-10-CM | POA: Diagnosis not present

## 2023-10-01 DIAGNOSIS — E1122 Type 2 diabetes mellitus with diabetic chronic kidney disease: Secondary | ICD-10-CM | POA: Diagnosis not present

## 2023-10-01 DIAGNOSIS — N1832 Chronic kidney disease, stage 3b: Secondary | ICD-10-CM | POA: Diagnosis not present

## 2023-10-01 DIAGNOSIS — Z9884 Bariatric surgery status: Secondary | ICD-10-CM | POA: Diagnosis not present

## 2023-10-09 ENCOUNTER — Ambulatory Visit: Payer: Medicare Other | Admitting: Physical Medicine and Rehabilitation

## 2023-10-09 ENCOUNTER — Encounter: Payer: Self-pay | Admitting: Physical Medicine and Rehabilitation

## 2023-10-09 DIAGNOSIS — M542 Cervicalgia: Secondary | ICD-10-CM

## 2023-10-09 DIAGNOSIS — M7918 Myalgia, other site: Secondary | ICD-10-CM | POA: Diagnosis not present

## 2023-10-09 NOTE — Progress Notes (Signed)
Savannah [name] - 67 y.o. female MRN [id number]  Date of birth: 1956-10-22  Office Visit Note: Visit Date: 10/09/2023 PCP: Ardith Dark, MD Referred by: Ardith Dark, MD  Subjective: Chief Complaint  Patient presents with   Neck - Pain   HPI: Savannah [name] is a 67 y.o. female who comes in today for evaluation of chronic, worsening and severe bilateral neck pain radiating up to head, right greater than left. Pain ongoing for several years, flared up over the last several days, worsens with movement and activity. She describes pain as sore and aching sensation, currently rates as 2 out of 10. Some relief of pain with home exercise regimen, rest and use of medications. Sitting seems to significantly alleviate her pain. History of formal physical therapy/chiropractic treatments in the past with some relief of pain. Also reports history of massage therapy many years ago that significantly helped to alleviate her pain. She is treated by Dr. Verdon Cummins with [facility], she is prescribed Gabapentin, also undergoes intermittent cervical myofascial trigger point injections with Dr. Manon Hilding. Last set of trigger point injections over 6 months ago. Cervical MRI imaging from 2022 exhibits mild degenerative changes at C4-C5 and C5-C6 without significant compression. No history of cervical epidural steroid injections. Patient denies focal weakness, numbness and tingling. No recent trauma or falls.      Review of Systems  Musculoskeletal:  Positive for myalgias and neck pain.  Neurological:  Negative for tingling, sensory change, focal weakness and weakness.  All other systems reviewed and are negative.  Otherwise per HPI.  Assessment & Plan: Visit Diagnoses:    ICD-10-CM   1. Cervicalgia  M54.2     2. Myofascial pain syndrome  M79.18        Plan: Findings:  Chronic, worsening and severe bilateral neck pain radiating up to head, right greater than left. No radicular symptoms down  the arms. Patient continues to have severe pain despite good conservative therapies such as formal physical therapy/chiropractic treatments, massage therapy, home exercise regimen, rest and use of medications. Patients clinical presentation and exam are consistent with myofascial pain syndrome. Multiple palpable trigger points noted to bilateral levator scapulae and trapezius regions. We discussed treatment plan in detail today. We recommend patient follow up with Dr. Manon Hilding for continued care and potential repeat cervical myofascial trigger point injections. I reassured patient that cervical MRI imaging does not show any type of nerve compression/high grade spinal canal stenosis. Her exam today is non focal, good strength noted to bilateral upper extremities. I also provided patient with recommendations for massage therapy at [facility] and chiropractic treatments with [facility]. Patient has no questions at this time. No red flag symptoms noted upon exam today.   Screening for Osteoporosis for Women Aged 34-5 Years of Age:    Patient has had a central dual-energy X-ray absorptiometry (DXA) to check for osteoporosis.   Today note sent to PCP on record. Patient does not need referral to [facility] osteoporosis clinic.        Meds & Orders: No orders of the defined types were placed in this encounter.  No orders of the defined types were placed in this encounter.   Follow-up: Return if symptoms worsen or fail to improve.   Procedures: No procedures performed      Clinical History: No specialty comments available.   She reports that she quit smoking about 32 years ago. Her smoking use included cigarettes. She started smoking about 47  years ago. She has a 22.5 pack-year smoking history. She has never been exposed to tobacco smoke. She has never used smokeless tobacco.  Recent Labs    10/15/22 1424 02/22/23 1513  HGBA1C 7.5* 7.1*    Objective:  VS:  HT:    WT:   BMI:      BP:   HR: bpm  TEMP: ( )  RESP:  Physical Exam Vitals and nursing note reviewed.  HENT:     Head: Normocephalic and atraumatic.     Right Ear: External ear normal.     Left Ear: External ear normal.     Nose: Nose normal.     Mouth/Throat:     Mouth: Mucous membranes are moist.  Eyes:     Extraocular Movements: Extraocular movements intact.  Cardiovascular:     Rate and Rhythm: Normal rate.     Pulses: Normal pulses.  Pulmonary:     Effort: Pulmonary effort is normal.  Abdominal:     General: Abdomen is flat. There is no distension.  Musculoskeletal:        General: Tenderness present.     Cervical back: Tenderness present.     Comments: No discomfort noted with flexion, extension and side-to-side rotation. Patient has good strength in the upper extremities including 5 out of 5 strength in wrist extension, long finger flexion and APB. Shoulder range of motion is full bilaterally without any sign of impingement. There is no atrophy of the hands intrinsically. Sensation intact bilaterally. Tenderness noted to bilateral levator scapulae and trapezius regions upon palpation. Negative Hoffman's sign. Negative Spurling's sign.     Skin:    General: Skin is warm and dry.     Capillary Refill: Capillary refill takes less than 2 seconds.  Neurological:     General: No focal deficit present.     Mental Status: She is alert and oriented to person, place, and time.  Psychiatric:        Mood and Affect: Mood normal.        Behavior: Behavior normal.     Ortho Exam  Imaging: No results found.  Past Medical/Family/Surgical/Social History: Medications & Allergies reviewed per EMR, new medications updated. Patient Active Problem List   Diagnosis Date Noted   Bilateral carpal tunnel syndrome 03/13/2022   Leg edema 03/01/2022   Nondisplaced comminuted fracture of left patella, initial encounter for closed fracture 08/01/2021   Arthritis of carpometacarpal [facility]) joint of right thumb  07/28/2021   Primary osteoarthritis of right distal radioulnar joint 07/28/2021   Arthritis of wrist, right, degenerative 07/28/2021   History of malignant neoplasm of uterine body 07/27/2021   Osteopenia 01/14/2021   Sesamoiditis of left foot 11/09/2020   Posterior tibial tendinitis of left lower extremity 11/09/2020   Type 2 diabetes mellitus with diabetic peripheral angiopathy without gangrene, with long-term current use of insulin (HCC) 10/21/2020   Iron deficiency anemia 03/14/2020   Lesion of vulva 09/02/2019   Menorrhagia 09/02/2019   Neuropathy 09/02/2019   Irregular bowel habits 06/25/2019   Coronary artery disease involving native coronary artery of native heart without angina pectoris    Temporomandibular joint disorder 12/05/2018   Fatigue 12/05/2018   Inverted nipple 12/05/2018   Senile purpura (HCC) 11/21/2018   GERD with esophagitis 11/21/2018   Pseudogout involving multiple joints 11/21/2018   Lipoma 07/22/2017   Lichen sclerosus 11/16/2016   H/O gastric bypass 11/16/2016   Depression 11/16/2016   Diabetic polyneuropathy associated with type 2 diabetes mellitus (HCC) 11/16/2016  Uterine cancer [facility]) s/p hysterectomy 1997 01/04/2012   Postsurgical hypothyroidism 09/19/2010   Diabetic neuropathy (HCC) 11/21/2007   Allergic rhinitis 11/21/2007   Dyslipidemia associated with type 2 diabetes mellitus (HCC) 07/08/2007   Past Medical History:  Diagnosis Date   ABDOMINAL PAIN, CHRONIC 10/20/2009   Acute cystitis 08/17/2009   ALLERGIC RHINITIS 11/21/2007   Allergy    ASYMPTOMATIC POSTMENOPAUSAL STATUS 09/29/2008   Bariatric surgery status 07/07/2010   Bariatric surgery status 07/07/2010   Qualifier: Diagnosis of  By: Everardo All MD, Sean A    Cancer [facility])    uterine   Cataract    Coronary artery disease    DEPRESSION 11/21/2007   DIABETES MELLITUS, TYPE II 07/08/2007   DYSPNEA 05/20/2009   Edema 05/20/2009   Eosinophilic esophagitis 02/13/2008   FEVER UNSPECIFIED  10/20/2009   FEVER, HX OF 03/09/2010   GERD (gastroesophageal reflux disease)    Headache(784.0) 02/06/2010   Hepatomegaly 01/06/2008   HYPERLIPIDEMIA 07/08/2007   HYPERTENSION 07/08/2007   Kidney disease    followed by nephrology, per patient   LEUKOPENIA, MILD 09/29/2008   Lymphedema    left leg, dx approximately in 03/2023 per patient   Melanoma in situ of left upper arm (HCC) 04/03/2023   OBESITY 01/06/2008   OSTEOARTHRITIS 01/06/2008   Other chronic nonalcoholic liver disease 01/06/2008   PERIPHERAL NEUROPATHY 11/21/2007   Peripheral neuropathy    Postsurgical hypothyroidism 09/19/2010   SINUSITIS- ACUTE-NOS 11/21/2007   TB SKIN TEST, POSITIVE 02/06/2010   THYROID NODULE 03/09/2010   Family History  Problem Relation Age of Onset   Diabetes Mother    Neuropathy Mother    Diabetes Father    Congestive Heart Failure Father    Hypertension Father    Myelodysplastic syndrome Father    Bladder Cancer Sister    Multiple sclerosis Sister    Breast cancer Sister        breast onset age 57   Osteoporosis Sister    Liver cancer Maternal Aunt    Breast cancer Maternal Aunt    Diabetes Maternal Aunt    Colon cancer Maternal Uncle    Diabetes Maternal Uncle    Neuropathy Paternal Aunt    Diabetes Maternal Grandmother    Diabetes Paternal Grandmother    Past Surgical History:  Procedure Laterality Date   ABDOMINAL HYSTERECTOMY     BASAL CELL CARCINOMA EXCISION     CARDIAC CATHETERIZATION     CHOLECYSTECTOMY N/A 05/11/2021   Procedure: LAPAROSCOPIC CHOLECYSTECTOMY WITH POSSIBLE  INTRAOPERATIVE CHOLANGIOGRAM;  Surgeon: Luretha Murphy, MD;  Location: [facility];  Service: General;  Laterality: N/A;   COLONOSCOPY     CORONARY ATHERECTOMY N/A 05/03/2022   Procedure: CORONARY ATHERECTOMY;  Surgeon: Orbie Pyo, MD;  Location: [facility];  Service: Cardiovascular;  Laterality: N/A;   CORONARY IMAGING/OCT N/A 05/03/2022   Procedure: INTRAVASCULAR IMAGING/OCT;  Surgeon:  Orbie Pyo, MD;  Location: [facility];  Service: Cardiovascular;  Laterality: N/A;   CORONARY PRESSURE/FFR STUDY N/A 05/03/2022   Procedure: INTRAVASCULAR PRESSURE WIRE/FFR STUDY;  Surgeon: Orbie Pyo, MD;  Location: [facility];  Service: Cardiovascular;  Laterality: N/A;   CORONARY STENT INTERVENTION N/A 05/03/2022   Procedure: CORONARY STENT INTERVENTION;  Surgeon: Orbie Pyo, MD;  Location: [facility];  Service: Cardiovascular;  Laterality: N/A;  lad   EYE SURGERY Bilateral 08/2018, 09/2018   FOOT SURGERY Left    10/2019   KNEE ARTHROSCOPY Left    LEFT HEART  CATH AND CORONARY ANGIOGRAPHY N/A 01/20/2019   Procedure: LEFT HEART CATH AND CORONARY ANGIOGRAPHY;  Surgeon: Lyn Records, MD;  Location: [facility];  Service: Cardiovascular;  Laterality: N/A;   LYMPH NODE DISSECTION     OOPHORECTOMY     Right total knee replacement     RIGHT/LEFT HEART CATH AND CORONARY ANGIOGRAPHY N/A 05/03/2022   Procedure: RIGHT/LEFT HEART CATH AND CORONARY ANGIOGRAPHY;  Surgeon: Orbie Pyo, MD;  Location: [facility];  Service: Cardiovascular;  Laterality: N/A;   ROUX-EN-Y GASTRIC BYPASS  2011   SKIN TAG REMOVAL     THYROIDECTOMY     TONSILLECTOMY AND ADENOIDECTOMY     TOOTH EXTRACTION  2023   UPPER GASTROINTESTINAL ENDOSCOPY     UPPER GI ENDOSCOPY  2024   Social History   Occupational History   Occupation: Research scientist (medical)   Occupation: Retired  Tobacco Use   Smoking status: Former    Current packs/day: 0.00    Average packs/day: 1.5 packs/day for 15.0 years (22.5 ttl pk-yrs)    Types: Cigarettes    Start date: 55    Quit date: 1992    Years since quitting: 32.9    Passive exposure: Never   Smokeless tobacco: Never  Vaping Use   Vaping status: Never Used  Substance and Sexual Activity   Alcohol use: Yes    Comment: rarely   Drug use: Never   Sexual activity: Not Currently

## 2023-10-17 ENCOUNTER — Encounter: Payer: Self-pay | Admitting: "Endocrinology

## 2023-10-17 ENCOUNTER — Ambulatory Visit (INDEPENDENT_AMBULATORY_CARE_PROVIDER_SITE_OTHER): Payer: Medicare Other | Admitting: "Endocrinology

## 2023-10-17 ENCOUNTER — Other Ambulatory Visit: Payer: Self-pay | Admitting: "Endocrinology

## 2023-10-17 VITALS — BP 115/70 | HR 73 | Ht 71.0 in | Wt 174.0 lb

## 2023-10-17 DIAGNOSIS — E890 Postprocedural hypothyroidism: Secondary | ICD-10-CM

## 2023-10-17 DIAGNOSIS — E1165 Type 2 diabetes mellitus with hyperglycemia: Secondary | ICD-10-CM

## 2023-10-17 DIAGNOSIS — Z7984 Long term (current) use of oral hypoglycemic drugs: Secondary | ICD-10-CM

## 2023-10-17 DIAGNOSIS — Z7985 Long-term (current) use of injectable non-insulin antidiabetic drugs: Secondary | ICD-10-CM

## 2023-10-17 DIAGNOSIS — E7800 Pure hypercholesterolemia, unspecified: Secondary | ICD-10-CM | POA: Diagnosis not present

## 2023-10-17 LAB — POCT GLYCOSYLATED HEMOGLOBIN (HGB A1C): Hemoglobin A1C: 8.9 % — AB (ref 4.0–5.6)

## 2023-10-17 MED ORDER — BAQSIMI ONE PACK 3 MG/DOSE NA POWD
1.0000 | NASAL | 3 refills | Status: AC | PRN
Start: 2023-10-17 — End: ?

## 2023-10-17 MED ORDER — LANTUS SOLOSTAR 100 UNIT/ML ~~LOC~~ SOPN: 10.0000 [IU] | PEN_INJECTOR | Freq: Every day | SUBCUTANEOUS | 1 refills | Status: DC

## 2023-10-17 MED ORDER — DEXCOM G7 SENSOR MISC: 1.0000 | 0 refills | Status: DC

## 2023-10-17 NOTE — Progress Notes (Signed)
Outpatient Endocrinology Note Savannah Barber, MD  10/17/23   Savannah [name] February 07, 1956 [id number]  Referring Provider: Ardith Dark, MD Primary Care Provider: Ardith Dark, MD Reason for consultation: Subjective   Assessment & Plan  Diagnoses and all orders for this visit:  Uncontrolled type 2 diabetes mellitus with hyperglycemia (HCC) -     POCT glycosylated hemoglobin (Hb A1C) -     Continuous Glucose Sensor (DEXCOM G7 SENSOR) MISC; 1 Device by Does not apply route continuous. -     insulin glargine (LANTUS SOLOSTAR) 100 UNIT/ML Solostar Pen; Inject 10 Units into the skin daily. -     Glucagon (BAQSIMI ONE PACK) 3 MG/DOSE POWD; Place 1 Device into the nose as needed (Low blood sugar with impaired consciousness). -     Ambulatory referral to diabetic education -     Lipid panel -     TSH(Reflex)  Long term (current) use of oral hypoglycemic drugs  Long-term (current) use of injectable non-insulin antidiabetic drugs  Pure hypercholesterolemia  Postoperative hypothyroidism   History of thyroidectomy on levothyroxine po every day 04/03/23 TSH WNL at 2.1 Ordered repeat lab   Diabetes Type II complicated by heart stents, neuropathy, nephropathy  She took insulin from 2004-2011, when she had gastric bypass surgery Lab Results  Component Value Date   GFR 28.90 (L) 04/03/2023   Hba1c goal less than 7, current Hba1c is  Lab Results  Component Value Date   HGBA1C 8.9 (A) 10/17/2023   Will recommend the following: Lantus 10 units every day  Trulicity 4.5 mg weekly Repaglinide 2 mg tid  Stop Invokana 300 mg every day-gets UTIs and has pelvic lichen sclerosis   Ordered DexCom Ordered DM education  No known contraindications/side effects to any of above medications No history of MEN syndrome/medullary thyroid cancer/pancreatitis or pancreatic cancer in self or family Glucagon discussed and prescribed with refills on 10/17/23  -Last LD and Tg are as  follows: Lab Results  Component Value Date   LDLCALC 53 02/22/2023    Lab Results  Component Value Date   TRIG 114 02/22/2023   -On atorvastatin 40 mg QD -Follow low fat diet and exercise   -Blood pressure goal <140/90 - Microalbumin/creatinine goal is < 30 -Last MA/Cr is as follows: Lab Results  Component Value Date   MICROALBUR 0.7 02/22/2023   -not on ACE/ARB  -diet changes including salt restriction -limit eating outside -counseled BP targets per standards of diabetes care -uncontrolled blood pressure can lead to retinopathy, nephropathy and cardiovascular and atherosclerotic heart disease  Reviewed and counseled on: -A1C target -Blood sugar targets -Complications of uncontrolled diabetes  -Checking blood sugar before meals and bedtime and bring log next visit -All medications with mechanism of action and side effects -Hypoglycemia management: rule of 15's, Glucagon Emergency Kit and medical alert ID -low-carb low-fat plate-method diet -At least 20 minutes of physical activity per day -Annual dilated retinal eye exam and foot exam -compliance and follow up needs -follow up as scheduled or earlier if problem gets worse  Call if blood sugar is less than 70 or consistently above 250    Take a 15 gm snack of carbohydrate at bedtime before you go to sleep if your blood sugar is less than 100.    If you are going to fast after midnight for a test or procedure, ask your physician for instructions on how to reduce/decrease your insulin dose.    Call if blood sugar is less than  70 or consistently above 250  -Treating a low sugar by rule of 15  (15 gms of sugar every 15 min until sugar is more than 70) If you feel your sugar is low, test your sugar to be sure If your sugar is low (less than 70), then take 15 grams of a fast acting Carbohydrate (3-4 glucose tablets or glucose gel or 4 ounces of juice or regular soda) Recheck your sugar 15 min after treating low to make sure  it is more than 70 If sugar is still less than 70, treat again with 15 grams of carbohydrate          Don't drive the hour of hypoglycemia  If unconscious/unable to eat or drink by mouth, use glucagon injection or nasal spray baqsimi and call 911. Can repeat again in 15 min if still unconscious.  Return in about 12 days (around 10/29/2023) for 1:20pm, 8 am before next visit .   I have reviewed current medications, nurse's notes, allergies, vital signs, past medical and surgical history, family medical history, and social history for this encounter. Counseled patient on symptoms, examination findings, lab findings, imaging results, treatment decisions and monitoring and prognosis. The patient understood the recommendations and agrees with the treatment plan. All questions regarding treatment plan were fully answered.  Savannah , MD  10/17/23    History of Present Illness Savannah [name] is a 67 y.o. year old female who presents for evaluation of Type II diabetes mellitus.  Ever Glave Staebell was first diagnosed in 2000.   Diabetes education +  Home diabetes regimen: Trulicity 4.5 mg weekly Repaglinide 2 mg tid  Invokana 300 mg every day  COMPLICATIONS - MI/Stroke, + angina s/p heart stents  -  retinopathy +  neuropathy +  nephropathy  BLOOD SUGAR DATA Doesn't check BG   Physical Exam  BP 115/70   Pulse 73   Ht 5\' 11"  (1.803 m)   Wt 174 lb (78.9 kg)   SpO2 95%   BMI 24.27 kg/m    Constitutional: well developed, well nourished Head: normocephalic, atraumatic Eyes: sclera anicteric, no redness Neck: supple Lungs: normal respiratory effort Neurology: alert and oriented Skin: dry, no appreciable rashes Musculoskeletal: no appreciable defects Psychiatric: normal mood and affect Diabetic Foot Exam - Simple   Simple Foot Form Diabetic Foot exam was performed with the following findings: Yes 10/17/2023  1:38 PM  Visual Inspection No deformities, no ulcerations, no other skin  breakdown bilaterally: Yes Sensation Testing Intact to touch and monofilament testing bilaterally: Yes Pulse Check Posterior Tibialis and Dorsalis pulse intact bilaterally: Yes Comments 1/3 monofilament B/L       Current Medications Patient's Medications  New Prescriptions   CONTINUOUS GLUCOSE SENSOR (DEXCOM G7 SENSOR) MISC    1 Device by Does not apply route continuous.   GLUCAGON (BAQSIMI ONE PACK) 3 MG/DOSE POWD    Place 1 Device into the nose as needed (Low blood sugar with impaired consciousness).   INSULIN GLARGINE (LANTUS SOLOSTAR) 100 UNIT/ML SOLOSTAR PEN    Inject 10 Units into the skin daily.  Previous Medications   ACETAMINOPHEN (TYLENOL) 500 MG TABLET    Take 2 tablets (1,000 mg total) by mouth every 6 (six) hours as needed for mild pain or fever.   AMOXICILLIN-CLAVULANATE (AUGMENTIN) 875-125 MG TABLET    Take 1 tablet by mouth 2 (two) times daily.   ATORVASTATIN (LIPITOR) 40 MG TABLET    Take 1 tablet (40 mg total) by mouth daily.  BENZONATATE (TESSALON) 200 MG CAPSULE    Take 1 capsule (200 mg total) by mouth 2 (two) times daily as needed for cough.   BLOOD GLUCOSE METER KIT AND SUPPLIES KIT    Use up to three  times daily as directed.   BLOOD GLUCOSE MONITORING SUPPL DEVI    1 each by Does not apply route in the morning, at noon, and at bedtime. May substitute to any manufacturer covered by patient's insurance.   CANAGLIFLOZIN (INVOKANA) 300 MG TABS TABLET    Take 1 tablet (300 mg total) by mouth daily before breakfast.   CETIRIZINE (ZYRTEC) 10 MG TABLET    Take 1 tablet (10 mg total) by mouth daily.   CHOLECALCIFEROL (VITAMIN D) 50 MCG (2000 UT) TABLET    Take 4,000 Units by mouth in the morning.   CLOPIDOGREL (PLAVIX) 75 MG TABLET    Take 1 tablet (75 mg total) by mouth daily.   CLOTRIMAZOLE (MYCELEX) 10 MG TROCHE    Take 1 tablet (10 mg total) by mouth 5 (five) times daily.   COLCHICINE (MITIGARE) 0.6 MG CAPS    Take by mouth as needed.   CYCLOBENZAPRINE (FLEXERIL) 10  MG TABLET    Take 10 mg by mouth at bedtime as needed for muscle spasms.   DICLOFENAC SODIUM (VOLTAREN) 1 % GEL    Apply 4 g topically 4 (four) times daily.   DULAGLUTIDE (TRULICITY) 4.5 MG/0.5ML SOPN    Inject 4.5 mg as directed once a week.   FLUCONAZOLE (DIFLUCAN) 150 MG TABLET    Take 1 tablet (150 mg total) by mouth See admin instructions. Take 150 mg every other day for 8 days.   FLUTICASONE (FLONASE) 50 MCG/ACT NASAL SPRAY    Place 2 sprays into both nostrils daily.   HALOBETASOL & LACTIC ACID (ULTRAVATE X, OINTMENT, EX)    Apply 1 Application topically 2 (two) times daily.   HYDROCODONE-ACETAMINOPHEN (NORCO/VICODIN) 5-325 MG TABLET    Take 1 tablet by mouth as needed for moderate pain.   LEVALBUTEROL (XOPENEX HFA) 45 MCG/ACT INHALER    Inhale 1 puff into the lungs every 4 (four) hours as needed for wheezing.   LEVOCETIRIZINE (XYZAL) 5 MG TABLET    Take 1 tablet (5 mg total) by mouth every evening.   LEVOTHYROXINE (SYNTHROID) 100 MCG TABLET    Take 1 tablet (100 mcg total) by mouth daily.   LIDOCAINE (LIDODERM) 5 %    PLACE 1 PATCH ONTO THE SKIN DAILY. REMOVE & DISCARD PATCH WITHIN 12 HOURS OR AS DIRECTED BY MD   MAGNESIUM PO    Take 3 tablets by mouth daily as needed (foot cramps).   METHOCARBAMOL (ROBAXIN) 750 MG TABLET    Take 750 mg by mouth every 6 (six) hours as needed.   MULTIPLE VITAMIN (MULTIVITAMIN) TABLET    Take 1 tablet by mouth in the morning and at bedtime.   NITROGLYCERIN (NITROSTAT) 0.4 MG SL TABLET    Place 1 tablet (0.4 mg total) under the tongue every 5 (five) minutes as needed.   ONETOUCH ULTRA TEST STRIP    USE AS INSTRUCTED   PANTOPRAZOLE (PROTONIX) 40 MG TABLET    TAKE 1 TABLET BY MOUTH TWICE A DAY   PHENAZOPYRIDINE (PYRIDIUM) 100 MG TABLET    Take 1 tablet (100 mg total) by mouth 3 (three) times daily with meals.   REPAGLINIDE (PRANDIN) 2 MG TABLET    Take 1 tablet (2 mg total) by mouth 3 (three) times daily before meals.  TRAMADOL (ULTRAM) 50 MG TABLET    Take 50  mg by mouth every 6 (six) hours as needed (pain).  Modified Medications   No medications on file  Discontinued Medications   No medications on file    Allergies Allergies  Allergen Reactions   Ace Inhibitors Cough   Caffeine     PVCs   Ciprofloxacin Itching   Morphine And Codeine Itching    Pt tolerates medication if given with diphenhydramine   Mucinex [Guaifenesin Er]     PVCs   Nsaids     Gastric Bypass Surgery - unable to take, tolerates ec 81 aspirin    Other     Antihistamine-alkylamine - PVCs tolerates benadryl    Silicone Hives   Tape Hives    Past Medical History Past Medical History:  Diagnosis Date   ABDOMINAL PAIN, CHRONIC 10/20/2009   Acute cystitis 08/17/2009   ALLERGIC RHINITIS 11/21/2007   Allergy    ASYMPTOMATIC POSTMENOPAUSAL STATUS 09/29/2008   Bariatric surgery status 07/07/2010   Bariatric surgery status 07/07/2010   Qualifier: Diagnosis of  By: Everardo All MD, Sean A    Cancer [facility])    uterine   Cataract    Coronary artery disease    DEPRESSION 11/21/2007   DIABETES MELLITUS, TYPE II 07/08/2007   DYSPNEA 05/20/2009   Edema 05/20/2009   Eosinophilic esophagitis 02/13/2008   FEVER UNSPECIFIED 10/20/2009   FEVER, HX OF 03/09/2010   GERD (gastroesophageal reflux disease)    Headache(784.0) 02/06/2010   Hepatomegaly 01/06/2008   HYPERLIPIDEMIA 07/08/2007   HYPERTENSION 07/08/2007   Kidney disease    followed by nephrology, per patient   LEUKOPENIA, MILD 09/29/2008   Lymphedema    left leg, dx approximately in 03/2023 per patient   Melanoma in situ of left upper arm (HCC) 04/03/2023   OBESITY 01/06/2008   OSTEOARTHRITIS 01/06/2008   Other chronic nonalcoholic liver disease 01/06/2008   PERIPHERAL NEUROPATHY 11/21/2007   Peripheral neuropathy    Postsurgical hypothyroidism 09/19/2010   SINUSITIS- ACUTE-NOS 11/21/2007   TB SKIN TEST, POSITIVE 02/06/2010   THYROID NODULE 03/09/2010    Past Surgical History Past Surgical History:   Procedure Laterality Date   ABDOMINAL HYSTERECTOMY     BASAL CELL CARCINOMA EXCISION     CARDIAC CATHETERIZATION     CHOLECYSTECTOMY N/A 05/11/2021   Procedure: LAPAROSCOPIC CHOLECYSTECTOMY WITH POSSIBLE  INTRAOPERATIVE CHOLANGIOGRAM;  Surgeon: Luretha Murphy, MD;  Location: [facility];  Service: General;  Laterality: N/A;   COLONOSCOPY     CORONARY ATHERECTOMY N/A 05/03/2022   Procedure: CORONARY ATHERECTOMY;  Surgeon: Orbie Pyo, MD;  Location: [facility] INVASIVE CV LAB;  Service: Cardiovascular;  Laterality: N/A;   CORONARY IMAGING/OCT N/A 05/03/2022   Procedure: INTRAVASCULAR IMAGING/OCT;  Surgeon: Orbie Pyo, MD;  Location: [facility] INVASIVE CV LAB;  Service: Cardiovascular;  Laterality: N/A;   CORONARY PRESSURE/FFR STUDY N/A 05/03/2022   Procedure: INTRAVASCULAR PRESSURE WIRE/FFR STUDY;  Surgeon: Orbie Pyo, MD;  Location: [facility] INVASIVE CV LAB;  Service: Cardiovascular;  Laterality: N/A;   CORONARY STENT INTERVENTION N/A 05/03/2022   Procedure: CORONARY STENT INTERVENTION;  Surgeon: Orbie Pyo, MD;  Location: [facility];  Service: Cardiovascular;  Laterality: N/A;  lad   EYE SURGERY Bilateral 08/2018, 09/2018   FOOT SURGERY Left    10/2019   KNEE ARTHROSCOPY Left    LEFT HEART CATH AND CORONARY ANGIOGRAPHY N/A 01/20/2019   Procedure: LEFT HEART CATH AND CORONARY ANGIOGRAPHY;  Surgeon: Lyn Records, MD;  Location[facility]  INVASIVE CV LAB;  Service: Cardiovascular;  Laterality: N/A;   LYMPH NODE DISSECTION     OOPHORECTOMY     Right total knee replacement     RIGHT/LEFT HEART CATH AND CORONARY ANGIOGRAPHY N/A 05/03/2022   Procedure: RIGHT/LEFT HEART CATH AND CORONARY ANGIOGRAPHY;  Surgeon: Orbie Pyo, MD;  Location: [facility] INVASIVE CV LAB;  Service: Cardiovascular;  Laterality: N/A;   ROUX-EN-Y GASTRIC BYPASS  2011   SKIN TAG REMOVAL     THYROIDECTOMY     TONSILLECTOMY AND ADENOIDECTOMY     TOOTH EXTRACTION  2023   UPPER GASTROINTESTINAL ENDOSCOPY     UPPER GI  ENDOSCOPY  2024    Family History family history includes Bladder Cancer in her sister; Breast cancer in her maternal aunt and sister; Colon cancer in her maternal uncle; Congestive Heart Failure in her father; Diabetes in her father, maternal aunt, maternal grandmother, maternal uncle, mother, and paternal grandmother; Hypertension in her father; Liver cancer in her maternal aunt; Multiple sclerosis in her sister; Myelodysplastic syndrome in her father; Neuropathy in her mother and paternal aunt; Osteoporosis in her sister.  Social History Social History   Socioeconomic History   Marital status: Single    Spouse name: Not on file   Number of children: 0   Years of education: 16   Highest education level: Bachelor's degree (e.g., BA, AB, BS)  Occupational History   Occupation: Research scientist (medical)   Occupation: Retired  Tobacco Use   Smoking status: Former    Current packs/day: 0.00    Average packs/day: 1.5 packs/day for 15.0 years (22.5 ttl pk-yrs)    Types: Cigarettes    Start date: 73    Quit date: 1992    Years since quitting: 32.9    Passive exposure: Never   Smokeless tobacco: Never  Vaping Use   Vaping status: Never Used  Substance and Sexual Activity   Alcohol use: Yes    Comment: rarely   Drug use: Never   Sexual activity: Not Currently  Other Topics Concern   Not on file  Social History Narrative   Not on file   Social Determinants of Health   Financial Resource Strain: Low Risk  (02/20/2023)   Overall Financial Resource Strain (CARDIA)    Difficulty of Paying Living Expenses: Not hard at all  Food Insecurity: No Food Insecurity (02/20/2023)   Hunger Vital Sign    Worried About Running Out of Food in the Last Year: Never true    Ran Out of Food in the Last Year: Never true  Transportation Needs: No Transportation Needs (02/20/2023)   PRAPARE - Administrator, Civil Service (Medical): No    Lack of Transportation (Non-Medical): No  Physical Activity:  Unknown (02/20/2023)   Exercise Vital Sign    Days of Exercise per Week: 0 days    Minutes of Exercise per Session: Not on file  Stress: No Stress Concern Present (02/20/2023)   [employer] of Occupational Health - Occupational Stress Questionnaire    Feeling of Stress : Not at all  Social Connections: Moderately Integrated (02/20/2023)   Social Connection and Isolation Panel [NHANES]    Frequency of Communication with Friends and Family: More than three times a week    Frequency of Social Gatherings with Friends and Family: Twice a week    Attends Religious Services: More than 4 times per year    Active Member of [employer] or Organizations: Yes    Attends Banker Meetings: More than  4 times per year    Marital Status: Never married  Intimate Partner Violence: Not At Risk (05/19/2020)   Humiliation, Afraid, Rape, and Kick questionnaire    Fear of Current or Ex-Partner: No    Emotionally Abused: No    Physically Abused: No    Sexually Abused: No    Lab Results  Component Value Date   HGBA1C 8.9 (A) 10/17/2023   HGBA1C 7.1 (H) 02/22/2023   HGBA1C 7.5 (A) 10/15/2022   Lab Results  Component Value Date   CHOL 114 02/22/2023   Lab Results  Component Value Date   HDL 41 (L) 02/22/2023   Lab Results  Component Value Date   LDLCALC 53 02/22/2023   Lab Results  Component Value Date   TRIG 114 02/22/2023   Lab Results  Component Value Date   CHOLHDL 2.8 02/22/2023   Lab Results  Component Value Date   CREATININE 1.78 (H) 04/03/2023   Lab Results  Component Value Date   GFR 28.90 (L) 04/03/2023   Lab Results  Component Value Date   MICROALBUR 0.7 02/22/2023      Component Value Date/Time   NA 138 04/03/2023 1631   NA 141 05/01/2022 0943   K 5.0 04/03/2023 1631   CL 107 04/03/2023 1631   CO2 23 04/03/2023 1631   GLUCOSE 171 (H) 04/03/2023 1631   BUN 27 (H) 04/03/2023 1631   BUN 22 05/01/2022 0943   CREATININE 1.78 (H) 04/03/2023 1631   CREATININE  1.51 (H) 02/22/2023 1513   CALCIUM 9.1 04/03/2023 1631   CALCIUM 8.9 01/30/2013 1017   PROT 6.7 04/03/2023 1631   PROT 5.8 (L) 10/09/2021 1214   ALBUMIN 4.1 04/03/2023 1631   ALBUMIN 4.0 10/09/2021 1214   AST 35 04/03/2023 1631   AST 25 02/29/2020 1303   ALT 51 (H) 04/03/2023 1631   ALT 37 02/29/2020 1303   ALKPHOS 105 04/03/2023 1631   BILITOT 0.4 04/03/2023 1631   BILITOT 0.4 10/09/2021 1214   BILITOT 0.4 02/29/2020 1303   GFRNONAA >60 05/13/2021 0645   GFRNONAA 63 08/16/2020 1207   GFRAA 73 08/16/2020 1207      Latest Ref Rng & Units 04/03/2023    4:31 PM 02/22/2023    3:13 PM 11/20/2022    3:15 PM  BMP  Glucose 70 - 99 mg/dL 161  096  045   BUN 6 - 23 mg/dL 27  19  17    Creatinine 0.40 - 1.20 mg/dL 4.09  8.11  9.14   BUN/Creat Ratio 6 - 22 (calc)  13    Sodium 135 - 145 mEq/L 138  139  143   Potassium 3.5 - 5.1 mEq/L 5.0  4.9  4.7   Chloride 96 - 112 mEq/L 107  109  110   CO2 19 - 32 mEq/L 23  24  24    Calcium 8.4 - 10.5 mg/dL 9.1  8.7  8.9        Component Value Date/Time   WBC 5.2 04/03/2023 1631   RBC 3.83 (L) 04/03/2023 1631   HGB 12.1 04/03/2023 1631   HGB 13.6 05/01/2022 0943   HCT 37.9 04/03/2023 1631   HCT 40.5 05/01/2022 0943   PLT 215.0 04/03/2023 1631   PLT 187 05/01/2022 0943   MCV 99.0 04/03/2023 1631   MCV 96 05/01/2022 0943   MCH 31.8 02/22/2023 1513   MCHC 31.9 04/03/2023 1631   RDW 14.6 04/03/2023 1631   RDW 13.0 05/01/2022 0943   LYMPHSABS 1.4  04/03/2023 1631   LYMPHSABS 1.2 05/01/2022 0943   MONOABS 0.4 04/03/2023 1631   EOSABS 0.4 04/03/2023 1631   EOSABS 0.3 05/01/2022 0943   BASOSABS 0.0 04/03/2023 1631   BASOSABS 0.0 05/01/2022 0943     Parts of this note may have been dictated using voice recognition software. There may be variances in spelling and vocabulary which are unintentional. Not all errors are proofread. Please notify the Thereasa Parkin if any discrepancies are noted or if the meaning of any statement is not clear.

## 2023-10-18 ENCOUNTER — Telehealth: Payer: Self-pay | Admitting: Dietician

## 2023-10-18 NOTE — Telephone Encounter (Signed)
Returned patient call. Patient states that she has not taken insulin in >10 years and needs a review.  Instructed her to begin the insulin in the am as directed as she states that she has the knowledge to do so.  She states that she does need a review. She has a new CGM and needs training.  She does have a blood glucose meter. Chart reviewed and noted that MD desires for patient to have further diabetes education.  Appointment made with Bonita Quin, CDCES for 10/23/2023.  Oran Rein, RD, LDN, CDCES

## 2023-10-22 ENCOUNTER — Other Ambulatory Visit (INDEPENDENT_AMBULATORY_CARE_PROVIDER_SITE_OTHER): Payer: Self-pay | Admitting: Otolaryngology

## 2023-10-22 ENCOUNTER — Other Ambulatory Visit: Payer: Self-pay

## 2023-10-22 ENCOUNTER — Other Ambulatory Visit: Payer: Medicare Other

## 2023-10-22 DIAGNOSIS — E1165 Type 2 diabetes mellitus with hyperglycemia: Secondary | ICD-10-CM

## 2023-10-22 MED ORDER — BD PEN NEEDLE MINI U/F 31G X 5 MM MISC: 1.0000 | Freq: Every day | 0 refills | Status: DC

## 2023-10-23 ENCOUNTER — Encounter: Payer: Medicare Other | Attending: "Endocrinology | Admitting: Nutrition

## 2023-10-23 ENCOUNTER — Ambulatory Visit: Payer: Medicare Other | Admitting: Nutrition

## 2023-10-23 DIAGNOSIS — E1151 Type 2 diabetes mellitus with diabetic peripheral angiopathy without gangrene: Secondary | ICD-10-CM | POA: Diagnosis not present

## 2023-10-23 DIAGNOSIS — Z794 Long term (current) use of insulin: Secondary | ICD-10-CM | POA: Insufficient documentation

## 2023-10-24 ENCOUNTER — Telehealth: Payer: Self-pay | Admitting: Dietician

## 2023-10-24 DIAGNOSIS — M47812 Spondylosis without myelopathy or radiculopathy, cervical region: Secondary | ICD-10-CM | POA: Diagnosis not present

## 2023-10-24 DIAGNOSIS — M47816 Spondylosis without myelopathy or radiculopathy, lumbar region: Secondary | ICD-10-CM | POA: Diagnosis not present

## 2023-10-24 DIAGNOSIS — M791 Myalgia, unspecified site: Secondary | ICD-10-CM | POA: Diagnosis not present

## 2023-10-24 DIAGNOSIS — G894 Chronic pain syndrome: Secondary | ICD-10-CM | POA: Diagnosis not present

## 2023-10-24 NOTE — Telephone Encounter (Signed)
Returned patient call. She states that her Dexcom that was placed 10/23/2023 fell off last night.  She states that she likes long baths.  Instructed her to get waterproof overpatches to place over the dexcom and to call dexcom tech support to see if she can get a replacement center.   Number provided. She would like to pick a sensor up from our office as well. Dexcom G7 [id number] expiration 09/12/2023 left at front desk. Patient states that pharmacy has faxed [facility] office to work on prior authorization for the Dexcom G7.  Oran Rein, RD, LDN, CDCES

## 2023-10-28 NOTE — Progress Notes (Signed)
Patient is here today to "help me with my eating".  She admits to having an eating disorder of hoarding food and can not stop eating sweets. Medications:  Lantus 10u every day.  Has just started this, but does not like to do this.  SBGM :  meter.  Tests acB 4-5 days/wk  readings variable.  Exercise:  none now.  Used to walk for 20-30 minutes but is not doing it now Diet:  varies daily.Typical day: 10-12  hot chocolate or coffee with heavy whipping cream and Splenda.   12-6 snacks on small items with occasional meal of sandwich or salads with much dressing  7PM; bath of 3 hours where she will eat "many white chocolate macaroon cookies.   Discussion:  Why blood sugars are going high and what she can do to decrease her insulin resistance-exercise, weight loss and lower blood sugars SBGM:  talked her into using the Dexcon with reading going to her phone.   Dexcom: lot:#: [id number], exp. 10/25. This was placed on her left arm.  I am hoping she will see blood sugar rise from meals and snacks she is eating and will help to reduce these amounts.  Discussed that all foods raise blood sugar and the 3 basic food groups-carbs, protein and fat, and how each raised blood sugar and need to limit amounts of each at each meal.  Discussed what foods are in each group and how much she should be eating. Discussed how the Lantus insulin works-giving her a little insulin all day and all night to bring down high morning blood sugars and allowing pancrease to conserve insulin to be used when blood sugars rise after meals-preventing her from taking another injection before meals.  Discussed importance of taking this every day.  Need for exercise- and ways to work in doing 20-30 minutes of walking each day Believe overall understanding is very good, but motivation may be lacking or needs for food hoarding need to be addressed.  Stressed need for better control to prevent or possible reverse some of the nerve damage.

## 2023-10-29 ENCOUNTER — Encounter: Payer: Self-pay | Admitting: "Endocrinology

## 2023-10-29 ENCOUNTER — Ambulatory Visit (INDEPENDENT_AMBULATORY_CARE_PROVIDER_SITE_OTHER): Payer: Medicare Other | Admitting: "Endocrinology

## 2023-10-29 VITALS — BP 118/60 | HR 80 | Resp 20 | Ht 71.0 in | Wt 183.0 lb

## 2023-10-29 DIAGNOSIS — E7800 Pure hypercholesterolemia, unspecified: Secondary | ICD-10-CM

## 2023-10-29 DIAGNOSIS — E1165 Type 2 diabetes mellitus with hyperglycemia: Secondary | ICD-10-CM | POA: Diagnosis not present

## 2023-10-29 DIAGNOSIS — E890 Postprocedural hypothyroidism: Secondary | ICD-10-CM

## 2023-10-29 DIAGNOSIS — Z7985 Long-term (current) use of injectable non-insulin antidiabetic drugs: Secondary | ICD-10-CM | POA: Diagnosis not present

## 2023-10-29 DIAGNOSIS — Z7984 Long term (current) use of oral hypoglycemic drugs: Secondary | ICD-10-CM

## 2023-10-29 MED ORDER — LANCETS MISC. MISC: 1.0000 | Freq: Three times a day (TID) | 3 refills | Status: AC

## 2023-10-29 MED ORDER — LANCET DEVICE MISC: 1.0000 | Freq: Three times a day (TID) | 0 refills | Status: AC

## 2023-10-29 MED ORDER — BLOOD GLUCOSE MONITORING SUPPL DEVI: 1.0000 | Freq: Three times a day (TID) | 0 refills | Status: AC

## 2023-10-29 MED ORDER — BLOOD GLUCOSE TEST VI STRP: 1.0000 | ORAL_STRIP | Freq: Three times a day (TID) | 3 refills | Status: DC

## 2023-10-29 NOTE — Progress Notes (Signed)
Outpatient Endocrinology Note Savannah Mansfield, MD  10/29/23   Savannah [name] Savannah [name] [id number]  Referring Provider: Ardith Dark, MD Primary Care Provider: Ardith Dark, MD Reason for consultation: Subjective   Assessment & Plan  Diagnoses and all orders for this visit:  Uncontrolled type 2 diabetes mellitus with hyperglycemia (HCC)  Long term (current) use of oral hypoglycemic drugs  Long-term (current) use of injectable non-insulin antidiabetic drugs  Pure hypercholesterolemia  Postoperative hypothyroidism  Other orders -     Blood Glucose Monitoring Suppl DEVI; 1 each by Does not apply route in the morning, at noon, and at bedtime. May substitute to any manufacturer covered by patient's insurance. -     Glucose Blood (BLOOD GLUCOSE TEST STRIPS) STRP; 1 each by In Vitro route in the morning, at noon, and at bedtime. May substitute to any manufacturer covered by patient's insurance. -     Lancet Device MISC; 1 each by Does not apply route in the morning, at noon, and at bedtime. May substitute to any manufacturer covered by patient's insurance. -     Lancets Misc. MISC; 1 each by Does not apply route in the morning, at noon, and at bedtime. May substitute to any manufacturer covered by patient's insurance.    History of thyroidectomy on levothyroxine po every day 04/03/23 TSH WNL at 2.1 Ordered repeat lab-pending    Diabetes Type II complicated by heart stents, neuropathy, nephropathy  She took insulin from 2004-2011, when she had gastric bypass surgery Lab Results  Component Value Date   GFR 28.90 (L) 04/03/2023   Hba1c goal less than 7, current Hba1c is  Lab Results  Component Value Date   HGBA1C 8.9 (A) 10/17/2023   Will recommend the following: Lantus 12-14 units qam (will not increase if experiences low blood sugars) Trulicity 4.5 mg weekly Repaglinide 2 mg tid   Stopped Invokana 300 mg every day-gets UTIs and has pelvic lichen sclerosis    Ordered DexCom Ordered DM education previously   No known contraindications/side effects to any of above medications No history of MEN syndrome/medullary thyroid cancer/pancreatitis or pancreatic cancer in self or family Glucagon discussed and prescribed with refills on 10/29/23  -Last LD and Tg are as follows: Lab Results  Component Value Date   LDLCALC 53 02/22/2023    Lab Results  Component Value Date   TRIG 114 02/22/2023   -On atorvastatin 40 mg QD -Follow low fat diet and exercise   -Blood pressure goal <140/90 - Microalbumin/creatinine goal is < 30 -Last MA/Cr is as follows: Lab Results  Component Value Date   MICROALBUR 0.7 02/22/2023   -not on ACE/ARB  -diet changes including salt restriction -limit eating outside -counseled BP targets per standards of diabetes care -uncontrolled blood pressure can lead to retinopathy, nephropathy and cardiovascular and atherosclerotic heart disease  Reviewed and counseled on: -A1C target -Blood sugar targets -Complications of uncontrolled diabetes  -Checking blood sugar before meals and bedtime and bring log next visit -All medications with mechanism of action and side effects -Hypoglycemia management: rule of 15's, Glucagon Emergency Kit and medical alert ID -low-carb low-fat plate-method diet -At least 20 minutes of physical activity per day -Annual dilated retinal eye exam and foot exam -compliance and follow up needs -follow up as scheduled or earlier if problem gets worse  Call if blood sugar is less than 70 or consistently above 250    Take a 15 gm snack of carbohydrate at bedtime before you go  to sleep if your blood sugar is less than 100.    If you are going to fast after midnight for a test or procedure, ask your physician for instructions on how to reduce/decrease your insulin dose.    Call if blood sugar is less than 70 or consistently above 250  -Treating a low sugar by rule of 15  (15 gms of sugar every 15  min until sugar is more than 70) If you feel your sugar is low, test your sugar to be sure If your sugar is low (less than 70), then take 15 grams of a fast acting Carbohydrate (3-4 glucose tablets or glucose gel or 4 ounces of juice or regular soda) Recheck your sugar 15 min after treating low to make sure it is more than 70 If sugar is still less than 70, treat again with 15 grams of carbohydrate          Don't drive the hour of hypoglycemia  If unconscious/unable to eat or drink by mouth, use glucagon injection or nasal spray baqsimi and call 911. Can repeat again in 15 min if still unconscious.  Return in about 6 weeks (around 12/10/2023) for visit, fasting labs before next visit.   I have reviewed current medications, nurse's notes, allergies, vital signs, past medical and surgical history, family medical history, and social history for this encounter. Counseled patient on symptoms, examination findings, lab findings, imaging results, treatment decisions and monitoring and prognosis. The patient understood the recommendations and agrees with the treatment plan. All questions regarding treatment plan were fully answered.  Savannah Kalifornsky, MD  10/29/23    History of Present Illness Savannah [name] is a 67 y.o. year old female who presents for evaluation of Type II diabetes mellitus.  Savannah [name] was first diagnosed in 2000.   Diabetes education +  Home diabetes regimen: Trulicity 4.5 mg weekly Repaglinide 2 mg tid  Invokana 300 mg every day  COMPLICATIONS - MI/Stroke, + angina s/p heart stents  -  retinopathy +  neuropathy +  nephropathy  BLOOD SUGAR DATA  CGM interpretation: At today's visit, we reviewed her CGM downloads. The full report is scanned in the media. Reviewing the CGM trends, BG are elevated in daytime with a drop at 3am. Patient reported a one time day to a reason, and not a pattern.   Physical Exam  BP 118/60 (BP Location: Left Arm, Patient Position: Sitting,  Cuff Size: Normal)   Pulse 80   Resp 20   Ht 5\' 11"  (1.803 m)   Wt 183 lb (83 kg)   SpO2 98%   BMI 25.52 kg/m    Constitutional: well developed, well nourished Head: normocephalic, atraumatic Eyes: sclera anicteric, no redness Neck: supple Lungs: normal respiratory effort Neurology: alert and oriented Skin: dry, no appreciable rashes Musculoskeletal: no appreciable defects Psychiatric: normal mood and affect Diabetic Foot Exam - Simple   No data filed      Current Medications Patient's Medications  New Prescriptions   BLOOD GLUCOSE MONITORING SUPPL DEVI    1 each by Does not apply route in the morning, at noon, and at bedtime. May substitute to any manufacturer covered by patient's insurance.   GLUCOSE BLOOD (BLOOD GLUCOSE TEST STRIPS) STRP    1 each by In Vitro route in the morning, at noon, and at bedtime. May substitute to any manufacturer covered by patient's insurance.   LANCET DEVICE MISC    1 each by Does not apply route in the  morning, at noon, and at bedtime. May substitute to any manufacturer covered by patient's insurance.   LANCETS MISC. MISC    1 each by Does not apply route in the morning, at noon, and at bedtime. May substitute to any manufacturer covered by patient's insurance.  Previous Medications   ACETAMINOPHEN (TYLENOL) 500 MG TABLET    Take 2 tablets (1,000 mg total) by mouth every 6 (six) hours as needed for mild pain or fever.   AMOXICILLIN-CLAVULANATE (AUGMENTIN) 875-125 MG TABLET    Take 1 tablet by mouth 2 (two) times daily.   ATORVASTATIN (LIPITOR) 40 MG TABLET    Take 1 tablet (40 mg total) by mouth daily.   BENZONATATE (TESSALON) 200 MG CAPSULE    Take 1 capsule (200 mg total) by mouth 2 (two) times daily as needed for cough.   BLOOD GLUCOSE METER KIT AND SUPPLIES KIT    Use up to three  times daily as directed.   BLOOD GLUCOSE MONITORING SUPPL DEVI    1 each by Does not apply route in the morning, at noon, and at bedtime. May substitute to any  manufacturer covered by patient's insurance.   CANAGLIFLOZIN (INVOKANA) 300 MG TABS TABLET    Take 1 tablet (300 mg total) by mouth daily before breakfast.   CETIRIZINE (ZYRTEC) 10 MG TABLET    TAKE 1 TABLET BY MOUTH EVERY DAY   CHOLECALCIFEROL (VITAMIN D) 50 MCG (2000 UT) TABLET    Take 4,000 Units by mouth in the morning.   CLOPIDOGREL (PLAVIX) 75 MG TABLET    Take 1 tablet (75 mg total) by mouth daily.   CLOTRIMAZOLE (MYCELEX) 10 MG TROCHE    Take 1 tablet (10 mg total) by mouth 5 (five) times daily.   COLCHICINE (MITIGARE) 0.6 MG CAPS    Take by mouth as needed.   CONTINUOUS GLUCOSE SENSOR (DEXCOM G7 SENSOR) MISC    USE AS DIRECTED   CYCLOBENZAPRINE (FLEXERIL) 10 MG TABLET    Take 10 mg by mouth at bedtime as needed for muscle spasms.   DICLOFENAC SODIUM (VOLTAREN) 1 % GEL    Apply 4 g topically 4 (four) times daily.   DULAGLUTIDE (TRULICITY) 4.5 MG/0.5ML SOPN    Inject 4.5 mg as directed once a week.   FLUCONAZOLE (DIFLUCAN) 150 MG TABLET    Take 1 tablet (150 mg total) by mouth See admin instructions. Take 150 mg every other day for 8 days.   FLUTICASONE (FLONASE) 50 MCG/ACT NASAL SPRAY    SPRAY 2 SPRAYS INTO EACH NOSTRIL EVERY DAY   GLUCAGON (BAQSIMI ONE PACK) 3 MG/DOSE POWD    Place 1 Device into the nose as needed (Low blood sugar with impaired consciousness).   HALOBETASOL & LACTIC ACID (ULTRAVATE X, OINTMENT, EX)    Apply 1 Application topically 2 (two) times daily.   HYDROCODONE-ACETAMINOPHEN (NORCO/VICODIN) 5-325 MG TABLET    Take 1 tablet by mouth as needed for moderate pain (pain score 4-6).   INSULIN GLARGINE (LANTUS SOLOSTAR) 100 UNIT/ML SOLOSTAR PEN    Inject 10 Units into the skin daily.   INSULIN PEN NEEDLE (B-D UF III MINI PEN NEEDLES) 31G X 5 MM MISC    1 Needle by Does not apply route daily at 6 (six) AM. Use once daily as directed with Lantus   LEVALBUTEROL (XOPENEX HFA) 45 MCG/ACT INHALER    Inhale 1 puff into the lungs every 4 (four) hours as needed for wheezing.    LEVOCETIRIZINE (XYZAL) 5 MG TABLET  Take 1 tablet (5 mg total) by mouth every evening.   LEVOTHYROXINE (SYNTHROID) 100 MCG TABLET    Take 1 tablet (100 mcg total) by mouth daily.   LIDOCAINE (LIDODERM) 5 %    PLACE 1 PATCH ONTO THE SKIN DAILY. REMOVE & DISCARD PATCH WITHIN 12 HOURS OR AS DIRECTED BY MD   MAGNESIUM PO    Take 3 tablets by mouth daily as needed (foot cramps).   METHOCARBAMOL (ROBAXIN) 750 MG TABLET    Take 750 mg by mouth every 6 (six) hours as needed.   MULTIPLE VITAMIN (MULTIVITAMIN) TABLET    Take 1 tablet by mouth in the morning and at bedtime.   NITROGLYCERIN (NITROSTAT) 0.4 MG SL TABLET    Place 1 tablet (0.4 mg total) under the tongue every 5 (five) minutes as needed.   ONETOUCH ULTRA TEST STRIP    USE AS INSTRUCTED   PANTOPRAZOLE (PROTONIX) 40 MG TABLET    TAKE 1 TABLET BY MOUTH TWICE A DAY   PHENAZOPYRIDINE (PYRIDIUM) 100 MG TABLET    Take 1 tablet (100 mg total) by mouth 3 (three) times daily with meals.   REPAGLINIDE (PRANDIN) 2 MG TABLET    Take 1 tablet (2 mg total) by mouth 3 (three) times daily before meals.   TRAMADOL (ULTRAM) 50 MG TABLET    Take 50 mg by mouth every 6 (six) hours as needed (pain).  Modified Medications   No medications on file  Discontinued Medications   No medications on file    Allergies Allergies  Allergen Reactions   Ace Inhibitors Cough   Caffeine     PVCs   Ciprofloxacin Itching   Morphine And Codeine Itching    Pt tolerates medication if given with diphenhydramine   Mucinex [Guaifenesin Er]     PVCs   Nsaids     Gastric Bypass Surgery - unable to take, tolerates ec 81 aspirin    Other     Antihistamine-alkylamine - PVCs tolerates benadryl    Silicone Hives   Tape Hives    Past Medical History Past Medical History:  Diagnosis Date   ABDOMINAL PAIN, CHRONIC 10/20/2009   Acute cystitis 08/17/2009   ALLERGIC RHINITIS 11/21/2007   Allergy    ASYMPTOMATIC POSTMENOPAUSAL STATUS 09/29/2008   Bariatric surgery status  07/07/2010   Bariatric surgery status 07/07/2010   Qualifier: Diagnosis of  By: Everardo All MD, Sean A    Cancer [facility])    uterine   Cataract    Coronary artery disease    DEPRESSION 11/21/2007   DIABETES MELLITUS, TYPE II 07/08/2007   DYSPNEA 05/20/2009   Edema 05/20/2009   Eosinophilic esophagitis 02/13/2008   FEVER UNSPECIFIED 10/20/2009   FEVER, HX OF 03/09/2010   GERD (gastroesophageal reflux disease)    Headache(784.0) 02/06/2010   Hepatomegaly 01/06/2008   HYPERLIPIDEMIA 07/08/2007   HYPERTENSION 07/08/2007   Kidney disease    followed by nephrology, per patient   LEUKOPENIA, MILD 09/29/2008   Lymphedema    left leg, dx approximately in 03/2023 per patient   Melanoma in situ of left upper arm (HCC) 04/03/2023   OBESITY 01/06/2008   OSTEOARTHRITIS 01/06/2008   Other chronic nonalcoholic liver disease 01/06/2008   PERIPHERAL NEUROPATHY 11/21/2007   Peripheral neuropathy    Postsurgical hypothyroidism 09/19/2010   SINUSITIS- ACUTE-NOS 11/21/2007   TB SKIN TEST, POSITIVE 02/06/2010   THYROID NODULE 03/09/2010    Past Surgical History Past Surgical History:  Procedure Laterality Date   ABDOMINAL HYSTERECTOMY  BASAL CELL CARCINOMA EXCISION     CARDIAC CATHETERIZATION     CHOLECYSTECTOMY N/A 05/11/2021   Procedure: LAPAROSCOPIC CHOLECYSTECTOMY WITH POSSIBLE  INTRAOPERATIVE CHOLANGIOGRAM;  Surgeon: Luretha Murphy, MD;  Location: [facility];  Service: General;  Laterality: N/A;   COLONOSCOPY     CORONARY ATHERECTOMY N/A 05/03/2022   Procedure: CORONARY ATHERECTOMY;  Surgeon: Orbie Pyo, MD;  Location: [facility];  Service: Cardiovascular;  Laterality: N/A;   CORONARY IMAGING/OCT N/A 05/03/2022   Procedure: INTRAVASCULAR IMAGING/OCT;  Surgeon: Orbie Pyo, MD;  Location: [facility];  Service: Cardiovascular;  Laterality: N/A;   CORONARY PRESSURE/FFR STUDY N/A 05/03/2022   Procedure: INTRAVASCULAR PRESSURE WIRE/FFR STUDY;  Surgeon: Orbie Pyo,  MD;  Location: [facility];  Service: Cardiovascular;  Laterality: N/A;   CORONARY STENT INTERVENTION N/A 05/03/2022   Procedure: CORONARY STENT INTERVENTION;  Surgeon: Orbie Pyo, MD;  Location: [facility];  Service: Cardiovascular;  Laterality: N/A;  lad   EYE SURGERY Bilateral 08/2018, 09/2018   FOOT SURGERY Left    10/2019   KNEE ARTHROSCOPY Left    LEFT HEART CATH AND CORONARY ANGIOGRAPHY N/A 01/20/2019   Procedure: LEFT HEART CATH AND CORONARY ANGIOGRAPHY;  Surgeon: Lyn Records, MD;  Location: [facility];  Service: Cardiovascular;  Laterality: N/A;   LYMPH NODE DISSECTION     OOPHORECTOMY     Right total knee replacement     RIGHT/LEFT HEART CATH AND CORONARY ANGIOGRAPHY N/A 05/03/2022   Procedure: RIGHT/LEFT HEART CATH AND CORONARY ANGIOGRAPHY;  Surgeon: Orbie Pyo, MD;  Location: [facility];  Service: Cardiovascular;  Laterality: N/A;   ROUX-EN-Y GASTRIC BYPASS  2011   SKIN TAG REMOVAL     THYROIDECTOMY     TONSILLECTOMY AND ADENOIDECTOMY     TOOTH EXTRACTION  2023   UPPER GASTROINTESTINAL ENDOSCOPY     UPPER GI ENDOSCOPY  2024    Family History family history includes Bladder Cancer in her sister; Breast cancer in her maternal aunt and sister; Colon cancer in her maternal uncle; Congestive Heart Failure in her father; Diabetes in her father, maternal aunt, maternal grandmother, maternal uncle, mother, and paternal grandmother; Hypertension in her father; Liver cancer in her maternal aunt; Multiple sclerosis in her sister; Myelodysplastic syndrome in her father; Neuropathy in her mother and paternal aunt; Osteoporosis in her sister.  Social History Social History   Socioeconomic History   Marital status: Single    Spouse name: Not on file   Number of children: 0   Years of education: 16   Highest education level: Bachelor's degree (e.g., BA, AB, BS)  Occupational History   Occupation: Research scientist (medical)   Occupation: Retired  Tobacco  Use   Smoking status: Former    Current packs/day: 0.00    Average packs/day: 1.5 packs/day for 15.0 years (22.5 ttl pk-yrs)    Types: Cigarettes    Start date: 16    Quit date: 1992    Years since quitting: 32.9    Passive exposure: Never   Smokeless tobacco: Never  Vaping Use   Vaping status: Never Used  Substance and Sexual Activity   Alcohol use: Yes    Comment: rarely   Drug use: Never   Sexual activity: Not Currently  Other Topics Concern   Not on file  Social History Narrative   Not on file   Social Drivers of Health   Financial Resource Strain: Low Risk  (02/20/2023)  Overall Financial Resource Strain (CARDIA)    Difficulty of Paying Living Expenses: Not hard at all  Food Insecurity: No Food Insecurity (02/20/2023)   Hunger Vital Sign    Worried About Running Out of Food in the Last Year: Never true    Ran Out of Food in the Last Year: Never true  Transportation Needs: No Transportation Needs (02/20/2023)   PRAPARE - Administrator, Civil Service (Medical): No    Lack of Transportation (Non-Medical): No  Physical Activity: Unknown (02/20/2023)   Exercise Vital Sign    Days of Exercise per Week: 0 days    Minutes of Exercise per Session: Not on file  Stress: No Stress Concern Present (02/20/2023)   [employer] of Occupational Health - Occupational Stress Questionnaire    Feeling of Stress : Not at all  Social Connections: Moderately Integrated (02/20/2023)   Social Connection and Isolation Panel [NHANES]    Frequency of Communication with Friends and Family: More than three times a week    Frequency of Social Gatherings with Friends and Family: Twice a week    Attends Religious Services: More than 4 times per year    Active Member of Clubs or Organizations: Yes    Attends Banker Meetings: More than 4 times per year    Marital Status: Never married  Intimate Partner Violence: Not At Risk (05/19/2020)   Humiliation, Afraid, Rape, and  Kick questionnaire    Fear of Current or Ex-Partner: No    Emotionally Abused: No    Physically Abused: No    Sexually Abused: No    Lab Results  Component Value Date   HGBA1C 8.9 (A) 10/17/2023   HGBA1C 7.1 (H) 02/22/2023   HGBA1C 7.5 (A) 10/15/2022   Lab Results  Component Value Date   CHOL 114 02/22/2023   Lab Results  Component Value Date   HDL 41 (L) 02/22/2023   Lab Results  Component Value Date   LDLCALC 53 02/22/2023   Lab Results  Component Value Date   TRIG 114 02/22/2023   Lab Results  Component Value Date   CHOLHDL 2.8 02/22/2023   Lab Results  Component Value Date   CREATININE 1.78 (H) 04/03/2023   Lab Results  Component Value Date   GFR 28.90 (L) 04/03/2023   Lab Results  Component Value Date   MICROALBUR 0.7 02/22/2023      Component Value Date/Time   NA 138 04/03/2023 1631   NA 141 05/01/2022 0943   K 5.0 04/03/2023 1631   CL 107 04/03/2023 1631   CO2 23 04/03/2023 1631   GLUCOSE 171 (H) 04/03/2023 1631   BUN 27 (H) 04/03/2023 1631   BUN 22 05/01/2022 0943   CREATININE 1.78 (H) 04/03/2023 1631   CREATININE 1.51 (H) 02/22/2023 1513   CALCIUM 9.1 04/03/2023 1631   CALCIUM 8.9 01/30/2013 1017   PROT 6.7 04/03/2023 1631   PROT 5.8 (L) 10/09/2021 1214   ALBUMIN 4.1 04/03/2023 1631   ALBUMIN 4.0 10/09/2021 1214   AST 35 04/03/2023 1631   AST 25 02/29/2020 1303   ALT 51 (H) 04/03/2023 1631   ALT 37 02/29/2020 1303   ALKPHOS 105 04/03/2023 1631   BILITOT 0.4 04/03/2023 1631   BILITOT 0.4 10/09/2021 1214   BILITOT 0.4 02/29/2020 1303   GFRNONAA >60 05/13/2021 0645   GFRNONAA 63 08/16/2020 1207   GFRAA 73 08/16/2020 1207      Latest Ref Rng & Units 04/03/2023    4:31  PM 02/22/2023    3:13 PM 11/20/2022    3:15 PM  BMP  Glucose 70 - 99 mg/dL 865  784  696   BUN 6 - 23 mg/dL 27  19  17    Creatinine 0.40 - 1.20 mg/dL 2.95  2.84  1.32   BUN/Creat Ratio 6 - 22 (calc)  13    Sodium 135 - 145 mEq/L 138  139  143   Potassium 3.5 - 5.1  mEq/L 5.0  4.9  4.7   Chloride 96 - 112 mEq/L 107  109  110   CO2 19 - 32 mEq/L 23  24  24    Calcium 8.4 - 10.5 mg/dL 9.1  8.7  8.9        Component Value Date/Time   WBC 5.2 04/03/2023 1631   RBC 3.83 (L) 04/03/2023 1631   HGB 12.1 04/03/2023 1631   HGB 13.6 05/01/2022 0943   HCT 37.9 04/03/2023 1631   HCT 40.5 05/01/2022 0943   PLT 215.0 04/03/2023 1631   PLT 187 05/01/2022 0943   MCV 99.0 04/03/2023 1631   MCV 96 05/01/2022 0943   MCH 31.8 02/22/2023 1513   MCHC 31.9 04/03/2023 1631   RDW 14.6 04/03/2023 1631   RDW 13.0 05/01/2022 0943   LYMPHSABS 1.4 04/03/2023 1631   LYMPHSABS 1.2 05/01/2022 0943   MONOABS 0.4 04/03/2023 1631   EOSABS 0.4 04/03/2023 1631   EOSABS 0.3 05/01/2022 0943   BASOSABS 0.0 04/03/2023 1631   BASOSABS 0.0 05/01/2022 0943     Parts of this note may have been dictated using voice recognition software. There may be variances in spelling and vocabulary which are unintentional. Not all errors are proofread. Please notify the Thereasa Parkin if any discrepancies are noted or if the meaning of any statement is not clear.

## 2023-10-30 ENCOUNTER — Ambulatory Visit: Payer: Medicare Other | Admitting: Nurse Practitioner

## 2023-10-30 ENCOUNTER — Encounter: Payer: Self-pay | Admitting: Nurse Practitioner

## 2023-10-30 ENCOUNTER — Encounter: Payer: Self-pay | Admitting: "Endocrinology

## 2023-10-30 VITALS — BP 128/70 | HR 68 | Ht 70.0 in | Wt 183.1 lb

## 2023-10-30 DIAGNOSIS — R1310 Dysphagia, unspecified: Secondary | ICD-10-CM

## 2023-10-30 DIAGNOSIS — Z860101 Personal history of adenomatous and serrated colon polyps: Secondary | ICD-10-CM

## 2023-10-30 DIAGNOSIS — M791 Myalgia, unspecified site: Secondary | ICD-10-CM | POA: Diagnosis not present

## 2023-10-30 NOTE — Patient Instructions (Signed)
Follow up as needed  _______________________________________________________  If your blood pressure at your visit was 140/90 or greater, please contact your primary care physician to follow up on this.  _______________________________________________________  If you are age 69 or older, your body mass index should be between 23-30. Your Body mass index is 26.28 kg/m. If this is out of the aforementioned range listed, please consider follow up with your Primary Care Provider.  If you are age 35 or younger, your body mass index should be between 19-25. Your Body mass index is 26.28 kg/m. If this is out of the aformentioned range listed, please consider follow up with your Primary Care Provider.   ________________________________________________________  The [facility] providers would like to encourage you to use [facility] to communicate with providers for non-urgent requests or questions.  Due to long hold times on the telephone, sending your provider a message by [facility] may be a faster and more efficient way to get a response.  Please allow 48 business hours for a response.  Please remember that this is for non-urgent requests.  _______________________________________________________  Thank you for entrusting me with your care and choosing [facility].  Willette Cluster NP

## 2023-10-30 NOTE — Progress Notes (Signed)
Agree with assessment and plan as outlined.   The other thing to consider if dysphagia becomes progressive or really bothers her would be to repeat an EGD with dilation using Savary to treat cricopharyngeal bar. We only did balloon dilation to lower esophageal stricture previously. Up to her how aggressive she wants to be with this and how much it bothers her. Thanks

## 2023-10-30 NOTE — Progress Notes (Signed)
ASSESSMENT    Brief Narrative:  67 y.o.  female known to Dr. Adela Lank with a past medical history not limited to Roux-en-Y gastric bypass, colon polyps   Dysphagia, Improved . Benign esophageal stenosis on EGD, s/p dilation. Mainly has trouble now with vitamins / large pills.  No significant solid food dysphagia unless a piece of food hits a certain spot in the back of her throat which leads to coughing.  Swallows liquids okay. Savannah [name] has been evaluated by ENT and underwent laryngoscopy.  Savannah [name] had a recent MBSS with some abnormalities but no aspiration. 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  History colon polyps Small tubular adenoma removed in 2018.  A 7-year surveillance colonoscopy was recommended  See [facility] for any additional medical & surgical history   PLAN   Recommend that in the future Savannah [name] try and get any of her larger medications changed to liquid form.  Savannah [name] can always talk to the pharmacist about which medications can be crushed or broken for easier consumption.  No further recommendations at this point  HPI   Chief complaint : follow up on swallowing problems  Brief GI History:  Savannah was last seen in our office May 2024 by Hyacinth Meeker, PA.  At that time Savannah [name] was having dysphagia, hoarseness and coughing.  Savannah [name] gave a remote history of EOE.  Savannah [name] was underwent EGD which showed a benign appearing esophageal stenosis.  Esophageal dilation was performed.  Esophageal biopsies were negative, specifically there was no EOE.  In August patient contacted the office about diarrhea.  Savannah [name] was having several bouts of diarrhea a day.  We ordered stool studies and abdominal x-ray.  Multiple messages in epic in August reviewed.  Appears KUB suggested constipation.  Savannah [name] was given a bowel purge.  Savannah [name] will log bowel movements but then the diarrhea did not improve.  Savannah [name] was put on MiraLAX and Citrucel which did not help.  Dr. Adela Lank thought bowel changes could be  related to Trulicity and recommended he contact the prescribing provider about holding it for a little while to see if bowel movements  Interval History:  Savannah [name] is here for follow-up.  Savannah [name] does not recall ever holding the Trulicity but her bowel movements have improved.  Savannah [name] is having only about 1 loose bowel movement a week now.  Savannah [name] is still taking the Trulicity  Regarding the dysphagia that we were previously evaluating her for, Savannah [name] saw ENT with [facility] per our recommendation.  Savannah [name] had a laryngoscopy which was seemingly normal.  ENT ordered a MBSS  MBSS Oct 2024 Clinical Impression: Clinical Impression: Savannah [name] was seen by SLP for this moidified barium swallow study per complaints of globus sensation, trouble swallowing pills, and changes in her voice. Savannah [name] presents with an overall functional oropharyngeal swallow as evidenced by the results of this study. There were no observed instances of aspiration or significant phayrngeal residuals. Pentration (PAS 4) occurred in a few instances with thin liquid administration and one instance with nectar thick liquid (PAS 2). Reduced hyolaryngeal excusion was noted, however epiglottic inversion was complete and adequetly protected the airway. The phayrnesophageal segment opening was obsevred as partially distended. Barium retrograde below the PES was present. Barium tablet was administered and noted to stall in the middle esophagus when taken with thin liquid. Honey-thick liquid completed tablet's transit. 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 SLP recommended Ms. Hoctor continues with a regular diet with thin liquids, taking medications with thicker  liquids (e.g., smoothie) as needed. Acid reflux preactions (e.g., remain upright 30 minutes after eating) were reviewed with Ms. Trimble and Savannah [name] verbalized understanding. No further ST is indicated at this time.      GI History / Pertinent GI Studies   **All endoscopic studies may not  be included here    May 2024 EGD - Esophagogastric landmarks identified.  - 2 cm hiatal hernia.  - Benign-appearing esophageal stenosis. Dilated to 20mm with good result. Normal esophagus otherwise - no active inflammation - biopsies taken given reported history of EoE years ago. - Roux-en-Y gastrojejunostomy with gastrojejunal anastomosis characterized by healthy appearing mucosa. - A few gastric polyps. Biopsied. - Normal examined small bowel limb  Surgical [P], gastric polyp bx, polyp (1) - POLYPOID FRAGMENT OF OXYNTIC MUCOSA WITH MILD REACTIVE CHANGES - NEGATIVE FOR H. PYLORI ON H&E STAIN - NEGATIVE FOR INTESTINAL METAPLASIA, DYSPLASIA OR MALIGNANCY 2. Surgical [P], esophagus bx - SQUAMOUS MUCOSA WITH NO SPECIFIC PATHOLOGIC CHANGES - NEGATIVE FOR INCREASED INTRAEPITHELIAL EOSINOPHILS - NEGATIVE FOR DYSPLASIA OR MALIGNANCY  March 2018 colonoscopy for hematochezia - One 5 mm polyp in the sigmoid colon, removed with a cold snare. Resected and retrieved. - The examined portion of the ileum was normal. - Internal hemorrhoids. - There was significant looping of the colon. - The examination was otherwise normal  Surgical [P], gastric polyps BX - BENIGN GASTRIC MUCOSA WITH MILD CHRONIC GASTRITIS AND FUNDIC GLAND MICROCYSTIC POLYP-LIKE CHANGES CONSISTENT WITH PROTON PUMP INHIBITOR EFFECT. - A WARTHIN-STARRY STAIN IS NEGATIVE FOR HELICOBACTER PYLORI. - NEGATIVE FOR INTESTINAL METAPLASIA OR MALIGNANCY. 2. Surgical [P], mid esophagus and distal esophagus -BENIGN SQUAMOUS MUCOSA WITH NO HISTOPATHOLOGIC ABNORMALITY. -A PAS-F STAIN IS NEGATIVE FOR FUNGUS. -NEGATIVE FOR EOSINOPHILIC ESOPHAGITIS, DYSPLASIA OR MALIGNANCY. 3. Surgical [P], sigmoid, polyp -TUBULAR ADENOMA. -NO HIGH GRADE DYSPLASIA OR MALIGNANCY IDENTIFIED      Latest Ref Rng & Units 04/03/2023    4:31 PM 02/22/2023    3:13 PM 11/20/2022    3:15 PM  Hepatic Function  Total Protein 6.0 - 8.3 g/dL 6.7  5.8  6.4   Albumin 3.5 - 5.2  g/dL 4.1   3.9   AST 0 - 37 U/L 35  35  30   ALT 0 - 35 U/L 51  42  42   Alk Phosphatase 39 - 117 U/L 105   110   Total Bilirubin 0.2 - 1.2 mg/dL 0.4  0.4  0.3        Latest Ref Rng & Units 04/03/2023    4:31 PM 02/22/2023    3:13 PM 10/15/2022    2:58 PM  CBC  WBC 4.0 - 10.5 K/uL 5.2  3.5  5.3   Hemoglobin 12.0 - 15.0 g/dL 16.1  09.6  04.5   Hematocrit 36.0 - 46.0 % 37.9  35.3  37.2   Platelets 150.0 - 400.0 K/uL 215.0  181  215.0      Past Medical History:  Diagnosis Date   ABDOMINAL PAIN, CHRONIC 10/20/2009   Acute cystitis 08/17/2009   ALLERGIC RHINITIS 11/21/2007   Allergy    ASYMPTOMATIC POSTMENOPAUSAL STATUS 09/29/2008   Bariatric surgery status 07/07/2010   Bariatric surgery status 07/07/2010   Qualifier: Diagnosis of  By: Everardo All MD, Sean A    [facility])    uterine   Cataract    Coronary artery disease    DEPRESSION 11/21/2007   DIABETES MELLITUS, TYPE II 07/08/2007   DYSPNEA 05/20/2009   Edema 05/20/2009   Eosinophilic esophagitis 02/13/2008  FEVER UNSPECIFIED 10/20/2009   FEVER, HX OF 03/09/2010   GERD (gastroesophageal reflux disease)    Headache(784.0) 02/06/2010   Hepatomegaly 01/06/2008   HYPERLIPIDEMIA 07/08/2007   HYPERTENSION 07/08/2007   Kidney disease    followed by nephrology, per patient   LEUKOPENIA, MILD 09/29/2008   Lymphedema    left leg, dx approximately in 03/2023 per patient   Melanoma in situ of left upper arm (HCC) 04/03/2023   OBESITY 01/06/2008   OSTEOARTHRITIS 01/06/2008   Other chronic nonalcoholic liver disease 01/06/2008   PERIPHERAL NEUROPATHY 11/21/2007   Peripheral neuropathy    Postsurgical hypothyroidism 09/19/2010   SINUSITIS- ACUTE-NOS 11/21/2007   TB SKIN TEST, POSITIVE 02/06/2010   THYROID NODULE 03/09/2010    Past Surgical History:  Procedure Laterality Date   ABDOMINAL HYSTERECTOMY     BASAL CELL CARCINOMA EXCISION     CARDIAC CATHETERIZATION     CHOLECYSTECTOMY N/A 05/11/2021   Procedure: LAPAROSCOPIC  CHOLECYSTECTOMY WITH POSSIBLE  INTRAOPERATIVE CHOLANGIOGRAM;  Surgeon: Luretha Murphy, MD;  Location: [facility];  Service: General;  Laterality: N/A;   COLONOSCOPY     CORONARY ATHERECTOMY N/A 05/03/2022   Procedure: CORONARY ATHERECTOMY;  Surgeon: Orbie Pyo, MD;  Location: [facility];  Service: Cardiovascular;  Laterality: N/A;   CORONARY IMAGING/OCT N/A 05/03/2022   Procedure: INTRAVASCULAR IMAGING/OCT;  Surgeon: Orbie Pyo, MD;  Location: [facility];  Service: Cardiovascular;  Laterality: N/A;   CORONARY PRESSURE/FFR STUDY N/A 05/03/2022   Procedure: INTRAVASCULAR PRESSURE WIRE/FFR STUDY;  Surgeon: Orbie Pyo, MD;  Location: [facility];  Service: Cardiovascular;  Laterality: N/A;   CORONARY STENT INTERVENTION N/A 05/03/2022   Procedure: CORONARY STENT INTERVENTION;  Surgeon: Orbie Pyo, MD;  Location: [facility];  Service: Cardiovascular;  Laterality: N/A;  lad   EYE SURGERY Bilateral 08/2018, 09/2018   FOOT SURGERY Left    10/2019   KNEE ARTHROSCOPY Left    LEFT HEART CATH AND CORONARY ANGIOGRAPHY N/A 01/20/2019   Procedure: LEFT HEART CATH AND CORONARY ANGIOGRAPHY;  Surgeon: Lyn Records, MD;  Location: [facility];  Service: Cardiovascular;  Laterality: N/A;   LYMPH NODE DISSECTION     OOPHORECTOMY     Right total knee replacement     RIGHT/LEFT HEART CATH AND CORONARY ANGIOGRAPHY N/A 05/03/2022   Procedure: RIGHT/LEFT HEART CATH AND CORONARY ANGIOGRAPHY;  Surgeon: Orbie Pyo, MD;  Location: [facility];  Service: Cardiovascular;  Laterality: N/A;   ROUX-EN-Y GASTRIC BYPASS  2011   SKIN TAG REMOVAL     THYROIDECTOMY     TONSILLECTOMY AND ADENOIDECTOMY     TOOTH EXTRACTION  2023   UPPER GASTROINTESTINAL ENDOSCOPY     UPPER GI ENDOSCOPY  2024    Family History  Problem Relation Age of Onset   Diabetes Mother    Neuropathy Mother    Diabetes Father    Congestive Heart Failure Father    Hypertension  Father    Myelodysplastic syndrome Father    Bladder Cancer Sister    Multiple sclerosis Sister    Breast cancer Sister        breast onset age 40   Osteoporosis Sister    Liver cancer Maternal Aunt    Breast cancer Maternal Aunt    Diabetes Maternal Aunt    Colon cancer Maternal Uncle    Diabetes Maternal Uncle    Neuropathy Paternal Aunt    Diabetes Maternal Grandmother    Diabetes Paternal  Grandmother     Current Medications, Allergies, Family History and Social History were reviewed in [employer] record.     Current Outpatient Medications  Medication Sig Dispense Refill   acetaminophen (TYLENOL) 500 MG tablet Take 2 tablets (1,000 mg total) by mouth every 6 (six) hours as needed for mild pain or fever. 30 tablet 0   atorvastatin (LIPITOR) 40 MG tablet Take 1 tablet (40 mg total) by mouth daily. 90 tablet 3   benzonatate (TESSALON) 200 MG capsule Take 1 capsule (200 mg total) by mouth 2 (two) times daily as needed for cough. 20 capsule 0   blood glucose meter kit and supplies KIT Use up to three  times daily as directed. 1 each 0   Blood Glucose Monitoring Suppl DEVI 1 each by Does not apply route in the morning, at noon, and at bedtime. May substitute to any manufacturer covered by patient's insurance. 1 each 0   Blood Glucose Monitoring Suppl DEVI 1 each by Does not apply route in the morning, at noon, and at bedtime. May substitute to any manufacturer covered by patient's insurance. 1 each 0   canagliflozin (INVOKANA) 300 MG TABS tablet Take 1 tablet (300 mg total) by mouth daily before breakfast. 30 tablet 5   cetirizine (ZYRTEC) 10 MG tablet TAKE 1 TABLET BY MOUTH EVERY DAY 90 tablet 4   Cholecalciferol (VITAMIN D) 50 MCG (2000 UT) tablet Take 4,000 Units by mouth in the morning.     clopidogrel (PLAVIX) 75 MG tablet Take 1 tablet (75 mg total) by mouth daily. 90 tablet 3   clotrimazole (MYCELEX) 10 MG troche Take 1 tablet (10 mg total) by mouth 5  (five) times daily. 35 Troche 0   Colchicine (MITIGARE) 0.6 MG CAPS Take by mouth as needed.     Continuous Glucose Sensor (DEXCOM G7 SENSOR) MISC USE AS DIRECTED 9 each 0   cyclobenzaprine (FLEXERIL) 10 MG tablet Take 10 mg by mouth at bedtime as needed for muscle spasms.     diclofenac sodium (VOLTAREN) 1 % GEL Apply 4 g topically 4 (four) times daily. 100 g 11   Dulaglutide (TRULICITY) 4.5 MG/0.5ML SOPN Inject 4.5 mg as directed once a week. 6 mL 3   fluticasone (FLONASE) 50 MCG/ACT nasal spray SPRAY 2 SPRAYS INTO EACH NOSTRIL EVERY DAY 48 mL 3   Glucagon (BAQSIMI ONE PACK) 3 MG/DOSE POWD Place 1 Device into the nose as needed (Low blood sugar with impaired consciousness). 2 each 3   Glucose Blood (BLOOD GLUCOSE TEST STRIPS) STRP 1 each by In Vitro route in the morning, at noon, and at bedtime. May substitute to any manufacturer covered by patient's insurance. 100 each 3   Halobetasol & Lactic Acid (ULTRAVATE X, OINTMENT, EX) Apply 1 Application topically 2 (two) times daily.     HYDROcodone-acetaminophen (NORCO/VICODIN) 5-325 MG tablet Take 1 tablet by mouth as needed for moderate pain (pain score 4-6).     insulin glargine (LANTUS SOLOSTAR) 100 UNIT/ML Solostar Pen Inject 10 Units into the skin daily. 15 mL 1   Insulin Pen Needle (B-D UF III MINI PEN NEEDLES) 31G X 5 MM MISC 1 Needle by Does not apply route daily at 6 (six) AM. Use once daily as directed with Lantus 90 each 0   ipratropium (ATROVENT) 0.06 % nasal spray Place 1 spray into both nostrils as needed.     Lancet Device MISC 1 each by Does not apply route in the morning, at noon, and  at bedtime. May substitute to any manufacturer covered by patient's insurance. 1 each 0   Lancets Misc. MISC 1 each by Does not apply route in the morning, at noon, and at bedtime. May substitute to any manufacturer covered by patient's insurance. 100 each 3   levalbuterol (XOPENEX HFA) 45 MCG/ACT inhaler Inhale 1 puff into the lungs every 4 (four) hours  as needed for wheezing. 1 each 12   levocetirizine (XYZAL) 5 MG tablet Take 1 tablet (5 mg total) by mouth every evening. 90 tablet 1   levothyroxine (SYNTHROID) 100 MCG tablet Take 1 tablet (100 mcg total) by mouth daily. 90 tablet 3   lidocaine (LIDODERM) 5 % PLACE 1 PATCH ONTO THE SKIN DAILY. REMOVE & DISCARD PATCH WITHIN 12 HOURS OR AS DIRECTED BY MD 30 patch 0   MAGNESIUM PO Take 3 tablets by mouth daily as needed (foot cramps).     methocarbamol (ROBAXIN) 750 MG tablet Take 750 mg by mouth every 6 (six) hours as needed.     Multiple Vitamin (MULTIVITAMIN) tablet Take 1 tablet by mouth in the morning and at bedtime.     ONETOUCH ULTRA test strip USE AS INSTRUCTED 100 strip 2   pantoprazole (PROTONIX) 40 MG tablet TAKE 1 TABLET BY MOUTH TWICE A DAY 180 tablet 1   repaglinide (PRANDIN) 2 MG tablet Take 1 tablet (2 mg total) by mouth 3 (three) times daily before meals. 270 tablet 3   traMADol (ULTRAM) 50 MG tablet Take 50 mg by mouth every 6 (six) hours as needed (pain).     fluconazole (DIFLUCAN) 200 MG tablet Take 200 mg by mouth once a week.     nitroGLYCERIN (NITROSTAT) 0.4 MG SL tablet Place 1 tablet (0.4 mg total) under the tongue every 5 (five) minutes as needed. (Patient not taking: Reported on 10/30/2023) 25 tablet 3   phenazopyridine (PYRIDIUM) 100 MG tablet Take 1 tablet (100 mg total) by mouth 3 (three) times daily with meals. (Patient not taking: Reported on 10/30/2023) 9 tablet 0   No current facility-administered medications for this visit.    Review of Systems: No chest pain. No shortness of breath. No urinary complaints.    Physical Exam  Filed Weights   10/30/23 1455  Weight: 183 lb 2 oz (83.1 kg)   Wt Readings from Last 3 Encounters:  10/30/23 183 lb 2 oz (83.1 kg)  10/29/23 183 lb (83 kg)  10/17/23 174 lb (78.9 kg)    BP 128/70 (BP Location: Left Arm, Patient Position: Sitting, Cuff Size: Normal)   Pulse 68   Ht 5\' 10"  (1.778 m)   Wt 183 lb 2 oz (83.1 kg)    BMI 26.28 kg/m  Constitutional:  Pleasant, generally well appearing female in no acute distress. Psychiatric: Normal mood and affect. Behavior is normal. EENT: Pupils normal.  Conjunctivae are normal. No scleral icterus. Neck supple.  Cardiovascular: Normal rate, regular rhythm.  Pulmonary/chest: Effort normal and breath sounds normal. No wheezing, rales or rhonchi. Abdominal: Soft, nondistended, nontender. Bowel sounds active throughout. There are no masses palpable. No hepatomegaly. Neurological: Alert and oriented to person place and time.    Willette Cluster, NP  10/30/2023, 3:08 PM

## 2023-10-31 ENCOUNTER — Other Ambulatory Visit: Payer: Self-pay

## 2023-10-31 ENCOUNTER — Telehealth: Payer: Self-pay | Admitting: Family Medicine

## 2023-10-31 NOTE — Telephone Encounter (Signed)
No form received as of 10/31/2023

## 2023-10-31 NOTE — Telephone Encounter (Signed)
Copied from CRM [id number]. Topic: Medical Record Request - Provider/Facility Request >> Oct 31, 2023  2:08 PM Corin V wrote: Reason for CRM: Junie Panning with [facility] called to see if a lab requisition form had been received. It was sent on 12/10 and 12/13. He will fax it again today. Please complete and fax back to him at [phone].

## 2023-11-04 DIAGNOSIS — Z820 Family history of epilepsy and other diseases of the nervous system: Secondary | ICD-10-CM | POA: Diagnosis not present

## 2023-11-04 DIAGNOSIS — D8481 Immunodeficiency due to conditions classified elsewhere: Secondary | ICD-10-CM | POA: Diagnosis not present

## 2023-11-04 DIAGNOSIS — L900 Lichen sclerosus et atrophicus: Secondary | ICD-10-CM | POA: Diagnosis not present

## 2023-11-12 NOTE — Patient Instructions (Signed)
Make sure all meals have protein, 2-3 servings of carbohydrate and a very small amount of fat.  Review list of foods in each of the above categories and call if questions Find time to walk each day for 20-30 minutes 5 days/week. TAke insulin every day.  Call if morning blood sugars are still over 130.

## 2023-11-14 ENCOUNTER — Telehealth: Payer: Self-pay | Admitting: *Deleted

## 2023-11-14 NOTE — Telephone Encounter (Signed)
 Copied from CRM [id number]. Topic: Clinical - Medical Advice >> Nov 14, 2023  2:14 PM Alfonso ORN wrote: Reason for CRM: patient have a recurring Uti with pain and burning and request if can bring in a urine sample or get some antibiotics  , started Sunday    Patient need ov for evaluation and treatment  Loula Marcella,RMA

## 2023-11-20 ENCOUNTER — Encounter: Payer: Self-pay | Admitting: Internal Medicine

## 2023-11-22 ENCOUNTER — Ambulatory Visit: Payer: Self-pay | Admitting: Family Medicine

## 2023-11-22 ENCOUNTER — Ambulatory Visit (INDEPENDENT_AMBULATORY_CARE_PROVIDER_SITE_OTHER): Payer: Medicare Other | Admitting: Family Medicine

## 2023-11-22 VITALS — BP 118/60 | HR 74 | Temp 97.8°F | Ht 70.0 in | Wt 192.2 lb

## 2023-11-22 DIAGNOSIS — N1832 Chronic kidney disease, stage 3b: Secondary | ICD-10-CM

## 2023-11-22 DIAGNOSIS — E1151 Type 2 diabetes mellitus with diabetic peripheral angiopathy without gangrene: Secondary | ICD-10-CM | POA: Diagnosis not present

## 2023-11-22 DIAGNOSIS — Z794 Long term (current) use of insulin: Secondary | ICD-10-CM

## 2023-11-22 DIAGNOSIS — N3001 Acute cystitis with hematuria: Secondary | ICD-10-CM | POA: Diagnosis not present

## 2023-11-22 DIAGNOSIS — R300 Dysuria: Secondary | ICD-10-CM | POA: Diagnosis not present

## 2023-11-22 LAB — POC URINALSYSI DIPSTICK (AUTOMATED)
Bilirubin, UA: NEGATIVE
Blood, UA: POSITIVE
Glucose, UA: POSITIVE — AB
Ketones, UA: NEGATIVE
Nitrite, UA: NEGATIVE
Protein, UA: POSITIVE — AB
Spec Grav, UA: 1.015 (ref 1.010–1.025)
Urobilinogen, UA: 0.2 U/dL
pH, UA: 6 (ref 5.0–8.0)

## 2023-11-22 MED ORDER — AMOXICILLIN-POT CLAVULANATE 875-125 MG PO TABS: 1.0000 | ORAL_TABLET | Freq: Two times a day (BID) | ORAL | 0 refills | Status: DC

## 2023-11-22 NOTE — Telephone Encounter (Signed)
 Copied from CRM [id number]. Topic: Clinical - Red Word Triage >> Nov 22, 2023 12:16 PM Franky GRADE wrote: Red Word that prompted transfer to Nurse Triage: Patient has been dealing with a UTI on and off and she is out of medication that can help her treat. She is in a lot of discomfort and feels like symptoms are getting worse.  Chief Complaint: uti symptoms Symptoms: burning with urination and burning after Frequency: started 2 weeks ago and did try AZO Pertinent Negatives: Patient denies fever and flank pain Disposition: [] ED /[] Urgent Care (no appt availability in office) / [x] Appointment(In office/virtual)/ []  [place] [facility]/ [] Home Care/ [] Refused Recommended Disposition /[] [facility]/ []  Follow-up with PCP Additional Notes: patient c/o uti symptoms, states she gets them often.  States burning and burning with urination.  Apt made for this afternoon.  Instructed to go to Er if becomes worse.  Care advice reviewed, denies questions.   Reason for Disposition  [1] SEVERE pain with urination (e.g., excruciating) AND [2] not improved after 2 hours of pain medicine and Sitz bath  Answer Assessment - Initial Assessment Questions 1. SEVERITY: How bad is the pain?  (e.g., Scale 1-10; mild, moderate, or severe)   - MILD (1-3): complains slightly about urination hurting   - MODERATE (4-7): interferes with normal activities     - SEVERE (8-10): excruciating, unwilling or unable to urinate because of the pain      3/10 2. FREQUENCY: How many times have you had painful urination today?      frequent 3. PATTERN: Is pain present every time you urinate or just sometimes?      Burning every time and after 4. ONSET: When did the painful urination start?      2 weeks ago on Sunday 5. FEVER: Do you have a fever? If Yes, ask: What is your temperature, how was it measured, and when did it start?     denies 6. PAST UTI: Have you had a urine infection before? If Yes, ask: When  was the last time? and What happened that time?      yes 7. CAUSE: What do you think is causing the painful urination?  (e.g., UTI, scratch, Herpes sore)     UTI 8. OTHER SYMPTOMS: Do you have any other symptoms? (e.g., blood in urine, flank pain, genital sores, urgency, vaginal discharge)     Blood in urine  Protocols used: Urination Pain - Female-A-AH

## 2023-11-22 NOTE — Patient Instructions (Addendum)
 UA concerning for UTI. Likely UTI. Will get culture. Empiric treatment with: Augmentin  though we reviewed together chance of resistance for this or keflex  or nitrofurantoin - we will really have to wait on culture Patient to follow up if new or worsening symptoms or failure to improve. I should have her results back by Monday or tuesday  Recommended follow up: Return for as needed for new, worsening, persistent symptoms. -if you had fever or drastic worsening of symptoms please seek care over weekend

## 2023-11-22 NOTE — Progress Notes (Signed)
 Phone [phone] In person visit   Subjective:   Savannah [name] is a 68 y.o. year old very pleasant female patient who presents for/with See problem oriented charting Chief Complaint  Patient presents with   burning with urination    Pt c/o continued UTI, she just finished augmentin  this past Tuesday.    Past Medical History-  Patient Active Problem List   Diagnosis Date Noted   Bilateral carpal tunnel syndrome 03/13/2022   Leg edema 03/01/2022   Nondisplaced comminuted fracture of left patella, initial encounter for closed fracture 08/01/2021   Arthritis of carpometacarpal [facility]) joint of right thumb 07/28/2021   Primary osteoarthritis of right distal radioulnar joint 07/28/2021   Arthritis of wrist, right, degenerative 07/28/2021   History of malignant neoplasm of uterine body 07/27/2021   Osteopenia 01/14/2021   Sesamoiditis of left foot 11/09/2020   Posterior tibial tendinitis of left lower extremity 11/09/2020   Type 2 diabetes mellitus with diabetic peripheral angiopathy without gangrene, with long-term current use of insulin  (HCC) 10/21/2020   Iron  deficiency anemia 03/14/2020   Lesion of vulva 09/02/2019   Menorrhagia 09/02/2019   Neuropathy 09/02/2019   Irregular bowel habits 06/25/2019   Coronary artery disease involving native coronary artery of native heart without angina pectoris    Temporomandibular joint disorder 12/05/2018   Fatigue 12/05/2018   Inverted nipple 12/05/2018   Senile purpura (HCC) 11/21/2018   GERD with esophagitis 11/21/2018   Pseudogout involving multiple joints 11/21/2018   Lipoma 07/22/2017   Lichen sclerosus 11/16/2016   H/O gastric bypass 11/16/2016   Depression 11/16/2016   Diabetic polyneuropathy associated with type 2 diabetes mellitus (HCC) 11/16/2016   Uterine cancer (HCC) s/p hysterectomy 1997 01/04/2012   Postsurgical hypothyroidism 09/19/2010   Diabetic neuropathy (HCC) 11/21/2007   Allergic rhinitis 11/21/2007   Dyslipidemia  associated with type 2 diabetes mellitus (HCC) 07/08/2007    Medications- reviewed and updated Current Outpatient Medications  Medication Sig Dispense Refill   acetaminophen  (TYLENOL ) 500 MG tablet Take 2 tablets (1,000 mg total) by mouth every 6 (six) hours as needed for mild pain or fever. 30 tablet 0   amoxicillin -clavulanate (AUGMENTIN ) 875-125 MG tablet Take 1 tablet by mouth 2 (two) times daily for 5 days. 10 tablet 0   atorvastatin  (LIPITOR) 40 MG tablet Take 1 tablet (40 mg total) by mouth daily. 90 tablet 3   benzonatate  (TESSALON ) 200 MG capsule Take 1 capsule (200 mg total) by mouth 2 (two) times daily as needed for cough. 20 capsule 0   blood glucose meter kit and supplies KIT Use up to three  times daily as directed. 1 each 0   Blood Glucose Monitoring Suppl DEVI 1 each by Does not apply route in the morning, at noon, and at bedtime. May substitute to any manufacturer covered by patient's insurance. 1 each 0   Blood Glucose Monitoring Suppl DEVI 1 each by Does not apply route in the morning, at noon, and at bedtime. May substitute to any manufacturer covered by patient's insurance. 1 each 0   cetirizine  (ZYRTEC ) 10 MG tablet TAKE 1 TABLET BY MOUTH EVERY DAY 90 tablet 4   Cholecalciferol (VITAMIN D ) 50 MCG (2000 UT) tablet Take 4,000 Units by mouth in the morning.     clopidogrel  (PLAVIX ) 75 MG tablet Take 1 tablet (75 mg total) by mouth daily. 90 tablet 3   clotrimazole  (MYCELEX ) 10 MG troche Take 1 tablet (10 mg total) by mouth 5 (five) times daily. 35 Troche 0  Colchicine  (MITIGARE ) 0.6 MG CAPS Take by mouth as needed.     Continuous Glucose Sensor (DEXCOM G7 SENSOR) MISC USE AS DIRECTED 9 each 0   cyclobenzaprine  (FLEXERIL ) 10 MG tablet Take 10 mg by mouth at bedtime as needed for muscle spasms.     diclofenac  sodium (VOLTAREN ) 1 % GEL Apply 4 g topically 4 (four) times daily. 100 g 11   Dulaglutide  (TRULICITY ) 4.5 MG/0.5ML SOPN Inject 4.5 mg as directed once a week. 6 mL 3    fluconazole  (DIFLUCAN ) 200 MG tablet Take 200 mg by mouth once a week.     fluticasone  (FLONASE ) 50 MCG/ACT nasal spray SPRAY 2 SPRAYS INTO EACH NOSTRIL EVERY DAY 48 mL 3   Glucagon  (BAQSIMI  ONE PACK) 3 MG/DOSE POWD Place 1 Device into the nose as needed (Low blood sugar with impaired consciousness). 2 each 3   Glucose Blood (BLOOD GLUCOSE TEST STRIPS) STRP 1 each by In Vitro route in the morning, at noon, and at bedtime. May substitute to any manufacturer covered by patient's insurance. 100 each 3   Halobetasol & Lactic Acid (ULTRAVATE X, OINTMENT, EX) Apply 1 Application topically 2 (two) times daily.     insulin  glargine (LANTUS  SOLOSTAR) 100 UNIT/ML Solostar Pen Inject 10 Units into the skin daily. 15 mL 1   Insulin  Pen Needle (B-D UF III MINI PEN NEEDLES) 31G X 5 MM MISC 1 Needle by Does not apply route daily at 6 (six) AM. Use once daily as directed with Lantus  90 each 0   ipratropium (ATROVENT) 0.06 % nasal spray Place 1 spray into both nostrils as needed.     Lancet Device MISC 1 each by Does not apply route in the morning, at noon, and at bedtime. May substitute to any manufacturer covered by patient's insurance. 1 each 0   Lancets Misc. MISC 1 each by Does not apply route in the morning, at noon, and at bedtime. May substitute to any manufacturer covered by patient's insurance. 100 each 3   levalbuterol  (XOPENEX  HFA) 45 MCG/ACT inhaler Inhale 1 puff into the lungs every 4 (four) hours as needed for wheezing. 1 each 12   levocetirizine (XYZAL ) 5 MG tablet Take 1 tablet (5 mg total) by mouth every evening. 90 tablet 1   levothyroxine  (SYNTHROID ) 100 MCG tablet Take 1 tablet (100 mcg total) by mouth daily. 90 tablet 3   lidocaine  (LIDODERM ) 5 % PLACE 1 PATCH ONTO THE SKIN DAILY. REMOVE & DISCARD PATCH WITHIN 12 HOURS OR AS DIRECTED BY MD 30 patch 0   MAGNESIUM PO Take 3 tablets by mouth daily as needed (foot cramps).     methocarbamol (ROBAXIN) 750 MG tablet Take 750 mg by mouth every 6 (six)  hours as needed.     Multiple Vitamin (MULTIVITAMIN) tablet Take 1 tablet by mouth in the morning and at bedtime.     nitroGLYCERIN  (NITROSTAT ) 0.4 MG SL tablet Place 1 tablet (0.4 mg total) under the tongue every 5 (five) minutes as needed. 25 tablet 3   ONETOUCH ULTRA test strip USE AS INSTRUCTED 100 strip 2   pantoprazole  (PROTONIX ) 40 MG tablet TAKE 1 TABLET BY MOUTH TWICE A DAY 180 tablet 1   phenazopyridine  (PYRIDIUM ) 100 MG tablet Take 1 tablet (100 mg total) by mouth 3 (three) times daily with meals. 9 tablet 0   repaglinide  (PRANDIN ) 2 MG tablet Take 1 tablet (2 mg total) by mouth 3 (three) times daily before meals. 270 tablet 3   traMADol  (ULTRAM ) 50  MG tablet Take 50 mg by mouth every 6 (six) hours as needed (pain).     HYDROcodone -acetaminophen  (NORCO/VICODIN) 5-325 MG tablet Take 1 tablet by mouth as needed for moderate pain (pain score 4-6). (Patient not taking: Reported on 11/22/2023)     No current facility-administered medications for this visit.     Objective:  BP 118/60   Pulse 74   Temp 97.8 F (36.6 C)   Ht 5' 10 (1.778 m)   Wt 192 lb 3.2 oz (87.2 kg)   SpO2 99%   BMI 27.58 kg/m  Gen: NAD, resting comfortably CV: RRR no murmurs rubs or gallops Lungs: CTAB no crackles, wheeze, rhonchi Abdomen: soft/nontender/nondistended/normal bowel sounds. No rebound or guarding.  Ext: 1+ edema Skin: warm, dry  Results for orders placed or performed in visit on 11/22/23 (from the past 24 hours)  POCT Urinalysis Dipstick (Automated)     Status: Abnormal   Collection Time: 11/22/23  1:44 PM  Result Value Ref Range   Color, UA yellow    Clarity, UA cloudy    Glucose, UA Positive (A) Negative   Bilirubin, UA neg    Ketones, UA neg    Spec Grav, UA 1.015 1.010 - 1.025   Blood, UA positive    pH, UA 6.0 5.0 - 8.0   Protein, UA Positive (A) Negative   Urobilinogen, UA 0.2 0.2 or 1.0 E.U./dL   Nitrite, UA neg    Leukocytes, UA Moderate (2+) (A) Negative   *Note: Due to a  large number of results and/or encounters for the requested time period, some results have not been displayed. A complete set of results can be found in Results Review.       Assessment and Plan    #Concern for UTI S: Patients symptoms started this morning further worsening- started though about 2 weeks ago and she has been on and off some old Augmentin  she had. -patient called in 11/14/23 with concern for urinary tract infection- declined appointment and she noted plan was to go to Emergency Department- I do not see Emergency Department note- she reports today though that she did not go to Emergency Department - she had some old Augmentin - took 3 and then another 3 a few days later and then one more set of 3 then ran out about 2-3 days ago.    Complains of dysuria: yes and some lower abdominal pain; polyuria: some increase from baseline (stopped invokana  2 months ago- stopped due to urinary tract infection and lichen sclerosis) ; nocturia: 1-2x a night stable; urgency: not so far.   - previously in November had been seen in urgent care in [place] [facility] and had UA concerning for UTI  with positive nitrite and large leukocytes and trace blood and urine culture showed <10,000 colonies of klebsiella pneumonia that was resistant to ampicillin and nitrofurantoin . She returned home and was seen again on 09/26/23- interestingly that culture also showed klebsiella at slightly higher amount 10,000- 49,000 colonies with resistance to ampicillin and nitrofurantoin . She was started on Augmentin  after culture results came back- thinks she only did 3-4 days instead of 5 days potentially- not 100% sure. Did ok from November until early january  ROS- no fever, chills, nausea, vomiting, flank pain. No blood in urine.  A/P: UA concerning for UTI. Likely UTI. Will get culture. Empiric treatment with: Augmentin  though we reviewed together chance of resistance for this or keflex  or nitrofurantoin - we will really  have to wait on culture.  Would  advise against self-medicating in the future especially the on and off antibiotic choice and also discussed how this may have increased resistance-we will not know until we have the culture Patient to follow up if new or worsening symptoms or failure to improve. I should have her results back by Monday or Tuesday -Does have hematuria-1 follows up with PCP could consider repeat urinalysis to make sure this is cleared but suspect related to infection  # CKD stage III- checked the dosing of Augmentin  versus her renal function.  Most recent GFR in our system was just under 30.  Reviewed media with recent note from [facility] with nephrology noted 10/01/23.  With renal function improved to stage III range-okay to give full dose of Augmentin   # Diabetes-poorly controlled and this is likely contributing to recurrent UTIs.  She is on Prandin  2 mg 3 times a day, Trulicity  4.5 mg weekly and reports recently started on Lantus -hoping for improvement in her diabetes control which may also reduce her risk of UTIs-continue current medication for now Lab Results  Component Value Date   HGBA1C 8.9 (A) 10/17/2023   HGBA1C 7.1 (H) 02/22/2023   HGBA1C 7.5 (A) 10/15/2022   Recommended follow up: Return for as needed for new, worsening, persistent symptoms. Future Appointments  Date Time Provider Department Center  11/27/2023  4:00 PM Loni Soyla LABOR, MD [facility] None  12/05/2023  8:00 AM [facility] END[facility]/NEURO LAB [facility] None  12/12/2023  1:00 PM Dartha Ernst, MD [facility] None  12/19/2023  3:30 PM Knox Leita CROME, RD [facility] NDM  01/01/2024  1:00 PM Okey Burns, MD [facility] None  06/04/2024  1:20 PM Dolphus Reiter, MD [facility] None    Lab/Order associations:   ICD-10-CM   1. Acute cystitis with hematuria  N30.01     2. Type 2 diabetes mellitus with diabetic peripheral angiopathy without gangrene, with long-term current use of insulin  (HCC)  E11.51     Z79.4     3. Stage 3b chronic kidney disease (HCC)  N18.32     4. Burning with urination  R30.0 POCT Urinalysis Dipstick (Automated)    Urine Culture    CANCELED: Urine Culture      Meds ordered this encounter  Medications   amoxicillin -clavulanate (AUGMENTIN ) 875-125 MG tablet    Sig: Take 1 tablet by mouth 2 (two) times daily for 5 days.    Dispense:  10 tablet    Refill:  0    Return precautions advised.  Garnette Lukes, MD

## 2023-11-25 ENCOUNTER — Other Ambulatory Visit: Payer: Self-pay | Admitting: Family Medicine

## 2023-11-25 LAB — URINE CULTURE
MICRO NUMBER:: 15943323
SPECIMEN QUALITY:: ADEQUATE

## 2023-11-25 MED ORDER — NITROFURANTOIN MONOHYD MACRO 100 MG PO CAPS: 100.0000 mg | ORAL_CAPSULE | Freq: Two times a day (BID) | ORAL | 0 refills | Status: AC

## 2023-11-27 ENCOUNTER — Encounter: Payer: Self-pay | Admitting: Internal Medicine

## 2023-11-27 ENCOUNTER — Ambulatory Visit: Payer: Medicare Other | Attending: Internal Medicine | Admitting: Internal Medicine

## 2023-11-27 VITALS — BP 140/80 | HR 67 | Ht 71.0 in | Wt 183.0 lb

## 2023-11-27 DIAGNOSIS — D692 Other nonthrombocytopenic purpura: Secondary | ICD-10-CM

## 2023-11-27 DIAGNOSIS — Z79899 Other long term (current) drug therapy: Secondary | ICD-10-CM | POA: Diagnosis not present

## 2023-11-27 DIAGNOSIS — E785 Hyperlipidemia, unspecified: Secondary | ICD-10-CM | POA: Diagnosis not present

## 2023-11-27 DIAGNOSIS — Z955 Presence of coronary angioplasty implant and graft: Secondary | ICD-10-CM | POA: Diagnosis not present

## 2023-11-27 DIAGNOSIS — I2510 Atherosclerotic heart disease of native coronary artery without angina pectoris: Secondary | ICD-10-CM

## 2023-11-27 DIAGNOSIS — I5032 Chronic diastolic (congestive) heart failure: Secondary | ICD-10-CM

## 2023-11-27 MED ORDER — NITROGLYCERIN 0.4 MG SL SUBL: 0.4000 mg | SUBLINGUAL_TABLET | SUBLINGUAL | 3 refills | Status: DC | PRN

## 2023-11-27 NOTE — Progress Notes (Signed)
Cardiology Office Note:  .   Date:  11/27/2023  ID:  Savannah [name], DOB 05/14/1956, MRN [id number] PCP: Ardith Dark, MD  [facility] Providers Cardiologist:  Parke Poisson, MD    History of Present Illness: Savannah [name]   Savannah [name] is a 68 y.o. female.  Discussed the use of AI scribe software for clinical note transcription with the patient, who gave verbal consent to proceed.  History of Present Illness   Occasional chest pain, which she describes as pressure, is more pronounced when lying on her back. The patient is unsure of the origin of the pain, suggesting it may be esophageal or skeletal. She has a history of coronary artery disease, for which a stent was placed in 2023. The patient also reports having a mildly regurgitant mitral valve, as revealed in an echocardiogram in 2022.  In addition to the chest pain, the patient has been experiencing episodes of urinary tract infections (UTIs), which have been difficult to manage. She recently completed a course of Augmentin, but does not feel that the infection has fully resolved. The patient also reports occasional bruising, which she attributes to her use of Plavix.  She has been off Lasix for her lymphedema, as it was believed to be worsening her condition. The patient's diabetes is being managed by endocrinologist.        ROS: negative except per HPI above.  Studies Reviewed: Savannah [name]   EKG Interpretation Date/Time:  Wednesday November 27 2023 16:45:34 EST Ventricular Rate:  67 PR Interval:  132 QRS Duration:  84 QT Interval:  412 QTC Calculation: 435 R Axis:   -2  Text Interpretation: Normal sinus rhythm Normal ECG When compared with ECG of 17-May-2023 08:58, No significant change was found Confirmed by Weston Brass ([id number]) on 11/27/2023 5:08:45 PM    Results   LABS LDL: 53 (02/2023)  DIAGNOSTIC Echocardiogram: Normal systolic function, normal diastolic function, normal contractility, normal pressures, mild mitral  valve regurgitation (2022) Cardiac catheterization: One stent placed, high-grade proximal LAD stenosis, diffusely diseased left anterior descending artery treated with medication (2023) EKG: Normal (11/27/2023)     Risk Assessment/Calculations:        Physical Exam:   VS:  BP (!) 140/80 (BP Location: Left Arm, Patient Position: Sitting, Cuff Size: Normal)   Pulse 67   Ht 5\' 11"  (1.803 m)   Wt 183 lb (83 kg) Comment: Weighed at doctor late last week.  SpO2 99%   BMI 25.52 kg/m    Wt Readings from Last 3 Encounters:  11/27/23 183 lb (83 kg)  11/22/23 192 lb 3.2 oz (87.2 kg)  10/30/23 183 lb 2 oz (83.1 kg)     Physical Exam   VITALS: BP- 114/? CHEST: Lung sounds clear. CARDIOVASCULAR: Heart sounds normal.     GEN: Well nourished, well developed in no acute distress NECK: No JVD; No carotid bruits CARDIAC: RRR, no murmurs, rubs, gallops RESPIRATORY:  Clear to auscultation without rales, wheezing or rhonchi  ABDOMEN: Soft, non-tender, non-distended EXTREMITIES:  No edema; No deformity   ASSESSMENT AND PLAN: .    Assessment & Plan Coronary artery disease involving native coronary artery of native heart without angina pectoris  Hyperlipidemia LDL goal <70  S/P coronary artery stent placement  Chronic diastolic CHF (congestive heart failure) (HCC)  Medication management   Assessment and Plan    Coronary Artery Disease (CAD) Stable chest pain, possibly esophageal or musculoskeletal in nature. No change in frequency or intensity since last stent  placement in 2023. No nitroglycerin use. -Continue current medications including Atorvastatin 40mg  daily and Plavix. -Refill nitroglycerin for PRN use. -Order an echocardiogram in 6 months to reassess mitral valve regurgitation and overall cardiac function.  Hypertension Elevated blood pressure at today's visit, possibly due to recent dietary sodium intake. Typically has low blood pressure. -Monitor blood pressure and adjust  diet as needed to avoid high sodium foods.  Follow-up in 6 months after echocardiogram.

## 2023-11-27 NOTE — Patient Instructions (Signed)
 Medication Instructions:  Your physician recommends that you continue on your current medications as directed. Please refer to the Current Medication list given to you today.  *If you need a refill on your cardiac medications before your next appointment, please call your pharmacy*  Lab Work: None  Testing/Procedures: Your physician has requested that you have an echocardiogram in about six months. Echocardiography is a painless test that uses sound waves to create images of your heart. It provides your doctor with information about the size and shape of your heart and how well your heart's chambers and valves are working. This procedure takes approximately one hour. There are no restrictions for this procedure. Please do NOT wear cologne, perfume, aftershave, or lotions (deodorant is allowed). Please arrive 15 minutes prior to your appointment time. This will take place at [address] [place]   Follow-Up: At [facility], you and your health needs are our priority.  As part of our continuing mission to provide you with exceptional heart care, we have created designated Provider Care Teams.  These Care Teams include your primary Cardiologist (physician) and Advanced Practice Providers (APPs -  Physician Assistants and Nurse Practitioners) who all work together to provide you with the care you need, when you need it.   Your next appointment:   6 month(s) (after your Echocardiogram)  Provider:   Gayatri A Acharya, MD

## 2023-12-02 DIAGNOSIS — M791 Myalgia, unspecified site: Secondary | ICD-10-CM | POA: Diagnosis not present

## 2023-12-02 DIAGNOSIS — M47812 Spondylosis without myelopathy or radiculopathy, cervical region: Secondary | ICD-10-CM | POA: Diagnosis not present

## 2023-12-02 DIAGNOSIS — M47816 Spondylosis without myelopathy or radiculopathy, lumbar region: Secondary | ICD-10-CM | POA: Diagnosis not present

## 2023-12-02 DIAGNOSIS — G894 Chronic pain syndrome: Secondary | ICD-10-CM | POA: Diagnosis not present

## 2023-12-02 DIAGNOSIS — Z79891 Long term (current) use of opiate analgesic: Secondary | ICD-10-CM | POA: Diagnosis not present

## 2023-12-05 ENCOUNTER — Other Ambulatory Visit: Payer: Medicare Other

## 2023-12-07 ENCOUNTER — Other Ambulatory Visit: Payer: Self-pay | Admitting: Family Medicine

## 2023-12-12 ENCOUNTER — Ambulatory Visit: Payer: Medicare Other | Admitting: "Endocrinology

## 2023-12-19 ENCOUNTER — Ambulatory Visit: Payer: Medicare Other | Admitting: Dietician

## 2023-12-27 DIAGNOSIS — I872 Venous insufficiency (chronic) (peripheral): Secondary | ICD-10-CM | POA: Diagnosis not present

## 2023-12-27 DIAGNOSIS — L900 Lichen sclerosus et atrophicus: Secondary | ICD-10-CM | POA: Diagnosis not present

## 2024-01-01 ENCOUNTER — Ambulatory Visit (INDEPENDENT_AMBULATORY_CARE_PROVIDER_SITE_OTHER): Payer: Medicare Other | Admitting: Otolaryngology

## 2024-01-02 ENCOUNTER — Other Ambulatory Visit: Payer: Medicare Other

## 2024-01-03 ENCOUNTER — Encounter: Payer: Self-pay | Admitting: Internal Medicine

## 2024-01-08 ENCOUNTER — Ambulatory Visit: Payer: Medicare Other | Admitting: "Endocrinology

## 2024-01-27 ENCOUNTER — Encounter: Payer: Self-pay | Admitting: Dietician

## 2024-01-27 ENCOUNTER — Encounter: Payer: Medicare Other | Attending: "Endocrinology | Admitting: Dietician

## 2024-01-27 DIAGNOSIS — E1151 Type 2 diabetes mellitus with diabetic peripheral angiopathy without gangrene: Secondary | ICD-10-CM | POA: Insufficient documentation

## 2024-01-27 DIAGNOSIS — Z794 Long term (current) use of insulin: Secondary | ICD-10-CM | POA: Insufficient documentation

## 2024-01-27 NOTE — Progress Notes (Signed)
 Diabetes Self-Management Education  Visit Type: First/Initial  Appt. Start Time: 1405 Appt. End Time: 1510  01/27/2024  Savannah [name], identified by name and date of birth, is a 68 y.o. female with a diagnosis of Diabetes: Type 2.   ASSESSMENT Patient is here today alone.  She was last seen by another diabetes educator in the office on 10/23/2023.   At that visit, she admitted to having an eating disorder, hoarding food, and can not stop eating sweets.  She states that she has gone to [employer] in the past and is considering returning.  Today she states all she wants is sugar, sweets, bread, popcorn.  Occasional egg sandwich. She states that she knows how to eat but does not eat well.  She has been through DSME classes around 2000. She is not wearing her Dexcom as the glue made her skin irritated.  Article provided on Sensor adhesion issues from Dexcom provided.    She states depression after breakup with a boyfriend and has a difficulty getting out of the house. She would like to walk but feet and legs hurt.  Enjoys water classes when the weather is warm. Always is cold and keeps her heat at 74-76 degrees. Send message to MD to see if they can put in a prescription for diabetes shoes as patient has a hard time getting shoes to fit her properly. She is receptive to counseling.  List of counselors provided to include those that help with eating disorders and are covered by Medicare. She is not cooking.  List of meal delivery or meal prep options provided.   History includes:  Type 2 Diabetes (late 90's), neuropathy, R&Y Bariatric surgery (2011), no thyroid, CKD, HTN, HLD, history of uterine cancer, history of skin cancer Medication includes:  Trulicity, repaglinide, Lantus 15 random times of the day and will forget at times (invokana and metformin discontinued due to her kidney function), magnesium, MVI, Vitamin D, colace Labs noted to include:  A1C 8.9% 10/17/2023 increased  from 7.1% 02/22/2023, eGFR 37 06/17/2023 She is seeing a nephrologist  Dexcom G7 - not currently using    71" 183 lbs  149 lbs lowest adult weight in a while and did not feel well with this.  Patient lives alone. She is retired from [employer]. Unable to walk a lot do to  Does not like water classes in the winter. Caffeine  and chocolate results in pvc's so avoids. Eats increased amounts of eggs.   Diabetes Self-Management Education - 01/27/24 1445       Visit Information   Visit Type First/Initial      Initial Visit   Diabetes Type Type 2    Date Diagnosed 1998    Are you currently following a meal plan? No    Are you taking your medications as prescribed? No      Health Coping   How would you rate your overall health? Fair      Psychosocial Assessment   Patient Belief/Attitude about Diabetes Defeat/Burnout    What is the hardest part about your diabetes right now, causing you the most concern, or is the most worrisome to you about your diabetes?   Making healty food and beverage choices    Self-care barriers Other (comment)   eating disorder   Self-management support Doctor's office    Other persons present Patient    Patient Concerns Nutrition/Meal planning;Problem Solving;Glycemic Control    Special Needs None    Preferred Learning Style  No preference indicated    Learning Readiness Ready    How often do you need to have someone help you when you read instructions, pamphlets, or other written materials from your doctor or pharmacy? 1 - Never    What is the last grade level you completed in school? BA      Pre-Education Assessment   Patient understands the diabetes disease and treatment process. Comprehends key points    Patient understands incorporating nutritional management into lifestyle. Needs Review    Patient undertands incorporating physical activity into lifestyle. Needs Review    Patient understands using medications safely. Needs Review     Patient understands monitoring blood glucose, interpreting and using results Needs Review    Patient understands prevention, detection, and treatment of acute complications. Needs Review    Patient understands prevention, detection, and treatment of chronic complications. Needs Review    Patient understands how to develop strategies to address psychosocial issues. Needs Review    Patient understands how to develop strategies to promote health/change behavior. Needs Review      Complications   Last HgB A1C per patient/outside source 8.9 %   10/17/2023 increased from 7.1% 02/22/2023   How often do you check your blood sugar? --   checks her blood glucose once per week   Have you had a dilated eye exam in the past 12 months? Yes    Have you had a dental exam in the past 12 months? Yes    Are you checking your feet? Yes    How many days per week are you checking your feet? 4      Dietary Intake   Snack (morning) regular hot chocolate and almond milk    Lunch Vegetable samosa's    Snack (afternoon) popcon    Dinner broccoli soup, crackers OR  Asian food (egg drop soup, spring roll, beef and veges, rice)    Snack (evening) 1/2 apple, vanilla wafers    Beverage(s) water, hot chocolate made with almond milk, occasional diet root beer, selzer water      Activity / Exercise   Activity / Exercise Type ADL's      Patient Education   Previous Diabetes Education Yes (please comment)   ~2000   Healthy Eating Meal options for control of blood glucose level and chronic complications.;Plate Method    Being Active Role of exercise on diabetes management, blood pressure control and cardiac health.;Helped patient identify appropriate exercises in relation to his/her diabetes, diabetes complications and other health issue.    Medications Reviewed patients medication for diabetes, action, purpose, timing of dose and side effects.    Monitoring Taught/evaluated CGM (comment)    Chronic complications  Relationship between chronic complications and blood glucose control;Identified and discussed with patient  current chronic complications    Diabetes Stress and Support Identified and addressed patients feelings and concerns about diabetes;Worked with patient to identify barriers to care and solutions;Brainstormed with patient on coping mechanisms for social situations, getting support from significant others, dealing with feelings about diabetes    Lifestyle and Health Coping Lifestyle issues that need to be addressed for better diabetes care      Individualized Goals (developed by patient)   Nutrition General guidelines for healthy choices and portions discussed    Physical Activity Exercise 3-5 times per week;30 minutes per day    Medications take my medication as prescribed    Monitoring  Consistenly use CGM    Problem Solving Eating Pattern;Addressing barriers to behavior  change    Reducing Risk examine blood glucose patterns;treat hypoglycemia with 15 grams of carbs if blood glucose less than 70mg /dL;do foot checks daily    Health Coping Ask for help with psychological, social, or emotional issues;Discuss barriers to diabetes care with support person/system (comment specifics as needed)      Post-Education Assessment   Patient understands the diabetes disease and treatment process. Demonstrates understanding / competency    Patient understands incorporating nutritional management into lifestyle. Comprehends key points    Patient undertands incorporating physical activity into lifestyle. Comprehends key points    Patient understands using medications safely. Comphrehends key points    Patient understands monitoring blood glucose, interpreting and using results Comprehends key points    Patient understands prevention, detection, and treatment of acute complications. Comprehends key points    Patient understands prevention, detection, and treatment of chronic complications. Comprehends key  points    Patient understands how to develop strategies to address psychosocial issues. Needs Review    Patient understands how to develop strategies to promote health/change behavior. Needs Review      Outcomes   Expected Outcomes Demonstrated interest in learning but significant barriers to change    Future DMSE PRN    Program Status Completed             Individualized Plan for Diabetes Self-Management Training:   Learning Objective:  Patient will have a greater understanding of diabetes self-management. Patient education plan is to attend individual and/or group sessions per assessed needs and concerns.   Plan:   Patient Instructions  Keep your Lantus pen by your synthroid and take them to together.  Consider signing up for water aerobics See the list for barrier options for the Dexcom. Use a water proof patch on your Dexcom   Self care - balanced meals, sleep hygeine  Fruits & Vegetables: Aim to fill half your plate with a variety of fruits and vegetables. They are rich in vitamins, minerals, and fiber, and can help reduce the risk of chronic diseases. Choose a colorful assortment of fruits and vegetables to ensure you get a wide range of nutrients. Grains and Starches: Make at least half of your grain choices whole grains, such as brown rice, whole wheat bread, and oats. Whole grains provide fiber, which aids in digestion and healthy cholesterol levels. Aim for whole forms of starchy vegetables such as potatoes, sweet potatoes, beans, peas, and corn, which are fiber rich and provide many vitamins and minerals.  Protein: Incorporate lean sources of protein, such as poultry, fish, beans, nuts, and seeds, into your meals. Protein is essential for building and repairing tissues, staying full, balancing blood sugar, as well as supporting immune function. Dairy: Include low-fat or fat-free dairy products like milk, yogurt, and cheese in your diet. Dairy foods are excellent sources  of calcium and vitamin D, which are crucial for bone health.  Physical Activity: Aim for 30 minutes of physical activity daily. Regular physical activity promotes overall health-including helping to reduce risk for heart disease and diabetes, promoting mental health, and helping [place] sleep better.   Meal Delivery options:  (frozen, preprepared, or kits) [employer]  (advertises to see if you qualify for insurance coverage of the meals) [employer]  ([phone] (25% off with cod RD25 and pop up for 50% off) Whole [email or web address] (Whole foods plant based) Hungry Root Purple Carrot (vegan) Other companies can be found on line Leaf[employer] (vegan) - does not require refrigeration so great for travel and backpacking  [employer]ourished[employer] - easy menu personalization, no prep, no cooking Meal prep store by [employer]    Expected Outcomes:  Demonstrated interest in learning but significant barriers to change  Education material provided: My Plate and Snack sheet, NKD national kidney diet - Dish up a Kidney-Friendly Meal for Patients with Chronic Kidney Disease (not on dialysis)  If problems or questions, patient to contact team via:  Phone  Future DSME appointment: PRN

## 2024-01-27 NOTE — Patient Instructions (Addendum)
 Keep your Lantus pen by your synthroid and take them to together.  Consider signing up for water aerobics See the list for barrier options for the Dexcom. Use a water proof patch on your Dexcom   Self care - balanced meals, sleep hygeine  Fruits & Vegetables: Aim to fill half your plate with a variety of fruits and vegetables. They are rich in vitamins, minerals, and fiber, and can help reduce the risk of chronic diseases. Choose a colorful assortment of fruits and vegetables to ensure you get a wide range of nutrients. Grains and Starches: Make at least half of your grain choices whole grains, such as brown rice, whole wheat bread, and oats. Whole grains provide fiber, which aids in digestion and healthy cholesterol levels. Aim for whole forms of starchy vegetables such as potatoes, sweet potatoes, beans, peas, and corn, which are fiber rich and provide many vitamins and minerals.  Protein: Incorporate lean sources of protein, such as poultry, fish, beans, nuts, and seeds, into your meals. Protein is essential for building and repairing tissues, staying full, balancing blood sugar, as well as supporting immune function. Dairy: Include low-fat or fat-free dairy products like milk, yogurt, and cheese in your diet. Dairy foods are excellent sources of calcium and vitamin D, which are crucial for bone health.  Physical Activity: Aim for 30 minutes of physical activity daily. Regular physical activity promotes overall health-including helping to reduce risk for heart disease and diabetes, promoting mental health, and helping [place] sleep better.   Meal Delivery options:  (frozen, preprepared, or kits) [employer]  (advertises to see if you qualify for insurance coverage of the meals) [employer]com  [phone] (25% off with cod RD25 and pop up for 50% off) Whole [email or web address] (Whole foods plant based) Hungry Root Purple Carrot (vegan) Other companies can be found on line [employer] (vegan)  - does not require refrigeration so great for travel and backpacking [employer]ourishedrx.com [employer] - easy menu personalization, no prep, no cooking Meal prep store by [employer]

## 2024-02-11 ENCOUNTER — Other Ambulatory Visit: Payer: Self-pay

## 2024-02-11 DIAGNOSIS — E1165 Type 2 diabetes mellitus with hyperglycemia: Secondary | ICD-10-CM

## 2024-02-11 DIAGNOSIS — L900 Lichen sclerosus et atrophicus: Secondary | ICD-10-CM | POA: Diagnosis not present

## 2024-02-11 DIAGNOSIS — Z8542 Personal history of malignant neoplasm of other parts of uterus: Secondary | ICD-10-CM | POA: Diagnosis not present

## 2024-02-11 DIAGNOSIS — Z01419 Encounter for gynecological examination (general) (routine) without abnormal findings: Secondary | ICD-10-CM | POA: Diagnosis not present

## 2024-02-11 DIAGNOSIS — Z1231 Encounter for screening mammogram for malignant neoplasm of breast: Secondary | ICD-10-CM | POA: Diagnosis not present

## 2024-02-13 ENCOUNTER — Other Ambulatory Visit: Payer: Self-pay | Admitting: *Deleted

## 2024-02-13 DIAGNOSIS — J309 Allergic rhinitis, unspecified: Secondary | ICD-10-CM

## 2024-02-13 MED ORDER — REPAGLINIDE 2 MG PO TABS: 2.0000 mg | ORAL_TABLET | Freq: Three times a day (TID) | ORAL | 3 refills | Status: DC

## 2024-02-13 MED ORDER — LEVOCETIRIZINE DIHYDROCHLORIDE 5 MG PO TABS: 5.0000 mg | ORAL_TABLET | Freq: Every evening | ORAL | 1 refills | Status: DC

## 2024-02-13 MED ORDER — LEVOTHYROXINE SODIUM 100 MCG PO TABS
100.0000 ug | ORAL_TABLET | Freq: Every day | ORAL | 3 refills | Status: DC
Start: 2024-02-13 — End: 2024-02-27

## 2024-02-17 ENCOUNTER — Other Ambulatory Visit: Payer: Self-pay

## 2024-02-17 MED ORDER — CLOPIDOGREL BISULFATE 75 MG PO TABS: 75.0000 mg | ORAL_TABLET | Freq: Every day | ORAL | 2 refills | Status: DC

## 2024-02-20 ENCOUNTER — Other Ambulatory Visit

## 2024-02-20 ENCOUNTER — Other Ambulatory Visit: Payer: Self-pay | Admitting: "Endocrinology

## 2024-02-20 DIAGNOSIS — E1165 Type 2 diabetes mellitus with hyperglycemia: Secondary | ICD-10-CM | POA: Diagnosis not present

## 2024-02-21 LAB — LIPID PANEL
Cholesterol: 120 mg/dL (ref <200)
HDL: 44 mg/dL — ABNORMAL LOW (ref >=50)
LDL Cholesterol (Calc): 58 mg/dL
Non-HDL Cholesterol (Calc): 76 mg/dL (ref <130)
Total CHOL/HDL Ratio: 2.7 (calc) (ref <5.0)
Triglycerides: 94 mg/dL (ref <150)

## 2024-02-21 LAB — REFLEX TIQ

## 2024-02-21 LAB — TSH(REFL): TSH: 24.06 m[IU]/L — ABNORMAL HIGH (ref 0.40–4.50)

## 2024-02-23 ENCOUNTER — Encounter: Payer: Self-pay | Admitting: Family Medicine

## 2024-02-24 ENCOUNTER — Other Ambulatory Visit: Payer: Self-pay | Admitting: *Deleted

## 2024-02-24 ENCOUNTER — Telehealth: Payer: Self-pay

## 2024-02-24 MED ORDER — PANTOPRAZOLE SODIUM 40 MG PO TBEC: 40.0000 mg | DELAYED_RELEASE_TABLET | Freq: Two times a day (BID) | ORAL | 0 refills | Status: DC

## 2024-02-24 NOTE — Telephone Encounter (Signed)
 Pt called office regarding concerns about results of her TSH levels, please advise.

## 2024-02-24 NOTE — Telephone Encounter (Signed)
 Spoke to Savannah [name], informed Savannah [name] that Dr Vertell Gory would discuss her test results during upcoming visit.

## 2024-02-25 DIAGNOSIS — G894 Chronic pain syndrome: Secondary | ICD-10-CM | POA: Diagnosis not present

## 2024-02-25 DIAGNOSIS — M47816 Spondylosis without myelopathy or radiculopathy, lumbar region: Secondary | ICD-10-CM | POA: Diagnosis not present

## 2024-02-25 DIAGNOSIS — M47812 Spondylosis without myelopathy or radiculopathy, cervical region: Secondary | ICD-10-CM | POA: Diagnosis not present

## 2024-02-25 DIAGNOSIS — M791 Myalgia, unspecified site: Secondary | ICD-10-CM | POA: Diagnosis not present

## 2024-02-26 ENCOUNTER — Other Ambulatory Visit: Payer: Self-pay | Admitting: *Deleted

## 2024-02-27 ENCOUNTER — Encounter: Payer: Self-pay | Admitting: "Endocrinology

## 2024-02-27 ENCOUNTER — Ambulatory Visit (INDEPENDENT_AMBULATORY_CARE_PROVIDER_SITE_OTHER): Admitting: "Endocrinology

## 2024-02-27 ENCOUNTER — Encounter: Payer: Self-pay | Admitting: Internal Medicine

## 2024-02-27 VITALS — BP 140/80 | HR 82 | Wt 182.0 lb

## 2024-02-27 DIAGNOSIS — E1165 Type 2 diabetes mellitus with hyperglycemia: Secondary | ICD-10-CM | POA: Diagnosis not present

## 2024-02-27 DIAGNOSIS — Z794 Long term (current) use of insulin: Secondary | ICD-10-CM

## 2024-02-27 DIAGNOSIS — Z7984 Long term (current) use of oral hypoglycemic drugs: Secondary | ICD-10-CM | POA: Diagnosis not present

## 2024-02-27 DIAGNOSIS — E890 Postprocedural hypothyroidism: Secondary | ICD-10-CM

## 2024-02-27 DIAGNOSIS — E7800 Pure hypercholesterolemia, unspecified: Secondary | ICD-10-CM | POA: Diagnosis not present

## 2024-02-27 DIAGNOSIS — Z7985 Long-term (current) use of injectable non-insulin antidiabetic drugs: Secondary | ICD-10-CM | POA: Diagnosis not present

## 2024-02-27 MED ORDER — INSULIN LISPRO (1 UNIT DIAL) 100 UNIT/ML (KWIKPEN): 5.0000 [IU] | PEN_INJECTOR | Freq: Three times a day (TID) | SUBCUTANEOUS | 1 refills | Status: DC

## 2024-02-27 MED ORDER — LEVOTHYROXINE SODIUM 112 MCG PO TABS: 112.0000 ug | ORAL_TABLET | Freq: Every day | ORAL | 1 refills | Status: DC

## 2024-02-27 NOTE — Progress Notes (Signed)
 Outpatient Endocrinology Note Savannah Ball, MD  02/27/24   Savannah [name] 03-Nov-1956 [id number]  Referring Provider: Ardith Dark, MD Primary Care Provider: Ardith Dark, MD Reason for consultation: Subjective   Assessment & Plan  Diagnoses and all orders for this visit:  Postoperative hypothyroidism -     TSH -     T4, free  Uncontrolled type 2 diabetes mellitus with hyperglycemia (HCC)  Long term (current) use of oral hypoglycemic drugs  Long-term (current) use of injectable non-insulin antidiabetic drugs  Long-term insulin use (HCC)  Pure hypercholesterolemia  Other orders -     levothyroxine (SYNTHROID) 112 MCG tablet; Take 1 tablet (112 mcg total) by mouth daily. -     insulin lispro (HUMALOG KWIKPEN) 100 UNIT/ML KwikPen; Inject 5 Units into the skin 3 (three) times daily.   History of thyroidectomy on levothyroxine po every day, takes with other meds Recommend to take levothyroxine first thing in the morning on empty stomach and wait at least 30 minutes to 1 hour before eating or drinking anything or taking any other medications. Space out levothyroxine by 4 hours from any acid reflux medication, fibrate, iron, calcium, multivitamin, and nutritional supplements.  02/27/24: start LT4 112 mcg po qd, take as above Repeat lab before next visit   Diabetes Type II complicated by heart stents, neuropathy, nephropathy  She took insulin from 2004-2011, when she had gastric bypass surgery Lab Results  Component Value Date   GFR 28.90 (L) 04/03/2023   Hba1c goal less than 7, current Hba1c is  Lab Results  Component Value Date   HGBA1C 8.9 (A) 10/17/2023   Will recommend the following: Lantus 18 units qam  Start Humalog 5 units tidac 15 min before meals +-1 units for BG>150 or BG <70 respectively  Trulicity 4.5 mg weekly Stop Repaglinide 2 mg tid - not helping   Stopped Invokana 300 mg every day-gets UTIs and has pelvic lichen sclerosis   Has  DexCom Ordered DM education previously   No known contraindications/side effects to any of above medications No history of MEN syndrome/medullary thyroid cancer/pancreatitis or pancreatic cancer in self or family Glucagon discussed and prescribed with refills on 02/27/24  -Last LD and Tg are as follows: Lab Results  Component Value Date   LDLCALC 58 02/20/2024    Lab Results  Component Value Date   TRIG 94 02/20/2024   -On atorvastatin 40 mg QD -Follow low fat diet and exercise   -Blood pressure goal <140/90 - Microalbumin/creatinine goal is < 30 -Last MA/Cr is as follows: Lab Results  Component Value Date   MICROALBUR 0.7 02/22/2023   -not on ACE/ARB, sees a nephrologist  -diet changes including salt restriction -limit eating outside -counseled BP targets per standards of diabetes care -uncontrolled blood pressure can lead to retinopathy, nephropathy and cardiovascular and atherosclerotic heart disease  Reviewed and counseled on: -A1C target -Blood sugar targets -Complications of uncontrolled diabetes  -Checking blood sugar before meals and bedtime and bring log next visit -All medications with mechanism of action and side effects -Hypoglycemia management: rule of 15's, Glucagon Emergency Kit and medical alert ID -low-carb low-fat plate-method diet -At least 20 minutes of physical activity per day -Annual dilated retinal eye exam and foot exam -compliance and follow up needs -follow up as scheduled or earlier if problem gets worse  Call if blood sugar is less than 70 or consistently above 250    Take a 15 gm snack of carbohydrate at bedtime  before you go to sleep if your blood sugar is less than 100.    If you are going to fast after midnight for a test or procedure, ask your physician for instructions on how to reduce/decrease your insulin dose.    Call if blood sugar is less than 70 or consistently above 250  -Treating a low sugar by rule of 15  (15 gms of  sugar every 15 min until sugar is more than 70) If you feel your sugar is low, test your sugar to be sure If your sugar is low (less than 70), then take 15 grams of a fast acting Carbohydrate (3-4 glucose tablets or glucose gel or 4 ounces of juice or regular soda) Recheck your sugar 15 min after treating low to make sure it is more than 70 If sugar is still less than 70, treat again with 15 grams of carbohydrate          Don't drive the hour of hypoglycemia  If unconscious/unable to eat or drink by mouth, use glucagon injection or nasal spray baqsimi and call 911. Can repeat again in 15 min if still unconscious.  Return in about 6 weeks (around 04/09/2024) for visit + labs before next visit.   I have reviewed current medications, nurse's notes, allergies, vital signs, past medical and surgical history, family medical history, and social history for this encounter. Counseled patient on symptoms, examination findings, lab findings, imaging results, treatment decisions and monitoring and prognosis. The patient understood the recommendations and agrees with the treatment plan. All questions regarding treatment plan were fully answered.  Savannah Alma, MD  02/27/24    History of Present Illness Savannah [name] is a 68 y.o. year old female who presents for evaluation of Type II diabetes mellitus and hypothyroidism.  C/o tiredness, dry skin   Savannah [name] was first diagnosed in 2000.   Diabetes education +  Home diabetes regimen: Lantus 15 units qam  Trulicity 4.5 mg weekly Repaglinide 2 mg tid   COMPLICATIONS - MI/Stroke, + angina s/p heart stents  -  retinopathy +  neuropathy +  nephropathy  BLOOD SUGAR DATA  CGM interpretation: At today's visit, we reviewed her CGM downloads. The full report is scanned in the media. Reviewing the CGM trends, BG are  elevated almost across the day.   Physical Exam  BP (!) 140/80   Pulse 82   Wt 182 lb (82.6 kg)   SpO2 98%   BMI 25.38 kg/m     Constitutional: well developed, well nourished Head: normocephalic, atraumatic Eyes: sclera anicteric, no redness Neck: supple Lungs: normal respiratory effort Neurology: alert and oriented Skin: dry, no appreciable rashes Musculoskeletal: no appreciable defects Psychiatric: normal mood and affect Diabetic Foot Exam - Simple   No data filed      Current Medications Patient's Medications  New Prescriptions   INSULIN LISPRO (HUMALOG KWIKPEN) 100 UNIT/ML KWIKPEN    Inject 5 Units into the skin 3 (three) times daily.   LEVOTHYROXINE (SYNTHROID) 112 MCG TABLET    Take 1 tablet (112 mcg total) by mouth daily.  Previous Medications   ACETAMINOPHEN (TYLENOL) 500 MG TABLET    Take 2 tablets (1,000 mg total) by mouth every 6 (six) hours as needed for mild pain or fever.   ATORVASTATIN (LIPITOR) 40 MG TABLET    Take 1 tablet (40 mg total) by mouth daily.   BENZONATATE (TESSALON) 200 MG CAPSULE    Take 1 capsule (200 mg total) by mouth  2 (two) times daily as needed for cough.   BLOOD GLUCOSE METER KIT AND SUPPLIES KIT    Use up to three  times daily as directed.   BLOOD GLUCOSE MONITORING SUPPL DEVI    1 each by Does not apply route in the morning, at noon, and at bedtime. May substitute to any manufacturer covered by patient's insurance.   BLOOD GLUCOSE MONITORING SUPPL DEVI    1 each by Does not apply route in the morning, at noon, and at bedtime. May substitute to any manufacturer covered by patient's insurance.   CETIRIZINE (ZYRTEC) 10 MG TABLET    TAKE 1 TABLET BY MOUTH EVERY DAY   CHOLECALCIFEROL (VITAMIN D) 50 MCG (2000 UT) TABLET    Take 4,000 Units by mouth in the morning.   CLOPIDOGREL (PLAVIX) 75 MG TABLET    Take 1 tablet (75 mg total) by mouth daily.   CLOTRIMAZOLE (MYCELEX) 10 MG TROCHE    Take 1 tablet (10 mg total) by mouth 5 (five) times daily.   COLCHICINE (MITIGARE) 0.6 MG CAPS    Take by mouth as needed.   CONTINUOUS GLUCOSE SENSOR (DEXCOM G7 SENSOR) MISC    USE AS DIRECTED    CYCLOBENZAPRINE (FLEXERIL) 10 MG TABLET    Take 10 mg by mouth at bedtime as needed for muscle spasms.   DICLOFENAC SODIUM (VOLTAREN) 1 % GEL    Apply 4 g topically 4 (four) times daily.   DOCUSATE SODIUM (COLACE) 100 MG CAPSULE    Take 100 mg by mouth 2 (two) times daily.   DULAGLUTIDE (TRULICITY) 4.5 MG/0.5ML SOAJ    INJECT 4.5 MG AS DIRECTED ONCE A WEEK.   FLUCONAZOLE (DIFLUCAN) 200 MG TABLET    Take 200 mg by mouth once a week.   FLUOROMETHOLONE (FML) 0.1 % OPHTHALMIC SUSPENSION    Place 1 drop into both eyes as needed.   FLUTICASONE (FLONASE) 50 MCG/ACT NASAL SPRAY    SPRAY 2 SPRAYS INTO EACH NOSTRIL EVERY DAY   GABAPENTIN (NEURONTIN) 300 MG CAPSULE    Take 300 mg by mouth 3 (three) times daily.   GLUCAGON (BAQSIMI ONE PACK) 3 MG/DOSE POWD    Place 1 Device into the nose as needed (Low blood sugar with impaired consciousness).   GLUCOSE BLOOD (BLOOD GLUCOSE TEST STRIPS) STRP    1 each by In Vitro route in the morning, at noon, and at bedtime. May substitute to any manufacturer covered by patient's insurance.   HALOBETASOL & LACTIC ACID (ULTRAVATE X, OINTMENT, EX)    Apply 1 Application topically 2 (two) times daily.   HYDROCODONE-ACETAMINOPHEN (NORCO/VICODIN) 5-325 MG TABLET    Take 1 tablet by mouth as needed for moderate pain (pain score 4-6).   INSULIN GLARGINE (LANTUS SOLOSTAR) 100 UNIT/ML SOLOSTAR PEN    Inject 10 Units into the skin daily.   INSULIN PEN NEEDLE (B-D UF III MINI PEN NEEDLES) 31G X 5 MM MISC    USE AS DIRECTED ONCE DAILY AT 6:00AM AS DIRECTED WITH LANTUS   IPRATROPIUM (ATROVENT) 0.06 % NASAL SPRAY    Place 1 spray into both nostrils as needed.   LEVALBUTEROL (XOPENEX HFA) 45 MCG/ACT INHALER    Inhale 1 puff into the lungs every 4 (four) hours as needed for wheezing.   LEVOCETIRIZINE (XYZAL) 5 MG TABLET    Take 1 tablet (5 mg total) by mouth every evening.   LIDOCAINE (LIDODERM) 5 %    PLACE 1 PATCH ONTO THE SKIN DAILY. REMOVE & DISCARD [facility]  WITHIN 12 HOURS OR AS DIRECTED  BY MD   MAGNESIUM PO    Take 3 tablets by mouth daily as needed (foot cramps).   METHOCARBAMOL (ROBAXIN) 750 MG TABLET    Take 750 mg by mouth every 6 (six) hours as needed.   MULTIPLE VITAMIN (MULTIVITAMIN) TABLET    Take 1 tablet by mouth in the morning and at bedtime.   NITROGLYCERIN (NITROSTAT) 0.4 MG SL TABLET    Place 1 tablet (0.4 mg total) under the tongue every 5 (five) minutes as needed for chest pain.   ONETOUCH ULTRA TEST STRIP    USE AS INSTRUCTED   PANTOPRAZOLE (PROTONIX) 40 MG TABLET    Take 1 tablet (40 mg total) by mouth 2 (two) times daily.   PHENAZOPYRIDINE (PYRIDIUM) 100 MG TABLET    Take 1 tablet (100 mg total) by mouth 3 (three) times daily with meals.   REPAGLINIDE (PRANDIN) 2 MG TABLET    Take 1 tablet (2 mg total) by mouth 3 (three) times daily before meals.   TRAMADOL (ULTRAM) 50 MG TABLET    Take 50 mg by mouth every 6 (six) hours as needed (pain).  Modified Medications   No medications on file  Discontinued Medications   LEVOTHYROXINE (SYNTHROID) 100 MCG TABLET    Take 1 tablet (100 mcg total) by mouth daily.    Allergies Allergies  Allergen Reactions   Ace Inhibitors Cough   Caffeine     PVCs   Ciprofloxacin Itching   Morphine And Codeine Itching    Pt tolerates medication if given with diphenhydramine   Mucinex [Guaifenesin Er]     PVCs   Nsaids     Gastric Bypass Surgery - unable to take, tolerates ec 81 aspirin    Other     Antihistamine-alkylamine - PVCs tolerates benadryl    Silicone Hives   Tape Hives    Past Medical History Past Medical History:  Diagnosis Date   ABDOMINAL PAIN, CHRONIC 10/20/2009   Acute cystitis 08/17/2009   ALLERGIC RHINITIS 11/21/2007   Allergy    ASYMPTOMATIC POSTMENOPAUSAL STATUS 09/29/2008   Bariatric surgery status 07/07/2010   Bariatric surgery status 07/07/2010   Qualifier: Diagnosis of  By: Everardo All MD, Sean A    Cancer [facility])    uterine   Cataract    Coronary artery disease    DEPRESSION 11/21/2007    DIABETES MELLITUS, TYPE II 07/08/2007   DYSPNEA 05/20/2009   Edema 05/20/2009   Eosinophilic esophagitis 02/13/2008   FEVER UNSPECIFIED 10/20/2009   FEVER, HX OF 03/09/2010   GERD (gastroesophageal reflux disease)    Headache(784.0) 02/06/2010   Hepatomegaly 01/06/2008   HYPERLIPIDEMIA 07/08/2007   HYPERTENSION 07/08/2007   Kidney disease    followed by nephrology, per patient   LEUKOPENIA, MILD 09/29/2008   Lymphedema    left leg, dx approximately in 03/2023 per patient   Melanoma in situ of left upper arm (HCC) 04/03/2023   OBESITY 01/06/2008   OSTEOARTHRITIS 01/06/2008   Other chronic nonalcoholic liver disease 01/06/2008   PERIPHERAL NEUROPATHY 11/21/2007   Peripheral neuropathy    Postsurgical hypothyroidism 09/19/2010   SINUSITIS- ACUTE-NOS 11/21/2007   TB SKIN TEST, POSITIVE 02/06/2010   THYROID NODULE 03/09/2010    Past Surgical History Past Surgical History:  Procedure Laterality Date   ABDOMINAL HYSTERECTOMY     BASAL CELL CARCINOMA EXCISION     CARDIAC CATHETERIZATION     CHOLECYSTECTOMY N/A 05/11/2021   Procedure: LAPAROSCOPIC CHOLECYSTECTOMY WITH POSSIBLE  INTRAOPERATIVE CHOLANGIOGRAM;  Surgeon: Jacolyn Matar, MD;  Location: [facility];  Service: General;  Laterality: N/A;   COLONOSCOPY     CORONARY ATHERECTOMY N/A 05/03/2022   Procedure: CORONARY ATHERECTOMY;  Surgeon: Kyra Phy, MD;  Location: [facility];  Service: Cardiovascular;  Laterality: N/A;   CORONARY IMAGING/OCT N/A 05/03/2022   Procedure: INTRAVASCULAR IMAGING/OCT;  Surgeon: Kyra Phy, MD;  Location: [facility] CV [facility];  Service: Cardiovascular;  Laterality: N/A;   CORONARY PRESSURE/FFR STUDY N/A 05/03/2022   Procedure: INTRAVASCULAR PRESSURE WIRE/FFR STUDY;  Surgeon: Kyra Phy, MD;  Location: [facility];  Service: Cardiovascular;  Laterality: N/A;   CORONARY STENT INTERVENTION N/A 05/03/2022   Procedure: CORONARY STENT INTERVENTION;  Surgeon: Kyra Phy, MD;   Location: [facility];  Service: Cardiovascular;  Laterality: N/A;  lad   EYE SURGERY Bilateral 08/2018, 09/2018   FOOT SURGERY Left    10/2019   KNEE ARTHROSCOPY Left    LEFT HEART CATH AND CORONARY ANGIOGRAPHY N/A 01/20/2019   Procedure: LEFT HEART CATH AND CORONARY ANGIOGRAPHY;  Surgeon: Arty Binning, MD;  Location: [facility];  Service: Cardiovascular;  Laterality: N/A;   LYMPH NODE DISSECTION     OOPHORECTOMY     Right total knee replacement     RIGHT/LEFT HEART CATH AND CORONARY ANGIOGRAPHY N/A 05/03/2022   Procedure: RIGHT/LEFT HEART CATH AND CORONARY ANGIOGRAPHY;  Surgeon: Kyra Phy, MD;  Location: [facility];  Service: Cardiovascular;  Laterality: N/A;   ROUX-EN-Y GASTRIC BYPASS  2011   SKIN TAG REMOVAL     THYROIDECTOMY     TONSILLECTOMY AND ADENOIDECTOMY     TOOTH EXTRACTION  2023   UPPER GASTROINTESTINAL ENDOSCOPY     UPPER GI ENDOSCOPY  2024    Family History family history includes Bladder Cancer in her sister; Breast cancer in her maternal aunt and sister; Colon cancer in her maternal uncle; Congestive Heart Failure in her father; Diabetes in her father, maternal aunt, maternal grandmother, maternal uncle, mother, and paternal grandmother; Hypertension in her father; Liver cancer in her maternal aunt; Multiple sclerosis in her sister; Myelodysplastic syndrome in her father; Neuropathy in her mother and paternal aunt; Osteoporosis in her sister.  Social History Social History   Socioeconomic History   Marital status: Single    Spouse name: Not on file   Number of children: 0   Years of education: 16   Highest education level: Bachelor's degree (e.g., BA, AB, BS)  Occupational History   Occupation: Research scientist (medical)   Occupation: Retired  Tobacco Use   Smoking status: Former    Current packs/day: 0.00    Average packs/day: 1.5 packs/day for 15.0 years (22.5 ttl pk-yrs)    Types: Cigarettes    Start date: 34    Quit date: 1992     Years since quitting: 33.3    Passive exposure: Never   Smokeless tobacco: Never  Vaping Use   Vaping status: Never Used  Substance and Sexual Activity   Alcohol use: Yes    Comment: rarely   Drug use: Never   Sexual activity: Not Currently  Other Topics Concern   Not on file  Social History Narrative   Not on file   Social Drivers of Health   Financial Resource Strain: Low Risk  (11/22/2023)   Overall Financial Resource Strain (CARDIA)    Difficulty of Paying Living Expenses: Not hard at all  Food Insecurity: No Food Insecurity (11/22/2023)   Hunger Vital  Sign    Worried About Programme researcher, broadcasting/film/video in the Last Year: Never true    Ran Out of Food in the Last Year: Never true  Transportation Needs: No Transportation Needs (11/22/2023)   PRAPARE - Administrator, Civil Service (Medical): No    Lack of Transportation (Non-Medical): No  Physical Activity: Unknown (11/22/2023)   Exercise Vital Sign    Days of Exercise per Week: 0 days    Minutes of Exercise per Session: Not on file  Stress: No Stress Concern Present (11/22/2023)   [employer] of Occupational Health - Occupational Stress Questionnaire    Feeling of Stress : Not at all  Social Connections: Moderately Integrated (11/22/2023)   Social Connection and Isolation Panel [NHANES]    Frequency of Communication with Friends and Family: Three times a week    Frequency of Social Gatherings with Friends and Family: Three times a week    Attends Religious Services: More than 4 times per year    Active Member of Clubs or Organizations: Yes    Attends Club or Organization Meetings: More than 4 times per year    Marital Status: Never married  Intimate Partner Violence: Not At Risk (05/19/2020)   Humiliation, Afraid, Rape, and Kick questionnaire    Fear of Current or Ex-Partner: No    Emotionally Abused: No    Physically Abused: No    Sexually Abused: No    Lab Results  Component Value Date   HGBA1C 8.9 (A)  10/17/2023   HGBA1C 7.1 (H) 02/22/2023   HGBA1C 7.5 (A) 10/15/2022   Lab Results  Component Value Date   CHOL 120 02/20/2024   Lab Results  Component Value Date   HDL 44 (L) 02/20/2024   Lab Results  Component Value Date   LDLCALC 58 02/20/2024   Lab Results  Component Value Date   TRIG 94 02/20/2024   Lab Results  Component Value Date   CHOLHDL 2.7 02/20/2024   Lab Results  Component Value Date   CREATININE 1.78 (H) 04/03/2023   Lab Results  Component Value Date   GFR 28.90 (L) 04/03/2023   Lab Results  Component Value Date   MICROALBUR 0.7 02/22/2023      Component Value Date/Time   NA 138 04/03/2023 1631   NA 141 05/01/2022 0943   K 5.0 04/03/2023 1631   CL 107 04/03/2023 1631   CO2 23 04/03/2023 1631   GLUCOSE 171 (H) 04/03/2023 1631   BUN 27 (H) 04/03/2023 1631   BUN 22 05/01/2022 0943   CREATININE 1.78 (H) 04/03/2023 1631   CREATININE 1.51 (H) 02/22/2023 1513   CALCIUM 9.1 04/03/2023 1631   CALCIUM 8.9 01/30/2013 1017   PROT 6.7 04/03/2023 1631   PROT 5.8 (L) 10/09/2021 1214   ALBUMIN 4.1 04/03/2023 1631   ALBUMIN 4.0 10/09/2021 1214   AST 35 04/03/2023 1631   AST 25 02/29/2020 1303   ALT 51 (H) 04/03/2023 1631   ALT 37 02/29/2020 1303   ALKPHOS 105 04/03/2023 1631   BILITOT 0.4 04/03/2023 1631   BILITOT 0.4 10/09/2021 1214   BILITOT 0.4 02/29/2020 1303   GFRNONAA >60 05/13/2021 0645   GFRNONAA 63 08/16/2020 1207   GFRAA 73 08/16/2020 1207      Latest Ref Rng & Units 04/03/2023    4:31 PM 02/22/2023    3:13 PM 11/20/2022    3:15 PM  BMP  Glucose 70 - 99 mg/dL 098  119  147   BUN  6 - 23 mg/dL 27  19  17    Creatinine 0.40 - 1.20 mg/dL 1.61  0.96  0.45   BUN/Creat Ratio 6 - 22 (calc)  13    Sodium 135 - 145 mEq/L 138  139  143   Potassium 3.5 - 5.1 mEq/L 5.0  4.9  4.7   Chloride 96 - 112 mEq/L 107  109  110   CO2 19 - 32 mEq/L 23  24  24    Calcium 8.4 - 10.5 mg/dL 9.1  8.7  8.9        Component Value Date/Time   WBC 5.2 04/03/2023  1631   RBC 3.83 (L) 04/03/2023 1631   HGB 12.1 04/03/2023 1631   HGB 13.6 05/01/2022 0943   HCT 37.9 04/03/2023 1631   HCT 40.5 05/01/2022 0943   PLT 215.0 04/03/2023 1631   PLT 187 05/01/2022 0943   MCV 99.0 04/03/2023 1631   MCV 96 05/01/2022 0943   MCH 31.8 02/22/2023 1513   MCHC 31.9 04/03/2023 1631   RDW 14.6 04/03/2023 1631   RDW 13.0 05/01/2022 0943   LYMPHSABS 1.4 04/03/2023 1631   LYMPHSABS 1.2 05/01/2022 0943   MONOABS 0.4 04/03/2023 1631   EOSABS 0.4 04/03/2023 1631   EOSABS 0.3 05/01/2022 0943   BASOSABS 0.0 04/03/2023 1631   BASOSABS 0.0 05/01/2022 0943     Parts of this note may have been dictated using voice recognition software. There may be variances in spelling and vocabulary which are unintentional. Not all errors are proofread. Please notify the [facility] if any discrepancies are noted or if the meaning of any statement is not clear.

## 2024-03-02 ENCOUNTER — Encounter: Payer: Self-pay | Admitting: "Endocrinology

## 2024-03-03 ENCOUNTER — Other Ambulatory Visit: Payer: Self-pay

## 2024-03-03 DIAGNOSIS — E1165 Type 2 diabetes mellitus with hyperglycemia: Secondary | ICD-10-CM

## 2024-03-03 MED ORDER — LANTUS SOLOSTAR 100 UNIT/ML ~~LOC~~ SOPN: 18.0000 [IU] | PEN_INJECTOR | Freq: Every day | SUBCUTANEOUS | 1 refills | Status: DC

## 2024-03-05 ENCOUNTER — Ambulatory Visit: Admitting: Family Medicine

## 2024-03-05 ENCOUNTER — Ambulatory Visit: Payer: Self-pay

## 2024-03-05 ENCOUNTER — Encounter: Payer: Self-pay | Admitting: Family Medicine

## 2024-03-05 VITALS — BP 126/72 | HR 75 | Temp 97.2°F | Ht 71.0 in | Wt 199.0 lb

## 2024-03-05 DIAGNOSIS — E1165 Type 2 diabetes mellitus with hyperglycemia: Secondary | ICD-10-CM | POA: Diagnosis not present

## 2024-03-05 DIAGNOSIS — D2230 Melanocytic nevi of unspecified part of face: Secondary | ICD-10-CM | POA: Diagnosis not present

## 2024-03-05 DIAGNOSIS — L578 Other skin changes due to chronic exposure to nonionizing radiation: Secondary | ICD-10-CM | POA: Diagnosis not present

## 2024-03-05 DIAGNOSIS — L570 Actinic keratosis: Secondary | ICD-10-CM | POA: Diagnosis not present

## 2024-03-05 DIAGNOSIS — D3617 Benign neoplasm of peripheral nerves and autonomic nervous system of trunk, unspecified: Secondary | ICD-10-CM | POA: Diagnosis not present

## 2024-03-05 DIAGNOSIS — R300 Dysuria: Secondary | ICD-10-CM

## 2024-03-05 DIAGNOSIS — Z85828 Personal history of other malignant neoplasm of skin: Secondary | ICD-10-CM | POA: Diagnosis not present

## 2024-03-05 DIAGNOSIS — N3000 Acute cystitis without hematuria: Secondary | ICD-10-CM

## 2024-03-05 DIAGNOSIS — Z7985 Long-term (current) use of injectable non-insulin antidiabetic drugs: Secondary | ICD-10-CM

## 2024-03-05 DIAGNOSIS — Z86006 Personal history of melanoma in-situ: Secondary | ICD-10-CM | POA: Diagnosis not present

## 2024-03-05 DIAGNOSIS — D2271 Melanocytic nevi of right lower limb, including hip: Secondary | ICD-10-CM | POA: Diagnosis not present

## 2024-03-05 DIAGNOSIS — L821 Other seborrheic keratosis: Secondary | ICD-10-CM | POA: Diagnosis not present

## 2024-03-05 DIAGNOSIS — D485 Neoplasm of uncertain behavior of skin: Secondary | ICD-10-CM | POA: Diagnosis not present

## 2024-03-05 DIAGNOSIS — D2272 Melanocytic nevi of left lower limb, including hip: Secondary | ICD-10-CM | POA: Diagnosis not present

## 2024-03-05 DIAGNOSIS — Z86018 Personal history of other benign neoplasm: Secondary | ICD-10-CM | POA: Diagnosis not present

## 2024-03-05 DIAGNOSIS — D225 Melanocytic nevi of trunk: Secondary | ICD-10-CM | POA: Diagnosis not present

## 2024-03-05 DIAGNOSIS — L900 Lichen sclerosus et atrophicus: Secondary | ICD-10-CM | POA: Diagnosis not present

## 2024-03-05 LAB — POC URINALSYSI DIPSTICK (AUTOMATED)
Bilirubin, UA: NEGATIVE
Blood, UA: POSITIVE
Glucose, UA: NEGATIVE
Ketones, UA: NEGATIVE
Nitrite, UA: POSITIVE
Protein, UA: POSITIVE — AB
Spec Grav, UA: 1.01 (ref 1.010–1.025)
Urobilinogen, UA: 0.2 U/dL
pH, UA: 6.5 (ref 5.0–8.0)

## 2024-03-05 MED ORDER — NITROFURANTOIN MONOHYD MACRO 100 MG PO CAPS: 100.0000 mg | ORAL_CAPSULE | Freq: Two times a day (BID) | ORAL | 0 refills | Status: AC

## 2024-03-05 NOTE — Telephone Encounter (Signed)
 Chief Complaint: Pain w/urination Symptoms: Frequency, urgency, pain 4/10 Frequency: Onset last night Pertinent Negatives: Patient denies other symptoms Disposition: [] ED /[] Urgent Care (no appt availability in office) / [x] Appointment(In office/virtual)/ []  [facility]/ [] Home Care/ [] Refused Recommended Disposition /[] [facility]/ []  Follow-up with PCP Additional Notes: Patient says she's been up every 2 hours, took AZO w/no relief, no fever, frequent UTI's. Advised only availability with PCP tomorrow, she wanted today, scheduled with Dr. Arlene Ben.   Copied from CRM [id number]. Topic: Clinical - Red Word Triage >> Mar 05, 2024  9:04 AM Varney Gentleman wrote: [facility] that prompted transfer to Nurse Triage: UTI again, pain and burning. Reason for Disposition  Age > 50 years  Answer Assessment - Initial Assessment Questions 1. SEVERITY: "How bad is the pain?"  (e.g., Scale 1-10; mild, moderate, or severe)   - MILD (1-3): complains slightly about urination hurting   - MODERATE (4-7): interferes with normal activities     - SEVERE (8-10): excruciating, unwilling or unable to urinate because of the pain      4 2. FREQUENCY: "How many times have you had painful urination today?"      All night every time 3. PATTERN: "Is pain present every time you urinate or just sometimes?"      Everytime 4. ONSET: "When did the painful urination start?"      Yesterday evening took Azo 5. FEVER: "Do you have a fever?" If Yes, ask: "What is your temperature, how was it measured, and when did it start?"     No 6. PAST UTI: "Have you had a urine infection before?" If Yes, ask: "When was the last time?" and "What happened that time?"     Possibly 3 months had UTI 7. CAUSE: "What do you think is causing the painful urination?"  (e.g., UTI, scratch, Herpes sore)     UTI 8. OTHER SYMPTOMS: "Do you have any other symptoms?" (e.g., blood in urine, flank pain, genital sores, urgency, vaginal  discharge)     Frequency, burning with urination, urgency  Protocols used: Urination Pain - Female-A-AH

## 2024-03-05 NOTE — Progress Notes (Signed)
 Phone [phone] In person visit   Subjective:   Savannah [name] is a 68 y.o. year old very pleasant female patient who presents for/with See problem oriented charting Chief Complaint  Patient presents with   burning with urination    Pt c/o burning with urination that started yesterday.    Past Medical History-  Patient Active Problem List   Diagnosis Date Noted   Bilateral carpal tunnel syndrome 03/13/2022   Leg edema 03/01/2022   Nondisplaced comminuted fracture of left patella, initial encounter for closed fracture 08/01/2021   Arthritis of carpometacarpal [facility]) joint of right thumb 07/28/2021   Primary osteoarthritis of right distal radioulnar joint 07/28/2021   Arthritis of wrist, right, degenerative 07/28/2021   History of malignant neoplasm of uterine body 07/27/2021   Osteopenia 01/14/2021   Sesamoiditis of left foot 11/09/2020   Posterior tibial tendinitis of left lower extremity 11/09/2020   Type 2 diabetes mellitus with diabetic peripheral angiopathy without gangrene, with long-term current use of insulin  (HCC) 10/21/2020   Iron  deficiency anemia 03/14/2020   Lesion of vulva 09/02/2019   Menorrhagia 09/02/2019   Neuropathy 09/02/2019   Irregular bowel habits 06/25/2019   Coronary artery disease involving native coronary artery of native heart without angina pectoris    Temporomandibular joint disorder 12/05/2018   Fatigue 12/05/2018   Inverted nipple 12/05/2018   Senile purpura (HCC) 11/21/2018   GERD with esophagitis 11/21/2018   Pseudogout involving multiple joints 11/21/2018   Lipoma 07/22/2017   Lichen sclerosus 11/16/2016   H/O gastric bypass 11/16/2016   Depression 11/16/2016   Diabetic polyneuropathy associated with type 2 diabetes mellitus (HCC) 11/16/2016   Uterine cancer (HCC) s/p hysterectomy 1997 01/04/2012   Postsurgical hypothyroidism 09/19/2010   Diabetic neuropathy (HCC) 11/21/2007   Allergic rhinitis 11/21/2007   Dyslipidemia associated  with type 2 diabetes mellitus (HCC) 07/08/2007    Medications- reviewed and updated Current Outpatient Medications  Medication Sig Dispense Refill   acetaminophen  (TYLENOL ) 500 MG tablet Take 2 tablets (1,000 mg total) by mouth every 6 (six) hours as needed for mild pain or fever. 30 tablet 0   atorvastatin  (LIPITOR) 40 MG tablet Take 1 tablet (40 mg total) by mouth daily. 90 tablet 3   blood glucose meter kit and supplies KIT Use up to three  times daily as directed. 1 each 0   Blood Glucose Monitoring Suppl DEVI 1 each by Does not apply route in the morning, at noon, and at bedtime. May substitute to any manufacturer covered by patient's insurance. 1 each 0   cetirizine  (ZYRTEC ) 10 MG tablet TAKE 1 TABLET BY MOUTH EVERY DAY 90 tablet 4   Cholecalciferol (VITAMIN D ) 50 MCG (2000 UT) tablet Take 4,000 Units by mouth in the morning.     clopidogrel  (PLAVIX ) 75 MG tablet Take 1 tablet (75 mg total) by mouth daily. 90 tablet 2   clotrimazole  (MYCELEX ) 10 MG troche Take 1 tablet (10 mg total) by mouth 5 (five) times daily. 35 Troche 0   Colchicine  (MITIGARE ) 0.6 MG CAPS Take by mouth as needed.     Continuous Glucose Sensor (DEXCOM G7 SENSOR) MISC USE AS DIRECTED 9 each 0   cyclobenzaprine  (FLEXERIL ) 10 MG tablet Take 10 mg by mouth at bedtime as needed for muscle spasms.     diclofenac  sodium (VOLTAREN ) 1 % GEL Apply 4 g topically 4 (four) times daily. 100 g 11   docusate sodium (COLACE) 100 MG capsule Take 100 mg by mouth 2 (two) times daily.  Dulaglutide  (TRULICITY ) 4.5 MG/0.5ML SOAJ INJECT 4.5 MG AS DIRECTED ONCE A WEEK. 6 mL 1   fluconazole  (DIFLUCAN ) 200 MG tablet Take 200 mg by mouth once a week.     fluorometholone (FML) 0.1 % ophthalmic suspension Place 1 drop into both eyes as needed.     fluticasone  (FLONASE ) 50 MCG/ACT nasal spray SPRAY 2 SPRAYS INTO EACH NOSTRIL EVERY DAY 48 mL 3   gabapentin  (NEURONTIN ) 300 MG capsule Take 300 mg by mouth 3 (three) times daily.     Glucagon   (BAQSIMI  ONE PACK) 3 MG/DOSE POWD Place 1 Device into the nose as needed (Low blood sugar with impaired consciousness). 2 each 3   Halobetasol & Lactic Acid (ULTRAVATE X, OINTMENT, EX) Apply 1 Application topically 2 (two) times daily.     HYDROcodone -acetaminophen  (NORCO/VICODIN) 5-325 MG tablet Take 1 tablet by mouth as needed for moderate pain (pain score 4-6).     insulin  glargine (LANTUS  SOLOSTAR) 100 UNIT/ML Solostar Pen Inject 18 Units into the skin daily. Take 18 units qam 15 mL 1   insulin  lispro (HUMALOG  KWIKPEN) 100 UNIT/ML KwikPen Inject 5 Units into the skin 3 (three) times daily. 15 mL 1   Insulin  Pen Needle (B-D UF III MINI PEN NEEDLES) 31G X 5 MM MISC USE AS DIRECTED ONCE DAILY AT 6:00AM AS DIRECTED WITH LANTUS  100 each 11   ipratropium (ATROVENT) 0.06 % nasal spray Place 1 spray into both nostrils as needed.     levalbuterol  (XOPENEX  HFA) 45 MCG/ACT inhaler Inhale 1 puff into the lungs every 4 (four) hours as needed for wheezing. 1 each 12   levocetirizine (XYZAL ) 5 MG tablet Take 1 tablet (5 mg total) by mouth every evening. 90 tablet 1   levothyroxine  (SYNTHROID ) 112 MCG tablet Take 1 tablet (112 mcg total) by mouth daily. 90 tablet 1   lidocaine  (LIDODERM ) 5 % PLACE 1 PATCH ONTO THE SKIN DAILY. REMOVE & DISCARD PATCH WITHIN 12 HOURS OR AS DIRECTED BY MD 30 patch 0   MAGNESIUM PO Take 3 tablets by mouth daily as needed (foot cramps).     methocarbamol (ROBAXIN) 750 MG tablet Take 750 mg by mouth every 6 (six) hours as needed.     Multiple Vitamin (MULTIVITAMIN) tablet Take 1 tablet by mouth in the morning and at bedtime.     nitrofurantoin , macrocrystal-monohydrate, (MACROBID ) 100 MG capsule Take 1 capsule (100 mg total) by mouth 2 (two) times daily for 5 days. 10 capsule 0   nitroGLYCERIN  (NITROSTAT ) 0.4 MG SL tablet Place 1 tablet (0.4 mg total) under the tongue every 5 (five) minutes as needed for chest pain. 25 tablet 3   pantoprazole  (PROTONIX ) 40 MG tablet Take 1 tablet (40  mg total) by mouth 2 (two) times daily. 180 tablet 0   phenazopyridine  (PYRIDIUM ) 100 MG tablet Take 1 tablet (100 mg total) by mouth 3 (three) times daily with meals. 9 tablet 0   traMADol  (ULTRAM ) 50 MG tablet Take 50 mg by mouth every 6 (six) hours as needed (pain).     benzonatate  (TESSALON ) 200 MG capsule Take 1 capsule (200 mg total) by mouth 2 (two) times daily as needed for cough. (Patient not taking: Reported on 03/05/2024) 20 capsule 0   Blood Glucose Monitoring Suppl DEVI 1 each by Does not apply route in the morning, at noon, and at bedtime. May substitute to any manufacturer covered by patient's insurance. 1 each 0   Glucose Blood (BLOOD GLUCOSE TEST STRIPS) STRP 1 each by In  Vitro route in the morning, at noon, and at bedtime. May substitute to any manufacturer covered by patient's insurance. 100 each 3   ONETOUCH ULTRA test strip USE AS INSTRUCTED 100 strip 2   repaglinide  (PRANDIN ) 2 MG tablet Take 1 tablet (2 mg total) by mouth 3 (three) times daily before meals. 270 tablet 3   No current facility-administered medications for this visit.     Objective:  BP 126/72   Pulse 75   Temp (!) 97.2 F (36.2 C)   Ht 5\' 11"  (1.803 m)   Wt 199 lb (90.3 kg)   SpO2 98%   BMI 27.75 kg/m  Gen: NAD, resting comfortably CV: RRR no murmurs rubs or gallops Lungs: CTAB no crackles, wheeze, rhonchi Abdomen: soft/nontender/nondistended/normal bowel sounds.  Ext: 1+ edema left > right Skin: warm, dry  Results for orders placed or performed in visit on 03/05/24 (from the past 24 hours)  POCT Urinalysis Dipstick (Automated)     Status: Abnormal   Collection Time: 03/05/24  4:14 PM  Result Value Ref Range   Color, UA orange    Clarity, UA clear    Glucose, UA Negative Negative   Bilirubin, UA neg    Ketones, UA neg    Spec Grav, UA 1.010 1.010 - 1.025   Blood, UA positive    pH, UA 6.5 5.0 - 8.0   Protein, UA Positive (A) Negative   Urobilinogen, UA 0.2 0.2 or 1.0 E.U./dL   Nitrite,  UA positive    Leukocytes, UA Large (3+) (A) Negative   *Note: Due to a large number of results and/or encounters for the requested time period, some results have not been displayed. A complete set of results can be found in Results Review.       Assessment and Plan   #Concern for UTI S: Patients symptoms started yesterday worsening- had symptoms for a few days prior.  Had been seen in January for similar symptoms and was initially treated with Augmentin  but was resistant to this and later changed to nitrofurantoin .  Also had Klebsiella pneumonia in November but had been resistant to nitrofurantoin  Complains of dysuria: YES; polyuria: YES; nocturia: YES; urgency: YES. Has incontinence. Minimal suprapubic pain. Some chills last night- temp at 75 and put heat on Symptoms are worsening.  ROS- no fever, nausea, vomiting, flank pain. No blood in urine.  A/P: urinalysis concerning for UTI/acute cystitis. Will get culture. Empiric treatment with: nitrofurantoin  (allergy  to ciprofloxacin and resistance last E. Coli urinary tract infection (UTI) to Augmentin  and keflex  and concerned for potassium increase on bactrim ) Patient to follow up if new or worsening symptoms or failure to improve.  -discussed we may have to alter antibiotic(s) based on culture results- should have back at latest by Monday -Has now had 3 UTIs (2 previously confirmed plus possible UTI today) since November we discussed possible prophylaxis but she would like to avoid adding additional medicine to her regimen-encouraged her to schedule follow-up with PCP to discuss further  # Diabetes-working with endocrinology S: Medication: Trulicity  4.5 mg, Lantus , lispro being titrated by endocrinology.  She reports no longer on repaglinide  2 mg 3 times a day before meals CBGs-patient report blood sugar improving Lab Results  Component Value Date   HGBA1C 8.9 (A) 10/17/2023   HGBA1C 7.1 (H) 02/22/2023   HGBA1C 7.5 (A) 10/15/2022   A/P:  Poorly controlled-we discussed that poor control diabetes is likely contributing to her UTIs-encourage close follow-up with endocrinology and continue current medications for  now-hopefully we can decrease frequency of UTIs if we improve diabetes control   Recommended follow up: Return for as needed for new, worsening, persistent symptoms. Future Appointments  Date Time Provider Department Center  04/09/2024 11:30 AM [facility] None  04/15/2024  2:40 PM Motwani, Joanna Muck, MD [facility] None  06/04/2024  1:20 PM Deveshwar, Clydie Darter, MD [facility] None    Lab/Order associations:   ICD-10-CM   1. Burning with urination  R30.0 POCT Urinalysis Dipstick (Automated)    Urine Culture    CANCELED: Urine Culture    2. Acute cystitis without hematuria  N30.00     3. Poorly controlled type 2 diabetes mellitus (HCC)  E11.65       Meds ordered this encounter  Medications   nitrofurantoin , macrocrystal-monohydrate, (MACROBID ) 100 MG capsule    Sig: Take 1 capsule (100 mg total) by mouth 2 (two) times daily for 5 days.    Dispense:  10 capsule    Refill:  0    Return precautions advised.  Clarisa Crooked, MD

## 2024-03-05 NOTE — Patient Instructions (Addendum)
 urinalysis concerning for UTI. Will get culture. Empiric treatment with: nitrofurantoin  (allergy  to ciprofloxacin and resistance last E. Coli urinary tract infection (UTI) to Augmentin  and keflex  and concerned for potassium increase on bactrim ) Patient to follow up if new or worsening symptoms or failure to improve.  -discussed we may have to alter antibiotic(s) based on culture results- should have back at latest by monday  Recommended follow up: Return for as needed for new, worsening, persistent symptoms. Also schedule follow up with Dr. Daneil Dunker in next month or two

## 2024-03-08 LAB — URINE CULTURE
MICRO NUMBER:: 16370949
SPECIMEN QUALITY:: ADEQUATE

## 2024-03-09 ENCOUNTER — Encounter: Payer: Self-pay | Admitting: Family Medicine

## 2024-03-18 ENCOUNTER — Other Ambulatory Visit: Payer: Self-pay

## 2024-04-08 DIAGNOSIS — E1122 Type 2 diabetes mellitus with diabetic chronic kidney disease: Secondary | ICD-10-CM | POA: Diagnosis not present

## 2024-04-08 DIAGNOSIS — K219 Gastro-esophageal reflux disease without esophagitis: Secondary | ICD-10-CM | POA: Diagnosis not present

## 2024-04-08 DIAGNOSIS — Z9884 Bariatric surgery status: Secondary | ICD-10-CM | POA: Diagnosis not present

## 2024-04-08 DIAGNOSIS — I890 Lymphedema, not elsewhere classified: Secondary | ICD-10-CM | POA: Diagnosis not present

## 2024-04-08 DIAGNOSIS — N1832 Chronic kidney disease, stage 3b: Secondary | ICD-10-CM | POA: Diagnosis not present

## 2024-04-08 DIAGNOSIS — E039 Hypothyroidism, unspecified: Secondary | ICD-10-CM | POA: Diagnosis not present

## 2024-04-09 ENCOUNTER — Other Ambulatory Visit

## 2024-04-09 DIAGNOSIS — E890 Postprocedural hypothyroidism: Secondary | ICD-10-CM | POA: Diagnosis not present

## 2024-04-09 LAB — TSH: TSH: 8.25 m[IU]/L — ABNORMAL HIGH (ref 0.40–4.50)

## 2024-04-09 LAB — T4, FREE: Free T4: 1.2 ng/dL (ref 0.8–1.8)

## 2024-04-13 ENCOUNTER — Encounter: Payer: Self-pay | Admitting: Internal Medicine

## 2024-04-15 ENCOUNTER — Encounter: Payer: Self-pay | Admitting: "Endocrinology

## 2024-04-15 ENCOUNTER — Ambulatory Visit (INDEPENDENT_AMBULATORY_CARE_PROVIDER_SITE_OTHER): Admitting: "Endocrinology

## 2024-04-15 VITALS — BP 130/80 | HR 84 | Ht 71.0 in | Wt 206.0 lb

## 2024-04-15 DIAGNOSIS — E890 Postprocedural hypothyroidism: Secondary | ICD-10-CM | POA: Diagnosis not present

## 2024-04-15 DIAGNOSIS — E1165 Type 2 diabetes mellitus with hyperglycemia: Secondary | ICD-10-CM

## 2024-04-15 DIAGNOSIS — Z7985 Long-term (current) use of injectable non-insulin antidiabetic drugs: Secondary | ICD-10-CM

## 2024-04-15 DIAGNOSIS — E7800 Pure hypercholesterolemia, unspecified: Secondary | ICD-10-CM

## 2024-04-15 DIAGNOSIS — Z794 Long term (current) use of insulin: Secondary | ICD-10-CM

## 2024-04-15 MED ORDER — LEVOTHYROXINE SODIUM 125 MCG PO TABS: 125.0000 ug | ORAL_TABLET | Freq: Every day | ORAL | 1 refills | Status: DC

## 2024-04-15 NOTE — Progress Notes (Signed)
 Outpatient Endocrinology Note Savannah Newcomer, MD  04/15/24   Savannah [name] Dec 10, 1955 [id number]  Referring Provider: Rodney Clamp, MD Primary Care Provider: Rodney Clamp, MD Reason for consultation: Subjective   Assessment & Plan  Diagnoses and all orders for this visit:  Postoperative hypothyroidism  Uncontrolled type 2 diabetes mellitus with hyperglycemia (HCC)  Long-term (current) use of injectable non-insulin  antidiabetic drugs  Long-term insulin  use (HCC)  Pure hypercholesterolemia  Other orders -     levothyroxine  (SYNTHROID ) 125 MCG tablet; Take 1 tablet (125 mcg total) by mouth daily.    History of thyroidectomy on levothyroxine  100mcg po every day, takes with other meds Recommend to take levothyroxine  first thing in the morning on empty stomach and wait at least 30 minutes to 1 hour before eating or drinking anything or taking any other medications. Space out levothyroxine  by 4 hours from any acid reflux medication, fibrate, iron , calcium , multivitamin, and nutritional supplements.  02/27/24: start LT4 112 mcg po qd, TSH at 8 04/15/24: start LT4 125 mcg po qd Repeat lab in 3 mo  Diabetes Type II complicated by heart stents, neuropathy, nephropathy  She took insulin  from 2004-2011, when she had gastric bypass surgery Lab Results  Component Value Date   GFR 28.90 (L) 04/03/2023   Hba1c goal less than 7, current Hba1c is  Lab Results  Component Value Date   HGBA1C 8.9 (A) 10/17/2023   Will recommend the following: Plan to start farxiga per another doctor, gets UTI 2-3 times/year but wants to try it, warned against UTI and to stop if UTIs worsen  Trulicity  4.5 mg weekly Lantus  25 units qam  Humalog  15 units with break fast and lunch, 16-18 with supper 15 min before meals  Correction scale: Use in addition to your meal time/short acting insulin  based on blood sugars as follows: 201 - 225: 3 units 226 - 250: 4 units 251 - 275: 5 units 276 - 300: 6  units 301 - 325: 7 units 326 - 350: 8 units 351 - 375: 9 units 376 - 400: 10 units   Stopped Repaglinide  2 mg tid - not helping  Stopped Invokana  300 mg every day-gets UTIs and has pelvic lichen sclerosis   Has DexCom Ordered DM education previously   No known contraindications/side effects to any of above medications No history of MEN syndrome/medullary thyroid  cancer/pancreatitis or pancreatic cancer in self or family Glucagon  discussed and prescribed with refills on 04/15/24  -Last LD and Tg are as follows: Lab Results  Component Value Date   LDLCALC 58 02/20/2024    Lab Results  Component Value Date   TRIG 94 02/20/2024   -On atorvastatin  40 mg QD -Follow low fat diet and exercise   -Blood pressure goal <140/90 - Microalbumin/creatinine goal is < 30 -Last MA/Cr is as follows: Lab Results  Component Value Date   MICROALBUR 0.7 02/22/2023   -not on ACE/ARB, sees a nephrologist  -diet changes including salt restriction -limit eating outside -counseled BP targets per standards of diabetes care -uncontrolled blood pressure can lead to retinopathy, nephropathy and cardiovascular and atherosclerotic heart disease  Reviewed and counseled on: -A1C target -Blood sugar targets -Complications of uncontrolled diabetes  -Checking blood sugar before meals and bedtime and bring log next visit -All medications with mechanism of action and side effects -Hypoglycemia management: rule of 15's, Glucagon  Emergency Kit and medical alert ID -low-carb low-fat plate-method diet -At least 20 minutes of physical activity per day -Annual dilated retinal  eye exam and foot exam -compliance and follow up needs -follow up as scheduled or earlier if problem gets worse  Call if blood sugar is less than 70 or consistently above 250    Take a 15 gm snack of carbohydrate at bedtime before you go to sleep if your blood sugar is less than 100.    If you are going to fast after midnight for a  test or procedure, ask your physician for instructions on how to reduce/decrease your insulin  dose.    Call if blood sugar is less than 70 or consistently above 250  -Treating a low sugar by rule of 15  (15 gms of sugar every 15 min until sugar is more than 70) If you feel your sugar is low, test your sugar to be sure If your sugar is low (less than 70), then take 15 grams of a fast acting Carbohydrate (3-4 glucose tablets or glucose gel or 4 ounces of juice or regular soda) Recheck your sugar 15 min after treating low to make sure it is more than 70 If sugar is still less than 70, treat again with 15 grams of carbohydrate          Don't drive the hour of hypoglycemia  If unconscious/unable to eat or drink by mouth, use glucagon  injection or nasal spray baqsimi  and call 911. Can repeat again in 15 min if still unconscious.  Return in about 1 week (around 04/22/2024).   I have reviewed current medications, nurse's notes, allergies, vital signs, past medical and surgical history, family medical history, and social history for this encounter. Counseled patient on symptoms, examination findings, lab findings, imaging results, treatment decisions and monitoring and prognosis. The patient understood the recommendations and agrees with the treatment plan. All questions regarding treatment plan were fully answered.  Savannah Newcomer, MD  04/15/24    History of Present Illness Savannah [name] is a 68 y.o. year old female who presents for evaluation of Type II diabetes mellitus and hypothyroidism.  C/o tiredness, dry skin   Savannah [name] was first diagnosed in 2000.   Diabetes education +  Home diabetes regimen: Lantus  16 units qam  Start Humalog  12 units tidac 15 min before meals +-1 units for BG>150 or BG <70 respectively  Trulicity  4.5 mg weekly Repaglinide  2 mg tid   COMPLICATIONS - MI/Stroke, + angina s/p heart stents  -  retinopathy +  neuropathy +  nephropathy  BLOOD SUGAR DATA  CGM  interpretation: At today's visit, we reviewed her CGM downloads. The full report is scanned in the media. Reviewing the CGM trends, BG are elevated almost across the day.   Physical Exam  BP 130/80   Pulse 84   Ht 5\' 11"  (1.803 m)   Wt 206 lb (93.4 kg)   SpO2 98%   BMI 28.73 kg/m    Constitutional: well developed, well nourished Head: normocephalic, atraumatic Eyes: sclera anicteric, no redness Neck: supple Lungs: normal respiratory effort Neurology: alert and oriented Skin: dry, no appreciable rashes Musculoskeletal: no appreciable defects Psychiatric: normal mood and affect Diabetic Foot Exam - Simple   No data filed      Current Medications Patient's Medications  New Prescriptions   LEVOTHYROXINE  (SYNTHROID ) 125 MCG TABLET    Take 1 tablet (125 mcg total) by mouth daily.  Previous Medications   ACETAMINOPHEN  (TYLENOL ) 500 MG TABLET    Take 2 tablets (1,000 mg total) by mouth every 6 (six) hours as needed for mild pain  or fever.   ATORVASTATIN  (LIPITOR) 40 MG TABLET    Take 1 tablet (40 mg total) by mouth daily.   BLOOD GLUCOSE METER KIT AND SUPPLIES KIT    Use up to three  times daily as directed.   BLOOD GLUCOSE MONITORING SUPPL DEVI    1 each by Does not apply route in the morning, at noon, and at bedtime. May substitute to any manufacturer covered by patient's insurance.   CETIRIZINE  (ZYRTEC ) 10 MG TABLET    TAKE 1 TABLET BY MOUTH EVERY DAY   CHOLECALCIFEROL (VITAMIN D ) 50 MCG (2000 UT) TABLET    Take 4,000 Units by mouth in the morning.   CLOPIDOGREL  (PLAVIX ) 75 MG TABLET    Take 1 tablet (75 mg total) by mouth daily.   CLOTRIMAZOLE  (MYCELEX ) 10 MG TROCHE    Take 1 tablet (10 mg total) by mouth 5 (five) times daily.   COLCHICINE  (MITIGARE ) 0.6 MG CAPS    Take by mouth as needed.   CONTINUOUS GLUCOSE SENSOR (DEXCOM G7 SENSOR) MISC    USE AS DIRECTED   CYCLOBENZAPRINE  (FLEXERIL ) 10 MG TABLET    Take 10 mg by mouth at bedtime as needed for muscle spasms.   DICLOFENAC   SODIUM (VOLTAREN ) 1 % GEL    Apply 4 g topically 4 (four) times daily.   DOCUSATE SODIUM (COLACE) 100 MG CAPSULE    Take 100 mg by mouth 2 (two) times daily.   DULAGLUTIDE  (TRULICITY ) 4.5 MG/0.5ML SOAJ    INJECT 4.5 MG AS DIRECTED ONCE A WEEK.   FLUCONAZOLE  (DIFLUCAN ) 200 MG TABLET    Take 200 mg by mouth once a week.   FLUOROMETHOLONE (FML) 0.1 % OPHTHALMIC SUSPENSION    Place 1 drop into both eyes as needed.   FLUTICASONE  (FLONASE ) 50 MCG/ACT NASAL SPRAY    SPRAY 2 SPRAYS INTO EACH NOSTRIL EVERY DAY   GABAPENTIN  (NEURONTIN ) 300 MG CAPSULE    Take 300 mg by mouth 3 (three) times daily.   GLUCAGON  (BAQSIMI  ONE PACK) 3 MG/DOSE POWD    Place 1 Device into the nose as needed (Low blood sugar with impaired consciousness).   HALOBETASOL & LACTIC ACID (ULTRAVATE X, OINTMENT, EX)    Apply 1 Application topically 2 (two) times daily.   HYDROCODONE -ACETAMINOPHEN  (NORCO/VICODIN) 5-325 MG TABLET    Take 1 tablet by mouth as needed for moderate pain (pain score 4-6).   INSULIN  GLARGINE (LANTUS  SOLOSTAR) 100 UNIT/ML SOLOSTAR PEN    Inject 18 Units into the skin daily. Take 18 units qam   INSULIN  LISPRO (HUMALOG  KWIKPEN) 100 UNIT/ML KWIKPEN    Inject 5 Units into the skin 3 (three) times daily.   INSULIN  PEN NEEDLE (B-D UF III MINI PEN NEEDLES) 31G X 5 MM MISC    USE AS DIRECTED ONCE DAILY AT 6:00AM AS DIRECTED WITH LANTUS    IPRATROPIUM (ATROVENT) 0.06 % NASAL SPRAY    Place 1 spray into both nostrils as needed.   LEVALBUTEROL  (XOPENEX  HFA) 45 MCG/ACT INHALER    Inhale 1 puff into the lungs every 4 (four) hours as needed for wheezing.   LEVOCETIRIZINE (XYZAL ) 5 MG TABLET    Take 1 tablet (5 mg total) by mouth every evening.   LIDOCAINE  (LIDODERM ) 5 %    PLACE 1 PATCH ONTO THE SKIN DAILY. REMOVE & DISCARD PATCH WITHIN 12 HOURS OR AS DIRECTED BY MD   MAGNESIUM PO    Take 3 tablets by mouth daily as needed (foot cramps).   METHOCARBAMOL (ROBAXIN) 750  MG TABLET    Take 750 mg by mouth every 6 (six) hours as needed.    MULTIPLE VITAMIN (MULTIVITAMIN) TABLET    Take 1 tablet by mouth in the morning and at bedtime.   NITROGLYCERIN  (NITROSTAT ) 0.4 MG SL TABLET    Place 1 tablet (0.4 mg total) under the tongue every 5 (five) minutes as needed for chest pain.   PANTOPRAZOLE  (PROTONIX ) 40 MG TABLET    Take 1 tablet (40 mg total) by mouth 2 (two) times daily.   PHENAZOPYRIDINE  (PYRIDIUM ) 100 MG TABLET    Take 1 tablet (100 mg total) by mouth 3 (three) times daily with meals.   REPAGLINIDE  (PRANDIN ) 2 MG TABLET    Take 1 tablet (2 mg total) by mouth 3 (three) times daily before meals.   TRAMADOL  (ULTRAM ) 50 MG TABLET    Take 50 mg by mouth every 6 (six) hours as needed (pain).  Modified Medications   No medications on file  Discontinued Medications   LEVOTHYROXINE  (SYNTHROID ) 112 MCG TABLET    Take 1 tablet (112 mcg total) by mouth daily.    Allergies Allergies  Allergen Reactions   Ace Inhibitors Cough   Caffeine     PVCs   Ciprofloxacin Itching   Morphine  And Codeine Itching    Pt tolerates medication if given with diphenhydramine    Mucinex [Guaifenesin Er]     PVCs   Nsaids     Gastric Bypass Surgery - unable to take, tolerates ec 81 aspirin     Other     Antihistamine-alkylamine - PVCs tolerates benadryl     Silicone Hives   Tape Hives    Past Medical History Past Medical History:  Diagnosis Date   ABDOMINAL PAIN, CHRONIC 10/20/2009   Acute cystitis 08/17/2009   ALLERGIC RHINITIS 11/21/2007   Allergy     ASYMPTOMATIC POSTMENOPAUSAL STATUS 09/29/2008   Bariatric surgery status 07/07/2010   Bariatric surgery status 07/07/2010   Qualifier: Diagnosis of  By: Washington Hacker MD, Sean A    Cancer [facility])    uterine   Cataract    Coronary artery disease    DEPRESSION 11/21/2007   DIABETES MELLITUS, TYPE II 07/08/2007   DYSPNEA 05/20/2009   Edema 05/20/2009   Eosinophilic esophagitis 02/13/2008   FEVER UNSPECIFIED 10/20/2009   FEVER, HX OF 03/09/2010   GERD (gastroesophageal reflux disease)     Headache(784.0) 02/06/2010   Hepatomegaly 01/06/2008   HYPERLIPIDEMIA 07/08/2007   HYPERTENSION 07/08/2007   Kidney disease    followed by nephrology, per patient   LEUKOPENIA, MILD 09/29/2008   Lymphedema    left leg, dx approximately in 03/2023 per patient   Melanoma in situ of left upper arm (HCC) 04/03/2023   OBESITY 01/06/2008   OSTEOARTHRITIS 01/06/2008   Other chronic nonalcoholic liver disease 01/06/2008   PERIPHERAL NEUROPATHY 11/21/2007   Peripheral neuropathy    Postsurgical hypothyroidism 09/19/2010   SINUSITIS- ACUTE-NOS 11/21/2007   TB SKIN TEST, POSITIVE 02/06/2010   THYROID  NODULE 03/09/2010    Past Surgical History Past Surgical History:  Procedure Laterality Date   ABDOMINAL HYSTERECTOMY     BASAL CELL CARCINOMA EXCISION     CARDIAC CATHETERIZATION     CHOLECYSTECTOMY N/A 05/11/2021   Procedure: LAPAROSCOPIC CHOLECYSTECTOMY WITH POSSIBLE  INTRAOPERATIVE CHOLANGIOGRAM;  Surgeon: Jacolyn Matar, MD;  Location: [facility];  Service: General;  Laterality: N/A;   COLONOSCOPY     CORONARY ATHERECTOMY N/A 05/03/2022   Procedure: CORONARY ATHERECTOMY;  Surgeon: Kyra Phy, MD;  Location: [facility];  Service: Cardiovascular;  Laterality: N/A;   CORONARY IMAGING/OCT N/A 05/03/2022   Procedure: INTRAVASCULAR IMAGING/OCT;  Surgeon: Kyra Phy, MD;  Location: [facility];  Service: Cardiovascular;  Laterality: N/A;   CORONARY PRESSURE/FFR STUDY N/A 05/03/2022   Procedure: INTRAVASCULAR PRESSURE WIRE/FFR STUDY;  Surgeon: Kyra Phy, MD;  Location: [facility];  Service: Cardiovascular;  Laterality: N/A;   CORONARY STENT INTERVENTION N/A 05/03/2022   Procedure: CORONARY STENT INTERVENTION;  Surgeon: Kyra Phy, MD;  Location: [facility];  Service: Cardiovascular;  Laterality: N/A;  lad   EYE SURGERY Bilateral 08/2018, 09/2018   FOOT SURGERY Left    10/2019   KNEE ARTHROSCOPY Left    LEFT HEART CATH AND CORONARY  ANGIOGRAPHY N/A 01/20/2019   Procedure: LEFT HEART CATH AND CORONARY ANGIOGRAPHY;  Surgeon: Arty Binning, MD;  Location: [facility];  Service: Cardiovascular;  Laterality: N/A;   LYMPH NODE DISSECTION     OOPHORECTOMY     Right total knee replacement     RIGHT/LEFT HEART CATH AND CORONARY ANGIOGRAPHY N/A 05/03/2022   Procedure: RIGHT/LEFT HEART CATH AND CORONARY ANGIOGRAPHY;  Surgeon: Kyra Phy, MD;  Location: [facility];  Service: Cardiovascular;  Laterality: N/A;   ROUX-EN-Y GASTRIC BYPASS  2011   SKIN TAG REMOVAL     THYROIDECTOMY     TONSILLECTOMY AND ADENOIDECTOMY     TOOTH EXTRACTION  2023   UPPER GASTROINTESTINAL ENDOSCOPY     UPPER GI ENDOSCOPY  2024    Family History family history includes Bladder Cancer in her sister; Breast cancer in her maternal aunt and sister; Colon cancer in her maternal uncle; Congestive Heart Failure in her father; Diabetes in her father, maternal aunt, maternal grandmother, maternal uncle, mother, and paternal grandmother; Hypertension in her father; Liver cancer in her maternal aunt; Multiple sclerosis in her sister; Myelodysplastic syndrome in her father; Neuropathy in her mother and paternal aunt; Osteoporosis in her sister.  Social History Social History   Socioeconomic History   Marital status: Single    Spouse name: Not on file   Number of children: 0   Years of education: 16   Highest education level: Bachelor's degree (e.g., BA, AB, BS)  Occupational History   Occupation: Research scientist (medical)   Occupation: Retired  Tobacco Use   Smoking status: Former    Current packs/day: 0.00    Average packs/day: 1.5 packs/day for 15.0 years (22.5 ttl pk-yrs)    Types: Cigarettes    Start date: 59    Quit date: 1992    Years since quitting: 33.4    Passive exposure: Never   Smokeless tobacco: Never  Vaping Use   Vaping status: Never Used  Substance and Sexual Activity   Alcohol use: Yes    Comment: rarely   Drug use: Never    Sexual activity: Not Currently  Other Topics Concern   Not on file  Social History Narrative   Not on file   Social Drivers of Health   Financial Resource Strain: Low Risk  (11/22/2023)   Overall Financial Resource Strain (CARDIA)    Difficulty of Paying Living Expenses: Not hard at all  Food Insecurity: No Food Insecurity (11/22/2023)   Hunger Vital Sign    Worried About Running Out of Food in the Last Year: Never true    Ran Out of Food in the Last Year: Never true  Transportation Needs: No Transportation Needs (11/22/2023)   PRAPARE - Transportation  Lack of Transportation (Medical): No    Lack of Transportation (Non-Medical): No  Physical Activity: Unknown (11/22/2023)   Exercise Vital Sign    Days of Exercise per Week: 0 days    Minutes of Exercise per Session: Not on file  Stress: No Stress Concern Present (11/22/2023)   [employer] of Occupational Health - Occupational Stress Questionnaire    Feeling of Stress : Not at all  Social Connections: Moderately Integrated (11/22/2023)   Social Connection and Isolation Panel [NHANES]    Frequency of Communication with Friends and Family: Three times a week    Frequency of Social Gatherings with Friends and Family: Three times a week    Attends Religious Services: More than 4 times per year    Active Member of Clubs or Organizations: Yes    Attends Club or Organization Meetings: More than 4 times per year    Marital Status: Never married  Intimate Partner Violence: Not At Risk (05/19/2020)   Humiliation, Afraid, Rape, and Kick questionnaire    Fear of Current or Ex-Partner: No    Emotionally Abused: No    Physically Abused: No    Sexually Abused: No    Lab Results  Component Value Date   HGBA1C 8.9 (A) 10/17/2023   HGBA1C 7.1 (H) 02/22/2023   HGBA1C 7.5 (A) 10/15/2022   Lab Results  Component Value Date   CHOL 120 02/20/2024   Lab Results  Component Value Date   HDL 44 (L) 02/20/2024   Lab Results  Component  Value Date   LDLCALC 58 02/20/2024   Lab Results  Component Value Date   TRIG 94 02/20/2024   Lab Results  Component Value Date   CHOLHDL 2.7 02/20/2024   Lab Results  Component Value Date   CREATININE 1.78 (H) 04/03/2023   Lab Results  Component Value Date   GFR 28.90 (L) 04/03/2023   Lab Results  Component Value Date   MICROALBUR 0.7 02/22/2023      Component Value Date/Time   NA 138 04/03/2023 1631   NA 141 05/01/2022 0943   K 5.0 04/03/2023 1631   CL 107 04/03/2023 1631   CO2 23 04/03/2023 1631   GLUCOSE 171 (H) 04/03/2023 1631   BUN 27 (H) 04/03/2023 1631   BUN 22 05/01/2022 0943   CREATININE 1.78 (H) 04/03/2023 1631   CREATININE 1.51 (H) 02/22/2023 1513   CALCIUM  9.1 04/03/2023 1631   CALCIUM  8.9 01/30/2013 1017   PROT 6.7 04/03/2023 1631   PROT 5.8 (L) 10/09/2021 1214   ALBUMIN 4.1 04/03/2023 1631   ALBUMIN 4.0 10/09/2021 1214   AST 35 04/03/2023 1631   AST 25 02/29/2020 1303   ALT 51 (H) 04/03/2023 1631   ALT 37 02/29/2020 1303   ALKPHOS 105 04/03/2023 1631   BILITOT 0.4 04/03/2023 1631   BILITOT 0.4 10/09/2021 1214   BILITOT 0.4 02/29/2020 1303   GFRNONAA >60 05/13/2021 0645   GFRNONAA 63 08/16/2020 1207   GFRAA 73 08/16/2020 1207      Latest Ref Rng & Units 04/03/2023    4:31 PM 02/22/2023    3:13 PM 11/20/2022    3:15 PM  BMP  Glucose 70 - 99 mg/dL 409  811  914   BUN 6 - 23 mg/dL 27  19  17    Creatinine 0.40 - 1.20 mg/dL 7.82  9.56  2.13   BUN/Creat Ratio 6 - 22 (calc)  13    Sodium 135 - 145 mEq/L 138  139  143  Potassium 3.5 - 5.1 mEq/L 5.0  4.9  4.7   Chloride 96 - 112 mEq/L 107  109  110   CO2 19 - 32 mEq/L 23  24  24    Calcium  8.4 - 10.5 mg/dL 9.1  8.7  8.9        Component Value Date/Time   WBC 5.2 04/03/2023 1631   RBC 3.83 (L) 04/03/2023 1631   HGB 12.1 04/03/2023 1631   HGB 13.6 05/01/2022 0943   HCT 37.9 04/03/2023 1631   HCT 40.5 05/01/2022 0943   PLT 215.0 04/03/2023 1631   PLT 187 05/01/2022 0943   MCV 99.0  04/03/2023 1631   MCV 96 05/01/2022 0943   MCH 31.8 02/22/2023 1513   MCHC 31.9 04/03/2023 1631   RDW 14.6 04/03/2023 1631   RDW 13.0 05/01/2022 0943   LYMPHSABS 1.4 04/03/2023 1631   LYMPHSABS 1.2 05/01/2022 0943   MONOABS 0.4 04/03/2023 1631   EOSABS 0.4 04/03/2023 1631   EOSABS 0.3 05/01/2022 0943   BASOSABS 0.0 04/03/2023 1631   BASOSABS 0.0 05/01/2022 0943     Parts of this note may have been dictated using voice recognition software. There may be variances in spelling and vocabulary which are unintentional. Not all errors are proofread. Please notify the [facility] if any discrepancies are noted or if the meaning of any statement is not clear.

## 2024-04-15 NOTE — Progress Notes (Unsigned)
 Office Visit Note  [name]             Date of Birth: 10/12/1956           MRN: [id number]             PCP: Rodney Clamp, MD Referring: Rodney Clamp, MD Visit Date: 04/16/2024 Occupation: @GUAROCC @  Subjective:  Flare in left wrist   History of Present Illness: Savannah [name] is a 68 y.o. female with history of chronic inflammatory arthritis.  [name] presents today experiencing a flare involving Savannah left wrist.  According to Savannah [name] Savannah flare started 1 week ago but progressively has been worsening.  She is using a brace for support.  She denies any injury prior to Savannah onset of symptoms.  Her symptoms are most severe on Saturday to Savannah point that she started to take colchicine  0.6 mg 1 tablet twice daily on Saturday, Sunday, and Monday.  [name] states that since then she has only been taking colchicine  once daily.  She is also started to have some increased discomfort in her right wrist joint.  She denies any flare in her knee joints at this time.  [name] states that in Savannah past for flares her symptoms typically respond to having a Depo-Medrol  injection as well as oral steroid. [name] states that she was evaluated by her nephrologist last week and had updated lab work and was evaluated by her endocrinologist yesterday.   Activities of Daily Living:  [name] reports morning stiffness for 20-30 minutes.   [name] Reports nocturnal pain.  Difficulty dressing/grooming: Denies Difficulty climbing stairs: Reports Difficulty getting out of chair: Reports Difficulty using hands for taps, buttons, cutlery, and/or writing: Reports  Review of Systems  Constitutional:  Positive for fatigue.  HENT:  Positive for mouth dryness. Negative for mouth sores.   Eyes:  Positive for dryness.  Respiratory:  Positive for shortness of breath.   Cardiovascular:  Negative for chest pain and palpitations.  Gastrointestinal:  Positive for constipation and diarrhea. Negative for blood in  stool.  Endocrine: Negative for increased urination.  Genitourinary:  Positive for involuntary urination.  Musculoskeletal:  Positive for joint pain, gait problem, joint pain, joint swelling, myalgias, muscle weakness, morning stiffness and myalgias. Negative for muscle tenderness.  Skin:  Positive for color change, rash and sensitivity to sunlight. Negative for hair loss.  Allergic/Immunologic: Negative for susceptible to infections.  Neurological:  Positive for headaches. Negative for dizziness.  Hematological:  Negative for swollen glands.  Psychiatric/Behavioral:  Positive for depressed mood and sleep disturbance. Savannah [name] is not nervous/anxious.     PMFS History:  [name] Active Problem List   Diagnosis Date Noted   Bilateral carpal tunnel syndrome 03/13/2022   Leg edema 03/01/2022   Nondisplaced comminuted fracture of left patella, initial encounter for closed fracture 08/01/2021   Arthritis of carpometacarpal [facility]) joint of right thumb 07/28/2021   Primary osteoarthritis of right distal radioulnar joint 07/28/2021   Arthritis of wrist, right, degenerative 07/28/2021   History of malignant neoplasm of uterine body 07/27/2021   Osteopenia 01/14/2021   Sesamoiditis of left foot 11/09/2020   Posterior tibial tendinitis of left lower extremity 11/09/2020   Type 2 diabetes mellitus with diabetic peripheral angiopathy without gangrene, with long-term current use of insulin  (HCC) 10/21/2020   Iron  deficiency anemia 03/14/2020   Lesion of vulva 09/02/2019   Menorrhagia 09/02/2019   Neuropathy 09/02/2019   Irregular bowel habits 06/25/2019   Coronary artery disease  involving native coronary artery of native heart without angina pectoris    Temporomandibular joint disorder 12/05/2018   Fatigue 12/05/2018   Inverted nipple 12/05/2018   Senile purpura (HCC) 11/21/2018   GERD with esophagitis 11/21/2018   Pseudogout involving multiple joints 11/21/2018   Lipoma 07/22/2017   Lichen  sclerosus 11/16/2016   H/O gastric bypass 11/16/2016   Depression 11/16/2016   Diabetic polyneuropathy associated with type 2 diabetes mellitus (HCC) 11/16/2016   Uterine cancer (HCC) s/p hysterectomy 1997 01/04/2012   Postsurgical hypothyroidism 09/19/2010   Diabetic neuropathy (HCC) 11/21/2007   Allergic rhinitis 11/21/2007   Dyslipidemia associated with type 2 diabetes mellitus (HCC) 07/08/2007    Past Medical History:  Diagnosis Date   ABDOMINAL PAIN, CHRONIC 10/20/2009   Acute cystitis 08/17/2009   ALLERGIC RHINITIS 11/21/2007   Allergy     ASYMPTOMATIC POSTMENOPAUSAL STATUS 09/29/2008   Bariatric surgery status 07/07/2010   Bariatric surgery status 07/07/2010   Qualifier: Diagnosis of  By: Washington Hacker MD, Sean A    Cancer [facility])    uterine   Cataract    Coronary artery disease    DEPRESSION 11/21/2007   DIABETES MELLITUS, TYPE II 07/08/2007   DYSPNEA 05/20/2009   Edema 05/20/2009   Eosinophilic esophagitis 02/13/2008   FEVER UNSPECIFIED 10/20/2009   FEVER, HX OF 03/09/2010   GERD (gastroesophageal reflux disease)    Headache(784.0) 02/06/2010   Hepatomegaly 01/06/2008   HYPERLIPIDEMIA 07/08/2007   HYPERTENSION 07/08/2007   Kidney disease    followed by nephrology, per [name]   LEUKOPENIA, MILD 09/29/2008   Lymphedema    left leg, dx approximately in 03/2023 per [name]   Melanoma in situ of left upper arm (HCC) 04/03/2023   OBESITY 01/06/2008   OSTEOARTHRITIS 01/06/2008   Other chronic nonalcoholic liver disease 01/06/2008   PERIPHERAL NEUROPATHY 11/21/2007   Peripheral neuropathy    Postsurgical hypothyroidism 09/19/2010   SINUSITIS- ACUTE-NOS 11/21/2007   TB SKIN TEST, POSITIVE 02/06/2010   THYROID  NODULE 03/09/2010    Family History  Problem Relation Age of Onset   Diabetes Mother    Neuropathy Mother    Diabetes Father    Congestive Heart Failure Father    Hypertension Father    Myelodysplastic syndrome Father    Bladder Cancer Sister    Multiple  sclerosis Sister    Breast cancer Sister        breast onset age 45   Osteoporosis Sister    Liver cancer Maternal Aunt    Breast cancer Maternal Aunt    Diabetes Maternal Aunt    Colon cancer Maternal Uncle    Diabetes Maternal Uncle    Neuropathy Paternal Aunt    Diabetes Maternal Grandmother    Diabetes Paternal Grandmother    Past Surgical History:  Procedure Laterality Date   ABDOMINAL HYSTERECTOMY     BASAL CELL CARCINOMA EXCISION     CARDIAC CATHETERIZATION     CHOLECYSTECTOMY N/A 05/11/2021   Procedure: LAPAROSCOPIC CHOLECYSTECTOMY WITH POSSIBLE  INTRAOPERATIVE CHOLANGIOGRAM;  Surgeon: Jacolyn Matar, MD;  Location: [facility];  Service: General;  Laterality: N/A;   COLONOSCOPY     CORONARY ATHERECTOMY N/A 05/03/2022   Procedure: CORONARY ATHERECTOMY;  Surgeon: Kyra Phy, MD;  Location: [facility];  Service: Cardiovascular;  Laterality: N/A;   CORONARY IMAGING/OCT N/A 05/03/2022   Procedure: INTRAVASCULAR IMAGING/OCT;  Surgeon: Kyra Phy, MD;  Location: [facility];  Service: Cardiovascular;  Laterality: N/A;   CORONARY PRESSURE/FFR STUDY N/A 05/03/2022   Procedure: INTRAVASCULAR  PRESSURE WIRE/FFR STUDY;  Surgeon: Kyra Phy, MD;  Location: [facility];  Service: Cardiovascular;  Laterality: N/A;   CORONARY STENT INTERVENTION N/A 05/03/2022   Procedure: CORONARY STENT INTERVENTION;  Surgeon: Kyra Phy, MD;  Location: [facility];  Service: Cardiovascular;  Laterality: N/A;  lad   EYE SURGERY Bilateral 08/2018, 09/2018   FOOT SURGERY Left    10/2019   KNEE ARTHROSCOPY Left    LEFT HEART CATH AND CORONARY ANGIOGRAPHY N/A 01/20/2019   Procedure: LEFT HEART CATH AND CORONARY ANGIOGRAPHY;  Surgeon: Arty Binning, MD;  Location: [facility];  Service: Cardiovascular;  Laterality: N/A;   LYMPH NODE DISSECTION     OOPHORECTOMY     Right total knee replacement     RIGHT/LEFT HEART CATH AND CORONARY ANGIOGRAPHY N/A  05/03/2022   Procedure: RIGHT/LEFT HEART CATH AND CORONARY ANGIOGRAPHY;  Surgeon: Kyra Phy, MD;  Location: [facility];  Service: Cardiovascular;  Laterality: N/A;   ROUX-EN-Y GASTRIC BYPASS  2011   SKIN TAG REMOVAL     THYROIDECTOMY     TONSILLECTOMY AND ADENOIDECTOMY     TOOTH EXTRACTION  2023   UPPER GASTROINTESTINAL ENDOSCOPY     UPPER GI ENDOSCOPY  2024   Social History   Social History Narrative   Not on file   Immunization History  Administered Date(s) Administered   Fluad Quad(high Dose 65+) 07/13/2019, 07/17/2022   Influenza Inj Mdck Quad Pf 10/19/2017, 07/23/2018   Influenza Whole 07/30/2008, 08/17/2009, 09/19/2010   Influenza, High Dose Seasonal PF 08/15/2021   Influenza,inj,Quad PF,6-35 Mos 07/14/2015   Influenza-Unspecified 10/13/2011, 08/12/2013, 07/13/2017, 07/23/2018, 08/16/2023   Moderna Covid-19 Fall Seasonal Vaccine 1yrs & older 10/06/2022   PFIZER(Purple Top)SARS-COV-2 Vaccination 12/21/2019, 01/14/2020, 08/18/2020   Pfizer Covid-19 Vaccine Bivalent Booster 59yrs & up 08/15/2021   Pneumococcal Conjugate-13 10/14/2015   Pneumococcal Polysaccharide-23 06/25/2014, 10/21/2020   Respiratory Syncytial Virus Vaccine,Recomb Aduvanted(Arexvy) 07/17/2022   Tdap 05/20/2020   Zoster Recombinant(Shingrix) 05/20/2020, 10/06/2022   Zoster, Live 06/25/2014     Objective: Vital Signs: BP 130/82 (BP Location: Left Arm, [name] Position: Sitting, Cuff Size: Normal)   Pulse 73   Ht 5\' 11"  (1.803 m)   BMI 28.73 kg/m    Physical Exam Vitals and nursing note reviewed.  Constitutional:      Appearance: She is well-developed.  HENT:     Head: Normocephalic and atraumatic.  Eyes:     Conjunctiva/sclera: Conjunctivae normal.  Cardiovascular:     Rate and Rhythm: Normal rate and regular rhythm.     Heart sounds: Normal heart sounds.  Pulmonary:     Effort: Pulmonary effort is normal.     Breath sounds: Normal breath sounds.  Abdominal:     General:  Bowel sounds are normal.     Palpations: Abdomen is soft.  Musculoskeletal:     Cervical back: Normal range of motion.  Lymphadenopathy:     Cervical: No cervical adenopathy.  Skin:    General: Skin is warm and dry.     Capillary Refill: Capillary refill takes less than 2 seconds.  Neurological:     Mental Status: She is alert and oriented to person, place, and time.  Psychiatric:        Behavior: Behavior normal.      Musculoskeletal Exam: C-spine has limited range of motion with lateral rotation.  Shoulder joints and elbow joints have good range of motion.  Limited extension of Savannah left wrist with tenderness and  inflammation noted.  Right wrist has limited extension with some tenderness upon palpation.  PIP and DIP thickening consistent with osteoarthritis of both hands.  CMC joint thickening bilaterally.  Hip joints have good range of motion with no discomfort at this time.  Right knee replacement has good range of motion.  Left knee joint has good range of motion with no warmth or effusion.  Lymphedema noted in bilateral lower extremities, left more severe than right.  CDAI Exam: CDAI Score: -- [name] Global: --; Provider Global: -- Swollen: --; Tender: -- Joint Exam 04/16/2024   No joint exam has been documented for this visit   There is currently no information documented on Savannah homunculus. Go to [facility] Rheumatology activity and complete Savannah homunculus joint exam.  Investigation: No additional findings.  Imaging: No results found.  Recent Labs: Lab Results  Component Value Date   WBC 5.2 04/03/2023   HGB 12.1 04/03/2023   PLT 215.0 04/03/2023   NA 138 04/03/2023   K 5.0 04/03/2023   CL 107 04/03/2023   CO2 23 04/03/2023   GLUCOSE 171 (H) 04/03/2023   BUN 27 (H) 04/03/2023   CREATININE 1.78 (H) 04/03/2023   BILITOT 0.4 04/03/2023   ALKPHOS 105 04/03/2023   AST 35 04/03/2023   ALT 51 (H) 04/03/2023   PROT 6.7 04/03/2023   ALBUMIN 4.1 04/03/2023   CALCIUM  9.1  04/03/2023   GFRAA 73 08/16/2020    Speciality Comments: No specialty comments available.  Procedures:  No procedures performed Allergies: Ace inhibitors, Caffeine, Ciprofloxacin, Morphine  and codeine, Mucinex [guaifenesin er], Nsaids, Other, Silicone, and Tape   Assessment / Plan:     Visit Diagnoses: Inflammatory arthritis - RF-, anti-CCP-, 14-3-3 eta-, ANA-, HLAB27-, uric acid 3.8. Responsive to colchicine -likely she has pseudogout: [name] presents today experiencing a flare involving Savannah left wrist which started 1 week ago with no identifiable trigger.  She has not had any recent injury.  Her symptoms are most severe starting on Saturday at which time she started to take colchicine  0.6 mg 1 tablet twice daily through Monday.  Since then she has been taking colchicine  once daily due to Savannah concern for her renal function.  She has continued to have persistent pain and inflammation in Savannah left wrist.  She has been using a brace for support.  In Savannah past Savannah only thing that has helped to alleviate these flares has been a Depo-Medrol  injection and an oral prednisone  taper. [name] was evaluated by her endocrinologist yesterday at which time she told her provider that she would likely be starting on a steroid taper and have an injection due to her current flare.  According to Savannah [name] she has been educated on what to do to adjust her blood sugar if needed. A prednisone  taper starting at 20 mg tapering by 5 mg every 2 days for sent to [facility] pharmacy.  [name] was advised to hold off on initiating Savannah prednisone  taper since she will be having a Depo-Medrol  40 mg injection today. She will notify us  if her symptoms persist or worsen. - Plan: methylPREDNISolone  acetate (DEPO-MEDROL ) injection 40 mg  Medication monitoring encounter -Colchicine  0.6 mg 1 capsule daily on as needed basis.  Renal function obtained on 04/08/2024: Creatinine 1.37 and GFR 42.  Primary osteoarthritis of both hands: CMC, PIP, DIP  thickening consistent with osteoarthritis of both hands.  [name] presents today experiencing a flare on Savannah left wrist x 1 week.  No injury prior to Savannah onset of symptoms.  She has been experiencing tenderness and inflammation.  She has limited extension on examination today.  Offered to perform an ultrasound-guided left wrist injection but she requested a Depo-Medrol  40 mg injection which has helped to alleviate her symptoms in Savannah past.  Trochanteric bursitis of both hips: Intermittent discomfort.  Status post total right knee replacement: Intermittent discomfort.  Primary osteoarthritis of left knee - She is followed by Dr. Christiane Cowing. Doing well.  Pedal edema in bilateral LE. She will be establishing care with a lymphedema specialist.    Primary osteoarthritis of both feet: Pedal edema noted bilaterally.    DDD (degenerative disc disease), cervical: C-spine has limited ROM with lateral rotation.    Degeneration of intervertebral disc of lumbar region, unspecified whether pain present  Age-related osteoporosis without current pathological fracture - DEXA updated on 01/11/21: Right femoral neck- T-score -2.5 and a -25.9% change in BMD from previous DEXA. DEXA ordered and followed by PCP. She is taking vitamin D  4000 units daily.  Vitamin D  deficiency: She is taking vitamin D  4000 units daily.   Other medical conditions are listed as follows:   NASH (nonalcoholic steatohepatitis)  History of peripheral neuropathy  History of diabetes mellitus, type II  Postsurgical hypothyroidism  Positive PPD  History of hyperlipidemia  Eosinophilic esophagitis  History of uterine cancer  Status post cholecystectomy  History of gastroesophageal reflux (GERD)  Status post gastric bypass for obesity  Stage 3a chronic kidney disease (HCC) - GFR is in 40s and 50s.  She has been followed by nephrology--recent visit last week.  04/08/24: Creatinine 1.37 and GFR low-42.   Orders: No orders of Savannah  defined types were placed in this encounter.  Meds ordered this encounter  Medications   predniSONE  (DELTASONE ) 5 MG tablet    Sig: Take 4 tabs po x 2 days, 3  tabs po x 2 days, 2  tabs po x 2 days, 1  tab po x 2 days    Dispense:  20 tablet    Refill:  0   methylPREDNISolone  acetate (DEPO-MEDROL ) injection 40 mg    Follow-Up Instructions: No follow-ups on file.   Romayne Clubs, PA-C  Note - This record has been created using Dragon software.  Chart creation errors have been sought, but may not always  have been located. Such creation errors do not reflect on  Sav[place] standard of medical care.

## 2024-04-15 NOTE — Patient Instructions (Signed)
 Will recommend the following: Trulicity  4.5 mg weekly Lantus  25 units qam  Humalog  15 units with break fast and lunch, 16-18 with supper 15 min before meals  Correction scale: Use in addition to your meal time/short acting insulin  based on blood sugars as follows: 201 - 225: 3 units 226 - 250: 4 units 251 - 275: 5 units 276 - 300: 6 units 301 - 325: 7 units 326 - 350: 8 units 351 - 375: 9 units 376 - 400: 10 units

## 2024-04-16 ENCOUNTER — Ambulatory Visit: Attending: Physician Assistant | Admitting: Physician Assistant

## 2024-04-16 ENCOUNTER — Encounter: Payer: Self-pay | Admitting: Physician Assistant

## 2024-04-16 VITALS — BP 130/82 | HR 73 | Ht 71.0 in

## 2024-04-16 DIAGNOSIS — M1990 Unspecified osteoarthritis, unspecified site: Secondary | ICD-10-CM | POA: Insufficient documentation

## 2024-04-16 DIAGNOSIS — M19071 Primary osteoarthritis, right ankle and foot: Secondary | ICD-10-CM | POA: Insufficient documentation

## 2024-04-16 DIAGNOSIS — E890 Postprocedural hypothyroidism: Secondary | ICD-10-CM | POA: Diagnosis not present

## 2024-04-16 DIAGNOSIS — M19072 Primary osteoarthritis, left ankle and foot: Secondary | ICD-10-CM | POA: Insufficient documentation

## 2024-04-16 DIAGNOSIS — Z8719 Personal history of other diseases of the digestive system: Secondary | ICD-10-CM | POA: Diagnosis not present

## 2024-04-16 DIAGNOSIS — M7062 Trochanteric bursitis, left hip: Secondary | ICD-10-CM | POA: Insufficient documentation

## 2024-04-16 DIAGNOSIS — Z5181 Encounter for therapeutic drug level monitoring: Secondary | ICD-10-CM | POA: Insufficient documentation

## 2024-04-16 DIAGNOSIS — M5030 Other cervical disc degeneration, unspecified cervical region: Secondary | ICD-10-CM | POA: Diagnosis not present

## 2024-04-16 DIAGNOSIS — Z8669 Personal history of other diseases of the nervous system and sense organs: Secondary | ICD-10-CM | POA: Diagnosis not present

## 2024-04-16 DIAGNOSIS — R7611 Nonspecific reaction to tuberculin skin test without active tuberculosis: Secondary | ICD-10-CM | POA: Insufficient documentation

## 2024-04-16 DIAGNOSIS — M51369 Other intervertebral disc degeneration, lumbar region without mention of lumbar back pain or lower extremity pain: Secondary | ICD-10-CM | POA: Diagnosis not present

## 2024-04-16 DIAGNOSIS — M19042 Primary osteoarthritis, left hand: Secondary | ICD-10-CM | POA: Diagnosis not present

## 2024-04-16 DIAGNOSIS — M7061 Trochanteric bursitis, right hip: Secondary | ICD-10-CM | POA: Diagnosis not present

## 2024-04-16 DIAGNOSIS — K7581 Nonalcoholic steatohepatitis (NASH): Secondary | ICD-10-CM | POA: Insufficient documentation

## 2024-04-16 DIAGNOSIS — M1712 Unilateral primary osteoarthritis, left knee: Secondary | ICD-10-CM | POA: Insufficient documentation

## 2024-04-16 DIAGNOSIS — M19041 Primary osteoarthritis, right hand: Secondary | ICD-10-CM | POA: Diagnosis not present

## 2024-04-16 DIAGNOSIS — K200 Eosinophilic esophagitis: Secondary | ICD-10-CM | POA: Insufficient documentation

## 2024-04-16 DIAGNOSIS — Z96651 Presence of right artificial knee joint: Secondary | ICD-10-CM | POA: Diagnosis not present

## 2024-04-16 DIAGNOSIS — N1831 Chronic kidney disease, stage 3a: Secondary | ICD-10-CM | POA: Diagnosis present

## 2024-04-16 DIAGNOSIS — E559 Vitamin D deficiency, unspecified: Secondary | ICD-10-CM | POA: Diagnosis not present

## 2024-04-16 DIAGNOSIS — Z9049 Acquired absence of other specified parts of digestive tract: Secondary | ICD-10-CM | POA: Insufficient documentation

## 2024-04-16 DIAGNOSIS — Z8639 Personal history of other endocrine, nutritional and metabolic disease: Secondary | ICD-10-CM | POA: Insufficient documentation

## 2024-04-16 DIAGNOSIS — Z8542 Personal history of malignant neoplasm of other parts of uterus: Secondary | ICD-10-CM | POA: Insufficient documentation

## 2024-04-16 DIAGNOSIS — Z9884 Bariatric surgery status: Secondary | ICD-10-CM | POA: Insufficient documentation

## 2024-04-16 DIAGNOSIS — M810 Age-related osteoporosis without current pathological fracture: Secondary | ICD-10-CM | POA: Diagnosis not present

## 2024-04-16 MED ORDER — METHYLPREDNISOLONE ACETATE 40 MG/ML IJ SUSP
40.0000 mg | Freq: Once | INTRAMUSCULAR | Status: AC
  Administered 2024-04-16: 40 mg via INTRAMUSCULAR

## 2024-04-16 MED ORDER — PREDNISONE 5 MG PO TABS: ORAL_TABLET | ORAL | 0 refills | Status: DC

## 2024-04-16 NOTE — Progress Notes (Signed)
 Patient given Depo Medrol  40 mg IM per Jacinta Martinis, PA-C.  Administrations This Visit     methylPREDNISolone  acetate (DEPO-MEDROL ) injection 40 mg     Admin Date 04/16/2024 Action Given Dose 40 mg Route Intramuscular Documented By Adrianne Horn, LPN            Patient tolerated injection well.

## 2024-04-21 ENCOUNTER — Other Ambulatory Visit: Payer: Self-pay

## 2024-04-21 ENCOUNTER — Other Ambulatory Visit: Payer: Self-pay | Admitting: "Endocrinology

## 2024-04-21 DIAGNOSIS — E1165 Type 2 diabetes mellitus with hyperglycemia: Secondary | ICD-10-CM

## 2024-04-21 MED ORDER — INSULIN LISPRO (1 UNIT DIAL) 100 UNIT/ML (KWIKPEN): PEN_INJECTOR | SUBCUTANEOUS | 1 refills | Status: DC

## 2024-04-21 NOTE — Telephone Encounter (Signed)
 Requested Prescriptions   Pending Prescriptions Disp Refills   BD PEN NEEDLE MINI ULTRAFINE 31G X 5 MM MISC [Pharmacy Med Name: BD UF MINI PEN NEEDLE [id number]] 100 each 11    Sig: USE AS DIRECTED ONCE DAILY AT 6:00AM AS DIRECTED WITH LANTUS 

## 2024-04-22 DIAGNOSIS — I890 Lymphedema, not elsewhere classified: Secondary | ICD-10-CM | POA: Diagnosis not present

## 2024-04-22 DIAGNOSIS — I83893 Varicose veins of bilateral lower extremities with other complications: Secondary | ICD-10-CM | POA: Diagnosis not present

## 2024-04-22 DIAGNOSIS — M79662 Pain in left lower leg: Secondary | ICD-10-CM | POA: Diagnosis not present

## 2024-04-22 DIAGNOSIS — M79661 Pain in right lower leg: Secondary | ICD-10-CM | POA: Diagnosis not present

## 2024-04-22 DIAGNOSIS — I872 Venous insufficiency (chronic) (peripheral): Secondary | ICD-10-CM | POA: Diagnosis not present

## 2024-04-23 ENCOUNTER — Ambulatory Visit (INDEPENDENT_AMBULATORY_CARE_PROVIDER_SITE_OTHER): Admitting: "Endocrinology

## 2024-04-23 ENCOUNTER — Encounter: Payer: Self-pay | Admitting: "Endocrinology

## 2024-04-23 VITALS — BP 130/80 | HR 84 | Ht 71.0 in | Wt 208.0 lb

## 2024-04-23 DIAGNOSIS — E890 Postprocedural hypothyroidism: Secondary | ICD-10-CM | POA: Diagnosis not present

## 2024-04-23 DIAGNOSIS — E1165 Type 2 diabetes mellitus with hyperglycemia: Secondary | ICD-10-CM

## 2024-04-23 DIAGNOSIS — E7800 Pure hypercholesterolemia, unspecified: Secondary | ICD-10-CM | POA: Diagnosis not present

## 2024-04-23 DIAGNOSIS — Z7985 Long-term (current) use of injectable non-insulin antidiabetic drugs: Secondary | ICD-10-CM

## 2024-04-23 DIAGNOSIS — Z794 Long term (current) use of insulin: Secondary | ICD-10-CM | POA: Diagnosis not present

## 2024-04-23 NOTE — Progress Notes (Signed)
 Outpatient Endocrinology Note Savannah Newcomer, MD  04/23/24   Zeppelin Beckstrand Savannah [name] 1956-10-25 [id number]  Referring Provider: Rodney Clamp, MD Primary Care Provider: Rodney Clamp, MD Reason for consultation: Subjective   Assessment & Plan  Diagnoses and all orders for this visit:  Uncontrolled type 2 diabetes mellitus with hyperglycemia (HCC)  Long-term (current) use of injectable non-insulin  antidiabetic drugs  Long-term insulin  use (HCC)  Pure hypercholesterolemia  Postoperative hypothyroidism     History of thyroidectomy on levothyroxine  125 mcg po every day, takes with other meds Recommend to take levothyroxine  first thing in the morning on empty stomach and wait at least 30 minutes to 1 hour before eating or drinking anything or taking any other medications. Space out levothyroxine  by 4 hours from any acid reflux medication, fibrate, iron , calcium , multivitamin, and nutritional supplements.  02/27/24: start LT4 112 mcg po qd, TSH at 8 04/15/24: start LT4 125 mcg po every day Repeat lab in 3 mo around 07/2024  Diabetes Type II complicated by heart stents, neuropathy, nephropathy  She took insulin  from 2004-2011, when she had gastric bypass surgery Lab Results  Component Value Date   GFR 28.90 (L) 04/03/2023   Hba1c goal less than 7, current Hba1c is  Lab Results  Component Value Date   HGBA1C 8.9 (A) 10/17/2023   Will recommend the following: Trulicity  4.5 mg weekly Lantus  25 units qam  Humalog  15 units with break fast and 14 units with lunch, 16-18 with supper 15 min before meals  Correction scale: Use in addition to your meal time/short acting insulin  based on blood sugars as follows: 201 - 225: 3 units 226 - 250: 4 units 251 - 275: 5 units 276 - 300: 6 units 301 - 325: 7 units 326 - 350: 8 units 351 - 375: 9 units 376 - 400: 10 units   Stopped Repaglinide  2 mg tid - not helping  Stopped Invokana  300 mg every day-gets UTIs and has pelvic lichen  sclerosis   Has DexCom Ordered DM education previously   No known contraindications/side effects to any of above medications No history of MEN syndrome/medullary thyroid  cancer/pancreatitis or pancreatic cancer in self or family Glucagon  discussed and prescribed with refills on 04/23/24  -Last LD and Tg are as follows: Lab Results  Component Value Date   LDLCALC 58 02/20/2024    Lab Results  Component Value Date   TRIG 94 02/20/2024   -On atorvastatin  40 mg QD -Follow low fat diet and exercise   -Blood pressure goal <140/90 - Microalbumin/creatinine goal is < 30 -Last MA/Cr is as follows: Lab Results  Component Value Date   MICROALBUR 0.7 02/22/2023   -not on ACE/ARB, sees a nephrologist  -diet changes including salt restriction -limit eating outside -counseled BP targets per standards of diabetes care -uncontrolled blood pressure can lead to retinopathy, nephropathy and cardiovascular and atherosclerotic heart disease  Reviewed and counseled on: -A1C target -Blood sugar targets -Complications of uncontrolled diabetes  -Checking blood sugar before meals and bedtime and bring log next visit -All medications with mechanism of action and side effects -Hypoglycemia management: rule of 15's, Glucagon  Emergency Kit and medical alert ID -low-carb low-fat plate-method diet -At least 20 minutes of physical activity per day -Annual dilated retinal eye exam and foot exam -compliance and follow up needs -follow up as scheduled or earlier if problem gets worse  Call if blood sugar is less than 70 or consistently above 250    Take a 15 gm  snack of carbohydrate at bedtime before you go to sleep if your blood sugar is less than 100.    If you are going to fast after midnight for a test or procedure, ask your physician for instructions on how to reduce/decrease your insulin  dose.    Call if blood sugar is less than 70 or consistently above 250  -Treating a low sugar by rule of 15   (15 gms of sugar every 15 min until sugar is more than 70) If you feel your sugar is low, test your sugar to be sure If your sugar is low (less than 70), then take 15 grams of a fast acting Carbohydrate (3-4 glucose tablets or glucose gel or 4 ounces of juice or regular soda) Recheck your sugar 15 min after treating low to make sure it is more than 70 If sugar is still less than 70, treat again with 15 grams of carbohydrate          Don't drive the hour of hypoglycemia  If unconscious/unable to eat or drink by mouth, use glucagon  injection or nasal spray baqsimi  and call 911. Can repeat again in 15 min if still unconscious.  Return in about 6 weeks (around 06/04/2024) for for DM .   I have reviewed current medications, nurse's notes, allergies, vital signs, past medical and surgical history, family medical history, and social history for this encounter. Counseled patient on symptoms, examination findings, lab findings, imaging results, treatment decisions and monitoring and prognosis. The patient understood the recommendations and agrees with the treatment plan. All questions regarding treatment plan were fully answered.  Savannah Newcomer, MD  04/23/24    History of Present Illness Savannah [name] is a 68 y.o. year old female who presents for evaluation of Type II diabetes mellitus and hypothyroidism.  C/o tiredness, dry skin   Savannah [name] was first diagnosed in 2000.   Diabetes education +  Home diabetes regimen: Trulicity  4.5 mg weekly Lantus  25 units qam  Humalog  15 units with break fast and lunch, 8 with supper 15 min before meals  Correction scale: Use in addition to your meal time/short acting insulin  based on blood sugars as follows: 201 - 225: 3 units 226 - 250: 4 units 251 - 275: 5 units 276 - 300: 6 units 301 - 325: 7 units 326 - 350: 8 units 351 - 375: 9 units 376 - 400: 10 units   COMPLICATIONS - MI/Stroke, + angina s/p heart stents  -  retinopathy +  neuropathy +   nephropathy  BLOOD SUGAR DATA 74-76 lowest per BG meter  CGM interpretation: At today's visit, we reviewed her CGM downloads. The full report is scanned in the media. Reviewing the CGM trends, BG are elevated everyday with 48% normal.  Physical Exam  BP 130/80   Pulse 84   Ht 5' 11 (1.803 m)   Wt 208 lb (94.3 kg)   SpO2 95%   BMI 29.01 kg/m    Constitutional: well developed, well nourished Head: normocephalic, atraumatic Eyes: sclera anicteric, no redness Neck: supple Lungs: normal respiratory effort Neurology: alert and oriented Skin: dry, no appreciable rashes Musculoskeletal: no appreciable defects Psychiatric: normal mood and affect Diabetic Foot Exam - Simple   No data filed      Current Medications Patient's Medications  New Prescriptions   No medications on file  Previous Medications   ACETAMINOPHEN  (TYLENOL ) 500 MG TABLET    Take 2 tablets (1,000 mg total) by mouth every 6 (six)  hours as needed for mild pain or fever.   ATORVASTATIN  (LIPITOR) 40 MG TABLET    Take 1 tablet (40 mg total) by mouth daily.   BLOOD GLUCOSE METER KIT AND SUPPLIES KIT    Use up to three  times daily as directed.   BLOOD GLUCOSE MONITORING SUPPL DEVI    1 each by Does not apply route in the morning, at noon, and at bedtime. May substitute to any manufacturer covered by patient's insurance.   CETIRIZINE  (ZYRTEC ) 10 MG TABLET    TAKE 1 TABLET BY MOUTH EVERY DAY   CHOLECALCIFEROL (VITAMIN D ) 50 MCG (2000 UT) TABLET    Take 4,000 Units by mouth in the morning.   CLOPIDOGREL  (PLAVIX ) 75 MG TABLET    Take 1 tablet (75 mg total) by mouth daily.   CLOTRIMAZOLE  (MYCELEX ) 10 MG TROCHE    Take 1 tablet (10 mg total) by mouth 5 (five) times daily.   COLCHICINE  (MITIGARE ) 0.6 MG CAPS    Take by mouth as needed.   CONTINUOUS GLUCOSE SENSOR (DEXCOM G7 SENSOR) MISC    USE AS DIRECTED   CYCLOBENZAPRINE  (FLEXERIL ) 10 MG TABLET    Take 10 mg by mouth at bedtime as needed for muscle spasms.   DICLOFENAC   SODIUM (VOLTAREN ) 1 % GEL    Apply 4 g topically 4 (four) times daily.   DOCUSATE SODIUM (COLACE) 100 MG CAPSULE    Take 100 mg by mouth 2 (two) times daily.   DULAGLUTIDE  (TRULICITY ) 4.5 MG/0.5ML SOAJ    INJECT 4.5 MG AS DIRECTED ONCE A WEEK.   FLUCONAZOLE  (DIFLUCAN ) 200 MG TABLET    Take 200 mg by mouth as needed.   FLUOROMETHOLONE (FML) 0.1 % OPHTHALMIC SUSPENSION    Place 1 drop into both eyes as needed.   FLUTICASONE  (FLONASE ) 50 MCG/ACT NASAL SPRAY    SPRAY 2 SPRAYS INTO EACH NOSTRIL EVERY DAY   GABAPENTIN  (NEURONTIN ) 300 MG CAPSULE    Take 300 mg by mouth 3 (three) times daily.   GABAPENTIN  (NEURONTIN ) 600 MG TABLET    Take 600 mg by mouth 2 (two) times daily.   GLUCAGON  (BAQSIMI  ONE PACK) 3 MG/DOSE POWD    Place 1 Device into the nose as needed (Low blood sugar with impaired consciousness).   HALOBETASOL & LACTIC ACID (ULTRAVATE X, OINTMENT, EX)    Apply 1 Application topically 2 (two) times daily.   HYDROCODONE -ACETAMINOPHEN  (NORCO/VICODIN) 5-325 MG TABLET    Take 1 tablet by mouth as needed for moderate pain (pain score 4-6).   INSULIN  GLARGINE (LANTUS  SOLOSTAR) 100 UNIT/ML SOLOSTAR PEN    Inject 18 Units into the skin daily. Take 18 units qam   INSULIN  LISPRO (HUMALOG  KWIKPEN) 100 UNIT/ML KWIKPEN    Inject 15 units with break fast and lunch, 16-18 with supper 15 min before meals.   INSULIN  PEN NEEDLE (BD PEN NEEDLE MINI ULTRAFINE) 31G X 5 MM MISC    Use as directed with insulin .   IPRATROPIUM (ATROVENT) 0.06 % NASAL SPRAY    Place 1 spray into both nostrils as needed.   LEVALBUTEROL  (XOPENEX  HFA) 45 MCG/ACT INHALER    Inhale 1 puff into the lungs every 4 (four) hours as needed for wheezing.   LEVOCETIRIZINE (XYZAL ) 5 MG TABLET    Take 1 tablet (5 mg total) by mouth every evening.   LEVOTHYROXINE  (SYNTHROID ) 125 MCG TABLET    Take 1 tablet (125 mcg total) by mouth daily.   LIDOCAINE  (LIDODERM ) 5 %  PLACE 1 PATCH ONTO THE SKIN DAILY. REMOVE & DISCARD PATCH WITHIN 12 HOURS OR AS DIRECTED  BY MD   MAGNESIUM PO    Take 3 tablets by mouth daily as needed (foot cramps).   METHOCARBAMOL (ROBAXIN) 750 MG TABLET    Take 750 mg by mouth every 6 (six) hours as needed.   MULTIPLE VITAMIN (MULTIVITAMIN) TABLET    Take 1 tablet by mouth in the morning and at bedtime.   NITROGLYCERIN  (NITROSTAT ) 0.4 MG SL TABLET    Place 1 tablet (0.4 mg total) under the tongue every 5 (five) minutes as needed for chest pain.   PANTOPRAZOLE  (PROTONIX ) 40 MG TABLET    Take 1 tablet (40 mg total) by mouth 2 (two) times daily.   PHENAZOPYRIDINE  (PYRIDIUM ) 100 MG TABLET    Take 1 tablet (100 mg total) by mouth 3 (three) times daily with meals.   PREDNISONE  (DELTASONE ) 5 MG TABLET    Take 4 tabs po x 2 days, 3  tabs po x 2 days, 2  tabs po x 2 days, 1  tab po x 2 days   REPAGLINIDE  (PRANDIN ) 2 MG TABLET    Take 1 tablet (2 mg total) by mouth 3 (three) times daily before meals.   TRAMADOL  (ULTRAM ) 50 MG TABLET    Take 50 mg by mouth every 6 (six) hours as needed (pain).  Modified Medications   No medications on file  Discontinued Medications   No medications on file    Allergies Allergies  Allergen Reactions   Ace Inhibitors Cough   Caffeine     PVCs   Ciprofloxacin Itching   Morphine  And Codeine Itching    Pt tolerates medication if given with diphenhydramine    Mucinex [Guaifenesin Er]     PVCs   Nsaids     Gastric Bypass Surgery - unable to take, tolerates ec 81 aspirin     Other     Antihistamine-alkylamine - PVCs tolerates benadryl     Silicone Hives   Tape Hives    Past Medical History Past Medical History:  Diagnosis Date   ABDOMINAL PAIN, CHRONIC 10/20/2009   Acute cystitis 08/17/2009   ALLERGIC RHINITIS 11/21/2007   Allergy     ASYMPTOMATIC POSTMENOPAUSAL STATUS 09/29/2008   Bariatric surgery status 07/07/2010   Bariatric surgery status 07/07/2010   Qualifier: Diagnosis of  By: Washington Hacker MD, Sean A    Cancer [facility])    uterine   Cataract    Coronary artery disease    DEPRESSION  11/21/2007   DIABETES MELLITUS, TYPE II 07/08/2007   DYSPNEA 05/20/2009   Edema 05/20/2009   Eosinophilic esophagitis 02/13/2008   FEVER UNSPECIFIED 10/20/2009   FEVER, HX OF 03/09/2010   GERD (gastroesophageal reflux disease)    Headache(784.0) 02/06/2010   Hepatomegaly 01/06/2008   HYPERLIPIDEMIA 07/08/2007   HYPERTENSION 07/08/2007   Kidney disease    followed by nephrology, per patient   LEUKOPENIA, MILD 09/29/2008   Lymphedema    left leg, dx approximately in 03/2023 per patient   Melanoma in situ of left upper arm (HCC) 04/03/2023   OBESITY 01/06/2008   OSTEOARTHRITIS 01/06/2008   Other chronic nonalcoholic liver disease 01/06/2008   PERIPHERAL NEUROPATHY 11/21/2007   Peripheral neuropathy    Postsurgical hypothyroidism 09/19/2010   SINUSITIS- ACUTE-NOS 11/21/2007   TB SKIN TEST, POSITIVE 02/06/2010   THYROID  NODULE 03/09/2010    Past Surgical History Past Surgical History:  Procedure Laterality Date   ABDOMINAL HYSTERECTOMY     BASAL CELL CARCINOMA EXCISION  CARDIAC CATHETERIZATION     CHOLECYSTECTOMY N/A 05/11/2021   Procedure: LAPAROSCOPIC CHOLECYSTECTOMY WITH POSSIBLE  INTRAOPERATIVE CHOLANGIOGRAM;  Surgeon: Jacolyn Matar, MD;  Location: [facility];  Service: General;  Laterality: N/A;   COLONOSCOPY     CORONARY ATHERECTOMY N/A 05/03/2022   Procedure: CORONARY ATHERECTOMY;  Surgeon: Kyra Phy, MD;  Location: [facility];  Service: Cardiovascular;  Laterality: N/A;   CORONARY IMAGING/OCT N/A 05/03/2022   Procedure: INTRAVASCULAR IMAGING/OCT;  Surgeon: Kyra Phy, MD;  Location: [facility];  Service: Cardiovascular;  Laterality: N/A;   CORONARY PRESSURE/FFR STUDY N/A 05/03/2022   Procedure: INTRAVASCULAR PRESSURE WIRE/FFR STUDY;  Surgeon: Kyra Phy, MD;  Location: [facility];  Service: Cardiovascular;  Laterality: N/A;   CORONARY STENT INTERVENTION N/A 05/03/2022   Procedure: CORONARY STENT INTERVENTION;  Surgeon:  Kyra Phy, MD;  Location: [facility];  Service: Cardiovascular;  Laterality: N/A;  lad   EYE SURGERY Bilateral 08/2018, 09/2018   FOOT SURGERY Left    10/2019   KNEE ARTHROSCOPY Left    LEFT HEART CATH AND CORONARY ANGIOGRAPHY N/A 01/20/2019   Procedure: LEFT HEART CATH AND CORONARY ANGIOGRAPHY;  Surgeon: Arty Binning, MD;  Location: [facility];  Service: Cardiovascular;  Laterality: N/A;   LYMPH NODE DISSECTION     OOPHORECTOMY     Right total knee replacement     RIGHT/LEFT HEART CATH AND CORONARY ANGIOGRAPHY N/A 05/03/2022   Procedure: RIGHT/LEFT HEART CATH AND CORONARY ANGIOGRAPHY;  Surgeon: Kyra Phy, MD;  Location: [facility];  Service: Cardiovascular;  Laterality: N/A;   ROUX-EN-Y GASTRIC BYPASS  2011   SKIN TAG REMOVAL     THYROIDECTOMY     TONSILLECTOMY AND ADENOIDECTOMY     TOOTH EXTRACTION  2023   UPPER GASTROINTESTINAL ENDOSCOPY     UPPER GI ENDOSCOPY  2024    Family History family history includes Bladder Cancer in her sister; Breast cancer in her maternal aunt and sister; Colon cancer in her maternal uncle; Congestive Heart Failure in her father; Diabetes in her father, maternal aunt, maternal grandmother, maternal uncle, mother, and paternal grandmother; Hypertension in her father; Liver cancer in her maternal aunt; Multiple sclerosis in her sister; Myelodysplastic syndrome in her father; Neuropathy in her mother and paternal aunt; Osteoporosis in her sister.  Social History Social History   Socioeconomic History   Marital status: Single    Spouse name: Not on file   Number of children: 0   Years of education: 16   Highest education level: Bachelor's degree (e.g., BA, AB, BS)  Occupational History   Occupation: Research scientist (medical)   Occupation: Retired  Tobacco Use   Smoking status: Former    Current packs/day: 0.00    Average packs/day: 1.5 packs/day for 15.0 years (22.5 ttl pk-yrs)    Types: Cigarettes    Start date: 41     Quit date: 1992    Years since quitting: 33.4    Passive exposure: Never   Smokeless tobacco: Never  Vaping Use   Vaping status: Never Used  Substance and Sexual Activity   Alcohol use: Yes    Comment: rarely   Drug use: Never   Sexual activity: Not Currently  Other Topics Concern   Not on file  Social History Narrative   Not on file   Social Drivers of Health   Financial Resource Strain: Low Risk  (11/22/2023)   Overall Financial Resource Strain (CARDIA)  Difficulty of Paying Living Expenses: Not hard at all  Food Insecurity: No Food Insecurity (11/22/2023)   Hunger Vital Sign    Worried About Running Out of Food in the Last Year: Never true    Ran Out of Food in the Last Year: Never true  Transportation Needs: No Transportation Needs (11/22/2023)   PRAPARE - Administrator, Civil Service (Medical): No    Lack of Transportation (Non-Medical): No  Physical Activity: Unknown (11/22/2023)   Exercise Vital Sign    Days of Exercise per Week: 0 days    Minutes of Exercise per Session: Not on file  Stress: No Stress Concern Present (11/22/2023)   [employer] of Occupational Health - Occupational Stress Questionnaire    Feeling of Stress : Not at all  Social Connections: Moderately Integrated (11/22/2023)   Social Connection and Isolation Panel    Frequency of Communication with Friends and Family: Three times a week    Frequency of Social Gatherings with Friends and Family: Three times a week    Attends Religious Services: More than 4 times per year    Active Member of Clubs or Organizations: Yes    Attends Banker Meetings: More than 4 times per year    Marital Status: Never married  Intimate Partner Violence: Not At Risk (05/19/2020)   Humiliation, Afraid, Rape, and Kick questionnaire    Fear of Current or Ex-Partner: No    Emotionally Abused: No    Physically Abused: No    Sexually Abused: No    Lab Results  Component Value Date   HGBA1C 8.9  (A) 10/17/2023   HGBA1C 7.1 (H) 02/22/2023   HGBA1C 7.5 (A) 10/15/2022   Lab Results  Component Value Date   CHOL 120 02/20/2024   Lab Results  Component Value Date   HDL 44 (L) 02/20/2024   Lab Results  Component Value Date   LDLCALC 58 02/20/2024   Lab Results  Component Value Date   TRIG 94 02/20/2024   Lab Results  Component Value Date   CHOLHDL 2.7 02/20/2024   Lab Results  Component Value Date   CREATININE 1.78 (H) 04/03/2023   Lab Results  Component Value Date   GFR 28.90 (L) 04/03/2023   Lab Results  Component Value Date   MICROALBUR 0.7 02/22/2023      Component Value Date/Time   NA 138 04/03/2023 1631   NA 141 05/01/2022 0943   K 5.0 04/03/2023 1631   CL 107 04/03/2023 1631   CO2 23 04/03/2023 1631   GLUCOSE 171 (H) 04/03/2023 1631   BUN 27 (H) 04/03/2023 1631   BUN 22 05/01/2022 0943   CREATININE 1.78 (H) 04/03/2023 1631   CREATININE 1.51 (H) 02/22/2023 1513   CALCIUM  9.1 04/03/2023 1631   CALCIUM  8.9 01/30/2013 1017   PROT 6.7 04/03/2023 1631   PROT 5.8 (L) 10/09/2021 1214   ALBUMIN 4.1 04/03/2023 1631   ALBUMIN 4.0 10/09/2021 1214   AST 35 04/03/2023 1631   AST 25 02/29/2020 1303   ALT 51 (H) 04/03/2023 1631   ALT 37 02/29/2020 1303   ALKPHOS 105 04/03/2023 1631   BILITOT 0.4 04/03/2023 1631   BILITOT 0.4 10/09/2021 1214   BILITOT 0.4 02/29/2020 1303   GFRNONAA >60 05/13/2021 0645   GFRNONAA 63 08/16/2020 1207   GFRAA 73 08/16/2020 1207      Latest Ref Rng & Units 04/03/2023    4:31 PM 02/22/2023    3:13 PM 11/20/2022  3:15 PM  BMP  Glucose 70 - 99 mg/dL 213  086  578   BUN 6 - 23 mg/dL 27  19  17    Creatinine 0.40 - 1.20 mg/dL 4.69  6.29  5.28   BUN/Creat Ratio 6 - 22 (calc)  13    Sodium 135 - 145 mEq/L 138  139  143   Potassium 3.5 - 5.1 mEq/L 5.0  4.9  4.7   Chloride 96 - 112 mEq/L 107  109  110   CO2 19 - 32 mEq/L 23  24  24    Calcium  8.4 - 10.5 mg/dL 9.1  8.7  8.9        Component Value Date/Time   WBC 5.2 04/03/2023  1631   RBC 3.83 (L) 04/03/2023 1631   HGB 12.1 04/03/2023 1631   HGB 13.6 05/01/2022 0943   HCT 37.9 04/03/2023 1631   HCT 40.5 05/01/2022 0943   PLT 215.0 04/03/2023 1631   PLT 187 05/01/2022 0943   MCV 99.0 04/03/2023 1631   MCV 96 05/01/2022 0943   MCH 31.8 02/22/2023 1513   MCHC 31.9 04/03/2023 1631   RDW 14.6 04/03/2023 1631   RDW 13.0 05/01/2022 0943   LYMPHSABS 1.4 04/03/2023 1631   LYMPHSABS 1.2 05/01/2022 0943   MONOABS 0.4 04/03/2023 1631   EOSABS 0.4 04/03/2023 1631   EOSABS 0.3 05/01/2022 0943   BASOSABS 0.0 04/03/2023 1631   BASOSABS 0.0 05/01/2022 0943     Parts of this note may have been dictated using voice recognition software. There may be variances in spelling and vocabulary which are unintentional. Not all errors are proofread. Please notify the [facility] if any discrepancies are noted or if the meaning of any statement is not clear.

## 2024-04-23 NOTE — Telephone Encounter (Signed)
 Requested Prescriptions   Signed Prescriptions Disp Refills   Insulin  Pen Needle (BD PEN NEEDLE MINI ULTRAFINE) 31G X 5 MM MISC 100 each 11    Sig: Use as directed with insulin .    Authorizing Provider: [name]    Ordering User: Waneta Gut

## 2024-05-04 ENCOUNTER — Encounter: Payer: Self-pay | Admitting: "Endocrinology

## 2024-05-07 ENCOUNTER — Ambulatory Visit (INDEPENDENT_AMBULATORY_CARE_PROVIDER_SITE_OTHER): Admitting: Family Medicine

## 2024-05-07 ENCOUNTER — Encounter: Payer: Self-pay | Admitting: Family Medicine

## 2024-05-07 VITALS — BP 122/75 | HR 74 | Temp 97.3°F | Ht 71.0 in | Wt 210.6 lb

## 2024-05-07 DIAGNOSIS — E1151 Type 2 diabetes mellitus with diabetic peripheral angiopathy without gangrene: Secondary | ICD-10-CM

## 2024-05-07 DIAGNOSIS — E611 Iron deficiency: Secondary | ICD-10-CM

## 2024-05-07 DIAGNOSIS — Z794 Long term (current) use of insulin: Secondary | ICD-10-CM | POA: Diagnosis not present

## 2024-05-07 DIAGNOSIS — E890 Postprocedural hypothyroidism: Secondary | ICD-10-CM

## 2024-05-07 DIAGNOSIS — D509 Iron deficiency anemia, unspecified: Secondary | ICD-10-CM

## 2024-05-07 DIAGNOSIS — E559 Vitamin D deficiency, unspecified: Secondary | ICD-10-CM

## 2024-05-07 DIAGNOSIS — R519 Headache, unspecified: Secondary | ICD-10-CM

## 2024-05-07 DIAGNOSIS — F320 Major depressive disorder, single episode, mild: Secondary | ICD-10-CM | POA: Diagnosis not present

## 2024-05-07 LAB — CBC
HCT: 34.1 % — ABNORMAL LOW (ref 36.0–46.0)
Hemoglobin: 11.1 g/dL — ABNORMAL LOW (ref 12.0–15.0)
MCHC: 32.5 g/dL (ref 30.0–36.0)
MCV: 95.7 fl (ref 78.0–100.0)
Platelets: 179 K/uL (ref 150.0–400.0)
RBC: 3.56 Mil/uL — ABNORMAL LOW (ref 3.87–5.11)
RDW: 14.8 % (ref 11.5–15.5)
WBC: 4.7 K/uL (ref 4.0–10.5)

## 2024-05-07 LAB — VITAMIN D 25 HYDROXY (VIT D DEFICIENCY, FRACTURES): VITD: 52.46 ng/mL (ref 30.00–100.00)

## 2024-05-07 LAB — C-REACTIVE PROTEIN: CRP: <1 mg/dL (ref 0.5–20.0)

## 2024-05-07 LAB — MICROALBUMIN / CREATININE URINE RATIO
Creatinine,U: 22.6 mg/dL
Microalb Creat Ratio: 33.1 mg/g — ABNORMAL HIGH (ref 0.0–30.0)
Microalb, Ur: 0.7 mg/dL (ref 0.0–1.9)

## 2024-05-07 LAB — COMPREHENSIVE METABOLIC PANEL WITH GFR
ALT: 75 U/L — ABNORMAL HIGH (ref 0–35)
AST: 52 U/L — ABNORMAL HIGH (ref 0–37)
Albumin: 4 g/dL (ref 3.5–5.2)
Alkaline Phosphatase: 173 U/L — ABNORMAL HIGH (ref 39–117)
BUN: 17 mg/dL (ref 6–23)
CO2: 25 meq/L (ref 19–32)
Calcium: 8.4 mg/dL (ref 8.4–10.5)
Chloride: 111 meq/L (ref 96–112)
Creatinine, Ser: 1.29 mg/dL — ABNORMAL HIGH (ref 0.40–1.20)
GFR: 42.2 mL/min — ABNORMAL LOW
Glucose, Bld: 78 mg/dL (ref 70–99)
Potassium: 4.1 meq/L (ref 3.5–5.1)
Sodium: 142 meq/L (ref 135–145)
Total Bilirubin: 0.3 mg/dL (ref 0.2–1.2)
Total Protein: 6.3 g/dL (ref 6.0–8.3)

## 2024-05-07 LAB — IBC + FERRITIN
Ferritin: 7.7 ng/mL — ABNORMAL LOW (ref 10.0–291.0)
Iron: 41 ug/dL — ABNORMAL LOW (ref 42–145)
Saturation Ratios: 8.3 % — ABNORMAL LOW (ref 20.0–50.0)
TIBC: 492.8 ug/dL — ABNORMAL HIGH (ref 250.0–450.0)
Transferrin: 352 mg/dL (ref 212.0–360.0)

## 2024-05-07 LAB — SEDIMENTATION RATE: Sed Rate: 7 mm/h (ref 0–30)

## 2024-05-07 LAB — VITAMIN B12: Vitamin B-12: 1066 pg/mL — ABNORMAL HIGH (ref 211–911)

## 2024-05-07 NOTE — Assessment & Plan Note (Signed)
 Follows with endocrinology for this.  Did have a elevation in her TSH recently however has since increased her Synthroid  to 125 mcg daily.  Do not need to recheck TSH today.

## 2024-05-07 NOTE — Progress Notes (Signed)
 Savannah [name] is a 68 y.o. female who presents today for an office visit.  Assessment/Plan:  New/Acute Problems: Headache  Overall reassuring exam today however it is concerning as she has had near daily headaches for the last few months.  It is possible that this may be related to her head trauma few months ago and may have postconcussive syndrome however due to age would be reasonable to pursue further workup at this point.  We will check brain MRI.  Also check labs including CBC, c-Met, sed rate, and CRP.  Depending on results may need referral to neurology.  Chronic Problems Addressed Today: Postsurgical hypothyroidism Follows with endocrinology for this.  Did have a elevation in her TSH recently however has since increased her Synthroid  to 125 mcg daily.  Do not need to recheck TSH today.  Type 2 diabetes mellitus with diabetic peripheral angiopathy without gangrene, with long-term current use of insulin  [facility]) Also follows with endocrinology for this.  She is currently on Trulicity , Invokana , and insulin .  Depression Elevated PHQ score today.  This is multifactorial in setting of potential recent concussion and hypothyroidism.  She has been treated for depression in the past however did not have much benefit with several antidepressants.  We did discuss restarting medications today however she would like to hold off on this.  She is agreeable to see a therapist.  Will place referral today though we are also addressing her other issues as above.  She will let us  know if she changes her mind about medications.  Iron  deficiency anemia Check CBC and iron  panel.     Subjective:  HPI:  See A/P for status of chronic conditions.  Patient here to discuss headache.  This is been persistent and ongoing for the last 5 months or so.  She is having nearly daily headaches.  She believes that her symptoms started after having been struck on the top of her head several months ago by a metal bar.   Patient states that she was cleaning snow off of her awning.  While doing this a metal bar slipped and struck her directly on top of the head.  She did not lose consciousness at that time but did have pain to the area.  A few days prior to this she noted that she hit the right side of her head on a cabinet in a few days after also struck the top of her head on the car door.  Her headache is typically located across frontal forehead.  Happens nearly every day.  She has to avoid NSAIDs but does occasionally get tramadol  which helps.  She has not noticed any aggravating or relieving factors.  No reported weakness or numbness.  No reported vision changes I will be following up with her ophthalmologist soon.    She has also had decreased mood for the last several months.  She believes that this may be multifactorial and has had a history of depression in the past however had several medications for this which were not effective.       Objective:  Physical Exam: BP 122/75   Pulse 74   Temp (!) 97.3 F (36.3 C) (Temporal)   Ht 5' 11 (1.803 m)   Wt 210 lb 9.6 oz (95.5 kg)   SpO2 100%   BMI 29.37 kg/m   Gen: No acute distress, resting comfortably HEENT: Left subconjunctival hemorrhage noted.  TMs clear bilaterally.  OP clear. CV: Regular rate and rhythm with no murmurs  appreciated Pulm: Normal work of breathing, clear to auscultation bilaterally with no crackles, wheezes, or rhonchi Neuro: Cranial nerves II through XII intact.  Finger-nose-finger testing intact bilaterally.  Strength 5 out of 5 in upper and lower extremities.  Reflexes 2+ and symmetric bilaterally Psych: Normal affect and thought content      Madine Sarr M. Kennyth, MD 05/07/2024 12:55 PM

## 2024-05-07 NOTE — Assessment & Plan Note (Signed)
 Also follows with endocrinology for this.  She is currently on Trulicity , Invokana , and insulin .

## 2024-05-07 NOTE — Patient Instructions (Signed)
 It was very nice to see you today!  We will check blood work today.  I will also order an MRI of your brain.  We will refer you to see a therapist.  Let us  know how you are doing in a few weeks.  Will contact you as we get results back on your tests.  Return if symptoms worsen or fail to improve.   Take care, Dr Kennyth  PLEASE NOTE:  If you had any lab tests, please let us  know if you have not heard back within a few days. You may see your results on mychart before we have a chance to review them but we will give you a call once they are reviewed by us .   If we ordered any referrals today, please let us  know if you have not heard from their office within the next week.   If you had any urgent prescriptions sent in today, please check with the pharmacy within an hour of our visit to make sure the prescription was transmitted appropriately.   Please try these tips to maintain a healthy lifestyle:  Eat at least 3 REAL meals and 1-2 snacks per day.  Aim for no more than 5 hours between eating.  If you eat breakfast, please do so within one hour of getting up.   Each meal should contain half fruits/vegetables, one quarter protein, and one quarter carbs (no bigger than a computer mouse)  Cut down on sweet beverages. This includes juice, soda, and sweet tea.   Drink at least 1 glass of water with each meal and aim for at least 8 glasses per day  Exercise at least 150 minutes every week.

## 2024-05-07 NOTE — Assessment & Plan Note (Signed)
 Elevated PHQ score today.  This is multifactorial in setting of potential recent concussion and hypothyroidism.  She has been treated for depression in the past however did not have much benefit with several antidepressants.  We did discuss restarting medications today however she would like to hold off on this.  She is agreeable to see a therapist.  Will place referral today though we are also addressing her other issues as above.  She will let us  know if she changes her mind about medications.

## 2024-05-07 NOTE — Assessment & Plan Note (Signed)
 Check CBC and iron panel.

## 2024-05-08 ENCOUNTER — Ambulatory Visit: Attending: General Practice | Admitting: General Practice

## 2024-05-08 ENCOUNTER — Ambulatory Visit: Payer: Self-pay | Admitting: Family Medicine

## 2024-05-08 ENCOUNTER — Encounter: Payer: Self-pay | Admitting: General Practice

## 2024-05-08 VITALS — BP 121/78 | HR 78 | Ht 71.0 in | Wt 212.2 lb

## 2024-05-08 DIAGNOSIS — E785 Hyperlipidemia, unspecified: Secondary | ICD-10-CM | POA: Insufficient documentation

## 2024-05-08 DIAGNOSIS — I5032 Chronic diastolic (congestive) heart failure: Secondary | ICD-10-CM | POA: Insufficient documentation

## 2024-05-08 DIAGNOSIS — I2510 Atherosclerotic heart disease of native coronary artery without angina pectoris: Secondary | ICD-10-CM | POA: Diagnosis not present

## 2024-05-08 DIAGNOSIS — R0600 Dyspnea, unspecified: Secondary | ICD-10-CM | POA: Diagnosis not present

## 2024-05-08 NOTE — Patient Instructions (Addendum)
 Medication Instructions:  NO CHANGES *If you need a refill on your cardiac medications before your next appointment, please call your pharmacy*  Lab Work: NO LABS If you have labs (blood work) drawn today and your tests are completely normal, you will receive your results only by: MyChart Message (if you have MyChart) OR A paper copy in the mail If you have any lab test that is abnormal or we need to change your treatment, we will call you to review the results.  Testing/Procedures:[address] PROVIDER WOULD LIKE ECHO RESCHEDULED SOONER WITHIN 1 WEEK  Follow-Up: At [facility], you and your health needs are our priority.  As part of our continuing mission to provide you with exceptional heart care, our providers are all part of one team.  This team includes your primary Cardiologist (physician) and Advanced Practice Providers or APPs (Physician Assistants and Nurse Practitioners) who all work together to provide you with the care you need, when you need it.  Your next appointment:   FOLLOW UP AFTER ECHO   Provider:   Gayatri A Acharya, MD   Other Instructions INCREASE PHYSICAL ACTIVITY AS TOLERATED  Josefa Beauvais, NP recommends you purchase some compression  socks/hose from [facility] in [place] ,[place]. You do not need an prescription to purchase the items.  Address  [address] [place], [place] [address]  Phone  [phone]   Compression   strength    8-15 mmHg15-20 mmHg                     20-30 mmHg 30-40 mmHg  You may also try a medical supply store, department store (i.e.- [employer], [employer], [employer], specialty shoe stores ( shoe market), [employer] and [employer]) or  [employer] uniform store.  ALSO RECOMMENDS FOLLOWING LOW SODIUM DIET AND IRON  RICH DIET. PLEASE READ INFORMATION SHEET GIVEN

## 2024-05-08 NOTE — Progress Notes (Signed)
 Cardiology Clinic Note   Patient Name: Savannah [name] Date of Encounter: 05/08/2024  Primary Care Provider:  Kennyth Worth HERO, MD Primary Cardiologist:  Soyla DELENA Merck, MD  Patient Profile    Savannah [name] presents to the clinic today for evaluation of her shortness of breath.  Past Medical History    Past Medical History:  Diagnosis Date   ABDOMINAL PAIN, CHRONIC 10/20/2009   Acute cystitis 08/17/2009   ALLERGIC RHINITIS 11/21/2007   Allergy     ASYMPTOMATIC POSTMENOPAUSAL STATUS 09/29/2008   Bariatric surgery status 07/07/2010   Bariatric surgery status 07/07/2010   Qualifier: Diagnosis of  By: Kassie MD, Sean A    Cancer [facility])    uterine   Cataract    Coronary artery disease    DEPRESSION 11/21/2007   DIABETES MELLITUS, TYPE II 07/08/2007   DYSPNEA 05/20/2009   Edema 05/20/2009   Eosinophilic esophagitis 02/13/2008   FEVER UNSPECIFIED 10/20/2009   FEVER, HX OF 03/09/2010   GERD (gastroesophageal reflux disease)    Headache(784.0) 02/06/2010   Hepatomegaly 01/06/2008   HYPERLIPIDEMIA 07/08/2007   HYPERTENSION 07/08/2007   Kidney disease    followed by nephrology, per patient   LEUKOPENIA, MILD 09/29/2008   Lymphedema    left leg, dx approximately in 03/2023 per patient   Melanoma in situ of left upper arm (HCC) 04/03/2023   OBESITY 01/06/2008   OSTEOARTHRITIS 01/06/2008   Other chronic nonalcoholic liver disease 01/06/2008   PERIPHERAL NEUROPATHY 11/21/2007   Peripheral neuropathy    Postsurgical hypothyroidism 09/19/2010   SINUSITIS- ACUTE-NOS 11/21/2007   TB SKIN TEST, POSITIVE 02/06/2010   THYROID  NODULE 03/09/2010   Past Surgical History:  Procedure Laterality Date   ABDOMINAL HYSTERECTOMY     BASAL CELL CARCINOMA EXCISION     CARDIAC CATHETERIZATION     CHOLECYSTECTOMY N/A 05/11/2021   Procedure: LAPAROSCOPIC CHOLECYSTECTOMY WITH POSSIBLE  INTRAOPERATIVE CHOLANGIOGRAM;  Surgeon: Gladis Cough, MD;  Location: [facility];  Service: General;   Laterality: N/A;   COLONOSCOPY     CORONARY ATHERECTOMY N/A 05/03/2022   Procedure: CORONARY ATHERECTOMY;  Surgeon: Wendel Lurena POUR, MD;  Location: [facility] CV [facility];  Service: Cardiovascular;  Laterality: N/A;   CORONARY IMAGING/OCT N/A 05/03/2022   Procedure: INTRAVASCULAR IMAGING/OCT;  Surgeon: Wendel Lurena POUR, MD;  Location: [facility]IVE CV LAB;  Service: Cardiovascular;  Laterality: N/A;   CORONARY PRESSURE/FFR STUDY N/A 05/03/2022   Procedure: INTRAVASCULAR PRESSURE WIRE/FFR STUDY;  Surgeon: Wendel Lurena POUR, MD;  Location: [facility] CV LAB;  Service: Cardiovascular;  Laterality: N/A;   CORONARY STENT INTERVENTION N/A 05/03/2022   Procedure: CORONARY STENT INTERVENTION;  Surgeon: Wendel Lurena POUR, MD;  Location: [facility];  Service: Cardiovascular;  Laterality: N/A;  lad   EYE SURGERY Bilateral 08/2018, 09/2018   FOOT SURGERY Left    10/2019   KNEE ARTHROSCOPY Left    LEFT HEART CATH AND CORONARY ANGIOGRAPHY N/A 01/20/2019   Procedure: LEFT HEART CATH AND CORONARY ANGIOGRAPHY;  Surgeon: Claudene Victory ORN, MD;  Location: [facility];  Service: Cardiovascular;  Laterality: N/A;   LYMPH NODE DISSECTION     OOPHORECTOMY     Right total knee replacement     RIGHT/LEFT HEART CATH AND CORONARY ANGIOGRAPHY N/A 05/03/2022   Procedure: RIGHT/LEFT HEART CATH AND CORONARY ANGIOGRAPHY;  Surgeon: Wendel Lurena POUR, MD;  Location: [facility];  Service: Cardiovascular;  Laterality: N/A;   ROUX-EN-Y GASTRIC BYPASS  2011   SKIN TAG REMOVAL  THYROIDECTOMY     TONSILLECTOMY AND ADENOIDECTOMY     TOOTH EXTRACTION  2023   UPPER GASTROINTESTINAL ENDOSCOPY     UPPER GI ENDOSCOPY  2024    Allergies  Allergies  Allergen Reactions   Ace Inhibitors Cough   Caffeine     PVCs   Ciprofloxacin Itching   Morphine  And Codeine Itching    Pt tolerates medication if given with diphenhydramine    Mucinex [Guaifenesin Er]     PVCs   Nsaids     Gastric Bypass Surgery - unable to  take, tolerates ec 81 aspirin     Other     Antihistamine-alkylamine - PVCs tolerates benadryl     Silicone Hives   Tape Hives    History of Present Illness    Savannah [name] has a PMH of coronary artery disease status post stent placement in 2023, hyperlipidemia, diastolic CHF, and HTN.  Patient reported that she had mild regurgitation of her mitral valve.  This was evident in her echocardiogram in 2022.  She was seen in follow-up by Dr. Loni on 11/27/2023.  During that time she noted occasional chest pain which she described as pressure.  It was more pronounced with laying on her back.   it was felt that her pain may be esophageal or MSK in nature.  She reported that she had been experiencing urinary tract infections which were difficult to manage.  She had completed a course of Augmentin .  She did not feel that the infection was fully resolved.  She did note occasional bruising which she attributed to Plavix .  She had been off of her furosemide  for her lymphedema.  Her diabetes was managed by endocrinology.  She was seen by her PCP 05/07/2024.  She reported daily headaches for few months.  She noted that she had head trauma a few months ago.  It was felt that she may have postconcussive syndrome.  Brain MRI as well as CBC, CMP, sedimentation rate and CRP were ordered.  It was felt that she may need a referral to neurology.  Her CBC showed normal WBC RBCs of 3.56 and hemoglobin of 11.1 with hematocrit 34.1.  Her iron  was noted to be slightly low at 41.  Ferritin was also decreased at 7.7.  TIBC was 492.8.  She contacted the cardiology clinic today.  She reported shortness of breath.  She was added to my schedule.  She states over the past few weeks she has noticed more shortness of breath.  She notices this with mild increased physical activity such as walking and train.  She had recent head trauma and we reviewed her visit with her PCP yesterday.  She was noted to have anemia at that time.  She also  reports that she has gained around 50 pounds.  She notes that she is not very physically active.  She is concerned at this time with her increased shortness of breath due to her cardiac history.  We reviewed her previous visit and previous stenting.  She expressed understanding.  We will reschedule her echocardiogram to 7 /3 and have her follow-up after echocardiogram.  She does have lower extremity insufficiency and has been following with vein specialist who recommended lower extremity support stockings.  I will give her the elastic support stockings she because she has been having issues with her recently ordered support stockings.  She has not yet scheduled her head MRI.  I will have her follow-up after echocardiogram.  She has plans to go [place]  [place] this [place].  Today she denies chest pain, melena, hematuria, hemoptysis, diaphoresis, weakness, presyncope, syncope, orthopnea, and PND.   Home Medications    Prior to Admission medications   Medication Sig Start Date End Date Taking? Authorizing Provider  acetaminophen  (TYLENOL ) 500 MG tablet Take 2 tablets (1,000 mg total) by mouth every 6 (six) hours as needed for mild pain or fever. 05/13/21   Rai, Nydia POUR, MD  atorvastatin  (LIPITOR) 40 MG tablet Take 1 tablet (40 mg total) by mouth daily. 08/30/23   Acharya, Gayatri A, MD  blood glucose meter kit and supplies KIT Use up to three  times daily as directed. 08/27/23   Kennyth Worth HERO, MD  Blood Glucose Monitoring Suppl DEVI 1 each by Does not apply route in the morning, at noon, and at bedtime. May substitute to any manufacturer covered by patient's insurance. 10/29/23   Motwani, Komal, MD  cetirizine  (ZYRTEC ) 10 MG tablet TAKE 1 TABLET BY MOUTH EVERY DAY 10/22/23   Soldatova, Liuba, MD  Cholecalciferol (VITAMIN D ) 50 MCG (2000 UT) tablet Take 4,000 Units by mouth in the morning.    [provider]  clopidogrel  (PLAVIX ) 75 MG tablet Take 1 tablet (75 mg total) by mouth daily.  02/17/24   Acharya, Gayatri A, MD  clotrimazole  (MYCELEX ) 10 MG troche Take 1 tablet (10 mg total) by mouth 5 (five) times daily. 09/26/23   Nche, Roselie Rockford, NP  Colchicine  (MITIGARE ) 0.6 MG CAPS Take by mouth as needed.    [provider]  Continuous Glucose Sensor (DEXCOM G7 SENSOR) MISC USE AS DIRECTED 10/18/23   Dartha Ernst, MD  cyclobenzaprine  (FLEXERIL ) 10 MG tablet Take 10 mg by mouth at bedtime as needed for muscle spasms. 07/06/20   [provider]  diclofenac  sodium (VOLTAREN ) 1 % GEL Apply 4 g topically 4 (four) times daily. 09/06/17   Kassie Mallick, MD  docusate sodium (COLACE) 100 MG capsule Take 100 mg by mouth 2 (two) times daily.    [provider]  Dulaglutide  (TRULICITY ) 4.5 MG/0.5ML SOAJ INJECT 4.5 MG AS DIRECTED ONCE A WEEK. 12/09/23   Kennyth Worth HERO, MD  fluconazole  (DIFLUCAN ) 200 MG tablet Take 200 mg by mouth as needed.    [provider]  fluorometholone (FML) 0.1 % ophthalmic suspension Place 1 drop into both eyes as needed. 09/16/23   [provider]  fluticasone  (FLONASE ) 50 MCG/ACT nasal spray SPRAY 2 SPRAYS INTO EACH NOSTRIL EVERY DAY 10/22/23   Soldatova, Liuba, MD  gabapentin  (NEURONTIN ) 300 MG capsule Take 300 mg by mouth 3 (three) times daily.    [provider]  gabapentin  (NEURONTIN ) 600 MG tablet Take 600 mg by mouth 2 (two) times daily. 03/22/24   [provider]  Glucagon  (BAQSIMI  ONE PACK) 3 MG/DOSE POWD Place 1 Device into the nose as needed (Low blood sugar with impaired consciousness). 10/17/23   Motwani, Komal, MD  Halobetasol & Lactic Acid (ULTRAVATE X, OINTMENT, EX) Apply 1 Application topically 2 (two) times daily.    [provider]  HYDROcodone -acetaminophen  (NORCO/VICODIN) 5-325 MG tablet Take 1 tablet by mouth as needed for moderate pain (pain score 4-6).    [provider]  insulin  glargine (LANTUS  SOLOSTAR) 100 UNIT/ML Solostar Pen Inject 18 Units into the skin daily.  Take 18 units qam Patient taking differently: Inject 18 Units into the skin daily. Take 16 units qam 03/03/24   Motwani, Komal, MD  insulin  lispro (HUMALOG  KWIKPEN) 100 UNIT/ML KwikPen Inject 15 units  with break fast and lunch, 16-18 with supper 15 min before meals. 04/21/24   Motwani, Komal, MD  Insulin  Pen Needle (BD PEN NEEDLE MINI ULTRAFINE) 31G X 5 MM MISC Use as directed with insulin . 04/23/24   Motwani, Komal, MD  ipratropium (ATROVENT) 0.06 % nasal spray Place 1 spray into both nostrils as needed. 09/09/23   [provider]  levalbuterol  (XOPENEX  HFA) 45 MCG/ACT inhaler Inhale 1 puff into the lungs every 4 (four) hours as needed for wheezing. 05/10/22   Walker, Caitlin S, NP  levocetirizine (XYZAL ) 5 MG tablet Take 1 tablet (5 mg total) by mouth every evening. 02/13/24   Kennyth Worth HERO, MD  levothyroxine  (SYNTHROID ) 125 MCG tablet Take 1 tablet (125 mcg total) by mouth daily. 04/15/24   Motwani, Komal, MD  lidocaine  (LIDODERM ) 5 % PLACE 1 PATCH ONTO THE SKIN DAILY. REMOVE & DISCARD PATCH WITHIN 12 HOURS OR AS DIRECTED BY MD 01/15/23   Cheryl Waddell HERO, PA-C  MAGNESIUM PO Take 3 tablets by mouth daily as needed (foot cramps).    [provider]  methocarbamol (ROBAXIN) 750 MG tablet Take 750 mg by mouth every 6 (six) hours as needed. 08/12/23   [provider]  Multiple Vitamin (MULTIVITAMIN) tablet Take 1 tablet by mouth in the morning and at bedtime.    [provider]  nitroGLYCERIN  (NITROSTAT ) 0.4 MG SL tablet Place 1 tablet (0.4 mg total) under the tongue every 5 (five) minutes as needed for chest pain. 11/27/23   Acharya, Gayatri A, MD  pantoprazole  (PROTONIX ) 40 MG tablet Take 1 tablet (40 mg total) by mouth 2 (two) times daily. 02/24/24   Kennyth Worth HERO, MD  phenazopyridine  (PYRIDIUM ) 100 MG tablet Take 1 tablet (100 mg total) by mouth 3 (three) times daily with meals. 09/26/23   Nche, Roselie Rockford, NP  predniSONE  (DELTASONE ) 5 MG tablet Take 4 tabs po x 2 days,  3  tabs po x 2 days, 2  tabs po x 2 days, 1  tab po x 2 days 04/16/24   Cheryl Waddell HERO, PA-C  traMADol  (ULTRAM ) 50 MG tablet Take 50 mg by mouth every 6 (six) hours as needed (pain).    [provider]    Family History    Family History  Problem Relation Age of Onset   Diabetes Mother    Neuropathy Mother    Diabetes Father    Congestive Heart Failure Father    Hypertension Father    Myelodysplastic syndrome Father    Bladder Cancer Sister    Multiple sclerosis Sister    Breast cancer Sister        breast onset age 63   Osteoporosis Sister    Liver cancer Maternal Aunt    Breast cancer Maternal Aunt    Diabetes Maternal Aunt    Colon cancer Maternal Uncle    Diabetes Maternal Uncle    Neuropathy Paternal Aunt    Diabetes Maternal Grandmother    Diabetes Paternal Grandmother    She indicated that her mother is deceased. She indicated that her father is deceased. She indicated that all of her three sisters are alive. She indicated that her brother is alive. She indicated that the status of her maternal grandmother is unknown. She indicated that the status of her paternal grandmother is unknown. She indicated that the status of her maternal aunt is unknown. She indicated that the status of her maternal uncle is unknown. She indicated that the status of her paternal aunt  is unknown.  Social History    Social History   Socioeconomic History   Marital status: Single    Spouse name: Not on file   Number of children: 0   Years of education: 16   Highest education level: Bachelor's degree (e.g., BA, AB, BS)  Occupational History   Occupation: Research scientist (medical)   Occupation: Retired  Tobacco Use   Smoking status: Former    Current packs/day: 0.00    Average packs/day: 1.5 packs/day for 15.0 years (22.5 ttl pk-yrs)    Types: Cigarettes    Start date: 67    Quit date: 1992    Years since quitting: 33.5    Passive exposure: Never   Smokeless tobacco: Never  Vaping Use    Vaping status: Never Used  Substance and Sexual Activity   Alcohol use: Yes    Comment: rarely   Drug use: Never   Sexual activity: Not Currently  Other Topics Concern   Not on file  Social History Narrative   Not on file   Social Drivers of Health   Financial Resource Strain: Low Risk  (05/06/2024)   Overall Financial Resource Strain (CARDIA)    Difficulty of Paying Living Expenses: Not very hard  Food Insecurity: No Food Insecurity (05/06/2024)   Hunger Vital Sign    Worried About Running Out of Food in the Last Year: Never true    Ran Out of Food in the Last Year: Never true  Transportation Needs: No Transportation Needs (05/06/2024)   PRAPARE - Administrator, Civil Service (Medical): No    Lack of Transportation (Non-Medical): No  Physical Activity: Inactive (05/06/2024)   Exercise Vital Sign    Days of Exercise per Week: 0 days    Minutes of Exercise per Session: Not on file  Stress: No Stress Concern Present (05/06/2024)   [employer] of Occupational Health - Occupational Stress Questionnaire    Feeling of Stress: Only a little  Social Connections: Moderately Integrated (05/06/2024)   Social Connection and Isolation Panel    Frequency of Communication with Friends and Family: More than three times a week    Frequency of Social Gatherings with Friends and Family: Three times a week    Attends Religious Services: More than 4 times per year    Active Member of Clubs or Organizations: Yes    Attends Banker Meetings: More than 4 times per year    Marital Status: Never married  Intimate Partner Violence: Not At Risk (05/19/2020)   Humiliation, Afraid, Rape, and Kick questionnaire    Fear of Current or Ex-Partner: No    Emotionally Abused: No    Physically Abused: No    Sexually Abused: No     Review of Systems    General:  No chills, fever, night sweats or weight changes.  Cardiovascular:  No chest pain, dyspnea on exertion, edema, orthopnea,  palpitations, paroxysmal nocturnal dyspnea. Dermatological: No rash, lesions/masses Respiratory: No cough, dyspnea Urologic: No hematuria, dysuria Abdominal:   No nausea, vomiting, diarrhea, bright red blood per rectum, melena, or hematemesis Neurologic:  No visual changes, wkns, changes in mental status. All other systems reviewed and are otherwise negative except as noted above.  Physical Exam    VS:  BP 121/78   Pulse 78   Ht 5' 11 (1.803 m)   Wt 212 lb 3.2 oz (96.3 kg)   SpO2 96%   BMI 29.60 kg/m  , BMI Body mass index is 29.6 kg/m. GEN:  Well nourished, well developed, in no acute distress. HEENT: normal. Neck: Supple, no JVD, carotid bruits, or masses. Cardiac: RRR, no murmurs, rubs, or gallops. No clubbing, cyanosis, 1-2+ bilateral lower extremity edema.  Radials/DP/PT 2+ and equal bilaterally.  Respiratory:  Respirations regular and unlabored, clear to auscultation bilaterally. GI: Soft, nontender, nondistended, BS + x 4. MS: no deformity or atrophy. Skin: warm and dry, no rash. Neuro:  Strength and sensation are intact. Psych: Normal affect.  Accessory Clinical Findings    Recent Labs: 04/09/2024: TSH 8.25 05/07/2024: ALT 75; BUN 17; Creatinine, Ser 1.29; Hemoglobin 11.1; Platelets 179.0; Potassium 4.1; Sodium 142   Recent Lipid Panel    Component Value Date/Time   CHOL 120 02/20/2024 0833   CHOL 136 04/29/2019 1336   TRIG 94 02/20/2024 0833   HDL 44 (L) 02/20/2024 0833   HDL 47 04/29/2019 1336   CHOLHDL 2.7 02/20/2024 0833   VLDL 18.8 03/01/2022 1112   LDLCALC 58 02/20/2024 0833   LDLDIRECT 99.0 10/12/2011 1219         ECG personally reviewed by me today-none today.     Echocardiogram 04/04/2021  1. Left ventricular ejection fraction, by estimation, is 60 to 65%. The  left ventricle has normal function. The left ventricle has no regional  wall motion abnormalities. Left ventricular diastolic parameters were  normal. The average left ventricular   global longitudinal strain is -19.9 %. The global longitudinal strain is  normal.   2. Right ventricular systolic function is normal. The right ventricular  size is normal. There is normal pulmonary artery systolic pressure. The  estimated right ventricular systolic pressure is 26.0 mmHg.   3. The mitral valve is grossly normal. Mild mitral valve regurgitation.  No evidence of mitral stenosis.   4. The aortic valve is tricuspid. Aortic valve regurgitation is not  visualized. No aortic stenosis is present.   5. The inferior vena cava is normal in size with greater than 50%  respiratory variability, suggesting right atrial pressure of 3 mmHg.   FINDINGS   Left Ventricle: Left ventricular ejection fraction, by estimation, is 60  to 65%. The left ventricle has normal function. The left ventricle has no  regional wall motion abnormalities. The average left ventricular global  longitudinal strain is -19.9 %.  The global longitudinal strain is normal. The left ventricular internal  cavity size was normal in size. There is no left ventricular hypertrophy.  Left ventricular diastolic parameters were normal.   Right Ventricle: The right ventricular size is normal. No increase in  right ventricular wall thickness. Right ventricular systolic function is  normal. There is normal pulmonary artery systolic pressure. The tricuspid  regurgitant velocity is 2.40 m/s, and   with an assumed right atrial pressure of 3 mmHg, the estimated right  ventricular systolic pressure is 26.0 mmHg.   Left Atrium: Left atrial size was normal in size.   Right Atrium: Right atrial size was normal in size.   Pericardium: Trivial pericardial effusion is present. Presence of  pericardial fat pad.   Mitral Valve: The mitral valve is grossly normal. Mild mitral valve  regurgitation. No evidence of mitral valve stenosis.   Tricuspid Valve: The tricuspid valve is grossly normal. Tricuspid valve  regurgitation is  trivial. No evidence of tricuspid stenosis.   Aortic Valve: The aortic valve is tricuspid. Aortic valve regurgitation is  not visualized. No aortic stenosis is present.   Pulmonic Valve: The pulmonic valve was grossly normal. Pulmonic valve  regurgitation is not  visualized. No evidence of pulmonic stenosis.   Aorta: The aortic root and ascending aorta are structurally normal, with  no evidence of dilitation.   Venous: The right upper pulmonary vein is normal. The inferior vena cava  is normal in size with greater than 50% respiratory variability,  suggesting right atrial pressure of 3 mmHg.   IAS/Shunts: The atrial septum is grossly normal.      Assessment & Plan   1.  Shortness of breath-has been present for a few weeks.  Notes increased work of breathing with increased physical activity.  Had CBC and iron  drawn yesterday which show hemoglobin of 11.1 and iron  of 41.  She denies bleeding issues. Order echocardiogram Iron  rich diet Weight loss-reports gaining around 50 pounds Increase physical activity as tolerated-has been fairly sedentary  Coronary artery disease-denies chest pain.  Denies exertional type symptoms.  Status post PCI with stenting in 2023 Heart healthy low-sodium diet Continue atorvastatin , Plavix , nitroglycerin  as needed  Hyperlipidemia-LDL 58. High-fiber diet Continue Plavix , atorvastatin   Chronic diastolic CHF-weight today 212.2 pounds.  Bilateral lower extremity 1-2+ pitting edema.  Echocardiogram 04/04/2021 showed LVEF of 60 to 65%, normal diastolic parameters, mild mitral valve regurgitation and trivial tricuspid valve regurgitation.  Working with vein and vascular Heart healthy low-sodium diet Increase physical activity as tolerated Daily weights Elevate lower extremities when not active Lower extremity support stockings-elastic support stocking sheet given  Disposition: Follow-up with Dr. Loni or me in 1-2 months.   Josefa HERO. Zoelle Markus NP-C      05/08/2024, 4:37 PM [facility] [address] Office [phone] Fax [phone]    I spent 14 minutes examining this patient, reviewing medications, and using patient centered shared decision making involving their cardiac care.   I spent  20 minutes reviewing past medical history,  medications, and prior cardiac tests.

## 2024-05-08 NOTE — Telephone Encounter (Signed)
 See note

## 2024-05-08 NOTE — Telephone Encounter (Signed)
 It is ok to refer to hematology for the infusions.   Recommend she come back before leaving to recheck labs if she is able.  Worth HERO. Kennyth, MD 05/08/2024 2:16 PM

## 2024-05-08 NOTE — Progress Notes (Signed)
 Her iron  is low.  Recommend she start iron  supplement ferrous sulfate 365 mg every other day on an empty stomach.  We should recheck this again in 3 to 6 months.  Is important that she follow-up with GI soon for her colonoscopy to make sure that she is not having any issues with internal bleeding.  Please place referral if needed.  Her B12 level is at goal.  She did have mild elevation in her liver numbers.  I would like for her to come back in a couple of weeks to recheck.  Please place future order for CMET, lipase, and ggt.   We will contact her once we get results on her MRI.

## 2024-05-11 ENCOUNTER — Other Ambulatory Visit: Payer: Self-pay | Admitting: *Deleted

## 2024-05-11 ENCOUNTER — Telehealth: Payer: Self-pay | Admitting: Internal Medicine

## 2024-05-11 DIAGNOSIS — D509 Iron deficiency anemia, unspecified: Secondary | ICD-10-CM

## 2024-05-11 MED ORDER — PANTOPRAZOLE SODIUM 40 MG PO TBEC: 40.0000 mg | DELAYED_RELEASE_TABLET | Freq: Two times a day (BID) | ORAL | 0 refills | Status: DC

## 2024-05-11 NOTE — Telephone Encounter (Signed)
 Called to schedule f/u after echo. Patient states Savannah [name] told her that the appt she has on 09/22 with Dr. Loni would be her follow up. She also will be out of the country for a month about a week after her echo and unfortunately there were no appointments available prior to her vacation. She will keep 09/22 as the f/u to her echo. FYI

## 2024-05-11 NOTE — Telephone Encounter (Signed)
-----   Message from Sabina S sent at 05/08/2024  4:12 PM EDT ----- Regarding: Follow up Good Afternoon ,  Patient Needs a follow up with Dr. Loni or Josefa Beauvais after her Echo within 2 weeks . There is Nothing available , Please call patient to make her appointment .   Thank you

## 2024-05-13 ENCOUNTER — Telehealth: Payer: Self-pay

## 2024-05-13 NOTE — Telephone Encounter (Signed)
 Pt is inquiring about scheduling her MRI. She will be leaving town soon.

## 2024-05-14 ENCOUNTER — Ambulatory Visit (HOSPITAL_COMMUNITY)
Admission: RE | Admit: 2024-05-14 | Discharge: 2024-05-14 | Disposition: A | Discharge status: Home / Self Care | Source: Ambulatory Visit | Attending: Internal Medicine | Admitting: Internal Medicine

## 2024-05-14 DIAGNOSIS — I2510 Atherosclerotic heart disease of native coronary artery without angina pectoris: Secondary | ICD-10-CM

## 2024-05-14 DIAGNOSIS — R0609 Other forms of dyspnea: Secondary | ICD-10-CM | POA: Diagnosis not present

## 2024-05-14 DIAGNOSIS — R600 Localized edema: Secondary | ICD-10-CM | POA: Diagnosis not present

## 2024-05-14 LAB — ECHOCARDIOGRAM COMPLETE
AR max vel: 2.37 cm2
AV Area VTI: 2.32 cm2
AV Area mean vel: 2.27 cm2
AV Mean grad: 4 mmHg
AV Peak grad: 8.5 mmHg
Ao pk vel: 1.46 m/s
Area-P 1/2: 4.57 cm2
S' Lateral: 3.14 cm

## 2024-05-20 ENCOUNTER — Encounter: Payer: Self-pay | Admitting: "Endocrinology

## 2024-05-20 ENCOUNTER — Ambulatory Visit (HOSPITAL_COMMUNITY)
Admission: RE | Admit: 2024-05-20 | Discharge: 2024-05-20 | Disposition: A | Discharge status: Home / Self Care | Source: Ambulatory Visit | Attending: Family Medicine | Admitting: Family Medicine

## 2024-05-20 DIAGNOSIS — R519 Headache, unspecified: Secondary | ICD-10-CM | POA: Diagnosis not present

## 2024-05-20 DIAGNOSIS — I6782 Cerebral ischemia: Secondary | ICD-10-CM | POA: Diagnosis not present

## 2024-05-21 NOTE — Progress Notes (Signed)
 Her MRI shows age-related changes and a small amount of fluid behind her right ear.  Neither of these are significant issues.  Overall her MRI is reassuring without any obvious explanation for her worsening headaches.  Recommend that we refer her to neurology if she is still having persistent headaches as it may be due to postconcussive syndrome as we discussed at her office visit.  Please place referral if she is agreeable.

## 2024-05-22 ENCOUNTER — Ambulatory Visit: Payer: Self-pay | Admitting: Internal Medicine

## 2024-05-22 DIAGNOSIS — Q820 Hereditary lymphedema: Secondary | ICD-10-CM | POA: Diagnosis not present

## 2024-05-22 DIAGNOSIS — I890 Lymphedema, not elsewhere classified: Secondary | ICD-10-CM | POA: Diagnosis not present

## 2024-05-22 DIAGNOSIS — R600 Localized edema: Secondary | ICD-10-CM | POA: Diagnosis not present

## 2024-05-22 NOTE — Telephone Encounter (Signed)
 Echo results reviewed with pt, per Dr. Loni. Pt needs OV within 30 days of 8/25 and needs to be scheduled for TEE on 8/25 w/Acharya. See under Results Note.

## 2024-05-24 ENCOUNTER — Other Ambulatory Visit: Payer: Self-pay | Admitting: "Endocrinology

## 2024-05-24 DIAGNOSIS — E1165 Type 2 diabetes mellitus with hyperglycemia: Secondary | ICD-10-CM

## 2024-05-25 NOTE — Telephone Encounter (Signed)
 Requested Prescriptions   Pending Prescriptions Disp Refills   insulin  lispro (HUMALOG ) 100 UNIT/ML KwikPen [Pharmacy Med Name: Insulin  Lispro (1 Unit Dial ) 100 UNIT/ML Subcutaneous Solution Pen-injector] 15 mL 0    Sig: INJECT 5 UNITS SUBCUTANEOUSLY THREE TIMES DAILY

## 2024-05-26 ENCOUNTER — Other Ambulatory Visit (HOSPITAL_COMMUNITY)

## 2024-05-26 NOTE — Telephone Encounter (Signed)
 Called and spoke to pt to inform her that I have scheduled her for the previously discussed TEE on 07/06/2024 at 8:30 am (arrival time of 7:30 am) with Dr. Loni. Gave her Vannie, NP's recommendation on trial ing Lasix  for 3-5 days to help with fluid overload/sob. Pt immediately said, NO, I'm not taking that again. She then explained that her cramping was severe and her fingers would lock and she did not wish to restart it. She also stated that she could barely swallow those big potassium pills. She states, I will talk to the Vein Mal in August and see what he thinks about taking that medication again.  Also, pt prefers to see Emelia, NP (pre TEE visit) as she recently saw him and is more familiar with him and does not remember Vannie, NP as well. Scheduled her on 07/01/24.

## 2024-05-26 NOTE — Telephone Encounter (Signed)
 She could see APP prior to 07/06/24 for office visit to discuss TEE. Would recommend she try Lasix  40mg  daily for the next 3-5 days to assess response. Given significant mitral regurgitation on imaging, it will help to reduce fluid volume and improve shortness of breath. Typically cramping comes from low potassium or magnesium. Potassium looked good on recent labs. For monitoring, would recommend labs including BMET, magnesium Friday 05/29/24 to ensure stability of electrolytes and prevent cramping.   Savannah Wiebelhaus S Kinza Gouveia, NP

## 2024-06-04 ENCOUNTER — Ambulatory Visit: Payer: Medicare Other | Admitting: Rheumatology

## 2024-06-15 DIAGNOSIS — N390 Urinary tract infection, site not specified: Secondary | ICD-10-CM | POA: Diagnosis not present

## 2024-06-15 DIAGNOSIS — R300 Dysuria: Secondary | ICD-10-CM | POA: Diagnosis not present

## 2024-06-18 ENCOUNTER — Telehealth: Payer: Self-pay | Admitting: "Endocrinology

## 2024-06-18 DIAGNOSIS — E1165 Type 2 diabetes mellitus with hyperglycemia: Secondary | ICD-10-CM

## 2024-06-18 MED ORDER — DEXCOM G7 SENSOR MISC: 1.0000 | 0 refills | Status: DC

## 2024-06-18 NOTE — Telephone Encounter (Signed)
 MEDICATION: Continuous Glucose Sensor (DEXCOM G7 SENSOR) MISC   PHARMACY:  [facility] Address: [address], [place], [place] [address] Phone: [phone]  HAS THE PATIENT CONTACTED THEIR PHARMACY?  Yes  LAST REFILL:   IS THIS A 90 DAY SUPPLY : No  IS PATIENT OUT OF MEDICATION: Yes  IF NOT; HOW MUCH IS LEFT:   LAST APPOINTMENT DATE: @6 /10/2024  NEXT APPOINTMENT DATE:@8 /09/2024  DO WE HAVE YOUR PERMISSION TO LEAVE A DETAILED MESSAGE?: Yes  OTHER COMMENTS: Patient is out of town & states that she bumped her sensor & it quit working & she has no more with her, so this is a one time refill.   **Let patient know to contact pharmacy at the end of the day to make sure medication is ready. **  ** Please notify patient to allow 48-72 hours to process**  **Encourage patient to contact the pharmacy for refills or they can request refills through [facility]**

## 2024-06-18 NOTE — Telephone Encounter (Signed)
 Requested Prescriptions   Signed Prescriptions Disp Refills   Continuous Glucose Sensor (DEXCOM G7 SENSOR) MISC 1 each 0    Sig: 1 Device by Does not apply route See admin instructions.    Authorizing Provider: DARTHA ERNST    Ordering User: ARLOA JEOFFREY SAILOR

## 2024-06-22 ENCOUNTER — Ambulatory Visit (INDEPENDENT_AMBULATORY_CARE_PROVIDER_SITE_OTHER): Admitting: "Endocrinology

## 2024-06-22 ENCOUNTER — Encounter: Payer: Self-pay | Admitting: "Endocrinology

## 2024-06-22 ENCOUNTER — Other Ambulatory Visit (HOSPITAL_COMMUNITY)

## 2024-06-22 ENCOUNTER — Encounter: Payer: Self-pay | Admitting: Internal Medicine

## 2024-06-22 VITALS — BP 130/80 | HR 85 | Ht 71.0 in | Wt 212.0 lb

## 2024-06-22 DIAGNOSIS — Z7985 Long-term (current) use of injectable non-insulin antidiabetic drugs: Secondary | ICD-10-CM

## 2024-06-22 DIAGNOSIS — Z794 Long term (current) use of insulin: Secondary | ICD-10-CM | POA: Diagnosis not present

## 2024-06-22 DIAGNOSIS — E1165 Type 2 diabetes mellitus with hyperglycemia: Secondary | ICD-10-CM | POA: Diagnosis not present

## 2024-06-22 DIAGNOSIS — E890 Postprocedural hypothyroidism: Secondary | ICD-10-CM | POA: Diagnosis not present

## 2024-06-22 DIAGNOSIS — E7800 Pure hypercholesterolemia, unspecified: Secondary | ICD-10-CM

## 2024-06-22 LAB — TSH+FREE T4: TSH W/REFLEX TO FT4: 3.71 m[IU]/L (ref 0.40–4.50)

## 2024-06-22 MED ORDER — TRULICITY 4.5 MG/0.5ML ~~LOC~~ SOAJ: 4.5000 mg | SUBCUTANEOUS | 1 refills | Status: DC

## 2024-06-22 MED ORDER — LANTUS SOLOSTAR 100 UNIT/ML ~~LOC~~ SOPN
25.0000 [IU] | PEN_INJECTOR | Freq: Every day | SUBCUTANEOUS | 1 refills | Status: DC
Start: 2024-06-22 — End: 2024-06-26

## 2024-06-22 NOTE — Progress Notes (Signed)
 Outpatient Endocrinology Note Savannah Birmingham, MD  06/22/24   Savannah [name] May 04, 1956 [id number]  Referring Provider: Kennyth Worth HERO, MD Primary Care Provider: Kennyth Worth HERO, MD Reason for consultation: Subjective   Assessment & Plan  Diagnoses and all orders for this visit:  Postoperative hypothyroidism -     TSH + free T4  Uncontrolled type 2 diabetes mellitus with hyperglycemia (HCC) -     insulin  glargine (LANTUS  SOLOSTAR) 100 UNIT/ML Solostar Pen; Inject 25 Units into the skin daily. Take 18 units qam  Long-term (current) use of injectable non-insulin  antidiabetic drugs  Long-term insulin  use (HCC)  Pure hypercholesterolemia  Other orders -     Dulaglutide  (TRULICITY ) 4.5 MG/0.5ML SOAJ; Inject 4.5 mg as directed once a week.    History of thyroidectomy on levothyroxine  125 mcg po every day, takes with other meds Recommend to take levothyroxine  first thing in the morning on empty stomach and wait at least 30 minutes to 1 hour before eating or drinking anything or taking any other medications. Space out levothyroxine  by 4 hours from any acid reflux medication, fibrate, iron , calcium , multivitamin, and nutritional supplements.  02/27/24: start LT4 112 mcg po qd, TSH at 8 04/15/24: on LT4 125 mcg po every day Repeat lab today   Diabetes Type II complicated by heart stents, neuropathy, nephropathy  She took insulin  from 2004-2011, when she had gastric bypass surgery Lab Results  Component Value Date   GFR 42.20 (L) 05/07/2024   Hba1c goal less than 7, current Hba1c is  Lab Results  Component Value Date   HGBA1C 8.9 (A) 10/17/2023   Will recommend the following: Trulicity  4.5 mg weekly Lantus  25 units qam  Humalog  15 units with break fast and 15 units with lunch, 20-22 with supper 15 min before meals  Correction scale: Use in addition to your meal time/short acting insulin  based on blood sugars as follows: 201 - 225: 3 units 226 - 250: 4 units 251 - 275: 5  units 276 - 300: 6 units 301 - 325: 7 units 326 - 350: 8 units 351 - 375: 9 units 376 - 400: 10 units   Stopped Repaglinide  2 mg tid - not helping  Stopped Invokana  300 mg every day-gets UTIs and has pelvic lichen sclerosis   Has DexCom Ordered DM education previously   No known contraindications/side effects to any of above medications No history of MEN syndrome/medullary thyroid  cancer/pancreatitis or pancreatic cancer in self or family Glucagon  discussed and prescribed with refills on 06/22/24  -Last LD and Tg are as follows: Lab Results  Component Value Date   LDLCALC 58 02/20/2024    Lab Results  Component Value Date   TRIG 94 02/20/2024   -On atorvastatin  40 mg QD -Follow low fat diet and exercise   -Blood pressure goal <140/90 - Microalbumin/creatinine goal is < 30 -Last MA/Cr is as follows: Lab Results  Component Value Date   MICROALBUR 0.7 05/07/2024   -not on ACE/ARB, sees a nephrologist  -diet changes including salt restriction -limit eating outside -counseled BP targets per standards of diabetes care -uncontrolled blood pressure can lead to retinopathy, nephropathy and cardiovascular and atherosclerotic heart disease  Reviewed and counseled on: -A1C target -Blood sugar targets -Complications of uncontrolled diabetes  -Checking blood sugar before meals and bedtime and bring log next visit -All medications with mechanism of action and side effects -Hypoglycemia management: rule of 15's, Glucagon  Emergency Kit and medical alert ID -low-carb low-fat plate-method diet -At least  20 minutes of physical activity per day -Annual dilated retinal eye exam and foot exam -compliance and follow up needs -follow up as scheduled or earlier if problem gets worse  Call if blood sugar is less than 70 or consistently above 250    Take a 15 gm snack of carbohydrate at bedtime before you go to sleep if your blood sugar is less than 100.    If you are going to fast  after midnight for a test or procedure, ask your physician for instructions on how to reduce/decrease your insulin  dose.    Call if blood sugar is less than 70 or consistently above 250  -Treating a low sugar by rule of 15  (15 gms of sugar every 15 min until sugar is more than 70) If you feel your sugar is low, test your sugar to be sure If your sugar is low (less than 70), then take 15 grams of a fast acting Carbohydrate (3-4 glucose tablets or glucose gel or 4 ounces of juice or regular soda) Recheck your sugar 15 min after treating low to make sure it is more than 70 If sugar is still less than 70, treat again with 15 grams of carbohydrate          Don't drive the hour of hypoglycemia  If unconscious/unable to eat or drink by mouth, use glucagon  injection or nasal spray baqsimi  and call 911. Can repeat again in 15 min if still unconscious.  Return in about 3 months (around 09/22/2024) for visit, labs today.   I have reviewed current medications, nurse's notes, allergies, vital signs, past medical and surgical history, family medical history, and social history for this encounter. Counseled patient on symptoms, examination findings, lab findings, imaging results, treatment decisions and monitoring and prognosis. The patient understood the recommendations and agrees with the treatment plan. All questions regarding treatment plan were fully answered.  Savannah Birmingham, MD  06/22/24    History of Present Illness Savannah [name] is a 68 y.o. year old female who presents for evaluation of Type II diabetes mellitus and hypothyroidism.  Savannah [name] was first diagnosed of DM in 2000.   Diabetes education +  Home diabetes regimen: Trulicity  4.5 mg weekly Lantus  25 units qam  Humalog  15 units with break fast and 14 units with lunch, 18-22 with supper 15 min before meals  Correction scale: Use in addition to your meal time/short acting insulin  based on blood sugars as follows: 201 - 225: 3  units 226 - 250: 4 units 251 - 275: 5 units 276 - 300: 6 units 301 - 325: 7 units 326 - 350: 8 units 351 - 375: 9 units 376 - 400: 10 units   COMPLICATIONS - MI/Stroke, + angina s/p heart stents  -  retinopathy +  neuropathy +  nephropathy  BLOOD SUGAR DATA CGM interpretation: At today's visit, we reviewed her CGM downloads. The full report is scanned in the media. Reviewing the CGM trends, BG are elevated between 3pm-9pm, and usually usual normal, with variability into high.  Physical Exam  BP 130/80   Pulse 85   Ht 5' 11 (1.803 m)   Wt 212 lb (96.2 kg)   SpO2 96%   BMI 29.57 kg/m    Constitutional: well developed, well nourished Head: normocephalic, atraumatic Eyes: sclera anicteric, no redness Neck: supple Lungs: normal respiratory effort Neurology: alert and oriented Skin: dry, no appreciable rashes Musculoskeletal: no appreciable defects Psychiatric: normal mood and affect Diabetic Foot Exam -  Simple   No data filed      Current Medications Patient's Medications  New Prescriptions   No medications on file  Previous Medications   ACETAMINOPHEN  (TYLENOL ) 500 MG TABLET    Take 2 tablets (1,000 mg total) by mouth every 6 (six) hours as needed for mild pain or fever.   ATORVASTATIN  (LIPITOR) 40 MG TABLET    Take 1 tablet (40 mg total) by mouth daily.   BLOOD GLUCOSE METER KIT AND SUPPLIES KIT    Use up to three  times daily as directed.   BLOOD GLUCOSE MONITORING SUPPL DEVI    1 each by Does not apply route in the morning, at noon, and at bedtime. May substitute to any manufacturer covered by patient's insurance.   CETIRIZINE  (ZYRTEC ) 10 MG TABLET    TAKE 1 TABLET BY MOUTH EVERY DAY   CHOLECALCIFEROL (VITAMIN D ) 50 MCG (2000 UT) TABLET    Take 4,000 Units by mouth in the morning.   CLOPIDOGREL  (PLAVIX ) 75 MG TABLET    Take 1 tablet (75 mg total) by mouth daily.   CLOTRIMAZOLE  (MYCELEX ) 10 MG TROCHE    Take 1 tablet (10 mg total) by mouth 5 (five) times daily.    COLCHICINE  (MITIGARE ) 0.6 MG CAPS    Take by mouth as needed.   CONTINUOUS GLUCOSE SENSOR (DEXCOM G7 SENSOR) MISC    1 Device by Does not apply route See admin instructions.   CYCLOBENZAPRINE  (FLEXERIL ) 10 MG TABLET    Take 10 mg by mouth at bedtime as needed for muscle spasms.   DICLOFENAC  SODIUM (VOLTAREN ) 1 % GEL    Apply 4 g topically 4 (four) times daily.   DOCUSATE SODIUM (COLACE) 100 MG CAPSULE    Take 100 mg by mouth 2 (two) times daily.   FLUCONAZOLE  (DIFLUCAN ) 200 MG TABLET    Take 200 mg by mouth as needed.   FLUOROMETHOLONE (FML) 0.1 % OPHTHALMIC SUSPENSION    Place 1 drop into both eyes as needed.   FLUTICASONE  (FLONASE ) 50 MCG/ACT NASAL SPRAY    SPRAY 2 SPRAYS INTO EACH NOSTRIL EVERY DAY   GABAPENTIN  (NEURONTIN ) 300 MG CAPSULE    Take 300 mg by mouth 3 (three) times daily.   GABAPENTIN  (NEURONTIN ) 600 MG TABLET    Take 600 mg by mouth 2 (two) times daily.   GLUCAGON  (BAQSIMI  ONE PACK) 3 MG/DOSE POWD    Place 1 Device into the nose as needed (Low blood sugar with impaired consciousness).   HALOBETASOL & LACTIC ACID (ULTRAVATE X, OINTMENT, EX)    Apply 1 Application topically 2 (two) times daily.   HYDROCODONE -ACETAMINOPHEN  (NORCO/VICODIN) 5-325 MG TABLET    Take 1 tablet by mouth as needed for moderate pain (pain score 4-6).   INSULIN  LISPRO (HUMALOG ) 100 UNIT/ML KWIKPEN    INJECT 5 UNITS SUBCUTANEOUSLY THREE TIMES DAILY   INSULIN  PEN NEEDLE (BD PEN NEEDLE MINI ULTRAFINE) 31G X 5 MM MISC    Use as directed with insulin .   IPRATROPIUM (ATROVENT) 0.06 % NASAL SPRAY    Place 1 spray into both nostrils as needed.   LEVALBUTEROL  (XOPENEX  HFA) 45 MCG/ACT INHALER    Inhale 1 puff into the lungs every 4 (four) hours as needed for wheezing.   LEVOCETIRIZINE (XYZAL ) 5 MG TABLET    Take 1 tablet (5 mg total) by mouth every evening.   LEVOTHYROXINE  (SYNTHROID ) 125 MCG TABLET    Take 1 tablet (125 mcg total) by mouth daily.   LIDOCAINE  (LIDODERM ) 5 %  PLACE 1 PATCH ONTO THE SKIN DAILY. REMOVE &  DISCARD PATCH WITHIN 12 HOURS OR AS DIRECTED BY MD   MAGNESIUM PO    Take 3 tablets by mouth daily as needed (foot cramps).   METHOCARBAMOL (ROBAXIN) 750 MG TABLET    Take 750 mg by mouth every 6 (six) hours as needed.   MULTIPLE VITAMIN (MULTIVITAMIN) TABLET    Take 1 tablet by mouth in the morning and at bedtime.   NITROFURANTOIN , MACROCRYSTAL-MONOHYDRATE, (MACROBID ) 100 MG CAPSULE    Take 100 mg by mouth every 12 (twelve) hours.   NITROGLYCERIN  (NITROSTAT ) 0.4 MG SL TABLET    Place 1 tablet (0.4 mg total) under the tongue every 5 (five) minutes as needed for chest pain.   PANTOPRAZOLE  (PROTONIX ) 40 MG TABLET    Take 1 tablet (40 mg total) by mouth 2 (two) times daily.   PHENAZOPYRIDINE  (PYRIDIUM ) 100 MG TABLET    Take 1 tablet (100 mg total) by mouth 3 (three) times daily with meals.   PREDNISONE  (DELTASONE ) 5 MG TABLET    Take 4 tabs po x 2 days, 3  tabs po x 2 days, 2  tabs po x 2 days, 1  tab po x 2 days   TRAMADOL  (ULTRAM ) 50 MG TABLET    Take 50 mg by mouth every 6 (six) hours as needed (pain).  Modified Medications   Modified Medication Previous Medication   DULAGLUTIDE  (TRULICITY ) 4.5 MG/0.5ML SOAJ Dulaglutide  (TRULICITY ) 4.5 MG/0.5ML SOAJ      Inject 4.5 mg as directed once a week.    INJECT 4.5 MG AS DIRECTED ONCE A WEEK.   INSULIN  GLARGINE (LANTUS  SOLOSTAR) 100 UNIT/ML SOLOSTAR PEN insulin  glargine (LANTUS  SOLOSTAR) 100 UNIT/ML Solostar Pen      Inject 25 Units into the skin daily. Take 18 units qam    Inject 18 Units into the skin daily. Take 18 units qam  Discontinued Medications   No medications on file    Allergies Allergies  Allergen Reactions   Ace Inhibitors Cough   Caffeine     PVCs   Ciprofloxacin Itching   Morphine  And Codeine Itching    Pt tolerates medication if given with diphenhydramine    Mucinex [Guaifenesin Er]     PVCs   Nsaids     Gastric Bypass Surgery - unable to take, tolerates ec 81 aspirin     Other     Antihistamine-alkylamine - PVCs tolerates  benadryl     Silicone Hives   Tape Hives    Past Medical History Past Medical History:  Diagnosis Date   ABDOMINAL PAIN, CHRONIC 10/20/2009   Acute cystitis 08/17/2009   ALLERGIC RHINITIS 11/21/2007   Allergy     ASYMPTOMATIC POSTMENOPAUSAL STATUS 09/29/2008   Bariatric surgery status 07/07/2010   Bariatric surgery status 07/07/2010   Qualifier: Diagnosis of  By: Kassie MD, Sean A    Cancer [facility])    uterine   Cataract    Coronary artery disease    DEPRESSION 11/21/2007   DIABETES MELLITUS, TYPE II 07/08/2007   DYSPNEA 05/20/2009   Edema 05/20/2009   Eosinophilic esophagitis 02/13/2008   FEVER UNSPECIFIED 10/20/2009   FEVER, HX OF 03/09/2010   GERD (gastroesophageal reflux disease)    Headache(784.0) 02/06/2010   Hepatomegaly 01/06/2008   HYPERLIPIDEMIA 07/08/2007   HYPERTENSION 07/08/2007   Kidney disease    followed by nephrology, per patient   LEUKOPENIA, MILD 09/29/2008   Lymphedema    left leg, dx approximately in 03/2023 per patient   Melanoma in  situ of left upper arm (HCC) 04/03/2023   OBESITY 01/06/2008   OSTEOARTHRITIS 01/06/2008   Other chronic nonalcoholic liver disease 01/06/2008   PERIPHERAL NEUROPATHY 11/21/2007   Peripheral neuropathy    Postsurgical hypothyroidism 09/19/2010   SINUSITIS- ACUTE-NOS 11/21/2007   TB SKIN TEST, POSITIVE 02/06/2010   THYROID  NODULE 03/09/2010    Past Surgical History Past Surgical History:  Procedure Laterality Date   ABDOMINAL HYSTERECTOMY     BASAL CELL CARCINOMA EXCISION     CARDIAC CATHETERIZATION     CHOLECYSTECTOMY N/A 05/11/2021   Procedure: LAPAROSCOPIC CHOLECYSTECTOMY WITH POSSIBLE  INTRAOPERATIVE CHOLANGIOGRAM;  Surgeon: Gladis Cough, MD;  Location: [facility];  Service: General;  Laterality: N/A;   COLONOSCOPY     CORONARY ATHERECTOMY N/A 05/03/2022   Procedure: CORONARY ATHERECTOMY;  Surgeon: Wendel Lurena POUR, MD;  Location: [facility];  Service: Cardiovascular;  Laterality: N/A;   CORONARY  IMAGING/OCT N/A 05/03/2022   Procedure: INTRAVASCULAR IMAGING/OCT;  Surgeon: Wendel Lurena POUR, MD;  Location: [facility];  Service: Cardiovascular;  Laterality: N/A;   CORONARY PRESSURE/FFR STUDY N/A 05/03/2022   Procedure: INTRAVASCULAR PRESSURE WIRE/FFR STUDY;  Surgeon: Wendel Lurena POUR, MD;  Location: [facility];  Service: Cardiovascular;  Laterality: N/A;   CORONARY STENT INTERVENTION N/A 05/03/2022   Procedure: CORONARY STENT INTERVENTION;  Surgeon: Wendel Lurena POUR, MD;  Location: [facility];  Service: Cardiovascular;  Laterality: N/A;  lad   EYE SURGERY Bilateral 08/2018, 09/2018   FOOT SURGERY Left    10/2019   KNEE ARTHROSCOPY Left    LEFT HEART CATH AND CORONARY ANGIOGRAPHY N/A 01/20/2019   Procedure: LEFT HEART CATH AND CORONARY ANGIOGRAPHY;  Surgeon: Claudene Victory ORN, MD;  Location: [facility];  Service: Cardiovascular;  Laterality: N/A;   LYMPH NODE DISSECTION     OOPHORECTOMY     Right total knee replacement     RIGHT/LEFT HEART CATH AND CORONARY ANGIOGRAPHY N/A 05/03/2022   Procedure: RIGHT/LEFT HEART CATH AND CORONARY ANGIOGRAPHY;  Surgeon: Wendel Lurena POUR, MD;  Location: [facility];  Service: Cardiovascular;  Laterality: N/A;   ROUX-EN-Y GASTRIC BYPASS  2011   SKIN TAG REMOVAL     THYROIDECTOMY     TONSILLECTOMY AND ADENOIDECTOMY     TOOTH EXTRACTION  2023   UPPER GASTROINTESTINAL ENDOSCOPY     UPPER GI ENDOSCOPY  2024    Family History family history includes Bladder Cancer in her sister; Breast cancer in her maternal aunt and sister; Colon cancer in her maternal uncle; Congestive Heart Failure in her father; Diabetes in her father, maternal aunt, maternal grandmother, maternal uncle, mother, and paternal grandmother; Hypertension in her father; Liver cancer in her maternal aunt; Multiple sclerosis in her sister; Myelodysplastic syndrome in her father; Neuropathy in her mother and paternal aunt; Osteoporosis in her sister.  Social  History Social History   Socioeconomic History   Marital status: Single    Spouse name: Not on file   Number of children: 0   Years of education: 16   Highest education level: Bachelor's degree (e.g., BA, AB, BS)  Occupational History   Occupation: Research scientist (medical)   Occupation: Retired  Tobacco Use   Smoking status: Former    Current packs/day: 0.00    Average packs/day: 1.5 packs/day for 15.0 years (22.5 ttl pk-yrs)    Types: Cigarettes    Start date: 87    Quit date: 1992    Years since quitting: 33.6    Passive exposure:  Never   Smokeless tobacco: Never  Vaping Use   Vaping status: Never Used  Substance and Sexual Activity   Alcohol use: Yes    Comment: rarely   Drug use: Never   Sexual activity: Not Currently  Other Topics Concern   Not on file  Social History Narrative   Not on file   Social Drivers of Health   Financial Resource Strain: Low Risk  (05/06/2024)   Overall Financial Resource Strain (CARDIA)    Difficulty of Paying Living Expenses: Not very hard  Food Insecurity: No Food Insecurity (05/06/2024)   Hunger Vital Sign    Worried About Running Out of Food in the Last Year: Never true    Ran Out of Food in the Last Year: Never true  Transportation Needs: No Transportation Needs (05/06/2024)   PRAPARE - Administrator, Civil Service (Medical): No    Lack of Transportation (Non-Medical): No  Physical Activity: Inactive (05/06/2024)   Exercise Vital Sign    Days of Exercise per Week: 0 days    Minutes of Exercise per Session: Not on file  Stress: No Stress Concern Present (05/06/2024)   [employer] of Occupational Health - Occupational Stress Questionnaire    Feeling of Stress: Only a little  Social Connections: Moderately Integrated (05/06/2024)   Social Connection and Isolation Panel    Frequency of Communication with Friends and Family: More than three times a week    Frequency of Social Gatherings with Friends and Family: Three times a  week    Attends Religious Services: More than 4 times per year    Active Member of Clubs or Organizations: Yes    Attends Banker Meetings: More than 4 times per year    Marital Status: Never married  Intimate Partner Violence: Not At Risk (05/19/2020)   Humiliation, Afraid, Rape, and Kick questionnaire    Fear of Current or Ex-Partner: No    Emotionally Abused: No    Physically Abused: No    Sexually Abused: No    Lab Results  Component Value Date   HGBA1C 8.9 (A) 10/17/2023   HGBA1C 7.1 (H) 02/22/2023   HGBA1C 7.5 (A) 10/15/2022   Lab Results  Component Value Date   CHOL 120 02/20/2024   Lab Results  Component Value Date   HDL 44 (L) 02/20/2024   Lab Results  Component Value Date   LDLCALC 58 02/20/2024   Lab Results  Component Value Date   TRIG 94 02/20/2024   Lab Results  Component Value Date   CHOLHDL 2.7 02/20/2024   Lab Results  Component Value Date   CREATININE 1.29 (H) 05/07/2024   Lab Results  Component Value Date   GFR 42.20 (L) 05/07/2024   Lab Results  Component Value Date   MICROALBUR 0.7 05/07/2024      Component Value Date/Time   NA 142 05/07/2024 1243   NA 141 05/01/2022 0943   K 4.1 05/07/2024 1243   CL 111 05/07/2024 1243   CO2 25 05/07/2024 1243   GLUCOSE 78 05/07/2024 1243   BUN 17 05/07/2024 1243   BUN 22 05/01/2022 0943   CREATININE 1.29 (H) 05/07/2024 1243   CREATININE 1.51 (H) 02/22/2023 1513   CALCIUM  8.4 05/07/2024 1243   CALCIUM  8.9 01/30/2013 1017   PROT 6.3 05/07/2024 1243   PROT 5.8 (L) 10/09/2021 1214   ALBUMIN 4.0 05/07/2024 1243   ALBUMIN 4.0 10/09/2021 1214   AST 52 (H) 05/07/2024 1243   AST  25 02/29/2020 1303   ALT 75 (H) 05/07/2024 1243   ALT 37 02/29/2020 1303   ALKPHOS 173 (H) 05/07/2024 1243   BILITOT 0.3 05/07/2024 1243   BILITOT 0.4 10/09/2021 1214   BILITOT 0.4 02/29/2020 1303   GFRNONAA >60 05/13/2021 0645   GFRNONAA 63 08/16/2020 1207   GFRAA 73 08/16/2020 1207      Latest Ref Rng  & Units 05/07/2024   12:43 PM 04/03/2023    4:31 PM 02/22/2023    3:13 PM  BMP  Glucose 70 - 99 mg/dL 78  828  811   BUN 6 - 23 mg/dL 17  27  19    Creatinine 0.40 - 1.20 mg/dL 8.70  8.21  8.48   BUN/Creat Ratio 6 - 22 (calc)   13   Sodium 135 - 145 mEq/L 142  138  139   Potassium 3.5 - 5.1 mEq/L 4.1  5.0  4.9   Chloride 96 - 112 mEq/L 111  107  109   CO2 19 - 32 mEq/L 25  23  24    Calcium  8.4 - 10.5 mg/dL 8.4  9.1  8.7        Component Value Date/Time   WBC 4.7 05/07/2024 1243   RBC 3.56 (L) 05/07/2024 1243   HGB 11.1 (L) 05/07/2024 1243   HGB 13.6 05/01/2022 0943   HCT 34.1 (L) 05/07/2024 1243   HCT 40.5 05/01/2022 0943   PLT 179.0 05/07/2024 1243   PLT 187 05/01/2022 0943   MCV 95.7 05/07/2024 1243   MCV 96 05/01/2022 0943   MCH 31.8 02/22/2023 1513   MCHC 32.5 05/07/2024 1243   RDW 14.8 05/07/2024 1243   RDW 13.0 05/01/2022 0943   LYMPHSABS 1.4 04/03/2023 1631   LYMPHSABS 1.2 05/01/2022 0943   MONOABS 0.4 04/03/2023 1631   EOSABS 0.4 04/03/2023 1631   EOSABS 0.3 05/01/2022 0943   BASOSABS 0.0 04/03/2023 1631   BASOSABS 0.0 05/01/2022 0943     Parts of this note may have been dictated using voice recognition software. There may be variances in spelling and vocabulary which are unintentional. Not all errors are proofread. Please notify the dino if any discrepancies are noted or if the meaning of any statement is not clear.

## 2024-06-22 NOTE — Patient Instructions (Signed)
 Will recommend the following: Trulicity  4.5 mg weekly Lantus  25 units qam  Humalog  15 units with break fast and 15 units with lunch, 20-22 with supper 15 min before meals  Correction scale: Use in addition to your meal time/short acting insulin  based on blood sugars as follows: 201 - 225: 3 units 226 - 250: 4 units 251 - 275: 5 units 276 - 300: 6 units 301 - 325: 7 units 326 - 350: 8 units 351 - 375: 9 units 376 - 400: 10 units

## 2024-06-23 NOTE — Progress Notes (Signed)
 [facility] New Patient Exam & Information Gathering Appointment  Savannah [name] is a 68 y.o. female identified by name who presents to the [facility] for a dental exam and information gathering appointment.  SUBJECTIVE Chief Complaint: Pain in UR and UL from extraction sites that haven't healed. Missing upper teeth. Previous RPD was cast metal and she had an allergic reaction so she wants a new one made.  Last Dental Visit: Apr 09, 2024 Prophy at outside office  Past Medical History[1]  Past Surgical History[2]  Allergies[3]  Current Medications[4] Patient reports not using amoxicillin  for antibiotic prophylaxis as prescribed by orthopedic surgeon due to past yeast infections.  Social History   Tobacco Use  . Smoking status: Former    Types: Cigarettes  . Smokeless tobacco: Never  Substance Use Topics  . Alcohol use: Yes    Comment: once every few months    Family History[5]  ASA classification: ASA 3 - Patient with moderate systemic disease with functional limitations  OBJECTIVE Review of Systems: ROS Vitals:   06/23/24 1351 06/23/24 1408  BP: 146/81 135/76  BP Site: R Arm R Arm  BP Position: Sitting Sitting  Pulse: 84 81    Extraoral Exam: Skin of face: without lesions Endocrine: without thyromegaly Lymph nodes: without swelling, tenderness  Intraoral Exam: Oral mucosa: small masse on inner lip towards left corner. Pt. reports it has been there her whole life and sometimes she bites it. Gingiva: ulceration at #14 extraction site Lips: normal Tongue: without ulceration or masses, normally papillated Vestibule: without swelling Occlusion: stable, poor function Teeth: see odontogram Salivary glands: without swelling, masses, or obstruction Oropharynx: normal Oral Hygiene: Poor. Brushes but does not floss  TMJ:     Pre-auricular pain: (-) negative     Clicking:  + left      Crepitus: (-) negative     Open/closed  lock: without       Muscles of Mastiation tenderness: none     Deviation: none  Dental Exam Occlusion  Right molar: unable to assess   Left molar: unable to assess   Right canine: class I   Left canine: class I   Midline deviation: no midline deviation   Overbite is 3 mm. Overjet is 3 mm. Maxillary crowding: none   Mandibular crowding: none   Maxillary spacing: none   Mandibular spacing: none   No teeth in crossbite   Teeth in occlusion: all erupted teeth   Overall occlusal relationship: mild impairment   Non-erosion attrition is none.   Erosion is none. Maxillary ortho treatment: none   Mandibular ortho treatment: none    Radiographs exposed: 4 H-BWX, 3 PAs   ASSESSMENT Caries: see odontogram Pain and sensitivity to biting in UL that has not subsided since extractions of #2 and #3 four years ago. She believes she had a sinus lift and bone grafting with those extractions in preparation for implant placement, but neither are evident radiographically.  PLAN Procedures completed today:  Health history Vitals Intraoral and extraoral soft tissue exams Occlusal exam Dental exam and charting (updated odontogram) Radiographs exposed Bitewings x4 and Periapical teeth #4,15, 31  Warm salt water rinses and massaging the areas were advised to aid in healing of soft tissue in UR and UL quadrants. The importance of nightly flossing was explained to patient due to presence of numerous interproximal lesions.  Full range of treatment options will be discussed with faculty and presented at the next visit. Questions  invited and answered. Patient tolerated the treatment well and left in stable condition satisfied with today's appointment.  Provider: Nestora Deck, DDS Candidate 2027 Attending: Dr. Rosalene Knack, DDS  Next Visit: 07/20/24 at 1:30 pm Periodontal probing, Perio consult for #19, treatment plan presentation       [1] Past Medical History: Diagnosis Date  . Anemia    iron   infusions  . Arthritis   . Autoimmune disease ([facility]HS-HCC)    lichen sclerosis  . Cardiovascular disease   . Diabetes mellitus       . GERD (gastroesophageal reflux disease)   . GI disease    bariatric surgery in 2011  . Hypotension   . Hypothyroid   . Osteoporosis   . Uterine cancer     04/12/1996  [2] Past Surgical History: Procedure Laterality Date  . ARTHROSCOPICALLY AIDED REPAIR TIBIAL LESION Left 2001   Arthritis  . CHOLECYSTECTOMY  04/12/2021   Gaul bladder removal  . heart stent     Placed June 2023  . HYSTERECTOMY  1994  . PLANTAR FASCIA SURGERY  10/13/2019  . REPLACEMENT TOTAL KNEE  2001  . ROUX-EN-Y PROCEDURE  2011  . THYROIDECTOMY  2011  . TONSILLECTOMY  1963  . TOTAL ABDOMINAL HYSTERECTOMY W/ BILATERAL SALPINGOOPHORECTOMY  1994  [3] Allergies Allergen Reactions  . Grass Pollen-June Grass Standard   . Antihistamines - Alkylamine   . Caffeine     PVCs   . Humibid Er [Guaifenesin]     PVCs  . Nsaids (Non-Steroidal Anti-Inflammatory Drug)     Gastric Bypass Surgery - unable to take, tolerates ec 81 aspirin    . Other     Antihistamine-alkylamine - PVCs tolerates benadryl    . Ace Inhibitors Cough  . Ciprofloxacin Itching  . Morphine  Itching    Pt tolerates medication if given with diphenhydramine   [4]  Current Outpatient Medications:  .  atorvastatin  (LIPITOR) 40 MG tablet, atorvastatin  40 mg tablet, Disp: , Rfl:  .  blood sugar diagnostic (ONETOUCH ULTRA BLUE TEST STRIP) Strp, USE TO MONITOR GLUCOSE LEVELS ONCE PER DAY E11.40, Disp: , Rfl:  .  cholecalciferol, vitamin D3-125 mcg, 5,000 unit,, 125 mcg (5,000 unit) capsule, Take 1 capsule (125 mcg total) by mouth., Disp: , Rfl:  .  clopidogrel  (PLAVIX ) 75 mg tablet, Take 1 tablet (75 mg total) by mouth daily., Disp: , Rfl:  .  diclofenac  sodium (VOLTAREN ) 1 % gel, Apply 4 g topically., Disp: , Rfl:  .  dulaglutide  4.5 mg/0.5 mL PnIj, Trulicity  take on Fridays, Disp: , Rfl:  .  gabapentin  (NEURONTIN ) 600 MG  tablet, Three (3) times a day., Disp: , Rfl:  .  halobetasol (ULTRAVATE) 0.05 % ointment, APPLY TO AFFECTED AREA UP TO TWICE DAILY AS NEEDED FOR ITCHING, Disp: , Rfl:  .  insulin  lispro (HUMALOG ) 100 unit/mL injection pen, INJECT 5 UNITS SUBCUTANEOUSLY THREE TIMES DAILY, Disp: , Rfl:  .  LANTUS  SOLOSTAR U-100 INSULIN  100 unit/mL (3 mL) injection pen, Inject 0.25 mL (25 Units total) under the skin., Disp: , Rfl:  .  levothyroxine  (SYNTHROID ) 125 MCG tablet, Take 1 tablet (125 mcg total) by mouth daily., Disp: , Rfl:  .  pantoprazole  (PROTONIX ) 40 MG tablet, , Disp: , Rfl:  .  phenazopyridine  (PYRIDIUM ) 100 MG tablet, Take 1 tablet (100 mg total) by mouth., Disp: , Rfl:  .  traMADoL  (ULTRAM ) 50 mg tablet, tramadol  50 mg tablet, Disp: , Rfl:  .  amoxicillin  (AMOXIL ) 500 MG capsule, TAKE 4 CAPSULES BY MOUTH ONE  HOUR BEFORE DENTAL APPT. (Patient not taking: Reported on 06/23/2024), Disp: , Rfl:  .  canagliflozin  (INVOKANA ) 300 mg Tab tablet, 0.5 tablets (150 mg total)., Disp: , Rfl:  .  levothyroxine  (SYNTHROID ) 112 MCG tablet, levothyroxine  112 mcg tablet, Disp: , Rfl:  .  metFORMIN  (GLUCOPHAGE ) 1000 MG tablet, Take 1 tablet (1,000 mg total) by mouth., Disp: , Rfl:  .  nitroglycerin  (NITROSTAT ) 0.4 MG SL tablet, nitroglycerin  0.4 mg sublingual tablet (Patient not taking: Reported on 01/02/2023), Disp: , Rfl:  .  repaglinide  (PRANDIN ) 2 MG tablet, Three (3) times a day., Disp: , Rfl:  [5] Family History Problem Relation Age of Onset  . Heart disease Mother   . Myelodysplastic syndrome Father   . Multiple sclerosis Sister   . Breast cancer Sister

## 2024-06-24 DIAGNOSIS — H353131 Nonexudative age-related macular degeneration, bilateral, early dry stage: Secondary | ICD-10-CM | POA: Diagnosis not present

## 2024-06-24 DIAGNOSIS — Z961 Presence of intraocular lens: Secondary | ICD-10-CM | POA: Diagnosis not present

## 2024-06-24 DIAGNOSIS — H04123 Dry eye syndrome of bilateral lacrimal glands: Secondary | ICD-10-CM | POA: Diagnosis not present

## 2024-06-24 DIAGNOSIS — E119 Type 2 diabetes mellitus without complications: Secondary | ICD-10-CM | POA: Diagnosis not present

## 2024-06-24 DIAGNOSIS — D3132 Benign neoplasm of left choroid: Secondary | ICD-10-CM | POA: Diagnosis not present

## 2024-06-24 DIAGNOSIS — I890 Lymphedema, not elsewhere classified: Secondary | ICD-10-CM | POA: Diagnosis not present

## 2024-06-25 DIAGNOSIS — L900 Lichen sclerosus et atrophicus: Secondary | ICD-10-CM | POA: Diagnosis not present

## 2024-06-25 DIAGNOSIS — I872 Venous insufficiency (chronic) (peripheral): Secondary | ICD-10-CM | POA: Diagnosis not present

## 2024-06-25 DIAGNOSIS — R609 Edema, unspecified: Secondary | ICD-10-CM | POA: Diagnosis not present

## 2024-06-26 ENCOUNTER — Other Ambulatory Visit: Payer: Self-pay

## 2024-06-26 DIAGNOSIS — E1165 Type 2 diabetes mellitus with hyperglycemia: Secondary | ICD-10-CM

## 2024-06-26 MED ORDER — LANTUS SOLOSTAR 100 UNIT/ML ~~LOC~~ SOPN: 25.0000 [IU] | PEN_INJECTOR | Freq: Every day | SUBCUTANEOUS | 1 refills | Status: DC

## 2024-06-28 NOTE — Progress Notes (Unsigned)
 [facility] [phone]   [phone]  CONSULT NOTE  REFERRING PHYSICIAN: Dr. Kennyth  REASON FOR CONSULTATION:  IDA  HPI Savannah [name] is a 68 y.o. female with a past medical history significant for gastric bypass surgery, hysterectomy, pseudogout, lichen sclerosis, TMJ, uterine cancer, GERD with esophagitis, diabetes mellitus, arthritis, and thyroidectomy is referred to clinic for evaluation of IDA.   Of note, the patient was seen in the clinic by Dr. Sherrod and myself in 2021-2022. She was lost to follow up since 2022. At that time, she was referred for leukocytopenia which she had majority of her life. She had a bone marrow biopsy and aspirate performed a few years ago which was not concerning. Of note, her father had MDS.   She was recently re-referred to the clinic for IDA. She had labs performed in June 2025 which showed mild anemia and IDA. Her Hbg was 11.1. She thought she had labs performed in April 2025 and her Hbg was ~12 so she was concerned she dropped a gram. She has a history of gastric bypass surgery. She has received IV iron  PRN, the most recent being on 06/16/20 through our office. She has a history of bariatric surgery. Since she has malabsorption, she does not take iron  but does take a multivitamin.   At her prior appointments, she reports easy bruising since her late 36's. Dr. Sherrod recommended she see Dr. Darcy at [facility] for coagulapathy.  She did see them and reports her work up was reassuring.    She denies any epistaxis, hemoptysis, hematemesis, melena, or hematochezia.  She notes some gingival bleeding occasionally when she brushes her teeth but this is better than prior with flossing.  She does have a predisposition for easy bruising and senile purpura.  She has some bruising on her upper extremity and abdomen as she is now using insulin .  Does also use steroid cream for lichen sclerosis on her extremities.  She is not on  any immunosuppressive drugs and does not take prednisone  on a consistent basis.  She struggles with lymphedema due to history of uterine cancer.  She is due for colonoscopy soon.  She has an appointment with her gastroenterologist next week and is expected to have a colonoscopy in the near future since her last was in 2018 and she is due this year.  She is also having some ongoing GI issues related to diarrhea which has been going on for a year.  She had an upper endoscopy performed in 2024 which did not show any concerns for bleeding.   She is on plavix  for her CAD.  She denies any dietary restrictions but does not eat a lot of red meat preference.  She does eat beef.  She does report craving ice chips.  Her family history significant for a father who had myelodysplastic syndrome.  She states that her father had this for approximately 10 years prior to his passing.  Her father died at the age of 54.  Her father also had hypertension, questionable CHF, and diabetes.  The patient's paternal aunt had leukemia.  The patient's mother had diabetes.    The patient is a Scientist, clinical (histocompatibility and immunogenetics) by training. She is single and does not have any children. She denies any history of drug use. She denies any significant alcohol use. She smoked for approximately 17 years 1.5 packs/day. She quit in 1994.    HPI  Past Medical History:  Diagnosis Date  ABDOMINAL PAIN, CHRONIC 10/20/2009   Acute cystitis 08/17/2009   ALLERGIC RHINITIS 11/21/2007   Allergy     ASYMPTOMATIC POSTMENOPAUSAL STATUS 09/29/2008   Bariatric surgery status 07/07/2010   Bariatric surgery status 07/07/2010   Qualifier: Diagnosis of  By: Kassie MD, Sean A    Cancer [facility])    uterine   Cataract    Coronary artery disease    DEPRESSION 11/21/2007   DIABETES MELLITUS, TYPE II 07/08/2007   DYSPNEA 05/20/2009   Edema 05/20/2009   Eosinophilic esophagitis 02/13/2008   FEVER UNSPECIFIED 10/20/2009   FEVER, HX OF 03/09/2010   GERD (gastroesophageal  reflux disease)    Headache(784.0) 02/06/2010   Hepatomegaly 01/06/2008   HYPERLIPIDEMIA 07/08/2007   HYPERTENSION 07/08/2007   Kidney disease    followed by nephrology, per patient   LEUKOPENIA, MILD 09/29/2008   Lymphedema    left leg, dx approximately in 03/2023 per patient   Melanoma in situ of left upper arm (HCC) 04/03/2023   OBESITY 01/06/2008   OSTEOARTHRITIS 01/06/2008   Other chronic nonalcoholic liver disease 01/06/2008   PERIPHERAL NEUROPATHY 11/21/2007   Peripheral neuropathy    Postsurgical hypothyroidism 09/19/2010   SINUSITIS- ACUTE-NOS 11/21/2007   TB SKIN TEST, POSITIVE 02/06/2010   THYROID  NODULE 03/09/2010    Past Surgical History:  Procedure Laterality Date   ABDOMINAL HYSTERECTOMY     BASAL CELL CARCINOMA EXCISION     CARDIAC CATHETERIZATION     CHOLECYSTECTOMY N/A 05/11/2021   Procedure: LAPAROSCOPIC CHOLECYSTECTOMY WITH POSSIBLE  INTRAOPERATIVE CHOLANGIOGRAM;  Surgeon: Gladis Cough, MD;  Location: [facility];  Service: General;  Laterality: N/A;   COLONOSCOPY     CORONARY ATHERECTOMY N/A 05/03/2022   Procedure: CORONARY ATHERECTOMY;  Surgeon: Wendel Lurena POUR, MD;  Location: [facility];  Service: Cardiovascular;  Laterality: N/A;   CORONARY IMAGING/OCT N/A 05/03/2022   Procedure: INTRAVASCULAR IMAGING/OCT;  Surgeon: Wendel Lurena POUR, MD;  Location: [facility];  Service: Cardiovascular;  Laterality: N/A;   CORONARY PRESSURE/FFR STUDY N/A 05/03/2022   Procedure: INTRAVASCULAR PRESSURE WIRE/FFR STUDY;  Surgeon: Wendel Lurena POUR, MD;  Location: [facility];  Service: Cardiovascular;  Laterality: N/A;   CORONARY STENT INTERVENTION N/A 05/03/2022   Procedure: CORONARY STENT INTERVENTION;  Surgeon: Wendel Lurena POUR, MD;  Location: [facility];  Service: Cardiovascular;  Laterality: N/A;  lad   EYE SURGERY Bilateral 08/2018, 09/2018   FOOT SURGERY Left    10/2019   KNEE ARTHROSCOPY Left    LEFT HEART CATH AND CORONARY ANGIOGRAPHY  N/A 01/20/2019   Procedure: LEFT HEART CATH AND CORONARY ANGIOGRAPHY;  Surgeon: Claudene Victory ORN, MD;  Location: [facility];  Service: Cardiovascular;  Laterality: N/A;   LYMPH NODE DISSECTION     OOPHORECTOMY     Right total knee replacement     RIGHT/LEFT HEART CATH AND CORONARY ANGIOGRAPHY N/A 05/03/2022   Procedure: RIGHT/LEFT HEART CATH AND CORONARY ANGIOGRAPHY;  Surgeon: Wendel Lurena POUR, MD;  Location: [facility];  Service: Cardiovascular;  Laterality: N/A;   ROUX-EN-Y GASTRIC BYPASS  2011   SKIN TAG REMOVAL     THYROIDECTOMY     TONSILLECTOMY AND ADENOIDECTOMY     TOOTH EXTRACTION  2023   UPPER GASTROINTESTINAL ENDOSCOPY     UPPER GI ENDOSCOPY  2024    Family History  Problem Relation Age of Onset   Diabetes Mother    Neuropathy Mother    Diabetes Father    Congestive Heart Failure Father  Hypertension Father    Myelodysplastic syndrome Father    Bladder Cancer Sister    Multiple sclerosis Sister    Breast cancer Sister        breast onset age 60   Osteoporosis Sister    Liver cancer Maternal Aunt    Breast cancer Maternal Aunt    Diabetes Maternal Aunt    Colon cancer Maternal Uncle    Diabetes Maternal Uncle    Neuropathy Paternal Aunt    Diabetes Maternal Grandmother    Diabetes Paternal Grandmother     Social History Social History   Tobacco Use   Smoking status: Former    Current packs/day: 0.00    Average packs/day: 1.5 packs/day for 15.0 years (22.5 ttl pk-yrs)    Types: Cigarettes    Start date: 11    Quit date: 54    Years since quitting: 33.6    Passive exposure: Never   Smokeless tobacco: Never  Vaping Use   Vaping status: Never Used  Substance Use Topics   Alcohol use: Yes    Comment: rarely   Drug use: Never    Allergies  Allergen Reactions   Ace Inhibitors Cough   Caffeine     PVCs   Ciprofloxacin Itching   Morphine  And Codeine Itching    Pt tolerates medication if given with diphenhydramine    Mucinex  [Guaifenesin Er]     PVCs   Nsaids     Gastric Bypass Surgery - unable to take, tolerates ec 81 aspirin     Other     Antihistamine-alkylamine - PVCs tolerates benadryl     Silicone Hives   Tape Hives    Current Outpatient Medications  Medication Sig Dispense Refill   acetaminophen  (TYLENOL ) 500 MG tablet Take 2 tablets (1,000 mg total) by mouth every 6 (six) hours as needed for mild pain or fever. 30 tablet 0   atorvastatin  (LIPITOR) 40 MG tablet Take 1 tablet (40 mg total) by mouth daily. 90 tablet 3   blood glucose meter kit and supplies KIT Use up to three  times daily as directed. 1 each 0   Blood Glucose Monitoring Suppl DEVI 1 each by Does not apply route in the morning, at noon, and at bedtime. May substitute to any manufacturer covered by patient's insurance. 1 each 0   cetirizine  (ZYRTEC ) 10 MG tablet TAKE 1 TABLET BY MOUTH EVERY DAY 90 tablet 4   Cholecalciferol (VITAMIN D ) 50 MCG (2000 UT) tablet Take 4,000 Units by mouth in the morning.     clopidogrel  (PLAVIX ) 75 MG tablet Take 1 tablet (75 mg total) by mouth daily. 90 tablet 2   clotrimazole  (MYCELEX ) 10 MG troche Take 1 tablet (10 mg total) by mouth 5 (five) times daily. 35 Troche 0   Colchicine  (MITIGARE ) 0.6 MG CAPS Take by mouth as needed.     Continuous Glucose Sensor (DEXCOM G7 SENSOR) MISC 1 Device by Does not apply route See admin instructions. 1 each 0   cyclobenzaprine  (FLEXERIL ) 10 MG tablet Take 10 mg by mouth at bedtime as needed for muscle spasms.     diclofenac  sodium (VOLTAREN ) 1 % GEL Apply 4 g topically 4 (four) times daily. (Patient taking differently: Apply 4 g topically 4 (four) times daily. AS NEEDED) 100 g 11   docusate sodium (COLACE) 100 MG capsule Take 100 mg by mouth 2 (two) times daily.     Dulaglutide  (TRULICITY ) 4.5 MG/0.5ML SOAJ Inject 4.5 mg as directed once a week. 6 mL 1  fluconazole  (DIFLUCAN ) 200 MG tablet Take 200 mg by mouth as needed.     fluorometholone (FML) 0.1 % ophthalmic suspension  Place 1 drop into both eyes as needed.     fluticasone  (FLONASE ) 50 MCG/ACT nasal spray SPRAY 2 SPRAYS INTO EACH NOSTRIL EVERY DAY 48 mL 3   gabapentin  (NEURONTIN ) 300 MG capsule Take 300 mg by mouth 3 (three) times daily.     gabapentin  (NEURONTIN ) 600 MG tablet Take 600 mg by mouth 2 (two) times daily. (Patient taking differently: Take 600 mg by mouth 2 (two) times daily. PATIENT TAKING 1200 MG THREE TIMES DAY)     Glucagon  (BAQSIMI  ONE PACK) 3 MG/DOSE POWD Place 1 Device into the nose as needed (Low blood sugar with impaired consciousness). 2 each 3   Halobetasol & Lactic Acid (ULTRAVATE X, OINTMENT, EX) Apply 1 Application topically 2 (two) times daily.     HYDROcodone -acetaminophen  (NORCO/VICODIN) 5-325 MG tablet Take 1 tablet by mouth as needed for moderate pain (pain score 4-6).     insulin  glargine (LANTUS  SOLOSTAR) 100 UNIT/ML Solostar Pen Inject 25 Units into the skin daily. 24 mL 1   insulin  lispro (HUMALOG ) 100 UNIT/ML KwikPen INJECT 5 UNITS SUBCUTANEOUSLY THREE TIMES DAILY 15 mL 0   Insulin  Pen Needle (BD PEN NEEDLE MINI ULTRAFINE) 31G X 5 MM MISC Use as directed with insulin . 100 each 11   ipratropium (ATROVENT) 0.06 % nasal spray Place 1 spray into both nostrils as needed.     levalbuterol  (XOPENEX  HFA) 45 MCG/ACT inhaler Inhale 1 puff into the lungs every 4 (four) hours as needed for wheezing. 1 each 12   levocetirizine (XYZAL ) 5 MG tablet Take 1 tablet (5 mg total) by mouth every evening. 90 tablet 1   levothyroxine  (SYNTHROID ) 125 MCG tablet Take 1 tablet (125 mcg total) by mouth daily. 90 tablet 1   lidocaine  (LIDODERM ) 5 % PLACE 1 PATCH ONTO THE SKIN DAILY. REMOVE & DISCARD PATCH WITHIN 12 HOURS OR AS DIRECTED BY MD (Patient taking differently: Place 1 patch onto the skin daily. Remove & Discard patch within 12 hours or as directed by MD  PATIENT TAKING AS NEEDED) 30 patch 0   MAGNESIUM PO Take 3 tablets by mouth daily as needed (foot cramps).     methocarbamol (ROBAXIN) 750 MG  tablet Take 750 mg by mouth every 6 (six) hours as needed.     Multiple Vitamin (MULTIVITAMIN) tablet Take 1 tablet by mouth in the morning and at bedtime.     nitrofurantoin , macrocrystal-monohydrate, (MACROBID ) 100 MG capsule Take 100 mg by mouth every 12 (twelve) hours.     nitroGLYCERIN  (NITROSTAT ) 0.4 MG SL tablet Place 1 tablet (0.4 mg total) under the tongue every 5 (five) minutes as needed for chest pain. 25 tablet 3   pantoprazole  (PROTONIX ) 40 MG tablet Take 1 tablet (40 mg total) by mouth 2 (two) times daily. 180 tablet 0   phenazopyridine  (PYRIDIUM ) 100 MG tablet Take 1 tablet (100 mg total) by mouth 3 (three) times daily with meals. 9 tablet 0   predniSONE  (DELTASONE ) 5 MG tablet Take 4 tabs po x 2 days, 3  tabs po x 2 days, 2  tabs po x 2 days, 1  tab po x 2 days 20 tablet 0   traMADol  (ULTRAM ) 50 MG tablet Take 50 mg by mouth every 6 (six) hours as needed (pain).     No current facility-administered medications for this visit.    REVIEW OF SYSTEMS:   Review  of Systems  Constitutional: Positive for fatigue. Negative for appetite change, chills,fever and unexpected weight change.  HENT: Improved gingival bleeding. Negative for mouth sores, nosebleeds, sore throat and trouble swallowing.   Eyes: Negative for eye problems and icterus.  Respiratory: Negative for cough, hemoptysis, shortness of breath and wheezing.   Cardiovascular: Negative for chest pain. Positive for lymphedema.  Gastrointestinal: Abdominal discomfort/diarrhea. Genitourinary: Negative for bladder incontinence, difficulty urinating, dysuria, frequency and hematuria.   Musculoskeletal: Negative for back pain, gait problem, neck pain and neck stiffness.  Skin: Negative for itching and rash.  Neurological: Negative for dizziness, extremity weakness, gait problem, Hematological: Positive for bruising on upper arms bilaterally. Negative for adenopathy.  Psychiatric/Behavioral: Negative for confusion, depression and  sleep disturbance. The patient is not nervous/anxious.     PHYSICAL EXAMINATION:  There were no vitals taken for this visit.  ECOG PERFORMANCE STATUS: 1  Physical Exam  Constitutional: Oriented to person, place, and time and well-developed, well-nourished, and in no distress.  HENT:  Head: Normocephalic and atraumatic.  Mouth/Throat: Oropharynx is clear and moist. No oropharyngeal exudate.  Eyes: Conjunctivae are normal. Right eye exhibits no discharge. Left eye exhibits no discharge. No scleral icterus.  Neck: Normal range of motion. Neck supple.  Cardiovascular: Normal rate, regular rhythm, normal heart sounds and intact distal pulses.   Pulmonary/Chest: Effort normal and breath sounds normal. No respiratory distress. No wheezes. No rales.  Abdominal: Soft. Bowel sounds are normal. Exhibits no distension and no mass. There is no tenderness.  Musculoskeletal: Positive for bilaterally lower extremity swelling. Normal range of motion.  Lymphadenopathy:    No cervical adenopathy.  Neurological: Alert and oriented to person, place, and time. Exhibits normal muscle tone. Gait normal. Coordination normal.  Skin: Positive for bruising on upper extremities bilaterally. Skin is warm and dry. No rash noted. Not diaphoretic. No erythema. No pallor.  Psychiatric: Mood, memory and judgment normal.  Vitals reviewed.  LABORATORY DATA: Lab Results  Component Value Date   WBC 4.7 05/07/2024   HGB 11.1 (L) 05/07/2024   HCT 34.1 (L) 05/07/2024   MCV 95.7 05/07/2024   PLT 179.0 05/07/2024      Chemistry      Component Value Date/Time   NA 142 05/07/2024 1243   NA 141 05/01/2022 0943   K 4.1 05/07/2024 1243   CL 111 05/07/2024 1243   CO2 25 05/07/2024 1243   BUN 17 05/07/2024 1243   BUN 22 05/01/2022 0943   CREATININE 1.29 (H) 05/07/2024 1243   CREATININE 1.51 (H) 02/22/2023 1513      Component Value Date/Time   CALCIUM  8.4 05/07/2024 1243   CALCIUM  8.9 01/30/2013 1017   ALKPHOS 173  (H) 05/07/2024 1243   AST 52 (H) 05/07/2024 1243   AST 25 02/29/2020 1303   ALT 75 (H) 05/07/2024 1243   ALT 37 02/29/2020 1303   BILITOT 0.3 05/07/2024 1243   BILITOT 0.4 10/09/2021 1214   BILITOT 0.4 02/29/2020 1303       RADIOGRAPHIC STUDIES: No results found.  ASSESSMENT: This is a very pleasant 68 year old Caucasian female with prior urgency anemia she also has a longstanding history of leukopenia she had a bone marrow biopsy and aspirate performed in 2021. The patient also has a gastric bypass surgery and likely has iron  deficiency anemia malabsorption. She has received IV iron  as needed the most recent being in 2021.  The patient had repeat CBC, iron  studies, B12, folate, and ferritin performed recently which showed stable mild anemia with  a hemoglobin of 11.  Her iron  studies recently resulted and her iron  is normal at 55 and her saturation is on the low end of normal at 11%.  Her ferritin is pending.  If her ferritin is low we will arrange for IV iron  infusions considering she has malabsorption from her history of gastric bypass.  If her ferritin is normal then we will monitor her with repeat blood work in 3 months.  If she requires IV iron  I will arrange for this at the [facility] infusion center.  The patient voices understanding of current disease status and treatment options and is in agreement with the current care plan.  All questions were answered. The patient knows to call the clinic with any problems, questions or concerns. We can certainly see the patient much sooner if necessary.  Thank you so much for allowing me to participate in the care of Savannah [name]. I will continue to follow up the patient with you and assist in her care.   Disclaimer: This note was dictated with voice recognition software. Similar sounding words can inadvertently be transcribed and may not be corrected upon review.   Charlita Brian L Montarius Kitagawa June 28, 2024, 10:18  AM  ADDENDUM: Hematology/Oncology Attending: I had a face-to-face encounter with the patient today.  I reviewed her records, lab and recommended her care plan.  This is a 68 years old white female with past medical history of gastric bypass surgery, hysterectomy, pseudogout, TMJ, uterine cancer, GERD with esophagitis as well as history of diabetes mellitus, osteoarthritis and thyroidectomy.  She was seen in 2022 for similar problem with persistent anemia and leukocytopenia secondary to nutritional deficiency after the gastric bypass surgery.  She had a bone marrow biopsy and aspirate performed at that time that showed no concerning findings for MDS.  There was concern about MDS since her father had the condition.  She was treated for iron  deficiency anemia with iron  infusion in the past since she has malabsorption issues.  She has been doing fine but most recently was seen by her primary care provider and noticed drop in her hemoglobin and hematocrit from last year.  She was referred to us  today for reevaluation and recommendation regarding her condition.  Her iron  study by her primary care provider showed low serum iron , iron  saturation and ferritin level. Repeat CBC and iron  studies today showed hemoglobin of 11.0 and hematocrit 34.5%.  Iron  studies showed normal serum iron  of 55.  Total iron  binding capacity 510 and iron  saturation of 11%.  She has normal vitamin B12 level.  Serum folate and serum ferritin are still pending. If the serum ferritin showed significant deficiency, we will consider The patient for iron  infusion but if it is normal, we will see her back for follow-up visit in 3 months for evaluation and repeat CBC, iron  study and ferritin. The patient was advised to call immediately if she has any other concerning symptoms in the interval. The total time spent in the appointment was 60 minutes including review of chart and various tests results, discussions about plan of care and coordination  of care plan . Disclaimer: This note was dictated with voice recognition software. Similar sounding words can inadvertently be transcribed and may be missed upon review. Sherrod MARLA Sherrod, MD

## 2024-06-29 ENCOUNTER — Other Ambulatory Visit: Payer: Self-pay

## 2024-06-29 ENCOUNTER — Telehealth: Payer: Self-pay | Admitting: "Endocrinology

## 2024-06-29 ENCOUNTER — Inpatient Hospital Stay: Attending: Physician Assistant | Admitting: Physician Assistant

## 2024-06-29 ENCOUNTER — Inpatient Hospital Stay

## 2024-06-29 VITALS — BP 154/73 | HR 70 | Temp 97.1°F | Resp 17 | Ht 71.0 in | Wt 212.6 lb

## 2024-06-29 DIAGNOSIS — Z9884 Bariatric surgery status: Secondary | ICD-10-CM | POA: Diagnosis not present

## 2024-06-29 DIAGNOSIS — I890 Lymphedema, not elsewhere classified: Secondary | ICD-10-CM | POA: Diagnosis not present

## 2024-06-29 DIAGNOSIS — Z806 Family history of leukemia: Secondary | ICD-10-CM | POA: Insufficient documentation

## 2024-06-29 DIAGNOSIS — Z800 Family history of malignant neoplasm of digestive organs: Secondary | ICD-10-CM | POA: Diagnosis not present

## 2024-06-29 DIAGNOSIS — Z87891 Personal history of nicotine dependence: Secondary | ICD-10-CM | POA: Diagnosis not present

## 2024-06-29 DIAGNOSIS — Z7902 Long term (current) use of antithrombotics/antiplatelets: Secondary | ICD-10-CM | POA: Insufficient documentation

## 2024-06-29 DIAGNOSIS — Z803 Family history of malignant neoplasm of breast: Secondary | ICD-10-CM | POA: Insufficient documentation

## 2024-06-29 DIAGNOSIS — D509 Iron deficiency anemia, unspecified: Secondary | ICD-10-CM

## 2024-06-29 DIAGNOSIS — I2510 Atherosclerotic heart disease of native coronary artery without angina pectoris: Secondary | ICD-10-CM | POA: Diagnosis not present

## 2024-06-29 DIAGNOSIS — Z8052 Family history of malignant neoplasm of bladder: Secondary | ICD-10-CM | POA: Insufficient documentation

## 2024-06-29 DIAGNOSIS — D72819 Decreased white blood cell count, unspecified: Secondary | ICD-10-CM

## 2024-06-29 DIAGNOSIS — E1165 Type 2 diabetes mellitus with hyperglycemia: Secondary | ICD-10-CM

## 2024-06-29 LAB — CBC WITH DIFFERENTIAL (CANCER CENTER ONLY)
Abs Immature Granulocytes: 0 K/uL (ref 0.00–0.07)
Basophils Absolute: 0 K/uL (ref 0.0–0.1)
Basophils Relative: 1 %
Eosinophils Absolute: 0.2 K/uL (ref 0.0–0.5)
Eosinophils Relative: 4 %
HCT: 34.5 % — ABNORMAL LOW (ref 36.0–46.0)
Hemoglobin: 11 g/dL — ABNORMAL LOW (ref 12.0–15.0)
Immature Granulocytes: 0 %
Lymphocytes Relative: 23 %
Lymphs Abs: 1 K/uL (ref 0.7–4.0)
MCH: 30.5 pg (ref 26.0–34.0)
MCHC: 31.9 g/dL (ref 30.0–36.0)
MCV: 95.6 fL (ref 80.0–100.0)
Monocytes Absolute: 0.5 K/uL (ref 0.1–1.0)
Monocytes Relative: 10 %
Neutro Abs: 2.7 K/uL (ref 1.7–7.7)
Neutrophils Relative %: 62 %
Platelet Count: 182 K/uL (ref 150–400)
RBC: 3.61 MIL/uL — ABNORMAL LOW (ref 3.87–5.11)
RDW: 14.1 % (ref 11.5–15.5)
WBC Count: 4.5 K/uL (ref 4.0–10.5)
nRBC: 0 % (ref 0.0–0.2)

## 2024-06-29 LAB — VITAMIN B12: Vitamin B-12: 882 pg/mL (ref 180–914)

## 2024-06-29 LAB — FERRITIN: Ferritin: 15 ng/mL (ref 11–307)

## 2024-06-29 LAB — IRON AND IRON BINDING CAPACITY (CC-WL,HP ONLY)
Iron: 55 ug/dL (ref 28–170)
Saturation Ratios: 11 % (ref 10.4–31.8)
TIBC: 510 ug/dL — ABNORMAL HIGH (ref 250–450)
UIBC: 455 ug/dL — ABNORMAL HIGH (ref 148–442)

## 2024-06-29 LAB — FOLATE: Folate: 30.4 ng/mL (ref >5.9)

## 2024-06-29 MED ORDER — INSULIN LISPRO (1 UNIT DIAL) 100 UNIT/ML (KWIKPEN): PEN_INJECTOR | SUBCUTANEOUS | 0 refills | Status: DC

## 2024-06-29 NOTE — Progress Notes (Unsigned)
 Cardiology Clinic Note   Patient Name: Savannah [name] Date of Encounter: 07/01/2024  Primary Care Provider:  Kennyth Worth HERO, MD Primary Cardiologist:  Soyla DELENA Merck, MD  Patient Profile    Savannah [name] presents to the clinic today for evaluation of her shortness of breath.  Past Medical History    Past Medical History:  Diagnosis Date   ABDOMINAL PAIN, CHRONIC 10/20/2009   Acute cystitis 08/17/2009   ALLERGIC RHINITIS 11/21/2007   Allergy     ASYMPTOMATIC POSTMENOPAUSAL STATUS 09/29/2008   Bariatric surgery status 07/07/2010   Bariatric surgery status 07/07/2010   Qualifier: Diagnosis of  By: Kassie MD, Sean A    Cancer [facility])    uterine   Cataract    Coronary artery disease    DEPRESSION 11/21/2007   DIABETES MELLITUS, TYPE II 07/08/2007   DYSPNEA 05/20/2009   Edema 05/20/2009   Eosinophilic esophagitis 02/13/2008   FEVER UNSPECIFIED 10/20/2009   FEVER, HX OF 03/09/2010   GERD (gastroesophageal reflux disease)    Headache(784.0) 02/06/2010   Hepatomegaly 01/06/2008   HYPERLIPIDEMIA 07/08/2007   HYPERTENSION 07/08/2007   Kidney disease    followed by nephrology, per patient   LEUKOPENIA, MILD 09/29/2008   Lymphedema    left leg, dx approximately in 03/2023 per patient   Melanoma in situ of left upper arm (HCC) 04/03/2023   OBESITY 01/06/2008   OSTEOARTHRITIS 01/06/2008   Other chronic nonalcoholic liver disease 01/06/2008   PERIPHERAL NEUROPATHY 11/21/2007   Peripheral neuropathy    Postsurgical hypothyroidism 09/19/2010   SINUSITIS- ACUTE-NOS 11/21/2007   TB SKIN TEST, POSITIVE 02/06/2010   THYROID  NODULE 03/09/2010   Past Surgical History:  Procedure Laterality Date   ABDOMINAL HYSTERECTOMY     BASAL CELL CARCINOMA EXCISION     CARDIAC CATHETERIZATION     CHOLECYSTECTOMY N/A 05/11/2021   Procedure: LAPAROSCOPIC CHOLECYSTECTOMY WITH POSSIBLE  INTRAOPERATIVE CHOLANGIOGRAM;  Surgeon: Gladis Cough, MD;  Location: [facility];  Service: General;   Laterality: N/A;   COLONOSCOPY     CORONARY ATHERECTOMY N/A 05/03/2022   Procedure: CORONARY ATHERECTOMY;  Surgeon: Wendel Lurena POUR, MD;  Location: [facility];  Service: Cardiovascular;  Laterality: N/A;   CORONARY IMAGING/OCT N/A 05/03/2022   Procedure: INTRAVASCULAR IMAGING/OCT;  Surgeon: Wendel Lurena POUR, MD;  Location: [facility] CV [facility];  Service: Cardiovascular;  Laterality: N/A;   CORONARY PRESSURE/FFR STUDY N/A 05/03/2022   Procedure: INTRAVASCULAR PRESSURE WIRE/FFR STUDY;  Surgeon: Wendel Lurena POUR, MD;  Location: [facility];  Service: Cardiovascular;  Laterality: N/A;   CORONARY STENT INTERVENTION N/A 05/03/2022   Procedure: CORONARY STENT INTERVENTION;  Surgeon: Wendel Lurena POUR, MD;  Location: [facility];  Service: Cardiovascular;  Laterality: N/A;  lad   EYE SURGERY Bilateral 08/2018, 09/2018   FOOT SURGERY Left    10/2019   KNEE ARTHROSCOPY Left    LEFT HEART CATH AND CORONARY ANGIOGRAPHY N/A 01/20/2019   Procedure: LEFT HEART CATH AND CORONARY ANGIOGRAPHY;  Surgeon: Claudene Victory ORN, MD;  Location: [facility];  Service: Cardiovascular;  Laterality: N/A;   LYMPH NODE DISSECTION     OOPHORECTOMY     Right total knee replacement     RIGHT/LEFT HEART CATH AND CORONARY ANGIOGRAPHY N/A 05/03/2022   Procedure: RIGHT/LEFT HEART CATH AND CORONARY ANGIOGRAPHY;  Surgeon: Wendel Lurena POUR, MD;  Location: [facility];  Service: Cardiovascular;  Laterality: N/A;   ROUX-EN-Y GASTRIC BYPASS  2011   SKIN TAG REMOVAL  THYROIDECTOMY     TONSILLECTOMY AND ADENOIDECTOMY     TOOTH EXTRACTION  2023   UPPER GASTROINTESTINAL ENDOSCOPY     UPPER GI ENDOSCOPY  2024    Allergies  Allergies  Allergen Reactions   Ace Inhibitors Cough   Caffeine     PVCs   Ciprofloxacin Itching   Morphine  And Codeine Itching    Pt tolerates medication if given with diphenhydramine    Mucinex [Guaifenesin Er]     PVCs   Nsaids     Gastric Bypass Surgery - unable to  take, tolerates ec 81 aspirin     Other     Antihistamine-alkylamine - PVCs tolerates benadryl     Silicone Hives   Tape Hives    History of Present Illness    Savannah [name] has a PMH of coronary artery disease status post stent placement in 2023, hyperlipidemia, diastolic CHF, and HTN.  Patient reported that she had mild regurgitation of her mitral valve.  This was evident in her echocardiogram in 2022.  She was seen in follow-up by Dr. Loni on 11/27/2023.  During that time she noted occasional chest pain which she described as pressure.  It was more pronounced with laying on her back.   it was felt that her pain may be esophageal or MSK in nature.  She reported that she had been experiencing urinary tract infections which were difficult to manage.  She had completed a course of Augmentin .  She did not feel that the infection was fully resolved.  She did note occasional bruising which she attributed to Plavix .  She had been off of her furosemide  for her lymphedema.  Her diabetes was managed by endocrinology.  She was seen by her PCP 05/07/2024.  She reported daily headaches for few months.  She noted that she had head trauma a few months ago.  It was felt that she may have postconcussive syndrome.  Brain MRI as well as CBC, CMP, sedimentation rate and CRP were ordered.  It was felt that she may need a referral to neurology.  Her CBC showed normal WBC RBCs of 3.56 and hemoglobin of 11.1 with hematocrit 34.1.  Her iron  was noted to be slightly low at 41.  Ferritin was also decreased at 7.7.  TIBC was 492.8.  She contacted the cardiology clinic 05/08/24.  She reported shortness of breath.  She was added to my schedule.  She stated over the past few weeks she had noticed more shortness of breath.  She noticed this with mild increased physical activity such as walking.  She had recent head trauma and we reviewed her visit with her PCP the prior day.  She was noted to have anemia at that time.  She also  reported that she had gained around 50 pounds.  She noted that she was not very physically active.  She was concerned at that time with her increased shortness of breath due to her cardiac history.  We reviewed her previous visit and previous stenting.  She expressed understanding. I rescheduled her echocardiogram to 7 /3 and planned her follow-up after echocardiogram.  She did have lower extremity insufficiency and had been following with vein specialist who recommended lower extremity support stockings.  I  gave her the elastic support stockings sheet because she had been having issues with her recently ordered support stockings.  She had not yet scheduled her head MRI.  I planned follow-up after her echocardiogram.  She had plans to go on a trip out west  over the summer.  She presents to the clinic today for follow-up evaluation and states she noticed increased work of breathing at elevation while she was on vacation in the [facility].  She was visiting family and [place]  and then they traveled [place].  She continues to work with the lymphedema clinic and is expecting to receive lymphedema pumps in the next month.  She does have lower extremity 1-2+ pitting edema to the midshin left greater than right which remains stable.  She also received lower extremity support stockings which she is in the process of reordering due to them not being long enough.  We reviewed her echocardiogram.  She expressed understanding.  We reviewed possible mitral valve intervention.  I will have her proceed with TEE with Dr. Loni and keep follow-up as scheduled.  Today she denies chest pain, and increased work of breathing.   Home Medications    Prior to Admission medications   Medication Sig Start Date End Date Taking? Authorizing Provider  acetaminophen  (TYLENOL ) 500 MG tablet Take 2 tablets (1,000 mg total) by mouth every 6 (six) hours as needed for mild pain or fever. 05/13/21   Rai, Ripudeep K, MD  atorvastatin   (LIPITOR) 40 MG tablet Take 1 tablet (40 mg total) by mouth daily. 08/30/23   Acharya, Gayatri A, MD  blood glucose meter kit and supplies KIT Use up to three  times daily as directed. 08/27/23   Kennyth Worth HERO, MD  Blood Glucose Monitoring Suppl DEVI 1 each by Does not apply route in the morning, at noon, and at bedtime. May substitute to any manufacturer covered by patient's insurance. 10/29/23   Motwani, Komal, MD  cetirizine  (ZYRTEC ) 10 MG tablet TAKE 1 TABLET BY MOUTH EVERY DAY 10/22/23   Soldatova, Liuba, MD  Cholecalciferol (VITAMIN D ) 50 MCG (2000 UT) tablet Take 4,000 Units by mouth in the morning.    [provider]  clopidogrel  (PLAVIX ) 75 MG tablet Take 1 tablet (75 mg total) by mouth daily. 02/17/24   Acharya, Gayatri A, MD  clotrimazole  (MYCELEX ) 10 MG troche Take 1 tablet (10 mg total) by mouth 5 (five) times daily. 09/26/23   Nche, Roselie Rockford, NP  Colchicine  (MITIGARE ) 0.6 MG CAPS Take by mouth as needed.    [provider]  Continuous Glucose Sensor (DEXCOM G7 SENSOR) MISC USE AS DIRECTED 10/18/23   Dartha Ernst, MD  cyclobenzaprine  (FLEXERIL ) 10 MG tablet Take 10 mg by mouth at bedtime as needed for muscle spasms. 07/06/20   [provider]  diclofenac  sodium (VOLTAREN ) 1 % GEL Apply 4 g topically 4 (four) times daily. 09/06/17   Kassie Mallick, MD  docusate sodium (COLACE) 100 MG capsule Take 100 mg by mouth 2 (two) times daily.    [provider]  Dulaglutide  (TRULICITY ) 4.5 MG/0.5ML SOAJ INJECT 4.5 MG AS DIRECTED ONCE A WEEK. 12/09/23   Kennyth Worth HERO, MD  fluconazole  (DIFLUCAN ) 200 MG tablet Take 200 mg by mouth as needed.    [provider]  fluorometholone (FML) 0.1 % ophthalmic suspension Place 1 drop into both eyes as needed. 09/16/23   [provider]  fluticasone  (FLONASE ) 50 MCG/ACT nasal spray SPRAY 2 SPRAYS INTO EACH NOSTRIL EVERY DAY 10/22/23   Soldatova, Liuba, MD  gabapentin  (NEURONTIN ) 300 MG capsule Take 300 mg  by mouth 3 (three) times daily.    [provider]  gabapentin  (NEURONTIN ) 600 MG tablet Take 600 mg by mouth 2 (two) times daily. 03/22/24   [provider]  Glucagon  (BAQSIMI  ONE PACK) 3 MG/DOSE POWD Place 1 Device into the nose as needed (Low blood sugar with impaired consciousness). 10/17/23   Motwani, Komal, MD  Halobetasol & Lactic Acid (ULTRAVATE X, OINTMENT, EX) Apply 1 Application topically 2 (two) times daily.    [provider]  HYDROcodone -acetaminophen  (NORCO/VICODIN) 5-325 MG tablet Take 1 tablet by mouth as needed for moderate pain (pain score 4-6).    [provider]  insulin  glargine (LANTUS  SOLOSTAR) 100 UNIT/ML Solostar Pen Inject 18 Units into the skin daily. Take 18 units qam Patient taking differently: Inject 18 Units into the skin daily. Take 16 units qam 03/03/24   Motwani, Komal, MD  insulin  lispro (HUMALOG  KWIKPEN) 100 UNIT/ML KwikPen Inject 15 units with break fast and lunch, 16-18 with supper 15 min before meals. 04/21/24   Motwani, Komal, MD  Insulin  Pen Needle (BD PEN NEEDLE MINI ULTRAFINE) 31G X 5 MM MISC Use as directed with insulin . 04/23/24   Motwani, Komal, MD  ipratropium (ATROVENT) 0.06 % nasal spray Place 1 spray into both nostrils as needed. 09/09/23   [provider]  levalbuterol  (XOPENEX  HFA) 45 MCG/ACT inhaler Inhale 1 puff into the lungs every 4 (four) hours as needed for wheezing. 05/10/22   Walker, Caitlin S, NP  levocetirizine (XYZAL ) 5 MG tablet Take 1 tablet (5 mg total) by mouth every evening. 02/13/24   Kennyth Worth HERO, MD  levothyroxine  (SYNTHROID ) 125 MCG tablet Take 1 tablet (125 mcg total) by mouth daily. 04/15/24   Motwani, Komal, MD  lidocaine  (LIDODERM ) 5 % PLACE 1 PATCH ONTO THE SKIN DAILY. REMOVE & DISCARD PATCH WITHIN 12 HOURS OR AS DIRECTED BY MD 01/15/23   Cheryl Waddell HERO, PA-C  MAGNESIUM PO Take 3 tablets by mouth daily as needed (foot cramps).    [provider]  methocarbamol (ROBAXIN) 750 MG  tablet Take 750 mg by mouth every 6 (six) hours as needed. 08/12/23   [provider]  Multiple Vitamin (MULTIVITAMIN) tablet Take 1 tablet by mouth in the morning and at bedtime.    [provider]  nitroGLYCERIN  (NITROSTAT ) 0.4 MG SL tablet Place 1 tablet (0.4 mg total) under the tongue every 5 (five) minutes as needed for chest pain. 11/27/23   Acharya, Gayatri A, MD  pantoprazole  (PROTONIX ) 40 MG tablet Take 1 tablet (40 mg total) by mouth 2 (two) times daily. 02/24/24   Kennyth Worth HERO, MD  phenazopyridine  (PYRIDIUM ) 100 MG tablet Take 1 tablet (100 mg total) by mouth 3 (three) times daily with meals. 09/26/23   Nche, Roselie Rockford, NP  predniSONE  (DELTASONE ) 5 MG tablet Take 4 tabs po x 2 days, 3  tabs po x 2 days, 2  tabs po x 2 days, 1  tab po x 2 days 04/16/24   Cheryl Waddell HERO, PA-C  traMADol  (ULTRAM ) 50 MG tablet Take 50 mg by mouth every 6 (six) hours as needed (pain).    [provider]    Family History    Family History  Problem Relation Age of Onset   Diabetes Mother    Neuropathy Mother    Diabetes Father    Congestive Heart Failure Father    Hypertension Father    Myelodysplastic syndrome Father    Bladder Cancer Sister    Multiple sclerosis Sister    Breast cancer Sister        breast onset age 52   Osteoporosis Sister    Liver cancer Maternal Aunt  Breast cancer Maternal Aunt    Diabetes Maternal Aunt    Colon cancer Maternal Uncle    Diabetes Maternal Uncle    Neuropathy Paternal Aunt    Diabetes Maternal Grandmother    Diabetes Paternal Grandmother    She indicated that her mother is deceased. She indicated that her father is deceased. She indicated that all of her three sisters are alive. She indicated that her brother is alive. She indicated that the status of her maternal grandmother is unknown. She indicated that the status of her paternal grandmother is unknown. She indicated that the status of her maternal aunt is unknown. She  indicated that the status of her maternal uncle is unknown. She indicated that the status of her paternal aunt is unknown.  Social History    Social History   Socioeconomic History   Marital status: Single    Spouse name: Not on file   Number of children: 0   Years of education: 16   Highest education level: Bachelor's degree (e.g., BA, AB, BS)  Occupational History   Occupation: Research scientist (medical)   Occupation: Retired  Tobacco Use   Smoking status: Former    Current packs/day: 0.00    Average packs/day: 1.5 packs/day for 15.0 years (22.5 ttl pk-yrs)    Types: Cigarettes    Start date: 29    Quit date: 1992    Years since quitting: 33.6    Passive exposure: Never   Smokeless tobacco: Never  Vaping Use   Vaping status: Never Used  Substance and Sexual Activity   Alcohol use: Yes    Comment: rarely   Drug use: Never   Sexual activity: Not Currently  Other Topics Concern   Not on file  Social History Narrative   Not on file   Social Drivers of Health   Financial Resource Strain: Low Risk  (05/06/2024)   Overall Financial Resource Strain (CARDIA)    Difficulty of Paying Living Expenses: Not very hard  Food Insecurity: No Food Insecurity (05/06/2024)   Hunger Vital Sign    Worried About Running Out of Food in the Last Year: Never true    Ran Out of Food in the Last Year: Never true  Transportation Needs: No Transportation Needs (05/06/2024)   PRAPARE - Administrator, Civil Service (Medical): No    Lack of Transportation (Non-Medical): No  Physical Activity: Inactive (05/06/2024)   Exercise Vital Sign    Days of Exercise per Week: 0 days    Minutes of Exercise per Session: Not on file  Stress: No Stress Concern Present (05/06/2024)   [employer] of Occupational Health - Occupational Stress Questionnaire    Feeling of Stress: Only a little  Social Connections: Moderately Integrated (05/06/2024)   Social Connection and Isolation Panel    Frequency of  Communication with Friends and Family: More than three times a week    Frequency of Social Gatherings with Friends and Family: Three times a week    Attends Religious Services: More than 4 times per year    Active Member of Clubs or Organizations: Yes    Attends Banker Meetings: More than 4 times per year    Marital Status: Never married  Intimate Partner Violence: Not At Risk (05/19/2020)   Humiliation, Afraid, Rape, and Kick questionnaire    Fear of Current or Ex-Partner: No    Emotionally Abused: No    Physically Abused: No    Sexually Abused: No     Review  of Systems    General:  No chills, fever, night sweats or weight changes.  Cardiovascular:  No chest pain, dyspnea on exertion, edema, orthopnea, palpitations, paroxysmal nocturnal dyspnea. Dermatological: No rash, lesions/masses Respiratory: No cough, dyspnea Urologic: No hematuria, dysuria Abdominal:   No nausea, vomiting, diarrhea, bright red blood per rectum, melena, or hematemesis Neurologic:  No visual changes, wkns, changes in mental status. All other systems reviewed and are otherwise negative except as noted above.  Physical Exam    VS:  BP 132/80   Pulse 73   Ht 5' 11 (1.803 m)   Wt 214 lb 6.4 oz (97.3 kg)   SpO2 95%   BMI 29.90 kg/m  , BMI Body mass index is 29.9 kg/m. GEN: Well nourished, well developed, in no acute distress. HEENT: normal. Neck: Supple, no JVD, carotid bruits, or masses. Cardiac: RRR, no murmurs, rubs, or gallops. No clubbing, cyanosis, 1-2+ bilateral lower extremity edema.  Radials/DP/PT 2+ and equal bilaterally.  Respiratory:  Respirations regular and unlabored, clear to auscultation bilaterally. GI: Soft, nontender, nondistended, BS + x 4. MS: no deformity or atrophy. Skin: warm and dry, no rash. Neuro:  Strength and sensation are intact. Psych: Normal affect.  Accessory Clinical Findings    Recent Labs: 04/09/2024: TSH 8.25 05/07/2024: ALT 75; BUN 17; Creatinine, Ser  1.29; Potassium 4.1; Sodium 142 06/29/2024: Hemoglobin 11.0; Platelet Count 182   Recent Lipid Panel    Component Value Date/Time   CHOL 120 02/20/2024 0833   CHOL 136 04/29/2019 1336   TRIG 94 02/20/2024 0833   HDL 44 (L) 02/20/2024 0833   HDL 47 04/29/2019 1336   CHOLHDL 2.7 02/20/2024 0833   VLDL 18.8 03/01/2022 1112   LDLCALC 58 02/20/2024 0833   LDLDIRECT 99.0 10/12/2011 1219         ECG personally reviewed by me today-none today.EKG Interpretation Date/Time:  Wednesday July 01 2024 11:20:57 EDT Ventricular Rate:  73 PR Interval:  136 QRS Duration:  92 QT Interval:  400 QTC Calculation: 440 R Axis:   -11  Text Interpretation: Normal sinus rhythm Normal ECG When compared with ECG of 27-Nov-2023 16:45, No significant change was found Confirmed by Emelia Hazy [phone]) on 07/01/2024 11:23:47 AM    Echocardiogram 04/04/2021  1. Left ventricular ejection fraction, by estimation, is 60 to 65%. The  left ventricle has normal function. The left ventricle has no regional  wall motion abnormalities. Left ventricular diastolic parameters were  normal. The average left ventricular  global longitudinal strain is -19.9 %. The global longitudinal strain is  normal.   2. Right ventricular systolic function is normal. The right ventricular  size is normal. There is normal pulmonary artery systolic pressure. The  estimated right ventricular systolic pressure is 26.0 mmHg.   3. The mitral valve is grossly normal. Mild mitral valve regurgitation.  No evidence of mitral stenosis.   4. The aortic valve is tricuspid. Aortic valve regurgitation is not  visualized. No aortic stenosis is present.   5. The inferior vena cava is normal in size with greater than 50%  respiratory variability, suggesting right atrial pressure of 3 mmHg.   FINDINGS   Left Ventricle: Left ventricular ejection fraction, by estimation, is 60  to 65%. The left ventricle has normal function. The left ventricle  has no  regional wall motion abnormalities. The average left ventricular global  longitudinal strain is -19.9 %.  The global longitudinal strain is normal. The left ventricular internal  cavity size was normal in  size. There is no left ventricular hypertrophy.  Left ventricular diastolic parameters were normal.   Right Ventricle: The right ventricular size is normal. No increase in  right ventricular wall thickness. Right ventricular systolic function is  normal. There is normal pulmonary artery systolic pressure. The tricuspid  regurgitant velocity is 2.40 m/s, and   with an assumed right atrial pressure of 3 mmHg, the estimated right  ventricular systolic pressure is 26.0 mmHg.   Left Atrium: Left atrial size was normal in size.   Right Atrium: Right atrial size was normal in size.   Pericardium: Trivial pericardial effusion is present. Presence of  pericardial fat pad.   Mitral Valve: The mitral valve is grossly normal. Mild mitral valve  regurgitation. No evidence of mitral valve stenosis.   Tricuspid Valve: The tricuspid valve is grossly normal. Tricuspid valve  regurgitation is trivial. No evidence of tricuspid stenosis.   Aortic Valve: The aortic valve is tricuspid. Aortic valve regurgitation is  not visualized. No aortic stenosis is present.   Pulmonic Valve: The pulmonic valve was grossly normal. Pulmonic valve  regurgitation is not visualized. No evidence of pulmonic stenosis.   Aorta: The aortic root and ascending aorta are structurally normal, with  no evidence of dilitation.   Venous: The right upper pulmonary vein is normal. The inferior vena cava  is normal in size with greater than 50% respiratory variability,  suggesting right atrial pressure of 3 mmHg.   IAS/Shunts: The atrial septum is grossly normal.   Echo 05/14/24  IMPRESSIONS     1. Left ventricular ejection fraction, by estimation, is 60 to 65%. The  left ventricle has normal function. The left  ventricle has no regional  wall motion abnormalities. There is mild left ventricular hypertrophy.  Left ventricular diastolic parameters  were normal.   2. Right ventricular systolic function is normal. The right ventricular  size is normal.   3. MR is more posteriorly directed, wrapping around posterior wall of  left atrium. WOuld recommend TEE to further define mechanism of  regurgitation.. The mitral valve is degenerative. Severe mitral valve  regurgitation. Moderate mitral stenosis.   4. The aortic valve is tricuspid. Aortic valve regurgitation is trivial.  Aortic valve sclerosis/calcification is present, without any evidence of  aortic stenosis.   Comparison(s): EF 60%, GLS -19.9%, RVSP 26.0 mmHg, mild MR, trivial TR, no  AI seen, no PI seen.   FINDINGS   Left Ventricle: Left ventricular ejection fraction, by estimation, is 60  to 65%. The left ventricle has normal function. The left ventricle has no  regional wall motion abnormalities. 3D ejection fraction reviewed and  evaluated as part of the  interpretation. Alternate measurement of EF is felt to be most reflective  of LV function. The left ventricular internal cavity size was normal in  size. There is mild left ventricular hypertrophy. Left ventricular  diastolic parameters were normal.   Right Ventricle: The right ventricular size is normal. Right vetricular  wall thickness was not assessed. Right ventricular systolic function is  normal.   Left Atrium: Left atrial size was normal in size.   Right Atrium: Right atrial size was normal in size.   Pericardium: There is no evidence of pericardial effusion.   Mitral Valve: MR is more posteriorly directed, wrapping around posterior  wall of left atrium. WOuld recommend TEE to further define mechanism of  regurgitation. The mitral valve is degenerative in appearance. There is  mild thickening of the mitral valve  leaflet(s). Severe  mitral valve regurgitation. Moderate  mitral valve  stenosis.   Tricuspid Valve: The tricuspid valve is normal in structure. Tricuspid  valve regurgitation is mild.   Aortic Valve: The aortic valve is tricuspid. Aortic valve regurgitation is  trivial. Aortic valve sclerosis/calcification is present, without any  evidence of aortic stenosis. Aortic valve mean gradient measures 4.0 mmHg.  Aortic valve peak gradient  measures 8.5 mmHg. Aortic valve area, by VTI measures 2.32 cm.   Pulmonic Valve: The pulmonic valve was normal in structure. Pulmonic valve  regurgitation is mild.   Aorta: The aortic root and ascending aorta are structurally normal, with  no evidence of dilitation.   IAS/Shunts: No atrial level shunt detected by color flow Doppler.    Assessment & Plan   1.  Shortness of breath-reports intermittent periods of [place].  Her breathing is stable at this time however she has not been very physically active due to lower extremity pain.  Her prior CBC and iron  drawn 05/07/2024 which showed hemoglobin of 11.1 and iron  of 41.  She denies bleeding issues. Has plans to undergo TEE with Dr. Loni 07/06/2024. Continue iron  rich diet Continue weight loss-weight today 214 pounds Increase physical activity as tolerated-goal 150 minutes of moderate physical activity per week.  Chronic diastolic CHF-weight today 214 pounds.  Bilateral lower extremity 1-2+ pitting edema.  Proceed to TEE Heart healthy low-sodium diet Increase physical activity as tolerated Daily weights Elevate lower extremities when not active Lower extremity support stockings-continue  Coronary artery disease-none today.  Denies chest pain with exertion.  She is status post PCI with stenting in 2023 Heart healthy low-sodium diet Continue atorvastatin , Plavix , nitroglycerin  as needed  Hyperlipidemia-LDL 58 on 02/20/24. High-fiber diet Continue Plavix , atorvastatin    Disposition: Follow-up with Dr.  Acharya as scheduled   Josefa HERO. Vivi Piccirilli NP-C     07/01/2024, 11:50 AM [facility] [address] Office [phone] Fax [phone]    I spent 14 minutes examining this patient, reviewing medications, and using patient centered shared decision making involving their cardiac care.   I spent  20 minutes reviewing past medical history,  medications, and prior cardiac tests.

## 2024-06-29 NOTE — Telephone Encounter (Signed)
 MEDICATION: insulin  lispro (HUMALOG ) 100 UNIT/ML KwikPen   PHARMACY:    [facility] [address], [place] - [address] (Ph: [phone])    HAS THE PATIENT CONTACTED THEIR PHARMACY?  Yes  LAST REFILL:  @@LASTREFILL @  IS THIS A 90 DAY SUPPLY : Yes  IS PATIENT OUT OF MEDICATION: Yes  IF NOT; HOW MUCH IS LEFT:   LAST APPOINTMENT DATE: @8 /09/2024  NEXT APPOINTMENT DATE:@11 /10/2024  DO WE HAVE YOUR PERMISSION TO LEAVE A DETAILED MESSAGE?:Yes  OTHER COMMENTS:    **Let patient know to contact pharmacy at the end of the day to make sure medication is ready. **  ** Please notify patient to allow 48-72 hours to process**  **Encourage patient to contact the pharmacy for refills or they can request refills through [facility]**

## 2024-06-29 NOTE — H&P (View-Only) (Signed)
 Cardiology Clinic Note   Patient Name: Savannah [name] Date of Encounter: 07/01/2024  Primary Care Provider:  Kennyth Worth HERO, MD Primary Cardiologist:  Soyla DELENA Merck, MD  Patient Profile    Savannah [name] presents to the clinic today for evaluation of her shortness of breath.  Past Medical History    Past Medical History:  Diagnosis Date   ABDOMINAL PAIN, CHRONIC 10/20/2009   Acute cystitis 08/17/2009   ALLERGIC RHINITIS 11/21/2007   Allergy     ASYMPTOMATIC POSTMENOPAUSAL STATUS 09/29/2008   Bariatric surgery status 07/07/2010   Bariatric surgery status 07/07/2010   Qualifier: Diagnosis of  By: Kassie MD, Sean A    [facility])    uterine   Cataract    Coronary artery disease    DEPRESSION 11/21/2007   DIABETES MELLITUS, TYPE II 07/08/2007   DYSPNEA 05/20/2009   Edema 05/20/2009   Eosinophilic esophagitis 02/13/2008   FEVER UNSPECIFIED 10/20/2009   FEVER, HX OF 03/09/2010   GERD (gastroesophageal reflux disease)    Headache(784.0) 02/06/2010   Hepatomegaly 01/06/2008   HYPERLIPIDEMIA 07/08/2007   HYPERTENSION 07/08/2007   Kidney disease    followed by nephrology, per patient   LEUKOPENIA, MILD 09/29/2008   Lymphedema    left leg, dx approximately in 03/2023 per patient   Melanoma in situ of left upper arm (HCC) 04/03/2023   OBESITY 01/06/2008   OSTEOARTHRITIS 01/06/2008   Other chronic nonalcoholic liver disease 01/06/2008   PERIPHERAL NEUROPATHY 11/21/2007   Peripheral neuropathy    Postsurgical hypothyroidism 09/19/2010   SINUSITIS- ACUTE-NOS 11/21/2007   TB SKIN TEST, POSITIVE 02/06/2010   THYROID  NODULE 03/09/2010   Past Surgical History:  Procedure Laterality Date   ABDOMINAL HYSTERECTOMY     BASAL CELL CARCINOMA EXCISION     CARDIAC CATHETERIZATION     CHOLECYSTECTOMY N/A 05/11/2021   Procedure: LAPAROSCOPIC CHOLECYSTECTOMY WITH POSSIBLE  INTRAOPERATIVE CHOLANGIOGRAM;  Surgeon: Gladis Cough, MD;  Location: [facility];  Service: General;   Laterality: N/A;   COLONOSCOPY     CORONARY ATHERECTOMY N/A 05/03/2022   Procedure: CORONARY ATHERECTOMY;  Surgeon: Wendel Lurena POUR, MD;  Location: [facility];  Service: Cardiovascular;  Laterality: N/A;   CORONARY IMAGING/OCT N/A 05/03/2022   Procedure: INTRAVASCULAR IMAGING/OCT;  Surgeon: Wendel Lurena POUR, MD;  Location: [facility];  Service: Cardiovascular;  Laterality: N/A;   CORONARY PRESSURE/FFR STUDY N/A 05/03/2022   Procedure: INTRAVASCULAR PRESSURE WIRE/FFR STUDY;  Surgeon: Wendel Lurena POUR, MD;  Location: [facility];  Service: Cardiovascular;  Laterality: N/A;   CORONARY STENT INTERVENTION N/A 05/03/2022   Procedure: CORONARY STENT INTERVENTION;  Surgeon: Wendel Lurena POUR, MD;  Location: [facility];  Service: Cardiovascular;  Laterality: N/A;  lad   EYE SURGERY Bilateral 08/2018, 09/2018   FOOT SURGERY Left    10/2019   KNEE ARTHROSCOPY Left    LEFT HEART CATH AND CORONARY ANGIOGRAPHY N/A 01/20/2019   Procedure: LEFT HEART CATH AND CORONARY ANGIOGRAPHY;  Surgeon: Claudene Victory ORN, MD;  Location: [facility];  Service: Cardiovascular;  Laterality: N/A;   LYMPH NODE DISSECTION     OOPHORECTOMY     Right total knee replacement     RIGHT/LEFT HEART CATH AND CORONARY ANGIOGRAPHY N/A 05/03/2022   Procedure: RIGHT/LEFT HEART CATH AND CORONARY ANGIOGRAPHY;  Surgeon: Wendel Lurena POUR, MD;  Location: [facility];  Service: Cardiovascular;  Laterality: N/A;   ROUX-EN-Y GASTRIC BYPASS  2011   SKIN TAG REMOVAL  THYROIDECTOMY     TONSILLECTOMY AND ADENOIDECTOMY     TOOTH EXTRACTION  2023   UPPER GASTROINTESTINAL ENDOSCOPY     UPPER GI ENDOSCOPY  2024    Allergies  Allergies  Allergen Reactions   Ace Inhibitors Cough   Caffeine     PVCs   Ciprofloxacin Itching   Morphine  And Codeine Itching    Pt tolerates medication if given with diphenhydramine    Mucinex [Guaifenesin Er]     PVCs   Nsaids     Gastric Bypass Surgery - unable to  take, tolerates ec 81 aspirin     Other     Antihistamine-alkylamine - PVCs tolerates benadryl     Silicone Hives   Tape Hives    History of Present Illness    Savannah [name] has a PMH of coronary artery disease status post stent placement in 2023, hyperlipidemia, diastolic CHF, and HTN.  Patient reported that she had mild regurgitation of her mitral valve.  This was evident in her echocardiogram in 2022.  She was seen in follow-up by Dr. Loni on 11/27/2023.  During that time she noted occasional chest pain which she described as pressure.  It was more pronounced with laying on her back.   it was felt that her pain may be esophageal or MSK in nature.  She reported that she had been experiencing urinary tract infections which were difficult to manage.  She had completed a course of Augmentin .  She did not feel that the infection was fully resolved.  She did note occasional bruising which she attributed to Plavix .  She had been off of her furosemide  for her lymphedema.  Her diabetes was managed by endocrinology.  She was seen by her PCP 05/07/2024.  She reported daily headaches for few months.  She noted that she had head trauma a few months ago.  It was felt that she may have postconcussive syndrome.  Brain MRI as well as CBC, CMP, sedimentation rate and CRP were ordered.  It was felt that she may need a referral to neurology.  Her CBC showed normal WBC RBCs of 3.56 and hemoglobin of 11.1 with hematocrit 34.1.  Her iron  was noted to be slightly low at 41.  Ferritin was also decreased at 7.7.  TIBC was 492.8.  She contacted the cardiology clinic 05/08/24.  She reported shortness of breath.  She was added to my schedule.  She stated over the past few weeks she had noticed more shortness of breath.  She noticed this with mild increased physical activity such as walking.  She had recent head trauma and we reviewed her visit with her PCP the prior day.  She was noted to have anemia at that time.  She also  reported that she had gained around 50 pounds.  She noted that she was not very physically active.  She was concerned at that time with her increased shortness of breath due to her cardiac history.  We reviewed her previous visit and previous stenting.  She expressed understanding. I rescheduled her echocardiogram to 7 /3 and planned her follow-up after echocardiogram.  She did have lower extremity insufficiency and had been following with vein specialist who recommended lower extremity support stockings.  I  gave her the elastic support stockings sheet because she had been having issues with her recently ordered support stockings.  She had not yet scheduled her head MRI.  I planned follow-up after her echocardiogram.  She had plans to go on a trip out west  over the summer.  She presents to the clinic today for follow-up evaluation and states she noticed increased work of breathing at elevation while she was on vacation in the [facility].  She was visiting family and [place]  and then they traveled [place].  She continues to work with the lymphedema clinic and is expecting to receive lymphedema pumps in the next month.  She does have lower extremity 1-2+ pitting edema to the midshin left greater than right which remains stable.  She also received lower extremity support stockings which she is in the process of reordering due to them not being long enough.  We reviewed her echocardiogram.  She expressed understanding.  We reviewed possible mitral valve intervention.  I will have her proceed with TEE with Dr. Loni and keep follow-up as scheduled.  Today she denies chest pain, and increased work of breathing.   Home Medications    Prior to Admission medications   Medication Sig Start Date End Date Taking? Authorizing Provider  acetaminophen  (TYLENOL ) 500 MG tablet Take 2 tablets (1,000 mg total) by mouth every 6 (six) hours as needed for mild pain or fever. 05/13/21   Rai, Nydia POUR, MD  atorvastatin   (LIPITOR) 40 MG tablet Take 1 tablet (40 mg total) by mouth daily. 08/30/23   Acharya, Gayatri A, MD  blood glucose meter kit and supplies KIT Use up to three  times daily as directed. 08/27/23   Kennyth Worth HERO, MD  Blood Glucose Monitoring Suppl DEVI 1 each by Does not apply route in the morning, at noon, and at bedtime. May substitute to any manufacturer covered by patient's insurance. 10/29/23   Motwani, Obadiah, MD  cetirizine  (ZYRTEC ) 10 MG tablet TAKE 1 TABLET BY MOUTH EVERY DAY 10/22/23   Soldatova, Liuba, MD  Cholecalciferol (VITAMIN D ) 50 MCG (2000 UT) tablet Take 4,000 Units by mouth in the morning.    [provider]  clopidogrel  (PLAVIX ) 75 MG tablet Take 1 tablet (75 mg total) by mouth daily. 02/17/24   Acharya, Gayatri A, MD  clotrimazole  (MYCELEX ) 10 MG troche Take 1 tablet (10 mg total) by mouth 5 (five) times daily. 09/26/23   Nche, Roselie Rockford, NP  Colchicine  (MITIGARE ) 0.6 MG CAPS Take by mouth as needed.    [provider]  Continuous Glucose Sensor (DEXCOM G7 SENSOR) MISC USE AS DIRECTED 10/18/23   Dartha Obadiah, MD  cyclobenzaprine  (FLEXERIL ) 10 MG tablet Take 10 mg by mouth at bedtime as needed for muscle spasms. 07/06/20   [provider]  diclofenac  sodium (VOLTAREN ) 1 % GEL Apply 4 g topically 4 (four) times daily. 09/06/17   Kassie Mallick, MD  docusate sodium (COLACE) 100 MG capsule Take 100 mg by mouth 2 (two) times daily.    [provider]  Dulaglutide  (TRULICITY ) 4.5 MG/0.5ML SOAJ INJECT 4.5 MG AS DIRECTED ONCE A WEEK. 12/09/23   Kennyth Worth HERO, MD  fluconazole  (DIFLUCAN ) 200 MG tablet Take 200 mg by mouth as needed.    [provider]  fluorometholone (FML) 0.1 % ophthalmic suspension Place 1 drop into both eyes as needed. 09/16/23   [provider]  fluticasone  (FLONASE ) 50 MCG/ACT nasal spray SPRAY 2 SPRAYS INTO EACH NOSTRIL EVERY DAY 10/22/23   Soldatova, Liuba, MD  gabapentin  (NEURONTIN ) 300 MG capsule Take 300 mg  by mouth 3 (three) times daily.    [provider]  gabapentin  (NEURONTIN ) 600 MG tablet Take 600 mg by mouth 2 (two) times daily. 03/22/24   [provider]  Glucagon  (BAQSIMI  ONE PACK) 3 MG/DOSE POWD Place 1 Device into the nose as needed (Low blood sugar with impaired consciousness). 10/17/23   Motwani, Komal, MD  Halobetasol & Lactic Acid (ULTRAVATE X, OINTMENT, EX) Apply 1 Application topically 2 (two) times daily.    [provider]  HYDROcodone -acetaminophen  (NORCO/VICODIN) 5-325 MG tablet Take 1 tablet by mouth as needed for moderate pain (pain score 4-6).    [provider]  insulin  glargine (LANTUS  SOLOSTAR) 100 UNIT/ML Solostar Pen Inject 18 Units into the skin daily. Take 18 units qam Patient taking differently: Inject 18 Units into the skin daily. Take 16 units qam 03/03/24   Motwani, Komal, MD  insulin  lispro (HUMALOG  KWIKPEN) 100 UNIT/ML KwikPen Inject 15 units with break fast and lunch, 16-18 with supper 15 min before meals. 04/21/24   Motwani, Komal, MD  Insulin  Pen Needle (BD PEN NEEDLE MINI ULTRAFINE) 31G X 5 MM MISC Use as directed with insulin . 04/23/24   Motwani, Komal, MD  ipratropium (ATROVENT) 0.06 % nasal spray Place 1 spray into both nostrils as needed. 09/09/23   [provider]  levalbuterol  (XOPENEX  HFA) 45 MCG/ACT inhaler Inhale 1 puff into the lungs every 4 (four) hours as needed for wheezing. 05/10/22   Walker, Caitlin S, NP  levocetirizine (XYZAL ) 5 MG tablet Take 1 tablet (5 mg total) by mouth every evening. 02/13/24   Kennyth Worth HERO, MD  levothyroxine  (SYNTHROID ) 125 MCG tablet Take 1 tablet (125 mcg total) by mouth daily. 04/15/24   Motwani, Komal, MD  lidocaine  (LIDODERM ) 5 % PLACE 1 PATCH ONTO THE SKIN DAILY. REMOVE & DISCARD PATCH WITHIN 12 HOURS OR AS DIRECTED BY MD 01/15/23   Cheryl Waddell HERO, PA-C  MAGNESIUM PO Take 3 tablets by mouth daily as needed (foot cramps).    [provider]  methocarbamol (ROBAXIN) 750 MG  tablet Take 750 mg by mouth every 6 (six) hours as needed. 08/12/23   [provider]  Multiple Vitamin (MULTIVITAMIN) tablet Take 1 tablet by mouth in the morning and at bedtime.    [provider]  nitroGLYCERIN  (NITROSTAT ) 0.4 MG SL tablet Place 1 tablet (0.4 mg total) under the tongue every 5 (five) minutes as needed for chest pain. 11/27/23   Acharya, Gayatri A, MD  pantoprazole  (PROTONIX ) 40 MG tablet Take 1 tablet (40 mg total) by mouth 2 (two) times daily. 02/24/24   Kennyth Worth HERO, MD  phenazopyridine  (PYRIDIUM ) 100 MG tablet Take 1 tablet (100 mg total) by mouth 3 (three) times daily with meals. 09/26/23   Nche, Roselie Rockford, NP  predniSONE  (DELTASONE ) 5 MG tablet Take 4 tabs po x 2 days, 3  tabs po x 2 days, 2  tabs po x 2 days, 1  tab po x 2 days 04/16/24   Cheryl Waddell HERO, PA-C  traMADol  (ULTRAM ) 50 MG tablet Take 50 mg by mouth every 6 (six) hours as needed (pain).    [provider]    Family History    Family History  Problem Relation Age of Onset   Diabetes Mother    Neuropathy Mother    Diabetes Father    Congestive Heart Failure Father    Hypertension Father    Myelodysplastic syndrome Father    Bladder Cancer Sister    Multiple sclerosis Sister    Breast cancer Sister        breast onset age 45   Osteoporosis Sister    Liver cancer Maternal Aunt  Breast cancer Maternal Aunt    Diabetes Maternal Aunt    Colon cancer Maternal Uncle    Diabetes Maternal Uncle    Neuropathy Paternal Aunt    Diabetes Maternal Grandmother    Diabetes Paternal Grandmother    She indicated that her mother is deceased. She indicated that her father is deceased. She indicated that all of her three sisters are alive. She indicated that her brother is alive. She indicated that the status of her maternal grandmother is unknown. She indicated that the status of her paternal grandmother is unknown. She indicated that the status of her maternal aunt is unknown. She  indicated that the status of her maternal uncle is unknown. She indicated that the status of her paternal aunt is unknown.  Social History    Social History   Socioeconomic History   Marital status: Single    Spouse name: Not on file   Number of children: 0   Years of education: 16   Highest education level: Bachelor's degree (e.g., BA, AB, BS)  Occupational History   Occupation: Research scientist (medical)   Occupation: Retired  Tobacco Use   Smoking status: Former    Current packs/day: 0.00    Average packs/day: 1.5 packs/day for 15.0 years (22.5 ttl pk-yrs)    Types: Cigarettes    Start date: 24    Quit date: 1992    Years since quitting: 33.6    Passive exposure: Never   Smokeless tobacco: Never  Vaping Use   Vaping status: Never Used  Substance and Sexual Activity   Alcohol use: Yes    Comment: rarely   Drug use: Never   Sexual activity: Not Currently  Other Topics Concern   Not on file  Social History Narrative   Not on file   Social Drivers of Health   Financial Resource Strain: Low Risk  (05/06/2024)   Overall Financial Resource Strain (CARDIA)    Difficulty of Paying Living Expenses: Not very hard  Food Insecurity: No Food Insecurity (05/06/2024)   Hunger Vital Sign    Worried About Running Out of Food in the Last Year: Never true    Ran Out of Food in the Last Year: Never true  Transportation Needs: No Transportation Needs (05/06/2024)   PRAPARE - Administrator, Civil Service (Medical): No    Lack of Transportation (Non-Medical): No  Physical Activity: Inactive (05/06/2024)   Exercise Vital Sign    Days of Exercise per Week: 0 days    Minutes of Exercise per Session: Not on file  Stress: No Stress Concern Present (05/06/2024)   [employer] of Occupational Health - Occupational Stress Questionnaire    Feeling of Stress: Only a little  Social Connections: Moderately Integrated (05/06/2024)   Social Connection and Isolation Panel    Frequency of  Communication with Friends and Family: More than three times a week    Frequency of Social Gatherings with Friends and Family: Three times a week    Attends Religious Services: More than 4 times per year    Active Member of Clubs or Organizations: Yes    Attends Banker Meetings: More than 4 times per year    Marital Status: Never married  Intimate Partner Violence: Not At Risk (05/19/2020)   Humiliation, Afraid, Rape, and Kick questionnaire    Fear of Current or Ex-Partner: No    Emotionally Abused: No    Physically Abused: No    Sexually Abused: No     Review  of Systems    General:  No chills, fever, night sweats or weight changes.  Cardiovascular:  No chest pain, dyspnea on exertion, edema, orthopnea, palpitations, paroxysmal nocturnal dyspnea. Dermatological: No rash, lesions/masses Respiratory: No cough, dyspnea Urologic: No hematuria, dysuria Abdominal:   No nausea, vomiting, diarrhea, [name] red blood per rectum, melena, or hematemesis Neurologic:  No visual changes, wkns, changes in mental status. All other systems reviewed and are otherwise negative except as noted above.  Physical Exam    VS:  BP 132/80   Pulse 73   Ht 5' 11 (1.803 m)   Wt 214 lb 6.4 oz (97.3 kg)   SpO2 95%   BMI 29.90 kg/m  , BMI Body mass index is 29.9 kg/m. GEN: Well nourished, well developed, in no acute distress. HEENT: normal. Neck: Supple, no JVD, carotid bruits, or masses. Cardiac: RRR, no murmurs, rubs, or gallops. No clubbing, cyanosis, 1-2+ bilateral lower extremity edema.  Radials/DP/PT 2+ and equal bilaterally.  Respiratory:  Respirations regular and unlabored, clear to auscultation bilaterally. GI: Soft, nontender, nondistended, BS + x 4. MS: no deformity or atrophy. Skin: warm and dry, no rash. Neuro:  Strength and sensation are intact. Psych: Normal affect.  Accessory Clinical Findings    Recent Labs: 04/09/2024: TSH 8.25 05/07/2024: ALT 75; BUN 17; Creatinine, Ser  1.29; Potassium 4.1; Sodium 142 06/29/2024: Hemoglobin 11.0; Platelet Count 182   Recent Lipid Panel    Component Value Date/Time   CHOL 120 02/20/2024 0833   CHOL 136 04/29/2019 1336   TRIG 94 02/20/2024 0833   HDL 44 (L) 02/20/2024 0833   HDL 47 04/29/2019 1336   CHOLHDL 2.7 02/20/2024 0833   VLDL 18.8 03/01/2022 1112   LDLCALC 58 02/20/2024 0833   LDLDIRECT 99.0 10/12/2011 1219         ECG personally reviewed by me today-none today.EKG Interpretation Date/Time:  Wednesday July 01 2024 11:20:57 EDT Ventricular Rate:  73 PR Interval:  136 QRS Duration:  92 QT Interval:  400 QTC Calculation: 440 R Axis:   -11  Text Interpretation: Normal sinus rhythm Normal ECG When compared with ECG of 27-Nov-2023 16:45, No significant change was found Confirmed by Emelia Hazy [phone]) on 07/01/2024 11:23:47 AM    Echocardiogram 04/04/2021  1. Left ventricular ejection fraction, by estimation, is 60 to 65%. The  left ventricle has normal function. The left ventricle has no regional  wall motion abnormalities. Left ventricular diastolic parameters were  normal. The average left ventricular  global longitudinal strain is -19.9 %. The global longitudinal strain is  normal.   2. Right ventricular systolic function is normal. The right ventricular  size is normal. There is normal pulmonary artery systolic pressure. The  estimated right ventricular systolic pressure is 26.0 mmHg.   3. The mitral valve is grossly normal. Mild mitral valve regurgitation.  No evidence of mitral stenosis.   4. The aortic valve is tricuspid. Aortic valve regurgitation is not  visualized. No aortic stenosis is present.   5. The inferior vena cava is normal in size with greater than 50%  respiratory variability, suggesting right atrial pressure of 3 mmHg.   FINDINGS   Left Ventricle: Left ventricular ejection fraction, by estimation, is 60  to 65%. The left ventricle has normal function. The left ventricle  has no  regional wall motion abnormalities. The average left ventricular global  longitudinal strain is -19.9 %.  The global longitudinal strain is normal. The left ventricular internal  cavity size was normal in  size. There is no left ventricular hypertrophy.  Left ventricular diastolic parameters were normal.   Right Ventricle: The right ventricular size is normal. No increase in  right ventricular wall thickness. Right ventricular systolic function is  normal. There is normal pulmonary artery systolic pressure. The tricuspid  regurgitant velocity is 2.40 m/s, and   with an assumed right atrial pressure of 3 mmHg, the estimated right  ventricular systolic pressure is 26.0 mmHg.   Left Atrium: Left atrial size was normal in size.   Right Atrium: Right atrial size was normal in size.   Pericardium: Trivial pericardial effusion is present. Presence of  pericardial fat pad.   Mitral Valve: The mitral valve is grossly normal. Mild mitral valve  regurgitation. No evidence of mitral valve stenosis.   Tricuspid Valve: The tricuspid valve is grossly normal. Tricuspid valve  regurgitation is trivial. No evidence of tricuspid stenosis.   Aortic Valve: The aortic valve is tricuspid. Aortic valve regurgitation is  not visualized. No aortic stenosis is present.   Pulmonic Valve: The pulmonic valve was grossly normal. Pulmonic valve  regurgitation is not visualized. No evidence of pulmonic stenosis.   Aorta: The aortic root and ascending aorta are structurally normal, with  no evidence of dilitation.   Venous: The right upper pulmonary vein is normal. The inferior vena cava  is normal in size with greater than 50% respiratory variability,  suggesting right atrial pressure of 3 mmHg.   IAS/Shunts: The atrial septum is grossly normal.   Echo 05/14/24  IMPRESSIONS     1. Left ventricular ejection fraction, by estimation, is 60 to 65%. The  left ventricle has normal function. The left  ventricle has no regional  wall motion abnormalities. There is mild left ventricular hypertrophy.  Left ventricular diastolic parameters  were normal.   2. Right ventricular systolic function is normal. The right ventricular  size is normal.   3. MR is more posteriorly directed, wrapping around posterior wall of  left atrium. WOuld recommend TEE to further define mechanism of  regurgitation.. The mitral valve is degenerative. Severe mitral valve  regurgitation. Moderate mitral stenosis.   4. The aortic valve is tricuspid. Aortic valve regurgitation is trivial.  Aortic valve sclerosis/calcification is present, without any evidence of  aortic stenosis.   Comparison(s): EF 60%, GLS -19.9%, RVSP 26.0 mmHg, mild MR, trivial TR, no  AI seen, no PI seen.   FINDINGS   Left Ventricle: Left ventricular ejection fraction, by estimation, is 60  to 65%. The left ventricle has normal function. The left ventricle has no  regional wall motion abnormalities. 3D ejection fraction reviewed and  evaluated as part of the  interpretation. Alternate measurement of EF is felt to be most reflective  of LV function. The left ventricular internal cavity size was normal in  size. There is mild left ventricular hypertrophy. Left ventricular  diastolic parameters were normal.   Right Ventricle: The right ventricular size is normal. Right vetricular  wall thickness was not assessed. Right ventricular systolic function is  normal.   Left Atrium: Left atrial size was normal in size.   Right Atrium: Right atrial size was normal in size.   Pericardium: There is no evidence of pericardial effusion.   Mitral Valve: MR is more posteriorly directed, wrapping around posterior  wall of left atrium. WOuld recommend TEE to further define mechanism of  regurgitation. The mitral valve is degenerative in appearance. There is  mild thickening of the mitral valve  leaflet(s). Severe  mitral valve regurgitation. Moderate  mitral valve  stenosis.   Tricuspid Valve: The tricuspid valve is normal in structure. Tricuspid  valve regurgitation is mild.   Aortic Valve: The aortic valve is tricuspid. Aortic valve regurgitation is  trivial. Aortic valve sclerosis/calcification is present, without any  evidence of aortic stenosis. Aortic valve mean gradient measures 4.0 mmHg.  Aortic valve peak gradient  measures 8.5 mmHg. Aortic valve area, by VTI measures 2.32 cm.   Pulmonic Valve: The pulmonic valve was normal in structure. Pulmonic valve  regurgitation is mild.   Aorta: The aortic root and ascending aorta are structurally normal, with  no evidence of dilitation.   IAS/Shunts: No atrial level shunt detected by color flow Doppler.    Assessment & Plan   1.  Shortness of breath-reports intermittent periods of [place].  Her breathing is stable at this time however she has not been very physically active due to lower extremity pain.  Her prior CBC and iron  drawn 05/07/2024 which showed hemoglobin of 11.1 and iron  of 41.  She denies bleeding issues. Has plans to undergo TEE with Dr. Loni 07/06/2024. Continue iron  rich diet Continue weight loss-weight today 214 pounds Increase physical activity as tolerated-goal 150 minutes of moderate physical activity per week.  Chronic diastolic CHF-weight today 214 pounds.  Bilateral lower extremity 1-2+ pitting edema.  Proceed to TEE Heart healthy low-sodium diet Increase physical activity as tolerated Daily weights Elevate lower extremities when not active Lower extremity support stockings-continue  Coronary artery disease-none today.  Denies chest pain with exertion.  She is status post PCI with stenting in 2023 Heart healthy low-sodium diet Continue atorvastatin , Plavix , nitroglycerin  as needed  Hyperlipidemia-LDL 58 on 02/20/24. High-fiber diet Continue Plavix , atorvastatin    Disposition: Follow-up with Dr.  Acharya as scheduled   Josefa HERO. Valentin Benney NP-C     07/01/2024, 11:50 AM [facility] [address] Office [phone] Fax [phone]    I spent 14 minutes examining this patient, reviewing medications, and using patient centered shared decision making involving their cardiac care.   I spent  20 minutes reviewing past medical history,  medications, and prior cardiac tests.

## 2024-06-29 NOTE — Telephone Encounter (Signed)
 Requested Prescriptions   Pending Prescriptions Disp Refills   insulin  lispro (HUMALOG ) 100 UNIT/ML KwikPen 15 mL 0    Sig: INJECT 5 UNITS SUBCUTANEOUSLY THREE TIMES DAILY

## 2024-06-30 ENCOUNTER — Other Ambulatory Visit: Payer: Self-pay | Admitting: *Deleted

## 2024-06-30 DIAGNOSIS — I340 Nonrheumatic mitral (valve) insufficiency: Secondary | ICD-10-CM

## 2024-07-01 ENCOUNTER — Other Ambulatory Visit: Payer: Self-pay

## 2024-07-01 ENCOUNTER — Encounter: Payer: Self-pay | Admitting: General Practice

## 2024-07-01 ENCOUNTER — Other Ambulatory Visit: Payer: Self-pay | Admitting: Physician Assistant

## 2024-07-01 ENCOUNTER — Telehealth: Payer: Self-pay | Admitting: Pharmacy Technician

## 2024-07-01 ENCOUNTER — Ambulatory Visit: Attending: Cardiovascular Disease | Admitting: General Practice

## 2024-07-01 VITALS — BP 132/80 | HR 73 | Ht 71.0 in | Wt 214.4 lb

## 2024-07-01 DIAGNOSIS — R0600 Dyspnea, unspecified: Secondary | ICD-10-CM | POA: Diagnosis not present

## 2024-07-01 DIAGNOSIS — E785 Hyperlipidemia, unspecified: Secondary | ICD-10-CM | POA: Diagnosis not present

## 2024-07-01 DIAGNOSIS — I2510 Atherosclerotic heart disease of native coronary artery without angina pectoris: Secondary | ICD-10-CM | POA: Diagnosis not present

## 2024-07-01 DIAGNOSIS — I5032 Chronic diastolic (congestive) heart failure: Secondary | ICD-10-CM | POA: Diagnosis not present

## 2024-07-01 DIAGNOSIS — E1165 Type 2 diabetes mellitus with hyperglycemia: Secondary | ICD-10-CM

## 2024-07-01 MED ORDER — INSULIN LISPRO (1 UNIT DIAL) 100 UNIT/ML (KWIKPEN)
PEN_INJECTOR | SUBCUTANEOUS | 0 refills | Status: DC
Start: 2024-07-01 — End: 2024-07-17

## 2024-07-01 NOTE — Telephone Encounter (Addendum)
 Savannah [name],  Venofer  will not be covered with her insurance. Preferred medication is Feraheme. Would you like to use Feraheme??  Auth Submission: NO AUTH NEEDED Site of care: Site of care: [facility] Payer: medicare a/b @ [employer] [facility] Medication & CPT/J Code(s) submitted: Feraheme (ferumoxytol) [id number] Diagnosis Code:  Route of submission (phone, fax, portal):  Phone # Fax # Auth type: Buy/Bill PB Units/visits requested: x1 dose Reference number:  Approval from: 07/01/24 to 10/01/24

## 2024-07-01 NOTE — Patient Instructions (Signed)
 Medication Instructions:  Your physician recommends that you continue on your current medications as directed. Please refer to the Current Medication list given to you today.  *If you need a refill on your cardiac medications before your next appointment, please call your pharmacy*  Lab Work: NONE If you have labs (blood work) drawn today and your tests are completely normal, you will receive your results only by: MyChart Message (if you have MyChart) OR A paper copy in the mail If you have any lab test that is abnormal or we need to change your treatment, we will call you to review the results.  Testing/Procedures: NONE  Follow-Up: At [facility], you and your health needs are our priority.  As part of our continuing mission to provide you with exceptional heart care, our providers are all part of one team.  This team includes your primary Cardiologist (physician) and Advanced Practice Providers or APPs (Physician Assistants and Nurse Practitioners) who all work together to provide you with the care you need, when you need it.  Your next appointment:   KEEP SCHEDULED FOLLOW-UP  We recommend signing up for the patient portal called "MyChart".  Sign up information is provided on this After Visit Summary.  MyChart is used to connect with patients for Virtual Visits (Telemedicine).  Patients are able to view lab/test results, encounter notes, upcoming appointments, etc.  Non-urgent messages can be sent to your provider as well.   To learn more about what you can do with MyChart, go to [email or web address].

## 2024-07-01 NOTE — Progress Notes (Unsigned)
 Savannah Console, PA-C [address] [place], [place]  [address] Phone: [phone]   Primary Care Physician: Kennyth Worth HERO, MD  Primary Gastroenterologist:  Savannah Console, PA-C / Elspeth Naval, MD   Chief Complaint:  Abdominal Pain, Worsening Diarrhea, IDA   HPI:   Savannah [name] is a 68 y.o. female presents for evaluation of Worsening diarrhea.  Patient states she has had diarrhea for 1.5 years.  Diarrhea is worsening.  She has at least 3 or 4 loose and watery malodorous stools every morning.  Denies melena or hematochezia.  She has associated abdominal pain in the LUQ, LLQ and RLQ.  She has been on multiple antibiotics for UTIs in the past 6 months.  Also underwent cholecystectomy in 2022.  She reports having change in bowel habits since 2011 when she had gastric bypass.  She has had worsening diarrhea for 1.5 years.  Prior to that she was constipated.  She has iron  deficiency anemia and pica.  She is scheduled for IV iron  infusion next week.  06/29/2024 labs showed Hgb 11.0, MCV 95.6, ferritin 15, iron  55, iron  saturation 11%.  Normal B12 and folate.  PMH:  Cholecystectomy 04/2021, Roux-en-Y gastric bypass 2011, colon polyps, dysphagia, cricophayrngeal bar, cervical osteophytes, GERD, Esophageal stricture (dilated 03/2023), CAD, DM2, Neuropathy, Uterine cancer (1997).  Currently on Plavix .  Echo 05/2024 LVEF 60-65%.   Takes Pantoprazole  40mg  BID.  06/29/24 Labs: Normal B12, Folate and TSH.  Hgb 11.0, MCV 95.6.  Ferritin 15. Normal Iron . 04/2024 labs: IDA: Hgb11.1, Iron  41, Iron  sat 8.3%, ferritin 7.7.  03/2023 EGD by Dr. Naval: 2 cm Hiatal Hernia, Benign distal esophageal stenosis dilated to 20mm.  Otherwise normal esophagus.  Roux-en-Y gastrojejunostomy with gastrojejunal anastomosis characterized by healthy appearing mucosa. -A few gastric polyps. Normal examined small bowel limb.  Biopsies Negative for H. Pylori, EOE, and dysplasia.  03/2017 Colonoscopy: One small 5mm tubular  adenoma polyp removed.  Internal hemorrhoids.  7 year repeat (due 03/2024).  06/2023: Abd Xray:  Large amount of stool within the distal transverse and descending colon.  04/2021 Abd MRI / MRCP:  1. Gallbladder sludge without evidence acute cholecystitis. 2. No choledocholithiasis or biliary obstruction. 3. Normal liver parenchyma.  Normal pancreas  04/2021 RUQ US : Cholelithiasis. Gallbladder distension. Sludge present as well as mild wall thickening and trace pericholecystic fluid. Recommend clinical correlation to exclude acute cholecystitis. Mildly dilated common bile duct, 10 mm. Distal duct cannot be visualized due to overlying bowel gas.  Current Outpatient Medications  Medication Sig Dispense Refill   acetaminophen  (TYLENOL ) 500 MG tablet Take 2 tablets (1,000 mg total) by mouth every 6 (six) hours as needed for mild pain or fever. 30 tablet 0   atorvastatin  (LIPITOR) 40 MG tablet Take 1 tablet (40 mg total) by mouth daily. 90 tablet 3   blood glucose meter kit and supplies KIT Use up to three  times daily as directed. 1 each 0   Blood Glucose Monitoring Suppl DEVI 1 each by Does not apply route in the morning, at noon, and at bedtime. May substitute to any manufacturer covered by patient's insurance. 1 each 0   Cholecalciferol (VITAMIN D ) 50 MCG (2000 UT) tablet Take 4,000 Units by mouth in the morning.     clopidogrel  (PLAVIX ) 75 MG tablet Take 1 tablet (75 mg total) by mouth daily. 90 tablet 2   Colchicine  (MITIGARE ) 0.6 MG CAPS Take 0.6 mg by mouth as needed (gout pain).  Continuous Glucose Sensor (DEXCOM G7 SENSOR) MISC 1 Device by Does not apply route See admin instructions. 1 each 0   cyclobenzaprine  (FLEXERIL ) 10 MG tablet Take 10 mg by mouth at bedtime as needed for muscle spasms.     diclofenac  sodium (VOLTAREN ) 1 % GEL Apply 4 g topically 4 (four) times daily. 100 g 11   Dulaglutide  (TRULICITY ) 4.5 MG/0.5ML SOAJ Inject 4.5 mg as directed once a week. 6 mL 1   fluconazole   (DIFLUCAN ) 200 MG tablet Take 200 mg by mouth as needed (yeast).     gabapentin  (NEURONTIN ) 600 MG tablet Take 600 mg by mouth 2 (two) times daily. (Patient taking differently: Take 1,200 mg by mouth 3 (three) times daily. PATIENT TAKING 1200 MG THREE TIMES DAY)     Glucagon  (BAQSIMI  ONE PACK) 3 MG/DOSE POWD Place 1 Device into the nose as needed (Low blood sugar with impaired consciousness). 2 each 3   Halobetasol & Lactic Acid (ULTRAVATE X, OINTMENT, EX) Apply 1 Application topically 2 (two) times daily.     HYDROcodone -acetaminophen  (NORCO/VICODIN) 5-325 MG tablet Take 1 tablet by mouth as needed for moderate pain (pain score 4-6).     insulin  glargine (LANTUS  SOLOSTAR) 100 UNIT/ML Solostar Pen Inject 25 Units into the skin daily. 24 mL 1   insulin  lispro (HUMALOG ) 100 UNIT/ML KwikPen Inject 15 units with break fast and 15 units with lunch, 20-22 with supper 15 min before meals 15 mL 0   Insulin  Pen Needle (BD PEN NEEDLE MINI ULTRAFINE) 31G X 5 MM MISC Use as directed with insulin . 100 each 11   levalbuterol  (XOPENEX  HFA) 45 MCG/ACT inhaler Inhale 1 puff into the lungs every 4 (four) hours as needed for wheezing. 1 each 12   levocetirizine (XYZAL ) 5 MG tablet Take 1 tablet (5 mg total) by mouth every evening. 90 tablet 1   levothyroxine  (SYNTHROID ) 125 MCG tablet Take 1 tablet (125 mcg total) by mouth daily. 90 tablet 1   lidocaine  (LIDODERM ) 5 % PLACE 1 PATCH ONTO THE SKIN DAILY. REMOVE & DISCARD PATCH WITHIN 12 HOURS OR AS DIRECTED BY MD 30 patch 0   MAGNESIUM PO Take 3 tablets by mouth daily as needed (foot cramps).     methocarbamol (ROBAXIN) 750 MG tablet Take 750 mg by mouth every 6 (six) hours as needed.     Multiple Vitamin (MULTIVITAMIN) tablet Take 1 tablet by mouth in the morning and at bedtime.     nitroGLYCERIN  (NITROSTAT ) 0.4 MG SL tablet Place 1 tablet (0.4 mg total) under the tongue every 5 (five) minutes as needed for chest pain. 25 tablet 3   pantoprazole  (PROTONIX ) 40 MG tablet  Take 1 tablet (40 mg total) by mouth 2 (two) times daily. 180 tablet 0   phenazopyridine  (PYRIDIUM ) 100 MG tablet Take 1 tablet (100 mg total) by mouth 3 (three) times daily with meals. (Patient taking differently: Take 100 mg by mouth 3 (three) times daily as needed for pain.) 9 tablet 0   traMADol  (ULTRAM ) 50 MG tablet Take 50 mg by mouth every 6 (six) hours as needed (pain).     No current facility-administered medications for this visit.    Allergies as of 07/02/2024 - Review Complete 07/02/2024  Allergen Reaction Noted   Ace inhibitors Cough    Caffeine  01/14/2019   Ciprofloxacin Itching 11/16/2016   Morphine  and codeine Itching 05/29/2019   Mucinex [guaifenesin er]  01/14/2019   Nsaids  02/03/2016   Other  12/05/2018   Silicone  Hives 05/21/2020   Tape Hives 05/10/2021    Past Medical History:  Diagnosis Date   ABDOMINAL PAIN, CHRONIC 10/20/2009   Acute cystitis 08/17/2009   ALLERGIC RHINITIS 11/21/2007   Allergy     ASYMPTOMATIC POSTMENOPAUSAL STATUS 09/29/2008   Bariatric surgery status 07/07/2010   Bariatric surgery status 07/07/2010   Qualifier: Diagnosis of  By: Kassie MD, Sean A    Cancer [facility])    uterine   Cataract    Coronary artery disease    DEPRESSION 11/21/2007   DIABETES MELLITUS, TYPE II 07/08/2007   DYSPNEA 05/20/2009   Edema 05/20/2009   Eosinophilic esophagitis 02/13/2008   FEVER UNSPECIFIED 10/20/2009   FEVER, HX OF 03/09/2010   GERD (gastroesophageal reflux disease)    Headache(784.0) 02/06/2010   Hepatomegaly 01/06/2008   HYPERLIPIDEMIA 07/08/2007   HYPERTENSION 07/08/2007   Kidney disease    followed by nephrology, per patient   LEUKOPENIA, MILD 09/29/2008   Lymphedema    left leg, dx approximately in 03/2023 per patient   Melanoma in situ of left upper arm (HCC) 04/03/2023   OBESITY 01/06/2008   OSTEOARTHRITIS 01/06/2008   Other chronic nonalcoholic liver disease 01/06/2008   PERIPHERAL NEUROPATHY 11/21/2007   Peripheral neuropathy     Postsurgical hypothyroidism 09/19/2010   SINUSITIS- ACUTE-NOS 11/21/2007   TB SKIN TEST, POSITIVE 02/06/2010   THYROID  NODULE 03/09/2010    Past Surgical History:  Procedure Laterality Date   ABDOMINAL HYSTERECTOMY     BASAL CELL CARCINOMA EXCISION     CARDIAC CATHETERIZATION     CHOLECYSTECTOMY N/A 05/11/2021   Procedure: LAPAROSCOPIC CHOLECYSTECTOMY WITH POSSIBLE  INTRAOPERATIVE CHOLANGIOGRAM;  Surgeon: Gladis Cough, MD;  Location: [facility];  Service: General;  Laterality: N/A;   COLONOSCOPY     CORONARY ATHERECTOMY N/A 05/03/2022   Procedure: CORONARY ATHERECTOMY;  Surgeon: Wendel Lurena POUR, MD;  Location: [facility] CV [facility];  Service: Cardiovascular;  Laterality: N/A;   CORONARY IMAGING/OCT N/A 05/03/2022   Procedure: INTRAVASCULAR IMAGING/OCT;  Surgeon: Wendel Lurena POUR, MD;  Location: [facility]AS[facility] CV LAB;  Service: Cardiovascular;  Laterality: N/A;   CORONARY PRESSURE/FFR STUDY N/A 05/03/2022   Procedure: INTRAVASCULAR PRESSURE WIRE/FFR STUDY;  Surgeon: Wendel Lurena POUR, MD;  Location: [facility] CV LAB;  Service: Cardiovascular;  Laterality: N/A;   CORONARY STENT INTERVENTION N/A 05/03/2022   Procedure: CORONARY STENT INTERVENTION;  Surgeon: Wendel Lurena POUR, MD;  Location: [facility];  Service: Cardiovascular;  Laterality: N/A;  lad   EYE SURGERY Bilateral 08/2018, 09/2018   FOOT SURGERY Left    10/2019   KNEE ARTHROSCOPY Left    LEFT HEART CATH AND CORONARY ANGIOGRAPHY N/A 01/20/2019   Procedure: LEFT HEART CATH AND CORONARY ANGIOGRAPHY;  Surgeon: Claudene Victory ORN, MD;  Location: [facility];  Service: Cardiovascular;  Laterality: N/A;   LYMPH NODE DISSECTION     OOPHORECTOMY     Right total knee replacement     RIGHT/LEFT HEART CATH AND CORONARY ANGIOGRAPHY N/A 05/03/2022   Procedure: RIGHT/LEFT HEART CATH AND CORONARY ANGIOGRAPHY;  Surgeon: Wendel Lurena POUR, MD;  Location: [facility];  Service: Cardiovascular;  Laterality: N/A;   ROUX-EN-Y GASTRIC  BYPASS  2011   SKIN TAG REMOVAL     TEE WITH CARDIOVERSION     THYROIDECTOMY     TONSILLECTOMY AND ADENOIDECTOMY     TOOTH EXTRACTION  2023   UPPER GASTROINTESTINAL ENDOSCOPY     UPPER GI ENDOSCOPY  2024    Review of Systems:  All systems reviewed and negative except where noted in HPI.    Physical Exam:  BP 130/72 (Cuff Size: Normal)   Pulse 85   Ht 5' 11 (1.803 m)   Wt 215 lb (97.5 kg)   BMI 29.99 kg/m  No LMP recorded. Patient has had a hysterectomy.  General: Well-nourished, well-developed in no acute distress.  Lungs: Clear to auscultation bilaterally. Non-labored. Heart: Regular rate and rhythm, no murmurs rubs or gallops.  Abdomen: Bowel sounds are normal; Abdomen is Soft; No hepatosplenomegaly, masses or hernias;  Mild bilateral lower Abdominal Tenderness; No guarding or rebound tenderness. Neuro: Alert and oriented x 3.  Grossly intact.  Psych: Alert and cooperative, normal mood and affect.   Imaging Studies: No results found.  Labs: CBC    Component Value Date/Time   WBC 4.5 06/29/2024 1243   WBC 4.7 05/07/2024 1243   RBC 3.61 (L) 06/29/2024 1243   HGB 11.0 (L) 06/29/2024 1243   HGB 13.6 05/01/2022 0943   HCT 34.5 (L) 06/29/2024 1243   HCT 40.5 05/01/2022 0943   PLT 182 06/29/2024 1243   PLT 187 05/01/2022 0943   MCV 95.6 06/29/2024 1243   MCV 96 05/01/2022 0943   MCH 30.5 06/29/2024 1243   MCHC 31.9 06/29/2024 1243   RDW 14.1 06/29/2024 1243   RDW 13.0 05/01/2022 0943   LYMPHSABS 1.0 06/29/2024 1243   LYMPHSABS 1.2 05/01/2022 0943   MONOABS 0.5 06/29/2024 1243   EOSABS 0.2 06/29/2024 1243   EOSABS 0.3 05/01/2022 0943   BASOSABS 0.0 06/29/2024 1243   BASOSABS 0.0 05/01/2022 0943    CMP     Component Value Date/Time   NA 142 05/07/2024 1243   NA 141 05/01/2022 0943   K 4.1 05/07/2024 1243   CL 111 05/07/2024 1243   CO2 25 05/07/2024 1243   GLUCOSE 78 05/07/2024 1243   BUN 17 05/07/2024 1243   BUN 22 05/01/2022 0943   CREATININE 1.29  (H) 05/07/2024 1243   CREATININE 1.51 (H) 02/22/2023 1513   CALCIUM  8.4 05/07/2024 1243   CALCIUM  8.9 01/30/2013 1017   PROT 6.3 05/07/2024 1243   PROT 5.8 (L) 10/09/2021 1214   ALBUMIN 4.0 05/07/2024 1243   ALBUMIN 4.0 10/09/2021 1214   AST 52 (H) 05/07/2024 1243   AST 25 02/29/2020 1303   ALT 75 (H) 05/07/2024 1243   ALT 37 02/29/2020 1303   ALKPHOS 173 (H) 05/07/2024 1243   BILITOT 0.3 05/07/2024 1243   BILITOT 0.4 10/09/2021 1214   BILITOT 0.4 02/29/2020 1303   GFRNONAA >60 05/13/2021 0645   GFRNONAA 63 08/16/2020 1207   GFRAA 73 08/16/2020 1207     Assessment and Plan:   Savannah [name] is a 68 y.o. y/o female presents for:  DIARRHEA - Worsening Abdominal Pain: Bilateral low abdomen LLQ>RLQ GERD - Controlled Hx Gastric Bypass Roux-en-Y Hx adenomatous colon polyp. Iron  Deficiency Anemia Multiple Abdominal Surgeries: CCY, Hyst, Gastric Bypass CAD, On Plavix   Differential for diarrhea includes infectious pathogen, IBD, IBS, EPI, microscopic colitis, bile salt diarrhea postcholecystectomy.  Plan: - Stool Studies: Stool Culture, C. Difficile Toxin PCR, Fecal Calprotectin, Fecal Pancreatic Elastase. - If stool studies are negative for infection, then I will go ahead and schedule a colonoscopy in the [facility].  We will get permission to hold Plavix  5 days prior to procedure. - Continue pantoprazole . - Continue with plan for IV iron  infusion as scheduled. - If stool studies and colonoscopy are unrevealing, then I will have her try cholestyramine or  Colestid for diarrhea.  Savannah Console, PA-C  Follow up will be based on above test results and GI symptoms.

## 2024-07-02 ENCOUNTER — Ambulatory Visit (INDEPENDENT_AMBULATORY_CARE_PROVIDER_SITE_OTHER): Admitting: Physician Assistant

## 2024-07-02 ENCOUNTER — Encounter: Payer: Self-pay | Admitting: Physician Assistant

## 2024-07-02 VITALS — BP 130/72 | HR 85 | Ht 71.0 in | Wt 215.0 lb

## 2024-07-02 DIAGNOSIS — D509 Iron deficiency anemia, unspecified: Secondary | ICD-10-CM | POA: Diagnosis not present

## 2024-07-02 DIAGNOSIS — R194 Change in bowel habit: Secondary | ICD-10-CM

## 2024-07-02 DIAGNOSIS — Z9884 Bariatric surgery status: Secondary | ICD-10-CM

## 2024-07-02 DIAGNOSIS — R197 Diarrhea, unspecified: Secondary | ICD-10-CM | POA: Diagnosis not present

## 2024-07-02 DIAGNOSIS — R1032 Left lower quadrant pain: Secondary | ICD-10-CM | POA: Diagnosis not present

## 2024-07-02 DIAGNOSIS — R1031 Right lower quadrant pain: Secondary | ICD-10-CM | POA: Diagnosis not present

## 2024-07-02 DIAGNOSIS — K219 Gastro-esophageal reflux disease without esophagitis: Secondary | ICD-10-CM

## 2024-07-02 DIAGNOSIS — Z860101 Personal history of adenomatous and serrated colon polyps: Secondary | ICD-10-CM

## 2024-07-02 DIAGNOSIS — Z860100 Personal history of colon polyps, unspecified: Secondary | ICD-10-CM

## 2024-07-02 NOTE — Progress Notes (Signed)
 Agree with assessment and plan as outlined.

## 2024-07-02 NOTE — Patient Instructions (Signed)
 Your provider has requested that you go to the basement level for lab work before leaving today. Press B on the elevator. The lab is located at the first door on the left as you exit the elevator.  Please follow up sooner if symptoms increase or worsen  Due to recent changes in healthcare laws, you may see the results of your imaging and laboratory studies on MyChart before your provider has had a chance to review them.  We understand that in some cases there may be results that are confusing or concerning to you. Not all laboratory results come back in the same time frame and the provider may be waiting for multiple results in order to interpret others.  Please give us  48 hours in order for your provider to thoroughly review all the results before contacting the office for clarification of your results.   Thank you for trusting me with your gastrointestinal care!   Ellouise Console, PA-C _______________________________________________________  If your blood pressure at your visit was 140/90 or greater, please contact your primary care physician to follow up on this.  _______________________________________________________  If you are age 85 or older, your body mass index should be between 23-30. Your Body mass index is 29.99 kg/m. If this is out of the aforementioned range listed, please consider follow up with your Primary Care Provider.  If you are age 46 or younger, your body mass index should be between 19-25. Your Body mass index is 29.99 kg/m. If this is out of the aformentioned range listed, please consider follow up with your Primary Care Provider.   ________________________________________________________  The [facility] providers would like to encourage you to use MYCHART to communicate with providers for non-urgent requests or questions.  Due to long hold times on the telephone, sending your provider a message by [facility] may be a faster and more efficient way to get a response.   Please allow 48 business hours for a response.  Please remember that this is for non-urgent requests.  _______________________________________________________

## 2024-07-03 ENCOUNTER — Other Ambulatory Visit

## 2024-07-03 DIAGNOSIS — R197 Diarrhea, unspecified: Secondary | ICD-10-CM

## 2024-07-03 NOTE — Progress Notes (Signed)
 Called patient with pre-procedure instructions for Monday.   Patient informed of:   Time to arrive for procedure (0730). Remain NPO past midnight.  Must have a ride home and a responsible adult to remain with them for 24 hours post procedure. Instructed to take am meds with sip of water. Confirmed patient stopped GLP-1 for at least one week before procedure.  Patient reports understanding and no other questions at this time.

## 2024-07-04 LAB — CLOSTRIDIUM DIFFICILE BY PCR: Toxigenic C. Difficile by PCR: NEGATIVE

## 2024-07-04 LAB — SPECIMEN STATUS REPORT

## 2024-07-05 NOTE — Anesthesia Preprocedure Evaluation (Signed)
 Anesthesia Evaluation  Patient identified by MRN, date of birth, ID band Patient awake    Reviewed: Allergy  & Precautions, NPO status , Patient's Chart, lab work & pertinent test results  History of Anesthesia Complications Negative for: history of anesthetic complications  Airway Mallampati: II  TM Distance: >3 FB Neck ROM: Full    Dental no notable dental hx. (+) Teeth Intact, Dental Advisory Given, Implants   Pulmonary shortness of breath, former smoker   Pulmonary exam normal breath sounds clear to auscultation       Cardiovascular hypertension, + CAD  Normal cardiovascular exam+ Valvular Problems/Murmurs (severe) MR  Rhythm:Regular Rate:Normal  05/14/2024 TTE  1. Left ventricular ejection fraction, by estimation, is 60 to 65%. The  left ventricle has normal function. The left ventricle has no regional  wall motion abnormalities. There is mild left ventricular hypertrophy.  Left ventricular diastolic parameters  were normal.   2. Right ventricular systolic function is normal. The right ventricular  size is normal.   3. MR is more posteriorly directed, wrapping around posterior wall of  left atrium. WOuld recommend TEE to further define mechanism of  regurgitation.. The mitral valve is degenerative. Severe mitral valve  regurgitation. Moderate mitral stenosis.   4. The aortic valve is tricuspid. Aortic valve regurgitation is trivial.  Aortic valve sclerosis/calcification is present, without any evidence of  aortic stenosis.      Neuro/Psych  PSYCHIATRIC DISORDERS  Depression     Neuromuscular disease    GI/Hepatic ,GERD  ,,  Endo/Other  diabetes, Poorly Controlled, Type 2, Insulin  DependentHypothyroidism    Renal/GU      Musculoskeletal  (+) Arthritis ,    Abdominal   Peds  Hematology Lab Results      Component                Value               Date                      WBC                      4.5                  06/29/2024                HGB                      11.0 (L)            06/29/2024                HCT                      34.5 (L)            06/29/2024                MCV                      95.6                06/29/2024                PLT                      182  06/29/2024              Anesthesia Other Findings  See list:  Reproductive/Obstetrics                              Anesthesia Physical Anesthesia Plan  ASA: 3  Anesthesia Plan: MAC   Post-op Pain Management: Minimal or no pain anticipated   Induction: Intravenous  PONV Risk Score and Plan: Treatment may vary due to age or medical condition and Propofol  infusion  Airway Management Planned: Nasal Cannula and Natural Airway  Additional Equipment: None  Intra-op Plan:   Post-operative Plan:   Informed Consent: I have reviewed the patients History and Physical, chart, labs and discussed the procedure including the risks, benefits and alternatives for the proposed anesthesia with the patient or authorized representative who has indicated his/her understanding and acceptance.     Dental advisory given  Plan Discussed with: CRNA and Surgeon  Anesthesia Plan Comments: (TEE for severe MR)         Anesthesia Quick Evaluation

## 2024-07-06 ENCOUNTER — Ambulatory Visit (HOSPITAL_COMMUNITY)

## 2024-07-06 ENCOUNTER — Ambulatory Visit (HOSPITAL_COMMUNITY)
Admission: RE | Admit: 2024-07-06 | Discharge: 2024-07-06 | Disposition: A | Discharge status: Home / Self Care | Attending: Internal Medicine | Admitting: Internal Medicine

## 2024-07-06 ENCOUNTER — Encounter (HOSPITAL_COMMUNITY)
Admission: RE | Disposition: A | Discharge status: Home / Self Care | Payer: Self-pay | Source: Home / Self Care | Attending: Internal Medicine

## 2024-07-06 ENCOUNTER — Ambulatory Visit (HOSPITAL_BASED_OUTPATIENT_CLINIC_OR_DEPARTMENT_OTHER): Payer: Self-pay | Admitting: Anesthesiology

## 2024-07-06 ENCOUNTER — Ambulatory Visit (HOSPITAL_COMMUNITY): Payer: Self-pay | Admitting: Anesthesiology

## 2024-07-06 ENCOUNTER — Other Ambulatory Visit: Payer: Self-pay

## 2024-07-06 ENCOUNTER — Ambulatory Visit: Payer: Self-pay | Admitting: Physician Assistant

## 2024-07-06 ENCOUNTER — Telehealth: Payer: Self-pay | Admitting: *Deleted

## 2024-07-06 ENCOUNTER — Encounter (HOSPITAL_COMMUNITY): Payer: Self-pay | Admitting: Internal Medicine

## 2024-07-06 DIAGNOSIS — I340 Nonrheumatic mitral (valve) insufficiency: Secondary | ICD-10-CM

## 2024-07-06 DIAGNOSIS — Z794 Long term (current) use of insulin: Secondary | ICD-10-CM | POA: Diagnosis not present

## 2024-07-06 DIAGNOSIS — Z7985 Long-term (current) use of injectable non-insulin antidiabetic drugs: Secondary | ICD-10-CM | POA: Insufficient documentation

## 2024-07-06 DIAGNOSIS — I110 Hypertensive heart disease with heart failure: Secondary | ICD-10-CM | POA: Diagnosis not present

## 2024-07-06 DIAGNOSIS — I361 Nonrheumatic tricuspid (valve) insufficiency: Secondary | ICD-10-CM

## 2024-07-06 DIAGNOSIS — E119 Type 2 diabetes mellitus without complications: Secondary | ICD-10-CM | POA: Insufficient documentation

## 2024-07-06 DIAGNOSIS — I081 Rheumatic disorders of both mitral and tricuspid valves: Secondary | ICD-10-CM | POA: Insufficient documentation

## 2024-07-06 DIAGNOSIS — I5032 Chronic diastolic (congestive) heart failure: Secondary | ICD-10-CM | POA: Insufficient documentation

## 2024-07-06 DIAGNOSIS — Z7902 Long term (current) use of antithrombotics/antiplatelets: Secondary | ICD-10-CM | POA: Insufficient documentation

## 2024-07-06 DIAGNOSIS — Z87891 Personal history of nicotine dependence: Secondary | ICD-10-CM

## 2024-07-06 DIAGNOSIS — I890 Lymphedema, not elsewhere classified: Secondary | ICD-10-CM | POA: Diagnosis not present

## 2024-07-06 DIAGNOSIS — I10 Essential (primary) hypertension: Secondary | ICD-10-CM

## 2024-07-06 DIAGNOSIS — Z79899 Other long term (current) drug therapy: Secondary | ICD-10-CM | POA: Diagnosis not present

## 2024-07-06 DIAGNOSIS — Z9884 Bariatric surgery status: Secondary | ICD-10-CM | POA: Insufficient documentation

## 2024-07-06 DIAGNOSIS — I2510 Atherosclerotic heart disease of native coronary artery without angina pectoris: Secondary | ICD-10-CM | POA: Insufficient documentation

## 2024-07-06 DIAGNOSIS — Z955 Presence of coronary angioplasty implant and graft: Secondary | ICD-10-CM | POA: Insufficient documentation

## 2024-07-06 DIAGNOSIS — E785 Hyperlipidemia, unspecified: Secondary | ICD-10-CM | POA: Insufficient documentation

## 2024-07-06 LAB — POCT I-STAT, CHEM 8
BUN: 15 mg/dL (ref 8–23)
Calcium, Ion: 1.17 mmol/L (ref 1.15–1.40)
Chloride: 112 mmol/L — ABNORMAL HIGH (ref 98–111)
Creatinine, Ser: 1.3 mg/dL — ABNORMAL HIGH (ref 0.44–1.00)
Glucose, Bld: 138 mg/dL — ABNORMAL HIGH (ref 70–99)
HCT: 28 % — ABNORMAL LOW (ref 36.0–46.0)
Hemoglobin: 9.5 g/dL — ABNORMAL LOW (ref 12.0–15.0)
Potassium: 4.3 mmol/L (ref 3.5–5.1)
Sodium: 142 mmol/L (ref 135–145)
TCO2: 20 mmol/L — ABNORMAL LOW (ref 22–32)

## 2024-07-06 LAB — ECHO TEE: MV VTI: 1.63 cm2

## 2024-07-06 SURGERY — TRANSESOPHAGEAL ECHOCARDIOGRAM (TEE) (CATHLAB)
Anesthesia: Monitor Anesthesia Care

## 2024-07-06 MED ORDER — SODIUM CHLORIDE 0.9 % IV SOLN: INTRAVENOUS | Status: DC

## 2024-07-06 MED ORDER — PROPOFOL 10 MG/ML IV BOLUS
INTRAVENOUS | Status: DC | PRN
  Administered 2024-07-06: 100 ug/kg/min via INTRAVENOUS
  Administered 2024-07-06: 50 mg via INTRAVENOUS

## 2024-07-06 MED ORDER — LIDOCAINE 2% (20 MG/ML) 5 ML SYRINGE
INTRAMUSCULAR | Status: DC | PRN
  Administered 2024-07-06: 100 mg via INTRAVENOUS

## 2024-07-06 MED ORDER — SUCRALFATE 1 GM/10ML PO SUSP: 1.0000 g | Freq: Four times a day (QID) | ORAL | 1 refills | Status: DC | PRN

## 2024-07-06 NOTE — Anesthesia Procedure Notes (Signed)
 Procedure Name: MAC Date/Time: 07/06/2024 8:20 AM  Performed by: Viviana Almarie DASEN, CRNAPre-anesthesia Checklist: Patient identified, Suction available, Patient being monitored, Emergency Drugs available and Timeout performed Patient Re-evaluated:Patient Re-evaluated prior to induction Oxygen  Delivery Method: Nasal cannula Induction Type: IV induction Airway Equipment and Method: Bite block Placement Confirmation: positive ETCO2

## 2024-07-06 NOTE — Therapy (Signed)
 OUTPATIENT PHYSICAL THERAPY LOWER EXTREMITY LYMPHEDEMA EVALUATION  Patient Name: Savannah [name] MRN: [id number] DOB:1956-02-25, 68 y.o., female Today's Date: 07/07/2024  END OF SESSION:  PT End of Session - 07/07/24 1156     Visit Number 1    Number of Visits 2    Date for PT Re-Evaluation 09/06/24    Authorization Type none    PT Start Time 1102    PT Stop Time 1155    PT Time Calculation (min) 53 min    Activity Tolerance Patient tolerated treatment well    Behavior During Therapy WFL for tasks assessed/performed           Past Medical History:  Diagnosis Date   ABDOMINAL PAIN, CHRONIC 10/20/2009   Acute cystitis 08/17/2009   ALLERGIC RHINITIS 11/21/2007   Allergy     ASYMPTOMATIC POSTMENOPAUSAL STATUS 09/29/2008   Bariatric surgery status 07/07/2010   Bariatric surgery status 07/07/2010   Qualifier: Diagnosis of  By: Kassie MD, Sean A    Cancer [facility])    uterine   Cataract    Coronary artery disease    DEPRESSION 11/21/2007   DIABETES MELLITUS, TYPE II 07/08/2007   DYSPNEA 05/20/2009   Edema 05/20/2009   Eosinophilic esophagitis 02/13/2008   FEVER UNSPECIFIED 10/20/2009   FEVER, HX OF 03/09/2010   GERD (gastroesophageal reflux disease)    Headache(784.0) 02/06/2010   Hepatomegaly 01/06/2008   HYPERLIPIDEMIA 07/08/2007   HYPERTENSION 07/08/2007   Kidney disease    followed by nephrology, per patient   LEUKOPENIA, MILD 09/29/2008   Lymphedema    left leg, dx approximately in 03/2023 per patient   Melanoma in situ of left upper arm (HCC) 04/03/2023   OBESITY 01/06/2008   OSTEOARTHRITIS 01/06/2008   Other chronic nonalcoholic liver disease 01/06/2008   PERIPHERAL NEUROPATHY 11/21/2007   Peripheral neuropathy    Postsurgical hypothyroidism 09/19/2010   SINUSITIS- ACUTE-NOS 11/21/2007   TB SKIN TEST, POSITIVE 02/06/2010   THYROID  NODULE 03/09/2010   Past Surgical History:  Procedure Laterality Date   ABDOMINAL HYSTERECTOMY     BASAL CELL CARCINOMA  EXCISION     CARDIAC CATHETERIZATION     CHOLECYSTECTOMY N/A 05/11/2021   Procedure: LAPAROSCOPIC CHOLECYSTECTOMY WITH POSSIBLE  INTRAOPERATIVE CHOLANGIOGRAM;  Surgeon: Gladis Cough, MD;  Location: [facility];  Service: General;  Laterality: N/A;   COLONOSCOPY     CORONARY ATHERECTOMY N/A 05/03/2022   Procedure: CORONARY ATHERECTOMY;  Surgeon: Wendel Lurena POUR, MD;  Location: [facility];  Service: Cardiovascular;  Laterality: N/A;   CORONARY IMAGING/OCT N/A 05/03/2022   Procedure: INTRAVASCULAR IMAGING/OCT;  Surgeon: Wendel Lurena POUR, MD;  Location: [facility];  Service: Cardiovascular;  Laterality: N/A;   CORONARY PRESSURE/FFR STUDY N/A 05/03/2022   Procedure: INTRAVASCULAR PRESSURE WIRE/FFR STUDY;  Surgeon: Wendel Lurena POUR, MD;  Location: [facility];  Service: Cardiovascular;  Laterality: N/A;   CORONARY STENT INTERVENTION N/A 05/03/2022   Procedure: CORONARY STENT INTERVENTION;  Surgeon: Wendel Lurena POUR, MD;  Location: [facility];  Service: Cardiovascular;  Laterality: N/A;  lad   EYE SURGERY Bilateral 08/2018, 09/2018   FOOT SURGERY Left    10/2019   KNEE ARTHROSCOPY Left    LEFT HEART CATH AND CORONARY ANGIOGRAPHY N/A 01/20/2019   Procedure: LEFT HEART CATH AND CORONARY ANGIOGRAPHY;  Surgeon: Claudene Victory ORN, MD;  Location: [facility];  Service: Cardiovascular;  Laterality: N/A;   LYMPH NODE DISSECTION     OOPHORECTOMY     Right total knee  replacement     RIGHT/LEFT HEART CATH AND CORONARY ANGIOGRAPHY N/A 05/03/2022   Procedure: RIGHT/LEFT HEART CATH AND CORONARY ANGIOGRAPHY;  Surgeon: Wendel Lurena POUR, MD;  Location: [facility];  Service: Cardiovascular;  Laterality: N/A;   ROUX-EN-Y GASTRIC BYPASS  2011   SKIN TAG REMOVAL     TEE WITH CARDIOVERSION     THYROIDECTOMY     TONSILLECTOMY AND ADENOIDECTOMY     TOOTH EXTRACTION  2023   TRANSESOPHAGEAL ECHOCARDIOGRAM (CATH LAB) N/A 07/06/2024   Procedure: TRANSESOPHAGEAL ECHOCARDIOGRAM;   Surgeon: Loni Soyla LABOR, MD;  Location: [facility];  Service: Cardiovascular;  Laterality: N/A;   UPPER GASTROINTESTINAL ENDOSCOPY     UPPER GI ENDOSCOPY  2024   Patient Active Problem List   Diagnosis Date Noted   Severe mitral valve regurgitation 07/06/2024   Mitral valve insufficiency 07/06/2024   Bilateral carpal tunnel syndrome 03/13/2022   Leg edema 03/01/2022   Nondisplaced comminuted fracture of left patella, initial encounter for closed fracture 08/01/2021   Arthritis of carpometacarpal (CMC) joint of right thumb 07/28/2021   Primary osteoarthritis of right distal radioulnar joint 07/28/2021   Arthritis of wrist, right, degenerative 07/28/2021   History of malignant neoplasm of uterine body 07/27/2021   Osteopenia 01/14/2021   Sesamoiditis of left foot 11/09/2020   Posterior tibial tendinitis of left lower extremity 11/09/2020   Type 2 diabetes mellitus with diabetic peripheral angiopathy without gangrene, with long-term current use of insulin  (HCC) 10/21/2020   Iron  deficiency anemia 03/14/2020   Lesion of vulva 09/02/2019   Menorrhagia 09/02/2019   Neuropathy 09/02/2019   Irregular bowel habits 06/25/2019   Coronary artery disease involving native coronary artery of native heart without angina pectoris    Temporomandibular joint disorder 12/05/2018   Fatigue 12/05/2018   Inverted nipple 12/05/2018   Senile purpura (HCC) 11/21/2018   GERD with esophagitis 11/21/2018   Pseudogout involving multiple joints 11/21/2018   Lipoma 07/22/2017   Lichen sclerosus 11/16/2016   H/O gastric bypass 11/16/2016   Depression 11/16/2016   Diabetic polyneuropathy associated with type 2 diabetes mellitus (HCC) 11/16/2016   Uterine cancer (HCC) s/p hysterectomy 1997 01/04/2012   Postsurgical hypothyroidism 09/19/2010   Diabetic neuropathy (HCC) 11/21/2007   Allergic rhinitis 11/21/2007   Dyslipidemia associated with type 2 diabetes mellitus (HCC) 07/08/2007    PCP: Worth Kitty, MD  REFERRING PROVIDER: Kitty Worth HERO, MD  REFERRING DIAG: R60.0 (ICD-10-CM) - Leg edema  THERAPY DIAG:  Lymphedema, not elsewhere classified  Difficulty in walking, not elsewhere classified  Rationale for Evaluation and Treatment: Rehabilitation  ONSET DATE: Prior to 1997  SUBJECTIVE:  SUBJECTIVE STATEMENT: I gained weight and now it has gotten worse.  I have some heart trouble lately too I need a new mitral valve.  The left knee knee and upper leg are also swollen.    PERTINENT HISTORY:  Uterine cancer in 1997 with lymph node removal (pt reports the surgeon told her he got all the lymph nodes he could find bilaterally), pt reports her vascular doctor told her the left leg edema was vascular, diabetic, has hx of cardiac stent   PAIN:  Are you having pain? Yes: NPRS scale: 1 Pain location: goes up to an 8/10 when standing, lower back Pain description: sharp Aggravating factors: standing up, moving Relieving factors: sitting but not for too long in same position  PRECAUTIONS: Other: R Knee TKA  WEIGHT BEARING RESTRICTIONS: No  FALLS:  Has patient fallen in last 6 months? x1  LIVING ENVIRONMENT: Lives with: lives alone Lives in: House/apartment town home Stairs: Yes; External: 8 steps; can reach both can go in back with 1 step Has following equipment at home: Single point cane  OCCUPATION: retired  LEISURE: pt has not exercised recently  HAND DOMINANCE: right   PRIOR LEVEL OF FUNCTION: Independent  PATIENT GOALS: to get the swelling down   OBJECTIVE:  COGNITION:  Overall cognitive status: Within functional limits for tasks assessed   PALPATION: +2 pitting edema Rt leg to knee with a pillow soft texture,  +2 mid thigh on the Lt with a pillow texture on the lower leg and  more of a fibrotic texture on the upper thigh  OBSERVATIONS / OTHER ASSESSMENTS: LLE about 30% larger than R throughout, R ankle full  SENSATION: pt reports neuropathy in bilateral feet  POSTURE: forward head, rounded shoulders  LYMPHEDEMA ASSESSMENTS:    LE LANDMARK RIGHT 07/07/2024 07/07/24  At groin    30 cm proximal to suprapatella    20 cm proximal to suprapatella 47 53  10 cm proximal to suprapatella 40.3 46.1  At midpatella / popliteal crease 39 41.5  30 cm proximal to floor at lateral plantar foot 32.1 37.5  20 cm proximal to floor at lateral plantar foot 26.1 30.4  10 cm proximal to floor at lateral plantar foot 25.5 28  Circumference of ankle/heel    5 cm proximal to 1st MTP joint 22.7 23  Across MTP joint 24 24  Around proximal great toe 8 8.2  (Blank rows = not tested)  LE LANDMARK LEFT 07/07/2024 After CDT 04/05/23 07/08/24  At groin     30 cm proximal to suprapatella     20 cm proximal to suprapatella 46 44.5 53  10 cm proximal to suprapatella 41.2 42 47.5  At midpatella / popliteal crease 40.3 40 41  30 cm proximal to floor at lateral plantar foot 37.5 38.8 41.5  20 cm proximal to floor at lateral plantar foot 30.3 31.8 32.2  10 cm proximal to floor at lateral plantar foot 28.1 29 29.5  Circumference of ankle/heel  38.4 39  5 cm proximal to 1st MTP joint 25 26 26.4  Across MTP joint 23.4 25 25.5  Around proximal great toe 8.7 8.6 9.4  (Blank rows = not tested)    TODAY'S TREATMENT:  DATE:  07/07/24 Discussed treatment options.  Discussed CDT with clearance from cardiology due to SOB and type of pitting edema.   Sent a note to Leah from [employer] regarding heart status and she will reach out for cardiac clearance before getting a pump.   Decided to have to measured for custom thigh high garments for bilateral lower extremities  thigh high to have to wear while she undergoes any heart procedure she may have to have and to get more stable on that end and then she can decide if she wants to do CDT.  Pt will require custom make flat knit class 2 thigh high garments with knee flexure zone, ankle comfort zone, and silicone borders to keep the garment in place as she had trouble with her last garments staying in place.  She would like 1 of each leg to allow for future ordering.    PATIENT EDUCATION:  Education details: per today's note Person educated: Patient Education method: Explanation Education comprehension: verbalized understanding  HOME EXERCISE PROGRAM: Continue with elevation and night garment use  ASSESSMENT:  CLINICAL IMPRESSION: Pt is a 68 year old patient known to this clinic due to bil LE lymphedema Lt>Rt.  She completed CDT of the Lt leg and was given custom garments in the past.  She also knows how to self bandage.  She has had a rough year with gaining 60 pounds, having thyroid  dysfunction, and now finding out she has a poorly functioning mitral valve and CHF.  She will meet with the cardiac team about plan next week but she may have to have open heart surgery.  Her Rt leg has increased in volume from to since last measurements with soft pitting in the lower leg only that feels cardiac related.  Her Lt leg has increased from from ending last CDT to today.  Decided to have her be measured for custom thigh high garments for bilateral lower extremities thigh high to have to wear while she undergoes any heart procedure she may have to have and to get more stable on that end and then she can decide if she wants to do CDT.  Pt will require custom make flat knit class 2 thigh high garments with knee flexure zone, ankle comfort zone, and silicone borders to keep the garment in place as she had trouble with her last garments staying in place.  She would like 1 of each leg to allow for future  ordering. She is also starting to have some skin changes on the Left leg with small papillomas and some chronic cellulitis.  Stage 3 at this point.   OBJECTIVE IMPAIRMENTS: decreased knowledge of condition, decreased knowledge of use of DME, difficulty walking, increased edema, and pain.   ACTIVITY LIMITATIONS: standing and locomotion level  PARTICIPATION LIMITATIONS: community activity  PERSONAL FACTORS: Fitness and Time since onset of injury/illness/exacerbation are also affecting patient's functional outcome.   REHAB POTENTIAL: Good  CLINICAL DECISION MAKING: Evolving/moderate complexity  EVALUATION COMPLEXITY: Moderate   GOALS: Goals reviewed with patient? Yes   LONG TERM GOALS: Target date: 09/06/24  Pt will be independent in self MLD for long term management of lymphedema.  Baseline:  Goal status: MET  2.  Pt will receive trial of FlexiTouch compression pump for long term management of lymphedema.  Baseline:  Goal status: MET  3.  Pt will obtain appropriate day and night time garments for long term management of lymphedema.  Baseline:  Goal status: Has night garments,  needs to get new day garments     PLAN:  PT FREQUENCY: starting with garments only and then CDT if pt would like   PT DURATION: 4 weeks  PLANNED INTERVENTIONS: Therapeutic exercises, Therapeutic activity, Patient/Family education, Self Care, Orthotic/Fit training, Manual lymph drainage, Compression bandaging, Taping, Vasopneumatic device, Manual therapy, and Re-evaluation  PLAN FOR NEXT SESSION: let pt know pump will be getting clearance, how was MD visit for heart plan?    Larue Saddie SAUNDERS, PT 07/07/2024, 11:57 AM

## 2024-07-06 NOTE — Anesthesia Postprocedure Evaluation (Signed)
 Anesthesia Post Note  Patient: Savannah [name]  Procedure(s) Performed: TRANSESOPHAGEAL ECHOCARDIOGRAM     Patient location during evaluation: Endoscopy Anesthesia Type: MAC Level of consciousness: awake and alert Pain management: pain level controlled Vital Signs Assessment: post-procedure vital signs reviewed and stable Respiratory status: spontaneous breathing, nonlabored ventilation, respiratory function stable and patient connected to nasal cannula oxygen  Cardiovascular status: blood pressure returned to baseline and stable Postop Assessment: no apparent nausea or vomiting Anesthetic complications: no   No notable events documented.  Last Vitals:  Vitals:   07/06/24 0925 07/06/24 0930  BP: 138/80   Pulse: 76 75  Resp:    Temp:    SpO2: 95% 97%    Last Pain:  Vitals:   07/06/24 0925  TempSrc:   PainSc: 0-No pain                 [name]

## 2024-07-06 NOTE — Telephone Encounter (Signed)
  Carafate  4 times daily PRN ordered for : esophageal pain after endoscopies  per Dr Loni FATE sent to [facility]

## 2024-07-06 NOTE — Interval H&P Note (Signed)
 History and Physical Interval Note:  07/06/2024 8:07 AM  Savannah [name]  has presented today for surgery, with the diagnosis of SEVERE MR.  The various methods of treatment have been discussed with the patient and family. After consideration of risks, benefits and other options for treatment, the patient has consented to  Procedure(s): TRANSESOPHAGEAL ECHOCARDIOGRAM (N/A) as a surgical intervention.  The patient's history has been reviewed, patient examined, no change in status, stable for surgery.  I have reviewed the patient's chart and labs.  Questions were answered to the patient's satisfaction.     Wael Maestas A Canio Winokur

## 2024-07-06 NOTE — Transfer of Care (Signed)
 Immediate Anesthesia Transfer of Care Note  Patient: Savannah [name]  Procedure(s) Performed: TRANSESOPHAGEAL ECHOCARDIOGRAM  Patient Location: PACU  Anesthesia Type:MAC  Level of Consciousness: awake, alert , oriented, and patient cooperative  Airway & Oxygen  Therapy: Patient Spontanous Breathing  Post-op Assessment: Report given to RN, Post -op Vital signs reviewed and stable, Patient moving all extremities X 4, and Patient able to stick tongue midline  Post vital signs: Reviewed and stable  Last Vitals:  Vitals Value Taken Time  BP 117/59 07/06/24 08:53  Temp 98.1   Pulse 79 07/06/24 08:53  Resp 15 07/06/24 08:53  SpO2 93 % 07/06/24 08:53  Vitals shown include unfiled device data.  Last Pain:  Vitals:   07/06/24 0741  TempSrc: Temporal         Complications: No notable events documented.

## 2024-07-06 NOTE — CV Procedure (Addendum)
 INDICATIONS: MR  PROCEDURE:   Informed consent was obtained prior to the procedure. The risks, benefits and alternatives for the procedure were discussed and the patient comprehended these risks.  Risks include, but are not limited to, cough, sore throat, vomiting, nausea, somnolence, esophageal and stomach trauma or perforation, bleeding, low blood pressure, aspiration, pneumonia, infection, trauma to the teeth and death.    After a procedural time-out, the oropharynx was anesthetized with 20% benzocaine spray.   During this procedure the patient was administered propofol  per anesthesia.  The patient's heart rate, blood pressure, and oxygen  saturation were monitored continuously during the procedure. The period of conscious sedation was 30 minutes, of which I was present face-to-face 100% of this time.  The transesophageal probe was inserted in the esophagus and stomach without difficulty and multiple views were obtained.  The patient was kept under observation until the patient left the procedure room.  The patient left the procedure room in stable condition.   Agitated microbubble saline contrast was administered.  COMPLICATIONS:    There were no immediate complications.  FINDINGS:   FORMAL ECHOCARDIOGRAM REPORT PENDING Severe MR, central, commissural, likely atrial functional. Grossly normal appearing MV otherwise, mildly degenerative. Moderate TR, TR vel 3.4 m/s IVC with ra pressure 8 mmHg. RVSP ~ 54 mmHg LVEF 60-65%, no RWMA No PFO.   RECOMMENDATIONS:    Dc when alert Will contact research about trial for diuresis given intolerance to lasix .  Time Spent Directly with the Patient:  45 minutes   Savannah [name] A Aramis Zobel 07/06/2024, 8:54 AM

## 2024-07-07 ENCOUNTER — Other Ambulatory Visit: Payer: Self-pay

## 2024-07-07 ENCOUNTER — Ambulatory Visit: Attending: Family Medicine | Admitting: Rehabilitation

## 2024-07-07 ENCOUNTER — Encounter: Payer: Self-pay | Admitting: Rehabilitation

## 2024-07-07 ENCOUNTER — Encounter: Admitting: Rehabilitation

## 2024-07-07 DIAGNOSIS — R262 Difficulty in walking, not elsewhere classified: Secondary | ICD-10-CM | POA: Insufficient documentation

## 2024-07-07 DIAGNOSIS — I890 Lymphedema, not elsewhere classified: Secondary | ICD-10-CM | POA: Insufficient documentation

## 2024-07-07 LAB — STOOL CULTURE: E coli, Shiga toxin Assay: NEGATIVE

## 2024-07-07 LAB — CALPROTECTIN, FECAL: Calprotectin, Fecal: 52 ug/g (ref 0–120)

## 2024-07-07 NOTE — Progress Notes (Signed)
 Stool studies are negative for infection.  I sent patient test results through MyChart. Go ahead and schedule colonoscopy in [facility] with Dr. Leigh.  Diagnosis diarrhea, evaluate for microscopic colitis.  Patient needs cardiac permission to hold Plavix  5 days before colonoscopy procedure. Ellouise Console, PA-C

## 2024-07-08 ENCOUNTER — Ambulatory Visit

## 2024-07-08 ENCOUNTER — Encounter: Payer: Self-pay | Admitting: Internal Medicine

## 2024-07-08 VITALS — BP 149/82 | HR 73 | Temp 97.9°F | Resp 12 | Ht 71.0 in | Wt 219.4 lb

## 2024-07-08 DIAGNOSIS — D509 Iron deficiency anemia, unspecified: Secondary | ICD-10-CM | POA: Diagnosis not present

## 2024-07-08 MED ORDER — DIPHENHYDRAMINE HCL 25 MG PO CAPS
25.0000 mg | ORAL_CAPSULE | Freq: Once | ORAL | Status: AC
  Administered 2024-07-08: 25 mg via ORAL
  Filled 2024-07-08: qty 1

## 2024-07-08 MED ORDER — ACETAMINOPHEN 325 MG PO TABS
650.0000 mg | ORAL_TABLET | Freq: Once | ORAL | Status: DC
  Filled 2024-07-08: qty 2

## 2024-07-08 MED ORDER — SODIUM CHLORIDE 0.9 % IV SOLN
510.0000 mg | Freq: Once | INTRAVENOUS | Status: AC
  Administered 2024-07-08: 510 mg via INTRAVENOUS
  Filled 2024-07-08: qty 17

## 2024-07-08 NOTE — Progress Notes (Signed)
 Diagnosis: Iron  Deficiency Anemia  Provider:  Mannam, Praveen MD  Procedure: IV Infusion  IV Type: Peripheral, IV Location: R Antecubital  Feraheme  (Ferumoxytol ), Dose: 510 mg  Infusion Start Time: 1601  Infusion Stop Time: 1619  Post Infusion IV Care: Observation period completed and Peripheral IV Discontinued  Discharge: Condition: Good, Destination: Home . AVS Declined  Performed by:  Rocky FORBES Sar, RN

## 2024-07-09 ENCOUNTER — Encounter: Payer: Self-pay | Admitting: *Deleted

## 2024-07-10 ENCOUNTER — Encounter: Payer: Self-pay | Admitting: Internal Medicine

## 2024-07-10 ENCOUNTER — Ambulatory Visit: Attending: Internal Medicine | Admitting: Internal Medicine

## 2024-07-10 ENCOUNTER — Other Ambulatory Visit (HOSPITAL_COMMUNITY): Payer: Self-pay

## 2024-07-10 VITALS — BP 132/60 | HR 62 | Resp 16 | Ht 71.0 in | Wt 218.5 lb

## 2024-07-10 DIAGNOSIS — E785 Hyperlipidemia, unspecified: Secondary | ICD-10-CM | POA: Insufficient documentation

## 2024-07-10 DIAGNOSIS — I340 Nonrheumatic mitral (valve) insufficiency: Secondary | ICD-10-CM | POA: Diagnosis not present

## 2024-07-10 DIAGNOSIS — R0600 Dyspnea, unspecified: Secondary | ICD-10-CM | POA: Insufficient documentation

## 2024-07-10 DIAGNOSIS — I5032 Chronic diastolic (congestive) heart failure: Secondary | ICD-10-CM | POA: Insufficient documentation

## 2024-07-10 DIAGNOSIS — Z955 Presence of coronary angioplasty implant and graft: Secondary | ICD-10-CM | POA: Insufficient documentation

## 2024-07-10 DIAGNOSIS — R600 Localized edema: Secondary | ICD-10-CM | POA: Insufficient documentation

## 2024-07-10 DIAGNOSIS — Z79899 Other long term (current) drug therapy: Secondary | ICD-10-CM | POA: Diagnosis not present

## 2024-07-10 DIAGNOSIS — I2510 Atherosclerotic heart disease of native coronary artery without angina pectoris: Secondary | ICD-10-CM | POA: Diagnosis not present

## 2024-07-10 DIAGNOSIS — D509 Iron deficiency anemia, unspecified: Secondary | ICD-10-CM | POA: Diagnosis not present

## 2024-07-10 LAB — PANCREATIC ELASTASE, FECAL: Pancreatic Elastase-1, Stool: 187 ug/g — ABNORMAL LOW (ref >200)

## 2024-07-10 MED ORDER — FUROSEMIDE 20 MG PO TABS
20.0000 mg | ORAL_TABLET | Freq: Every day | ORAL | 0 refills | Status: DC
  Filled 2024-07-10: qty 90, 90d supply, fill #0

## 2024-07-10 MED ORDER — POTASSIUM CHLORIDE CRYS ER 20 MEQ PO TBCR
20.0000 meq | EXTENDED_RELEASE_TABLET | Freq: Every day | ORAL | 0 refills | Status: DC
  Filled 2024-07-10: qty 90, 90d supply, fill #0

## 2024-07-10 NOTE — Patient Instructions (Signed)
 Medication Instructions:   START TAKING FUROSEMIDE  (LASIX ) 20 MG BY MOUTH DAILY  START TAKING POTASSIUM CHLORIDE  20 mEq BY MOUTH DAILY  *If you need a refill on your cardiac medications before your next appointment, please call your pharmacy*   Lab Work:  ONE WEEK HERE AT OUR [facility] FIRST FLOOR--[facility]  If you have labs (blood work) drawn today and your tests are completely normal, you will receive your results only by: MyChart Message (if you have MyChart) OR A paper copy in the mail If you have any lab test that is abnormal or we need to change your treatment, we will call you to review the results.    Follow-Up:  AS PLANNED WITH DR. ACHARYA ON 08/03/24 AT 9:20 AM   Other Instructions  PLEASE CONTINUE MONITORING AND TRACKING YOUR BLOOD PRESSURES DAILY  Blood Pressure Record Sheet To take your blood pressure, you will need a blood pressure machine. You can buy a blood pressure machine (blood pressure monitor) at your clinic, drug store, or online. When choosing one, consider: An automatic monitor that has an arm cuff. A cuff that wraps snugly around your upper arm. You should be able to fit only one finger between your arm and the cuff. A device that stores blood pressure reading results. Do not choose a monitor that measures your blood pressure from your wrist or finger. Follow your health care provider's instructions for how to take your blood pressure. To use this form: Take your blood pressure medications every day These measurements should be taken when you have been at rest for at least 10-15 min Take at least 2 readings with each blood pressure check. This makes sure the results are correct. Wait 1-2 minutes between measurements. Write down the results in the spaces on this form. Keep in mind it should always be recorded systolic over diastolic. Both numbers are important.  Repeat this every day for 2-3 weeks, or as told by your health care provider.  Make a  follow-up appointment with your health care provider to discuss the results.  Blood Pressure Log Date Medications taken? (Y/N) Blood Pressure Time of Day

## 2024-07-14 NOTE — Progress Notes (Signed)
 Fecal pancreatic elastase stool test is mildly low.  This is consistent with mild to moderate pancreatic insufficiency.  I recommend patient try Creon  36,000 lipase units, take 2 capsules with each meal and 1 with each snack to help with diarrhea.  Okay to give samples and prescription. Ellouise Console, PA-C

## 2024-07-14 NOTE — Progress Notes (Signed)
 Cardiology Office Note:  .   Date:  07/10/2024  ID:  CLAUDEAN LEAVELLE, DOB 1956/10/29, MRN [id number] PCP: Kennyth Worth HERO, MD  [facility] Providers Cardiologist:  Soyla DELENA Merck, MD Cardiology APP:  Emelia Josefa HERO, NP    History of Present Illness: [place]annah   ARMONII [name] is a 68 y.o. female.  Discussed the use of AI scribe software for clinical note transcription with the patient, who gave verbal consent to proceed.  History of Present Illness Savannah [name] is a 68 year old female with severe mitral valve regurgitation who presents for management of her condition.  Recently, she has noted increased shortness of breath, which has worsened since returning from a vacation three weeks ago. During this vacation, she consumed meals high in salt, which may have contributed to her current symptoms. Her blood pressure, previously stable, has been elevated since the vacation. She also has persistent leg swelling, which is currently the best it has been in a while.  She has a history of lymphedema, confirmed by specialists, and was advised against using Lasix  as it could counteract the lymphedema treatment. However, recent evaluations suggest cardiac involvement in her swelling, and she is considering diuretic therapy to manage fluid retention.  Her past medical history includes initially presenting in 2020 with chest pain with complex lesions in the LAD, which was managed with atorvastatin  and increased physical activity, leading to a reduction in chest pain but subsequent development of shortness of breath. She underwent a heart catheterization in 2023 with eventual stenting to the LAD. Moderate diffuse residual disease requires continued aggressive medical therapy.  She has a history of diabetes, previously managed with Invokana  and metoprolol , but she is now on insulin . Her blood pressure was previously low but has recently increased. She has a history of bariatric surgery, which led to a  temporary cessation of HCTZ and diabetic medications, but she resumed diabetic medications due to persistent diabetes.  She reports a recent flare-up of gout, which typically requires steroid treatment, and she is aware that this may cause fluid retention and elevated blood sugar levels. She also has a history of anemia, recently receiving an iron  infusion, which has improved her symptoms of craving ice and potentially her shortness of breath.  She is currently taking insulin  for diabetes management and has not taken Lasix  in over a year. She has never taken potassium supplements with it.    ROS: negative except per HPI above.  Studies Reviewed: .        Results LABS   Hemoglobin: 9.4 g/dL (91/72/7974) Risk Assessment/Calculations:       Physical Exam:   VS:  BP 132/60 (BP Location: Left Arm, Patient Position: Sitting, Cuff Size: Normal)   Pulse 62   Resp 16   Ht 5' 11 (1.803 m)   Wt 218 lb 8 oz (99.1 kg)   BMI 30.47 kg/m    Wt Readings from Last 3 Encounters:  07/10/24 218 lb 8 oz (99.1 kg)  07/08/24 219 lb 6.4 oz (99.5 kg)  07/02/24 215 lb (97.5 kg)     Physical Exam GENERAL: Alert, cooperative, well developed, no acute distress. HEENT: Normocephalic, normal oropharynx, moist mucous membranes. CHEST: Clear to auscultation bilaterally, no wheezes, rhonchi, or crackles. CARDIOVASCULAR: Normal heart rate and rhythm, S1 and S2 normal without murmurs. ABDOMEN: Soft, non-tender, non-distended, without organomegaly, normal bowel sounds. EXTREMITIES: 1-2+ LE edema NEUROLOGICAL: Cranial nerves grossly intact, moves all extremities without gross motor or sensory deficit.  ASSESSMENT AND PLAN: .    Assessment and Plan Assessment & Plan Severe mitral valve regurgitation with cardiac-related fluid overload Med mgmt Severe mitral valve regurgitation due to atrial dilation with increased severity. Fluid overload exacerbating symptoms. Prioritized blood pressure control and  fluid management. Surgical intervention deferred; medical management prioritized unless we cannot keep good control of symptoms or afib develops. - Initiate Lasix  20 mg daily for fluid management. - Monitor blood pressure daily. - Perform lab work in 7 days to assess kidney function and electrolytes. - Consider surgical intervention if medical management fails.  Lower extremity lymphedema with cardiac-related fluid overload Chronic lower extremity lymphedema exacerbated by cardiac-related fluid overload. Lasix  deemed appropriate despite previous lack of use. - Initiate Lasix  20 mg daily to manage fluid overload. - Monitor response to Lasix  and adjust treatment as necessary.  Hypertension Recent blood pressure increase likely due to dietary salt intake. Blood pressure control essential for managing mitral regurgitation. Potential use of HCTZ or spironolactone if Lasix  ineffective. - Monitor blood pressure daily. - Adjust antihypertensive therapy based on response to Lasix  and blood pressure readings.  Iron  deficiency anemia, status post recent iron  infusion Recent iron  infusion improved symptoms such as ice craving. Anemia likely contributing to shortness of breath. Improvement expected post-infusion. - Monitor symptoms post-iron  infusion for improvement in shortness of breath.      Soyla Merck, MD, FACC

## 2024-07-15 ENCOUNTER — Other Ambulatory Visit: Payer: Self-pay

## 2024-07-15 MED ORDER — PANCRELIPASE (LIP-PROT-AMYL) 36000-114000 UNITS PO CPEP: ORAL_CAPSULE | ORAL | 3 refills | Status: DC

## 2024-07-17 ENCOUNTER — Encounter: Payer: Self-pay | Admitting: "Endocrinology

## 2024-07-17 DIAGNOSIS — E1165 Type 2 diabetes mellitus with hyperglycemia: Secondary | ICD-10-CM

## 2024-07-17 MED ORDER — DEXCOM G7 SENSOR MISC: 1.0000 | 0 refills | Status: AC

## 2024-07-17 MED ORDER — INSULIN LISPRO (1 UNIT DIAL) 100 UNIT/ML (KWIKPEN): PEN_INJECTOR | SUBCUTANEOUS | 2 refills | Status: DC

## 2024-07-18 DIAGNOSIS — E890 Postprocedural hypothyroidism: Secondary | ICD-10-CM | POA: Diagnosis not present

## 2024-07-18 DIAGNOSIS — I1310 Hypertensive heart and chronic kidney disease without heart failure, with stage 1 through stage 4 chronic kidney disease, or unspecified chronic kidney disease: Secondary | ICD-10-CM | POA: Diagnosis not present

## 2024-07-18 DIAGNOSIS — R0789 Other chest pain: Secondary | ICD-10-CM | POA: Diagnosis not present

## 2024-07-18 DIAGNOSIS — Z7985 Long-term (current) use of injectable non-insulin antidiabetic drugs: Secondary | ICD-10-CM | POA: Diagnosis not present

## 2024-07-18 DIAGNOSIS — E785 Hyperlipidemia, unspecified: Secondary | ICD-10-CM | POA: Diagnosis not present

## 2024-07-18 DIAGNOSIS — N1832 Chronic kidney disease, stage 3b: Secondary | ICD-10-CM | POA: Diagnosis not present

## 2024-07-18 DIAGNOSIS — I2510 Atherosclerotic heart disease of native coronary artery without angina pectoris: Secondary | ICD-10-CM | POA: Diagnosis not present

## 2024-07-18 DIAGNOSIS — R079 Chest pain, unspecified: Secondary | ICD-10-CM | POA: Diagnosis not present

## 2024-07-18 DIAGNOSIS — E1165 Type 2 diabetes mellitus with hyperglycemia: Secondary | ICD-10-CM | POA: Diagnosis not present

## 2024-07-18 DIAGNOSIS — Z9884 Bariatric surgery status: Secondary | ICD-10-CM | POA: Diagnosis not present

## 2024-07-18 DIAGNOSIS — Z87891 Personal history of nicotine dependence: Secondary | ICD-10-CM | POA: Diagnosis not present

## 2024-07-18 DIAGNOSIS — M62838 Other muscle spasm: Secondary | ICD-10-CM | POA: Diagnosis not present

## 2024-07-18 DIAGNOSIS — Z7984 Long term (current) use of oral hypoglycemic drugs: Secondary | ICD-10-CM | POA: Diagnosis not present

## 2024-07-18 DIAGNOSIS — Z79899 Other long term (current) drug therapy: Secondary | ICD-10-CM | POA: Diagnosis not present

## 2024-07-18 DIAGNOSIS — K219 Gastro-esophageal reflux disease without esophagitis: Secondary | ICD-10-CM | POA: Diagnosis not present

## 2024-07-18 DIAGNOSIS — Z79890 Hormone replacement therapy (postmenopausal): Secondary | ICD-10-CM | POA: Diagnosis not present

## 2024-07-18 DIAGNOSIS — Z8542 Personal history of malignant neoplasm of other parts of uterus: Secondary | ICD-10-CM | POA: Diagnosis not present

## 2024-07-18 DIAGNOSIS — Z8679 Personal history of other diseases of the circulatory system: Secondary | ICD-10-CM | POA: Diagnosis not present

## 2024-07-18 DIAGNOSIS — R600 Localized edema: Secondary | ICD-10-CM | POA: Diagnosis not present

## 2024-07-18 DIAGNOSIS — Z794 Long term (current) use of insulin: Secondary | ICD-10-CM | POA: Diagnosis not present

## 2024-07-18 DIAGNOSIS — I081 Rheumatic disorders of both mitral and tricuspid valves: Secondary | ICD-10-CM | POA: Diagnosis not present

## 2024-07-18 DIAGNOSIS — E1122 Type 2 diabetes mellitus with diabetic chronic kidney disease: Secondary | ICD-10-CM | POA: Diagnosis not present

## 2024-07-19 ENCOUNTER — Telehealth: Payer: Self-pay | Admitting: Student

## 2024-07-19 DIAGNOSIS — R079 Chest pain, unspecified: Secondary | ICD-10-CM | POA: Diagnosis not present

## 2024-07-19 NOTE — Telephone Encounter (Signed)
 Patient contacted the after-hours answering service.  She was currently hospitalized at [facility] for chest pain.  Reported that the team at [facility] were planning to do a cardiac MRI and the patient was hesitant to proceed.  Recommended that if the team at [facility] were considering a cardiac MRI then it would be reasonable to proceed.  Advised the patient that it was her choice whether or not she wanted to leave the hospital.  The patient had a follow-up with Dr. Loni on 9/22 the patient requested an earlier appointment scheduled her for follow-up with Dr. Loni on 9/11. Will forward this to Dr Loni  Signed,  Morse Clause, PA-C 07/19/2024, 11:42 AM

## 2024-07-20 ENCOUNTER — Encounter: Payer: Self-pay | Admitting: Internal Medicine

## 2024-07-20 ENCOUNTER — Other Ambulatory Visit: Payer: Self-pay | Admitting: Family Medicine

## 2024-07-20 DIAGNOSIS — J309 Allergic rhinitis, unspecified: Secondary | ICD-10-CM

## 2024-07-20 DIAGNOSIS — R079 Chest pain, unspecified: Secondary | ICD-10-CM | POA: Diagnosis not present

## 2024-07-21 ENCOUNTER — Other Ambulatory Visit: Payer: Self-pay | Admitting: Family Medicine

## 2024-07-21 ENCOUNTER — Other Ambulatory Visit: Payer: Self-pay

## 2024-07-22 DIAGNOSIS — R600 Localized edema: Secondary | ICD-10-CM | POA: Diagnosis not present

## 2024-07-22 DIAGNOSIS — I890 Lymphedema, not elsewhere classified: Secondary | ICD-10-CM | POA: Diagnosis not present

## 2024-07-22 MED ORDER — ATORVASTATIN CALCIUM 40 MG PO TABS: 40.0000 mg | ORAL_TABLET | Freq: Every day | ORAL | 3 refills | Status: AC

## 2024-07-23 ENCOUNTER — Encounter: Payer: Self-pay | Admitting: Internal Medicine

## 2024-07-23 ENCOUNTER — Ambulatory Visit: Attending: Internal Medicine | Admitting: Internal Medicine

## 2024-07-23 VITALS — BP 110/64 | HR 81 | Ht 71.0 in | Wt 211.6 lb

## 2024-07-23 DIAGNOSIS — R0600 Dyspnea, unspecified: Secondary | ICD-10-CM | POA: Diagnosis not present

## 2024-07-23 DIAGNOSIS — E785 Hyperlipidemia, unspecified: Secondary | ICD-10-CM | POA: Diagnosis not present

## 2024-07-23 DIAGNOSIS — I2510 Atherosclerotic heart disease of native coronary artery without angina pectoris: Secondary | ICD-10-CM | POA: Insufficient documentation

## 2024-07-23 DIAGNOSIS — Z79899 Other long term (current) drug therapy: Secondary | ICD-10-CM | POA: Diagnosis not present

## 2024-07-23 DIAGNOSIS — I5032 Chronic diastolic (congestive) heart failure: Secondary | ICD-10-CM | POA: Diagnosis not present

## 2024-07-23 DIAGNOSIS — I340 Nonrheumatic mitral (valve) insufficiency: Secondary | ICD-10-CM | POA: Insufficient documentation

## 2024-07-23 DIAGNOSIS — Z955 Presence of coronary angioplasty implant and graft: Secondary | ICD-10-CM | POA: Diagnosis not present

## 2024-07-23 MED ORDER — FUROSEMIDE 20 MG PO TABS: 40.0000 mg | ORAL_TABLET | Freq: Every day | ORAL | Status: DC

## 2024-07-23 MED ORDER — NITROGLYCERIN 0.4 MG SL SUBL: 0.4000 mg | SUBLINGUAL_TABLET | SUBLINGUAL | 4 refills | Status: AC | PRN

## 2024-07-23 MED ORDER — NITROGLYCERIN 0.4 MG SL SUBL: 0.4000 mg | SUBLINGUAL_TABLET | SUBLINGUAL | 4 refills | Status: DC | PRN

## 2024-07-23 NOTE — Progress Notes (Deleted)
  Cardiology Office Note:  .   Date:  07/23/2024  ID:  Savannah [name], DOB July 25, 1956, MRN [id number] PCP: Kennyth Worth HERO, MD  [facility] Providers Cardiologist:  Soyla DELENA Merck, MD Cardiology APP:  Emelia Josefa HERO, NP    History of Present Illness: Savannah   KYASIA [name] is a 68 y.o. female.  Discussed the use of AI scribe software for clinical note transcription with the patient, who gave verbal consent to proceed.  History of Present Illness     ROS: negative except per HPI above.  Studies Reviewed: .        Results  Risk Assessment/Calculations:   {Does this patient have ATRIAL FIBRILLATION?:[id number]}   Physical Exam:   VS:  BP 110/64 (BP Location: Left Arm, Patient Position: Sitting, Cuff Size: Large)   Pulse 81   Ht 5' 11 (1.803 m)   Wt 211 lb 9.6 oz (96 kg)   SpO2 95%   BMI 29.51 kg/m    Wt Readings from Last 3 Encounters:  07/23/24 211 lb 9.6 oz (96 kg)  07/10/24 218 lb 8 oz (99.1 kg)  07/08/24 219 lb 6.4 oz (99.5 kg)     Physical Exam    ASSESSMENT AND PLAN: .    Assessment and Plan Assessment & Plan       Soyla Merck, MD, FACC

## 2024-07-23 NOTE — Patient Instructions (Addendum)
 Medication Instructions:    Increase Lasix  to 40 mg  daily  ( 2 tablets of 20 mg)   *If you need a refill on your cardiac medications before your next appointment, please call your pharmacy*   Lab Work: BMP  in 5 to 7 days  If you have labs (blood work) drawn today and your tests are completely normal, you will receive your results only by: MyChart Message (if you have MyChart) OR A paper copy in the mail If you have any lab test that is abnormal or we need to change your treatment, we will call you to review the results.   Testing/Procedures:  Not needed  Follow-Up: At [facility], you and your health needs are our priority.  As part of our continuing mission to provide you with exceptional heart care, we have created designated Provider Care Teams.  These Care Teams include your primary Cardiologist (physician) and Advanced Practice Providers (APPs -  Physician Assistants and Nurse Practitioners) who all work together to provide you with the care you need, when you need it.     Your next appointment:   3 month(s)  The format for your next appointment:   In Person  Provider:   Soyla DELENA Merck, MD  You have been referred to Structural Heart team -- severe mitral regurgitation   Other Instructions

## 2024-07-23 NOTE — Progress Notes (Signed)
 Cardiology Office Note:  .   Date:  07/23/2024  ID:  Savannah [name], DOB 1956/09/10, MRN [id number] PCP: Savannah Worth HERO, MD  [name] Providers Cardiologist:  Savannah DELENA Merck, MD Cardiology APP:  Savannah Josefa HERO, NP    History of Present Illness: [place]   [name] is a 68 y.o. female.  Discussed the use of AI scribe software for clinical note transcription with the patient, who gave verbal consent to proceed.  History of Present Illness Savannah [name] is a 68 year old female with coronary artery disease and mitral valve regurgitation who presents with chest pain.  She experienced chest and jaw pain at a conference, similar to previous episodes but lasting longer. The pain resolved after nitroglycerin  and four baby aspirins. A nuclear stress test and troponin levels were normal. She has coronary artery disease with previous stent placements and severe mitral valve regurgitation. Shortness of breath worsened the day before the visit despite daily Lasix , which has reduced ankle swelling but not significantly improved dyspnea. Blood pressure has stabilized at home to around 110/65 post-Lasix . She has congestive heart failure, with symptoms managed by Lasix . Leg cramps are managed with magnesium supplements.    ROS: negative except per HPI above.  Studies Reviewed: .        Results LABS Troponin: Normal  DIAGNOSTIC Nuclear stress test: Normal Electrocardiogram (EKG): Normal Risk Assessment/Calculations:       Physical Exam:   VS:  BP 110/64 (BP Location: Left Arm, Patient Position: Sitting, Cuff Size: Large)   Pulse 81   Ht 5' 11 (1.803 m)   Wt 211 lb 9.6 oz (96 kg)   SpO2 95%   BMI 29.51 kg/m    Wt Readings from Last 3 Encounters:  07/27/24 206 lb 6.4 oz (93.6 kg)  07/23/24 211 lb 9.6 oz (96 kg)  07/10/24 218 lb 8 oz (99.1 kg)     Physical Exam GENERAL: Alert, cooperative, well developed, no acute distress HEENT: Normocephalic, normal oropharynx, moist  mucous membranes CHEST: Clear to auscultation bilaterally, no wheezes, rhonchi, or crackles CARDIOVASCULAR: Loud murmur auscultated, normal heart rate and rhythm, S1 and S2 normal ABDOMEN: Soft, non-tender, non-distended, without organomegaly, normal bowel sounds EXTREMITIES: No cyanosis or edema NEUROLOGICAL: Cranial nerves grossly intact, moves all extremities without gross motor or sensory deficit   ASSESSMENT AND PLAN: .    Assessment and Plan Assessment & Plan Severe nonrheumatic mitral valve regurgitation with symptomatic chronic diastolic heart failure Severe mitral valve regurgitation with chronic diastolic heart failure. Symptoms persist despite Lasix . Chest pain likely due to mitral regurgitation and hypertension, not coronary artery disease. Mitral valve clip evaluation needed. Discussed concerns about open-heart surgery and blood transfusions. - Increase Lasix  to 40 mg daily. - Refer to structural heart team for mitral valve clip evaluation vs surgical evaluation. - Order heart catheterization to assess coronary stability and get RHC. - Continue potassium supplementation.  Coronary artery disease, status post coronary stents Coronary artery disease with previous stents. Recent chest pain resolved with nitroglycerin  and aspirin . Normal troponin and stress test suggest no acute coronary syndrome. Pain likely related to mitral regurgitation and hypertension. - Order heart catheterization to assess coronary stability and surgical planning - Prescribe nitroglycerin  for acute chest pain. - Advise emergency care if chest pain recurs.  Hypertension Hypertension with recent elevated episodes, particularly during chest pain. More stable with Lasix , but further control needed for mitral regurgitation management. - Increase Lasix  to 40 mg daily. - Monitor  blood pressure regularly.  Lymphedema, lower extremity Lower extremity lymphedema with some improvement on Lasix . Stockings ordered,  further treatment deferred until cardiac status clarified. - Continue Lasix  for edema management. - Use compression stockings as ordered.  Chronic kidney disease, baseline mild impairment Chronic kidney disease with baseline mild impairment. Creatinine at 1.4, baseline by nephrology. Monitoring necessary with increased Lasix . - Monitor kidney function with labs after one week of increased Lasix . - Continue current management and monitoring.      Savannah Merck, MD, FACC

## 2024-07-24 ENCOUNTER — Telehealth: Payer: Self-pay

## 2024-07-24 NOTE — Telephone Encounter (Signed)
 Comments: Restricted and short (0.8 cm) posterior leaflet. Centrally directed jet originating from A2/P2. Suggest 1 PASCAL at central A2/P2. TSP appears low (3.8 cm), MVA is 5.6cm2, and starting gradient is .

## 2024-07-25 NOTE — Progress Notes (Addendum)
 Patient ID: Savannah [name] MRN: [id number] DOB/AGE: December 04, 1955 68 y.o.  Primary Care Physician:Parker, Worth HERO, MD Primary Cardiologist:  Loni  CC:  Mitral valvular disease management     FOCUSED PROBLEM LIST:   Mitral regurgitation Severe, atrial functional, EF 60-65% TEE August 2025 Tricuspid regurgitation Moderate, TEE August 2025 CAD PCI proximal LAD 2023 Low risk Lexiscan 2025 [facility] T2DM On insulin  Hypertension Hyperlipidemia Aortic atherosclerosis CT abdomen pelvis 2022 CKD stage IIIb Uterine cancer s/p hysterectomy with extensive lymph node dissection 1997 C/b lymphedema BMI 10 August 2024:  Patient consents to use of AI scribe. The patient is a 68 year old female with above listed medical problems referred for recommendations regarding her severe atrial functional mitral regurgitation.  She was seen by Dr. Loni in August of this year.  She noted increasing shortness of breath.  A TEE was performed which demonstrated severe atrial functional mitral regurgitation.  She is here for further recommendations.  She has been experiencing worsening shortness of breath since April or May 2025, which has progressively affected her daily activities such as singing at church, walking, climbing stairs, carrying groceries, and vacuuming. She experiences shortness of breath even while talking and sometimes while sitting. She also reports orthopnea, which usually resolves after some time. Lasix  has helped reduce leg swelling, but significant shortness of breath persists.  She experiences chest and neck pain, notably during a church event in [place] a week ago Saturday, and had a brief episode of chest pain a few days after the TEE, which resolved within four to five minutes.  She has a history of lymphedema, attributed to a hysterectomy and lymph node removal in 1997 due to cancer. The swelling has worsened over the years, particularly in her left leg. She has not received  lymphedema treatments since last summer. She uses compression stockings and is awaiting a lymphedema pump.  She has a history of PVCs since childhood and occasionally experiences palpitations. She avoids caffeine, which previously exacerbated her symptoms. She has not experienced any blacking out spells in recent years.  She has experienced significant weight gain, approximately 50 pounds in the last year, which she attributes to starting insulin  therapy. She underwent bariatric surgery many years ago and has lost about 10 pounds since starting Lasix .       Past Medical History:  Diagnosis Date   ABDOMINAL PAIN, CHRONIC 10/20/2009   Acute cystitis 08/17/2009   ALLERGIC RHINITIS 11/21/2007   Allergy     ASYMPTOMATIC POSTMENOPAUSAL STATUS 09/29/2008   Bariatric surgery status 07/07/2010   Bariatric surgery status 07/07/2010   Qualifier: Diagnosis of  By: Kassie MD, Sean A    Cancer [facility])    uterine   Cataract    Coronary artery disease    DEPRESSION 11/21/2007   DIABETES MELLITUS, TYPE II 07/08/2007   DYSPNEA 05/20/2009   Edema 05/20/2009   Eosinophilic esophagitis 02/13/2008   FEVER UNSPECIFIED 10/20/2009   FEVER, HX OF 03/09/2010   GERD (gastroesophageal reflux disease)    Headache(784.0) 02/06/2010   Hepatomegaly 01/06/2008   HYPERLIPIDEMIA 07/08/2007   HYPERTENSION 07/08/2007   Kidney disease    followed by nephrology, per patient   LEUKOPENIA, MILD 09/29/2008   Lymphedema    left leg, dx approximately in 03/2023 per patient   Melanoma in situ of left upper arm (HCC) 04/03/2023   OBESITY 01/06/2008   OSTEOARTHRITIS 01/06/2008   Other chronic nonalcoholic liver disease 01/06/2008   PERIPHERAL NEUROPATHY 11/21/2007   Peripheral neuropathy    Postsurgical  hypothyroidism 09/19/2010   SINUSITIS- ACUTE-NOS 11/21/2007   TB SKIN TEST, POSITIVE 02/06/2010   THYROID  NODULE 03/09/2010    Past Surgical History:  Procedure Laterality Date   ABDOMINAL HYSTERECTOMY     BASAL  CELL CARCINOMA EXCISION     CARDIAC CATHETERIZATION     CHOLECYSTECTOMY N/A 05/11/2021   Procedure: LAPAROSCOPIC CHOLECYSTECTOMY WITH POSSIBLE  INTRAOPERATIVE CHOLANGIOGRAM;  Surgeon: Gladis Cough, MD;  Location: [facility];  Service: General;  Laterality: N/A;   COLONOSCOPY     CORONARY ATHERECTOMY N/A 05/03/2022   Procedure: CORONARY ATHERECTOMY;  Surgeon: Wendel Lurena POUR, MD;  Location: [facility];  Service: Cardiovascular;  Laterality: N/A;   CORONARY IMAGING/OCT N/A 05/03/2022   Procedure: INTRAVASCULAR IMAGING/OCT;  Surgeon: Wendel Lurena POUR, MD;  Location: [facility];  Service: Cardiovascular;  Laterality: N/A;   CORONARY PRESSURE/FFR STUDY N/A 05/03/2022   Procedure: INTRAVASCULAR PRESSURE WIRE/FFR STUDY;  Surgeon: Wendel Lurena POUR, MD;  Location: [facility];  Service: Cardiovascular;  Laterality: N/A;   CORONARY STENT INTERVENTION N/A 05/03/2022   Procedure: CORONARY STENT INTERVENTION;  Surgeon: Wendel Lurena POUR, MD;  Location: [facility];  Service: Cardiovascular;  Laterality: N/A;  lad   EYE SURGERY Bilateral 08/2018, 09/2018   FOOT SURGERY Left    10/2019   KNEE ARTHROSCOPY Left    LEFT HEART CATH AND CORONARY ANGIOGRAPHY N/A 01/20/2019   Procedure: LEFT HEART CATH AND CORONARY ANGIOGRAPHY;  Surgeon: Claudene Victory ORN, MD;  Location: [facility];  Service: Cardiovascular;  Laterality: N/A;   LYMPH NODE DISSECTION     OOPHORECTOMY     Right total knee replacement     RIGHT/LEFT HEART CATH AND CORONARY ANGIOGRAPHY N/A 05/03/2022   Procedure: RIGHT/LEFT HEART CATH AND CORONARY ANGIOGRAPHY;  Surgeon: Wendel Lurena POUR, MD;  [facility]: [facility];  Service: Cardiovascular;  Laterality: N/A;   ROUX-EN-Y GASTRIC BYPASS  2011   SKIN TAG REMOVAL     TEE WITH CARDIOVERSION     THYROIDECTOMY     TONSILLECTOMY AND ADENOIDECTOMY     TOOTH EXTRACTION  2023   TRANSESOPHAGEAL ECHOCARDIOGRAM (CATH LAB) N/A 07/06/2024   Procedure: TRANSESOPHAGEAL  ECHOCARDIOGRAM;  Surgeon: Loni Soyla LABOR, MD;  Location: [facility];  Service: Cardiovascular;  Laterality: N/A;   UPPER GASTROINTESTINAL ENDOSCOPY     UPPER GI ENDOSCOPY  2024    Family History  Problem Relation Age of Onset   Diabetes Mother    Neuropathy Mother    Diabetes Father    Congestive Heart Failure Father    Hypertension Father    Myelodysplastic syndrome Father    Bladder Cancer Sister    Multiple sclerosis Sister    Breast cancer Sister        breast onset age 74   Osteoporosis Sister    Liver cancer Maternal Aunt    Breast cancer Maternal Aunt    Diabetes Maternal Aunt    Colon cancer Maternal Uncle    Diabetes Maternal Uncle    Neuropathy Paternal Aunt    Diabetes Maternal Grandmother    Diabetes Paternal Grandmother     Social History   Socioeconomic History   Marital status: Single    Spouse name: Not on file   Number of children: 0   Years of education: 16   Highest education level: Bachelor's degree (e.g., BA, AB, BS)  Occupational History   Occupation: Research scientist (medical)   Occupation: Retired  Tobacco Use   Smoking status:  Former    Current packs/day: 0.00    Average packs/day: 1.5 packs/day for 15.0 years (22.5 ttl pk-yrs)    Types: Cigarettes    Start date: 42    Quit date: 27    Years since quitting: 33.7    Passive exposure: Never   Smokeless tobacco: Never  Vaping Use   Vaping status: Never Used  Substance and Sexual Activity   Alcohol use: Yes    Comment: rarely   Drug use: Never   Sexual activity: Not Currently  Other Topics Concern   Not on file  Social History Narrative   Not on file   Social Drivers of Health   Financial Resource Strain: Low Risk  (05/06/2024)   Overall Financial Resource Strain (CARDIA)    Difficulty of Paying Living Expenses: Not very hard  Food Insecurity: Low Risk  (07/19/2024)   Received from [facility]   Hunger Vital Sign    Within the past 12 months, you worried that your food would run  out before you got money to buy more: Never true    Within the past 12 months, the food you bought just didn't last and you didn't have money to get more. : Never true  Transportation Needs: No Transportation Needs (07/19/2024)   Received from [facility]    In the past 12 months, has lack of reliable transportation kept you from medical appointments, meetings, work or from getting things needed for daily living? : No  Physical Activity: Inactive (05/06/2024)   Exercise Vital Sign    Days of Exercise per Week: 0 days    Minutes of Exercise per Session: Not on file  Stress: No Stress Concern Present (05/06/2024)   [employer] of Occupational Health - Occupational Stress Questionnaire    Feeling of Stress: Only a little  Social Connections: Moderately Integrated (05/06/2024)   Social Connection and Isolation Panel    Frequency of Communication with Friends and Family: More than three times a week    Frequency of Social Gatherings with Friends and Family: Three times a week    Attends Religious Services: More than 4 times per year    Active Member of Clubs or Organizations: Yes    Attends Banker Meetings: More than 4 times per year    Marital Status: Never married  Intimate Partner Violence: Not At Risk (05/19/2020)   Humiliation, Afraid, Rape, and Kick questionnaire    Fear of Current or Ex-Partner: No    Emotionally Abused: No    Physically Abused: No    Sexually Abused: No     Prior to Admission medications   Medication Sig Start Date End Date Taking? Authorizing Provider  acetaminophen  (TYLENOL ) 500 MG tablet Take 2 tablets (1,000 mg total) by mouth every 6 (six) hours as needed for mild pain or fever. Patient not taking: Reported on 07/23/2024 05/13/21   Rai, Nydia POUR, MD  atorvastatin  (LIPITOR) 40 MG tablet Take 1 tablet (40 mg total) by mouth daily. 07/22/24   Acharya, Gayatri A, MD  blood glucose meter kit and supplies KIT Use up to three  times  daily as directed. 08/27/23   Kennyth Worth HERO, MD  Blood Glucose Monitoring Suppl DEVI 1 each by Does not apply route in the morning, at noon, and at bedtime. May substitute to any manufacturer covered by patient's insurance. 10/29/23   Motwani, Komal, MD  Cholecalciferol (VITAMIN D ) 50 MCG (2000 UT) tablet Take 4,000 Units by mouth in  the morning.    [provider]  clopidogrel  (PLAVIX ) 75 MG tablet Take 1 tablet (75 mg total) by mouth daily. 02/17/24   Acharya, Gayatri A, MD  Colchicine  (MITIGARE ) 0.6 MG CAPS Take 0.6 mg by mouth as needed (gout pain). Patient not taking: Reported on 07/23/2024    [provider]  Continuous Glucose Sensor (DEXCOM G7 SENSOR) MISC 1 Device by Does not apply route See admin instructions. 07/17/24   Motwani, Komal, MD  cyclobenzaprine  (FLEXERIL ) 10 MG tablet Take 10 mg by mouth at bedtime as needed for muscle spasms. Patient not taking: Reported on 07/23/2024 07/06/20   [provider]  diclofenac  sodium (VOLTAREN ) 1 % GEL Apply 4 g topically 4 (four) times daily. 09/06/17   Kassie Mallick, MD  Dulaglutide  (TRULICITY ) 4.5 MG/0.5ML SOAJ Inject 4.5 mg as directed once a week. 06/22/24   Motwani, Komal, MD  fluconazole  (DIFLUCAN ) 200 MG tablet Take 200 mg by mouth as needed (yeast).    [provider]  furosemide  (LASIX ) 20 MG tablet Take 2 tablets (40 mg total) by mouth daily. 07/23/24   Acharya, Gayatri A, MD  gabapentin  (NEURONTIN ) 600 MG tablet Take 600 mg by mouth 2 (two) times daily. 03/22/24   [provider]  Glucagon  (BAQSIMI  ONE PACK) 3 MG/DOSE POWD Place 1 Device into the nose as needed (Low blood sugar with impaired consciousness). Patient not taking: Reported on 07/23/2024 10/17/23   Motwani, Komal, MD  Halobetasol & Lactic Acid (ULTRAVATE X, OINTMENT, EX) Apply 1 Application topically 2 (two) times daily.    [provider]  halobetasol (ULTRAVATE) 0.05 % ointment Apply 1 Application topically 3 (three) times a  week. 05/19/23   [provider]  HYDROcodone -acetaminophen  (NORCO/VICODIN) 5-325 MG tablet Take 1 tablet by mouth as needed for moderate pain (pain score 4-6).    [provider]  insulin  glargine (LANTUS  SOLOSTAR) 100 UNIT/ML Solostar Pen Inject 25 Units into the skin daily. 06/26/24   Motwani, Komal, MD  insulin  lispro (HUMALOG ) 100 UNIT/ML KwikPen Inject 15 units with break fast and 15 units with lunch, 20-22 with supper 15 min before meals 07/17/24   Motwani, Obadiah, MD  Insulin  Pen Needle (BD PEN NEEDLE MINI ULTRAFINE) 31G X 5 MM MISC Use as directed with insulin . 04/23/24   Motwani, Obadiah, MD  levalbuterol  (XOPENEX  HFA) 45 MCG/ACT inhaler Inhale 1 puff into the lungs every 4 (four) hours as needed for wheezing. Patient not taking: Reported on 07/10/2024 05/10/22   Walker, Caitlin S, NP  levocetirizine (XYZAL ) 5 MG tablet TAKE 1 TABLET EVERY EVENING 07/20/24   Kennyth Worth HERO, MD  levothyroxine  (SYNTHROID ) 125 MCG tablet Take 1 tablet (125 mcg total) by mouth daily. 04/15/24   Motwani, Komal, MD  lidocaine  (LIDODERM ) 5 % PLACE 1 PATCH ONTO THE SKIN DAILY. REMOVE & DISCARD PATCH WITHIN 12 HOURS OR AS DIRECTED BY MD 01/15/23   Cheryl Waddell HERO, PA-C  lipase/protease/amylase (CREON ) 36000 UNITS CPEP capsule Take 2 capsules by mouth with meals and 1 with snack. 07/15/24   [place], PA-C  MAGNESIUM PO Take 3 tablets by mouth daily as needed (foot cramps).    [provider]  methocarbamol (ROBAXIN) 750 MG tablet Take 750 mg by mouth every 6 (six) hours as needed. Patient not taking: Reported on 07/23/2024 08/12/23   [provider]  Multiple Vitamin (MULTIVITAMIN) tablet Take 1 tablet by mouth in the morning and at bedtime.    [provider]  nitroGLYCERIN  (NITROSTAT ) 0.4 MG  SL tablet Place 1 tablet (0.4 mg total) under the tongue every 5 (five) minutes as needed for chest pain. 07/23/24   Acharya, Gayatri A, MD  pantoprazole  (PROTONIX ) 40 MG tablet TAKE 1 TABLET TWICE A  DAY 07/21/24   Kennyth Worth HERO, MD  phenazopyridine  (PYRIDIUM ) 100 MG tablet Take 1 tablet (100 mg total) by mouth 3 (three) times daily with meals. Patient taking differently: Take 100 mg by mouth 3 (three) times daily as needed for pain. 09/26/23   Nche, Roselie Rockford, NP  potassium chloride  SA (KLOR-CON  M) 20 MEQ tablet Take 1 tablet (20 mEq total) by mouth daily. 07/10/24   Acharya, Gayatri A, MD  sucralfate  (CARAFATE ) 1 GM/10ML suspension Take 10 mLs (1 g total) by mouth 4 (four) times daily as needed. Patient not taking: Reported on 07/10/2024 07/06/24   Acharya, Gayatri A, MD  traMADol  (ULTRAM ) 50 MG tablet Take 50 mg by mouth every 6 (six) hours as needed (pain). Patient not taking: Reported on 07/23/2024    [provider]  triamcinolone cream (KENALOG) 0.1 % Apply 1 Application topically 2 (two) times daily. 06/25/24   [provider]    Allergies  Allergen Reactions   Ace Inhibitors Cough   Caffeine     PVCs   Ciprofloxacin Itching   Morphine  And Codeine Itching    Pt tolerates medication if given with diphenhydramine    Mucinex [Guaifenesin Er]     PVCs   Nsaids     Gastric Bypass Surgery - unable to take, tolerates ec 81 aspirin     Other     Antihistamine-alkylamine - PVCs tolerates benadryl     Silicone Hives   Tape Hives    REVIEW OF SYSTEMS:  General: no fevers/chills/night sweats Eyes: no blurry vision, diplopia, or amaurosis ENT: no sore throat or hearing loss Resp: no cough, wheezing, or hemoptysis CV: no edema or palpitations GI: no abdominal pain, nausea, vomiting, diarrhea, or constipation GU: no dysuria, frequency, or hematuria Skin: no rash Neuro: no headache, numbness, tingling, or weakness of extremities Musculoskeletal: no joint pain or swelling Heme: no bleeding, DVT, or easy bruising Endo: no polydipsia or polyuria  BP 119/73   Pulse 80   Ht 5' 11 (1.803 m)   Wt 206 lb 6.4 oz (93.6 kg)   SpO2 98%   BMI 28.79 kg/m   PHYSICAL  EXAM: GEN:  AO x 3 in no acute distress HEENT: normal Dentition: Normal Neck: JVP normal. +2 carotid upstrokes without bruits. No thyromegaly. Lungs: equal expansion, clear bilaterally CV: Apex is discrete and nondisplaced, RRR with very soft systolic murmur at axilla Abd: soft, non-tender, non-distended; no bruit; positive bowel sounds Ext: +3 left lower edema, +2 right lower edema Vascular: 2+ femoral pulses, 2+ radial pulses       Skin: warm and dry without rash Neuro: CN II-XII grossly intact; motor and sensory grossly intact    DATA AND STUDIES:  EKG:  EKG Interpretation Date/Time:  Monday July 27 2024 09:58:55 EDT Ventricular Rate:  76 PR Interval:  134 QRS Duration:  90 QT Interval:  384 QTC Calculation: 432 R Axis:   -3  Text Interpretation: Normal sinus rhythm Normal ECG When compared with ECG of 01-Jul-2024 11:20, No significant change was found Confirmed by Wendel Haws ([id number]) on 07/27/2024 10:06:04 AM        CARDIAC STUDIES: Refer to CV Procedures and Imaging Tabs  04/09/2024: TSH 8.25 05/07/2024: ALT 75 06/29/2024: Platelet Count 182 07/06/2024: BUN 15; Creatinine, Ser 1.30;  Hemoglobin 9.5; Potassium 4.3; Sodium 142   STS RISK CALCULATOR: Pending  NHYA CLASS: 2     ASSESSMENT AND PLAN:   1. Nonrheumatic mitral valve regurgitation   2. Nonrheumatic tricuspid valve regurgitation   3. Coronary artery disease involving native coronary artery of native heart without angina pectoris   4. Type 2 diabetes mellitus with complication, with long-term current use of insulin  (HCC)   5. Hypertension associated with diabetes (HCC)   6. Aortic atherosclerosis (HCC)   7. CKD stage 3 due to type 2 diabetes mellitus (HCC)   8. BMI 29.0-29.9,adult   9. Pre-operative laboratory examination     Mitral regurgitation: Prior reviewed with the patient has a very large left atrium and severe mitral regurgitation consistent with atrial functional mitral regurgitation.   This could be potentially treated with mitral transcatheter edge-to-edge repair.  However in this relatively young patient with tricuspid regurgitation as well I think she needs to be considered for multiple surgical valve repair.  Certainly if she was judged not to be a candidate for surgery then we could pursue a mitral transcatheter edge-to-edge repair in isolation.  We would then monitor her TR and if it worsened we could consider a tricuspid edge-to-edge repair in the future.  Again in this relatively young patient I think surgery should be considered.  I will refer her for coronary angiography and right heart catheterization.  Will then refer her for surgical evaluation.  I will review her imaging in our structural heart disease meeting after her coronary angiography and right heart catheterization have been performed. Tricuspid regurgitation: Moderate on TEE.  If his surgery is to be performed for the mitral valve then I think I tricuspid repair should also be considered.  See discussion above if mitral transcatheter edge-to-edge repair is to be pursued. Coronary artery disease: Status post complex PCI of proximal to mid LAD in 2023 continue Plavix  75 mg indefinitely T2DM: Continue Plavix  75, atorvastatin  40, and consider ARB and SGLT2 inhibitor Hypertension: Blood pressure is well-controlled. Aortic atherosclerosis: Continue Plavix  75, atorvastatin  40 CKD stage III: Consider ARB, SGLT2 inhibitor Elevated BMI: Consider referral to pharmacy for GLP-1 receptor agonist therapy  I have personally reviewed the patients imaging data as summarized above.  I have reviewed the natural history of mitral regurgitation with the patient and family members who are present today. We have discussed the limitations of medical therapy and the poor prognosis associated with symptomatic mitral regurgitation. We have also reviewed potential treatment options, including palliative medical therapy, conventional mitral  surgery, and transcatheter mitral edge-to-edge repair. We discussed treatment options in the context of this patient's specific comorbid medical conditions.   All of the patient's questions were answered today. Will make further recommendations based on the results of studies outlined above.   I spent 50 minutes reviewing all clinical data during and prior to this visit including all relevant imaging studies, laboratories, clinical information from other health systems and prior notes from both Cardiology and other specialties, interviewing the patient, conducting a complete physical examination, and coordinating care in order to formulate a comprehensive and personalized evaluation and treatment plan.   Ariele Vidrio K Reagyn Facemire, MD  07/27/2024 10:40 AM    [facility] [address] [place], [place], [place]  [address] Phone: [phone]; Fax: [phone]

## 2024-07-25 NOTE — H&P (View-Only) (Signed)
 Patient ID: Savannah [name] MRN: [id number] DOB/AGE: December 04, 1955 68 y.o.  Primary Care Physician:Parker, Worth HERO, MD Primary Cardiologist:  Loni  CC:  Mitral valvular disease management     FOCUSED PROBLEM LIST:   Mitral regurgitation Severe, atrial functional, EF 60-65% TEE August 2025 Tricuspid regurgitation Moderate, TEE August 2025 CAD PCI proximal LAD 2023 Low risk Lexiscan 2025 [facility] T2DM On insulin  Hypertension Hyperlipidemia Aortic atherosclerosis CT abdomen pelvis 2022 CKD stage IIIb Uterine cancer s/p hysterectomy with extensive lymph node dissection 1997 C/b lymphedema BMI 10 August 2024:  Patient consents to use of AI scribe. The patient is a 68 year old female with above listed medical problems referred for recommendations regarding her severe atrial functional mitral regurgitation.  She was seen by Dr. Loni in August of this year.  She noted increasing shortness of breath.  A TEE was performed which demonstrated severe atrial functional mitral regurgitation.  She is here for further recommendations.  She has been experiencing worsening shortness of breath since April or May 2025, which has progressively affected her daily activities such as singing at church, walking, climbing stairs, carrying groceries, and vacuuming. She experiences shortness of breath even while talking and sometimes while sitting. She also reports orthopnea, which usually resolves after some time. Lasix  has helped reduce leg swelling, but significant shortness of breath persists.  She experiences chest and neck pain, notably during a church event in [place] a week ago Saturday, and had a brief episode of chest pain a few days after the TEE, which resolved within four to five minutes.  She has a history of lymphedema, attributed to a hysterectomy and lymph node removal in 1997 due to cancer. The swelling has worsened over the years, particularly in her left leg. She has not received  lymphedema treatments since last summer. She uses compression stockings and is awaiting a lymphedema pump.  She has a history of PVCs since childhood and occasionally experiences palpitations. She avoids caffeine, which previously exacerbated her symptoms. She has not experienced any blacking out spells in recent years.  She has experienced significant weight gain, approximately 50 pounds in the last year, which she attributes to starting insulin  therapy. She underwent bariatric surgery many years ago and has lost about 10 pounds since starting Lasix .       Past Medical History:  Diagnosis Date   ABDOMINAL PAIN, CHRONIC 10/20/2009   Acute cystitis 08/17/2009   ALLERGIC RHINITIS 11/21/2007   Allergy     ASYMPTOMATIC POSTMENOPAUSAL STATUS 09/29/2008   Bariatric surgery status 07/07/2010   Bariatric surgery status 07/07/2010   Qualifier: Diagnosis of  By: Kassie MD, Sean A    Cancer [facility])    uterine   Cataract    Coronary artery disease    DEPRESSION 11/21/2007   DIABETES MELLITUS, TYPE II 07/08/2007   DYSPNEA 05/20/2009   Edema 05/20/2009   Eosinophilic esophagitis 02/13/2008   FEVER UNSPECIFIED 10/20/2009   FEVER, HX OF 03/09/2010   GERD (gastroesophageal reflux disease)    Headache(784.0) 02/06/2010   Hepatomegaly 01/06/2008   HYPERLIPIDEMIA 07/08/2007   HYPERTENSION 07/08/2007   Kidney disease    followed by nephrology, per patient   LEUKOPENIA, MILD 09/29/2008   Lymphedema    left leg, dx approximately in 03/2023 per patient   Melanoma in situ of left upper arm (HCC) 04/03/2023   OBESITY 01/06/2008   OSTEOARTHRITIS 01/06/2008   Other chronic nonalcoholic liver disease 01/06/2008   PERIPHERAL NEUROPATHY 11/21/2007   Peripheral neuropathy    Postsurgical  hypothyroidism 09/19/2010   SINUSITIS- ACUTE-NOS 11/21/2007   TB SKIN TEST, POSITIVE 02/06/2010   THYROID  NODULE 03/09/2010    Past Surgical History:  Procedure Laterality Date   ABDOMINAL HYSTERECTOMY     BASAL  CELL CARCINOMA EXCISION     CARDIAC CATHETERIZATION     CHOLECYSTECTOMY N/A 05/11/2021   Procedure: LAPAROSCOPIC CHOLECYSTECTOMY WITH POSSIBLE  INTRAOPERATIVE CHOLANGIOGRAM;  Surgeon: Gladis Cough, MD;  Location: [facility];  Service: General;  Laterality: N/A;   COLONOSCOPY     CORONARY ATHERECTOMY N/A 05/03/2022   Procedure: CORONARY ATHERECTOMY;  Surgeon: Wendel Lurena POUR, MD;  Location: [facility];  Service: Cardiovascular;  Laterality: N/A;   CORONARY IMAGING/OCT N/A 05/03/2022   Procedure: INTRAVASCULAR IMAGING/OCT;  Surgeon: Wendel Lurena POUR, MD;  Location: [facility];  Service: Cardiovascular;  Laterality: N/A;   CORONARY PRESSURE/FFR STUDY N/A 05/03/2022   Procedure: INTRAVASCULAR PRESSURE WIRE/FFR STUDY;  Surgeon: Wendel Lurena POUR, MD;  Location: [facility];  Service: Cardiovascular;  Laterality: N/A;   CORONARY STENT INTERVENTION N/A 05/03/2022   Procedure: CORONARY STENT INTERVENTION;  Surgeon: Wendel Lurena POUR, MD;  Location: [facility];  Service: Cardiovascular;  Laterality: N/A;  lad   EYE SURGERY Bilateral 08/2018, 09/2018   FOOT SURGERY Left    10/2019   KNEE ARTHROSCOPY Left    LEFT HEART CATH AND CORONARY ANGIOGRAPHY N/A 01/20/2019   Procedure: LEFT HEART CATH AND CORONARY ANGIOGRAPHY;  Surgeon: Claudene Victory ORN, MD;  Location: [facility];  Service: Cardiovascular;  Laterality: N/A;   LYMPH NODE DISSECTION     OOPHORECTOMY     Right total knee replacement     RIGHT/LEFT HEART CATH AND CORONARY ANGIOGRAPHY N/A 05/03/2022   Procedure: RIGHT/LEFT HEART CATH AND CORONARY ANGIOGRAPHY;  Surgeon: Wendel Lurena POUR, MD;  [facility]: [facility];  Service: Cardiovascular;  Laterality: N/A;   ROUX-EN-Y GASTRIC BYPASS  2011   SKIN TAG REMOVAL     TEE WITH CARDIOVERSION     THYROIDECTOMY     TONSILLECTOMY AND ADENOIDECTOMY     TOOTH EXTRACTION  2023   TRANSESOPHAGEAL ECHOCARDIOGRAM (CATH LAB) N/A 07/06/2024   Procedure: TRANSESOPHAGEAL  ECHOCARDIOGRAM;  Surgeon: Loni Soyla LABOR, MD;  Location: [facility];  Service: Cardiovascular;  Laterality: N/A;   UPPER GASTROINTESTINAL ENDOSCOPY     UPPER GI ENDOSCOPY  2024    Family History  Problem Relation Age of Onset   Diabetes Mother    Neuropathy Mother    Diabetes Father    Congestive Heart Failure Father    Hypertension Father    Myelodysplastic syndrome Father    Bladder Cancer Sister    Multiple sclerosis Sister    Breast cancer Sister        breast onset age 74   Osteoporosis Sister    Liver cancer Maternal Aunt    Breast cancer Maternal Aunt    Diabetes Maternal Aunt    Colon cancer Maternal Uncle    Diabetes Maternal Uncle    Neuropathy Paternal Aunt    Diabetes Maternal Grandmother    Diabetes Paternal Grandmother     Social History   Socioeconomic History   Marital status: Single    Spouse name: Not on file   Number of children: 0   Years of education: 16   Highest education level: Bachelor's degree (e.g., BA, AB, BS)  Occupational History   Occupation: Research scientist (medical)   Occupation: Retired  Tobacco Use   Smoking status:  Former    Current packs/day: 0.00    Average packs/day: 1.5 packs/day for 15.0 years (22.5 ttl pk-yrs)    Types: Cigarettes    Start date: 42    Quit date: 27    Years since quitting: 33.7    Passive exposure: Never   Smokeless tobacco: Never  Vaping Use   Vaping status: Never Used  Substance and Sexual Activity   Alcohol use: Yes    Comment: rarely   Drug use: Never   Sexual activity: Not Currently  Other Topics Concern   Not on file  Social History Narrative   Not on file   Social Drivers of Health   Financial Resource Strain: Low Risk  (05/06/2024)   Overall Financial Resource Strain (CARDIA)    Difficulty of Paying Living Expenses: Not very hard  Food Insecurity: Low Risk  (07/19/2024)   Received from [facility]   Hunger Vital Sign    Within the past 12 months, you worried that your food would run  out before you got money to buy more: Never true    Within the past 12 months, the food you bought just didn't last and you didn't have money to get more. : Never true  Transportation Needs: No Transportation Needs (07/19/2024)   Received from [facility]    In the past 12 months, has lack of reliable transportation kept you from medical appointments, meetings, work or from getting things needed for daily living? : No  Physical Activity: Inactive (05/06/2024)   Exercise Vital Sign    Days of Exercise per Week: 0 days    Minutes of Exercise per Session: Not on file  Stress: No Stress Concern Present (05/06/2024)   [employer] of Occupational Health - Occupational Stress Questionnaire    Feeling of Stress: Only a little  Social Connections: Moderately Integrated (05/06/2024)   Social Connection and Isolation Panel    Frequency of Communication with Friends and Family: More than three times a week    Frequency of Social Gatherings with Friends and Family: Three times a week    Attends Religious Services: More than 4 times per year    Active Member of Clubs or Organizations: Yes    Attends Banker Meetings: More than 4 times per year    Marital Status: Never married  Intimate Partner Violence: Not At Risk (05/19/2020)   Humiliation, Afraid, Rape, and Kick questionnaire    Fear of Current or Ex-Partner: No    Emotionally Abused: No    Physically Abused: No    Sexually Abused: No     Prior to Admission medications   Medication Sig Start Date End Date Taking? Authorizing Provider  acetaminophen  (TYLENOL ) 500 MG tablet Take 2 tablets (1,000 mg total) by mouth every 6 (six) hours as needed for mild pain or fever. Patient not taking: Reported on 07/23/2024 05/13/21   Rai, Nydia POUR, MD  atorvastatin  (LIPITOR) 40 MG tablet Take 1 tablet (40 mg total) by mouth daily. 07/22/24   Acharya, Gayatri A, MD  blood glucose meter kit and supplies KIT Use up to three  times  daily as directed. 08/27/23   Kennyth Worth HERO, MD  Blood Glucose Monitoring Suppl DEVI 1 each by Does not apply route in the morning, at noon, and at bedtime. May substitute to any manufacturer covered by patient's insurance. 10/29/23   Motwani, Komal, MD  Cholecalciferol (VITAMIN D ) 50 MCG (2000 UT) tablet Take 4,000 Units by mouth in  the morning.    [provider]  clopidogrel  (PLAVIX ) 75 MG tablet Take 1 tablet (75 mg total) by mouth daily. 02/17/24   Acharya, Gayatri A, MD  Colchicine  (MITIGARE ) 0.6 MG CAPS Take 0.6 mg by mouth as needed (gout pain). Patient not taking: Reported on 07/23/2024    [provider]  Continuous Glucose Sensor (DEXCOM G7 SENSOR) MISC 1 Device by Does not apply route See admin instructions. 07/17/24   Motwani, Komal, MD  cyclobenzaprine  (FLEXERIL ) 10 MG tablet Take 10 mg by mouth at bedtime as needed for muscle spasms. Patient not taking: Reported on 07/23/2024 07/06/20   [provider]  diclofenac  sodium (VOLTAREN ) 1 % GEL Apply 4 g topically 4 (four) times daily. 09/06/17   Kassie Mallick, MD  Dulaglutide  (TRULICITY ) 4.5 MG/0.5ML SOAJ Inject 4.5 mg as directed once a week. 06/22/24   Motwani, Komal, MD  fluconazole  (DIFLUCAN ) 200 MG tablet Take 200 mg by mouth as needed (yeast).    [provider]  furosemide  (LASIX ) 20 MG tablet Take 2 tablets (40 mg total) by mouth daily. 07/23/24   Acharya, Gayatri A, MD  gabapentin  (NEURONTIN ) 600 MG tablet Take 600 mg by mouth 2 (two) times daily. 03/22/24   [provider]  Glucagon  (BAQSIMI  ONE PACK) 3 MG/DOSE POWD Place 1 Device into the nose as needed (Low blood sugar with impaired consciousness). Patient not taking: Reported on 07/23/2024 10/17/23   Motwani, Komal, MD  Halobetasol & Lactic Acid (ULTRAVATE X, OINTMENT, EX) Apply 1 Application topically 2 (two) times daily.    [provider]  halobetasol (ULTRAVATE) 0.05 % ointment Apply 1 Application topically 3 (three) times a  week. 05/19/23   [provider]  HYDROcodone -acetaminophen  (NORCO/VICODIN) 5-325 MG tablet Take 1 tablet by mouth as needed for moderate pain (pain score 4-6).    [provider]  insulin  glargine (LANTUS  SOLOSTAR) 100 UNIT/ML Solostar Pen Inject 25 Units into the skin daily. 06/26/24   Motwani, Komal, MD  insulin  lispro (HUMALOG ) 100 UNIT/ML KwikPen Inject 15 units with break fast and 15 units with lunch, 20-22 with supper 15 min before meals 07/17/24   Motwani, Obadiah, MD  Insulin  Pen Needle (BD PEN NEEDLE MINI ULTRAFINE) 31G X 5 MM MISC Use as directed with insulin . 04/23/24   Motwani, Obadiah, MD  levalbuterol  (XOPENEX  HFA) 45 MCG/ACT inhaler Inhale 1 puff into the lungs every 4 (four) hours as needed for wheezing. Patient not taking: Reported on 07/10/2024 05/10/22   Walker, Caitlin S, NP  levocetirizine (XYZAL ) 5 MG tablet TAKE 1 TABLET EVERY EVENING 07/20/24   Kennyth Worth HERO, MD  levothyroxine  (SYNTHROID ) 125 MCG tablet Take 1 tablet (125 mcg total) by mouth daily. 04/15/24   Motwani, Komal, MD  lidocaine  (LIDODERM ) 5 % PLACE 1 PATCH ONTO THE SKIN DAILY. REMOVE & DISCARD PATCH WITHIN 12 HOURS OR AS DIRECTED BY MD 01/15/23   Cheryl Waddell HERO, PA-C  lipase/protease/amylase (CREON ) 36000 UNITS CPEP capsule Take 2 capsules by mouth with meals and 1 with snack. 07/15/24   [place], PA-C  MAGNESIUM PO Take 3 tablets by mouth daily as needed (foot cramps).    [provider]  methocarbamol (ROBAXIN) 750 MG tablet Take 750 mg by mouth every 6 (six) hours as needed. Patient not taking: Reported on 07/23/2024 08/12/23   [provider]  Multiple Vitamin (MULTIVITAMIN) tablet Take 1 tablet by mouth in the morning and at bedtime.    [provider]  nitroGLYCERIN  (NITROSTAT ) 0.4 MG  SL tablet Place 1 tablet (0.4 mg total) under the tongue every 5 (five) minutes as needed for chest pain. 07/23/24   Acharya, Gayatri A, MD  pantoprazole  (PROTONIX ) 40 MG tablet TAKE 1 TABLET TWICE A  DAY 07/21/24   Kennyth Worth HERO, MD  phenazopyridine  (PYRIDIUM ) 100 MG tablet Take 1 tablet (100 mg total) by mouth 3 (three) times daily with meals. Patient taking differently: Take 100 mg by mouth 3 (three) times daily as needed for pain. 09/26/23   Nche, Roselie Rockford, NP  potassium chloride  SA (KLOR-CON  M) 20 MEQ tablet Take 1 tablet (20 mEq total) by mouth daily. 07/10/24   Acharya, Gayatri A, MD  sucralfate  (CARAFATE ) 1 GM/10ML suspension Take 10 mLs (1 g total) by mouth 4 (four) times daily as needed. Patient not taking: Reported on 07/10/2024 07/06/24   Acharya, Gayatri A, MD  traMADol  (ULTRAM ) 50 MG tablet Take 50 mg by mouth every 6 (six) hours as needed (pain). Patient not taking: Reported on 07/23/2024    [provider]  triamcinolone cream (KENALOG) 0.1 % Apply 1 Application topically 2 (two) times daily. 06/25/24   [provider]    Allergies  Allergen Reactions   Ace Inhibitors Cough   Caffeine     PVCs   Ciprofloxacin Itching   Morphine  And Codeine Itching    Pt tolerates medication if given with diphenhydramine    Mucinex [Guaifenesin Er]     PVCs   Nsaids     Gastric Bypass Surgery - unable to take, tolerates ec 81 aspirin     Other     Antihistamine-alkylamine - PVCs tolerates benadryl     Silicone Hives   Tape Hives    REVIEW OF SYSTEMS:  General: no fevers/chills/night sweats Eyes: no blurry vision, diplopia, or amaurosis ENT: no sore throat or hearing loss Resp: no cough, wheezing, or hemoptysis CV: no edema or palpitations GI: no abdominal pain, nausea, vomiting, diarrhea, or constipation GU: no dysuria, frequency, or hematuria Skin: no rash Neuro: no headache, numbness, tingling, or weakness of extremities Musculoskeletal: no joint pain or swelling Heme: no bleeding, DVT, or easy bruising Endo: no polydipsia or polyuria  BP 119/73   Pulse 80   Ht 5' 11 (1.803 m)   Wt 206 lb 6.4 oz (93.6 kg)   SpO2 98%   BMI 28.79 kg/m   PHYSICAL  EXAM: GEN:  AO x 3 in no acute distress HEENT: normal Dentition: Normal Neck: JVP normal. +2 carotid upstrokes without bruits. No thyromegaly. Lungs: equal expansion, clear bilaterally CV: Apex is discrete and nondisplaced, RRR with very soft systolic murmur at axilla Abd: soft, non-tender, non-distended; no bruit; positive bowel sounds Ext: +3 left lower edema, +2 right lower edema Vascular: 2+ femoral pulses, 2+ radial pulses       Skin: warm and dry without rash Neuro: CN II-XII grossly intact; motor and sensory grossly intact    DATA AND STUDIES:  EKG:  EKG Interpretation Date/Time:  Monday July 27 2024 09:58:55 EDT Ventricular Rate:  76 PR Interval:  134 QRS Duration:  90 QT Interval:  384 QTC Calculation: 432 R Axis:   -3  Text Interpretation: Normal sinus rhythm Normal ECG When compared with ECG of 01-Jul-2024 11:20, No significant change was found Confirmed by Wendel Haws ([id number]) on 07/27/2024 10:06:04 AM        CARDIAC STUDIES: Refer to CV Procedures and Imaging Tabs  04/09/2024: TSH 8.25 05/07/2024: ALT 75 06/29/2024: Platelet Count 182 07/06/2024: BUN 15; Creatinine, Ser 1.30;  Hemoglobin 9.5; Potassium 4.3; Sodium 142   STS RISK CALCULATOR: Pending  NHYA CLASS: 2     ASSESSMENT AND PLAN:   1. Nonrheumatic mitral valve regurgitation   2. Nonrheumatic tricuspid valve regurgitation   3. Coronary artery disease involving native coronary artery of native heart without angina pectoris   4. Type 2 diabetes mellitus with complication, with long-term current use of insulin  (HCC)   5. Hypertension associated with diabetes (HCC)   6. Aortic atherosclerosis (HCC)   7. CKD stage 3 due to type 2 diabetes mellitus (HCC)   8. BMI 29.0-29.9,adult   9. Pre-operative laboratory examination     Mitral regurgitation: Prior reviewed with the patient has a very large left atrium and severe mitral regurgitation consistent with atrial functional mitral regurgitation.   This could be potentially treated with mitral transcatheter edge-to-edge repair.  However in this relatively young patient with tricuspid regurgitation as well I think she needs to be considered for multiple surgical valve repair.  Certainly if she was judged not to be a candidate for surgery then we could pursue a mitral transcatheter edge-to-edge repair in isolation.  We would then monitor her TR and if it worsened we could consider a tricuspid edge-to-edge repair in the future.  Again in this relatively young patient I think surgery should be considered.  I will refer her for coronary angiography and right heart catheterization.  Will then refer her for surgical evaluation.  I will review her imaging in our structural heart disease meeting after her coronary angiography and right heart catheterization have been performed. Tricuspid regurgitation: Moderate on TEE.  If his surgery is to be performed for the mitral valve then I think I tricuspid repair should also be considered.  See discussion above if mitral transcatheter edge-to-edge repair is to be pursued. Coronary artery disease: Status post complex PCI of proximal to mid LAD in 2023 continue Plavix  75 mg indefinitely T2DM: Continue Plavix  75, atorvastatin  40, and consider ARB and SGLT2 inhibitor Hypertension: Blood pressure is well-controlled. Aortic atherosclerosis: Continue Plavix  75, atorvastatin  40 CKD stage III: Consider ARB, SGLT2 inhibitor Elevated BMI: Consider referral to pharmacy for GLP-1 receptor agonist therapy  I have personally reviewed the patients imaging data as summarized above.  I have reviewed the natural history of mitral regurgitation with the patient and family members who are present today. We have discussed the limitations of medical therapy and the poor prognosis associated with symptomatic mitral regurgitation. We have also reviewed potential treatment options, including palliative medical therapy, conventional mitral  surgery, and transcatheter mitral edge-to-edge repair. We discussed treatment options in the context of this patient's specific comorbid medical conditions.   All of the patient's questions were answered today. Will make further recommendations based on the results of studies outlined above.   I spent 50 minutes reviewing all clinical data during and prior to this visit including all relevant imaging studies, laboratories, clinical information from other health systems and prior notes from both Cardiology and other specialties, interviewing the patient, conducting a complete physical examination, and coordinating care in order to formulate a comprehensive and personalized evaluation and treatment plan.   Ariele Vidrio K Reagyn Facemire, MD  07/27/2024 10:40 AM    [facility] [address] [place], [place], [place]  [address] Phone: [phone]; Fax: [phone]

## 2024-07-27 ENCOUNTER — Ambulatory Visit: Attending: Internal Medicine | Admitting: Internal Medicine

## 2024-07-27 ENCOUNTER — Telehealth: Payer: Self-pay

## 2024-07-27 ENCOUNTER — Encounter: Payer: Self-pay | Admitting: Internal Medicine

## 2024-07-27 VITALS — BP 119/73 | HR 80 | Ht 71.0 in | Wt 206.4 lb

## 2024-07-27 DIAGNOSIS — E118 Type 2 diabetes mellitus with unspecified complications: Secondary | ICD-10-CM | POA: Insufficient documentation

## 2024-07-27 DIAGNOSIS — Z01812 Encounter for preprocedural laboratory examination: Secondary | ICD-10-CM | POA: Insufficient documentation

## 2024-07-27 DIAGNOSIS — Z794 Long term (current) use of insulin: Secondary | ICD-10-CM | POA: Insufficient documentation

## 2024-07-27 DIAGNOSIS — Z6829 Body mass index (BMI) 29.0-29.9, adult: Secondary | ICD-10-CM | POA: Diagnosis not present

## 2024-07-27 DIAGNOSIS — E1159 Type 2 diabetes mellitus with other circulatory complications: Secondary | ICD-10-CM | POA: Diagnosis not present

## 2024-07-27 DIAGNOSIS — E1122 Type 2 diabetes mellitus with diabetic chronic kidney disease: Secondary | ICD-10-CM | POA: Insufficient documentation

## 2024-07-27 DIAGNOSIS — I152 Hypertension secondary to endocrine disorders: Secondary | ICD-10-CM | POA: Diagnosis not present

## 2024-07-27 DIAGNOSIS — I340 Nonrheumatic mitral (valve) insufficiency: Secondary | ICD-10-CM | POA: Insufficient documentation

## 2024-07-27 DIAGNOSIS — I361 Nonrheumatic tricuspid (valve) insufficiency: Secondary | ICD-10-CM | POA: Insufficient documentation

## 2024-07-27 DIAGNOSIS — I2510 Atherosclerotic heart disease of native coronary artery without angina pectoris: Secondary | ICD-10-CM | POA: Insufficient documentation

## 2024-07-27 DIAGNOSIS — I700 Atherosclerosis of aorta: Secondary | ICD-10-CM | POA: Insufficient documentation

## 2024-07-27 DIAGNOSIS — N183 Chronic kidney disease, stage 3 unspecified: Secondary | ICD-10-CM | POA: Diagnosis not present

## 2024-07-27 NOTE — Telephone Encounter (Signed)
 Savannah [name]:  Fossa looks good for transseptal approach in the bicaval and short axis views. Adequate space within the LA to straddle device and maneuver down to the valve. Patient has central MR at A2P2 due to bilateral leaflet tethering. Posterior leaflet length in long axis views measures ~0.9, enough for our larger clip options. Gradient is and we will want to confirm valve area before the procedure. Plan on placing first clip central at A2P2 and see how the MR responds. XT/XTW appropriate.

## 2024-07-27 NOTE — Patient Instructions (Addendum)
 Medication Instructions:  No medication changes were made at this visit. Continue current regimen.   *If you need a refill on your cardiac medications before your next appointment, please call your pharmacy*  Lab Work: To be completed today: BMP and CBC  If you have labs (blood work) drawn today and your tests are completely normal, you will receive your results only by: MyChart Message (if you have MyChart) OR A paper copy in the mail If you have any lab test that is abnormal or we need to change your treatment, we will call you to review the results.  Testing/Procedures: Your physician has requested that you have a cardiac catheterization on 08/06/24 at 9 AM (please arrive by 7 AM). Cardiac catheterization is used to diagnose and/or treat various heart conditions. Doctors may recommend this procedure for a number of different reasons. The most common reason is to evaluate chest pain. Chest pain can be a symptom of coronary artery disease (CAD), and cardiac catheterization can show whether plaque is narrowing or blocking your heart's arteries. This procedure is also used to evaluate the valves, as well as measure the blood flow and oxygen  levels in different parts of your heart. For further information please visit [email or web address] Please follow instruction sheet, as given.   Follow-Up: At [facility], you and your health needs are our priority.  As part of our continuing mission to provide you with exceptional heart care, our providers are all part of one team.  This team includes your primary Cardiologist (physician) and Advanced Practice Providers or APPs (Physician Assistants and Nurse Practitioners) who all work together to provide you with the care you need, when you need it.  Your next appointment:   Per structural heart team  Provider:   Lurena Red, MD or Izetta Hummer, PA-C    We recommend signing up for the patient portal called MyChart.  Sign up information  is provided on this After Visit Summary.  MyChart is used to connect with patients for Virtual Visits (Telemedicine).  Patients are able to view lab/test results, encounter notes, upcoming appointments, etc.  Non-urgent messages can be sent to your provider as well.   To learn more about what you can do with MyChart, go to [email or web address].   Other Instructions        Cardiac/Peripheral Catheterization   You are scheduled for a Cardiac Catheterization on Thursday, September 25 with Dr. Lurena Red.  1. Please arrive at the [facility] (Main Entrance A) at [facility]: [address], [place] [address] at 7:00 AM (This time is 2 hour(s) before your procedure to ensure your preparation). Your procedure is scheduled to begin at 9 AM.  Free valet parking service is available. You will check in at ADMITTING. The support person will be asked to wait in the waiting room.  It is OK to have someone drop you off and come back when you are ready to be discharged.        Special note: Every effort is made to have your procedure done on time. Please understand that emergencies sometimes delay scheduled procedures.  2. Diet: Nothing to eat after midnight.  3. Hydration:You need to be well hydrated before your procedure. On September 25, you may drink approved liquids (see below) until 2 hours before the procedure, with 16 oz of water as your last intake.   List of approved liquids water, clear juice, clear tea, black coffee, fruit juices, non-citric and without pulp, carbonated beverages,  Gatorade, Kool -Aid, plain Jello-O and plain ice popsicles.  4. Labs: You will need to have blood drawn on Monday, September 15 at [facility] (1st Floor), [address], [place], [place] [address]. You do not need to be fasting.  5. Medication instructions in preparation for your procedure:   Contrast Allergy : No   Stop taking, Lasix   (Furosemide )  Wednesday, September 24, STOP taking Trulicity  (Dulaglutide ) 1 week prior to procedure (do not take after 07/30/24)   DO NOT TAKE ANY INSULIN  MORNING OF PROCEDURE.   On the morning of your procedure, take Plavix /Clopidogrel  and any morning medicines NOT listed above.  You may use sips of water.  6. Plan to go home the same day, you will only stay overnight if medically necessary. 7. You MUST have a responsible adult to drive you home. 8. An adult MUST be with you the first 24 hours after you arrive home. 9. Bring a current list of your medications, and the last time and date medication taken. 10. Bring ID and current insurance cards. 11.Please wear clothes that are easy to get on and off and wear slip-on shoes.  Thank you for allowing us  to care for you!   -- [facility]

## 2024-07-28 ENCOUNTER — Ambulatory Visit: Payer: Self-pay | Admitting: Internal Medicine

## 2024-07-28 LAB — BASIC METABOLIC PANEL WITH GFR
BUN/Creatinine Ratio: 20 (ref 12–28)
BUN: 32 mg/dL — ABNORMAL HIGH (ref 8–27)
CO2: 20 mmol/L (ref 20–29)
Calcium: 8.7 mg/dL (ref 8.7–10.3)
Chloride: 105 mmol/L (ref 96–106)
Creatinine, Ser: 1.62 mg/dL — ABNORMAL HIGH (ref 0.57–1.00)
Glucose: 113 mg/dL — ABNORMAL HIGH (ref 70–99)
Potassium: 4.9 mmol/L (ref 3.5–5.2)
Sodium: 141 mmol/L (ref 134–144)
eGFR: 34 mL/min/1.73 — ABNORMAL LOW (ref >59)

## 2024-07-28 LAB — CBC
Hematocrit: 38 % (ref 34.0–46.6)
Hemoglobin: 12 g/dL (ref 11.1–15.9)
MCH: 30.6 pg (ref 26.6–33.0)
MCHC: 31.6 g/dL (ref 31.5–35.7)
MCV: 97 fL (ref 79–97)
Platelets: 196 x10E3/uL (ref 150–450)
RBC: 3.92 x10E6/uL (ref 3.77–5.28)
RDW: 15.1 % (ref 11.7–15.4)
WBC: 4.2 x10E3/uL (ref 3.4–10.8)

## 2024-07-29 ENCOUNTER — Other Ambulatory Visit: Payer: Self-pay

## 2024-07-29 ENCOUNTER — Encounter: Payer: Self-pay | Admitting: "Endocrinology

## 2024-07-29 DIAGNOSIS — E1165 Type 2 diabetes mellitus with hyperglycemia: Secondary | ICD-10-CM

## 2024-07-30 ENCOUNTER — Telehealth: Payer: Self-pay | Admitting: *Deleted

## 2024-07-30 MED ORDER — INSULIN LISPRO (1 UNIT DIAL) 100 UNIT/ML (KWIKPEN): PEN_INJECTOR | SUBCUTANEOUS | 2 refills | Status: AC

## 2024-07-30 NOTE — Telephone Encounter (Signed)
 Cardiac Catheterization scheduled at [facility] for: Thursday August 06, 2024 12 Noon Arrival time [facility] Main Entrance A at: 7 AM-pre-procedure hydration  Diet: -Nothing to eat after midnight.  Hydration: -May drink clear liquids until 2 hours before the procedure.  Approved liquids: Water, clear tea, black coffee, fruit juices-non-citric and without pulp,Gatorade, plain Jello/popsicles.  No PO hydration (bottle of water) 2 hours prior-going in early for IV hydration.   Medication instructions: -Hold:  Lasix /KCl-day before and day of procedure  Insulin -AM of procedure  Trulicity -weekly on Sundays -Other usual morning medications can be taken including aspirin  81 mg and Plavix  75 mg  Plan to go home the same day, you will only stay overnight if medically necessary.  You must have responsible adult to drive you home.  Someone must be with you the first 24 hours after you arrive home.  Reviewed procedure instructions/pre-procedure hydration with patient.

## 2024-08-03 ENCOUNTER — Ambulatory Visit: Admitting: Internal Medicine

## 2024-08-06 ENCOUNTER — Encounter (HOSPITAL_COMMUNITY)
Admission: RE | Disposition: A | Discharge status: Home / Self Care | Payer: Self-pay | Source: Home / Self Care | Attending: Internal Medicine

## 2024-08-06 ENCOUNTER — Other Ambulatory Visit: Payer: Self-pay | Admitting: Cardiology

## 2024-08-06 ENCOUNTER — Other Ambulatory Visit: Payer: Self-pay

## 2024-08-06 ENCOUNTER — Ambulatory Visit (HOSPITAL_COMMUNITY)
Admission: RE | Admit: 2024-08-06 | Discharge: 2024-08-06 | Disposition: A | Discharge status: Home / Self Care | Attending: Internal Medicine | Admitting: Internal Medicine

## 2024-08-06 DIAGNOSIS — Z6829 Body mass index (BMI) 29.0-29.9, adult: Secondary | ICD-10-CM | POA: Insufficient documentation

## 2024-08-06 DIAGNOSIS — I890 Lymphedema, not elsewhere classified: Secondary | ICD-10-CM | POA: Insufficient documentation

## 2024-08-06 DIAGNOSIS — I2510 Atherosclerotic heart disease of native coronary artery without angina pectoris: Secondary | ICD-10-CM | POA: Insufficient documentation

## 2024-08-06 DIAGNOSIS — E1159 Type 2 diabetes mellitus with other circulatory complications: Secondary | ICD-10-CM | POA: Diagnosis not present

## 2024-08-06 DIAGNOSIS — I340 Nonrheumatic mitral (valve) insufficiency: Secondary | ICD-10-CM

## 2024-08-06 DIAGNOSIS — Z90710 Acquired absence of both cervix and uterus: Secondary | ICD-10-CM | POA: Diagnosis not present

## 2024-08-06 DIAGNOSIS — I152 Hypertension secondary to endocrine disorders: Secondary | ICD-10-CM | POA: Insufficient documentation

## 2024-08-06 DIAGNOSIS — Z794 Long term (current) use of insulin: Secondary | ICD-10-CM | POA: Diagnosis not present

## 2024-08-06 DIAGNOSIS — Z8542 Personal history of malignant neoplasm of other parts of uterus: Secondary | ICD-10-CM | POA: Insufficient documentation

## 2024-08-06 DIAGNOSIS — E785 Hyperlipidemia, unspecified: Secondary | ICD-10-CM | POA: Diagnosis not present

## 2024-08-06 DIAGNOSIS — Z79899 Other long term (current) drug therapy: Secondary | ICD-10-CM | POA: Insufficient documentation

## 2024-08-06 DIAGNOSIS — Z7902 Long term (current) use of antithrombotics/antiplatelets: Secondary | ICD-10-CM | POA: Insufficient documentation

## 2024-08-06 DIAGNOSIS — Z955 Presence of coronary angioplasty implant and graft: Secondary | ICD-10-CM | POA: Diagnosis not present

## 2024-08-06 DIAGNOSIS — I700 Atherosclerosis of aorta: Secondary | ICD-10-CM | POA: Insufficient documentation

## 2024-08-06 DIAGNOSIS — Z7985 Long-term (current) use of injectable non-insulin antidiabetic drugs: Secondary | ICD-10-CM | POA: Insufficient documentation

## 2024-08-06 DIAGNOSIS — I081 Rheumatic disorders of both mitral and tricuspid valves: Secondary | ICD-10-CM | POA: Diagnosis not present

## 2024-08-06 DIAGNOSIS — E1122 Type 2 diabetes mellitus with diabetic chronic kidney disease: Secondary | ICD-10-CM | POA: Diagnosis not present

## 2024-08-06 DIAGNOSIS — N1832 Chronic kidney disease, stage 3b: Secondary | ICD-10-CM | POA: Diagnosis not present

## 2024-08-06 DIAGNOSIS — I428 Other cardiomyopathies: Secondary | ICD-10-CM

## 2024-08-06 DIAGNOSIS — Z87891 Personal history of nicotine dependence: Secondary | ICD-10-CM | POA: Insufficient documentation

## 2024-08-06 LAB — POCT I-STAT EG7
Acid-base deficit: 5 mmol/L — ABNORMAL HIGH (ref 0.0–2.0)
Acid-base deficit: 6 mmol/L — ABNORMAL HIGH (ref 0.0–2.0)
Bicarbonate: 20.5 mmol/L (ref 20.0–28.0)
Bicarbonate: 21.5 mmol/L (ref 20.0–28.0)
Calcium, Ion: 1.14 mmol/L — ABNORMAL LOW (ref 1.15–1.40)
Calcium, Ion: 1.18 mmol/L (ref 1.15–1.40)
HCT: 31 % — ABNORMAL LOW (ref 36.0–46.0)
HCT: 32 % — ABNORMAL LOW (ref 36.0–46.0)
Hemoglobin: 10.5 g/dL — ABNORMAL LOW (ref 12.0–15.0)
Hemoglobin: 10.9 g/dL — ABNORMAL LOW (ref 12.0–15.0)
O2 Saturation: 71 %
O2 Saturation: 75 %
Potassium: 4 mmol/L (ref 3.5–5.1)
Potassium: 4.1 mmol/L (ref 3.5–5.1)
Sodium: 141 mmol/L (ref 135–145)
Sodium: 142 mmol/L (ref 135–145)
TCO2: 22 mmol/L (ref 22–32)
TCO2: 23 mmol/L (ref 22–32)
pCO2, Ven: 42.7 mmHg — ABNORMAL LOW (ref 44–60)
pCO2, Ven: 45.7 mmHg (ref 44–60)
pH, Ven: 7.282 (ref 7.25–7.43)
pH, Ven: 7.289 (ref 7.25–7.43)
pO2, Ven: 41 mmHg (ref 32–45)
pO2, Ven: 45 mmHg (ref 32–45)

## 2024-08-06 LAB — POCT I-STAT 7, (LYTES, BLD GAS, ICA,H+H)
Acid-base deficit: 6 mmol/L — ABNORMAL HIGH (ref 0.0–2.0)
Bicarbonate: 19.9 mmol/L — ABNORMAL LOW (ref 20.0–28.0)
Calcium, Ion: 1.16 mmol/L (ref 1.15–1.40)
HCT: 31 % — ABNORMAL LOW (ref 36.0–46.0)
Hemoglobin: 10.5 g/dL — ABNORMAL LOW (ref 12.0–15.0)
O2 Saturation: 91 %
Potassium: 4.1 mmol/L (ref 3.5–5.1)
Sodium: 141 mmol/L (ref 135–145)
TCO2: 21 mmol/L — ABNORMAL LOW (ref 22–32)
pCO2 arterial: 40.1 mmHg (ref 32–48)
pH, Arterial: 7.303 — ABNORMAL LOW (ref 7.35–7.45)
pO2, Arterial: 66 mmHg — ABNORMAL LOW (ref 83–108)

## 2024-08-06 LAB — GLUCOSE, CAPILLARY: Glucose-Capillary: 158 mg/dL — ABNORMAL HIGH (ref 70–99)

## 2024-08-06 SURGERY — RIGHT/LEFT HEART CATH AND CORONARY ANGIOGRAPHY
Anesthesia: LOCAL

## 2024-08-06 MED ORDER — MIDAZOLAM HCL 2 MG/2ML IJ SOLN
INTRAMUSCULAR | Status: DC | PRN
  Administered 2024-08-06 (×2): 1 mg via INTRAVENOUS

## 2024-08-06 MED ORDER — LIDOCAINE HCL (PF) 1 % IJ SOLN
INTRAMUSCULAR | Status: AC
Start: 2024-08-06 — End: 2024-08-06
  Filled 2024-08-06: qty 30

## 2024-08-06 MED ORDER — VERAPAMIL HCL 2.5 MG/ML IV SOLN: INTRAVENOUS | Status: DC | PRN

## 2024-08-06 MED ORDER — SODIUM CHLORIDE 0.9% FLUSH: 3.0000 mL | INTRAVENOUS | Status: DC | PRN

## 2024-08-06 MED ORDER — CLOPIDOGREL BISULFATE 75 MG PO TABS
75.0000 mg | ORAL_TABLET | ORAL | Status: DC
Start: 2024-08-06 — End: 2024-08-06

## 2024-08-06 MED ORDER — ONDANSETRON HCL 4 MG/2ML IJ SOLN: 4.0000 mg | Freq: Four times a day (QID) | INTRAMUSCULAR | Status: DC | PRN

## 2024-08-06 MED ORDER — SODIUM CHLORIDE 0.9 % WEIGHT BASED INFUSION
3.0000 mL/kg/h | INTRAVENOUS | Status: AC
  Administered 2024-08-06: 3 mL/kg/h via INTRAVENOUS

## 2024-08-06 MED ORDER — LABETALOL HCL 5 MG/ML IV SOLN: 10.0000 mg | INTRAVENOUS | Status: DC | PRN

## 2024-08-06 MED ORDER — LIDOCAINE HCL (PF) 1 % IJ SOLN
INTRAMUSCULAR | Status: DC | PRN
  Administered 2024-08-06: 2 mL

## 2024-08-06 MED ORDER — IOHEXOL 350 MG/ML SOLN
INTRAVENOUS | Status: DC | PRN
  Administered 2024-08-06: 30 mL

## 2024-08-06 MED ORDER — HEPARIN SODIUM (PORCINE) 1000 UNIT/ML IJ SOLN
INTRAMUSCULAR | Status: AC
  Filled 2024-08-06: qty 10

## 2024-08-06 MED ORDER — FENTANYL CITRATE (PF) 100 MCG/2ML IJ SOLN
INTRAMUSCULAR | Status: DC | PRN
  Administered 2024-08-06 (×2): 25 ug via INTRAVENOUS

## 2024-08-06 MED ORDER — MIDAZOLAM HCL 2 MG/2ML IJ SOLN
INTRAMUSCULAR | Status: AC
  Filled 2024-08-06: qty 2

## 2024-08-06 MED ORDER — ACETAMINOPHEN 325 MG PO TABS: 650.0000 mg | ORAL_TABLET | ORAL | Status: DC | PRN

## 2024-08-06 MED ORDER — SODIUM CHLORIDE 0.9% FLUSH: 3.0000 mL | Freq: Two times a day (BID) | INTRAVENOUS | Status: DC

## 2024-08-06 MED ORDER — HYDRALAZINE HCL 20 MG/ML IJ SOLN: 10.0000 mg | INTRAMUSCULAR | Status: DC | PRN

## 2024-08-06 MED ORDER — SODIUM CHLORIDE 0.9 % WEIGHT BASED INFUSION: 1.0000 mL/kg/h | INTRAVENOUS | Status: DC

## 2024-08-06 MED ORDER — FENTANYL CITRATE (PF) 100 MCG/2ML IJ SOLN
INTRAMUSCULAR | Status: AC
  Filled 2024-08-06: qty 2

## 2024-08-06 MED ORDER — FREE WATER: 500.0000 mL | Freq: Once | Status: DC

## 2024-08-06 MED ORDER — ASPIRIN 81 MG PO CHEW
81.0000 mg | CHEWABLE_TABLET | ORAL | Status: DC
Start: 2024-08-06 — End: 2024-08-06

## 2024-08-06 MED ORDER — VERAPAMIL HCL 2.5 MG/ML IV SOLN
INTRAVENOUS | Status: AC
  Filled 2024-08-06: qty 2

## 2024-08-06 MED ORDER — HEPARIN SODIUM (PORCINE) 1000 UNIT/ML IJ SOLN
INTRAMUSCULAR | Status: DC | PRN
  Administered 2024-08-06: 5000 [IU] via INTRA_ARTERIAL

## 2024-08-06 MED ORDER — SODIUM CHLORIDE 0.9 % IV SOLN: 250.0000 mL | INTRAVENOUS | Status: DC | PRN

## 2024-08-06 SURGICAL SUPPLY — 10 items
CATH BALLN WEDGE 5F 110CM (CATHETERS) IMPLANT
CATH INFINITI 5FR ANG PIGTAIL (CATHETERS) IMPLANT
CATH INFINITI AMBI 6FR TG (CATHETERS) IMPLANT
DEVICE RAD COMP TR BAND LRG (VASCULAR PRODUCTS) IMPLANT
GLIDESHEATH SLEND SS 6F .021 (SHEATH) IMPLANT
PACK CARDIAC CATHETERIZATION (CUSTOM PROCEDURE TRAY) ×1 IMPLANT
SET ATX-X65L (MISCELLANEOUS) IMPLANT
SHEATH GLIDE SLENDER 4/5FR (SHEATH) IMPLANT
WIRE EMERALD 3MM-J .035X260CM (WIRE) IMPLANT
WIRE MICROINTRODUCER 60CM (WIRE) IMPLANT

## 2024-08-06 NOTE — Progress Notes (Signed)
 Discharge instructions reviewed with patient an Darin Hoose at the bedside, denies questions or concerns at this time. TR Band removed, no s/s of complications. PT ambulated in the hallway to bathroom, was able to void without difficulty. Tolerated PO intake. PT was escorted from the unit via wheelchair to personal vehicle.

## 2024-08-06 NOTE — Discharge Instructions (Signed)

## 2024-08-06 NOTE — Interval H&P Note (Signed)
 History and Physical Interval Note:  08/06/2024 7:35 AM  Savannah [name]  has presented today for surgery, with the diagnosis of mitral regurgitation.  The various methods of treatment have been discussed with the patient and family. After consideration of risks, benefits and other options for treatment, the patient has consented to  Procedure(s): RIGHT/LEFT HEART CATH AND CORONARY ANGIOGRAPHY (N/A) as a surgical intervention.  The patient's history has been reviewed, patient examined, no change in status, stable for surgery.  I have reviewed the patient's chart and labs.  Questions were answered to the patient's satisfaction.     Savannah [name]

## 2024-08-07 ENCOUNTER — Encounter: Payer: Self-pay | Admitting: Gastroenterology

## 2024-08-07 ENCOUNTER — Encounter (HOSPITAL_COMMUNITY): Payer: Self-pay | Admitting: Internal Medicine

## 2024-08-11 ENCOUNTER — Encounter: Payer: Self-pay | Admitting: Internal Medicine

## 2024-08-12 ENCOUNTER — Other Ambulatory Visit: Payer: Self-pay | Admitting: Family Medicine

## 2024-08-12 NOTE — Telephone Encounter (Unsigned)
 Copied from CRM [id number]. Topic: Clinical - Medication Refill >> Aug 12, 2024 10:51 AM Rea C wrote: Medication: gabapentin  (NEURONTIN ) 600 MG tablet  Has the patient contacted their pharmacy? Did not. Patient stated that she knows Dr. Kennyth has written it in the past. Patient is also requesting a 3 month supply and if Dr. Kennyth can put refills on it.  (Agent: If no, request that the patient contact the pharmacy for the refill. If patient does not wish to contact the pharmacy document the reason why and proceed with request.) (Agent: If yes, when and what did the pharmacy advise?)  This is the patient's preferred pharmacy:    [facility] [address] [place] - [address] [place] [address] Phone: [phone] Fax: [phone]  Is this the correct pharmacy for this prescription? Yes If no, delete pharmacy and type the correct one.   Has the prescription been filled recently? No  Is the patient out of the medication? Patient has this week left.   Has the patient been seen for an appointment in the last year OR does the patient have an upcoming appointment? Yes  Can we respond through MyChart? Yes  Agent: Please be advised that Rx refills may take up to 3 business days. We ask that you follow-up with your pharmacy.

## 2024-08-13 MED ORDER — GABAPENTIN 600 MG PO TABS: 1200.0000 mg | ORAL_TABLET | Freq: Three times a day (TID) | ORAL | 0 refills | Status: DC

## 2024-08-18 DIAGNOSIS — Z23 Encounter for immunization: Secondary | ICD-10-CM | POA: Diagnosis not present

## 2024-08-26 ENCOUNTER — Encounter (HOSPITAL_COMMUNITY): Payer: Self-pay

## 2024-08-27 ENCOUNTER — Encounter: Payer: Self-pay | Admitting: Internal Medicine

## 2024-08-28 ENCOUNTER — Ambulatory Visit (HOSPITAL_COMMUNITY)
Admission: RE | Admit: 2024-08-28 | Discharge: 2024-08-28 | Disposition: A | Discharge status: Home / Self Care | Source: Ambulatory Visit | Attending: Cardiology | Admitting: Cardiology

## 2024-08-28 ENCOUNTER — Other Ambulatory Visit: Payer: Self-pay | Admitting: Cardiology

## 2024-08-28 DIAGNOSIS — I428 Other cardiomyopathies: Secondary | ICD-10-CM | POA: Insufficient documentation

## 2024-08-28 MED ORDER — GADOBUTROL 1 MMOL/ML IV SOLN
10.0000 mL | Freq: Once | INTRAVENOUS | Status: AC | PRN
  Administered 2024-08-28: 10 mL via INTRAVENOUS

## 2024-08-31 ENCOUNTER — Encounter: Payer: Self-pay | Admitting: Internal Medicine

## 2024-08-31 ENCOUNTER — Ambulatory Visit: Payer: Self-pay | Admitting: Cardiology

## 2024-09-01 ENCOUNTER — Other Ambulatory Visit: Payer: Self-pay | Admitting: "Endocrinology

## 2024-09-01 ENCOUNTER — Encounter: Payer: Self-pay | Admitting: Internal Medicine

## 2024-09-01 DIAGNOSIS — Z79899 Other long term (current) drug therapy: Secondary | ICD-10-CM

## 2024-09-01 DIAGNOSIS — E1165 Type 2 diabetes mellitus with hyperglycemia: Secondary | ICD-10-CM

## 2024-09-01 DIAGNOSIS — I340 Nonrheumatic mitral (valve) insufficiency: Secondary | ICD-10-CM

## 2024-09-01 DIAGNOSIS — I5032 Chronic diastolic (congestive) heart failure: Secondary | ICD-10-CM

## 2024-09-01 NOTE — Telephone Encounter (Signed)
 Acharya, Gayatri A, MD to Vicci Sherran NOVAK, RN  [facility] Triage (Selected Message)     09/01/24  4:32 PM Ok to move up her appt to nov 4 at 1020 and we will discuss.   Appointment rescheduled.

## 2024-09-07 ENCOUNTER — Encounter: Payer: Self-pay | Admitting: Internal Medicine

## 2024-09-11 ENCOUNTER — Other Ambulatory Visit: Payer: Self-pay | Admitting: Family Medicine

## 2024-09-12 ENCOUNTER — Other Ambulatory Visit: Payer: Self-pay | Admitting: Family Medicine

## 2024-09-12 ENCOUNTER — Other Ambulatory Visit: Payer: Self-pay | Admitting: "Endocrinology

## 2024-09-14 ENCOUNTER — Encounter: Payer: Self-pay | Admitting: Radiology

## 2024-09-14 NOTE — Telephone Encounter (Signed)
Requested Prescriptions     Pending Prescriptions Disp Refills    levothyroxine (SYNTHROID) 125 MCG tablet [Pharmacy Med Name: Levothyroxine Sodium 125 MCG Oral Tablet] 90 tablet 0     Sig: Take 1 tablet by mouth once daily

## 2024-09-15 ENCOUNTER — Encounter: Payer: Self-pay | Admitting: Internal Medicine

## 2024-09-15 ENCOUNTER — Ambulatory Visit: Attending: Internal Medicine | Admitting: Internal Medicine

## 2024-09-15 VITALS — BP 112/68 | HR 73 | Ht 71.0 in | Wt 215.2 lb

## 2024-09-15 DIAGNOSIS — I340 Nonrheumatic mitral (valve) insufficiency: Secondary | ICD-10-CM | POA: Diagnosis not present

## 2024-09-15 DIAGNOSIS — E118 Type 2 diabetes mellitus with unspecified complications: Secondary | ICD-10-CM | POA: Insufficient documentation

## 2024-09-15 DIAGNOSIS — I5032 Chronic diastolic (congestive) heart failure: Secondary | ICD-10-CM | POA: Insufficient documentation

## 2024-09-15 DIAGNOSIS — I152 Hypertension secondary to endocrine disorders: Secondary | ICD-10-CM | POA: Insufficient documentation

## 2024-09-15 DIAGNOSIS — Z794 Long term (current) use of insulin: Secondary | ICD-10-CM | POA: Insufficient documentation

## 2024-09-15 DIAGNOSIS — E1159 Type 2 diabetes mellitus with other circulatory complications: Secondary | ICD-10-CM | POA: Diagnosis not present

## 2024-09-15 DIAGNOSIS — E663 Overweight: Secondary | ICD-10-CM | POA: Insufficient documentation

## 2024-09-15 DIAGNOSIS — I2510 Atherosclerotic heart disease of native coronary artery without angina pectoris: Secondary | ICD-10-CM | POA: Diagnosis not present

## 2024-09-15 NOTE — Patient Instructions (Addendum)
 Medication Instructions:  Your physician recommends that you continue on your current medications as directed. Please refer to the Current Medication list given to you today.  *If you need a refill on your cardiac medications before your next appointment, please call your pharmacy*  Follow-Up: At [facility], you and your health needs are our priority.  As part of our continuing mission to provide you with exceptional heart care, our providers are all part of one team.  This team includes your primary Cardiologist (physician) and Advanced Practice Providers or APPs (Physician Assistants and Nurse Practitioners) who all work together to provide you with the care you need, when you need it.  Your next appointment:   3 month(s)  Provider:   Gayatri A Acharya, MD    We recommend signing up for the patient portal called MyChart.  Sign up information is provided on this After Visit Summary.  MyChart is used to connect with patients for Virtual Visits (Telemedicine).  Patients are able to view lab/test results, encounter notes, upcoming appointments, etc.  Non-urgent messages can be sent to your provider as well.   To learn more about what you can do with MyChart, go to [email or web address].

## 2024-09-15 NOTE — Progress Notes (Signed)
 Cardiology Office Note:  .   Date:  09/15/2024  ID:  Savannah [name], DOB June 30, 1956, MRN [id number] PCP: Kennyth Worth HERO, MD  [facility] Providers Cardiologist:  Soyla DELENA Merck, MD Cardiology APP:  Emelia Josefa HERO, NP    History of Present Illness: Savannah [name] is a 68 y.o. female.  Discussed the use of AI scribe software for clinical note transcription with the patient, who gave verbal consent to proceed.  History of Present Illness Savannah [name] is a 68 year old female with severe mitral valve regurgitation who presents with shortness of breath and fatigue. Complex medical history including diabetes with labile BG and symptoms, coronary artery disease s/p PCI, gastric bypass surgery with fluctuating weights.   She experiences shortness of breath and extreme fatigue, with variability in severity. Symptoms occur during minimal activities such as talking or sitting, and physical activities like walking are challenging. Lasix  40 mg daily has reduced leg swelling but not improved her shortness of breath. Urine output varies with fluid intake.  Echocardiogram and TEE confirm severe mitral valve regurgitation. Right heart catheterization and MRI did not show significant regurgitation at the time, however we had already started diuresis prior to these tests. She has coronary artery disease with a stent placed in 2023. She experiences low blood pressure causing faintness. Past palpitations were attributed to PVCs, and she avoids caffeine and chocolate. She is concerned about potential rhythm issues but has not been diagnosed with atrial fibrillation.  Her diet is high in sodium, and she consumes pre-packaged meals. She has gained weight since starting insulin  therapy over a year ago and wishes to discontinue it. She quit smoking in the mid-1990s.  Her main concern today is discrepancy in testing for MR. I shared with her that at the time of her echo and TEE, she was not on Lasix , and  mitral regurgitation given that it is functional appeared worse.  After diuresing appropriately, not unlikely that the mitral regurgitation improved and V waves were unimpressive on cath and MRI showed mild mitral valve regurgitation.  I have independently reviewed the cath tracings and agree, as well as independently reviewed the MRI findings.  We discussed that mitral regurgitation that is functional is also likely dynamic and dependent on fluctuations in blood pressure as well as volume loading.  While her volume status has improved on Lasix  she continues to consume salt and her mitral regurgitation may vary day-to-day.  She firmly contests that her blood pressure has not been elevated lately despite episodic elevated blood pressures while she was traveling initially prompting our review into her mitral regurgitation.  She is seeking a second opinion, I am happy to coordinate this.   ROS: negative except per HPI above.  Studies Reviewed: .        Results RADIOLOGY Cardiac MRI: Mild mitral valve regurgitation  DIAGNOSTIC Transesophageal Echocardiogram (TEE): Severe mitral valve regurgitation Nuclear Stress Test: Normal Right Heart Catheterization: No significant mitral regurgitation V wave Risk Assessment/Calculations:       Physical Exam:   VS:  BP 112/68   Pulse 73   Ht 5' 11 (1.803 m)   Wt 215 lb 3.2 oz (97.6 kg)   SpO2 96%   BMI 30.01 kg/m    Wt Readings from Last 3 Encounters:  09/23/24 214 lb (97.1 kg)  09/15/24 215 lb 3.2 oz (97.6 kg)  08/06/24 205 lb (93 kg)     Physical Exam GENERAL: Alert, cooperative, well developed,  no acute distress. HEENT: Normocephalic, normal oropharynx, moist mucous membranes. CHEST: Clear to auscultation bilaterally. No wheezes, rhonchi, or crackles. CARDIOVASCULAR: Normal heart rate and rhythm, S1 and S2 normal. Mitral regurgitation auscultated, likely moderate. ABDOMEN: Soft, non-tender, non-distended, without organomegaly. Normal bowel  sounds. EXTREMITIES: No cyanosis or edema. NEUROLOGICAL: Cranial nerves grossly intact. Moves all extremities without gross motor or sensory deficit.   ASSESSMENT AND PLAN: .    Assessment and Plan Assessment & Plan Severe dynamic mitral valve regurgitation, atrial functional LA dilation Severe mitral valve regurgitation with what I believe are dynamic changes, influenced by blood pressure and volume status. Current management has helped, but patient continues to have symptoms. Consideration for surgical intervention or clip placement if medical management fails. - Continue Lasix  40 mg daily. - Monitor blood pressure regularly. - Track daily weights and salt intake.  Needs to cut out prepackaged foods given salt load. - Referred to [facility] for a second opinion on mitral valve regurgitation management per patient preference.I will refer the patient to Dr. Prentice Flatten for his expertise. - Consider bicycle exercise stress test to evaluate mitral regurgitation during exertion.  I have offered this to the patient. - Consider heart monitor to assess for atrial fibrillation. -Per patient preference, prior to proceeding with any additional testing, she will seek out second opinion from [facility].  Heart failure with preserved ejection fraction (HFpEF) HFpEF with symptoms likely exacerbated by volume overload and mitral regurgitation. Management focuses on volume control and blood pressure management. - Continue Lasix  40 mg daily. - Monitor daily weights and salt intake. - Encouraged moderate physical activity, such as walking.  Chronic lower extremity edema, multifactorial Likely related to volume overload and heart failure. Managed with diuretics. - Continue Lasix  40 mg daily. - Monitor daily weights to assess fluid status.  Type 2 diabetes mellitus with hyperglycemia Type 2 diabetes with hyperglycemia, managed with insulin . Reports weight gain since starting insulin  and desires to discontinue  it. - Discuss with primary care provider about potential insulin  discontinuation. - Encouraged dietary modifications to improve glycemic control.  Overweight, hx gastric bypass surgery, BMI 29.85 Contributing to overall health issues, including heart failure and diabetes. Weight gain noted since starting insulin . - Encouraged dietary modifications to reduce sodium intake and improve overall nutrition. - Encouraged regular physical activity, such as walking, to aid in weight management.  History of coronary artery disease, status post stent placement Coronary artery disease with previous stent placement. No current symptoms suggestive of ischemia.  I spent 40 minutes in the care of Savannah [name] today including reviewing labs (08/06/24), reviewing studies (cath tracings, echo, TEE, MRI), face to face time discussing treatment options (31 min), and documenting in the encounter.       Soyla Merck, MD, FACC

## 2024-09-17 MED ORDER — POTASSIUM CHLORIDE CRYS ER 20 MEQ PO TBCR: 20.0000 meq | EXTENDED_RELEASE_TABLET | Freq: Every day | ORAL | 3 refills | Status: DC

## 2024-09-17 MED ORDER — FUROSEMIDE 20 MG PO TABS: 40.0000 mg | ORAL_TABLET | Freq: Every day | ORAL | 3 refills | Status: DC

## 2024-09-17 NOTE — Addendum Note (Signed)
 Addended by: CHAUVIGNE, Kerrilyn Azbill on: 09/17/2024 04:57 PM   Modules accepted: Orders

## 2024-09-18 NOTE — Progress Notes (Signed)
 [facility] OFFICE PROGRESS NOTE  Kennyth Worth HERO, MD [address] [place] [address]  DIAGNOSIS: Iron  deficiency anemia and leukopenia, history of gastric bypass surgery and likely iron  deficiency anemia secondary to malabsorption.  PRIOR THERAPY: IV iron  PRN, the most recent being on 07/08/24 with Feraheme   CURRENT THERAPY: None  INTERVAL HISTORY: Savannah [name] 68 y.o. female returns to the clinic today for a follow-up visit. The patient establish care in the clinic on 06/29/2024.  The patient has a history of gastric bypass surgery and has malabsorption secondary to this.  She received IV iron  infusions with Feraheme  on 07/08/2024 which she tolerated well.  She does not take a iron  supplement but does take a multivitamin, althought it does not have iron .  She had an upper endoscopy performed in 2024 which did not show any concerns for bleeding. She is going to reach out to gastroenterology due to being due for colonoscopy but she states she has been very busy recently with some appointments with cardiology. She states she had a TEE and cardiac MRI.   She denies any epistaxis, hemoptysis, hematemesis, melena, or hematochezia. She does have a predisposition for easy bruising and senile purpura.  Her father had MDS and she had an aunt who she believes had CLL.  She struggles with lymphedema due to history of uterine cancer.     She is on plavix  for her CAD.  She denies any dietary restrictions. She does eat red meat. She is no longer craving ice chips.   She still continues to have poor energy.  She does still get dyspnea on exertion. She is here today for evaluation and repeat blood work.   MEDICAL HISTORY: Past Medical History:  Diagnosis Date   ABDOMINAL PAIN, CHRONIC 10/20/2009   Acute cystitis 08/17/2009   ALLERGIC RHINITIS 11/21/2007   Allergy     ASYMPTOMATIC POSTMENOPAUSAL STATUS 09/29/2008   Bariatric surgery status 07/07/2010   Bariatric surgery status  07/07/2010   Qualifier: Diagnosis of  By: Kassie MD, Sean A    Cancer [facility])    uterine   Cataract    Coronary artery disease    DEPRESSION 11/21/2007   DIABETES MELLITUS, TYPE II 07/08/2007   DYSPNEA 05/20/2009   Edema 05/20/2009   Eosinophilic esophagitis 02/13/2008   FEVER UNSPECIFIED 10/20/2009   FEVER, HX OF 03/09/2010   GERD (gastroesophageal reflux disease)    Headache(784.0) 02/06/2010   Hepatomegaly 01/06/2008   HYPERLIPIDEMIA 07/08/2007   HYPERTENSION 07/08/2007   Kidney disease    followed by nephrology, per patient   LEUKOPENIA, MILD 09/29/2008   Lymphedema    left leg, dx approximately in 03/2023 per patient   Melanoma in situ of left upper arm (HCC) 04/03/2023   OBESITY 01/06/2008   OSTEOARTHRITIS 01/06/2008   Other chronic nonalcoholic liver disease 01/06/2008   PERIPHERAL NEUROPATHY 11/21/2007   Peripheral neuropathy    Postsurgical hypothyroidism 09/19/2010   SINUSITIS- ACUTE-NOS 11/21/2007   TB SKIN TEST, POSITIVE 02/06/2010   THYROID  NODULE 03/09/2010    ALLERGIES:  is allergic to ace inhibitors, caffeine, ciprofloxacin, morphine  and codeine, mucinex [guaifenesin er], nsaids, silicone, and tape.  MEDICATIONS:  Current Outpatient Medications  Medication Sig Dispense Refill   acetaminophen  (TYLENOL ) 500 MG tablet Take 2 tablets (1,000 mg total) by mouth every 6 (six) hours as needed for mild pain or fever. 30 tablet 0   atorvastatin  (LIPITOR) 40 MG tablet Take 1 tablet (40 mg total) by mouth daily. 90 tablet 3  blood glucose meter kit and supplies KIT Use up to three  times daily as directed. 1 each 0   Blood Glucose Monitoring Suppl DEVI 1 each by Does not apply route in the morning, at noon, and at bedtime. May substitute to any manufacturer covered by patient's insurance. 1 each 0   Cholecalciferol (VITAMIN D ) 50 MCG (2000 UT) tablet Take 2,000 Units by mouth every evening.     clopidogrel  (PLAVIX ) 75 MG tablet Take 1 tablet (75 mg total) by mouth  daily. 90 tablet 2   Colchicine  (MITIGARE ) 0.6 MG CAPS Take 0.6 mg by mouth daily as needed (gout pain).     Continuous Glucose Sensor (DEXCOM G7 SENSOR) MISC 1 Device by Does not apply route See admin instructions. 1 each 0   diclofenac  sodium (VOLTAREN ) 1 % GEL Apply 4 g topically 4 (four) times daily. (Patient taking differently: Apply 4 g topically daily as needed (pain).) 100 g 11   Dulaglutide  (TRULICITY ) 4.5 MG/0.5ML SOAJ Inject 4.5 mg as directed once a week. 6 mL 1   furosemide  (LASIX ) 20 MG tablet Take 2 tablets (40 mg total) by mouth daily. 90 tablet 3   gabapentin  (NEURONTIN ) 600 MG tablet TAKE 2 TABLETS BY MOUTH THREE TIMES DAILY 180 tablet 5   Glucagon  (BAQSIMI  ONE PACK) 3 MG/DOSE POWD Place 1 Device into the nose as needed (Low blood sugar with impaired consciousness). 2 each 3   halobetasol (ULTRAVATE) 0.05 % ointment Apply 1 Application topically 3 (three) times a week.     insulin  glargine (LANTUS  SOLOSTAR) 100 UNIT/ML Solostar Pen Inject 25 Units into the skin daily. 15 mL 1   insulin  lispro (HUMALOG ) 100 UNIT/ML KwikPen Inject 15 units with break fast and 15 units with lunch, 20-22 with supper 15 min before meals (Patient taking differently: 15-22 Units See admin instructions. Inject 15 units with break fast and 15 units with lunch, 20-22 with supper 15 min before meals Sliding scale) 30 mL 2   Insulin  Pen Needle (BD PEN NEEDLE MINI ULTRAFINE) 31G X 5 MM MISC Use as directed with insulin . 100 each 11   levocetirizine (XYZAL ) 5 MG tablet TAKE 1 TABLET EVERY EVENING (Patient taking differently: Take 5 mg by mouth in the morning.) 90 tablet 3   levothyroxine  (SYNTHROID ) 125 MCG tablet Take 1 tablet by mouth once daily 90 tablet 0   lidocaine  (LIDODERM ) 5 % PLACE 1 PATCH ONTO THE SKIN DAILY. REMOVE & DISCARD PATCH WITHIN 12 HOURS OR AS DIRECTED BY MD (Patient taking differently: Place 1 patch onto the skin daily as needed (pain). Remove & Discard patch within 12 hours or as directed by  MD) 30 patch 0   lipase/protease/amylase (CREON ) 36000 UNITS CPEP capsule Take 2 capsules by mouth with meals and 1 with snack. 210 capsule 3   Magnesium 200 MG TABS Take 600 tablets by mouth daily as needed (foot cramps).     methocarbamol (ROBAXIN) 750 MG tablet Take 750 mg by mouth every 6 (six) hours as needed.     Multiple Vitamin (MULTIVITAMIN) tablet Take 1 tablet by mouth in the morning and at bedtime.     nitroGLYCERIN  (NITROSTAT ) 0.4 MG SL tablet Place 1 tablet (0.4 mg total) under the tongue every 5 (five) minutes as needed for chest pain. 25 tablet 4   pantoprazole  (PROTONIX ) 40 MG tablet Take 1 tablet by mouth twice daily 180 tablet 0   phenazopyridine  (PYRIDIUM ) 100 MG tablet Take 1 tablet (100 mg total) by mouth 3 (  three) times daily with meals. (Patient taking differently: Take 100 mg by mouth 3 (three) times daily as needed for pain.) 9 tablet 0   potassium chloride  SA (KLOR-CON  M) 20 MEQ tablet Take 1 tablet (20 mEq total) by mouth daily. 90 tablet 3   traMADol  (ULTRAM ) 50 MG tablet Take 50 mg by mouth every 6 (six) hours as needed (pain).     triamcinolone cream (KENALOG) 0.1 % Apply 1 Application topically 2 (two) times daily.     No current facility-administered medications for this visit.    SURGICAL HISTORY:  Past Surgical History:  Procedure Laterality Date   ABDOMINAL HYSTERECTOMY     BASAL CELL CARCINOMA EXCISION     CARDIAC CATHETERIZATION     CHOLECYSTECTOMY N/A 05/11/2021   Procedure: LAPAROSCOPIC CHOLECYSTECTOMY WITH POSSIBLE  INTRAOPERATIVE CHOLANGIOGRAM;  Surgeon: Gladis Cough, MD;  Location: [facility];  Service: General;  Laterality: N/A;   COLONOSCOPY     CORONARY ATHERECTOMY N/A 05/03/2022   Procedure: CORONARY ATHERECTOMY;  Surgeon: Wendel Lurena POUR, MD;  Location: [facility];  Service: Cardiovascular;  Laterality: N/A;   CORONARY IMAGING/OCT N/A 05/03/2022   Procedure: INTRAVASCULAR IMAGING/OCT;  Surgeon: Wendel Lurena POUR, MD;  Location: [facility];  Service: Cardiovascular;  Laterality: N/A;   CORONARY PRESSURE/FFR STUDY N/A 05/03/2022   Procedure: INTRAVASCULAR PRESSURE WIRE/FFR STUDY;  Surgeon: Wendel Lurena POUR, MD;  Location: [facility];  Service: Cardiovascular;  Laterality: N/A;   CORONARY STENT INTERVENTION N/A 05/03/2022   Procedure: CORONARY STENT INTERVENTION;  Surgeon: Wendel Lurena POUR, MD;  Location: [facility];  Service: Cardiovascular;  Laterality: N/A;  lad   EYE SURGERY Bilateral 08/2018, 09/2018   FOOT SURGERY Left    10/2019   KNEE ARTHROSCOPY Left    LEFT HEART CATH AND CORONARY ANGIOGRAPHY N/A 01/20/2019   Procedure: LEFT HEART CATH AND CORONARY ANGIOGRAPHY;  Surgeon: Claudene Victory ORN, MD;  Location: [facility];  Service: Cardiovascular;  Laterality: N/A;   LYMPH NODE DISSECTION     OOPHORECTOMY     Right total knee replacement     RIGHT/LEFT HEART CATH AND CORONARY ANGIOGRAPHY N/A 05/03/2022   Procedure: RIGHT/LEFT HEART CATH AND CORONARY ANGIOGRAPHY;  Surgeon: Wendel Lurena POUR, MD;  Location: [facility];  Service: Cardiovascular;  Laterality: N/A;   RIGHT/LEFT HEART CATH AND CORONARY ANGIOGRAPHY N/A 08/06/2024   Procedure: RIGHT/LEFT HEART CATH AND CORONARY ANGIOGRAPHY;  Surgeon: Wendel Lurena POUR, MD;  Location: [facility];  Service: Cardiovascular;  Laterality: N/A;   ROUX-EN-Y GASTRIC BYPASS  2011   SKIN TAG REMOVAL     TEE WITH CARDIOVERSION     THYROIDECTOMY     TONSILLECTOMY AND ADENOIDECTOMY     TOOTH EXTRACTION  2023   TRANSESOPHAGEAL ECHOCARDIOGRAM (CATH LAB) N/A 07/06/2024   Procedure: TRANSESOPHAGEAL ECHOCARDIOGRAM;  Surgeon: Loni Soyla LABOR, MD;  Location: [facility];  Service: Cardiovascular;  Laterality: N/A;   UPPER GASTROINTESTINAL ENDOSCOPY     UPPER GI ENDOSCOPY  2024    REVIEW OF SYSTEMS:   Review of Systems  Constitutional: Positive for fatigue. Negative for appetite change, chills,fever and unexpected weight change.  HENT:  Negative for mouth sores, nosebleeds, sore throat and trouble swallowing.   Eyes: Negative for eye problems and icterus.  Respiratory: Positive for shortness of breath with exertion. Negative for cough, hemoptysis, and wheezing.   Cardiovascular: Negative for chest pain. Positive for lymphedema.  Gastrointestinal: Abdominal discomfort/diarrhea. Genitourinary: Negative  for bladder incontinence, difficulty urinating, dysuria, frequency and hematuria.   Musculoskeletal: Negative for back pain, gait problem, neck pain and neck stiffness.  Skin: Negative for itching and rash.  Neurological: Negative for dizziness, extremity weakness, gait problem, Hematological: Positive for bruising on upper arms bilaterally. Negative for adenopathy.  Psychiatric/Behavioral: Negative for confusion, depression and sleep disturbance. The patient is not nervous/anxious.     PHYSICAL EXAMINATION:  There were no vitals taken for this visit.  ECOG PERFORMANCE STATUS: 1  Physical Exam  Constitutional: Oriented to person, place, and time and well-developed, well-nourished, and in no distress.  HENT:  Head: Normocephalic and atraumatic.  Mouth/Throat: Oropharynx is clear and moist. No oropharyngeal exudate.  Eyes: Conjunctivae are normal. Right eye exhibits no discharge. Left eye exhibits no discharge. No scleral icterus.  Neck: Normal range of motion. Neck supple.  Cardiovascular: Normal rate, regular rhythm, normal heart sounds and intact distal pulses.   Pulmonary/Chest: Effort normal and breath sounds normal. No respiratory distress. No wheezes. No rales.  Abdominal: Soft. Bowel sounds are normal. Exhibits no distension and no mass. There is no tenderness.  Musculoskeletal: Positive for bilaterally lower extremity swelling. Normal range of motion.  Lymphadenopathy:    No cervical adenopathy.  Neurological: Alert and oriented to person, place, and time. Exhibits normal muscle tone. Gait normal. Coordination  normal.  Skin: Positive for bruising on upper extremities bilaterally. Skin is warm and dry. No rash noted. Not diaphoretic. No erythema. No pallor.  Psychiatric: Mood, memory and judgment normal.  Vitals reviewed.  LABORATORY DATA: Lab Results  Component Value Date   WBC 4.2 07/27/2024   HGB 10.5 (L) 08/06/2024   HCT 31.0 (L) 08/06/2024   MCV 97 07/27/2024   PLT 196 07/27/2024      Chemistry      Component Value Date/Time   NA 142 08/06/2024 1148   NA 141 07/27/2024 1056   K 4.0 08/06/2024 1148   CL 105 07/27/2024 1056   CO2 20 07/27/2024 1056   BUN 32 (H) 07/27/2024 1056   CREATININE 1.62 (H) 07/27/2024 1056   CREATININE 1.51 (H) 02/22/2023 1513      Component Value Date/Time   CALCIUM  8.7 07/27/2024 1056   CALCIUM  8.9 01/30/2013 1017   ALKPHOS 173 (H) 05/07/2024 1243   AST 52 (H) 05/07/2024 1243   AST 25 02/29/2020 1303   ALT 75 (H) 05/07/2024 1243   ALT 37 02/29/2020 1303   BILITOT 0.3 05/07/2024 1243   BILITOT 0.4 10/09/2021 1214   BILITOT 0.4 02/29/2020 1303       RADIOGRAPHIC STUDIES:  MR CARDIAC MORPHOLOGY W WO CONTRAST Result Date: 08/30/2024 CLINICAL DATA:  Mitral Regurgitation EXAM: CARDIAC MRI TECHNIQUE: The patient was scanned on a 1.5 Tesla Siemens magnet. A dedicated cardiac coil was used. Functional imaging was done using Fiesta sequences. 2,3, and 4 chamber views were done to assess for RWMA's. Modified Simpson's rule using a short axis stack was used to calculate an ejection fraction on a dedicated work [employer]. The patient received 10 cc of Gadavist  after 10 minutes inversion recovery sequences were used to assess for infiltration and scar tissue. CONTRAST:  Gadavist  FINDINGS: Severe LAE. Miidl RAE. No ASD/PFO. No pericardial effusion Normal tri leaflet AV. Normal ascending thoracic aorta 3.4 cm. Normal RV. Normal TV/PV. Mitral valve has thickened leaflet tips. Dilated medial lateral annulus 4.0 cm. Central MR directed  posteriorly. Using total LV stroke volume from LV endocardial tracings of 80 cc and forward flow  from aortic phase analysis of 78 cc gives a regurgitant fraction of only 3% on current study. The near equivalent SV for RV/LV would also suggest low regurgitant volume burden. Looking at the flow through the PV compared to AV on phase analysis 72 cc/79 cc also suggests mild MR On cine true fisp image the phase disturbance in the LA also appears mild Quantitative LVEF: 64% (EDV 126 cc ESV 46 cc SV 80 cc) Estimated cardiac output using flow analysis 5.9 L/min Quantitative RVEF: 56% (EDV 141 cc ESV 62 cc SV 79 cc) Estimated cardiac output through PV is 5.2 L/min Delayed enhancement images with gadolinium are normal with no infarct, infiltration or scar Parametric Measures: Using Hct 38 T1:normal 1015 msec ECV: Minimally elevated 30% T2: Normal 51 msec IMPRESSION: 1. Current study suggests more mild functional atrial MR. MR may be dynamic. Review of TEE suggests moderate MR with PISA radius 0.6 cm Mild dilatation of the mitral annulus in the medial lateral dimension 4.0 cm. Current study is more in line with lack of V waves on cath. Doubt high LA compliance given how enlarged it is 2.  Normal LV size and function LVEF 64% 3.  Normal cardiac output 5.9 L/min 4.  Normal RV size and function RVEF 56% 5.  Mildly elevated ECV not in infiltrative range with normal T1/T2 6. Delayed gadolinium images with no scar, infarct or infiltration and normal nulling 7.  Severe LAE mild RAE Maude Emmer Electronically Signed   By: Maude Emmer M.D.   On: 08/30/2024 10:48   MR CARDIAC VELOCITY FLOW MAP Result Date: 08/30/2024 CLINICAL DATA:  Mitral Regurgitation EXAM: CARDIAC MRI TECHNIQUE: The patient was scanned on a 1.5 Tesla Siemens magnet. A dedicated cardiac coil was used. Functional imaging was done using Fiesta sequences. 2,3, and 4 chamber views were done to assess for RWMA's. Modified Simpson's rule using a short axis stack was  used to calculate an ejection fraction on a dedicated work [employer]. The patient received 10 cc of Gadavist  after 10 minutes inversion recovery sequences were used to assess for infiltration and scar tissue. CONTRAST:  Gadavist  FINDINGS: Severe LAE. Miidl RAE. No ASD/PFO. No pericardial effusion Normal tri leaflet AV. Normal ascending thoracic aorta 3.4 cm. Normal RV. Normal TV/PV. Mitral valve has thickened leaflet tips. Dilated medial lateral annulus 4.0 cm. Central MR directed posteriorly. Using total LV stroke volume from LV endocardial tracings of 80 cc and forward flow from aortic phase analysis of 78 cc gives a regurgitant fraction of only 3% on current study. The near equivalent SV for RV/LV would also suggest low regurgitant volume burden. Looking at the flow through the PV compared to AV on phase analysis 72 cc/79 cc also suggests mild MR On cine true fisp image the phase disturbance in the LA also appears mild Quantitative LVEF: 64% (EDV 126 cc ESV 46 cc SV 80 cc) Estimated cardiac output using flow analysis 5.9 L/min Quantitative RVEF: 56% (EDV 141 cc ESV 62 cc SV 79 cc) Estimated cardiac output through PV is 5.2 L/min Delayed enhancement images with gadolinium are normal with no infarct, infiltration or scar Parametric Measures: Using Hct 38 T1:normal 1015 msec ECV: Minimally elevated 30% T2: Normal 51 msec IMPRESSION: 1. Current study suggests more mild functional atrial MR. MR may be dynamic. Review of TEE suggests moderate MR with PISA radius 0.6 cm Mild dilatation of the mitral annulus in the medial lateral dimension 4.0 cm. Current study is more in line with  lack of V waves on cath. Doubt high LA compliance given how enlarged it is 2.  Normal LV size and function LVEF 64% 3.  Normal cardiac output 5.9 L/min 4.  Normal RV size and function RVEF 56% 5.  Mildly elevated ECV not in infiltrative range with normal T1/T2 6. Delayed gadolinium images with no scar, infarct or  infiltration and normal nulling 7.  Severe LAE mild RAE Maude Emmer Electronically Signed   By: Maude Emmer M.D.   On: 08/30/2024 10:48   MR CARDIAC VELOCITY FLOW MAP Result Date: 08/30/2024 CLINICAL DATA:  Mitral Regurgitation EXAM: CARDIAC MRI TECHNIQUE: The patient was scanned on a 1.5 Tesla Siemens magnet. A dedicated cardiac coil was used. Functional imaging was done using Fiesta sequences. 2,3, and 4 chamber views were done to assess for RWMA's. Modified Simpson's rule using a short axis stack was used to calculate an ejection fraction on a dedicated work [employer]. The patient received 10 cc of Gadavist  after 10 minutes inversion recovery sequences were used to assess for infiltration and scar tissue. CONTRAST:  Gadavist  FINDINGS: Severe LAE. Miidl RAE. No ASD/PFO. No pericardial effusion Normal tri leaflet AV. Normal ascending thoracic aorta 3.4 cm. Normal RV. Normal TV/PV. Mitral valve has thickened leaflet tips. Dilated medial lateral annulus 4.0 cm. Central MR directed posteriorly. Using total LV stroke volume from LV endocardial tracings of 80 cc and forward flow from aortic phase analysis of 78 cc gives a regurgitant fraction of only 3% on current study. The near equivalent SV for RV/LV would also suggest low regurgitant volume burden. Looking at the flow through the PV compared to AV on phase analysis 72 cc/79 cc also suggests mild MR On cine true fisp image the phase disturbance in the LA also appears mild Quantitative LVEF: 64% (EDV 126 cc ESV 46 cc SV 80 cc) Estimated cardiac output using flow analysis 5.9 L/min Quantitative RVEF: 56% (EDV 141 cc ESV 62 cc SV 79 cc) Estimated cardiac output through PV is 5.2 L/min Delayed enhancement images with gadolinium are normal with no infarct, infiltration or scar Parametric Measures: Using Hct 38 T1:normal 1015 msec ECV: Minimally elevated 30% T2: Normal 51 msec IMPRESSION: 1. Current study suggests more mild functional atrial MR.  MR may be dynamic. Review of TEE suggests moderate MR with PISA radius 0.6 cm Mild dilatation of the mitral annulus in the medial lateral dimension 4.0 cm. Current study is more in line with lack of V waves on cath. Doubt high LA compliance given how enlarged it is 2.  Normal LV size and function LVEF 64% 3.  Normal cardiac output 5.9 L/min 4.  Normal RV size and function RVEF 56% 5.  Mildly elevated ECV not in infiltrative range with normal T1/T2 6. Delayed gadolinium images with no scar, infarct or infiltration and normal nulling 7.  Severe LAE mild RAE Maude Emmer Electronically Signed   By: Maude Emmer M.D.   On: 08/30/2024 10:48     ASSESSMENT/PLAN:  This is a very pleasant 68 year old Caucasian female with prior urgency anemia she also has a longstanding history of leukopenia she had a bone marrow biopsy and aspirate performed in 2021. The patient also has a gastric bypass surgery and likely has iron  deficiency anemia malabsorption. She has received IV iron  as needed the most recent being in August 2025.   Repeat CBC, iron  studies, ferritin drawn today.  Her hemoglobin is 11.7.  Her MCV is normal.  Her iron  studies are  within normal limits.  Her ferritin is still pending.  She does not take any iron  supplements due to her history of gastric bypass and malabsorption.  I do not think she will require any IV iron  at this time but if her ferritin comes back low we may consider IV iron .  Otherwise we will continue to monitor her closely and arrange for repeat labs and follow-up visit in 3 months.  She was advised to reach out to gastroenterology if she is due for colonoscopy to ensure that this gets scheduled.  Continue to monitor her for her leukopenia.  If she requires IV iron  I will arrange for this at the [facility] infusion center.   The patient was advised to call immediately if she has any concerning symptoms in the interval. The patient voices understanding of current  disease status and treatment options and is in agreement with the current care plan. All questions were answered. The patient knows to call the clinic with any problems, questions or concerns. We can certainly see the patient much sooner if necessary   No orders of the defined types were placed in this encounter.    The total time spent in the appointment was 20-29 minutes  Shanquita Ronning L Gaynor Genco, PA-C 09/18/24

## 2024-09-21 ENCOUNTER — Inpatient Hospital Stay (HOSPITAL_BASED_OUTPATIENT_CLINIC_OR_DEPARTMENT_OTHER): Admitting: Physician Assistant

## 2024-09-21 ENCOUNTER — Inpatient Hospital Stay: Attending: Physician Assistant

## 2024-09-21 VITALS — BP 137/66 | HR 71 | Temp 97.1°F | Resp 17 | Ht 71.0 in

## 2024-09-21 DIAGNOSIS — D509 Iron deficiency anemia, unspecified: Secondary | ICD-10-CM | POA: Diagnosis not present

## 2024-09-21 DIAGNOSIS — I890 Lymphedema, not elsewhere classified: Secondary | ICD-10-CM | POA: Diagnosis not present

## 2024-09-21 DIAGNOSIS — D72819 Decreased white blood cell count, unspecified: Secondary | ICD-10-CM | POA: Insufficient documentation

## 2024-09-21 DIAGNOSIS — R0602 Shortness of breath: Secondary | ICD-10-CM | POA: Diagnosis not present

## 2024-09-21 DIAGNOSIS — E1122 Type 2 diabetes mellitus with diabetic chronic kidney disease: Secondary | ICD-10-CM | POA: Diagnosis not present

## 2024-09-21 DIAGNOSIS — Z8542 Personal history of malignant neoplasm of other parts of uterus: Secondary | ICD-10-CM | POA: Diagnosis not present

## 2024-09-21 DIAGNOSIS — N1832 Chronic kidney disease, stage 3b: Secondary | ICD-10-CM | POA: Diagnosis not present

## 2024-09-21 DIAGNOSIS — Z9884 Bariatric surgery status: Secondary | ICD-10-CM | POA: Diagnosis not present

## 2024-09-21 LAB — IRON AND IRON BINDING CAPACITY (CC-WL,HP ONLY)
Iron: 108 ug/dL (ref 28–170)
Saturation Ratios: 27 % (ref 10.4–31.8)
TIBC: 400 ug/dL (ref 250–450)
UIBC: 292 ug/dL (ref 148–442)

## 2024-09-21 LAB — FERRITIN: Ferritin: 52 ng/mL (ref 11–307)

## 2024-09-21 LAB — CBC WITH DIFFERENTIAL (CANCER CENTER ONLY)
Abs Immature Granulocytes: 0 K/uL (ref 0.00–0.07)
Basophils Absolute: 0 K/uL (ref 0.0–0.1)
Basophils Relative: 1 %
Eosinophils Absolute: 0.3 K/uL (ref 0.0–0.5)
Eosinophils Relative: 9 %
HCT: 35.7 % — ABNORMAL LOW (ref 36.0–46.0)
Hemoglobin: 11.7 g/dL — ABNORMAL LOW (ref 12.0–15.0)
Immature Granulocytes: 0 %
Lymphocytes Relative: 30 %
Lymphs Abs: 1.1 K/uL (ref 0.7–4.0)
MCH: 32 pg (ref 26.0–34.0)
MCHC: 32.8 g/dL (ref 30.0–36.0)
MCV: 97.5 fL (ref 80.0–100.0)
Monocytes Absolute: 0.4 K/uL (ref 0.1–1.0)
Monocytes Relative: 11 %
Neutro Abs: 1.8 K/uL (ref 1.7–7.7)
Neutrophils Relative %: 49 %
Platelet Count: 174 K/uL (ref 150–400)
RBC: 3.66 MIL/uL — ABNORMAL LOW (ref 3.87–5.11)
RDW: 15.1 % (ref 11.5–15.5)
WBC Count: 3.6 K/uL — ABNORMAL LOW (ref 4.0–10.5)
nRBC: 0 % (ref 0.0–0.2)

## 2024-09-22 ENCOUNTER — Encounter: Payer: Self-pay | Admitting: Physician Assistant

## 2024-09-22 DIAGNOSIS — I872 Venous insufficiency (chronic) (peripheral): Secondary | ICD-10-CM | POA: Diagnosis not present

## 2024-09-22 DIAGNOSIS — D485 Neoplasm of uncertain behavior of skin: Secondary | ICD-10-CM | POA: Diagnosis not present

## 2024-09-22 DIAGNOSIS — L578 Other skin changes due to chronic exposure to nonionizing radiation: Secondary | ICD-10-CM | POA: Diagnosis not present

## 2024-09-22 DIAGNOSIS — L900 Lichen sclerosus et atrophicus: Secondary | ICD-10-CM | POA: Diagnosis not present

## 2024-09-22 DIAGNOSIS — L309 Dermatitis, unspecified: Secondary | ICD-10-CM | POA: Diagnosis not present

## 2024-09-22 DIAGNOSIS — D2271 Melanocytic nevi of right lower limb, including hip: Secondary | ICD-10-CM | POA: Diagnosis not present

## 2024-09-22 DIAGNOSIS — L570 Actinic keratosis: Secondary | ICD-10-CM | POA: Diagnosis not present

## 2024-09-22 DIAGNOSIS — L821 Other seborrheic keratosis: Secondary | ICD-10-CM | POA: Diagnosis not present

## 2024-09-22 DIAGNOSIS — D2272 Melanocytic nevi of left lower limb, including hip: Secondary | ICD-10-CM | POA: Diagnosis not present

## 2024-09-22 DIAGNOSIS — D2230 Melanocytic nevi of unspecified part of face: Secondary | ICD-10-CM | POA: Diagnosis not present

## 2024-09-22 DIAGNOSIS — D225 Melanocytic nevi of trunk: Secondary | ICD-10-CM | POA: Diagnosis not present

## 2024-09-22 DIAGNOSIS — Z85820 Personal history of malignant melanoma of skin: Secondary | ICD-10-CM | POA: Diagnosis not present

## 2024-09-23 ENCOUNTER — Other Ambulatory Visit

## 2024-09-23 ENCOUNTER — Encounter: Payer: Self-pay | Admitting: "Endocrinology

## 2024-09-23 ENCOUNTER — Ambulatory Visit (INDEPENDENT_AMBULATORY_CARE_PROVIDER_SITE_OTHER): Admitting: "Endocrinology

## 2024-09-23 VITALS — BP 122/80 | HR 80 | Ht 71.0 in | Wt 214.0 lb

## 2024-09-23 DIAGNOSIS — E1165 Type 2 diabetes mellitus with hyperglycemia: Secondary | ICD-10-CM

## 2024-09-23 DIAGNOSIS — E7800 Pure hypercholesterolemia, unspecified: Secondary | ICD-10-CM

## 2024-09-23 DIAGNOSIS — Z794 Long term (current) use of insulin: Secondary | ICD-10-CM | POA: Diagnosis not present

## 2024-09-23 DIAGNOSIS — E890 Postprocedural hypothyroidism: Secondary | ICD-10-CM | POA: Diagnosis not present

## 2024-09-23 DIAGNOSIS — Z7985 Long-term (current) use of injectable non-insulin antidiabetic drugs: Secondary | ICD-10-CM | POA: Diagnosis not present

## 2024-09-23 LAB — POCT GLYCOSYLATED HEMOGLOBIN (HGB A1C): Hemoglobin A1C: 8 % — AB (ref 4.0–5.6)

## 2024-09-23 MED ORDER — TIRZEPATIDE 5 MG/0.5ML ~~LOC~~ SOAJ: 5.0000 mg | SUBCUTANEOUS | 1 refills | Status: AC

## 2024-09-23 NOTE — Patient Instructions (Addendum)
 Will recommend the following: Farxiga 5 mg every day Start mounjaro 5 mg weekly Lantus  25 units qam  Humalog  15 units with break fast and 18 units with lunch, 24-25 with supper 15 min before meals  Humalog  Correction scale: Use in addition to your meal time/short acting insulin  based on blood sugars as follows: 201 - 225: 3 units 226 - 250: 4 units 251 - 275: 5 units 276 - 300: 6 units 301 - 325: 7 units 326 - 350: 8 units 351 - 375: 9 units 376 - 400: 10 units   _____________    Goals of DM therapy:  Morning Fasting blood sugar: 80-140  Blood sugar before meals: 80-140 Bed time blood sugar: 100-150  A1C <7%, limited only by hypoglycemia  1.Diabetes medications and their side effects discussed, including hypoglycemia    2. Check blood glucose:  a) Always check blood sugars before driving. Please see below (under hypoglycemia) on how to manage b) Check a minimum of 3 times/day or more as needed when having symptoms of hypoglycemia.   c) Try to check blood glucose before sleeping/in the middle of the night to ensure that it is remaining stable and not dropping less than 100 d) Check blood glucose more often if sick  3. Diet: a) 3 meals per day schedule b: Restrict carbs to 60-70 grams (4 servings) per meal c) Colorful vegetables - 3 servings a day, and low sugar fruit 2 servings/day Plate control method: 1/4 plate protein, 1/4 starch, 1/2 green, yellow, or red vegetables d) Avoid carbohydrate snacks unless hypoglycemic episode, or increased physical activity  4. Regular exercise as tolerated, preferably 3 or more hours a week  5. Hypoglycemia: a)  Do not drive or operate machinery without first testing blood glucose to assure it is over 90 mg%, or if dizzy, lightheaded, not feeling normal, etc, or  if foot or leg is numb or weak. b)  If blood glucose less than 70, take four 5gm Glucose tabs or 15-30 gm Glucose gel.  Repeat every 15 min as needed until blood sugar is >100  mg/dl. If hypoglycemia persists then call 911.   6. Sick day management: a) Check blood glucose more often b) Continue usual therapy if blood sugars are elevated.   7. Contact the doctor immediately if blood glucose is frequently <60 mg/dl, or an episode of severe hypoglycemia occurs (where someone had to give you glucose/  glucagon  or if you passed out from a low blood glucose), or if blood glucose is persistently >350 mg/dl, for further management  8. A change in level of physical activity or exercise and a change in diet may also affect your blood sugar. Check blood sugars more often and call if needed.  Instructions: 1. Bring glucose meter, blood glucose records on every visit for review 2. Continue to follow up with primary care physician and other providers for medical care 3. Yearly eye  and foot exam 4. Please get blood work done prior to the next appointment

## 2024-09-23 NOTE — Progress Notes (Signed)
 Outpatient Endocrinology Note Savannah Birmingham, MD  09/23/24   Savannah [name] [birth year] [id number]  Referring Provider: Kennyth Worth HERO, MD Primary Care Provider: Kennyth Worth HERO, MD Reason for consultation: Subjective   Assessment & Plan  Diagnoses and all orders for this visit:  Uncontrolled type 2 diabetes mellitus with hyperglycemia (HCC) -     POCT glycosylated hemoglobin (Hb A1C)  Long-term (current) use of injectable non-insulin  antidiabetic drugs  Long-term insulin  use (HCC)  Insulin  dose changed (HCC)  Pure hypercholesterolemia  Postoperative hypothyroidism -     TSH + free T4  Other orders -     tirzepatide (MOUNJARO) 5 MG/0.5ML Pen; Inject 5 mg into the skin once a week.    History of thyroidectomy on levothyroxine  125 mcg po every day, takes with other meds Recommend to take levothyroxine  first thing in the morning on empty stomach and wait at least 30 minutes to 1 hour before eating or drinking anything or taking any other medications. Space out levothyroxine  by 4 hours from any acid reflux medication, fibrate, iron , calcium , multivitamin, and nutritional supplements.  02/27/24: start LT4 112 mcg po qd, TSH at 8 04/15/24: on LT4 125 mcg po every day Repeat lab today   Diabetes Type II complicated by heart stents, neuropathy, nephropathy  She took insulin  from 2004-2011, when she had gastric bypass surgery Lab Results  Component Value Date   GFR 42.20 (L) 05/07/2024   Hba1c goal less than 7, current Hba1c is  Lab Results  Component Value Date   HGBA1C 8.0 (A) 09/23/2024   Will recommend the following: Farxiga 5 mg every day - started by nephrologist  Start mounjaro 5 mg weekly (lost 36 lbs with Trulicity  4.5 mg weekly but unhappy with her current weight as he gained weight back) Lantus  25 units qam  Humalog  15 units with break fast and 18 units with lunch, 24-25 with supper 15 min before meals  Humalog  Correction scale: Use in addition to your meal  time/short acting insulin  based on blood sugars as follows: 201 - 225: 3 units 226 - 250: 4 units 251 - 275: 5 units 276 - 300: 6 units 301 - 325: 7 units 326 - 350: 8 units 351 - 375: 9 units 376 - 400: 10 units   Stopped Repaglinide  2 mg tid - not helping  Stopped Invokana  300 mg every day-gets UTIs and has pelvic lichen sclerosis   Has DexCom Ordered DM education previously   No known contraindications/side effects to any of above medications No history of MEN syndrome/medullary thyroid  cancer/pancreatitis or pancreatic cancer in self or family Glucagon  discussed and prescribed with refills on 09/23/24  -Last LD and Tg are as follows: Lab Results  Component Value Date   LDLCALC 58 02/20/2024    Lab Results  Component Value Date   TRIG 94 02/20/2024   -On atorvastatin  40 mg QD -Follow low fat diet and exercise   -Blood pressure goal <140/90 - Microalbumin/creatinine goal is < 30 -Last MA/Cr is as follows: Lab Results  Component Value Date   MICROALBUR 0.7 05/07/2024   -not on ACE/ARB, sees a nephrologist  -diet changes including salt restriction -limit eating outside -counseled BP targets per standards of diabetes care -uncontrolled blood pressure can lead to retinopathy, nephropathy and cardiovascular and atherosclerotic heart disease  Reviewed and counseled on: -A1C target -Blood sugar targets -Complications of uncontrolled diabetes  -Checking blood sugar before meals and bedtime and bring log next visit -All medications with mechanism  of action and side effects -Hypoglycemia management: rule of 15's, Glucagon  Emergency Kit and medical alert ID -low-carb low-fat plate-method diet -At least 20 minutes of physical activity per day -Annual dilated retinal eye exam and foot exam -compliance and follow up needs -follow up as scheduled or earlier if problem gets worse  Call if blood sugar is less than 70 or consistently above 250    Take a 15 gm snack of  carbohydrate at bedtime before you go to sleep if your blood sugar is less than 100.    If you are going to fast after midnight for a test or procedure, ask your physician for instructions on how to reduce/decrease your insulin  dose.    Call if blood sugar is less than 70 or consistently above 250  -Treating a low sugar by rule of 15  (15 gms of sugar every 15 min until sugar is more than 70) If you feel your sugar is low, test your sugar to be sure If your sugar is low (less than 70), then take 15 grams of a fast acting Carbohydrate (3-4 glucose tablets or glucose gel or 4 ounces of juice or regular soda) Recheck your sugar 15 min after treating low to make sure it is more than 70 If sugar is still less than 70, treat again with 15 grams of carbohydrate          Don't drive the hour of hypoglycemia  If unconscious/unable to eat or drink by mouth, use glucagon  injection or nasal spray baqsimi  and call 911. Can repeat again in 15 min if still unconscious.  Return in about 4 weeks (around 10/21/2024) for visit, labs today.   I have reviewed current medications, nurse's notes, allergies, vital signs, past medical and surgical history, family medical history, and social history for this encounter. Counseled patient on symptoms, examination findings, lab findings, imaging results, treatment decisions and monitoring and prognosis. The patient understood the recommendations and agrees with the treatment plan. All questions regarding treatment plan were fully answered.  Savannah Birmingham, MD  09/23/24    History of Present Illness Savannah [name] is a 68 y.o. year old female who presents for evaluation of Type II diabetes mellitus and hypothyroidism.  Savannah [name] was first diagnosed of DM in 2000.   Diabetes education +  Home diabetes regimen: Trulicity  4.5 mg weekly Lantus  25 units qam  Humalog  15 units with break fast and 15 units with lunch, 22 with supper 15 min before meals  Correction  scale: Use in addition to your meal time/short acting insulin  based on blood sugars as follows: 201 - 225: 3 units 226 - 250: 4 units 251 - 275: 5 units 276 - 300: 6 units 301 - 325: 7 units 326 - 350: 8 units 351 - 375: 9 units 376 - 400: 10 units   COMPLICATIONS - MI/Stroke, + angina s/p heart stents  -  retinopathy +  neuropathy +  nephropathy  BLOOD SUGAR DATA CGM interpretation: At today's visit, we reviewed her CGM downloads. The full report is scanned in the media. Reviewing the CGM trends, BG are elevated between 12am-4am, and improve some thereafter.   Physical Exam  BP 122/80   Pulse 80   Ht 5' 11 (1.803 m)   Wt 214 lb (97.1 kg)   SpO2 96%   BMI 29.85 kg/m    Constitutional: well developed, well nourished Head: normocephalic, atraumatic Eyes: sclera anicteric, no redness Neck: supple Lungs: normal respiratory effort Neurology:  alert and oriented Skin: dry, no appreciable rashes Musculoskeletal: no appreciable defects Psychiatric: normal mood and affect Diabetic Foot Exam - Simple   No data filed      Current Medications Patient's Medications  New Prescriptions   TIRZEPATIDE (MOUNJARO) 5 MG/0.5ML PEN    Inject 5 mg into the skin once a week.  Previous Medications   ACETAMINOPHEN  (TYLENOL ) 500 MG TABLET    Take 2 tablets (1,000 mg total) by mouth every 6 (six) hours as needed for mild pain or fever.   ATORVASTATIN  (LIPITOR) 40 MG TABLET    Take 1 tablet (40 mg total) by mouth daily.   BLOOD GLUCOSE METER KIT AND SUPPLIES KIT    Use up to three  times daily as directed.   BLOOD GLUCOSE MONITORING SUPPL DEVI    1 each by Does not apply route in the morning, at noon, and at bedtime. May substitute to any manufacturer covered by patient's insurance.   CHOLECALCIFEROL (VITAMIN D ) 50 MCG (2000 UT) TABLET    Take 2,000 Units by mouth every evening.   CLOPIDOGREL  (PLAVIX ) 75 MG TABLET    Take 1 tablet (75 mg total) by mouth daily.   COLCHICINE  (MITIGARE ) 0.6 MG  CAPS    Take 0.6 mg by mouth daily as needed (gout pain).   CONTINUOUS GLUCOSE SENSOR (DEXCOM G7 SENSOR) MISC    1 Device by Does not apply route See admin instructions.   DICLOFENAC  SODIUM (VOLTAREN ) 1 % GEL    Apply 4 g topically 4 (four) times daily.   FUROSEMIDE  (LASIX ) 20 MG TABLET    Take 2 tablets (40 mg total) by mouth daily.   GABAPENTIN  (NEURONTIN ) 600 MG TABLET    TAKE 2 TABLETS BY MOUTH THREE TIMES DAILY   GLUCAGON  (BAQSIMI  ONE PACK) 3 MG/DOSE POWD    Place 1 Device into the nose as needed (Low blood sugar with impaired consciousness).   HALOBETASOL (ULTRAVATE) 0.05 % OINTMENT    Apply 1 Application topically 3 (three) times a week.   INSULIN  GLARGINE (LANTUS  SOLOSTAR) 100 UNIT/ML SOLOSTAR PEN    Inject 25 Units into the skin daily.   INSULIN  LISPRO (HUMALOG ) 100 UNIT/ML KWIKPEN    Inject 15 units with break fast and 15 units with lunch, 20-22 with supper 15 min before meals   INSULIN  PEN NEEDLE (BD PEN NEEDLE MINI ULTRAFINE) 31G X 5 MM MISC    Use as directed with insulin .   LEVOCETIRIZINE (XYZAL ) 5 MG TABLET    TAKE 1 TABLET EVERY EVENING   LEVOTHYROXINE  (SYNTHROID ) 125 MCG TABLET    Take 1 tablet by mouth once daily   LIDOCAINE  (LIDODERM ) 5 %    PLACE 1 PATCH ONTO THE SKIN DAILY. REMOVE & DISCARD PATCH WITHIN 12 HOURS OR AS DIRECTED BY MD   LIPASE/PROTEASE/AMYLASE (CREON ) 36000 UNITS CPEP CAPSULE    Take 2 capsules by mouth with meals and 1 with snack.   MAGNESIUM 200 MG TABS    Take 600 tablets by mouth daily as needed (foot cramps).   METHOCARBAMOL (ROBAXIN) 750 MG TABLET    Take 750 mg by mouth every 6 (six) hours as needed.   MULTIPLE VITAMIN (MULTIVITAMIN) TABLET    Take 1 tablet by mouth in the morning and at bedtime.   NITROGLYCERIN  (NITROSTAT ) 0.4 MG SL TABLET    Place 1 tablet (0.4 mg total) under the tongue every 5 (five) minutes as needed for chest pain.   PANTOPRAZOLE  (PROTONIX ) 40 MG TABLET  Take 1 tablet by mouth twice daily   PHENAZOPYRIDINE  (PYRIDIUM ) 100 MG TABLET     Take 1 tablet (100 mg total) by mouth 3 (three) times daily with meals.   POTASSIUM CHLORIDE  SA (KLOR-CON  M) 20 MEQ TABLET    Take 1 tablet (20 mEq total) by mouth daily.   TRAMADOL  (ULTRAM ) 50 MG TABLET    Take 50 mg by mouth every 6 (six) hours as needed (pain).   TRIAMCINOLONE CREAM (KENALOG) 0.1 %    Apply 1 Application topically 2 (two) times daily.  Modified Medications   No medications on file  Discontinued Medications   DULAGLUTIDE  (TRULICITY ) 4.5 MG/0.5ML SOAJ    Inject 4.5 mg as directed once a week.    Allergies Allergies  Allergen Reactions   Ace Inhibitors Cough   Caffeine     PVCs   Ciprofloxacin Itching   Morphine  And Codeine Itching    Pt tolerates medication if given with diphenhydramine    Mucinex [Guaifenesin Er]     PVCs   Nsaids     Gastric Bypass Surgery - unable to take, tolerates ec 81 aspirin     Silicone Hives    Unsure   Tape Hives    Past Medical History Past Medical History:  Diagnosis Date   ABDOMINAL PAIN, CHRONIC 10/20/2009   Acute cystitis 08/17/2009   ALLERGIC RHINITIS 11/21/2007   Allergy     ASYMPTOMATIC POSTMENOPAUSAL STATUS 09/29/2008   Bariatric surgery status 07/07/2010   Bariatric surgery status 07/07/2010   Qualifier: Diagnosis of  By: Kassie MD, Sean A    Cancer [facility])    uterine   Cataract    Coronary artery disease    DEPRESSION 11/21/2007   DIABETES MELLITUS, TYPE II 07/08/2007   DYSPNEA 05/20/2009   Edema 05/20/2009   Eosinophilic esophagitis 02/13/2008   FEVER UNSPECIFIED 10/20/2009   FEVER, HX OF 03/09/2010   GERD (gastroesophageal reflux disease)    Headache(784.0) 02/06/2010   Hepatomegaly 01/06/2008   HYPERLIPIDEMIA 07/08/2007   HYPERTENSION 07/08/2007   Kidney disease    followed by nephrology, per patient   LEUKOPENIA, MILD 09/29/2008   Lymphedema    left leg, dx approximately in 03/2023 per patient   Melanoma in situ of left upper arm (HCC) 04/03/2023   OBESITY 01/06/2008   OSTEOARTHRITIS 01/06/2008    Other chronic nonalcoholic liver disease 01/06/2008   PERIPHERAL NEUROPATHY 11/21/2007   Peripheral neuropathy    Postsurgical hypothyroidism 09/19/2010   SINUSITIS- ACUTE-NOS 11/21/2007   TB SKIN TEST, POSITIVE 02/06/2010   THYROID  NODULE 03/09/2010    Past Surgical History Past Surgical History:  Procedure Laterality Date   ABDOMINAL HYSTERECTOMY     BASAL CELL CARCINOMA EXCISION     CARDIAC CATHETERIZATION     CHOLECYSTECTOMY N/A 05/11/2021   Procedure: LAPAROSCOPIC CHOLECYSTECTOMY WITH POSSIBLE  INTRAOPERATIVE CHOLANGIOGRAM;  Surgeon: Gladis Cough, MD;  Location: [facility];  Service: General;  Laterality: N/A;   COLONOSCOPY     CORONARY ATHERECTOMY N/A 05/03/2022   Procedure: CORONARY ATHERECTOMY;  Surgeon: Wendel Lurena POUR, MD;  Location: [facility];  Service: Cardiovascular;  Laterality: N/A;   CORONARY IMAGING/OCT N/A 05/03/2022   Procedure: INTRAVASCULAR IMAGING/OCT;  Surgeon: Wendel Lurena POUR, MD;  Location: [facility];  Service: Cardiovascular;  Laterality: N/A;   CORONARY PRESSURE/FFR STUDY N/A 05/03/2022   Procedure: INTRAVASCULAR PRESSURE WIRE/FFR STUDY;  Surgeon: Wendel Lurena POUR, MD;  Location: [facility];  Service: Cardiovascular;  Laterality: N/A;   CORONARY STENT INTERVENTION N/A 05/03/2022  Procedure: CORONARY STENT INTERVENTION;  Surgeon: Thukkani, Arun K, MD;  Location: [facility];  Service: Cardiovascular;  Laterality: N/A;  lad   EYE SURGERY Bilateral 08/2018, 09/2018   FOOT SURGERY Left    10/2019   KNEE ARTHROSCOPY Left    LEFT HEART CATH AND CORONARY ANGIOGRAPHY N/A 01/20/2019   Procedure: LEFT HEART CATH AND CORONARY ANGIOGRAPHY;  Surgeon: Claudene Victory ORN, MD;  Location: [facility];  Service: Cardiovascular;  Laterality: N/A;   LYMPH NODE DISSECTION     OOPHORECTOMY     Right total knee replacement     RIGHT/LEFT HEART CATH AND CORONARY ANGIOGRAPHY N/A 05/03/2022   Procedure: RIGHT/LEFT HEART CATH AND CORONARY  ANGIOGRAPHY;  Surgeon: Wendel Lurena POUR, MD;  Location: [facility];  Service: Cardiovascular;  Laterality: N/A;   RIGHT/LEFT HEART CATH AND CORONARY ANGIOGRAPHY N/A 08/06/2024   Procedure: RIGHT/LEFT HEART CATH AND CORONARY ANGIOGRAPHY;  Surgeon: Wendel Lurena POUR, MD;  Location: [facility];  Service: Cardiovascular;  Laterality: N/A;   ROUX-EN-Y GASTRIC BYPASS  2011   SKIN TAG REMOVAL     TEE WITH CARDIOVERSION     THYROIDECTOMY     TONSILLECTOMY AND ADENOIDECTOMY     TOOTH EXTRACTION  2023   TRANSESOPHAGEAL ECHOCARDIOGRAM (CATH LAB) N/A 07/06/2024   Procedure: TRANSESOPHAGEAL ECHOCARDIOGRAM;  Surgeon: Loni Soyla LABOR, MD;  Location: [facility];  Service: Cardiovascular;  Laterality: N/A;   UPPER GASTROINTESTINAL ENDOSCOPY     UPPER GI ENDOSCOPY  2024    Family History family history includes Bladder Cancer in her sister; Breast cancer in her maternal aunt and sister; Colon cancer in her maternal uncle; Congestive Heart Failure in her father; Diabetes in her father, maternal aunt, maternal grandmother, maternal uncle, mother, and paternal grandmother; Hypertension in her father; Liver cancer in her maternal aunt; Multiple sclerosis in her sister; Myelodysplastic syndrome in her father; Neuropathy in her mother and paternal aunt; Osteoporosis in her sister.  Social History Social History   Socioeconomic History   Marital status: Single    Spouse name: Not on file   Number of children: 0   Years of education: 16   Highest education level: Bachelor's degree (e.g., BA, AB, BS)  Occupational History   Occupation: research scientist (medical)   Occupation: Retired  Tobacco Use   Smoking status: Former    Current packs/day: 0.00    Average packs/day: 1.5 packs/day for 15.0 years (22.5 ttl pk-yrs)    Types: Cigarettes    Start date: 36    Quit date: 1992    Years since quitting: 33.8    Passive exposure: Never   Smokeless tobacco: Never  Vaping Use   Vaping status: Never  Used  Substance and Sexual Activity   Alcohol use: Yes    Comment: rarely   Drug use: Never   Sexual activity: Not Currently  Other Topics Concern   Not on file  Social History Narrative   Not on file   Social Drivers of Health   Financial Resource Strain: Low Risk  (05/06/2024)   Overall Financial Resource Strain (CARDIA)    Difficulty of Paying Living Expenses: Not very hard  Food Insecurity: Low Risk  (07/19/2024)   Received from [facility]   Hunger Vital Sign    Within the past 12 months, you worried that your food would run out before you got money to buy more: Never true    Within the past 12 months, the food you bought  just didn't last and you didn't have money to get more. : Never true  Transportation Needs: No Transportation Needs (07/19/2024)   [facility]    In the past 12 months, has lack of reliable transportation kept you from medical appointments, meetings, work or from getting things needed for daily living? : No  Physical Activity: Inactive (05/06/2024)   Exercise Vital Sign    Days of Exercise per Week: 0 days    Minutes of Exercise per Session: Not on file  Stress: No Stress Concern Present (05/06/2024)   [employer] of Occupational Health - Occupational Stress Questionnaire    Feeling of Stress: Only a little  Social Connections: Moderately Integrated (05/06/2024)   Social Connection and Isolation Panel    Frequency of Communication with Friends and Family: More than three times a week    Frequency of Social Gatherings with Friends and Family: Three times a week    Attends Religious Services: More than 4 times per year    Active Member of Clubs or Organizations: Yes    Attends Banker Meetings: More than 4 times per year    Marital Status: Never married  Intimate Partner Violence: Not At Risk (05/19/2020)   Humiliation, Afraid, Rape, and Kick questionnaire    Fear of Current or Ex-Partner: No    Emotionally  Abused: No    Physically Abused: No    Sexually Abused: No    Lab Results  Component Value Date   HGBA1C 8.0 (A) 09/23/2024   HGBA1C 8.9 (A) 10/17/2023   HGBA1C 7.1 (H) 02/22/2023   Lab Results  Component Value Date   CHOL 120 02/20/2024   Lab Results  Component Value Date   HDL 44 (L) 02/20/2024   Lab Results  Component Value Date   LDLCALC 58 02/20/2024   Lab Results  Component Value Date   TRIG 94 02/20/2024   Lab Results  Component Value Date   CHOLHDL 2.7 02/20/2024   Lab Results  Component Value Date   CREATININE 1.62 (H) 07/27/2024   Lab Results  Component Value Date   GFR 42.20 (L) 05/07/2024   Lab Results  Component Value Date   MICROALBUR 0.7 05/07/2024      Component Value Date/Time   NA 142 08/06/2024 1148   NA 141 07/27/2024 1056   K 4.0 08/06/2024 1148   CL 105 07/27/2024 1056   CO2 20 07/27/2024 1056   GLUCOSE 113 (H) 07/27/2024 1056   GLUCOSE 138 (H) 07/06/2024 0756   BUN 32 (H) 07/27/2024 1056   CREATININE 1.62 (H) 07/27/2024 1056   CREATININE 1.51 (H) 02/22/2023 1513   CALCIUM  8.7 07/27/2024 1056   CALCIUM  8.9 01/30/2013 1017   PROT 6.3 05/07/2024 1243   PROT 5.8 (L) 10/09/2021 1214   ALBUMIN 4.0 05/07/2024 1243   ALBUMIN 4.0 10/09/2021 1214   AST 52 (H) 05/07/2024 1243   AST 25 02/29/2020 1303   ALT 75 (H) 05/07/2024 1243   ALT 37 02/29/2020 1303   ALKPHOS 173 (H) 05/07/2024 1243   BILITOT 0.3 05/07/2024 1243   BILITOT 0.4 10/09/2021 1214   BILITOT 0.4 02/29/2020 1303   GFRNONAA >60 05/13/2021 0645   GFRNONAA 63 08/16/2020 1207   GFRAA 73 08/16/2020 1207      Latest Ref Rng & Units 08/06/2024   11:48 AM 08/06/2024   11:45 AM 08/06/2024   11:43 AM  BMP  Sodium 135 - 145 mmol/L 142  141  141  Potassium 3.5 - 5.1 mmol/L 4.0  4.1  4.1        Component Value Date/Time   WBC 3.6 (L) 09/21/2024 1358   WBC 4.7 05/07/2024 1243   RBC 3.66 (L) 09/21/2024 1358   HGB 11.7 (L) 09/21/2024 1358   HGB 12.0 07/27/2024 1056   HCT  35.7 (L) 09/21/2024 1358   HCT 38.0 07/27/2024 1056   PLT 174 09/21/2024 1358   PLT 196 07/27/2024 1056   MCV 97.5 09/21/2024 1358   MCV 97 07/27/2024 1056   MCH 32.0 09/21/2024 1358   MCHC 32.8 09/21/2024 1358   RDW 15.1 09/21/2024 1358   RDW 15.1 07/27/2024 1056   LYMPHSABS 1.1 09/21/2024 1358   LYMPHSABS 1.2 05/01/2022 0943   MONOABS 0.4 09/21/2024 1358   EOSABS 0.3 09/21/2024 1358   EOSABS 0.3 05/01/2022 0943   BASOSABS 0.0 09/21/2024 1358   BASOSABS 0.0 05/01/2022 0943     Parts of this note may have been dictated using voice recognition software. There may be variances in spelling and vocabulary which are unintentional. Not all errors are proofread. Please notify the dino if any discrepancies are noted or if the meaning of any statement is not clear.

## 2024-09-24 LAB — TSH+FREE T4: TSH W/REFLEX TO FT4: 3.41 m[IU]/L (ref 0.40–4.50)

## 2024-10-05 ENCOUNTER — Other Ambulatory Visit: Payer: Self-pay | Admitting: Internal Medicine

## 2024-10-05 DIAGNOSIS — Z79899 Other long term (current) drug therapy: Secondary | ICD-10-CM

## 2024-10-05 DIAGNOSIS — I5032 Chronic diastolic (congestive) heart failure: Secondary | ICD-10-CM

## 2024-10-05 DIAGNOSIS — I340 Nonrheumatic mitral (valve) insufficiency: Secondary | ICD-10-CM

## 2024-10-07 MED ORDER — POTASSIUM CHLORIDE CRYS ER 20 MEQ PO TBCR: 20.0000 meq | EXTENDED_RELEASE_TABLET | Freq: Every day | ORAL | 3 refills | Status: AC

## 2024-10-07 NOTE — Progress Notes (Deleted)
 Office Visit Note  Patient: Savannah [name]             Date of Birth: 03/17/1956           MRN: [id number]             PCP: Kennyth Worth HERO, MD Referring: Kennyth Worth HERO, MD Visit Date: 10/20/2024 Occupation: Data Unavailable  Subjective:  No chief complaint on file.   History of Present Illness: Savannah [name] is a 68 y.o. female ***     Activities of Daily Living:  Patient reports morning stiffness for *** {minute/hour:19697}.   Patient {ACTIONS;DENIES/REPORTS:21021675::Denies} nocturnal pain.  Difficulty dressing/grooming: {ACTIONS;DENIES/REPORTS:21021675::Denies} Difficulty climbing stairs: {ACTIONS;DENIES/REPORTS:21021675::Denies} Difficulty getting out of chair: {ACTIONS;DENIES/REPORTS:21021675::Denies} Difficulty using hands for taps, buttons, cutlery, and/or writing: {ACTIONS;DENIES/REPORTS:21021675::Denies}  No Rheumatology ROS completed.   PMFS History:  Patient Active Problem List   Diagnosis Date Noted   Severe mitral valve regurgitation 07/06/2024   Mitral valve insufficiency 07/06/2024   Bilateral carpal tunnel syndrome 03/13/2022   Leg edema 03/01/2022   Nondisplaced comminuted fracture of left patella, initial encounter for closed fracture 08/01/2021   Arthritis of carpometacarpal [facility]) joint of right thumb 07/28/2021   Primary osteoarthritis of right distal radioulnar joint 07/28/2021   Arthritis of wrist, right, degenerative 07/28/2021   History of malignant neoplasm of uterine body 07/27/2021   Osteopenia 01/14/2021   Sesamoiditis of left foot 11/09/2020   Posterior tibial tendinitis of left lower extremity 11/09/2020   Type 2 diabetes mellitus with diabetic peripheral angiopathy without gangrene, with long-term current use of insulin  (HCC) 10/21/2020   Iron  deficiency anemia 03/14/2020   Lesion of vulva 09/02/2019   Menorrhagia 09/02/2019   Neuropathy 09/02/2019   Irregular bowel habits 06/25/2019   Coronary artery disease involving native  coronary artery of native heart without angina pectoris    Temporomandibular joint disorder 12/05/2018   Fatigue 12/05/2018   Inverted nipple 12/05/2018   Senile purpura 11/21/2018   GERD with esophagitis 11/21/2018   Pseudogout involving multiple joints 11/21/2018   Lipoma 07/22/2017   Lichen sclerosus 11/16/2016   H/O gastric bypass 11/16/2016   Depression 11/16/2016   Diabetic polyneuropathy associated with type 2 diabetes mellitus (HCC) 11/16/2016   Uterine cancer (HCC) s/p hysterectomy 1997 01/04/2012   Postsurgical hypothyroidism 09/19/2010   Diabetic neuropathy (HCC) 11/21/2007   Allergic rhinitis 11/21/2007   Dyslipidemia associated with type 2 diabetes mellitus (HCC) 07/08/2007    Past Medical History:  Diagnosis Date   ABDOMINAL PAIN, CHRONIC 10/20/2009   Acute cystitis 08/17/2009   ALLERGIC RHINITIS 11/21/2007   Allergy     ASYMPTOMATIC POSTMENOPAUSAL STATUS 09/29/2008   Bariatric surgery status 07/07/2010   Bariatric surgery status 07/07/2010   Qualifier: Diagnosis of  By: Kassie MD, Sean A    Cancer [facility])    uterine   Cataract    Coronary artery disease    DEPRESSION 11/21/2007   DIABETES MELLITUS, TYPE II 07/08/2007   DYSPNEA 05/20/2009   Edema 05/20/2009   Eosinophilic esophagitis 02/13/2008   FEVER UNSPECIFIED 10/20/2009   FEVER, HX OF 03/09/2010   GERD (gastroesophageal reflux disease)    Headache(784.0) 02/06/2010   Hepatomegaly 01/06/2008   HYPERLIPIDEMIA 07/08/2007   HYPERTENSION 07/08/2007   Kidney disease    followed by nephrology, per patient   LEUKOPENIA, MILD 09/29/2008   Lymphedema    left leg, dx approximately in 03/2023 per patient   Melanoma in situ of left upper arm (HCC) 04/03/2023   OBESITY 01/06/2008   OSTEOARTHRITIS 01/06/2008  Other chronic nonalcoholic liver disease 01/06/2008   PERIPHERAL NEUROPATHY 11/21/2007   Peripheral neuropathy    Postsurgical hypothyroidism 09/19/2010   SINUSITIS- ACUTE-NOS 11/21/2007   TB SKIN TEST,  POSITIVE 02/06/2010   THYROID  NODULE 03/09/2010    Family History  Problem Relation Age of Onset   Diabetes Mother    Neuropathy Mother    Diabetes Father    Congestive Heart Failure Father    Hypertension Father    Myelodysplastic syndrome Father    Bladder Cancer Sister    Multiple sclerosis Sister    Breast cancer Sister        breast onset age 15   Osteoporosis Sister    Liver cancer Maternal Aunt    Breast cancer Maternal Aunt    Diabetes Maternal Aunt    Colon cancer Maternal Uncle    Diabetes Maternal Uncle    Neuropathy Paternal Aunt    Diabetes Maternal Grandmother    Diabetes Paternal Grandmother    Past Surgical History:  Procedure Laterality Date   ABDOMINAL HYSTERECTOMY     BASAL CELL CARCINOMA EXCISION     CARDIAC CATHETERIZATION     CHOLECYSTECTOMY N/A 05/11/2021   Procedure: LAPAROSCOPIC CHOLECYSTECTOMY WITH POSSIBLE  INTRAOPERATIVE CHOLANGIOGRAM;  Surgeon: Gladis Cough, MD;  Location: [facility];  Service: General;  Laterality: N/A;   COLONOSCOPY     CORONARY ATHERECTOMY N/A 05/03/2022   Procedure: CORONARY ATHERECTOMY;  Surgeon: Wendel Lurena POUR, MD;  Location: [facility];  Service: Cardiovascular;  Laterality: N/A;   CORONARY IMAGING/OCT N/A 05/03/2022   Procedure: INTRAVASCULAR IMAGING/OCT;  Surgeon: Wendel Lurena POUR, MD;  Location: [facility];  Service: Cardiovascular;  Laterality: N/A;   CORONARY PRESSURE/FFR STUDY N/A 05/03/2022   Procedure: INTRAVASCULAR PRESSURE WIRE/FFR STUDY;  Surgeon: Wendel Lurena POUR, MD;  Location: [facility];  Service: Cardiovascular;  Laterality: N/A;   CORONARY STENT INTERVENTION N/A 05/03/2022   Procedure: CORONARY STENT INTERVENTION;  Surgeon: Wendel Lurena POUR, MD;  Location: [facility];  Service: Cardiovascular;  Laterality: N/A;  lad   EYE SURGERY Bilateral 08/2018, 09/2018   FOOT SURGERY Left    10/2019   KNEE ARTHROSCOPY Left    LEFT HEART CATH AND CORONARY ANGIOGRAPHY N/A 01/20/2019    Procedure: LEFT HEART CATH AND CORONARY ANGIOGRAPHY;  Surgeon: Claudene Victory ORN, MD;  Location: [facility];  Service: Cardiovascular;  Laterality: N/A;   LYMPH NODE DISSECTION     OOPHORECTOMY     Right total knee replacement     RIGHT/LEFT HEART CATH AND CORONARY ANGIOGRAPHY N/A 05/03/2022   Procedure: RIGHT/LEFT HEART CATH AND CORONARY ANGIOGRAPHY;  Surgeon: Wendel Lurena POUR, MD;  [facility];  Service: Cardiovascular;  Laterality: N/A;   RIGHT/LEFT HEART CATH AND CORONARY ANGIOGRAPHY N/A 08/06/2024   Procedure: RIGHT/LEFT HEART CATH AND CORONARY ANGIOGRAPHY;  Surgeon: Wendel Lurena POUR, MD;  [facility];  Service: Cardiovascular;  Laterality: N/A;   ROUX-EN-Y GASTRIC BYPASS  2011   SKIN TAG REMOVAL     TEE WITH CARDIOVERSION     THYROIDECTOMY     TONSILLECTOMY AND ADENOIDECTOMY     TOOTH EXTRACTION  2023   TRANSESOPHAGEAL ECHOCARDIOGRAM (CATH LAB) N/A 07/06/2024   Procedure: TRANSESOPHAGEAL ECHOCARDIOGRAM;  Surgeon: Loni Soyla LABOR, MD;  Location: [facility];  Service: Cardiovascular;  Laterality: N/A;   UPPER GASTROINTESTINAL ENDOSCOPY     UPPER GI ENDOSCOPY  2024   Social History   Tobacco Use  Smoking status: Former    Current packs/day: 0.00    Average packs/day: 1.5 packs/day for 15.0 years (22.5 ttl pk-yrs)    Types: Cigarettes    Start date: 75    Quit date: 33    Years since quitting: 33.9    Passive exposure: Never   Smokeless tobacco: Never  Vaping Use   Vaping status: Never Used  Substance Use Topics   Alcohol use: Yes    Comment: rarely   Drug use: Never   Social History   Social History Narrative   Not on file     Immunization History  Administered Date(s) Administered   Fluad Quad(high Dose 65+) 07/13/2019, 07/17/2022   INFLUENZA, HIGH DOSE SEASONAL PF 08/15/2021, 08/18/2024   Influenza Inj Mdck Quad Pf 10/19/2017, 07/23/2018   Influenza Whole 07/30/2008, 08/17/2009, 09/19/2010    Influenza,inj,Quad PF,6-35 Mos 07/14/2015   Influenza-Unspecified 10/13/2011, 08/12/2013, 07/13/2017, 07/23/2018, 08/16/2023   Moderna Covid-19 Fall Seasonal Vaccine 19yrs & older 10/06/2022   PFIZER(Purple Top)SARS-COV-2 Vaccination 12/21/2019, 01/14/2020, 08/18/2020   Pfizer Covid-19 Vaccine Bivalent Booster 31yrs & up 08/15/2021   Pfizer(Comirnaty)Fall Seasonal Vaccine 12 years and older 08/18/2024   Pneumococcal Conjugate-13 10/14/2015   Pneumococcal Polysaccharide-23 06/25/2014, 10/21/2020   Respiratory Syncytial Virus Vaccine,Recomb Aduvanted(Arexvy) 07/17/2022   Tdap 05/20/2020   Zoster Recombinant(Shingrix) 05/20/2020, 10/06/2022   Zoster, Live 06/25/2014     Objective: Vital Signs: There were no vitals taken for this visit.   Physical Exam   Musculoskeletal Exam: ***  CDAI Exam: CDAI Score: -- Patient Global: --; Provider Global: -- Swollen: --; Tender: -- Joint Exam 10/20/2024   No joint exam has been documented for this visit   There is currently no information documented on the homunculus. Go to the Rheumatology activity and complete the homunculus joint exam.  Investigation: No additional findings.  Imaging: No results found.  Recent Labs: Lab Results  Component Value Date   WBC 3.6 (L) 09/21/2024   HGB 11.7 (L) 09/21/2024   PLT 174 09/21/2024   NA 142 08/06/2024   K 4.0 08/06/2024   CL 105 07/27/2024   CO2 20 07/27/2024   GLUCOSE 113 (H) 07/27/2024   BUN 32 (H) 07/27/2024   CREATININE 1.62 (H) 07/27/2024   BILITOT 0.3 05/07/2024   ALKPHOS 173 (H) 05/07/2024   AST 52 (H) 05/07/2024   ALT 75 (H) 05/07/2024   PROT 6.3 05/07/2024   ALBUMIN 4.0 05/07/2024   CALCIUM  8.7 07/27/2024   GFRAA 73 08/16/2020    Speciality Comments: No specialty comments available.  Procedures:  No procedures performed Allergies: Ace inhibitors, Caffeine, Ciprofloxacin, Morphine  and codeine, Mucinex [guaifenesin er], Nsaids, Silicone, and Tape   Assessment / Plan:      Visit Diagnoses: No diagnosis found.  Orders: No orders of the defined types were placed in this encounter.  No orders of the defined types were placed in this encounter.   Face-to-face time spent with patient was *** minutes. Greater than 50% of time was spent in counseling and coordination of care.  Follow-Up Instructions: No follow-ups on file.   Daved JAYSON Gavel, CMA  Note - This record has been created using Animal nutritionist.  Chart creation errors have been sought, but may not always  have been located. Such creation errors do not reflect on  the standard of medical care.

## 2024-10-11 ENCOUNTER — Encounter: Payer: Self-pay | Admitting: "Endocrinology

## 2024-10-11 DIAGNOSIS — E1165 Type 2 diabetes mellitus with hyperglycemia: Secondary | ICD-10-CM

## 2024-10-12 MED ORDER — INSULIN PEN NEEDLE 31G X 8 MM MISC: 1.0000 | Freq: Four times a day (QID) | 3 refills | Status: DC

## 2024-10-13 ENCOUNTER — Other Ambulatory Visit: Payer: Self-pay | Admitting: Internal Medicine

## 2024-10-13 DIAGNOSIS — Z79899 Other long term (current) drug therapy: Secondary | ICD-10-CM

## 2024-10-13 DIAGNOSIS — I5032 Chronic diastolic (congestive) heart failure: Secondary | ICD-10-CM

## 2024-10-13 DIAGNOSIS — I340 Nonrheumatic mitral (valve) insufficiency: Secondary | ICD-10-CM

## 2024-10-14 ENCOUNTER — Encounter: Payer: Self-pay | Admitting: Family Medicine

## 2024-10-14 ENCOUNTER — Ambulatory Visit: Admitting: Family Medicine

## 2024-10-14 VITALS — BP 120/82 | HR 74 | Temp 97.0°F | Ht 71.0 in | Wt 211.4 lb

## 2024-10-14 DIAGNOSIS — N390 Urinary tract infection, site not specified: Secondary | ICD-10-CM | POA: Diagnosis not present

## 2024-10-14 DIAGNOSIS — F322 Major depressive disorder, single episode, severe without psychotic features: Secondary | ICD-10-CM | POA: Insufficient documentation

## 2024-10-14 DIAGNOSIS — M791 Myalgia, unspecified site: Secondary | ICD-10-CM | POA: Diagnosis not present

## 2024-10-14 DIAGNOSIS — F339 Major depressive disorder, recurrent, unspecified: Secondary | ICD-10-CM | POA: Diagnosis not present

## 2024-10-14 DIAGNOSIS — M47812 Spondylosis without myelopathy or radiculopathy, cervical region: Secondary | ICD-10-CM | POA: Diagnosis not present

## 2024-10-14 DIAGNOSIS — R300 Dysuria: Secondary | ICD-10-CM

## 2024-10-14 DIAGNOSIS — G894 Chronic pain syndrome: Secondary | ICD-10-CM | POA: Diagnosis not present

## 2024-10-14 DIAGNOSIS — R0981 Nasal congestion: Secondary | ICD-10-CM | POA: Diagnosis not present

## 2024-10-14 DIAGNOSIS — M47816 Spondylosis without myelopathy or radiculopathy, lumbar region: Secondary | ICD-10-CM | POA: Diagnosis not present

## 2024-10-14 LAB — POCT URINALYSIS DIPSTICK
Bilirubin, UA: NEGATIVE
Blood, UA: NEGATIVE
Glucose, UA: POSITIVE — AB
Ketones, UA: NEGATIVE
Nitrite, UA: NEGATIVE
Protein, UA: NEGATIVE
Spec Grav, UA: 1.01 (ref 1.010–1.025)
Urobilinogen, UA: 0.2 U/dL
pH, UA: 5.5 (ref 5.0–8.0)

## 2024-10-14 LAB — POCT INFLUENZA A/B
Influenza A, POC: NEGATIVE
Influenza B, POC: NEGATIVE

## 2024-10-14 LAB — POC COVID19 BINAXNOW: SARS Coronavirus 2 Ag: NEGATIVE

## 2024-10-14 MED ORDER — CEPHALEXIN 500 MG PO CAPS: 500.0000 mg | ORAL_CAPSULE | Freq: Two times a day (BID) | ORAL | 0 refills | Status: AC

## 2024-10-14 NOTE — Assessment & Plan Note (Signed)
 Straight and UA are consistent with recurrent UTI.  She has about 4-5 UTIs per year.  Urine culture results are pending however we will empirically start Keflex  500 mg twice daily while we await results.  Encouraged hydration.  Due to recurrent nature of UTIs we will refer to urology for further evaluation.  We discussed reasons return to care.

## 2024-10-14 NOTE — Progress Notes (Signed)
 Savannah [name] is a 68 y.o. female who presents today for an office visit.  Assessment/Plan:  New/Acute Problems: Nasal congestion No red flags.  Likely viral URI.  Rapid COVID and flu negative.  She can continue with conservative measures.  We discussed reasons to return to care.  Chronic Problems Addressed Today: Recurrent UTI Straight and UA are consistent with recurrent UTI.  She has about 4-5 UTIs per year.  Urine culture results are pending however we will empirically start Keflex  500 mg twice daily while we await results.  Encouraged hydration.  Due to recurrent nature of UTIs we will refer to urology for further evaluation.  We discussed reasons return to care.  Depression, recurrent PHQ score today is elevated but stable.  Multifactorial in setting of her recent medical issues as well as current UTI.  Did not fully address this today due to discussion of her above issues and time constraints however we have placed referral for her to see therapist a few months ago. She has declined medications in the past.   Elevated blood pressure Initially elevated 142/78 though at goal recheck.  She can monitor at home and let us  know if persistently elevated.    Subjective:  HPI:  See assessment / plan for status of chronic conditions.   Discussed the use of AI scribe software for clinical note transcription with the patient, who gave verbal consent to proceed.  History of Present Illness Savannah [name] is a 68 year old female who presents with nasal congestion and travel-related health concerns.  She experiences recurrent urinary tract infections (UTIs) approximately four times a year. Symptoms often resolve with increased fluid intake, but she has visited the emergency room twice last year in [place]  for UTIs. She has been treated with antibiotics such as nitrofurantoin  and cephalexin . Her urine flow varies, sometimes being a trickle and other times a steady flow. She mentions that travel  often exacerbates her UTIs.  She has nasal congestion that began yesterday, accompanied by an itchy nose and frequent sneezing. She has been around large groups of people, such as at church, which could be a source of exposure to illness.  She has a history of thyroid  issues, which have been stabilized, and anemia, for which she received an iron  infusion. She reports persistent fatigue, sleeping excessively, and difficulty staying awake during the day. She describes severe muscle cramps, particularly when dehydrated, which recently affected her entire body and lasted over an hour and a half. She associates these cramps with dehydration and is concerned about her potassium levels.  She is planning to travel next week and is concerned about managing her health during this time. She attends church regularly and is planning to visit family.      10/14/2024    9:38 AM 05/07/2024   12:42 PM 01/27/2024    2:23 PM 09/26/2023    1:46 PM 02/22/2023    2:10 PM  Depression screen PHQ 2/9  Decreased Interest 2 2 0 0 0  Down, Depressed, Hopeless 2 2 1  0 0  PHQ - 2 Score 4 4 1  0 0  Altered sleeping 3 3     Tired, decreased energy 3 3     Change in appetite 3 3     Feeling bad or failure about yourself  3 3     Trouble concentrating 2 1     Moving slowly or fidgety/restless 1 1     Suicidal thoughts  0  PHQ-9 Score 19 18      Difficult doing work/chores  Very difficult        Data saved with a previous flowsheet row definition          Objective:  Physical Exam: BP 120/82   Pulse 74   Temp (!) 97 F (36.1 C) (Temporal)   Ht 5' 11 (1.803 m)   Wt 211 lb 6.4 oz (95.9 kg)   SpO2 97%   BMI 29.48 kg/m   Gen: No acute distress, resting comfortably CV: Regular rate and rhythm with no murmurs appreciated Pulm: Normal work of breathing, clear to auscultation bilaterally with no crackles, wheezes, or rhonchi Neuro: Grossly normal, moves all extremities Psych: Normal affect and thought  content      Zainab Crumrine M. Kennyth, MD 10/14/2024 9:50 AM

## 2024-10-14 NOTE — Assessment & Plan Note (Addendum)
 PHQ score today is elevated but stable.  Multifactorial in setting of her recent medical issues as well as current UTI.  Did not fully address this today due to discussion of her above issues and time constraints however we have placed referral for her to see therapist a few months ago. She has declined medications in the past.

## 2024-10-14 NOTE — Patient Instructions (Signed)
 It was very nice to see you today!  VISIT SUMMARY: Today, we addressed your nasal congestion and recurrent urinary tract infections (UTIs). We also discussed your general health maintenance and upcoming travel plans.  YOUR PLAN: RECURRENT URINARY TRACT INFECTION: You experience recurrent UTIs, about four to five times a year, which may be due to incomplete bladder emptying and dehydration. -You have been prescribed Keflex  for your current UTI. -A urinalysis suggests a UTI, and we are waiting for culture results. -You have been referred to a urologist for further evaluation, including potential urodynamic studies. -Please maintain good hydration to help prevent future UTIs.  NASAL CONGESTION: You have nasal congestion, an itchy nose, and frequent sneezing, which could be due to a viral infection or allergies. -We performed COVID-19 and flu swab tests. -If your symptoms persist, you can use a nasal spray.  GENERAL HEALTH MAINTENANCE: Your general health maintenance is up to date, including your flu vaccination. -Your blood pressure was slightly elevated today, but it is typically low in the mornings. We rechecked it before you left the clinic.  Return if symptoms worsen or fail to improve.   Take care, Dr Kennyth  PLEASE NOTE:  If you had any lab tests, please let us  know if you have not heard back within a few days. You may see your results on mychart before we have a chance to review them but we will give you a call once they are reviewed by us .   If we ordered any referrals today, please let us  know if you have not heard from their office within the next week.   If you had any urgent prescriptions sent in today, please check with the pharmacy within an hour of our visit to make sure the prescription was transmitted appropriately.   Please try these tips to maintain a healthy lifestyle:  Eat at least 3 REAL meals and 1-2 snacks per day.  Aim for no more than 5 hours between eating.   If you eat breakfast, please do so within one hour of getting up.   Each meal should contain half fruits/vegetables, one quarter protein, and one quarter carbs (no bigger than a computer mouse)  Cut down on sweet beverages. This includes juice, soda, and sweet tea.   Drink at least 1 glass of water  with each meal and aim for at least 8 glasses per day  Exercise at least 150 minutes every week.

## 2024-10-15 ENCOUNTER — Other Ambulatory Visit: Payer: Self-pay | Admitting: Internal Medicine

## 2024-10-15 ENCOUNTER — Telehealth: Payer: Self-pay | Admitting: "Endocrinology

## 2024-10-15 DIAGNOSIS — I340 Nonrheumatic mitral (valve) insufficiency: Secondary | ICD-10-CM

## 2024-10-15 DIAGNOSIS — I5032 Chronic diastolic (congestive) heart failure: Secondary | ICD-10-CM

## 2024-10-15 DIAGNOSIS — E1165 Type 2 diabetes mellitus with hyperglycemia: Secondary | ICD-10-CM

## 2024-10-15 DIAGNOSIS — Z79899 Other long term (current) drug therapy: Secondary | ICD-10-CM

## 2024-10-15 MED ORDER — LANTUS SOLOSTAR 100 UNIT/ML ~~LOC~~ SOPN: 25.0000 [IU] | PEN_INJECTOR | Freq: Every day | SUBCUTANEOUS | 1 refills | Status: AC

## 2024-10-15 MED ORDER — INSULIN PEN NEEDLE 31G X 8 MM MISC: 1.0000 | Freq: Four times a day (QID) | 3 refills | Status: AC

## 2024-10-15 NOTE — Telephone Encounter (Signed)
 MEDICATION: Pen needles more 4-5 per x30  Lantus   Longer needles   PHARMACY:  [facility] [address], [place] - [address] [place] [address] Phone: [phone]  Fax: [phone]   HAS THE PATIENT CONTACTED THEIR PHARMACY?  Yes  LAST REFILL:  @@LASTREFILL @  IS THIS A 90 DAY SUPPLY :   IS PATIENT OUT OF MEDICATION:   IF NOT; HOW MUCH IS LEFT:   LAST APPOINTMENT DATE: @11 /10/2024  NEXT APPOINTMENT DATE:@1 /21/2026  DO WE HAVE YOUR PERMISSION TO LEAVE A DETAILED MESSAGE?: Yes  OTHER COMMENTS:    **Let patient know to contact pharmacy at the end of the day to make sure medication is ready. **  ** Please notify patient to allow 48-72 hours to process**  **Encourage patient to contact the pharmacy for refills or they can request refills through [facility]**

## 2024-10-17 LAB — URINE CULTURE
MICRO NUMBER:: 17307444
SPECIMEN QUALITY:: ADEQUATE

## 2024-10-19 ENCOUNTER — Ambulatory Visit: Payer: Self-pay | Admitting: Family Medicine

## 2024-10-19 ENCOUNTER — Other Ambulatory Visit: Payer: Self-pay | Admitting: Internal Medicine

## 2024-10-19 MED ORDER — FUROSEMIDE 20 MG PO TABS: 40.0000 mg | ORAL_TABLET | Freq: Every day | ORAL | 3 refills | Status: AC

## 2024-10-19 NOTE — Progress Notes (Signed)
 Her urine culture shows UTI.  This should be treated with the antibiotic we sent in.  She should let us  know if symptoms are not improving.

## 2024-10-20 ENCOUNTER — Ambulatory Visit: Admitting: Rheumatology

## 2024-10-20 DIAGNOSIS — Z96651 Presence of right artificial knee joint: Secondary | ICD-10-CM

## 2024-10-20 DIAGNOSIS — M810 Age-related osteoporosis without current pathological fracture: Secondary | ICD-10-CM

## 2024-10-20 DIAGNOSIS — Z5181 Encounter for therapeutic drug level monitoring: Secondary | ICD-10-CM

## 2024-10-20 DIAGNOSIS — M7061 Trochanteric bursitis, right hip: Secondary | ICD-10-CM

## 2024-10-20 DIAGNOSIS — Z8542 Personal history of malignant neoplasm of other parts of uterus: Secondary | ICD-10-CM

## 2024-10-20 DIAGNOSIS — N1831 Chronic kidney disease, stage 3a: Secondary | ICD-10-CM

## 2024-10-20 DIAGNOSIS — E559 Vitamin D deficiency, unspecified: Secondary | ICD-10-CM

## 2024-10-20 DIAGNOSIS — E890 Postprocedural hypothyroidism: Secondary | ICD-10-CM

## 2024-10-20 DIAGNOSIS — M19072 Primary osteoarthritis, left ankle and foot: Secondary | ICD-10-CM

## 2024-10-20 DIAGNOSIS — Z8669 Personal history of other diseases of the nervous system and sense organs: Secondary | ICD-10-CM

## 2024-10-20 DIAGNOSIS — K200 Eosinophilic esophagitis: Secondary | ICD-10-CM

## 2024-10-20 DIAGNOSIS — Z8639 Personal history of other endocrine, nutritional and metabolic disease: Secondary | ICD-10-CM

## 2024-10-20 DIAGNOSIS — M19042 Primary osteoarthritis, left hand: Secondary | ICD-10-CM

## 2024-10-20 DIAGNOSIS — Z9049 Acquired absence of other specified parts of digestive tract: Secondary | ICD-10-CM

## 2024-10-20 DIAGNOSIS — M51369 Other intervertebral disc degeneration, lumbar region without mention of lumbar back pain or lower extremity pain: Secondary | ICD-10-CM

## 2024-10-20 DIAGNOSIS — M1380 Other specified arthritis, unspecified site: Secondary | ICD-10-CM

## 2024-10-20 DIAGNOSIS — M5030 Other cervical disc degeneration, unspecified cervical region: Secondary | ICD-10-CM

## 2024-10-20 DIAGNOSIS — Z8719 Personal history of other diseases of the digestive system: Secondary | ICD-10-CM

## 2024-10-20 DIAGNOSIS — R7611 Nonspecific reaction to tuberculin skin test without active tuberculosis: Secondary | ICD-10-CM

## 2024-10-20 DIAGNOSIS — Z9884 Bariatric surgery status: Secondary | ICD-10-CM

## 2024-10-20 DIAGNOSIS — K7581 Nonalcoholic steatohepatitis (NASH): Secondary | ICD-10-CM

## 2024-10-20 DIAGNOSIS — M1712 Unilateral primary osteoarthritis, left knee: Secondary | ICD-10-CM

## 2024-10-21 ENCOUNTER — Other Ambulatory Visit (HOSPITAL_COMMUNITY): Payer: Self-pay

## 2024-10-26 ENCOUNTER — Ambulatory Visit: Admitting: "Endocrinology

## 2024-11-03 ENCOUNTER — Ambulatory Visit: Admitting: Internal Medicine

## 2024-11-13 ENCOUNTER — Encounter: Payer: Self-pay | Admitting: Internal Medicine

## 2024-11-19 ENCOUNTER — Other Ambulatory Visit: Payer: Self-pay

## 2024-11-19 ENCOUNTER — Telehealth: Payer: Self-pay | Admitting: Physician Assistant

## 2024-11-19 MED ORDER — PANCRELIPASE (LIP-PROT-AMYL) 36000-114000 UNITS PO CPEP: ORAL_CAPSULE | ORAL | 3 refills | Status: AC

## 2024-11-19 NOTE — Telephone Encounter (Signed)
 Call from [facility] regarding lipase/protease/amylase (CREON ) 36000 UNITS CPEP capsule. Pharmacy asking for refill order. Please advise thank you.

## 2024-11-24 ENCOUNTER — Telehealth: Payer: Self-pay | Admitting: *Deleted

## 2024-11-24 DIAGNOSIS — I340 Nonrheumatic mitral (valve) insufficiency: Secondary | ICD-10-CM

## 2024-11-24 NOTE — Telephone Encounter (Signed)
 Copied from CRM [id number]. Topic: Referral - Request for Referral >> Nov 20, 2024 11:34 AM Viola FALCON wrote: Did the patient discuss referral with their provider in the last year? Yes (If No - schedule appointment) (If Yes - send message)  Appointment offered? No  Type of order/referral and detailed reason for visit: sleep study   Preference of office, provider, location: Dr. Severa Staff   : [address], [place], [place] [address] If referral order, have you been seen by this specialty before? No (If Yes, this issue or another issue? When? Where?  Can we respond through MyChart? Yes   Ok to placed sleep study referral ? Hena Ewalt,RMA

## 2024-11-24 NOTE — Telephone Encounter (Signed)
-----   Message from Soyla Merck, MD sent at 11/19/2024  3:46 PM EST ----- I have reviewed these records from [facility]. Please arrange 2 week zio for patient for palpitations.  If she would like she can push out her follow up with me if doing well since she was just seen at [facility]. She'll need an echo in summer 2026 and I can see her after that if needed.

## 2024-11-24 NOTE — Telephone Encounter (Signed)
 Called and spoke to pt. She states that at [facility] a heart monitor was ordered for her. [employer]support/administrative [employer]. She only wore it for 3-4 days due to extreme skin irritation. She had to remove it last night. She is in the process of notifying [facility] about this.   She states that at [facility], they gave her the same advice Dr. Wendel gave her: it does not appear to abe a severe valve issue.   Her symptoms have improved greatly since the Lasix  dose was increased. She is fine to f/u with Loni in Summer 2026 after Echo.   Will get in touch with her on 11/26/24 re: Echo and rescheduling appt.

## 2024-11-25 ENCOUNTER — Other Ambulatory Visit: Payer: Self-pay | Admitting: *Deleted

## 2024-11-25 NOTE — Telephone Encounter (Signed)
 Ok with me. Please place any necessary orders.

## 2024-11-25 NOTE — Telephone Encounter (Signed)
 Referral sleep study order

## 2024-11-26 NOTE — Telephone Encounter (Signed)
 Called and spoke to pt. Informed her that someone will reach out to her to schedule the Echo for July 2026 and then we will mail a reminder letter so that she can call and make f/u appt with Loni AFTER Echo. She verbalized understanding.   Also, she has not gotten in touch with [facility] re: heart monitor. She is wondering if she could try a sensitive option from [facility]. Only sensitive option  is by [employer]. These get mailed out to pt. Will ask Dr. Acharya if this is an option, or should we wait for the 3 day results from her monitor at [facility].   Also, she is asking about a referral for a FULL Sleep study. She is trying to get this coordinated thru her PCP and will f/u with us  if they were not able to schedule her.

## 2024-12-01 ENCOUNTER — Encounter: Payer: Self-pay | Admitting: Internal Medicine

## 2024-12-02 ENCOUNTER — Ambulatory Visit: Admitting: "Endocrinology

## 2024-12-03 MED ORDER — CLOPIDOGREL BISULFATE 75 MG PO TABS: 75.0000 mg | ORAL_TABLET | Freq: Every day | ORAL | 2 refills | Status: AC

## 2024-12-04 NOTE — Telephone Encounter (Signed)
 LVM. Will attempt to call pt week of 12/07/24.  12/04/24

## 2024-12-14 NOTE — Telephone Encounter (Signed)
 Called and spoke to pt. She has not yet shipped back the heart monitor that she wore for three days [facility]). She has been unable to get to the shipping center.   She does not think that she will be able to wear a 2 week heart monitor and the adhesive will not be tolerated.   For now she would like to schedule a follow up appt with Dr. Acharya. Scheduled her for 02/02/25. No other concerns.

## 2024-12-16 ENCOUNTER — Encounter (HOSPITAL_BASED_OUTPATIENT_CLINIC_OR_DEPARTMENT_OTHER): Payer: Self-pay

## 2024-12-16 ENCOUNTER — Ambulatory Visit (INDEPENDENT_AMBULATORY_CARE_PROVIDER_SITE_OTHER)

## 2024-12-16 VITALS — BP 113/74 | HR 79 | Wt 208.0 lb

## 2024-12-16 DIAGNOSIS — G4719 Other hypersomnia: Secondary | ICD-10-CM

## 2024-12-16 DIAGNOSIS — G4710 Hypersomnia, unspecified: Secondary | ICD-10-CM

## 2024-12-16 DIAGNOSIS — R5383 Other fatigue: Secondary | ICD-10-CM

## 2024-12-16 DIAGNOSIS — Z87891 Personal history of nicotine dependence: Secondary | ICD-10-CM

## 2024-12-16 NOTE — Patient Instructions (Signed)
 Complete home sleep test; ordered today.  Plan for follow up in 6 weeks.

## 2024-12-16 NOTE — Progress Notes (Signed)
 "  @Patient  ID: Savannah [name], female    DOB: October 15, 1956, 69 y.o.   MRN: [id number]  Chief Complaint  Patient presents with   Establish Care    New Sleep     Referring provider: Kennyth Worth HERO, MD  HPI: Discussed the use of AI scribe software for clinical note transcription with the patient, who gave verbal consent to proceed.  History of Present Illness Savannah [name] is a 69 year old female who presents with excessive daytime sleepiness and fatigue. She was referred by Dr. Cyrena whom she sees for severe MR for evaluation of sleep apnea.  She has experienced excessive daytime sleepiness and fatigue for many years, feeling tired regardless of her sleep schedule. She has a history of working night shifts and maintains a nocturnal sleep pattern, often going to bed between 2 AM and 6 AM. Despite attempts to regulate her sleep schedule, she remains fatigued throughout the day. She has fallen asleep while driving, leading her to avoid driving at night and limit long drives. She uses strategies such as playing loud music or listening to audiobooks to stay awake while driving.  Her family reports a history of snoring, although she is unsure of the consistency of this symptom. She reports not waking up short of breath and mentions having a bad mitral valve for which she takes Lasix . She has experienced palpitations since childhood, specifically premature ventricular contractions (PVCs).  Her sleep environment includes a warm bed setup with multiple layers of blankets, as she feels cold all the time. She reports sleeping better when she is warm. She has used a Fitbit in the past, which indicated poor sleep quality with frequent wakeful periods and less than two hours of sleep per night during a six to eight-hour period.  She has a history of diabetes and previously had thyroid  dysfunction, which has been controlled but did not alleviate her fatigue. She has gained approximately 50 pounds over the last few  years, which she attributes to a lack of control over her weight gain after initially wanting to gain some weight.   TEST/EVENTS : n/a  Allergies[1]  Immunization History  Administered Date(s) Administered   Fluad Quad(high Dose 65+) 07/13/2019, 07/17/2022   INFLUENZA, HIGH DOSE SEASONAL PF 08/15/2021, 08/18/2024   Influenza Inj Mdck Quad Pf 10/19/2017, 07/23/2018   Influenza Whole 07/30/2008, 08/17/2009, 09/19/2010   Influenza,inj,Quad PF,6-35 Mos 07/14/2015   Influenza-Unspecified 10/13/2011, 08/12/2013, 07/13/2017, 07/23/2018, 08/16/2023   Moderna Covid-19 Fall Seasonal Vaccine 81yrs & older 10/06/2022   PFIZER(Purple Top)SARS-COV-2 Vaccination 12/21/2019, 01/14/2020, 08/18/2020   Pfizer Covid-19 Vaccine Bivalent Booster 49yrs & up 08/15/2021   Pfizer(Comirnaty)Fall Seasonal Vaccine 12 years and older 08/18/2024   Pneumococcal Conjugate-13 10/14/2015   Pneumococcal Polysaccharide-23 06/25/2014, 10/21/2020   Respiratory Syncytial Virus Vaccine,Recomb Aduvanted(Arexvy) 07/17/2022   Tdap 05/20/2020   Zoster Recombinant(Shingrix) 05/20/2020, 10/06/2022   Zoster, Live 06/25/2014    Past Medical History:  Diagnosis Date   ABDOMINAL PAIN, CHRONIC 10/20/2009   Acute cystitis 08/17/2009   ALLERGIC RHINITIS 11/21/2007   Allergy     ASYMPTOMATIC POSTMENOPAUSAL STATUS 09/29/2008   Bariatric surgery status 07/07/2010   Bariatric surgery status 07/07/2010   Qualifier: Diagnosis of  By: Kassie MD, Sean A    Cancer [facility])    uterine   Cataract    Coronary artery disease    DEPRESSION 11/21/2007   DIABETES MELLITUS, TYPE II 07/08/2007   DYSPNEA 05/20/2009   Edema 05/20/2009   Eosinophilic esophagitis 02/13/2008   FEVER UNSPECIFIED 10/20/2009  FEVER, HX OF 03/09/2010   GERD (gastroesophageal reflux disease)    Headache(784.0) 02/06/2010   Hepatomegaly 01/06/2008   HYPERLIPIDEMIA 07/08/2007   HYPERTENSION 07/08/2007   Kidney disease    followed by nephrology, per patient    LEUKOPENIA, MILD 09/29/2008   Lymphedema    left leg, dx approximately in 03/2023 per patient   Melanoma in situ of left upper arm (HCC) 04/03/2023   OBESITY 01/06/2008   OSTEOARTHRITIS 01/06/2008   Other chronic nonalcoholic liver disease 01/06/2008   PERIPHERAL NEUROPATHY 11/21/2007   Peripheral neuropathy    Postsurgical hypothyroidism 09/19/2010   SINUSITIS- ACUTE-NOS 11/21/2007   TB SKIN TEST, POSITIVE 02/06/2010   THYROID  NODULE 03/09/2010    Tobacco History: Tobacco Use History[2] Counseling given: Not Answered   Outpatient Medications Prior to Visit  Medication Sig Dispense Refill   acetaminophen  (TYLENOL ) 500 MG tablet Take 2 tablets (1,000 mg total) by mouth every 6 (six) hours as needed for mild pain or fever. 30 tablet 0   atorvastatin  (LIPITOR) 40 MG tablet Take 1 tablet (40 mg total) by mouth daily. 90 tablet 3   blood glucose meter kit and supplies KIT Use up to three  times daily as directed. 1 each 0   Blood Glucose Monitoring Suppl DEVI 1 each by Does not apply route in the morning, at noon, and at bedtime. May substitute to any manufacturer covered by patient's insurance. 1 each 0   Cholecalciferol (VITAMIN D ) 50 MCG (2000 UT) tablet Take 2,000 Units by mouth every evening.     clopidogrel  (PLAVIX ) 75 MG tablet Take 1 tablet (75 mg total) by mouth daily. 90 tablet 2   Colchicine  (MITIGARE ) 0.6 MG CAPS Take 0.6 mg by mouth daily as needed (gout pain).     Continuous Glucose Sensor (DEXCOM G7 SENSOR) MISC 1 Device by Does not apply route See admin instructions. 1 each 0   diclofenac  sodium (VOLTAREN ) 1 % GEL Apply 4 g topically 4 (four) times daily. (Patient taking differently: Apply 4 g topically daily as needed (pain).) 100 g 11   furosemide  (LASIX ) 20 MG tablet Take 2 tablets (40 mg total) by mouth daily. 180 tablet 3   gabapentin  (NEURONTIN ) 600 MG tablet TAKE 2 TABLETS BY MOUTH THREE TIMES DAILY 180 tablet 5   Glucagon  (BAQSIMI  ONE PACK) 3 MG/DOSE POWD Place 1  Device into the nose as needed (Low blood sugar with impaired consciousness). 2 each 3   halobetasol (ULTRAVATE) 0.05 % ointment Apply 1 Application topically 3 (three) times a week.     insulin  glargine (LANTUS  SOLOSTAR) 100 UNIT/ML Solostar Pen Inject 25 Units into the skin daily. 15 mL 1   insulin  lispro (HUMALOG ) 100 UNIT/ML KwikPen Inject 15 units with break fast and 15 units with lunch, 20-22 with supper 15 min before meals (Patient taking differently: 15-22 Units See admin instructions. Inject 15 units with break fast and 15 units with lunch, 20-22 with supper 15 min before meals Sliding scale) 30 mL 2   Insulin  Pen Needle 31G X 8 MM MISC 1 Needle by Does not apply route QID. 100 each 3   levocetirizine (XYZAL ) 5 MG tablet TAKE 1 TABLET EVERY EVENING (Patient taking differently: Take 5 mg by mouth in the morning.) 90 tablet 3   levothyroxine  (SYNTHROID ) 125 MCG tablet Take 1 tablet by mouth once daily 90 tablet 0   lidocaine  (LIDODERM ) 5 % PLACE 1 PATCH ONTO THE SKIN DAILY. REMOVE & DISCARD PATCH WITHIN 12 HOURS OR AS DIRECTED  BY MD (Patient taking differently: Place 1 patch onto the skin daily as needed (pain). Remove & Discard patch within 12 hours or as directed by MD) 30 patch 0   lipase/protease/amylase (CREON ) 36000 UNITS CPEP capsule Take 2 capsules by mouth with meals and 1 with snack. 210 capsule 3   Magnesium 200 MG TABS Take 600 tablets by mouth daily as needed (foot cramps).     methocarbamol (ROBAXIN) 750 MG tablet Take 750 mg by mouth every 6 (six) hours as needed.     Multiple Vitamin (MULTIVITAMIN) tablet Take 1 tablet by mouth in the morning and at bedtime.     nitroGLYCERIN  (NITROSTAT ) 0.4 MG SL tablet Place 1 tablet (0.4 mg total) under the tongue every 5 (five) minutes as needed for chest pain. 25 tablet 4   pantoprazole  (PROTONIX ) 40 MG tablet Take 1 tablet by mouth twice daily 180 tablet 0   phenazopyridine  (PYRIDIUM ) 100 MG tablet Take 1 tablet (100 mg total) by mouth 3  (three) times daily with meals. 9 tablet 0   potassium chloride  SA (KLOR-CON  M) 20 MEQ tablet Take 1 tablet (20 mEq total) by mouth daily. 90 tablet 3   tirzepatide  (MOUNJARO ) 5 MG/0.5ML Pen Inject 5 mg into the skin once a week. 2 mL 1   traMADol  (ULTRAM ) 50 MG tablet Take 50 mg by mouth every 6 (six) hours as needed (pain).     triamcinolone cream (KENALOG) 0.1 % Apply 1 Application topically 2 (two) times daily.     No facility-administered medications prior to visit.     Review of Systems: as per hpi  Constitutional:   No  weight loss, night sweats,  Fevers, chills, fatigue, or  lassitude.  HEENT:   No headaches,  Difficulty swallowing,  Tooth/dental problems, or  Sore throat,                No sneezing, itching, ear ache, nasal congestion, post nasal drip,   CV:  No chest pain,  Orthopnea, PND, swelling in lower extremities, anasarca, dizziness, palpitations, syncope.   GI  No heartburn, indigestion, abdominal pain, nausea, vomiting, diarrhea, change in bowel habits, loss of appetite, bloody stools.   Resp: No shortness of breath with exertion or at rest.  No excess mucus, no productive cough,  No non-productive cough,  No coughing up of blood.  No change in color of mucus.  No wheezing.  No chest wall deformity  Skin: no rash or lesions.  GU: no dysuria, change in color of urine, no urgency or frequency.  No flank pain, no hematuria   MS:  No joint pain or swelling.  No decreased range of motion.  No back pain.    Physical Exam  BP 113/74   Pulse 79   Wt 208 lb (94.3 kg)   SpO2 98%   BMI 29.01 kg/m   GEN: A/Ox3; pleasant , NAD, well nourished    HEENT:  Story/AT,  EACs-clear, TMs-wnl, NOSE-clear, THROAT-clear, no lesions, no postnasal drip or exudate noted. Mallampati 3  NECK:  Supple w/ fair ROM; no JVD; normal carotid impulses w/o bruits; no thyromegaly or nodules palpated; no lymphadenopathy.    RESP  Clear  P & A; w/o, wheezes/ rales/ or rhonchi. no accessory  muscle use, no dullness to percussion  CARD:  RRR, no m/r/g, no peripheral edema, pulses intact, no cyanosis or clubbing.  GI:   Soft & nt; nml bowel sounds; no organomegaly or masses detected.   Musco: Warm bil,  no deformities or joint swelling noted.   Neuro: alert, no focal deficits noted.    Skin: Warm, no lesions or rashes    Lab Results:  CBC    Component Value Date/Time   WBC 3.6 (L) 09/21/2024 1358   WBC 4.7 05/07/2024 1243   RBC 3.66 (L) 09/21/2024 1358   HGB 11.7 (L) 09/21/2024 1358   HGB 12.0 07/27/2024 1056   HCT 35.7 (L) 09/21/2024 1358   HCT 38.0 07/27/2024 1056   PLT 174 09/21/2024 1358   PLT 196 07/27/2024 1056   MCV 97.5 09/21/2024 1358   MCV 97 07/27/2024 1056   MCH 32.0 09/21/2024 1358   MCHC 32.8 09/21/2024 1358   RDW 15.1 09/21/2024 1358   RDW 15.1 07/27/2024 1056   LYMPHSABS 1.1 09/21/2024 1358   LYMPHSABS 1.2 05/01/2022 0943   MONOABS 0.4 09/21/2024 1358   EOSABS 0.3 09/21/2024 1358   EOSABS 0.3 05/01/2022 0943   BASOSABS 0.0 09/21/2024 1358   BASOSABS 0.0 05/01/2022 0943    BMET    Component Value Date/Time   NA 142 08/06/2024 1148   NA 141 07/27/2024 1056   K 4.0 08/06/2024 1148   CL 105 07/27/2024 1056   CO2 20 07/27/2024 1056   GLUCOSE 113 (H) 07/27/2024 1056   GLUCOSE 138 (H) 07/06/2024 0756   BUN 32 (H) 07/27/2024 1056   CREATININE 1.62 (H) 07/27/2024 1056   CREATININE 1.51 (H) 02/22/2023 1513   CALCIUM  8.7 07/27/2024 1056   CALCIUM  8.9 01/30/2013 1017   GFRNONAA >60 05/13/2021 0645   GFRNONAA 63 08/16/2020 1207   GFRAA 73 08/16/2020 1207    BNP    Component Value Date/Time   BNP 25.1 05/01/2022 0943    ProBNP    Component Value Date/Time   PROBNP 86.0 06/07/2017 1121    Imaging: No results found.  Administration History     None          Latest Ref Rng & Units 06/26/2022    2:39 PM  PFT Results  FVC-Pre L 3.66   FVC-Predicted Pre % 92   FVC-Post L 3.74   FVC-Predicted Post % 94   Pre FEV1/FVC % %  71   Post FEV1/FCV % % 72   FEV1-Pre L 2.61   FEV1-Predicted Pre % 86   FEV1-Post L 2.69   DLCO uncorrected ml/min/mmHg 21.78   DLCO UNC% % 88   DLCO corrected ml/min/mmHg 21.78   DLCO COR %Predicted % 88   DLVA Predicted % 107   TLC L 5.81   TLC % Predicted % 95   RV % Predicted % 84     No results found for: NITRICOXIDE   Assessment & Plan:   Assessment & Plan Excessive daytime sleepiness  Assessment and Plan Assessment & Plan Suspected obstructive sleep apnea Chronic daytime sleepiness and fatigue suggest obstructive sleep apnea. Home sleep test preferred due to irregular sleep schedule. - Ordered home sleep test. - Scheduled follow-up in six weeks to discuss results. - Consider in-lab sleep study if home test inconclusive.    Return in about 6 weeks (around 01/27/2025) for sleep study review.  Julyan Gales, PA-C 12/16/2024      [1]  Allergies Allergen Reactions   Ace Inhibitors Cough   Caffeine     PVCs   Ciprofloxacin Itching   Morphine  And Codeine Itching    Pt tolerates medication if given with diphenhydramine    Mucinex [Guaifenesin Er]     PVCs   Nsaids  Gastric Bypass Surgery - unable to take, tolerates ec 81 aspirin     Silicone Hives    Unsure   Tape Hives  [2]  Social History Tobacco Use  Smoking Status Former   Current packs/day: 0.00   Average packs/day: 1.5 packs/day for 15.0 years (22.5 ttl pk-yrs)   Types: Cigarettes   Start date: 49   Quit date: 45   Years since quitting: 34.1   Passive exposure: Never  Smokeless Tobacco Never   "

## 2024-12-16 NOTE — Progress Notes (Signed)
 Epworth Sleepiness Scale  Use the following scale to choose the most appropriate number for each situation. 0 Would never nod off 1  Slight  chance of nodding off 2 Moderate chance of nodding off 3 High chance of nodding off  Sitting and reading: 3 Watching TV: 3 Sitting, inactive, in a public place (e.g., in a meeting, theater, or dinner event): 3 As a passenger in a car for an hour or more without stopping for a break: 3 Lying down to rest when circumstances permit:3 Sitting and talking to someone: 1 Sitting quietly after a meal without alc1ohol: 3 In a car, while stopped for a few  minutes in traffic or at a light: 2  TOTOAL: 21

## 2024-12-16 NOTE — Progress Notes (Deleted)
 ep

## 2024-12-24 ENCOUNTER — Inpatient Hospital Stay

## 2024-12-24 ENCOUNTER — Inpatient Hospital Stay: Admitting: Physician Assistant

## 2025-01-06 ENCOUNTER — Ambulatory Visit: Admitting: Internal Medicine

## 2025-01-07 ENCOUNTER — Ambulatory Visit: Admitting: "Endocrinology

## 2025-01-27 ENCOUNTER — Ambulatory Visit (HOSPITAL_BASED_OUTPATIENT_CLINIC_OR_DEPARTMENT_OTHER)

## 2025-02-02 ENCOUNTER — Ambulatory Visit: Admitting: Internal Medicine
